# Patient Record
Sex: Female | Born: 1940 | Race: White | Hispanic: No | Smoking: Former smoker
Health system: Southern US, Community
[De-identification: ages and names within clinical notes are randomized; demographics above are authoritative.]

## PROBLEM LIST (undated history)

## (undated) DIAGNOSIS — C50919 Malignant neoplasm of unspecified site of unspecified female breast: Secondary | ICD-10-CM

## (undated) DIAGNOSIS — I4892 Unspecified atrial flutter: Secondary | ICD-10-CM

## (undated) DIAGNOSIS — I214 Non-ST elevation (NSTEMI) myocardial infarction: Secondary | ICD-10-CM

## (undated) DIAGNOSIS — I499 Cardiac arrhythmia, unspecified: Secondary | ICD-10-CM

## (undated) DIAGNOSIS — Z95820 Peripheral vascular angioplasty status with implants and grafts: Secondary | ICD-10-CM

## (undated) DIAGNOSIS — I2510 Atherosclerotic heart disease of native coronary artery without angina pectoris: Secondary | ICD-10-CM

## (undated) DIAGNOSIS — I10 Essential (primary) hypertension: Secondary | ICD-10-CM

## (undated) DIAGNOSIS — I4891 Unspecified atrial fibrillation: Secondary | ICD-10-CM

## (undated) DIAGNOSIS — J449 Chronic obstructive pulmonary disease, unspecified: Secondary | ICD-10-CM

## (undated) HISTORY — PX: EYE SURGERY: SHX253

## (undated) HISTORY — PX: APPENDECTOMY: SHX54

## (undated) HISTORY — PX: COLONOSCOPY: SHX174

## (undated) HISTORY — PX: BREAST SURGERY: SHX581

---

## 2013-08-02 ENCOUNTER — Emergency Department (HOSPITAL_COMMUNITY): Payer: Medicare Other

## 2013-08-02 ENCOUNTER — Emergency Department (HOSPITAL_COMMUNITY)
Admission: EM | Admit: 2013-08-02 | Discharge: 2013-08-03 | Disposition: A | Discharge status: Home / Self Care | Payer: Medicare Other | Attending: Emergency Medicine | Admitting: Emergency Medicine

## 2013-08-02 ENCOUNTER — Encounter (HOSPITAL_COMMUNITY): Payer: Self-pay | Admitting: Emergency Medicine

## 2013-08-02 DIAGNOSIS — J449 Chronic obstructive pulmonary disease, unspecified: Secondary | ICD-10-CM

## 2013-08-02 DIAGNOSIS — Z853 Personal history of malignant neoplasm of breast: Secondary | ICD-10-CM | POA: Insufficient documentation

## 2013-08-02 DIAGNOSIS — I10 Essential (primary) hypertension: Secondary | ICD-10-CM | POA: Insufficient documentation

## 2013-08-02 DIAGNOSIS — Z792 Long term (current) use of antibiotics: Secondary | ICD-10-CM | POA: Insufficient documentation

## 2013-08-02 DIAGNOSIS — J441 Chronic obstructive pulmonary disease with (acute) exacerbation: Secondary | ICD-10-CM | POA: Insufficient documentation

## 2013-08-02 DIAGNOSIS — F17200 Nicotine dependence, unspecified, uncomplicated: Secondary | ICD-10-CM | POA: Insufficient documentation

## 2013-08-02 DIAGNOSIS — Z791 Long term (current) use of non-steroidal anti-inflammatories (NSAID): Secondary | ICD-10-CM | POA: Insufficient documentation

## 2013-08-02 DIAGNOSIS — Z79899 Other long term (current) drug therapy: Secondary | ICD-10-CM | POA: Insufficient documentation

## 2013-08-02 HISTORY — DX: Malignant neoplasm of unspecified site of unspecified female breast: C50.919

## 2013-08-02 HISTORY — DX: Essential (primary) hypertension: I10

## 2013-08-02 LAB — CBC WITH DIFFERENTIAL/PLATELET
Basophils Absolute: 0 10*3/uL (ref 0.0–0.1)
Basophils Relative: 1 % (ref 0–1)
Eosinophils Absolute: 0.3 10*3/uL (ref 0.0–0.7)
Eosinophils Relative: 5 % (ref 0–5)
HCT: 40.3 % (ref 36.0–46.0)
Hemoglobin: 13.8 g/dL (ref 12.0–15.0)
Lymphocytes Relative: 29 % (ref 12–46)
Lymphs Abs: 1.8 10*3/uL (ref 0.7–4.0)
MCH: 30.7 pg (ref 26.0–34.0)
MCHC: 34.2 g/dL (ref 30.0–36.0)
MCV: 89.8 fL (ref 78.0–100.0)
Monocytes Absolute: 0.5 10*3/uL (ref 0.1–1.0)
Monocytes Relative: 7 % (ref 3–12)
Neutro Abs: 3.6 10*3/uL (ref 1.7–7.7)
Neutrophils Relative %: 58 % (ref 43–77)
Platelets: 183 10*3/uL (ref 150–400)
RBC: 4.49 MIL/uL (ref 3.87–5.11)
RDW: 13.3 % (ref 11.5–15.5)
WBC: 6.3 10*3/uL (ref 4.0–10.5)

## 2013-08-02 LAB — BASIC METABOLIC PANEL
BUN: 22 mg/dL (ref 6–23)
CO2: 21 mEq/L (ref 19–32)
Calcium: 8.9 mg/dL (ref 8.4–10.5)
Chloride: 101 mEq/L (ref 96–112)
Creatinine, Ser: 1 mg/dL (ref 0.50–1.10)
GFR calc Af Amer: 65 mL/min — ABNORMAL LOW (ref >90)
GFR calc non Af Amer: 56 mL/min — ABNORMAL LOW (ref >90)
Glucose, Bld: 130 mg/dL — ABNORMAL HIGH (ref 70–99)
Potassium: 3.9 mEq/L (ref 3.7–5.3)
Sodium: 134 mEq/L — ABNORMAL LOW (ref 137–147)

## 2013-08-02 MED ORDER — ALBUTEROL SULFATE (2.5 MG/3ML) 0.083% IN NEBU: 2.5000 mg | INHALATION_SOLUTION | Freq: Once | RESPIRATORY_TRACT | Status: DC

## 2013-08-02 MED ORDER — PREDNISONE 50 MG PO TABS
60.0000 mg | ORAL_TABLET | Freq: Once | ORAL | Status: AC
  Administered 2013-08-02: 60 mg via ORAL
  Filled 2013-08-02 (×2): qty 1

## 2013-08-02 MED ORDER — IPRATROPIUM BROMIDE 0.02 % IN SOLN: 0.5000 mg | Freq: Once | RESPIRATORY_TRACT | Status: DC

## 2013-08-02 MED ORDER — IPRATROPIUM-ALBUTEROL 0.5-2.5 (3) MG/3ML IN SOLN
3.0000 mL | Freq: Once | RESPIRATORY_TRACT | Status: AC
  Administered 2013-08-02: 3 mL via RESPIRATORY_TRACT
  Filled 2013-08-02: qty 3

## 2013-08-02 MED ORDER — ALBUTEROL (5 MG/ML) CONTINUOUS INHALATION SOLN
10.0000 mg/h | INHALATION_SOLUTION | Freq: Once | RESPIRATORY_TRACT | Status: AC
  Administered 2013-08-03: 10 mg/h via RESPIRATORY_TRACT
  Filled 2013-08-02: qty 20

## 2013-08-02 NOTE — ED Provider Notes (Signed)
CSN: [id number]     Arrival date & time 08/02/13  2202 History  This chart was scribed for Sharyon Cable, MD by Ladene Artist, ED Scribe. The patient was seen in room APA03/APA03. Patient's care was started at 11:14 PM.    Chief Complaint  Patient presents with  . Cough  . Shortness of Breath    Patient is a 73 y.o. female presenting with cough and shortness of breath. The history is provided by the patient. No language interpreter was used.  Cough Cough characteristics:  Unable to specify Severity:  Moderate Onset quality:  Gradual Duration:  1 week Progression:  Worsening Chronicity:  Recurrent Smoker: yes   Ineffective treatments:  None tried Associated symptoms: shortness of breath   Associated symptoms: no chest pain and no fever   Shortness of Breath Associated symptoms: cough   Associated symptoms: no chest pain, no fever and no vomiting    HPI Comments: Patricia Jackson is a 72 y.o. female who presents to the Emergency Department complaining of gradually worsening cough since last week. Pt also reports associated SOB. Pt denies fever, vomiting, chest pain, hemoptysis and leg swelling. Pt was given a breathing treatment with mild relief. Pt is currently taking a Zpak.   Pt is a smoker. She reports quitting today.  No h/o asthma or COPD.  PCP Delphina Cahill; last seen Tuesday.  Past Medical History  Diagnosis Date  . Hypertension   . Breast cancer    Past Surgical History  Procedure Laterality Date  . Appendectomy    . Breast surgery     No family history on file. History  Substance Use Topics  . Smoking status: Current Every Day Smoker  . Smokeless tobacco: Not on file  . Alcohol Use: No   OB History   Grav Para Term Preterm Abortions TAB SAB Ect Mult Living                 Review of Systems  Constitutional: Negative for fever.  Respiratory: Positive for cough and shortness of breath.   Cardiovascular: Negative for chest pain and leg swelling.   Gastrointestinal: Negative for vomiting.  All other systems reviewed and are negative.   Allergies  Sulfa antibiotics  Home Medications   Prior to Admission medications   Medication Sig Start Date End Date Taking? Authorizing Provider  amLODipine (NORVASC) 10 MG tablet Take 10 mg by mouth daily. 06/26/13  Yes Historical Provider, MD  azithromycin (ZITHROMAX) 250 MG tablet Take 250-500 mg by mouth daily. Take 2 tablets on day 1 and 1 tablet on days 2-5 07/31/13  Yes Historical Provider, MD  butalbital-acetaminophen-caffeine (FIORICET, ESGIC) 50-325-40 MG per tablet Take 1 tablet by mouth 2 (two) times daily as needed for headache.   Yes Historical Provider, MD  dextromethorphan-guaiFENesin (MUCINEX DM) 30-600 MG per 12 hr tablet Take 1 tablet by mouth 2 (two) times daily.   Yes Historical Provider, MD  HYDROcodone-homatropine (HYCODAN) 5-1.5 MG/5ML syrup Take 5 mLs by mouth every 6 (six) hours as needed for cough.   Yes Historical Provider, MD  losartan (COZAAR) 100 MG tablet Take 100 mg by mouth daily. 07/24/13  Yes Historical Provider, MD  oxyCODONE-acetaminophen (PERCOCET/ROXICET) 5-325 MG per tablet Take 1 tablet by mouth every 6 (six) hours as needed. 06/27/13  Yes Historical Provider, MD   Triage Vitals: BP 150/59  Pulse 74  Temp(Src) 97.6 F (36.4 C) (Oral)  Resp 24  Ht 5\' 6"  (1.676 m)  Wt 145 lb (65.772  kg)  BMI 23.41 kg/m2  SpO2 92% Physical Exam CONSTITUTIONAL: Well developed/well nourished HEAD: Normocephalic/atraumatic EYES: EOMI/PERRL ENMT: Mucous membranes moist NECK: supple no meningeal signs SPINE:entire spine nontender CV: S1/S2 noted, no murmurs/rubs/gallops noted LUNGS: no apparent distress, wheezing bilaterally ABDOMEN: soft, nontender, no rebound or guarding GU:no cva tenderness NEURO: Pt is awake/alert, moves all extremitiesx4 EXTREMITIES: pulses normal, full ROM SKIN: warm, color normal PSYCH: no abnormalities of mood noted  ED Course   Procedures DIAGNOSTIC STUDIES: Oxygen Saturation is 95% on RA, adequate by my interpretation.    COORDINATION OF CARE: 11:16 PM-Discussed treatment plan which includes CXR with pt at bedside and pt agreed to plan.  1:32 AM Pt improved, ambulatory, no distress Stable for d/c home Labs Review Labs Reviewed  BASIC METABOLIC PANEL - Abnormal; Notable for the following:    Sodium 134 (*)    Glucose, Bld 130 (*)    GFR calc non Af Amer 56 (*)    GFR calc Af Amer 65 (*)    All other components within normal limits  CBC WITH DIFFERENTIAL    Imaging Review Dg Chest 2 View  08/03/2013   CLINICAL DATA:  Cough and shortness of breath.  History of smoking.  EXAM: CHEST  2 VIEW  COMPARISON:  None.  FINDINGS: The lungs are hyperexpanded, with flattening of the hemidiaphragms, compatible with COPD. There is no evidence of focal opacification, pleural effusion or pneumothorax. A few tiny calcified granulomata are seen at the right lung apex.  The heart is normal in size; the mediastinal contour is within normal limits. No acute osseous abnormalities are seen. Clips are seen overlying the left axilla.  IMPRESSION: Findings of COPD; no acute cardiopulmonary process seen.   Electronically Signed   By: Garald Balding M.D.   On: 08/03/2013 00:53     Date: 08/02/2013  Rate: 68  Rhythm: normal sinus rhythm  QRS Axis: normal  Intervals: normal  ST/T Wave abnormalities: normal  Conduction Disutrbances:none  Narrative Interpretation:   Old EKG Reviewed: none available    MDM   Final diagnoses:  COPD (chronic obstructive pulmonary disease)    Nursing notes including past medical history and social history reviewed and considered in documentation xrays reviewed and considered Labs/vital reviewed and considered   I personally performed the services described in this documentation, which was scribed in my presence. The recorded information has been reviewed and is accurate.    Sharyon Cable, MD 08/03/13 [phone]

## 2013-08-02 NOTE — ED Notes (Signed)
Pt reports seeing her PCP last week for bronchitis. States the cough seems to be getting worse over the last couple of days, making her SOB. Reports coughing up small amounts of white phlegm.

## 2013-08-02 NOTE — ED Notes (Signed)
Pt reporting cough and SOB.  Reports that she saw her PCP on Tuesday, diagnosed with bronchitis and was given cough syrup, antibiotics and instructed to take Mucinex.  Pt reports no improvement since that time.  Mild expiratory wheezes noted in right lung fields.

## 2013-08-03 MED ORDER — AEROCHAMBER PLUS FLO-VU MEDIUM MISC
1.0000 | Freq: Once | Status: AC
  Administered 2013-08-03: 1

## 2013-08-03 MED ORDER — PREDNISONE 50 MG PO TABS: ORAL_TABLET | ORAL | Status: DC

## 2013-08-03 MED ORDER — ALBUTEROL SULFATE HFA 108 (90 BASE) MCG/ACT IN AERS
2.0000 | INHALATION_SPRAY | Freq: Once | RESPIRATORY_TRACT | Status: AC
  Administered 2013-08-03: 2 via RESPIRATORY_TRACT
  Filled 2013-08-03: qty 6.7

## 2013-08-03 NOTE — Discharge Instructions (Signed)
Chronic Obstructive Pulmonary Disease  Chronic obstructive pulmonary disease (COPD) is a common lung condition in which airflow from the lungs is limited. COPD is a general term that can be used to describe many different lung problems that limit airflow, including both chronic bronchitis and emphysema.  If you have COPD, your lung function will probably never return to normal, but there are measures you can take to improve lung function and make yourself feel better.   CAUSES   · Smoking (common).    · Exposure to secondhand smoke.    · Genetic problems.  · Chronic inflammatory lung diseases or recurrent infections.  SYMPTOMS   · Shortness of breath, especially with physical activity.    · Deep, persistent (chronic) cough with a large amount of thick mucus.    · Wheezing.    · Rapid breaths (tachypnea).    · Gray or bluish discoloration (cyanosis) of the skin, especially in fingers, toes, or lips.    · Fatigue.    · Weight loss.    · Frequent infections or episodes when breathing symptoms become much worse (exacerbations).    · Chest tightness.  DIAGNOSIS   Your healthcare provider will take a medical history and perform a physical examination to make the initial diagnosis.  Additional tests for COPD may include:   · Lung (pulmonary) function tests.  · Chest X-ray.  · CT scan.  · Blood tests.  TREATMENT   Treatment available to help you feel better when you have COPD include:   · Inhaler and nebulizer medicines. These help manage the symptoms of COPD and make your breathing more comfortable  · Supplemental oxygen. Supplemental oxygen is only helpful if you have a low oxygen level in your blood.    · Exercise and physical activity. These are beneficial for nearly all people with COPD. Some people may also benefit from a pulmonary rehabilitation program.  HOME CARE INSTRUCTIONS   · Take all medicines (inhaled or pills) as directed by your health care provider.  · Only take over-the-counter or prescription medicines  for pain, fever, or discomfort as directed by your health care provider.    · Avoid over-the-counter medicines or cough syrups that dry up your airway (such as antihistamines) and slow down the elimination of secretions unless instructed otherwise by your healthcare provider.    · If you are a smoker, the most important thing that you can do is stop smoking. Continuing to smoke will cause further lung damage and breathing trouble. Ask your health care provider for help with quitting smoking. He or she can direct you to community resources or hospitals that provide support.  · Avoid exposure to irritants such as smoke, chemicals, and fumes that aggravate your breathing.  · Use oxygen therapy and pulmonary rehabilitation if directed by your health care provider. If you require home oxygen therapy, ask your healthcare provider whether you should purchase a pulse oximeter to measure your oxygen level at home.    · Avoid contact with individuals who have a contagious illness.  · Avoid extreme temperature and humidity changes.  · Eat healthy foods. Eating smaller, more frequent meals and resting before meals may help you maintain your strength.  · Stay active, but balance activity with periods of rest. Exercise and physical activity will help you maintain your ability to do things you want to do.  · Preventing infection and hospitalization is very important when you have COPD. Make sure to receive all the vaccines your health care provider recommends, especially the pneumococcal and influenza vaccines. Ask your healthcare provider whether you   need a pneumonia vaccine.  · Learn and use relaxation techniques to manage stress.  · Learn and use controlled breathing techniques as directed by your health care provider. Controlled breathing techniques include:    · Pursed lip breathing. Start by breathing in (inhaling) through your nose for 1 second. Then, purse your lips as if you were going to whistle and breathe out (exhale)  through the pursed lips for 2 seconds.    · Diaphragmatic breathing. Start by putting one hand on your abdomen just above your waist. Inhale slowly through your nose. The hand on your abdomen should move out. Then purse your lips and exhale slowly. You should be able to feel the hand on your abdomen moving in as you exhale.    · Learn and use controlled coughing to clear mucus from your lungs. Controlled coughing is a series of short, progressive coughs. The steps of controlled coughing are:    1. Lean your head slightly forward.    2. Breathe in deeply using diaphragmatic breathing.    3. Try to hold your breath for 3 seconds.    4. Keep your mouth slightly open while coughing twice.    5. Spit any mucus out into a tissue.    6. Rest and repeat the steps once or twice as needed.  SEEK MEDICAL CARE IF:   · You are coughing up more mucus than usual.    · There is a change in the color or thickness of your mucus.    · Your breathing is more labored than usual.    · Your breathing is faster than usual.    SEEK IMMEDIATE MEDICAL CARE IF:   · You have shortness of breath while you are resting.    · You have shortness of breath that prevents you from:  · Being able to talk.    · Performing your usual physical activities.    · You have chest pain lasting longer than 5 minutes.    · Your skin color is more cyanotic than usual.  · You measure low oxygen saturations for longer than 5 minutes with a pulse oximeter.  MAKE SURE YOU:   · Understand these instructions.  · Will watch your condition.  · Will get help right away if you are not doing well or get worse.  Document Released: 12/30/2004 Document Revised: 01/10/2013 Document Reviewed: 11/16/2012  [employer]® Patient Information ©2014 [employer].

## 2013-08-03 NOTE — ED Notes (Signed)
Pt ambulated around nurses station with no difficulty.  No complaints of dizziness or increased SOB.

## 2013-11-21 ENCOUNTER — Other Ambulatory Visit (HOSPITAL_COMMUNITY): Payer: Self-pay | Admitting: Respiratory Therapy

## 2013-11-21 DIAGNOSIS — J441 Chronic obstructive pulmonary disease with (acute) exacerbation: Secondary | ICD-10-CM

## 2013-11-26 ENCOUNTER — Encounter (HOSPITAL_COMMUNITY): Payer: Self-pay | Admitting: Emergency Medicine

## 2013-11-26 ENCOUNTER — Emergency Department (HOSPITAL_COMMUNITY): Payer: Medicare Other

## 2013-11-26 ENCOUNTER — Observation Stay (HOSPITAL_COMMUNITY)
Admission: EM | Admit: 2013-11-26 | Discharge: 2013-11-26 | Disposition: A | Discharge status: Home / Self Care | Payer: Medicare Other | Attending: Family Medicine | Admitting: Family Medicine

## 2013-11-26 DIAGNOSIS — R079 Chest pain, unspecified: Secondary | ICD-10-CM | POA: Diagnosis present

## 2013-11-26 DIAGNOSIS — R0609 Other forms of dyspnea: Secondary | ICD-10-CM

## 2013-11-26 DIAGNOSIS — M546 Pain in thoracic spine: Secondary | ICD-10-CM | POA: Diagnosis not present

## 2013-11-26 DIAGNOSIS — I059 Rheumatic mitral valve disease, unspecified: Secondary | ICD-10-CM

## 2013-11-26 DIAGNOSIS — R0602 Shortness of breath: Secondary | ICD-10-CM | POA: Diagnosis present

## 2013-11-26 DIAGNOSIS — F17200 Nicotine dependence, unspecified, uncomplicated: Secondary | ICD-10-CM | POA: Diagnosis not present

## 2013-11-26 DIAGNOSIS — R0600 Dyspnea, unspecified: Secondary | ICD-10-CM

## 2013-11-26 DIAGNOSIS — M79601 Pain in right arm: Secondary | ICD-10-CM

## 2013-11-26 DIAGNOSIS — Z79899 Other long term (current) drug therapy: Secondary | ICD-10-CM | POA: Diagnosis not present

## 2013-11-26 DIAGNOSIS — M549 Dorsalgia, unspecified: Secondary | ICD-10-CM

## 2013-11-26 DIAGNOSIS — I10 Essential (primary) hypertension: Secondary | ICD-10-CM

## 2013-11-26 DIAGNOSIS — M25519 Pain in unspecified shoulder: Secondary | ICD-10-CM | POA: Diagnosis not present

## 2013-11-26 DIAGNOSIS — J441 Chronic obstructive pulmonary disease with (acute) exacerbation: Secondary | ICD-10-CM | POA: Insufficient documentation

## 2013-11-26 DIAGNOSIS — R42 Dizziness and giddiness: Secondary | ICD-10-CM | POA: Insufficient documentation

## 2013-11-26 DIAGNOSIS — R0989 Other specified symptoms and signs involving the circulatory and respiratory systems: Secondary | ICD-10-CM

## 2013-11-26 DIAGNOSIS — M79609 Pain in unspecified limb: Secondary | ICD-10-CM

## 2013-11-26 DIAGNOSIS — M79603 Pain in arm, unspecified: Secondary | ICD-10-CM

## 2013-11-26 DIAGNOSIS — Z853 Personal history of malignant neoplasm of breast: Secondary | ICD-10-CM | POA: Diagnosis not present

## 2013-11-26 LAB — COMPREHENSIVE METABOLIC PANEL
ALT: 11 U/L (ref 0–35)
AST: 29 U/L (ref 0–37)
Albumin: 4 g/dL (ref 3.5–5.2)
Alkaline Phosphatase: 86 U/L (ref 39–117)
Anion gap: 15 (ref 5–15)
BUN: 25 mg/dL — ABNORMAL HIGH (ref 6–23)
CO2: 21 mEq/L (ref 19–32)
Calcium: 9.6 mg/dL (ref 8.4–10.5)
Chloride: 102 mEq/L (ref 96–112)
Creatinine, Ser: 1.03 mg/dL (ref 0.50–1.10)
GFR calc Af Amer: 62 mL/min — ABNORMAL LOW (ref >90)
GFR calc non Af Amer: 54 mL/min — ABNORMAL LOW (ref >90)
Glucose, Bld: 119 mg/dL — ABNORMAL HIGH (ref 70–99)
Potassium: 4.1 mEq/L (ref 3.7–5.3)
Sodium: 138 mEq/L (ref 137–147)
Total Bilirubin: 0.3 mg/dL (ref 0.3–1.2)
Total Protein: 7.8 g/dL (ref 6.0–8.3)

## 2013-11-26 LAB — CBC WITH DIFFERENTIAL/PLATELET
Basophils Absolute: 0 10*3/uL (ref 0.0–0.1)
Basophils Relative: 1 % (ref 0–1)
Eosinophils Absolute: 0.4 10*3/uL (ref 0.0–0.7)
Eosinophils Relative: 6 % — ABNORMAL HIGH (ref 0–5)
HCT: 44.1 % (ref 36.0–46.0)
Hemoglobin: 15.4 g/dL — ABNORMAL HIGH (ref 12.0–15.0)
Lymphocytes Relative: 37 % (ref 12–46)
Lymphs Abs: 2.7 10*3/uL (ref 0.7–4.0)
MCH: 31.4 pg (ref 26.0–34.0)
MCHC: 34.9 g/dL (ref 30.0–36.0)
MCV: 90 fL (ref 78.0–100.0)
Monocytes Absolute: 0.5 10*3/uL (ref 0.1–1.0)
Monocytes Relative: 7 % (ref 3–12)
Neutro Abs: 3.7 10*3/uL (ref 1.7–7.7)
Neutrophils Relative %: 49 % (ref 43–77)
Platelets: 183 10*3/uL (ref 150–400)
RBC: 4.9 MIL/uL (ref 3.87–5.11)
RDW: 14.5 % (ref 11.5–15.5)
WBC: 7.4 10*3/uL (ref 4.0–10.5)

## 2013-11-26 LAB — TROPONIN I
Troponin I: <0.3 ng/mL (ref <0.30)
Troponin I: <0.3 ng/mL (ref <0.30)
Troponin I: <0.3 ng/mL (ref <0.30)
Troponin I: <0.3 ng/mL (ref <0.30)

## 2013-11-26 LAB — PRO B NATRIURETIC PEPTIDE: Pro B Natriuretic peptide (BNP): 119.5 pg/mL (ref 0–125)

## 2013-11-26 MED ORDER — ASPIRIN EC 325 MG PO TBEC
325.0000 mg | DELAYED_RELEASE_TABLET | Freq: Every day | ORAL | Status: DC
  Filled 2013-11-26: qty 1

## 2013-11-26 MED ORDER — FENTANYL CITRATE 0.05 MG/ML IJ SOLN
50.0000 ug | INTRAMUSCULAR | Status: DC | PRN
  Administered 2013-11-26: 50 ug via INTRAVENOUS
  Filled 2013-11-26: qty 2

## 2013-11-26 MED ORDER — SODIUM CHLORIDE 0.9 % IV BOLUS (SEPSIS)
500.0000 mL | Freq: Once | INTRAVENOUS | Status: AC
  Administered 2013-11-26: 1000 mL via INTRAVENOUS

## 2013-11-26 MED ORDER — ASPIRIN 325 MG PO TBEC: 325.0000 mg | DELAYED_RELEASE_TABLET | Freq: Every day | ORAL | Status: DC

## 2013-11-26 MED ORDER — GI COCKTAIL ~~LOC~~: 30.0000 mL | Freq: Four times a day (QID) | ORAL | Status: DC | PRN

## 2013-11-26 MED ORDER — ASPIRIN 81 MG PO CHEW
324.0000 mg | CHEWABLE_TABLET | Freq: Once | ORAL | Status: AC
  Administered 2013-11-26: 324 mg via ORAL
  Filled 2013-11-26: qty 4

## 2013-11-26 MED ORDER — ONDANSETRON HCL 4 MG/2ML IJ SOLN: 4.0000 mg | Freq: Four times a day (QID) | INTRAMUSCULAR | Status: DC | PRN

## 2013-11-26 MED ORDER — MORPHINE SULFATE 4 MG/ML IJ SOLN: 4.0000 mg | Freq: Once | INTRAMUSCULAR | Status: DC

## 2013-11-26 MED ORDER — IOHEXOL 350 MG/ML SOLN
100.0000 mL | Freq: Once | INTRAVENOUS | Status: AC | PRN
  Administered 2013-11-26: 100 mL via INTRAVENOUS

## 2013-11-26 MED ORDER — ACETAMINOPHEN 325 MG PO TABS: 650.0000 mg | ORAL_TABLET | ORAL | Status: DC | PRN

## 2013-11-26 MED ORDER — ONDANSETRON HCL 4 MG/2ML IJ SOLN: 4.0000 mg | Freq: Three times a day (TID) | INTRAMUSCULAR | Status: DC | PRN

## 2013-11-26 NOTE — Progress Notes (Signed)
Patients home medications not ordered. Dyanne Carrel notified.

## 2013-11-26 NOTE — Discharge Summary (Signed)
Physician Discharge Summary  Patricia Jackson [id number] DOB: 1941/03/27 DOA: 11/26/2013  PCP: Delphina Cahill, MD  Admit date: 11/26/2013 Discharge date: 11/26/2013  Time spent: 40 minutes  Recommendations for Outpatient Follow-up:  1. Dr. Harl Bowie cardiology 12/14/13 to follow chest/back pain radiating to both arms. Evaluate need for stress test.  2. Follow up with PCP 1-2 weeks for evaluation of neck/arm pain. May benefit from c-spine image. Follow chest xray and respiratory effort.   Discharge Diagnoses:  Principal Problem:   Chest pain Active Problems:   Dyspnea   Hypertension   Back pain   Arm pain   Discharge Condition: stable  Diet recommendation: heart healthy  Filed Weights   11/26/13 0053 11/26/13 0440  Weight: 58.968 kg (130 lb) 60.8 kg (134 lb 0.6 oz)    History of present illness:  73 yo female h/o htn comes in to Ed on 11/26/13 with sudden onset of back pain with radiation down both of her arms that woke her up about midnight. For a day or so she had been getting  sharp pains like  that usually go away, but on the night of admission the pain persisted and was severe. She did not recall if it started in her right elbow or not. But she thought it started in her right elbow than went up to her upper back and left arm. No chest pain. No recent injuries or trauma. The pain was gone during exam in ED. No recent heavy lifting. No neck injury. No cough or fevers. No le edema or swelling. She was admitted for observation to evaluate for  acs.  Hospital Course:  Back pain/chest/arm pain- Ekg with no acute changes and trop neg x 3. Echo EF 92-42%, grade 1 diastolic dysfunction, mild mitral regurgitation, normal wall motion. BNP normal. CT of chest negative for PE. No further episodes of pain. At discharge pain free. Will be discharged on aspirin and she has follow up appointment with cardiology 12/14/13. May benefit from OP stress test.    Active Problems:  Dyspnea: resolved at  discharge. Oxygen saturation level within the limits of normal on room air.  She is a smoker. Ct with evidence of emphysematous changes. Recommend OP follow up with PCP  Hypertension: controlled. Continue home amlodipine and cozaar  Arm pain: etiology unclear. Resolved at discharge. Recommend follow up with PCP for further evaluation. May consider c-spine image if reoccurs   Procedures: 2 decho: Upper normal LV wall thickness with LVEF 68-34%, grade 1 diastolic dysfunction with increased filling pressures. Mild mitral regurgitation. Trivial tricuspid regurgitation with normal PASP 28 mmHg.  Consultations:  none  Discharge Exam: Filed Vitals:   11/26/13 0440  BP: 124/61  Pulse: 54  Temp: 97.6 F (36.4 C)  Resp: 20    General: well nourished NAD Cardiovascular: RRR No MGR no LE edema PPP Respiratory: normal effort BS somewhat distant but clear. No wheeze no crackles MS: joints without swelling. Neck with full ROM, non-tender. Bilateral UE strength 5/5  Discharge Instructions You were cared for by a hospitalist during your hospital stay. If you have any questions about your discharge medications or the care you received while you were in the hospital after you are discharged, you can call the unit and asked to speak with the hospitalist on call if the hospitalist that took care of you is not available. Once you are discharged, your primary care physician will handle any further medical issues. Please note that NO REFILLS for any discharge medications will be  authorized once you are discharged, as it is imperative that you return to your primary care physician (or establish a relationship with a primary care physician if you do not have one) for your aftercare needs so that they can reassess your need for medications and monitor your lab values.  Discharge Instructions   Call MD for:  severe uncontrolled pain    Complete by:  As directed      Diet - low sodium heart healthy     Complete by:  As directed      Discharge instructions    Complete by:  As directed   Take medications as directed. Continue aspirin until evaluated by Dr. Harl Bowie with cardiology on 12/14/13. Follow heart healthy diet.     Increase activity slowly    Complete by:  As directed             Medication List         amLODipine 10 MG tablet  Commonly known as:  NORVASC  Take 10 mg by mouth daily.     aspirin 325 MG EC tablet  Take 1 tablet (325 mg total) by mouth daily.     butalbital-acetaminophen-caffeine 50-325-40 MG per tablet  Commonly known as:  FIORICET, ESGIC  Take 1 tablet by mouth 2 (two) times daily as needed for headache.     dextromethorphan-guaiFENesin 30-600 MG per 12 hr tablet  Commonly known as:  MUCINEX DM  Take 1 tablet by mouth 2 (two) times daily.     diphenhydrAMINE 25 MG tablet  Commonly known as:  SOMINEX  Take 25 mg by mouth at bedtime as needed for sleep.     losartan 100 MG tablet  Commonly known as:  COZAAR  Take 100 mg by mouth daily.       Allergies  Allergen Reactions  . Sulfa Antibiotics Hives       Follow-up Information   Follow up with Delphina Cahill, MD. Schedule an appointment as soon as possible for a visit in 1 week. (for follow up on neck/back pain. )    Specialty:  Internal Medicine   Contact information:    [place] [address] [phone]       Follow up with Arnoldo Lenis, MD On 12/14/2013. (appointment at 9:20am)    Specialty:  Cardiology   Contact information:   [address] [place] [address] [phone]        The results of significant diagnostics from this hospitalization (including imaging, microbiology, ancillary and laboratory) are listed below for reference.    Significant Diagnostic Studies: Dg Chest 2 View  11/26/2013   CLINICAL DATA:  Shortness of breath. Arm pain. History of breast cancer.  EXAM: CHEST  2 VIEW  COMPARISON:  08/02/2013.  FINDINGS: The heart remains normal in  size. The lungs remain clear. The lungs remain hyperexpanded with mild diffuse prominence of the interstitial markings and mild central peribronchial thickening. Diffuse osteopenia. Left axillary surgical clips and surgical absence of the left breast are again demonstrated. Mild thoracic spine degenerative changes.  IMPRESSION: 1. No acute abnormality. 2. Stable changes of COPD and chronic bronchitis.   Electronically Signed   By: Enrique Sack M.D.   On: 11/26/2013 01:41   Ct Angio Chest Aorta W/cm &/or Wo/cm  11/26/2013   CLINICAL DATA:  Shortness of breath and chest pain radiating to between the shoulder blades.  EXAM: CT ANGIOGRAPHY CHEST WITH CONTRAST  TECHNIQUE: Multidetector CT imaging of  the chest was performed using the standard protocol during bolus administration of intravenous contrast. Multiplanar CT image reconstructions and MIPs were obtained to evaluate the vascular anatomy.  CONTRAST:  187m OMNIPAQUE IOHEXOL 350 MG/ML SOLN  COMPARISON:  Chest 11/26/2013  FINDINGS: Noncontrast imaging demonstrates calcification in the aorta. No evidence of intramural hematoma.  Arterial phase contrast-enhanced images demonstrate no evidence of aneurysm or dissection of the aorta. There is somewhat irregular calcific and noncalcific plaque formation demonstrated consistent with atherosclerotic disease. Central pulmonary arteries are well opacified without evidence of pulmonary embolus. Normal heart size.  Esophagus is decompressed. No significant lymphadenopathy in the chest. Thyroid gland is unremarkable. Visualized upper abdominal structures are intact. Probable small cysts in the upper pole left kidney. Scattered interstitial changes and cystic emphysematous changes in the lungs. No focal airspace disease or consolidation. No pleural effusion or thickening. No pneumothorax. No destructive bone lesions. Degenerative changes in the thoracic spine.  Review of the MIP images confirms the above findings.  IMPRESSION:  No evidence of aortic dissection or aneurysm. No pulmonary embolus. 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   Electronically Signed   By: WLucienne CapersM.D.   On: 11/26/2013 02:52    Microbiology: No results found for this or any previous visit (from the past 240 hour(s)).   Labs: Basic Metabolic Panel:  Recent Labs Lab 11/26/13 0116  NA 138  K 4.1  CL 102  CO2 21  GLUCOSE 119*  BUN 25*  CREATININE 1.03  CALCIUM 9.6   Liver Function Tests:  Recent Labs Lab 11/26/13 0116  AST 29  ALT 11  ALKPHOS 86  BILITOT 0.3  PROT 7.8  ALBUMIN 4.0   No results found for this basename: LIPASE, AMYLASE,  in the last 168 hours No results found for this basename: AMMONIA,  in the last 168 hours CBC:  Recent Labs Lab 11/26/13 0116  WBC 7.4  NEUTROABS 3.7  HGB 15.4*  HCT 44.1  MCV 90.0  PLT 183   Cardiac Enzymes:  Recent Labs Lab 11/26/13 0116 11/26/13 0619 11/26/13 1000  TROPONINI <0.30 <0.30 <0.30   BNP: BNP (last 3 results)  Recent Labs  11/26/13 0116  PROBNP 119.5   CBG: No results found for this basename: GLUCAP,  in the last 168 hours     Signed:  [facility] 11/26/2013, 1:28 PM

## 2013-11-26 NOTE — ED Provider Notes (Addendum)
CSN: [id number]     Arrival date & time 11/26/13  0036 History   First MD Initiated Contact with Patient 11/26/13 0054     Chief Complaint  Patient presents with  . Shortness of Breath  . Arm Pain     (Consider location/radiation/quality/duration/timing/severity/associated sxs/prior Treatment) HPI Comments: 73 year old female with history of COPD, smoker, high blood pressure presents with dyspnea and pain upper back down both shoulders and arms in between shoulder blades that woke her from sleep. Patient has had intermittent brief mild episodes past 24 hours however this was severe and now constant. Patient denies history of significant similar presentations. Patient denies cardiac or blood clot history. No aneurysm history. Patient denies anterior chest pain or pressure her feels the back and arms is a heaviness. No diaphoresis or exertional symptoms. No abdominal pain or leg symptoms. No headache or neuro symptoms. No history of cervical disc disease or arm weakness.  Patient is a 73 y.o. female presenting with shortness of breath and arm pain. The history is provided by the patient.  Shortness of Breath Associated symptoms: no abdominal pain, no chest pain, no fever, no headaches, no neck pain, no rash and no vomiting   Arm Pain Associated symptoms include shortness of breath. Pertinent negatives include no chest pain, no abdominal pain and no headaches.    Past Medical History  Diagnosis Date  . Hypertension   . Breast cancer    Past Surgical History  Procedure Laterality Date  . Appendectomy    . Breast surgery     No family history on file. History  Substance Use Topics  . Smoking status: Current Every Day Smoker  . Smokeless tobacco: Not on file  . Alcohol Use: No   OB History   Grav Para Term Preterm Abortions TAB SAB Ect Mult Living                 Review of Systems  Constitutional: Positive for appetite change. Negative for fever and chills.  HENT: Negative for  congestion.   Eyes: Negative for visual disturbance.  Respiratory: Positive for shortness of breath.   Cardiovascular: Negative for chest pain.  Gastrointestinal: Negative for vomiting and abdominal pain.  Genitourinary: Negative for dysuria and flank pain.  Musculoskeletal: Positive for arthralgias. Negative for back pain, neck pain and neck stiffness.  Skin: Negative for rash.  Neurological: Positive for light-headedness. Negative for headaches.      Allergies  Sulfa antibiotics  Home Medications   Prior to Admission medications   Medication Sig Start Date End Date Taking? Authorizing Provider  amLODipine (NORVASC) 10 MG tablet Take 10 mg by mouth daily. 06/26/13  Yes Historical Provider, MD  butalbital-acetaminophen-caffeine (FIORICET, ESGIC) 50-325-40 MG per tablet Take 1 tablet by mouth 2 (two) times daily as needed for headache.   Yes Historical Provider, MD  dextromethorphan-guaiFENesin (MUCINEX DM) 30-600 MG per 12 hr tablet Take 1 tablet by mouth 2 (two) times daily.   Yes Historical Provider, MD  diphenhydrAMINE (SOMINEX) 25 MG tablet Take 25 mg by mouth at bedtime as needed for sleep.   Yes Historical Provider, MD  losartan (COZAAR) 100 MG tablet Take 100 mg by mouth daily. 07/24/13  Yes Historical Provider, MD  azithromycin (ZITHROMAX) 250 MG tablet Take 250-500 mg by mouth daily. Take 2 tablets on day 1 and 1 tablet on days 2-5 07/31/13   Historical Provider, MD  HYDROcodone-homatropine (HYCODAN) 5-1.5 MG/5ML syrup Take 5 mLs by mouth every 6 (six) hours as needed  for cough.    Historical Provider, MD  oxyCODONE-acetaminophen (PERCOCET/ROXICET) 5-325 MG per tablet Take 1 tablet by mouth every 6 (six) hours as needed. 06/27/13   Historical Provider, MD  predniSONE (DELTASONE) 50 MG tablet One tablet PO daily for 4 days 08/03/13   Sharyon Cable, MD   BP 181/93  Pulse 88  Temp(Src) 97.4 F (36.3 C) (Oral)  Resp 22  Ht 5\' 8"  (1.727 m)  Wt 130 lb (58.968 kg)  BMI 19.77  kg/m2  SpO2 99% Physical Exam  Nursing note and vitals reviewed. Constitutional: She is oriented to person, place, and time. She appears well-developed and well-nourished.  HENT:  Head: Normocephalic and atraumatic.  Eyes: Conjunctivae are normal. Right eye exhibits no discharge. Left eye exhibits no discharge.  Neck: Normal range of motion. Neck supple. No tracheal deviation present.  Cardiovascular: Normal rate, regular rhythm and intact distal pulses.   Pulmonary/Chest: Effort normal and breath sounds normal.  Abdominal: Soft. She exhibits no distension. There is no tenderness. There is no guarding.  Musculoskeletal: She exhibits no edema.  Neurological: She is alert and oriented to person, place, and time.  Skin: Skin is warm. No rash noted.  Psychiatric: She has a normal mood and affect.    ED Course  Procedures (including critical care time)   EMERGENCY DEPARTMENT [place] CARDIAC EXAM "Study: Limited Ultrasound of the heart and pericardium"  INDICATIONS:Dyspnea Multiple views of the heart and pericardium were obtained in real-time with a multi-frequency probe.  PERFORMED [id number]  IMAGES ARCHIVED?: Yes  FINDINGS: No pericardial effusion, Normal contractility and Tamponade physiology absent  LIMITATIONS:  Body habitus  VIEWS USED: Subcostal 4 chamber, Parasternal long axis, Parasternal short axis and Apical 4 chamber   INTERPRETATION: Cardiac activity present, Pericardial effusioin absent, Cardiac tamponade absent and Normal contractility   Labs Review Labs Reviewed  CBC WITH DIFFERENTIAL - Abnormal; Notable for the following:    Hemoglobin 15.4 (*)    Eosinophils Relative 6 (*)    All other components within normal limits  COMPREHENSIVE METABOLIC PANEL - Abnormal; Notable for the following:    Glucose, Bld 119 (*)    BUN 25 (*)    GFR calc non Af Amer 54 (*)    GFR calc Af Amer 62 (*)    All other components within normal limits  TROPONIN I  PRO B NATRIURETIC  PEPTIDE    Imaging Review Dg Chest 2 View  11/26/2013   CLINICAL DATA:  Shortness of breath. Arm pain. History of breast cancer.  EXAM: CHEST  2 VIEW  COMPARISON:  08/02/2013.  FINDINGS: The heart remains normal in size. The lungs remain clear. The lungs remain hyperexpanded with mild diffuse prominence of the interstitial markings and mild central peribronchial thickening. Diffuse osteopenia. Left axillary surgical clips and surgical absence of the left breast are again demonstrated. Mild thoracic spine degenerative changes.  IMPRESSION: 1. No acute abnormality. 2. Stable changes of COPD and chronic bronchitis.   Electronically Signed   By: Enrique Sack M.D.   On: 11/26/2013 01:41   Ct Angio Chest Aorta W/cm &/or Wo/cm  11/26/2013   CLINICAL DATA:  Shortness of breath and chest pain radiating to between the shoulder blades.  EXAM: CT ANGIOGRAPHY CHEST WITH CONTRAST  TECHNIQUE: Multidetector CT imaging of the chest was performed using the standard protocol during bolus administration of intravenous contrast. Multiplanar CT image reconstructions and MIPs were obtained to evaluate the vascular anatomy.  CONTRAST:  152mL OMNIPAQUE IOHEXOL 350 MG/ML  SOLN  COMPARISON:  Chest 11/26/2013  FINDINGS: Noncontrast imaging demonstrates calcification in the aorta. No evidence of intramural hematoma.  Arterial phase contrast-enhanced images demonstrate no evidence of aneurysm or dissection of the aorta. There is somewhat irregular calcific and noncalcific plaque formation demonstrated consistent with atherosclerotic disease. Central pulmonary arteries are well opacified without evidence of pulmonary embolus. Normal heart size.  Esophagus is decompressed. No significant lymphadenopathy in the chest. Thyroid gland is unremarkable. Visualized upper abdominal structures are intact. Probable small cysts in the upper pole left kidney. Scattered interstitial changes and cystic emphysematous changes in the lungs. No focal airspace  disease or consolidation. No pleural effusion or thickening. No pneumothorax. No destructive bone lesions. Degenerative changes in the thoracic spine.  Review of the MIP images confirms the above findings.  IMPRESSION: No evidence of aortic dissection or aneurysm. No pulmonary embolus. 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   Electronically Signed   By: Lucienne Capers M.D.   On: 11/26/2013 02:52     EKG Interpretation   Date/Time:  Monday November 26 2013 01:00:37 EDT Ventricular Rate:  90 PR Interval:  145 QRS Duration: 94 QT Interval:  400 QTC Calculation: 489 R Axis:   89 Text Interpretation:  Sinus rhythm Multiple ventricular premature  complexes Probable left atrial enlargement ST depression V5/6 Confirmed by  Jony Ladnier  MD, Miel Wisener ([id number]) on 11/26/2013 1:06:00 AM      MDM   Final diagnoses:  Essential hypertension  Dyspnea Upper back pain  Patient with 3 cardiac risk factors presents with arm pain and upper back pressure heaviness. Discussed differential including cardiac, dissection, disc disease, MSK, other. Plan for cardiac screen, CT angina the chest, pain meds and further evaluation. Likely plan for telemetry observation.  CT chest negative for dissection or blood clot. Pain medicines given in ER, aspirin ordered. Discussed the case with [facility] hospitalist for telemetry observation, patient accepted.  The patients results and plan were reviewed and discussed.   Any x-rays performed were personally reviewed by myself.   Differential diagnosis were considered with the presenting HPI.  Medications  acetaminophen (TYLENOL) tablet 650 mg (not administered)  ondansetron (ZOFRAN) injection 4 mg (not administered)  gi cocktail (Maalox,Lidocaine,Donnatal) (not administered)  aspirin EC tablet 325 mg (not administered)  sodium chloride 0.9 % bolus 500 mL (1,000 mLs Intravenous New Bag/Given 11/26/13 0152)  iohexol  (OMNIPAQUE) 350 MG/ML injection 100 mL (100 mLs Intravenous Contrast Given 11/26/13 0215)  aspirin chewable tablet 324 mg (324 mg Oral Given 11/26/13 0339)      Filed Vitals:   11/26/13 0053 11/26/13 0056 11/26/13 0440  BP:  181/93 124/61  Pulse: 88  54  Temp: 97.4 F (36.3 C)  97.6 F (36.4 C)  TempSrc: Oral  Oral  Resp: 22  20  Height: 5\' 8"  (1.727 m)    Weight: 130 lb (58.968 kg)  134 lb 0.6 oz (60.8 kg)  SpO2: 99%  99%    Admission/ observation were discussed with the admitting physician, patient and/or family and they are comfortable with the plan.      Mariea Clonts, MD 11/26/13 [id number]  Mariea Clonts, MD 11/26/13 [phone]

## 2013-11-26 NOTE — Progress Notes (Signed)
  Echocardiogram 2D Echocardiogram has been performed.  Cra[place], [place] 11/26/2013, 11:35 AM

## 2013-11-26 NOTE — H&P (Signed)
PCP:   Delphina Cahill, MD   Chief Complaint:  Back pain  HPI: 73 yo female h/o htn comes in with sudden onset of back pain with radiation down both of her arms that woke her up about midnight.  For a day or so she has been getting these sharp pains like this that usually go away, but tonight the pain persisted and was severe.  She does not recall if it started in her right elbow or not.  But she thinks it started in her right elbow than went up to her upper back and left arm.  No chest pain.  No recent injuries or trauma.  The pain is gone now.  No recent heavy lifting.  No neck injury.  No cough or fevers.  No le edema or swelling.  We were asked to evaluate for concern of acs.  Review of Systems:  Positive and negative as per HPI otherwise all other systems are negative  Past Medical History: Past Medical History  Diagnosis Date  . Hypertension   . Breast cancer    Past Surgical History  Procedure Laterality Date  . Appendectomy    . Breast surgery      Medications: Prior to Admission medications   Medication Sig Start Date End Date Taking? Authorizing Provider  amLODipine (NORVASC) 10 MG tablet Take 10 mg by mouth daily. 06/26/13  Yes Historical Provider, MD  butalbital-acetaminophen-caffeine (FIORICET, ESGIC) 50-325-40 MG per tablet Take 1 tablet by mouth 2 (two) times daily as needed for headache.   Yes Historical Provider, MD  dextromethorphan-guaiFENesin (MUCINEX DM) 30-600 MG per 12 hr tablet Take 1 tablet by mouth 2 (two) times daily.   Yes Historical Provider, MD  diphenhydrAMINE (SOMINEX) 25 MG tablet Take 25 mg by mouth at bedtime as needed for sleep.   Yes Historical Provider, MD  losartan (COZAAR) 100 MG tablet Take 100 mg by mouth daily. 07/24/13  Yes Historical Provider, MD  azithromycin (ZITHROMAX) 250 MG tablet Take 250-500 mg by mouth daily. Take 2 tablets on day 1 and 1 tablet on days 2-5 07/31/13   Historical Provider, MD  HYDROcodone-homatropine (HYCODAN) 5-1.5  MG/5ML syrup Take 5 mLs by mouth every 6 (six) hours as needed for cough.    Historical Provider, MD  oxyCODONE-acetaminophen (PERCOCET/ROXICET) 5-325 MG per tablet Take 1 tablet by mouth every 6 (six) hours as needed. 06/27/13   Historical Provider, MD  predniSONE (DELTASONE) 50 MG tablet One tablet PO daily for 4 days 08/03/13   Sharyon Cable, MD    Allergies:   Allergies  Allergen Reactions  . Sulfa Antibiotics Hives    Social History:  reports that she has been smoking.  She does not have any smokeless tobacco history on file. She reports that she does not drink alcohol. Her drug history is not on file.  Family History: none  Physical Exam: Filed Vitals:   11/26/13 0053 11/26/13 0056  BP:  181/93  Pulse: 88   Temp: 97.4 F (36.3 C)   TempSrc: Oral   Resp: 22   Height: 5' 8"  (1.727 m)   Weight: 58.968 kg (130 lb)   SpO2: 99%    General appearance: alert, cooperative and no distress Head: Normocephalic, without obvious abnormality, atraumatic Eyes: negative Nose: Nares normal. Septum midline. Mucosa normal. No drainage or sinus tenderness. Neck: no JVD and supple, symmetrical, trachea midline  No pain with palp along cervical or thoracic spine.  No bony abnormalities.  No pain with movement active/or  passive of bue. Lungs: clear to auscultation bilaterally Heart: regular rate and rhythm, S1, S2 normal, no murmur, click, rub or gallop Abdomen: soft, non-tender; bowel sounds normal; no masses,  no organomegaly Extremities: extremities normal, atraumatic, no cyanosis or edema Pulses: 2+ and symmetric Skin: Skin color, texture, turgor normal. No rashes or lesions Neurologic: Grossly normal    Labs on Admission:   Recent Labs  11/26/13 0116  NA 138  K 4.1  CL 102  CO2 21  GLUCOSE 119*  BUN 25*  CREATININE 1.03  CALCIUM 9.6    Recent Labs  11/26/13 0116  AST 29  ALT 11  ALKPHOS 86  BILITOT 0.3  PROT 7.8  ALBUMIN 4.0    Recent Labs  11/26/13 0116   WBC 7.4  NEUTROABS 3.7  HGB 15.4*  HCT 44.1  MCV 90.0  PLT 183    Recent Labs  11/26/13 0116  TROPONINI <0.30   Radiological Exams on Admission: Dg Chest 2 View  11/26/2013   CLINICAL DATA:  Shortness of breath. Arm pain. History of breast cancer.  EXAM: CHEST  2 VIEW  COMPARISON:  08/02/2013.  FINDINGS: The heart remains normal in size. The lungs remain clear. The lungs remain hyperexpanded with mild diffuse prominence of the interstitial markings and mild central peribronchial thickening. Diffuse osteopenia. Left axillary surgical clips and surgical absence of the left breast are again demonstrated. Mild thoracic spine degenerative changes.  IMPRESSION: 1. No acute abnormality. 2. Stable changes of COPD and chronic bronchitis.   Electronically Signed   By: Enrique Sack M.D.   On: 11/26/2013 01:41   Ct Angio Chest Aorta W/cm &/or Wo/cm  11/26/2013   CLINICAL DATA:  Shortness of breath and chest pain radiating to between the shoulder blades.  EXAM: CT ANGIOGRAPHY CHEST WITH CONTRAST  TECHNIQUE: Multidetector CT imaging of the chest was performed using the standard protocol during bolus administration of intravenous contrast. Multiplanar CT image reconstructions and MIPs were obtained to evaluate the vascular anatomy.  CONTRAST:  121m OMNIPAQUE IOHEXOL 350 MG/ML SOLN  COMPARISON:  Chest 11/26/2013  FINDINGS: Noncontrast imaging demonstrates calcification in the aorta. No evidence of intramural hematoma.  Arterial phase contrast-enhanced images demonstrate no evidence of aneurysm or dissection of the aorta. There is somewhat irregular calcific and noncalcific plaque formation demonstrated consistent with atherosclerotic disease. Central pulmonary arteries are well opacified without evidence of pulmonary embolus. Normal heart size.  Esophagus is decompressed. No significant lymphadenopathy in the chest. Thyroid gland is unremarkable. Visualized upper abdominal structures are intact. Probable small  cysts in the upper pole left kidney. Scattered interstitial changes and cystic emphysematous changes in the lungs. No focal airspace disease or consolidation. No pleural effusion or thickening. No pneumothorax. No destructive bone lesions. Degenerative changes in the thoracic spine.  Review of the MIP images confirms the above findings.  IMPRESSION: No evidence of aortic dissection or aneurysm. No pulmonary embolus. 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   Electronically Signed   By: WLucienne CapersM.D.   On: 11/26/2013 02:52    Assessment/Plan  73yo female with back pain and bilateral arm pain  Principal Problem:   Back pain-  Pain sounds musculoskeletal.  ekg with no acute changes and trop neg.  Complete serial trops ck echo in am.  Aspirin.  Pain is resolved at this time, if recurs consider xray spine.  Active Problems:   Dyspnea   Hypertension   Arm pain  obs  on tele.  Full code.  DAVID,RACHAL A 11/26/2013, 3:52 AM

## 2013-11-26 NOTE — Care Management Note (Signed)
    Page 1 of 1   11/26/2013     3:15:23 PM CARE MANAGEMENT NOTE 11/26/2013  Patient:  [facility]   Account Number:  [id number]  Date Initiated:  11/26/2013  Documentation initiated by:  Jolene Provost  Subjective/Objective Assessment:   Pt is from home, lives with son and is independent with ADL's. Pt has no HH services, DME's, or medication needs prior to admission.     Action/Plan:   Pt plans to discharge home with self care today. Pt has no CM needs at this time.   Anticipated DC Date:  11/26/2013   Anticipated DC Plan:  [place]  CM consult      Choice offered to / List presented to:             Status of service:  Completed, signed off Medicare Important Message given?   (If response is "NO", the following Medicare IM given date fields will be blank) Date Medicare IM given:   Medicare IM given by:   Date Additional Medicare IM given:   Additional Medicare IM given by:    Discharge Disposition:  HOME/SELF CARE  Per UR Regulation:    If discussed at Long Length of Stay Meetings, dates discussed:    Comments:  11/26/2013 [place], RN, MSN, [facility]

## 2013-11-26 NOTE — Progress Notes (Signed)
Discharge instructions reviewed with patient. Understanding verbalized. IV out, Telemetry off. Patient ready for discharge.

## 2013-11-26 NOTE — Discharge Summary (Addendum)
Patient seen, independently examined and chart reviewed. I agree with exam, assessment and plan discussed with Patricia Carrel, NP.  73 year old woman with history of hypertension, COPD, smoker presented with sudden back pain with radiation to both arms. She reports no history of cardiac disease or recent chest pain. She does become somewhat short of breath with exertion but she relates this to her chronic smoking and COPD. There has been no change in this. She has had occasional pain in one or both arms and sometimes in her back which seems to be short lived. This has occurred at least several times over the last year. The episode she had last night was similar only it persisted longer and was more intense. She describes it as a pressure-like sensation in both her arms some pain in her back. It lasted perhaps one hour and has not recurred. Currently she feels quite well and has no shortness of breath. She never had any chest pain. No neck pain.  EDP questioned angina, admitting MD suspected musculoskeletal.  PMH COPD Smoker HTN Breast cancer No history of cardiac disease. Had a stress test many years ago which was unremarkable.   Subjective: She feels quite well today. No chest pain or shortness of breath. No arm or back pain.  Objective: Afebrile, vital signs stable. No hypoxia.  Gen. She appears calm and comfortable.  Psych. Alert. Speech fluent and clear.  Cardiovascular. Regular rate and rhythm. No murmur, rub or gallop. No lower extremity edema. Telemetry sinus rhythm.  Respiratory. Clear to auscultation bilaterally. No wheezes, rales or rhonchi. Normal respiratory effort.  Abdomen. Soft  Musculoskeletal. No apparent pain with movement of arms.  Neurologic. Grossly non-focal     Troponins negative, complete metabolic panel unremarkable, BNP normal.  CBC unremarkable  CT of the chest without evidence of acute abnormalities, no PE. Emphysematous changes were seen in the  lungs.  2-D echocardiogram LVEF 65-70%. Grade 1 diastolic dysfunction. Mild mitral regurgitation. Normal wall motion. No regional wall motion abnormalities.  EKG sinus rhythm, cannot rule out anteroseptal infarct age unknown. No acute changes seen.  Today she feels quite well. She has had no chest pain or shortness of breath nor has her arm pain recurred. Her symptoms are somewhat atypical. Favor musculoskeletal etiology.  She does have some cardiac risk factors but she has ruled out for ACS, her EKG is nonacute and her 2-D echocardiogram was reassuring. Additionally CT angiogram of the chest was unremarkable. At this point plan on discharge home, start aspirin, close outpatient followup with cardiology for any further recommendations.  Has follow-up with Dr. Harl Bowie 8/26.  Patricia Hodgkins, MD [facility] [phone]

## 2013-11-26 NOTE — ED Notes (Signed)
Pt states she was awoke from sleep with sharp pains in both arms that goes across the top of her back as well.  Pt then states she started feeling sob.  Pt denies cp

## 2013-11-26 NOTE — Progress Notes (Signed)
Holding discharge until 1600 cardiac enzymes are drawn per Dr. Sarajane Jews. Ok per patient

## 2013-11-28 ENCOUNTER — Encounter: Payer: Medicare Other | Admitting: Cardiology

## 2013-11-30 ENCOUNTER — Ambulatory Visit (HOSPITAL_COMMUNITY)
Admission: RE | Admit: 2013-11-30 | Discharge: 2013-11-30 | Disposition: A | Discharge status: Home / Self Care | Payer: Medicare Other | Source: Ambulatory Visit | Attending: Internal Medicine | Admitting: Internal Medicine

## 2013-11-30 DIAGNOSIS — J441 Chronic obstructive pulmonary disease with (acute) exacerbation: Secondary | ICD-10-CM | POA: Insufficient documentation

## 2013-11-30 MED ORDER — ALBUTEROL SULFATE (2.5 MG/3ML) 0.083% IN NEBU
2.5000 mg | INHALATION_SOLUTION | Freq: Once | RESPIRATORY_TRACT | Status: AC
  Administered 2013-11-30: 2.5 mg via RESPIRATORY_TRACT

## 2013-12-01 ENCOUNTER — Telehealth: Payer: Self-pay | Admitting: Family Medicine

## 2013-12-01 NOTE — Telephone Encounter (Signed)
error 

## 2013-12-05 LAB — PULMONARY FUNCTION TEST
DL/VA % pred: 58 %
DL/VA: 3.01 ml/min/mmHg/L
DLCO cor % pred: 40 %
DLCO cor: 11.5 ml/min/mmHg
DLCO unc % pred: 40 %
DLCO unc: 11.5 ml/min/mmHg
FEF 25-75 Post: 1.11 L/sec
FEF 25-75 Pre: 0.83 L/sec
FEF2575-%Change-Post: 33 %
FEF2575-%Pred-Post: 54 %
FEF2575-%Pred-Pre: 40 %
FEV1-%Change-Post: 6 %
FEV1-%Pred-Post: 70 %
FEV1-%Pred-Pre: 66 %
FEV1-Post: 1.78 L
FEV1-Pre: 1.68 L
FEV1FVC-%Change-Post: 3 %
FEV1FVC-%Pred-Pre: 86 %
FEV6-%Change-Post: 3 %
FEV6-%Pred-Post: 80 %
FEV6-%Pred-Pre: 77 %
FEV6-Post: 2.57 L
FEV6-Pre: 2.49 L
FEV6FVC-%Change-Post: 0 %
FEV6FVC-%Pred-Post: 102 %
FEV6FVC-%Pred-Pre: 102 %
FVC-%Change-Post: 2 %
FVC-%Pred-Post: 77 %
FVC-%Pred-Pre: 75 %
FVC-Post: 2.61 L
FVC-Pre: 2.54 L
Post FEV1/FVC ratio: 68 %
Post FEV6/FVC ratio: 98 %
Pre FEV1/FVC ratio: 66 %
Pre FEV6/FVC Ratio: 98 %
RV % pred: 138 %
RV: 3.26 L
TLC % pred: 102 %
TLC: 5.65 L

## 2013-12-13 ENCOUNTER — Encounter: Payer: Self-pay | Admitting: Cardiology

## 2013-12-13 ENCOUNTER — Ambulatory Visit (INDEPENDENT_AMBULATORY_CARE_PROVIDER_SITE_OTHER): Payer: Medicare Other | Admitting: Cardiology

## 2013-12-13 VITALS — BP 122/68 | HR 66 | Ht 68.0 in | Wt 131.0 lb

## 2013-12-13 DIAGNOSIS — R0789 Other chest pain: Secondary | ICD-10-CM

## 2013-12-13 NOTE — Progress Notes (Signed)
Clinical Summary Patricia Jackson is a 73 y.o.female seen today as a new patient for the following medical problems.  1. Atypical chest pain - recent admission with chest pain - described as back pain radiating down both arms that woke her from sleep -started lower neck/upper back, radiatiated to both arms  Sharp pain, worst with position. Pain lasted approx 1 hour. No SOB, no diaphoresis, no N/V. No recurrence of pain since discharge - no evidence of ACS by EKG or enzymes - echo 11/2013 LVEF 78-24%, grade I diastolic dysfunction - CT chest with aneurysm or dissection, no PE. +atherosclerotic changes in aorta.     Past Medical History  Diagnosis Date  . Hypertension   . Breast cancer      Allergies  Allergen Reactions  . Sulfa Antibiotics Hives     Current Outpatient Prescriptions  Medication Sig Dispense Refill  . amLODipine (NORVASC) 10 MG tablet Take 10 mg by mouth daily.      Marland Kitchen aspirin EC 325 MG EC tablet Take 1 tablet (325 mg total) by mouth daily.  30 tablet  0  . butalbital-acetaminophen-caffeine (FIORICET, ESGIC) 50-325-40 MG per tablet Take 1 tablet by mouth 2 (two) times daily as needed for headache.      . dextromethorphan-guaiFENesin (MUCINEX DM) 30-600 MG per 12 hr tablet Take 1 tablet by mouth 2 (two) times daily.      . diphenhydrAMINE (SOMINEX) 25 MG tablet Take 25 mg by mouth at bedtime as needed for sleep.      Marland Kitchen losartan (COZAAR) 100 MG tablet Take 100 mg by mouth daily.       No current facility-administered medications for this visit.     Past Surgical History  Procedure Laterality Date  . Appendectomy    . Breast surgery       Allergies  Allergen Reactions  . Sulfa Antibiotics Hives      No family history on file.   Social History Ms. Caloca reports that she has been smoking Cigarettes.  She has a 50 pack-year smoking history. She does not have any smokeless tobacco history on file. Ms. Montijo reports that she does not drink  alcohol.   Review of Systems CONSTITUTIONAL: No weight loss, fever, chills, weakness or fatigue.  HEENT: Eyes: No visual loss, blurred vision, double vision or yellow sclerae.No hearing loss, sneezing, congestion, runny nose or sore throat.  SKIN: No rash or itching.  CARDIOVASCULAR: per HPI RESPIRATORY: No shortness of breath, cough or sputum.  GASTROINTESTINAL: No anorexia, nausea, vomiting or diarrhea. No abdominal pain or blood.  GENITOURINARY: No burning on urination, no polyuria NEUROLOGICAL: No headache, dizziness, syncope, paralysis, ataxia, numbness or tingling in the extremities. No change in bowel or bladder control.  MUSCULOSKELETAL: No muscle, back pain, joint pain or stiffness.  LYMPHATICS: No enlarged nodes. No history of splenectomy.  PSYCHIATRIC: No history of depression or anxiety.  ENDOCRINOLOGIC: No reports of sweating, cold or heat intolerance. No polyuria or polydipsia.  Marland Kitchen   Physical Examination p 66 bp 122/68 Wt 131 lbs BMI 20 Gen: resting comfortably, no acute distress HEENT: no scleral icterus, pupils equal round and reactive, no palptable cervical adenopathy,  CV: RRR, no m/r/g, no JVD, no carotid bruits Resp: Clear to auscultation bilaterally GI: abdomen is soft, non-tender, non-distended, normal bowel sounds, no hepatosplenomegaly MSK: extremities are warm, no edema.  Skin: warm, no rash Neuro:  no focal deficits Psych: appropriate affect   Diagnostic Studies 11/2013 Echo Study Conclusions  -  Left ventricle: The cavity size was normal. Wall thickness was at the upper limits of normal. Systolic function was vigorous. The estimated ejection fraction was in the range of 65% to 70%. Wall motion was normal; there were no regional wall motion abnormalities. Doppler parameters are consistent with abnormal left ventricular relaxation (grade 1 diastolic dysfunction). Doppler parameters are consistent with elevated ventricular end-diastolic filling  pressure. - Mitral valve: Calcified annulus. There was mild regurgitation. - Right atrium: Central venous pressure (est): 3 mm Hg. - Atrial septum: No defect or patent foramen ovale was identified. - Tricuspid valve: There was trivial regurgitation. - Pulmonary arteries: PA peak pressure: 28 mm Hg (S). - Pericardium, extracardiac: There was no pericardial effusion.  Impressions:  - Upper normal LV wall thickness with LVEF 15-61%, grade 1 diastolic dysfunction with increased filling pressures. Mild mitral regurgitation. Trivial tricuspid regurgitation with normal PASP 28 mmHg.      Assessment and Plan  1. Atypical chest pain - atypical symptoms, possible cervical radiculopathy. Negative cardiac workup in this hospital, no clear indication for outpatient stress testing at this time - continue CAD risk factor modification.       Arnoldo Lenis, M.D., F.A.C.C.

## 2013-12-13 NOTE — Patient Instructions (Signed)
Your physician wants you to follow-up in: 1 year You will receive a reminder letter in the mail two months in advance. If you don't receive a letter, please call our office to schedule the follow-up appointment.    Your physician recommends that you continue on your current medications as directed. Please refer to the Current Medication list given to you today.     Thank you for choosing [facility] !  

## 2013-12-14 ENCOUNTER — Encounter: Payer: Medicare Other | Admitting: Cardiology

## 2014-03-12 ENCOUNTER — Other Ambulatory Visit (HOSPITAL_COMMUNITY): Payer: Self-pay | Admitting: Internal Medicine

## 2014-03-12 DIAGNOSIS — Z1231 Encounter for screening mammogram for malignant neoplasm of breast: Secondary | ICD-10-CM

## 2014-03-15 ENCOUNTER — Other Ambulatory Visit (HOSPITAL_COMMUNITY): Payer: Self-pay | Admitting: Internal Medicine

## 2014-03-15 DIAGNOSIS — R0989 Other specified symptoms and signs involving the circulatory and respiratory systems: Secondary | ICD-10-CM

## 2014-03-20 ENCOUNTER — Ambulatory Visit (HOSPITAL_COMMUNITY)
Admission: RE | Admit: 2014-03-20 | Discharge: 2014-03-20 | Disposition: A | Discharge status: Home / Self Care | Payer: Medicare Other | Source: Ambulatory Visit | Attending: Internal Medicine | Admitting: Internal Medicine

## 2014-03-20 ENCOUNTER — Other Ambulatory Visit (HOSPITAL_COMMUNITY): Payer: Self-pay | Admitting: Internal Medicine

## 2014-03-20 DIAGNOSIS — I10 Essential (primary) hypertension: Secondary | ICD-10-CM | POA: Insufficient documentation

## 2014-03-20 DIAGNOSIS — R0989 Other specified symptoms and signs involving the circulatory and respiratory systems: Secondary | ICD-10-CM | POA: Insufficient documentation

## 2014-03-20 DIAGNOSIS — Z1231 Encounter for screening mammogram for malignant neoplasm of breast: Secondary | ICD-10-CM

## 2014-03-20 DIAGNOSIS — F17210 Nicotine dependence, cigarettes, uncomplicated: Secondary | ICD-10-CM | POA: Diagnosis not present

## 2014-03-20 DIAGNOSIS — I6522 Occlusion and stenosis of left carotid artery: Secondary | ICD-10-CM | POA: Insufficient documentation

## 2014-05-06 ENCOUNTER — Encounter: Payer: Self-pay | Admitting: Vascular Surgery

## 2014-05-07 ENCOUNTER — Encounter: Payer: Self-pay | Admitting: Vascular Surgery

## 2014-05-07 ENCOUNTER — Ambulatory Visit (INDEPENDENT_AMBULATORY_CARE_PROVIDER_SITE_OTHER): Payer: Medicare Other | Admitting: Vascular Surgery

## 2014-05-07 VITALS — BP 108/68 | HR 70 | Resp 16 | Ht 68.0 in | Wt 138.0 lb

## 2014-05-07 DIAGNOSIS — I6522 Occlusion and stenosis of left carotid artery: Secondary | ICD-10-CM | POA: Diagnosis not present

## 2014-05-07 DIAGNOSIS — I739 Peripheral vascular disease, unspecified: Secondary | ICD-10-CM | POA: Diagnosis not present

## 2014-05-07 NOTE — Progress Notes (Signed)
Patient name: Patricia Jackson MRN: [id number] DOB: 24-Jul-1940 Sex: female   Referred by: [place] Crane  Reason for referral:  Chief Complaint  Patient presents with  . New Evaluation    carorid stenosis referred by Dr Wende Neighbors    HISTORY OF PRESENT ILLNESS: Patient presents today for discussion of recent carotid duplex showing moderate stenosis in her left internal carotid artery. She is a 74 year old with a long history of cigarette smoking. He denies any prior history of amaurosis fugax, transient ischemic attack or stroke. No history of any aphasia. She does report occasional dizziness. She was found to have a carotid bruit and underwent duplex on 03/20/2014 at an outlying hospital. I do have these studies for review. 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 There was some plaque but no stenosis in the right internal carotid artery. Vertebral arteries are antegrade bilaterally.  The patient also reports fairly typical claudication type symptoms. She has no resting pain. She reports that if she walks for any. She has aching and tiredness in both legs from her knees distally. This responds to rest within a few minutes and resolves. She has no tissue loss. 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 Hypertension   . Breast cancer     Past Surgical History  Procedure Laterality Date  . Appendectomy    . Breast surgery      History   Social History  . Marital Status: Divorced    Spouse Name: N/A    Number of Children: N/A  . Years of Education: N/A   Occupational History  . Not on file.   Social History Main Topics  . Smoking status: Current Every Day Smoker -- 1.00 packs/day for 50 years    Types: Cigarettes  . Smokeless tobacco: Never Used  . Alcohol Use: No  . Drug Use: No  . Sexual Activity: Not  Currently   Other Topics Concern  . Not on file   Social History Narrative    Family History  Problem Relation Age of Onset  . Heart disease Mother   . COPD Father     Allergies as of 05/07/2014 - Review Complete 05/07/2014  Allergen Reaction Noted  . Sulfa antibiotics Hives 08/02/2013    Current Outpatient Prescriptions on File Prior to Visit  Medication Sig Dispense Refill  . amLODipine (NORVASC) 10 MG tablet Take 10 mg by mouth daily.    Marland Kitchen aspirin EC 325 MG EC tablet Take 1 tablet (325 mg total) by mouth daily. 30 tablet 0  . butalbital-acetaminophen-caffeine (FIORICET, ESGIC) 50-325-40 MG per tablet Take 1 tablet by mouth 2 (two) times daily as needed for headache.    . losartan (COZAAR) 100 MG tablet Take 100 mg by mouth daily.     No current facility-administered medications on file prior to visit.     REVIEW OF SYSTEMS:  Positives indicated with an "X"  CARDIOVASCULAR:  [ ]  chest pain   [ ]  chest pressure   [ ]  palpitations   [ ]  orthopnea   [ ]  dyspnea on exertion   [x ] claudication   [ ]  rest pain   [ ]  DVT   [ ]  phlebitis PULMONARY:   [ ]  productive cough   [ ]  asthma   [ ]  wheezing NEUROLOGIC:   [ ]   weakness  [ ]  paresthesias  [ ]  aphasia  [ ]  amaurosis  [x ] dizziness HEMATOLOGIC:   [ ]  bleeding problems   [ ]  clotting disorders MUSCULOSKELETAL:  [ ]  joint pain   [ ]  joint swelling GASTROINTESTINAL: [ ]   blood in stool  [ ]   hematemesis GENITOURINARY:  [ ]   dysuria  [ ]   hematuria PSYCHIATRIC:  [ ]  history of major depression INTEGUMENTARY:  [ ]  rashes  [ ]  ulcers CONSTITUTIONAL:  [ ]  fever   [ ]  chills  PHYSICAL EXAMINATION:  General: The patient is a well-nourished female, in no acute distress. Vital signs are BP 108/68 mmHg  Pulse 70  Resp 16  Ht 5' 8"  (1.727 m)  Wt 138 lb (62.596 kg)  BMI 20.99 kg/m2 Pulmonary: There is a good air exchange bilaterally without wheezing or rales. Abdomen: Soft and non-tender with normal pitch bowel  sounds. Musculoskeletal: There are no major deformities.  There is no significant extremity pain. Neurologic: No focal weakness or paresthesias are detected, Skin: She has hives on her forearms bilaterally Psychiatric: The patient has normal affect. Cardiovascular: There is a regular rate and rhythm without significant murmur appreciated. Pulse status: 2+ radial 2+ femoral pulses bilaterally. Absent popliteal and pedal pulses bilaterally. Her feet are cyanotic. Carotid arteries: I do not hear bruits today   Vascular Lab Studies:  Reviewed from 12 16,015. Moderate left carotid stenosis   Impression and Plan:  Long discussion with the patient. I did explain the critical importance of smoking cessation. I did review symptoms of carotid disease with her and she does report immediately to the emergency room should this occur. With her 50-69% stenosis I would recommend yearly carotid duplex and we will see her in one year for duplex and further discussion. I did discuss her claudication type symptoms. This is not limiting to her. I did not recommend any further evaluation or treatment at this time. I did explain symptoms of progressive ischemia with tissue loss and she knows to report [place] should this occur. We'll see her in one year with carotid duplex and ankle arm indices    Melynda Krzywicki [facility] Office: [phone]

## 2014-05-07 NOTE — Addendum Note (Signed)
Addended by: Mena Goes on: 05/07/2014 10:35 AM   Modules accepted: Orders

## 2014-05-17 DIAGNOSIS — Z6822 Body mass index (BMI) 22.0-22.9, adult: Secondary | ICD-10-CM | POA: Diagnosis not present

## 2014-05-17 DIAGNOSIS — L239 Allergic contact dermatitis, unspecified cause: Secondary | ICD-10-CM | POA: Diagnosis not present

## 2014-05-17 DIAGNOSIS — I959 Hypotension, unspecified: Secondary | ICD-10-CM | POA: Diagnosis not present

## 2014-06-19 DIAGNOSIS — R21 Rash and other nonspecific skin eruption: Secondary | ICD-10-CM | POA: Diagnosis not present

## 2014-06-19 DIAGNOSIS — J069 Acute upper respiratory infection, unspecified: Secondary | ICD-10-CM | POA: Diagnosis not present

## 2014-07-07 ENCOUNTER — Encounter (HOSPITAL_COMMUNITY): Payer: Self-pay | Admitting: Cardiology

## 2014-07-07 ENCOUNTER — Emergency Department (HOSPITAL_COMMUNITY): Payer: Medicare Other

## 2014-07-07 ENCOUNTER — Emergency Department (HOSPITAL_COMMUNITY)
Admission: EM | Admit: 2014-07-07 | Discharge: 2014-07-07 | Disposition: A | Discharge status: Home / Self Care | Payer: Medicare Other | Attending: Emergency Medicine | Admitting: Emergency Medicine

## 2014-07-07 DIAGNOSIS — R05 Cough: Secondary | ICD-10-CM | POA: Diagnosis not present

## 2014-07-07 DIAGNOSIS — Z853 Personal history of malignant neoplasm of breast: Secondary | ICD-10-CM | POA: Insufficient documentation

## 2014-07-07 DIAGNOSIS — Z79899 Other long term (current) drug therapy: Secondary | ICD-10-CM | POA: Diagnosis not present

## 2014-07-07 DIAGNOSIS — J441 Chronic obstructive pulmonary disease with (acute) exacerbation: Secondary | ICD-10-CM

## 2014-07-07 DIAGNOSIS — Z720 Tobacco use: Secondary | ICD-10-CM | POA: Diagnosis not present

## 2014-07-07 DIAGNOSIS — I10 Essential (primary) hypertension: Secondary | ICD-10-CM | POA: Insufficient documentation

## 2014-07-07 DIAGNOSIS — Z7982 Long term (current) use of aspirin: Secondary | ICD-10-CM | POA: Diagnosis not present

## 2014-07-07 DIAGNOSIS — R0602 Shortness of breath: Secondary | ICD-10-CM | POA: Diagnosis present

## 2014-07-07 DIAGNOSIS — J449 Chronic obstructive pulmonary disease, unspecified: Secondary | ICD-10-CM | POA: Diagnosis not present

## 2014-07-07 HISTORY — DX: Chronic obstructive pulmonary disease, unspecified: J44.9

## 2014-07-07 MED ORDER — PREDNISONE 20 MG PO TABS: 20.0000 mg | ORAL_TABLET | Freq: Two times a day (BID) | ORAL | Status: DC

## 2014-07-07 MED ORDER — ALBUTEROL SULFATE HFA 108 (90 BASE) MCG/ACT IN AERS
2.0000 | INHALATION_SPRAY | Freq: Once | RESPIRATORY_TRACT | Status: AC
  Administered 2014-07-07: 2 via RESPIRATORY_TRACT
  Filled 2014-07-07: qty 6.7

## 2014-07-07 MED ORDER — IPRATROPIUM-ALBUTEROL 0.5-2.5 (3) MG/3ML IN SOLN
3.0000 mL | Freq: Once | RESPIRATORY_TRACT | Status: AC
  Administered 2014-07-07: 3 mL via RESPIRATORY_TRACT
  Filled 2014-07-07: qty 3

## 2014-07-07 MED ORDER — ALBUTEROL SULFATE (5 MG/ML) 0.5% IN NEBU: 2.5000 mg | INHALATION_SOLUTION | Freq: Four times a day (QID) | RESPIRATORY_TRACT | Status: DC | PRN

## 2014-07-07 MED ORDER — ALBUTEROL SULFATE (2.5 MG/3ML) 0.083% IN NEBU
2.5000 mg | INHALATION_SOLUTION | Freq: Once | RESPIRATORY_TRACT | Status: AC
  Administered 2014-07-07: 2.5 mg via RESPIRATORY_TRACT
  Filled 2014-07-07: qty 3

## 2014-07-07 MED ORDER — DOXYCYCLINE HYCLATE 100 MG PO CAPS: 100.0000 mg | ORAL_CAPSULE | Freq: Two times a day (BID) | ORAL | Status: DC

## 2014-07-07 MED ORDER — PREDNISONE 50 MG PO TABS
60.0000 mg | ORAL_TABLET | Freq: Once | ORAL | Status: AC
  Administered 2014-07-07: 60 mg via ORAL
  Filled 2014-07-07 (×2): qty 1

## 2014-07-07 MED ORDER — ALBUTEROL SULFATE (2.5 MG/3ML) 0.083% IN NEBU: 2.5000 mg | INHALATION_SOLUTION | Freq: Four times a day (QID) | RESPIRATORY_TRACT | Status: DC | PRN

## 2014-07-07 NOTE — Discharge Instructions (Signed)
It is imperative that you stop smoking. Your lungs will get worse, if you do not stop smoking   Chronic Obstructive Pulmonary Disease Exacerbation Chronic obstructive pulmonary disease (COPD) is a common lung condition in which airflow from the lungs is limited. COPD is a general term that can be used to describe many different lung problems that limit airflow, including chronic bronchitis and emphysema. COPD exacerbations are episodes when breathing symptoms become much worse and require extra treatment. Without treatment, COPD exacerbations can be life threatening, and frequent COPD exacerbations can cause further damage to your lungs. CAUSES   Respiratory infections.   Exposure to smoke.   Exposure to air pollution, chemical fumes, or dust. Sometimes there is no apparent cause or trigger. RISK FACTORS  Smoking cigarettes.  Older age.  Frequent prior COPD exacerbations. SIGNS AND SYMPTOMS   Increased coughing.   Increased thick spit (sputum) production.   Increased wheezing.   Increased shortness of breath.   Rapid breathing.   Chest tightness. DIAGNOSIS  Your medical history, a physical exam, and tests will help your health care provider make a diagnosis. Tests may include:  A chest X-ray.  Basic lab tests.  Sputum testing.  An arterial blood gas test. TREATMENT  Depending on the severity of your COPD exacerbation, you may need to be admitted to a hospital for treatment. Some of the treatments commonly used to treat COPD exacerbations are:   Antibiotic medicines.   Bronchodilators. These are drugs that expand the air passages. They may be given with an inhaler or nebulizer. Spacer devices may be needed to help improve drug delivery.  Corticosteroid medicines.  Supplemental oxygen therapy.  HOME CARE INSTRUCTIONS   Do not smoke. Quitting smoking is very important to prevent COPD from getting worse and exacerbations from happening as often.  Avoid  exposure to all substances that irritate the airway, especially to tobacco smoke.   If you were prescribed an antibiotic medicine, finish it all even if you start to feel better.  Take all medicines as directed by your health care provider.It is important to use correct technique with inhaled medicines.  Drink enough fluids to keep your urine clear or pale yellow (unless you have a medical condition that requires fluid restriction).  Use a cool mist vaporizer. This makes it easier to clear your chest when you cough.   If you have a home nebulizer and oxygen, continue to use them as directed.   Maintain all necessary vaccinations to prevent infections.   Exercise regularly.   Eat a healthy diet.   Keep all follow-up appointments as directed by your health care provider. SEEK IMMEDIATE MEDICAL CARE IF:  You have worsening shortness of breath.   You have trouble talking.   You have severe chest pain.  You have blood in your sputum.  You have a fever.  You have weakness, vomit repeatedly, or faint.   You feel confused.   You continue to get worse. MAKE SURE YOU:   Understand these instructions.  Will watch your condition.  Will get help right away if you are not doing well or get worse. Document Released: 01/17/2007 Document Revised: 08/06/2013 Document Reviewed: 11/24/2012 [facility] Patient Information 2015 [place], [place]. This information is not intended to replace advice given to you by your health care provider. Make sure you discuss any questions you have with your health care provider.  Smoking Cessation Quitting smoking is important to your health and has many advantages. However, it is not always easy  to quit since nicotine is a very addictive drug. Oftentimes, people try 3 times or more before being able to quit. This document explains the best ways for you to prepare to quit smoking. Quitting takes hard work and a lot of effort, but you can do  it. ADVANTAGES OF QUITTING SMOKING  You will live longer, feel better, and live better.  Your body will feel the impact of quitting smoking almost immediately.  Within 20 minutes, blood pressure decreases. Your pulse returns to its normal level.  After 8 hours, carbon monoxide levels in the blood return to normal. Your oxygen level increases.  After 24 hours, the chance of having a heart attack starts to decrease. Your breath, hair, and body stop smelling like smoke.  After 48 hours, damaged nerve endings begin to recover. Your sense of taste and smell improve.  After 72 hours, the body is virtually free of nicotine. Your bronchial tubes relax and breathing becomes easier.  After 2 to 12 weeks, lungs can hold more air. Exercise becomes easier and circulation improves.  The risk of having a heart attack, stroke, cancer, or lung disease is greatly reduced.  After 1 year, the risk of coronary heart disease is cut in half.  After 5 years, the risk of stroke falls to the same as a nonsmoker.  After 10 years, the risk of lung cancer is cut in half and the risk of other cancers decreases significantly.  After 15 years, the risk of coronary heart disease drops, usually to the level of a nonsmoker.  If you are pregnant, quitting smoking will improve your chances of having a healthy baby.  The people you live with, especially any children, will be healthier.  You will have extra money to spend on things other than cigarettes. QUESTIONS TO THINK ABOUT BEFORE ATTEMPTING TO QUIT You may want to talk about your answers with your health care provider.  Why do you want to quit?  If you tried to quit in the past, what helped and what did not?  What will be the most difficult situations for you after you quit? How will you plan to handle them?  Who can help you through the tough times? Your family? Friends? A health care provider?  What pleasures do you get from smoking? What ways can you  still get pleasure if you quit? Here are some questions to ask your health care provider:  How can you help me to be successful at quitting?  What medicine do you think would be best for me and how should I take it?  What should I do if I need more help?  What is smoking withdrawal like? How can I get information on withdrawal? GET READY  Set a quit date.  Change your environment by getting rid of all cigarettes, ashtrays, matches, and lighters in your home, car, or work. Do not let people smoke in your home.  Review your past attempts to quit. Think about what worked and what did not. GET SUPPORT AND ENCOURAGEMENT You have a better chance of being successful if you have help. You can get support in many ways.  Tell your family, friends, and coworkers that you are going to quit and need their support. Ask them not to smoke around you.  Get individual, group, or telephone counseling and support. Programs are available at [employer] and health centers. Call your local health department for information about programs in your area.  Spiritual beliefs and practices may help  some smokers quit.  Download a "quit meter" on your computer to keep track of quit statistics, such as how long you have gone without smoking, cigarettes not smoked, and money saved.  Get a self-help book about quitting smoking and staying off tobacco. Brea yourself from urges to smoke. Talk to someone, go for a walk, or occupy your time with a task.  Change your normal routine. Take a different route to work. Drink tea instead of coffee. Eat breakfast in a different place.  Reduce your stress. Take a hot bath, exercise, or read a book.  Plan something enjoyable to do every day. Reward yourself for not smoking.  Explore interactive web-based programs that specialize in helping you quit. GET MEDICINE AND USE IT CORRECTLY Medicines can help you stop smoking and decrease  the urge to smoke. Combining medicine with the above behavioral methods and support can greatly increase your chances of successfully quitting smoking.  Nicotine replacement therapy helps deliver nicotine to your body without the negative effects and risks of smoking. Nicotine replacement therapy includes nicotine gum, lozenges, inhalers, nasal sprays, and skin patches. Some may be available over-the-counter and others require a prescription.  Antidepressant medicine helps people abstain from smoking, but how this works is unknown. This medicine is available by prescription.  Nicotinic receptor partial agonist medicine simulates the effect of nicotine in your brain. This medicine is available by prescription. Ask your health care provider for advice about which medicines to use and how to use them based on your health history. Your health care provider will tell you what side effects to look out for if you choose to be on a medicine or therapy. Carefully read the information on the package. Do not use any other product containing nicotine while using a nicotine replacement product.  RELAPSE OR DIFFICULT SITUATIONS Most relapses occur within the first 3 months after quitting. Do not be discouraged if you start smoking again. Remember, most people try several times before finally quitting. You may have symptoms of withdrawal because your body is used to nicotine. You may crave cigarettes, be irritable, feel very hungry, cough often, get headaches, or have difficulty concentrating. The withdrawal symptoms are only temporary. They are strongest when you first quit, but they will go away within 10-14 days. To reduce the chances of relapse, try to:  Avoid drinking alcohol. Drinking lowers your chances of successfully quitting.  Reduce the amount of caffeine you consume. Once you quit smoking, the amount of caffeine in your body increases and can give you symptoms, such as a rapid heartbeat, sweating, and  anxiety.  Avoid smokers because they can make you want to smoke.  Do not let weight gain distract you. Many smokers will gain weight when they quit, usually less than 10 pounds. Eat a healthy diet and stay active. You can always lose the weight gained after you quit.  Find ways to improve your mood other than smoking. FOR MORE INFORMATION  www.smokefree.gov  Document Released: 03/16/2001 Document Revised: 08/06/2013 Document Reviewed: 07/01/2011 [facility] Patient Information 2015 [place], [place]. This information is not intended to replace advice given to you by your health care provider. Make sure you discuss any questions you have with your health care provider.  Smoking Cessation, Tips for Success If you are ready to quit smoking, congratulations! You have chosen to help yourself be healthier. Cigarettes bring nicotine, tar, carbon monoxide, and other irritants into your body. Your lungs, heart, and blood vessels  will be able to work better without these poisons. There are many different ways to quit smoking. Nicotine gum, nicotine patches, a nicotine inhaler, or nicotine nasal spray can help with physical craving. Hypnosis, support groups, and medicines help break the habit of smoking. WHAT THINGS CAN I DO TO MAKE QUITTING EASIER?  Here are some tips to help you quit for good:  Pick a date when you will quit smoking completely. Tell all of your friends and family about your plan to quit on that date.  Do not try to slowly cut down on the number of cigarettes you are smoking. Pick a quit date and quit smoking completely starting on that day.  Throw away all cigarettes.   Clean and remove all ashtrays from your home, work, and car.  On a card, write down your reasons for quitting. Carry the card with you and read it when you get the urge to smoke.  Cleanse your body of nicotine. Drink enough water and fluids to keep your urine clear or pale yellow. Do this after quitting to flush the  nicotine from your body.  Learn to predict your moods. Do not let a bad situation be your excuse to have a cigarette. Some situations in your life might tempt you into wanting a cigarette.  Never have "just one" cigarette. It leads to wanting another and another. Remind yourself of your decision to quit.  Change habits associated with smoking. If you smoked while driving or when feeling stressed, try other activities to replace smoking. Stand up when drinking your coffee. Brush your teeth after eating. Sit in a different chair when you read the paper. Avoid alcohol while trying to quit, and try to drink fewer caffeinated beverages. Alcohol and caffeine may urge you to smoke.  Avoid foods and drinks that can trigger a desire to smoke, such as sugary or spicy foods and alcohol.  Ask people who smoke not to smoke around you.  Have something planned to do right after eating or having a cup of coffee. For example, plan to take a walk or exercise.  Try a relaxation exercise to calm you down and decrease your stress. Remember, you may be tense and nervous for the first 2 weeks after you quit, but this will pass.  Find new activities to keep your hands busy. Play with a pen, coin, or rubber band. Doodle or draw things on paper.  Brush your teeth right after eating. This will help cut down on the craving for the taste of tobacco after meals. You can also try mouthwash.   Use oral substitutes in place of cigarettes. Try using lemon drops, carrots, cinnamon sticks, or chewing gum. Keep them handy so they are available when you have the urge to smoke.  When you have the urge to smoke, try deep breathing.  Designate your home as a nonsmoking area.  If you are a heavy smoker, ask your health care provider about a prescription for nicotine chewing gum. It can ease your withdrawal from nicotine.  Reward yourself. Set aside the cigarette money you save and buy yourself something nice.  Look for support  from others. Join a support group or smoking cessation program. Ask someone at home or at work to help you with your plan to quit smoking.  Always ask yourself, "Do I need this cigarette or is this just a reflex?" Tell yourself, "Today, I choose not to smoke," or "I do not want to smoke." You are reminding yourself of  your decision to quit.  Do not replace cigarette smoking with electronic cigarettes (commonly called e-cigarettes). The safety of e-cigarettes is unknown, and some may contain harmful chemicals.  If you relapse, do not give up! Plan ahead and think about what you will do the next time you get the urge to smoke. HOW WILL I FEEL WHEN I QUIT SMOKING? You may have symptoms of withdrawal because your body is used to nicotine (the addictive substance in cigarettes). You may crave cigarettes, be irritable, feel very hungry, cough often, get headaches, or have difficulty concentrating. The withdrawal symptoms are only temporary. They are strongest when you first quit but will go away within 10-14 days. When withdrawal symptoms occur, stay in control. Think about your reasons for quitting. Remind yourself that these are signs that your body is healing and getting used to being without cigarettes. Remember that withdrawal symptoms are easier to treat than the major diseases that smoking can cause.  Even after the withdrawal is over, expect periodic urges to smoke. However, these cravings are generally short lived and will go away whether you smoke or not. Do not smoke! WHAT RESOURCES ARE AVAILABLE TO HELP ME QUIT SMOKING? Your health care provider can direct you to community resources or hospitals for support, which may include:  Group support.  Education.  Hypnosis.  Therapy. Document Released: 12/19/2003 Document Revised: 08/06/2013 Document Reviewed: 09/07/2012 [facility] Patient Information 2015 [place], [place]. This information is not intended to replace advice given to you by your health  care provider. Make sure you discuss any questions you have with your health care provider.  You Can Quit Smoking If you are ready to quit smoking or are thinking about it, congratulations! You have chosen to help yourself be healthier and live longer! There are lots of different ways to quit smoking. Nicotine gum, nicotine patches, a nicotine inhaler, or nicotine nasal spray can help with physical craving. Hypnosis, support groups, and medicines help break the habit of smoking. TIPS TO GET OFF AND STAY OFF CIGARETTES  Learn to predict your moods. Do not let a bad situation be your excuse to have a cigarette. Some situations in your life might tempt you to have a cigarette.  Ask friends and co-workers not to smoke around you.  Make your home smoke-free.  Never have "just one" cigarette. It leads to wanting another and another. Remind yourself of your decision to quit.  On a card, make a list of your reasons for not smoking. Read it at least the same number of times a day as you have a cigarette. Tell yourself everyday, "I do not want to smoke. I choose not to smoke."  Ask someone at home or work to help you with your plan to quit smoking.  Have something planned after you eat or have a cup of coffee. Take a walk or get other exercise to perk you up. This will help to keep you from overeating.  Try a relaxation exercise to calm you down and decrease your stress. Remember, you may be tense and nervous the first two weeks after you quit. This will pass.  Find new activities to keep your hands busy. Play with a pen, coin, or rubber band. Doodle or draw things on paper.  Brush your teeth right after eating. This will help cut down the craving for the taste of tobacco after meals. You can try mouthwash too.  Try gum, breath mints, or diet candy to keep something in your mouth. IF YOU  SMOKE AND WANT TO QUIT:  Do not stock up on cigarettes. Never buy a carton. Wait until one pack is finished  before you buy another.  Never carry cigarettes with you at work or at home.  Keep cigarettes as far away from you as possible. Leave them with someone else.  Never carry matches or a lighter with you.  Ask yourself, "Do I need this cigarette or is this just a reflex?"  Bet with someone that you can quit. Put cigarette money in a piggy bank every morning. If you smoke, you give up the money. If you do not smoke, by the end of the week, you keep the money.  Keep trying. It takes 21 days to change a habit!  Talk to your doctor about using medicines to help you quit. These include nicotine replacement gum, lozenges, or skin patches. Document Released: 01/16/2009 Document Revised: 06/14/2011 Document Reviewed: 01/16/2009 [facility] Patient Information 2015 [place], [place]. This information is not intended to replace advice given to you by your health care provider. Make sure you discuss any questions you have with your health care provider.

## 2014-07-07 NOTE — ED Provider Notes (Addendum)
CSN: [id number]     Arrival date & time 07/07/14  1453 History   First MD Initiated Contact with Patient 07/07/14 1715     Chief Complaint  Patient presents with  . Cough  . Shortness of Breath     (Consider location/radiation/quality/duration/timing/severity/associated sxs/prior Treatment) HPI   Patricia Jackson is a 74 y.o. female who presents for evaluation of persistent cough, shortness of breath on improved with albuterol inhaler, for 2-3 weeks. Cough is productive of green sputum. She denies fever, weakness or dizziness. She continues to smoke cigarettes.   Past Medical History  Diagnosis Date  . Hypertension   . Breast cancer   . COPD (chronic obstructive pulmonary disease)    Past Surgical History  Procedure Laterality Date  . Appendectomy    . Breast surgery     Family History  Problem Relation Age of Onset  . Heart disease Mother   . COPD Father    History  Substance Use Topics  . Smoking status: Current Every Day Smoker -- 1.00 packs/day for 50 years    Types: Cigarettes  . Smokeless tobacco: Never Used  . Alcohol Use: No   OB History    No data available     Review of Systems  All other systems reviewed and are negative.     Allergies  Sulfa antibiotics  Home Medications   Prior to Admission medications   Medication Sig Start Date End Date Taking? Authorizing Provider  amLODipine (NORVASC) 10 MG tablet Take 10 mg by mouth daily. 06/26/13  Yes Historical Provider, MD  aspirin EC 325 MG EC tablet Take 1 tablet (325 mg total) by mouth daily. 11/26/13  Yes Lezlie Octave Black, NP  hydrOXYzine (VISTARIL) 25 MG capsule Take 25 mg by mouth 3 (three) times daily as needed. 06/19/14  Yes Historical Provider, MD  losartan (COZAAR) 100 MG tablet Take 100 mg by mouth daily. 07/24/13  Yes Historical Provider, MD  simvastatin (ZOCOR) 20 MG tablet Take 20 mg by mouth daily.   Yes Historical Provider, MD  albuterol (PROVENTIL) (2.5 MG/3ML) 0.083% nebulizer solution Take 3  mLs (2.5 mg total) by nebulization every 6 (six) hours as needed for wheezing or shortness of breath. 07/07/14   Daleen Bo, MD  albuterol (PROVENTIL) (5 MG/ML) 0.5% nebulizer solution Take 0.5 mLs (2.5 mg total) by nebulization every 6 (six) hours as needed for wheezing or shortness of breath. 07/07/14   Daleen Bo, MD  doxycycline (VIBRAMYCIN) 100 MG capsule Take 1 capsule (100 mg total) by mouth 2 (two) times daily. One po bid x 7 days 07/07/14   Daleen Bo, MD  predniSONE (DELTASONE) 20 MG tablet Take 1 tablet (20 mg total) by mouth 2 (two) times daily. 07/07/14   Daleen Bo, MD   BP 111/55 mmHg  Pulse 90  Temp(Src) 97.6 F (36.4 C) (Oral)  Resp 17  Ht 5\' 9"  (1.753 m)  Wt 142 lb (64.411 kg)  BMI 20.96 kg/m2  SpO2 94% Physical Exam  Constitutional: She is oriented to person, place, and time. She appears well-developed.  Elderly, frail  HENT:  Head: Normocephalic and atraumatic.  Right Ear: External ear normal.  Left Ear: External ear normal.  Eyes: Conjunctivae and EOM are normal. Pupils are equal, round, and reactive to light.  Neck: Normal range of motion and phonation normal. Neck supple.  Cardiovascular: Normal rate, regular rhythm and normal heart sounds.   Pulmonary/Chest: Effort normal. No respiratory distress. She exhibits no bony tenderness.  Decreasing air  movement bilaterally, scattered wheezes, no rhonchi or rales.  Abdominal: Soft. There is no tenderness.  Musculoskeletal: Normal range of motion.  Neurological: She is alert and oriented to person, place, and time. No cranial nerve deficit or sensory deficit. She exhibits normal muscle tone. Coordination normal.  Skin: Skin is warm, dry and intact.  Psychiatric: She has a normal mood and affect. Her behavior is normal. Judgment and thought content normal.  Nursing note and vitals reviewed.   ED Course  Procedures (including critical care time)  Medications  ipratropium-albuterol (DUONEB) 0.5-2.5 (3) MG/3ML  nebulizer solution 3 mL (3 mLs Nebulization Given 07/07/14 1734)  albuterol (PROVENTIL) (2.5 MG/3ML) 0.083% nebulizer solution 2.5 mg (2.5 mg Nebulization Given 07/07/14 1734)  albuterol (PROVENTIL HFA;VENTOLIN HFA) 108 (90 BASE) MCG/ACT inhaler 2 puff (2 puffs Inhalation Given 07/07/14 1924)  predniSONE (DELTASONE) tablet 60 mg (60 mg Oral Given 07/07/14 1923)    Patient Vitals for the past 24 hrs:  BP Temp Temp src Pulse Resp SpO2 Height Weight  07/07/14 1843 111/55 mmHg - - 90 17 94 % - -  07/07/14 1807 - - - 72 - 100 % - -  07/07/14 1730 100/74 mmHg - - 82 22 97 % - -  07/07/14 1459 123/98 mmHg 97.6 F (36.4 C) Oral 74 (!) 28 93 % 5\' 9"  (1.753 m) 142 lb (64.411 kg)    7:47 PM Reevaluation with update and discussion. After initial assessment and treatment, an updated evaluation reveals she feels better and close to her baseline at this time. Findings discussed with patient's son, all questions answered.. [place] Review Labs Reviewed - No data to display  Imaging Review Dg Chest 2 View  07/07/2014   CLINICAL DATA:  Shortness of breath. Productive cough. Tobacco use. COPD.  EXAM: CHEST  2 VIEW  COMPARISON:  11/26/2013  FINDINGS: Emphysema. Atherosclerotic aortic arch. Mild scarring at the right lung apex. Flattening of the hemidiaphragms with large lung volumes.  Thoracic spondylosis.  Left mastectomy noted.  IMPRESSION: 1. Severe emphysema. 2. Scarring at the right lung apex. 3. Atherosclerosis. 4. Left mastectomy.   Electronically Signed   By: Van Clines M.D.   On: 07/07/2014 15:46     EKG Interpretation None      MDM   Final diagnoses:  COPD exacerbation  Tobacco abuse    COPD exacerbation with ongoing tobacco abuse. No evidence for pneumonia, serious bacterial infection or metabolic instability.  Nursing Notes Reviewed/ Care Coordinated Applicable Imaging Reviewed Interpretation of Laboratory Data incorporated into ED treatment  The patient appears  reasonably screened and/or stabilized for discharge and I doubt any other medical condition or other [facility] requiring further screening, evaluation, or treatment in the ED at this time prior to discharge.  Plan: Home Medications- Prednisone, Doxy., Alb Neb soln; Home Treatments- stop smoking; return here if the recommended treatment, does not improve the symptoms; Recommended follow up- PCP 4 days   Daleen Bo, MD 07/07/14 1949  Daleen Bo, MD 07/18/14 [id number]

## 2014-07-07 NOTE — ED Notes (Signed)
Pt alert & oriented x4, stable gait. Patient given discharge instructions, paperwork & prescription(s). Patient  instructed to stop at the registration desk to finish any additional paperwork. Patient verbalized understanding. Pt left department w/ no further questions. 

## 2014-07-07 NOTE — ED Notes (Signed)
Cough and sob for several weeks.

## 2014-07-07 NOTE — ED Notes (Signed)
COPD Patient w/2-3 weeks of cough, congestion.  Denies fever, chills, n/v/d. No sick contacts. Has not been on antibx therapy in past 30 days.  Has been using albuterol inh 3-4 x daily.  Continues to smoke.

## 2014-07-23 DIAGNOSIS — J449 Chronic obstructive pulmonary disease, unspecified: Secondary | ICD-10-CM | POA: Diagnosis not present

## 2014-07-23 DIAGNOSIS — Z720 Tobacco use: Secondary | ICD-10-CM | POA: Diagnosis not present

## 2014-07-23 DIAGNOSIS — M130 Polyarthritis, unspecified: Secondary | ICD-10-CM | POA: Diagnosis not present

## 2014-08-20 DIAGNOSIS — J449 Chronic obstructive pulmonary disease, unspecified: Secondary | ICD-10-CM | POA: Diagnosis not present

## 2014-09-17 DIAGNOSIS — L509 Urticaria, unspecified: Secondary | ICD-10-CM | POA: Diagnosis not present

## 2014-09-23 DIAGNOSIS — R7301 Impaired fasting glucose: Secondary | ICD-10-CM | POA: Diagnosis not present

## 2014-09-23 DIAGNOSIS — I10 Essential (primary) hypertension: Secondary | ICD-10-CM | POA: Diagnosis not present

## 2014-09-25 DIAGNOSIS — J449 Chronic obstructive pulmonary disease, unspecified: Secondary | ICD-10-CM | POA: Diagnosis not present

## 2014-09-25 DIAGNOSIS — R7301 Impaired fasting glucose: Secondary | ICD-10-CM | POA: Diagnosis not present

## 2014-09-25 DIAGNOSIS — I10 Essential (primary) hypertension: Secondary | ICD-10-CM | POA: Diagnosis not present

## 2014-09-25 DIAGNOSIS — R944 Abnormal results of kidney function studies: Secondary | ICD-10-CM | POA: Diagnosis not present

## 2014-10-24 DIAGNOSIS — I10 Essential (primary) hypertension: Secondary | ICD-10-CM | POA: Diagnosis not present

## 2014-10-24 DIAGNOSIS — R944 Abnormal results of kidney function studies: Secondary | ICD-10-CM | POA: Diagnosis not present

## 2014-10-24 DIAGNOSIS — J449 Chronic obstructive pulmonary disease, unspecified: Secondary | ICD-10-CM | POA: Diagnosis not present

## 2014-10-24 DIAGNOSIS — E876 Hypokalemia: Secondary | ICD-10-CM | POA: Diagnosis not present

## 2014-10-24 DIAGNOSIS — R42 Dizziness and giddiness: Secondary | ICD-10-CM | POA: Diagnosis not present

## 2015-04-09 DIAGNOSIS — J019 Acute sinusitis, unspecified: Secondary | ICD-10-CM | POA: Diagnosis not present

## 2015-05-07 ENCOUNTER — Encounter: Payer: Self-pay | Admitting: Family

## 2015-05-13 ENCOUNTER — Encounter (HOSPITAL_COMMUNITY): Payer: Medicare Other

## 2015-05-13 ENCOUNTER — Ambulatory Visit: Payer: Medicare Other | Admitting: Family

## 2015-06-03 DIAGNOSIS — J441 Chronic obstructive pulmonary disease with (acute) exacerbation: Secondary | ICD-10-CM | POA: Diagnosis not present

## 2015-06-03 DIAGNOSIS — R05 Cough: Secondary | ICD-10-CM | POA: Diagnosis not present

## 2015-07-16 DIAGNOSIS — F339 Major depressive disorder, recurrent, unspecified: Secondary | ICD-10-CM | POA: Diagnosis not present

## 2015-09-19 DIAGNOSIS — E782 Mixed hyperlipidemia: Secondary | ICD-10-CM | POA: Diagnosis not present

## 2015-09-19 DIAGNOSIS — R7301 Impaired fasting glucose: Secondary | ICD-10-CM | POA: Diagnosis not present

## 2015-10-10 DIAGNOSIS — F339 Major depressive disorder, recurrent, unspecified: Secondary | ICD-10-CM | POA: Diagnosis not present

## 2015-10-10 DIAGNOSIS — F411 Generalized anxiety disorder: Secondary | ICD-10-CM | POA: Diagnosis not present

## 2015-10-24 DIAGNOSIS — Z720 Tobacco use: Secondary | ICD-10-CM | POA: Diagnosis not present

## 2015-10-24 DIAGNOSIS — I10 Essential (primary) hypertension: Secondary | ICD-10-CM | POA: Diagnosis not present

## 2015-10-28 ENCOUNTER — Encounter (HOSPITAL_COMMUNITY): Payer: Self-pay | Admitting: Emergency Medicine

## 2015-10-28 ENCOUNTER — Emergency Department (HOSPITAL_COMMUNITY): Payer: Medicare Other

## 2015-10-28 ENCOUNTER — Emergency Department (HOSPITAL_COMMUNITY)
Admission: EM | Admit: 2015-10-28 | Discharge: 2015-10-28 | Disposition: A | Discharge status: Home / Self Care | Payer: Medicare Other | Source: Home / Self Care | Attending: Emergency Medicine | Admitting: Emergency Medicine

## 2015-10-28 DIAGNOSIS — F17210 Nicotine dependence, cigarettes, uncomplicated: Secondary | ICD-10-CM | POA: Insufficient documentation

## 2015-10-28 DIAGNOSIS — Z853 Personal history of malignant neoplasm of breast: Secondary | ICD-10-CM | POA: Diagnosis not present

## 2015-10-28 DIAGNOSIS — I10 Essential (primary) hypertension: Secondary | ICD-10-CM | POA: Insufficient documentation

## 2015-10-28 DIAGNOSIS — J441 Chronic obstructive pulmonary disease with (acute) exacerbation: Secondary | ICD-10-CM | POA: Insufficient documentation

## 2015-10-28 DIAGNOSIS — R110 Nausea: Secondary | ICD-10-CM | POA: Insufficient documentation

## 2015-10-28 DIAGNOSIS — I4892 Unspecified atrial flutter: Secondary | ICD-10-CM | POA: Diagnosis not present

## 2015-10-28 DIAGNOSIS — J440 Chronic obstructive pulmonary disease with acute lower respiratory infection: Secondary | ICD-10-CM | POA: Diagnosis not present

## 2015-10-28 DIAGNOSIS — E860 Dehydration: Secondary | ICD-10-CM

## 2015-10-28 DIAGNOSIS — I959 Hypotension, unspecified: Secondary | ICD-10-CM | POA: Diagnosis not present

## 2015-10-28 DIAGNOSIS — R05 Cough: Secondary | ICD-10-CM | POA: Diagnosis not present

## 2015-10-28 DIAGNOSIS — R0602 Shortness of breath: Secondary | ICD-10-CM | POA: Diagnosis not present

## 2015-10-28 DIAGNOSIS — M549 Dorsalgia, unspecified: Secondary | ICD-10-CM | POA: Insufficient documentation

## 2015-10-28 LAB — CBC WITH DIFFERENTIAL/PLATELET
Basophils Absolute: 0 10*3/uL (ref 0.0–0.1)
Basophils Relative: 0 %
Eosinophils Absolute: 0 10*3/uL (ref 0.0–0.7)
Eosinophils Relative: 0 %
HCT: 41 % (ref 36.0–46.0)
Hemoglobin: 13.8 g/dL (ref 12.0–15.0)
Lymphocytes Relative: 6 %
Lymphs Abs: 0.9 10*3/uL (ref 0.7–4.0)
MCH: 30 pg (ref 26.0–34.0)
MCHC: 33.7 g/dL (ref 30.0–36.0)
MCV: 89.1 fL (ref 78.0–100.0)
Monocytes Absolute: 1.4 10*3/uL — ABNORMAL HIGH (ref 0.1–1.0)
Monocytes Relative: 8 %
Neutro Abs: 14.2 10*3/uL — ABNORMAL HIGH (ref 1.7–7.7)
Neutrophils Relative %: 86 %
Platelets: 166 10*3/uL (ref 150–400)
RBC: 4.6 MIL/uL (ref 3.87–5.11)
RDW: 15.7 % — ABNORMAL HIGH (ref 11.5–15.5)
WBC: 16.5 10*3/uL — ABNORMAL HIGH (ref 4.0–10.5)

## 2015-10-28 LAB — COMPREHENSIVE METABOLIC PANEL
ALT: 14 U/L (ref 14–54)
AST: 32 U/L (ref 15–41)
Albumin: 4.1 g/dL (ref 3.5–5.0)
Alkaline Phosphatase: 65 U/L (ref 38–126)
Anion gap: 6 (ref 5–15)
BUN: 24 mg/dL — ABNORMAL HIGH (ref 6–20)
CO2: 19 mmol/L — ABNORMAL LOW (ref 22–32)
Calcium: 8.6 mg/dL — ABNORMAL LOW (ref 8.9–10.3)
Chloride: 105 mmol/L (ref 101–111)
Creatinine, Ser: 1.41 mg/dL — ABNORMAL HIGH (ref 0.44–1.00)
GFR calc Af Amer: 42 mL/min — ABNORMAL LOW (ref >60)
GFR calc non Af Amer: 36 mL/min — ABNORMAL LOW (ref >60)
Glucose, Bld: 93 mg/dL (ref 65–99)
Potassium: 4.3 mmol/L (ref 3.5–5.1)
Sodium: 130 mmol/L — ABNORMAL LOW (ref 135–145)
Total Bilirubin: 0.7 mg/dL (ref 0.3–1.2)
Total Protein: 7.8 g/dL (ref 6.5–8.1)

## 2015-10-28 MED ORDER — IPRATROPIUM-ALBUTEROL 0.5-2.5 (3) MG/3ML IN SOLN
3.0000 mL | Freq: Once | RESPIRATORY_TRACT | Status: AC
  Administered 2015-10-28: 3 mL via RESPIRATORY_TRACT
  Filled 2015-10-28: qty 3

## 2015-10-28 NOTE — ED Provider Notes (Signed)
[facility] DEPT Provider Note   CSN: [id number] Arrival date & time: 10/28/15  1707  First Provider Contact:  None    By signing my name below, I, Higinio Plan, attest that this documentation has been prepared under the direction and in the presence of Ripley Fraise, MD . Electronically Signed: Higinio Plan, Scribe. 10/28/2015. 9:22 PM.  History   Chief Complaint Chief Complaint  Patient presents with  . Shortness of Breath    HPI Comments: Patricia Jackson is a 75 y.o. female with PMHx of HTN, who presents to the Emergency Department complaining of gradually worsening, shortness of breath that began yesterday and worsened today. Pt reports hx of COPD; she states her symptoms feel similar to past flare ups of this. Per family, pt has not been taking her breathing treatments as prescribed; she notes she only takes them when needed. Pt states associated dry cough, nausea and headache that she believes could be due to her coughing. She denies fever, vomiting, chest pain, abdominal pain, hemoptysis, leg swelling and recent travel or injury. Pt also complains of bilateral hip and back pain that also began a few days ago. 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 She denies weakness in her BLE. She reports a hx of smoking ~1 pack a day for 50 years. Per family, pt is scheduled for a CT scan in a few weeks due to sudden weight loss.   The history is provided by the patient and a relative. No language interpreter was used.  Shortness of Breath  Associated symptoms include headaches. Pertinent negatives include no fever, no chest pain, no vomiting and no abdominal pain.    Past Medical History:  Diagnosis Date  . Breast cancer ([place])   . COPD (chronic obstructive pulmonary disease) ([place])   . Hypertension     Patient Active Problem List   Diagnosis Date Noted  . Dyspnea 11/26/2013  . Back pain 11/26/2013  . Arm pain 11/26/2013  . Chest pain 11/26/2013   . Hypertension     Past Surgical History:  Procedure Laterality Date  . APPENDECTOMY    . BREAST SURGERY      OB History    Gravida Para Term Preterm AB Living             1   SAB TAB Ectopic Multiple Live Births                 Home Medications    Prior to Admission medications   Medication Sig Start Date End Date Taking? Authorizing Provider  albuterol (PROVENTIL) (2.5 MG/3ML) 0.083% nebulizer solution Take 3 mLs (2.5 mg total) by nebulization every 6 (six) hours as needed for wheezing or shortness of breath. 07/07/14   Daleen Bo, MD  albuterol (PROVENTIL) (5 MG/ML) 0.5% nebulizer solution Take 0.5 mLs (2.5 mg total) by nebulization every 6 (six) hours as needed for wheezing or shortness of breath. 07/07/14   Daleen Bo, MD  amLODipine (NORVASC) 10 MG tablet Take 10 mg by mouth daily. 06/26/13   Historical Provider, MD  aspirin EC 325 MG EC tablet Take 1 tablet (325 mg total) by mouth daily. 11/26/13   Radene Gunning, NP  doxycycline (VIBRAMYCIN) 100 MG capsule Take 1 capsule (100 mg total) by mouth 2 (two) times daily. One po bid x 7 days 07/07/14   Daleen Bo, MD  hydrOXYzine (VISTARIL) 25 MG capsule Take 25 mg by mouth 3 (three) times  daily as needed. 06/19/14   Historical Provider, MD  losartan (COZAAR) 100 MG tablet Take 100 mg by mouth daily. 07/24/13   Historical Provider, MD  predniSONE (DELTASONE) 20 MG tablet Take 1 tablet (20 mg total) by mouth 2 (two) times daily. 07/07/14   Daleen Bo, MD  simvastatin (ZOCOR) 20 MG tablet Take 20 mg by mouth daily.    Historical Provider, MD    Family History Family History  Problem Relation Age of Onset  . Heart disease Mother   . COPD Father     Social History Social History  Substance Use Topics  . Smoking status: Current Every Day Smoker    Packs/day: 1.00    Years: 50.00    Types: Cigarettes  . Smokeless tobacco: Never Used  . Alcohol use No   Allergies   Sulfa antibiotics   Review of Systems Review of  Systems  Constitutional: Negative for fever.  Respiratory: Positive for shortness of breath.   Cardiovascular: Negative for chest pain.  Gastrointestinal: Positive for nausea. Negative for abdominal pain and vomiting.  Musculoskeletal: Positive for arthralgias and back pain.  Neurological: Positive for headaches.  All other systems reviewed and are negative.  Physical Exam Updated Vital Signs BP 110/59   Pulse 88   Temp 98.1 F (36.7 C) (Oral)   Resp 20   Ht 5\' 6"  (1.676 m)   Wt 123 lb (55.8 kg)   SpO2 96%   BMI 19.85 kg/m   Physical Exam CONSTITUTIONAL: Well developed/well nourished HEAD: Normocephalic/atraumatic EYES: EOMI/PERRL ENMT: Mucous membranes moist, uvula midline, no exudate  NECK: supple no meningeal signs SPINE/BACK:entire spine nontender CV: S1/S2 noted, no murmurs/rubs/gallops noted LUNGS:mild tachypnic, scattered wheezes at the bases  ABDOMEN: soft, nontender, no rebound or guarding, bowel sounds noted throughout abdomen GU:no cva tenderness NEURO: Pt is awake/alert/appropriate, moves all extremitiesx4.  No facial droop.   EXTREMITIES: pulses normal/equal, full ROM, no lower extremity edema  SKIN: warm, color normal PSYCH: no abnormalities of mood noted, alert and oriented to situation  ED Treatments / Results  Labs (all labs ordered are listed, but only abnormal results are displayed) Labs Reviewed  CBC WITH DIFFERENTIAL/PLATELET - Abnormal; Notable for the following:       Result Value   WBC 16.5 (*)    RDW 15.7 (*)    Neutro Abs 14.2 (*)    Monocytes Absolute 1.4 (*)    All other components within normal limits  COMPREHENSIVE METABOLIC PANEL - Abnormal; Notable for the following:    Sodium 130 (*)    CO2 19 (*)    BUN 24 (*)    Creatinine, Ser 1.41 (*)    Calcium 8.6 (*)    GFR calc non Af Amer 36 (*)    GFR calc Af Amer 42 (*)    All other components within normal limits    EKG  EKG Interpretation  Date/Time:  Tuesday October 28 2015  21:52:34 EDT Ventricular Rate:  85 PR Interval:    QRS Duration: 86 QT Interval:  382 QTC Calculation: 455 R Axis:   83 Text Interpretation:  Sinus rhythm Atrial premature complexes in couplets Borderline right axis deviation Nonspecific T abnormalities, lateral leads Confirmed by Christy Gentles  MD, Kabria Hetzer ([id number]) on 10/28/2015 9:57:31 PM       Radiology Dg Chest 2 View  Result Date: 10/28/2015 CLINICAL DATA:  Dry cough and shortness of Breath EXAM: CHEST  2 VIEW COMPARISON:  07/07/2014 FINDINGS: Cardiac shadow is stable. Aortic calcifications are  again seen and stable. The lungs remain hyperinflated consistent with COPD. Diffuse interstitial changes are again identified without focal infiltrate. No bony abnormality is seen. IMPRESSION: Chronic changes without acute abnormality. Aortic atherosclerosis Electronically Signed   By: Inez Catalina M.D.   On: 10/28/2015 18:08  Procedures Procedures  DIAGNOSTIC STUDIES:  Oxygen Saturation is 96% on RA, normal by my interpretation.    COORDINATION OF CARE:  9:06 PM Discussed treatment plan, which includes EKG and breathing treatment with pt at bedside and pt agreed to plan.   Medications Ordered in ED Medications  ipratropium-albuterol (DUONEB) 0.5-2.5 (3) MG/3ML nebulizer solution 3 mL (3 mLs Nebulization Given 10/28/15 2121)    Initial Impression / Assessment and Plan / ED Course  I have reviewed the triage vital signs and the nursing notes.  Pertinent labs & imaging results that were available during my care of the patient were reviewed by me and considered in my medical decision making (see chart for details).  Clinical Course    Pt with mild COPD exacerbation, would not prescribe steroids at this time She feels improved No CP reported Will d/c home Advised to increase PO fluids as patient is dehydrated Final Clinical Impressions(s) / ED Diagnoses   Final diagnoses:  COPD exacerbation ([place])  Dehydration    New  Prescriptions New Prescriptions   No medications on file   I personally performed the services described in this documentation, which was scribed in my presence. The recorded information has been reviewed and is accurate.     Ripley Fraise, MD 10/28/15 2227

## 2015-10-28 NOTE — ED Triage Notes (Signed)
PT c/o generalized body aches, cough and SOB on exertion x2 days. PT also c/o nonproductive cough.

## 2015-10-30 ENCOUNTER — Emergency Department (HOSPITAL_COMMUNITY): Payer: Medicare Other

## 2015-10-30 ENCOUNTER — Inpatient Hospital Stay (HOSPITAL_COMMUNITY)
Admission: EM | Admit: 2015-10-30 | Discharge: 2015-11-08 | DRG: 190 | Disposition: A | Discharge status: Home / Self Care | Payer: Medicare Other | Attending: Internal Medicine | Admitting: Internal Medicine

## 2015-10-30 ENCOUNTER — Encounter (HOSPITAL_COMMUNITY): Payer: Self-pay | Admitting: Emergency Medicine

## 2015-10-30 DIAGNOSIS — E785 Hyperlipidemia, unspecified: Secondary | ICD-10-CM | POA: Diagnosis present

## 2015-10-30 DIAGNOSIS — I4892 Unspecified atrial flutter: Secondary | ICD-10-CM | POA: Diagnosis present

## 2015-10-30 DIAGNOSIS — I959 Hypotension, unspecified: Secondary | ICD-10-CM | POA: Diagnosis present

## 2015-10-30 DIAGNOSIS — E782 Mixed hyperlipidemia: Secondary | ICD-10-CM | POA: Diagnosis present

## 2015-10-30 DIAGNOSIS — K5900 Constipation, unspecified: Secondary | ICD-10-CM | POA: Diagnosis present

## 2015-10-30 DIAGNOSIS — I10 Essential (primary) hypertension: Secondary | ICD-10-CM | POA: Diagnosis present

## 2015-10-30 DIAGNOSIS — Z881 Allergy status to other antibiotic agents status: Secondary | ICD-10-CM

## 2015-10-30 DIAGNOSIS — Z7982 Long term (current) use of aspirin: Secondary | ICD-10-CM | POA: Diagnosis not present

## 2015-10-30 DIAGNOSIS — J440 Chronic obstructive pulmonary disease with acute lower respiratory infection: Principal | ICD-10-CM | POA: Diagnosis present

## 2015-10-30 DIAGNOSIS — J209 Acute bronchitis, unspecified: Secondary | ICD-10-CM | POA: Diagnosis present

## 2015-10-30 DIAGNOSIS — J441 Chronic obstructive pulmonary disease with (acute) exacerbation: Secondary | ICD-10-CM | POA: Diagnosis present

## 2015-10-30 DIAGNOSIS — E871 Hypo-osmolality and hyponatremia: Secondary | ICD-10-CM | POA: Diagnosis present

## 2015-10-30 DIAGNOSIS — R7989 Other specified abnormal findings of blood chemistry: Secondary | ICD-10-CM | POA: Diagnosis not present

## 2015-10-30 DIAGNOSIS — Z8249 Family history of ischemic heart disease and other diseases of the circulatory system: Secondary | ICD-10-CM | POA: Diagnosis not present

## 2015-10-30 DIAGNOSIS — Z825 Family history of asthma and other chronic lower respiratory diseases: Secondary | ICD-10-CM

## 2015-10-30 DIAGNOSIS — J8410 Pulmonary fibrosis, unspecified: Secondary | ICD-10-CM | POA: Diagnosis present

## 2015-10-30 DIAGNOSIS — F17210 Nicotine dependence, cigarettes, uncomplicated: Secondary | ICD-10-CM | POA: Diagnosis present

## 2015-10-30 DIAGNOSIS — I4891 Unspecified atrial fibrillation: Secondary | ICD-10-CM | POA: Diagnosis present

## 2015-10-30 DIAGNOSIS — J189 Pneumonia, unspecified organism: Secondary | ICD-10-CM | POA: Diagnosis present

## 2015-10-30 DIAGNOSIS — J42 Unspecified chronic bronchitis: Secondary | ICD-10-CM | POA: Diagnosis not present

## 2015-10-30 DIAGNOSIS — D649 Anemia, unspecified: Secondary | ICD-10-CM | POA: Diagnosis present

## 2015-10-30 DIAGNOSIS — J9601 Acute respiratory failure with hypoxia: Secondary | ICD-10-CM

## 2015-10-30 DIAGNOSIS — R079 Chest pain, unspecified: Secondary | ICD-10-CM | POA: Diagnosis not present

## 2015-10-30 DIAGNOSIS — R0602 Shortness of breath: Secondary | ICD-10-CM | POA: Diagnosis not present

## 2015-10-30 DIAGNOSIS — Z853 Personal history of malignant neoplasm of breast: Secondary | ICD-10-CM | POA: Diagnosis not present

## 2015-10-30 DIAGNOSIS — Z7952 Long term (current) use of systemic steroids: Secondary | ICD-10-CM

## 2015-10-30 DIAGNOSIS — R778 Other specified abnormalities of plasma proteins: Secondary | ICD-10-CM | POA: Diagnosis present

## 2015-10-30 DIAGNOSIS — Z79899 Other long term (current) drug therapy: Secondary | ICD-10-CM | POA: Diagnosis not present

## 2015-10-30 DIAGNOSIS — R0609 Other forms of dyspnea: Secondary | ICD-10-CM

## 2015-10-30 LAB — MRSA PCR SCREENING: MRSA by PCR: NEGATIVE

## 2015-10-30 LAB — I-STAT TROPONIN, ED: Troponin i, poc: 0.01 ng/mL (ref 0.00–0.08)

## 2015-10-30 LAB — COMPREHENSIVE METABOLIC PANEL
ALT: 22 U/L (ref 14–54)
AST: 45 U/L — ABNORMAL HIGH (ref 15–41)
Albumin: 3.7 g/dL (ref 3.5–5.0)
Alkaline Phosphatase: 77 U/L (ref 38–126)
Anion gap: 10 (ref 5–15)
BUN: 26 mg/dL — ABNORMAL HIGH (ref 6–20)
CO2: 17 mmol/L — ABNORMAL LOW (ref 22–32)
Calcium: 8.5 mg/dL — ABNORMAL LOW (ref 8.9–10.3)
Chloride: 102 mmol/L (ref 101–111)
Creatinine, Ser: 1.42 mg/dL — ABNORMAL HIGH (ref 0.44–1.00)
GFR calc Af Amer: 42 mL/min — ABNORMAL LOW (ref >60)
GFR calc non Af Amer: 36 mL/min — ABNORMAL LOW (ref >60)
Glucose, Bld: 164 mg/dL — ABNORMAL HIGH (ref 65–99)
Potassium: 3.9 mmol/L (ref 3.5–5.1)
Sodium: 129 mmol/L — ABNORMAL LOW (ref 135–145)
Total Bilirubin: 0.7 mg/dL (ref 0.3–1.2)
Total Protein: 8 g/dL (ref 6.5–8.1)

## 2015-10-30 LAB — CBC WITH DIFFERENTIAL/PLATELET
Basophils Absolute: 0 10*3/uL (ref 0.0–0.1)
Basophils Relative: 0 %
Eosinophils Absolute: 0 10*3/uL (ref 0.0–0.7)
Eosinophils Relative: 0 %
HCT: 41.4 % (ref 36.0–46.0)
Hemoglobin: 13.9 g/dL (ref 12.0–15.0)
Lymphocytes Relative: 3 %
Lymphs Abs: 0.5 10*3/uL — ABNORMAL LOW (ref 0.7–4.0)
MCH: 29 pg (ref 26.0–34.0)
MCHC: 33.6 g/dL (ref 30.0–36.0)
MCV: 86.3 fL (ref 78.0–100.0)
Monocytes Absolute: 1.6 10*3/uL — ABNORMAL HIGH (ref 0.1–1.0)
Monocytes Relative: 9 %
Neutro Abs: 15.5 10*3/uL — ABNORMAL HIGH (ref 1.7–7.7)
Neutrophils Relative %: 88 %
Platelets: 181 10*3/uL (ref 150–400)
RBC: 4.8 MIL/uL (ref 3.87–5.11)
RDW: 15.6 % — ABNORMAL HIGH (ref 11.5–15.5)
WBC: 17.6 10*3/uL — ABNORMAL HIGH (ref 4.0–10.5)

## 2015-10-30 LAB — I-STAT CHEM 8, ED
BUN: 25 mg/dL — ABNORMAL HIGH (ref 6–20)
Calcium, Ion: 1.08 mmol/L — ABNORMAL LOW (ref 1.12–1.23)
Chloride: 102 mmol/L (ref 101–111)
Creatinine, Ser: 1.3 mg/dL — ABNORMAL HIGH (ref 0.44–1.00)
Glucose, Bld: 158 mg/dL — ABNORMAL HIGH (ref 65–99)
HCT: 45 % (ref 36.0–46.0)
Hemoglobin: 15.3 g/dL — ABNORMAL HIGH (ref 12.0–15.0)
Potassium: 3.9 mmol/L (ref 3.5–5.1)
Sodium: 134 mmol/L — ABNORMAL LOW (ref 135–145)
TCO2: 17 mmol/L (ref 0–100)

## 2015-10-30 MED ORDER — IPRATROPIUM BROMIDE 0.02 % IN SOLN
0.5000 mg | RESPIRATORY_TRACT | Status: DC | PRN
  Administered 2015-10-30: 0.5 mg via RESPIRATORY_TRACT
  Filled 2015-10-30: qty 2.5

## 2015-10-30 MED ORDER — DILTIAZEM HCL 100 MG IV SOLR
20.0000 mg/h | INTRAVENOUS | Status: DC
  Administered 2015-10-30: 15 mg/h via INTRAVENOUS
  Administered 2015-10-31 – 2015-11-02 (×6): 20 mg/h via INTRAVENOUS
  Filled 2015-10-30 (×11): qty 100

## 2015-10-30 MED ORDER — ADENOSINE 6 MG/2ML IV SOLN
INTRAVENOUS | Status: AC
  Administered 2015-10-30: 6 mg via INTRAVENOUS
  Filled 2015-10-30: qty 6

## 2015-10-30 MED ORDER — DEXTROSE 5 % IV SOLN
500.0000 mg | INTRAVENOUS | Status: AC
  Administered 2015-10-31 – 2015-11-03 (×5): 500 mg via INTRAVENOUS
  Filled 2015-10-30 (×5): qty 500

## 2015-10-30 MED ORDER — SODIUM CHLORIDE 0.9 % IV SOLN
1000.0000 mL | Freq: Once | INTRAVENOUS | Status: AC
  Administered 2015-10-30: 1000 mL via INTRAVENOUS

## 2015-10-30 MED ORDER — METOPROLOL TARTRATE 5 MG/5ML IV SOLN
INTRAVENOUS | Status: AC
  Administered 2015-10-30: 5 mg
  Filled 2015-10-30: qty 5

## 2015-10-30 MED ORDER — ACETAMINOPHEN 325 MG PO TABS: 650.0000 mg | ORAL_TABLET | ORAL | Status: DC | PRN

## 2015-10-30 MED ORDER — SIMVASTATIN 20 MG PO TABS
20.0000 mg | ORAL_TABLET | Freq: Every day | ORAL | Status: DC
  Administered 2015-10-31: 20 mg via ORAL
  Filled 2015-10-30: qty 1

## 2015-10-30 MED ORDER — ASPIRIN EC 81 MG PO TBEC
81.0000 mg | DELAYED_RELEASE_TABLET | Freq: Every day | ORAL | Status: DC
  Administered 2015-10-31 – 2015-11-02 (×3): 81 mg via ORAL
  Filled 2015-10-30 (×3): qty 1

## 2015-10-30 MED ORDER — DILTIAZEM HCL 25 MG/5ML IV SOLN
10.0000 mg | Freq: Once | INTRAVENOUS | Status: AC
  Administered 2015-10-30: 10 mg via INTRAVENOUS
  Filled 2015-10-30: qty 5

## 2015-10-30 MED ORDER — METOPROLOL TARTRATE 5 MG/5ML IV SOLN: 5.0000 mg | Freq: Once | INTRAVENOUS | Status: DC

## 2015-10-30 MED ORDER — SODIUM CHLORIDE 0.9 % IV BOLUS (SEPSIS)
1000.0000 mL | Freq: Once | INTRAVENOUS | Status: AC
  Administered 2015-10-30: 1000 mL via INTRAVENOUS

## 2015-10-30 MED ORDER — APIXABAN 5 MG PO TABS
5.0000 mg | ORAL_TABLET | Freq: Two times a day (BID) | ORAL | Status: DC
  Administered 2015-10-30 – 2015-10-31 (×2): 5 mg via ORAL
  Filled 2015-10-30 (×2): qty 1

## 2015-10-30 MED ORDER — DILTIAZEM HCL 100 MG IV SOLR
INTRAVENOUS | Status: AC
  Filled 2015-10-30: qty 100

## 2015-10-30 MED ORDER — DEXTROSE 5 % IV SOLN
1.0000 g | Freq: Every day | INTRAVENOUS | Status: AC
  Administered 2015-10-30 – 2015-11-06 (×8): 1 g via INTRAVENOUS
  Filled 2015-10-30 (×9): qty 10

## 2015-10-30 MED ORDER — DILTIAZEM HCL 100 MG IV SOLR
5.0000 mg/h | Freq: Once | INTRAVENOUS | Status: AC
  Administered 2015-10-30: 5 mg/h via INTRAVENOUS
  Filled 2015-10-30: qty 100

## 2015-10-30 MED ORDER — ADENOSINE 6 MG/2ML IV SOLN
6.0000 mg | Freq: Once | INTRAVENOUS | Status: AC
  Administered 2015-10-30: 6 mg via INTRAVENOUS

## 2015-10-30 MED ORDER — HYDROCODONE-ACETAMINOPHEN 5-325 MG PO TABS
1.0000 | ORAL_TABLET | Freq: Four times a day (QID) | ORAL | Status: DC | PRN
  Administered 2015-10-30 – 2015-11-08 (×17): 1 via ORAL
  Filled 2015-10-30 (×18): qty 1

## 2015-10-30 MED ORDER — ADENOSINE 6 MG/2ML IV SOLN
12.0000 mg | Freq: Once | INTRAVENOUS | Status: AC
  Administered 2015-10-30: 12 mg via INTRAVENOUS

## 2015-10-30 MED ORDER — GUAIFENESIN 100 MG/5ML PO SOLN
5.0000 mL | Freq: Four times a day (QID) | ORAL | Status: DC | PRN
Start: 2015-10-30 — End: 2015-11-08
  Administered 2015-10-30 – 2015-11-02 (×4): 100 mg via ORAL
  Filled 2015-10-30 (×6): qty 5

## 2015-10-30 NOTE — ED Notes (Signed)
Pt had run of SVT in the 160's lasting 3 minutes.  Pt denies any worsening sob at this time.  Denies any chest pain.

## 2015-10-30 NOTE — ED Triage Notes (Signed)
Pt with COPD reports sob since last weekend, worsening today, was seen at primary doctor on Tuesday and told to take breathing treatments at home.  Pt has labored respirations at this time, using accessory muscles to breathe.  Pt alert and oriented, 02 sats 100%.  Pt also has not been able to keep food down over past 4 days.

## 2015-10-30 NOTE — ED Notes (Signed)
Pt transferred via [place].

## 2015-10-30 NOTE — ED Notes (Signed)
md at bedside

## 2015-10-30 NOTE — ED Notes (Signed)
Heart rate in the 100's.  Pt states she feels better.  Has some sob.

## 2015-10-30 NOTE — ED Notes (Addendum)
Pt no distress at present.  States she is starting to feel better. Heart rate still in the 170's

## 2015-10-30 NOTE — ED Notes (Signed)
Second liter of normal saline bolus per Dr. Roderic Palau

## 2015-10-30 NOTE — ED Notes (Signed)
Pt having off and runs of SVT lasting 20-30 seconds each times.  Pt continues to have some sob.

## 2015-10-30 NOTE — ED Notes (Signed)
Dr, Roderic Palau at bed side

## 2015-10-30 NOTE — ED Provider Notes (Signed)
[facility] DEPT Provider Note   CSN: [id number] Arrival date & time: 10/30/15  1708  First Provider Contact:  None       History   Chief Complaint Chief Complaint  Patient presents with  . Shortness of Breath    HPI Patricia Jackson is a 75 y.o. female.  Patient complains of shortness of breath for a few days. Today she felt dizzy and weak   The history is provided by the patient. No language interpreter was used.  Shortness of Breath  This is a new problem. The problem occurs continuously.The current episode started 3 to 5 hours ago. The problem has not changed since onset.Pertinent negatives include no headaches, no ear pain, no cough, no chest pain, no abdominal pain and no rash. Precipitated by: Unknown. She has tried ipratropium inhalers for the symptoms. The treatment provided no relief. She has had prior hospitalizations. She has had prior ED visits. Associated medical issues do not include pneumonia.    Past Medical History:  Diagnosis Date  . Breast cancer ([place])   . COPD (chronic obstructive pulmonary disease) ([place])   . Hypertension     Patient Active Problem List   Diagnosis Date Noted  . Atrial flutter ([place]) 10/30/2015  . Dyspnea 11/26/2013  . Back pain 11/26/2013  . Arm pain 11/26/2013  . Chest pain 11/26/2013  . Hypertension     Past Surgical History:  Procedure Laterality Date  . APPENDECTOMY    . BREAST SURGERY      OB History    Gravida Para Term Preterm AB Living             1   SAB TAB Ectopic Multiple Live Births                   Home Medications    Prior to Admission medications   Medication Sig Start Date End Date Taking? Authorizing Provider  albuterol (PROVENTIL) (2.5 MG/3ML) 0.083% nebulizer solution Take 3 mLs (2.5 mg total) by nebulization every 6 (six) hours as needed for wheezing or shortness of breath. 07/07/14   Daleen Bo, MD  albuterol (PROVENTIL) (5 MG/ML) 0.5% nebulizer solution Take 0.5 mLs (2.5 mg total) by  nebulization every 6 (six) hours as needed for wheezing or shortness of breath. 07/07/14   Daleen Bo, MD  amLODipine (NORVASC) 10 MG tablet Take 10 mg by mouth daily. 06/26/13   Historical Provider, MD  aspirin EC 325 MG EC tablet Take 1 tablet (325 mg total) by mouth daily. 11/26/13   Radene Gunning, NP  doxycycline (VIBRAMYCIN) 100 MG capsule Take 1 capsule (100 mg total) by mouth 2 (two) times daily. One po bid x 7 days 07/07/14   Daleen Bo, MD  hydrOXYzine (VISTARIL) 25 MG capsule Take 25 mg by mouth 3 (three) times daily as needed. 06/19/14   Historical Provider, MD  losartan (COZAAR) 100 MG tablet Take 100 mg by mouth daily. 07/24/13   Historical Provider, MD  predniSONE (DELTASONE) 20 MG tablet Take 1 tablet (20 mg total) by mouth 2 (two) times daily. 07/07/14   Daleen Bo, MD  simvastatin (ZOCOR) 20 MG tablet Take 20 mg by mouth daily.    Historical Provider, MD    Family History Family History  Problem Relation Age of Onset  . Heart disease Mother   . COPD Father     Social History Social History  Substance Use Topics  . Smoking status: Current Every Day Smoker    Packs/day: 1.00  Years: 50.00    Types: Cigarettes  . Smokeless tobacco: Never Used  . Alcohol use No     Allergies   Sulfa antibiotics   Review of Systems Review of Systems  Constitutional: Negative for appetite change and fatigue.  HENT: Negative for congestion, ear discharge, ear pain and sinus pressure.   Eyes: Negative for discharge.  Respiratory: Positive for shortness of breath. Negative for cough.   Cardiovascular: Negative for chest pain.  Gastrointestinal: Negative for abdominal pain and diarrhea.  Genitourinary: Negative for frequency and hematuria.  Musculoskeletal: Negative for back pain.  Skin: Negative for rash.  Neurological: Negative for seizures and headaches.  Psychiatric/Behavioral: Negative for hallucinations.     Physical Exam Updated Vital Signs BP 98/73   Pulse (!) 180    Temp 97.9 F (36.6 C) (Oral)   Resp (!) 31   Ht 5\' 6"  (1.676 m)   Wt 115 lb (52.2 kg)   SpO2 100%   BMI 18.56 kg/m   Physical Exam  Constitutional: She is oriented to person, place, and time. She appears well-developed.  HENT:  Head: Normocephalic.  Eyes: Conjunctivae and EOM are normal. No scleral icterus.  Neck: Neck supple. No thyromegaly present.  Cardiovascular: Exam reveals no gallop and no friction rub.   No murmur heard. Patient has a irregular rapid heart rate  Pulmonary/Chest: No stridor. She has no wheezes. She has no rales. She exhibits no tenderness.  Abdominal: She exhibits no distension. There is no tenderness. There is no rebound.  Musculoskeletal: Normal range of motion. She exhibits no edema.  Lymphadenopathy:    She has no cervical adenopathy.  Neurological: She is oriented to person, place, and time. She exhibits normal muscle tone. Coordination normal.  Skin: No rash noted. No erythema.  Psychiatric: She has a normal mood and affect. Her behavior is normal.     ED Treatments / Results  Labs (all labs ordered are listed, but only abnormal results are displayed) Labs Reviewed  CBC WITH DIFFERENTIAL/PLATELET - Abnormal; Notable for the following:       Result Value   WBC 17.6 (*)    RDW 15.6 (*)    Neutro Abs 15.5 (*)    Lymphs Abs 0.5 (*)    Monocytes Absolute 1.6 (*)    All other components within normal limits  COMPREHENSIVE METABOLIC PANEL - Abnormal; Notable for the following:    Sodium 129 (*)    CO2 17 (*)    Glucose, Bld 164 (*)    BUN 26 (*)    Creatinine, Ser 1.42 (*)    Calcium 8.5 (*)    AST 45 (*)    GFR calc non Af Amer 36 (*)    GFR calc Af Amer 42 (*)    All other components within normal limits  I-STAT CHEM 8, ED - Abnormal; Notable for the following:    Sodium 134 (*)    BUN 25 (*)    Creatinine, Ser 1.30 (*)    Glucose, Bld 158 (*)    Calcium, Ion 1.08 (*)    Hemoglobin 15.3 (*)    All other components within normal  limits  I-STAT TROPOININ, ED  I-STAT CHEM 8, ED    EKG  EKG Interpretation None       Radiology Dg Chest Portable 1 View  Result Date: 10/30/2015 CLINICAL DATA:  Chest pain with shortness of breath and tachycardia. EXAM: PORTABLE CHEST 1 VIEW COMPARISON:  10/28/2015 FINDINGS: Cardiac silhouette is normal in size.  No mediastinal or hilar masses or convincing adenopathy. Lungs are hyperexpanded. Thickened interstitial markings are stable from prior exam. No evidence of pneumonia or pulmonary edema. No pleural effusion or pneumothorax. The bony thorax is demineralized but grossly intact. IMPRESSION: 1. No acute cardiopulmonary disease. 2. Lung hyperexpansion and chronic interstitial thickening without change from the prior study. Electronically Signed   By: Lajean Manes M.D.   On: 10/30/2015 17:55   Procedures Procedures (including critical care time)  Medications Ordered in ED Medications  dextrose 5 % with diltiazem (CARDIZEM) ADS Med (not administered)  metoprolol (LOPRESSOR) injection 5 mg (not administered)  adenosine (ADENOCARD) 6 MG/2ML injection 6 mg (6 mg Intravenous Given 10/30/15 1729)  adenosine (ADENOCARD) 6 MG/2ML injection 12 mg (12 mg Intravenous Given by Other 10/30/15 1731)  diltiazem (CARDIZEM) injection 10 mg (10 mg Intravenous Given 10/30/15 1746)  diltiazem (CARDIZEM) 100 mg in dextrose 5 % 100 mL (1 mg/mL) infusion (15 mg/hr Intravenous Rate/Dose Change 10/30/15 1816)  sodium chloride 0.9 % bolus 1,000 mL (0 mLs Intravenous Stopped 10/30/15 1821)  metoprolol (LOPRESSOR) 5 MG/5ML injection (5 mg  Given 10/30/15 1757)    CRITICAL CARE Performed by: Benigno Check L Total critical care time: 74minutes Critical care time was exclusive of separately billable procedures and treating other patients. Critical care was necessary to treat or prevent imminent or life-threatening deterioration. Critical care was time spent personally by me on the following activities:  development of treatment plan with patient and/or surrogate as well as nursing, discussions with consultants, evaluation of patient's response to treatment, examination of patient, obtaining history from patient or surrogate, ordering and performing treatments and interventions, ordering and review of laboratory studies, ordering and review of radiographic studies, pulse oximetry and re-evaluation of patient's condition.  Initial Impression / Assessment and Plan / ED Course  I have reviewed the triage vital signs and the nursing notes.  Pertinent labs & imaging results that were available during my care of the patient were reviewed by me and considered in my medical decision making (see chart for details).  Clinical Course    Patient was in a heart rate of 180. Narrow complex. Patient was given adenosine 6 mg that did nothing. She was then given adenosine 12 mg.  This showed slowed her heart rate down for a short while and showed she was in atrial flutter. Patient was then given Cardizem and Lopressor.  Patient's heart rate finally slowed down to about 100 and atrial flutter on a Cardizem drip. I spoke with the cardiologist Dr. Debara Pickett and he accepted the patient in transfer. . Final Clinical Impressions(s) / ED Diagnoses   Final diagnoses:  Atrial flutter with rapid ventricular response [facility])    New Prescriptions New Prescriptions   No medications on file     Milton Ferguson, MD 10/30/15 1836

## 2015-10-30 NOTE — H&P (Addendum)
CC: SOB  HPI: 75 yo woman, smoker 1 ppd, HTN, DLD, presented to [facility] ER with worsening cough, SOB and sputum production for the past 1 weeks. No fever, N/V, diaphoresis, syncope. Reports palpitations for the past couple days. At the outside ER, was noted to be in SVT with V rate in 180's, not terminated with Adenosine 6 mg and 12 mg IV but P waves appeared to be atrial flutter (AFL) per  Records. Dilt infusion was initiated. WBC 17, Na 129, Trop negative.    Review of Systems:  10 systems reviewed unremarkable except as noted in HPI    Past Medical History:  Diagnosis Date  . Breast cancer ([place])   . COPD (chronic obstructive pulmonary disease) ([place])   . Hypertension     No current facility-administered medications on file prior to encounter.    Current Outpatient Prescriptions on File Prior to Encounter  Medication Sig Dispense Refill  . albuterol (PROVENTIL) (2.5 MG/3ML) 0.083% nebulizer solution Take 3 mLs (2.5 mg total) by nebulization every 6 (six) hours as needed for wheezing or shortness of breath. 75 mL 12  . albuterol (PROVENTIL) (5 MG/ML) 0.5% nebulizer solution Take 0.5 mLs (2.5 mg total) by nebulization every 6 (six) hours as needed for wheezing or shortness of breath. 20 mL 12  . amLODipine (NORVASC) 10 MG tablet Take 10 mg by mouth daily.    Marland Kitchen aspirin EC 325 MG EC tablet Take 1 tablet (325 mg total) by mouth daily. 30 tablet 0  . doxycycline (VIBRAMYCIN) 100 MG capsule Take 1 capsule (100 mg total) by mouth 2 (two) times daily. One po bid x 7 days 14 capsule 0  . hydrOXYzine (VISTARIL) 25 MG capsule Take 25 mg by mouth 3 (three) times daily as needed.    Marland Kitchen losartan (COZAAR) 100 MG tablet Take 100 mg by mouth daily.    . predniSONE (DELTASONE) 20 MG tablet Take 1 tablet (20 mg total) by mouth 2 (two) times daily. 10 tablet 0  . simvastatin (ZOCOR) 20 MG tablet Take 20 mg by mouth daily.       Allergies  Allergen Reactions  . Sulfa Antibiotics Hives     Social History   Social History  . Marital status: Divorced    Spouse name: N/A  . Number of children: N/A  . Years of education: N/A   Occupational History  . Not on file.   Social History Main Topics  . Smoking status: Current Every Day Smoker    Packs/day: 1.00    Years: 50.00    Types: Cigarettes  . Smokeless tobacco: Never Used  . Alcohol use No  . Drug use: No  . Sexual activity: Not Currently   Other Topics Concern  . Not on file   Social History Narrative  . No narrative on file    Family History  Problem Relation Age of Onset  . Heart disease Mother   . COPD Father     PHYSICAL EXAM: Vitals:   10/30/15 2115 10/30/15 2130  BP:    Pulse: 95 (!) 103  Resp: (!) 32 (!) 30  Temp:     General: frail, thin built HEENT: normal Neck: supple. no JVD. Carotids 2+ bilat; no bruits. No lymphadenopathy or thryomegaly appreciated. Cor: PMI nondisplaced. Irregular rate & rhythm. No rubs, gallops or murmurs. Lungs: ronchi and basal rales, exp wheezing  Abdomen: soft, nontender, nondistended. No hepatosplenomegaly. No bruits or masses. Good bowel sounds. Extremities: no cyanosis, clubbing, rash,  edema Neuro: alert & oriented x 3, cranial nerves grossly intact. moves all 4 extremities w/o difficulty. Affect pleasant.  ECG: AFL with RVR   Results for orders placed or performed during the hospital encounter of 10/30/15 (from the past 24 hour(s))  CBC with Differential/Platelet     Status: Abnormal   Collection Time: 10/30/15  5:40 PM  Result Value Ref Range   WBC 17.6 (H) 4.0 - 10.5 K/uL   RBC 4.80 3.87 - 5.11 MIL/uL   Hemoglobin 13.9 12.0 - 15.0 g/dL   HCT 41.4 36.0 - 46.0 %   MCV 86.3 78.0 - 100.0 fL   MCH 29.0 26.0 - 34.0 pg   MCHC 33.6 30.0 - 36.0 g/dL   RDW 15.6 (H) 11.5 - 15.5 %   Platelets 181 150 - 400 K/uL   Neutrophils Relative % 88 %   Neutro Abs 15.5 (H) 1.7 - 7.7 K/uL   Lymphocytes Relative 3 %   Lymphs Abs 0.5 (L) 0.7 - 4.0 K/uL    Monocytes Relative 9 %   Monocytes Absolute 1.6 (H) 0.1 - 1.0 K/uL   Eosinophils Relative 0 %   Eosinophils Absolute 0.0 0.0 - 0.7 K/uL   Basophils Relative 0 %   Basophils Absolute 0.0 0.0 - 0.1 K/uL  Comprehensive metabolic panel     Status: Abnormal   Collection Time: 10/30/15  5:40 PM  Result Value Ref Range   Sodium 129 (L) 135 - 145 mmol/L   Potassium 3.9 3.5 - 5.1 mmol/L   Chloride 102 101 - 111 mmol/L   CO2 17 (L) 22 - 32 mmol/L   Glucose, Bld 164 (H) 65 - 99 mg/dL   BUN 26 (H) 6 - 20 mg/dL   Creatinine, Ser 1.42 (H) 0.44 - 1.00 mg/dL   Calcium 8.5 (L) 8.9 - 10.3 mg/dL   Total Protein 8.0 6.5 - 8.1 g/dL   Albumin 3.7 3.5 - 5.0 g/dL   AST 45 (H) 15 - 41 U/L   ALT 22 14 - 54 U/L   Alkaline Phosphatase 77 38 - 126 U/L   Total Bilirubin 0.7 0.3 - 1.2 mg/dL   GFR calc non Af Amer 36 (L) >60 mL/min   GFR calc Af Amer 42 (L) >60 mL/min   Anion gap 10 5 - 15  I-stat troponin, ED     Status: None   Collection Time: 10/30/15  5:52 PM  Result Value Ref Range   Troponin i, poc 0.01 0.00 - 0.08 ng/mL   Comment 3          I-stat chem 8, ed     Status: Abnormal   Collection Time: 10/30/15  5:59 PM  Result Value Ref Range   Sodium 134 (L) 135 - 145 mmol/L   Potassium 3.9 3.5 - 5.1 mmol/L   Chloride 102 101 - 111 mmol/L   BUN 25 (H) 6 - 20 mg/dL   Creatinine, Ser 1.30 (H) 0.44 - 1.00 mg/dL   Glucose, Bld 158 (H) 65 - 99 mg/dL   Calcium, Ion 1.08 (L) 1.12 - 1.23 mmol/L   TCO2 17 0 - 100 mmol/L   Hemoglobin 15.3 (H) 12.0 - 15.0 g/dL   HCT 45.0 36.0 - 46.0 %   Dg Chest Portable 1 View  Result Date: 10/30/2015 CLINICAL DATA:  Chest pain with shortness of breath and tachycardia. EXAM: PORTABLE CHEST 1 VIEW COMPARISON:  10/28/2015 FINDINGS: Cardiac silhouette is normal in size. No mediastinal or hilar masses or convincing adenopathy. Lungs  are hyperexpanded. Thickened interstitial markings are stable from prior exam. No evidence of pneumonia or pulmonary edema. No pleural effusion or  pneumothorax. The bony thorax is demineralized but grossly intact. IMPRESSION: 1. No acute cardiopulmonary disease. 2. Lung hyperexpansion and chronic interstitial thickening without change from the prior study. Electronically Signed   By: Lajean Manes M.D.   On: 10/30/2015 17:55    ASSESSMENT:  1. AFL with RVR in the setting of what appears to be COPD exacerbation - currently on Dilt 15 mg/hr with reasonable rate control ( V rate in 90-110) - CHA2DS2-VASc score 3 (age, gender, HTN) - History of HTN, - Smoker 1 ppd for many years  2. Leukocytosis - COPD exacerbation; also will be concerned for developing community acquired pneumonia  3. Hyponatremia - etiology not clear at this time     PLAN/DISCUSSION:  Admitted to cardiology  Continue Dilt infusion Will start Eliquis 5 mg po bid  Plan for TEE+DCCV in am if remains in AFL; may need anesthesia consult given her COPD Atrovent Nebs, O2 Blood Cx x2, UA Start Ceftr 1 g IV qd, Azithro 500 mg IV qd  Please see orders for details  Wandra Mannan, MD Cardiology

## 2015-10-30 NOTE — ED Notes (Signed)
Dr. Roderic Palau okayed to increase cardizem to 10 mg/hr

## 2015-10-31 ENCOUNTER — Inpatient Hospital Stay (HOSPITAL_COMMUNITY): Payer: Medicare Other

## 2015-10-31 DIAGNOSIS — J8410 Pulmonary fibrosis, unspecified: Secondary | ICD-10-CM

## 2015-10-31 DIAGNOSIS — R778 Other specified abnormalities of plasma proteins: Secondary | ICD-10-CM | POA: Diagnosis present

## 2015-10-31 DIAGNOSIS — E785 Hyperlipidemia, unspecified: Secondary | ICD-10-CM | POA: Diagnosis present

## 2015-10-31 DIAGNOSIS — R0600 Dyspnea, unspecified: Secondary | ICD-10-CM

## 2015-10-31 DIAGNOSIS — J440 Chronic obstructive pulmonary disease with acute lower respiratory infection: Principal | ICD-10-CM

## 2015-10-31 DIAGNOSIS — I4892 Unspecified atrial flutter: Secondary | ICD-10-CM

## 2015-10-31 DIAGNOSIS — R7989 Other specified abnormal findings of blood chemistry: Secondary | ICD-10-CM | POA: Diagnosis present

## 2015-10-31 DIAGNOSIS — J42 Unspecified chronic bronchitis: Secondary | ICD-10-CM

## 2015-10-31 DIAGNOSIS — J189 Pneumonia, unspecified organism: Secondary | ICD-10-CM

## 2015-10-31 DIAGNOSIS — E782 Mixed hyperlipidemia: Secondary | ICD-10-CM | POA: Diagnosis present

## 2015-10-31 DIAGNOSIS — J441 Chronic obstructive pulmonary disease with (acute) exacerbation: Secondary | ICD-10-CM | POA: Diagnosis present

## 2015-10-31 DIAGNOSIS — J209 Acute bronchitis, unspecified: Secondary | ICD-10-CM | POA: Diagnosis present

## 2015-10-31 LAB — LACTIC ACID, PLASMA
Lactic Acid, Venous: 1.4 mmol/L (ref 0.5–1.9)
Lactic Acid, Venous: 1.2 mmol/L (ref 0.5–1.9)

## 2015-10-31 LAB — BASIC METABOLIC PANEL
Anion gap: 10 (ref 5–15)
BUN: 20 mg/dL (ref 6–20)
CO2: 16 mmol/L — ABNORMAL LOW (ref 22–32)
Calcium: 7.3 mg/dL — ABNORMAL LOW (ref 8.9–10.3)
Chloride: 109 mmol/L (ref 101–111)
Creatinine, Ser: 1.11 mg/dL — ABNORMAL HIGH (ref 0.44–1.00)
GFR calc Af Amer: 56 mL/min — ABNORMAL LOW (ref >60)
GFR calc non Af Amer: 48 mL/min — ABNORMAL LOW (ref >60)
Glucose, Bld: 139 mg/dL — ABNORMAL HIGH (ref 65–99)
Potassium: 3.7 mmol/L (ref 3.5–5.1)
Sodium: 135 mmol/L (ref 135–145)

## 2015-10-31 LAB — URINE MICROSCOPIC-ADD ON

## 2015-10-31 LAB — URINALYSIS, ROUTINE W REFLEX MICROSCOPIC
Bilirubin Urine: NEGATIVE
Glucose, UA: NEGATIVE mg/dL
Hgb urine dipstick: NEGATIVE
Ketones, ur: 15 mg/dL — AB
Nitrite: NEGATIVE
Protein, ur: 30 mg/dL — AB
Specific Gravity, Urine: 1.017 (ref 1.005–1.030)
pH: 6 (ref 5.0–8.0)

## 2015-10-31 LAB — CBC
HCT: 32.9 % — ABNORMAL LOW (ref 36.0–46.0)
Hemoglobin: 11 g/dL — ABNORMAL LOW (ref 12.0–15.0)
MCH: 28.5 pg (ref 26.0–34.0)
MCHC: 33.4 g/dL (ref 30.0–36.0)
MCV: 85.2 fL (ref 78.0–100.0)
Platelets: 158 10*3/uL (ref 150–400)
RBC: 3.86 MIL/uL — ABNORMAL LOW (ref 3.87–5.11)
RDW: 16 % — ABNORMAL HIGH (ref 11.5–15.5)
WBC: 12.6 10*3/uL — ABNORMAL HIGH (ref 4.0–10.5)

## 2015-10-31 LAB — TSH: TSH: 1.928 u[IU]/mL (ref 0.350–4.500)

## 2015-10-31 LAB — CREATININE, URINE, RANDOM: Creatinine, Urine: 116.15 mg/dL

## 2015-10-31 LAB — HEPARIN LEVEL (UNFRACTIONATED): Heparin Unfractionated: >2.2 IU/mL — ABNORMAL HIGH (ref 0.30–0.70)

## 2015-10-31 LAB — HEPATIC FUNCTION PANEL
ALT: 44 U/L (ref 14–54)
AST: 69 U/L — ABNORMAL HIGH (ref 15–41)
Albumin: 2.4 g/dL — ABNORMAL LOW (ref 3.5–5.0)
Alkaline Phosphatase: 191 U/L — ABNORMAL HIGH (ref 38–126)
Bilirubin, Direct: 0.2 mg/dL (ref 0.1–0.5)
Indirect Bilirubin: 0.3 mg/dL (ref 0.3–0.9)
Total Bilirubin: 0.5 mg/dL (ref 0.3–1.2)
Total Protein: 5.4 g/dL — ABNORMAL LOW (ref 6.5–8.1)

## 2015-10-31 LAB — TROPONIN I
Troponin I: 0.04 ng/mL (ref <0.03)
Troponin I: 0.09 ng/mL (ref <0.03)
Troponin I: 0.09 ng/mL (ref <0.03)

## 2015-10-31 LAB — LIPID PANEL
Cholesterol: 75 mg/dL (ref 0–200)
HDL: 23 mg/dL — ABNORMAL LOW (ref >40)
LDL Cholesterol: 37 mg/dL (ref 0–99)
Total CHOL/HDL Ratio: 3.3 RATIO
Triglycerides: 74 mg/dL (ref <150)
VLDL: 15 mg/dL (ref 0–40)

## 2015-10-31 LAB — SODIUM, URINE, RANDOM: Sodium, Ur: 21 mmol/L

## 2015-10-31 LAB — OSMOLALITY, URINE: Osmolality, Ur: 484 mOsm/kg (ref 300–900)

## 2015-10-31 LAB — MAGNESIUM: Magnesium: 1.9 mg/dL (ref 1.7–2.4)

## 2015-10-31 LAB — STREP PNEUMONIAE URINARY ANTIGEN: Strep Pneumo Urinary Antigen: NEGATIVE

## 2015-10-31 LAB — OSMOLALITY: Osmolality: 281 mOsm/kg (ref 275–295)

## 2015-10-31 MED ORDER — IPRATROPIUM BROMIDE 0.02 % IN SOLN
0.5000 mg | Freq: Four times a day (QID) | RESPIRATORY_TRACT | Status: DC
  Administered 2015-10-31 – 2015-11-02 (×10): 0.5 mg via RESPIRATORY_TRACT
  Filled 2015-10-31 (×10): qty 2.5

## 2015-10-31 MED ORDER — DILTIAZEM LOAD VIA INFUSION
10.0000 mg | Freq: Once | INTRAVENOUS | Status: AC
  Administered 2015-10-31: 10 mg via INTRAVENOUS
  Filled 2015-10-31: qty 10

## 2015-10-31 MED ORDER — METHYLPREDNISOLONE SODIUM SUCC 125 MG IJ SOLR
40.0000 mg | Freq: Two times a day (BID) | INTRAMUSCULAR | Status: DC
  Administered 2015-10-31 – 2015-11-02 (×5): 40 mg via INTRAVENOUS
  Filled 2015-10-31 (×5): qty 2

## 2015-10-31 MED ORDER — DILTIAZEM HCL 25 MG/5ML IV SOLN: 10.0000 mg | Freq: Once | INTRAVENOUS | Status: DC

## 2015-10-31 MED ORDER — SODIUM CHLORIDE 0.9 % IV SOLN
250.0000 mL | Freq: Once | INTRAVENOUS | Status: AC
  Administered 2015-10-31: 250 mL via INTRAVENOUS

## 2015-10-31 MED ORDER — ALBUTEROL SULFATE (2.5 MG/3ML) 0.083% IN NEBU
2.5000 mg | INHALATION_SOLUTION | RESPIRATORY_TRACT | Status: DC | PRN
Start: 2015-10-31 — End: 2015-11-08
  Administered 2015-11-01 – 2015-11-03 (×4): 2.5 mg via RESPIRATORY_TRACT
  Filled 2015-10-31 (×5): qty 3

## 2015-10-31 MED ORDER — METOPROLOL TARTRATE 5 MG/5ML IV SOLN
5.0000 mg | Freq: Once | INTRAVENOUS | Status: AC
  Administered 2015-10-31: 5 mg via INTRAVENOUS

## 2015-10-31 MED ORDER — HEPARIN (PORCINE) IN NACL 100-0.45 UNIT/ML-% IJ SOLN
900.0000 [IU]/h | INTRAMUSCULAR | Status: DC
  Administered 2015-10-31: 700 [IU]/h via INTRAVENOUS
  Filled 2015-10-31: qty 250

## 2015-10-31 MED ORDER — ENSURE ENLIVE PO LIQD
237.0000 mL | Freq: Two times a day (BID) | ORAL | Status: DC
  Administered 2015-10-31 – 2015-11-08 (×17): 237 mL via ORAL

## 2015-10-31 NOTE — Progress Notes (Signed)
CRITICAL VALUE ALERT  Critical value received:  Troponin 0.04  Date of notification:  10/31/2015  Time of notification:  0015  Critical value read back:Yes.    Nurse who received alert:  Eleonore Chiquito RN  MD notified (1st page):  Azeem  Time of first page:  0030  MD notified (2nd page): Azeem   Time of second page: 0045  Responding MD:  Susy [place]  Time MD responded:  [id number]

## 2015-10-31 NOTE — Progress Notes (Signed)
DAILY PROGRESS NOTE  Subjective:  Events noted overnight. Has been hypotensive on diltiazem gtts - HR around 90-100. Also with leukocytosis, low grade temps - possible acute exacerbation of chronic bronchitis/pneumonia. Repeat CXR this morning shows COPD with fibrosis and possible early interstitial edema/pneumonia. Improved leukocytosis overnight to 12.6 (from 17.6). Tmax 99.5. Small troponin elevation (0.04, 0.09). Creatinine improved. Venous lactate is low. TSH normal. Some WBC's and bacteria in the urine, but on antibiotic coverage for this and CAP/COPD exacerbation. Blood cultures are pending.   Objective:  Temp:  [97.3 F (36.3 C)-99.5 F (37.5 C)] 97.3 F (36.3 C) (07/28 0409) Pulse Rate:  [49-180] 100 (07/28 0715) Resp:  [20-41] 24 (07/28 0715) BP: (70-130)/(40-98) 94/59 (07/28 0700) SpO2:  [88 %-100 %] 90 % (07/28 0715) Weight:  [115 lb (52.2 kg)-128 lb 12 oz (58.4 kg)] 128 lb 12 oz (58.4 kg) (07/28 0500) Weight change:   Intake/Output from previous day: 07/27 0701 - 07/28 0700 In: 534.8 [P.O.:100; I.V.:134.8; IV Piggyback:300] Out: 800 [Urine:800]  Intake/Output from this shift: No intake/output data recorded.  Medications: Current Facility-Administered Medications  Medication Dose Route Frequency Provider Last Rate Last Dose  . acetaminophen (TYLENOL) tablet 650 mg  650 mg Oral Q4H PRN Wandra Mannan, MD      . apixaban (ELIQUIS) tablet 5 mg  5 mg Oral BID Wandra Mannan, MD   5 mg at 10/30/15 2301  . aspirin EC tablet 81 mg  81 mg Oral Daily Wandra Mannan, MD      . azithromycin (ZITHROMAX) 500 mg in dextrose 5 % 250 mL IVPB  500 mg Intravenous Q24H Wandra Mannan, MD   500 mg at 10/31/15 0012  . cefTRIAXone (ROCEPHIN) 1 g in dextrose 5 % 50 mL IVPB  1 g Intravenous Q2200 Wandra Mannan, MD   1 g at 10/30/15 2340  . diltiazem (CARDIZEM) 100 mg in dextrose 5 % 100 mL (1 mg/mL) infusion  5-15 mg/hr Intravenous Titrated Wandra Mannan, MD 5 mL/hr at 10/31/15 0242 5 mg/hr at 10/31/15 0242    . guaiFENesin (ROBITUSSIN) 100 MG/5ML solution 100 mg  5 mL Oral Q6H PRN Wandra Mannan, MD   100 mg at 10/30/15 2252  . HYDROcodone-acetaminophen (NORCO/VICODIN) 5-325 MG per tablet 1 tablet  1 tablet Oral Q6H PRN Wandra Mannan, MD   1 tablet at 10/31/15 0452  . ipratropium (ATROVENT) nebulizer solution 0.5 mg  0.5 mg Nebulization Q4H PRN Wandra Mannan, MD   0.5 mg at 10/30/15 2327  . simvastatin (ZOCOR) tablet 20 mg  20 mg Oral q1800 Wandra Mannan, MD        Physical Exam: General appearance: alert, cachectic, moderate distress and pale Neck: JVD - 2 cm above sternal notch and no carotid bruit Lungs: diminished breath sounds bilaterally, dullness to percussion bibasilar and rhonchi RLL Heart: irregularly irregular rhythm and tachycardic Abdomen: soft, non-tender; bowel sounds normal; no masses,  no organomegaly Extremities: extremities normal, atraumatic, no cyanosis or edema Pulses: 2+ and symmetric Skin: pale, cool dry Neurologic: Mental status: Alert, oriented, thought content appropriate Psych: Appears anxious  Lab Results: Results for orders placed or performed during the hospital encounter of 10/30/15 (from the past 48 hour(s))  CBC with Differential/Platelet     Status: Abnormal   Collection Time: 10/30/15  5:40 PM  Result Value Ref Range   WBC 17.6 (H) 4.0 - 10.5 K/uL   RBC 4.80 3.87 - 5.11 MIL/uL   Hemoglobin 13.9 12.0 - 15.0 g/dL   HCT 41.4 36.0 -  46.0 %   MCV 86.3 78.0 - 100.0 fL   MCH 29.0 26.0 - 34.0 pg   MCHC 33.6 30.0 - 36.0 g/dL   RDW 15.6 (H) 11.5 - 15.5 %   Platelets 181 150 - 400 K/uL   Neutrophils Relative % 88 %   Neutro Abs 15.5 (H) 1.7 - 7.7 K/uL   Lymphocytes Relative 3 %   Lymphs Abs 0.5 (L) 0.7 - 4.0 K/uL   Monocytes Relative 9 %   Monocytes Absolute 1.6 (H) 0.1 - 1.0 K/uL   Eosinophils Relative 0 %   Eosinophils Absolute 0.0 0.0 - 0.7 K/uL   Basophils Relative 0 %   Basophils Absolute 0.0 0.0 - 0.1 K/uL  Comprehensive metabolic panel     Status: Abnormal    Collection Time: 10/30/15  5:40 PM  Result Value Ref Range   Sodium 129 (L) 135 - 145 mmol/L   Potassium 3.9 3.5 - 5.1 mmol/L   Chloride 102 101 - 111 mmol/L   CO2 17 (L) 22 - 32 mmol/L   Glucose, Bld 164 (H) 65 - 99 mg/dL   BUN 26 (H) 6 - 20 mg/dL   Creatinine, Ser 1.42 (H) 0.44 - 1.00 mg/dL   Calcium 8.5 (L) 8.9 - 10.3 mg/dL   Total Protein 8.0 6.5 - 8.1 g/dL   Albumin 3.7 3.5 - 5.0 g/dL   AST 45 (H) 15 - 41 U/L   ALT 22 14 - 54 U/L   Alkaline Phosphatase 77 38 - 126 U/L   Total Bilirubin 0.7 0.3 - 1.2 mg/dL   GFR calc non Af Amer 36 (L) >60 mL/min   GFR calc Af Amer 42 (L) >60 mL/min    Comment: (NOTE) The eGFR has been calculated using the CKD EPI equation. This calculation has not been validated in all clinical situations. eGFR's persistently <60 mL/min signify possible Chronic Kidney Disease.    Anion gap 10 5 - 15  I-stat troponin, ED     Status: None   Collection Time: 10/30/15  5:52 PM  Result Value Ref Range   Troponin i, poc 0.01 0.00 - 0.08 ng/mL   Comment 3            Comment: Due to the release kinetics of cTnI, a negative result within the first hours of the onset of symptoms does not rule out myocardial infarction with certainty. If myocardial infarction is still suspected, repeat the test at appropriate intervals.   I-stat chem 8, ed     Status: Abnormal   Collection Time: 10/30/15  5:59 PM  Result Value Ref Range   Sodium 134 (L) 135 - 145 mmol/L   Potassium 3.9 3.5 - 5.1 mmol/L   Chloride 102 101 - 111 mmol/L   BUN 25 (H) 6 - 20 mg/dL   Creatinine, Ser 1.30 (H) 0.44 - 1.00 mg/dL   Glucose, Bld 158 (H) 65 - 99 mg/dL   Calcium, Ion 1.08 (L) 1.12 - 1.23 mmol/L   TCO2 17 0 - 100 mmol/L   Hemoglobin 15.3 (H) 12.0 - 15.0 g/dL   HCT 45.0 36.0 - 46.0 %  MRSA PCR Screening     Status: None   Collection Time: 10/30/15  9:28 PM  Result Value Ref Range   MRSA by PCR NEGATIVE NEGATIVE    Comment:        The GeneXpert MRSA Assay (FDA approved for NASAL  specimens only), is one component of a comprehensive MRSA colonization surveillance program. It is  not intended to diagnose MRSA infection nor to guide or monitor treatment for MRSA infections.   Magnesium     Status: None   Collection Time: 10/30/15 11:19 PM  Result Value Ref Range   Magnesium 1.9 1.7 - 2.4 mg/dL  Hepatic function panel     Status: Abnormal   Collection Time: 10/30/15 11:19 PM  Result Value Ref Range   Total Protein 5.4 (L) 6.5 - 8.1 g/dL   Albumin 2.4 (L) 3.5 - 5.0 g/dL   AST 69 (H) 15 - 41 U/L   ALT 44 14 - 54 U/L   Alkaline Phosphatase 191 (H) 38 - 126 U/L   Total Bilirubin 0.5 0.3 - 1.2 mg/dL   Bilirubin, Direct 0.2 0.1 - 0.5 mg/dL   Indirect Bilirubin 0.3 0.3 - 0.9 mg/dL  TSH     Status: None   Collection Time: 10/30/15 11:19 PM  Result Value Ref Range   TSH 1.928 0.350 - 4.500 uIU/mL  Troponin I     Status: Abnormal   Collection Time: 10/30/15 11:19 PM  Result Value Ref Range   Troponin I 0.04 (HH) <0.03 ng/mL    Comment: CRITICAL RESULT CALLED TO, READ BACK BY AND VERIFIED WITH: DENNIS,K RN 10/31/2015 0025 JORDANS   Osmolality     Status: None   Collection Time: 10/30/15 11:19 PM  Result Value Ref Range   Osmolality 281 275 - 295 mOsm/kg  Lactic acid, plasma     Status: None   Collection Time: 10/31/15  2:30 AM  Result Value Ref Range   Lactic Acid, Venous 1.4 0.5 - 1.9 mmol/L  Troponin I     Status: Abnormal   Collection Time: 10/31/15  4:38 AM  Result Value Ref Range   Troponin I 0.09 (HH) <0.03 ng/mL    Comment: CRITICAL VALUE NOTED.  VALUE IS CONSISTENT WITH PREVIOUSLY REPORTED AND CALLED VALUE.  Basic metabolic panel     Status: Abnormal   Collection Time: 10/31/15  4:38 AM  Result Value Ref Range   Sodium 135 135 - 145 mmol/L   Potassium 3.7 3.5 - 5.1 mmol/L   Chloride 109 101 - 111 mmol/L   CO2 16 (L) 22 - 32 mmol/L   Glucose, Bld 139 (H) 65 - 99 mg/dL   BUN 20 6 - 20 mg/dL   Creatinine, Ser 1.11 (H) 0.44 - 1.00 mg/dL   Calcium  7.3 (L) 8.9 - 10.3 mg/dL   GFR calc non Af Amer 48 (L) >60 mL/min   GFR calc Af Amer 56 (L) >60 mL/min    Comment: (NOTE) The eGFR has been calculated using the CKD EPI equation. This calculation has not been validated in all clinical situations. eGFR's persistently <60 mL/min signify possible Chronic Kidney Disease.    Anion gap 10 5 - 15  Lipid panel     Status: Abnormal   Collection Time: 10/31/15  4:38 AM  Result Value Ref Range   Cholesterol 75 0 - 200 mg/dL   Triglycerides 74 <150 mg/dL   HDL 23 (L) >40 mg/dL   Total CHOL/HDL Ratio 3.3 RATIO   VLDL 15 0 - 40 mg/dL   LDL Cholesterol 37 0 - 99 mg/dL    Comment:        Total Cholesterol/HDL:CHD Risk Coronary Heart Disease Risk Table                     Men   Women  1/2 Average Risk   3.4  3.3  Average Risk       5.0   4.4  2 X Average Risk   9.6   7.1  3 X Average Risk  23.4   11.0        Use the calculated Patient Ratio above and the CHD Risk Table to determine the patient's CHD Risk.        ATP III CLASSIFICATION (LDL):  <100     mg/dL   Optimal  100-129  mg/dL   Near or Above                    Optimal  130-159  mg/dL   Borderline  160-189  mg/dL   High  >190     mg/dL   Very High   CBC     Status: Abnormal   Collection Time: 10/31/15  4:38 AM  Result Value Ref Range   WBC 12.6 (H) 4.0 - 10.5 K/uL   RBC 3.86 (L) 3.87 - 5.11 MIL/uL   Hemoglobin 11.0 (L) 12.0 - 15.0 g/dL   HCT 32.9 (L) 36.0 - 46.0 %   MCV 85.2 78.0 - 100.0 fL   MCH 28.5 26.0 - 34.0 pg   MCHC 33.4 30.0 - 36.0 g/dL   RDW 16.0 (H) 11.5 - 15.5 %   Platelets 158 150 - 400 K/uL  Lactic acid, plasma     Status: None   Collection Time: 10/31/15  4:38 AM  Result Value Ref Range   Lactic Acid, Venous 1.2 0.5 - 1.9 mmol/L  Urinalysis, Routine w reflex microscopic (not at [facility])     Status: Abnormal   Collection Time: 10/31/15  6:33 AM  Result Value Ref Range   Color, Urine AMBER (A) YELLOW    Comment: BIOCHEMICALS MAY BE AFFECTED BY COLOR    APPearance CLOUDY (A) CLEAR   Specific Gravity, Urine 1.017 1.005 - 1.030   pH 6.0 5.0 - 8.0   Glucose, UA NEGATIVE NEGATIVE mg/dL   Hgb urine dipstick NEGATIVE NEGATIVE   Bilirubin Urine NEGATIVE NEGATIVE   Ketones, ur 15 (A) NEGATIVE mg/dL   Protein, ur 30 (A) NEGATIVE mg/dL   Nitrite NEGATIVE NEGATIVE   Leukocytes, UA MODERATE (A) NEGATIVE  Osmolality, urine     Status: None   Collection Time: 10/31/15  6:33 AM  Result Value Ref Range   Osmolality, Ur 484 300 - 900 mOsm/kg  Creatinine, urine, random     Status: None   Collection Time: 10/31/15  6:33 AM  Result Value Ref Range   Creatinine, Urine 116.15 mg/dL  Sodium, urine, random     Status: None   Collection Time: 10/31/15  6:33 AM  Result Value Ref Range   Sodium, Ur 21 mmol/L  Urine microscopic-add on     Status: Abnormal   Collection Time: 10/31/15  6:33 AM  Result Value Ref Range   Squamous Epithelial / LPF 6-30 (A) NONE SEEN   WBC, UA 6-30 0 - 5 WBC/hpf   RBC / HPF 0-5 0 - 5 RBC/hpf   Bacteria, UA FEW (A) NONE SEEN   Casts HYALINE CASTS (A) NEGATIVE   Urine-Other MUCOUS PRESENT     Imaging: Dg Chest Port 1 View  Result Date: 10/31/2015 CLINICAL DATA:  Respiratory failure, shortness of breath, history of COPD, breast malignancy, atrial flutter, current smoker. EXAM: PORTABLE CHEST 1 VIEW COMPARISON:  Portable chest x-ray of October 30, 2015, October 28, 2015 and November 26, 2013. FINDINGS: The lungs remain hyperinflated.  The interstitial markings remain increased diffusely. There is no alveolar infiltrate. No pleural effusion is observed but the costophrenic angle on the left is not excluded from the field of view. External pacemaker defibrillator pads are present. The cardiac silhouette is normal in size. The pulmonary vascularity is not engorged. There are calcifications in the wall of the aortic arch. There surgical clips in the high left axillary region. IMPRESSION: COPD with pulmonary fibrotic changes. These interstitial  densities are more conspicuous than on the study of July 25th which may reflect of element of acute interstitial edema or pneumonia. There has been significant overall increase in the prominence of the pulmonary interstitium since the November 26, 2013 study. Electronically Signed   By: David  Martinique M.D.   On: 10/31/2015 07:27  Dg Chest Portable 1 View  Result Date: 10/30/2015 CLINICAL DATA:  Chest pain with shortness of breath and tachycardia. EXAM: PORTABLE CHEST 1 VIEW COMPARISON:  10/28/2015 FINDINGS: Cardiac silhouette is normal in size. No mediastinal or hilar masses or convincing adenopathy. Lungs are hyperexpanded. Thickened interstitial markings are stable from prior exam. No evidence of pneumonia or pulmonary edema. No pleural effusion or pneumothorax. The bony thorax is demineralized but grossly intact. IMPRESSION: 1. No acute cardiopulmonary disease. 2. Lung hyperexpansion and chronic interstitial thickening without change from the prior study. Electronically Signed   By: Lajean Manes M.D.   On: 10/30/2015 17:55   Assessment:  Principal Problem:   Acute exacerbation of chronic bronchitis (HCC) Active Problems:   Dyspnea   Atrial flutter (HCC)   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HCC)   COPD (chronic obstructive pulmonary disease) (HCC)   Dyslipidemia   Plan:  1. Atrial flutter/atrial fibrillation - rate is reasonably well controlled on IV cardizem 5 mg/hr. Has been hypotensive overnight. Lactate is low, blood cultures pending. Afebrile. Started on Eliquis - will need 5 doses and pulmonary optimization prior to consideration of cardioversion as onset of flutter is unknown (she reports feeling unwell most of last week). 2. COPD/Pulmonary fibrosis - findings concerning for acute exacerbation of COPD/early pneumonia - increased interstitial prominence. Started on rocephin/azithromycin. May benefit from steroids. Will ask for pulmonary/critical care evaluation today - d/w Dr. Elsworth Soho and he will  see. 3. Elevated troponin - mild elevation, could be demand ischemia due to rate. Check 2D echo today. 4. Dyslipidemia - continue simvastatin.  Time Spent Directly with Patient:  15 minutes  Length of Stay:  LOS: 1 day   Pixie Casino, MD, [facility] Attending Cardiologist [place] 10/31/2015, 8:26 AM

## 2015-10-31 NOTE — Progress Notes (Addendum)
Initial Nutrition Assessment  DOCUMENTATION CODES:   Not applicable  INTERVENTION:    Ensure Enlive po BID, each supplement provides 350 kcal and 20 grams of protein  NUTRITION DIAGNOSIS:   Increased nutrient needs related to chronic illness as evidenced by estimated needs  GOAL:   Patient will meet greater than or equal to 90% of their needs  MONITOR:   PO intake, Supplement acceptance, Labs, Weight trends, I & O's  REASON FOR ASSESSMENT:   Malnutrition Screening Tool  ASSESSMENT:   75 yo Female, smoker 1 ppd, HTN, DLD, presented to [facility] ER with worsening cough, SOB and sputum production for the past 1 weeks. No fever, N/V, diaphoresis, syncope. Reports palpitations for the past couple days. At the outside ER, was noted to be in SVT with V rate in 180's, not terminated with Adenosine 6 mg and 12 mg IV but P waves appeared to be atrial flutter (AFL) per  Records. Dilt infusion was initiated. WBC 17, Na 129, Trop negative.   Patient sleeping upon RD visit, quietly moaning. Per Malnutrition Screening Tool Report, pt has been eating poorly because of a decreased appetite. Pt has also recently lost weight without trying. No % PO intake records available. Would benefit from oral nutrition supplements. Unable to complete Nutrition Focused Physical Exam at this time, however, suspect malnutrition.  Diet Order:  Diet Heart Room service appropriate? Yes; Fluid consistency: Thin  Skin:  Reviewed, no issues  Last BM:  7/20  Height:   Ht Readings from Last 1 Encounters:  10/30/15 5\' 6"  (1.676 m)    Weight:   Wt Readings from Last 1 Encounters:  10/31/15 128 lb 12 oz (58.4 kg)    Ideal Body Weight:  59 kg  BMI:  Body mass index is 20.78 kg/m.  Estimated Nutritional Needs:   Kcal:  1500-1700  Protein:  85-95 gm  Fluid:  1.5-1.7 L  EDUCATION NEEDS:   No education needs identified at this time  Arthur Holms, RD, LDN Pager #: [phone] After-Hours  Pager #: [phone]

## 2015-10-31 NOTE — Plan of Care (Signed)
Cardiology  Patient is a transfer from [facility] and was accepted by the day team.   Patient seen again. Reports breathing some what better. No pain. Has received Atrovent Neb. NS boluses. SBP did drop earlier down in 70's but improved with dropping Dilt infusion down from 15 mg/hr to 10 mg/hr. HR now well controlled in 90's after Lopressor 5 mg IVx1. Blood cx sent. Antibiotics in. Lactate sent. Will give additional NS 250 ml bolus. Decrease Dilt infusion further down to 5 mg/hr.   Wandra Mannan, MD

## 2015-10-31 NOTE — Progress Notes (Signed)
Dr. Susy Manor paged and made aware of HR sustaining 130-170 for several minutes. Order received for 5mg  lopressor stat. If no change in HR call again in 15 minutes. Will continue to monitor closely.  Eleonore Chiquito RN 2 [place]

## 2015-10-31 NOTE — Consult Note (Signed)
Name: Patricia Jackson MRN: [id number] DOB: 09/30/1940    ADMISSION DATE:  10/30/2015 CONSULTATION DATE:  10/31/2015   REFERRING MD :  Debara Pickett, MD  CHIEF COMPLAINT:  Normal chest x-ray, respiratory distress  BRIEF PATIENT DESCRIPTION: 75 year old smoker with COPD admitted via [facility] with atrial fibrillation/RVR, found to have periodic or pneumonia  SIGNIFICANT EVENTS  7/25 ED visit 7/27 admission  STUDIES:  Echo 7/28 >>   HISTORY OF PRESENT ILLNESS:  75 year old heavy smoker who has moved from [place] to [place] about 3 years ago. She established with Dr. Nevada Crane was told that she has COPD and placed on albuterol nebs and what seems like albuterol MDI. For about a week she reports cough with clear sputum production, no fevers or chills or wheezing and increased dyspnea. She had an ER visit on 7/25, chest x-ray was noted normal was given duo nebs and discharged. Her dyspnea persisted and she came back to the ER on 7/27-she was noted to be in SVT with ventricular rate in the 180s given adenosine-underlying rhythm seemed to be atrial flutter, started on Cardizem drip and transferred to [facility] under cardiology service. Labs showed leukocytosis and chest x-ray showed increased interstitial prominence to my review   PAST MEDICAL HISTORY :   has a past medical history of Breast cancer ([place]); COPD (chronic obstructive pulmonary disease) ([place]); and Hypertension.  has a past surgical history that includes Appendectomy and Breast surgery. Prior to Admission medications   Medication Sig Start Date End Date Taking? Authorizing Provider  albuterol (PROVENTIL) (2.5 MG/3ML) 0.083% nebulizer solution Take 3 mLs (2.5 mg total) by nebulization every 6 (six) hours as needed for wheezing or shortness of breath. 07/07/14   Daleen Bo, MD  albuterol (PROVENTIL) (5 MG/ML) 0.5% nebulizer solution Take 0.5 mLs (2.5 mg total) by nebulization every 6 (six) hours as needed for wheezing or shortness of  breath. 07/07/14   Daleen Bo, MD  amLODipine (NORVASC) 10 MG tablet Take 10 mg by mouth daily. 06/26/13   Historical Provider, MD  aspirin EC 325 MG EC tablet Take 1 tablet (325 mg total) by mouth daily. 11/26/13   Radene Gunning, NP  doxycycline (VIBRAMYCIN) 100 MG capsule Take 1 capsule (100 mg total) by mouth 2 (two) times daily. One po bid x 7 days 07/07/14   Daleen Bo, MD  hydrOXYzine (VISTARIL) 25 MG capsule Take 25 mg by mouth 3 (three) times daily as needed. 06/19/14   Historical Provider, MD  losartan (COZAAR) 100 MG tablet Take 100 mg by mouth daily. 07/24/13   Historical Provider, MD  predniSONE (DELTASONE) 20 MG tablet Take 1 tablet (20 mg total) by mouth 2 (two) times daily. 07/07/14   Daleen Bo, MD  simvastatin (ZOCOR) 20 MG tablet Take 20 mg by mouth daily.    Historical Provider, MD   Allergies  Allergen Reactions  . Sulfa Antibiotics Hives    FAMILY HISTORY:  family history includes COPD in her father; Heart disease in her mother. SOCIAL HISTORY:  reports that she has been smoking Cigarettes.  She has a 50.00 pack-year smoking history. She has never used smokeless tobacco. She reports that she does not drink alcohol or use drugs.  REVIEW OF SYSTEMS:   Constitutional: Negative for fever, chills, malaise/fatigue and diaphoresis. pos weight loss  HENT: Negative for hearing loss, ear pain, nosebleeds, congestion, sore throat, neck pain, tinnitus and ear discharge.   Eyes: Negative for blurred vision, double vision, photophobia, pain, discharge and redness.  Respiratory: Negative for, hemoptysis,  and stridor.  positive for sputum production, shortness of breath, wheezing Cardiovascular: Negative for chest pain, palpitations, orthopnea, claudication, leg swelling and PND.  Gastrointestinal: Negative for heartburn, nausea, vomiting, abdominal pain, diarrhea, constipation, blood in stool and melena.  Genitourinary: Negative for dysuria, urgency, frequency, hematuria and flank  pain.  Musculoskeletal: Negative for myalgias, back pain, joint pain and falls.  Skin: Negative for itching and rash.  Neurological: Negative for dizziness, tingling, tremors, sensory change, speech change, focal weakness, seizures, loss of consciousness, weakness and headaches.  Endo/Heme/Allergies: Negative for environmental allergies and polydipsia. Does not bruise/bleed easily.  SUBJECTIVE:   VITAL SIGNS: Temp:  [97.3 F (36.3 C)-99.5 F (37.5 C)] 97.3 F (36.3 C) (07/28 0409) Pulse Rate:  [49-180] 100 (07/28 0715) Resp:  [20-41] 24 (07/28 0715) BP: (70-130)/(40-98) 94/59 (07/28 0700) SpO2:  [88 %-100 %] 90 % (07/28 0715) Weight:  [52.2 kg (115 lb)-58.4 kg (128 lb 12 oz)] 58.4 kg (128 lb 12 oz) (07/28 0500)  PHYSICAL EXAMINATION: Gen. Pleasant, thin, woman, in mild distress, normal affect ENT - no lesions, no post nasal drip Neck: No JVD, no thyromegaly, no carotid bruits Lungs: no use of accessory muscles, no dullness to percussion, decreased , few rales bibasal,no rhonchi  Cardiovascular: Rhythm regular, heart sounds  normal, no murmurs, no peripheral edema Abdomen: soft and non-tender, no hepatosplenomegaly, BS normal. Musculoskeletal: No deformities, no cyanosis or clubbing Neuro:  alert, non focal  Skin:  Warm, no lesions/ rash    Recent Labs Lab 10/28/15 1821 10/30/15 1740 10/30/15 1759 10/31/15 0438  NA 130* 129* 134* 135  K 4.3 3.9 3.9 3.7  CL 105 102 102 109  CO2 19* 17*  --  16*  BUN 24* 26* 25* 20  CREATININE 1.41* 1.42* 1.30* 1.11*  GLUCOSE 93 164* 158* 139*    Recent Labs Lab 10/28/15 1821 10/30/15 1740 10/30/15 1759 10/31/15 0438  HGB 13.8 13.9 15.3* 11.0*  HCT 41.0 41.4 45.0 32.9*  WBC 16.5* 17.6*  --  12.6*  PLT 166 181  --  158   Dg Chest Port 1 View  Result Date: 10/31/2015 CLINICAL DATA:  Respiratory failure, shortness of breath, history of COPD, breast malignancy, atrial flutter, current smoker. EXAM: PORTABLE CHEST 1 VIEW  COMPARISON:  Portable chest x-ray of October 30, 2015, October 28, 2015 and November 26, 2013. FINDINGS: The lungs remain hyperinflated. The interstitial markings remain increased diffusely. There is no alveolar infiltrate. No pleural effusion is observed but the costophrenic angle on the left is not excluded from the field of view. External pacemaker defibrillator pads are present. The cardiac silhouette is normal in size. The pulmonary vascularity is not engorged. There are calcifications in the wall of the aortic arch. There surgical clips in the high left axillary region. IMPRESSION: COPD with pulmonary fibrotic changes. These interstitial densities are more conspicuous than on the study of July 25th which may reflect of element of acute interstitial edema or pneumonia. There has been significant overall increase in the prominence of the pulmonary interstitium since the November 26, 2013 study. Electronically Signed   By: David  Martinique M.D.   On: 10/31/2015 07:27  Dg Chest Portable 1 View  Result Date: 10/30/2015 CLINICAL DATA:  Chest pain with shortness of breath and tachycardia. EXAM: PORTABLE CHEST 1 VIEW COMPARISON:  10/28/2015 FINDINGS: Cardiac silhouette is normal in size. No mediastinal or hilar masses or convincing adenopathy. Lungs are hyperexpanded. Thickened interstitial markings are stable from prior exam. No  evidence of pneumonia or pulmonary edema. No pleural effusion or pneumothorax. The bony thorax is demineralized but grossly intact. IMPRESSION: 1. No acute cardiopulmonary disease. 2. Lung hyperexpansion and chronic interstitial thickening without change from the prior study. Electronically Signed   By: Lajean Manes M.D.   On: 10/30/2015 17:55   ASSESSMENT / PLAN:  Community-acquired pneumonia with underlying COPD - Continue IV ceftriaxone and azithromycin Use Atrovent nebs standing every 6, and albuterol every 3 hours when necessary-since tachycardia On discharge can change to long-acting  bronchodilator IV Solu-Medrol 40 every 12 Check urine strep antigen and Legionella antigen for completion Smoking cessation emphasized  Atrial fibrillation RVR -management per cardiology Cardizem drip for rate control Eliquis added for anticoagulation Consider cardioversion next week once respiratory issues improved  Discussed with cardiology-PCC M will assume care while in the ICU, she will need pulmonary follow-up on discharge  Kara Mead MD. FCCP. [facility] Pager [phone] If no response call [phone]   10/31/2015     10/31/2015, 8:38 AM

## 2015-10-31 NOTE — Progress Notes (Signed)
ANTICOAGULATION CONSULT NOTE - Follow Up Consult  Pharmacy Consult for heparin Indication: atrial fibrillation  Allergies  Allergen Reactions  . Sulfa Antibiotics Hives    Patient Measurements: Height: 5\' 6"  (167.6 cm) Weight: 128 lb 12 oz (58.4 kg) IBW/kg (Calculated) : 59.3 Heparin Dosing Weight: 52kg  Vital Signs: Temp: 97.7 F (36.5 C) (07/28 1108) Temp Source: Oral (07/28 1108) BP: 109/69 (07/28 1400) Pulse Rate: 102 (07/28 1400)  Labs:  Recent Labs  10/28/15 1821 10/30/15 1740 10/30/15 1759 10/30/15 2319 10/31/15 0438 10/31/15 0941  HGB 13.8 13.9 15.3*  --  11.0*  --   HCT 41.0 41.4 45.0  --  32.9*  --   PLT 166 181  --   --  158  --   CREATININE 1.41* 1.42* 1.30*  --  1.11*  --   TROPONINI  --   --   --  0.04* 0.09* 0.09*    Estimated Creatinine Clearance: 42.2 mL/min (by C-G formula based on SCr of 1.11 mg/dL).   Assessment: 75 year old smoker with COPD [facility] with atrial fibrillation/RVR, found to have periodic or pneumonia. Patient now appears to be in afib/flutter, she was started on apixaban last night, received dose this am now being changed to IV heparin by critical care medicine.   Given recent direct xa inhibitor use, heparin levels are unlikely to be accurate. Will check baseline anti-xa level tonight but plan to dose based on aptt until levels correlate  Goal of Therapy:  Heparin level 0.3-0.7 units/ml aPTT 66-102 seconds Monitor platelets by anticoagulation protocol: Yes   Plan:  Last dose of apixaban was 10am this morning Will start IV heparin tonight without bolus>>700 units/hr Heparin level tonight then daily until they correlate with aptt  Erin Hearing PharmD., BCPS Clinical Pharmacist Pager [phone] 10/31/2015 3:23 PM

## 2015-10-31 NOTE — Progress Notes (Signed)
  Echocardiogram 2D Echocardiogram has been performed.  Tresa Res 10/31/2015, 12:10 PM

## 2015-10-31 NOTE — Progress Notes (Signed)
BP 113/69 [id number] AFib since patient ate breakfast. No complalints from patient. Cardizem drip titrated up to 10mg  per previous  orders. Dr Debara Pickett notified and ordered 10mg  IV bolus of cardizem and change status to ICU patient from stepdown.Marland Kitchen

## 2015-10-31 NOTE — Progress Notes (Signed)
Name: Patricia Jackson MRN: [id number] DOB: Jun 26, 1940    ADMISSION DATE:  10/30/2015 CONSULTATION DATE:  10/31/2015   REFERRING MD :  Debara Pickett, MD  CHIEF COMPLAINT:  Normal chest x-ray, respiratory distress  BRIEF PATIENT DESCRIPTION: 75 year old smoker with COPD admitted via [facility] ED with atrial fibrillation/RVR, found to have periodic or pneumonia  SIGNIFICANT EVENTS  7/25 - ED visit 7/27 - admission  STUDIES:  PORT CXR 7/28: Increased bilateral interstitial markings. No obvious pleural effusion. TTE 7/28>>>  MICROBIOLOGY: MRSA PCR 7/27:  Negative Blood Ctx x2 7/27>>> Urine Strep Ag 7/28>>> Urine Legionella Ag 7/28>>>  ANTIBIOTICS: Rocephin 7/27>>> Azithromycin 7/27>>>  LINES/TUBES: PIV x2  HISTORY OF PRESENT ILLNESS:  75 year old heavy smoker who has moved from [place] to [place] about 3 years ago. She established with Dr. Nevada Crane was told that she has COPD and placed on albuterol nebs and what seems like albuterol MDI. For about a week she reports cough with clear sputum production, no fevers or chills or wheezing and increased dyspnea. She had an ER visit on 7/25, chest x-ray was noted normal was given duo nebs and discharged. Her dyspnea persisted and she came back to the ER on 7/27-she was noted to be in SVT with ventricular rate in the 180s given adenosine-underlying rhythm seemed to be atrial flutter, started on Cardizem drip and transferred to [facility] under cardiology service. Labs showed leukocytosis and chest x-ray showed increased interstitial prominence to my review  SUBJECTIVE: Patient continuing to report intermittent dyspnea. Denies any chest pain or pressure but is having intermittent palpitations.  REVIEW OF SYSTEMS:   Denies any nausea or vomiting. Denies any subjective fever, chills, or sweats.  VITAL SIGNS: Temp:  [97.3 F (36.3 C)-99.5 F (37.5 C)] 97.7 F (36.5 C) (07/28 1108) Pulse Rate:  [49-180] 102 (07/28 1400) Resp:  [20-41] 32 (07/28  1400) BP: (70-130)/(40-98) 109/69 (07/28 1400) SpO2:  [87 %-100 %] 87 % (07/28 1400) Weight:  [115 lb (52.2 kg)-128 lb 12 oz (58.4 kg)] 128 lb 12 oz (58.4 kg) (07/28 0500)  PHYSICAL EXAMINATION: Gen. Pleasant, thin, woman, in mild distress, normal affect ENT - no lesions, no post nasal drip Neck: No JVD, no thyromegaly, no carotid bruits Lungs: no use of accessory muscles, no dullness to percussion, decreased , few rales bibasal,no rhonchi  Cardiovascular: Rhythm regular, heart sounds  normal, no murmurs, no peripheral edema Abdomen: soft and non-tender, no hepatosplenomegaly, BS normal. Musculoskeletal: No deformities, no cyanosis or clubbing Neuro:  alert, non focal  Skin:  Warm, no lesions/ rash    Recent Labs Lab 10/28/15 1821 10/30/15 1740 10/30/15 1759 10/31/15 0438  NA 130* 129* 134* 135  K 4.3 3.9 3.9 3.7  CL 105 102 102 109  CO2 19* 17*  --  16*  BUN 24* 26* 25* 20  CREATININE 1.41* 1.42* 1.30* 1.11*  GLUCOSE 93 164* 158* 139*    Recent Labs Lab 10/28/15 1821 10/30/15 1740 10/30/15 1759 10/31/15 0438  HGB 13.8 13.9 15.3* 11.0*  HCT 41.0 41.4 45.0 32.9*  WBC 16.5* 17.6*  --  12.6*  PLT 166 181  --  158   Dg Chest Port 1 View  Result Date: 10/31/2015 CLINICAL DATA:  Respiratory failure, shortness of breath, history of COPD, breast malignancy, atrial flutter, current smoker. EXAM: PORTABLE CHEST 1 VIEW COMPARISON:  Portable chest x-ray of October 30, 2015, October 28, 2015 and November 26, 2013. FINDINGS: The lungs remain hyperinflated. The interstitial markings remain increased diffusely. There is no  alveolar infiltrate. No pleural effusion is observed but the costophrenic angle on the left is not excluded from the field of view. External pacemaker defibrillator pads are present. The cardiac silhouette is normal in size. The pulmonary vascularity is not engorged. There are calcifications in the wall of the aortic arch. There surgical clips in the high left axillary  region. IMPRESSION: COPD with pulmonary fibrotic changes. These interstitial densities are more conspicuous than on the study of July 25th which may reflect of element of acute interstitial edema or pneumonia. There has been significant overall increase in the prominence of the pulmonary interstitium since the November 26, 2013 study. Electronically Signed   By: David  Martinique M.D.   On: 10/31/2015 07:27  Dg Chest Portable 1 View  Result Date: 10/30/2015 CLINICAL DATA:  Chest pain with shortness of breath and tachycardia. EXAM: PORTABLE CHEST 1 VIEW COMPARISON:  10/28/2015 FINDINGS: Cardiac silhouette is normal in size. No mediastinal or hilar masses or convincing adenopathy. Lungs are hyperexpanded. Thickened interstitial markings are stable from prior exam. No evidence of pneumonia or pulmonary edema. No pleural effusion or pneumothorax. The bony thorax is demineralized but grossly intact. IMPRESSION: 1. No acute cardiopulmonary disease. 2. Lung hyperexpansion and chronic interstitial thickening without change from the prior study. Electronically Signed   By: Lajean Manes M.D.   On: 10/30/2015 17:55   ASSESSMENT / PLAN:  75 year old female admitted with severe community acquired pneumonia and atrial fibrillation with rapid ventricular response. Patient's respiratory status remains tenuous. Currently cardiology is adjusting diltiazem infusion for rate control of her underlying atrial fibrillation. Appears to have acute hypoxic respiratory failure and mild exacerbation of underlying COPD secondary to her pneumonia and atrial fibrillation with rapid ventricular response.  1. Acute hypoxic respiratory failure: Continuing to wean FiO2 for saturation 88-94%. 2. Atrial fibrillation with rapid ventricular response: Discontinuing eloquent. Initiating heparin infusion per pharmacy protocol. Currently on diltiazem infusion. Continuing telemetry monitoring. Rhythm and rate control per cardiology service. 3. Severe  community acquired pneumonia: Continuing empiric Rocephin & azithromycin. Awaiting urine strep and legionella antigens which have been changed to stat. 4. COPD with exacerbation: Continuing Atrovent nebs every 6 hours & Solu-Medrol IV every 12 hours. Holding home Symbicort. 5. H/O HTN: Vitals per unit protocol. Holding home Norvasc. 6. H/O Hyperlipidemia:  Holding home Zocor. 7. Prophylaxis: Systemic and coagulation for atrial fibrillation. 8. Diet: Continuing heart healthy diet for now. 9. Tobacco use disorder: Tobacco cessation addressed by Dr. Elsworth Soho earlier today. Needs to be readdressed prior to discharge.   Sonia Baller Ashok Cordia, M.D. [facility] Pager:  [phone] After 3pm or if no response, call [phone] 10/31/2015, 2:47 PM

## 2015-10-31 NOTE — Progress Notes (Addendum)
Cards fellow in house, Azeem, informed about hypotension. 70/57 (63). Will continue to monitor closely. Order for 250 NS bolus received.  Eleonore Chiquito RN 2 [place]

## 2015-10-31 NOTE — Care Management Important Message (Signed)
Important Message  Patient Details  Name: Patricia Jackson MRN: [id number] Date of Birth: 1940-10-01   Medicare Important Message Given:  Yes    Loann Quill 10/31/2015, 10:30 AM

## 2015-11-01 ENCOUNTER — Inpatient Hospital Stay (HOSPITAL_COMMUNITY): Payer: Medicare Other

## 2015-11-01 DIAGNOSIS — K5900 Constipation, unspecified: Secondary | ICD-10-CM

## 2015-11-01 DIAGNOSIS — J441 Chronic obstructive pulmonary disease with (acute) exacerbation: Secondary | ICD-10-CM

## 2015-11-01 DIAGNOSIS — J9601 Acute respiratory failure with hypoxia: Secondary | ICD-10-CM

## 2015-11-01 LAB — CBC
HCT: 30.9 % — ABNORMAL LOW (ref 36.0–46.0)
Hemoglobin: 11 g/dL — ABNORMAL LOW (ref 12.0–15.0)
MCH: 29.6 pg (ref 26.0–34.0)
MCHC: 35.6 g/dL (ref 30.0–36.0)
MCV: 83.1 fL (ref 78.0–100.0)
Platelets: 197 10*3/uL (ref 150–400)
RBC: 3.72 MIL/uL — ABNORMAL LOW (ref 3.87–5.11)
RDW: 16.2 % — ABNORMAL HIGH (ref 11.5–15.5)
WBC: 11.6 10*3/uL — ABNORMAL HIGH (ref 4.0–10.5)

## 2015-11-01 LAB — ECHOCARDIOGRAM COMPLETE
Height: 66 in
Weight: 2059.98 oz

## 2015-11-01 LAB — APTT: aPTT: 36 seconds (ref 24–36)

## 2015-11-01 LAB — TSH: TSH: 0.83 u[IU]/mL (ref 0.350–4.500)

## 2015-11-01 LAB — BASIC METABOLIC PANEL
Anion gap: 16 — ABNORMAL HIGH (ref 5–15)
BUN: 23 mg/dL — ABNORMAL HIGH (ref 6–20)
CO2: 10 mmol/L — ABNORMAL LOW (ref 22–32)
Calcium: 8 mg/dL — ABNORMAL LOW (ref 8.9–10.3)
Chloride: 103 mmol/L (ref 101–111)
Creatinine, Ser: 0.92 mg/dL (ref 0.44–1.00)
GFR calc Af Amer: >60 mL/min (ref >60)
GFR calc non Af Amer: >60 mL/min (ref >60)
Glucose, Bld: 171 mg/dL — ABNORMAL HIGH (ref 65–99)
Potassium: 3.8 mmol/L (ref 3.5–5.1)
Sodium: 129 mmol/L — ABNORMAL LOW (ref 135–145)

## 2015-11-01 LAB — HEPARIN LEVEL (UNFRACTIONATED): Heparin Unfractionated: >2.2 IU/mL — ABNORMAL HIGH (ref 0.30–0.70)

## 2015-11-01 LAB — HEMOGLOBIN A1C
Hgb A1c MFr Bld: 5.6 % (ref 4.8–5.6)
Mean Plasma Glucose: 114 mg/dL

## 2015-11-01 MED ORDER — APIXABAN 5 MG PO TABS
5.0000 mg | ORAL_TABLET | Freq: Two times a day (BID) | ORAL | Status: DC
  Administered 2015-11-01 – 2015-11-08 (×15): 5 mg via ORAL
  Filled 2015-11-01 (×16): qty 1

## 2015-11-01 MED ORDER — SENNOSIDES-DOCUSATE SODIUM 8.6-50 MG PO TABS
1.0000 | ORAL_TABLET | Freq: Two times a day (BID) | ORAL | Status: DC
  Administered 2015-11-01 – 2015-11-08 (×15): 1 via ORAL
  Filled 2015-11-01 (×16): qty 1

## 2015-11-01 MED ORDER — AMIODARONE LOAD VIA INFUSION
150.0000 mg | Freq: Once | INTRAVENOUS | Status: AC
  Administered 2015-11-01: 150 mg via INTRAVENOUS
  Filled 2015-11-01: qty 83.34

## 2015-11-01 MED ORDER — AMIODARONE HCL IN DEXTROSE 360-4.14 MG/200ML-% IV SOLN
30.0000 mg/h | INTRAVENOUS | Status: DC
  Administered 2015-11-01: 30 mg/h via INTRAVENOUS
  Filled 2015-11-01: qty 200

## 2015-11-01 MED ORDER — DILTIAZEM HCL 100 MG IV SOLR: 5.0000 mg/h | INTRAVENOUS | Status: DC

## 2015-11-01 MED ORDER — AMIODARONE HCL IN DEXTROSE 360-4.14 MG/200ML-% IV SOLN
60.0000 mg/h | INTRAVENOUS | Status: DC
  Administered 2015-11-01: 60 mg/h via INTRAVENOUS
  Filled 2015-11-01: qty 200

## 2015-11-01 MED ORDER — CETYLPYRIDINIUM CHLORIDE 0.05 % MT LIQD
7.0000 mL | Freq: Two times a day (BID) | OROMUCOSAL | Status: DC
  Administered 2015-11-04 – 2015-11-08 (×9): 7 mL via OROMUCOSAL

## 2015-11-01 MED ORDER — CHLORHEXIDINE GLUCONATE 0.12 % MT SOLN
15.0000 mL | Freq: Two times a day (BID) | OROMUCOSAL | Status: DC
  Administered 2015-11-01 – 2015-11-03 (×3): 15 mL via OROMUCOSAL
  Filled 2015-11-01 (×2): qty 15

## 2015-11-01 MED ORDER — ATORVASTATIN CALCIUM 20 MG PO TABS
20.0000 mg | ORAL_TABLET | Freq: Every day | ORAL | Status: DC
  Administered 2015-11-02 – 2015-11-08 (×7): 20 mg via ORAL
  Filled 2015-11-01 (×8): qty 1

## 2015-11-01 NOTE — Progress Notes (Signed)
ANTICOAGULATION CONSULT NOTE - Follow Up Consult  Pharmacy Consult for Eliquis Indication: atrial fibrillation  Patient Measurements: Height: 5' 6"  (167.6 cm) Weight: 130 lb 4.7 oz (59.1 kg) IBW/kg (Calculated) : 59.3  Vital Signs: Temp: 97.5 F (36.4 C) (07/29 1300) Temp Source: Oral (07/29 1300) BP: 116/63 (07/29 1000) Pulse Rate: 147 (07/29 1000)  Labs:  Recent Labs  10/30/15 1740 10/30/15 1759 10/30/15 2319 10/31/15 0438 10/31/15 0941 10/31/15 2033 11/01/15 0619  HGB 13.9 15.3*  --  11.0*  --   --  11.0*  HCT 41.4 45.0  --  32.9*  --   --  30.9*  PLT 181  --   --  158  --   --  197  APTT  --   --   --   --   --   --  36  HEPARINUNFRC  --   --   --   --   --  >2.20* >2.20*  CREATININE 1.42* 1.30*  --  1.11*  --   --  0.92  TROPONINI  --   --  0.04* 0.09* 0.09*  --   --     Estimated Creatinine Clearance: 51.6 mL/min (by C-G formula based on SCr of 0.92 mg/dL).   Assessment: Patricia Jackson is a 89 yoF who was admitted for dyspnea and found to be in AFib with RVR. She was initiated on Eliquis, but transitioned to heparin for concerns of possible intubation. Today, she is being transitioned back to Eliquis in anticipation of cardioversion. Her next aPTT assessment was scheduled for 1400, which was cancelled due to the switch to Eliquis. Hgb stable at 11, platelets wnl. No bleeding noted. Scr has decreased to 0.92.   Goal of Therapy:  Monitor platelets by anticoagulation protocol: Yes   Plan:  - Discontinue heparin - Start Eliquis 20m po BID - Continue to monitor s/sx's of bleeding and CBC as needed  Pharmacy will sign off for now. Please consult with additional Eliquis dosing concerns. Thank you for allowing [facility] be involved in the care of this patient.  ABelia Heman PharmD PGY1 Pharmacy Resident [phone](Pager) 11/01/2015 1:54 PM

## 2015-11-01 NOTE — Discharge Instructions (Signed)
Information on my medicine - ELIQUIS® (apixaban) ° °This medication education was reviewed with me or my healthcare representative as part of my discharge preparation.  The pharmacist that spoke with me during my hospital stay was:  Gwenyth Dingee Rhea, RPH ° °Why was Eliquis® prescribed for you? °Eliquis® was prescribed for you to reduce the risk of a blood clot forming that can cause a stroke if you have a medical condition called atrial fibrillation (a type of irregular heartbeat). ° °What do You need to know about Eliquis® ? °Take your Eliquis® TWICE DAILY - one tablet in the morning and one tablet in the evening with or without food. If you have difficulty swallowing the tablet whole please discuss with your pharmacist how to take the medication safely. ° °Take Eliquis® exactly as prescribed by your doctor and DO NOT stop taking Eliquis® without talking to the doctor who prescribed the medication.  Stopping may increase your risk of developing a stroke.  Refill your prescription before you run out. ° °After discharge, you should have regular check-up appointments with your healthcare provider that is prescribing your Eliquis®.  In the future your dose may need to be changed if your kidney function or weight changes by a significant amount or as you get older. ° °What do you do if you miss a dose? °If you miss a dose, take it as soon as you remember on the same day and resume taking twice daily.  Do not take more than one dose of ELIQUIS at the same time to make up a missed dose. ° °Important Safety Information °A possible side effect of Eliquis® is bleeding. You should call your healthcare provider right away if you experience any of the following: °  Bleeding from an injury or your nose that does not stop. °  Unusual colored urine (red or dark brown) or unusual colored stools (red or black). °  Unusual bruising for unknown reasons. °  A serious fall or if you hit your head (even if there is no  bleeding). ° °Some medicines may interact with Eliquis® and might increase your risk of bleeding or clotting while on Eliquis®. To help avoid this, consult your healthcare provider or pharmacist prior to using any new prescription or non-prescription medications, including herbals, vitamins, non-steroidal anti-inflammatory drugs (NSAIDs) and supplements. ° °This website has more information on Eliquis® (apixaban): http://www.eliquis.com/eliquis/home ° °

## 2015-11-01 NOTE — Progress Notes (Signed)
Amiodarone Drug - Drug Interaction Consult Note  Recommendations: Patient is on the following interacting meds: atorvastatin (formulary substitution for simvastatin PTA), diltiazem IV, and azithromycin. - Monitor for myalgia, bradycardia/AV block, and QTc prolongation due to these drug-drug interactions. - Continue atorvastatin on discharge due to less interaction than simvastatin.   Amiodarone is metabolized by the cytochrome P450 system and therefore has the potential to cause many drug interactions. Amiodarone has an average plasma half-life of 50 days (range 20 to 100 days).   There is potential for drug interactions to occur several weeks or months after stopping treatment and the onset of drug interactions may be slow after initiating amiodarone.   [x]  Statins: Increased risk of myopathy. Simvastatin- restrict dose to 20mg  daily. Other statins: counsel patients to report any muscle pain or weakness immediately.   [x]   Calcium channel blockers (diltiazem and verapamil): increased risk of bradycardia, AV block and myocardial depression.   [x]  Drugs that prolong the QT interval:  Torsades de pointes risk may be increased with concurrent use - avoid if possible.  Monitor QTc, also keep magnesium/potassium WNL if concurrent therapy can't be avoided.   Marland Kitchen Antibiotics: azithromycin  Thank Leilani Merl, PharmD PGY1 Pharmacy Resident [phone] (Pager) 11/01/2015 11:41 AM

## 2015-11-01 NOTE — Progress Notes (Signed)
[facility] Progress Note Patient Name: Patricia Jackson DOB: [birth year] MRN: [id number]   Date of Service  11/01/2015  HPI/Events of Note  Copd patient with pneumonia and afib noted to have dyspnea and hypoxia.  eICU Interventions  CXR BIPAP ALB NEB     Intervention Category Major Interventions: Hypoxemia - evaluation and management  Mauri Brooklyn, P 11/01/2015, 9:33 PM

## 2015-11-01 NOTE — Progress Notes (Signed)
Name: Patricia Jackson MRN: [id number] DOB: 11-23-1940    ADMISSION DATE:  10/30/2015 CONSULTATION DATE:  11/01/2015   REFERRING MD :  Debara Pickett, MD  CHIEF COMPLAINT:  Normal chest x-ray, respiratory distress  BRIEF PATIENT DESCRIPTION: 75 year old smoker with COPD admitted via [facility] with atrial fibrillation/RVR, found to have periodic or pneumonia  SIGNIFICANT EVENTS  7/25 - ED visit 7/27 - admission  STUDIES:  PFT 11/21/13: FVC 2.54 L (75%) FEV1 1.68 L (66%) FEV1/FVC 0.66 FEF 25-75 0.83 L (40%) no bronchodilator response TLC 5.65 L (102%) RV 138% DLCO uncorrected 40% PORT CXR 7/28: Increased bilateral interstitial markings. No obvious pleural effusion. TTE 7/28: LV without regional wall motion abnormalities. LA & RA normal in size. RV normal in size and function. Pulmonary artery systolic pressure 41 mmHg. No aortic stenosis or regurgitation. No mitral stenosis but mild to moderate regurgitation directed centrally. Trivial tricuspid regurgitation. Poorly visualized pulmonic valve. No pericardial effusion.  MICROBIOLOGY: MRSA PCR 7/27:  Negative Blood Ctx x2 7/27>>> Urine Strep Ag 7/28: Negative Urine Legionella Ag 7/28>>>  ANTIBIOTICS: Rocephin 7/27>>> Azithromycin 7/27>>>  LINES/TUBES: PIV x2  HISTORY OF PRESENT ILLNESS:  75 year old heavy smoker who has moved from [place] to [place] about 3 years ago. She established with Dr. Nevada Crane was told that she has COPD and placed on albuterol nebs and what seems like albuterol MDI. For about a week she reports cough with clear sputum production, no fevers or chills or wheezing and increased dyspnea. She had an ER visit on 7/25, chest x-ray was noted normal was given duo nebs and discharged. Her dyspnea persisted and she came back to the ER on 7/27-she was noted to be in SVT with ventricular rate in the 180s given adenosine-underlying rhythm seemed to be atrial flutter, started on Cardizem drip and transferred to [facility] under  cardiology service. Labs showed leukocytosis and chest x-ray showed increased interstitial prominence to my review  SUBJECTIVE: Dyspnea is unchanged. Reports intermittent nonproductive cough unchanged. Denies any chest pain, pressure, or tightness. Diltiazem infusion has been increased to 20 mg per hour IV and heart rate still greater than 110 bpm consistently.-  REVIEW OF SYSTEMS:   No abdominal pain, nausea, or diarrhea. Patient reports chronic constipation that is unchanged. No fever, chills, or sweats. Denies any headache or vision changes.  VITAL SIGNS: Temp:  [97.4 F (36.3 C)-97.8 F (36.6 C)] 97.4 F (36.3 C) (07/28 2315) Pulse Rate:  [60-131] 81 (07/29 0600) Resp:  [20-37] 29 (07/29 0600) BP: (92-135)/(64-95) 114/73 (07/29 0600) SpO2:  [87 %-98 %] 90 % (07/29 0710) Weight:  [130 lb 4.7 oz (59.1 kg)] 130 lb 4.7 oz (59.1 kg) (07/29 0500)  PHYSICAL EXAMINATION: General:  Awake. Alert. No distress. Sitting watching TV. No family present. Integument:  Warm & dry. No rash on exposed skin.  HEENT:  Moist mucus membranes. No oral ulcers. No scleral injection.  Cardiovascular:  Tachycardic with irregular rhythm. Atrial fibrillation on telemetry. No edema or JVD appreciated.  Pulmonary:  Good aeration bilaterally. No wheezing appreciated & only mild crackles on auscultation. Moderately increased work of breathing on nasal cannula oxygen. Abdomen: Soft. Hypoactive sounds. Nondistended. Grossly nontender. Neurological: Alert and oriented 4. Moving all 4 extremities equally. Cranial nerves grossly nonfocal.   Recent Labs Lab 10/28/15 1821 10/30/15 1740 10/30/15 1759 10/31/15 0438  NA 130* 129* 134* 135  K 4.3 3.9 3.9 3.7  CL 105 102 102 109  CO2 19* 17*  --  16*  BUN 24* 26*  25* 20  CREATININE 1.41* 1.42* 1.30* 1.11*  GLUCOSE 93 164* 158* 139*    Recent Labs Lab 10/30/15 1740 10/30/15 1759 10/31/15 0438 11/01/15 0619  HGB 13.9 15.3* 11.0* 11.0*  HCT 41.4 45.0 32.9* 30.9*   WBC 17.6*  --  12.6* 11.6*  PLT 181  --  158 197   Dg Chest Port 1 View  Result Date: 10/31/2015 CLINICAL DATA:  Respiratory failure, shortness of breath, history of COPD, breast malignancy, atrial flutter, current smoker. EXAM: PORTABLE CHEST 1 VIEW COMPARISON:  Portable chest x-ray of October 30, 2015, October 28, 2015 and November 26, 2013. FINDINGS: The lungs remain hyperinflated. The interstitial markings remain increased diffusely. There is no alveolar infiltrate. No pleural effusion is observed but the costophrenic angle on the left is not excluded from the field of view. External pacemaker defibrillator pads are present. The cardiac silhouette is normal in size. The pulmonary vascularity is not engorged. There are calcifications in the wall of the aortic arch. There surgical clips in the high left axillary region. IMPRESSION: COPD with pulmonary fibrotic changes. These interstitial densities are more conspicuous than on the study of July 25th which may reflect of element of acute interstitial edema or pneumonia. There has been significant overall increase in the prominence of the pulmonary interstitium since the November 26, 2013 study. Electronically Signed   By: David  Martinique M.D.   On: 10/31/2015 07:27  Dg Chest Portable 1 View  Result Date: 10/30/2015 CLINICAL DATA:  Chest pain with shortness of breath and tachycardia. EXAM: PORTABLE CHEST 1 VIEW COMPARISON:  10/28/2015 FINDINGS: Cardiac silhouette is normal in size. No mediastinal or hilar masses or convincing adenopathy. Lungs are hyperexpanded. Thickened interstitial markings are stable from prior exam. No evidence of pneumonia or pulmonary edema. No pleural effusion or pneumothorax. The bony thorax is demineralized but grossly intact. IMPRESSION: 1. No acute cardiopulmonary disease. 2. Lung hyperexpansion and chronic interstitial thickening without change from the prior study. Electronically Signed   By: Lajean Manes M.D.   On: 10/30/2015  17:55   ASSESSMENT / PLAN:  75 year old female admitted with severe community acquired pneumonia and atrial fibrillation with rapid ventricular response. Patient's respiratory status has remained stable compared with yesterday and my exam. She continues to have rapid ventricular response from her underlying atrial fibrillation despite continuous infusion of diltiazem. Awaiting a.m. electrolytes. Regards to her COPD exacerbation this too seems to be relatively stable with no wheezing and good aeration bilaterally. Continuing broad-spectrum empiric antibiotics for now while awaiting culture results. Starting Senokot-S for baseline constipation that is likely to be worsened by her diltiazem infusion. Respiratory status continues to remain tenuous.  1. Acute hypoxic respiratory failure: Continuing to wean FiO2 for saturation 88-94%. Stable. 2. Atrial fibrillation with rapid ventricular response: Heparin infusion per pharmacy protocol. Currently on diltiazem infusion. Continuing telemetry monitoring. Rhythm/rate control per cardiology service. 3. Severe community acquired pneumonia: Continuing empiric Rocephin & azithromycin Day #3. Awaiting urine legionella antigen and cultures. 4. Moderate COPD with exacerbation: Continuing Atrovent nebs every 6 hours & Solu-Medrol IV every 12 hours. Holding home Symbicort. 5. Constipation:  Starting Sennakot-S bid. 6. H/O HTN: Vitals per unit protocol. Holding home Norvasc. Currently normotensive. 7. H/O Hyperlipidemia:  Home Zocor 20mg  qhs. 8. Prophylaxis: Systemic anticoagulation for atrial fibrillation. 9. Diet: Continuing heart healthy diet for now. 10. Tobacco use disorder: Tobacco cessation addressed by Dr. Elsworth Soho earlier today. Needs to be readdressed prior to discharge.   Sonia Baller Ashok Cordia, M.D. [facility] Pager:  [phone] After 3pm or if no response, call [phone] 11/01/2015, 7:21 AM

## 2015-11-01 NOTE — Progress Notes (Signed)
ANTICOAGULATION CONSULT NOTE - Follow Up Consult  Pharmacy Consult for heparin Indication: atrial fibrillation  Labs:  Recent Labs  10/30/15 1740 10/30/15 1759 10/30/15 2319 10/31/15 0438 10/31/15 0941 10/31/15 2033 11/01/15 0619  HGB 13.9 15.3*  --  11.0*  --   --  11.0*  HCT 41.4 45.0  --  32.9*  --   --  30.9*  PLT 181  --   --  158  --   --  197  APTT  --   --   --   --   --   --  36  HEPARINUNFRC  --   --   --   --   --  >2.20*  --   CREATININE 1.42* 1.30*  --  1.11*  --   --   --   TROPONINI  --   --  0.04* 0.09* 0.09*  --   --     Assessment: 74yo female subtherapeutic on heparin with initial dosing for Afib; had rec'd doses of Eliquis prior to transition to heparin; RN reports no signs of bleeding overnight.  Goal of Therapy:  aPTT 66-102 seconds   Plan:  Will increase heparin gtt by 4 units/kg/hr to 900 units/hr and check PTT in 8hr.  Wynona Neat, PharmD, BCPS  11/01/2015,7:32 AM

## 2015-11-01 NOTE — Progress Notes (Signed)
Patient Name: Patricia Jackson Date of Encounter: 11/01/2015  Principal Problem:   Acute exacerbation of chronic bronchitis ([place]) Active Problems:   Dyspnea   Atrial flutter (HCC)   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HCC)   COPD (chronic obstructive pulmonary disease) (HCC)   Dyslipidemia   Elevated troponin   Length of Stay: 2  SUBJECTIVE  Still dyspneic at rest, speaking in interrupted sentences. O2 sat high 80s-low 90s. AFib  (flutter?) with poor rate control on max dose IV diltiazem.  CURRENT MEDS . aspirin  81 mg Oral Daily  . azithromycin  500 mg Intravenous Q24H  . cefTRIAXone (ROCEPHIN)  IV  1 g Intravenous Q2200  . feeding supplement (ENSURE ENLIVE)  237 mL Oral BID BM  . ipratropium  0.5 mg Nebulization QID  . methylPREDNISolone (SOLU-MEDROL) injection  40 mg Intravenous Q12H  . senna-docusate  1 tablet Oral BID  . simvastatin  20 mg Oral q1800   . diltiazem (CARDIZEM) infusion 20 mg/hr (11/01/15 0351)  . heparin 900 Units/hr (11/01/15 0732)     OBJECTIVE   Intake/Output Summary (Last 24 hours) at 11/01/15 1040 Last data filed at 11/01/15 0800  Gross per 24 hour  Intake              779 ml  Output             2000 ml  Net            -1221 ml   Filed Weights   10/30/15 2215 10/31/15 0500 11/01/15 0500  Weight: 58.3 kg (128 lb 8.5 oz) 58.4 kg (128 lb 12 oz) 59.1 kg (130 lb 4.7 oz)    PHYSICAL EXAM Vitals:   11/01/15 0700 11/01/15 0710 11/01/15 0800 11/01/15 0900  BP: 107/72  95/83   Pulse: 80  (!) 110   Resp: (!) 24  (!) 32   Temp:    97.6 F (36.4 C)  TempSrc:    Oral  SpO2: 90% 90% 91%   Weight:      Height:       General: Alert, oriented x3, mild distress, tachypnea Head: no evidence of trauma, PERRL, EOMI, no exophtalmos or lid lag, no myxedema, no xanthelasma; normal ears, nose and oropharynx Neck: normal jugular venous pulsations and no hepatojugular reflux; brisk carotid pulses without delay and no carotid bruits Chest: bilateral dry crackles, L>R,  no signs of consolidation by percussion or palpation, normal fremitus, symmetrical and full respiratory excursions. S/P L mastectomy Cardiovascular: normal position and quality of the apical impulse, irregular rhythm, normal first and second heart sounds, no rubs or gallops, no murmur Abdomen: no tenderness or distention, no masses by palpation, no abnormal pulsatility or arterial bruits, normal bowel sounds, no hepatosplenomegaly Extremities: no clubbing, cyanosis or edema; 2+ radial, ulnar and brachial pulses bilaterally; 2+ right femoral, posterior tibial and dorsalis pedis pulses; 2+ left femoral, posterior tibial and dorsalis pedis pulses; no subclavian or femoral bruits Neurological: grossly nonfocal  LABS  CBC  Recent Labs  10/30/15 1740  10/31/15 0438 11/01/15 0619  WBC 17.6*  --  12.6* 11.6*  NEUTROABS 15.5*  --   --   --   HGB 13.9  < > 11.0* 11.0*  HCT 41.4  < > 32.9* 30.9*  MCV 86.3  --  85.2 83.1  PLT 181  --  158 197  < > = values in this interval not displayed. Basic Metabolic Panel  Recent Labs  10/30/15 2319 10/31/15 0438 11/01/15 0619  NA  --  135 129*  K  --  3.7 3.8  CL  --  109 103  CO2  --  16* 10*  GLUCOSE  --  139* 171*  BUN  --  20 23*  CREATININE  --  1.11* 0.92  CALCIUM  --  7.3* 8.0*  MG 1.9  --   --    Liver Function Tests  Recent Labs  10/30/15 1740 10/30/15 2319  AST 45* 69*  ALT 22 44  ALKPHOS 77 191*  BILITOT 0.7 0.5  PROT 8.0 5.4*  ALBUMIN 3.7 2.4*   No results for input(s): LIPASE, AMYLASE in the last 72 hours. Cardiac Enzymes  Recent Labs  10/30/15 2319 10/31/15 0438 10/31/15 0941  TROPONINI 0.04* 0.09* 0.09*  Hemoglobin A1C  Recent Labs  10/30/15 2319  HGBA1C 5.6   Fasting Lipid Panel  Recent Labs  10/31/15 0438  CHOL 75  HDL 23*  LDLCALC 37  TRIG 74  CHOLHDL 3.3   Thyroid Function Tests  Recent Labs  10/30/15 2319  TSH 1.928    Radiology Studies Imaging results have been reviewed and Dg Chest  Port 1 View  Result Date: 10/31/2015 CLINICAL DATA:  Respiratory failure, shortness of breath, history of COPD, breast malignancy, atrial flutter, current smoker. EXAM: PORTABLE CHEST 1 VIEW COMPARISON:  Portable chest x-ray of October 30, 2015, October 28, 2015 and November 26, 2013. FINDINGS: The lungs remain hyperinflated. The interstitial markings remain increased diffusely. There is no alveolar infiltrate. No pleural effusion is observed but the costophrenic angle on the left is not excluded from the field of view. External pacemaker defibrillator pads are present. The cardiac silhouette is normal in size. The pulmonary vascularity is not engorged. There are calcifications in the wall of the aortic arch. There surgical clips in the high left axillary region. IMPRESSION: COPD with pulmonary fibrotic changes. These interstitial densities are more conspicuous than on the study of July 25th which may reflect of element of acute interstitial edema or pneumonia. There has been significant overall increase in the prominence of the pulmonary interstitium since the November 26, 2013 study. Electronically Signed   By: David  Martinique M.D.   On: 10/31/2015 07:27  Dg Chest Portable 1 View  Result Date: 10/30/2015 CLINICAL DATA:  Chest pain with shortness of breath and tachycardia. EXAM: PORTABLE CHEST 1 VIEW COMPARISON:  10/28/2015 FINDINGS: Cardiac silhouette is normal in size. No mediastinal or hilar masses or convincing adenopathy. Lungs are hyperexpanded. Thickened interstitial markings are stable from prior exam. No evidence of pneumonia or pulmonary edema. No pleural effusion or pneumothorax. The bony thorax is demineralized but grossly intact. IMPRESSION: 1. No acute cardiopulmonary disease. 2. Lung hyperexpansion and chronic interstitial thickening without change from the prior study. Electronically Signed   By: Lajean Manes M.D.   On: 10/30/2015 17:55   ECHO Normal LVEF, LA not really dilated, 1-2+MR  TELE AF  with RVR 110-140s   ASSESSMENT AND PLAN  1. Atrial flutter/atrial fibrillation - rate is not well controlled on IV cardizem 20 mg/hr. Start IV amiodarone. Resume Eliquis - will need 5 doses and pulmonary optimization prior to consideration of cardioversion as onset of flutter is unknown (she reports feeling unwell most of last week). 2. COPD/Pulmonary fibrosis - findings concerning for acute exacerbation of COPD/early pneumonia - increased interstitial prominence. Started on rocephin/azithromycin. 3. Elevated troponin - mild elevation, could be demand ischemia due to rate. Check 2D echo does not show regional wall motion abnormalities. 4. Dyslipidemia - switch to  atorvastatin due to amio interaction.   Sanda Klein, MD, [facility] [phone] office [phone] pager 11/01/2015 10:40 AM

## 2015-11-02 DIAGNOSIS — J9601 Acute respiratory failure with hypoxia: Secondary | ICD-10-CM

## 2015-11-02 LAB — RENAL FUNCTION PANEL
Albumin: 2.7 g/dL — ABNORMAL LOW (ref 3.5–5.0)
Anion gap: 12 (ref 5–15)
BUN: 33 mg/dL — ABNORMAL HIGH (ref 6–20)
CO2: 16 mmol/L — ABNORMAL LOW (ref 22–32)
Calcium: 8.7 mg/dL — ABNORMAL LOW (ref 8.9–10.3)
Chloride: 105 mmol/L (ref 101–111)
Creatinine, Ser: 1.03 mg/dL — ABNORMAL HIGH (ref 0.44–1.00)
GFR calc Af Amer: >60 mL/min (ref >60)
GFR calc non Af Amer: 53 mL/min — ABNORMAL LOW (ref >60)
Glucose, Bld: 309 mg/dL — ABNORMAL HIGH (ref 65–99)
Phosphorus: 1.5 mg/dL — ABNORMAL LOW (ref 2.5–4.6)
Potassium: 3.6 mmol/L (ref 3.5–5.1)
Sodium: 133 mmol/L — ABNORMAL LOW (ref 135–145)

## 2015-11-02 LAB — CBC
HCT: 32.8 % — ABNORMAL LOW (ref 36.0–46.0)
Hemoglobin: 11.1 g/dL — ABNORMAL LOW (ref 12.0–15.0)
MCH: 28.7 pg (ref 26.0–34.0)
MCHC: 33.8 g/dL (ref 30.0–36.0)
MCV: 84.8 fL (ref 78.0–100.0)
Platelets: 218 10*3/uL (ref 150–400)
RBC: 3.87 MIL/uL (ref 3.87–5.11)
RDW: 15.9 % — ABNORMAL HIGH (ref 11.5–15.5)
WBC: 10.9 10*3/uL — ABNORMAL HIGH (ref 4.0–10.5)

## 2015-11-02 LAB — MAGNESIUM: Magnesium: 2.4 mg/dL (ref 1.7–2.4)

## 2015-11-02 LAB — BRAIN NATRIURETIC PEPTIDE: B Natriuretic Peptide: 375.4 pg/mL — ABNORMAL HIGH (ref 0.0–100.0)

## 2015-11-02 MED ORDER — DILTIAZEM HCL ER COATED BEADS 180 MG PO CP24
360.0000 mg | ORAL_CAPSULE | Freq: Every day | ORAL | Status: DC
  Administered 2015-11-02 – 2015-11-08 (×7): 360 mg via ORAL
  Filled 2015-11-02 (×2): qty 2
  Filled 2015-11-02 (×2): qty 1
  Filled 2015-11-02: qty 2
  Filled 2015-11-02 (×2): qty 1

## 2015-11-02 MED ORDER — AMIODARONE HCL 200 MG PO TABS
200.0000 mg | ORAL_TABLET | Freq: Two times a day (BID) | ORAL | Status: DC
  Administered 2015-11-02 – 2015-11-04 (×6): 200 mg via ORAL
  Filled 2015-11-02 (×6): qty 1

## 2015-11-02 MED ORDER — IPRATROPIUM-ALBUTEROL 0.5-2.5 (3) MG/3ML IN SOLN
3.0000 mL | Freq: Four times a day (QID) | RESPIRATORY_TRACT | Status: DC
  Administered 2015-11-02 – 2015-11-05 (×10): 3 mL via RESPIRATORY_TRACT
  Filled 2015-11-02 (×11): qty 3

## 2015-11-02 MED ORDER — PREDNISONE 10 MG PO TABS
60.0000 mg | ORAL_TABLET | Freq: Every day | ORAL | Status: DC
  Administered 2015-11-03: 60 mg via ORAL
  Filled 2015-11-02: qty 6

## 2015-11-02 MED ORDER — FUROSEMIDE 10 MG/ML IJ SOLN
20.0000 mg | Freq: Once | INTRAMUSCULAR | Status: AC
  Administered 2015-11-02: 20 mg via INTRAVENOUS
  Filled 2015-11-02: qty 2

## 2015-11-02 MED ORDER — POTASSIUM CHLORIDE 10 MEQ/100ML IV SOLN
10.0000 meq | INTRAVENOUS | Status: AC
  Administered 2015-11-02 (×4): 10 meq via INTRAVENOUS
  Filled 2015-11-02: qty 100

## 2015-11-02 MED ORDER — POTASSIUM PHOSPHATES 15 MMOLE/5ML IV SOLN
30.0000 mmol | Freq: Once | INTRAVENOUS | Status: AC
  Administered 2015-11-02: 30 mmol via INTRAVENOUS
  Filled 2015-11-02: qty 10

## 2015-11-02 NOTE — Progress Notes (Signed)
Patient Name: Patricia Jackson Date of Encounter: 11/02/2015  Principal Problem:   Acute exacerbation of chronic bronchitis ([place]) Active Problems:   Dyspnea   Atrial flutter with rapid ventricular response (HCC)   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HCC)   COPD (chronic obstructive pulmonary disease) (HCC)   Dyslipidemia   Elevated troponin   Length of Stay: 3  SUBJECTIVE  Difficulty with breathing overnight, briefly required BiPAP, better today. Still wheezing. Less tachypneic Converted to NSR on IV amiodarone.  CURRENT MEDS . antiseptic oral rinse  7 mL Mouth Rinse q12n4p  . apixaban  5 mg Oral BID  . aspirin  81 mg Oral Daily  . atorvastatin  20 mg Oral q1800  . azithromycin  500 mg Intravenous Q24H  . cefTRIAXone (ROCEPHIN)  IV  1 g Intravenous Q2200  . chlorhexidine  15 mL Mouth Rinse BID  . feeding supplement (ENSURE ENLIVE)  237 mL Oral BID BM  . ipratropium  0.5 mg Nebulization QID  . methylPREDNISolone (SOLU-MEDROL) injection  40 mg Intravenous Q12H  . senna-docusate  1 tablet Oral BID    OBJECTIVE   Intake/Output Summary (Last 24 hours) at 11/02/15 0816 Last data filed at 11/02/15 0400  Gross per 24 hour  Intake           1293.1 ml  Output             1700 ml  Net           -406.9 ml   Filed Weights   10/31/15 0500 11/01/15 0500 11/02/15 0500  Weight: 58.4 kg (128 lb 12 oz) 59.1 kg (130 lb 4.7 oz) 60.2 kg (132 lb 11.5 oz)    PHYSICAL EXAM Vitals:   11/02/15 0500 11/02/15 0600 11/02/15 0700 11/02/15 0727  BP: 126/66 122/70    Pulse: 72 73 81   Resp: (!) 24 (!) 26 (!) 37   Temp:      TempSrc:      SpO2: 97% 98% 91% 93%  Weight: 60.2 kg (132 lb 11.5 oz)     Height:       General: Alert, oriented x3, no distress Head: no evidence of trauma, PERRL, EOMI, no exophtalmos or lid lag, no myxedema, no xanthelasma; normal ears, nose and oropharynx Neck: normal jugular venous pulsations and no hepatojugular reflux; brisk carotid pulses without delay and no carotid  bruits Chest: diffuse wheezing, but moving air fairly well, no signs of consolidation by percussion or palpation, normal fremitus, symmetrical and full respiratory excursions Cardiovascular: normal position and quality of the apical impulse, regular rhythm, normal first and second heart sounds, no rubs or gallops, no murmur Abdomen: no tenderness or distention, no masses by palpation, no abnormal pulsatility or arterial bruits, normal bowel sounds, no hepatosplenomegaly Extremities: no clubbing, cyanosis or edema; 2+ radial, ulnar and brachial pulses bilaterally; 2+ right femoral, posterior tibial and dorsalis pedis pulses; 2+ left femoral, posterior tibial and dorsalis pedis pulses; no subclavian or femoral bruits Neurological: grossly nonfocal  LABS  CBC  Recent Labs  10/30/15 1740  11/01/15 0619 11/02/15 0304  WBC 17.6*  < > 11.6* 10.9*  NEUTROABS 15.5*  --   --   --   HGB 13.9  < > 11.0* 11.1*  HCT 41.4  < > 30.9* 32.8*  MCV 86.3  < > 83.1 84.8  PLT 181  < > 197 218  < > = values in this interval not displayed. Basic Metabolic Panel  Recent Labs  10/30/15 2319  11/01/15 0619 11/02/15  0304  NA  --   < > 129* 133*  K  --   < > 3.8 3.6  CL  --   < > 103 105  CO2  --   < > 10* 16*  GLUCOSE  --   < > 171* 309*  BUN  --   < > 23* 33*  CREATININE  --   < > 0.92 1.03*  CALCIUM  --   < > 8.0* 8.7*  MG 1.9  --   --  2.4  PHOS  --   --   --  1.5*  < > = values in this interval not displayed. Liver Function Tests  Recent Labs  10/30/15 1740 10/30/15 2319 11/02/15 0304  AST 45* 69*  --   ALT 22 44  --   ALKPHOS 77 191*  --   BILITOT 0.7 0.5  --   PROT 8.0 5.4*  --   ALBUMIN 3.7 2.4* 2.7*   No results for input(s): LIPASE, AMYLASE in the last 72 hours. Cardiac Enzymes  Recent Labs  10/30/15 2319 10/31/15 0438 10/31/15 0941  TROPONINI 0.04* 0.09* 0.09*   BNP Invalid input(s): POCBNP D-Dimer No results for input(s): DDIMER in the last 72 hours. Hemoglobin  A1C  Recent Labs  10/30/15 2319  HGBA1C 5.6   Fasting Lipid Panel  Recent Labs  10/31/15 0438  CHOL 75  HDL 23*  LDLCALC 37  TRIG 74  CHOLHDL 3.3   Thyroid Function Tests  Recent Labs  11/01/15 1521  TSH 0.830    Radiology Studies Imaging results have been reviewed and Dg Chest Port 1 View  Result Date: 11/01/2015 CLINICAL DATA:  Shortness of breath.  History of COPD. EXAM: PORTABLE CHEST 1 VIEW COMPARISON:  10/31/2015 FINDINGS: The cardiomediastinal silhouette is within normal limits. Thoracic aortic atherosclerosis is again seen. The lungs remain hyperinflated. Diffuse interstitial densities are again seen throughout both lungs and have mildly increased from the prior study, most notably in the right upper lobe although bibasilar opacities also have mildly worsened in the interim. No sizable pleural effusion is identified, although it is difficult to exclude a small right pleural effusion. No pneumothorax is identified. 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 IMPRESSION: Increasing right greater than left lung opacities which may reflect edema or pneumonia. Electronically Signed   By: Logan Bores M.D.   On: 11/01/2015 21:56   TELE NSR  ASSESSMENT AND PLAN:  1. Atrial flutter/atrial fibrillation- On Eliquis. Switch to PO diltiazem and PO amiodarone. 2. COPD/Pulmonary fibrosis - Worsening lung infiltrates, especially on right. She is on rocephin/azithromycin. 3. Elevated troponin - mild elevation. Likely demand ischemia due to rate. Echo does not show regional wall motion abnormalities. 4. Dyslipidemia - simva- switched to atorvastatin due to amio interaction.    Sanda Klein, MD, [facility] [phone] office [phone] pager 11/02/2015 8:16 AM

## 2015-11-02 NOTE — Progress Notes (Signed)
Name: Patricia Jackson MRN: [id number] DOB: Dec 25, 1940    ADMISSION DATE:  10/30/2015 CONSULTATION DATE:  11/02/2015   REFERRING MD :  Debara Pickett, MD  CHIEF COMPLAINT:  Normal chest x-ray, respiratory distress  BRIEF PATIENT DESCRIPTION: 75 year old smoker with COPD admitted via [facility] with atrial fibrillation/RVR, found to have periodic or pneumonia  SIGNIFICANT EVENTS  7/25 - ED visit 7/27 - admission  STUDIES:  PFT 11/21/13: FVC 2.54 L (75%) FEV1 1.68 L (66%) FEV1/FVC 0.66 FEF 25-75 0.83 L (40%) no bronchodilator response TLC 5.65 L (102%) RV 138% DLCO uncorrected 40% PORT CXR 7/28: Increased bilateral interstitial markings. No obvious pleural effusion. TTE 7/28: LV without regional wall motion abnormalities. LA & RA normal in size. RV normal in size and function. Pulmonary artery systolic pressure 41 mmHg. No aortic stenosis or regurgitation. No mitral stenosis but mild to moderate regurgitation directed centrally. Trivial tricuspid regurgitation. Poorly visualized pulmonic valve. No pericardial effusion. Port CXR 7/29:  Worsening bilateral opacities R > L.  MICROBIOLOGY: MRSA PCR 7/27:  Negative Blood Ctx x2 7/27>>> Urine Strep Ag 7/28: Negative Urine Legionella Ag 7/28>>>  ANTIBIOTICS: Rocephin 7/27>>> Azithromycin 7/27>>>  LINES/TUBES: PIV x2  HISTORY OF PRESENT ILLNESS:  75 year old heavy smoker who has moved from [place] to [name] about 3 years ago. She established with Dr. Nevada Crane was told that she has COPD and placed on albuterol nebs and what seems like albuterol MDI. For about a week she reports cough with clear sputum production, no fevers or chills or wheezing and increased dyspnea. She had an ER visit on 7/25, chest x-ray was noted normal was given duo nebs and discharged. Her dyspnea persisted and she came back to the ER on 7/27-she was noted to be in SVT with ventricular rate in the 180s given adenosine-underlying rhythm seemed to be atrial flutter, started  on Cardizem drip and transferred to [facility] under cardiology service. Labs showed leukocytosis and chest x-ray showed increased interstitial prominence to my review  SUBJECTIVE: BiPAP overnight. Converted to normal sinus rhythm overnight on IV amiodarone. Patient reports still ongoing intermittent dyspnea. Continues to have intermittent coughing. Denies any chest pain or tightness currently but does report chest tightness with dyspnea intermittently.  REVIEW OF SYSTEMS:   No nausea, vomiting, or abdominal pain. No subjective fever or sweats. No headache or vision changes.  VITAL SIGNS: Temp:  [97.5 F (36.4 C)-97.7 F (36.5 C)] 97.7 F (36.5 C) (07/30 0836) Pulse Rate:  [72-132] 73 (07/30 0800) Resp:  [23-38] 23 (07/30 0800) BP: (111-134)/(61-85) 116/65 (07/30 0800) SpO2:  [87 %-98 %] 91 % (07/30 0800) FiO2 (%):  [40 %] 40 % (07/30 0000) Weight:  [132 lb 11.5 oz (60.2 kg)] 132 lb 11.5 oz (60.2 kg) (07/30 0500)  PHYSICAL EXAMINATION: General:  Awake. Alert. No distress. Up in chair watching TV. Integument:  Warm & dry. No rash on exposed skin.  HEENT:  Moist mucus membranes. No oral ulcers. No scleral injection.  Cardiovascular: Regular rate and rhythm. Unable to appreciate JVD given body positioning. Sinus rhythm on telemetry. Pulmonary:  Mildly increased work of breathing on nasal cannula oxygen. Mild bilateral basilar crackles and wheeze. Abdomen: Soft. Hypoactive sounds. Nondistended. Grossly nontender. Neurological: Alert and oriented 4. Moving all 4 extremities equally. Cranial nerves grossly nonfocal.   Recent Labs Lab 10/31/15 0438 11/01/15 0619 11/02/15 0304  NA 135 129* 133*  K 3.7 3.8 3.6  CL 109 103 105  CO2 16* 10* 16*  BUN 20 23* 33*  CREATININE 1.11* 0.92 1.03*  GLUCOSE 139* 171* 309*    Recent Labs Lab 10/31/15 0438 11/01/15 0619 11/02/15 0304  HGB 11.0* 11.0* 11.1*  HCT 32.9* 30.9* 32.8*  WBC 12.6* 11.6* 10.9*  PLT 158 197 218   Dg Chest Port 1  View  Result Date: 11/01/2015 CLINICAL DATA:  Shortness of breath.  History of COPD. EXAM: PORTABLE CHEST 1 VIEW COMPARISON:  10/31/2015 FINDINGS: The cardiomediastinal silhouette is within normal limits. Thoracic aortic atherosclerosis is again seen. The lungs remain hyperinflated. Diffuse interstitial densities are again seen throughout both lungs and have mildly increased from the prior study, most notably in the right upper lobe although bibasilar opacities also have mildly worsened in the interim. No sizable pleural effusion is identified, although it is difficult to exclude a small right pleural effusion. No pneumothorax is identified. 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 IMPRESSION: Increasing right greater than left lung opacities which may reflect edema or pneumonia. Electronically Signed   By: Logan Bores M.D.   On: 11/01/2015 21:56   ASSESSMENT / PLAN:  75 year old female admitted with severe community acquired pneumonia and atrial fibrillation with rapid ventricular response. Patient now converted to normal sinus rhythm. Suspect she has pulmonary edema from congestive heart failure. Continuing empiric treatment of underlying community-acquired pneumonia. Initiating gentle diuresis with Lasix IV 1. Replacing potassium and phosphorus as well. Given patient's clinical stability she may be able to transition to a stepdown unit today. I will defer to primary service.  1. Acute hypoxic respiratory failure: Continuing to wean FiO2 for saturation 88-94%. Lasix 20 mg IV 1. Intermittent noninvasive positive pressure ventilation for increased work of breathing. 2. Atrial fibrillation with rapid ventricular response: Anticoagulation per cardiology service. Converted to NSR on Amiodarone. Switching over to oral Amiodarone. 3. Severe community acquired pneumonia: Continuing empiric Rocephin & azithromycin Day #4. Plan for empiric 7 days. Awaiting  urine legionella antigen and cultures. 4. Moderate COPD with exacerbation: Continuing Atrovent nebs every 6 hours. Holding home Symbicort. Switching to prednisone 60 mg by mouth daily starting tomorrow. 5. Hypophosphatemia: Replacing with K-Phos 30 mmol IV. Repeat phosphorus in a.m. 6. Constipation:  Sennakot-S bid. 7. Anemia:  Mild. No signs of active bleeding. 8. Leukocytosis:  Resolving. 9. H/O HTN: Vitals per unit protocol. Holding home Norvasc. Currently normotensive. 10. H/O Hyperlipidemia:  Home Zocor 20mg  qhs. 11. Prophylaxis: Systemic anticoagulation for atrial fibrillation. 12. Diet: Continuing heart healthy diet for now. 13. Tobacco use disorder: Tobacco cessation to be addressed again prior to discharge.  Sonia Baller Ashok Cordia, M.D. [facility] Pulmonary & Critical Care Pager:  [phone] After 3pm or if no response, call [phone] 11/02/2015, 11:14 AM

## 2015-11-03 DIAGNOSIS — R0602 Shortness of breath: Secondary | ICD-10-CM

## 2015-11-03 LAB — GLUCOSE, CAPILLARY
Glucose-Capillary: 173 mg/dL — ABNORMAL HIGH (ref 65–99)
Glucose-Capillary: 207 mg/dL — ABNORMAL HIGH (ref 65–99)
Glucose-Capillary: 154 mg/dL — ABNORMAL HIGH (ref 65–99)

## 2015-11-03 LAB — LEGIONELLA PNEUMOPHILA SEROGP 1 UR AG: L. pneumophila Serogp 1 Ur Ag: NEGATIVE

## 2015-11-03 LAB — BASIC METABOLIC PANEL
Anion gap: 10 (ref 5–15)
BUN: 30 mg/dL — ABNORMAL HIGH (ref 6–20)
CO2: 20 mmol/L — ABNORMAL LOW (ref 22–32)
Calcium: 8.8 mg/dL — ABNORMAL LOW (ref 8.9–10.3)
Chloride: 106 mmol/L (ref 101–111)
Creatinine, Ser: 0.85 mg/dL (ref 0.44–1.00)
GFR calc Af Amer: >60 mL/min (ref >60)
GFR calc non Af Amer: >60 mL/min (ref >60)
Glucose, Bld: 194 mg/dL — ABNORMAL HIGH (ref 65–99)
Potassium: 4.5 mmol/L (ref 3.5–5.1)
Sodium: 136 mmol/L (ref 135–145)

## 2015-11-03 MED ORDER — INSULIN ASPART 100 UNIT/ML ~~LOC~~ SOLN
0.0000 [IU] | Freq: Three times a day (TID) | SUBCUTANEOUS | Status: DC
  Administered 2015-11-03: 3 [IU] via SUBCUTANEOUS
  Administered 2015-11-03: 2 [IU] via SUBCUTANEOUS
  Administered 2015-11-04 (×2): 3 [IU] via SUBCUTANEOUS
  Administered 2015-11-05: 2 [IU] via SUBCUTANEOUS
  Administered 2015-11-05: 1 [IU] via SUBCUTANEOUS
  Administered 2015-11-06: 2 [IU] via SUBCUTANEOUS
  Administered 2015-11-06: 3 [IU] via SUBCUTANEOUS
  Administered 2015-11-07 (×2): 2 [IU] via SUBCUTANEOUS
  Administered 2015-11-07: 3 [IU] via SUBCUTANEOUS
  Administered 2015-11-08: 1 [IU] via SUBCUTANEOUS
  Administered 2015-11-08 (×2): 2 [IU] via SUBCUTANEOUS

## 2015-11-03 MED ORDER — POLYETHYLENE GLYCOL 3350 17 G PO PACK
17.0000 g | PACK | Freq: Every day | ORAL | Status: DC
  Administered 2015-11-03 – 2015-11-08 (×6): 17 g via ORAL
  Filled 2015-11-03 (×6): qty 1

## 2015-11-03 MED ORDER — PREDNISONE 20 MG PO TABS
40.0000 mg | ORAL_TABLET | Freq: Every day | ORAL | Status: DC
  Administered 2015-11-04 – 2015-11-05 (×2): 40 mg via ORAL
  Filled 2015-11-03 (×2): qty 2

## 2015-11-03 MED ORDER — FUROSEMIDE 10 MG/ML IJ SOLN
20.0000 mg | Freq: Once | INTRAMUSCULAR | Status: AC
  Administered 2015-11-03: 20 mg via INTRAVENOUS
  Filled 2015-11-03: qty 2

## 2015-11-03 NOTE — Progress Notes (Signed)
    Subjective:  Denies CP; complains of dyspnea   Objective:  Vitals:   11/03/15 0700 11/03/15 0730 11/03/15 0744 11/03/15 0800  BP: 130/80     Pulse: 97 91  97  Resp: (!) 22 (!) 24  (!) 27  Temp:      TempSrc:      SpO2: (!) 89% 93% 95% 90%  Weight:      Height:        Intake/Output from previous day:  Intake/Output Summary (Last 24 hours) at 11/03/15 0826 Last data filed at 11/03/15 0745  Gross per 24 hour  Intake            909.9 ml  Output             4900 ml  Net          -3990.1 ml    Physical Exam: Physical exam: Well-developed well-nourished in mild respiratory distress Skin is warm and dry.  HEENT is normal.  Neck is supple.  Chest with diminished BS throughout Cardiovascular exam is regular rate and rhythm.  Abdominal exam nontender or distended. No masses palpated. Extremities show no edema. neuro grossly intact    Lab Results: Basic Metabolic Panel:  Recent Labs  11/02/15 0304 11/03/15 0415  NA 133* 136  K 3.6 4.5  CL 105 106  CO2 16* 20*  GLUCOSE 309* 194*  BUN 33* 30*  CREATININE 1.03* 0.85  CALCIUM 8.7* 8.8*  MG 2.4  --   PHOS 1.5*  --    CBC:  Recent Labs  11/01/15 0619 11/02/15 0304  WBC 11.6* 10.9*  HGB 11.0* 11.1*  HCT 30.9* 32.8*  MCV 83.1 84.8  PLT 197 218   Cardiac Enzymes:  Recent Labs  10/31/15 0941  TROPONINI 0.09*     Assessment/Plan:  1 atrial fibrillation/flutter-patient remains in sinus rhythm this morning. Continue Cardizem. Continue amiodarone for now. This is likely not a good option long-term given baseline lung disease. Hopefully we can discontinue this in approximately 8 weeks when she recovers from her pneumonia. LV function is normal. Continue apixaban. Discontinue aspirin. 2 pneumonia-continue antibiotics. 3 severe COPD-pulmonary following. Continue bronchodilators and steroids. 4 minimally elevated troponin-likely related to atrial arrhythmias. No clear trend and not consistent with acute  coronary syndrome. 5 hyperlipidemia-continue statin.  Kirk Ruths 11/03/2015, 8:26 AM

## 2015-11-03 NOTE — Care Management Important Message (Signed)
Important Message  Patient Details  Name: Patricia Jackson MRN: [id number] Date of Birth: January 29, 1941   Medicare Important Message Given:  Yes    Loann Quill 11/03/2015, 3:14 PM

## 2015-11-03 NOTE — Progress Notes (Signed)
Inpatient Diabetes Program Recommendations  AACE/ADA: New Consensus Statement on Inpatient Glycemic Control (2015)  Target Ranges:  Prepandial:   less than 140 mg/dL      Peak postprandial:   less than 180 mg/dL (1-2 hours)      Critically ill patients:  140 - 180 mg/dL   Results for Patricia Jackson, Patricia Jackson (MRN [id number]) as of 11/03/2015 08:22  Ref. Range 11/02/2015 03:04 11/03/2015 04:15  Glucose Latest Ref Range: 65 - 99 mg/dL 309 (H) 194 (H)  Results for Patricia Jackson, Patricia Jackson (MRN [id number]) as of 11/03/2015 08:22  Ref. Range 10/30/2015 23:19  Hemoglobin A1C Latest Ref Range: 4.8 - 5.6 % 5.6   Review of Glycemic Control  Diabetes history: No Outpatient Diabetes medications: NA Current orders for Inpatient glycemic control: None  Inpatient Diabetes Program Recommendations: Correction (SSI): Patient is ordered steroids which is likely cause of hyperglycemia. Lab glucose 194 mg/dl today. While inpatient and ordered steroids, please consider ordering CBGs with Novolog correction scale ACHS.  Thanks, Barnie Alderman, RN, MSN, CDE Diabetes Coordinator Inpatient Diabetes Program [phone] (Team Pager from [place] to [place]) [phone] ([facility] office) [phone] [facility] office) [phone] [facility] office)

## 2015-11-03 NOTE — Significant Event (Signed)
Patient transferred safely to 2H28, taken via wheelchair. VS stable prior and during the transfer. Belongings include cellphone, charger, and a comb-all taken to new room. Report given to charge RN Erin. Patient settled in bed per her requests-staff in room to accept patient. Patient's family aware of the transfer and new room number. Aili Casillas, Therapist, sports.

## 2015-11-03 NOTE — Progress Notes (Signed)
Name: Patricia Jackson MRN: [id number] DOB: [birth year]    ADMISSION DATE:  10/30/2015 CONSULTATION DATE:  11/03/2015   REFERRING MD :  Debara Pickett, MD  CHIEF COMPLAINT:  Normal chest x-ray, respiratory distress  BRIEF PATIENT DESCRIPTION: 75 year old smoker with COPD admitted via [facility] ED with atrial fibrillation/RVR, found to have periodic or pneumonia  SIGNIFICANT EVENTS  7/25 - ED visit 7/27 - admission  STUDIES:  PFT 11/21/13: FVC 2.54 L (75%) FEV1 1.68 L (66%) FEV1/FVC 0.66 FEF 25-75 0.83 L (40%) no bronchodilator response TLC 5.65 L (102%) RV 138% DLCO uncorrected 40% PORT CXR 7/28: Increased bilateral interstitial markings. No obvious pleural effusion. TTE 7/28: LV without regional wall motion abnormalities. LA & RA normal in size. RV normal in size and function. Pulmonary artery systolic pressure 41 mmHg. No aortic stenosis or regurgitation. No mitral stenosis but mild to moderate regurgitation directed centrally. Trivial tricuspid regurgitation. Poorly visualized pulmonic valve. No pericardial effusion. Port CXR 7/29:  Worsening bilateral opacities R > L.  MICROBIOLOGY: MRSA PCR 7/27:  Negative Blood Ctx x2 7/27>>> Urine Strep Ag 7/28: Negative Urine Legionella Ag 7/28>>>  ANTIBIOTICS: Rocephin 7/27>>> Azithromycin 7/27>>>  LINES/TUBES: PIV x2  HISTORY OF PRESENT ILLNESS:  75 year old heavy smoker who has moved from [place] to [place] about 3 years ago. She established with Dr. Nevada Crane was told that she has COPD and placed on albuterol nebs and what seems like albuterol MDI. For about a week she reports cough with clear sputum production, no fevers or chills or wheezing and increased dyspnea. She had an ER visit on 7/25, chest x-ray was noted normal was given duo nebs and discharged. Her dyspnea persisted and she came back to the ER on 7/27-she was noted to be in SVT with ventricular rate in the 180s given adenosine-underlying rhythm seemed to be atrial flutter, started  on Cardizem drip and transferred to [facility] under cardiology service. Labs showed leukocytosis and chest x-ray showed increased interstitial prominence to my review  SUBJECTIVE: BiPAP overnight. Remains in sr, no NIMV now  VITAL SIGNS: Temp:  [97.3 F (36.3 C)-97.9 F (36.6 C)] 97.3 F (36.3 C) (07/31 0850) Pulse Rate:  [69-97] 97 (07/31 0800) Resp:  [22-37] 27 (07/31 0800) BP: (101-140)/(61-84) 130/80 (07/31 0700) SpO2:  [86 %-96 %] 90 % (07/31 0800) FiO2 (%):  [40 %] 40 % (07/31 0400) Weight:  [59.9 kg (132 lb 0.9 oz)] 59.9 kg (132 lb 0.9 oz) (07/31 0500)  PHYSICAL EXAMINATION: General:  Awake. Alert. No distress. Up in chair, no distress Integument:  No rash HEENT; jvd wnl Cardiovascular: s1 s2 RRR Pulmonary:  CTa anterior Abdomen: Soft. Hypoactive sounds. Nondistended. Grossly nontender. Neurological: Alert and oriented 4. Moving all 4 extremities equally. Cranial nerves grossly nonfocal.   Recent Labs Lab 11/01/15 0619 11/02/15 0304 11/03/15 0415  NA 129* 133* 136  K 3.8 3.6 4.5  CL 103 105 106  CO2 10* 16* 20*  BUN 23* 33* 30*  CREATININE 0.92 1.03* 0.85  GLUCOSE 171* 309* 194*    Recent Labs Lab 10/31/15 0438 11/01/15 0619 11/02/15 0304  HGB 11.0* 11.0* 11.1*  HCT 32.9* 30.9* 32.8*  WBC 12.6* 11.6* 10.9*  PLT 158 197 218   Dg Chest Port 1 View  Result Date: 11/01/2015 CLINICAL DATA:  Shortness of breath.  History of COPD. EXAM: PORTABLE CHEST 1 VIEW COMPARISON:  10/31/2015 FINDINGS: The cardiomediastinal silhouette is within normal limits. Thoracic aortic atherosclerosis is again seen. The lungs remain hyperinflated. Diffuse interstitial densities are  again seen throughout both lungs and have mildly increased from the prior study, most notably in the right upper lobe although bibasilar opacities also have mildly worsened in the interim. No sizable pleural effusion is identified, although it is difficult to exclude a small right pleural effusion. No  pneumothorax is identified. 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 IMPRESSION: Increasing right greater than left lung opacities which may reflect edema or pneumonia. Electronically Signed   By: Logan Bores M.D.   On: 11/01/2015 21:56   ASSESSMENT / PLAN:  75 year old female admitted with severe community acquired pneumonia and atrial fibrillation with rapid ventricular response.   1. Acute hypoxic respiratory failure: 2. Atrial fibrillation with rapid ventricular response: 3. Severe community acquired pneumonia:  4. Moderate COPD with exacerbation:  5. Hypophosphatemia: 6. Constipation:  7. Anemia:  Mild. No signs of active bleeding. 8. Leukocytosis:  Resolving. 9. H/O HTN: Vitals per unit protocol. 10. H/O Hyperlipidemia:   11. Prophylaxis: Systemic anticoagulation for atrial fibrillation. 12. Diet: Continuing heart healthy diet for now. 13. Tobacco use disorder: Tobacco cessation to be addressed again prior to discharge.  Add ssi on pred pred to 40 in setting cap , no wheezing Bders pcxr repeat in am  Dc nimv daytuime bipap nocturnal Lasix repeat did well with this Move to sdu, no distress Remains in SR, per cards Add stop date azithro and ceftriaxone, placed D/w cards Add mirilaz May need ducl supp      Lavon Paganini. Titus Mould, MD, [place] Pgr: [place] [facility]

## 2015-11-03 NOTE — Progress Notes (Signed)
Patient states she does not wear BIPAP or CPAP at home. Patient does not want to wear BIPAP tonight. Patient said she will call if she needs it. Patient in no distress at this time.

## 2015-11-04 ENCOUNTER — Inpatient Hospital Stay (HOSPITAL_COMMUNITY): Payer: Medicare Other

## 2015-11-04 LAB — BASIC METABOLIC PANEL
Anion gap: 7 (ref 5–15)
BUN: 30 mg/dL — ABNORMAL HIGH (ref 6–20)
CO2: 27 mmol/L (ref 22–32)
Calcium: 9.1 mg/dL (ref 8.9–10.3)
Chloride: 103 mmol/L (ref 101–111)
Creatinine, Ser: 0.93 mg/dL (ref 0.44–1.00)
GFR calc Af Amer: >60 mL/min (ref >60)
GFR calc non Af Amer: 60 mL/min — ABNORMAL LOW (ref >60)
Glucose, Bld: 133 mg/dL — ABNORMAL HIGH (ref 65–99)
Potassium: 4.8 mmol/L (ref 3.5–5.1)
Sodium: 137 mmol/L (ref 135–145)

## 2015-11-04 LAB — GLUCOSE, CAPILLARY
Glucose-Capillary: 103 mg/dL — ABNORMAL HIGH (ref 65–99)
Glucose-Capillary: 224 mg/dL — ABNORMAL HIGH (ref 65–99)
Glucose-Capillary: 209 mg/dL — ABNORMAL HIGH (ref 65–99)
Glucose-Capillary: 278 mg/dL — ABNORMAL HIGH (ref 65–99)

## 2015-11-04 MED ORDER — WHITE PETROLATUM GEL
Status: AC
  Administered 2015-11-04: 18:00:00
  Filled 2015-11-04: qty 1

## 2015-11-04 MED ORDER — FUROSEMIDE 20 MG PO TABS
20.0000 mg | ORAL_TABLET | Freq: Every day | ORAL | Status: DC
  Administered 2015-11-04 – 2015-11-06 (×3): 20 mg via ORAL
  Filled 2015-11-04 (×3): qty 1

## 2015-11-04 NOTE — Progress Notes (Signed)
    Subjective:  Denies CP; complains of dyspnea but improving   Objective:  Vitals:   11/04/15 0000 11/04/15 0400 11/04/15 0441 11/04/15 0825  BP: 126/82 (!) 142/71  131/79  Pulse: 82 75  88  Resp: 20 18  (!) 27  Temp:    97.6 F (36.4 C)  TempSrc:    Oral  SpO2: 92% 96%  92%  Weight:   125 lb (56.7 kg)   Height:        Intake/Output from previous day:  Intake/Output Summary (Last 24 hours) at 11/04/15 0848 Last data filed at 11/04/15 0800  Gross per 24 hour  Intake              740 ml  Output             3300 ml  Net            -2560 ml    Physical Exam: Physical exam: Well-developed well-nourished in NAD Skin is warm and dry.  HEENT is normal.  Neck is supple.  Chest with diminished BS throughout Cardiovascular exam is regular rate and rhythm.  Abdominal exam nontender or distended. No masses palpated. Extremities show no edema. neuro grossly intact    Lab Results: Basic Metabolic Panel:  Recent Labs  11/02/15 0304 11/03/15 0415 11/04/15 0243  NA 133* 136 137  K 3.6 4.5 4.8  CL 105 106 103  CO2 16* 20* 27  GLUCOSE 309* 194* 133*  BUN 33* 30* 30*  CREATININE 1.03* 0.85 0.93  CALCIUM 8.7* 8.8* 9.1  MG 2.4  --   --   PHOS 1.5*  --   --    CBC:  Recent Labs  11/02/15 0304  WBC 10.9*  HGB 11.1*  HCT 32.8*  MCV 84.8  PLT 218    Assessment/Plan:  1 atrial fibrillation/flutter-patient remains in sinus rhythm this morning. Continue Cardizem. Continue amiodarone for now. This is likely not a good option long-term given baseline lung disease. Hopefully we can discontinue this in approximately 8 weeks when she recovers from her pneumonia. LV function is normal. Continue apixaban.  2 pneumonia-continue antibiotics. Will add low dose lasix given chest xray results although not volume overloaded on exam. 3 severe COPD-pulmonary following. Continue bronchodilators and steroids. 4 minimally elevated troponin-likely related to atrial arrhythmias. No  clear trend and not consistent with acute coronary syndrome. 5 hyperlipidemia-continue statin.  Kirk Ruths 11/04/2015, 8:48 AM

## 2015-11-04 NOTE — Progress Notes (Signed)
Name: Patricia Jackson MRN: [id number] DOB: Aug 22, 1940    ADMISSION DATE:  10/30/2015 CONSULTATION DATE:  11/04/2015   REFERRING MD :  Debara Pickett, MD  CHIEF COMPLAINT:  Normal chest x-ray, respiratory distress  BRIEF PATIENT DESCRIPTION: 75 year old smoker with COPD admitted via [facility] ED with atrial fibrillation/RVR, found to have pneumonia  SIGNIFICANT EVENTS  7/25 - ED visit 7/27 - admission  STUDIES:  PFT 11/21/13: FVC 2.54 L (75%) FEV1 1.68 L (66%) FEV1/FVC 0.66 FEF 25-75 0.83 L (40%) no bronchodilator response TLC 5.65 L (102%) RV 138% DLCO uncorrected 40% PORT CXR 7/28: Increased bilateral interstitial markings. No obvious pleural effusion. TTE 7/28: LV without regional wall motion abnormalities. LA & RA normal in size. RV normal in size and function. Pulmonary artery systolic pressure 41 mmHg. No aortic stenosis or regurgitation. No mitral stenosis but mild to moderate regurgitation directed centrally. Trivial tricuspid regurgitation. Poorly visualized pulmonic valve. No pericardial effusion. Port CXR 7/29:  Worsening bilateral opacities R > L.  MICROBIOLOGY: MRSA PCR 7/27:  Negative Blood Ctx x2 7/27>>> Urine Strep Ag 7/28: Negative Urine Legionella Ag 7/28>>>  ANTIBIOTICS: Rocephin 7/27>>> Azithromycin 7/27>>>  LINES/TUBES: PIV x2  HISTORY OF PRESENT ILLNESS:  75 year old heavy smoker who has moved from [place] to [place] about 3 years ago. She established with Dr. Nevada Crane was told that she has COPD and placed on albuterol nebs and what seems like albuterol MDI. For about a week she reports cough with clear sputum production, no fevers or chills or wheezing and increased dyspnea. She had an ER visit on 7/25, chest x-ray was noted normal was given duo nebs and discharged. Her dyspnea persisted and she came back to the ER on 7/27-she was noted to be in SVT with ventricular rate in the 180s given adenosine-underlying rhythm seemed to be atrial flutter, started on Cardizem  drip and transferred to [facility] under cardiology service. Labs showed leukocytosis and chest x-ray showed increased interstitial prominence to my review  SUBJECTIVE: Off bipap today AM. States that reathing has improved  VITAL SIGNS: Temp:  [97.3 F (36.3 C)-98 F (36.7 C)] 97.6 F (36.4 C) (08/01 0825) Pulse Rate:  [75-104] 88 (08/01 0825) Resp:  [18-34] 27 (08/01 0825) BP: (120-146)/(70-82) 131/79 (08/01 0825) SpO2:  [89 %-96 %] 95 % (08/01 0943) Weight:  [125 lb (56.7 kg)] 125 lb (56.7 kg) (08/01 0441)  PHYSICAL EXAMINATION: General:  Awake. Alert. No distress. No distress Integument:  No rash HEENT; No thyromegaly or JVD Cardiovascular: S1, S2, RRR Pulmonary:  Clear, no wheeze or crackles Abdomen: Soft. Hypoactive sounds. Nondistended. Grossly nontender. Neurological: Alert and oriented 4. Moving all 4 extremities equally. Cranial nerves grossly nonfocal.   Recent Labs Lab 11/02/15 0304 11/03/15 0415 11/04/15 0243  NA 133* 136 137  K 3.6 4.5 4.8  CL 105 106 103  CO2 16* 20* 27  BUN 33* 30* 30*  CREATININE 1.03* 0.85 0.93  GLUCOSE 309* 194* 133*    Recent Labs Lab 10/31/15 0438 11/01/15 0619 11/02/15 0304  HGB 11.0* 11.0* 11.1*  HCT 32.9* 30.9* 32.8*  WBC 12.6* 11.6* 10.9*  PLT 158 197 218   Dg Chest Port 1 View  Result Date: 11/04/2015 CLINICAL DATA:  Shortness of breath. History of left-sided breast carcinoma EXAM: PORTABLE CHEST 1 VIEW COMPARISON:  November 01, 2015 FINDINGS: There remains diffuse interstitial edema. There is patchy alveolar opacity in the right upper lobe, likely alveolar edema. There is atelectatic change in the left base. There is a new  small left pleural effusion. There is a small right pleural effusion, stable. There is cardiomegaly. The pulmonary vascularity is within normal limits. There is atherosclerotic calcification in the aorta. No adenopathy evident. No bone lesions. Patient is status post left mastectomy with surgical clips in the left  axillary region. IMPRESSION: Appearance is most consistent with congestive heart failure. A degree of superimposed pneumonia cannot be entirely excluded. There is aortic atherosclerosis. Except for new small left pleural effusion, there is very little change from recent prior study. Electronically Signed   By: Lowella Grip III M.D.   On: 11/04/2015 07:37   ASSESSMENT:  75 year old female admitted with severe community acquired pneumonia and atrial fibrillation with rapid ventricular response.  She has had a slow improvement in resp status.  1. Acute hypoxic respiratory failure: 2. Atrial fibrillation with rapid ventricular response: 3. Severe community acquired pneumonia:  4. Moderate COPD with exacerbation:  5. Hypophosphatemia: 6. Constipation:  7. Anemia:  Mild. No signs of active bleeding. 8. Leukocytosis:  Resolving. 9. H/O HTN: Vitals per unit protocol. 10. H/O Hyperlipidemia:   11. Prophylaxis: Systemic anticoagulation for atrial fibrillation. 12. Diet: Continuing heart healthy diet for now. 13. Tobacco use disorder: Tobacco cessation to be addressed again prior to discharge.  Reccs: - Continue Bipap at night and during the day as needed - Continue pred at current dose. Will start wean tomorrow - Duonebs and albuterol PRN. - Continue azithro, ceftriaxone.  Marshell Garfinkel MD [place] [facility] Pager [phone] If no answer or after 3pm call: [phone] 11/04/2015, 11:08 AM

## 2015-11-04 NOTE — Progress Notes (Signed)
Nutrition Follow-up  DOCUMENTATION CODES:   Not applicable  INTERVENTION:   -Continue Ensure Enlive po BID, each supplement provides 350 kcal and 20 grams of protein  NUTRITION DIAGNOSIS:   Increased nutrient needs related to chronic illness as evidenced by estimated needs.  Ongoing  GOAL:   Patient will meet greater than or equal to 90% of their needs  Progressing  MONITOR:   PO intake, Supplement acceptance, Labs, Weight trends, I & O's  REASON FOR ASSESSMENT:   Malnutrition Screening Tool    ASSESSMENT:   75 yo Female, smoker 1 ppd, HTN, DLD, presented to [facility] ER with worsening cough, SOB and sputum production for the past 1 weeks. No fever, N/V, diaphoresis, syncope. Reports palpitations for the past couple days. At the outside ER, was noted to be in SVT with V rate in 180's, not terminated with Adenosine 6 mg and 12 mg IV but P waves appeared to be atrial flutter (AFL) per  Records. Dilt infusion was initiated. WBC 17, Na 129, Trop negative.   Pt transferred to SDU on 11/03/15.   Spoke with pt at bedside. She reports her appetite is usually poor because she doesn't feel hungry during the day. Meal completion 25%, which pt reports is slowly improving. PTA, she reports she would typically consume two pieces of toast for breakfast and consume a dinner (meat, starch, and vegetable). She considered purchasing Ensure supplements PTA, but "I didn't want to spend the money on them if I didn't want them". Pt reports she is consuming the Ensure supplements and enjoys them. She is amenable to continue them at home, which this RD encouraged.  Pt reports UBW is around 130-135#. She reports she has lost weight gradually. She shares that she has always been small framed, petite in stature.  Nutrition-Focused physical exam completed. Findings are no fat depletion, mild muscle depletion, and no edema.   Noted no BM since 10/23/15; pt was been ordered laxative and stool softeners  per RN.  Labs reviewed: CBGS: 103-204.   Diet Order:  Diet Heart Room service appropriate? Yes; Fluid consistency: Thin  Skin:  Reviewed, no issues  Last BM:  10/23/15  Height:   Ht Readings from Last 1 Encounters:  10/30/15 5\' 6"  (1.676 m)    Weight:   Wt Readings from Last 1 Encounters:  11/04/15 125 lb (56.7 kg)    Ideal Body Weight:  59 kg  BMI:  Body mass index is 20.18 kg/m.  Estimated Nutritional Needs:   Kcal:  1500-1700  Protein:  85-95 gm  Fluid:  1.5-1.7 L  EDUCATION NEEDS:   No education needs identified at this time  Patricia Jackson A. Jimmye Norman, RD, LDN, CDE Pager: [phone] After hours Pager: [phone]

## 2015-11-04 NOTE — Care Management Note (Addendum)
Case Management Note  Patient Details  Name: Patricia Jackson MRN: [id number] Date of Birth: 1940-11-27  Subjective/Objective:       Adm w exacerb of bronchitis             Action/Plan: lives w fam, pcp dr hall   Expected Discharge Date:   (pending)               Expected Discharge Plan:  Home/Self Care  In-House Referral:     Discharge planning Services  CM Consult, Medication Assistance  Post Acute Care Choice:    Choice offered to:     DME Arranged:    DME Agency:     HH Arranged:    HH Agency:     Status of Service:  In process, will continue to follow  If discussed at Long Length of Stay Meetings, dates discussed:    Additional Comments: gave pt 30day free eliquis card./W SHANISE @ [employer] # [id number]   ELIQUIS 5 MG BID (30 )   COVER- YES  CO-PAY- $ 45.00  TIER- 3 DRUG  PRIOR APPROVAL - YES # [phone]  PHARMACY: [facility], [facility] AND Shawnie Pons, RN 11/04/2015, 10:43 AM

## 2015-11-05 LAB — CULTURE, BLOOD (ROUTINE X 2)
Culture: NO GROWTH
Culture: NO GROWTH

## 2015-11-05 LAB — BASIC METABOLIC PANEL
Anion gap: 8 (ref 5–15)
BUN: 39 mg/dL — ABNORMAL HIGH (ref 6–20)
CO2: 28 mmol/L (ref 22–32)
Calcium: 9 mg/dL (ref 8.9–10.3)
Chloride: 99 mmol/L — ABNORMAL LOW (ref 101–111)
Creatinine, Ser: 0.9 mg/dL (ref 0.44–1.00)
GFR calc Af Amer: >60 mL/min (ref >60)
GFR calc non Af Amer: >60 mL/min (ref >60)
Glucose, Bld: 108 mg/dL — ABNORMAL HIGH (ref 65–99)
Potassium: 4.5 mmol/L (ref 3.5–5.1)
Sodium: 135 mmol/L (ref 135–145)

## 2015-11-05 LAB — GLUCOSE, CAPILLARY
Glucose-Capillary: 84 mg/dL (ref 65–99)
Glucose-Capillary: 152 mg/dL — ABNORMAL HIGH (ref 65–99)
Glucose-Capillary: 176 mg/dL — ABNORMAL HIGH (ref 65–99)
Glucose-Capillary: 133 mg/dL — ABNORMAL HIGH (ref 65–99)
Glucose-Capillary: 227 mg/dL — ABNORMAL HIGH (ref 65–99)

## 2015-11-05 MED ORDER — PREDNISONE 20 MG PO TABS
30.0000 mg | ORAL_TABLET | Freq: Every day | ORAL | Status: DC
  Administered 2015-11-06 – 2015-11-08 (×3): 30 mg via ORAL
  Filled 2015-11-05 (×3): qty 1

## 2015-11-05 MED ORDER — DIPHENHYDRAMINE HCL 25 MG PO CAPS
25.0000 mg | ORAL_CAPSULE | Freq: Four times a day (QID) | ORAL | Status: DC | PRN
  Administered 2015-11-05: 25 mg via ORAL
  Filled 2015-11-05: qty 1

## 2015-11-05 MED ORDER — AMIODARONE HCL 200 MG PO TABS
200.0000 mg | ORAL_TABLET | Freq: Every day | ORAL | Status: DC
  Administered 2015-11-05 – 2015-11-08 (×4): 200 mg via ORAL
  Filled 2015-11-05 (×4): qty 1

## 2015-11-05 MED ORDER — IPRATROPIUM-ALBUTEROL 0.5-2.5 (3) MG/3ML IN SOLN
3.0000 mL | Freq: Three times a day (TID) | RESPIRATORY_TRACT | Status: DC
  Administered 2015-11-05 – 2015-11-08 (×8): 3 mL via RESPIRATORY_TRACT
  Filled 2015-11-05 (×10): qty 3

## 2015-11-05 NOTE — Progress Notes (Signed)
Pt. Refused bipap. Pt.states she does not wear. RT informed pt. To notify if she changes her mind.

## 2015-11-05 NOTE — Progress Notes (Signed)
Report given to [place] on [place] has no s/s of any acute distress.  No c/o pain.

## 2015-11-05 NOTE — Progress Notes (Signed)
    Subjective:  Denies CP; complains of dyspnea but continues to improve   Objective:  Vitals:   11/04/15 2329 11/05/15 0000 11/05/15 0200 11/05/15 0400  BP:  137/77  (!) 146/80  Pulse:  86  77  Resp:  (!) 22  19  Temp: 97.4 F (36.3 C)   97.6 F (36.4 C)  TempSrc: Oral   Oral  SpO2:  95%  98%  Weight:   124 lb 1.6 oz (56.3 kg)   Height:        Intake/Output from previous day:  Intake/Output Summary (Last 24 hours) at 11/05/15 0740 Last data filed at 11/04/15 2300  Gross per 24 hour  Intake              350 ml  Output              350 ml  Net                0 ml    Physical Exam: Physical exam: Well-developed well-nourished in NAD Skin is warm and dry.  HEENT is normal.  Neck is supple.  Chest with diminished BS throughout Cardiovascular exam is regular rate and rhythm.  Abdominal exam nontender or distended. No masses palpated. Extremities show no edema. neuro grossly intact    Lab Results: Basic Metabolic Panel:  Recent Labs  11/04/15 0243 11/05/15 0247  NA 137 135  K 4.8 4.5  CL 103 99*  CO2 27 28  GLUCOSE 133* 108*  BUN 30* 39*  CREATININE 0.93 0.90  CALCIUM 9.1 9.0    Assessment/Plan:  1 atrial fibrillation/flutter-patient remains in sinus rhythm this morning. Continue Cardizem. Continue amiodarone but change to 200 mg daily. This is likely not a good option long-term given baseline lung disease. Hopefully we can discontinue this in approximately 8 weeks when she recovers from her pneumonia. LV function is normal. Continue apixaban.  2 pneumonia-continue antibiotics.  3 severe COPD-pulmonary following. Continue bronchodilators and steroids. 4 minimally elevated troponin-likely related to atrial arrhythmias. No clear trend and not consistent with acute coronary syndrome. 5 hyperlipidemia-continue statin. Transfer to telemetry and ambulate Kirk Ruths 11/05/2015, 7:40 AM

## 2015-11-05 NOTE — Progress Notes (Signed)
Name: Patricia Jackson MRN: [id number] DOB: 1940-05-23    ADMISSION DATE:  10/30/2015 CONSULTATION DATE:  11/05/2015   REFERRING MD :  Debara Pickett, MD  CHIEF COMPLAINT:  Normal chest x-ray, respiratory distress  BRIEF PATIENT DESCRIPTION: 75 year old smoker with COPD admitted via [facility] ED with atrial fibrillation/RVR, found to have pneumonia  SIGNIFICANT EVENTS  7/25 - ED visit 7/27 - admission  STUDIES:  PFT 11/21/13: FVC 2.54 L (75%) FEV1 1.68 L (66%) FEV1/FVC 0.66 FEF 25-75 0.83 L (40%) no bronchodilator response TLC 5.65 L (102%) RV 138% DLCO uncorrected 40% PORT CXR 7/28: Increased bilateral interstitial markings. No obvious pleural effusion. TTE 7/28: LV without regional wall motion abnormalities. LA & RA normal in size. RV normal in size and function. Pulmonary artery systolic pressure 41 mmHg. No aortic stenosis or regurgitation. No mitral stenosis but mild to moderate regurgitation directed centrally. Trivial tricuspid regurgitation. Poorly visualized pulmonic valve. No pericardial effusion. Port CXR 7/29:  Worsening bilateral opacities R > L.  MICROBIOLOGY: MRSA PCR 7/27:  Negative Blood Ctx x2 7/27>>> Urine Strep Ag 7/28: Negative Urine Legionella Ag 7/28>>>  ANTIBIOTICS: Rocephin 7/27>>>8/2 Azithromycin 7/27>>>  LINES/TUBES: PIV x2  HISTORY OF PRESENT ILLNESS:  75 year old heavy smoker who has moved from [place] to [place] about 3 years ago. She established with Dr. Nevada Crane was told that she has COPD and placed on albuterol nebs and what seems like albuterol MDI. For about a week she reports cough with clear sputum production, no fevers or chills or wheezing and increased dyspnea. She had an ER visit on 7/25, chest x-ray was noted normal was given duo nebs and discharged. Her dyspnea persisted and she came back to the ER on 7/27-she was noted to be in SVT with ventricular rate in the 180s given adenosine-underlying rhythm seemed to be atrial flutter, started on  Cardizem drip and transferred to [facility] under cardiology service. Labs showed leukocytosis and chest x-ray showed increased interstitial prominence to my review  SUBJECTIVE: Has not required bipap since night before last.  Denies SOB at rest.  Still some DOE.  C/o itching.   VITAL SIGNS: Temp:  [97.3 F (36.3 C)-98 F (36.7 C)] 98 F (36.7 C) (08/02 1111) Pulse Rate:  [76-102] 102 (08/02 1100) Resp:  [19-28] 28 (08/02 1100) BP: (118-146)/(69-80) 135/70 (08/02 0800) SpO2:  [93 %-100 %] 94 % (08/02 1100) FiO2 (%):  [40 %] 40 % (08/01 1955) Weight:  [56.3 kg (124 lb 1.6 oz)] 56.3 kg (124 lb 1.6 oz) (08/02 0200)  PHYSICAL EXAMINATION: General:  Awake. Alert. No distress. Integument:  No rash HEENT; No thyromegaly or JVD Cardiovascular: S1, S2, RRR Pulmonary:  resps even non labored on [place], slightly diminished bases otherwise Clear, no wheeze or crackles Abdomen: Soft. Hypoactive sounds. Nondistended. Grossly nontender. Neurological: Alert and oriented 4. Moving all 4 extremities equally. Cranial nerves grossly nonfocal.   Recent Labs Lab 11/03/15 0415 11/04/15 0243 11/05/15 0247  NA 136 137 135  K 4.5 4.8 4.5  CL 106 103 99*  CO2 20* 27 28  BUN 30* 30* 39*  CREATININE 0.85 0.93 0.90  GLUCOSE 194* 133* 108*    Recent Labs Lab 10/31/15 0438 11/01/15 0619 11/02/15 0304  HGB 11.0* 11.0* 11.1*  HCT 32.9* 30.9* 32.8*  WBC 12.6* 11.6* 10.9*  PLT 158 197 218   Dg Chest Port 1 View  Result Date: 11/04/2015 CLINICAL DATA:  Shortness of breath. History of left-sided breast carcinoma EXAM: PORTABLE CHEST 1 VIEW COMPARISON:  November 01, 2015 FINDINGS: There remains diffuse interstitial edema. There is patchy alveolar opacity in the right upper lobe, likely alveolar edema. There is atelectatic change in the left base. There is a new small left pleural effusion. There is a small right pleural effusion, stable. There is cardiomegaly. The pulmonary vascularity is within normal limits. There  is atherosclerotic calcification in the aorta. No adenopathy evident. No bone lesions. Patient is status post left mastectomy with surgical clips in the left axillary region. IMPRESSION: Appearance is most consistent with congestive heart failure. A degree of superimposed pneumonia cannot be entirely excluded. There is aortic atherosclerosis. Except for new small left pleural effusion, there is very little change from recent prior study. Electronically Signed   By: Lowella Grip III M.D.   On: 11/04/2015 07:37   ASSESSMENT:  75 year old female admitted with severe community acquired pneumonia and atrial fibrillation with rapid ventricular response.  She has had a slow improvement in resp status.  1. Acute hypoxic respiratory failure: 2. Atrial fibrillation with rapid ventricular response: 3. Severe community acquired pneumonia:  4. Moderate COPD with exacerbation:  5. Hypophosphatemia: 6. Constipation:  7. Anemia:  Mild. No signs of active bleeding. 8. Leukocytosis:  Resolving. 9. H/O HTN: Vitals per unit protocol. 10. H/O Hyperlipidemia:   11. Prophylaxis: Systemic anticoagulation for atrial fibrillation. 12. Diet: Continuing heart healthy diet for now. 13. Tobacco use disorder: Tobacco cessation to be addressed again prior to discharge.  Reccs: - supplemental O2 - wean as able to keep sats >92%, did not previously wear O2 at home.  - continue prednisone with taper - decrease to 100m PO daily 8/2 - Duonebs and albuterol PRN. - Continue ceftriaxone. - HR control  - agree with tx tele   KNickolas Madrid NP 11/05/2015  11:20 AM Pager: [phone] or [phone] Attending note: I have seen and examined the patient with nurse practitioner/resident and agree with the note. History, labs and imaging reviewed.  Improving resp failure secondary to CAP, COPD exacerbation. Continue pred taper, abx, bronchodilators  PMarshell GarfinkelMD [place] [facility] Pager [phone] no answer or after 3pm call: [phone] 11/05/2015, 12:26 PM

## 2015-11-06 DIAGNOSIS — R0602 Shortness of breath: Secondary | ICD-10-CM

## 2015-11-06 DIAGNOSIS — R0609 Other forms of dyspnea: Secondary | ICD-10-CM

## 2015-11-06 LAB — GLUCOSE, CAPILLARY
Glucose-Capillary: 102 mg/dL — ABNORMAL HIGH (ref 65–99)
Glucose-Capillary: 171 mg/dL — ABNORMAL HIGH (ref 65–99)
Glucose-Capillary: 243 mg/dL — ABNORMAL HIGH (ref 65–99)

## 2015-11-06 NOTE — Progress Notes (Signed)
Name: Patricia Jackson MRN: [id number] DOB: 21-Jul-1940    ADMISSION DATE:  10/30/2015 CONSULTATION DATE:  11/06/2015   REFERRING MD :  Debara Pickett, MD  CHIEF COMPLAINT:  Normal chest x-ray, respiratory distress  BRIEF PATIENT DESCRIPTION: 75 year old smoker with COPD admitted via [facility] ED with atrial fibrillation/RVR, found to have pneumonia  SIGNIFICANT EVENTS  7/25 - ED visit 7/27 - admission  STUDIES:  PFT 11/21/13: FVC 2.54 L (75%) FEV1 1.68 L (66%) FEV1/FVC 0.66 FEF 25-75 0.83 L (40%) no bronchodilator response TLC 5.65 L (102%) RV 138% DLCO uncorrected 40% PORT CXR 7/28: Increased bilateral interstitial markings. No obvious pleural effusion. TTE 7/28: LV without regional wall motion abnormalities. LA & RA normal in size. RV normal in size and function. Pulmonary artery systolic pressure 41 mmHg. No aortic stenosis or regurgitation. No mitral stenosis but mild to moderate regurgitation directed centrally. Trivial tricuspid regurgitation. Poorly visualized pulmonic valve. No pericardial effusion. Port CXR 7/29:  Worsening bilateral opacities R > L.  MICROBIOLOGY: MRSA PCR 7/27:  Negative Blood Ctx x2 7/27>>> Urine Strep Ag 7/28: Negative Urine Legionella Ag 7/28>>>  ANTIBIOTICS: Rocephin 7/27>>>8/2 Azithromycin 7/27>>>  LINES/TUBES: PIV x2  HISTORY OF PRESENT ILLNESS:  75 year old heavy smoker who has moved from [place] to [place] about 3 years ago. She established with Dr. Nevada Crane was told that she has COPD and placed on albuterol nebs and what seems like albuterol MDI. For about a week she reports cough with clear sputum production, no fevers or chills or wheezing and increased dyspnea. She had an ER visit on 7/25, chest x-ray was noted normal was given duo nebs and discharged. Her dyspnea persisted and she came back to the ER on 7/27-she was noted to be in SVT with ventricular rate in the 180s given adenosine-underlying rhythm seemed to be atrial flutter, started on  Cardizem drip and transferred to [facility] under cardiology service. Labs showed leukocytosis and chest x-ray showed increased interstitial prominence to my review  SUBJECTIVE: Has continued to not require BiPAP.  Doing well this AM.  Breathing stable but still on O2.  VITAL SIGNS: Temp:  [97.6 F (36.4 C)-98.3 F (36.8 C)] 97.6 F (36.4 C) (08/03 0419) Pulse Rate:  [70-91] 81 (08/03 0419) Resp:  [18-22] 22 (08/03 0419) BP: (109-126)/(49-76) 126/76 (08/03 0419) SpO2:  [96 %-98 %] 97 % (08/03 0419) Weight:  [121 lb 4.8 oz (55 kg)] 121 lb 4.8 oz (55 kg) (08/03 0419)  PHYSICAL EXAMINATION: General:  Elderly female, in no distress. Integument:  No rash HEENT; No thyromegaly or JVD Cardiovascular: S1, S2, RRR Pulmonary:  resps even non labored on [place], slightly diminished bases otherwise Clear, no wheeze or crackles Abdomen: Soft. Hypoactive sounds. Nondistended. Grossly nontender. Neurological: Alert and oriented 4. Moving all 4 extremities equally. Cranial nerves grossly nonfocal.   Recent Labs Lab 11/03/15 0415 11/04/15 0243 11/05/15 0247  NA 136 137 135  K 4.5 4.8 4.5  CL 106 103 99*  CO2 20* 27 28  BUN 30* 30* 39*  CREATININE 0.85 0.93 0.90  GLUCOSE 194* 133* 108*    Recent Labs Lab 10/31/15 0438 11/01/15 0619 11/02/15 0304  HGB 11.0* 11.0* 11.1*  HCT 32.9* 30.9* 32.8*  WBC 12.6* 11.6* 10.9*  PLT 158 197 218   No results found. ASSESSMENT:  75 year old female admitted with severe community acquired pneumonia and atrial fibrillation with rapid ventricular response.  She has had a slow improvement in resp status.  1. Acute hypoxic respiratory failure: 2. Atrial fibrillation  with rapid ventricular response: 3. Severe community acquired pneumonia:  4. Moderate COPD with exacerbation:  5. Hypophosphatemia: 6. Constipation:  7. Anemia:  Mild. No signs of active bleeding. 8. Leukocytosis:  Resolving. 9. H/O HTN: Vitals per unit protocol. 10. H/O Hyperlipidemia:    11. Prophylaxis: Systemic anticoagulation for atrial fibrillation. 12. Diet: Continuing heart healthy diet for now. 13. Tobacco use disorder: Tobacco cessation to be addressed again prior to discharge.  Reccs: - supplemental O2 - wean as able to keep sats >92%, did not previously wear O2 at home - ambulatory desat study to assess home O2 needs - continue prednisone with standard taper to off - decreased to 15m PO daily 8/2 - Duonebs and albuterol PRN - Continue ceftriaxone - HR control  - f/u appointment made with pulmonary (Nickolas Madrid NP) on 11/12/15 at 11:30AM.  Nothing further from pulmonary standpoint.  PCCM will sign off.  Please do not hesitate to call [email or web address] if we can be of any further assistance.   RMontey Hora PCorral ViejoPulmonary & Critical Care Medicine Pager: [phone] or [phone]/06/2015, 12:05 PM   Attending note: I have seen and examined the patient with nurse practitioner/resident and agree with the note. History, labs and imaging reviewed.  Improving resp failure secondary to CAP, COPD exacerbation. Transferred from SDU to floor yesterday Continue abx, bronchodilators Pred taper  PCCM will be available as needed. Please call with any questions  PMarshell GarfinkelMD [place] [facility] Pager [phone] no answer or after 3pm call: [phone] 11/05/2015, 12:26 PM

## 2015-11-06 NOTE — Progress Notes (Signed)
Patient ambulated to bathroom and back to bed and oxygen saturation stayed 97% on room air.  Oxygen turned off.  Will continue to monitor.

## 2015-11-06 NOTE — Care Management Important Message (Signed)
Important Message  Patient Details  Name: Patricia Jackson MRN: [id number] Date of Birth: Dec 28, 1940   Medicare Important Message Given:  Yes    Loann Quill 11/06/2015, 10:48 AM

## 2015-11-06 NOTE — Consult Note (Signed)
   [facility] CM Inpatient Consult   11/06/2015  Filippa Yarbough 12/19/1940 [id number]  Patient discussed in progression meeting. Patient is a 75 year old smoker with COPD admitted via [facility] ED with atrial fibrillation/RVR, found to have pneumonia per MD note.    Met with the patient regarding the benefits of [place] [facility] services.  Explained that [facility] [facility] is a covered benefit of insurance. Review information for [facility] Care Management and a brochure was provided with contact information.  Explained that [facility] [place] [facility] does not interfere with or replace any services arranged by the inpatient care management staff.  Patient declined services with [place] [facility] stating, "I am planning to go to [place] for a few months to be with my sister in law to recuperate."   Patient accepted the brochure and contact information was given.  For questions, please contact:  Natividad Brood, RN BSN [facility] Liaison  [phone] business mobile phone Toll free office [phone]

## 2015-11-06 NOTE — Progress Notes (Signed)
    Subjective:  Denies CP; complains of dyspnea but continues to improve   Objective:  Vitals:   11/05/15 2018 11/05/15 2036 11/05/15 2359 11/06/15 0419  BP: (!) 109/49  123/68 126/76  Pulse: 70  91 81  Resp: 18  18 (!) 22  Temp: 97.8 F (36.6 C)  98.3 F (36.8 C) 97.6 F (36.4 C)  TempSrc: Oral  Oral Oral  SpO2: 96% 96% 98% 97%  Weight:    121 lb 4.8 oz (55 kg)  Height:        Intake/Output from previous day:  Intake/Output Summary (Last 24 hours) at 11/06/15 0835 Last data filed at 11/06/15 3606  Gross per 24 hour  Intake              957 ml  Output              700 ml  Net              257 ml    Physical Exam: Physical exam: Well-developed well-nourished in NAD Skin is warm and dry.  HEENT is normal.  Neck is supple.  Chest with diminished BS throughout; mild exp wheeze Cardiovascular exam is regular rate and rhythm.  Abdominal exam nontender or distended. No masses palpated. Extremities show no edema. neuro grossly intact    Lab Results: Basic Metabolic Panel:  Recent Labs  11/04/15 0243 11/05/15 0247  NA 137 135  K 4.8 4.5  CL 103 99*  CO2 27 28  GLUCOSE 133* 108*  BUN 30* 39*  CREATININE 0.93 0.90  CALCIUM 9.1 9.0    Assessment/Plan:  1 atrial fibrillation/flutter-patient remains in sinus rhythm this morning. Continue Cardizem. Continue amiodarone 200 mg daily. This is likely not a good option long-term given baseline lung disease. Hopefully we can discontinue this in approximately 8 weeks when she recovers from her pneumonia. LV function is normal. Continue apixaban.  2 pneumonia-continue antibiotics.  3 severe COPD-pulmonary following. Continue bronchodilators and steroids. 4 minimally elevated troponin-likely related to atrial arrhythmias. No clear trend and not consistent with acute coronary syndrome. 5 hyperlipidemia-continue statin. Will plan DC when ok with pulmonary Kirk Ruths 11/06/2015, 8:35 AM

## 2015-11-07 LAB — CBC
HCT: 35.2 % — ABNORMAL LOW (ref 36.0–46.0)
Hemoglobin: 11.5 g/dL — ABNORMAL LOW (ref 12.0–15.0)
MCH: 28.8 pg (ref 26.0–34.0)
MCHC: 32.7 g/dL (ref 30.0–36.0)
MCV: 88.2 fL (ref 78.0–100.0)
Platelets: 310 10*3/uL (ref 150–400)
RBC: 3.99 MIL/uL (ref 3.87–5.11)
RDW: 16.7 % — ABNORMAL HIGH (ref 11.5–15.5)
WBC: 14.1 10*3/uL — ABNORMAL HIGH (ref 4.0–10.5)

## 2015-11-07 LAB — BASIC METABOLIC PANEL
Anion gap: 9 (ref 5–15)
BUN: 47 mg/dL — ABNORMAL HIGH (ref 6–20)
CO2: 28 mmol/L (ref 22–32)
Calcium: 9 mg/dL (ref 8.9–10.3)
Chloride: 99 mmol/L — ABNORMAL LOW (ref 101–111)
Creatinine, Ser: 1.07 mg/dL — ABNORMAL HIGH (ref 0.44–1.00)
GFR calc Af Amer: 59 mL/min — ABNORMAL LOW (ref >60)
GFR calc non Af Amer: 51 mL/min — ABNORMAL LOW (ref >60)
Glucose, Bld: 168 mg/dL — ABNORMAL HIGH (ref 65–99)
Potassium: 4.5 mmol/L (ref 3.5–5.1)
Sodium: 136 mmol/L (ref 135–145)

## 2015-11-07 LAB — GLUCOSE, CAPILLARY
Glucose-Capillary: 214 mg/dL — ABNORMAL HIGH (ref 65–99)
Glucose-Capillary: 160 mg/dL — ABNORMAL HIGH (ref 65–99)
Glucose-Capillary: 165 mg/dL — ABNORMAL HIGH (ref 65–99)
Glucose-Capillary: 174 mg/dL — ABNORMAL HIGH (ref 65–99)

## 2015-11-07 NOTE — Progress Notes (Signed)
1750 resting in bed  ACE inhibitor therapy was not .Marland Kitchen Apparent distress

## 2015-11-07 NOTE — Progress Notes (Signed)
    Subjective:  Denies CP; no dyspnea; mild cough   Objective:  Vitals:   11/06/15 1227 11/06/15 1942 11/07/15 0409 11/07/15 0634  BP: (!) 116/57   123/71  Pulse: 83   76  Resp: 20   18  Temp: 98.1 F (36.7 C)   98 F (36.7 C)  TempSrc: Oral   Oral  SpO2: 97% 95%  96%  Weight:   120 lb 12.8 oz (54.8 kg)   Height:        Intake/Output from previous day:  Intake/Output Summary (Last 24 hours) at 11/07/15 0944 Last data filed at 11/07/15 0902  Gross per 24 hour  Intake             1080 ml  Output             2150 ml  Net            -1070 ml    Physical Exam: Physical exam: Well-developed well-nourished in NAD Skin is warm and dry.  HEENT is normal.  Neck is supple.  Chest with diminished BS throughout Cardiovascular exam is regular rate and rhythm.  Abdominal exam nontender or distended. No masses palpated. Extremities show no edema. neuro grossly intact    Lab Results: Basic Metabolic Panel:  Recent Labs  11/05/15 0247 11/07/15 0315  NA 135 136  K 4.5 4.5  CL 99* 99*  CO2 28 28  GLUCOSE 108* 168*  BUN 39* 47*  CREATININE 0.90 1.07*  CALCIUM 9.0 9.0    Assessment/Plan:  1 atrial fibrillation/flutter-patient remains in sinus rhythm this morning. Continue Cardizem. Continue amiodarone 200 mg daily. This is likely not a good option long-term given baseline lung disease. Hopefully we can discontinue this in approximately 8 weeks when she recovers from her pneumonia. LV function is normal. Continue apixaban.  2 pneumonia-improved 3 severe COPD-Continue bronchodilators and steroids. FU pulmonary as outpt. 4 minimally elevated troponin-likely related to atrial arrhythmias. No clear trend and not consistent with acute coronary syndrome. 5 hyperlipidemia-continue statin. Plan ambulate today and DC in AM if stable. Kirk Ruths 11/07/2015, 9:44 AM

## 2015-11-07 NOTE — Progress Notes (Addendum)
Pt slept well overnight, is in RA, tab Norco provided for one time complain of back pain, other than that pt did well, no any other sign of SOB and distress noted, one dose of cough medicine provided too. will continue to monitor the patient.

## 2015-11-07 NOTE — Progress Notes (Signed)
Pt does not wear oxygen or bipap at home. Pt does not want to wear bipap at this time, pt stable, sats stable on RA.  RT will place pt on bipap if needed. RT will cont to monitor.

## 2015-11-07 NOTE — Progress Notes (Signed)
Inpatient Diabetes Program Recommendations  AACE/ADA: New Consensus Statement on Inpatient Glycemic Control (2015)  Target Ranges:  Prepandial:   less than 140 mg/dL      Peak postprandial:   less than 180 mg/dL (1-2 hours)      Critically ill patients:  140 - 180 mg/dL  Results for TAKHIA, CARSON (MRN [id number]) as of 11/07/2015 07:59  Ref. Range 11/07/2015 03:15  Glucose Latest Ref Range: 65 - 99 mg/dL 168 (H)   Results for WANELL, NEWVILLE (MRN [id number]) as of 11/07/2015 07:59  Ref. Range 11/06/2015 06:33 11/06/2015 11:31 11/06/2015 16:35 11/06/2015 21:56  Glucose-Capillary Latest Ref Range: 65 - 99 mg/dL 102 (H) 171 (H) 243 (H) 214 (H)   Review of Glycemic Control  Current orders for Inpatient glycemic control: Novolog 0-9 units TID with meals  Inpatient Diabetes Program Recommendations: Correction (SSI): Please consider increasing Novolog correction to moderate scale and adding Novolog bedtime correction scale.  Thanks, Barnie Alderman, RN, MSN, CDE Diabetes Coordinator Inpatient Diabetes Program [phone] (Team Pager from [place] to [place]) [phone] ([facility] office) [phone] [facility] office) [phone] [facility] office)

## 2015-11-08 LAB — BASIC METABOLIC PANEL
Anion gap: 10 (ref 5–15)
BUN: 51 mg/dL — ABNORMAL HIGH (ref 6–20)
CO2: 27 mmol/L (ref 22–32)
Calcium: 9.3 mg/dL (ref 8.9–10.3)
Chloride: 99 mmol/L — ABNORMAL LOW (ref 101–111)
Creatinine, Ser: 1.07 mg/dL — ABNORMAL HIGH (ref 0.44–1.00)
GFR calc Af Amer: 59 mL/min — ABNORMAL LOW (ref >60)
GFR calc non Af Amer: 51 mL/min — ABNORMAL LOW (ref >60)
Glucose, Bld: 130 mg/dL — ABNORMAL HIGH (ref 65–99)
Potassium: 4.7 mmol/L (ref 3.5–5.1)
Sodium: 136 mmol/L (ref 135–145)

## 2015-11-08 LAB — GLUCOSE, CAPILLARY
Glucose-Capillary: 111 mg/dL — ABNORMAL HIGH (ref 65–99)
Glucose-Capillary: 178 mg/dL — ABNORMAL HIGH (ref 65–99)
Glucose-Capillary: 183 mg/dL — ABNORMAL HIGH (ref 65–99)

## 2015-11-08 MED ORDER — AMIODARONE HCL 200 MG PO TABS: 200.0000 mg | ORAL_TABLET | Freq: Every day | ORAL | 5 refills | Status: DC

## 2015-11-08 MED ORDER — PREDNISONE 10 MG PO TABS: 30.0000 mg | ORAL_TABLET | Freq: Every day | ORAL | 0 refills | Status: DC

## 2015-11-08 MED ORDER — APIXABAN 5 MG PO TABS: 5.0000 mg | ORAL_TABLET | Freq: Two times a day (BID) | ORAL | 0 refills | Status: DC

## 2015-11-08 MED ORDER — ALBUTEROL SULFATE (2.5 MG/3ML) 0.083% IN NEBU: 2.5000 mg | INHALATION_SOLUTION | Freq: Four times a day (QID) | RESPIRATORY_TRACT | 12 refills | Status: DC | PRN

## 2015-11-08 MED ORDER — DILTIAZEM HCL ER COATED BEADS 360 MG PO CP24: 360.0000 mg | ORAL_CAPSULE | Freq: Every day | ORAL | 3 refills | Status: DC

## 2015-11-08 MED ORDER — APIXABAN 5 MG PO TABS: 5.0000 mg | ORAL_TABLET | Freq: Two times a day (BID) | ORAL | 3 refills | Status: DC

## 2015-11-08 NOTE — Discharge Summary (Signed)
Discharge Summary    Patient ID: Patricia Jackson,  MRN: [id number], DOB/AGE: Oct 23, 1940 75 y.o.  Admit date: 10/30/2015 Discharge date: 11/08/2015  Primary Care Provider: Wende Neighbors Primary Cardiologist: Dr. Harl Bowie  Discharge Diagnoses    Principal Problem:   Acute exacerbation of chronic bronchitis ([place]) Active Problems:   SOB (shortness of breath)   Atrial flutter with rapid ventricular response (HCC)   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HCC)   COPD exacerbation (HCC)   Dyslipidemia   Elevated troponin   Acute respiratory failure with hypoxia (HCC)   Hypophosphatemia   Shortness of breath   Allergies Allergies  Allergen Reactions  . Sulfa Antibiotics Hives    Diagnostic Studies/Procedures    Echo 10/31/2015 LV EF: 55% -   60%  ------------------------------------------------------------------- Indications:      Atrial flutter 427.32.  ------------------------------------------------------------------- History:   PMH:  Elevated Troponin.  Chronic obstructive pulmonary disease.  Risk factors:  Hypertension.  ------------------------------------------------------------------- Study Conclusions  - Left ventricle: Systolic function was normal. The estimated   ejection fraction was in the range of 55% to 60%. Wall motion was   normal; there were no regional wall motion abnormalities. 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 - Mitral valve: Calcified annulus. There was mild to moderate   regurgitation directed centrally. Valve area by pressure   half-time: 2.34 cm^2. - Pulmonary arteries: Systolic pressure was mildly increased. PA   peak pressure: 41 mm Hg (S).  _____________   History of Present Illness     75 yo woman, smoker 1 ppd, HTN, DLD, presented to [facility] ER with worsening cough, SOB and sputum production for the past 1 weeks. No fever, N/V, diaphoresis, syncope. Reports palpitations for the past couple days. At the outside ER, was noted  to be in SVT with V rate in 180's, not terminated with Adenosine 6 mg and 12 mg IV but P waves appeared to be atrial flutter (AFL) per  Records. Dilt infusion was initiated. WBC 17, Na 129, Trop negative.   Hospital Course     Patient was admitted to cardiology service. Initially she was hypotensive on diltiazem drip with heart rate in the 90s to 100 range. Echo obtained on 7/28 showed EF 55-60%, mild to moderate MR, PA peak pressure 60mmHg. She did have low-grade fever which was thought to be possible acute exacerbation of chronic bronchitis versus pneumonia. Chest x-ray showed COPD with fibrosis and possibly early interstitial edema/pneumonia. She was treated with Rocephin and azithromycin. She was seen by pulmonology service who felt the patient has community acquired pneumonia with underlying COPD. Her initial white blood cell count of 17 did slowly trended down. She was started on eliquis for stroke prevention. Troponin was mildly elevated which was thought to be demand ischemia in the setting of atrial flutter with RVR. Echocardiogram obtained on 10/31/2015 showed EF 55-60%, PA peak pressure 41 mmHg, mild to moderate MR. She did eventually convert to normal sinus rhythm on IV amiodarone which was later transitioned to PO amiodarone. It was felt that long-term amiodarone is not a good option given baseline lung disease, will consider hopefully discontinue this in approximately 8 weeks once she recovered from her pneumonia. Her previous aspirin was discontinued. Pulmonology signed off on 11/06/2015 with outpatient followup. She was weaned off on O2 and stable off oxygen. She was able to ambulate to the bathroom and back with O2 sat staying around 97%.  She was seen in the morning of 11/08/2015, at which time  she was doing well. She denies any chest pain and she does not have significant shortness of breath. She is deemed stable for discharge from cardiology perspective. I have given her a paper Rx to obtain one  week basic metabolic panel before follow-up with Dr. Harl Bowie in 2-4 weeks. I have sent our scheduler staff message to contact the patient. She will also need outpatient pulmonology follow-up which has been arranged.  Emphasis has been placed on tobacco cessation. I have given the patient a week worth of prednisone, I will defer to pulmonology service to decide when to taper off.  I have discussed with [place], although combination of Zocor and amiodarone may increase chance of myalgia, so does lipitor as well. She is on very low dose of Zocor, will continue to monitor for now.   _____________  Discharge Vitals Blood pressure (!) 125/56, pulse 65, temperature 98.2 F (36.8 C), temperature source Oral, resp. rate 20, height 5\' 6"  (1.676 m), weight 121 lb 6.4 oz (55.1 kg), SpO2 96 %.  Filed Weights   11/06/15 0419 11/07/15 0409 11/08/15 0512  Weight: 121 lb 4.8 oz (55 kg) 120 lb 12.8 oz (54.8 kg) 121 lb 6.4 oz (55.1 kg)    Labs & Radiologic Studies     CBC  Recent Labs  11/07/15 0315  WBC 14.1*  HGB 11.5*  HCT 35.2*  MCV 88.2  PLT 99991111   Basic Metabolic Panel  Recent Labs  11/07/15 0315 11/08/15 0355  NA 136 136  K 4.5 4.7  CL 99* 99*  CO2 28 27  GLUCOSE 168* 130*  BUN 47* 51*  CREATININE 1.07* 1.07*  CALCIUM 9.0 9.3   Dg Chest 2 View  Result Date: 10/28/2015 CLINICAL DATA:  Dry cough and shortness of Breath EXAM: CHEST  2 VIEW COMPARISON:  07/07/2014 FINDINGS: Cardiac shadow is stable. Aortic calcifications are again seen and stable. The lungs remain hyperinflated consistent with COPD. Diffuse interstitial changes are again identified without focal infiltrate. No bony abnormality is seen. IMPRESSION: Chronic changes without acute abnormality. Aortic atherosclerosis Electronically Signed   By: Inez Catalina M.D.   On: 10/28/2015 18:08  Dg Chest Port 1 View  Result Date: 11/04/2015 CLINICAL DATA:  Shortness of breath. History of left-sided breast carcinoma EXAM:  PORTABLE CHEST 1 VIEW COMPARISON:  November 01, 2015 FINDINGS: There remains diffuse interstitial edema. There is patchy alveolar opacity in the right upper lobe, likely alveolar edema. There is atelectatic change in the left base. There is a new small left pleural effusion. There is a small right pleural effusion, stable. There is cardiomegaly. The pulmonary vascularity is within normal limits. There is atherosclerotic calcification in the aorta. No adenopathy evident. No bone lesions. Patient is status post left mastectomy with surgical clips in the left axillary region. IMPRESSION: Appearance is most consistent with congestive heart failure. A degree of superimposed pneumonia cannot be entirely excluded. There is aortic atherosclerosis. Except for new small left pleural effusion, there is very little change from recent prior study. Electronically Signed   By: Lowella Grip III M.D.   On: 11/04/2015 07:37   Dg Chest Port 1 View  Result Date: 11/01/2015 CLINICAL DATA:  Shortness of breath.  History of COPD. EXAM: PORTABLE CHEST 1 VIEW COMPARISON:  10/31/2015 FINDINGS: The cardiomediastinal silhouette is within normal limits. Thoracic aortic atherosclerosis is again seen. The lungs remain hyperinflated. Diffuse interstitial densities are again seen throughout both lungs and have mildly increased from the prior study, most notably in  the right upper lobe although bibasilar opacities also have mildly worsened in the interim. No sizable pleural effusion is identified, although it is difficult to exclude a small right pleural effusion. No pneumothorax is identified. 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 IMPRESSION: Increasing right greater than left lung opacities which may reflect edema or pneumonia. Electronically Signed   By: Logan Bores M.D.   On: 11/01/2015 21:56  Dg Chest Port 1 View  Result Date: 10/31/2015 CLINICAL DATA:  Respiratory failure,  shortness of breath, history of COPD, breast malignancy, atrial flutter, current smoker. EXAM: PORTABLE CHEST 1 VIEW COMPARISON:  Portable chest x-ray of October 30, 2015, October 28, 2015 and November 26, 2013. FINDINGS: The lungs remain hyperinflated. The interstitial markings remain increased diffusely. There is no alveolar infiltrate. No pleural effusion is observed but the costophrenic angle on the left is not excluded from the field of view. External pacemaker defibrillator pads are present. The cardiac silhouette is normal in size. The pulmonary vascularity is not engorged. There are calcifications in the wall of the aortic arch. There surgical clips in the high left axillary region. IMPRESSION: COPD with pulmonary fibrotic changes. These interstitial densities are more conspicuous than on the study of July 25th which may reflect of element of acute interstitial edema or pneumonia. There has been significant overall increase in the prominence of the pulmonary interstitium since the November 26, 2013 study. Electronically Signed   By: David  Martinique M.D.   On: 10/31/2015 07:27  Dg Chest Portable 1 View  Result Date: 10/30/2015 CLINICAL DATA:  Chest pain with shortness of breath and tachycardia. EXAM: PORTABLE CHEST 1 VIEW COMPARISON:  10/28/2015 FINDINGS: Cardiac silhouette is normal in size. No mediastinal or hilar masses or convincing adenopathy. Lungs are hyperexpanded. Thickened interstitial markings are stable from prior exam. No evidence of pneumonia or pulmonary edema. No pleural effusion or pneumothorax. The bony thorax is demineralized but grossly intact. IMPRESSION: 1. No acute cardiopulmonary disease. 2. Lung hyperexpansion and chronic interstitial thickening without change from the prior study. Electronically Signed   By: Lajean Manes M.D.   On: 10/30/2015 17:55   Disposition   Pt is being discharged home today in good condition.  Follow-up Plans & Appointments    Follow-up Information     Darlina Sicilian, NP. Go on 11/12/2015.   Specialty:  Pulmonary Disease Why:  Your appointment is at 11:30AM Contact information: [place] [address] [phone]        Carlyle Dolly, MD .   Specialty:  Cardiology Why:  office staff will contact you to arrange office visit with Dr. Harl Bowie, please give [place] a call if you do not hear from [place]. Contact information: [place] [address] [phone]        Wende Neighbors, MD. Schedule an appointment as soon as possible for a visit today.   Specialty:  Internal Medicine Contact information: [place] [address] [phone]          Discharge Instructions    Diet - low sodium heart healthy    Complete by:  As directed   Increase activity slowly    Complete by:  As directed      Discharge Medications   Discharge Medication List as of 11/08/2015  6:25 PM    START taking these medications   Details  amiodarone (PACERONE) 200 MG tablet Take 1 tablet (200 mg total) by mouth  daily., Starting Sat 11/08/2015, Normal    diltiazem (CARDIZEM CD) 360 MG 24 hr capsule Take 1 capsule (360 mg total) by mouth daily., Starting Sat 11/08/2015, Normal    predniSONE (DELTASONE) 10 MG tablet Take 3 tablets (30 mg total) by mouth daily with breakfast., Starting Sat 11/08/2015, Normal      CONTINUE these medications which have CHANGED   Details  albuterol (PROVENTIL) (2.5 MG/3ML) 0.083% nebulizer solution Take 3 mLs (2.5 mg total) by nebulization every 6 (six) hours as needed for wheezing or shortness of breath., Starting Sat 11/08/2015, Print    apixaban (ELIQUIS) 5 MG TABS tablet Take 1 tablet (5 mg total) by mouth 2 (two) times daily., Starting Sat 11/08/2015, Print      CONTINUE these medications which have NOT CHANGED   Details  albuterol (PROVENTIL) (5 MG/ML) 0.5% nebulizer solution Take 0.5 mLs (2.5 mg total) by nebulization every 6 (six) hours as needed for wheezing or shortness of breath.,  Starting Sun 07/07/2014, Print    buPROPion (WELLBUTRIN XL) 300 MG 24 hr tablet Take 1 tablet by mouth daily., Starting Fri 10/10/2015, Historical Med    cimetidine (TAGAMET) 300 MG tablet Take 1 tablet by mouth 3 (three) times daily., Starting Mon 09/29/2015, Historical Med    simvastatin (ZOCOR) 20 MG tablet Take 20 mg by mouth daily., Historical Med    SYMBICORT 160-4.5 MCG/ACT inhaler Inhale 1 puff into the lungs daily. , Starting Fri 10/10/2015, Historical Med    traMADol (ULTRAM) 50 MG tablet Take 1 tablet by mouth., Starting Thu 10/16/2015, Historical Med      STOP taking these medications     amLODipine (NORVASC) 10 MG tablet      aspirin 81 MG tablet           Outstanding Labs/Studies   BMET in 1 week.  Duration of Discharge Encounter   Greater than 30 minutes including physician time.  Signed, Almyra Deforest PA-C 11/08/2015, 10:53 PM

## 2015-11-08 NOTE — Progress Notes (Signed)
Discharge  Instructions and prescriptions given to pt.Pa hao spoken  With [facility] about prescription.Awaiting for transportation to home.

## 2015-11-08 NOTE — Progress Notes (Addendum)
    Subjective:  Denies CP; no dyspnea; mild cough   Objective:  Vitals:   11/07/15 0954 11/07/15 2012 11/08/15 0512 11/08/15 0756  BP:  (!) 119/59 114/65   Pulse:  69 78   Resp:  18 18   Temp:  98 F (36.7 C) 97.8 F (36.6 C)   TempSrc:  Oral Oral   SpO2: 97% 94% 97% 98%  Weight:   121 lb 6.4 oz (55.1 kg)   Height:        Intake/Output from previous day:  Intake/Output Summary (Last 24 hours) at 11/08/15 0850 Last data filed at 11/08/15 0600  Gross per 24 hour  Intake             1164 ml  Output             1051 ml  Net              113 ml    Physical Exam: Physical exam: Well-developed well-nourished in NAD Skin is warm and dry.  HEENT is normal.  Neck is supple.  Chest with diminished BS throughout Cardiovascular exam is regular rate and rhythm.  Abdominal exam nontender or distended. No masses palpated. Extremities show no edema. neuro grossly intact    Lab Results: Basic Metabolic Panel:  Recent Labs  11/07/15 0315 11/08/15 0355  NA 136 136  K 4.5 4.7  CL 99* 99*  CO2 28 27  GLUCOSE 168* 130*  BUN 47* 51*  CREATININE 1.07* 1.07*  CALCIUM 9.0 9.3    Assessment/Plan:  1 atrial fibrillation/flutter-patient remains in sinus rhythm. Continue Cardizem. Continue amiodarone 200 mg daily. This is likely not a good option long-term given baseline lung disease. Hopefully we can discontinue this in approximately 8 weeks when she recovers from her pneumonia. LV function is normal. Continue apixaban.  2 pneumonia-improved 3 severe COPD-Continue bronchodilators and steroids. FU pulmonary as outpt. BUN increased; no further diuresis; may be contribution from steroids. 4 minimally elevated troponin-likely related to atrial arrhythmias. No clear trend and not consistent with acute coronary syndrome. 5 hyperlipidemia-continue statin. Plan DC today and fu with pulmonary, Dr Nevada Crane and Dr Harl Bowie (2-4 weeks). Check BMET one week after DC >30 min PA and physician  time D2 Kirk Ruths 11/08/2015, 8:50 AM

## 2015-11-12 ENCOUNTER — Ambulatory Visit (INDEPENDENT_AMBULATORY_CARE_PROVIDER_SITE_OTHER): Payer: Medicare Other | Admitting: Adult Health

## 2015-11-12 ENCOUNTER — Ambulatory Visit (INDEPENDENT_AMBULATORY_CARE_PROVIDER_SITE_OTHER)
Admission: RE | Admit: 2015-11-12 | Discharge: 2015-11-12 | Disposition: A | Discharge status: Home / Self Care | Payer: Medicare Other | Source: Ambulatory Visit | Attending: Adult Health | Admitting: Adult Health

## 2015-11-12 ENCOUNTER — Encounter: Payer: Self-pay | Admitting: Adult Health

## 2015-11-12 VITALS — BP 120/62 | HR 54 | Temp 97.5°F | Ht 66.0 in | Wt 121.2 lb

## 2015-11-12 DIAGNOSIS — J189 Pneumonia, unspecified organism: Secondary | ICD-10-CM | POA: Diagnosis not present

## 2015-11-12 DIAGNOSIS — J441 Chronic obstructive pulmonary disease with (acute) exacerbation: Secondary | ICD-10-CM

## 2015-11-12 DIAGNOSIS — J9601 Acute respiratory failure with hypoxia: Secondary | ICD-10-CM

## 2015-11-12 MED ORDER — PREDNISONE 10 MG PO TABS: ORAL_TABLET | ORAL | 0 refills | Status: DC

## 2015-11-12 NOTE — Progress Notes (Signed)
Chief Complaint  Patient presents with  . Follow-up    HFU. Pt states her SOB is not back to baseline yet. Pt c/o DOE, non prod cough, worsening tremors. Pt denies CP/tightness and f/c/s.      Tests PFT 11/21/13: FVC 2.54 L (75%) FEV1 1.68 L (66%) FEV1/FVC 0.66 FEF 25-75 0.83 L (40%) no bronchodilator response TLC 5.65 L (102%) RV 138% DLCO uncorrected 40% PORT CXR 7/28: Increased bilateral interstitial markings. No obvious pleural effusion. TTE 7/28: LV without regional wall motion abnormalities. LA & RA normal in size. RV normal in size and function. Pulmonary artery systolic pressure 41 mmHg. No aortic stenosis or regurgitation. No mitral stenosis but mild to moderate regurgitation directed centrally. Trivial tricuspid regurgitation. Poorly visualized pulmonic valve. No pericardial effusion.  Past medical hx Past Medical History:  Diagnosis Date  . Breast cancer ([place])   . COPD (chronic obstructive pulmonary disease) ([place])   . Hypertension      Past surgical hx, Allergies, Family hx, Social hx all reviewed.  Current Outpatient Prescriptions on File Prior to Visit  Medication Sig  . albuterol (PROVENTIL) (2.5 MG/3ML) 0.083% nebulizer solution Take 3 mLs (2.5 mg total) by nebulization every 6 (six) hours as needed for wheezing or shortness of breath.  Marland Kitchen albuterol (PROVENTIL) (5 MG/ML) 0.5% nebulizer solution Take 0.5 mLs (2.5 mg total) by nebulization every 6 (six) hours as needed for wheezing or shortness of breath.  Marland Kitchen amiodarone (PACERONE) 200 MG tablet Take 1 tablet (200 mg total) by mouth daily.  Marland Kitchen apixaban (ELIQUIS) 5 MG TABS tablet Take 1 tablet (5 mg total) by mouth 2 (two) times daily.  Marland Kitchen buPROPion (WELLBUTRIN XL) 300 MG 24 hr tablet Take 1 tablet by mouth daily.  . cimetidine (TAGAMET) 300 MG tablet Take 1 tablet by mouth 3 (three) times daily.  Marland Kitchen diltiazem (CARDIZEM CD) 360 MG 24 hr capsule Take 1 capsule (360 mg total) by mouth daily.  . simvastatin (ZOCOR) 20 MG  tablet Take 20 mg by mouth daily.  . SYMBICORT 160-4.5 MCG/ACT inhaler Inhale 1 puff into the lungs daily.   . traMADol (ULTRAM) 50 MG tablet Take 1 tablet by mouth.   No current facility-administered medications on file prior to visit.      Vital Signs BP 120/62 (BP Location: Right Arm, Cuff Size: Normal)   Pulse (!) 54   Temp 97.5 F (36.4 C) (Oral)   Ht 5' 6"  (1.676 m)   Wt 121 lb 3.2 oz (55 kg)   SpO2 98%   BMI 19.56 kg/m   History of Present Illness Patricia Jackson is a 75 y.o. female heavy smoker with hx COPD, SVT who was recently admitted by cardiology 7/27-8/5 with acute hypoxic respiratory failure r/t AFib with RVR, severe CAP and AECOPD.  She was treated with rocephin/azithro, HR control and steroids.  She had very slow improvement in her respiratory status.  She returns today for hospital f/u.   Feels better but does not feel back to baseline. Still some SOB intermittently.   Some cough, non productive. No fevers, no orthopnea, no BLE edema, no chest pain.  Prednisone is making her "shakey". Quit smoking 2 weeks ago.    Physical Exam  General - pleasant, weak, No distress  ENT - No sinus tenderness, no oral exudate, no LAN Cardiac - s1s2 regular, no murmur Chest - resps even non labored, diminished bases otherwise essentially clear  Back - No focal tenderness Abd - Soft, non-tender Ext - No  edema Neuro - Normal strength Skin - No rashes Psych - normal mood, and behavior   Assessment/Plan  Acute respiratory failure - multifactorial r/t severe CAP, AECOPD c/b Afib with RVR.  Much improved.  Slow to resolve.  COPD   PLAN-  Patient Instructions  Will check a chest XRay today  Congratulations on not smoking -- KEEP IT UP!  Follow up with Dr. Harl Bowie as scheduled (cardiology)  Starting tomorrow - decrease prednisone to 67m (2 tablets) daily for 4 days then 1 daily x 4 days then STOP  Continue symbicort albuterol as needed  Follow up with Dr. NAshok Cordiaor Dr.  MVaughan Brownerin 2-3 months  Please contact office for sooner follow up if symptoms do not improve or worsen or seek emergency care    KNickolas Madrid NP 11/12/2015  11:57 AM

## 2015-11-12 NOTE — Patient Instructions (Signed)
Will check a chest XRay today  Congratulations on not smoking -- KEEP IT UP!  Follow up with Dr. Harl Bowie as scheduled (cardiology)  Starting tomorrow - decrease prednisone to 20mg  (2 tablets) daily for 4 days then 1 daily x 4 days then STOP  Continue symbicort albuterol as needed  Follow up with Dr. Ashok Cordia or Dr. Vaughan Browner in 2-3 months  Please contact office for sooner follow up if symptoms do not improve or worsen or seek emergency care

## 2015-11-14 ENCOUNTER — Other Ambulatory Visit: Payer: Self-pay | Admitting: Physician Assistant

## 2015-11-14 DIAGNOSIS — I10 Essential (primary) hypertension: Secondary | ICD-10-CM | POA: Diagnosis not present

## 2015-11-14 LAB — BASIC METABOLIC PANEL
BUN: 34 mg/dL — ABNORMAL HIGH (ref 7–25)
CO2: 23 mmol/L (ref 20–31)
Calcium: 9.1 mg/dL (ref 8.6–10.4)
Chloride: 106 mmol/L (ref 98–110)
Creat: 1.24 mg/dL — ABNORMAL HIGH (ref 0.60–0.93)
Glucose, Bld: 89 mg/dL (ref 65–99)
Potassium: 4.7 mmol/L (ref 3.5–5.3)
Sodium: 139 mmol/L (ref 135–146)

## 2015-11-19 ENCOUNTER — Telehealth: Payer: Self-pay | Admitting: Cardiology

## 2015-11-19 NOTE — Telephone Encounter (Signed)
Follow Up:    Returning call,concerning her test results.

## 2015-11-19 NOTE — Telephone Encounter (Signed)
Returned pt call,I have no results to give her. She stated maybe she called the wrong office.

## 2015-11-21 ENCOUNTER — Encounter: Payer: Medicare Other | Admitting: Cardiology

## 2015-11-25 ENCOUNTER — Other Ambulatory Visit: Payer: Self-pay | Admitting: *Deleted

## 2015-11-25 DIAGNOSIS — Z79899 Other long term (current) drug therapy: Secondary | ICD-10-CM

## 2015-11-28 DIAGNOSIS — Z79899 Other long term (current) drug therapy: Secondary | ICD-10-CM | POA: Diagnosis not present

## 2015-11-29 LAB — BASIC METABOLIC PANEL
BUN: 31 mg/dL — ABNORMAL HIGH (ref 7–25)
CO2: 19 mmol/L — ABNORMAL LOW (ref 20–31)
Calcium: 8.9 mg/dL (ref 8.6–10.4)
Chloride: 108 mmol/L (ref 98–110)
Creat: 1.41 mg/dL — ABNORMAL HIGH (ref 0.60–0.93)
Glucose, Bld: 145 mg/dL — ABNORMAL HIGH (ref 65–99)
Potassium: 4.5 mmol/L (ref 3.5–5.3)
Sodium: 139 mmol/L (ref 135–146)

## 2015-12-03 ENCOUNTER — Encounter (HOSPITAL_COMMUNITY): Payer: Self-pay

## 2015-12-03 ENCOUNTER — Emergency Department (HOSPITAL_COMMUNITY): Payer: Medicare Other

## 2015-12-03 ENCOUNTER — Emergency Department (HOSPITAL_COMMUNITY)
Admission: EM | Admit: 2015-12-03 | Discharge: 2015-12-04 | Disposition: A | Discharge status: Home / Self Care | Payer: Medicare Other | Attending: Emergency Medicine | Admitting: Emergency Medicine

## 2015-12-03 DIAGNOSIS — R0602 Shortness of breath: Secondary | ICD-10-CM | POA: Diagnosis present

## 2015-12-03 DIAGNOSIS — Z79899 Other long term (current) drug therapy: Secondary | ICD-10-CM | POA: Insufficient documentation

## 2015-12-03 DIAGNOSIS — J441 Chronic obstructive pulmonary disease with (acute) exacerbation: Secondary | ICD-10-CM | POA: Insufficient documentation

## 2015-12-03 DIAGNOSIS — Z87891 Personal history of nicotine dependence: Secondary | ICD-10-CM | POA: Insufficient documentation

## 2015-12-03 DIAGNOSIS — I10 Essential (primary) hypertension: Secondary | ICD-10-CM | POA: Insufficient documentation

## 2015-12-03 DIAGNOSIS — Z853 Personal history of malignant neoplasm of breast: Secondary | ICD-10-CM | POA: Insufficient documentation

## 2015-12-03 LAB — CBC WITH DIFFERENTIAL/PLATELET
Basophils Absolute: 0 10*3/uL (ref 0.0–0.1)
Basophils Relative: 0 %
Eosinophils Absolute: 0.1 10*3/uL (ref 0.0–0.7)
Eosinophils Relative: 2 %
HCT: 36 % (ref 36.0–46.0)
Hemoglobin: 12.1 g/dL (ref 12.0–15.0)
Lymphocytes Relative: 26 %
Lymphs Abs: 1.5 10*3/uL (ref 0.7–4.0)
MCH: 30.3 pg (ref 26.0–34.0)
MCHC: 33.6 g/dL (ref 30.0–36.0)
MCV: 90.2 fL (ref 78.0–100.0)
Monocytes Absolute: 0.7 10*3/uL (ref 0.1–1.0)
Monocytes Relative: 12 %
Neutro Abs: 3.5 10*3/uL (ref 1.7–7.7)
Neutrophils Relative %: 60 %
Platelets: 243 10*3/uL (ref 150–400)
RBC: 3.99 MIL/uL (ref 3.87–5.11)
RDW: 16.2 % — ABNORMAL HIGH (ref 11.5–15.5)
WBC: 5.9 10*3/uL (ref 4.0–10.5)

## 2015-12-03 LAB — BASIC METABOLIC PANEL
Anion gap: 6 (ref 5–15)
BUN: 38 mg/dL — ABNORMAL HIGH (ref 6–20)
CO2: 23 mmol/L (ref 22–32)
Calcium: 9.1 mg/dL (ref 8.9–10.3)
Chloride: 106 mmol/L (ref 101–111)
Creatinine, Ser: 2.09 mg/dL — ABNORMAL HIGH (ref 0.44–1.00)
GFR calc Af Amer: 26 mL/min — ABNORMAL LOW (ref >60)
GFR calc non Af Amer: 23 mL/min — ABNORMAL LOW (ref >60)
Glucose, Bld: 126 mg/dL — ABNORMAL HIGH (ref 65–99)
Potassium: 4 mmol/L (ref 3.5–5.1)
Sodium: 135 mmol/L (ref 135–145)

## 2015-12-03 MED ORDER — PREDNISONE 20 MG PO TABS: 40.0000 mg | ORAL_TABLET | Freq: Every day | ORAL | 0 refills | Status: DC

## 2015-12-03 MED ORDER — ALBUTEROL SULFATE (2.5 MG/3ML) 0.083% IN NEBU
5.0000 mg | INHALATION_SOLUTION | Freq: Once | RESPIRATORY_TRACT | Status: AC
  Administered 2015-12-03: 5 mg via RESPIRATORY_TRACT
  Filled 2015-12-03: qty 6

## 2015-12-03 MED ORDER — SODIUM CHLORIDE 0.9 % IV BOLUS (SEPSIS)
1000.0000 mL | Freq: Once | INTRAVENOUS | Status: AC
  Administered 2015-12-03: 1000 mL via INTRAVENOUS

## 2015-12-03 MED ORDER — METHYLPREDNISOLONE SODIUM SUCC 125 MG IJ SOLR
125.0000 mg | Freq: Once | INTRAMUSCULAR | Status: AC
  Administered 2015-12-03: 125 mg via INTRAVENOUS
  Filled 2015-12-03: qty 2

## 2015-12-03 NOTE — ED Provider Notes (Signed)
[facility] DEPT Provider Note   CSN: [id number] Arrival date & time: 12/03/15  2044  By signing my name below, I, Reola Mosher, attest that this documentation has been prepared under the direction and in the presence of Virgel Manifold, MD. Electronically Signed: Reola Mosher, ED Scribe. 12/03/15. 9:36 PM.  History   Chief Complaint Chief Complaint  Patient presents with  . Shortness of Breath   The history is provided by the patient, medical records and a relative. No language interpreter was used.   HPI Comments: Patricia Jackson is a 75 y.o. female with a PMHx of pulmonary fibrosis, HTN, atrial flutter, COPD, dyslipidemia, and breast cancer, who presents to the Emergency Department complaining of gradual onset, gradually worsening, constant shortness of breath onset today PTA. Pt was recently seen and admitted to [facility] on 10/30/15 w/ dx'd PNA. She was stable and discharged ~8 days later on 11/08/15. She states that her SOB today is similar to the SOB that she was experiencing at that time. Her relative reports that she has since stopped smoking cigarettes (prior smoker of ~1ppdx50 years) since being d/c. Pt reports that she took an at home breathing treatment prior to coming into the ED with minimal relief of her symptoms. No exacerbating factors noted. She is not currently on oxygen therapy at home. Denies cough, fever, leg swelling, or any other associated symptoms.   Past Medical History:  Diagnosis Date  . Breast cancer ([place])   . COPD (chronic obstructive pulmonary disease) ([place])   . Hypertension    Patient Active Problem List   Diagnosis Date Noted  . Shortness of breath   . Acute respiratory failure with hypoxia ([place])   . Hypophosphatemia   . 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[place]) 10/31/2015  . Acute exacerbation of chronic bronchitis ([place]) 10/31/2015  . COPD exacerbation ([place]) 10/31/2015  . Dyslipidemia 10/31/2015  . Elevated troponin 10/31/2015  . Atrial  flutter with rapid ventricular response ([place]) 10/30/2015  . SOB (shortness of breath) 11/26/2013  . Back pain 11/26/2013  . Arm pain 11/26/2013  . Chest pain 11/26/2013  . Hypertension    Past Surgical History:  Procedure Laterality Date  . APPENDECTOMY    . BREAST SURGERY     OB History    Gravida Para Term Preterm AB Living             1   SAB TAB Ectopic Multiple Live Births                 Home Medications    Prior to Admission medications   Medication Sig Start Date End Date Taking? Authorizing Provider  albuterol (PROVENTIL) (2.5 MG/3ML) 0.083% nebulizer solution Take 3 mLs (2.5 mg total) by nebulization every 6 (six) hours as needed for wheezing or shortness of breath. 11/08/15   Almyra Deforest, PA  albuterol (PROVENTIL) (5 MG/ML) 0.5% nebulizer solution Take 0.5 mLs (2.5 mg total) by nebulization every 6 (six) hours as needed for wheezing or shortness of breath. 07/07/14   Daleen Bo, MD  amiodarone (PACERONE) 200 MG tablet Take 1 tablet (200 mg total) by mouth daily. 11/08/15   Almyra Deforest, PA  apixaban (ELIQUIS) 5 MG TABS tablet Take 1 tablet (5 mg total) by mouth 2 (two) times daily. 11/08/15   Almyra Deforest, PA  buPROPion (WELLBUTRIN XL) 300 MG 24 hr tablet Take 1 tablet by mouth daily. 10/10/15   Historical Provider, MD  cimetidine (TAGAMET) 300 MG tablet Take 1 tablet by mouth 3 (  three) times daily. 09/29/15   Historical Provider, MD  diltiazem (CARDIZEM CD) 360 MG 24 hr capsule Take 1 capsule (360 mg total) by mouth daily. 11/08/15   Almyra Deforest, PA  predniSONE (DELTASONE) 10 MG tablet 2 tabs po daily x 4 days then 1 tab po daily x 4 days then STOP 11/12/15   Marijean Heath, NP  simvastatin (ZOCOR) 20 MG tablet Take 20 mg by mouth daily.    Historical Provider, MD  SYMBICORT 160-4.5 MCG/ACT inhaler Inhale 1 puff into the lungs daily.  10/10/15   Historical Provider, MD  traMADol (ULTRAM) 50 MG tablet Take 1 tablet by mouth. 10/16/15   Historical Provider, MD   Family History Family History    Problem Relation Age of Onset  . Heart disease Mother   . COPD Father    Social History Social History  Substance Use Topics  . Smoking status: Former Smoker    Packs/day: 1.00    Years: 50.00    Types: Cigarettes    Quit date: 10/29/2015  . Smokeless tobacco: Never Used  . Alcohol use No   Allergies   Sulfa antibiotics  Review of Systems Review of Systems  Constitutional: Negative for fever.  Respiratory: Positive for shortness of breath. Negative for cough.   Cardiovascular: Negative for leg swelling.  All other systems reviewed and are negative.  Physical Exam Updated Vital Signs BP 141/70 (BP Location: Left Arm)   Pulse (!) 59   Temp 98.6 F (37 C) (Oral)   Resp (!) 36   Ht 5' 6"  (1.676 m)   Wt 120 lb (54.4 kg)   SpO2 100%   BMI 19.37 kg/m   Physical Exam  Constitutional: She appears well-developed and well-nourished.  HENT:  Head: Normocephalic and atraumatic.  Eyes: Conjunctivae are normal. Right eye exhibits no discharge. Left eye exhibits no discharge.  Neck: Normal range of motion.  Cardiovascular: Normal rate, regular rhythm, normal heart sounds and intact distal pulses.   No murmur heard. Pulmonary/Chest: Tachypnea noted. She is in respiratory distress. She has no wheezes. She has no rales.  Coarse breath sounds bilaterally, worse in the left lower lobe.   Musculoskeletal: Normal range of motion. She exhibits no edema or tenderness.  Neurological: She is alert. Coordination normal.  Skin: Skin is warm and dry. No rash noted. She is not diaphoretic. No erythema.  Psychiatric: She has a normal mood and affect.  Nursing note and vitals reviewed.  ED Treatments / Results  DIAGNOSTIC STUDIES: Oxygen Saturation is 100% on RA, normal by my interpretation.   COORDINATION OF CARE: 9:34 PM-Discussed next steps with pt. Pt verbalized understanding and is agreeable with the plan.   Labs (all labs ordered are listed, but only abnormal results are  displayed) Labs Reviewed  CBC WITH DIFFERENTIAL/PLATELET - Abnormal; Notable for the following:       Result Value   RDW 16.2 (*)    All other components within normal limits  BASIC METABOLIC PANEL - Abnormal; Notable for the following:    Glucose, Bld 126 (*)    BUN 38 (*)    Creatinine, Ser 2.09 (*)    GFR calc non Af Amer 23 (*)    GFR calc Af Amer 26 (*)    All other components within normal limits    EKG  EKG Interpretation  Date/Time:  Wednesday December 03 2015 21:55:41 EDT Ventricular Rate:  65 PR Interval:    QRS Duration: 94 QT Interval:  460 QTC  Calculation: 479 R Axis:   82 Text Interpretation:  Sinus rhythm Borderline right axis deviation Confirmed by Wilson Singer  MD, Delainy Mcelhiney ([id number]) on 12/03/2015 11:02:03 PM      Radiology No results found.    No results found.  Procedures Procedures (including critical care time)  Medications Ordered in ED Medications  albuterol (PROVENTIL) (2.5 MG/3ML) 0.083% nebulizer solution 5 mg (5 mg Nebulization Given 12/03/15 2124)  methylPREDNISolone sodium succinate (SOLU-MEDROL) 125 mg/2 mL injection 125 mg (125 mg Intravenous Given 12/03/15 2153)  sodium chloride 0.9 % bolus 1,000 mL (0 mLs Intravenous Stopped 12/04/15 0005)   Initial Impression / Assessment and Plan / ED Course  I have reviewed the triage vital signs and the nursing notes.  Pertinent labs & imaging results that were available during my care of the patient were reviewed by me and considered in my medical decision making (see chart for details).  Clinical Course   75 year old female with dyspnea. Clinically suspect COPD exacerbation. Some wheezing on exam. No acute infiltrate. Improvement in bed. Oxygen saturations are adequate. She'll be continued on steroids. Doubt ACS. Doubt PE or other emergent pathology. Renal function is worsening from her baseline. She is making urine. She reports no significant change in her fluid intake. At this point would like to monitor it.  She needs outpatient follow-up with her PCP and repeat BMP in a few days to about a week.  Final Clinical Impressions(s) / ED Diagnoses   Final diagnoses:  COPD exacerbation ([place])   New Prescriptions New Prescriptions   No medications on file    I personally preformed the services scribed in my presence. The recorded information has been reviewed is accurate. Virgel Manifold, MD.     Virgel Manifold, MD 12/14/15 1700

## 2015-12-03 NOTE — ED Triage Notes (Signed)
Reports of shortness of breath over the past few weeks but worse today. [facility] admitted [facility] for [facility].

## 2015-12-04 ENCOUNTER — Telehealth: Payer: Self-pay | Admitting: Physician Assistant

## 2015-12-04 ENCOUNTER — Telehealth: Payer: Self-pay | Admitting: *Deleted

## 2015-12-04 DIAGNOSIS — I4892 Unspecified atrial flutter: Secondary | ICD-10-CM

## 2015-12-04 NOTE — Telephone Encounter (Signed)
Returned pts call and advised pt that she can have no more than 2 Liters of fluid intake a day. Pt verbalized appreciation for the call.

## 2015-12-04 NOTE — Telephone Encounter (Signed)
-----   Message from [place], [place] sent at 12/03/2015  6:41 PM EDT ----- Unclear why her kidney function continue to worsen little by little, recheck BUN and Cr next Friday before reassessment by Dr. Harl Bowie in the office. Please make sure patient is not having worsening SOB, worsening renal function can be caused by either too much or too little fluid. If she is not having worsening SOB, she can try to drink a little more fluid to see if there is any improvement. However if she does have sign of fluid overload such as swelling of leg and increasing SOB, she will need to cut back on fluid.

## 2015-12-04 NOTE — Telephone Encounter (Signed)
Pt aware of her lab results.  She advised me that she had to go to ER @ [facility] last night due to SOB.   Check her labs and her cr is worsened than when we checked it in office. I advised pt of your plans as of now, to reduce fluid intake and recheck bmet 9/8/170 Pt was advised I would have you check her labs from yesterdays er visit and if you came up with a different plan, we would call back, otherwise, continue with plan for now. Pt agreeable with this and verbalized understanding.

## 2015-12-04 NOTE — Telephone Encounter (Signed)
New message       Patient request to talk to the same nurse again because she forgot how much fluids she was told to take.  Please call

## 2015-12-05 DIAGNOSIS — I4892 Unspecified atrial flutter: Secondary | ICD-10-CM | POA: Diagnosis not present

## 2015-12-05 DIAGNOSIS — Z720 Tobacco use: Secondary | ICD-10-CM | POA: Diagnosis not present

## 2015-12-05 DIAGNOSIS — R944 Abnormal results of kidney function studies: Secondary | ICD-10-CM | POA: Diagnosis not present

## 2015-12-05 DIAGNOSIS — J188 Other pneumonia, unspecified organism: Secondary | ICD-10-CM | POA: Diagnosis not present

## 2015-12-05 DIAGNOSIS — J449 Chronic obstructive pulmonary disease, unspecified: Secondary | ICD-10-CM | POA: Diagnosis not present

## 2015-12-09 ENCOUNTER — Other Ambulatory Visit: Payer: Self-pay

## 2015-12-09 ENCOUNTER — Other Ambulatory Visit: Payer: Self-pay | Admitting: Cardiology

## 2015-12-09 MED ORDER — APIXABAN 5 MG PO TABS: 5.0000 mg | ORAL_TABLET | Freq: Two times a day (BID) | ORAL | 6 refills | Status: DC

## 2015-12-09 MED ORDER — APIXABAN 5 MG PO TABS
5.0000 mg | ORAL_TABLET | Freq: Two times a day (BID) | ORAL | 6 refills | Status: DC
Start: 2015-12-09 — End: 2015-12-09

## 2015-12-09 NOTE — Telephone Encounter (Signed)
Needed PA for eliquis,await PA form from [facility] pharmacy

## 2015-12-09 NOTE — Telephone Encounter (Signed)
refill eliquis 

## 2015-12-11 ENCOUNTER — Telehealth: Payer: Self-pay

## 2015-12-11 NOTE — Telephone Encounter (Signed)
Prior auth for Eliquis 5mg  initiated through [facility]. It will be 24-48 hours before we know if it's approved.

## 2015-12-12 ENCOUNTER — Other Ambulatory Visit: Payer: Self-pay | Admitting: *Deleted

## 2015-12-12 DIAGNOSIS — I4892 Unspecified atrial flutter: Secondary | ICD-10-CM

## 2015-12-12 LAB — BASIC METABOLIC PANEL
BUN: 32 mg/dL — ABNORMAL HIGH (ref 7–25)
CO2: 23 mmol/L (ref 20–31)
Calcium: 9.3 mg/dL (ref 8.6–10.4)
Chloride: 106 mmol/L (ref 98–110)
Creat: 1.56 mg/dL — ABNORMAL HIGH (ref 0.60–0.93)
Glucose, Bld: 97 mg/dL (ref 65–99)
Potassium: 5 mmol/L (ref 3.5–5.3)
Sodium: 139 mmol/L (ref 135–146)

## 2015-12-16 ENCOUNTER — Encounter: Payer: Self-pay | Admitting: Cardiology

## 2015-12-16 ENCOUNTER — Ambulatory Visit (INDEPENDENT_AMBULATORY_CARE_PROVIDER_SITE_OTHER): Payer: Medicare Other | Admitting: Cardiology

## 2015-12-16 VITALS — BP 126/70 | HR 61 | Ht 66.0 in | Wt 127.0 lb

## 2015-12-16 DIAGNOSIS — R0789 Other chest pain: Secondary | ICD-10-CM

## 2015-12-16 DIAGNOSIS — R42 Dizziness and giddiness: Secondary | ICD-10-CM

## 2015-12-16 DIAGNOSIS — I6523 Occlusion and stenosis of bilateral carotid arteries: Secondary | ICD-10-CM

## 2015-12-16 DIAGNOSIS — I4892 Unspecified atrial flutter: Secondary | ICD-10-CM | POA: Diagnosis not present

## 2015-12-16 MED ORDER — MECLIZINE HCL 25 MG PO TABS: 25.0000 mg | ORAL_TABLET | Freq: Three times a day (TID) | ORAL | 0 refills | Status: DC | PRN

## 2015-12-16 NOTE — Patient Instructions (Signed)
Medication Instructions:  START MECLIZINE 25 MG EVERY 8 HOURS AS NEEDED FOR DIZZINESS  Labwork: NONE  Testing/Procedures: NONE  Follow-Up: Your physician recommends that you schedule a follow-up appointment in: 6 WEEKS    Any Other Special Instructions Will Be Listed Below (If Applicable).  WE HAVE REFERRED YOU TO DR. EARLY, SOMEONE FROM HIS OFFICE WILL CONTACT YOU SOON.    If you need a refill on your cardiac medications before your next appointment, please call your pharmacy.

## 2015-12-16 NOTE — Progress Notes (Signed)
Clinical Summary Ms. Bowdish is a 75 y.o.female seen today for follow up of the following medical problems.   1. Atypical chest pain - recent admission with chest pain - described as back pain radiating down both arms that woke her from sleep -started lower neck/upper back, radiatiated to both arms  Sharp pain, worst with position. Pain lasted approx 1 hour. No SOB, no diaphoresis, no N/V. No recurrence of pain since discharge - no evidence of ACS by EKG or enzymes - echo 11/2013 LVEF 31-59%, grade I diastolic dysfunction - CT chest without aneurysm or dissection, no PE. +atherosclerotic changes in aorta.  - continues to have atypical chest pain. pain can start left or right shoulder. Radiates down both arms. Not positional. Can last up to 10 minutes. Occurs 3-4 times a week. Similar to pain she had 2 years ago.     2. Aflutter - admit 11/2015 pneumonia, found to be in aflutter - started on amio and eliquis, plans for short term of amiodarone 8 weeks.  - no recent palpitations. No bleeding troubles   3. COPD - followed by pulmonary   4. Dizziness - episode occurred while sitting this morning. No associated palpitations. Felt nauseous, feeling of room spinning. No presyncope. Can occur while lying, sitting or standing.    Past Medical History:  Diagnosis Date  . Breast cancer ([place])   . COPD (chronic obstructive pulmonary disease) ([place])   . Hypertension      Allergies  Allergen Reactions  . Sulfa Antibiotics Hives     Current Outpatient Prescriptions  Medication Sig Dispense Refill  . albuterol (PROVENTIL) (2.5 MG/3ML) 0.083% nebulizer solution Take 3 mLs (2.5 mg total) by nebulization every 6 (six) hours as needed for wheezing or shortness of breath. 75 mL 12  . amiodarone (PACERONE) 200 MG tablet Take 1 tablet (200 mg total) by mouth daily. 30 tablet 5  . apixaban (ELIQUIS) 5 MG TABS tablet Take 1 tablet (5 mg total) by mouth 2 (two) times daily. 60 tablet 6  .  buPROPion (WELLBUTRIN XL) 300 MG 24 hr tablet Take 1 tablet by mouth daily.    . cimetidine (TAGAMET) 300 MG tablet Take 1 tablet by mouth 3 (three) times daily.    Marland Kitchen diltiazem (CARDIZEM CD) 360 MG 24 hr capsule Take 1 capsule (360 mg total) by mouth daily. 90 capsule 3  . predniSONE (DELTASONE) 20 MG tablet Take 2 tablets (40 mg total) by mouth daily. 6 tablet 0  . simvastatin (ZOCOR) 20 MG tablet Take 20 mg by mouth daily.    . SYMBICORT 160-4.5 MCG/ACT inhaler Inhale 1 puff into the lungs daily.     . traMADol (ULTRAM) 50 MG tablet Take 1 tablet by mouth every 6 (six) hours as needed for moderate pain or severe pain.      No current facility-administered medications for this visit.      Past Surgical History:  Procedure Laterality Date  . APPENDECTOMY    . BREAST SURGERY       Allergies  Allergen Reactions  . Sulfa Antibiotics Hives      Family History  Problem Relation Age of Onset  . Heart disease Mother   . COPD Father      Social History Ms. Moncada reports that she quit smoking about 6 weeks ago. Her smoking use included Cigarettes. She has a 50.00 pack-year smoking history. She has never used smokeless tobacco. Ms. Paolella reports that she does not drink alcohol.  Review of Systems CONSTITUTIONAL: No weight loss, fever, chills, weakness or fatigue.  HEENT: Eyes: No visual loss, blurred vision, double vision or yellow sclerae.No hearing loss, sneezing, congestion, runny nose or sore throat.  SKIN: No rash or itching.  CARDIOVASCULAR: per HPI RESPIRATORY: No shortness of breath, cough or sputum.  GASTROINTESTINAL: No anorexia, nausea, vomiting or diarrhea. No abdominal pain or blood.  GENITOURINARY: No burning on urination, no polyuria NEUROLOGICAL: No headache, dizziness, syncope, paralysis, ataxia, numbness or tingling in the extremities. No change in bowel or bladder control.  MUSCULOSKELETAL: No muscle, back pain, joint pain or stiffness.  LYMPHATICS: No  enlarged nodes. No history of splenectomy.  PSYCHIATRIC: No history of depression or anxiety.  ENDOCRINOLOGIC: No reports of sweating, cold or heat intolerance. No polyuria or polydipsia.  Marland Kitchen   Physical Examination Vitals:   12/16/15 0824  BP: 126/70  Pulse: 61   Vitals:   12/16/15 0824  Weight: 127 lb (57.6 kg)  Height: 5' 6"  (1.676 m)    Gen: resting comfortably, no acute distress HEENT: no scleral icterus, pupils equal round and reactive, no palptable cervical adenopathy,  CV: RRR, no m/r/g, no jvd Resp: Clear to auscultation bilaterally GI: abdomen is soft, non-tender, non-distended, normal bowel sounds, no hepatosplenomegaly MSK: extremities are warm, no edema.  Skin: warm, no rash Neuro: +vertigo Psych: appropriate affect   Diagnostic Studies 11/2013 Echo Study Conclusions  - Left ventricle: The cavity size was normal. Wall thickness was at the upper limits of normal. Systolic function was vigorous. The estimated ejection fraction was in the range of 65% to 70%. Wall motion was normal; there were no regional wall motion abnormalities. Doppler parameters are consistent with abnormal left ventricular relaxation (grade 1 diastolic dysfunction). Doppler parameters are consistent with elevated ventricular end-diastolic filling pressure. - Mitral valve: Calcified annulus. There was mild regurgitation. - Right atrium: Central venous pressure (est): 3 mm Hg. - Atrial septum: No defect or patent foramen ovale was identified. - Tricuspid valve: There was trivial regurgitation. - Pulmonary arteries: PA peak pressure: 28 mm Hg (S). - Pericardium, extracardiac: There was no pericardial effusion.  Impressions:  - Upper normal LV wall thickness with LVEF 37-54%, grade 1 diastolic dysfunction with increased filling pressures. Mild mitral regurgitation. Trivial tricuspid regurgitation with normal PASP 28 mmHg.  10/2015 Echo Study Conclusions  - Left ventricle: Systolic  function was normal. The estimated   ejection fraction was in the range of 55% to 60%. Wall motion was   normal; there were no regional wall motion abnormalities. 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 - Mitral valve: Calcified annulus. There was mild to moderate   regurgitation directed centrally. Valve area by pressure   half-time: 2.34 cm^2. - Pulmonary arteries: Systolic pressure was mildly increased. PA   peak pressure: 41 mm Hg (S).  Assessment and Plan  1. Atypical chest pain - atypical symptoms, unchanged since last visit - continue to monitor, no indication for stress testing at this time  2. Aflutter - continue current meds  3. Dizziness - symptoms consistent with vertigo, will start prn meclizine.   F/u 6 weeks        Arnoldo Lenis, M.D.

## 2015-12-25 ENCOUNTER — Other Ambulatory Visit: Payer: Self-pay | Admitting: Vascular Surgery

## 2015-12-25 DIAGNOSIS — I6523 Occlusion and stenosis of bilateral carotid arteries: Secondary | ICD-10-CM

## 2016-01-26 NOTE — Progress Notes (Signed)
Cardiology Office Note   Date:  01/26/2016   ID:  Patricia Jackson, DOB Aug 28, 1940, MRN [id number]  PCP:  Wende Neighbors, MD  Cardiologist: Cloria Spring, NP   No chief complaint on file.     History of Present Illness: Patricia Jackson is a 75 y.o. female who presents for ongoing assessment and management of atypical chest pain, thought to be related to musculoskeletal etiology. There is no evidence of ACS for EKG are troponin levels. Other history includes atrial flutter, was on short-arm amiodarone for 8 weeks, on ELIQUIS,HADS VASC Score of 3 (Age, Female, HTN). She also had complaints of dizziness which were consistent with vertigo. She was started on when necessary meclizine. No further testing was recommended by Dr. Harl Bowie on that office visit.  She is here today without any new complaints. She continues to have episodes of dizziness especially when lying flat. Meclizine has not been much help to her. She is continuing to follow up with pulmonology, in [place].  Past Medical History:  Diagnosis Date  . Breast cancer ([place])   . COPD (chronic obstructive pulmonary disease) ([place])   . Hypertension     Past Surgical History:  Procedure Laterality Date  . APPENDECTOMY    . BREAST SURGERY       Current Outpatient Prescriptions  Medication Sig Dispense Refill  . albuterol (PROVENTIL) (2.5 MG/3ML) 0.083% nebulizer solution Take 3 mLs (2.5 mg total) by nebulization every 6 (six) hours as needed for wheezing or shortness of breath. 75 mL 12  . amiodarone (PACERONE) 200 MG tablet Take 1 tablet (200 mg total) by mouth daily. 30 tablet 5  . apixaban (ELIQUIS) 5 MG TABS tablet Take 1 tablet (5 mg total) by mouth 2 (two) times daily. 60 tablet 6  . buPROPion (WELLBUTRIN XL) 300 MG 24 hr tablet Take 1 tablet by mouth daily.    . cimetidine (TAGAMET) 300 MG tablet Take 1 tablet by mouth 3 (three) times daily.    Marland Kitchen diltiazem (CARDIZEM CD) 360 MG 24 hr capsule Take 1 capsule (360 mg  total) by mouth daily. 90 capsule 3  . meclizine (ANTIVERT) 25 MG tablet Take 1 tablet (25 mg total) by mouth 3 (three) times daily as needed for dizziness. 30 tablet 0  . predniSONE (DELTASONE) 20 MG tablet Take 2 tablets (40 mg total) by mouth daily. 6 tablet 0  . simvastatin (ZOCOR) 20 MG tablet Take 20 mg by mouth daily.    . SYMBICORT 160-4.5 MCG/ACT inhaler Inhale 1 puff into the lungs daily.     . traMADol (ULTRAM) 50 MG tablet Take 1 tablet by mouth every 6 (six) hours as needed for moderate pain or severe pain.      No current facility-administered medications for this visit.     Allergies:   Sulfa antibiotics    Social History:  The patient  reports that she quit smoking about 2 months ago. Her smoking use included Cigarettes. She started smoking about 50 years ago. She has a 50.00 pack-year smoking history. She has never used smokeless tobacco. She reports that she does not drink alcohol or use drugs.   Family History:  The patient's family history includes COPD in her father; Heart disease in her mother.    ROS: All other systems are reviewed and negative. Unless otherwise mentioned in H&P    PHYSICAL EXAM: VS:  There were no vitals taken for this visit. , BMI There is no height or weight on file to calculate  BMI. GEN: Well nourished, well developed, in no acute distress  HEENT: normal  Neck: no JVD, Positive, left carotid bruit, or masses Cardiac: IRRR; no murmurs, rubs, or gallops,no edema  Respiratory:  Clear to auscultation bilaterally, normal work of breathing GI: soft, nontender, nondistended, + BS MS: no deformity or atrophy  Skin: warm and dry, no rash Neuro:  Strength and sensation are intact Psych: euthymic mood, full affect   Recent Labs: 10/30/2015: ALT 44 11/01/2015: B Natriuretic Peptide 375.4; TSH 0.830 11/02/2015: Magnesium 2.4 12/03/2015: Hemoglobin 12.1; Platelets 243 12/12/2015: BUN 32; Creat 1.56; Potassium 5.0; Sodium 139    Lipid Panel     Component Value Date/Time   CHOL 75 10/31/2015 0438   TRIG 74 10/31/2015 0438   HDL 23 (L) 10/31/2015 0438   CHOLHDL 3.3 10/31/2015 0438   VLDL 15 10/31/2015 0438   LDLCALC 37 10/31/2015 0438      Wt Readings from Last 3 Encounters:  12/16/15 127 lb (57.6 kg)  12/03/15 120 lb (54.4 kg)  11/12/15 121 lb 3.2 oz (55 kg)    ASSESSMENT AND PLAN:  1.  Atrial fibrillation: Heart rate is well controlled currently. Well-controlled on amiodarone, continue ELIQUIS 5 mg twice a day, diltiazem 360 mg daily. No changes at this time.  2. Left carotid bruit: I would plan a carotid ultrasound, however she or he has one scheduled for November 7 with follow-up by [facility].  3. Chronic dyspnea: Followed by left our pulmonology with follow-up appointment scheduled on 02/16/2016.   No orders of the defined types were placed in this encounter.    Disposition:   FU with 6 months unless symptomatic  Signed, Jory Sims, NP  01/26/2016 4:28 PM    [facility] [address], [place], [place] [address] Phone: [phone]; Fax: [phone]

## 2016-01-27 ENCOUNTER — Other Ambulatory Visit: Payer: Self-pay | Admitting: Cardiology

## 2016-01-27 ENCOUNTER — Ambulatory Visit (INDEPENDENT_AMBULATORY_CARE_PROVIDER_SITE_OTHER): Payer: Medicare Other | Admitting: Adult Health

## 2016-01-27 ENCOUNTER — Encounter: Payer: Self-pay | Admitting: Adult Health

## 2016-01-27 VITALS — BP 114/60 | HR 63 | Ht 66.0 in | Wt 133.0 lb

## 2016-01-27 DIAGNOSIS — I779 Disorder of arteries and arterioles, unspecified: Secondary | ICD-10-CM

## 2016-01-27 DIAGNOSIS — I739 Peripheral vascular disease, unspecified: Principal | ICD-10-CM

## 2016-01-27 NOTE — Patient Instructions (Signed)
Your physician wants you to follow-up in: 6 months K Purcell Nails NP You will receive a reminder letter in the mail two months in advance. If you don't receive a letter, please call our office to schedule the follow-up appointment.     Your physician recommends that you continue on your current medications as directed. Please refer to the Current Medication list given to you today.   Thank you for choosing [facility] !

## 2016-01-27 NOTE — Progress Notes (Signed)
Name: Patricia Jackson    DOB: 1940-04-26  Age: 75 y.o.  MR#: [id number]       PCP:  Wende Neighbors, MD      Insurance: Payor: [employer] MEDICARE / Plan: [facility] MEDICARE / Product Type: *No Product type* /   CC:   No chief complaint on file.   VS Vitals:   01/27/16 1322  BP: 114/60  Pulse: 63  SpO2: 97%  Weight: 133 lb (60.3 kg)  Height: 5\' 6"  (1.676 m)    Weights Current Weight  01/27/16 133 lb (60.3 kg)  12/16/15 127 lb (57.6 kg)  12/03/15 120 lb (54.4 kg)    Blood Pressure  BP Readings from Last 3 Encounters:  01/27/16 114/60  12/16/15 126/70  12/03/15 134/78     Admit date:  (Not on file) Last encounter with RMR:  Visit date not found   Allergy Sulfa antibiotics  Current Outpatient Prescriptions  Medication Sig Dispense Refill  . albuterol (PROVENTIL) (2.5 MG/3ML) 0.083% nebulizer solution Take 3 mLs (2.5 mg total) by nebulization every 6 (six) hours as needed for wheezing or shortness of breath. 75 mL 12  . amiodarone (PACERONE) 200 MG tablet Take 1 tablet (200 mg total) by mouth daily. 30 tablet 5  . apixaban (ELIQUIS) 5 MG TABS tablet Take 1 tablet (5 mg total) by mouth 2 (two) times daily. 60 tablet 6  . buPROPion (WELLBUTRIN XL) 300 MG 24 hr tablet Take 1 tablet by mouth daily.    Marland Kitchen diltiazem (CARDIZEM CD) 360 MG 24 hr capsule Take 1 capsule (360 mg total) by mouth daily. 90 capsule 3  . meclizine (ANTIVERT) 25 MG tablet TAKE ONE TABLET BY MOUTH THREE TIMES DAILY AS NEEDED FOR DIZZINESS 30 tablet 0  . simvastatin (ZOCOR) 20 MG tablet Take 20 mg by mouth daily.    . SYMBICORT 160-4.5 MCG/ACT inhaler Inhale 1 puff into the lungs daily.     . traMADol (ULTRAM) 50 MG tablet Take 1 tablet by mouth every 6 (six) hours as needed for moderate pain or severe pain.      No current facility-administered medications for this visit.     Discontinued Meds:    Medications Discontinued During This Encounter  Medication Reason  . cimetidine (TAGAMET) 300 MG tablet Error   . predniSONE (DELTASONE) 20 MG tablet Error    Patient Active Problem List   Diagnosis Date Noted  . Shortness of breath   . Acute respiratory failure with hypoxia ([place])   . Hypophosphatemia   . 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[place]) 10/31/2015  . Acute exacerbation of chronic bronchitis ([place]) 10/31/2015  . COPD exacerbation ([place]) 10/31/2015  . Dyslipidemia 10/31/2015  . Elevated troponin 10/31/2015  . Atrial flutter with rapid ventricular response ([place]) 10/30/2015  . SOB (shortness of breath) 11/26/2013  . Back pain 11/26/2013  . Arm pain 11/26/2013  . Chest pain 11/26/2013  . Hypertension     LABS    Component Value Date/Time   NA 139 12/12/2015 0833   NA 135 12/03/2015 2146   NA 139 11/28/2015 1246   K 5.0 12/12/2015 0833   K 4.0 12/03/2015 2146   K 4.5 11/28/2015 1246   CL 106 12/12/2015 0833   CL 106 12/03/2015 2146   CL 108 11/28/2015 1246   CO2 23 12/12/2015 0833   CO2 23 12/03/2015 2146   CO2 19 (L) 11/28/2015 1246   GLUCOSE 97 12/12/2015 0833   GLUCOSE 126 (H) 12/03/2015 2146   GLUCOSE 145 (H) 11/28/2015  1246   BUN 32 (H) 12/12/2015 0833   BUN 38 (H) 12/03/2015 2146   BUN 31 (H) 11/28/2015 1246   CREATININE 1.56 (H) 12/12/2015 0833   CREATININE 2.09 (H) 12/03/2015 2146   CREATININE 1.41 (H) 11/28/2015 1246   CREATININE 1.24 (H) 11/14/2015 0931   CALCIUM 9.3 12/12/2015 0833   CALCIUM 9.1 12/03/2015 2146   CALCIUM 8.9 11/28/2015 1246   GFRNONAA 23 (L) 12/03/2015 2146   GFRNONAA 51 (L) 11/08/2015 0355   GFRNONAA 51 (L) 11/07/2015 0315   GFRAA 26 (L) 12/03/2015 2146   GFRAA 59 (L) 11/08/2015 0355   GFRAA 59 (L) 11/07/2015 0315   CMP     Component Value Date/Time   NA 139 12/12/2015 0833   K 5.0 12/12/2015 0833   CL 106 12/12/2015 0833   CO2 23 12/12/2015 0833   GLUCOSE 97 12/12/2015 0833   BUN 32 (H) 12/12/2015 0833   CREATININE 1.56 (H) 12/12/2015 0833   CALCIUM 9.3 12/12/2015 0833   PROT 5.4 (L) 10/30/2015 2319   ALBUMIN 2.7 (L) 11/02/2015 0304   AST 69  (H) 10/30/2015 2319   ALT 44 10/30/2015 2319   ALKPHOS 191 (H) 10/30/2015 2319   BILITOT 0.5 10/30/2015 2319   GFRNONAA 23 (L) 12/03/2015 2146   GFRAA 26 (L) 12/03/2015 2146       Component Value Date/Time   WBC 5.9 12/03/2015 2146   WBC 14.1 (H) 11/07/2015 0315   WBC 10.9 (H) 11/02/2015 0304   HGB 12.1 12/03/2015 2146   HGB 11.5 (L) 11/07/2015 0315   HGB 11.1 (L) 11/02/2015 0304   HCT 36.0 12/03/2015 2146   HCT 35.2 (L) 11/07/2015 0315   HCT 32.8 (L) 11/02/2015 0304   MCV 90.2 12/03/2015 2146   MCV 88.2 11/07/2015 0315   MCV 84.8 11/02/2015 0304    Lipid Panel     Component Value Date/Time   CHOL 75 10/31/2015 0438   TRIG 74 10/31/2015 0438   HDL 23 (L) 10/31/2015 0438   CHOLHDL 3.3 10/31/2015 0438   VLDL 15 10/31/2015 0438   LDLCALC 37 10/31/2015 0438    ABG    Component Value Date/Time   TCO2 17 10/30/2015 1759     Lab Results  Component Value Date   TSH 0.830 11/01/2015   BNP (last 3 results)  Recent Labs  11/01/15 2330  BNP 375.4*    ProBNP (last 3 results) No results for input(s): PROBNP in the last 8760 hours.  Cardiac Panel (last 3 results) No results for input(s): CKTOTAL, CKMB, TROPONINI, RELINDX in the last 72 hours.  Iron/TIBC/Ferritin/ %Sat No results found for: IRON, TIBC, FERRITIN, IRONPCTSAT   EKG Orders placed or performed during the hospital encounter of 12/03/15  . ED EKG  . ED EKG  . EKG 12-Lead  . EKG 12-Lead  . ED EKG  . ED EKG  . EKG 12-Lead  . EKG 12-Lead  . EKG     Prior Assessment and Plan Problem List as of 01/27/2016 Reviewed: 12/18/2015  9:29 PM by Carlyle Dolly, MD     Cardiovascular and Mediastinum   Hypertension   Atrial flutter with rapid ventricular response (HCC)     Respiratory   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HCC)   Acute exacerbation of chronic bronchitis (HCC)   COPD exacerbation (HCC)   Acute respiratory failure with hypoxia (HCC)     Other   SOB (shortness of breath)   Back pain   Arm pain    Chest pain  Dyslipidemia   Elevated troponin   Hypophosphatemia   Shortness of breath       Imaging: No results found.

## 2016-02-06 ENCOUNTER — Encounter: Payer: Self-pay | Admitting: Vascular Surgery

## 2016-02-10 ENCOUNTER — Ambulatory Visit (INDEPENDENT_AMBULATORY_CARE_PROVIDER_SITE_OTHER): Payer: Medicare Other | Admitting: Vascular Surgery

## 2016-02-10 ENCOUNTER — Encounter: Payer: Self-pay | Admitting: Vascular Surgery

## 2016-02-10 ENCOUNTER — Ambulatory Visit (HOSPITAL_COMMUNITY)
Admission: RE | Admit: 2016-02-10 | Discharge: 2016-02-10 | Disposition: A | Discharge status: Home / Self Care | Payer: Medicare Other | Source: Ambulatory Visit | Attending: Vascular Surgery | Admitting: Vascular Surgery

## 2016-02-10 VITALS — BP 126/68 | HR 60 | Temp 97.1°F | Resp 28 | Ht 66.0 in | Wt 135.0 lb

## 2016-02-10 DIAGNOSIS — I6523 Occlusion and stenosis of bilateral carotid arteries: Secondary | ICD-10-CM

## 2016-02-10 DIAGNOSIS — I6522 Occlusion and stenosis of left carotid artery: Secondary | ICD-10-CM | POA: Diagnosis not present

## 2016-02-10 LAB — VAS US CAROTID
LEFT ECA DIAS: 11 cm/s
Left CCA dist dias: -19 cm/s
Left CCA dist sys: -99 cm/s
Left CCA prox dias: 16 cm/s
Left CCA prox sys: 83 cm/s
Left ICA dist dias: -23 cm/s
Left ICA dist sys: -86 cm/s
Left ICA prox dias: 48 cm/s
Left ICA prox sys: 192 cm/s
RIGHT CCA MID DIAS: 14 cm/s
RIGHT ECA DIAS: -11 cm/s
Right CCA prox dias: 15 cm/s
Right CCA prox sys: 108 cm/s
Right cca dist sys: -64 cm/s

## 2016-02-10 NOTE — Progress Notes (Signed)
Vascular and Vein Specialist of [facility]  Patient name: Patricia Jackson MRN: [id number] DOB: 1940/04/30 Sex: female  REASON FOR VISIT: Hollow up asymptomatic left carotid stenosis  HPI: Patricia Jackson is a 75 y.o. female here today for follow-up. She has had no focal neurologic deficits. She does have continued COPD exacerbations. Fortunately she did quit smoking in July 2017. Does report several unusual upper extremity symptoms. She reports worsening tremor in both upper extremities. Also reports strange occurrence of pain extending from her shoulders down into her extremities. This can occur in one side or the other or bilaterally. No aggravating issues. She denies any focal neurologic deficits. Specifically no amaurosis fugax, transient ischemic attack or stroke  Past Medical History:  Diagnosis Date  . Breast cancer ([place])   . COPD (chronic obstructive pulmonary disease) ([place])   . Hypertension     Family History  Problem Relation Age of Onset  . Heart disease Mother   . COPD Father     SOCIAL HISTORY: Social History  Substance Use Topics  . Smoking status: Former Smoker    Packs/day: 1.00    Years: 50.00    Types: Cigarettes    Start date: 12/15/1965    Quit date: 10/29/2015  . Smokeless tobacco: Never Used  . Alcohol use No    Allergies  Allergen Reactions  . Sulfa Antibiotics Hives    Current Outpatient Prescriptions  Medication Sig Dispense Refill  . albuterol (PROVENTIL) (2.5 MG/3ML) 0.083% nebulizer solution Take 3 mLs (2.5 mg total) by nebulization every 6 (six) hours as needed for wheezing or shortness of breath. 75 mL 12  . amiodarone (PACERONE) 200 MG tablet Take 1 tablet (200 mg total) by mouth daily. 30 tablet 5  . apixaban (ELIQUIS) 5 MG TABS tablet Take 1 tablet (5 mg total) by mouth 2 (two) times daily. 60 tablet 6  . buPROPion (WELLBUTRIN XL) 300 MG 24 hr tablet Take 1 tablet by mouth daily.    Marland Kitchen diltiazem  (CARDIZEM CD) 360 MG 24 hr capsule Take 1 capsule (360 mg total) by mouth daily. 90 capsule 3  . meclizine (ANTIVERT) 25 MG tablet TAKE ONE TABLET BY MOUTH THREE TIMES DAILY AS NEEDED FOR DIZZINESS 30 tablet 0  . simvastatin (ZOCOR) 20 MG tablet Take 20 mg by mouth daily.    . SYMBICORT 160-4.5 MCG/ACT inhaler Inhale 1 puff into the lungs daily.     . traMADol (ULTRAM) 50 MG tablet Take 1 tablet by mouth every 6 (six) hours as needed for moderate pain or severe pain.      No current facility-administered medications for this visit.     REVIEW OF SYSTEMS:  [X]  denotes positive finding, [ ]  denotes negative finding Cardiac  Comments:  Chest pain or chest pressure:    Shortness of breath upon exertion: x   Short of breath when lying flat:    Irregular heart rhythm:        Vascular    Pain in calf, thigh, or hip brought on by ambulation:    Pain in feet at night that wakes you up from your sleep:     Blood clot in your veins:    Leg swelling:           PHYSICAL EXAM: Vitals:   02/10/16 1337  BP: 126/68  Pulse: 60  Resp: (!) 28  Temp: 97.1 F (36.2 C)  TempSrc: Oral  SpO2: 97%  Weight: 135 lb (61.2 kg)  Height: 5' 6"  (1.676  m)    GENERAL: The patient is a well-nourished female, in no acute distress. The vital signs are documented above. CARDIOVASCULAR: 2+ radial pulses bilaterally. Carotid bruits without bruits bilaterally PULMONARY: There is good air exchange  MUSCULOSKELETAL: There are no major deformities or cyanosis. NEUROLOGIC: No focal weakness or paresthesias are detected. SKIN: There are no ulcers or rashes noted. PSYCHIATRIC: The patient has a normal affect.  DATA:  Carotid duplex today shows no change with no significant right carotid stenosis and 40-59% left carotid stenosis.  MEDICAL ISSUES: Discussed symptoms of carotid disease with the patient. She is right-handed. She will present emergency room should she have any new deficits. Unclear as to the etiology  of her tremor and upper arm discomfort but do not feel this is related to arterial pathology. She will discuss this with Dr. Nevada Crane. We'll see her again in one year for continued duplex follow-up    Rosetta Posner, MD [facility] Vascular and Vein Specialists of [facility] Tel [phone] Pager [phone]

## 2016-02-16 ENCOUNTER — Encounter: Payer: Self-pay | Admitting: Pulmonary Disease

## 2016-02-16 ENCOUNTER — Ambulatory Visit (INDEPENDENT_AMBULATORY_CARE_PROVIDER_SITE_OTHER): Payer: Medicare Other | Admitting: Pulmonary Disease

## 2016-02-16 VITALS — BP 124/62 | HR 66 | Ht 66.0 in | Wt 135.4 lb

## 2016-02-16 DIAGNOSIS — J9601 Acute respiratory failure with hypoxia: Secondary | ICD-10-CM | POA: Diagnosis not present

## 2016-02-16 DIAGNOSIS — J441 Chronic obstructive pulmonary disease with (acute) exacerbation: Secondary | ICD-10-CM | POA: Diagnosis not present

## 2016-02-16 MED ORDER — SYMBICORT 160-4.5 MCG/ACT IN AERO: 1.0000 | INHALATION_SPRAY | Freq: Every day | RESPIRATORY_TRACT | 5 refills | Status: DC

## 2016-02-16 NOTE — Progress Notes (Signed)
Patricia Jackson    [id number]    01-27-1941  Primary Care Physician:Zack Nevada Crane, MD  Referring Physician: Celene Squibb, MD [address] [place], [place] [address]  Chief complaint:  Follow-up for moderate COPD  HPI: [name] is a 75 year old with past medical history of atrial fibrillation, COPD. She was hospitalized and August of this year with atypical chest pain, COPD exacerbation. She was treated with prednisone taper. She was also noted to be in atrial flutter. She was initially treated with a Cardizem drip and then transitioned to amiodarone. The plan is to use this for short-term of about 8 weeks. She is followed by cardiology.  She feels improved since admission. She still has dyspnea on exertion, chronic cough with white mucus production. She denies any fevers, chills. She still suffers from atypical chest pain chest pain, palpitation. She is now off the prednisone and continues on the Symbicort but is taking it only once daily.   Outpatient Encounter Prescriptions as of 02/16/2016  Medication Sig  . albuterol (PROVENTIL) (2.5 MG/3ML) 0.083% nebulizer solution Take 3 mLs (2.5 mg total) by nebulization every 6 (six) hours as needed for wheezing or shortness of breath.  Marland Kitchen amiodarone (PACERONE) 200 MG tablet Take 1 tablet (200 mg total) by mouth daily.  Marland Kitchen apixaban (ELIQUIS) 5 MG TABS tablet Take 1 tablet (5 mg total) by mouth 2 (two) times daily.  Marland Kitchen buPROPion (WELLBUTRIN XL) 300 MG 24 hr tablet Take 1 tablet by mouth daily.  Marland Kitchen diltiazem (CARDIZEM CD) 360 MG 24 hr capsule Take 1 capsule (360 mg total) by mouth daily.  Marland Kitchen levocetirizine (XYZAL) 5 MG tablet Take 5 mg by mouth every evening.  . simvastatin (ZOCOR) 20 MG tablet Take 20 mg by mouth daily.  . SYMBICORT 160-4.5 MCG/ACT inhaler Inhale 1 puff into the lungs daily.   . meclizine (ANTIVERT) 25 MG tablet TAKE ONE TABLET BY MOUTH THREE TIMES DAILY AS NEEDED FOR DIZZINESS (Patient not taking: Reported on 02/16/2016)   . traMADol (ULTRAM) 50 MG tablet Take 1 tablet by mouth every 6 (six) hours as needed for moderate pain or severe pain.    No facility-administered encounter medications on file as of 02/16/2016.     Allergies as of 02/16/2016 - Review Complete 02/16/2016  Allergen Reaction Noted  . Sulfa antibiotics Hives 08/02/2013    Past Medical History:  Diagnosis Date  . Breast cancer ([place])   . COPD (chronic obstructive pulmonary disease) ([place])   . Hypertension     Past Surgical History:  Procedure Laterality Date  . APPENDECTOMY    . BREAST SURGERY      Family History  Problem Relation Age of Onset  . Heart disease Mother   . COPD Father     Social History   Social History  . Marital status: Divorced    Spouse name: N/A  . Number of children: N/A  . Years of education: N/A   Occupational History  . Not on file.   Social History Main Topics  . Smoking status: Former Smoker    Packs/day: 1.00    Years: 50.00    Types: Cigarettes    Start date: 12/15/1965    Quit date: 10/29/2015  . Smokeless tobacco: Never Used  . Alcohol use No  . Drug use: No  . Sexual activity: Not Currently   Other Topics Concern  . Not on file   Social History Narrative  . No narrative on file  Review of systems: Review of Systems  Constitutional: Negative for fever and chills.  HENT: Negative.   Eyes: Negative for blurred vision.  Respiratory: as per HPI  Cardiovascular: Negative for chest pain and palpitations.  Gastrointestinal: Negative for vomiting, diarrhea, blood per rectum. Genitourinary: Negative for dysuria, urgency, frequency and hematuria.  Musculoskeletal: Negative for myalgias, back pain and joint pain.  Skin: Negative for itching and rash.  Neurological: Negative for dizziness, tremors, focal weakness, seizures and loss of consciousness.  Endo/Heme/Allergies: Negative for environmental allergies.  Psychiatric/Behavioral: Negative for depression, suicidal ideas and  hallucinations.  All other systems reviewed and are negative.   Physical Exam: Blood pressure 124/62, pulse 66, height 5' 6"  (1.676 m), weight 61.4 kg (135 lb 6.4 oz), SpO2 99 %. Gen:      No acute distress HEENT:  EOMI, sclera anicteric Neck:     No masses; no thyromegaly Lungs:    Clear to auscultation bilaterally; normal respiratory effort CV:         Regular rate and rhythm; no murmurs Abd:      + bowel sounds; soft, non-tender; no palpable masses, no distension Ext:    No edema; adequate peripheral perfusion Skin:      Warm and dry; no rash Neuro: alert and oriented x 3 Psych: normal mood and affect  Data Reviewed: PFT 11/21/13: FVC 2.54 L (75%)  FEV1 1.68 L (66%)  FEV1/FVC 0.66  FEF 25-75 0.83 L (40%) no bronchodilator response  TLC 5.65 L (102%)  RV 138%  DLCO uncorrected 40% Moderate obstructive defect with severe DLCOI impairment.  PORT CXR 7/28:Increased bilateral interstitial markings. No obvious pleural effusion. CT chest 11/26/13: Diffuse emphysematous changes, mild pulmonary fibrosis. All images reviewed  TTE 7/28:LV without regional wall motion abnormalities. LA &RA normal in size. RV normal in size and function. Pulmonary artery systolic pressure 41 mmHg. No aortic stenosis or regurgitation. No mitral stenosis but mild to moderate regurgitation directed centrally. Trivial tricuspid regurgitation. Poorly visualized pulmonic valve. No pericardial effusion.  Assessment:  Moderate COPD Slow improvement post admission. She is not wheezing in office today. We'll continue the Symbicort She will need to increase dose to twice daily. She'll require repeat PFTs since the last test was in 2015.  Past Smoker She's been off cigarettes since admission 2 months ago. I congratulated her and encouraged her to stay smoke free.  Plan/Recommendations: - Continue symbicort. Increase dose to twice daily. - Return in 3 months with PFTs  Marshell Garfinkel MD [place] [facility] Pager [phone] 02/16/2016, 9:12 AM  CC: Celene Squibb, MD

## 2016-02-16 NOTE — Patient Instructions (Signed)
Continue using your Symbicort. Return to clinic in 3 months with pulmonary function test.

## 2016-02-25 NOTE — Addendum Note (Signed)
Addended by: Lianne Cure A on: 02/25/2016 09:57 AM   Modules accepted: Orders

## 2016-03-11 DIAGNOSIS — Z720 Tobacco use: Secondary | ICD-10-CM | POA: Diagnosis not present

## 2016-03-11 DIAGNOSIS — J449 Chronic obstructive pulmonary disease, unspecified: Secondary | ICD-10-CM | POA: Diagnosis not present

## 2016-03-12 ENCOUNTER — Emergency Department (HOSPITAL_COMMUNITY): Payer: Medicare Other

## 2016-03-12 ENCOUNTER — Inpatient Hospital Stay (HOSPITAL_COMMUNITY)
Admission: EM | Admit: 2016-03-12 | Discharge: 2016-03-16 | DRG: 247 | Disposition: A | Discharge status: Home / Self Care | Payer: Medicare Other | Attending: Cardiovascular Disease | Admitting: Cardiovascular Disease

## 2016-03-12 ENCOUNTER — Encounter (HOSPITAL_COMMUNITY): Payer: Self-pay | Admitting: Cardiology

## 2016-03-12 DIAGNOSIS — Z8249 Family history of ischemic heart disease and other diseases of the circulatory system: Secondary | ICD-10-CM

## 2016-03-12 DIAGNOSIS — E785 Hyperlipidemia, unspecified: Secondary | ICD-10-CM | POA: Diagnosis not present

## 2016-03-12 DIAGNOSIS — I4892 Unspecified atrial flutter: Secondary | ICD-10-CM | POA: Diagnosis not present

## 2016-03-12 DIAGNOSIS — Z882 Allergy status to sulfonamides status: Secondary | ICD-10-CM

## 2016-03-12 DIAGNOSIS — Z79899 Other long term (current) drug therapy: Secondary | ICD-10-CM

## 2016-03-12 DIAGNOSIS — I249 Acute ischemic heart disease, unspecified: Secondary | ICD-10-CM | POA: Diagnosis present

## 2016-03-12 DIAGNOSIS — Z955 Presence of coronary angioplasty implant and graft: Secondary | ICD-10-CM

## 2016-03-12 DIAGNOSIS — J441 Chronic obstructive pulmonary disease with (acute) exacerbation: Secondary | ICD-10-CM | POA: Diagnosis not present

## 2016-03-12 DIAGNOSIS — M549 Dorsalgia, unspecified: Secondary | ICD-10-CM | POA: Diagnosis not present

## 2016-03-12 DIAGNOSIS — Z95820 Peripheral vascular angioplasty status with implants and grafts: Secondary | ICD-10-CM

## 2016-03-12 DIAGNOSIS — I129 Hypertensive chronic kidney disease with stage 1 through stage 4 chronic kidney disease, or unspecified chronic kidney disease: Secondary | ICD-10-CM | POA: Diagnosis not present

## 2016-03-12 DIAGNOSIS — I214 Non-ST elevation (NSTEMI) myocardial infarction: Secondary | ICD-10-CM | POA: Diagnosis not present

## 2016-03-12 DIAGNOSIS — J8410 Pulmonary fibrosis, unspecified: Secondary | ICD-10-CM | POA: Diagnosis not present

## 2016-03-12 DIAGNOSIS — E782 Mixed hyperlipidemia: Secondary | ICD-10-CM | POA: Diagnosis present

## 2016-03-12 DIAGNOSIS — Z7951 Long term (current) use of inhaled steroids: Secondary | ICD-10-CM

## 2016-03-12 DIAGNOSIS — N183 Chronic kidney disease, stage 3 (moderate): Secondary | ICD-10-CM | POA: Diagnosis not present

## 2016-03-12 DIAGNOSIS — Z825 Family history of asthma and other chronic lower respiratory diseases: Secondary | ICD-10-CM

## 2016-03-12 DIAGNOSIS — Z7901 Long term (current) use of anticoagulants: Secondary | ICD-10-CM | POA: Diagnosis not present

## 2016-03-12 DIAGNOSIS — Z87891 Personal history of nicotine dependence: Secondary | ICD-10-CM

## 2016-03-12 DIAGNOSIS — I2510 Atherosclerotic heart disease of native coronary artery without angina pectoris: Secondary | ICD-10-CM | POA: Diagnosis not present

## 2016-03-12 DIAGNOSIS — I25110 Atherosclerotic heart disease of native coronary artery with unstable angina pectoris: Secondary | ICD-10-CM | POA: Diagnosis present

## 2016-03-12 DIAGNOSIS — I10 Essential (primary) hypertension: Secondary | ICD-10-CM | POA: Diagnosis not present

## 2016-03-12 DIAGNOSIS — Z853 Personal history of malignant neoplasm of breast: Secondary | ICD-10-CM

## 2016-03-12 HISTORY — DX: Atherosclerotic heart disease of native coronary artery without angina pectoris: I25.10

## 2016-03-12 HISTORY — DX: Peripheral vascular angioplasty status with implants and grafts: Z95.820

## 2016-03-12 LAB — COMPREHENSIVE METABOLIC PANEL
ALT: 28 U/L (ref 14–54)
AST: 38 U/L (ref 15–41)
Albumin: 3.7 g/dL (ref 3.5–5.0)
Alkaline Phosphatase: 83 U/L (ref 38–126)
Anion gap: 10 (ref 5–15)
BUN: 23 mg/dL — ABNORMAL HIGH (ref 6–20)
CO2: 19 mmol/L — ABNORMAL LOW (ref 22–32)
Calcium: 9.3 mg/dL (ref 8.9–10.3)
Chloride: 108 mmol/L (ref 101–111)
Creatinine, Ser: 1.3 mg/dL — ABNORMAL HIGH (ref 0.44–1.00)
GFR calc Af Amer: 46 mL/min — ABNORMAL LOW (ref >60)
GFR calc non Af Amer: 40 mL/min — ABNORMAL LOW (ref >60)
Glucose, Bld: 177 mg/dL — ABNORMAL HIGH (ref 65–99)
Potassium: 3.8 mmol/L (ref 3.5–5.1)
Sodium: 137 mmol/L (ref 135–145)
Total Bilirubin: 0.4 mg/dL (ref 0.3–1.2)
Total Protein: 7.4 g/dL (ref 6.5–8.1)

## 2016-03-12 LAB — TROPONIN I
Troponin I: 0.19 ng/mL (ref <0.03)
Troponin I: 0.31 ng/mL (ref <0.03)
Troponin I: 0.76 ng/mL (ref <0.03)
Troponin I: 0.91 ng/mL (ref <0.03)

## 2016-03-12 LAB — PROTIME-INR
INR: 1.41
Prothrombin Time: 17.4 seconds — ABNORMAL HIGH (ref 11.4–15.2)

## 2016-03-12 LAB — CBC WITH DIFFERENTIAL/PLATELET
Basophils Absolute: 0 10*3/uL (ref 0.0–0.1)
Basophils Relative: 0 %
Eosinophils Absolute: 0 10*3/uL (ref 0.0–0.7)
Eosinophils Relative: 0 %
HCT: 36.9 % (ref 36.0–46.0)
Hemoglobin: 12.1 g/dL (ref 12.0–15.0)
Lymphocytes Relative: 11 %
Lymphs Abs: 1.2 10*3/uL (ref 0.7–4.0)
MCH: 30.3 pg (ref 26.0–34.0)
MCHC: 32.8 g/dL (ref 30.0–36.0)
MCV: 92.3 fL (ref 78.0–100.0)
Monocytes Absolute: 0.8 10*3/uL (ref 0.1–1.0)
Monocytes Relative: 8 %
Neutro Abs: 8.3 10*3/uL — ABNORMAL HIGH (ref 1.7–7.7)
Neutrophils Relative %: 81 %
Platelets: 261 10*3/uL (ref 150–400)
RBC: 4 MIL/uL (ref 3.87–5.11)
RDW: 13.3 % (ref 11.5–15.5)
WBC: 10.3 10*3/uL (ref 4.0–10.5)

## 2016-03-12 LAB — APTT: aPTT: 45 seconds — ABNORMAL HIGH (ref 24–36)

## 2016-03-12 LAB — MRSA PCR SCREENING: MRSA by PCR: NEGATIVE

## 2016-03-12 MED ORDER — BUPROPION HCL ER (XL) 300 MG PO TB24
300.0000 mg | ORAL_TABLET | Freq: Every day | ORAL | Status: DC
  Administered 2016-03-13 – 2016-03-16 (×4): 300 mg via ORAL
  Filled 2016-03-12: qty 2
  Filled 2016-03-12: qty 1
  Filled 2016-03-12: qty 2
  Filled 2016-03-12: qty 1

## 2016-03-12 MED ORDER — AMIODARONE HCL 200 MG PO TABS
200.0000 mg | ORAL_TABLET | Freq: Every day | ORAL | Status: DC
  Administered 2016-03-13 – 2016-03-16 (×4): 200 mg via ORAL
  Filled 2016-03-12 (×4): qty 1

## 2016-03-12 MED ORDER — MORPHINE SULFATE (PF) 4 MG/ML IV SOLN
4.0000 mg | Freq: Once | INTRAVENOUS | Status: AC
  Administered 2016-03-12: 4 mg via INTRAVENOUS
  Filled 2016-03-12: qty 1

## 2016-03-12 MED ORDER — PREDNISONE 5 MG PO TABS
10.0000 mg | ORAL_TABLET | Freq: Every day | ORAL | Status: DC
  Administered 2016-03-16: 05:00:00 10 mg via ORAL
  Filled 2016-03-12 (×3): qty 2

## 2016-03-12 MED ORDER — PREDNISONE 20 MG PO TABS: 20.0000 mg | ORAL_TABLET | Freq: Every day | ORAL | Status: DC

## 2016-03-12 MED ORDER — PREDNISONE 20 MG PO TABS
30.0000 mg | ORAL_TABLET | Freq: Once | ORAL | Status: AC
  Administered 2016-03-14: 30 mg via ORAL
  Filled 2016-03-12: qty 1

## 2016-03-12 MED ORDER — PREDNISONE 20 MG PO TABS: 40.0000 mg | ORAL_TABLET | Freq: Every day | ORAL | Status: DC

## 2016-03-12 MED ORDER — ASPIRIN EC 81 MG PO TBEC
81.0000 mg | DELAYED_RELEASE_TABLET | Freq: Every day | ORAL | Status: DC
  Administered 2016-03-13 – 2016-03-14 (×2): 81 mg via ORAL
  Filled 2016-03-12 (×2): qty 1

## 2016-03-12 MED ORDER — HEPARIN BOLUS VIA INFUSION
3500.0000 [IU] | Freq: Once | INTRAVENOUS | Status: AC
  Administered 2016-03-12: 3500 [IU] via INTRAVENOUS
  Filled 2016-03-12: qty 3500

## 2016-03-12 MED ORDER — SIMVASTATIN 20 MG PO TABS
20.0000 mg | ORAL_TABLET | Freq: Every day | ORAL | Status: DC
  Administered 2016-03-12 – 2016-03-15 (×4): 20 mg via ORAL
  Filled 2016-03-12 (×4): qty 1

## 2016-03-12 MED ORDER — ONDANSETRON HCL 4 MG/2ML IJ SOLN
4.0000 mg | Freq: Four times a day (QID) | INTRAMUSCULAR | Status: DC | PRN
  Filled 2016-03-12: qty 2

## 2016-03-12 MED ORDER — ASPIRIN 81 MG PO CHEW
324.0000 mg | CHEWABLE_TABLET | Freq: Once | ORAL | Status: AC
  Administered 2016-03-12: 324 mg via ORAL
  Filled 2016-03-12: qty 4

## 2016-03-12 MED ORDER — ACETAMINOPHEN 325 MG PO TABS
650.0000 mg | ORAL_TABLET | ORAL | Status: DC | PRN
  Administered 2016-03-13 – 2016-03-14 (×6): 650 mg via ORAL
  Filled 2016-03-12 (×6): qty 2

## 2016-03-12 MED ORDER — DILTIAZEM HCL ER COATED BEADS 120 MG PO CP24
360.0000 mg | ORAL_CAPSULE | Freq: Every day | ORAL | Status: DC
  Administered 2016-03-13: 360 mg via ORAL
  Filled 2016-03-12: qty 3

## 2016-03-12 MED ORDER — DOXYCYCLINE HYCLATE 100 MG PO TABS
100.0000 mg | ORAL_TABLET | Freq: Two times a day (BID) | ORAL | Status: DC
  Administered 2016-03-12 – 2016-03-16 (×8): 100 mg via ORAL
  Filled 2016-03-12 (×8): qty 1

## 2016-03-12 MED ORDER — ALBUTEROL SULFATE (2.5 MG/3ML) 0.083% IN NEBU: 2.5000 mg | INHALATION_SOLUTION | Freq: Four times a day (QID) | RESPIRATORY_TRACT | Status: DC | PRN

## 2016-03-12 MED ORDER — HEPARIN (PORCINE) IN NACL 100-0.45 UNIT/ML-% IJ SOLN
800.0000 [IU]/h | INTRAMUSCULAR | Status: DC
  Administered 2016-03-12 – 2016-03-15 (×3): 800 [IU]/h via INTRAVENOUS
  Filled 2016-03-12 (×3): qty 250

## 2016-03-12 MED ORDER — PREDNISONE 10 MG PO TABS: 30.0000 mg | ORAL_TABLET | Freq: Every day | ORAL | Status: DC

## 2016-03-12 MED ORDER — PREDNISONE 5 MG PO TABS
50.0000 mg | ORAL_TABLET | ORAL | Status: DC
  Filled 2016-03-12: qty 2

## 2016-03-12 MED ORDER — NITROGLYCERIN 0.4 MG SL SUBL
0.4000 mg | SUBLINGUAL_TABLET | SUBLINGUAL | Status: DC | PRN
  Administered 2016-03-13: 0.4 mg via SUBLINGUAL

## 2016-03-12 MED ORDER — MOMETASONE FURO-FORMOTEROL FUM 200-5 MCG/ACT IN AERO
2.0000 | INHALATION_SPRAY | Freq: Two times a day (BID) | RESPIRATORY_TRACT | Status: DC
  Administered 2016-03-13 – 2016-03-16 (×7): 2 via RESPIRATORY_TRACT
  Filled 2016-03-12 (×2): qty 8.8

## 2016-03-12 MED ORDER — PREDNISONE 20 MG PO TABS
20.0000 mg | ORAL_TABLET | Freq: Once | ORAL | Status: AC
  Administered 2016-03-15: 16:00:00 20 mg via ORAL

## 2016-03-12 MED ORDER — NITROGLYCERIN 0.4 MG SL SUBL
0.4000 mg | SUBLINGUAL_TABLET | Freq: Once | SUBLINGUAL | Status: DC
  Filled 2016-03-12 (×2): qty 1

## 2016-03-12 MED ORDER — PREDNISONE 20 MG PO TABS
40.0000 mg | ORAL_TABLET | Freq: Once | ORAL | Status: AC
  Administered 2016-03-13: 40 mg via ORAL
  Filled 2016-03-12: qty 2

## 2016-03-12 NOTE — Progress Notes (Signed)
ANTICOAGULATION CONSULT NOTE - Initial Consult  Pharmacy Consult for Heparin Indication: NSTEMI  Allergies  Allergen Reactions  . Sulfa Antibiotics Hives    Patient Measurements: Height: 5\' 6"  (167.6 cm) Weight: 125 lb (56.7 kg) IBW/kg (Calculated) : 59.3 Heparin Dosing Weight: 59.3 kg  Vital Signs: Temp: 97.9 F (36.6 C) (12/08 1300) Temp Source: Oral (12/08 1300) BP: 138/73 (12/08 1130) Pulse Rate: 73 (12/08 1130)  Labs:  Recent Labs  03/12/16 0830 03/12/16 1131  HGB 12.1  --   HCT 36.9  --   PLT 261  --   APTT 45*  --   LABPROT 17.4*  --   INR 1.41  --   CREATININE 1.30*  --   TROPONINI 0.19* 0.31*    Estimated Creatinine Clearance: 34.5 mL/min (by C-G formula based on SCr of 1.3 mg/dL (H)).   Medical History: Past Medical History:  Diagnosis Date  . Breast cancer ([place])   . COPD (chronic obstructive pulmonary disease) ([place])   . Hypertension     Medications:  Prescriptions Prior to Admission  Medication Sig Dispense Refill Last Dose  . amiodarone (PACERONE) 200 MG tablet Take 1 tablet (200 mg total) by mouth daily. 30 tablet 5 03/12/2016 at Unknown time  . apixaban (ELIQUIS) 5 MG TABS tablet Take 1 tablet (5 mg total) by mouth 2 (two) times daily. 60 tablet 6 03/12/2016 at 0615  . buPROPion (WELLBUTRIN XL) 300 MG 24 hr tablet Take 1 tablet by mouth daily.   03/12/2016 at Unknown time  . diltiazem (CARDIZEM CD) 360 MG 24 hr capsule Take 1 capsule (360 mg total) by mouth daily. 90 capsule 3 03/12/2016 at Unknown time  . doxycycline (VIBRA-TABS) 100 MG tablet Take 100 mg by mouth 2 (two) times daily. For 14 days   03/12/2016 at Unknown time  . levocetirizine (XYZAL) 5 MG tablet Take 5 mg by mouth every evening.   03/11/2016 at Unknown time  . predniSONE (DELTASONE) 10 MG tablet Take 50 mg by mouth as directed. 6 day pak (started on 03/11/13 with 6 tabs,then 5 tabs today 03/12/16,then 4 tabs,then 3 tabs,then 2 tabs,then 1 tab then stop)   03/12/2016 at Unknown time  .  simvastatin (ZOCOR) 20 MG tablet Take 20 mg by mouth daily at 6 PM.    03/11/2016 at Unknown time  . SYMBICORT 160-4.5 MCG/ACT inhaler Inhale 1 puff into the lungs daily. 1 Inhaler 5 03/12/2016 at Unknown time  . albuterol (PROVENTIL) (2.5 MG/3ML) 0.083% nebulizer solution Take 3 mLs (2.5 mg total) by nebulization every 6 (six) hours as needed for wheezing or shortness of breath. 75 mL 12 unknown  . meclizine (ANTIVERT) 25 MG tablet TAKE ONE TABLET BY MOUTH THREE TIMES DAILY AS NEEDED FOR DIZZINESS (Patient not taking: Reported on 02/16/2016) 30 tablet 0     Assessment:  Neck, back pain radiating down both arms. Troponins 0.19, 0.31. +EKG changes. Takes Eliquis for aflutter. Last dose this AM 12/8 between 0600-0630. Due to low INR (normally very elevated on DOAC), I asked patient about Eliquis dosing and she said she did not know until she went to pick up her new prescription this week that she was supposed to be taking it TWICE a day instead of once a day. -Start IV heparin for NSTEMI  Baseline CBC WNL. LFT's WNL, INR 1.41, aPTT 45 slightly elevated  PMH: HTN, aflutter, tobacco use, COPD, breast cancer.  Goal of Therapy:  Heparin level 0.3-0.7 units/ml Monitor platelets by anticoagulation protocol: Yes  Plan:  Heparin bolus 3500 units at 1800 Heparin infusion 800 units/hr Check aptt AND heparin level 8 hrs later Daily HL and CBC    Mica Ramdass S. Alford [place], PharmD, BCPS Clinical Staff Pharmacist Pager [phone]  Eilene Ghazi Stillinger 03/12/2016,2:52 PM

## 2016-03-12 NOTE — ED Triage Notes (Signed)
Pain across back of neck and down both arms times 2 hours.  Describes as pressure type pain.

## 2016-03-12 NOTE — ED Notes (Signed)
SL Nitro to be given by [employer] 

## 2016-03-12 NOTE — ED Notes (Signed)
Troponin 0.19, EDP notified.

## 2016-03-12 NOTE — H&P (Signed)
History & Physical    Patient ID: Patricia Jackson MRN: [id number], DOB/AGE: Apr 07, 1940   Admit date: 03/12/2016   Primary Physician: Wende Neighbors, MD Primary Cardiologist: Dr. Harl Bowie  Patient Profile    75 yo female with PMH of HTN, Atrial flutter, tobacco use, COPD and Breast Ca who presented to [facility] with upper back pain between her shoulder blades and radiation into her arms. Noted to have EKG changes and positive trop.   Past Medical History    Past Medical History:  Diagnosis Date  . Breast cancer ([place])   . COPD (chronic obstructive pulmonary disease) ([place])   . Hypertension     Past Surgical History:  Procedure Laterality Date  . APPENDECTOMY    . BREAST SURGERY       Allergies  Allergies  Allergen Reactions  . Sulfa Antibiotics Hives    History of Present Illness    75 yo female with PMH of HTN, Atrial flutter, tobacco use, COPD and breast CA. She was seen by cardiology back in 8/17 when she was diagnosed with atrial flutter. She was followed up in the office by Dr. Harl Bowie after her admission for atypical chest pain. There was no evidence of ACS by her EKG or enzymes. Did continue to report this atypical pain, described as back pain with radiation into bilateral arms. It was felt that the time that the pain was not representative of ACS, and she was to follow up.    Echo 7/17 showed EF of 55-60% with no WMA.   States she has continued to have this intermittent pain, but it has not worsened. Reports she was woken from sleep this morning around 5am with this same upper back pain with radiation into her arms. Attempted to use a heat pack, but no relief. She presented to [facility] ED for further evaluation.   In the ED her initial EKG showed new inferior lateral ST depression not noted on previous EKGs. She was given morphine and SL nitro with relief of pain. Follow up EKG showed improvement of ST depression, until mostly resolved. Labs noted stable electrolytes,  Hgb 12.1, trop 0.19>>0.31. Cr 1.3. Cardiology was called for admission and [facility].    Home Medications    Prior to Admission medications   Medication Sig Start Date End Date Taking? Authorizing Provider  amiodarone (PACERONE) 200 MG tablet Take 1 tablet (200 mg total) by mouth daily. 11/08/15  Yes Almyra Deforest, PA  apixaban (ELIQUIS) 5 MG TABS tablet Take 1 tablet (5 mg total) by mouth 2 (two) times daily. 12/09/15  Yes Arnoldo Lenis, MD  buPROPion (WELLBUTRIN XL) 300 MG 24 hr tablet Take 1 tablet by mouth daily. 10/10/15  Yes Historical Provider, MD  diltiazem (CARDIZEM CD) 360 MG 24 hr capsule Take 1 capsule (360 mg total) by mouth daily. 11/08/15  Yes Almyra Deforest, PA  doxycycline (VIBRA-TABS) 100 MG tablet Take 100 mg by mouth 2 (two) times daily. For 14 days 03/11/16  Yes Historical Provider, MD  levocetirizine (XYZAL) 5 MG tablet Take 5 mg by mouth every evening.   Yes Historical Provider, MD  predniSONE (DELTASONE) 10 MG tablet Take 50 mg by mouth as directed. 6 day pak (started on 03/11/13 with 6 tabs,then 5 tabs today 03/12/16,then 4 tabs,then 3 tabs,then 2 tabs,then 1 tab then stop) 03/11/16  Yes Historical Provider, MD  simvastatin (ZOCOR) 20 MG tablet Take 20 mg by mouth daily at 6 PM.    Yes Historical Provider,  MD  SYMBICORT 160-4.5 MCG/ACT inhaler Inhale 1 puff into the lungs daily. 02/16/16  Yes Praveen Mannam, MD  albuterol (PROVENTIL) (2.5 MG/3ML) 0.083% nebulizer solution Take 3 mLs (2.5 mg total) by nebulization every 6 (six) hours as needed for wheezing or shortness of breath. 11/08/15   Almyra Deforest, PA  meclizine (ANTIVERT) 25 MG tablet TAKE ONE TABLET BY MOUTH THREE TIMES DAILY AS NEEDED FOR DIZZINESS Patient not taking: Reported on 02/16/2016 01/27/16   Arnoldo Lenis, MD    Family History    Family History  Problem Relation Age of Onset  . Heart disease Mother   . COPD Father     Social History    Social History   Social History  . Marital status: Divorced    Spouse  name: N/A  . Number of children: N/A  . Years of education: N/A   Occupational History  . Not on file.   Social History Main Topics  . Smoking status: Former Smoker    Packs/day: 1.00    Years: 50.00    Types: Cigarettes    Start date: 12/15/1965    Quit date: 10/29/2015  . Smokeless tobacco: Never Used  . Alcohol use No  . Drug use: No  . Sexual activity: Not Currently   Other Topics Concern  . Not on file   Social History Narrative  . No narrative on file     Review of Systems    General:  No chills, fever, night sweats or weight changes.  Cardiovascular:  See HPI Dermatological: No rash, lesions/masses Respiratory: No cough, dyspnea Urologic: No hematuria, dysuria Abdominal:   No nausea, vomiting, diarrhea, bright red blood per rectum, melena, or hematemesis Neurologic:  No visual changes, wkns, changes in mental status. All other systems reviewed and are otherwise negative except as noted above.  Physical Exam    Blood pressure 138/73, pulse 73, temperature 97.9 F (36.6 C), temperature source Oral, resp. rate 18, height 5\' 6"  (1.676 m), weight 125 lb (56.7 kg), SpO2 97 %.  General: Pleasant older WF, NAD Psych: Normal affect. Neuro: Alert and oriented X 3. Moves all extremities spontaneously. HEENT: Normal  Neck: Supple without bruits or JVD. Lungs:  Resp regular and unlabored, CTA. Heart: RRR no s3, s4, or murmurs. Abdomen: Soft, non-tender, non-distended, BS + x 4.  Extremities: No clubbing, cyanosis or edema. DP/PT/Radials 2+ and equal bilaterally.  Labs    Troponin (Point of Care Test) No results for input(s): TROPIPOC in the last 72 hours.  Recent Labs  03/12/16 0830 03/12/16 1131  TROPONINI 0.19* 0.31*   Lab Results  Component Value Date   WBC 10.3 03/12/2016   HGB 12.1 03/12/2016   HCT 36.9 03/12/2016   MCV 92.3 03/12/2016   PLT 261 03/12/2016    Recent Labs Lab 03/12/16 0830  NA 137  K 3.8  CL 108  CO2 19*  BUN 23*  CREATININE  1.30*  CALCIUM 9.3  PROT 7.4  BILITOT 0.4  ALKPHOS 83  ALT 28  AST 38  GLUCOSE 177*   Lab Results  Component Value Date   CHOL 75 10/31/2015   HDL 23 (L) 10/31/2015   LDLCALC 37 10/31/2015   TRIG 74 10/31/2015   No results found for: [facility]   Radiology Studies    Dg Chest Portable 1 View  Result Date: 03/12/2016 CLINICAL DATA:  Upper back pain.  Acute coronary syndrome EXAM: PORTABLE CHEST 1 VIEW COMPARISON:  12/03/2015 FINDINGS: There is hyperinflation of the  lungs compatible with COPD. Heart is borderline in size. No confluent opacities, effusions or acute bony abnormality. IMPRESSION: COPD.  No active disease. Electronically Signed   By: Rolm Baptise M.D.   On: 03/12/2016 08:52    ECG & Cardiac Imaging    EKG: SR with ST depression in inferior lateral leads, improved follow up EKGs.   Echo: 7/17  Study Conclusions  - Left ventricle: Systolic function was normal. The estimated   ejection fraction was in the range of 55% to 60%. Wall motion was   normal; there were no regional wall motion abnormalities. 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 - Mitral valve: Calcified annulus. There was mild to moderate   regurgitation directed centrally. Valve area by pressure   half-time: 2.34 cm^2. - Pulmonary arteries: Systolic pressure was mildly increased. PA   peak pressure: 41 mm Hg (S).  Assessment & Plan    75 yo female with PMH of HTN, Atrial flutter, tobacco use, COPD and Breast Ca who presented to [facility] with upper back pain between her shoulder blades and radiation into her arms. Noted to have EKG changes and positive trop.   1. NSTEMI: Presented to [facility] with atypical pain, upper back with radiation down into her bilateral arms that started at 5am. Remained constant. EKG there showed inferior lateral ST depression that improved with follow up EKGs. Trop 0.19>>0.30 thus far. Pain improved with morphine and SL nitro. Does have risk factors  including HTN, tobacco use and family hx with her mother having an MI.  -- Admit to stepdown -- cycle enzymes -- start IV heparin, hold Eliquis (last dose was this morning) -- plan for [facility] on Monday  2. Atrial flutter: In SR on admission. Continue amiodarone and Cardizem.  -- Start IV heparin for a/c.   3. COPD: was seen by her PCP yesterday and placed on doxycycline for 14 days and prednisone taper.   Barnet Pall, NP-C Pager [phone] 03/12/2016, 2:00 PM

## 2016-03-12 NOTE — ED Notes (Signed)
Pt reports right posterior shoulder and back pain 5/10. EKG obtained. EDP notified. Morphine ordered.

## 2016-03-12 NOTE — ED Provider Notes (Signed)
[place] DEPT Provider Note   CSN: [id number] Arrival date & time: 03/12/16  6629     History   Chief Complaint Chief Complaint  Patient presents with  . Neck Pain    HPI Patricia Jackson is a 75 y.o. female.  HPI Patient presents to the emergency department with discomfort coming across both her shoulder blades and down both arms of the past 2 hours.  She has had this discomfort intermittently over the past several months.  She is seen pulmonary as well as cardiology and no etiology has been found.  She has not had a stress test.  The pain feels like a pressure-like sensation without associated shortness of breath.  She has a history of paroxysmal atrial flutter she is on eliquisAnd amiodarone.  The plan was for an 8 week course of amiodarone.  She denies palpitations.  She reports her symptoms persist.  No prior heart catheterization or stress test.  Symptoms were felt to be noncardiac in etiology by Dr. Harl Bowie.  Last office visit 2 months ago.  She has a history of COPD and pulmonary fibrosis.   Past Medical History:  Diagnosis Date  . Breast cancer ([place])   . COPD (chronic obstructive pulmonary disease) ([place])   . Hypertension     Patient Active Problem List   Diagnosis Date Noted  . Shortness of breath   . Acute respiratory failure with hypoxia ([place])   . Hypophosphatemia   . 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[place]) 10/31/2015  . Acute exacerbation of chronic bronchitis ([place]) 10/31/2015  . COPD exacerbation ([place]) 10/31/2015  . Dyslipidemia 10/31/2015  . Elevated troponin 10/31/2015  . Atrial flutter with rapid ventricular response ([place]) 10/30/2015  . SOB (shortness of breath) 11/26/2013  . Back pain 11/26/2013  . Arm pain 11/26/2013  . Chest pain 11/26/2013  . Hypertension     Past Surgical History:  Procedure Laterality Date  . APPENDECTOMY    . BREAST SURGERY      OB History    Gravida Para Term Preterm AB Living             1   SAB TAB Ectopic Multiple Live Births                   Home Medications    Prior to Admission medications   Medication Sig Start Date End Date Taking? Authorizing Provider  albuterol (PROVENTIL) (2.5 MG/3ML) 0.083% nebulizer solution Take 3 mLs (2.5 mg total) by nebulization every 6 (six) hours as needed for wheezing or shortness of breath. 11/08/15   Almyra Deforest, PA  amiodarone (PACERONE) 200 MG tablet Take 1 tablet (200 mg total) by mouth daily. 11/08/15   Almyra Deforest, PA  apixaban (ELIQUIS) 5 MG TABS tablet Take 1 tablet (5 mg total) by mouth 2 (two) times daily. 12/09/15   Arnoldo Lenis, MD  buPROPion (WELLBUTRIN XL) 300 MG 24 hr tablet Take 1 tablet by mouth daily. 10/10/15   Historical Provider, MD  diltiazem (CARDIZEM CD) 360 MG 24 hr capsule Take 1 capsule (360 mg total) by mouth daily. 11/08/15   Almyra Deforest, PA  levocetirizine (XYZAL) 5 MG tablet Take 5 mg by mouth every evening.    Historical Provider, MD  meclizine (ANTIVERT) 25 MG tablet TAKE ONE TABLET BY MOUTH THREE TIMES DAILY AS NEEDED FOR DIZZINESS Patient not taking: Reported on 02/16/2016 01/27/16   Arnoldo Lenis, MD  simvastatin (ZOCOR) 20 MG tablet Take 20 mg by mouth daily.    Historical  Provider, MD  SYMBICORT 160-4.5 MCG/ACT inhaler Inhale 1 puff into the lungs daily. 02/16/16   Praveen Mannam, MD  traMADol (ULTRAM) 50 MG tablet Take 1 tablet by mouth every 6 (six) hours as needed for moderate pain or severe pain.  10/16/15   Historical Provider, MD    Family History Family History  Problem Relation Age of Onset  . Heart disease Mother   . COPD Father     Social History Social History  Substance Use Topics  . Smoking status: Former Smoker    Packs/day: 1.00    Years: 50.00    Types: Cigarettes    Start date: 12/15/1965    Quit date: 10/29/2015  . Smokeless tobacco: Never Used  . Alcohol use No     Allergies   Sulfa antibiotics   Review of Systems Review of Systems  All other systems reviewed and are negative.    Physical Exam Updated Vital  Signs BP 153/88   Pulse 92   Temp 97.7 F (36.5 C) (Oral)   Resp 24   Ht 5' 7"  (1.702 m)   Wt 135 lb (61.2 kg)   SpO2 100%   BMI 21.14 kg/m   Physical Exam  Constitutional: She is oriented to person, place, and time. She appears well-developed and well-nourished. No distress.  HENT:  Head: Normocephalic and atraumatic.  Eyes: EOM are normal.  Neck: Normal range of motion.  Cardiovascular: Normal rate, regular rhythm and normal heart sounds.   Pulmonary/Chest: Effort normal and breath sounds normal.  Abdominal: Soft. She exhibits no distension. There is no tenderness.  Musculoskeletal: Normal range of motion.  Neurological: She is alert and oriented to person, place, and time.  Skin: Skin is warm and dry.  Psychiatric: She has a normal mood and affect. Judgment normal.  Nursing note and vitals reviewed.    ED Treatments / Results  Labs (all labs ordered are listed, but only abnormal results are displayed) Labs Reviewed  CBC WITH DIFFERENTIAL/PLATELET - Abnormal; Notable for the following:       Result Value   Neutro Abs 8.3 (*)    All other components within normal limits  COMPREHENSIVE METABOLIC PANEL - Abnormal; Notable for the following:    CO2 19 (*)    Glucose, Bld 177 (*)    BUN 23 (*)    Creatinine, Ser 1.30 (*)    GFR calc non Af Amer 40 (*)    GFR calc Af Amer 46 (*)    All other components within normal limits  PROTIME-INR - Abnormal; Notable for the following:    Prothrombin Time 17.4 (*)    All other components within normal limits  APTT - Abnormal; Notable for the following:    aPTT 45 (*)    All other components within normal limits  TROPONIN I    EKG  EKG Interpretation #1  Date/Time:  Friday March 12 2016 08:21:17 EST Ventricular Rate:  90 PR Interval:    QRS Duration: 117 QT Interval:  408 QTC Calculation: 500 R Axis:   97 Text Interpretation:  Sinus rhythm Consider left atrial enlargement Consider left ventricular hypertrophy ST  depr, consider ischemia, anterolateral lds Borderline prolonged QT interval new inferior and lateral st depression as compared to ECG on Dec 03, 2015 Confirmed by Hakop Humbarger  MD, Tita Terhaar ([id number]) on 03/12/2016 8:29:04 AM         EKG Interpretation #2  Date/Time:  Friday March 12 2016 08:34:11 EST Ventricular Rate:  87 PR Interval:  QRS Duration: 117 QT Interval:  397 QTC Calculation: 478 R Axis:   93 Text Interpretation:  Sinus rhythm Consider left ventricular hypertrophy ST depr, consider ischemia, anterolateral lds Baseline wander in lead(s) V1 No significant change was found Confirmed by Pedram Goodchild  MD, Lennette Bihari ([id number]) on 03/12/2016 9:03:35 AM         Radiology Dg Chest Portable 1 View  Result Date: 03/12/2016 CLINICAL DATA:  Upper back pain.  Acute coronary syndrome EXAM: PORTABLE CHEST 1 VIEW COMPARISON:  12/03/2015 FINDINGS: There is hyperinflation of the lungs compatible with COPD. Heart is borderline in size. No confluent opacities, effusions or acute bony abnormality. IMPRESSION: COPD.  No active disease. Electronically Signed   By: Rolm Baptise M.D.   On: 03/12/2016 08:52    Procedures Procedures (including critical care time)      ++++++++++++++++++++++++++++++++++++++++++++++++++++++++++++++  CRITICAL CARE Performed by: Hoy Morn Total critical care time: 33 minutes Critical care time was exclusive of separately billable procedures and treating other patients. Critical care was necessary to treat or prevent imminent or life-threatening deterioration. Critical care was time spent personally by me on the following activities: development of treatment plan with patient and/or surrogate as well as nursing, discussions with consultants, evaluation of patient's response to treatment, examination of patient, obtaining history from patient or surrogate, ordering and performing treatments and interventions, ordering and review of laboratory studies, ordering and review of  radiographic studies, pulse oximetry and re-evaluation of patient's condition.  ++++++++++++++++++++++++++++++++++++++++++++++++++++++++++++++       Medications Ordered in ED Medications  aspirin chewable tablet 324 mg (324 mg Oral Given 03/12/16 0835)  morphine 4 MG/ML injection 4 mg (4 mg Intravenous Given 03/12/16 0847)     Initial Impression / Assessment and Plan / ED Course  I have reviewed the triage vital signs and the nursing notes.  Pertinent labs & imaging results that were available during my care of the patient were reviewed by me and considered in my medical decision making (see chart for details).  Clinical Course     Symptoms concerning for acute coronary syndrome given her new inferior lateral ST depression which is new from her prior EKGs.  She'll be treated with aspirin.  Will treat her pain with morphine.  Labs and x-ray pending.  I will contact cardiology for transfer to [facility] for cardiac evaluation and heart catheterization.  She is currently on Eliquis.  I'll ask cardiology if they want heparin bolus and drip in addition.  Troponin pending.  9:40 AM Accepted in transfer to [facility] by Dr Oval Linsey, cardiology  Final Clinical Impressions(s) / ED Diagnoses   Final diagnoses:  Acute coronary syndrome [facility])    New Prescriptions New Prescriptions   No medications on file     Jola Schmidt, MD 03/12/16 [phone]

## 2016-03-12 NOTE — Progress Notes (Signed)
CRITICAL VALUE ALERT  Critical value received:  Troponin 0.76  Date of notification:  03/12/16  Time of notification:  6751  Critical value read back:Yes.    Nurse who received alert:  Rodman Key, RN  MD notified (1st page):  Oval Linsey   Time of first page:  84  MD notified (2nd page):  Time of second page:  Responding MD:  Oval Linsey  Time MD responded:  [phone]

## 2016-03-13 LAB — HEPARIN LEVEL (UNFRACTIONATED): Heparin Unfractionated: 1.62 IU/mL — ABNORMAL HIGH (ref 0.30–0.70)

## 2016-03-13 LAB — LIPID PANEL
Cholesterol: 159 mg/dL (ref 0–200)
HDL: 70 mg/dL (ref >40)
LDL Cholesterol: 76 mg/dL (ref 0–99)
Total CHOL/HDL Ratio: 2.3 RATIO
Triglycerides: 63 mg/dL (ref <150)
VLDL: 13 mg/dL (ref 0–40)

## 2016-03-13 LAB — BASIC METABOLIC PANEL
Anion gap: 9 (ref 5–15)
BUN: 24 mg/dL — ABNORMAL HIGH (ref 6–20)
CO2: 23 mmol/L (ref 22–32)
Calcium: 9.2 mg/dL (ref 8.9–10.3)
Chloride: 107 mmol/L (ref 101–111)
Creatinine, Ser: 1.35 mg/dL — ABNORMAL HIGH (ref 0.44–1.00)
GFR calc Af Amer: 44 mL/min — ABNORMAL LOW (ref >60)
GFR calc non Af Amer: 38 mL/min — ABNORMAL LOW (ref >60)
Glucose, Bld: 106 mg/dL — ABNORMAL HIGH (ref 65–99)
Potassium: 4 mmol/L (ref 3.5–5.1)
Sodium: 139 mmol/L (ref 135–145)

## 2016-03-13 LAB — TROPONIN I: Troponin I: 0.88 ng/mL (ref <0.03)

## 2016-03-13 LAB — CBC
HCT: 35.2 % — ABNORMAL LOW (ref 36.0–46.0)
Hemoglobin: 11.3 g/dL — ABNORMAL LOW (ref 12.0–15.0)
MCH: 29.3 pg (ref 26.0–34.0)
MCHC: 32.1 g/dL (ref 30.0–36.0)
MCV: 91.2 fL (ref 78.0–100.0)
Platelets: 265 10*3/uL (ref 150–400)
RBC: 3.86 MIL/uL — ABNORMAL LOW (ref 3.87–5.11)
RDW: 13.5 % (ref 11.5–15.5)
WBC: 9 10*3/uL (ref 4.0–10.5)

## 2016-03-13 LAB — APTT
aPTT: 91 seconds — ABNORMAL HIGH (ref 24–36)
aPTT: 82 seconds — ABNORMAL HIGH (ref 24–36)

## 2016-03-13 MED ORDER — NITROGLYCERIN IN D5W 200-5 MCG/ML-% IV SOLN
2.0000 ug/min | INTRAVENOUS | Status: DC
  Filled 2016-03-13: qty 250

## 2016-03-13 MED ORDER — MORPHINE SULFATE (PF) 2 MG/ML IV SOLN
2.0000 mg | INTRAVENOUS | Status: DC | PRN
  Administered 2016-03-13 – 2016-03-15 (×7): 2 mg via INTRAVENOUS
  Filled 2016-03-13 (×7): qty 1

## 2016-03-13 MED ORDER — METOPROLOL SUCCINATE ER 50 MG PO TB24
100.0000 mg | ORAL_TABLET | Freq: Every day | ORAL | Status: DC
  Administered 2016-03-14 – 2016-03-16 (×2): 100 mg via ORAL
  Filled 2016-03-13 (×2): qty 2

## 2016-03-13 MED ORDER — NITROGLYCERIN 2 % TD OINT
1.0000 [in_us] | TOPICAL_OINTMENT | Freq: Four times a day (QID) | TRANSDERMAL | Status: DC
  Administered 2016-03-13 – 2016-03-16 (×11): 1 [in_us] via TOPICAL
  Filled 2016-03-13 (×2): qty 30

## 2016-03-13 NOTE — Progress Notes (Signed)
ANTICOAGULATION CONSULT NOTE - Follow Up Consult  Pharmacy Consult for Heparin (Apixaban on hold) Indication: NSTEMI  Allergies  Allergen Reactions  . Sulfa Antibiotics Hives    Patient Measurements: Height: 5\' 6"  (167.6 cm) Weight: 125 lb (56.7 kg) IBW/kg (Calculated) : 59.3  Vital Signs: Temp: 97.5 F (36.4 C) (12/09 0330) Temp Source: Oral (12/09 0330) BP: 136/78 (12/09 0330) Pulse Rate: 72 (12/09 0330)  Labs:  Recent Labs  03/12/16 0830 03/12/16 1131 03/12/16 1521 03/12/16 2251 03/13/16 0324  HGB 12.1  --   --   --  11.3*  HCT 36.9  --   --   --  35.2*  PLT 261  --   --   --  265  APTT 45*  --   --   --  91*  LABPROT 17.4*  --   --   --   --   INR 1.41  --   --   --   --   CREATININE 1.30*  --   --   --   --   TROPONINI 0.19* 0.31* 0.76* 0.91*  --     Estimated Creatinine Clearance: 34.5 mL/min (by C-G formula based on SCr of 1.3 mg/dL (H)).   Assessment: Heparin for NSTEMI, initial aPTT is therapeutic, using aPTT to dose for now given apixaban potential to influence anti-Xa levels.   Goal of Therapy:  Heparin level 0.3-0.7 units/ml aPTT 66-102 seconds Monitor platelets by anticoagulation protocol: Yes   Plan:  -Cont heparin 800 units/hr -1200 aPTT  Narda Bonds 03/13/2016,4:51 AM

## 2016-03-13 NOTE — Progress Notes (Signed)
Pt c/o CP. EKG done Nitro given and PA called.  CP resolved. Orders given to start IV nitro and prn morphine if CP returns.

## 2016-03-13 NOTE — Progress Notes (Signed)
Primary cardiologist:  Subjective:    Episode of chest pain this AM, resolved with NG  Objective:   Temp:  [97.4 F (36.3 C)-97.9 F (36.6 C)] 97.5 F (36.4 C) (12/09 0800) Pulse Rate:  [64-73] 69 (12/09 1000) Resp:  [18-24] 24 (12/09 1000) BP: (121-146)/(62-81) 134/63 (12/09 1000) SpO2:  [91 %-97 %] 91 % (12/09 1000) Weight:  [125 lb (56.7 kg)] 125 lb (56.7 kg) (12/08 1300)    Filed Weights   03/12/16 0818 03/12/16 1300  Weight: 135 lb (61.2 kg) 125 lb (56.7 kg)    Intake/Output Summary (Last 24 hours) at 03/13/16 1107 Last data filed at 03/13/16 0400  Gross per 24 hour  Intake            802.4 ml  Output              303 ml  Net            499.4 ml    Telemetry: NSR  Exam:  General: NAD  HEENT: sclera clear, throat clear  Resp: CTAB  Cardiac: RRR, no m/r/g, no jvd  GI: abdomen soft, NT, Nd  MSK:no LE edema  Neuro: no focal deficits  Psych: appropraite affect  Lab Results:  Basic Metabolic Panel:  Recent Labs Lab 03/12/16 0830 03/13/16 0324  NA 137 139  K 3.8 4.0  CL 108 107  CO2 19* 23  GLUCOSE 177* 106*  BUN 23* 24*  CREATININE 1.30* 1.35*  CALCIUM 9.3 9.2    Liver Function Tests:  Recent Labs Lab 03/12/16 0830  AST 38  ALT 28  ALKPHOS 83  BILITOT 0.4  PROT 7.4  ALBUMIN 3.7    CBC:  Recent Labs Lab 03/12/16 0830 03/13/16 0324  WBC 10.3 9.0  HGB 12.1 11.3*  HCT 36.9 35.2*  MCV 92.3 91.2  PLT 261 265    Cardiac Enzymes:  Recent Labs Lab 03/12/16 1521 03/12/16 2251 03/13/16 0324  TROPONINI 0.76* 0.91* 0.88*    BNP: No results for input(s): PROBNP in the last 8760 hours.  Coagulation:  Recent Labs Lab 03/12/16 0830  INR 1.41    ECG:   Medications:   Scheduled Medications: . amiodarone  200 mg Oral Daily  . aspirin EC  81 mg Oral Daily  . buPROPion  300 mg Oral Daily  . diltiazem  360 mg Oral Daily  . doxycycline  100 mg Oral BID  . mometasone-formoterol  2 puff Inhalation BID  .  nitroGLYCERIN  0.4 mg Sublingual Once  . [START ON 03/16/2016] predniSONE  10 mg Oral Q breakfast  . [START ON 03/15/2016] predniSONE  20 mg Oral Once  . [START ON 03/14/2016] predniSONE  30 mg Oral Once  . predniSONE  50 mg Oral UD  . simvastatin  20 mg Oral q1800     Infusions: . heparin 800 Units/hr (03/12/16 1742)  . nitroGLYCERIN       PRN Medications:  acetaminophen, albuterol, morphine injection, nitroGLYCERIN, ondansetron (ZOFRAN) IV     Assessment/Plan    1. NSTEMI - somewhat atypical symptoms - tropononin peak to 0.91, now trending down. EKG lateral ST depressions.  - started on medical therapy: heparin gtt, ASA, simva 20 (home dose). She reports elevated LFTs on more atorvastatin before.  - echo 10/2015 LVEF 55-60%, no WMAs - chest pain this AM, resolved with SL NG. Will place NG patch, consider NG drip if needed. If refactory pain consider cath over weekend if not plan for Monday.  2. Aflutter - on amio, diltiazem. Eliquis on hold for planned cath.  - change dilt to Metoprolol XL starting tomorrow in setting of ACS.        Carlyle Dolly, M.D., F.A.C.C.Patient ID: Patricia Jackson, female   DOB: January 25, 1941, 75 y.o.   MRN: [id number]

## 2016-03-13 NOTE — Progress Notes (Signed)
  CRITICAL VALUE ALERT  Critical value received:  Troponin 0.91. MD notified via text page and has responded. Patient denies any active chest pain and is otherwise asymptomatic besides a 4/10 back pain and is hemodynamically stable. Will continue to monitor for any changes. Lenna Sciara, RN

## 2016-03-13 NOTE — Progress Notes (Signed)
ANTICOAGULATION CONSULT NOTE - Follow Up Consult  Pharmacy Consult for Heparin (Apixaban on hold) Indication: NSTEMI  Patient Measurements: Height: 5\' 6"  (167.6 cm) Weight: 125 lb (56.7 kg) IBW/kg (Calculated) : 59.3  Vital Signs: Temp: 97.5 F (36.4 C) (12/09 0800) Temp Source: Oral (12/09 0800) BP: 134/63 (12/09 1000) Pulse Rate: 69 (12/09 1000)  Labs:  Recent Labs  03/12/16 0830  03/12/16 1521 03/12/16 2251 03/13/16 0324 03/13/16 0914  HGB 12.1  --   --   --  11.3*  --   HCT 36.9  --   --   --  35.2*  --   PLT 261  --   --   --  265  --   APTT 45*  --   --   --  91* 82*  LABPROT 17.4*  --   --   --   --   --   INR 1.41  --   --   --   --   --   HEPARINUNFRC  --   --   --   --  1.62*  --   CREATININE 1.30*  --   --   --  1.35*  --   TROPONINI 0.19*  < > 0.76* 0.91* 0.88*  --   < > = values in this interval not displayed.  Estimated Creatinine Clearance: 33.2 mL/min (by C-G formula based on SCr of 1.35 mg/dL (H)).   Assessment: Heparin for NSTEMI, using aPTT to dose for now given apixaban potential to influence anti-Xa levels. APTT has now been therapeutic *2. Will use daily aPTT/heparin level until they correlate. Plan for [facility] on Monday.  Goal of Therapy:  Heparin level 0.3-0.7 units/ml aPTT 66-102 seconds Monitor platelets by anticoagulation protocol: Yes   Plan:  -Cont heparin 800 units/hr -Daily heparin level, aPTT, CBC  Melburn Popper, PharmD Clinical Pharmacy Resident Pager: [phone] 03/13/16 10:09 AM

## 2016-03-13 NOTE — Significant Event (Signed)
Rapid Response Event Note  Overview: Time Called: 0930 Arrival Time: 0930 Event Type: Cardiac  Initial Focused Assessment: Patient admitted with NSTEMI. CO upper back pain, between shoulder blades  And radiating into bilat arms.  Atypical chest pain 8/10.  12 lead EKG done with ST depression in V2-V6.  Per RN patient has received 1 SL NTG. Pain now 4/10 and easing BP 134/63  SR 69 RR 24 O2 sat 91% on RN  Interventions: Placed on 4l [place], O2 sats 100% Weaned to 2L [place] O2 sats 100% Patient states that she is pain free.   Plan of Care (if not transferred): Initiated NTG gtt if pain returns RN to call if assistance needed  Event Summary: Name of Physician Notified: Dr Radford Pax and Calton Golds PA at 0935    at    Outcome: Stayed in room and stabalized  Event End Time: [address]

## 2016-03-14 LAB — CBC
HCT: 36.2 % (ref 36.0–46.0)
Hemoglobin: 11.9 g/dL — ABNORMAL LOW (ref 12.0–15.0)
MCH: 29.7 pg (ref 26.0–34.0)
MCHC: 32.9 g/dL (ref 30.0–36.0)
MCV: 90.3 fL (ref 78.0–100.0)
Platelets: 261 10*3/uL (ref 150–400)
RBC: 4.01 MIL/uL (ref 3.87–5.11)
RDW: 13.1 % (ref 11.5–15.5)
WBC: 9 10*3/uL (ref 4.0–10.5)

## 2016-03-14 LAB — APTT: aPTT: 67 seconds — ABNORMAL HIGH (ref 24–36)

## 2016-03-14 LAB — HEPARIN LEVEL (UNFRACTIONATED): Heparin Unfractionated: 0.76 IU/mL — ABNORMAL HIGH (ref 0.30–0.70)

## 2016-03-14 MED ORDER — SODIUM CHLORIDE 0.9 % IV SOLN
INTRAVENOUS | Status: DC
  Administered 2016-03-14 – 2016-03-15 (×2): via INTRAVENOUS

## 2016-03-14 MED ORDER — SODIUM CHLORIDE 0.9 % WEIGHT BASED INFUSION
1.0000 mL/kg/h | INTRAVENOUS | Status: DC
  Administered 2016-03-15: 1 mL/kg/h via INTRAVENOUS

## 2016-03-14 MED ORDER — SODIUM CHLORIDE 0.9 % IV SOLN: 250.0000 mL | INTRAVENOUS | Status: DC | PRN

## 2016-03-14 MED ORDER — SODIUM CHLORIDE 0.9% FLUSH
3.0000 mL | Freq: Two times a day (BID) | INTRAVENOUS | Status: DC
  Administered 2016-03-14: 3 mL via INTRAVENOUS

## 2016-03-14 MED ORDER — SODIUM CHLORIDE 0.9 % WEIGHT BASED INFUSION: 3.0000 mL/kg/h | INTRAVENOUS | Status: DC

## 2016-03-14 MED ORDER — ASPIRIN 81 MG PO CHEW
81.0000 mg | CHEWABLE_TABLET | ORAL | Status: AC
  Administered 2016-03-15: 81 mg via ORAL
  Filled 2016-03-14: qty 1

## 2016-03-14 MED ORDER — SODIUM CHLORIDE 0.9% FLUSH: 3.0000 mL | INTRAVENOUS | Status: DC | PRN

## 2016-03-14 NOTE — Progress Notes (Addendum)
Subjective:    Episode of nausea and vomiting this AM after breakfast, had some chest pain right after that has resolved.   Objective:   Temp:  [97.4 F (36.3 C)-98.5 F (36.9 C)] 98.3 F (36.8 C) (12/10 0751) Pulse Rate:  [64-73] 64 (12/10 0840) Resp:  [18-31] 24 (12/10 0751) BP: (117-144)/(60-94) 140/69 (12/10 0840) SpO2:  [96 %-100 %] 98 % (12/10 0751) Last BM Date: 03/13/16  Filed Weights   03/12/16 0818 03/12/16 1300  Weight: 135 lb (61.2 kg) 125 lb (56.7 kg)    Intake/Output Summary (Last 24 hours) at 03/14/16 1130 Last data filed at 03/14/16 0924  Gross per 24 hour  Intake             1034 ml  Output                0 ml  Net             1034 ml    Telemetry:SR  Exam:  General: NAD  HEENT: sclera clear, throat clear  Resp: CTAB  Cardiac: RRR, no m/r/g, no jvd  GI: abdomen soft, NT, ND  MSK: no LE edema  Neuro: no focal deficits  Psych: appropriate affect  Lab Results:  Basic Metabolic Panel:  Recent Labs Lab 03/12/16 0830 03/13/16 0324  NA 137 139  K 3.8 4.0  CL 108 107  CO2 19* 23  GLUCOSE 177* 106*  BUN 23* 24*  CREATININE 1.30* 1.35*  CALCIUM 9.3 9.2    Liver Function Tests:  Recent Labs Lab 03/12/16 0830  AST 38  ALT 28  ALKPHOS 83  BILITOT 0.4  PROT 7.4  ALBUMIN 3.7    CBC:  Recent Labs Lab 03/12/16 0830 03/13/16 0324 03/14/16 0422  WBC 10.3 9.0 9.0  HGB 12.1 11.3* 11.9*  HCT 36.9 35.2* 36.2  MCV 92.3 91.2 90.3  PLT 261 265 261    Cardiac Enzymes:  Recent Labs Lab 03/12/16 1521 03/12/16 2251 03/13/16 0324  TROPONINI 0.76* 0.91* 0.88*    BNP: No results for input(s): PROBNP in the last 8760 hours.  Coagulation:  Recent Labs Lab 03/12/16 0830  INR 1.41    ECG:   Medications:   Scheduled Medications: . amiodarone  200 mg Oral Daily  . aspirin EC  81 mg Oral Daily  . buPROPion  300 mg Oral Daily  . doxycycline  100 mg Oral BID  . metoprolol succinate  100 mg Oral Daily  .  mometasone-formoterol  2 puff Inhalation BID  . nitroGLYCERIN  1 inch Topical Q6H  . nitroGLYCERIN  0.4 mg Sublingual Once  . [START ON 03/16/2016] predniSONE  10 mg Oral Q breakfast  . [START ON 03/15/2016] predniSONE  20 mg Oral Once  . predniSONE  50 mg Oral UD  . simvastatin  20 mg Oral q1800     Infusions: . heparin 800 Units/hr (03/14/16 0300)  . nitroGLYCERIN       PRN Medications:  acetaminophen, albuterol, morphine injection, nitroGLYCERIN, ondansetron (ZOFRAN) IV     Assessment/Plan    1. NSTEMI - somewhat atypical symptoms - tropononin peak to 0.91, now trending down. EKG lateral ST depressions.  - started on medical therapy: heparin gtt, ASA, simva 20 (home dose), Toprol. She reports elevated LFTs on atorvastatin before. GFR 38, not on ACE-I/ARB - echo 10/2015 LVEF 55-60%, no WMAs -continue medical therapy today, as long as pain free still plan for cath tomorrow.  .  I have reviewed the  risks, indications, and alternatives to cardiac catheterization, possible angioplasty, and stenting with Mrs. Revere. Risks include but are not limited to bleeding, infection, vascular injury, stroke, myocardial infection, arrhythmia, kidney injury, radiation-related injury in the case of prolonged fluoroscopy use, emergency cardiac surgery, and death. The patient understands the risks of serious complication is 1-2 in 7949 with diagnostic cardiac cath and 1-2% or less with angioplasty/stenting.     2. Aflutter - on amio, diltiazem. Eliquis on hold for planned cath.  - change dilt to Metoprolol XL starting tomorrow in setting of ACS.  - continue current meds   3. CKD - fairly labile renal function over the last several months, ranging from 1-2.  - currently Cr 1.3, will need gentle IVFs today prior to cath    Carlyle Dolly, M.D..Patient ID: Nena Jordan, female   DOB: September 17, 1940, 75 y.o.   MRN: [id number]

## 2016-03-14 NOTE — Progress Notes (Addendum)
Pt had an episode of vomiting followed by an episode of 5/10 pain in back and arms. EKG done and 2 mg IV morphine given. Nitro patch still in place and not due for change until 1200. PA notified. Pain resolved. Awaiting return call. Will continue to monitor.  PA returned call. No new orders at this time.

## 2016-03-14 NOTE — Progress Notes (Signed)
ANTICOAGULATION CONSULT NOTE - Follow Up Consult  Pharmacy Consult for Heparin (Apixaban on hold) Indication: NSTEMI  Patient Measurements: Height: 5\' 6"  (167.6 cm) Weight: 125 lb (56.7 kg) IBW/kg (Calculated) : 59.3  Vital Signs: Temp: 98.3 F (36.8 C) (12/10 0751) Temp Source: Oral (12/10 0751) BP: 140/69 (12/10 0751) Pulse Rate: 73 (12/10 0751)  Labs:  Recent Labs  03/12/16 0830  03/12/16 1521 03/12/16 2251 03/13/16 0324 03/13/16 0914 03/14/16 0422  HGB 12.1  --   --   --  11.3*  --  11.9*  HCT 36.9  --   --   --  35.2*  --  36.2  PLT 261  --   --   --  265  --  261  APTT 45*  --   --   --  91* 82* 67*  LABPROT 17.4*  --   --   --   --   --   --   INR 1.41  --   --   --   --   --   --   HEPARINUNFRC  --   --   --   --  1.62*  --  0.76*  CREATININE 1.30*  --   --   --  1.35*  --   --   TROPONINI 0.19*  < > 0.76* 0.91* 0.88*  --   --   < > = values in this interval not displayed.  Estimated Creatinine Clearance: 33.2 mL/min (by C-G formula based on SCr of 1.35 mg/dL (H)).   Assessment: Heparin for NSTEMI, using aPTT to dose for now given apixaban potential to influence anti-Xa levels. APTT therapeutic *3. Will use daily aPTT/heparin level until they correlate. Plan for [facility] on Monday.  Goal of Therapy:  Heparin level 0.3-0.7 units/ml aPTT 66-102 seconds Monitor platelets by anticoagulation protocol: Yes   Plan:  -Cont heparin 800 units/hr -Daily heparin level, aPTT, CBC  Melburn Popper, PharmD Clinical Pharmacy Resident Pager: [phone] 03/14/16 8:26 AM

## 2016-03-15 ENCOUNTER — Encounter (HOSPITAL_COMMUNITY)
Admission: EM | Disposition: A | Discharge status: Home / Self Care | Payer: Self-pay | Source: Home / Self Care | Attending: Cardiovascular Disease

## 2016-03-15 ENCOUNTER — Encounter (HOSPITAL_COMMUNITY): Payer: Self-pay | Admitting: Interventional Cardiology

## 2016-03-15 ENCOUNTER — Other Ambulatory Visit: Payer: Self-pay

## 2016-03-15 DIAGNOSIS — I249 Acute ischemic heart disease, unspecified: Secondary | ICD-10-CM | POA: Diagnosis present

## 2016-03-15 DIAGNOSIS — I2510 Atherosclerotic heart disease of native coronary artery without angina pectoris: Secondary | ICD-10-CM

## 2016-03-15 HISTORY — PX: CARDIAC CATHETERIZATION: SHX172

## 2016-03-15 LAB — BASIC METABOLIC PANEL
Anion gap: 9 (ref 5–15)
BUN: 21 mg/dL — ABNORMAL HIGH (ref 6–20)
CO2: 22 mmol/L (ref 22–32)
Calcium: 9.6 mg/dL (ref 8.9–10.3)
Chloride: 106 mmol/L (ref 101–111)
Creatinine, Ser: 1.23 mg/dL — ABNORMAL HIGH (ref 0.44–1.00)
GFR calc Af Amer: 49 mL/min — ABNORMAL LOW (ref >60)
GFR calc non Af Amer: 42 mL/min — ABNORMAL LOW (ref >60)
Glucose, Bld: 94 mg/dL (ref 65–99)
Potassium: 4.4 mmol/L (ref 3.5–5.1)
Sodium: 137 mmol/L (ref 135–145)

## 2016-03-15 LAB — PROTIME-INR
INR: 0.99
Prothrombin Time: 13.1 seconds (ref 11.4–15.2)

## 2016-03-15 LAB — CBC
HCT: 40.5 % (ref 36.0–46.0)
Hemoglobin: 13.1 g/dL (ref 12.0–15.0)
MCH: 29.4 pg (ref 26.0–34.0)
MCHC: 32.3 g/dL (ref 30.0–36.0)
MCV: 90.8 fL (ref 78.0–100.0)
Platelets: 290 10*3/uL (ref 150–400)
RBC: 4.46 MIL/uL (ref 3.87–5.11)
RDW: 13.2 % (ref 11.5–15.5)
WBC: 10.3 10*3/uL (ref 4.0–10.5)

## 2016-03-15 LAB — APTT: aPTT: 57 seconds — ABNORMAL HIGH (ref 24–36)

## 2016-03-15 LAB — POCT ACTIVATED CLOTTING TIME: Activated Clotting Time: 489 seconds

## 2016-03-15 LAB — HEPARIN LEVEL (UNFRACTIONATED): Heparin Unfractionated: 0.5 IU/mL (ref 0.30–0.70)

## 2016-03-15 SURGERY — LEFT HEART CATH AND CORONARY ANGIOGRAPHY
Anesthesia: LOCAL

## 2016-03-15 MED ORDER — HEPARIN (PORCINE) IN NACL 2-0.9 UNIT/ML-% IJ SOLN
INTRAMUSCULAR | Status: AC
  Filled 2016-03-15: qty 1000

## 2016-03-15 MED ORDER — BIVALIRUDIN BOLUS VIA INFUSION - CUPID
INTRAVENOUS | Status: DC | PRN
  Administered 2016-03-15: 46.2 mg via INTRAVENOUS

## 2016-03-15 MED ORDER — IOPAMIDOL (ISOVUE-370) INJECTION 76%
INTRAVENOUS | Status: DC | PRN
  Administered 2016-03-15: 165 mL via INTRA_ARTERIAL

## 2016-03-15 MED ORDER — HEART ATTACK BOUNCING BOOK
Freq: Once | Status: AC
  Administered 2016-03-15: 21:00:00
  Filled 2016-03-15: qty 1

## 2016-03-15 MED ORDER — VERAPAMIL HCL 2.5 MG/ML IV SOLN
INTRAVENOUS | Status: AC
  Filled 2016-03-15: qty 2

## 2016-03-15 MED ORDER — ANGIOPLASTY BOOK
Freq: Once | Status: AC
  Administered 2016-03-15: 18:00:00
  Filled 2016-03-15: qty 1

## 2016-03-15 MED ORDER — SODIUM CHLORIDE 0.9 % IV SOLN: INTRAVENOUS | Status: AC

## 2016-03-15 MED ORDER — HEPARIN SODIUM (PORCINE) 5000 UNIT/ML IJ SOLN: 5000.0000 [IU] | Freq: Three times a day (TID) | INTRAMUSCULAR | Status: AC

## 2016-03-15 MED ORDER — HEPARIN SODIUM (PORCINE) 5000 UNIT/ML IJ SOLN: 5000.0000 [IU] | Freq: Three times a day (TID) | INTRAMUSCULAR | Status: DC

## 2016-03-15 MED ORDER — LABETALOL HCL 5 MG/ML IV SOLN: 10.0000 mg | INTRAVENOUS | Status: AC | PRN

## 2016-03-15 MED ORDER — HYDRALAZINE HCL 20 MG/ML IJ SOLN: 5.0000 mg | INTRAMUSCULAR | Status: AC | PRN

## 2016-03-15 MED ORDER — ASPIRIN 81 MG PO CHEW
81.0000 mg | CHEWABLE_TABLET | Freq: Every day | ORAL | Status: DC
  Administered 2016-03-16: 81 mg via ORAL
  Filled 2016-03-15: qty 1

## 2016-03-15 MED ORDER — IOPAMIDOL (ISOVUE-370) INJECTION 76%
INTRAVENOUS | Status: AC
  Filled 2016-03-15: qty 100

## 2016-03-15 MED ORDER — NITROGLYCERIN 1 MG/10 ML FOR IR/CATH LAB
INTRA_ARTERIAL | Status: AC
  Filled 2016-03-15: qty 10

## 2016-03-15 MED ORDER — TICAGRELOR 90 MG PO TABS
ORAL_TABLET | ORAL | Status: DC | PRN
  Administered 2016-03-15: 180 mg via ORAL

## 2016-03-15 MED ORDER — BIVALIRUDIN 250 MG IV SOLR
INTRAVENOUS | Status: AC
  Filled 2016-03-15: qty 250

## 2016-03-15 MED ORDER — SODIUM CHLORIDE 0.9% FLUSH: 3.0000 mL | INTRAVENOUS | Status: DC | PRN

## 2016-03-15 MED ORDER — SODIUM CHLORIDE 0.9 % IV SOLN
INTRAVENOUS | Status: DC | PRN
  Administered 2016-03-15: 1.75 mg/kg/h via INTRAVENOUS

## 2016-03-15 MED ORDER — HEPARIN SODIUM (PORCINE) 1000 UNIT/ML IJ SOLN
INTRAMUSCULAR | Status: AC
  Filled 2016-03-15: qty 1

## 2016-03-15 MED ORDER — LIDOCAINE HCL (PF) 1 % IJ SOLN
INTRAMUSCULAR | Status: AC
  Filled 2016-03-15: qty 30

## 2016-03-15 MED ORDER — ACETAMINOPHEN 325 MG PO TABS
650.0000 mg | ORAL_TABLET | ORAL | Status: DC | PRN
  Administered 2016-03-16: 650 mg via ORAL
  Filled 2016-03-15: qty 2

## 2016-03-15 MED ORDER — FENTANYL CITRATE (PF) 100 MCG/2ML IJ SOLN
INTRAMUSCULAR | Status: DC | PRN
  Administered 2016-03-15: 50 ug via INTRAVENOUS

## 2016-03-15 MED ORDER — ONDANSETRON HCL 4 MG/2ML IJ SOLN
4.0000 mg | Freq: Four times a day (QID) | INTRAMUSCULAR | Status: DC | PRN
  Administered 2016-03-15: 4 mg via INTRAVENOUS
  Filled 2016-03-15: qty 2

## 2016-03-15 MED ORDER — TICAGRELOR 90 MG PO TABS
ORAL_TABLET | ORAL | Status: AC
  Filled 2016-03-15: qty 2

## 2016-03-15 MED ORDER — OXYCODONE-ACETAMINOPHEN 5-325 MG PO TABS: 1.0000 | ORAL_TABLET | ORAL | Status: DC | PRN

## 2016-03-15 MED ORDER — MIDAZOLAM HCL 2 MG/2ML IJ SOLN
INTRAMUSCULAR | Status: AC
  Filled 2016-03-15: qty 2

## 2016-03-15 MED ORDER — SODIUM CHLORIDE 0.9% FLUSH
3.0000 mL | Freq: Two times a day (BID) | INTRAVENOUS | Status: DC
  Administered 2016-03-15: 3 mL via INTRAVENOUS

## 2016-03-15 MED ORDER — TICAGRELOR 90 MG PO TABS
90.0000 mg | ORAL_TABLET | Freq: Two times a day (BID) | ORAL | Status: DC
  Administered 2016-03-15 – 2016-03-16 (×2): 90 mg via ORAL
  Filled 2016-03-15 (×2): qty 1

## 2016-03-15 MED ORDER — SODIUM CHLORIDE 0.9 % IV SOLN: 250.0000 mL | INTRAVENOUS | Status: DC | PRN

## 2016-03-15 MED ORDER — HEPARIN (PORCINE) IN NACL 2-0.9 UNIT/ML-% IJ SOLN
INTRAMUSCULAR | Status: DC | PRN
  Administered 2016-03-15: 1000 mL via INTRA_ARTERIAL

## 2016-03-15 MED ORDER — LIDOCAINE HCL (PF) 1 % IJ SOLN
INTRAMUSCULAR | Status: DC | PRN
  Administered 2016-03-15: 2 mL via INTRADERMAL

## 2016-03-15 MED ORDER — APIXABAN 5 MG PO TABS
5.0000 mg | ORAL_TABLET | Freq: Two times a day (BID) | ORAL | Status: DC
  Administered 2016-03-16: 09:00:00 5 mg via ORAL
  Filled 2016-03-15: qty 1

## 2016-03-15 MED ORDER — MIDAZOLAM HCL 2 MG/2ML IJ SOLN
INTRAMUSCULAR | Status: DC | PRN
  Administered 2016-03-15 (×2): 1 mg via INTRAVENOUS

## 2016-03-15 MED ORDER — HEPARIN (PORCINE) IN NACL 2-0.9 UNIT/ML-% IJ SOLN
INTRAMUSCULAR | Status: DC | PRN
  Administered 2016-03-15: 11:00:00 via INTRA_ARTERIAL

## 2016-03-15 MED ORDER — FENTANYL CITRATE (PF) 100 MCG/2ML IJ SOLN
INTRAMUSCULAR | Status: AC
  Filled 2016-03-15: qty 2

## 2016-03-15 MED ORDER — NITROGLYCERIN 1 MG/10 ML FOR IR/CATH LAB
INTRA_ARTERIAL | Status: DC | PRN
  Administered 2016-03-15: 200 ug via INTRACORONARY

## 2016-03-15 MED ORDER — HEPARIN SODIUM (PORCINE) 1000 UNIT/ML IJ SOLN
INTRAMUSCULAR | Status: DC | PRN
  Administered 2016-03-15: 3500 [IU] via INTRAVENOUS

## 2016-03-15 SURGICAL SUPPLY — 16 items
BALLN EMERGE MR 2.5X12 (BALLOONS) ×2
BALLOON EMERGE MR 2.5X12 (BALLOONS) ×1 IMPLANT
CATH INFINITI 5FR JL4 (CATHETERS) ×2 IMPLANT
CATH INFINITI JR4 5F (CATHETERS) ×2 IMPLANT
DEVICE RAD COMP TR BAND LRG (VASCULAR PRODUCTS) ×2 IMPLANT
GLIDESHEATH SLEND A-KIT 6F 22G (SHEATH) ×2 IMPLANT
GUIDE CATH RUNWAY 6FR CLS3 (CATHETERS) ×2 IMPLANT
GUIDEWIRE INQWIRE 1.5J.035X260 (WIRE) ×1 IMPLANT
INQWIRE 1.5J .035X260CM (WIRE) ×2
KIT ENCORE 26 ADVANTAGE (KITS) ×2 IMPLANT
KIT HEART LEFT (KITS) ×2 IMPLANT
PACK CARDIAC CATHETERIZATION (CUSTOM PROCEDURE TRAY) ×2 IMPLANT
STENT RESOLUTE ONYX 2.75X12 (Permanent Stent) ×2 IMPLANT
TRANSDUCER W/STOPCOCK (MISCELLANEOUS) ×2 IMPLANT
TUBING CIL FLEX 10 FLL-RA (TUBING) ×2 IMPLANT
WIRE ASAHI PROWATER 180CM (WIRE) ×2 IMPLANT

## 2016-03-15 NOTE — Interval H&P Note (Signed)
Cath Lab Visit (complete for each Cath Lab visit)  Clinical Evaluation Leading to the Procedure:   ACS: Yes.    Non-ACS:    Anginal Classification: CCS IV  Anti-ischemic medical therapy: No Therapy  Non-Invasive Test Results: No non-invasive testing performed  Prior CABG: No previous CABG      History and Physical Interval Note:  03/15/2016 11:05 AM  Patricia Jackson  has presented today for surgery, with the diagnosis of cp  The various methods of treatment have been discussed with the patient and family. After consideration of risks, benefits and other options for treatment, the patient has consented to  Procedure(s): Left Heart Cath and Coronary Angiography (N/A) as a surgical intervention .  The patient's history has been reviewed, patient examined, no change in status, stable for surgery.  I have reviewed the patient's chart and labs.  Questions were answered to the patient's satisfaction.     Belva Crome III

## 2016-03-15 NOTE — Progress Notes (Signed)
Patient not returning to [facility]. Took 3 patient belonging bags to cath lab. Clothes, glasses, and coat in bag.

## 2016-03-15 NOTE — Progress Notes (Signed)
TR BAND REMOVAL  LOCATION:    right radial  DEFLATED PER PROTOCOL:    Yes.    TIME BAND OFF / DRESSING APPLIED:    19:30   SITE UPON ARRIVAL:    Level 0  SITE AFTER BAND REMOVAL:    Level 0  CIRCULATION SENSATION AND MOVEMENT:    Within Normal Limits   Yes.    COMMENTS:   Post TR band instructions given. Pt tolerated well.

## 2016-03-15 NOTE — H&P (View-Only) (Signed)
   The patient is scheduled for cardiac catheterization this morning first case.  Daryel November, MD

## 2016-03-15 NOTE — Progress Notes (Signed)
   The patient is scheduled for cardiac catheterization this morning first case.  Daryel November, MD

## 2016-03-15 NOTE — Care Management Note (Signed)
Case Management Note  Patient Details  Name: Patricia Jackson MRN: [id number] Date of Birth: 1940-05-03  Subjective/Objective:   Presents with chest pain to [facility], found to have NSTEMI with elevated trops and   EKG showed new inferior lateral ST depression .  Patient for cath today , in cath lab.               Action/Plan:   Expected Discharge Date:                  Expected Discharge Plan:  Home/Self Care  In-House Referral:     Discharge planning Services  CM Consult  Post Acute Care Choice:    Choice offered to:     DME Arranged:    DME Agency:     HH Arranged:    HH Agency:     Status of Service:  In process, will continue to follow  If discussed at Long Length of Stay Meetings, dates discussed:    Additional Comments:  Zenon Mayo, RN 03/15/2016, 12:17 PM

## 2016-03-15 NOTE — Progress Notes (Signed)
ANTICOAGULATION CONSULT NOTE - Follow Up Consult  Pharmacy Consult for Heparin (Apixaban on hold) Indication: NSTEMI  Patient Measurements: Height: 5\' 7"  (170.2 cm) Weight: 135 lb 12.9 oz (61.6 kg) IBW/kg (Calculated) : 61.6  Vital Signs: Temp: 97.4 F (36.3 C) (12/11 0811) Temp Source: Oral (12/11 0811) BP: 129/72 (12/11 0811) Pulse Rate: 51 (12/11 0811)  Labs:  Recent Labs  03/12/16 1521 03/12/16 2251  03/13/16 0324 03/13/16 0914 03/14/16 0422 03/15/16 0332  HGB  --   --   < > 11.3*  --  11.9* 13.1  HCT  --   --   --  35.2*  --  36.2 40.5  PLT  --   --   --  265  --  261 290  APTT  --   --   < > 91* 82* 67* 57*  LABPROT  --   --   --   --   --   --  13.1  INR  --   --   --   --   --   --  0.99  HEPARINUNFRC  --   --   --  1.62*  --  0.76* 0.50  CREATININE  --   --   --  1.35*  --   --  1.23*  TROPONINI 0.76* 0.91*  --  0.88*  --   --   --   < > = values in this interval not displayed.  Estimated Creatinine Clearance: 39.6 mL/min (by C-G formula based on SCr of 1.23 mg/dL (H)).   Assessment: Heparin for NSTEMI, using aPTT to dose for now given apixaban potential to influence anti-Xa levels. PTT slightly low this AM, heparin level within goal range, but a bit unclear if still falsely elevated from previous apixaban.  Patient going to cath lab now.  Goal of Therapy:  Heparin level 0.3-0.7 units/ml aPTT 66-102 seconds Monitor platelets by anticoagulation protocol: Yes   Plan:  -F/u plans for heparin after cath lab today.  Uvaldo Rising, BCPS  Clinical Pharmacist Pager [phone]  03/15/2016 10:25 AM

## 2016-03-15 NOTE — Progress Notes (Signed)
S/W Jaimya  @ [employer] # [phone]   BRILINTA 90 MG BID 30 /60 TAB   COVER- YES  CO-PAY- $ 45.00  TIER- 3 DRUG  PRIOR APPROVAL- NO  PHARMACY : [facility], [facility]

## 2016-03-16 ENCOUNTER — Encounter (HOSPITAL_COMMUNITY): Payer: Self-pay | Admitting: Cardiology

## 2016-03-16 DIAGNOSIS — Z95820 Peripheral vascular angioplasty status with implants and grafts: Secondary | ICD-10-CM

## 2016-03-16 DIAGNOSIS — I2510 Atherosclerotic heart disease of native coronary artery without angina pectoris: Secondary | ICD-10-CM | POA: Diagnosis present

## 2016-03-16 HISTORY — DX: Peripheral vascular angioplasty status with implants and grafts: Z95.820

## 2016-03-16 HISTORY — DX: Atherosclerotic heart disease of native coronary artery without angina pectoris: I25.10

## 2016-03-16 LAB — BASIC METABOLIC PANEL
Anion gap: 6 (ref 5–15)
BUN: 30 mg/dL — ABNORMAL HIGH (ref 6–20)
CO2: 22 mmol/L (ref 22–32)
Calcium: 9.5 mg/dL (ref 8.9–10.3)
Chloride: 107 mmol/L (ref 101–111)
Creatinine, Ser: 1.49 mg/dL — ABNORMAL HIGH (ref 0.44–1.00)
GFR calc Af Amer: 39 mL/min — ABNORMAL LOW (ref >60)
GFR calc non Af Amer: 34 mL/min — ABNORMAL LOW (ref >60)
Glucose, Bld: 99 mg/dL (ref 65–99)
Potassium: 5 mmol/L (ref 3.5–5.1)
Sodium: 135 mmol/L (ref 135–145)

## 2016-03-16 LAB — CBC
HCT: 39.4 % (ref 36.0–46.0)
Hemoglobin: 12.6 g/dL (ref 12.0–15.0)
MCH: 29.1 pg (ref 26.0–34.0)
MCHC: 32 g/dL (ref 30.0–36.0)
MCV: 91 fL (ref 78.0–100.0)
Platelets: 280 10*3/uL (ref 150–400)
RBC: 4.33 MIL/uL (ref 3.87–5.11)
RDW: 13.7 % (ref 11.5–15.5)
WBC: 9.3 10*3/uL (ref 4.0–10.5)

## 2016-03-16 LAB — HEPARIN LEVEL (UNFRACTIONATED): Heparin Unfractionated: 0.2 IU/mL — ABNORMAL LOW (ref 0.30–0.70)

## 2016-03-16 LAB — APTT: aPTT: 34 seconds (ref 24–36)

## 2016-03-16 MED ORDER — NITROGLYCERIN 0.4 MG SL SUBL: 0.4000 mg | SUBLINGUAL_TABLET | Freq: Once | SUBLINGUAL | 4 refills | Status: DC

## 2016-03-16 MED ORDER — AMLODIPINE BESYLATE 5 MG PO TABS: 5.0000 mg | ORAL_TABLET | Freq: Every day | ORAL | 6 refills | Status: DC

## 2016-03-16 MED ORDER — METOPROLOL SUCCINATE ER 100 MG PO TB24: 100.0000 mg | ORAL_TABLET | Freq: Every day | ORAL | 6 refills | Status: DC

## 2016-03-16 MED ORDER — TICAGRELOR 90 MG PO TABS: 90.0000 mg | ORAL_TABLET | Freq: Two times a day (BID) | ORAL | 4 refills | Status: DC

## 2016-03-16 MED ORDER — AMLODIPINE BESYLATE 5 MG PO TABS
5.0000 mg | ORAL_TABLET | Freq: Every day | ORAL | Status: DC
Start: 2016-03-16 — End: 2016-03-16
  Administered 2016-03-16: 09:00:00 5 mg via ORAL
  Filled 2016-03-16: qty 1

## 2016-03-16 MED ORDER — ASPIRIN 81 MG PO CHEW: 81.0000 mg | CHEWABLE_TABLET | Freq: Every day | ORAL | Status: DC

## 2016-03-16 MED ORDER — TICAGRELOR 90 MG PO TABS: 90.0000 mg | ORAL_TABLET | Freq: Two times a day (BID) | ORAL | 0 refills | Status: DC

## 2016-03-16 MED ORDER — ACETAMINOPHEN 325 MG PO TABS: 650.0000 mg | ORAL_TABLET | ORAL | Status: DC | PRN

## 2016-03-16 NOTE — Care Management Important Message (Signed)
Important Message  Patient Details  Name: Patricia Jackson MRN: [id number] Date of Birth: [birth year]   Medicare Important Message Given:  Yes    Sheika Coutts Montine Circle 03/16/2016, 12:24 PM

## 2016-03-16 NOTE — Care Management Note (Signed)
Case Management Note  Patient Details  Name: Patricia Jackson MRN: [id number] Date of Birth: 08/28/1940  Subjective/Objective:   S/p intervention, pta indep, lives with son and her wife,  She will be on brilinta, NCM gave her the 30 day savings card and she will go to [place] to get the 30 day free and they have it in stock.  Her co pay is 45.00.                  Action/Plan:   Expected Discharge Date:                  Expected Discharge Plan:  Home/Self Care  In-House Referral:     Discharge planning Services  CM Consult  Post Acute Care Choice:    Choice offered to:     DME Arranged:    DME Agency:     HH Arranged:    HH Agency:     Status of Service:  Completed, signed off  If discussed at [employer] of Stay Meetings, dates discussed:    Additional Comments:  Zenon Mayo, RN 03/16/2016, 9:29 AM

## 2016-03-16 NOTE — Discharge Instructions (Signed)
Call [facility] at [phone] if any bleeding, swelling or drainage at cath site.  May shower, no tub baths for 48 hours for groin sticks. No lifting over 5 pounds for 5 days.  No Driving for 5 days.  Take 1 NTG, under your tongue, while sitting.  If no relief of pain may repeat NTG, one tab every 5 minutes up to 3 tablets total over 15 minutes.  If no relief CALL 911.  If you have dizziness/lightheadness  while taking NTG, stop taking and call 911.        Heart Healthy diet  You are on Eliquis, Brilinta and 81 mg asprin.  If you have dark stools, or bloody stools please call the office or if severe go to ER.  After 1 month we will plan to stop asprin.

## 2016-03-16 NOTE — Discharge Summary (Addendum)
Discharge Summary    Patient ID: Patricia Jackson,  MRN: [id number], DOB/AGE: [birth year] 40 y.o.  Admit date: 03/12/2016 Discharge date: 03/16/2016  Primary Care Provider: Wende Neighbors Primary Cardiologist: Dr. Harl Bowie  Discharge Diagnoses    Principal Problem:   NSTEMI (non-ST elevated myocardial infarction) [facility]) Active Problems:   CAD in native artery   S/P angioplasty with stent 03/15/16 DES Resolute, pLCX   Hypertension   Atrial flutter with rapid ventricular response (HCC)   COPD exacerbation (HCC)   Dyslipidemia   Acute coronary syndrome (HCC)   Allergies Allergies  Allergen Reactions  . Sulfa Antibiotics Hives    Diagnostic Studies/Procedures    03/15/16 Dr. Tamala Julian  Procedures   Coronary Stent Intervention  Left Heart Cath and Coronary Angiography  Conclusion     The left ventricular ejection fraction is 55-65% by visual estimate.  The left ventricular systolic function is normal.  A STENT RESOLUTE ONYX 2.75X12 drug eluting stent was successfully placed, and does not overlap previously placed stent.  Prox Cx to Mid Cx lesion, 99 %stenosed.  Post intervention, there is a 0% residual stenosis.    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  Otherwise widely patent coronary arteries  Successful angioplasty and stenting of the proximal circumflex reducing a greater than 95% stenosis to 0% with TIMI grade 3 flow. Resolute Onyx 2.75 x 12 mm stent deployed at 14 atm.  Recommendations:   Aspirin, Brilinta, and Eliquis x 1 month then drop aspirin. See further instruction below for 3 months and beyond.  Eliquis should start in AM as long as no recurrent AF/Afl.  Would also consider switch to Plavix at 3 months to decrease the risk of bleeding on Eliquis.   Indications   NSTEMI (non-ST elevated myocardial infarction) ([place]) [I21.4 (ICD-10-CM)]    _____________   History of Present Illness     75 yo female  with PMH of HTN, Atrial flutter, tobacco use, COPD and Breast Ca who presented to [facility] with upper back pain between her shoulder blades and radiation into her arms.  In the ED her initial EKG showed new inferior lateral ST depression not noted on previous EKGs. She was given morphine and SL nitro with relief of pain. Follow up EKG showed improvement of ST depression, until mostly resolved. Labs noted stable electrolytes, Hgb 12.1, trop 0.19>>0.31. Cr 1.3. Cardiology was called for admission and transfer to [facility].  She was then admitted starting IV heparin and holding Eliquis.  Hospital Course     Consultants: NONE  She was monitored over the weekend and was stable though one other episode of chest pain and rapid response was called, NTG with relief. Troponin pk of 0.91.   She underwent cath and PCI 03/15/16 with results as above.  On day of discharge she has no pain or SOB.  No lightheadedness.  One episode of HR to 40 then back to SR SB lowest 52.    EKG today SB at 52 with improved ST depression in her lateral leads.   She will ambulate with cardiac rehab and if stable will be discharged home.  She will be on  Eliquis, Brilinta, asa and after 1 month stop the asprin.  She is on BB and statin with LDL of 76. EF 55-65%. Her dilt was stopped and she is now on amlodipine for HTN.   BP is elevated this AM. Not yet had AM meds.  Pt seen and evaluated by Dr.  Ron Parker and found stable for discharge home.  _____________  Discharge Vitals Blood pressure (!) 146/73, pulse 61, temperature 97.2 F (36.2 C), temperature source Axillary, resp. rate 18, height 5' 7"  (1.702 m), weight 136 lb 0.4 oz (61.7 kg), SpO2 97 %.  General:Pleasant affect, NAD Skin:Warm and dry, brisk capillary refill HEENT:normocephalic, sclera clear, mucus membranes moist Neck:supple, no JVD Heart:S1S2 RRR without murmur, gallup, rub or click Lungs:clear without rales, rhonchi, or wheezes KYH:CWCB, non tender, + BS, do not  palpate liver spleen or masses Ext:no lower ext edema, 2+ pedal pulses, 2+ radial pulses, rt radial cath site without bleeding. Neuro:alert and oriented X 3, MAE, follows commands, + facial symmetry     Filed Weights   03/12/16 1300 03/14/16 2023 03/16/16 0524  Weight: 125 lb (56.7 kg) 135 lb 12.9 oz (61.6 kg) 136 lb 0.4 oz (61.7 kg)    Labs & Radiologic Studies    CBC  Recent Labs  03/15/16 0332 03/16/16 0220  WBC 10.3 9.3  HGB 13.1 12.6  HCT 40.5 39.4  MCV 90.8 91.0  PLT 290 762   Basic Metabolic Panel  Recent Labs  03/15/16 0332 03/16/16 0220  NA 137 135  K 4.4 5.0  CL 106 107  CO2 22 22  GLUCOSE 94 99  BUN 21* 30*  CREATININE 1.23* 1.49*  CALCIUM 9.6 9.5   Liver Function Tests  Hepatic Function Latest Ref Rng & Units 03/12/2016 11/02/2015 10/30/2015  Total Protein 6.5 - 8.1 g/dL 7.4 - 5.4(L)  Albumin 3.5 - 5.0 g/dL 3.7 2.7(L) 2.4(L)  AST 15 - 41 U/L 38 - 69(H)  ALT 14 - 54 U/L 28 - 44  Alk Phosphatase 38 - 126 U/L 83 - 191(H)  Total Bilirubin 0.3 - 1.2 mg/dL 0.4 - 0.5  Bilirubin, Direct 0.1 - 0.5 mg/dL - - 0.2    Cardiac Enzymes pk troponin 0.91  BNP Invalid input(s): POCBNP D-Dimer No results for input(s): DDIMER in the last 72 hours. Hemoglobin A1C No results for input(s): HGBA1C in the last 72 hours. Fasting Lipid Panel Lipid Panel     Component Value Date/Time   CHOL 159 03/13/2016 0324   TRIG 63 03/13/2016 0324   HDL 70 03/13/2016 0324   CHOLHDL 2.3 03/13/2016 0324   VLDL 13 03/13/2016 0324   LDLCALC 76 03/13/2016 0324   Thyroid Function Tests No results for input(s): TSH, T4TOTAL, T3FREE, THYROIDAB in the last 72 hours.  Invalid input(s): FREET3 _____________  Dg Chest Portable 1 View  Result Date: 03/12/2016 CLINICAL DATA:  Upper back pain.  Acute coronary syndrome EXAM: PORTABLE CHEST 1 VIEW COMPARISON:  12/03/2015 FINDINGS: There is hyperinflation of the lungs compatible with COPD. Heart is borderline in size. No confluent  opacities, effusions or acute bony abnormality. IMPRESSION: COPD.  No active disease. Electronically Signed   By: Rolm Baptise M.D.   On: 03/12/2016 08:52   Disposition   Pt is being discharged home today in good condition.  Follow-up Plans & Appointments   Call [facility] at [phone] if any bleeding, swelling or drainage at cath site.  May shower, no tub baths for 48 hours for groin sticks. No lifting over 5 pounds for 5 days.  No Driving for 5 days.  Take 1 NTG, under your tongue, while sitting.  If no relief of pain may repeat NTG, one tab every 5 minutes up to 3 tablets total over 15 minutes.  If no relief CALL 911.  If you have  dizziness/lightheadness  while taking NTG, stop taking and call 911.        Heart Healthy diet  You are on Eliquis, Brilinta and 81 mg asprin.  If you have dark stools, or bloody stools please call the office or if severe go to ER.  After 1 month we will plan to stop asprin.     Follow-up Information    Carlyle Dolly, MD Follow up.   Specialty:  Cardiology Why:  the office will call you with the date and time.   Contact information: [address] [place] [address] [phone]          Discharge Instructions    Amb Referral to Cardiac Rehabilitation    Complete by:  As directed    Diagnosis:   NSTEMI Coronary Stents        Discharge Medications   Current Discharge Medication List    START taking these medications   Details  acetaminophen (TYLENOL) 325 MG tablet Take 2 tablets (650 mg total) by mouth every 4 (four) hours as needed for headache or mild pain.    amLODipine (NORVASC) 5 MG tablet Take 1 tablet (5 mg total) by mouth daily. Qty: 30 tablet, Refills: 6    aspirin 81 MG chewable tablet Chew 1 tablet (81 mg total) by mouth daily.    metoprolol succinate (TOPROL-XL) 100 MG 24 hr tablet Take 1 tablet (100 mg total) by mouth daily. Take with or immediately following a meal. Qty: 30 tablet,  Refills: 6    nitroGLYCERIN (NITROSTAT) 0.4 MG SL tablet Place 1 tablet (0.4 mg total) under the tongue once. Qty: 25 tablet, Refills: 4    ticagrelor (BRILINTA) 90 MG TABS tablet Take 1 tablet (90 mg total) by mouth 2 (two) times daily. Qty: 180 tablet, Refills: 4      CONTINUE these medications which have NOT CHANGED   Details  amiodarone (PACERONE) 200 MG tablet Take 1 tablet (200 mg total) by mouth daily. Qty: 30 tablet, Refills: 5    apixaban (ELIQUIS) 5 MG TABS tablet Take 1 tablet (5 mg total) by mouth 2 (two) times daily. Qty: 60 tablet, Refills: 6    buPROPion (WELLBUTRIN XL) 300 MG 24 hr tablet Take 1 tablet by mouth daily.    levocetirizine (XYZAL) 5 MG tablet Take 5 mg by mouth every evening.    simvastatin (ZOCOR) 20 MG tablet Take 20 mg by mouth daily at 6 PM.     SYMBICORT 160-4.5 MCG/ACT inhaler Inhale 1 puff into the lungs daily. Qty: 1 Inhaler, Refills: 5    albuterol (PROVENTIL) (2.5 MG/3ML) 0.083% nebulizer solution Take 3 mLs (2.5 mg total) by nebulization every 6 (six) hours as needed for wheezing or shortness of breath. Qty: 75 mL, Refills: 12    meclizine (ANTIVERT) 25 MG tablet TAKE ONE TABLET BY MOUTH THREE TIMES DAILY AS NEEDED FOR DIZZINESS Qty: 30 tablet, Refills: 0      STOP taking these medications     diltiazem (CARDIZEM CD) 360 MG 24 hr capsule      doxycycline (VIBRA-TABS) 100 MG tablet      predniSONE (DELTASONE) 10 MG tablet          Aspirin prescribed at discharge?  Yes High Intensity Statin Prescribed? (Lipitor 40-41m or Crestor 20-467m: No: non stemi and LDL of 76 Beta Blocker Prescribed? Yes For EF <40%, was ACEI/ARB Prescribed? No: NA ADP Receptor Inhibitor Prescribed? (i.e. Plavix etc.-Includes Medically Managed Patients): Yes For EF <40%, Aldosterone Inhibitor  Prescribed? No: na Was EF assessed during THIS hospitalization? Yes Was Cardiac Rehab II ordered? (Included Medically managed Patients): Yes   Outstanding  Labs/Studies   Would recommend checking BMP in OV with mild rise in Cr. Post cath.     Duration of Discharge Encounter   Greater than 30 minutes including physician time.  Signed, Cecilie Kicks NP 03/16/2016, 11:25 AM  Patient seen and examined. I agree with the assessment and plan as detailed above. See also my additional thoughts below.   The patient is stable today after PCI yesterday. I carefully reviewed all the information in the note above. I agree with the discharge planning. The patient is on triple drug therapy including ASA, Brilinta, Eliquis. The aspirin will be tapered off after several weeks as an outpatient. I am particularly concerned about elevated blood pressure in the morning. The beta blocker is new. The patient is on amiodarone. There is resting bradycardia. I feel it is most appropriate to change diltiazem to amlodipine today for better blood pressure control. She needs early post hospital follow-up of her blood pressure.  Daryel November MD, [facility]

## 2016-03-16 NOTE — Progress Notes (Signed)
CARDIAC REHAB PHASE I   PRE:  Rate/Rhythm: 59 SB  BP:  Supine: 140/55  Sitting:   Standing:    SaO2:   MODE:  Ambulation: 450 ft   POST:  Rate/Rhythm: 82 SR  BP:  Supine: 148/58  Sitting:   Standing:    SaO2: 94-100%RA 6144-3154 Pt walked 450 ft with steady gait but had to stop 4 times to rest due to SOB. Encouraged pursed lip breathing. Hard to determine speaking with pt if this is new or if she has SOB with activity. She stated that she mainly walks in the house and does not walk for exercise. No CP or shoulder pain. MI education completed with pt who voiced understanding. Reviewed NTG use, heart healthy diet, MI restrictions and modified ex ed. Stressed importance of brilinta with stent. Will need to see case manager as she does not have brilinta card yet. Discussed smoking cessation. Congratulated pt as she has not smoked since July 26.  Discussed CRP 2 and will refer to [place].   Graylon Good, RN BSN  03/16/2016 9:01 AM

## 2016-03-31 ENCOUNTER — Telehealth: Payer: Self-pay | Admitting: Cardiology

## 2016-03-31 NOTE — Telephone Encounter (Signed)
Patient is asking if it is okay for her to travel to [place] post stent placement on 12/11. / tg

## 2016-03-31 NOTE — Telephone Encounter (Signed)
Pt and son notified that pt may travel. Son instructed to stop every 2 hours for rest breaks for the pt. Son voiced understanding.

## 2016-04-03 ENCOUNTER — Emergency Department (HOSPITAL_COMMUNITY)
Admission: EM | Admit: 2016-04-03 | Discharge: 2016-04-03 | Disposition: A | Discharge status: Home / Self Care | Payer: Medicare Other | Attending: Emergency Medicine | Admitting: Emergency Medicine

## 2016-04-03 ENCOUNTER — Emergency Department (HOSPITAL_COMMUNITY): Payer: Medicare Other

## 2016-04-03 ENCOUNTER — Encounter (HOSPITAL_COMMUNITY): Payer: Self-pay | Admitting: *Deleted

## 2016-04-03 DIAGNOSIS — I2510 Atherosclerotic heart disease of native coronary artery without angina pectoris: Secondary | ICD-10-CM | POA: Insufficient documentation

## 2016-04-03 DIAGNOSIS — Z79899 Other long term (current) drug therapy: Secondary | ICD-10-CM | POA: Insufficient documentation

## 2016-04-03 DIAGNOSIS — I10 Essential (primary) hypertension: Secondary | ICD-10-CM | POA: Diagnosis not present

## 2016-04-03 DIAGNOSIS — Z87891 Personal history of nicotine dependence: Secondary | ICD-10-CM | POA: Diagnosis not present

## 2016-04-03 DIAGNOSIS — Z7982 Long term (current) use of aspirin: Secondary | ICD-10-CM | POA: Diagnosis not present

## 2016-04-03 DIAGNOSIS — J441 Chronic obstructive pulmonary disease with (acute) exacerbation: Secondary | ICD-10-CM

## 2016-04-03 DIAGNOSIS — R0602 Shortness of breath: Secondary | ICD-10-CM | POA: Diagnosis present

## 2016-04-03 LAB — COMPREHENSIVE METABOLIC PANEL
ALT: 25 U/L (ref 14–54)
AST: 35 U/L (ref 15–41)
Albumin: 3.3 g/dL — ABNORMAL LOW (ref 3.5–5.0)
Alkaline Phosphatase: 61 U/L (ref 38–126)
Anion gap: 7 (ref 5–15)
BUN: 43 mg/dL — ABNORMAL HIGH (ref 6–20)
CO2: 19 mmol/L — ABNORMAL LOW (ref 22–32)
Calcium: 8.8 mg/dL — ABNORMAL LOW (ref 8.9–10.3)
Chloride: 113 mmol/L — ABNORMAL HIGH (ref 101–111)
Creatinine, Ser: 1.55 mg/dL — ABNORMAL HIGH (ref 0.44–1.00)
GFR calc Af Amer: 37 mL/min — ABNORMAL LOW (ref >60)
GFR calc non Af Amer: 32 mL/min — ABNORMAL LOW (ref >60)
Glucose, Bld: 109 mg/dL — ABNORMAL HIGH (ref 65–99)
Potassium: 4.2 mmol/L (ref 3.5–5.1)
Sodium: 139 mmol/L (ref 135–145)
Total Bilirubin: 0.3 mg/dL (ref 0.3–1.2)
Total Protein: 6.6 g/dL (ref 6.5–8.1)

## 2016-04-03 LAB — CBC WITH DIFFERENTIAL/PLATELET
Basophils Absolute: 0 10*3/uL (ref 0.0–0.1)
Basophils Relative: 0 %
Eosinophils Absolute: 0.1 10*3/uL (ref 0.0–0.7)
Eosinophils Relative: 1 %
HCT: 26 % — ABNORMAL LOW (ref 36.0–46.0)
Hemoglobin: 8.4 g/dL — ABNORMAL LOW (ref 12.0–15.0)
Lymphocytes Relative: 11 %
Lymphs Abs: 1.4 10*3/uL (ref 0.7–4.0)
MCH: 30 pg (ref 26.0–34.0)
MCHC: 32.3 g/dL (ref 30.0–36.0)
MCV: 92.9 fL (ref 78.0–100.0)
Monocytes Absolute: 0.8 10*3/uL (ref 0.1–1.0)
Monocytes Relative: 6 %
Neutro Abs: 10.8 10*3/uL — ABNORMAL HIGH (ref 1.7–7.7)
Neutrophils Relative %: 82 %
Platelets: 291 10*3/uL (ref 150–400)
RBC: 2.8 MIL/uL — ABNORMAL LOW (ref 3.87–5.11)
RDW: 14.7 % (ref 11.5–15.5)
WBC: 13.2 10*3/uL — ABNORMAL HIGH (ref 4.0–10.5)

## 2016-04-03 LAB — I-STAT TROPONIN, ED
Troponin i, poc: 0 ng/mL (ref 0.00–0.08)
Troponin i, poc: 0 ng/mL (ref 0.00–0.08)

## 2016-04-03 LAB — BRAIN NATRIURETIC PEPTIDE: B Natriuretic Peptide: 177 pg/mL — ABNORMAL HIGH (ref 0.0–100.0)

## 2016-04-03 LAB — D-DIMER, QUANTITATIVE: D-Dimer, Quant: 0.41 ug/mL-FEU (ref 0.00–0.50)

## 2016-04-03 MED ORDER — ONDANSETRON HCL 4 MG/2ML IJ SOLN
4.0000 mg | Freq: Once | INTRAMUSCULAR | Status: AC
  Administered 2016-04-03: 4 mg via INTRAVENOUS
  Filled 2016-04-03: qty 2

## 2016-04-03 MED ORDER — AMOXICILLIN 500 MG PO CAPS: 500.0000 mg | ORAL_CAPSULE | Freq: Three times a day (TID) | ORAL | 0 refills | Status: DC

## 2016-04-03 MED ORDER — ONDANSETRON 4 MG PO TBDP: ORAL_TABLET | ORAL | 0 refills | Status: DC

## 2016-04-03 MED ORDER — HYDROCODONE-ACETAMINOPHEN 5-325 MG PO TABS: 1.0000 | ORAL_TABLET | Freq: Four times a day (QID) | ORAL | 0 refills | Status: DC | PRN

## 2016-04-03 MED ORDER — AZITHROMYCIN 250 MG PO TABS: 250.0000 mg | ORAL_TABLET | Freq: Every day | ORAL | 0 refills | Status: DC

## 2016-04-03 MED ORDER — IPRATROPIUM-ALBUTEROL 0.5-2.5 (3) MG/3ML IN SOLN
3.0000 mL | Freq: Once | RESPIRATORY_TRACT | Status: AC
  Administered 2016-04-03: 3 mL via RESPIRATORY_TRACT
  Filled 2016-04-03: qty 3

## 2016-04-03 MED ORDER — METHYLPREDNISOLONE SODIUM SUCC 125 MG IJ SOLR
125.0000 mg | Freq: Once | INTRAMUSCULAR | Status: AC
  Administered 2016-04-03: 125 mg via INTRAVENOUS
  Filled 2016-04-03: qty 2

## 2016-04-03 MED ORDER — LORAZEPAM 2 MG/ML IJ SOLN
0.5000 mg | Freq: Once | INTRAMUSCULAR | Status: AC
  Administered 2016-04-03: 0.5 mg via INTRAVENOUS
  Filled 2016-04-03: qty 1

## 2016-04-03 MED ORDER — PREDNISONE 20 MG PO TABS: ORAL_TABLET | ORAL | 0 refills | Status: DC

## 2016-04-03 MED ORDER — HYDROMORPHONE HCL 1 MG/ML IJ SOLN
0.5000 mg | Freq: Once | INTRAMUSCULAR | Status: AC
  Administered 2016-04-03: 0.5 mg via INTRAVENOUS
  Filled 2016-04-03: qty 1

## 2016-04-03 MED ORDER — AZITHROMYCIN 250 MG PO TABS: ORAL_TABLET | ORAL | 0 refills | Status: DC

## 2016-04-03 NOTE — ED Triage Notes (Signed)
Pt is here for shortness of breath which has been getting worse for the last 2 weeks.  Pt had cardiac cath with stent placement on 12/11.  Pt states that she has had a non productive cough with this.

## 2016-04-03 NOTE — ED Provider Notes (Signed)
[facility]-EMERGENCY DEPT Provider Note    By signing my name below, I, Bea Graff, attest that this documentation has been prepared under the direction and in the presence of Milton Ferguson, MD. Electronically Signed: Bea Graff, ED Scribe. 04/03/16. 2:04 PM.    History   Chief Complaint Chief Complaint  Patient presents with  . Shortness of Breath   The history is provided by the patient and medical records. No language interpreter was used.  Shortness of Breath  This is a recurrent problem. The average episode lasts 2 weeks. The problem occurs continuously.The current episode started more than 1 week ago. The problem has been gradually worsening. Associated symptoms include wheezing. Pertinent negatives include no fever, no headaches, no cough, no sputum production, no chest pain, no abdominal pain, no rash, no leg pain and no leg swelling. She has tried beta-agonist inhalers for the symptoms. The treatment provided no relief. She has had prior hospitalizations. She has had prior ED visits. Associated medical issues include COPD, CAD and past MI.    HPI Comments:  Patricia Jackson is a 75 y.o. female with PMHx of CAD, COPD who presents to the Emergency Department complaining of increased SOB that began about two weeks ago but worsened last night. She reports associated dry cough that began about one week ago but also worsened last night. Pt reports some associated back pain. She states these symptoms are similar to what they were when she had an MI. She reports using her MDI with no significant relief of her symptoms. There are no modifying factors noted. She denies extremity pain, nausea, vomiting. She reports having a stent placement almost three weeks ago on 03/15/16.   Past Medical History:  Diagnosis Date  . Breast cancer ([place])   . CAD in native artery 03/16/2016  . COPD (chronic obstructive pulmonary disease) ([place])   . Hypertension   . S/P angioplasty with stent 03/15/16  DES Resolute, pLCX 03/16/2016    Patient Active Problem List   Diagnosis Date Noted  . CAD in native artery 03/16/2016  . S/P angioplasty with stent 03/15/16 DES Resolute, [place] 03/16/2016  . Acute coronary syndrome ([place])   . NSTEMI (non-ST elevated myocardial infarction) ([place]) 03/12/2016  . Shortness of breath   . Acute respiratory failure with hypoxia ([place])   . Hypophosphatemia   . 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[place]) 10/31/2015  . Acute exacerbation of chronic bronchitis ([place]) 10/31/2015  . COPD exacerbation ([place]) 10/31/2015  . Dyslipidemia 10/31/2015  . Elevated troponin 10/31/2015  . Atrial flutter with rapid ventricular response ([place]) 10/30/2015  . SOB (shortness of breath) 11/26/2013  . Back pain 11/26/2013  . Arm pain 11/26/2013  . Chest pain 11/26/2013  . Hypertension     Past Surgical History:  Procedure Laterality Date  . APPENDECTOMY    . BREAST SURGERY    . CARDIAC CATHETERIZATION N/A 03/15/2016   Procedure: Left Heart Cath and Coronary Angiography;  Surgeon: Belva Crome, MD;  Location: [place] [facility];  Service: Cardiovascular;  Laterality: N/A;  . CARDIAC CATHETERIZATION N/A 03/15/2016   Procedure: Coronary Stent Intervention;  Surgeon: Belva Crome, MD;  Location: [place] [facility];  Service: Cardiovascular;  Laterality: N/A;    OB History    Gravida Para Term Preterm AB Living             1   SAB TAB Ectopic Multiple Live Births  Home Medications    Prior to Admission medications   Medication Sig Start Date End Date Taking? Authorizing Provider  acetaminophen (TYLENOL) 325 MG tablet Take 2 tablets (650 mg total) by mouth every 4 (four) hours as needed for headache or mild pain. 03/16/16   Isaiah Serge, NP  albuterol (PROVENTIL) (2.5 MG/3ML) 0.083% nebulizer solution Take 3 mLs (2.5 mg total) by nebulization every 6 (six) hours as needed for wheezing or shortness of breath. 11/08/15   Almyra Deforest, PA  amiodarone (PACERONE) 200 MG tablet Take 1  tablet (200 mg total) by mouth daily. 11/08/15   Almyra Deforest, PA  amLODipine (NORVASC) 5 MG tablet Take 1 tablet (5 mg total) by mouth daily. 03/16/16   Isaiah Serge, NP  apixaban (ELIQUIS) 5 MG TABS tablet Take 1 tablet (5 mg total) by mouth 2 (two) times daily. Patient taking differently: Take 5 mg by mouth daily.  12/09/15   Arnoldo Lenis, MD  aspirin 81 MG chewable tablet Chew 1 tablet (81 mg total) by mouth daily. 03/16/16   Isaiah Serge, NP  buPROPion (WELLBUTRIN XL) 300 MG 24 hr tablet Take 1 tablet by mouth daily. 10/10/15   Historical Provider, MD  levocetirizine (XYZAL) 5 MG tablet Take 5 mg by mouth every evening.    Historical Provider, MD  meclizine (ANTIVERT) 25 MG tablet TAKE ONE TABLET BY MOUTH THREE TIMES DAILY AS NEEDED FOR DIZZINESS Patient not taking: Reported on 02/16/2016 01/27/16   Arnoldo Lenis, MD  metoprolol succinate (TOPROL-XL) 100 MG 24 hr tablet Take 1 tablet (100 mg total) by mouth daily. Take with or immediately following a meal. 03/17/16   Isaiah Serge, NP  nitroGLYCERIN (NITROSTAT) 0.4 MG SL tablet Place 1 tablet (0.4 mg total) under the tongue once. 03/16/16 03/16/16  Isaiah Serge, NP  simvastatin (ZOCOR) 20 MG tablet Take 20 mg by mouth daily at 6 PM.     Historical Provider, MD  SYMBICORT 160-4.5 MCG/ACT inhaler Inhale 1 puff into the lungs daily. 02/16/16   Praveen Mannam, MD  ticagrelor (BRILINTA) 90 MG TABS tablet Take 1 tablet (90 mg total) by mouth 2 (two) times daily. 03/16/16   Isaiah Serge, NP    Family History Family History  Problem Relation Age of Onset  . Heart disease Mother   . COPD Father     Social History Social History  Substance Use Topics  . Smoking status: Former Smoker    Packs/day: 1.00    Years: 50.00    Types: Cigarettes    Start date: 12/15/1965    Quit date: 10/29/2015  . Smokeless tobacco: Never Used  . Alcohol use No     Allergies   Sulfa antibiotics   Review of Systems Review of Systems    Constitutional: Negative for appetite change, fatigue and fever.  HENT: Negative for congestion, ear discharge and sinus pressure.   Eyes: Negative for discharge.  Respiratory: Positive for shortness of breath and wheezing. Negative for cough and sputum production.   Cardiovascular: Negative for chest pain and leg swelling.  Gastrointestinal: Negative for abdominal pain and diarrhea.  Genitourinary: Negative for frequency and hematuria.  Musculoskeletal: Positive for back pain.  Skin: Negative for rash.  Neurological: Negative for seizures and headaches.  Psychiatric/Behavioral: Negative for hallucinations.     Physical Exam Updated Vital Signs BP (!) 105/42   Pulse (!) 53   Temp 98 F (36.7 C) (Oral)   Resp (!) 28   SpO2  100%   Physical Exam  Constitutional: She is oriented to person, place, and time. She appears well-developed.  HENT:  Head: Normocephalic.  Eyes: Conjunctivae and EOM are normal. No scleral icterus.  Neck: Neck supple. No thyromegaly present.  Cardiovascular: Normal rate and regular rhythm.  Exam reveals no gallop and no friction rub.   No murmur heard. Pulmonary/Chest: Effort normal. No stridor. She has wheezes. She has no rales. She exhibits no tenderness.  Minimal wheezing bilaterally.  Abdominal: She exhibits no distension. There is no tenderness. There is no rebound.  Musculoskeletal: Normal range of motion. She exhibits no edema.  Lymphadenopathy:    She has no cervical adenopathy.  Neurological: She is oriented to person, place, and time. She exhibits normal muscle tone. Coordination normal.  Skin: No rash noted. No erythema.  Psychiatric: She has a normal mood and affect. Her behavior is normal.  Nursing note and vitals reviewed.    ED Treatments / Results  DIAGNOSTIC STUDIES: Oxygen Saturation is 100% on RA, normal by my interpretation.   COORDINATION OF CARE: 1:44 PM- Will order labs, nebulizer treatment, Solu-Medrol injection and CXR. Pt  verbalizes understanding and agrees to plan.  Medications  ipratropium-albuterol (DUONEB) 0.5-2.5 (3) MG/3ML nebulizer solution 3 mL (not administered)  methylPREDNISolone sodium succinate (SOLU-MEDROL) 125 mg/2 mL injection 125 mg (125 mg Intravenous Given 04/03/16 1354)  LORazepam (ATIVAN) injection 0.5 mg (0.5 mg Intravenous Given 04/03/16 1354)    Labs (all labs ordered are listed, but only abnormal results are displayed) Labs Reviewed  D-DIMER, QUANTITATIVE (NOT AT [facility])  CBC WITH DIFFERENTIAL/PLATELET  [place], ED    EKG  EKG Interpretation None       Radiology No results found.  Procedures Procedures (including critical care time)  Medications Ordered in ED Medications  ipratropium-albuterol (DUONEB) 0.5-2.5 (3) MG/3ML nebulizer solution 3 mL (not administered)  methylPREDNISolone sodium succinate (SOLU-MEDROL) 125 mg/2 mL injection 125 mg (125 mg Intravenous Given 04/03/16 1354)  LORazepam (ATIVAN) injection 0.5 mg (0.5 mg Intravenous Given 04/03/16 1354)     Initial Impression / Assessment and Plan / ED Course  I have reviewed the triage vital signs and the nursing notes.  Pertinent labs & imaging results that were available during my care of the patient were reviewed by me and considered in my medical decision making (see chart for details).  Clinical Course    Patient with respiratory infection and COPD exacerbation. She will be given prednisone amoxicillin and Zofran and hydrocodone and will follow-up this week.j  Final Clinical Impressions(s) / ED Diagnoses   Final diagnoses:  None    New Prescriptions New Prescriptions   No medications on file     Milton Ferguson, MD 04/03/16 [phone]

## 2016-04-03 NOTE — ED Notes (Signed)
Pt provided with meal tray.

## 2016-04-03 NOTE — Discharge Instructions (Signed)
Follow up with your md as planned next week.

## 2016-04-06 ENCOUNTER — Encounter: Payer: Self-pay | Admitting: Adult Health

## 2016-04-06 ENCOUNTER — Ambulatory Visit (INDEPENDENT_AMBULATORY_CARE_PROVIDER_SITE_OTHER): Payer: Medicare Other | Admitting: Adult Health

## 2016-04-06 VITALS — BP 124/64 | HR 76 | Ht 66.0 in | Wt 132.0 lb

## 2016-04-06 DIAGNOSIS — R0602 Shortness of breath: Secondary | ICD-10-CM

## 2016-04-06 DIAGNOSIS — I4892 Unspecified atrial flutter: Secondary | ICD-10-CM | POA: Diagnosis not present

## 2016-04-06 DIAGNOSIS — I2510 Atherosclerotic heart disease of native coronary artery without angina pectoris: Secondary | ICD-10-CM | POA: Diagnosis not present

## 2016-04-06 LAB — CBC WITH DIFFERENTIAL/PLATELET
Basophils Absolute: 0 cells/uL (ref 0–200)
Basophils Relative: 0 %
Eosinophils Absolute: 0 cells/uL — ABNORMAL LOW (ref 15–500)
Eosinophils Relative: 0 %
HCT: 23.2 % — ABNORMAL LOW (ref 35.0–45.0)
Hemoglobin: 7.4 g/dL — ABNORMAL LOW (ref 11.7–15.5)
Lymphocytes Relative: 8 %
Lymphs Abs: 1296 cells/uL (ref 850–3900)
MCH: 29.1 pg (ref 27.0–33.0)
MCHC: 31.9 g/dL — ABNORMAL LOW (ref 32.0–36.0)
MCV: 91.3 fL (ref 80.0–100.0)
MPV: 9.3 fL (ref 7.5–12.5)
Monocytes Absolute: 486 cells/uL (ref 200–950)
Monocytes Relative: 3 %
Neutro Abs: 14418 cells/uL — ABNORMAL HIGH (ref 1500–7800)
Neutrophils Relative %: 89 %
Platelets: 384 10*3/uL (ref 140–400)
RBC: 2.54 MIL/uL — ABNORMAL LOW (ref 3.80–5.10)
RDW: 15.1 % — ABNORMAL HIGH (ref 11.0–15.0)
WBC: 16.2 10*3/uL — ABNORMAL HIGH (ref 3.8–10.8)

## 2016-04-06 LAB — BASIC METABOLIC PANEL
BUN: 46 mg/dL — ABNORMAL HIGH (ref 7–25)
CO2: 15 mmol/L — ABNORMAL LOW (ref 20–31)
Calcium: 8.6 mg/dL (ref 8.6–10.4)
Chloride: 109 mmol/L (ref 98–110)
Creat: 1.61 mg/dL — ABNORMAL HIGH (ref 0.60–0.93)
Glucose, Bld: 150 mg/dL — ABNORMAL HIGH (ref 65–99)
Potassium: 5.1 mmol/L (ref 3.5–5.3)
Sodium: 137 mmol/L (ref 135–146)

## 2016-04-06 MED ORDER — CLOPIDOGREL BISULFATE 75 MG PO TABS: 75.0000 mg | ORAL_TABLET | Freq: Every day | ORAL | 3 refills | Status: DC

## 2016-04-06 NOTE — Patient Instructions (Signed)
Medication Instructions:  STOP BRILINTA  START PLAVIX 75 MG DIALY   Labwork: Your physician recommends that you return for lab work in: TODAY  BMET CBC   Testing/Procedures: NONE  Follow-Up: Your physician recommends that you schedule a follow-up appointment in: 2 WEEKS    Any Other Special Instructions Will Be Listed Below (If Applicable).     If you need a refill on your cardiac medications before your next appointment, please call your pharmacy.

## 2016-04-06 NOTE — Progress Notes (Signed)
Cardiology Office Note   Date:  04/06/2016   ID:  Patricia Jackson, DOB 19-Feb-1941, MRN [id number]  PCP:  Wende Neighbors, MD  Cardiologist: Cloria Spring, NP   No chief complaint on file.     History of Present Illness: Patricia Jackson is a 76 y.o. female who presents for ongoing assessment and management of coronary artery disease, with other history to include hypertension, atrial flutter, COPD, and breast cancer. She had presented to [facility] with complaints of upper back pain between her shoulder blades with radiation to her arms. The patient underwent cardiac catheterization on 03/15/2016, requiring a stent to the proximal circumflex and mid circumflex artery with 99% stenosis found. She was started on aspirin, Brilinta, and ELIQUIS for one month and then, stop aspirin therapy. She is here for post hospitalization follow-up.  Unfortunately, the patient was seen in the emergency room with recurrent dyspnea associated with dry cough that began one week prior. She also had some chest discomfort, reminiscent of prior MI. She was diagnosed with a respiratory infection and COPD exacerbation. She was given prednisone, amoxicillin, and Zofran along with hydrocodone and was to follow up with primary care physician.  She is here today with worsening breathing and dyspnea on exertion. She does have a history of COPD but she states this breathing status has worsened since she had her catheterization. She denies any recurrent chest pain or angina pain which for her with pain between her shoulder blades leading her to go to the ER originally. Her daughter who is with her says that she's been so tired that she doesn't get out of bed very often and will only go to the bathroom and back. She's become quite deconditioned.n She denies bleeding, tarry stools, or melena.   Past Medical History:  Diagnosis Date  . Breast cancer ([place])   . CAD in native artery 03/16/2016  . COPD (chronic  obstructive pulmonary disease) ([place])   . Hypertension   . S/P angioplasty with stent 03/15/16 DES [place]olute, [place] 03/16/2016    Past Surgical History:  Procedure Laterality Date  . APPENDECTOMY    . BREAST SURGERY    . CARDIAC CATHETERIZATION N/A 03/15/2016   Procedure: Left Heart Cath and Coronary Angiography;  Surgeon: Belva Crome, MD;  Location: [facility];  Service: Cardiovascular;  Laterality: N/A;  . CARDIAC CATHETERIZATION N/A 03/15/2016   Procedure: Coronary Stent Intervention;  Surgeon: Belva Crome, MD;  Location: [facility];  Service: Cardiovascular;  Laterality: N/A;     Current Outpatient Prescriptions  Medication Sig Dispense Refill  . acetaminophen (TYLENOL) 325 MG tablet Take 2 tablets (650 mg total) by mouth every 4 (four) hours as needed for headache or mild pain.    Marland Kitchen albuterol (PROVENTIL) (2.5 MG/3ML) 0.083% nebulizer solution Take 3 mLs (2.5 mg total) by nebulization every 6 (six) hours as needed for wheezing or shortness of breath. 75 mL 12  . amiodarone (PACERONE) 200 MG tablet Take 1 tablet (200 mg total) by mouth daily. 30 tablet 5  . amLODipine (NORVASC) 5 MG tablet Take 1 tablet (5 mg total) by mouth daily. 30 tablet 6  . amoxicillin (AMOXIL) 500 MG capsule Take 1 capsule (500 mg total) by mouth 3 (three) times daily. 21 capsule 0  . apixaban (ELIQUIS) 5 MG TABS tablet Take 1 tablet (5 mg total) by mouth 2 (two) times daily. (Patient taking differently: Take 5 mg by mouth daily. ) 60 tablet 6  .  aspirin 81 MG chewable tablet Chew 1 tablet (81 mg total) by mouth daily.    Marland Kitchen azithromycin (ZITHROMAX) 250 MG tablet Take 2 tablets first day and then 1 tablet daily after that 6 tablet 0  . buPROPion (WELLBUTRIN XL) 300 MG 24 hr tablet Take 1 tablet by mouth daily.    Marland Kitchen HYDROcodone-acetaminophen (NORCO/VICODIN) 5-325 MG tablet Take 1 tablet by mouth every 6 (six) hours as needed for moderate pain. 10 tablet 0  . levocetirizine (XYZAL) 5 MG tablet Take  5 mg by mouth every evening.    . meclizine (ANTIVERT) 25 MG tablet TAKE ONE TABLET BY MOUTH THREE TIMES DAILY AS NEEDED FOR DIZZINESS 30 tablet 0  . metoprolol succinate (TOPROL-XL) 100 MG 24 hr tablet Take 1 tablet (100 mg total) by mouth daily. Take with or immediately following a meal. 30 tablet 6  . ondansetron (ZOFRAN ODT) 4 MG disintegrating tablet 4mg  ODT q4 hours prn nausea/vomit 12 tablet 0  . predniSONE (DELTASONE) 20 MG tablet 2 tabs po daily x 3 days 6 tablet 0  . simvastatin (ZOCOR) 20 MG tablet Take 20 mg by mouth daily at 6 PM.     . SYMBICORT 160-4.5 MCG/ACT inhaler Inhale 1 puff into the lungs daily. 1 Inhaler 5  . clopidogrel (PLAVIX) 75 MG tablet Take 1 tablet (75 mg total) by mouth daily. 90 tablet 3  . nitroGLYCERIN (NITROSTAT) 0.4 MG SL tablet Place 1 tablet (0.4 mg total) under the tongue once. 25 tablet 4   No current facility-administered medications for this visit.     Allergies:   Sulfa antibiotics    Social History:  The patient  reports that she quit smoking about 5 months ago. Her smoking use included Cigarettes. She started smoking about 50 years ago. She has a 50.00 pack-year smoking history. She has never used smokeless tobacco. She reports that she does not drink alcohol or use drugs.   Family History:  The patient's family history includes COPD in her father; Heart disease in her mother.    ROS: All other systems are reviewed and negative. Unless otherwise mentioned in H&P    PHYSICAL EXAM: VS:  BP 124/64   Pulse 76   Ht 5\' 6"  (1.676 m)   Wt 132 lb (59.9 kg)   SpO2 (!) 88% Comment: 88- highest I could get it  BMI 21.31 kg/m  , BMI Body mass index is 21.31 kg/m. GEN: Well nourished, well developed, in no acute distress  HEENT: normal  Neck: no JVD, carotid bruits, or masses Cardiac: RRR; bradycardic, no murmurs, rubs, or gallops,no edema  Respiratory:  clear to auscultation bilaterally,increased work of breathing. GI: soft, nontender,  nondistended, + BS MS , : no deformity or atrophy absent dorsalis pedis pulse on the right. Foot is cool to touch  Doppler posterior tibial pulse on the right. Palpable pulse on the left posterior tibial and dorsalis pedis  Skin: warm and dry, no rash Neuro:  Strength and sensation are intact Psych: euthymic mood, full affect   EKG:The ekg ordered today demonstrates sinus bradycardia heart rate of 52 bpm motion artifact is noted  Recent Labs: 11/01/2015: TSH 0.830 11/02/2015: Magnesium 2.4 04/03/2016: ALT 25; B Natriuretic Peptide 177.0; BUN 43; Creatinine, Ser 1.55; Hemoglobin 8.4; Platelets 291; Potassium 4.2; Sodium 139    Lipid Panel    Component Value Date/Time   CHOL 159 03/13/2016 0324   TRIG 63 03/13/2016 0324   HDL 70 03/13/2016 0324   CHOLHDL 2.3 03/13/2016  0324   VLDL 13 03/13/2016 0324   LDLCALC 76 03/13/2016 0324      Wt Readings from Last 3 Encounters:  04/06/16 132 lb (59.9 kg)  03/16/16 136 lb 0.4 oz (61.7 kg)  02/16/16 135 lb 6.4 oz (61.4 kg)      Other studies Reviewed: Additional studies/ records that were reviewed today include: Labs Review of the above records demonstrates:  CBC dated 04/03/2016: White blood cells 13.2  RBCs 2.80 Hgb: 8.4 Hct 26.0 PLTS 291.   Sodium 139, potassium 4.2, chloride 113, CO2 19, BUN 43, creatinine 1.55.   ASSESSMENT AND PLAN:  1.  Dyspnea: I have suspicions that Brilinta may be contributing to her shortness of breath as this is a common side effect. I will stop the Brilinta and begin Plavix 75 mg daily.  Which she will start tomorrow. I did not believe that her breathing status is related to coronary artery disease at this time.  O2 sat in the office is 88% at rest   2. Anemia: Noted on blood work on 04/03/2016 with a hemoglobin of 8.4 and hematocrit of 26.0. We'll get stat CBC to evaluate for worsening anemia contributing to dyspnea as she is both on Brilinta and ELIQUIS along with aspirin.. We will call her with the  results of her CBC and BMET. This will be also copy to her primary care physician Dr. Nevada Crane. I've advised her that if her breathing gets worse or she begins to have more symptoms that she is to present to the hospital.  3. Coronary artery disease: Status post cardiac catheterization with drug-eluting stents to proximal and mid circumflex artery. She is slightly bradycardic, we'll continue her on metoprolol 100 mg daily, and statin therapy., We'll take her off of aspirin in 2 weeks.  4. Paroxysmal atrial fibrillation: She is on ELIQUIS, and amiodarone 200 mg daily. Heart rate is bradycardic in the 50s. Normal sinus rhythm.  5. History of COPD: Is to follow-up with pulmonologist. He has not yet had appointment. She is not on oxygen at home. She uses albuterol nebulizer solution when necessary.   Current medicines are reviewed at length with the patient today.    Labs/ tests ordered today include:   Orders Placed This Encounter  Procedures  . Basic Metabolic Panel (BMET)  . CBC with Differential  . EKG 12-Lead     Disposition:   FU with 2 weeks Dr. Harl Bowie   Signed, Jory Sims, NP  04/06/2016 3:20 PM    [facility]. [address], [place], [place] [address] Phone: [phone]; Fax: [phone]

## 2016-04-06 NOTE — Progress Notes (Signed)
Name: Patricia Jackson    DOB: Oct 24, 1940  Age: 75 y.o.  MR#: [id number]       PCP:  Wende Neighbors, MD      Insurance: Payor: [employer] MEDICARE / Plan: [facility] MEDICARE / Product Type: *No Product type* /   CC:   No chief complaint on file.   VS Vitals:   04/06/16 1439  BP: 124/64  Pulse: 76  SpO2: (!) 88%  Weight: 132 lb (59.9 kg)  Height: 5\' 6"  (1.676 m)    Weights Current Weight  04/06/16 132 lb (59.9 kg)  03/16/16 136 lb 0.4 oz (61.7 kg)  02/16/16 135 lb 6.4 oz (61.4 kg)    Blood Pressure  BP Readings from Last 3 Encounters:  04/06/16 124/64  04/03/16 115/89  03/16/16 (!) 146/73     Admit date:  (Not on file) Last encounter with RMR:  01/27/2016   Allergy Sulfa antibiotics  Current Outpatient Prescriptions  Medication Sig Dispense Refill  . acetaminophen (TYLENOL) 325 MG tablet Take 2 tablets (650 mg total) by mouth every 4 (four) hours as needed for headache or mild pain.    Marland Kitchen albuterol (PROVENTIL) (2.5 MG/3ML) 0.083% nebulizer solution Take 3 mLs (2.5 mg total) by nebulization every 6 (six) hours as needed for wheezing or shortness of breath. 75 mL 12  . amiodarone (PACERONE) 200 MG tablet Take 1 tablet (200 mg total) by mouth daily. 30 tablet 5  . amLODipine (NORVASC) 5 MG tablet Take 1 tablet (5 mg total) by mouth daily. 30 tablet 6  . amoxicillin (AMOXIL) 500 MG capsule Take 1 capsule (500 mg total) by mouth 3 (three) times daily. 21 capsule 0  . apixaban (ELIQUIS) 5 MG TABS tablet Take 1 tablet (5 mg total) by mouth 2 (two) times daily. (Patient taking differently: Take 5 mg by mouth daily. ) 60 tablet 6  . aspirin 81 MG chewable tablet Chew 1 tablet (81 mg total) by mouth daily.    Marland Kitchen azithromycin (ZITHROMAX) 250 MG tablet Take 2 tablets first day and then 1 tablet daily after that 6 tablet 0  . buPROPion (WELLBUTRIN XL) 300 MG 24 hr tablet Take 1 tablet by mouth daily.    Marland Kitchen HYDROcodone-acetaminophen (NORCO/VICODIN) 5-325 MG tablet Take 1 tablet by mouth every 6  (six) hours as needed for moderate pain. 10 tablet 0  . levocetirizine (XYZAL) 5 MG tablet Take 5 mg by mouth every evening.    . meclizine (ANTIVERT) 25 MG tablet TAKE ONE TABLET BY MOUTH THREE TIMES DAILY AS NEEDED FOR DIZZINESS 30 tablet 0  . metoprolol succinate (TOPROL-XL) 100 MG 24 hr tablet Take 1 tablet (100 mg total) by mouth daily. Take with or immediately following a meal. 30 tablet 6  . ondansetron (ZOFRAN ODT) 4 MG disintegrating tablet 4mg  ODT q4 hours prn nausea/vomit 12 tablet 0  . predniSONE (DELTASONE) 20 MG tablet 2 tabs po daily x 3 days 6 tablet 0  . simvastatin (ZOCOR) 20 MG tablet Take 20 mg by mouth daily at 6 PM.     . SYMBICORT 160-4.5 MCG/ACT inhaler Inhale 1 puff into the lungs daily. 1 Inhaler 5  . ticagrelor (BRILINTA) 90 MG TABS tablet Take 1 tablet (90 mg total) by mouth 2 (two) times daily. 180 tablet 4  . nitroGLYCERIN (NITROSTAT) 0.4 MG SL tablet Place 1 tablet (0.4 mg total) under the tongue once. 25 tablet 4   No current facility-administered medications for this visit.     Discontinued Meds:  Medications Discontinued During This Encounter  Medication Reason  . azithromycin (ZITHROMAX) 250 MG tablet Error    Patient Active Problem List   Diagnosis Date Noted  . CAD in native artery 03/16/2016  . S/P angioplasty with stent 03/15/16 DES [place]olute, [place] 03/16/2016  . Acute coronary syndrome ([place])   . NSTEMI (non-ST elevated myocardial infarction) ([place]) 03/12/2016  . Shortness of breath   . Acute respiratory failure with hypoxia ([place])   . Hypophosphatemia   . 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[place]) 10/31/2015  . Acute exacerbation of chronic bronchitis ([place]) 10/31/2015  . COPD exacerbation ([place]) 10/31/2015  . Dyslipidemia 10/31/2015  . Elevated troponin 10/31/2015  . Atrial flutter with rapid ventricular response ([place]) 10/30/2015  . SOB (shortness of breath) 11/26/2013  . Back pain 11/26/2013  . Arm pain 11/26/2013  . Chest pain 11/26/2013  . Hypertension      LABS    Component Value Date/Time   NA 139 04/03/2016 1351   NA 135 03/16/2016 0220   NA 137 03/15/2016 0332   K 4.2 04/03/2016 1351   K 5.0 03/16/2016 0220   K 4.4 03/15/2016 0332   CL 113 (H) 04/03/2016 1351   CL 107 03/16/2016 0220   CL 106 03/15/2016 0332   CO2 19 (L) 04/03/2016 1351   CO2 22 03/16/2016 0220   CO2 22 03/15/2016 0332   GLUCOSE 109 (H) 04/03/2016 1351   GLUCOSE 99 03/16/2016 0220   GLUCOSE 94 03/15/2016 0332   BUN 43 (H) 04/03/2016 1351   BUN 30 (H) 03/16/2016 0220   BUN 21 (H) 03/15/2016 0332   CREATININE 1.55 (H) 04/03/2016 1351   CREATININE 1.49 (H) 03/16/2016 0220   CREATININE 1.23 (H) 03/15/2016 0332   CREATININE 1.56 (H) 12/12/2015 0833   CREATININE 1.41 (H) 11/28/2015 1246   CREATININE 1.24 (H) 11/14/2015 0931   CALCIUM 8.8 (L) 04/03/2016 1351   CALCIUM 9.5 03/16/2016 0220   CALCIUM 9.6 03/15/2016 0332   GFRNONAA 32 (L) 04/03/2016 1351   GFRNONAA 34 (L) 03/16/2016 0220   GFRNONAA 42 (L) 03/15/2016 0332   GFRAA 37 (L) 04/03/2016 1351   GFRAA 39 (L) 03/16/2016 0220   GFRAA 49 (L) 03/15/2016 0332   CMP     Component Value Date/Time   NA 139 04/03/2016 1351   K 4.2 04/03/2016 1351   CL 113 (H) 04/03/2016 1351   CO2 19 (L) 04/03/2016 1351   GLUCOSE 109 (H) 04/03/2016 1351   BUN 43 (H) 04/03/2016 1351   CREATININE 1.55 (H) 04/03/2016 1351   CREATININE 1.56 (H) 12/12/2015 0833   CALCIUM 8.8 (L) 04/03/2016 1351   PROT 6.6 04/03/2016 1351   ALBUMIN 3.3 (L) 04/03/2016 1351   AST 35 04/03/2016 1351   ALT 25 04/03/2016 1351   ALKPHOS 61 04/03/2016 1351   BILITOT 0.3 04/03/2016 1351   GFRNONAA 32 (L) 04/03/2016 1351   GFRAA 37 (L) 04/03/2016 1351       Component Value Date/Time   WBC 13.2 (H) 04/03/2016 1351   WBC 9.3 03/16/2016 0220   WBC 10.3 03/15/2016 0332   HGB 8.4 (L) 04/03/2016 1351   HGB 12.6 03/16/2016 0220   HGB 13.1 03/15/2016 0332   HCT 26.0 (L) 04/03/2016 1351   HCT 39.4 03/16/2016 0220   HCT 40.5 03/15/2016 0332    MCV 92.9 04/03/2016 1351   MCV 91.0 03/16/2016 0220   MCV 90.8 03/15/2016 0332    Lipid Panel     Component Value Date/Time   CHOL 159 03/13/2016 0324  TRIG 63 03/13/2016 0324   HDL 70 03/13/2016 0324   CHOLHDL 2.3 03/13/2016 0324   VLDL 13 03/13/2016 0324   LDLCALC 76 03/13/2016 0324    ABG    Component Value Date/Time   TCO2 17 10/30/2015 1759     Lab Results  Component Value Date   TSH 0.830 11/01/2015   BNP (last 3 results)  Recent Labs  11/01/15 2330 04/03/16 1351  BNP 375.4* 177.0*    ProBNP (last 3 results) No results for input(s): PROBNP in the last 8760 hours.  Cardiac Panel (last 3 results) No results for input(s): CKTOTAL, CKMB, TROPONINI, RELINDX in the last 72 hours.  Iron/TIBC/Ferritin/ %Sat No results found for: IRON, TIBC, FERRITIN, IRONPCTSAT   EKG Orders placed or performed in visit on 04/06/16  . EKG 12-Lead     Prior Assessment and Plan Problem List as of 04/06/2016 Reviewed: 03/16/2016  7:13 AM by Cecilie Kicks, NP     Cardiovascular and Mediastinum   Hypertension   Atrial flutter with rapid ventricular response (HCC)   NSTEMI (non-ST elevated myocardial infarction) ([place])   Acute coronary syndrome ([place])   CAD in native artery     Respiratory   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[place])   Acute exacerbation of chronic bronchitis (HCC)   COPD exacerbation (HCC)   Acute respiratory failure with hypoxia (HCC)     Other   SOB (shortness of breath)   Back pain   Arm pain   Chest pain   Dyslipidemia   Elevated troponin   Hypophosphatemia   Shortness of breath   S/P angioplasty with stent 03/15/16 DES [place], [place]       Imaging: Dg Chest 2 View  Result Date: 04/03/2016 CLINICAL DATA:  SOB today, hx of COPD, pt stated she had cardiac stent placement Mar 16, 2016 EXAM: CHEST  2 VIEW COMPARISON:  03/12/2016 FINDINGS: Cardiac silhouette is normal in size. No mediastinal or hilar masses. No evidence of adenopathy. Lungs are hyperexpanded. Mild  reticular scarring at the lung apices and anterior lung bases. Lungs otherwise clear. No pleural effusion.  No pneumothorax. Skeletal structures are demineralized but grossly intact. IMPRESSION: 1. No acute cardiopulmonary disease. 2. COPD. Electronically Signed   By: Lajean Manes M.D.   On: 04/03/2016 14:18   Dg Chest Portable 1 View  Result Date: 03/12/2016 CLINICAL DATA:  Upper back pain.  Acute coronary syndrome EXAM: PORTABLE CHEST 1 VIEW COMPARISON:  12/03/2015 FINDINGS: There is hyperinflation of the lungs compatible with COPD. Heart is borderline in size. No confluent opacities, effusions or acute bony abnormality. IMPRESSION: COPD.  No active disease. Electronically Signed   By: Rolm Baptise M.D.   On: 03/12/2016 08:52

## 2016-04-07 ENCOUNTER — Observation Stay (HOSPITAL_COMMUNITY)
Admission: EM | Admit: 2016-04-07 | Discharge: 2016-04-08 | Disposition: A | Discharge status: Home / Self Care | Payer: Medicare Other | Attending: Nephrology | Admitting: Nephrology

## 2016-04-07 ENCOUNTER — Telehealth: Payer: Self-pay

## 2016-04-07 ENCOUNTER — Encounter (HOSPITAL_COMMUNITY): Payer: Self-pay

## 2016-04-07 DIAGNOSIS — Z79899 Other long term (current) drug therapy: Secondary | ICD-10-CM | POA: Insufficient documentation

## 2016-04-07 DIAGNOSIS — I4891 Unspecified atrial fibrillation: Secondary | ICD-10-CM

## 2016-04-07 DIAGNOSIS — I10 Essential (primary) hypertension: Secondary | ICD-10-CM | POA: Diagnosis not present

## 2016-04-07 DIAGNOSIS — R001 Bradycardia, unspecified: Secondary | ICD-10-CM | POA: Diagnosis not present

## 2016-04-07 DIAGNOSIS — Z955 Presence of coronary angioplasty implant and graft: Secondary | ICD-10-CM

## 2016-04-07 DIAGNOSIS — Z87891 Personal history of nicotine dependence: Secondary | ICD-10-CM | POA: Diagnosis not present

## 2016-04-07 DIAGNOSIS — Z853 Personal history of malignant neoplasm of breast: Secondary | ICD-10-CM | POA: Insufficient documentation

## 2016-04-07 DIAGNOSIS — I480 Paroxysmal atrial fibrillation: Secondary | ICD-10-CM | POA: Diagnosis not present

## 2016-04-07 DIAGNOSIS — R1319 Other dysphagia: Secondary | ICD-10-CM

## 2016-04-07 DIAGNOSIS — D649 Anemia, unspecified: Secondary | ICD-10-CM | POA: Diagnosis not present

## 2016-04-07 DIAGNOSIS — M6281 Muscle weakness (generalized): Secondary | ICD-10-CM

## 2016-04-07 DIAGNOSIS — R1310 Dysphagia, unspecified: Secondary | ICD-10-CM

## 2016-04-07 DIAGNOSIS — J449 Chronic obstructive pulmonary disease, unspecified: Secondary | ICD-10-CM | POA: Insufficient documentation

## 2016-04-07 DIAGNOSIS — I2510 Atherosclerotic heart disease of native coronary artery without angina pectoris: Secondary | ICD-10-CM | POA: Diagnosis not present

## 2016-04-07 DIAGNOSIS — R0602 Shortness of breath: Secondary | ICD-10-CM | POA: Diagnosis present

## 2016-04-07 HISTORY — DX: Unspecified atrial flutter: I48.92

## 2016-04-07 LAB — COMPREHENSIVE METABOLIC PANEL
ALT: 26 U/L (ref 14–54)
AST: 35 U/L (ref 15–41)
Albumin: 2.9 g/dL — ABNORMAL LOW (ref 3.5–5.0)
Alkaline Phosphatase: 53 U/L (ref 38–126)
Anion gap: 7 (ref 5–15)
BUN: 39 mg/dL — ABNORMAL HIGH (ref 6–20)
CO2: 21 mmol/L — ABNORMAL LOW (ref 22–32)
Calcium: 8.6 mg/dL — ABNORMAL LOW (ref 8.9–10.3)
Chloride: 109 mmol/L (ref 101–111)
Creatinine, Ser: 1.64 mg/dL — ABNORMAL HIGH (ref 0.44–1.00)
GFR calc Af Amer: 35 mL/min — ABNORMAL LOW (ref >60)
GFR calc non Af Amer: 30 mL/min — ABNORMAL LOW (ref >60)
Glucose, Bld: 99 mg/dL (ref 65–99)
Potassium: 4.2 mmol/L (ref 3.5–5.1)
Sodium: 137 mmol/L (ref 135–145)
Total Bilirubin: 0.3 mg/dL (ref 0.3–1.2)
Total Protein: 6.1 g/dL — ABNORMAL LOW (ref 6.5–8.1)

## 2016-04-07 LAB — CBC
HCT: 29.6 % — ABNORMAL LOW (ref 36.0–46.0)
Hemoglobin: 10 g/dL — ABNORMAL LOW (ref 12.0–15.0)
MCH: 30.6 pg (ref 26.0–34.0)
MCHC: 33.8 g/dL (ref 30.0–36.0)
MCV: 90.5 fL (ref 78.0–100.0)
Platelets: 245 10*3/uL (ref 150–400)
RBC: 3.27 MIL/uL — ABNORMAL LOW (ref 3.87–5.11)
RDW: 14.5 % (ref 11.5–15.5)
WBC: 11.2 10*3/uL — ABNORMAL HIGH (ref 4.0–10.5)

## 2016-04-07 LAB — CBC WITH DIFFERENTIAL/PLATELET
Basophils Absolute: 0 10*3/uL (ref 0.0–0.1)
Basophils Relative: 0 %
Eosinophils Absolute: 0.1 10*3/uL (ref 0.0–0.7)
Eosinophils Relative: 1 %
HCT: 20.4 % — ABNORMAL LOW (ref 36.0–46.0)
Hemoglobin: 6.5 g/dL — CL (ref 12.0–15.0)
Lymphocytes Relative: 18 %
Lymphs Abs: 2.6 10*3/uL (ref 0.7–4.0)
MCH: 30 pg (ref 26.0–34.0)
MCHC: 31.9 g/dL (ref 30.0–36.0)
MCV: 94 fL (ref 78.0–100.0)
Monocytes Absolute: 1.2 10*3/uL — ABNORMAL HIGH (ref 0.1–1.0)
Monocytes Relative: 8 %
Neutro Abs: 10.8 10*3/uL — ABNORMAL HIGH (ref 1.7–7.7)
Neutrophils Relative %: 73 %
Platelets: 297 10*3/uL (ref 150–400)
RBC: 2.17 MIL/uL — ABNORMAL LOW (ref 3.87–5.11)
RDW: 15.2 % (ref 11.5–15.5)
WBC: 14.7 10*3/uL — ABNORMAL HIGH (ref 4.0–10.5)

## 2016-04-07 LAB — IRON AND TIBC
Iron: 21 ug/dL — ABNORMAL LOW (ref 28–170)
Saturation Ratios: 8 % — ABNORMAL LOW (ref 10.4–31.8)
TIBC: 274 ug/dL (ref 250–450)
UIBC: 253 ug/dL

## 2016-04-07 LAB — FERRITIN: Ferritin: 161 ng/mL (ref 11–307)

## 2016-04-07 LAB — URINALYSIS, ROUTINE W REFLEX MICROSCOPIC
Bilirubin Urine: NEGATIVE
Glucose, UA: NEGATIVE mg/dL
Hgb urine dipstick: NEGATIVE
Ketones, ur: NEGATIVE mg/dL
Nitrite: NEGATIVE
Protein, ur: NEGATIVE mg/dL
Specific Gravity, Urine: 1.017 (ref 1.005–1.030)
pH: 5 (ref 5.0–8.0)

## 2016-04-07 LAB — ABO/RH: ABO/RH(D): A POS

## 2016-04-07 LAB — LACTATE DEHYDROGENASE: LDH: 136 U/L (ref 98–192)

## 2016-04-07 LAB — POC OCCULT BLOOD, ED: Fecal Occult Bld: NEGATIVE

## 2016-04-07 LAB — RETICULOCYTES
RBC.: 2.17 MIL/uL — ABNORMAL LOW (ref 3.87–5.11)
Retic Count, Absolute: 119.4 10*3/uL (ref 19.0–186.0)
Retic Ct Pct: 5.5 % — ABNORMAL HIGH (ref 0.4–3.1)

## 2016-04-07 LAB — TSH: TSH: 4.978 u[IU]/mL — ABNORMAL HIGH (ref 0.350–4.500)

## 2016-04-07 LAB — VITAMIN B12: Vitamin B-12: 375 pg/mL (ref 180–914)

## 2016-04-07 LAB — PREPARE RBC (CROSSMATCH)

## 2016-04-07 MED ORDER — ONDANSETRON HCL 4 MG PO TABS: 4.0000 mg | ORAL_TABLET | Freq: Four times a day (QID) | ORAL | Status: DC | PRN

## 2016-04-07 MED ORDER — METOPROLOL SUCCINATE ER 50 MG PO TB24
50.0000 mg | ORAL_TABLET | Freq: Every day | ORAL | Status: DC
  Administered 2016-04-08: 50 mg via ORAL
  Filled 2016-04-07: qty 1

## 2016-04-07 MED ORDER — DOCUSATE SODIUM 100 MG PO CAPS
100.0000 mg | ORAL_CAPSULE | Freq: Two times a day (BID) | ORAL | Status: DC
  Administered 2016-04-07 – 2016-04-08 (×3): 100 mg via ORAL
  Filled 2016-04-07 (×3): qty 1

## 2016-04-07 MED ORDER — ALBUTEROL SULFATE (2.5 MG/3ML) 0.083% IN NEBU: 2.5000 mg | INHALATION_SOLUTION | Freq: Four times a day (QID) | RESPIRATORY_TRACT | Status: DC | PRN

## 2016-04-07 MED ORDER — AMIODARONE HCL 200 MG PO TABS
200.0000 mg | ORAL_TABLET | Freq: Every day | ORAL | Status: DC
  Administered 2016-04-07 – 2016-04-08 (×2): 200 mg via ORAL
  Filled 2016-04-07 (×2): qty 1

## 2016-04-07 MED ORDER — ACETAMINOPHEN 325 MG PO TABS: 650.0000 mg | ORAL_TABLET | Freq: Four times a day (QID) | ORAL | Status: DC | PRN

## 2016-04-07 MED ORDER — BUPROPION HCL ER (XL) 300 MG PO TB24
300.0000 mg | ORAL_TABLET | Freq: Every day | ORAL | Status: DC
  Administered 2016-04-07 – 2016-04-08 (×2): 300 mg via ORAL
  Filled 2016-04-07 (×3): qty 1

## 2016-04-07 MED ORDER — HYDROCODONE-ACETAMINOPHEN 5-325 MG PO TABS
1.0000 | ORAL_TABLET | Freq: Four times a day (QID) | ORAL | Status: DC | PRN
  Administered 2016-04-07 – 2016-04-08 (×2): 1 via ORAL
  Filled 2016-04-07 (×2): qty 1

## 2016-04-07 MED ORDER — LORATADINE 10 MG PO TABS
10.0000 mg | ORAL_TABLET | Freq: Every day | ORAL | Status: DC
  Administered 2016-04-07: 10 mg via ORAL
  Filled 2016-04-07: qty 1

## 2016-04-07 MED ORDER — ASPIRIN 81 MG PO CHEW
81.0000 mg | CHEWABLE_TABLET | Freq: Every day | ORAL | Status: DC
  Administered 2016-04-07: 81 mg via ORAL
  Filled 2016-04-07: qty 1

## 2016-04-07 MED ORDER — LEVOCETIRIZINE DIHYDROCHLORIDE 5 MG PO TABS: 5.0000 mg | ORAL_TABLET | Freq: Every evening | ORAL | Status: DC

## 2016-04-07 MED ORDER — METOPROLOL SUCCINATE ER 50 MG PO TB24: 100.0000 mg | ORAL_TABLET | Freq: Every day | ORAL | Status: DC

## 2016-04-07 MED ORDER — SODIUM CHLORIDE 0.9 % IV SOLN
10.0000 mL/h | Freq: Once | INTRAVENOUS | Status: AC
  Administered 2016-04-07: 10 mL/h via INTRAVENOUS

## 2016-04-07 MED ORDER — CLOPIDOGREL BISULFATE 75 MG PO TABS
75.0000 mg | ORAL_TABLET | Freq: Every day | ORAL | Status: DC
  Administered 2016-04-07 – 2016-04-08 (×2): 75 mg via ORAL
  Filled 2016-04-07 (×2): qty 1

## 2016-04-07 MED ORDER — SIMVASTATIN 20 MG PO TABS
20.0000 mg | ORAL_TABLET | Freq: Every day | ORAL | Status: DC
Start: 2016-04-07 — End: 2016-04-08
  Administered 2016-04-07: 20 mg via ORAL
  Filled 2016-04-07: qty 1

## 2016-04-07 MED ORDER — MECLIZINE HCL 12.5 MG PO TABS: 25.0000 mg | ORAL_TABLET | Freq: Two times a day (BID) | ORAL | Status: DC | PRN

## 2016-04-07 MED ORDER — NITROGLYCERIN 0.4 MG SL SUBL: 0.4000 mg | SUBLINGUAL_TABLET | Freq: Once | SUBLINGUAL | Status: DC

## 2016-04-07 MED ORDER — ACETAMINOPHEN 650 MG RE SUPP: 650.0000 mg | Freq: Four times a day (QID) | RECTAL | Status: DC | PRN

## 2016-04-07 MED ORDER — AMLODIPINE BESYLATE 5 MG PO TABS
5.0000 mg | ORAL_TABLET | Freq: Every day | ORAL | Status: DC
  Administered 2016-04-07 – 2016-04-08 (×2): 5 mg via ORAL
  Filled 2016-04-07 (×2): qty 1

## 2016-04-07 MED ORDER — BISACODYL 5 MG PO TBEC: 5.0000 mg | DELAYED_RELEASE_TABLET | Freq: Every day | ORAL | Status: DC | PRN

## 2016-04-07 MED ORDER — MOMETASONE FURO-FORMOTEROL FUM 200-5 MCG/ACT IN AERO
2.0000 | INHALATION_SPRAY | Freq: Two times a day (BID) | RESPIRATORY_TRACT | Status: DC
  Administered 2016-04-08: 2 via RESPIRATORY_TRACT
  Filled 2016-04-07: qty 8.8

## 2016-04-07 MED ORDER — ONDANSETRON HCL 4 MG/2ML IJ SOLN: 4.0000 mg | Freq: Four times a day (QID) | INTRAMUSCULAR | Status: DC | PRN

## 2016-04-07 NOTE — ED Triage Notes (Signed)
Pt reports she had blood work yesterday and was told her hemoglobin is low.  Pt reports shortness of breath, generalized weakness, and feeling light headed. Denies any dark or bloody stools.  Reports nausea this morning but it improved.

## 2016-04-07 NOTE — ED Notes (Signed)
Pt's HR in 40's, edp aware and is at bedside.

## 2016-04-07 NOTE — H&P (Addendum)
History and Physical    Patricia Jackson TIW:[id number] DOB: November 16, 1940 DOA: 04/07/2016  PCP: Wende Neighbors, MD  Patient coming from:Home  Chief Complaint: Sent from cardiologist office for low hemoglobin level.  HPI: Patricia Jackson is a 76 y.o. female with medical history significant of hypertension, dyslipidemia, COPD not on home oxygen, NSTEMI status post cardiac catheterization and stent placement on December 11 at [facility], on a Eliquis,  aspirin and Brilinta, went to the cardiologist office yesterday for hospital discharge follow-up appointment blood test were done. Today patient was advised to come to the hospital for low hemoglobin level of 6.5. Patient reported worsening shortness of breath especially dyspnea on exertion associated with generalized weakness, lightheadedness while walking. She feels weak and fatigued. Denied headache, fever, chills, nausea, vomiting, chest pain, abdominal pain. Denied black colored stool or hematuria. Patient however reports constipation. She quit smoking since stent placement. No alcohol abuse. Lives with her family.  ED Course: In the ER patient was found to have hemoglobin of 6.5. Rectal exam was done by ER physician which was negative for fecal occult blood. Plan to transfuse 2 units of red blood cells and admitted for further evaluation.   Review of Systems: As per HPI otherwise 10 point review of systems negative.    Past Medical History:  Diagnosis Date  . Breast cancer ([place])   . CAD in native artery 03/16/2016  . COPD (chronic obstructive pulmonary disease) ([place])   . Hypertension   . S/P angioplasty with stent 03/15/16 DES Resolute, [place] 03/16/2016    Past Surgical History:  Procedure Laterality Date  . APPENDECTOMY    . BREAST SURGERY    . CARDIAC CATHETERIZATION N/A 03/15/2016   Procedure: Left Heart Cath and Coronary Angiography;  Surgeon: Belva Crome, MD;  Location: [place] [facility];  Service: Cardiovascular;  Laterality:  N/A;  . CARDIAC CATHETERIZATION N/A 03/15/2016   Procedure: Coronary Stent Intervention;  Surgeon: Belva Crome, MD;  Location: [place] [facility];  Service: Cardiovascular;  Laterality: N/A;    Social history:  reports that she quit smoking about 3 weeks ago after the cardiac stent placement. Her smoking use included Cigarettes. She started smoking about 50 years ago. She has a 50.00 pack-year smoking history. She has never used smokeless tobacco. She reports that she does not drink alcohol or use drugs.  Allergies  Allergen Reactions  . Sulfa Antibiotics Hives    Family History  Problem Relation Age of Onset  . Heart disease Mother   . COPD Father      Prior to Admission medications   Medication Sig Start Date End Date Taking? Authorizing Provider  acetaminophen (TYLENOL) 325 MG tablet Take 2 tablets (650 mg total) by mouth every 4 (four) hours as needed for headache or mild pain. 03/16/16  Yes Isaiah Serge, NP  albuterol (PROVENTIL) (2.5 MG/3ML) 0.083% nebulizer solution Take 3 mLs (2.5 mg total) by nebulization every 6 (six) hours as needed for wheezing or shortness of breath. 11/08/15  Yes Almyra Deforest, PA  amiodarone (PACERONE) 200 MG tablet Take 1 tablet (200 mg total) by mouth daily. 11/08/15  Yes Almyra Deforest, PA  amLODipine (NORVASC) 5 MG tablet Take 1 tablet (5 mg total) by mouth daily. 03/16/16  Yes Isaiah Serge, NP  amoxicillin (AMOXIL) 500 MG capsule Take 1 capsule (500 mg total) by mouth 3 (three) times daily. 04/03/16  Yes Milton Ferguson, MD  apixaban (ELIQUIS) 5 MG TABS tablet Take 1 tablet (5  mg total) by mouth 2 (two) times daily. 12/09/15  Yes Arnoldo Lenis, MD  aspirin 81 MG chewable tablet Chew 1 tablet (81 mg total) by mouth daily. 03/16/16  Yes Isaiah Serge, NP  buPROPion (WELLBUTRIN XL) 300 MG 24 hr tablet Take 1 tablet by mouth daily. 10/10/15  Yes Historical Provider, MD  clopidogrel (PLAVIX) 75 MG tablet Take 1 tablet (75 mg total) by mouth daily. 04/06/16  Yes  Lendon Colonel, NP  HYDROcodone-acetaminophen (NORCO/VICODIN) 5-325 MG tablet Take 1 tablet by mouth every 6 (six) hours as needed for moderate pain. 04/03/16  Yes Milton Ferguson, MD  levocetirizine (XYZAL) 5 MG tablet Take 5 mg by mouth every evening.   Yes Historical Provider, MD  meclizine (ANTIVERT) 25 MG tablet TAKE ONE TABLET BY MOUTH THREE TIMES DAILY AS NEEDED FOR DIZZINESS 01/27/16  Yes Arnoldo Lenis, MD  metoprolol succinate (TOPROL-XL) 100 MG 24 hr tablet Take 1 tablet (100 mg total) by mouth daily. Take with or immediately following a meal. 03/17/16  Yes Isaiah Serge, NP  nitroGLYCERIN (NITROSTAT) 0.4 MG SL tablet Place 1 tablet (0.4 mg total) under the tongue once. 03/16/16 04/07/16 Yes Isaiah Serge, NP  ondansetron (ZOFRAN ODT) 4 MG disintegrating tablet 63m ODT q4 hours prn nausea/vomit 04/03/16  Yes JMilton Ferguson MD  simvastatin (ZOCOR) 20 MG tablet Take 20 mg by mouth daily at 6 PM.    Yes Historical Provider, MD  SYMBICORT 160-4.5 MCG/ACT inhaler Inhale 1 puff into the lungs daily. 02/16/16  Yes PMarshell Garfinkel MD  azithromycin (ZITHROMAX) 250 MG tablet Take 2 tablets first day and then 1 tablet daily after that Patient not taking: Reported on 04/07/2016 04/03/16   JMilton Ferguson MD  predniSONE (DELTASONE) 20 MG tablet 2 tabs po daily x 3 days Patient not taking: Reported on 04/07/2016 04/03/16   JMilton Ferguson MD    Physical Exam: Vitals:   04/07/16 0838 04/07/16 0900 04/07/16 1026 04/07/16 1044  BP: 122/65 136/57 (!) 112/46 (!) 107/50  Pulse: (!) 55  (!) 54 (!) 56  Resp: 24 20 18 18   Temp: 97.7 F (36.5 C)  97.6 F (36.4 C) 97.6 F (36.4 C)  TempSrc: Oral  Oral Oral  SpO2: 99%  100% 100%  Weight:    59.6 kg (131 lb 8 oz)  Height:    5' 6"  (1.676 m)      Constitutional: NAD, calm, comfortable Vitals:   04/07/16 0838 04/07/16 0900 04/07/16 1026 04/07/16 1044  BP: 122/65 136/57 (!) 112/46 (!) 107/50  Pulse: (!) 55  (!) 54 (!) 56  Resp: 24 20 18 18   Temp:  97.7 F (36.5 C)  97.6 F (36.4 C) 97.6 F (36.4 C)  TempSrc: Oral  Oral Oral  SpO2: 99%  100% 100%  Weight:    59.6 kg (131 lb 8 oz)  Height:    5' 6"  (1.676 m)   Eyes: PERRL, Pale conjunctivae  ENMT: Mucous membranes are moist. .  Neck: normal, supple, no masses, no thyromegaly Respiratory: clear to auscultation bilaterally, no wheezing, no crackles. Normal respiratory effort. No accessory muscle use.  Cardiovascular: Regular rate and rhythm, no murmurs / rubs / gallops. No extremity edema. 2+ pedal pulses. No carotid bruits.  Abdomen: no tenderness, no masses palpated. No hepatosplenomegaly. Bowel sounds positive.  Musculoskeletal: no clubbing / cyanosis. Good ROM, no contractures. Normal muscle tone.  Skin: no rashes, lesions, ulcers. No induration Neurologic: CN 2-12 grossly intact.Strength 5/5 in all 4.  Psychiatric: Normal judgment and insight. Alert and oriented x 3. Normal mood.    Labs on Admission: I have personally reviewed following labs and imaging studies  CBC:  Recent Labs Lab 04/03/16 1351 04/06/16 1542 04/07/16 0840  WBC 13.2* 16.2* 14.7*  NEUTROABS 10.8* 14,418* 10.8*  HGB 8.4* 7.4* 6.5*  HCT 26.0* 23.2* 20.4*  MCV 92.9 91.3 94.0  PLT 291 384 295   Basic Metabolic Panel:  Recent Labs Lab 04/03/16 1351 04/06/16 1542 04/07/16 0840  NA 139 137 137  K 4.2 5.1 4.2  CL 113* 109 109  CO2 19* 15* 21*  GLUCOSE 109* 150* 99  BUN 43* 46* 39*  CREATININE 1.55* 1.61* 1.64*  CALCIUM 8.8* 8.6 8.6*   GFR: Estimated Creatinine Clearance: 28.6 mL/min (by C-G formula based on SCr of 1.64 mg/dL (H)). Liver Function Tests:  Recent Labs Lab 04/03/16 1351 04/07/16 0840  AST 35 35  ALT 25 26  ALKPHOS 61 53  BILITOT 0.3 0.3  PROT 6.6 6.1*  ALBUMIN 3.3* 2.9*   No results for input(s): LIPASE, AMYLASE in the last 168 hours. No results for input(s): AMMONIA in the last 168 hours. Coagulation Profile: No results for input(s): INR, PROTIME in the last 168  hours. Cardiac Enzymes: No results for input(s): CKTOTAL, CKMB, CKMBINDEX, TROPONINI in the last 168 hours. BNP (last 3 results) No results for input(s): PROBNP in the last 8760 hours. HbA1C: No results for input(s): HGBA1C in the last 72 hours. CBG: No results for input(s): GLUCAP in the last 168 hours. Lipid Profile: No results for input(s): CHOL, HDL, LDLCALC, TRIG, CHOLHDL, LDLDIRECT in the last 72 hours. Thyroid Function Tests: No results for input(s): TSH, T4TOTAL, FREET4, T3FREE, THYROIDAB in the last 72 hours. Anemia Panel: No results for input(s): VITAMINB12, FOLATE, FERRITIN, TIBC, IRON, RETICCTPCT in the last 72 hours. Urine analysis:    Component Value Date/Time   COLORURINE AMBER (A) 10/31/2015 0633   APPEARANCEUR CLOUDY (A) 10/31/2015 0633   LABSPEC 1.017 10/31/2015 0633   PHURINE 6.0 10/31/2015 0633   GLUCOSEU NEGATIVE 10/31/2015 0633   HGBUR NEGATIVE 10/31/2015 0633   BILIRUBINUR NEGATIVE 10/31/2015 0633   KETONESUR 15 (A) 10/31/2015 0633   PROTEINUR 30 (A) 10/31/2015 0633   NITRITE NEGATIVE 10/31/2015 0633   LEUKOCYTESUR MODERATE (A) 10/31/2015 0633   Sepsis Labs: !!!!!!!!!!!!!!!!!!!!!!!!!!!!!!!!!!!!!!!!!!!! @LABRCNTIP (procalcitonin:4,lacticidven:4) )No results found for this or any previous visit (from the past 240 hour(s)).   Radiological Exams on Admission: No results found.  EKG: Independently reviewed. Sinus bradycardia with heart rate of 52/m. No ischemic changes.  Assessment/Plan Active Problems:   Anemia  # Normocytic anemia: Exact etiology unknown. Denied black colored stool and fecal occult blood tests negative. Patient does not have abdominal pain or any screening [place] to suggest any hematoma. After cardiac catheterization and stent placement patient was on multiple blood thinners including brillinta, Eliquis and aspirin. -Plan to transfuse 2 units of red blood cell -Check iron studies, TSH, reticulocyte, LDH, haptoglobin, vitamin B12  level. -Patient does not have thrombocytopenia to suggest any sign of TMA. -Monitor CBC. May need evaluation by hematologist depending on the test results.  #Coronary artery disease with recent stent placement: Recent cardiologist note reviewed from yesterday. Plan to start Plavix from today. I will hold Eliquis because of severe anemia today. For now I'll continue aspirin and Plavix because of recent cardiac stent placement. I consulted cardiologist to evaluate for anticoagulation and antiplatelets given recent stent placement and severe anemia. Patient does not have chest pain.  #  Dyspnea on exertion, generalized weakness, lightheadedness most likely contributed by severe anemia: Receiving blood transfusion. Checking TSH level. Consulted physical therapy, OT and case manager for safe discharge planning. Patient does not have focal neurological examination on physical exam.  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 I reviewed the patient's cardiologist note. Plan to continue amiodarone and metoprolol.I will lower the dose of metoprolol to half.  Continue to monitor in cardiac telemetry.  #Hypertension: Monitor blood pressure.  #Leukocytosis: Check urinalysis. Monitor CBC.  DVT prophylaxis: SCD. Currently on aspirin and Plavix. Holding systemic anticoagulation because of severe anemia. Code Status: Full code Family Communication: No family present at bedside Disposition Plan: Likely discharge home in 1 day Consults called: Consulted cardiologist Admission status: Observation.   Patricia Jackson Tanna Furry MD [facility] Pager [phone]  If 7PM-7AM, please contact night-coverage [email or web address] Password [email or web address]  04/07/2016, 11:12 AM

## 2016-04-07 NOTE — ED Notes (Signed)
CRITICAL VALUE ALERT  Critical value received:  Hgb 6.5  Date of notification:  04/07/16  Time of notification:  907  Critical value read back:Yes.    Nurse who received alert: Parthenia Ames  MD notified (1st page):  Dr. Lacinda Axon  Time of first page:  907  MD notified (2nd page):  Time of second page:  Responding MD:  Dr. Lacinda Axon  Time MD responded:  [phone]

## 2016-04-07 NOTE — Telephone Encounter (Signed)
Called pt, advised her to go either go to the ED or to see her PCP due to her low hemoglobin per phone call from Jory Sims, N.P. Pt voiced understanding and stated she will go to ED.

## 2016-04-07 NOTE — Progress Notes (Signed)
PT Cancellation Note  Patient Details Name: Patricia Jackson MRN: [id number] DOB: [birth year]   Cancelled Treatment:    Reason Eval/Treat Not Completed: Other (comment) (Pt recieving 2 units of blood; assessment will be more accurate tomorrow )   Rayetta Humphrey, PT CLT [phone] 04/07/2016, 2:12 PM

## 2016-04-07 NOTE — ED Provider Notes (Signed)
[facility] DEPT Provider Note   CSN: [id number] Arrival date & time: 04/07/16  3220   By signing my name below, I, Delton Prairie, attest that this documentation has been prepared under the direction and in the presence of Nat Christen, MD  Electronically Signed: Delton Prairie, ED Scribe. 04/07/16. 9:05 AM.   History   Chief Complaint Chief Complaint  Patient presents with  . low hemoglobin   The history is provided by the patient. No language interpreter was used.   HPI Comments:  Patricia Jackson is a 76 y.o. female, with a hx of CAD, HTN and COPD, who presents to the Emergency Department complaining of persistent SOB x several months. Her SOB is worse upon exertion. Pt states she visited her cardiologist yesterday, had blood work done, was told she had a low hemoglobin level (7.4) and advised to stop taking Brilinta and start taking Plavix. No alleviating factors noted. Pt denies chest pain, melena, any other associated symptoms and any other modifying factors at this time. She is ambulatory with mild difficulty due to SOB. Pt is on Eliquis and baby aspirin. Pt reports she has a cardiac stent placement on 03/15/16.   PCP: Wende Neighbors, MD Cardiologist: Dr. Carlyle Dolly   Past Medical History:  Diagnosis Date  . Breast cancer ([place])   . CAD in native artery 03/16/2016  . COPD (chronic obstructive pulmonary disease) ([place])   . Hypertension   . S/P angioplasty with stent 03/15/16 DES Resolute, pLCX 03/16/2016    Patient Active Problem List   Diagnosis Date Noted  . Anemia 04/07/2016  . CAD in native artery 03/16/2016  . S/P angioplasty with stent 03/15/16 DES Resolute, [place] 03/16/2016  . Acute coronary syndrome ([place])   . NSTEMI (non-ST elevated myocardial infarction) ([place]) 03/12/2016  . Shortness of breath   . Acute respiratory failure with hypoxia ([place])   . Hypophosphatemia   . 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[place]) 10/31/2015  . Acute exacerbation of chronic bronchitis ([place]) 10/31/2015    . COPD exacerbation ([place]) 10/31/2015  . Dyslipidemia 10/31/2015  . Elevated troponin 10/31/2015  . Atrial flutter with rapid ventricular response ([place]) 10/30/2015  . SOB (shortness of breath) 11/26/2013  . Back pain 11/26/2013  . Arm pain 11/26/2013  . Chest pain 11/26/2013  . Hypertension     Past Surgical History:  Procedure Laterality Date  . APPENDECTOMY    . BREAST SURGERY    . CARDIAC CATHETERIZATION N/A 03/15/2016   Procedure: Left Heart Cath and Coronary Angiography;  Surgeon: Belva Crome, MD;  Location: [place] [facility];  Service: Cardiovascular;  Laterality: N/A;  . CARDIAC CATHETERIZATION N/A 03/15/2016   Procedure: Coronary Stent Intervention;  Surgeon: Belva Crome, MD;  Location: [place] [facility];  Service: Cardiovascular;  Laterality: N/A;    OB History    Gravida Para Term Preterm AB Living             1   SAB TAB Ectopic Multiple Live Births                   Home Medications    Prior to Admission medications   Medication Sig Start Date End Date Taking? Authorizing Provider  acetaminophen (TYLENOL) 325 MG tablet Take 2 tablets (650 mg total) by mouth every 4 (four) hours as needed for headache or mild pain. 03/16/16   Isaiah Serge, NP  albuterol (PROVENTIL) (2.5 MG/3ML) 0.083% nebulizer solution Take 3 mLs (2.5 mg total) by nebulization every 6 (six)  hours as needed for wheezing or shortness of breath. 11/08/15   Almyra Deforest, PA  amiodarone (PACERONE) 200 MG tablet Take 1 tablet (200 mg total) by mouth daily. 11/08/15   Almyra Deforest, PA  amLODipine (NORVASC) 5 MG tablet Take 1 tablet (5 mg total) by mouth daily. 03/16/16   Isaiah Serge, NP  amoxicillin (AMOXIL) 500 MG capsule Take 1 capsule (500 mg total) by mouth 3 (three) times daily. 04/03/16   Milton Ferguson, MD  apixaban (ELIQUIS) 5 MG TABS tablet Take 1 tablet (5 mg total) by mouth 2 (two) times daily. Patient taking differently: Take 5 mg by mouth daily.  12/09/15   Arnoldo Lenis, MD  aspirin 81 MG  chewable tablet Chew 1 tablet (81 mg total) by mouth daily. 03/16/16   Isaiah Serge, NP  azithromycin (ZITHROMAX) 250 MG tablet Take 2 tablets first day and then 1 tablet daily after that 04/03/16   Milton Ferguson, MD  buPROPion (WELLBUTRIN XL) 300 MG 24 hr tablet Take 1 tablet by mouth daily. 10/10/15   Historical Provider, MD  clopidogrel (PLAVIX) 75 MG tablet Take 1 tablet (75 mg total) by mouth daily. 04/06/16   Lendon Colonel, NP  HYDROcodone-acetaminophen (NORCO/VICODIN) 5-325 MG tablet Take 1 tablet by mouth every 6 (six) hours as needed for moderate pain. 04/03/16   Milton Ferguson, MD  levocetirizine (XYZAL) 5 MG tablet Take 5 mg by mouth every evening.    Historical Provider, MD  meclizine (ANTIVERT) 25 MG tablet TAKE ONE TABLET BY MOUTH THREE TIMES DAILY AS NEEDED FOR DIZZINESS 01/27/16   Arnoldo Lenis, MD  metoprolol succinate (TOPROL-XL) 100 MG 24 hr tablet Take 1 tablet (100 mg total) by mouth daily. Take with or immediately following a meal. 03/17/16   Isaiah Serge, NP  nitroGLYCERIN (NITROSTAT) 0.4 MG SL tablet Place 1 tablet (0.4 mg total) under the tongue once. 03/16/16 03/16/16  Isaiah Serge, NP  ondansetron (ZOFRAN ODT) 4 MG disintegrating tablet 4mg  ODT q4 hours prn nausea/vomit 04/03/16   Milton Ferguson, MD  predniSONE (DELTASONE) 20 MG tablet 2 tabs po daily x 3 days 04/03/16   Milton Ferguson, MD  simvastatin (ZOCOR) 20 MG tablet Take 20 mg by mouth daily at 6 PM.     Historical Provider, MD  [facility] 160-4.5 MCG/ACT inhaler Inhale 1 puff into the lungs daily. 02/16/16   Marshell Garfinkel, MD    Family History Family History  Problem Relation Age of Onset  . Heart disease Mother   . COPD Father     Social History Social History  Substance Use Topics  . Smoking status: Former Smoker    Packs/day: 1.00    Years: 50.00    Types: Cigarettes    Start date: 12/15/1965    Quit date: 10/29/2015  . Smokeless tobacco: Never Used  . Alcohol use No     Allergies     Sulfa antibiotics   Review of Systems Review of Systems  Respiratory: Positive for shortness of breath.   Cardiovascular: Negative for chest pain.  Gastrointestinal: Negative for blood in stool.  All other systems reviewed and are negative.   Physical Exam Updated Vital Signs BP 136/57   Pulse (!) 55   Temp 97.7 F (36.5 C) (Oral)   Resp 20   Ht 5\' 6"  (1.676 m)   Wt 136 lb (61.7 kg)   SpO2 99%   BMI 21.95 kg/m   Physical Exam  Constitutional: She is oriented to person,  place, and time. She appears well-developed and well-nourished.  Pale. NAD  HENT:  Head: Normocephalic and atraumatic.  Eyes: Conjunctivae are normal.  Neck: Neck supple.  Cardiovascular: Normal rate and regular rhythm.   Pulmonary/Chest: Effort normal and breath sounds normal.  Abdominal: Soft. Bowel sounds are normal.  Genitourinary:  Genitourinary Comments: No masses. Heme negative  Musculoskeletal: Normal range of motion.  Neurological: She is alert and oriented to person, place, and time.  Skin: Skin is warm and dry.  Psychiatric: She has a normal mood and affect. Her behavior is normal.  Nursing note and vitals reviewed.    ED Treatments / Results  DIAGNOSTIC STUDIES:  Oxygen Saturation is 99% on RA, normal by my interpretation.    COORDINATION OF CARE:  8:58 AM Discussed treatment plan with pt at bedside and pt agreed to plan.  Labs (all labs ordered are listed, but only abnormal results are displayed) Labs Reviewed  CBC WITH DIFFERENTIAL/PLATELET - Abnormal; Notable for the following:       Result Value   WBC 14.7 (*)    RBC 2.17 (*)    Hemoglobin 6.5 (*)    HCT 20.4 (*)    Neutro Abs 10.8 (*)    Monocytes Absolute 1.2 (*)    All other components within normal limits  COMPREHENSIVE METABOLIC PANEL - Abnormal; Notable for the following:    CO2 21 (*)    BUN 39 (*)    Creatinine, Ser 1.64 (*)    Calcium 8.6 (*)    Total Protein 6.1 (*)    Albumin 2.9 (*)    GFR calc non Af  Amer 30 (*)    GFR calc Af Amer 35 (*)    All other components within normal limits  URINALYSIS, ROUTINE W REFLEX MICROSCOPIC  POC OCCULT BLOOD, ED  POC OCCULT BLOOD, ED  TYPE AND SCREEN  PREPARE RBC (CROSSMATCH)  ABO/RH    EKG  EKG Interpretation None       Radiology No results found.  Procedures Procedures (including critical care time)  Medications Ordered in ED Medications  0.9 %  sodium chloride infusion (10 mL/hr Intravenous New Bag/Given 04/07/16 0927)     Initial Impression / Assessment and Plan / ED Course  I have reviewed the triage vital signs and the nursing notes.  Pertinent labs & imaging results that were available during my care of the patient were reviewed by me and considered in my medical decision making (see chart for details).  Clinical Course     Patient presents with dyspnea on exertion. Hemoglobin yesterday was in the mid 7 range. Today 6.5. Hemoglobin was normal one month ago. She is Eliquis and baby aspirin. Rectal exam negative. Admit for 2 units of blood  Final Clinical Impressions(s) / ED Diagnoses   Final diagnoses:  Anemia, unspecified type    New Prescriptions New Prescriptions   No medications on file  I personally performed the services described in this documentation, which was scribed in my presence. The recorded information has been reviewed and is accurate.      Nat Christen, MD 04/07/16 [phone]

## 2016-04-07 NOTE — Consult Note (Signed)
Cardiology Consultation   Patient ID: Patricia Jackson; [id number]; 12-04-1940   Admit date: 04/07/2016 Date of Consult: 04/07/2016  Referring MD: Dr. Carolin Sicks Cardiologist: Dr. Harl Bowie Consulting Cardiologist: Dr. Harrington Challenger  Patient Care Team: Celene Squibb, MD as PCP - General (Internal Medicine) Arnoldo Lenis, MD as Consulting Physician (Cardiology) Marshell Garfinkel, MD as Consulting Physician (Pulmonary Disease)    Reason for Consultation: Anemia recent stent   History of Present Illness: Patricia Jackson is a 76 y.o. female with a hx of of CAD status post DES to the proximal circumflex and mid circumflex 03/15/16, atrial flutter, COPD, and breast cancer. She was started on aspirin, Brilinta an Eliquis with plans to stop aspirin one month. She saw Jory Sims yesterday for post hospital follow-up and was complaining of significant fatigue as well as dyspnea on exertion. She had a recent COPD exacerbation and had been treated with prednisone, amoxicillin and Zofran. A stat CBC was ordered and her hemoglobin was 6.5. She was told to see to the emergency room. She denied any bleeding, tarry stools or melena.  Patient is currently being transfused. She denies any chest pain, palpitations, dyspnea, dyspnea on exertion, dizziness or presyncope. Her main symptom was fatigue. She denies any bleeding, pain, melena.  Past Medical History:  Diagnosis Date  . Breast cancer ([place])   . CAD in native artery 03/16/2016  . COPD (chronic obstructive pulmonary disease) ([place])   . Hypertension   . S/P angioplasty with stent 03/15/16 DES [place], [place] 03/16/2016    Past Surgical History:  Procedure Laterality Date  . APPENDECTOMY    . BREAST SURGERY    . CARDIAC CATHETERIZATION N/A 03/15/2016   Procedure: Left Heart Cath and Coronary Angiography;  Surgeon: Belva Crome, MD;  Location: [facility];  Service: Cardiovascular;  Laterality: N/A;  . CARDIAC CATHETERIZATION N/A 03/15/2016   Procedure: Coronary Stent Intervention;  Surgeon: Belva Crome, MD;  Location: [facility];  Service: Cardiovascular;  Laterality: N/A;      Home Meds: Prior to Admission medications   Medication Sig Start Date End Date Taking? Authorizing Provider  acetaminophen (TYLENOL) 325 MG tablet Take 2 tablets (650 mg total) by mouth every 4 (four) hours as needed for headache or mild pain. 03/16/16  Yes Isaiah Serge, NP  albuterol (PROVENTIL) (2.5 MG/3ML) 0.083% nebulizer solution Take 3 mLs (2.5 mg total) by nebulization every 6 (six) hours as needed for wheezing or shortness of breath. 11/08/15  Yes Almyra Deforest, PA  amiodarone (PACERONE) 200 MG tablet Take 1 tablet (200 mg total) by mouth daily. 11/08/15  Yes Almyra Deforest, PA  amLODipine (NORVASC) 5 MG tablet Take 1 tablet (5 mg total) by mouth daily. 03/16/16  Yes Isaiah Serge, NP  amoxicillin (AMOXIL) 500 MG capsule Take 1 capsule (500 mg total) by mouth 3 (three) times daily. 04/03/16  Yes Milton Ferguson, MD  apixaban (ELIQUIS) 5 MG TABS tablet Take 1 tablet (5 mg total) by mouth 2 (two) times daily. 12/09/15  Yes Arnoldo Lenis, MD  aspirin 81 MG chewable tablet Chew 1 tablet (81 mg total) by mouth daily. 03/16/16  Yes Isaiah Serge, NP  buPROPion (WELLBUTRIN XL) 300 MG 24 hr tablet Take 1 tablet by mouth daily. 10/10/15  Yes Historical Provider, MD  clopidogrel (PLAVIX) 75 MG tablet Take 1 tablet (75 mg total) by mouth daily. 04/06/16  Yes Lendon Colonel, NP  HYDROcodone-acetaminophen (NORCO/VICODIN) 5-325 MG tablet Take 1 tablet by mouth every  6 (six) hours as needed for moderate pain. 04/03/16  Yes Milton Ferguson, MD  levocetirizine (XYZAL) 5 MG tablet Take 5 mg by mouth every evening.   Yes Historical Provider, MD  meclizine (ANTIVERT) 25 MG tablet TAKE ONE TABLET BY MOUTH THREE TIMES DAILY AS NEEDED FOR DIZZINESS 01/27/16  Yes Arnoldo Lenis, MD  metoprolol succinate (TOPROL-XL) 100 MG 24 hr tablet Take 1 tablet (100 mg total) by mouth  daily. Take with or immediately following a meal. 03/17/16  Yes Isaiah Serge, NP  nitroGLYCERIN (NITROSTAT) 0.4 MG SL tablet Place 1 tablet (0.4 mg total) under the tongue once. 03/16/16 04/07/16 Yes Isaiah Serge, NP  ondansetron (ZOFRAN ODT) 4 MG disintegrating tablet 49m ODT q4 hours prn nausea/vomit 04/03/16  Yes JMilton Ferguson MD  simvastatin (ZOCOR) 20 MG tablet Take 20 mg by mouth daily at 6 PM.    Yes Historical Provider, MD  SYMBICORT 160-4.5 MCG/ACT inhaler Inhale 1 puff into the lungs daily. 02/16/16  Yes Praveen Mannam, MD  azithromycin (ZITHROMAX) 250 MG tablet Take 2 tablets first day and then 1 tablet daily after that Patient not taking: Reported on 04/07/2016 04/03/16   JMilton Ferguson MD  predniSONE (DELTASONE) 20 MG tablet 2 tabs po daily x 3 days Patient not taking: Reported on 04/07/2016 04/03/16   JMilton Ferguson MD    Current Medications: . amiodarone  200 mg Oral Daily  . amLODipine  5 mg Oral Daily  . aspirin  81 mg Oral Daily  . buPROPion  300 mg Oral Daily  . clopidogrel  75 mg Oral Daily  . docusate sodium  100 mg Oral BID  . loratadine  10 mg Oral q1800  . [START ON 04/08/2016] metoprolol succinate  50 mg Oral Daily  . mometasone-formoterol  2 puff Inhalation BID  . nitroGLYCERIN  0.4 mg Sublingual Once  . simvastatin  20 mg Oral q1800     Allergies:    Allergies  Allergen Reactions  . Sulfa Antibiotics Hives    Social History:   The patient  reports that she quit smoking about 5 months ago. Her smoking use included Cigarettes. She started smoking about 50 years ago. She has a 50.00 pack-year smoking history. She has never used smokeless tobacco. She reports that she does not drink alcohol or use drugs.    Family History:   The patient's family history includes COPD in her father; Heart disease in her mother.   ROS:  Please see the history of present illness.  Review of Systems  Constitution: Positive for weakness and malaise/fatigue.  HENT: Negative.     Eyes: Negative.   Cardiovascular: Positive for dyspnea on exertion.  Respiratory: Negative.   Hematologic/Lymphatic: Bruises/bleeds easily.  Musculoskeletal: Negative.  Negative for joint pain.  Gastrointestinal: Negative.   Genitourinary: Negative.    All other ROS reviewed and negative.      Vital Signs: Blood pressure (!) 104/49, pulse (!) 51, temperature 97.7 F (36.5 C), temperature source Oral, resp. rate 18, height 5' 6"  (1.676 m), weight 131 lb 8 oz (59.6 kg), SpO2 100 %.   PHYSICAL EXAM: General:  Well nourished, well developed, in no acute distress  HEENT: normal Lymph: no adenopathy Neck: no JVD Endocrine:  No thryomegaly Vascular: No carotid bruits; FA pulses 2+ bilaterally without bruits  Cardiac:  RRR; normal S1, S2; no murmur, rub, bruit, thrill, or heave Lungs:  clear to auscultation bilaterally, no wheezing, rhonchi or rales  Abd: soft, nontender, no hepatomegaly  Ext: no edema, Good distal pulses bilaterally Musculoskeletal:  No deformities, BUE and BLE strength normal and equal Skin: warm and dry  Neuro:  CNs 2-12 intact, no focal abnormalities noted Psych:  Normal affect    EKG: Sinus bradycardia, old anterior septal infarct  Telemetry: Sinus bradycardia  Labs: No results for input(s): CKTOTAL, CKMB, TROPONINI in the last 72 hours. Lab Results  Component Value Date   WBC 14.7 (H) 04/07/2016   HGB 6.5 (LL) 04/07/2016   HCT 20.4 (L) 04/07/2016   MCV 94.0 04/07/2016   PLT 297 04/07/2016    Recent Labs Lab 04/07/16 0840  NA 137  K 4.2  CL 109  CO2 21*  BUN 39*  CREATININE 1.64*  CALCIUM 8.6*  PROT 6.1*  BILITOT 0.3  ALKPHOS 53  ALT 26  AST 35  GLUCOSE 99   Lab Results  Component Value Date   CHOL 159 03/13/2016   HDL 70 03/13/2016   LDLCALC 76 03/13/2016   TRIG 63 03/13/2016   Lab Results  Component Value Date   DDIMER 0.41 04/03/2016    Radiology/Studies:  Dg Chest 2 View  Result Date: 04/03/2016 CLINICAL DATA:  SOB  today, hx of COPD, pt stated she had cardiac stent placement Mar 16, 2016 EXAM: CHEST  2 VIEW COMPARISON:  03/12/2016 FINDINGS: Cardiac silhouette is normal in size. No mediastinal or hilar masses. No evidence of adenopathy. Lungs are hyperexpanded. Mild reticular scarring at the lung apices and anterior lung bases. Lungs otherwise clear. No pleural effusion.  No pneumothorax. Skeletal structures are demineralized but grossly intact. IMPRESSION: 1. No acute cardiopulmonary disease. 2. COPD. Electronically Signed   By: Lajean Manes M.D.   On: 04/03/2016 14:18   Dg Chest Portable 1 View  Result Date: 03/12/2016 CLINICAL DATA:  Upper back pain.  Acute coronary syndrome EXAM: PORTABLE CHEST 1 VIEW COMPARISON:  12/03/2015 FINDINGS: There is hyperinflation of the lungs compatible with COPD. Heart is borderline in size. No confluent opacities, effusions or acute bony abnormality. IMPRESSION: COPD.  No active disease. Electronically Signed   By: Rolm Baptise M.D.   On: 03/12/2016 08:52     PROBLEM LIST:  Active Problems:   Anemia   Sinus bradycardia   Hx of heart artery stent     ASSESSMENT AND PLAN:  Profound anemia hemoglobin 6.5 in the setting of Brilinta, aspirin and Eliquis. Currently being transfused. Aspirin stopped. She probably have GI see. We'll need to determine what anticoagulation she can be on given recent stents. Currently on Plavix  CAD status post DES to the proximal and mid circumflex 03/15/16. Probably okay to stop aspirin in given her profound anemia. No angina  PAF on Eliquis and amiodarone. Bradycardic in the 50s. Normal sinus rhythm  COPD stable Signed, Ermalinda Barrios, PA-C  04/07/2016 12:19 PM   I have seen and examined patient  I agree with findings as noted above by Gerrianne Scale. Pt with resent NSTEMI in De (minimal bump in troponin) underwent PTCA/DES  Placement Home on ASA/Brilinita and Eliquis  Now with profound anemia  Currently receiving pRBCs ON exam, pt is  comfortable  LUngs are CTA  Cardiac RRR  No S3  Ext without edema  Agree with holding ASA and Eliquis and switching to Plavix.  I would continue this  If possible. I do think pt should be seen by GI to define source. Once Hgb stabilizes, after eval would be good to add back 81 mg ASA instead of Eliquis. I have  reviewed case with interventional colleagues  Keep on tele.  COntnue amio  Remains in SR

## 2016-04-08 ENCOUNTER — Encounter (HOSPITAL_COMMUNITY): Payer: Self-pay | Admitting: Gastroenterology

## 2016-04-08 ENCOUNTER — Telehealth: Payer: Self-pay | Admitting: Gastroenterology

## 2016-04-08 DIAGNOSIS — Z955 Presence of coronary angioplasty implant and graft: Secondary | ICD-10-CM | POA: Diagnosis not present

## 2016-04-08 DIAGNOSIS — R1310 Dysphagia, unspecified: Secondary | ICD-10-CM | POA: Diagnosis not present

## 2016-04-08 DIAGNOSIS — D649 Anemia, unspecified: Secondary | ICD-10-CM

## 2016-04-08 DIAGNOSIS — R1319 Other dysphagia: Secondary | ICD-10-CM

## 2016-04-08 LAB — CBC
HCT: 29.6 % — ABNORMAL LOW (ref 36.0–46.0)
Hemoglobin: 9.6 g/dL — ABNORMAL LOW (ref 12.0–15.0)
MCH: 29.2 pg (ref 26.0–34.0)
MCHC: 32.4 g/dL (ref 30.0–36.0)
MCV: 90 fL (ref 78.0–100.0)
Platelets: 264 10*3/uL (ref 150–400)
RBC: 3.29 MIL/uL — ABNORMAL LOW (ref 3.87–5.11)
RDW: 15.3 % (ref 11.5–15.5)
WBC: 11.3 10*3/uL — ABNORMAL HIGH (ref 4.0–10.5)

## 2016-04-08 LAB — BASIC METABOLIC PANEL
Anion gap: 6 (ref 5–15)
BUN: 39 mg/dL — ABNORMAL HIGH (ref 6–20)
CO2: 19 mmol/L — ABNORMAL LOW (ref 22–32)
Calcium: 8.3 mg/dL — ABNORMAL LOW (ref 8.9–10.3)
Chloride: 113 mmol/L — ABNORMAL HIGH (ref 101–111)
Creatinine, Ser: 1.48 mg/dL — ABNORMAL HIGH (ref 0.44–1.00)
GFR calc Af Amer: 39 mL/min — ABNORMAL LOW (ref >60)
GFR calc non Af Amer: 34 mL/min — ABNORMAL LOW (ref >60)
Glucose, Bld: 85 mg/dL (ref 65–99)
Potassium: 3.9 mmol/L (ref 3.5–5.1)
Sodium: 138 mmol/L (ref 135–145)

## 2016-04-08 LAB — HAPTOGLOBIN: Haptoglobin: 338 mg/dL — ABNORMAL HIGH (ref 34–200)

## 2016-04-08 MED ORDER — METOPROLOL SUCCINATE ER 50 MG PO TB24: 50.0000 mg | ORAL_TABLET | Freq: Every day | ORAL | 0 refills | Status: DC

## 2016-04-08 MED ORDER — FERROUS SULFATE 325 (65 FE) MG PO TABS: 325.0000 mg | ORAL_TABLET | Freq: Every day | ORAL | 0 refills | Status: DC

## 2016-04-08 NOTE — Telephone Encounter (Signed)
Patient being discharged from hospital today. She needs an office visit with me or Dr. Gala Romney in 2 weeks, plans for colonoscopy/EGD electively for profound anemia/heme negative, dysphagia. Patient anticoagulated due to recent MI, drug-eluting stent as well as history of a flutter. Going home from the hospital on Plavix only.  Cardiology does prefer aspirin to be added back to the regimen as soon as possible.

## 2016-04-08 NOTE — Evaluation (Signed)
Occupational Therapy Evaluation Patient Details Name: Patricia Jackson MRN: [id number] DOB: 1941/01/15 Today's Date: 04/08/2016    History of Present Illness HPI: Patricia Jackson is a 75 y.o. female with medical history significant of hypertension, dyslipidemia, COPD not on home oxygen, NSTEMI status post cardiac catheterization and stent placement on December 11 at [facility], on a Eliquis,  aspirin and Brilinta, went to the cardiologist office yesterday for hospital discharge follow-up appointment blood test were done. Today patient was advised to come to the hospital for low hemoglobin level of 6.5. Patient reported worsening shortness of breath especially dyspnea on exertion associated with generalized weakness, lightheadedness while walking. She feels weak and fatigued. Denied headache, fever, chills, nausea, vomiting, chest pain, abdominal pain. Denied black colored stool or hematuria. Patient however reports constipation. She quit smoking since stent placement. No alcohol abuse. Lives with her family   Clinical Impression   Pt awake, alert, oriented x4 this am, agreeable to OT evaluation. Pt demonstrates independence in ADL completion and functional mobility tasks. BUE strength is WNL, pt does report difficulty with fatigue during showering tasks, recommend tub/shower seat to improve activity tolerance and safety. No further OT services required at this time as pt is at baseline functioning.     Follow Up Recommendations  No OT follow up    Equipment Recommendations  Tub/shower seat       Precautions / Restrictions Precautions Precautions: None Restrictions Weight Bearing Restrictions: No      Mobility Bed Mobility Overal bed mobility: Independent                Transfers Overall transfer level: Independent                         ADL Overall ADL's : Independent;At baseline                                             Vision  Vision Assessment?: No apparent visual deficits          Pertinent Vitals/Pain Pain Assessment: 0-10 Pain Score: 5  Pain Location: Ache Pain Intervention(s): Limited activity within patient's tolerance;Monitored during session;Repositioned     Hand Dominance Right   Extremity/Trunk Assessment Upper Extremity Assessment Upper Extremity Assessment: Overall WFL for tasks assessed   Lower Extremity Assessment Lower Extremity Assessment: Defer to PT evaluation   Cervical / Trunk Assessment Cervical / Trunk Assessment: Normal   Communication Communication Communication: No difficulties   Cognition Arousal/Alertness: Awake/alert Behavior During Therapy: WFL for tasks assessed/performed Overall Cognitive Status: Within Functional Limits for tasks assessed                                Home Living Family/patient expects to be discharged to:: Private residence Living Arrangements: Children Available Help at Discharge: Family Type of Home: House Home Access: Stairs to enter Technical brewer of Steps: 2 Entrance Stairs-Rails: Right Home Layout: One level     Bathroom Shower/Tub: Teacher, early years/pre: Standard Bathroom Accessibility: Yes   Home Equipment: None          Prior Functioning/Environment Level of Independence: Independent  End of Session    Activity Tolerance: Patient tolerated treatment well Patient left: in bed;with call bell/phone within reach   Time: 0932-0946 OT Time Calculation (min): 14 min Charges:  OT General Charges $OT Visit: 1 Procedure OT Evaluation $OT Eval Low Complexity: 1 Procedure G-Codes: OT G-codes **NOT FOR INPATIENT CLASS** Functional Assessment Tool Used: clinical judgement Functional Limitation: Self care Self Care Current Status (V7616): At least 1 percent but less than 20 percent impaired, limited or restricted Self Care Goal Status (W7371): At least 1  percent but less than 20 percent impaired, limited or restricted Self Care Discharge Status [phone]): At least 1 percent but less than 20 percent impaired, limited or restricted   Guadelupe Sabin, OTR/L  [phone] 04/08/2016, 10:05 AM

## 2016-04-08 NOTE — Progress Notes (Signed)
    Consult note from Dr Harrington Challenger reviewed. Agree with holding ASA and eliquis in setting of severe anemia, given recent stent would continue plavix if possible. Continue to follow Hgb trends. F/u GI recs. No additional cardiac recs at this time, we will continue to monitor.     Carlyle Dolly, M.D.,

## 2016-04-08 NOTE — Consult Note (Signed)
Referring Provider: Rosita Fire, MD Primary Care Physician:  Wende Neighbors, MD Primary Gastroenterologist:  Garfield Cornea, MD  Reason for Consultation:  Severe anemia, heme negative  HPI: Patricia Jackson is a 76 y.o. female with history of hypertension, atrial flutter (on Eliquis since 11/2015), COPD, remote breast cancer, CAD with recent NSTEMI with cardiac catheterization 03/15/2016 requiring stenting. Started on aspirin, Brilinta, in addition to her Eliquis for one month with plans to stop aspirin therapy after that. Patient was seen in the ED on December 30 with complaints of shortness of breath. She has a history of COPD as well. She was treated for respiratory infection and COPD exacerbation with amoxicillin, prednisone, Zofran, hydrocodone. She was seen in follow-up by her cardiologist on January 2. She complained of progressive fatigue, dyspnea on exertion, shortness of breath. Unable to ambulate more than 1 or 2 feet without getting short of breath. Her Brilinta was switched to Plavix for ?side effect of SOB, she had stat CBC and her hgb was 7.4. Down from 8.4 one week prior and 12.6 three weeks prior. Advised to go to the ED.  Patient presented to emergency department, her hemoglobin was 6.5 at that time. She was Hemoccult negative on digital rectal exam. In the ED her Haptoglobin elevated at 338, iron 21, iron saturations 8%, TIBC 274, ferritin 161, B12 375. She has received 2 units of packed blood cells this admission, hemoglobin today is 9.6.  Patient reports her last dose of Brilinta and Eliquis were 04/06/16. Chronically she is constipated. Denies any rectal bleeding or melena. No abdominal pain. States she had a nosebleed 1 week ago. One significant nosebleed, since then has been fairly trivial. Denies typical heartburn. She does complain of sensation of food sticking in the lower chest with meals. Doesn't really seem to matter what she eats. This is been going on for quite some time  but she has failed to bring it to her providers attention. Denies nausea or vomiting. Remote colonoscopy, she believes over 10 years ago. No prior upper endoscopy.   Patient has been seen by cardiology during this admission. They have advised holding aspirin and Eliquis and switching to Plavix. Brilinta stopped as outpatient on 04/06/16. After GI evaluation, cardiology would like to add back 81mg  aspirin in place of the Eliquis.   Prior to Admission medications   Medication Sig Start Date End Date Taking? Authorizing Provider  acetaminophen (TYLENOL) 325 MG tablet Take 2 tablets (650 mg total) by mouth every 4 (four) hours as needed for headache or mild pain. 03/16/16  Yes Isaiah Serge, NP  albuterol (PROVENTIL) (2.5 MG/3ML) 0.083% nebulizer solution Take 3 mLs (2.5 mg total) by nebulization every 6 (six) hours as needed for wheezing or shortness of breath. 11/08/15  Yes Almyra Deforest, PA  amiodarone (PACERONE) 200 MG tablet Take 1 tablet (200 mg total) by mouth daily. 11/08/15  Yes Almyra Deforest, PA  amLODipine (NORVASC) 5 MG tablet Take 1 tablet (5 mg total) by mouth daily. 03/16/16  Yes Isaiah Serge, NP  amoxicillin (AMOXIL) 500 MG capsule Take 1 capsule (500 mg total) by mouth 3 (three) times daily. 04/03/16  Yes Milton Ferguson, MD  apixaban (ELIQUIS) 5 MG TABS tablet Take 1 tablet (5 mg total) by mouth 2 (two) times daily. 12/09/15  Yes Arnoldo Lenis, MD  aspirin 81 MG chewable tablet Chew 1 tablet (81 mg total) by mouth daily. 03/16/16  Yes Isaiah Serge, NP  buPROPion (WELLBUTRIN XL) 300 MG 24 hr  tablet Take 1 tablet by mouth daily. 10/10/15  Yes Historical Provider, MD  clopidogrel (PLAVIX) 75 MG tablet Take 1 tablet (75 mg total) by mouth daily. 04/06/16  Yes Lendon Colonel, NP  HYDROcodone-acetaminophen (NORCO/VICODIN) 5-325 MG tablet Take 1 tablet by mouth every 6 (six) hours as needed for moderate pain. 04/03/16  Yes Milton Ferguson, MD  levocetirizine (XYZAL) 5 MG tablet Take 5 mg by mouth every  evening.   Yes Historical Provider, MD  meclizine (ANTIVERT) 25 MG tablet TAKE ONE TABLET BY MOUTH THREE TIMES DAILY AS NEEDED FOR DIZZINESS 01/27/16  Yes Arnoldo Lenis, MD  metoprolol succinate (TOPROL-XL) 100 MG 24 hr tablet Take 1 tablet (100 mg total) by mouth daily. Take with or immediately following a meal. 03/17/16  Yes Isaiah Serge, NP  nitroGLYCERIN (NITROSTAT) 0.4 MG SL tablet Place 1 tablet (0.4 mg total) under the tongue once. 03/16/16 04/07/16 Yes Isaiah Serge, NP  ondansetron (ZOFRAN ODT) 4 MG disintegrating tablet 4mg  ODT q4 hours prn nausea/vomit 04/03/16  Yes Milton Ferguson, MD  simvastatin (ZOCOR) 20 MG tablet Take 20 mg by mouth daily at 6 PM.    Yes Historical Provider, MD  SYMBICORT 160-4.5 MCG/ACT inhaler Inhale 1 puff into the lungs daily. 02/16/16  Yes Praveen Mannam, MD  azithromycin (ZITHROMAX) 250 MG tablet Take 2 tablets first day and then 1 tablet daily after that Patient not taking: Reported on 04/07/2016 04/03/16   Milton Ferguson, MD  predniSONE (DELTASONE) 20 MG tablet 2 tabs po daily x 3 days Patient not taking: Reported on 04/07/2016 04/03/16   Milton Ferguson, MD    Current Facility-Administered Medications  Medication Dose Route Frequency Provider Last Rate Last Dose  . acetaminophen (TYLENOL) tablet 650 mg  650 mg Oral Q6H PRN Dron Tanna Furry, MD       Or  . acetaminophen (TYLENOL) suppository 650 mg  650 mg Rectal Q6H PRN Dron Tanna Furry, MD      . albuterol (PROVENTIL) (2.5 MG/3ML) 0.083% nebulizer solution 2.5 mg  2.5 mg Nebulization Q6H PRN Dron Tanna Furry, MD      . amiodarone (PACERONE) tablet 200 mg  200 mg Oral Daily Dron Tanna Furry, MD   200 mg at 04/07/16 1115  . amLODipine (NORVASC) tablet 5 mg  5 mg Oral Daily Dron Tanna Furry, MD   5 mg at 04/07/16 1115  . aspirin chewable tablet 81 mg  81 mg Oral Daily Dron Tanna Furry, MD   81 mg at 04/07/16 1115  . bisacodyl (DULCOLAX) EC tablet 5 mg  5 mg Oral Daily PRN Dron Tanna Furry, MD      . buPROPion (WELLBUTRIN XL) 24 hr tablet 300 mg  300 mg Oral Daily Dron Tanna Furry, MD   300 mg at 04/07/16 1200  . clopidogrel (PLAVIX) tablet 75 mg  75 mg Oral Daily Dron Tanna Furry, MD   75 mg at 04/07/16 1200  . docusate sodium (COLACE) capsule 100 mg  100 mg Oral BID Dron Tanna Furry, MD   100 mg at 04/07/16 2203  . HYDROcodone-acetaminophen (NORCO/VICODIN) 5-325 MG per tablet 1 tablet  1 tablet Oral Q6H PRN Dron Tanna Furry, MD   1 tablet at 04/07/16 1651  . loratadine (CLARITIN) tablet 10 mg  10 mg Oral q1800 Dron Tanna Furry, MD   10 mg at 04/07/16 1649  . meclizine (ANTIVERT) tablet 25 mg  25 mg Oral BID PRN Dron Tanna Furry, MD      .  metoprolol succinate (TOPROL-XL) 24 hr tablet 50 mg  50 mg Oral Daily Dron Tanna Furry, MD      . mometasone-formoterol [facility]) 200-5 MCG/ACT inhaler 2 puff  2 puff Inhalation BID Dron Tanna Furry, MD      . nitroGLYCERIN (NITROSTAT) SL tablet 0.4 mg  0.4 mg Sublingual Once Dron Tanna Furry, MD      . ondansetron [facility]) tablet 4 mg  4 mg Oral Q6H PRN Dron Tanna Furry, MD       Or  . ondansetron [facility]) injection 4 mg  4 mg Intravenous Q6H PRN Dron Tanna Furry, MD      . simvastatin (ZOCOR) tablet 20 mg  20 mg Oral q1800 Dron Tanna Furry, MD   20 mg at 04/07/16 1649    Allergies as of 04/07/2016 - Review Complete 04/07/2016  Allergen Reaction Noted  . Sulfa antibiotics Hives 08/02/2013    Past Medical History:  Diagnosis Date  . Atrial flutter ([place])    eliquis since 11/2015  . Breast cancer ([place])    remote  . CAD in native artery 03/16/2016  . COPD (chronic obstructive pulmonary disease) ([place])   . Hypertension   . S/P angioplasty with stent 03/15/16 DES [place], [place] 03/16/2016    Past Surgical History:  Procedure Laterality Date  . APPENDECTOMY    . BREAST SURGERY    . CARDIAC CATHETERIZATION N/A 03/15/2016   Procedure: Left Heart Cath and Coronary Angiography;   Surgeon: Belva Crome, MD;  Location: [place] [facility];  Service: Cardiovascular;  Laterality: N/A;  . CARDIAC CATHETERIZATION N/A 03/15/2016   Procedure: Coronary Stent Intervention;  Surgeon: Belva Crome, MD;  Location: [place] [facility];  Service: Cardiovascular;  Laterality: N/A;  . COLONOSCOPY     remote    Family History  Problem Relation Age of Onset  . Heart disease Mother   . COPD Father   . Cancer Sister     unknown primary  . Cancer Brother     unknown primary  . Colon cancer Neg Hx     Social History   Social History  . Marital status: Divorced    Spouse name: N/A  . Number of children: N/A  . Years of education: N/A   Occupational History  . Not on file.   Social History Main Topics  . Smoking status: Former Smoker    Packs/day: 1.00    Years: 50.00    Types: Cigarettes    Start date: 12/15/1965    Quit date: 10/29/2015  . Smokeless tobacco: Never Used  . Alcohol use No  . Drug use: No  . Sexual activity: Not Currently   Other Topics Concern  . Not on file   Social History Narrative  . No narrative on file     ROS:  General: Negative for  weight loss, fever, chills. Positive fatigue, weakness. Eyes: Negative for vision changes.  ENT: Negative for hoarseness,nasal congestion. CV: Negative for chest pain, angina, palpitations,  peripheral edema. Positive dyspnea on exertion. Respiratory: Positive for dyspnea on exertion, dry cough, sputum, wheezing. No dyspnea at rest since transfusion.  GI: See history of present illness. GU:  Negative for dysuria, hematuria, urinary incontinence, urinary frequency, nocturnal urination.  MS: Negative for joint pain, low back pain.  Derm: Negative for rash or itching.  Neuro: Negative for weakness, abnormal sensation, seizure, frequent headaches, memory loss, confusion.  Psych: Negative for anxiety, depression, suicidal ideation, hallucinations.  Endo: Negative for unusual weight change.  Heme: Negative for  bruising or bleeding. Allergy: Negative for rash or hives.       Physical Examination: Vital signs in last 24 hours: Temp:  [97.4 F (36.3 C)-98.2 F (36.8 C)] 97.5 F (36.4 C) (01/04 0452) Pulse Rate:  [45-62] 62 (01/04 0452) Resp:  [18-24] 20 (01/04 0452) BP: (100-136)/(44-65) 120/55 (01/04 0452) SpO2:  [97 %-100 %] 99 % (01/04 0452) Weight:  [131 lb 8 oz (59.6 kg)-136 lb (61.7 kg)] 131 lb 8 oz (59.6 kg) (01/03 1044) Last BM Date: 03/28/16  General: Thin, elderly Caucasian female in no acute distress.  Head: Normocephalic, atraumatic.   Eyes: Conjunctiva pale, no icterus. Mouth: Oropharyngeal mucosa moist and pink , no lesions erythema or exudate. Neck: Supple without thyromegaly, masses, or lymphadenopathy.  Lungs: Clear to auscultation bilaterally with scattered wheeze.  Heart: Regular rate and rhythm, no murmurs rubs or gallops.  Abdomen: Bowel sounds are normal, nontender, nondistended, no hepatosplenomegaly or masses, no abdominal bruits or    hernia , no rebound or guarding.   Rectal: Performed in the ED, heme-negative Extremities: No lower extremity edema, clubbing, deformity.  Neuro: Alert and oriented x 4 , grossly normal neurologically.  Skin: Warm and dry, no rash or jaundice.   Psych: Alert and cooperative, normal mood and affect.        Intake/Output from previous day: 01/03 0701 - 01/04 0700 In: 1960 [P.O.:240; I.V.:1000; Blood:720] Out: -  Intake/Output this shift: No intake/output data recorded.  Lab Results: CBC  Recent Labs  04/07/16 0840 04/07/16 1948 04/08/16 0458  WBC 14.7* 11.2* 11.3*  HGB 6.5* 10.0* 9.6*  HCT 20.4* 29.6* 29.6*  MCV 94.0 90.5 90.0  PLT 297 245 264   BMET  Recent Labs  04/06/16 1542 04/07/16 0840 04/08/16 0458  NA 137 137 138  K 5.1 4.2 3.9  CL 109 109 113*  CO2 15* 21* 19*  GLUCOSE 150* 99 85  BUN 46* 39* 39*  CREATININE 1.61* 1.64* 1.48*  CALCIUM 8.6 8.6* 8.3*   LFT  Recent Labs  04/07/16 0840  BILITOT  0.3  ALKPHOS 53  AST 35  ALT 26  PROT 6.1*  ALBUMIN 2.9*    Lipase No results for input(s): LIPASE in the last 72 hours.  PT/INR No results for input(s): LABPROT, INR in the last 72 hours.    Imaging Studies: Dg Chest 2 View  Result Date: 04/03/2016 CLINICAL DATA:  SOB today, hx of COPD, pt stated she had cardiac stent placement Mar 16, 2016 EXAM: CHEST  2 VIEW COMPARISON:  03/12/2016 FINDINGS: Cardiac silhouette is normal in size. No mediastinal or hilar masses. No evidence of adenopathy. Lungs are hyperexpanded. Mild reticular scarring at the lung apices and anterior lung bases. Lungs otherwise clear. No pleural effusion.  No pneumothorax. Skeletal structures are demineralized but grossly intact. IMPRESSION: 1. No acute cardiopulmonary disease. 2. COPD. Electronically Signed   By: Lajean Manes M.D.   On: 04/03/2016 14:18   Dg Chest Portable 1 View  Result Date: 03/12/2016 CLINICAL DATA:  Upper back pain.  Acute coronary syndrome EXAM: PORTABLE CHEST 1 VIEW COMPARISON:  12/03/2015 FINDINGS: There is hyperinflation of the lungs compatible with COPD. Heart is borderline in size. No confluent opacities, effusions or acute bony abnormality. IMPRESSION: COPD.  No active disease. Electronically Signed   By: Rolm Baptise M.D.   On: 03/12/2016 08:52  [4 week]   Impression: 76 year old female with history of atrial flutter on chronic Eliquis, recent non-STEMI status post angioplasty  with DES on 03/15/2016 (aspirin and Brilinta added), COPD with recent addition of antibiotics and prednisone for potential exacerbation/infection who presented to the emergency department for symptomatic anemia in the setting of aspirin,Eliquis, Brilinta. No evidence of overt GI bleeding. She has had one significant nosebleed 1 week ago. She was Hemoccult negative on digital rectal exam this admission. Notably patient had a normal hemoglobin on discharge on 03/16/2016. When she presented to emergency department on  December 30 with shortness of breath her hemoglobin was 8.4. On January 3 it was 6.5. She has received 2 units of packed red blood cells this admission with appropriate response.  Patient has had no signs or symptoms of bleeding outside of single episode of epistaxis. No abdominal pain making intra-abdominal bleeding unlikely. Haptoglobin is elevated, possibly related acute phase reactant, LDH is normal.  Complains of chronic solid food dysphagia.      Plan: 1. Consider further GI evaluation ie colonoscopy and upper endoscopy+/-esophageal dilation if deemed safe and appropriate, Last dose of Eliquis and Brilinta 04/06/16. Patient currently on Plavix given recent DES. To discuss timing with Dr. Gala Romney.  2. Transition to clear liquid diet.   We would like to thank you for the opportunity to participate in the care of Patricia Jackson. Bernarda Caffey [facility] [phone] 1/4/201810:39 AM     LOS: 0 days

## 2016-04-08 NOTE — Progress Notes (Signed)
AVS reviewed with patient.  Verbalized understanding of discharge instructions, physician follow-up.  Prescriptions given to patient.  Patient's IV removed.  Site WNL.  Patient transported by NT via wheelchair to main entrance for discharge.  Patient stable at time of discharge.

## 2016-04-08 NOTE — Care Management Note (Signed)
Case Management Note  Patient Details  Name: Patricia Jackson MRN: [id number] Date of Birth: Jan 16, 1941  Subjective/Objective:                  Pt admitted with anemia. She is from home and ind with ADL's. She has PCP, transportation and insurance with drug coverage. She manages her own medications. She plans to return home with self care.   Action/Plan: No CM needs anticipated.   Expected Discharge Date:  04/08/16               Expected Discharge Plan:  Home/Self Care  In-House Referral:  NA  Discharge planning Services  CM Consult  Post Acute Care Choice:  NA Choice offered to:  NA  Status of Service:  Completed, signed off  Sherald Barge, RN 04/08/2016, 11:40 AM

## 2016-04-08 NOTE — Discharge Summary (Signed)
Physician Discharge Summary  Patricia Jackson KGU:[id number] DOB: 1940-08-23 DOA: 04/07/2016  PCP: Wende Neighbors, MD  Admit date: 04/07/2016 Discharge date: 04/08/2016  Admitted From:Home Disposition: Home   Recommendations for Outpatient Follow-up:  1. Follow up with PCP in 1-2 weeks 2. Please obtain BMP/CBC in one week 3. Please follow up with the gastroenterologist and cardiologist as an outpatient.  Home Health: No Equipment/Devices: No Discharge Condition: Stable CODE STATUS: Full code Diet recommendation: Heart healthy  Brief/Interim Summary:75 y.o. female with medical history significant of hypertension, dyslipidemia, COPD not on home oxygen, NSTEMI status post cardiac catheterization and stent placement on December 11 at [facility], on a Eliquis,  aspirin and Brilinta, went to the cardiologist office for hospital follow up when blood tests were done. Patient was called because of low hemoglobin level of 6.5. Patient was dilated to come to the hospital for evaluation. Patient also with worsening dyspnea on exertion, generalized weakness, lightheadedness and fatigue.  Iron deficiency anemia: Exact etiology unknown. Patient was on aspirin, Brilinta, Eliquis before admission. Patient with recent stent placement. Fecal occult blood test in the ER was negative for heme. Patient was found to have deficiency and iron. Transfused 2 units of red blood cell for asymptomatic anemia. Evaluated by gastroenterologist and cardiologist. Cardiologist recommended to discontinue aspirin, Brilinta and Eliquis and discharge with the Plavix. I discussed with the gastroenterologist, Dr.Rourk, no immediate plan for any endoscopy evaluation because of recent anticoagulation. Patient does not have active bleeding this time. He reported that his clinic will call the patient with outpatient follow-up. May consider endoscopic evaluation as an outpatient. I started oral iron. Recommended to follow-up CBC as an  outpatient follow-up with the gastroenterologist and Gi.  Patient with generalized weakness and sinus bradycardia on admission. She is already on amiodarone and metoprolol. I reduced the metoprolol to half because of symptoms and borderline bradycardia. Recommended to follow-up with her cardiologist outpatient.  Today, patient reported feeling much better after blood transfusion. Denied headache, dizziness, nausea, vomiting, chest pain or shortness of breath. Able to ambulate with physical therapy. Patient will be discharged home in a stable condition. Patient has mild elevation in TSH level likely due to amiodarone. Recommended to follow-up TSH as an outpatient.  Discharge Diagnoses:  Active Problems:   Anemia   Sinus bradycardia   Hx of heart artery stent   Esophageal dysphagia    Discharge Instructions  Discharge Instructions    Call MD for:  difficulty breathing, headache or visual disturbances    Complete by:  As directed    Call MD for:  extreme fatigue    Complete by:  As directed    Call MD for:  hives    Complete by:  As directed    Call MD for:  persistant dizziness or light-headedness    Complete by:  As directed    Call MD for:  persistant nausea and vomiting    Complete by:  As directed    Call MD for:  temperature >100.4    Complete by:  As directed    Diet - low sodium heart healthy    Complete by:  As directed    Discharge instructions    Complete by:  As directed    Please take Plavix every day. Follow-up with her cardiologist and gastroenterologist as an outpatient. Please check TSH level in 4-6 weeks with your PCP to monitor thyroid hormone level.   Increase activity slowly    Complete by:  As directed  Allergies as of 04/08/2016      Reactions   Sulfa Antibiotics Hives      Medication List    STOP taking these medications   amoxicillin 500 MG capsule Commonly known as:  AMOXIL   apixaban 5 MG Tabs tablet Commonly known as:  ELIQUIS   aspirin  81 MG chewable tablet   azithromycin 250 MG tablet Commonly known as:  ZITHROMAX   predniSONE 20 MG tablet Commonly known as:  DELTASONE     TAKE these medications   acetaminophen 325 MG tablet Commonly known as:  TYLENOL Take 2 tablets (650 mg total) by mouth every 4 (four) hours as needed for headache or mild pain.   albuterol (2.5 MG/3ML) 0.083% nebulizer solution Commonly known as:  PROVENTIL Take 3 mLs (2.5 mg total) by nebulization every 6 (six) hours as needed for wheezing or shortness of breath.   amiodarone 200 MG tablet Commonly known as:  PACERONE Take 1 tablet (200 mg total) by mouth daily.   amLODipine 5 MG tablet Commonly known as:  NORVASC Take 1 tablet (5 mg total) by mouth daily.   buPROPion 300 MG 24 hr tablet Commonly known as:  WELLBUTRIN XL Take 1 tablet by mouth daily.   clopidogrel 75 MG tablet Commonly known as:  PLAVIX Take 1 tablet (75 mg total) by mouth daily.   ferrous sulfate 325 (65 FE) MG tablet Commonly known as:  FERROUSUL Take 1 tablet (325 mg total) by mouth daily with breakfast.   HYDROcodone-acetaminophen 5-325 MG tablet Commonly known as:  NORCO/VICODIN Take 1 tablet by mouth every 6 (six) hours as needed for moderate pain.   levocetirizine 5 MG tablet Commonly known as:  XYZAL Take 5 mg by mouth every evening.   meclizine 25 MG tablet Commonly known as:  ANTIVERT TAKE ONE TABLET BY MOUTH THREE TIMES DAILY AS NEEDED FOR DIZZINESS   metoprolol succinate 50 MG 24 hr tablet Commonly known as:  TOPROL XL Take 1 tablet (50 mg total) by mouth daily. Take with or immediately following a meal. What changed:  medication strength  how much to take   nitroGLYCERIN 0.4 MG SL tablet Commonly known as:  NITROSTAT Place 1 tablet (0.4 mg total) under the tongue once.   ondansetron 4 MG disintegrating tablet Commonly known as:  ZOFRAN ODT 62m ODT q4 hours prn nausea/vomit   simvastatin 20 MG tablet Commonly known as:   ZOCOR Take 20 mg by mouth daily at 6 PM.   SYMBICORT 160-4.5 MCG/ACT inhaler Generic drug:  budesonide-formoterol Inhale 1 puff into the lungs daily.      Follow-up Information    ZWende Neighbors MD. Schedule an appointment as soon as possible for a visit in 1 week(s).   Specialty:  Internal Medicine Contact information: [phone]       BCarlyle Dolly MD. Schedule an appointment as soon as possible for a visit in 2 week(s).   Specialty:  Cardiology Contact information: [phone]        RManus Rudd MD. Schedule an appointment as soon as possible for a visit in 1 week(s).   Specialty:  Gastroenterology Why:  the office may call for appointment. Contact information: [phone]         Allergies  Allergen Reactions  . Sulfa Antibiotics Hives    Consultations: Cardiologist and gastroenterologist  Procedures/Studies: None  Subjective: Patient was  seen and examined at bedside. Reported feeling much better after blood transfusion. Denied fever, chills, headache, dizziness, nausea, vomiting, chest pain, shortness of breath. No abdominal pain. Able to ambulate with physical therapist.   Discharge Exam: Vitals:   04/08/16 1053 04/08/16 1400  BP: (!) 114/55 (!) 115/52  Pulse: 60 (!) 50  Resp:    Temp:  97.4 F (36.3 C)   Vitals:   04/08/16 0854 04/08/16 1049 04/08/16 1053 04/08/16 1400  BP:   (!) 114/55 (!) 115/52  Pulse:  (!) 58 60 (!) 50  Resp:      Temp:    97.4 F (36.3 C)  TempSrc:    Oral  SpO2: 98%   99%  Weight:      Height:        General: Pt is alert, awake, not in acute distress Cardiovascular: RRR, S1/S2 +, no rubs, no gallops Respiratory: CTA bilaterally, no wheezing, no rhonchi Abdominal: Soft, NT, ND, bowel sounds + Extremities: no edema, no cyanosis Neurology: Alert, awake, oriented, nonfocal neurological  exam.   The results of significant diagnostics from this hospitalization (including imaging, microbiology, ancillary and laboratory) are listed below for reference.     Microbiology: No results found for this or any previous visit (from the past 240 hour(s)).   Labs: BNP (last 3 results)  Recent Labs  11/01/15 2330 04/03/16 1351  BNP 375.4* 751.0*   Basic Metabolic Panel:  Recent Labs Lab 04/03/16 1351 04/06/16 1542 04/07/16 0840 04/08/16 0458  NA 139 137 137 138  K 4.2 5.1 4.2 3.9  CL 113* 109 109 113*  CO2 19* 15* 21* 19*  GLUCOSE 109* 150* 99 85  BUN 43* 46* 39* 39*  CREATININE 1.55* 1.61* 1.64* 1.48*  CALCIUM 8.8* 8.6 8.6* 8.3*   Liver Function Tests:  Recent Labs Lab 04/03/16 1351 04/07/16 0840  AST 35 35  ALT 25 26  ALKPHOS 61 53  BILITOT 0.3 0.3  PROT 6.6 6.1*  ALBUMIN 3.3* 2.9*   No results for input(s): LIPASE, AMYLASE in the last 168 hours. No results for input(s): AMMONIA in the last 168 hours. CBC:  Recent Labs Lab 04/03/16 1351 04/06/16 1542 04/07/16 0840 04/07/16 1948 04/08/16 0458  WBC 13.2* 16.2* 14.7* 11.2* 11.3*  NEUTROABS 10.8* 14,418* 10.8*  --   --   HGB 8.4* 7.4* 6.5* 10.0* 9.6*  HCT 26.0* 23.2* 20.4* 29.6* 29.6*  MCV 92.9 91.3 94.0 90.5 90.0  PLT 291 384 297 245 264   Cardiac Enzymes: No results for input(s): CKTOTAL, CKMB, CKMBINDEX, TROPONINI in the last 168 hours. BNP: Invalid input(s): POCBNP CBG: No results for input(s): GLUCAP in the last 168 hours. D-Dimer No results for input(s): DDIMER in the last 72 hours. Hgb A1c No results for input(s): HGBA1C in the last 72 hours. Lipid Profile No results for input(s): CHOL, HDL, LDLCALC, TRIG, CHOLHDL, LDLDIRECT in the last 72 hours. Thyroid function studies  Recent Labs  04/07/16 1156  TSH 4.978*   Anemia work up  Recent Labs  04/07/16 0847 04/07/16 1156  VITAMINB12  --  375  FERRITIN  --  161  TIBC  --  274  IRON  --  21*  RETICCTPCT 5.5*  --     Urinalysis    Component Value Date/Time   COLORURINE YELLOW 04/07/2016 [place] 04/07/2016 1353   LABSPEC 1.017 04/07/2016 1353   PHURINE 5.0 04/07/2016 1353   GLUCOSEU NEGATIVE 04/07/2016 1353   [place] NEGATIVE 04/07/2016 1353  BILIRUBINUR NEGATIVE 04/07/2016 [place] 04/07/2016 1353   PROTEINUR NEGATIVE 04/07/2016 1353   NITRITE NEGATIVE 04/07/2016 1353   LEUKOCYTESUR SMALL (A) 04/07/2016 1353   Sepsis Labs Invalid input(s): PROCALCITONIN,  WBC,  LACTICIDVEN Microbiology No results found for this or any previous visit (from the past 240 hour(s)).   Time coordinating discharge: 28 minutes  SIGNED:   Rosita Fire, MD  [facility] 04/08/2016, 2:59 PM  If 7PM-7AM, please contact night-coverage [email or web address] Password [email or web address]

## 2016-04-08 NOTE — Evaluation (Signed)
Physical Therapy Evaluation Patient Details Name: Patricia Jackson MRN: [id number] DOB: Nov 11, 1940 Today's Date: 04/08/2016   History of Present Illness  HPI: Patricia Jackson is a 76 y.o. female with medical history significant of hypertension, dyslipidemia, COPD not on home oxygen, NSTEMI status post cardiac catheterization and stent placement on December 11 at [facility], on a Eliquis,  aspirin and Brilinta, went to the cardiologist office yesterday for hospital discharge follow-up appointment blood test were done. Today patient was advised to come to the hospital for low hemoglobin level of 6.5. Patient reported worsening shortness of breath especially dyspnea on exertion associated with generalized weakness, lightheadedness while walking. She feels weak and fatigued. Denied headache, fever, chills, nausea, vomiting, chest pain, abdominal pain. Denied black colored stool or hematuria. Patient however reports constipation. She quit smoking since stent placement. No alcohol abuse. Lives with her family  Clinical Impression  Pt is I in bed mobility and ambulation but becomes SOB easily.  Pt states that she does not want home health PT At this time.  Recommended pt to walk for at least 100 ft at least three times a day.  No further PT services needed. Recommend nursing to ambulate with pt at least daily     Follow Up Recommendations No PT follow up    Equipment Recommendations    none   Recommendations for Other Services   none    Precautions / Restrictions Precautions Precautions: None Restrictions Weight Bearing Restrictions: No      Mobility  Bed Mobility Overal bed mobility: Independent                Transfers Overall transfer level: Independent                  Ambulation/Gait Ambulation/Gait assistance: Independent Ambulation Distance (Feet): 100 Feet (x2)   Gait Pattern/deviations: WFL(Within Functional Limits)   Gait velocity interpretation:  Below normal speed for age/gender    Stairs            Wheelchair Mobility    Modified Rankin (Stroke Patients Only)       Balance                                             Pertinent Vitals/Pain Pain Assessment: 0-10 Pain Score: 5  Pain Location: Ache Pain Intervention(s): Limited activity within patient's tolerance    Home Living Family/patient expects to be discharged to:: Private residence Living Arrangements: Children Available Help at Discharge: Family Type of Home: House Home Access: Stairs to enter Entrance Stairs-Rails: Right Entrance Stairs-Number of Steps: 2 Home Layout: One level Home Equipment: None      Prior Function Level of Independence: Independent               Hand Dominance        Extremity/Trunk Assessment        Lower Extremity Assessment Lower Extremity Assessment: Overall WFL for tasks assessed       Communication   Communication: No difficulties  Cognition Arousal/Alertness: Awake/alert Behavior During Therapy: WFL for tasks assessed/performed Overall Cognitive Status: Within Functional Limits for tasks assessed                      General Comments      Exercises     Assessment/Plan    PT Assessment Patent does not need  any further PT services  PT Problem List            PT Treatment Interventions      PT Goals (Current goals can be found in the Care Plan section)  Acute Rehab PT Goals Patient Stated Goal: To go home PT Goal Formulation: With patient    Frequency     Barriers to discharge        Co-evaluation               End of Session Equipment Utilized During Treatment: Gait belt Activity Tolerance:  (increased SOB) Patient left: in bed      Functional Limitation: Mobility: Walking and moving around Mobility: Walking and Moving Around Current Status (A1287): At least 20 percent but less than 40 percent impaired, limited or restricted Mobility:  Walking and Moving Around Goal Status [phone]): At least 20 percent but less than 40 percent impaired, limited or restricted Mobility: Walking and Moving Around Discharge Status [phone]): At least 20 percent but less than 40 percent impaired, limited or restricted    Time: 0850-0911 PT Time Calculation (min) (ACUTE ONLY): 21 min   Charges:         PT G Codes:   PT G-Codes **NOT FOR INPATIENT CLASS** Functional Limitation: Mobility: Walking and moving around Mobility: Walking and Moving Around Current Status (S9628): At least 20 percent but less than 40 percent impaired, limited or restricted Mobility: Walking and Moving Around Goal Status [phone]): At least 20 percent but less than 40 percent impaired, limited or restricted Mobility: Walking and Moving Around Discharge Status [phone]): At least 20 percent but less than 40 percent impaired, limited or restricted    Rayetta Humphrey, PT CLT [phone] 04/08/2016, 9:11 AM

## 2016-04-08 NOTE — Care Management Obs Status (Signed)
[place] NOTIFICATION   Patient Details  Name: Emerlyn Mehlhoff MRN: [id number] Date of Birth: [birth year]   Medicare Observation Status Notification Given:  Yes    Sherald Barge, RN 04/08/2016, 11:39 AM

## 2016-04-09 ENCOUNTER — Encounter: Payer: Self-pay | Admitting: Internal Medicine

## 2016-04-09 DIAGNOSIS — R946 Abnormal results of thyroid function studies: Secondary | ICD-10-CM | POA: Diagnosis not present

## 2016-04-09 DIAGNOSIS — D500 Iron deficiency anemia secondary to blood loss (chronic): Secondary | ICD-10-CM | POA: Diagnosis not present

## 2016-04-09 DIAGNOSIS — D509 Iron deficiency anemia, unspecified: Secondary | ICD-10-CM | POA: Diagnosis not present

## 2016-04-09 LAB — TYPE AND SCREEN
ABO/RH(D): A POS
Antibody Screen: NEGATIVE
Unit division: 0
Unit division: 0

## 2016-04-09 NOTE — Telephone Encounter (Signed)
APPT MADE AND LETTER SENT  °

## 2016-04-20 ENCOUNTER — Encounter: Payer: Self-pay | Admitting: Adult Health

## 2016-04-20 ENCOUNTER — Ambulatory Visit (INDEPENDENT_AMBULATORY_CARE_PROVIDER_SITE_OTHER): Payer: Medicare Other | Admitting: Adult Health

## 2016-04-20 VITALS — BP 112/58 | HR 48 | Ht 66.0 in | Wt 135.0 lb

## 2016-04-20 DIAGNOSIS — R001 Bradycardia, unspecified: Secondary | ICD-10-CM

## 2016-04-20 DIAGNOSIS — Z79899 Other long term (current) drug therapy: Secondary | ICD-10-CM | POA: Diagnosis not present

## 2016-04-20 DIAGNOSIS — I2510 Atherosclerotic heart disease of native coronary artery without angina pectoris: Secondary | ICD-10-CM | POA: Diagnosis not present

## 2016-04-20 DIAGNOSIS — I10 Essential (primary) hypertension: Secondary | ICD-10-CM | POA: Diagnosis not present

## 2016-04-20 MED ORDER — METOPROLOL SUCCINATE ER 25 MG PO TB24: 25.0000 mg | ORAL_TABLET | Freq: Every day | ORAL | 3 refills | Status: DC

## 2016-04-20 NOTE — Progress Notes (Signed)
Name: Patricia Jackson    DOB: 1941/02/07  Age: 76 y.o.  MR#: [id number]       PCP:  Wende Neighbors, MD      Insurance: Payor: [employer] MEDICARE / Plan: [facility] MEDICARE / Product Type: *No Product type* /   CC:    Chief Complaint  Patient presents with  . Coronary Artery Disease    VS Vitals:   04/20/16 1317  BP: (!) 112/58  Pulse: (!) 48  SpO2: 98%  Weight: 135 lb (61.2 kg)  Height: 5\' 6"  (1.676 m)    Weights Current Weight  04/20/16 135 lb (61.2 kg)  04/07/16 131 lb 8 oz (59.6 kg)  04/06/16 132 lb (59.9 kg)    Blood Pressure  BP Readings from Last 3 Encounters:  04/20/16 (!) 112/58  04/08/16 (!) 115/52  04/06/16 124/64     Admit date:  (Not on file) Last encounter with RMR:  04/06/2016   Allergy Sulfa antibiotics  Current Outpatient Prescriptions  Medication Sig Dispense Refill  . acetaminophen (TYLENOL) 325 MG tablet Take 2 tablets (650 mg total) by mouth every 4 (four) hours as needed for headache or mild pain.    Marland Kitchen albuterol (PROVENTIL) (2.5 MG/3ML) 0.083% nebulizer solution Take 3 mLs (2.5 mg total) by nebulization every 6 (six) hours as needed for wheezing or shortness of breath. 75 mL 12  . amiodarone (PACERONE) 200 MG tablet Take 1 tablet (200 mg total) by mouth daily. 30 tablet 5  . amLODipine (NORVASC) 5 MG tablet Take 1 tablet (5 mg total) by mouth daily. 30 tablet 6  . buPROPion (WELLBUTRIN XL) 300 MG 24 hr tablet Take 1 tablet by mouth daily.    . clopidogrel (PLAVIX) 75 MG tablet Take 1 tablet (75 mg total) by mouth daily. 90 tablet 3  . ferrous sulfate (FERROUSUL) 325 (65 FE) MG tablet Take 1 tablet (325 mg total) by mouth daily with breakfast. 30 tablet 0  . HYDROcodone-acetaminophen (NORCO/VICODIN) 5-325 MG tablet Take 1 tablet by mouth every 6 (six) hours as needed for moderate pain. 10 tablet 0  . levocetirizine (XYZAL) 5 MG tablet Take 5 mg by mouth every evening.    . meclizine (ANTIVERT) 25 MG tablet TAKE ONE TABLET BY MOUTH THREE TIMES DAILY AS  NEEDED FOR DIZZINESS 30 tablet 0  . metoprolol succinate (TOPROL XL) 50 MG 24 hr tablet Take 1 tablet (50 mg total) by mouth daily. Take with or immediately following a meal. 30 tablet 0  . ondansetron (ZOFRAN ODT) 4 MG disintegrating tablet 4mg  ODT q4 hours prn nausea/vomit 12 tablet 0  . simvastatin (ZOCOR) 20 MG tablet Take 20 mg by mouth daily at 6 PM.     . SYMBICORT 160-4.5 MCG/ACT inhaler Inhale 1 puff into the lungs daily. 1 Inhaler 5  . nitroGLYCERIN (NITROSTAT) 0.4 MG SL tablet Place 1 tablet (0.4 mg total) under the tongue once. 25 tablet 4   No current facility-administered medications for this visit.     Discontinued Meds:   There are no discontinued medications.  Patient Active Problem List   Diagnosis Date Noted  . Esophageal dysphagia   . Anemia 04/07/2016  . Sinus bradycardia   . Hx of heart artery stent   . CAD in native artery 03/16/2016  . S/P angioplasty with stent 03/15/16 DES Resolute, [place] 03/16/2016  . Acute coronary syndrome ([place])   . NSTEMI (non-ST elevated myocardial infarction) ([place]) 03/12/2016  . Shortness of breath   . Acute respiratory failure  with hypoxia ([place])   . Hypophosphatemia   . 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[place]) 10/31/2015  . Acute exacerbation of chronic bronchitis ([place]) 10/31/2015  . COPD exacerbation ([place]) 10/31/2015  . Dyslipidemia 10/31/2015  . Elevated troponin 10/31/2015  . Atrial flutter with rapid ventricular response ([place]) 10/30/2015  . SOB (shortness of breath) 11/26/2013  . Back pain 11/26/2013  . Arm pain 11/26/2013  . Chest pain 11/26/2013  . Hypertension     LABS    Component Value Date/Time   NA 138 04/08/2016 0458   NA 137 04/07/2016 0840   NA 137 04/06/2016 1542   K 3.9 04/08/2016 0458   K 4.2 04/07/2016 0840   K 5.1 04/06/2016 1542   CL 113 (H) 04/08/2016 0458   CL 109 04/07/2016 0840   CL 109 04/06/2016 1542   CO2 19 (L) 04/08/2016 0458   CO2 21 (L) 04/07/2016 0840   CO2 15 (L) 04/06/2016 1542   GLUCOSE 85  04/08/2016 0458   GLUCOSE 99 04/07/2016 0840   GLUCOSE 150 (H) 04/06/2016 1542   BUN 39 (H) 04/08/2016 0458   BUN 39 (H) 04/07/2016 0840   BUN 46 (H) 04/06/2016 1542   CREATININE 1.48 (H) 04/08/2016 0458   CREATININE 1.64 (H) 04/07/2016 0840   CREATININE 1.61 (H) 04/06/2016 1542   CREATININE 1.55 (H) 04/03/2016 1351   CREATININE 1.56 (H) 12/12/2015 0833   CREATININE 1.41 (H) 11/28/2015 1246   CALCIUM 8.3 (L) 04/08/2016 0458   CALCIUM 8.6 (L) 04/07/2016 0840   CALCIUM 8.6 04/06/2016 1542   GFRNONAA 34 (L) 04/08/2016 0458   GFRNONAA 30 (L) 04/07/2016 0840   GFRNONAA 32 (L) 04/03/2016 1351   GFRAA 39 (L) 04/08/2016 0458   GFRAA 35 (L) 04/07/2016 0840   GFRAA 37 (L) 04/03/2016 1351   CMP     Component Value Date/Time   NA 138 04/08/2016 0458   K 3.9 04/08/2016 0458   CL 113 (H) 04/08/2016 0458   CO2 19 (L) 04/08/2016 0458   GLUCOSE 85 04/08/2016 0458   BUN 39 (H) 04/08/2016 0458   CREATININE 1.48 (H) 04/08/2016 0458   CREATININE 1.61 (H) 04/06/2016 1542   CALCIUM 8.3 (L) 04/08/2016 0458   PROT 6.1 (L) 04/07/2016 0840   ALBUMIN 2.9 (L) 04/07/2016 0840   AST 35 04/07/2016 0840   ALT 26 04/07/2016 0840   ALKPHOS 53 04/07/2016 0840   BILITOT 0.3 04/07/2016 0840   GFRNONAA 34 (L) 04/08/2016 0458   GFRAA 39 (L) 04/08/2016 0458       Component Value Date/Time   WBC 11.3 (H) 04/08/2016 0458   WBC 11.2 (H) 04/07/2016 1948   WBC 14.7 (H) 04/07/2016 0840   HGB 9.6 (L) 04/08/2016 0458   HGB 10.0 (L) 04/07/2016 1948   HGB 6.5 (LL) 04/07/2016 0840   HCT 29.6 (L) 04/08/2016 0458   HCT 29.6 (L) 04/07/2016 1948   HCT 20.4 (L) 04/07/2016 0840   MCV 90.0 04/08/2016 0458   MCV 90.5 04/07/2016 1948   MCV 94.0 04/07/2016 0840    Lipid Panel     Component Value Date/Time   CHOL 159 03/13/2016 0324   TRIG 63 03/13/2016 0324   HDL 70 03/13/2016 0324   CHOLHDL 2.3 03/13/2016 0324   VLDL 13 03/13/2016 0324   LDLCALC 76 03/13/2016 0324    ABG    Component Value Date/Time    TCO2 17 10/30/2015 1759     Lab Results  Component Value Date   TSH 4.978 (H) 04/07/2016  BNP (last 3 results)  Recent Labs  11/01/15 2330 04/03/16 1351  BNP 375.4* 177.0*    ProBNP (last 3 results) No results for input(s): PROBNP in the last 8760 hours.  Cardiac Panel (last 3 results) No results for input(s): CKTOTAL, CKMB, TROPONINI, RELINDX in the last 72 hours.  Iron/TIBC/Ferritin/ %Sat    Component Value Date/Time   IRON 21 (L) 04/07/2016 1156   TIBC 274 04/07/2016 1156   FERRITIN 161 04/07/2016 1156   IRONPCTSAT 8 (L) 04/07/2016 1156     EKG Orders placed or performed during the hospital encounter of 04/07/16  . ED EKG  . ED EKG  . EKG 12-Lead  . EKG 12-Lead  . EKG     Prior Assessment and Plan Problem List as of 04/20/2016 Reviewed: 04/07/2016 11:04 AM by Rosita Fire, MD     Cardiovascular and Mediastinum   Hypertension   Atrial flutter with rapid ventricular response (HCC)   NSTEMI (non-ST elevated myocardial infarction) ([place])   Acute coronary syndrome (HCC)   CAD in native artery   Sinus bradycardia     Respiratory   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HCC)   Acute exacerbation of chronic bronchitis (HCC)   COPD exacerbation (HCC)   Acute respiratory failure with hypoxia (HCC)     Digestive   Esophageal dysphagia     Other   SOB (shortness of breath)   Back pain   Arm pain   Chest pain   Dyslipidemia   Elevated troponin   Hypophosphatemia   Shortness of breath   S/P angioplasty with stent 03/15/16 DES Res[place], pLCX   Anemia   Hx of heart artery stent       Imaging: Dg Chest 2 View  Result Date: 04/03/2016 CLINICAL DATA:  SOB today, hx of COPD, pt stated she had cardiac stent placement Mar 16, 2016 EXAM: CHEST  2 VIEW COMPARISON:  03/12/2016 FINDINGS: Cardiac silhouette is normal in size. No mediastinal or hilar masses. No evidence of adenopathy. Lungs are hyperexpanded. Mild reticular scarring at the lung apices and anterior lung bases.  Lungs otherwise clear. No pleural effusion.  No pneumothorax. Skeletal structures are demineralized but grossly intact. IMPRESSION: 1. No acute cardiopulmonary disease. 2. COPD. Electronically Signed   By: Lajean Manes M.D.   On: 04/03/2016 14:18

## 2016-04-20 NOTE — Progress Notes (Signed)
Cardiology Office Note   Date:  04/20/2016   ID:  Patricia Jackson, DOB Apr 05, 1941, MRN [id number]  PCP:  Wende Neighbors, MD  Cardiologist: Cloria Spring, NP   Chief Complaint  Patient presents with  . Coronary Artery Disease      History of Present Illness: Patricia Jackson is a 76 y.o. female who presents for ongoing assessment and management of coronary artery disease, status post drug-eluting stent to the proximal circumflex and mid circumflex on 03/15/2016, atrial flutter, with history of also to include COPD and breast cancer.  She was admitted to the hospital in the setting of severe dyspnea, after being seen in the office for same, and found to be very anemic with a hemoglobin of 6.5. He saw the patient on consultation.  She was diagnosed with iron deficiency anemia, fecal occult blood was negative. She was given transfusion of 2 units red blood cells for symptomatic anemia. Aspirin, Brilinta, and ELIQUIS were discontinued, she was started on Plavix 75 mg daily. GI did see her with no immediate plans for endoscopy evaluation. She was found to be bradycardic during hospitalization and metoprolol was decreased in dose to 50 mg XL daily. She is here for follow-up appointment for ongoing evaluation.  She is without complaint today feeling much better after blood transfusion and iron supplements. She has had one episode of dizziness when she was bending over mopping. Otherwise she has been doing well and has improvement in her breathing status as well.  Past Medical History:  Diagnosis Date  . Atrial flutter ([place])    eliquis since 11/2015  . Breast cancer ([place])    remote  . CAD in native artery 03/16/2016  . COPD (chronic obstructive pulmonary disease) ([place])   . Hypertension   . S/P angioplasty with stent 03/15/16 DES [place], [place] 03/16/2016    Past Surgical History:  Procedure Laterality Date  . APPENDECTOMY    . BREAST SURGERY    . CARDIAC CATHETERIZATION N/A  03/15/2016   Procedure: Left Heart Cath and Coronary Angiography;  Surgeon: Belva Crome, MD;  Location: [facility];  Service: Cardiovascular;  Laterality: N/A;  . CARDIAC CATHETERIZATION N/A 03/15/2016   Procedure: Coronary Stent Intervention;  Surgeon: Belva Crome, MD;  Location: [facility];  Service: Cardiovascular;  Laterality: N/A;  . COLONOSCOPY     remote     Current Outpatient Prescriptions  Medication Sig Dispense Refill  . acetaminophen (TYLENOL) 325 MG tablet Take 2 tablets (650 mg total) by mouth every 4 (four) hours as needed for headache or mild pain.    Marland Kitchen albuterol (PROVENTIL) (2.5 MG/3ML) 0.083% nebulizer solution Take 3 mLs (2.5 mg total) by nebulization every 6 (six) hours as needed for wheezing or shortness of breath. 75 mL 12  . amiodarone (PACERONE) 200 MG tablet Take 1 tablet (200 mg total) by mouth daily. 30 tablet 5  . amLODipine (NORVASC) 5 MG tablet Take 1 tablet (5 mg total) by mouth daily. 30 tablet 6  . buPROPion (WELLBUTRIN XL) 300 MG 24 hr tablet Take 1 tablet by mouth daily.    . clopidogrel (PLAVIX) 75 MG tablet Take 1 tablet (75 mg total) by mouth daily. 90 tablet 3  . ferrous sulfate (FERROUSUL) 325 (65 FE) MG tablet Take 1 tablet (325 mg total) by mouth daily with breakfast. 30 tablet 0  . HYDROcodone-acetaminophen (NORCO/VICODIN) 5-325 MG tablet Take 1 tablet by mouth every 6 (six) hours as needed for moderate pain. 10  tablet 0  . levocetirizine (XYZAL) 5 MG tablet Take 5 mg by mouth every evening.    . meclizine (ANTIVERT) 25 MG tablet TAKE ONE TABLET BY MOUTH THREE TIMES DAILY AS NEEDED FOR DIZZINESS 30 tablet 0  . ondansetron (ZOFRAN ODT) 4 MG disintegrating tablet 35m ODT q4 hours prn nausea/vomit 12 tablet 0  . simvastatin (ZOCOR) 20 MG tablet Take 20 mg by mouth daily at 6 PM.     . SYMBICORT 160-4.5 MCG/ACT inhaler Inhale 1 puff into the lungs daily. 1 Inhaler 5  . nitroGLYCERIN (NITROSTAT) 0.4 MG SL tablet Place 1 tablet (0.4 mg  total) under the tongue once. 25 tablet 4   No current facility-administered medications for this visit.     Allergies:   Sulfa antibiotics    Social History:  The patient  reports that she quit smoking about 5 months ago. Her smoking use included Cigarettes. She started smoking about 50 years ago. She has a 50.00 pack-year smoking history. She has never used smokeless tobacco. She reports that she does not drink alcohol or use drugs.   Family History:  The patient's family history includes COPD in her father; Cancer in her brother and sister; Heart disease in her mother.    ROS: All other systems are reviewed and negative. Unless otherwise mentioned in H&P    PHYSICAL EXAM: VS:  BP (!) 112/58   Pulse (!) 48   Ht 5' 6"  (1.676 m)   Wt 135 lb (61.2 kg)   SpO2 98%   BMI 21.79 kg/m  , BMI Body mass index is 21.79 kg/m. GEN: Well nourished, well developed, in no acute distress  HEENT: normal  Neck: no JVD, carotid bruits, or masses Cardiac: RRR; bradycardic, no murmurs, rubs, or gallops,no edema  Respiratory:  Clear to auscultation bilaterally, normal work of breathing GI: soft, nontender, nondistended, + BS MS: no deformity or atrophy  Skin: warm and dry, no rash Neuro:  Strength and sensation are intact Psych: euthymic mood, full affect   Recent Labs: 11/02/2015: Magnesium 2.4 04/03/2016: B Natriuretic Peptide 177.0 04/07/2016: ALT 26; TSH 4.978 04/08/2016: BUN 39; Creatinine, Ser 1.48; Hemoglobin 9.6; Platelets 264; Potassium 3.9; Sodium 138    Lipid Panel    Component Value Date/Time   CHOL 159 03/13/2016 0324   TRIG 63 03/13/2016 0324   HDL 70 03/13/2016 0324   CHOLHDL 2.3 03/13/2016 0324   VLDL 13 03/13/2016 0324   LDLCALC 76 03/13/2016 0324      Wt Readings from Last 3 Encounters:  04/20/16 135 lb (61.2 kg)  04/07/16 131 lb 8 oz (59.6 kg)  04/06/16 132 lb (59.9 kg)     ASSESSMENT AND PLAN:  1.  Coronary artery disease: Since hospitalization with severe  iron deficiency anemia she has been taken off of ELIQUIS, and aspirin. She remains on Plavix 75 mg daily. I will check a CBC in 2 weeks. She is bradycardic today and mildly hypotensive. I will decrease her metoprolol from 50 mg XL daily to 25 mg XL daily. Elevated heart rate may have been related to anemia at the time.  2. Iron deficiency anemia: She is now on iron supplements. Ferrous sulfate 325 mg daily with breakfast. She will follow-up with her primary care physician Dr. HNevada Crane in the next couple of months. I will repeat her CBC in 2 weeks to evaluate for downtrend in the setting of ongoing Plavix administration.  3. Hypertension: Blood pressure is soft today. I will decrease her metoprolol to  25 mg twice a day in the setting of associated bradycardia. I will continue amlodipine 5 mg daily as directed. May need further titration depending upon her response.   Current medicines are reviewed at length with the patient today.    Labs/ tests ordered today include: CBC No orders of the defined types were placed in this encounter.    Disposition:   FU with  3 months.  Signed, Jory Sims, NP  04/20/2016 1:33 PM    [facility] [address], [place], [place] [address] Phone: [phone]; Fax: [phone]

## 2016-04-20 NOTE — Patient Instructions (Signed)
Your physician recommends that you schedule a follow-up appointment in: 2 Months   Your physician has recommended you make the following change in your medication:  Decrease Toprol XL to 25 mg Daily   Your physician recommends that you return for lab work in: ( CBC, BMET)   If you need a refill on your cardiac medications before your next appointment, please call your pharmacy.  Thank you for choosing [facility]!

## 2016-04-28 ENCOUNTER — Ambulatory Visit (INDEPENDENT_AMBULATORY_CARE_PROVIDER_SITE_OTHER): Payer: Medicare Other | Admitting: Gastroenterology

## 2016-04-28 ENCOUNTER — Encounter: Payer: Self-pay | Admitting: Gastroenterology

## 2016-04-28 VITALS — BP 114/67 | HR 55 | Temp 97.4°F | Ht 66.0 in | Wt 131.0 lb

## 2016-04-28 DIAGNOSIS — D649 Anemia, unspecified: Secondary | ICD-10-CM

## 2016-04-28 DIAGNOSIS — R1319 Other dysphagia: Secondary | ICD-10-CM

## 2016-04-28 DIAGNOSIS — R1310 Dysphagia, unspecified: Secondary | ICD-10-CM

## 2016-04-28 NOTE — Assessment & Plan Note (Addendum)
76 year old female with recent NSTEMI with stenting, presented to hospital due to SOB and profound anemia hgb 6.5. She was heme negative. Iron/fe sat low. Hgb had been normal 03/2016 prior to starting Brilinta/ASA, was already on Eliquis also. No recent colonoscopy. Cardiology desires adding back ASA. Currently on Plavix. Eliquis and Brilinta were stopped. To help guide with anticoagulation/antiplatelet therapy, would offer colonoscopy and upper endoscopy once cleared by cardiology.  I have discussed the risks, alternatives, benefits with regards to but not limited to the risk of reaction to medication, bleeding, infection, perforation and the patient is agreeable to proceed. Written consent to be obtained.  She will be due for updated cbc next week.

## 2016-04-28 NOTE — Patient Instructions (Signed)
1. I will let Patricia Jackson with cardiology know that you cannot have your labs done at [facility]. If you haven't heard from her office with recommendations in a couple of days, please call them at [phone]. 2. I will also check with cardiology prior to scheduling you for upper endoscopy and colonoscopy. Further recommendations to follow.

## 2016-04-28 NOTE — Progress Notes (Signed)
Primary Care Physician:  Wende Neighbors, MD  Primary Gastroenterologist:  Garfield Cornea, MD   Chief Complaint  Patient presents with  . Dysphagia    HPI:  Patricia Jackson is a 76 y.o. female here for hospital follow-up of anemia and dysphagia. She was seen back on 04/08/2016 when she presented to the hospital with severe shortness of breath and with profound anemia, hemoglobin 6.5. She was Hemoccult negative on digital rectal exam. Her haptoglobin was elevated at 338, her iron was 21 slightly low, iron saturation slightly low at 8%, TIBC 274, ferritin 161, B12 375. She received 2 units of packed red blood cells and her hemoglobin came back up in the 9 range.  Patient's history is pertinent for recent NSTEMI with cardiac catheterization on 03/15/2016 requiring stenting. Initially she was started on aspirin, Brilinta, in addition to her Eliquis for one month with plans to stop aspirin therapy after that.Patient was seen in the ED on December 30 with complaints of shortness of breath. She has a history of COPD as well. She was treated for respiratory infection and COPD exacerbation with amoxicillin, prednisone, Zofran, hydrocodone. She was seen in follow-up by her cardiologist on January 2. She complained of progressive fatigue, dyspnea on exertion, shortness of breath. Unable to ambulate more than 1 or 2 feet without getting short of breath. Her Brilinta was switched to Plavix for ?side effect of SOB, she had stat CBC and her hgb was 7.4. Down from 8.4 one week prior and 12.6 three weeks prior. Advised to go to the ED.  During her recent admission cardiology recommended holding aspirin and Eliquis and switching to Plavix. Brilinta stopped as outpatient. After GI evaluation complete, add back 54m ASA in place of Elqius.   Decision was made not to rush on EGD and colonoscopy given recent MI, no documented GI bleeding.  Patient tells me she still has difficulty feeling like food is sitting in her lower  chest after she swallows. Feels like food goes down but then she develops pressure in the epigastric region which is only relieved if she passes gas/belches. Denies heartburn. No nausea or vomiting. Denies abdominal pain. Chronically constipated. Takes milk of magnesia when necessary. She will like to have something she can take daily. Previously did not do well on MiraLAX.  She is supposed to have labs next week for cardiology [facility]. She tells me her insurance will not allow her to go there and she needs to make other arrangements.  Current Outpatient Prescriptions  Medication Sig Dispense Refill  . albuterol (PROVENTIL) (2.5 MG/3ML) 0.083% nebulizer solution Take 3 mLs (2.5 mg total) by nebulization every 6 (six) hours as needed for wheezing or shortness of breath. 75 mL 12  . amiodarone (PACERONE) 200 MG tablet Take 1 tablet (200 mg total) by mouth daily. 30 tablet 5  . amLODipine (NORVASC) 5 MG tablet Take 1 tablet (5 mg total) by mouth daily. 30 tablet 6  . buPROPion (WELLBUTRIN XL) 300 MG 24 hr tablet Take 1 tablet by mouth daily.    . clopidogrel (PLAVIX) 75 MG tablet Take 1 tablet (75 mg total) by mouth daily. 90 tablet 3  . ferrous sulfate (FERROUSUL) 325 (65 FE) MG tablet Take 1 tablet (325 mg total) by mouth daily with breakfast. 30 tablet 0  . levocetirizine (XYZAL) 5 MG tablet Take 5 mg by mouth every evening.    . metoprolol succinate (TOPROL-XL) 25 MG 24 hr tablet Take 1 tablet (25 mg total) by mouth daily. Take  with or immediately following a meal. 90 tablet 3  . simvastatin (ZOCOR) 20 MG tablet Take 20 mg by mouth daily at 6 PM.     . SYMBICORT 160-4.5 MCG/ACT inhaler Inhale 1 puff into the lungs daily. 1 Inhaler 5  . nitroGLYCERIN (NITROSTAT) 0.4 MG SL tablet Place 1 tablet (0.4 mg total) under the tongue once. 25 tablet 4   No current facility-administered medications for this visit.     Allergies as of 04/28/2016 - Review Complete 04/28/2016  Allergen Reaction Noted  .  Sulfa antibiotics Hives 08/02/2013    Past Medical History:  Diagnosis Date  . Atrial flutter ([place])    eliquis since 11/2015  . Breast cancer ([place])    remote  . CAD in native artery 03/16/2016  . COPD (chronic obstructive pulmonary disease) ([place])   . Hypertension   . S/P angioplasty with stent 03/15/16 DES [facility], [place] 03/16/2016    Past Surgical History:  Procedure Laterality Date  . APPENDECTOMY    . BREAST SURGERY    . CARDIAC CATHETERIZATION N/A 03/15/2016   Procedure: Left Heart Cath and Coronary Angiography;  Surgeon: Belva Crome, MD;  Location: [facility];  Service: Cardiovascular;  Laterality: N/A;  . CARDIAC CATHETERIZATION N/A 03/15/2016   Procedure: Coronary Stent Intervention;  Surgeon: Belva Crome, MD;  Location: [facility];  Service: Cardiovascular;  Laterality: N/A;  . COLONOSCOPY     remote    Family History  Problem Relation Age of Onset  . Heart disease Mother   . COPD Father   . Cancer Sister     unknown primary  . Cancer Brother     unknown primary  . Colon cancer Neg Hx     Social History   Social History  . Marital status: Divorced    Spouse name: N/A  . Number of children: N/A  . Years of education: N/A   Occupational History  . Not on file.   Social History Main Topics  . Smoking status: Former Smoker    Packs/day: 1.00    Years: 50.00    Types: Cigarettes    Start date: 12/15/1965    Quit date: 10/29/2015  . Smokeless tobacco: Never Used  . Alcohol use No  . Drug use: No  . Sexual activity: Not Currently   Other Topics Concern  . Not on file   Social History Narrative  . No narrative on file      ROS:  General: Negative for anorexia, weight loss, fever, chills, fatigue, weakness. Eyes: Negative for vision changes.  ENT: Negative for hoarseness,   nasal congestion. See hpi CV: Negative for chest pain, angina, palpitations, dyspnea on exertion, peripheral edema.  Respiratory: chronic DOE, Negative for  dyspnea at rest, cough, sputum, wheezing.  GI: See history of present illness. GU:  Negative for dysuria, hematuria, urinary incontinence, urinary frequency, nocturnal urination.  MS: Negative for joint pain, low back pain.  Derm: Negative for rash or itching.  Neuro: Negative for weakness, abnormal sensation, seizure, frequent headaches, memory loss, confusion.  Psych: Negative for anxiety, depression, suicidal ideation, hallucinations.  Endo: Negative for unusual weight change.  Heme: Negative for bruising or bleeding. Allergy: Negative for rash or hives.    Physical Examination:  BP 114/67   Pulse (!) 55   Temp 97.4 F (36.3 C) (Oral)   Ht 5' 6"  (1.676 m)   Wt 131 lb (59.4 kg)   BMI 21.14 kg/m    General:  Well-nourished, well-developed in no acute distress.  Head: Normocephalic, atraumatic.   Eyes: Conjunctiva pink, no icterus. Mouth: Oropharyngeal mucosa moist and pink , no lesions erythema or exudate. Neck: Supple without thyromegaly, masses, or lymphadenopathy.  Lungs: Clear to auscultation bilaterally but somewhat diminished Heart: Regular rate and rhythm, no murmurs rubs or gallops.  Abdomen: Bowel sounds are normal, nontender, nondistended, no hepatosplenomegaly or masses, no abdominal bruits or    hernia , no rebound or guarding.   Rectal: not performed Extremities: No lower extremity edema. No clubbing or deformities.  Neuro: Alert and oriented x 4 , grossly normal neurologically.  Skin: Warm and dry, no rash or jaundice.   Psych: Alert and cooperative, normal mood and affect.  Labs: Lab Results  Component Value Date   WBC 11.3 (H) 04/08/2016   HGB 9.6 (L) 04/08/2016   HCT 29.6 (L) 04/08/2016   MCV 90.0 04/08/2016   PLT 264 04/08/2016   Lab Results  Component Value Date   CREATININE 1.48 (H) 04/08/2016   BUN 39 (H) 04/08/2016   NA 138 04/08/2016   K 3.9 04/08/2016   CL 113 (H) 04/08/2016   CO2 19 (L) 04/08/2016   Lab Results  Component Value Date    ALT 26 04/07/2016   AST 35 04/07/2016   ALKPHOS 53 04/07/2016   BILITOT 0.3 04/07/2016   Lab Results  Component Value Date   IRON 21 (L) 04/07/2016   TIBC 274 04/07/2016   FERRITIN 161 04/07/2016   Lab Results  Component Value Date   VITAMINB12 375 04/07/2016    Imaging Studies: Dg Chest 2 View  Result Date: 04/03/2016 CLINICAL DATA:  SOB today, hx of COPD, pt stated she had cardiac stent placement Mar 16, 2016 EXAM: CHEST  2 VIEW COMPARISON:  03/12/2016 FINDINGS: Cardiac silhouette is normal in size. No mediastinal or hilar masses. No evidence of adenopathy. Lungs are hyperexpanded. Mild reticular scarring at the lung apices and anterior lung bases. Lungs otherwise clear. No pleural effusion.  No pneumothorax. Skeletal structures are demineralized but grossly intact. IMPRESSION: 1. No acute cardiopulmonary disease. 2. COPD. Electronically Signed   By: Lajean Manes M.D.   On: 04/03/2016 14:18

## 2016-04-29 NOTE — Assessment & Plan Note (Signed)
Chronic symptoms of fullness in the epigastric region/lower chest with meals. ?distal esophageal stricture vs hiatal hernia vs gastritis/PUD. Once cleared by cardiology, consider EGD +/- ED.  I have discussed the risks, alternatives, benefits with regards to but not limited to the risk of reaction to medication, bleeding, infection, perforation and the patient is agreeable to proceed. Written consent to be obtained.

## 2016-04-30 NOTE — Progress Notes (Signed)
cc'ed to pcp °

## 2016-05-02 NOTE — Progress Notes (Signed)
OK with cardiology (discussed with Jory Sims, NP) to pursue TCS/EGD/ED from cardiac standpoint. Consider holding betablocker day of procedure due to resting heart rate of 55 at time of my OV.   Please schedule for TCS/EGD/ED with [facility]. Dx: transfusion dependent anemia/IDA and epig pain/dysphagia Day of procedure: do not take metoprolol morning of procedure.  Hold iron 7 days before.   Also, please tell her to call cardiology about not being able to do her labs with [facility] (insurance will not allow her to use [facility]), I messaged Jory Sims, NP ordering provider about this but haven't heard back.

## 2016-05-03 ENCOUNTER — Other Ambulatory Visit: Payer: Self-pay

## 2016-05-03 DIAGNOSIS — D649 Anemia, unspecified: Secondary | ICD-10-CM

## 2016-05-03 DIAGNOSIS — R1310 Dysphagia, unspecified: Secondary | ICD-10-CM

## 2016-05-03 MED ORDER — PEG 3350-KCL-NA BICARB-NACL 420 G PO SOLR: 4000.0000 mL | ORAL | 0 refills | Status: DC

## 2016-05-03 NOTE — Progress Notes (Signed)
Pt is set up for TCS on 05/26/16 @ 8:45 am. Instructions are going out in the mail and her son is aware.

## 2016-05-10 ENCOUNTER — Telehealth: Payer: Self-pay

## 2016-05-10 NOTE — Telephone Encounter (Signed)
Patient called to say she had CP last Thursday feb 1 which was like the pain the sent her to the ED and involved having a stent placed.She took one NTG and after 5 minutes,pain went away. I told her to let us know if this occurs again and if she is needing to use NTG.She knows after 3 successive nitro's, she needs to go to the ED   I will [facility] provider

## 2016-05-10 NOTE — Telephone Encounter (Signed)
Agree with advise

## 2016-05-14 ENCOUNTER — Other Ambulatory Visit: Payer: Self-pay | Admitting: Physician Assistant

## 2016-05-14 MED ORDER — AMIODARONE HCL 200 MG PO TABS: 200.0000 mg | ORAL_TABLET | Freq: Every day | ORAL | 3 refills | Status: DC

## 2016-05-14 NOTE — Telephone Encounter (Signed)
Rx sent electronically.  

## 2016-05-14 NOTE — Telephone Encounter (Signed)
°*  STAT* If patient is at the pharmacy, call can be transferred to refill team.   1. Which medications need to be refilled? (please list name of each medication and dose if known) Amiodarone  2. Which pharmacy/location (including street and city if local pharmacy) is medication to be sent to?[place] [phone]  3. Do they need a 30 day or 90 day supply? 30 and refills

## 2016-05-18 ENCOUNTER — Other Ambulatory Visit: Payer: Self-pay

## 2016-05-18 MED ORDER — AMIODARONE HCL 200 MG PO TABS: 200.0000 mg | ORAL_TABLET | Freq: Every day | ORAL | 11 refills | Status: DC

## 2016-05-18 NOTE — Telephone Encounter (Signed)
Amiodarone refilled per fax request

## 2016-05-26 ENCOUNTER — Encounter (HOSPITAL_COMMUNITY)
Admission: RE | Disposition: A | Discharge status: Home / Self Care | Payer: Self-pay | Source: Ambulatory Visit | Attending: Internal Medicine

## 2016-05-26 ENCOUNTER — Ambulatory Visit (HOSPITAL_COMMUNITY)
Admission: RE | Admit: 2016-05-26 | Discharge: 2016-05-26 | Disposition: A | Discharge status: Home / Self Care | Payer: Medicare Other | Source: Ambulatory Visit | Attending: Internal Medicine | Admitting: Internal Medicine

## 2016-05-26 ENCOUNTER — Encounter (HOSPITAL_COMMUNITY): Payer: Self-pay | Admitting: *Deleted

## 2016-05-26 DIAGNOSIS — Z9889 Other specified postprocedural states: Secondary | ICD-10-CM | POA: Diagnosis not present

## 2016-05-26 DIAGNOSIS — K5730 Diverticulosis of large intestine without perforation or abscess without bleeding: Secondary | ICD-10-CM

## 2016-05-26 DIAGNOSIS — D649 Anemia, unspecified: Secondary | ICD-10-CM

## 2016-05-26 DIAGNOSIS — Z825 Family history of asthma and other chronic lower respiratory diseases: Secondary | ICD-10-CM | POA: Insufficient documentation

## 2016-05-26 DIAGNOSIS — Z8249 Family history of ischemic heart disease and other diseases of the circulatory system: Secondary | ICD-10-CM | POA: Diagnosis not present

## 2016-05-26 DIAGNOSIS — Z79899 Other long term (current) drug therapy: Secondary | ICD-10-CM | POA: Diagnosis not present

## 2016-05-26 DIAGNOSIS — I2510 Atherosclerotic heart disease of native coronary artery without angina pectoris: Secondary | ICD-10-CM | POA: Insufficient documentation

## 2016-05-26 DIAGNOSIS — D123 Benign neoplasm of transverse colon: Secondary | ICD-10-CM | POA: Diagnosis not present

## 2016-05-26 DIAGNOSIS — K209 Esophagitis, unspecified: Secondary | ICD-10-CM

## 2016-05-26 DIAGNOSIS — Z7902 Long term (current) use of antithrombotics/antiplatelets: Secondary | ICD-10-CM | POA: Insufficient documentation

## 2016-05-26 DIAGNOSIS — I252 Old myocardial infarction: Secondary | ICD-10-CM | POA: Diagnosis not present

## 2016-05-26 DIAGNOSIS — Z955 Presence of coronary angioplasty implant and graft: Secondary | ICD-10-CM | POA: Insufficient documentation

## 2016-05-26 DIAGNOSIS — K449 Diaphragmatic hernia without obstruction or gangrene: Secondary | ICD-10-CM | POA: Diagnosis not present

## 2016-05-26 DIAGNOSIS — J449 Chronic obstructive pulmonary disease, unspecified: Secondary | ICD-10-CM | POA: Diagnosis not present

## 2016-05-26 DIAGNOSIS — K210 Gastro-esophageal reflux disease with esophagitis: Secondary | ICD-10-CM | POA: Insufficient documentation

## 2016-05-26 DIAGNOSIS — I10 Essential (primary) hypertension: Secondary | ICD-10-CM | POA: Insufficient documentation

## 2016-05-26 DIAGNOSIS — D509 Iron deficiency anemia, unspecified: Secondary | ICD-10-CM | POA: Diagnosis not present

## 2016-05-26 DIAGNOSIS — I4892 Unspecified atrial flutter: Secondary | ICD-10-CM | POA: Insufficient documentation

## 2016-05-26 DIAGNOSIS — Z853 Personal history of malignant neoplasm of breast: Secondary | ICD-10-CM | POA: Diagnosis not present

## 2016-05-26 DIAGNOSIS — Z9049 Acquired absence of other specified parts of digestive tract: Secondary | ICD-10-CM | POA: Diagnosis not present

## 2016-05-26 DIAGNOSIS — R1310 Dysphagia, unspecified: Secondary | ICD-10-CM | POA: Diagnosis not present

## 2016-05-26 DIAGNOSIS — Z87891 Personal history of nicotine dependence: Secondary | ICD-10-CM | POA: Diagnosis not present

## 2016-05-26 HISTORY — PX: COLONOSCOPY: SHX5424

## 2016-05-26 HISTORY — PX: ESOPHAGOGASTRODUODENOSCOPY: SHX5428

## 2016-05-26 HISTORY — PX: MALONEY DILATION: SHX5535

## 2016-05-26 SURGERY — COLONOSCOPY
Anesthesia: Moderate Sedation

## 2016-05-26 MED ORDER — SODIUM CHLORIDE 0.9 % IV SOLN
INTRAVENOUS | Status: DC
  Administered 2016-05-26: 1000 mL via INTRAVENOUS

## 2016-05-26 MED ORDER — ONDANSETRON HCL 4 MG/2ML IJ SOLN
INTRAMUSCULAR | Status: AC
  Filled 2016-05-26: qty 2

## 2016-05-26 MED ORDER — MEPERIDINE HCL 100 MG/ML IJ SOLN
INTRAMUSCULAR | Status: DC | PRN
  Administered 2016-05-26: 25 mg via INTRAVENOUS
  Administered 2016-05-26: 12.5 mg via INTRAVENOUS
  Administered 2016-05-26: 25 mg via INTRAVENOUS

## 2016-05-26 MED ORDER — ONDANSETRON HCL 4 MG/2ML IJ SOLN
INTRAMUSCULAR | Status: DC | PRN
  Administered 2016-05-26: 4 mg via INTRAVENOUS

## 2016-05-26 MED ORDER — LIDOCAINE VISCOUS 2 % MT SOLN
OROMUCOSAL | Status: DC | PRN
  Administered 2016-05-26: 1 via OROMUCOSAL

## 2016-05-26 MED ORDER — MIDAZOLAM HCL 5 MG/5ML IJ SOLN
INTRAMUSCULAR | Status: DC | PRN
  Administered 2016-05-26 (×4): 1 mg via INTRAVENOUS
  Administered 2016-05-26: 2 mg via INTRAVENOUS

## 2016-05-26 MED ORDER — MIDAZOLAM HCL 5 MG/5ML IJ SOLN
INTRAMUSCULAR | Status: AC
  Filled 2016-05-26: qty 10

## 2016-05-26 MED ORDER — LIDOCAINE VISCOUS 2 % MT SOLN
OROMUCOSAL | Status: AC
  Filled 2016-05-26: qty 15

## 2016-05-26 MED ORDER — MEPERIDINE HCL 100 MG/ML IJ SOLN
INTRAMUSCULAR | Status: AC
  Filled 2016-05-26: qty 2

## 2016-05-26 NOTE — Op Note (Signed)
[facility] Patient Name: Patricia Jackson Procedure Date: 05/26/2016 9:28 AM MRN: [id number] Date of Birth: Aug 06, 1940 Attending MD: Norvel Richards , MD CSN: [id number] Age: 76 Admit Type: Outpatient Procedure:                Colonoscopy with the snare polypectomy Indications:              Iron deficiency anemia Providers:                Norvel Richards, MD, Janeece Riggers, RN, Isabella Stalling, Technician Referring MD:              Medicines:                Midazolam 6 mg IV, Meperidine 62.5 mg IV,                            Ondansetron 4 mg IV Complications:            No immediate complications. Estimated Blood Loss:     Estimated blood loss was minimal. Procedure:                Pre-Anesthesia Assessment:                           - Prior to the procedure, a History and Physical                            was performed, and patient medications and                            allergies were reviewed. The patient's tolerance of                            previous anesthesia was also reviewed. The risks                            and benefits of the procedure and the sedation                            options and risks were discussed with the patient.                            All questions were answered, and informed consent                            was obtained. Prior Anticoagulants: The patient has                            taken no previous anticoagulant or antiplatelet                            agents. ASA Grade Assessment: III - A patient with  severe systemic disease. After reviewing the risks                            and benefits, the patient was deemed in                            satisfactory condition to undergo the procedure.                           After obtaining informed consent, the colonoscope                            was passed under direct vision. Throughout the   procedure, the patient's blood pressure, pulse, and                            oxygen saturations were monitored continuously. The                            EC-[id number] ([id number]) scope was introduced through                            the anus and advanced to the the cecum, identified                            by appendiceal orifice and ileocecal valve. The                            colonoscopy was performed without difficulty. The                            patient tolerated the procedure well. The quality                            of the bowel preparation was adequate. The                            ileocecal valve, appendiceal orifice, and rectum                            were photographed. The colonoscopy was performed                            without difficulty. The patient tolerated the                            procedure well. Scope In: 9:32:24 AM Scope Out: 9:50:02 AM Scope Withdrawal Time: 0 hours 9 minutes 11 seconds  Total Procedure Duration: 0 hours 17 minutes 38 seconds  Findings:      The perianal and digital rectal examinations were normal.      Multiple small and large-mouthed diverticula were found in the sigmoid       colon and descending colon.      Two sessile polyps were found in the hepatic flexure. The polyps were 5  to 6 mm in size. These polyps were removed with a cold snare. Resection       and retrieval were complete. Estimated blood loss was minimal.      The exam was otherwise without abnormality on direct and retroflexion       views. Impression:               - Diverticulosis in the sigmoid colon and in the                            descending colon.                           - Two 5 to 6 mm polyps at the hepatic flexure,                            removed with a cold snare. Resected and retrieved.                           - The examination was otherwise normal on direct                            and retroflexion views. Moderate Sedation:       Moderate (conscious) sedation was administered by the endoscopy nurse       and supervised by the endoscopist. The following parameters were       monitored: oxygen saturation, heart rate, blood pressure, respiratory       rate, EKG, adequacy of pulmonary ventilation, and response to care.       Total physician intraservice time was 35 minutes. Recommendation:           - Patient has a contact number available for                            emergencies. The signs and symptoms of potential                            delayed complications were discussed with the                            patient. Return to normal activities tomorrow.                            Written discharge instructions were provided to the                            patient.                           - Resume previous diet.                           - Continue present medications.                           - Repeat colonoscopy date to be determined after  pending pathology results are reviewed for                            surveillance based on pathology results.                           - Return to GI office in 3 months. See EGD report. Procedure Code(s):        --- Professional ---                           [phone], Colonoscopy, flexible; with removal of                            tumor(s), polyp(s), or other lesion(s) by snare                            technique                           99152, Moderate sedation services provided by the                            same physician or other qualified health care                            professional performing the diagnostic or                            therapeutic service that the sedation supports,                            requiring the presence of an independent trained                            observer to assist in the monitoring of the                            patient's level of consciousness and physiological                             status; initial 15 minutes of intraservice time,                            patient age 81 years or older                           [phone], Moderate sedation services; each additional                            15 minutes intraservice time Diagnosis Code(s):        --- Professional ---                           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 All rights reserved. The codes documented in this report are preliminary and upon coder review may  be revised to meet current compliance requirements. Cristopher Estimable. Kyrstal Monterrosa, MD Norvel Richards, MD 05/26/2016 10:20:18 AM This report has been signed electronically. Number of Addenda: 0

## 2016-05-26 NOTE — Discharge Instructions (Addendum)
°Colonoscopy °Discharge Instructions ° °Read the instructions outlined below and refer to this sheet in the next few weeks. These discharge instructions provide you with general information on caring for yourself after you leave the hospital. Your doctor may also give you specific instructions. While your treatment has been planned according to the most current medical practices available, unavoidable complications occasionally occur. If you have any problems or questions after discharge, call Dr. Delmi Fulfer at [phone]. °ACTIVITY °· You may resume your regular activity, but move at a slower pace for the next 24 hours.  °· Take frequent rest periods for the next 24 hours.  °· Walking will help get rid of the air and reduce the bloated feeling in your belly (abdomen).  °· No driving for 24 hours (because of the medicine (anesthesia) used during the test).   °· Do not sign any important legal documents or operate any machinery for 24 hours (because of the anesthesia used during the test).  °NUTRITION °· Drink plenty of fluids.  °· You may resume your normal diet as instructed by your doctor.  °· Begin with a light meal and progress to your normal diet. Heavy or fried foods are harder to digest and may make you feel sick to your stomach (nauseated).  °· Avoid alcoholic beverages for 24 hours or as instructed.  °MEDICATIONS °· You may resume your normal medications unless your doctor tells you otherwise.  °WHAT YOU CAN EXPECT TODAY °· Some feelings of bloating in the abdomen.  °· Passage of more gas than usual.  °· Spotting of blood in your stool or on the toilet paper.  °IF YOU HAD POLYPS REMOVED DURING THE COLONOSCOPY: °· No aspirin products for 7 days or as instructed.  °· No alcohol for 7 days or as instructed.  °· Eat a soft diet for the next 24 hours.  °FINDING OUT THE RESULTS OF YOUR TEST °Not all test results are available during your visit. If your test results are not back during the visit, make an appointment  with your caregiver to find out the results. Do not assume everything is normal if you have not heard from your caregiver or the medical facility. It is important for you to follow up on all of your test results.  °SEEK IMMEDIATE MEDICAL ATTENTION IF: °· You have more than a spotting of blood in your stool.  °· Your belly is swollen (abdominal distention).  °· You are nauseated or vomiting.  °· You have a temperature over 101.  °· You have abdominal pain or discomfort that is severe or gets worse throughout the day.  °EGD °Discharge instructions °Please read the instructions outlined below and refer to this sheet in the next few weeks. These discharge instructions provide you with general information on caring for yourself after you leave the hospital. Your doctor may also give you specific instructions. While your treatment has been planned according to the most current medical practices available, unavoidable complications occasionally occur. If you have any problems or questions after discharge, please call your doctor. °ACTIVITY °· You may resume your regular activity but move at a slower pace for the next 24 hours.  °· Take frequent rest periods for the next 24 hours.  °· Walking will help expel (get rid of) the air and reduce the bloated feeling in your abdomen.  °· No driving for 24 hours (because of the anesthesia (medicine) used during the test).  °· You may shower.  °· Do not sign any important   legal documents or operate any machinery for 24 hours (because of the anesthesia used during the test).  NUTRITION  Drink plenty of fluids.   You may resume your normal diet.   Begin with a light meal and progress to your normal diet.   Avoid alcoholic beverages for 24 hours or as instructed by your caregiver.  MEDICATIONS  You may resume your normal medications unless your caregiver tells you otherwise.  WHAT YOU CAN EXPECT TODAY  You may experience abdominal discomfort such as a feeling of fullness  or gas pains.  FOLLOW-UP  Your doctor will discuss the results of your test with you.  SEEK IMMEDIATE MEDICAL ATTENTION IF ANY OF THE FOLLOWING OCCUR:  Excessive nausea (feeling sick to your stomach) and/or vomiting.   Severe abdominal pain and distention (swelling).   Trouble swallowing.   Temperature over 101 F (37.8 C).   Rectal bleeding or vomiting of blood.    GERD, colon polyp and diverticulosis information provided  Begin Protonix 20 mg daily  Office visit with [place] in 3 months  Keep upcoming office visit with cardiologist  Further recommendations to follow pending review of pathology report  Resume iron today      Gastroesophageal Reflux Disease, Adult Introduction Normally, food travels down the esophagus and stays in the stomach to be digested. If a person has gastroesophageal reflux disease (GERD), food and stomach acid move back up into the esophagus. When this happens, the esophagus becomes sore and swollen (inflamed). Over time, GERD can make small holes (ulcers) in the lining of the esophagus. Follow these instructions at home: Diet  Follow a diet as told by your doctor. You may need to avoid foods and drinks such as:  Coffee and tea (with or without caffeine).  Drinks that contain alcohol.  Energy drinks and sports drinks.  Carbonated drinks or sodas.  Chocolate and cocoa.  Peppermint and mint flavorings.  Garlic and onions.  Horseradish.  Spicy and acidic foods, such as peppers, chili powder, curry powder, vinegar, hot sauces, and BBQ sauce.  Citrus fruit juices and citrus fruits, such as oranges, lemons, and limes.  Tomato-based foods, such as red sauce, chili, salsa, and pizza with red sauce.  Fried and fatty foods, such as donuts, french fries, potato chips, and high-fat dressings.  High-fat meats, such as hot dogs, rib eye steak, sausage, ham, and bacon.  High-fat dairy items, such as whole milk, butter, and cream  cheese.  Eat small meals often. Avoid eating large meals.  Avoid drinking large amounts of liquid with your meals.  Avoid eating meals during the 2-3 hours before bedtime.  Avoid lying down right after you eat.  Do not exercise right after you eat. General instructions  Pay attention to any changes in your symptoms.  Take over-the-counter and prescription medicines only as told by your doctor. Do not take aspirin, ibuprofen, or other NSAIDs unless your doctor says it is okay.  Do not use any tobacco products, including cigarettes, chewing tobacco, and e-cigarettes. If you need help quitting, ask your doctor.  Wear loose clothes. Do not wear anything tight around your waist.  Raise (elevate) the head of your bed about 6 inches (15 cm).  Try to lower your stress. If you need help doing this, ask your doctor.  If you are overweight, lose an amount of weight that is healthy for you. Ask your doctor about a safe weight loss goal.  Keep all follow-up visits as told by your doctor. This  is important. Contact a doctor if:  You have new symptoms.  You lose weight and you do not know why it is happening.  You have trouble swallowing, or it hurts to swallow.  You have wheezing or a cough that keeps happening.  Your symptoms do not get better with treatment.  You have a hoarse voice. Get help right away if:  You have pain in your arms, neck, jaw, teeth, or back.  You feel sweaty, dizzy, or light-headed.  You have chest pain or shortness of breath.  You throw up (vomit) and your throw up looks like blood or coffee grounds.  You pass out (faint).  Your poop (stool) is bloody or black.  You cannot swallow, drink, or eat. This information is not intended to replace advice given to you by your health care provider. Make sure you discuss any questions you have with your health care provider. Document Released: 09/08/2007 Document Revised: 08/28/2015 Document Reviewed:  07/17/2014  2017 Elsevier    Colon Polyps Introduction Polyps are tissue growths inside the body. Polyps can grow in many places, including the large intestine (colon). A polyp may be a round bump or a mushroom-shaped growth. You could have one polyp or several. Most colon polyps are noncancerous (benign). However, some colon polyps can become cancerous over time. What are the causes? The exact cause of colon polyps is not known. What increases the risk? This condition is more likely to develop in people who:  Have a family history of colon cancer or colon polyps.  Are older than 56 or older than 45 if they are African American.  Have inflammatory bowel disease, such as ulcerative colitis or Crohn disease.  Are overweight.  Smoke cigarettes.  Do not get enough exercise.  Drink too much alcohol.  Eat a diet that is:  High in fat and red meat.  Low in fiber.  Had childhood cancer that was treated with abdominal radiation. What are the signs or symptoms? Most polyps do not cause symptoms. If you have symptoms, they may include:  Blood coming from your rectum when having a bowel movement.  Blood in your stool.The stool may look dark red or black.  A change in bowel habits, such as constipation or diarrhea. How is this diagnosed? This condition is diagnosed with a colonoscopy. This is a procedure that uses a lighted, flexible scope to look at the inside of your colon. How is this treated? Treatment for this condition involves removing any polyps that are found. Those polyps will then be tested for cancer. If cancer is found, your health care provider will talk to you about options for colon cancer treatment. Follow these instructions at home: Diet  Eat plenty of fiber, such as fruits, vegetables, and whole grains.  Eat foods that are high in calcium and vitamin D, such as milk, cheese, yogurt, eggs, liver, fish, and broccoli.  Limit foods high in fat, red meats, and  processed meats, such as hot dogs, sausage, bacon, and lunch meats.  Maintain a healthy weight, or lose weight if recommended by your health care provider. General instructions  Do not smoke cigarettes.  Do not drink alcohol excessively.  Keep all follow-up visits as told by your health care provider. This is important. This includes keeping regularly scheduled colonoscopies. Talk to your health care provider about when you need a colonoscopy.  Exercise every day or as told by your health care provider. Contact a health care provider if:  You have new  or worsening bleeding during a bowel movement.  You have new or increased blood in your stool.  You have a change in bowel habits.  You unexpectedly lose weight. This information is not intended to replace advice given to you by your health care provider. Make sure you discuss any questions you have with your health care provider. Document Released: 12/17/2003 Document Revised: 08/28/2015 Document Reviewed: 02/10/2015  2017 Elsevier    Diverticulosis Diverticulosis is the condition that develops when small pouches (diverticula) form in the wall of your colon. Your colon, or large intestine, is where water is absorbed and stool is formed. The pouches form when the inside layer of your colon pushes through weak spots in the outer layers of your colon. CAUSES  No one knows exactly what causes diverticulosis. RISK FACTORS  Being older than 51. Your risk for this condition increases with age. Diverticulosis is rare in people younger than 40 years. By age 27, almost everyone has it.  Eating a low-fiber diet.  Being frequently constipated.  Being overweight.  Not getting enough exercise.  Smoking.  Taking over-the-counter pain medicines, like aspirin and ibuprofen. SYMPTOMS  Most people with diverticulosis do not have symptoms. DIAGNOSIS  Because diverticulosis often has no symptoms, health care providers often discover the  condition during an exam for other colon problems. In many cases, a health care provider will diagnose diverticulosis while using a flexible scope to examine the colon (colonoscopy). TREATMENT  If you have never developed an infection related to diverticulosis, you may not need treatment. If you have had an infection before, treatment may include:  Eating more fruits, vegetables, and grains.  Taking a fiber supplement.  Taking a live bacteria supplement (probiotic).  Taking medicine to relax your colon. HOME CARE INSTRUCTIONS   Drink at least 6-8 glasses of water each day to prevent constipation.  Try not to strain when you have a bowel movement.  Keep all follow-up appointments. If you have had an infection before:  Increase the fiber in your diet as directed by your health care provider or dietitian.  Take a dietary fiber supplement if your health care provider approves.  Only take medicines as directed by your health care provider. SEEK MEDICAL CARE IF:   You have abdominal pain.  You have bloating.  You have cramps.  You have not gone to the bathroom in 3 days. SEEK IMMEDIATE MEDICAL CARE IF:   Your pain gets worse.  Yourbloating becomes very bad.  You have a fever or chills, and your symptoms suddenly get worse.  You begin vomiting.  You have bowel movements that are bloody or black. MAKE SURE YOU:  Understand these instructions.  Will watch your condition.  Will get help right away if you are not doing well or get worse. This information is not intended to replace advice given to you by your health care provider. Make sure you discuss any questions you have with your health care provider. Document Released: 12/18/2003 Document Revised: 03/27/2013 Document Reviewed: 02/14/2013 Elsevier Interactive Patient Education  2017 [employer].

## 2016-05-26 NOTE — Op Note (Signed)
[facility] Patient Name: Patricia Jackson Procedure Date: 05/26/2016 9:08 AM MRN: [id number] Date of Birth: 04/29/1940 Attending MD: Norvel Richards , MD CSN: [id number] Age: 76 Admit Type: Outpatient Procedure:                Upper GI endoscopy with Venia Minks dilation Indications:              Dysphagia Providers:                Norvel Richards, MD, Janeece Riggers, RN, Isabella Stalling, Technician Referring MD:              Medicines:                Midazolam 4 mg IV, Meperidine 50 mg IV, Ondansetron                            4 mg IV Complications:            No immediate complications. Estimated Blood Loss:     Estimated blood loss: none. Estimated blood loss:                            none. Procedure:                Pre-Anesthesia Assessment:                           - Prior to the procedure, a History and Physical                            was performed, and patient medications and                            allergies were reviewed. The patient's tolerance of                            previous anesthesia was also reviewed. The risks                            and benefits of the procedure and the sedation                            options and risks were discussed with the patient.                            All questions were answered, and informed consent                            was obtained. Prior Anticoagulants: The patient has                            taken no previous anticoagulant or antiplatelet  agents. ASA Grade Assessment: III - A patient with                            severe systemic disease. After reviewing the risks                            and benefits, the patient was deemed in                            satisfactory condition to undergo the procedure.                           After obtaining informed consent, the endoscope was                            passed under direct vision.  Throughout the                            procedure, the patient's blood pressure, pulse, and                            oxygen saturations were monitored continuously. The                            [id number] ([id number]) scope was introduced through the                            mouth, and advanced to the second part of duodenum.                            The upper GI endoscopy was accomplished without                            difficulty. The patient tolerated the procedure                            well. Scope In: 9:19:16 AM Scope Out: 9:28:10 AM Total Procedure Duration: 0 hours 8 minutes 54 seconds  Findings:      LA Grade A (one or more mucosal breaks less than 5 mm, not extending       between tops of 2 mucosal folds) esophagitis was found 35 to 36 cm from       the incisors. no Barrett's epithelium seen.No tumor. The scope was       withdrawn. Dilation was performed with a Maloney dilator with no       resistance at 22 Fr. The dilation site was examined following endoscope       reinsertion and showed no change. Estimated blood loss: none.      A small hiatal hernia was present. tomach other      The exam was otherwise without abnormality.      The duodenal bulb and second portion of the duodenum were normal. Impression:               - LA Grade A esophagitis. status post Venia Minks  dilation                           - Small hiatal hernia.                           - The examination was otherwise normal.                           - No specimens collected. Moderate Sedation:      Moderate (conscious) sedation was administered by the endoscopy nurse       and supervised by the endoscopist. The following parameters were       monitored: oxygen saturation, heart rate, blood pressure, respiratory       rate, EKG, adequacy of pulmonary ventilation, and response to care.       Total physician intraservice time was 15 minutes. Recommendation:           -  Patient has a contact number available for                            emergencies. The signs and symptoms of potential                            delayed complications were discussed with the                            patient. Return to normal activities tomorrow.                            Written discharge instructions were provided to the                            patient.                           - Resume previous diet.                           - Continue present medications. Begin Protonix 20                            mg daiy. Resume iron today.                           - No repeat upper endoscopy.                           - Return to GI office in 3 months. Procedure Code(s):        --- Professional ---                           [phone], Esophagogastroduodenoscopy, flexible,                            transoral; diagnostic, including collection of  specimen(s) by brushing or washing, when performed                            (separate procedure)                           99152, Moderate sedation services provided by the                            same physician or other qualified health care                            professional performing the diagnostic or                            therapeutic service that the sedation supports,                            requiring the presence of an independent trained                            observer to assist in the monitoring of the                            patient's level of consciousness and physiological                            status; initial 15 minutes of intraservice time,                            patient age 20 years or older Diagnosis Code(s):        --- Professional ---                           K20.9, Esophagitis, unspecified                           K44.9, Diaphragmatic hernia without obstruction or                            gangrene                           R13.10, Dysphagia,  unspecified CPT copyright 2016 American Medical Association. All rights reserved. The codes documented in this report are preliminary and upon coder review may  be revised to meet current compliance requirements. Cristopher Estimable. Ahmira Boisselle, MD Norvel Richards, MD 05/26/2016 10:08:09 AM This report has been signed electronically. Number of Addenda: 0

## 2016-05-26 NOTE — Interval H&P Note (Signed)
History and Physical Interval Note:  05/26/2016 9:07 AM  Patricia Jackson  has presented today for surgery, with the diagnosis of TRANSFUSION DEPENDENT ANEMIA/IDA/DYSPHAGIA  The various methods of treatment have been discussed with the patient and family. After consideration of risks, benefits and other options for treatment, the patient has consented to  Procedure(s) with comments: COLONOSCOPY (N/A) - 845 ESOPHAGOGASTRODUODENOSCOPY (EGD) (N/A) MALONEY DILATION (N/A) as a surgical intervention .  The patient's history has been reviewed, patient examined, no change in status, stable for surgery.  I have reviewed the patient's chart and labs.  Questions were answered to the patient's satisfaction.     Robert Rourk  No Brilinta or ASA;  No aspirin. Continues on Plavix per plan. No beta blocker this morning. Heart rate 66.  Plan EGD/ED plus colonoscopy per plan.  The risks, benefits, limitations, imponderables and alternatives regarding both EGD and colonoscopy have been reviewed with the patient. Questions have been answered. All parties agreeable.   Icreased bleeding risks with Plavix reviewed with patient.

## 2016-05-26 NOTE — H&P (View-Only) (Signed)
Primary Care Physician:  Zack Hall, MD  Primary Gastroenterologist:  Michael Rourk, MD   Chief Complaint  Patient presents with  . Dysphagia    HPI:  Patricia Jackson is a 76 y.o. female here for hospital follow-up of anemia and dysphagia. She was seen back on 04/08/2016 when she presented to the hospital with severe shortness of breath and with profound anemia, hemoglobin 6.5. She was Hemoccult negative on digital rectal exam. Her haptoglobin was elevated at 338, her iron was 21 slightly low, iron saturation slightly low at 8%, TIBC 274, ferritin 161, B12 375. She received 2 units of packed red blood cells and her hemoglobin came back up in the 9 range.  Patient's history is pertinent for recent NSTEMI with cardiac catheterization on 03/15/2016 requiring stenting. Initially she was started on aspirin, Brilinta, in addition to her Eliquis for one month with plans to stop aspirin therapy after that.Patient was seen in the ED on December 30 with complaints of shortness of breath. She has a history of COPD as well. She was treated for respiratory infection and COPD exacerbation with amoxicillin, prednisone, Zofran, hydrocodone. She was seen in follow-up by her cardiologist on January 2. She complained of progressive fatigue, dyspnea on exertion, shortness of breath. Unable to ambulate more than 1 or 2 feet without getting short of breath. Her Brilinta was switched to Plavix for ?side effect of SOB, she had stat CBC and her hgb was 7.4. Down from 8.4 one week prior and 12.6 three weeks prior. Advised to go to the ED.  During her recent admission cardiology recommended holding aspirin and Eliquis and switching to Plavix. Brilinta stopped as outpatient. After GI evaluation complete, add back 81mg ASA in place of Elqius.   Decision was made not to rush on EGD and colonoscopy given recent MI, no documented GI bleeding.  Patient tells me she still has difficulty feeling like food is sitting in her lower  chest after she swallows. Feels like food goes down but then she develops pressure in the epigastric region which is only relieved if she passes gas/belches. Denies heartburn. No nausea or vomiting. Denies abdominal pain. Chronically constipated. Takes milk of magnesia when necessary. She will like to have something she can take daily. Previously did not do well on MiraLAX.  She is supposed to have labs next week for cardiology [facility]. She tells me her insurance will not allow her to go there and she needs to make other arrangements.  Current Outpatient Prescriptions  Medication Sig Dispense Refill  . albuterol (PROVENTIL) (2.5 MG/3ML) 0.083% nebulizer solution Take 3 mLs (2.5 mg total) by nebulization every 6 (six) hours as needed for wheezing or shortness of breath. 75 mL 12  . amiodarone (PACERONE) 200 MG tablet Take 1 tablet (200 mg total) by mouth daily. 30 tablet 5  . amLODipine (NORVASC) 5 MG tablet Take 1 tablet (5 mg total) by mouth daily. 30 tablet 6  . buPROPion (WELLBUTRIN XL) 300 MG 24 hr tablet Take 1 tablet by mouth daily.    . clopidogrel (PLAVIX) 75 MG tablet Take 1 tablet (75 mg total) by mouth daily. 90 tablet 3  . ferrous sulfate (FERROUSUL) 325 (65 FE) MG tablet Take 1 tablet (325 mg total) by mouth daily with breakfast. 30 tablet 0  . levocetirizine (XYZAL) 5 MG tablet Take 5 mg by mouth every evening.    . metoprolol succinate (TOPROL-XL) 25 MG 24 hr tablet Take 1 tablet (25 mg total) by mouth daily. Take   with or immediately following a meal. 90 tablet 3  . simvastatin (ZOCOR) 20 MG tablet Take 20 mg by mouth daily at 6 PM.     . SYMBICORT 160-4.5 MCG/ACT inhaler Inhale 1 puff into the lungs daily. 1 Inhaler 5  . nitroGLYCERIN (NITROSTAT) 0.4 MG SL tablet Place 1 tablet (0.4 mg total) under the tongue once. 25 tablet 4   No current facility-administered medications for this visit.     Allergies as of 04/28/2016 - Review Complete 04/28/2016  Allergen Reaction Noted  .  Sulfa antibiotics Hives 08/02/2013    Past Medical History:  Diagnosis Date  . Atrial flutter (HCC)    eliquis since 11/2015  . Breast cancer (HCC)    remote  . CAD in native artery 03/16/2016  . COPD (chronic obstructive pulmonary disease) (HCC)   . Hypertension   . S/P angioplasty with stent 03/15/16 DES Resolute, pLCX 03/16/2016    Past Surgical History:  Procedure Laterality Date  . APPENDECTOMY    . BREAST SURGERY    . CARDIAC CATHETERIZATION N/A 03/15/2016   Procedure: Left Heart Cath and Coronary Angiography;  Surgeon: Henry W Smith, MD;  Location: [facility] INV[facility]IVE [facility];  Service: Cardiovascular;  Laterality: N/A;  . CARDIAC CATHETERIZATION N/A 03/15/2016   Procedure: Coronary Stent Intervention;  Surgeon: Henry W Smith, MD;  Location: [facility] INVASIVE CV LAB;  Service: Cardiovascular;  Laterality: N/A;  . COLONOSCOPY     remote    Family History  Problem Relation Age of Onset  . Heart disease Mother   . COPD Father   . Cancer Sister     unknown primary  . Cancer Brother     unknown primary  . Colon cancer Neg Hx     Social History   Social History  . Marital status: Divorced    Spouse name: N/A  . Number of children: N/A  . Years of education: N/A   Occupational History  . Not on file.   Social History Main Topics  . Smoking status: Former Smoker    Packs/day: 1.00    Years: 50.00    Types: Cigarettes    Start date: 12/15/1965    Quit date: 10/29/2015  . Smokeless tobacco: Never Used  . Alcohol use No  . Drug use: No  . Sexual activity: Not Currently   Other Topics Concern  . Not on file   Social History Narrative  . No narrative on file      ROS:  General: Negative for anorexia, weight loss, fever, chills, fatigue, weakness. Eyes: Negative for vision changes.  ENT: Negative for hoarseness,   nasal congestion. See hpi CV: Negative for chest pain, angina, palpitations, dyspnea on exertion, peripheral edema.  Respiratory: chronic DOE, Negative for  dyspnea at rest, cough, sputum, wheezing.  GI: See history of present illness. GU:  Negative for dysuria, hematuria, urinary incontinence, urinary frequency, nocturnal urination.  MS: Negative for joint pain, low back pain.  Derm: Negative for rash or itching.  Neuro: Negative for weakness, abnormal sensation, seizure, frequent headaches, memory loss, confusion.  Psych: Negative for anxiety, depression, suicidal ideation, hallucinations.  Endo: Negative for unusual weight change.  Heme: Negative for bruising or bleeding. Allergy: Negative for rash or hives.    Physical Examination:  BP 114/67   Pulse (!) 55   Temp 97.4 F (36.3 C) (Oral)   Ht 5' 6" (1.676 m)   Wt 131 lb (59.4 kg)   BMI 21.14 kg/m    General:   Well-nourished, well-developed in no acute distress.  Head: Normocephalic, atraumatic.   Eyes: Conjunctiva pink, no icterus. Mouth: Oropharyngeal mucosa moist and pink , no lesions erythema or exudate. Neck: Supple without thyromegaly, masses, or lymphadenopathy.  Lungs: Clear to auscultation bilaterally but somewhat diminished Heart: Regular rate and rhythm, no murmurs rubs or gallops.  Abdomen: Bowel sounds are normal, nontender, nondistended, no hepatosplenomegaly or masses, no abdominal bruits or    hernia , no rebound or guarding.   Rectal: not performed Extremities: No lower extremity edema. No clubbing or deformities.  Neuro: Alert and oriented x 4 , grossly normal neurologically.  Skin: Warm and dry, no rash or jaundice.   Psych: Alert and cooperative, normal mood and affect.  Labs: Lab Results  Component Value Date   WBC 11.3 (H) 04/08/2016   HGB 9.6 (L) 04/08/2016   HCT 29.6 (L) 04/08/2016   MCV 90.0 04/08/2016   PLT 264 04/08/2016   Lab Results  Component Value Date   CREATININE 1.48 (H) 04/08/2016   BUN 39 (H) 04/08/2016   NA 138 04/08/2016   K 3.9 04/08/2016   CL 113 (H) 04/08/2016   CO2 19 (L) 04/08/2016   Lab Results  Component Value Date    ALT 26 04/07/2016   AST 35 04/07/2016   ALKPHOS 53 04/07/2016   BILITOT 0.3 04/07/2016   Lab Results  Component Value Date   IRON 21 (L) 04/07/2016   TIBC 274 04/07/2016   FERRITIN 161 04/07/2016   Lab Results  Component Value Date   VITAMINB12 375 04/07/2016    Imaging Studies: Dg Chest 2 View  Result Date: 04/03/2016 CLINICAL DATA:  SOB today, hx of COPD, pt stated she had cardiac stent placement Mar 16, 2016 EXAM: CHEST  2 VIEW COMPARISON:  03/12/2016 FINDINGS: Cardiac silhouette is normal in size. No mediastinal or hilar masses. No evidence of adenopathy. Lungs are hyperexpanded. Mild reticular scarring at the lung apices and anterior lung bases. Lungs otherwise clear. No pleural effusion.  No pneumothorax. Skeletal structures are demineralized but grossly intact. IMPRESSION: 1. No acute cardiopulmonary disease. 2. COPD. Electronically Signed   By: David  Ormond M.D.   On: 04/03/2016 14:18     

## 2016-05-27 ENCOUNTER — Encounter: Payer: Self-pay | Admitting: Internal Medicine

## 2016-05-28 ENCOUNTER — Encounter (HOSPITAL_COMMUNITY): Payer: Self-pay | Admitting: Internal Medicine

## 2016-05-31 ENCOUNTER — Other Ambulatory Visit: Payer: Self-pay | Admitting: Pulmonary Disease

## 2016-05-31 ENCOUNTER — Encounter: Payer: Self-pay | Admitting: Pulmonary Disease

## 2016-05-31 ENCOUNTER — Ambulatory Visit (INDEPENDENT_AMBULATORY_CARE_PROVIDER_SITE_OTHER): Payer: Medicare Other | Admitting: Pulmonary Disease

## 2016-05-31 VITALS — BP 116/78 | HR 64 | Ht 67.0 in | Wt 133.0 lb

## 2016-05-31 DIAGNOSIS — R0602 Shortness of breath: Secondary | ICD-10-CM

## 2016-05-31 DIAGNOSIS — R0600 Dyspnea, unspecified: Secondary | ICD-10-CM

## 2016-05-31 DIAGNOSIS — J8410 Pulmonary fibrosis, unspecified: Secondary | ICD-10-CM

## 2016-05-31 LAB — PULMONARY FUNCTION TEST
DL/VA % pred: 53 %
DL/VA: 2.74 ml/min/mmHg/L
DLCO cor % pred: 35 %
DLCO cor: 9.99 ml/min/mmHg
DLCO unc % pred: 33 %
DLCO unc: 9.39 ml/min/mmHg
FEF 25-75 Post: 1.31 L/sec
FEF 25-75 Pre: 0.68 L/sec
FEF2575-%Change-Post: 93 %
FEF2575-%Pred-Post: 68 %
FEF2575-%Pred-Pre: 35 %
FEV1-%Change-Post: 19 %
FEV1-%Pred-Post: 64 %
FEV1-%Pred-Pre: 54 %
FEV1-Post: 1.6 L
FEV1-Pre: 1.33 L
FEV1FVC-%Change-Post: 1 %
FEV1FVC-%Pred-Pre: 86 %
FEV6-%Change-Post: 17 %
FEV6-%Pred-Post: 76 %
FEV6-%Pred-Pre: 65 %
FEV6-Post: 2.39 L
FEV6-Pre: 2.03 L
FEV6FVC-%Change-Post: 0 %
FEV6FVC-%Pred-Post: 102 %
FEV6FVC-%Pred-Pre: 103 %
FVC-%Change-Post: 17 %
FVC-%Pred-Post: 74 %
FVC-%Pred-Pre: 63 %
FVC-Post: 2.42 L
FVC-Pre: 2.05 L
Post FEV1/FVC ratio: 66 %
Post FEV6/FVC ratio: 99 %
Pre FEV1/FVC ratio: 65 %
Pre FEV6/FVC Ratio: 99 %
RV % pred: 101 %
RV: 2.44 L
TLC % pred: 84 %
TLC: 4.63 L

## 2016-05-31 NOTE — Progress Notes (Signed)
Patricia Jackson    [id number]    09-08-1940  Primary Care Physician:Zack Nevada Crane, MD  Referring Physician: Celene Squibb, MD [address] [place], [place] [address]  Chief complaint:  Follow-up for moderate COPD  HPI: Patricia Jackson is a 75 year old with past medical history of atrial fibrillation, COPD. She was hospitalized and August of this year with atypical chest pain, COPD exacerbation. She was treated with prednisone taper. She was also noted to be in atrial flutter. She was initially treated with a Cardizem drip and then transitioned to amiodarone. The plan is to use this for short-term of about 8 weeks. She is followed by cardiology.  She feels improved since admission. She still has dyspnea on exertion, chronic cough with white mucus production. She denies any fevers, chills. She still suffers from atypical chest pain chest pain, palpitation. She is now off the prednisone and continues on the Symbicort but is taking it only once daily.  Interim History: Please her hospitalization in December with NST MI status post catheterization and stent placement. Also noted to have low hemoglobin of 6.5. Transfused for an deficiency anemia.  Outpatient Encounter Prescriptions as of 05/31/2016  Medication Sig  . acetaminophen (TYLENOL) 500 MG tablet Take 1,000 mg by mouth every 6 (six) hours as needed for moderate pain or headache.  . albuterol (PROVENTIL) (2.5 MG/3ML) 0.083% nebulizer solution Take 3 mLs (2.5 mg total) by nebulization every 6 (six) hours as needed for wheezing or shortness of breath.  Marland Kitchen amiodarone (PACERONE) 200 MG tablet Take 1 tablet (200 mg total) by mouth daily.  Marland Kitchen amLODipine (NORVASC) 5 MG tablet Take 1 tablet (5 mg total) by mouth daily.  Marland Kitchen buPROPion (WELLBUTRIN XL) 300 MG 24 hr tablet Take 300 mg by mouth daily.   . clopidogrel (PLAVIX) 75 MG tablet Take 1 tablet (75 mg total) by mouth daily.  . diphenhydrAMINE (BENADRYL) 25 MG tablet Take 50 mg by mouth at  bedtime as needed for sleep.  . ferrous sulfate (FERROUSUL) 325 (65 FE) MG tablet Take 1 tablet (325 mg total) by mouth daily with breakfast.  . levocetirizine (XYZAL) 5 MG tablet Take 5 mg by mouth at bedtime.   . metoprolol succinate (TOPROL-XL) 25 MG 24 hr tablet Take 1 tablet (25 mg total) by mouth daily. Take with or immediately following a meal.  . polyethylene glycol-electrolytes (TRILYTE) 420 g solution Take 4,000 mLs by mouth as directed.  . simvastatin (ZOCOR) 20 MG tablet Take 20 mg by mouth daily at 6 PM.   . SYMBICORT 160-4.5 MCG/ACT inhaler Inhale 1 puff into the lungs daily. (Patient taking differently: Inhale 1 puff into the lungs 2 (two) times daily. )  . nitroGLYCERIN (NITROSTAT) 0.4 MG SL tablet Place 1 tablet (0.4 mg total) under the tongue once.   No facility-administered encounter medications on file as of 05/31/2016.     Allergies as of 05/31/2016 - Review Complete 05/31/2016  Allergen Reaction Noted  . Sulfa antibiotics Hives 08/02/2013    Past Medical History:  Diagnosis Date  . Atrial flutter ([facility])    eliquis since 11/2015  . Breast cancer ([facility])    remote  . CAD in native artery 03/16/2016  . COPD (chronic obstructive pulmonary disease) ([facility])   . Hypertension   . S/P angioplasty with stent 03/15/16 DES Resolute, [place] 03/16/2016    Past Surgical History:  Procedure Laterality Date  . APPENDECTOMY    . BREAST SURGERY    .  CARDIAC CATHETERIZATION N/A 03/15/2016   Procedure: Left Heart Cath and Coronary Angiography;  Surgeon: Belva Crome, MD;  Location: [facility];  Service: Cardiovascular;  Laterality: N/A;  . CARDIAC CATHETERIZATION N/A 03/15/2016   Procedure: Coronary Stent Intervention;  Surgeon: Belva Crome, MD;  Location: [facility];  Service: Cardiovascular;  Laterality: N/A;  . COLONOSCOPY     remote  . COLONOSCOPY N/A 05/26/2016   Procedure: COLONOSCOPY;  Surgeon: Daneil Dolin, MD;  Location: [facility];  Service: Endoscopy;   Laterality: N/A;  845  . ESOPHAGOGASTRODUODENOSCOPY N/A 05/26/2016   Procedure: ESOPHAGOGASTRODUODENOSCOPY (EGD);  Surgeon: Daneil Dolin, MD;  Location: [facility];  Service: Endoscopy;  Laterality: N/A;  Venia Minks DILATION N/A 05/26/2016   Procedure: Venia Minks DILATION;  Surgeon: Daneil Dolin, MD;  Location: [facility];  Service: Endoscopy;  Laterality: N/A;    Family History  Problem Relation Age of Onset  . Heart disease Mother   . COPD Father   . Cancer Sister     unknown primary  . Cancer Brother     unknown primary  . Colon cancer Neg Hx     Social History   Social History  . Marital status: Divorced    Spouse name: N/A  . Number of children: N/A  . Years of education: N/A   Occupational History  . Not on file.   Social History Main Topics  . Smoking status: Former Smoker    Packs/day: 1.00    Years: 50.00    Types: Cigarettes    Start date: 12/15/1965    Quit date: 10/29/2015  . Smokeless tobacco: Never Used  . Alcohol use No  . Drug use: No  . Sexual activity: Not Currently   Other Topics Concern  . Not on file   Social History Narrative  . No narrative on file     Review of systems: Review of Systems  Constitutional: Negative for fever and chills.  HENT: Negative.   Eyes: Negative for blurred vision.  Respiratory: as per HPI  Cardiovascular: Negative for chest pain and palpitations.  Gastrointestinal: Negative for vomiting, diarrhea, blood per rectum. Genitourinary: Negative for dysuria, urgency, frequency and hematuria.  Musculoskeletal: Negative for myalgias, back pain and joint pain.  Skin: Negative for itching and rash.  Neurological: Negative for dizziness, tremors, focal weakness, seizures and loss of consciousness.  Endo/Heme/Allergies: Negative for environmental allergies.  Psychiatric/Behavioral: Negative for depression, suicidal ideas and hallucinations.  All other systems reviewed and are negative.   Physical Exam: Blood  pressure 124/62, pulse 66, height 5' 6"  (1.676 m), weight 61.4 kg (135 lb 6.4 oz), SpO2 99 %. Gen:      No acute distress HEENT:  EOMI, sclera anicteric Neck:     No masses; no thyromegaly Lungs:    Clear to auscultation bilaterally; normal respiratory effort CV:         Regular rate and rhythm; no murmurs Abd:      + bowel sounds; soft, non-tender; no palpable masses, no distension Ext:    No edema; adequate peripheral perfusion Skin:      Warm and dry; no rash Neuro: alert and oriented x 3 Psych: normal mood and affect  Data Reviewed: PFT  11/21/13: FVC 2.54 L (75%)  FEV1 1.68 L (66%)  FEV1/FVC 0.66  FEF 25-75 0.83 L (40%) no bronchodilator response  TLC 5.65 L (102%)  RV 138%  DLCO uncorrected 40% Moderate obstructive defect with severe  DLCO impairment.  05/11/16 FVC 2.42 (74%) FEV1 1.6 (64%) F/F 66 TLC 84% DLCO 33%  Moderate obstructive defect with severe DLCO impairment and positive bronchodilator response  PORT CXR 7/28:Increased bilateral interstitial markings. No obvious pleural effusion. CT chest 11/26/13: Diffuse emphysematous changes, mild pulmonary fibrosis. All images reviewed  TTE 7/28:LV without regional wall motion abnormalities. LA &RA normal in size. RV normal in size and function. Pulmonary artery systolic pressure 41 mmHg. No aortic stenosis or regurgitation. No mitral stenosis but mild to moderate regurgitation directed centrally. Trivial tricuspid regurgitation. Poorly visualized pulmonic valve. No pericardial effusion.  Assessment:  Moderate COPD She continues on Symbicort. She still has some dyspnea on exertion, chronic cough with sputum production. PFTs reviewed with her today which show moderate obstruction with a postbronchodilator response. She may benefit from further optimization of her inhalers. I'll add Spiriva to the Symbicort  Past Smoker She continues to be tobacco free. She is a candidate for low-dose screening  CT.  Plan/Recommendations: - Continue symbicort. Add spiriva - Referral for low-dose screening CT  Return in 6 months.  Marshell Garfinkel MD [facility] Pager [phone] 05/31/2016, 11:20 AM  CC: Celene Squibb, MD

## 2016-05-31 NOTE — Patient Instructions (Signed)
Continue the symbicort.  We will add spirivia inhaler  We will refer you for low dose screening CT of the chest  Return in 3 months

## 2016-06-01 ENCOUNTER — Telehealth: Payer: Self-pay | Admitting: Acute Care

## 2016-06-01 ENCOUNTER — Telehealth: Payer: Self-pay | Admitting: Pulmonary Disease

## 2016-06-01 DIAGNOSIS — Z87891 Personal history of nicotine dependence: Secondary | ICD-10-CM

## 2016-06-01 NOTE — Telephone Encounter (Signed)
OK to order respimat.  PM

## 2016-06-01 NOTE — Telephone Encounter (Signed)
Spoke with pt. States that on her AVS, it states that she was going to be started on Spiriva. This was not sent to her pharmacy.  PM - please advise on what type of Spiriva (Handihaler or Respimat) and the dosage. Thanks.

## 2016-06-01 NOTE — Telephone Encounter (Signed)
Spoke with pt and scheduled for [facility] 06/23/16 10:00 CT ordered Nothing further needed

## 2016-06-02 MED ORDER — TIOTROPIUM BROMIDE MONOHYDRATE 2.5 MCG/ACT IN AERS: 2.0000 | INHALATION_SPRAY | Freq: Every day | RESPIRATORY_TRACT | 5 refills | Status: DC

## 2016-06-02 NOTE — Telephone Encounter (Signed)
Spoke with pt. She is aware that we will be sending in this prescription. Rx has been sent in. Nothing further was needed.

## 2016-06-02 NOTE — Telephone Encounter (Signed)
Order 2.5 mcg

## 2016-06-02 NOTE — Telephone Encounter (Signed)
1.61mg or 2.556m?

## 2016-06-23 ENCOUNTER — Encounter: Payer: Self-pay | Admitting: Acute Care

## 2016-06-23 ENCOUNTER — Ambulatory Visit (INDEPENDENT_AMBULATORY_CARE_PROVIDER_SITE_OTHER): Payer: Medicare Other | Admitting: Acute Care

## 2016-06-23 ENCOUNTER — Ambulatory Visit (INDEPENDENT_AMBULATORY_CARE_PROVIDER_SITE_OTHER)
Admission: RE | Admit: 2016-06-23 | Discharge: 2016-06-23 | Disposition: A | Discharge status: Home / Self Care | Payer: Medicare Other | Source: Ambulatory Visit | Attending: Acute Care | Admitting: Acute Care

## 2016-06-23 DIAGNOSIS — Z87891 Personal history of nicotine dependence: Secondary | ICD-10-CM

## 2016-06-23 NOTE — Progress Notes (Signed)
Shared Decision Making Visit Lung Cancer Screening Program [phone])   Eligibility:  Age 76 y.o.  Pack Years Smoking History Calculation 56 pack year smoker (# packs/per year x # years smoked)  Recent History of coughing up blood  no  Unexplained weight loss? no ( >Than 15 pounds within the last 6 months )  Prior History Lung / other cancer no (Diagnosis within the last 5 years already requiring surveillance chest CT Scans).  Smoking Status Former Smoker  Former Smokers: Years since quit: < 1 year  Quit Date: 10/2015  Visit Components:  Discussion included one or more decision making aids. yes  Discussion included risk/benefits of screening. yes  Discussion included potential follow up diagnostic testing for abnormal scans. yes  Discussion included meaning and risk of over diagnosis. yes  Discussion included meaning and risk of False Positives. yes  Discussion included meaning of total radiation exposure. yes  Counseling Included:  Importance of adherence to annual lung cancer LDCT screening. yes  Impact of comorbidities on ability to participate in the program. yes  Ability and willingness to under diagnostic treatment. yes  Smoking Cessation Counseling:  Current Smokers:   Discussed importance of smoking cessation. yes  Information about tobacco cessation classes and interventions provided to patient. yes  Patient provided with "ticket" for LDCT Scan. yes  Symptomatic Patient. no  Counseling  Diagnosis Code: Tobacco Use Z72.0  Asymptomatic Patient yes  Counseling (Intermediate counseling: > three minutes counseling) H8299  Former Smokers:   Discussed the importance of maintaining cigarette abstinence. yes  Diagnosis Code: Personal History of Nicotine Dependence. B71.696  Information about tobacco cessation classes and interventions provided to patient. Yes  Patient provided with "ticket" for LDCT Scan. yes  Written Order for Lung Cancer  Screening with LDCT placed in Epic. Yes (CT Chest Lung Cancer Screening Low Dose W/O CM) [id number] Z12.2-Screening of respiratory organs Z87.891-Personal history of nicotine dependence  I spent 25 minutes of face to face time with Patricia Jackson discussing the risks and benefits of lung cancer screening. We viewed a power point together that explained in detail the above noted topics. We took the time to pause the power point at intervals to allow for questions to be asked and answered to ensure understanding. We discussed that she had taken the single most powerful action possible to decrease her risk of developing lung cancer when she quit smoking. I counseled her to remain smoke free, and to contact me if she ever had the desire to smoke again so that I can provide resources and tools to help support the effort to remain smoke free. We discussed the time and location of the scan, and that either Patricia Glassman, Patricia Jackson or I will call with the results within  24-48 hours of receiving them. She has my card and contact information in the event she needs to speak with me, in addition to a copy of the power point we reviewed as a resource. She verbalized understanding of all of the above and had no further questions upon leaving the office  We spent 3 minutes counseling on continued smoking abstinence.  We discussed that we are noting a high percentage of CAD on these scans. Patricia Jackson is currently on statin therapy and is followed by Dr. Harl Bowie, cards. She has a history of MI. She verbalized understanding of the above.  Magdalen Spatz, NP 06/23/2016

## 2016-07-07 ENCOUNTER — Other Ambulatory Visit: Payer: Self-pay | Admitting: Acute Care

## 2016-07-07 DIAGNOSIS — Z87891 Personal history of nicotine dependence: Secondary | ICD-10-CM

## 2016-08-11 DIAGNOSIS — R7301 Impaired fasting glucose: Secondary | ICD-10-CM | POA: Diagnosis not present

## 2016-08-11 DIAGNOSIS — I10 Essential (primary) hypertension: Secondary | ICD-10-CM | POA: Diagnosis not present

## 2016-08-13 DIAGNOSIS — I482 Chronic atrial fibrillation: Secondary | ICD-10-CM | POA: Diagnosis not present

## 2016-08-13 DIAGNOSIS — R7301 Impaired fasting glucose: Secondary | ICD-10-CM | POA: Diagnosis not present

## 2016-08-13 DIAGNOSIS — N183 Chronic kidney disease, stage 3 (moderate): Secondary | ICD-10-CM | POA: Diagnosis not present

## 2016-08-13 DIAGNOSIS — E782 Mixed hyperlipidemia: Secondary | ICD-10-CM | POA: Diagnosis not present

## 2016-08-13 DIAGNOSIS — J449 Chronic obstructive pulmonary disease, unspecified: Secondary | ICD-10-CM | POA: Diagnosis not present

## 2016-08-23 ENCOUNTER — Ambulatory Visit (INDEPENDENT_AMBULATORY_CARE_PROVIDER_SITE_OTHER): Payer: Medicare Other | Admitting: Gastroenterology

## 2016-08-23 ENCOUNTER — Encounter: Payer: Self-pay | Admitting: Gastroenterology

## 2016-08-23 VITALS — BP 114/64 | HR 44 | Temp 96.3°F | Ht 67.0 in | Wt 137.0 lb

## 2016-08-23 DIAGNOSIS — D509 Iron deficiency anemia, unspecified: Secondary | ICD-10-CM | POA: Diagnosis not present

## 2016-08-23 DIAGNOSIS — R1310 Dysphagia, unspecified: Secondary | ICD-10-CM | POA: Diagnosis not present

## 2016-08-23 DIAGNOSIS — R1319 Other dysphagia: Secondary | ICD-10-CM

## 2016-08-23 NOTE — Assessment & Plan Note (Signed)
Hgb normal. Egd/tcs as outlined. Remains on Plavix. Cardiology previously wanted patient to be on ASA 81mg  daily as well. Would recommend she stay on PPI chronically. Given current normal Hgb would be ok to resume ASA 81mg  daily with close monitoring for recurrent anemia. If recurrent anemia, would consider small bowel capsule study.   Obtain copy of upcoming labs from PCP.

## 2016-08-23 NOTE — Progress Notes (Signed)
CC'D TO PCP °

## 2016-08-23 NOTE — Progress Notes (Signed)
Please let patient know that her cardiologist originally wanted her back on ASA 81mg  daily once we finished our GI work up. It is okay with [place] if she resumes it. If she has questions about if she really needs it I would like for her to discuss with her cardiologist.   We will await labs from PCP in 09/2016. Plans to keep check on her Hgb to make sure she is tolerating the ASA.

## 2016-08-23 NOTE — Progress Notes (Signed)
Primary Care Physician: Celene Squibb, MD  Primary Gastroenterologist:  Garfield Cornea, MD   Chief Complaint  Patient presents with  . Dysphagia    pp f/u, has to burp few timesafter eating/drinking, feels like it gets stuck in chest    HPI: Patricia Jackson is a 76 y.o. female here for follow-up. She was initially seen during hospitalization in January 2018 when she presented with profound shortness of breath and was found to have a hemoglobin is 6.5. She was heme-negative. Ferritin was normal, TIBC normal but her iron and iron saturations were slightly low. She received 2 units of packed red blood cells at that time with hemoglobin improving into the 9 range. Patient's history also significant for recent non-STEMI with cardiac catheterization 12 2017 requiring stenting. Initially she was started on aspirin, Brilinta, in addition to her Eliquis for one month with plans to stop aspirin therapy after that. However she was hospitalized or shortness of breath was questioned whether or not it was related to the Brilinta. At that time she was switched to Plavix.   She recently underwent an upper endoscopyTCS on 05/26/2016. She had LA grade a esophagitis, no Barrett's. Esophagus was dilated with a 54 [place] Maloney dilator. She has small hiatal hernia. She was started on Protonix 20 mg daily.  She had multiple diverticula found in the sigmoid and descending colon. 2 sessile polyps removed from the hepatic flexure measuring 5-6 mm in size. Pathology revealed tubular adenomas. Dr. Buford Dresser recommended no future colonoscopies unless new symptoms develop.  Patient states that her dysphagia is about the same. Really no significant improvement after dilation. When she eats or takes her medication she feels like something is stuck in her chest. Interestingly however she gets relief if she belches. She denies heartburn. She's taking protonic 20 mg daily. She brought recent labs done 08/11/2016. Her white blood  cell count was 5800, hemoglobin 13, MCV 90, platelets 2 33,000, glucose 96, BUN 31, creatinine 1.72, LFTs normal, hemoglobin A1c 6.1. She sees Dr. Nevada Crane on 09/10/2016 for repeat blood work.    Current Outpatient Prescriptions  Medication Sig Dispense Refill  . acetaminophen (TYLENOL) 500 MG tablet Take 1,000 mg by mouth every 6 (six) hours as needed for moderate pain or headache.    . albuterol (PROVENTIL) (2.5 MG/3ML) 0.083% nebulizer solution Take 3 mLs (2.5 mg total) by nebulization every 6 (six) hours as needed for wheezing or shortness of breath. 75 mL 12  . amiodarone (PACERONE) 200 MG tablet Take 1 tablet (200 mg total) by mouth daily. 30 tablet 11  . amLODipine (NORVASC) 5 MG tablet Take 1 tablet (5 mg total) by mouth daily. 30 tablet 6  . buPROPion (WELLBUTRIN XL) 300 MG 24 hr tablet Take 300 mg by mouth daily.     . clopidogrel (PLAVIX) 75 MG tablet Take 1 tablet (75 mg total) by mouth daily. 90 tablet 3  . diphenhydrAMINE (BENADRYL) 25 MG tablet Take 50 mg by mouth at bedtime as needed for sleep.    . ferrous sulfate (FERROUSUL) 325 (65 FE) MG tablet Take 1 tablet (325 mg total) by mouth daily with breakfast. 30 tablet 0  . levocetirizine (XYZAL) 5 MG tablet Take 5 mg by mouth at bedtime.     . metoprolol succinate (TOPROL-XL) 25 MG 24 hr tablet Take 1 tablet (25 mg total) by mouth daily. Take with or immediately following a meal. 90 tablet 3  . nitroGLYCERIN (NITROSTAT) 0.4 MG SL tablet Place  1 tablet (0.4 mg total) under the tongue once. (Patient taking differently: Place 0.4 mg under the tongue every 5 (five) minutes as needed. ) 25 tablet 4  . pantoprazole (PROTONIX) 20 MG tablet Take 1 tablet by mouth daily.    . rosuvastatin (CRESTOR) 20 MG tablet Take 1 tablet by mouth daily.    . SYMBICORT 160-4.5 MCG/ACT inhaler Inhale 1 puff into the lungs daily. (Patient taking differently: Inhale 1 puff into the lungs 2 (two) times daily. ) 1 Inhaler 5  . Tiotropium Bromide Monohydrate  (SPIRIVA RESPIMAT) 2.5 MCG/ACT AERS Inhale 2 puffs into the lungs daily. 1 Inhaler 5   No current facility-administered medications for this visit.     Allergies as of 08/23/2016 - Review Complete 08/23/2016  Allergen Reaction Noted  . Sulfa antibiotics Hives 08/02/2013   Past Medical History:  Diagnosis Date  . Atrial flutter ([place])    eliquis since 11/2015  . Breast cancer ([place])    remote  . CAD in native artery 03/16/2016  . COPD (chronic obstructive pulmonary disease) ([place])   . Hypertension   . S/P angioplasty with stent 03/15/16 DES Resolute, [place] 03/16/2016   Past Surgical History:  Procedure Laterality Date  . APPENDECTOMY    . BREAST SURGERY    . CARDIAC CATHETERIZATION N/A 03/15/2016   Procedure: Left Heart Cath and Coronary Angiography;  Surgeon: Belva Crome, MD;  Location: [facility];  Service: Cardiovascular;  Laterality: N/A;  . CARDIAC CATHETERIZATION N/A 03/15/2016   Procedure: Coronary Stent Intervention;  Surgeon: Belva Crome, MD;  Location: [facility];  Service: Cardiovascular;  Laterality: N/A;  . COLONOSCOPY     remote  . COLONOSCOPY N/A 05/26/2016   Procedure: COLONOSCOPY;  Surgeon: Daneil Dolin, MD;  [facility];  Service: Endoscopy;  Laterality: N/A;  845  . ESOPHAGOGASTRODUODENOSCOPY N/A 05/26/2016   Procedure: ESOPHAGOGASTRODUODENOSCOPY (EGD);  Surgeon: Daneil Dolin, MD;  [facility];  Service: Endoscopy;  Laterality: N/A;  Venia Minks DILATION N/A 05/26/2016   Procedure: Venia Minks DILATION;  Surgeon: Daneil Dolin, MD;  [facility];  Service: Endoscopy;  Laterality: N/A;    ROS:  General: Negative for anorexia, weight loss, fever, chills, fatigue, weakness. ENT: Negative for hoarseness, difficulty swallowing , nasal congestion. CV: Negative for chest pain, angina, palpitations, +dyspnea on exertion, peripheral edema.  Respiratory: Negative for dyspnea at rest, +dyspnea on exertion, cough, sputum,  wheezing.  GI: See history of present illness. GU:  Negative for dysuria, hematuria, urinary incontinence, urinary frequency, nocturnal urination.  Endo: Negative for unusual weight change.    Physical Examination:   BP 114/64   Pulse (!) 44   Temp (!) 96.3 F (35.7 C) (Oral)   Ht 5\' 7"  (1.702 m)   Wt 137 lb (62.1 kg)   BMI 21.46 kg/m   General: chronically ill-appearing WF, no acute distress.  Eyes: No icterus. Mouth: Oropharyngeal mucosa moist and pink , no lesions erythema or exudate. Lungs: Clear to auscultation bilaterally.  Heart: Regular rate and rhythm, no murmurs rubs or gallops.  Abdomen: Bowel sounds are normal, nontender, nondistended, no hepatosplenomegaly or masses, no abdominal bruits or hernia , no rebound or guarding.   Extremities: No lower extremity edema. No clubbing or deformities. Neuro: Alert and oriented x 4   Skin: Warm and dry, no jaundice.   Psych: Alert and cooperative, normal mood and affect.  Labs:  As outlined above. Copy to be scanned.  Imaging Studies: No results found.

## 2016-08-23 NOTE — Assessment & Plan Note (Signed)
Evaluated further with barium pill esophagram.

## 2016-08-23 NOTE — Patient Instructions (Signed)
1. Xray of your esophagus as scheduled.

## 2016-08-26 ENCOUNTER — Ambulatory Visit (HOSPITAL_COMMUNITY)
Admission: RE | Admit: 2016-08-26 | Discharge: 2016-08-26 | Disposition: A | Discharge status: Home / Self Care | Payer: Medicare Other | Source: Ambulatory Visit | Attending: Gastroenterology | Admitting: Gastroenterology

## 2016-08-26 DIAGNOSIS — K224 Dyskinesia of esophagus: Secondary | ICD-10-CM | POA: Diagnosis not present

## 2016-08-26 DIAGNOSIS — R1310 Dysphagia, unspecified: Secondary | ICD-10-CM | POA: Insufficient documentation

## 2016-08-26 DIAGNOSIS — D509 Iron deficiency anemia, unspecified: Secondary | ICD-10-CM

## 2016-08-26 DIAGNOSIS — R4702 Dysphasia: Secondary | ICD-10-CM | POA: Diagnosis not present

## 2016-08-26 DIAGNOSIS — R1319 Other dysphagia: Secondary | ICD-10-CM

## 2016-08-31 ENCOUNTER — Ambulatory Visit: Payer: Medicare Other | Admitting: Pulmonary Disease

## 2016-09-06 NOTE — Progress Notes (Signed)
Please let patient know there was no evidence of stricture in the esophagus. She had age related esophageal motility issues, basically required extra swallows to help the liquid and food go down. Would advise her to use plenty of liquids when taking pills, eating. After she swallows something, take a couple of extra swallows to help the food/pills go down.  See addendum to my office note regarding resuming aspirin.  Return to the office in six months or sooner if needed.

## 2016-09-07 ENCOUNTER — Encounter: Payer: Self-pay | Admitting: Internal Medicine

## 2016-09-07 NOTE — Progress Notes (Signed)
Pt is aware.  

## 2016-09-07 NOTE — Progress Notes (Signed)
APPT MADE AND LETTER SENT  °

## 2016-09-08 ENCOUNTER — Emergency Department (HOSPITAL_COMMUNITY): Payer: Medicare Other

## 2016-09-08 ENCOUNTER — Observation Stay (HOSPITAL_COMMUNITY)
Admission: EM | Admit: 2016-09-08 | Discharge: 2016-09-09 | Disposition: A | Discharge status: Home / Self Care | Payer: Medicare Other | Attending: Family Medicine | Admitting: Family Medicine

## 2016-09-08 ENCOUNTER — Encounter (HOSPITAL_COMMUNITY): Payer: Self-pay | Admitting: Emergency Medicine

## 2016-09-08 DIAGNOSIS — Z79899 Other long term (current) drug therapy: Secondary | ICD-10-CM | POA: Insufficient documentation

## 2016-09-08 DIAGNOSIS — I2510 Atherosclerotic heart disease of native coronary artery without angina pectoris: Secondary | ICD-10-CM | POA: Insufficient documentation

## 2016-09-08 DIAGNOSIS — Z853 Personal history of malignant neoplasm of breast: Secondary | ICD-10-CM | POA: Diagnosis not present

## 2016-09-08 DIAGNOSIS — N179 Acute kidney failure, unspecified: Secondary | ICD-10-CM | POA: Diagnosis present

## 2016-09-08 DIAGNOSIS — R0602 Shortness of breath: Principal | ICD-10-CM | POA: Insufficient documentation

## 2016-09-08 DIAGNOSIS — I10 Essential (primary) hypertension: Secondary | ICD-10-CM | POA: Diagnosis not present

## 2016-09-08 DIAGNOSIS — N184 Chronic kidney disease, stage 4 (severe): Secondary | ICD-10-CM | POA: Diagnosis present

## 2016-09-08 DIAGNOSIS — R0600 Dyspnea, unspecified: Secondary | ICD-10-CM

## 2016-09-08 DIAGNOSIS — Z87891 Personal history of nicotine dependence: Secondary | ICD-10-CM | POA: Insufficient documentation

## 2016-09-08 DIAGNOSIS — R739 Hyperglycemia, unspecified: Secondary | ICD-10-CM | POA: Diagnosis present

## 2016-09-08 DIAGNOSIS — J449 Chronic obstructive pulmonary disease, unspecified: Secondary | ICD-10-CM | POA: Insufficient documentation

## 2016-09-08 DIAGNOSIS — R0609 Other forms of dyspnea: Secondary | ICD-10-CM | POA: Diagnosis present

## 2016-09-08 LAB — D-DIMER, QUANTITATIVE: D-Dimer, Quant: 1.12 ug/mL-FEU — ABNORMAL HIGH (ref 0.00–0.50)

## 2016-09-08 LAB — COMPREHENSIVE METABOLIC PANEL
ALT: 54 U/L (ref 14–54)
AST: 60 U/L — ABNORMAL HIGH (ref 15–41)
Albumin: 3.5 g/dL (ref 3.5–5.0)
Alkaline Phosphatase: 83 U/L (ref 38–126)
Anion gap: 7 (ref 5–15)
BUN: 26 mg/dL — ABNORMAL HIGH (ref 6–20)
CO2: 23 mmol/L (ref 22–32)
Calcium: 8.9 mg/dL (ref 8.9–10.3)
Chloride: 107 mmol/L (ref 101–111)
Creatinine, Ser: 1.76 mg/dL — ABNORMAL HIGH (ref 0.44–1.00)
GFR calc Af Amer: 32 mL/min — ABNORMAL LOW (ref >60)
GFR calc non Af Amer: 28 mL/min — ABNORMAL LOW (ref >60)
Glucose, Bld: 137 mg/dL — ABNORMAL HIGH (ref 65–99)
Potassium: 4.1 mmol/L (ref 3.5–5.1)
Sodium: 137 mmol/L (ref 135–145)
Total Bilirubin: 0.4 mg/dL (ref 0.3–1.2)
Total Protein: 7.2 g/dL (ref 6.5–8.1)

## 2016-09-08 LAB — CBC WITH DIFFERENTIAL/PLATELET
Basophils Absolute: 0 10*3/uL (ref 0.0–0.1)
Basophils Relative: 0 %
Eosinophils Absolute: 0.2 10*3/uL (ref 0.0–0.7)
Eosinophils Relative: 3 %
HCT: 37.1 % (ref 36.0–46.0)
Hemoglobin: 12 g/dL (ref 12.0–15.0)
Lymphocytes Relative: 17 %
Lymphs Abs: 1 10*3/uL (ref 0.7–4.0)
MCH: 28.4 pg (ref 26.0–34.0)
MCHC: 32.3 g/dL (ref 30.0–36.0)
MCV: 87.7 fL (ref 78.0–100.0)
Monocytes Absolute: 0.4 10*3/uL (ref 0.1–1.0)
Monocytes Relative: 6 %
Neutro Abs: 4.5 10*3/uL (ref 1.7–7.7)
Neutrophils Relative %: 74 %
Platelets: 263 10*3/uL (ref 150–400)
RBC: 4.23 MIL/uL (ref 3.87–5.11)
RDW: 14.9 % (ref 11.5–15.5)
WBC: 6.1 10*3/uL (ref 4.0–10.5)

## 2016-09-08 LAB — TROPONIN I: Troponin I: <0.03 ng/mL (ref <0.03)

## 2016-09-08 LAB — BRAIN NATRIURETIC PEPTIDE: B Natriuretic Peptide: 143 pg/mL — ABNORMAL HIGH (ref 0.0–100.0)

## 2016-09-08 MED ORDER — TECHNETIUM TO 99M ALBUMIN AGGREGATED
4.0000 | Freq: Once | INTRAVENOUS | Status: AC | PRN
  Administered 2016-09-08: 4.4 via INTRAVENOUS

## 2016-09-08 MED ORDER — LEVOCETIRIZINE DIHYDROCHLORIDE 5 MG PO TABS: 5.0000 mg | ORAL_TABLET | Freq: Every day | ORAL | Status: DC

## 2016-09-08 MED ORDER — AMIODARONE HCL 200 MG PO TABS
200.0000 mg | ORAL_TABLET | Freq: Every day | ORAL | Status: DC
Start: 2016-09-09 — End: 2016-09-09
  Administered 2016-09-09: 200 mg via ORAL
  Filled 2016-09-08: qty 1

## 2016-09-08 MED ORDER — ACETAMINOPHEN 325 MG PO TABS: 650.0000 mg | ORAL_TABLET | Freq: Four times a day (QID) | ORAL | Status: DC | PRN

## 2016-09-08 MED ORDER — BUPROPION HCL ER (XL) 300 MG PO TB24
300.0000 mg | ORAL_TABLET | Freq: Every day | ORAL | Status: DC
  Administered 2016-09-09: 300 mg via ORAL
  Filled 2016-09-08: qty 1

## 2016-09-08 MED ORDER — PANTOPRAZOLE SODIUM 20 MG PO TBEC
20.0000 mg | DELAYED_RELEASE_TABLET | Freq: Every day | ORAL | Status: DC
  Filled 2016-09-08 (×2): qty 1

## 2016-09-08 MED ORDER — TECHNETIUM TC 99M DIETHYLENETRIAME-PENTAACETIC ACID
30.0000 | Freq: Once | INTRAVENOUS | Status: AC | PRN
  Administered 2016-09-08: 33 via RESPIRATORY_TRACT

## 2016-09-08 MED ORDER — ACETAMINOPHEN 650 MG RE SUPP: 650.0000 mg | Freq: Four times a day (QID) | RECTAL | Status: DC | PRN

## 2016-09-08 MED ORDER — ONDANSETRON HCL 4 MG/2ML IJ SOLN
4.0000 mg | Freq: Four times a day (QID) | INTRAMUSCULAR | Status: DC | PRN
Start: 2016-09-08 — End: 2016-09-09

## 2016-09-08 MED ORDER — CLOPIDOGREL BISULFATE 75 MG PO TABS
75.0000 mg | ORAL_TABLET | Freq: Every day | ORAL | Status: DC
  Administered 2016-09-09: 75 mg via ORAL
  Filled 2016-09-08: qty 1

## 2016-09-08 MED ORDER — TRAMADOL HCL 50 MG PO TABS
50.0000 mg | ORAL_TABLET | Freq: Four times a day (QID) | ORAL | Status: DC | PRN
  Administered 2016-09-08: 50 mg via ORAL
  Filled 2016-09-08: qty 1

## 2016-09-08 MED ORDER — MOMETASONE FURO-FORMOTEROL FUM 200-5 MCG/ACT IN AERO
2.0000 | INHALATION_SPRAY | Freq: Two times a day (BID) | RESPIRATORY_TRACT | Status: DC
  Filled 2016-09-08: qty 8.8

## 2016-09-08 MED ORDER — DOCUSATE SODIUM 100 MG PO CAPS
100.0000 mg | ORAL_CAPSULE | Freq: Two times a day (BID) | ORAL | Status: DC
  Administered 2016-09-08 – 2016-09-09 (×2): 100 mg via ORAL
  Filled 2016-09-08 (×2): qty 1

## 2016-09-08 MED ORDER — DIPHENHYDRAMINE HCL 25 MG PO CAPS
50.0000 mg | ORAL_CAPSULE | Freq: Every evening | ORAL | Status: DC | PRN
  Filled 2016-09-08: qty 2

## 2016-09-08 MED ORDER — AMLODIPINE BESYLATE 5 MG PO TABS
5.0000 mg | ORAL_TABLET | Freq: Every day | ORAL | Status: DC
  Administered 2016-09-09: 5 mg via ORAL
  Filled 2016-09-08: qty 1

## 2016-09-08 MED ORDER — PNEUMOCOCCAL VAC POLYVALENT 25 MCG/0.5ML IJ INJ: 0.5000 mL | INJECTION | INTRAMUSCULAR | Status: DC

## 2016-09-08 MED ORDER — FERROUS SULFATE 325 (65 FE) MG PO TABS
325.0000 mg | ORAL_TABLET | Freq: Every day | ORAL | Status: DC
  Administered 2016-09-09: 325 mg via ORAL
  Filled 2016-09-08 (×2): qty 1

## 2016-09-08 MED ORDER — ROSUVASTATIN CALCIUM 20 MG PO TABS
20.0000 mg | ORAL_TABLET | Freq: Every day | ORAL | Status: DC
  Administered 2016-09-09: 20 mg via ORAL
  Filled 2016-09-08: qty 1

## 2016-09-08 MED ORDER — ENOXAPARIN SODIUM 30 MG/0.3ML ~~LOC~~ SOLN
30.0000 mg | SUBCUTANEOUS | Status: DC
  Administered 2016-09-08: 30 mg via SUBCUTANEOUS
  Filled 2016-09-08: qty 0.3

## 2016-09-08 MED ORDER — TIOTROPIUM BROMIDE MONOHYDRATE 2.5 MCG/ACT IN AERS: 2.0000 | INHALATION_SPRAY | Freq: Every day | RESPIRATORY_TRACT | Status: DC

## 2016-09-08 MED ORDER — ALBUTEROL SULFATE (2.5 MG/3ML) 0.083% IN NEBU: 2.5000 mg | INHALATION_SOLUTION | RESPIRATORY_TRACT | Status: DC | PRN

## 2016-09-08 MED ORDER — ONDANSETRON HCL 4 MG PO TABS: 4.0000 mg | ORAL_TABLET | Freq: Four times a day (QID) | ORAL | Status: DC | PRN

## 2016-09-08 MED ORDER — LACTATED RINGERS IV SOLN
INTRAVENOUS | Status: DC
  Administered 2016-09-08 – 2016-09-09 (×2): via INTRAVENOUS

## 2016-09-08 NOTE — ED Provider Notes (Signed)
[facility] DEPT Provider Note   CSN: [id number] Arrival date & time: 09/08/16  1240     History   Chief Complaint Chief Complaint  Patient presents with  . Shortness of Breath    HPI Patricia Jackson is a 76 y.o. female.  Pt complains of sob with exertion for 3-4 days. Patient states she had similar symptoms when she was in the hospital and was anemic. Patient has history of COPD coronary artery disease and atrial flutter   The history is provided by the patient.  Shortness of Breath  This is a recurrent problem. The problem occurs continuously.The current episode started more than 2 days ago. The problem has not changed since onset.Pertinent negatives include no fever, no headaches, no cough, no chest pain, no abdominal pain and no rash. Precipitated by: Unknown. Risk factors: Coronary disease. She has tried nothing for the symptoms. The treatment provided no relief.    Past Medical History:  Diagnosis Date  . Atrial flutter ([place])    eliquis since 11/2015  . Breast cancer ([place])    remote  . CAD in native artery 03/16/2016  . COPD (chronic obstructive pulmonary disease) ([place])   . Hypertension   . S/P angioplasty with stent 03/15/16 DES Resolute, pLCX 03/16/2016    Patient Active Problem List   Diagnosis Date Noted  . Esophageal dysphagia   . Anemia 04/07/2016  . Sinus bradycardia   . Hx of heart artery stent   . CAD in native artery 03/16/2016  . S/P angioplasty with stent 03/15/16 DES Resolute, [place] 03/16/2016  . Acute coronary syndrome ([place])   . NSTEMI (non-ST elevated myocardial infarction) ([place]) 03/12/2016  . Shortness of breath   . Acute respiratory failure with hypoxia ([place])   . Hypophosphatemia   . 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[place]) 10/31/2015  . Acute exacerbation of chronic bronchitis ([place]) 10/31/2015  . COPD exacerbation ([place]) 10/31/2015  . Dyslipidemia 10/31/2015  . Elevated troponin 10/31/2015  . Atrial flutter with rapid ventricular response ([place])  10/30/2015  . SOB (shortness of breath) 11/26/2013  . Back pain 11/26/2013  . Arm pain 11/26/2013  . Chest pain 11/26/2013  . Hypertension     Past Surgical History:  Procedure Laterality Date  . APPENDECTOMY    . BREAST SURGERY    . CARDIAC CATHETERIZATION N/A 03/15/2016   Procedure: Left Heart Cath and Coronary Angiography;  Surgeon: Belva Crome, MD;  Location: [facility];  Service: Cardiovascular;  Laterality: N/A;  . CARDIAC CATHETERIZATION N/A 03/15/2016   Procedure: Coronary Stent Intervention;  Surgeon: Belva Crome, MD;  Location: [facility];  Service: Cardiovascular;  Laterality: N/A;  . COLONOSCOPY     remote  . COLONOSCOPY N/A 05/26/2016   Procedure: COLONOSCOPY;  Surgeon: Daneil Dolin, MD;  [facility];  Service: Endoscopy;  Laterality: N/A;  845  . ESOPHAGOGASTRODUODENOSCOPY N/A 05/26/2016   Procedure: ESOPHAGOGASTRODUODENOSCOPY (EGD);  Surgeon: Daneil Dolin, MD;  [facility];  Service: Endoscopy;  Laterality: N/A;  Venia Minks DILATION N/A 05/26/2016   Procedure: Venia Minks DILATION;  Surgeon: Daneil Dolin, MD;  [facility];  Service: Endoscopy;  Laterality: N/A;    OB History    Gravida Para Term Preterm AB Living             1   SAB TAB Ectopic Multiple Live Births                   Home Medications  Prior to Admission medications   Medication Sig Start Date End Date Taking? Authorizing Provider  acetaminophen (TYLENOL) 500 MG tablet Take 500 mg by mouth every 6 (six) hours as needed for moderate pain or headache.    Yes [provider]  albuterol (PROVENTIL) (2.5 MG/3ML) 0.083% nebulizer solution Take 3 mLs (2.5 mg total) by nebulization every 6 (six) hours as needed for wheezing or shortness of breath. 11/08/15  Yes Almyra Deforest, PA  amiodarone (PACERONE) 200 MG tablet Take 1 tablet (200 mg total) by mouth daily. 05/18/16  Yes Lendon Colonel, NP  amLODipine (NORVASC) 5 MG tablet Take 1 tablet (5  mg total) by mouth daily. 03/16/16  Yes Isaiah Serge, NP  buPROPion (WELLBUTRIN XL) 300 MG 24 hr tablet Take 300 mg by mouth daily.  10/10/15  Yes [provider]  clopidogrel (PLAVIX) 75 MG tablet Take 1 tablet (75 mg total) by mouth daily. 04/06/16  Yes Lendon Colonel, NP  diphenhydrAMINE (BENADRYL) 25 MG tablet Take 50 mg by mouth at bedtime as needed for sleep.   Yes [provider]  ferrous sulfate (FERROUSUL) 325 (65 FE) MG tablet Take 1 tablet (325 mg total) by mouth daily with breakfast. 04/08/16  Yes Rosita Fire, MD  levocetirizine (XYZAL) 5 MG tablet Take 5 mg by mouth at bedtime.    Yes [provider]  metoprolol succinate (TOPROL-XL) 25 MG 24 hr tablet Take 1 tablet (25 mg total) by mouth daily. Take with or immediately following a meal. 04/20/16 09/08/16 Yes Lendon Colonel, NP  nitroGLYCERIN (NITROSTAT) 0.4 MG SL tablet Place 1 tablet (0.4 mg total) under the tongue once. Patient taking differently: Place 0.4 mg under the tongue every 5 (five) minutes as needed.  03/16/16 09/08/16 Yes Isaiah Serge, NP  pantoprazole (PROTONIX) 20 MG tablet Take 1 tablet by mouth daily. 07/21/16  Yes [provider]  rosuvastatin (CRESTOR) 20 MG tablet Take 1 tablet by mouth daily. 08/13/16  Yes [provider]  SYMBICORT 160-4.5 MCG/ACT inhaler Inhale 1 puff into the lungs daily. Patient taking differently: Inhale 2 puffs into the lungs 2 (two) times daily.  02/16/16  Yes Mannam, Praveen, MD  Tiotropium Bromide Monohydrate (SPIRIVA RESPIMAT) 2.5 MCG/ACT AERS Inhale 2 puffs into the lungs daily. 06/02/16  Yes Mannam, Praveen, MD  traMADol (ULTRAM) 50 MG tablet Take 50 mg by mouth 4 (four) times daily as needed.  08/13/16  Yes [provider]    Family History Family History  Problem Relation Age of Onset  . Heart disease Mother   . COPD Father   . Cancer Sister        unknown primary  . Cancer Brother        unknown primary  .  Colon cancer Neg Hx     Social History Social History  Substance Use Topics  . Smoking status: Former Smoker    Packs/day: 1.00    Years: 56.00    Types: Cigarettes    Start date: 12/15/1965    Quit date: 10/29/2015  . Smokeless tobacco: Never Used  . Alcohol use No     Allergies   Sulfa antibiotics   Review of Systems Review of Systems  Constitutional: Negative for appetite change, fatigue and fever.  HENT: Negative for congestion, ear discharge and sinus pressure.   Eyes: Negative for discharge.  Respiratory: Positive for shortness of breath. Negative for cough.   Cardiovascular: Negative for chest pain.  Gastrointestinal: Negative for abdominal  pain and diarrhea.  Genitourinary: Negative for frequency and hematuria.  Musculoskeletal: Negative for back pain.  Skin: Negative for rash.  Neurological: Negative for seizures and headaches.  Psychiatric/Behavioral: Negative for hallucinations.     Physical Exam Updated Vital Signs BP (!) 127/47   Pulse (!) 49   Temp 97.6 F (36.4 C) (Oral)   Resp 17   Ht 5\' 7"  (1.702 m)   Wt 62.1 kg (137 lb)   SpO2 99%   BMI 21.46 kg/m   Physical Exam  Constitutional: She is oriented to person, place, and time. She appears well-developed.  HENT:  Head: Normocephalic.  Eyes: Conjunctivae and EOM are normal. No scleral icterus.  Neck: Neck supple. No thyromegaly present.  Cardiovascular: Normal rate and regular rhythm.  Exam reveals no gallop and no friction rub.   No murmur heard. Pulmonary/Chest: No stridor. She has no wheezes. She has no rales. She exhibits no tenderness.  Abdominal: She exhibits no distension. There is no tenderness. There is no rebound.  Musculoskeletal: Normal range of motion. She exhibits no edema.  Lymphadenopathy:    She has no cervical adenopathy.  Neurological: She is oriented to person, place, and time. She exhibits normal muscle tone. Coordination normal.  Skin: No rash noted. No erythema.    Psychiatric: She has a normal mood and affect. Her behavior is normal.     ED Treatments / Results  Labs (all labs ordered are listed, but only abnormal results are displayed) Labs Reviewed  COMPREHENSIVE METABOLIC PANEL - Abnormal; Notable for the following:       Result Value   Glucose, Bld 137 (*)    BUN 26 (*)    Creatinine, Ser 1.76 (*)    AST 60 (*)    GFR calc non Af Amer 28 (*)    GFR calc Af Amer 32 (*)    All other components within normal limits  D-DIMER, QUANTITATIVE (NOT AT [facility]) - Abnormal; Notable for the following:    D-Dimer, Quant 1.12 (*)    All other components within normal limits  BRAIN NATRIURETIC PEPTIDE - Abnormal; Notable for the following:    B Natriuretic Peptide 143.0 (*)    All other components within normal limits  CBC WITH DIFFERENTIAL/PLATELET  TROPONIN I    EKG  EKG Interpretation None       Radiology Dg Chest Portable 1 View  Result Date: 09/08/2016 CLINICAL DATA:  Shortness of breath over the last week, worse with walking. EXAM: PORTABLE CHEST 1 VIEW COMPARISON:  04/03/2016 FINDINGS: Heart size is normal. Aortic atherosclerosis. Chronic pulmonary hyperinflation with chronic interstitial lung markings. No sign of active infiltrate, mass, effusion or collapse. No pulmonary edema. No acute bone finding. Previous left mastectomy. IMPRESSION: No active disease. Chronic pulmonary hyperinflation and chronic interstitial lung markings consistent with emphysema. Electronically Signed   By: Nelson Chimes M.D.   On: 09/08/2016 13:09    Procedures Procedures (including critical care time)  Medications Ordered in ED Medications - No data to display   Initial Impression / Assessment and Plan / ED Course  I have reviewed the triage vital signs and the nursing notes.  Pertinent labs & imaging results that were available during my care of the patient were reviewed by me and considered in my medical decision making (see chart for details).      Patient was ambulating around the emergency department and became short of breath but her O2 sat still remained high. Patient is unable to get a  CT angiogram done to rule out a PE because of her kidney function. She will get a VQ scan. And will be admitted to observation 5 medicine  Final Clinical Impressions(s) / ED Diagnoses   Final diagnoses:  SOB (shortness of breath)    New Prescriptions New Prescriptions   No medications on file     Milton Ferguson, MD 09/08/16 1550

## 2016-09-08 NOTE — ED Triage Notes (Signed)
Pt reports sob x1 week that has gotten worse especially with walking,  denies cp.  Pt had stent placed within last year and had MI in July 2017.  Pt alert and oriented.

## 2016-09-08 NOTE — ED Notes (Signed)
Pt ambulated around nurse desk in the dept with SOB, RA sats 99% and remained 97-98% while ambulating, EDP is aware.  Pt with mild SOB, pt states it was worse at home.

## 2016-09-08 NOTE — H&P (Signed)
History and Physical    Patricia Jackson JHE:[id number] DOB: [birth year] DOA: 09/08/2016  PCP: Celene Squibb, MD Consultants:  [facility] - cardiology; [facility] - pulmonology; [facility] - vascular Patient coming from: home - lives with son and daughter-in-law; Donald Prose: son, [phone]  Chief Complaint: SOB  HPI: Patricia Jackson is a 76 y.o. female with medical history significant of CAD s/p stent in 12/17, HTN, COPD not on home O2, remote breast cancer, and atrial flutter not on [facility] (discontinued after stent placement) presenting with progressive SOB over the last few days.  Difficulty with any exertion.  Went to [employer] with her grandson today and had to sit at the door and wait for him to shop.  It may have been present for the last few months but worse in the last few days.  She has had this problem twice before - once with PNA and arrhythmia and once with symptomatic anemia.  Minimal cough.  No wheezing.  No fevers.  No LE edema.  Last Wednesday to Sunday she went on a car trip to [place] - 10 hours by car, stopped every few hours.  Denies h/o blood clots.   ED Course:  SOB with ambulation in the ER without hypoxia.  Negative VQ.  Review of Systems: As per HPI; otherwise review of systems reviewed and negative.   Ambulatory Status:  Ambulates without assistance  Past Medical History:  Diagnosis Date  . Atrial flutter ([place])    On Eliquis in 8/17 but discontinued after stent placement  . Breast cancer ([place])    remote  . CAD in native artery 03/16/2016  . COPD (chronic obstructive pulmonary disease) ([place])   . Hypertension   . S/P angioplasty with stent 03/15/16 DES [place], [place] 03/16/2016    Past Surgical History:  Procedure Laterality Date  . APPENDECTOMY    . BREAST SURGERY    . CARDIAC CATHETERIZATION N/A 03/15/2016   Procedure: Left Heart Cath and Coronary Angiography;  Surgeon: Belva Crome, MD;  Location: [place] [facility];  Service: Cardiovascular;  Laterality: N/A;  . CARDIAC  CATHETERIZATION N/A 03/15/2016   Procedure: Coronary Stent Intervention;  Surgeon: Belva Crome, MD;  Location: [place] [facility];  Service: Cardiovascular;  Laterality: N/A;  . COLONOSCOPY     remote  . COLONOSCOPY N/A 05/26/2016   Procedure: COLONOSCOPY;  Surgeon: Daneil Dolin, MD;  [facility];  Service: Endoscopy;  Laterality: N/A;  845  . ESOPHAGOGASTRODUODENOSCOPY N/A 05/26/2016   Procedure: ESOPHAGOGASTRODUODENOSCOPY (EGD);  Surgeon: Daneil Dolin, MD;  [facility];  Service: Endoscopy;  Laterality: N/A;  Venia Minks DILATION N/A 05/26/2016   Procedure: Venia Minks DILATION;  Surgeon: Daneil Dolin, MD;  [facility];  Service: Endoscopy;  Laterality: N/A;    Social History   Social History  . Marital status: Divorced    Spouse name: N/A  . Number of children: N/A  . Years of education: N/A   Occupational History  . Retired    Social History Main Topics  . Smoking status: Former Smoker    Packs/day: 1.00    Years: 56.00    Types: Cigarettes    Start date: 12/15/1965    Quit date: 10/29/2015  . Smokeless tobacco: Never Used  . Alcohol use No  . Drug use: No  . Sexual activity: Not Currently   Other Topics Concern  . Not on file   Social History Narrative  . No narrative on file  Allergies  Allergen Reactions  . Sulfa Antibiotics Hives    Patient has not been administered in over 30 years     Family History  Problem Relation Age of Onset  . Heart disease Mother   . COPD Father   . Cancer Sister        unknown primary  . Cancer Brother        unknown primary  . Colon cancer Neg Hx     Prior to Admission medications   Medication Sig Start Date End Date Taking? Authorizing Provider  acetaminophen (TYLENOL) 500 MG tablet Take 500 mg by mouth every 6 (six) hours as needed for moderate pain or headache.    Yes [provider]  albuterol (PROVENTIL) (2.5 MG/3ML) 0.083% nebulizer solution Take 3 mLs (2.5 mg total) by  nebulization every 6 (six) hours as needed for wheezing or shortness of breath. 11/08/15  Yes Almyra Deforest, PA  amiodarone (PACERONE) 200 MG tablet Take 1 tablet (200 mg total) by mouth daily. 05/18/16  Yes Lendon Colonel, NP  amLODipine (NORVASC) 5 MG tablet Take 1 tablet (5 mg total) by mouth daily. 03/16/16  Yes Isaiah Serge, NP  buPROPion (WELLBUTRIN XL) 300 MG 24 hr tablet Take 300 mg by mouth daily.  10/10/15  Yes [provider]  clopidogrel (PLAVIX) 75 MG tablet Take 1 tablet (75 mg total) by mouth daily. 04/06/16  Yes Lendon Colonel, NP  diphenhydrAMINE (BENADRYL) 25 MG tablet Take 50 mg by mouth at bedtime as needed for sleep.   Yes [provider]  ferrous sulfate (FERROUSUL) 325 (65 FE) MG tablet Take 1 tablet (325 mg total) by mouth daily with breakfast. 04/08/16  Yes Rosita Fire, MD  levocetirizine (XYZAL) 5 MG tablet Take 5 mg by mouth at bedtime.    Yes [provider]  metoprolol succinate (TOPROL-XL) 25 MG 24 hr tablet Take 1 tablet (25 mg total) by mouth daily. Take with or immediately following a meal. 04/20/16 09/08/16 Yes Lendon Colonel, NP  nitroGLYCERIN (NITROSTAT) 0.4 MG SL tablet Place 1 tablet (0.4 mg total) under the tongue once. Patient taking differently: Place 0.4 mg under the tongue every 5 (five) minutes as needed.  03/16/16 09/08/16 Yes Isaiah Serge, NP  pantoprazole (PROTONIX) 20 MG tablet Take 1 tablet by mouth daily. 07/21/16  Yes [provider]  rosuvastatin (CRESTOR) 20 MG tablet Take 1 tablet by mouth daily. 08/13/16  Yes [provider]  SYMBICORT 160-4.5 MCG/ACT inhaler Inhale 1 puff into the lungs daily. Patient taking differently: Inhale 2 puffs into the lungs 2 (two) times daily.  02/16/16  Yes Mannam, Praveen, MD  Tiotropium Bromide Monohydrate (SPIRIVA RESPIMAT) 2.5 MCG/ACT AERS Inhale 2 puffs into the lungs daily. 06/02/16  Yes Mannam, Praveen, MD  traMADol (ULTRAM) 50 MG tablet Take 50 mg by  mouth 4 (four) times daily as needed.  08/13/16  Yes [provider]    Physical Exam: Vitals:   09/08/16 1745 09/08/16 1747 09/08/16 1749 09/08/16 1840  BP:  124/65  134/61  Pulse: (!) 47  (!) 50 (!) 49  Resp: (!) 25  15 (!) 22  Temp:    98.1 F (36.7 C)  TempSrc:    Oral  SpO2: 97% 96% 98% 98%  Weight:      Height:         General:  Appears calm and comfortable and is NAD Eyes:  PERRL, EOMI, normal lids, iris ENT:  grossly normal  hearing, lips & tongue, mmm Neck:  no LAD, masses or thyromegaly Cardiovascular:  RR with bradycardia, no m/r/g. No LE edema.  Respiratory:  CTA bilaterally, no w/r/r. Normal respiratory effort. Abdomen:  soft, ntnd, NABS Skin:  no rash or induration seen on limited exam Musculoskeletal:  grossly normal tone BUE/BLE, good ROM, no bony abnormality Psychiatric:  grossly normal mood and affect, speech fluent and appropriate, AOx3 Neurologic:  CN 2-12 grossly intact, moves all extremities in coordinated fashion, sensation intact  Labs on Admission: I have personally reviewed following labs and imaging studies  CBC:  Recent Labs Lab 09/08/16 1248  WBC 6.1  NEUTROABS 4.5  HGB 12.0  HCT 37.1  MCV 87.7  PLT 732   Basic Metabolic Panel:  Recent Labs Lab 09/08/16 1248  NA 137  K 4.1  CL 107  CO2 23  GLUCOSE 137*  BUN 26*  CREATININE 1.76*  CALCIUM 8.9   GFR: Estimated Creatinine Clearance: 27.7 mL/min (A) (by C-G formula based on SCr of 1.76 mg/dL (H)). Liver Function Tests:  Recent Labs Lab 09/08/16 1248  AST 60*  ALT 54  ALKPHOS 83  BILITOT 0.4  PROT 7.2  ALBUMIN 3.5   No results for input(s): LIPASE, AMYLASE in the last 168 hours. No results for input(s): AMMONIA in the last 168 hours. Coagulation Profile: No results for input(s): INR, PROTIME in the last 168 hours. Cardiac Enzymes:  Recent Labs Lab 09/08/16 1248  TROPONINI <0.03   BNP (last 3 results) No results for input(s): PROBNP in the last 8760  hours. HbA1C: No results for input(s): HGBA1C in the last 72 hours. CBG: No results for input(s): GLUCAP in the last 168 hours. Lipid Profile: No results for input(s): CHOL, HDL, LDLCALC, TRIG, CHOLHDL, LDLDIRECT in the last 72 hours. Thyroid Function Tests: No results for input(s): TSH, T4TOTAL, FREET4, T3FREE, THYROIDAB in the last 72 hours. Anemia Panel: No results for input(s): VITAMINB12, FOLATE, FERRITIN, TIBC, IRON, RETICCTPCT in the last 72 hours. Urine analysis:    Component Value Date/Time   COLORURINE YELLOW 04/07/2016 1353   APPEARANCEUR CLEAR 04/07/2016 1353   LABSPEC 1.017 04/07/2016 1353   PHURINE 5.0 04/07/2016 1353   GLUCOSEU NEGATIVE 04/07/2016 1353   HGBUR NEGATIVE 04/07/2016 1353   BILIRUBINUR NEGATIVE 04/07/2016 1353   KETONESUR NEGATIVE 04/07/2016 1353   PROTEINUR NEGATIVE 04/07/2016 1353   NITRITE NEGATIVE 04/07/2016 1353   LEUKOCYTESUR SMALL (A) 04/07/2016 1353    Creatinine Clearance: Estimated Creatinine Clearance: 27.7 mL/min (A) (by C-G formula based on SCr of 1.76 mg/dL (H)).  Sepsis Labs: @LABRCNTIP (procalcitonin:4,lacticidven:4) )No results found for this or any previous visit (from the past 240 hour(s)).   Radiological Exams on Admission: Nm Pulmonary Perf And Vent  Result Date: 09/08/2016 CLINICAL DATA:  Chronic shortness of breath.  Worsening symptoms. EXAM: NUCLEAR MEDICINE VENTILATION - PERFUSION LUNG SCAN TECHNIQUE: Ventilation images were obtained in multiple projections using inhaled aerosol Tc-45mDTPA. Perfusion images were obtained in multiple projections after intravenous injection of Tc-967mAA. RADIOPHARMACEUTICALS:  33.0 mCi Technetium-9933mPA aerosol inhalation and 4.4 mCi Technetium-32m13m IV COMPARISON:  Chest x-ray 09/08/2016.  Chest CT 07/03/2016. FINDINGS: Severe bilateral multifocal ventilatory defects. No significant perfusion mismatches. Ventilatory defects are much more prominent in the knee small perfusion defects.  Findings consistent with COPD. IMPRESSION: COPD.  Low probability pulmonary embolus. Electronically Signed   By: ThomMarcello Mooresgister   On: 09/08/2016 16:43   Dg Chest Portable 1 View  Result Date: 09/08/2016 CLINICAL DATA:  Shortness of  breath over the last week, worse with walking. EXAM: PORTABLE CHEST 1 VIEW COMPARISON:  04/03/2016 FINDINGS: Heart size is normal. Aortic atherosclerosis. Chronic pulmonary hyperinflation with chronic interstitial lung markings. No sign of active infiltrate, mass, effusion or collapse. No pulmonary edema. No acute bone finding. Previous left mastectomy. IMPRESSION: No active disease. Chronic pulmonary hyperinflation and chronic interstitial lung markings consistent with emphysema. Electronically Signed   By: Nelson Chimes M.D.   On: 09/08/2016 13:09    EKG: not done  Assessment/Plan Principal Problem:   Shortness of breath Active Problems:   Hypertension   AKI (acute kidney injury) ([place])   Hyperglycemia   Pertinent labs:  Glucose 137 BUN 26/Creatinine 1.76/GFR 28; 39/1.48/34 on 04/08/16 BNP 143, prior 177 on 04/03/16 Troponin <0.03 D-dimer 4.49   SOB -Uncertain etiology -Patient does have known COPD, but does not appear to be wheezing significantly and does not have hypoxia.  Will continue Spiriva and Symbicort and give prn albuterol. -Patient with positive D-dimer but negative VQ scan.  She does have travel history, but LE are not concerning for DVT.  Unlikely current PE. -She has a recent h/o CAD with stent placement, but troponin is negative, BNP is not elevated compared to prior so ACS/CHF seem unlikely. -She has a h/o aflutter and could have PAF causing symptoms.  She is currently very well (overly?) rate controlled, though.  She is also not on AC at this time. -Bradycardia noted, could be the cause of her symptoms.  Will hold Toprol and follow to see if this improves her symptoms. -No apparent infectious etiology for now. -Amiodarone can cause  pulmonary symptoms but there is nothing to suggest this on CXR at this time. -Will observe overnight on telemetry.  AKI -Mildly increased creatinine from prior baseline. -Will rehydrate and follow. -May be an incidental finding.  HTN -Continue Norvasc, hold Toprol.  Hyperglycemia -May be stress response -Will follow with fasting AM labs -If still elevated, consider A1c.   DVT prophylaxis: Lovenox  Code Status: DNR - confirmed with patient Family Communication: None present Disposition Plan:  Home once clinically improved Consults called: None  Admission status: It is my clinical opinion that referral for OBSERVATION is reasonable and necessary in this patient based on the above information provided. The aforementioned taken together are felt to place the patient at high risk for further clinical deterioration. However it is anticipated that the patient may be medically stable for discharge from the hospital within 24 to 48 hours.    Karmen Bongo MD [facility]  If 7PM-7AM, please contact night-coverage [email or web address] Password [email or web address]  09/08/2016, 9:24 PM

## 2016-09-08 NOTE — Progress Notes (Signed)
RN spoke with pharmacist about pts Xyzal, Pharmacist stated the hospital does not carry this medication. When she tried to substitute for Claritin, there was a warning about a prolonged T wave d/t pts Amiodarone. Pt made aware. Pt verbalized understanding.

## 2016-09-08 NOTE — Plan of Care (Signed)
Problem: Respiratory: Goal: Levels of oxygenation will improve Outcome: Progressing Pt admitted with shortness of breath. Pt currently on Room air with oxygen saturations 97%. Pt does c/o dyspnea with exertion, but get up to BR without any trouble. Pt can have Albuterol PRN, she hasn't needed any at this time. Will continue to monitor pt

## 2016-09-08 NOTE — ED Notes (Signed)
Patient transported to X-ray 

## 2016-09-08 NOTE — ED Notes (Signed)
Pt returned from [place] 

## 2016-09-08 NOTE — ED Notes (Signed)
EKG given to Dr. Zammit 

## 2016-09-09 ENCOUNTER — Observation Stay (HOSPITAL_COMMUNITY): Payer: Medicare Other

## 2016-09-09 DIAGNOSIS — N179 Acute kidney failure, unspecified: Secondary | ICD-10-CM

## 2016-09-09 DIAGNOSIS — R001 Bradycardia, unspecified: Secondary | ICD-10-CM | POA: Diagnosis not present

## 2016-09-09 DIAGNOSIS — J449 Chronic obstructive pulmonary disease, unspecified: Secondary | ICD-10-CM

## 2016-09-09 DIAGNOSIS — M6281 Muscle weakness (generalized): Secondary | ICD-10-CM | POA: Diagnosis not present

## 2016-09-09 DIAGNOSIS — R0602 Shortness of breath: Secondary | ICD-10-CM | POA: Diagnosis not present

## 2016-09-09 DIAGNOSIS — R0600 Dyspnea, unspecified: Secondary | ICD-10-CM | POA: Diagnosis not present

## 2016-09-09 DIAGNOSIS — J441 Chronic obstructive pulmonary disease with (acute) exacerbation: Secondary | ICD-10-CM | POA: Diagnosis not present

## 2016-09-09 LAB — BASIC METABOLIC PANEL
Anion gap: 13 (ref 5–15)
Anion gap: 7 (ref 5–15)
BUN: 28 mg/dL — ABNORMAL HIGH (ref 6–20)
BUN: 25 mg/dL — ABNORMAL HIGH (ref 6–20)
CO2: 16 mmol/L — ABNORMAL LOW (ref 22–32)
CO2: 22 mmol/L (ref 22–32)
Calcium: 9 mg/dL (ref 8.9–10.3)
Calcium: 8.8 mg/dL — ABNORMAL LOW (ref 8.9–10.3)
Chloride: 109 mmol/L (ref 101–111)
Chloride: 108 mmol/L (ref 101–111)
Creatinine, Ser: 1.47 mg/dL — ABNORMAL HIGH (ref 0.44–1.00)
Creatinine, Ser: 1.49 mg/dL — ABNORMAL HIGH (ref 0.44–1.00)
GFR calc Af Amer: 40 mL/min — ABNORMAL LOW (ref >60)
GFR calc Af Amer: 39 mL/min — ABNORMAL LOW (ref >60)
GFR calc non Af Amer: 34 mL/min — ABNORMAL LOW (ref >60)
GFR calc non Af Amer: 34 mL/min — ABNORMAL LOW (ref >60)
Glucose, Bld: 91 mg/dL (ref 65–99)
Glucose, Bld: 110 mg/dL — ABNORMAL HIGH (ref 65–99)
Potassium: 4.1 mmol/L (ref 3.5–5.1)
Potassium: 3.8 mmol/L (ref 3.5–5.1)
Sodium: 138 mmol/L (ref 135–145)
Sodium: 137 mmol/L (ref 135–145)

## 2016-09-09 LAB — CBC
HCT: 35.8 % — ABNORMAL LOW (ref 36.0–46.0)
Hemoglobin: 11.5 g/dL — ABNORMAL LOW (ref 12.0–15.0)
MCH: 28.3 pg (ref 26.0–34.0)
MCHC: 32.1 g/dL (ref 30.0–36.0)
MCV: 88.2 fL (ref 78.0–100.0)
Platelets: 252 10*3/uL (ref 150–400)
RBC: 4.06 MIL/uL (ref 3.87–5.11)
RDW: 14.9 % (ref 11.5–15.5)
WBC: 6 10*3/uL (ref 4.0–10.5)

## 2016-09-09 LAB — GLUCOSE, CAPILLARY: Glucose-Capillary: 133 mg/dL — ABNORMAL HIGH (ref 65–99)

## 2016-09-09 LAB — TROPONIN I: Troponin I: <0.03 ng/mL (ref <0.03)

## 2016-09-09 MED ORDER — FLUTICASONE FUROATE-VILANTEROL 100-25 MCG/INH IN AEPB
1.0000 | INHALATION_SPRAY | Freq: Every day | RESPIRATORY_TRACT | Status: DC
  Administered 2016-09-09: 11:00:00 1 via RESPIRATORY_TRACT
  Filled 2016-09-09: qty 28

## 2016-09-09 MED ORDER — PANTOPRAZOLE SODIUM 40 MG PO TBEC
40.0000 mg | DELAYED_RELEASE_TABLET | Freq: Every day | ORAL | Status: DC
  Administered 2016-09-09: 40 mg via ORAL
  Filled 2016-09-09: qty 1

## 2016-09-09 MED ORDER — ENOXAPARIN SODIUM 40 MG/0.4ML ~~LOC~~ SOLN
40.0000 mg | SUBCUTANEOUS | Status: DC
Start: 2016-09-09 — End: 2016-09-09

## 2016-09-09 MED ORDER — UMECLIDINIUM BROMIDE 62.5 MCG/INH IN AEPB
1.0000 | INHALATION_SPRAY | Freq: Every day | RESPIRATORY_TRACT | Status: DC
  Administered 2016-09-09: 11:00:00 1 via RESPIRATORY_TRACT
  Filled 2016-09-09: qty 7

## 2016-09-09 MED ORDER — PNEUMOCOCCAL VAC POLYVALENT 25 MCG/0.5ML IJ INJ: 0.5000 mL | INJECTION | INTRAMUSCULAR | Status: DC

## 2016-09-09 MED ORDER — ENSURE ENLIVE PO LIQD
237.0000 mL | Freq: Two times a day (BID) | ORAL | Status: DC
  Administered 2016-09-09: 237 mL via ORAL

## 2016-09-09 NOTE — Care Management (Signed)
    Durable Medical Equipment        Start     Ordered   09/09/16 1345  For home use only DME Walker rolling  Once    Question:  Patient needs a walker to treat with the following condition  Answer:  Unsteady gait   09/09/16 1344

## 2016-09-09 NOTE — Progress Notes (Signed)
Pt discharged home today per Dr. Goodrich. Pt's IV site D/C'd and WDL. Pt's VSS. Pt provided with home medication list, discharge instructions and prescriptions. Verbalized understanding. Pt left floor via WC in stable condition accompanied by NT. 

## 2016-09-09 NOTE — Progress Notes (Signed)
Initial Nutrition Assessment    INTERVENTION:  Ensure Enlive po BID, each supplement provides 350 kcal and 20 grams of protein    NUTRITION DIAGNOSIS:   Increased nutrient needs related to acute illness (shortness of breath) /Chronic COPD as evidenced by estimated needs.  GOAL:   Patient will meet greater than or equal to 90% of their needs   MONITOR:   PO intake, Labs, Weight trends  REASON FOR ASSESSMENT:   Consult COPD Protocol  ASSESSMENT:  Patricia Jackson is from home. She has a hx of COPD, CAD, breast cancer and HTN. She presents with c/o shortness of breath.The pt also c/o of chronic constipation and is on a bowel regimen at home.  Diet hx: 2 slices toast for breakfast and dinner at night but most often skips lunch. Meal pattern is not new and she denies changes in appetite. She has not eaten as well the past few days due to shortness of breath. Today during RD visit she is eating her lunch of cheeseburger and french fries and drinking a regular coke. She drinks Pepsi products at home.   Weight hx: usual wt reported as 130-135#. Patricia Jackson says her wt dipped back in February but quickly rebounded when she received a transfusion. She also had a esophageal dilation around the same time. Her diet is regular which is what she eats at home as well. She denies current swallow problems and has good dentition.   Nutrition-Focused physical exam findings are no fat depletion, mild muscle depletion, and no edema. She is at risk for malnutrition but currently does not meet the criteria.      Recent Labs Lab 09/08/16 1248 09/09/16 0414 09/09/16 1042  NA 137 138 137  K 4.1 4.1 3.8  CL 107 109 108  CO2 23 16* 22  BUN 26* 28* 25*  CREATININE 1.76* 1.47* 1.49*  CALCIUM 8.9 9.0 8.8*  GLUCOSE 137* 91 110*   Labs Cr 1.49, BUN 25  Meds: Protonix  Diet Order:  Diet regular Room service appropriate? Yes; Fluid consistency: Thin  Skin:  Reviewed, no issues  Last BM:  6/5 hx of  constipation and takes a laxitive at home  Height:   Ht Readings from Last 1 Encounters:  09/08/16 5\' 7"  (1.702 m)    Weight:   Wt Readings from Last 1 Encounters:  09/08/16 137 lb (62.1 kg)    Ideal Body Weight:  61 kg  BMI:  Body mass index is 21.46 kg/m.  Estimated Nutritional Needs:   Kcal:  1700-1900  Protein:  75-86 gr  Fluid:  1.7-1.8 liters daily  EDUCATION NEEDS:   No education needs identified at this time  Colman Cater Patricia,RD,CSG,LDN Office: #[phone] Pager: [phone]

## 2016-09-09 NOTE — Progress Notes (Addendum)
PROGRESS NOTE  Patricia Jackson FUX:[id number] DOB: 1940/05/20 DOA: 09/08/2016 PCP: Celene Squibb, MD  [facility] - cardiology; [facility] - pulmonology; [facility] - vascular  Brief Narrative: 76 year old woman PMH non-ST elevation MI, status post stent placement as well as COPD not on home oxygen presented with progressive shortness of breath for several days. 2 episodes in past, one related to pneumonia and the other symptomatic anemia. Recently travel to [place] on a long car trip. Admitted for shortness of breath of unclear etiology.  Assessment/Plan #1: COPD with progressive shortness of breath over the last several days. D-dimer positive but VQ scan low risk. Bilateral lower extremity venous Doppler negative for DVT. Chest x-ray no acute disease. Pulmonology recommended high-resolution chest CT for further evaluation and has adjusted inhaler regimen. CT can be pursued as outpatient. In fact the patient has follow-up with her pulmonologist already arranged 6/80. Will defer to her pulmonologist. Will continue her inhalers as at admission and defer changes to her outpatient pulmonary physician.   #2: Acute kidney injury superimposed on chronic kidney disease stage III. Creatinine now at baseline.  3: Sinus bradycardia, metoprolol held in hospial. Amiodarone continue at this point.  PMH NSTEMI, s/p DES. Initially treated with aspirin and Brilinta, but patient reported shortness of breath and there was some question whether this was related to Brilinta and thus patient was switched to Plavix.  PMH LA grade A esophagitis.   Discussed with pulmonology. Plan discharge home today. She will keep her outpatient appointment with her pulmonologist already arranged for 6/8. I will communicate with her cardiologist regard to bradycardia which is not clearly symptomatic and whether she should continue Toprol or not.  DVT prophylaxis: SCDs Code Status: DNR Family Communication:  none Disposition Plan: Home health PT    Murray Hodgkins, MD  [facility] Direct contact: [phone] --Via amion app OR  --[email or web address]; password [email or web address]  7PM-7AM contact night coverage as above 09/09/2016, 3:29 PM  LOS: 0 days   Consultants:  Pulmonology  Procedures:    Antimicrobials:    Interval history/Subjective: Still short of breath, not much different compared to yesterday. Worse when lying down.  Objective: Vitals: Afebrile, temperature 98.3, pulse 49, blood pressure 114/49. SPO2 98% on room air.  Exam:     Constitutional: Appears calm, mildly uncomfortable.  Respiratory: Fair air movement. Bilateral posterior crackles. No frank wheezes, rales. Mild increased respiratory effort. Does become short of breath when talking.  Cardiovascular: Regular rate and rhythm. No murmur, rub or gallop.  Psychiatric: Grossly normal mood and affect. Speech fluent and appropriate.   I have personally reviewed the following:   Labs:  Basic metabolic panel unremarkable. Creatinine has returned to baseline, 1.49. Potassium within normal limits.  Modest elevation of AST has been seen in the past. Clinically insignificant.  Troponins negative. BNP unremarkable.  CBC unremarkable. Hemoglobin 11.5.  Imaging studies:  Bilateral lower extremities venous Doppler negative for DVT.  VQ scan COPD. Low probability for PE.  Chest x-ray no acute disease  Medical tests:  Sinus bradycardia.  Test discussed with performing physician:    Decision to obtain old records:    Review and summation of old records:    Scheduled Meds: . amiodarone  200 mg Oral Daily  . amLODipine  5 mg Oral Daily  . buPROPion  300 mg Oral Daily  . clopidogrel  75 mg Oral Daily  . docusate sodium  100 mg Oral BID  . enoxaparin (LOVENOX) injection  40 mg Subcutaneous Q24H  .  feeding supplement (ENSURE ENLIVE)  237 mL Oral BID BM  . ferrous sulfate  325 mg Oral Q breakfast  .  fluticasone furoate-vilanterol  1 puff Inhalation Daily  . pantoprazole  40 mg Oral Daily  . [START ON 09/10/2016] pneumococcal 23 valent vaccine  0.5 mL Intramuscular Tomorrow-1000  . rosuvastatin  20 mg Oral Daily  . umeclidinium bromide  1 puff Inhalation Daily   Continuous Infusions: . lactated ringers 75 mL/hr at 09/09/16 0532    Principal Problem:   Shortness of breath Active Problems:   Hypertension   AKI (acute kidney injury) ([place])   Hyperglycemia   LOS: 0 days

## 2016-09-09 NOTE — Care Management Obs Status (Signed)
[place] NOTIFICATION   Patient Details  Name: Yuko Coventry MRN: [id number] Date of Birth: 06-13-1940   Medicare Observation Status Notification Given:  Yes    Keysha Damewood, Chauncey Reading, RN 09/09/2016, 11:13 AM

## 2016-09-09 NOTE — Consult Note (Signed)
Consult requested by: Dr. Sarajane Jews Consult requested for: Respiratory failure/COPD exacerbation  HPI: This is a 76 year old who has a significant history of COPD at baseline and who has had increasing symptoms for the last several days. She also has coronary disease and although she had some shortness of breath with that this does not feel like what she has when she has cardiac chest pain. She had stenting in December. She has been much more short of breath as mentioned. She has had minimal cough. She's not been wheezing much. She's not had a definite fever. She did make a long car trip prior to her episode. She had ventilation/perfusion lung scan done yesterday that was consistent with COPD. She is significantly more short of breath than usual. She has previous history of hypertension COPD coronary disease and atrial flutter. Family history of COPD. She is an ex cigarette smoker. She denies hemoptysis nausea vomiting diarrhea pleurisy or swelling of her legs. Past Medical History:  Diagnosis Date  . Atrial flutter ([place])    On Eliquis in 8/17 but discontinued after stent placement  . Breast cancer ([place])    remote  . CAD in native artery 03/16/2016  . COPD (chronic obstructive pulmonary disease) ([place])   . Hypertension   . S/P angioplasty with stent 03/15/16 DES Resolute, pLCX 03/16/2016     Family History  Problem Relation Age of Onset  . Heart disease Mother   . COPD Father   . Cancer Sister        unknown primary  . Cancer Brother        unknown primary  . Colon cancer Neg Hx      Social History   Social History  . Marital status: Divorced    Spouse name: N/A  . Number of children: N/A  . Years of education: N/A   Occupational History  . Retired    Social History Main Topics  . Smoking status: Former Smoker    Packs/day: 1.00    Years: 56.00    Types: Cigarettes    Start date: 12/15/1965    Quit date: 10/29/2015  . Smokeless tobacco: Never Used  . Alcohol use No  .  Drug use: No  . Sexual activity: Not Currently   Other Topics Concern  . None   Social History Narrative  . None     ROS: Except as mentioned 10 point review of systems is negative    Objective: Vital signs in last 24 hours: Temp:  [97.6 F (36.4 C)-98.3 F (36.8 C)] 98.3 F (36.8 C) (06/07 0450) Pulse Rate:  [45-52] 45 (06/06 2124) Resp:  [11-25] 18 (06/07 0450) BP: (109-136)/(46-83) 114/49 (06/07 0450) SpO2:  [96 %-99 %] 97 % (06/07 0450) Weight:  [62.1 kg (137 lb)] 62.1 kg (137 lb) (06/06 1251) Weight change:  Last BM Date: 09/07/16  Intake/Output from previous day: 06/06 0701 - 06/07 0700 In: 1026.3 [P.O.:400; I.V.:626.3] Out: 400 [Urine:400]  PHYSICAL EXAM Constitutional: She is awake and alert and in no acute distress. She does look short of breath. She is wearing nasal oxygen. Eyes: Pupils react. EOMI. Ears nose mouth and throat: Her mucous membranes are moist. She is somewhat hard of hearing. Her throat is clear. Cardiovascular: Her heart is regular with normal heart sounds. No edema. Respiratory: Her respiratory effort is increased. Her lungs show bilateral wheezing. Gastrointestinal: Her abdomen is soft with no masses. Skin: Warm and dry. Musculoskeletal: Normal strength. Neurological: No focal abnormalities. Psychiatric: Normal mood and affect  Lab Results: Basic Metabolic Panel:  Recent Labs  09/08/16 1248  NA 137  K 4.1  CL 107  CO2 23  GLUCOSE 137*  BUN 26*  CREATININE 1.76*  CALCIUM 8.9   Liver Function Tests:  Recent Labs  09/08/16 1248  AST 60*  ALT 54  ALKPHOS 83  BILITOT 0.4  PROT 7.2  ALBUMIN 3.5   No results for input(s): LIPASE, AMYLASE in the last 72 hours. No results for input(s): AMMONIA in the last 72 hours. CBC:  Recent Labs  09/08/16 1248 09/09/16 0414  WBC 6.1 6.0  NEUTROABS 4.5  --   HGB 12.0 11.5*  HCT 37.1 35.8*  MCV 87.7 88.2  PLT 263 252   Cardiac Enzymes:  Recent Labs  09/08/16 1248  TROPONINI <0.03    BNP: No results for input(s): PROBNP in the last 72 hours. D-Dimer:  Recent Labs  09/08/16 1248  DDIMER 1.12*   CBG: No results for input(s): GLUCAP in the last 72 hours. Hemoglobin A1C: No results for input(s): HGBA1C in the last 72 hours. Fasting Lipid Panel: No results for input(s): CHOL, HDL, LDLCALC, TRIG, CHOLHDL, LDLDIRECT in the last 72 hours. Thyroid Function Tests: No results for input(s): TSH, T4TOTAL, FREET4, T3FREE, THYROIDAB in the last 72 hours. Anemia Panel: No results for input(s): VITAMINB12, FOLATE, FERRITIN, TIBC, IRON, RETICCTPCT in the last 72 hours. Coagulation: No results for input(s): LABPROT, INR in the last 72 hours. Urine Drug Screen: Drugs of Abuse  No results found for: LABOPIA, COCAINSCRNUR, LABBENZ, AMPHETMU, THCU, LABBARB  Alcohol Level: No results for input(s): ETH in the last 72 hours. Urinalysis: No results for input(s): COLORURINE, LABSPEC, PHURINE, GLUCOSEU, HGBUR, BILIRUBINUR, KETONESUR, PROTEINUR, UROBILINOGEN, NITRITE, LEUKOCYTESUR in the last 72 hours.  Invalid input(s): APPERANCEUR Misc. Labs:   ABGS: No results for input(s): PHART, PO2ART, TCO2, HCO3 in the last 72 hours.  Invalid input(s): PCO2   MICROBIOLOGY: No results found for this or any previous visit (from the past 240 hour(s)).  Studies/Results: Nm Pulmonary Perf And Vent  Result Date: 09/08/2016 CLINICAL DATA:  Chronic shortness of breath.  Worsening symptoms. EXAM: NUCLEAR MEDICINE VENTILATION - PERFUSION LUNG SCAN TECHNIQUE: Ventilation images were obtained in multiple projections using inhaled aerosol Tc-27mDTPA. Perfusion images were obtained in multiple projections after intravenous injection of Tc-92mAA. RADIOPHARMACEUTICALS:  33.0 mCi Technetium-9941mPA aerosol inhalation and 4.4 mCi Technetium-40m23m IV COMPARISON:  Chest x-ray 09/08/2016.  Chest CT 07/03/2016. FINDINGS: Severe bilateral multifocal ventilatory defects. No significant perfusion  mismatches. Ventilatory defects are much more prominent in the knee small perfusion defects. Findings consistent with COPD. IMPRESSION: COPD.  Low probability pulmonary embolus. Electronically Signed   By: ThomMarcello Mooresgister   On: 09/08/2016 16:43   Dg Chest Portable 1 View  Result Date: 09/08/2016 CLINICAL DATA:  Shortness of breath over the last week, worse with walking. EXAM: PORTABLE CHEST 1 VIEW COMPARISON:  04/03/2016 FINDINGS: Heart size is normal. Aortic atherosclerosis. Chronic pulmonary hyperinflation with chronic interstitial lung markings. No sign of active infiltrate, mass, effusion or collapse. No pulmonary edema. No acute bone finding. Previous left mastectomy. IMPRESSION: No active disease. Chronic pulmonary hyperinflation and chronic interstitial lung markings consistent with emphysema. Electronically Signed   By: MarkNelson Chimes.   On: 09/08/2016 13:09    Medications:  Prior to Admission:  Prescriptions Prior to Admission  Medication Sig Dispense Refill Last Dose  . acetaminophen (TYLENOL) 500 MG tablet Take 500 mg by mouth every 6 (six) hours as needed  for moderate pain or headache.    Taking  . albuterol (PROVENTIL) (2.5 MG/3ML) 0.083% nebulizer solution Take 3 mLs (2.5 mg total) by nebulization every 6 (six) hours as needed for wheezing or shortness of breath. 75 mL 12 09/08/2016 at 0930  . amiodarone (PACERONE) 200 MG tablet Take 1 tablet (200 mg total) by mouth daily. 30 tablet 11 09/08/2016 at 0800  . amLODipine (NORVASC) 5 MG tablet Take 1 tablet (5 mg total) by mouth daily. 30 tablet 6 09/08/2016 at 0800  . buPROPion (WELLBUTRIN XL) 300 MG 24 hr tablet Take 300 mg by mouth daily.    09/08/2016 at 0800  . clopidogrel (PLAVIX) 75 MG tablet Take 1 tablet (75 mg total) by mouth daily. 90 tablet 3 09/08/2016 at 0800  . diphenhydrAMINE (BENADRYL) 25 MG tablet Take 50 mg by mouth at bedtime as needed for sleep.   Taking  . ferrous sulfate (FERROUSUL) 325 (65 FE) MG tablet Take 1 tablet (325  mg total) by mouth daily with breakfast. 30 tablet 0 09/08/2016 at 0800  . levocetirizine (XYZAL) 5 MG tablet Take 5 mg by mouth at bedtime.    09/07/2016 at Unknown time  . metoprolol succinate (TOPROL-XL) 25 MG 24 hr tablet Take 1 tablet (25 mg total) by mouth daily. Take with or immediately following a meal. 90 tablet 3 09/08/2016 at 0800  . nitroGLYCERIN (NITROSTAT) 0.4 MG SL tablet Place 1 tablet (0.4 mg total) under the tongue once. (Patient taking differently: Place 0.4 mg under the tongue every 5 (five) minutes as needed. ) 25 tablet 4 Taking  . pantoprazole (PROTONIX) 20 MG tablet Take 1 tablet by mouth daily.   09/08/2016 at 0800  . rosuvastatin (CRESTOR) 20 MG tablet Take 1 tablet by mouth daily.   09/08/2016 at 0800  . SYMBICORT 160-4.5 MCG/ACT inhaler Inhale 1 puff into the lungs daily. (Patient taking differently: Inhale 2 puffs into the lungs 2 (two) times daily. ) 1 Inhaler 5 09/08/2016 at 0800  . Tiotropium Bromide Monohydrate (SPIRIVA RESPIMAT) 2.5 MCG/ACT AERS Inhale 2 puffs into the lungs daily. 1 Inhaler 5 Past Month at Unknown time  . traMADol (ULTRAM) 50 MG tablet Take 50 mg by mouth 4 (four) times daily as needed.    09/07/2016 at Unknown time   Scheduled: . amiodarone  200 mg Oral Daily  . amLODipine  5 mg Oral Daily  . buPROPion  300 mg Oral Daily  . clopidogrel  75 mg Oral Daily  . docusate sodium  100 mg Oral BID  . enoxaparin (LOVENOX) injection  30 mg Subcutaneous Q24H  . ferrous sulfate  325 mg Oral Q breakfast  . fluticasone furoate-vilanterol  1 puff Inhalation Daily  . pantoprazole  40 mg Oral Daily  . pneumococcal 23 valent vaccine  0.5 mL Intramuscular Tomorrow-1000  . rosuvastatin  20 mg Oral Daily  . umeclidinium bromide  1 puff Inhalation Daily   Continuous: . lactated ringers 75 mL/hr at 09/09/16 0532   RJJ:[id number] **OR** acetaminophen, albuterol, diphenhydrAMINE, ondansetron **OR** ondansetron (ZOFRAN) IV, traMADol  Assesment: She has shortness of breath  presumably from COPD exacerbation. Although her ventilation/perfusion lung scan was not suggestive of a pulmonary embolus I think we should go ahead and check venous Dopplers of her legs since she had that long car trip and this doesn't feel exactly like her normal shortness of breath. She is on amiodarone and of course could have amiodarone pulmonary toxicity so I'm going to have her do a CT chest  high resolution. Her kidney function will not allow [place] to do a CT with contrast but she doesn't need that to look for amiodarone pulmonary toxicity. She says that she cannot use her Spiriva at home so I'm going to switch her here 2 Breo and incruse and see if she can tolerate them. Principal Problem:   Shortness of breath Active Problems:   Hypertension   AKI (acute kidney injury) ([place])   Hyperglycemia    Plan: As above    LOS: 0 days   Wendle Kina L 09/09/2016, 9:10 AM

## 2016-09-09 NOTE — Evaluation (Signed)
Physical Therapy Evaluation Patient Details Name: Patricia Jackson MRN: [id number] DOB: Apr 29, 1940 Today's Date: 09/09/2016   History of Present Illness  76 yo female with onset of more dramatic SOB than her usual level, cleared for PE and DVT.  Has AKI and elevated BS on admission, PMHx:  COPD, emphysema, HTN, CAD with stent, a-flutter.  Clinical Impression  Pt was assessed initially for some dizziness complaints with being up, noting BP at rest in supine 121/49, sitting 133/58, and standing 120/67.  Will continue acute therapy as needed for strength, balance and working on gait due to her feeling of being weak with distance walking.  Will also monitor her vitals as needed although the values were all very acceptable today.  Recommend HHPT follow up to progress further as she is usually very independent with mobility with no AD.    Follow Up Recommendations Home health PT;Supervision for mobility/OOB    Equipment Recommendations  None recommended by PT (await HHPT recommendations based on her environment)    Recommendations for Other Services       Precautions / Restrictions Precautions Precautions: Fall (telemetry) Precaution Comments: pulses from 50 -58 with therapy Restrictions Weight Bearing Restrictions: No      Mobility  Bed Mobility Overal bed mobility: Needs Assistance Bed Mobility: Supine to Sit;Sit to Supine     Supine to sit: Min assist Sit to supine: Min guard   General bed mobility comments: pt used elevated HOB and rail to exit to L side of bed  Transfers Overall transfer level: Needs assistance Equipment used: 1 person hand held assist Transfers: Sit to/from Stand Sit to Stand: Min assist;Min guard         General transfer comment: minor assist to power up for BP check and to walk  Ambulation/Gait Ambulation/Gait assistance: Min guard Ambulation Distance (Feet): 120 Feet Assistive device: 1 person hand held assist (IV pole to support her effort but  not dependent) Gait Pattern/deviations: Step-through pattern;Decreased stride length;Wide base of support;Trunk flexed Gait velocity: reduced Gait velocity interpretation: Below normal speed for age/gender General Gait Details: slow pace with some decreased speed at the end of the walk due to fatigue  Stairs            Wheelchair Mobility    Modified Rankin (Stroke Patients Only)       Balance Overall balance assessment: Needs assistance Sitting-balance support: Feet supported Sitting balance-Leahy Scale: Good     Standing balance support: Single extremity supported Standing balance-Leahy Scale: Fair                               Pertinent Vitals/Pain Pain Assessment: No/denies pain    Home Living Family/patient expects to be discharged to:: Private residence Living Arrangements: Children Available Help at Discharge: Family Type of Home: House Home Access: Stairs to enter;Ramped entrance Entrance Stairs-Rails: Right   Home Layout: One level Home Equipment: None      Prior Function Level of Independence: Independent               Hand Dominance   Dominant Hand: Right    Extremity/Trunk Assessment   Upper Extremity Assessment Upper Extremity Assessment: Overall WFL for tasks assessed    Lower Extremity Assessment Lower Extremity Assessment: Generalized weakness    Cervical / Trunk Assessment Cervical / Trunk Assessment: Normal  Communication   Communication: No difficulties  Cognition Arousal/Alertness: Awake/alert Behavior During Therapy: WFL for tasks assessed/performed Overall Cognitive  Status: Within Functional Limits for tasks assessed                                        General Comments      Exercises     Assessment/Plan    PT Assessment Patient needs continued PT services  PT Problem List Decreased strength;Decreased range of motion;Decreased activity tolerance;Decreased balance;Decreased  mobility;Decreased coordination;Decreased knowledge of use of DME;Decreased safety awareness;Cardiopulmonary status limiting activity       PT Treatment Interventions DME instruction;Gait training;Functional mobility training;Therapeutic activities;Therapeutic exercise;Stair training;Balance training;Neuromuscular re-education;Patient/family education    PT Goals (Current goals can be found in the Care Plan section)  Acute Rehab PT Goals Patient Stated Goal: to get rid of SOB feeling PT Goal Formulation: With patient Time For Goal Achievement: 09/23/16 Potential to Achieve Goals: Good    Frequency Min 3X/week   Barriers to discharge Other (comment) (has family to assist her)      Co-evaluation               AM-PAC PT "6 Clicks" Daily Activity  Outcome Measure Difficulty turning over in bed (including adjusting bedclothes, sheets and blankets)?: A Little Difficulty moving from lying on back to sitting on the side of the bed? : A Little Difficulty sitting down on and standing up from a chair with arms (e.g., wheelchair, bedside commode, etc,.)?: A Little Help needed moving to and from a bed to chair (including a wheelchair)?: A Little Help needed walking in hospital room?: A Little Help needed climbing 3-5 steps with a railing? : A Little 6 Click Score: 18    End of Session Equipment Utilized During Treatment: Gait belt Activity Tolerance: Patient limited by fatigue (O2 sats good at 96-97%, pulses good 50-58, BP supported) Patient left: in bed;with call bell/phone within reach Nurse Communication: Mobility status PT Visit Diagnosis: Unsteadiness on feet (R26.81);Muscle weakness (generalized) (M62.81)    Time: 3790-2409 PT Time Calculation (min) (ACUTE ONLY): 21 min   Charges:   PT Evaluation $PT Eval Moderate Complexity: 1 Procedure     PT G Codes:   PT G-Codes **NOT FOR INPATIENT CLASS** Functional Assessment Tool Used: AM-PAC 6 Clicks Basic Mobility;Clinical  judgement Functional Limitation: Mobility: Walking and moving around Mobility: Walking and Moving Around Current Status (B3532): At least 20 percent but less than 40 percent impaired, limited or restricted Mobility: Walking and Moving Around Goal Status [id number]): At least 1 percent but less than 20 percent impaired, limited or restricted    Ramond Dial 09/09/2016, 12:52 PM   12:54 PM, 09/09/16 Mee Hives, PT, MS Physical Therapist - [place] [phone] (Office)

## 2016-09-09 NOTE — Care Management Note (Signed)
Case Management Note  Patient Details  Name: Patricia Jackson MRN: [id number] Date of Birth: Jun 23, 1940  Subjective/Objective:   Adm with SOB. From home with son. Ind PTA. No HH or DME PTA. Has PCP, family drives her to appointments, and reports no issues affording medications. Recommended for [facility] PT. Patient declines need for [facility] PT but is agreeable to having a RW. Offered choice of DME agencies.              Action/Plan: Romualdo Bolk of [facility] and will obtain order from chart. RW will be delivered to room PTA.   Expected Discharge Date:       09/10/2016           Expected Discharge Plan:  Home/Self Care  In-House Referral:     Discharge planning Services  CM Consult  Post Acute Care Choice:    Choice offered to:  Patient  DME Arranged:  [facility] Rile rolling DME Agency:  [place]:    [facility] Agency:     Status of Service:  In process, will continue to follow  If discussed at Long Length of Stay Meetings, dates discussed:    Additional Comments:  Trentyn Boisclair, Chauncey Reading, RN 09/09/2016, 1:37 PM

## 2016-09-09 NOTE — Discharge Summary (Signed)
Physician Discharge Summary  Patricia Jackson YQI:[id number] DOB: 1941-03-06 DOA: 09/08/2016  PCP: Celene Squibb, MD  Admit date: 09/08/2016 Discharge date: 09/09/2016  Recommendations for Outpatient Follow-up:  1. COPD with progressive shortness of breath  Follow-up Information    Marshell Garfinkel, MD Follow up.   Specialty:  Pulmonary Disease Why:  keep appointment 6/8 Contact information: [address] [place] [address] [phone]           Discharge Diagnoses:  1. COPD with progressive shortness of breath 2. Acute kidney injury 3. Sinus bradycardia  Discharge Condition: improved Disposition: home  Diet recommendation: heart healthy  Filed Weights   09/08/16 1251  Weight: 62.1 kg (137 lb)    History of present illness:  76 year old woman PMH non-ST elevation MI, status post stent placement as well as COPD not on home oxygen presented with progressive shortness of breath for several days. 2 episodes in past, one related to pneumonia and the other symptomatic anemia. Recently travel to [place] on a long car trip. Admitted for shortness of breath of unclear etiology.  Hospital Course:  Patient was observed overnight. Respiratory status improved with supportive care. Monitor on telemetry, noted a sinus rhythm with no arrhythmias noted. Seen by pulmonology who recommended considering changing inhalers. Also recommended high-resolution CT scan to exclude amiodarone toxicity. Given the patient's clinical improvement, and low intensity treatment regimen, she was discharged home with outpatient follow-up with her pulmonologist 6/8.  Consultant: Pulmonology  Today's assessment:See progress note same day   Discharge Instructions  Discharge Instructions    Diet - low sodium heart healthy    Complete by:  As directed    Discharge instructions    Complete by:  As directed    Call your physician or seek immediate medical attention for increased shortness of  breath, pain or worsening of condition.   Increase activity slowly    Complete by:  As directed      Allergies as of 09/09/2016      Reactions   Sulfa Antibiotics Hives   Patient has not been administered in over 30 years       Medication List    TAKE these medications   acetaminophen 500 MG tablet Commonly known as:  TYLENOL Take 500 mg by mouth every 6 (six) hours as needed for moderate pain or headache.   albuterol (2.5 MG/3ML) 0.083% nebulizer solution Commonly known as:  PROVENTIL Take 3 mLs (2.5 mg total) by nebulization every 6 (six) hours as needed for wheezing or shortness of breath.   amiodarone 200 MG tablet Commonly known as:  PACERONE Take 1 tablet (200 mg total) by mouth daily.   amLODipine 5 MG tablet Commonly known as:  NORVASC Take 1 tablet (5 mg total) by mouth daily.   buPROPion 300 MG 24 hr tablet Commonly known as:  WELLBUTRIN XL Take 300 mg by mouth daily.   clopidogrel 75 MG tablet Commonly known as:  PLAVIX Take 1 tablet (75 mg total) by mouth daily.   diphenhydrAMINE 25 MG tablet Commonly known as:  BENADRYL Take 50 mg by mouth at bedtime as needed for sleep.   ferrous sulfate 325 (65 FE) MG tablet Commonly known as:  FERROUSUL Take 1 tablet (325 mg total) by mouth daily with breakfast.   levocetirizine 5 MG tablet Commonly known as:  XYZAL Take 5 mg by mouth at bedtime.   metoprolol succinate 25 MG 24 hr tablet Commonly known as:  TOPROL-XL Take 1 tablet (  25 mg total) by mouth daily. Take with or immediately following a meal.   nitroGLYCERIN 0.4 MG SL tablet Commonly known as:  NITROSTAT Place 1 tablet (0.4 mg total) under the tongue once. What changed:  when to take this  reasons to take this   pantoprazole 20 MG tablet Commonly known as:  PROTONIX Take 1 tablet by mouth daily.   rosuvastatin 20 MG tablet Commonly known as:  CRESTOR Take 1 tablet by mouth daily.   SYMBICORT 160-4.5 MCG/ACT inhaler Generic drug:   budesonide-formoterol Inhale 1 puff into the lungs daily. What changed:  how much to take  when to take this   Tiotropium Bromide Monohydrate 2.5 MCG/ACT Aers Commonly known as:  SPIRIVA RESPIMAT Inhale 2 puffs into the lungs daily.   traMADol 50 MG tablet Commonly known as:  ULTRAM Take 50 mg by mouth 4 (four) times daily as needed.            Durable Medical Equipment        Start     Ordered   09/09/16 1345  For home use only DME Walker rolling  Once    Question:  Patient needs a walker to treat with the following condition  Answer:  Unsteady gait   09/09/16 1344     Allergies  Allergen Reactions  . Sulfa Antibiotics Hives    Patient has not been administered in over 30 years     The results of significant diagnostics from this hospitalization (including imaging, microbiology, ancillary and laboratory) are listed below for reference.    Significant Diagnostic Studies: Nm Pulmonary Perf And Vent  Result Date: 09/08/2016 CLINICAL DATA:  Chronic shortness of breath.  Worsening symptoms. EXAM: NUCLEAR MEDICINE VENTILATION - PERFUSION LUNG SCAN TECHNIQUE: Ventilation images were obtained in multiple projections using inhaled aerosol Tc-70mDTPA. Perfusion images were obtained in multiple projections after intravenous injection of Tc-973mAA. RADIOPHARMACEUTICALS:  33.0 mCi Technetium-9967mPA aerosol inhalation and 4.4 mCi Technetium-66m68m IV COMPARISON:  Chest x-ray 09/08/2016.  Chest CT 07/03/2016. FINDINGS: Severe bilateral multifocal ventilatory defects. No significant perfusion mismatches. Ventilatory defects are much more prominent in the knee small perfusion defects. Findings consistent with COPD. IMPRESSION: COPD.  Low probability pulmonary embolus. Electronically Signed   By: ThomMarcello Mooresgister   On: 09/08/2016 16:43   Us VKoreaous Img Lower Bilateral  Result Date: 09/09/2016 CLINICAL DATA:  Dyspnea worsening for 4 days. EXAM: BILATERAL LOWER EXTREMITY VENOUS DUPLEX  ULTRASOUND TECHNIQUE: Doppler venous assessment of the bilateral lower extremity deep venous system was performed, including characterization of spectral flow, compressibility, and phasicity. COMPARISON:  None. FINDINGS: There is complete compressibility of the bilateral common femoral, femoral, and popliteal veins. Doppler analysis demonstrates respiratory phasicity and augmentation of flow with calf compression. No obvious superficial vein or calf vein thrombosis. IMPRESSION: No evidence of DVT Electronically Signed   By: ArthMarybelle Killings.   On: 09/09/2016 10:11   Dg Chest Portable 1 View  Result Date: 09/08/2016 CLINICAL DATA:  Shortness of breath over the last week, worse with walking. EXAM: PORTABLE CHEST 1 VIEW COMPARISON:  04/03/2016 FINDINGS: Heart size is normal. Aortic atherosclerosis. Chronic pulmonary hyperinflation with chronic interstitial lung markings. No sign of active infiltrate, mass, effusion or collapse. No pulmonary edema. No acute bone finding. Previous left mastectomy. IMPRESSION: No active disease. Chronic pulmonary hyperinflation and chronic interstitial lung markings consistent with emphysema. Electronically Signed   By: MarkNelson Chimes.   On: 09/08/2016 13:09    Labs: Basic  Metabolic Panel:  Recent Labs Lab 09/08/16 1248 09/09/16 0414 09/09/16 1042  NA 137 138 137  K 4.1 4.1 3.8  CL 107 109 108  CO2 23 16* 22  GLUCOSE 137* 91 110*  BUN 26* 28* 25*  CREATININE 1.76* 1.47* 1.49*  CALCIUM 8.9 9.0 8.8*   Liver Function Tests:  Recent Labs Lab 09/08/16 1248  AST 60*  ALT 54  ALKPHOS 83  BILITOT 0.4  PROT 7.2  ALBUMIN 3.5   CBC:  Recent Labs Lab 09/08/16 1248 09/09/16 0414  WBC 6.1 6.0  NEUTROABS 4.5  --   HGB 12.0 11.5*  HCT 37.1 35.8*  MCV 87.7 88.2  PLT 263 252   Cardiac Enzymes:  Recent Labs Lab 09/08/16 1248 09/09/16 1042  TROPONINI <0.03 <0.03    Recent Labs  11/01/15 2330 04/03/16 1351 09/08/16 1248  BNP 375.4* 177.0* 143.0*      Principal Problem:   Shortness of breath Active Problems:   Hypertension   AKI (acute kidney injury) ([place])   Hyperglycemia   Time coordinating discharge: 35 minutes  Signed:  Murray Hodgkins, MD [facility] 09/09/2016, 4:13 PM

## 2016-09-10 ENCOUNTER — Telehealth: Payer: Self-pay | Admitting: *Deleted

## 2016-09-10 ENCOUNTER — Ambulatory Visit (INDEPENDENT_AMBULATORY_CARE_PROVIDER_SITE_OTHER): Payer: Medicare Other | Admitting: Pulmonary Disease

## 2016-09-10 ENCOUNTER — Encounter: Payer: Self-pay | Admitting: Pulmonary Disease

## 2016-09-10 VITALS — BP 116/68 | HR 67 | Ht 67.0 in | Wt 134.2 lb

## 2016-09-10 DIAGNOSIS — Z87891 Personal history of nicotine dependence: Secondary | ICD-10-CM | POA: Diagnosis not present

## 2016-09-10 DIAGNOSIS — R0600 Dyspnea, unspecified: Secondary | ICD-10-CM | POA: Diagnosis not present

## 2016-09-10 DIAGNOSIS — J849 Interstitial pulmonary disease, unspecified: Secondary | ICD-10-CM | POA: Diagnosis not present

## 2016-09-10 DIAGNOSIS — J441 Chronic obstructive pulmonary disease with (acute) exacerbation: Secondary | ICD-10-CM | POA: Diagnosis not present

## 2016-09-10 MED ORDER — UMECLIDINIUM BROMIDE 62.5 MCG/INH IN AEPB: 1.0000 | INHALATION_SPRAY | Freq: Every day | RESPIRATORY_TRACT | 5 refills | Status: AC

## 2016-09-10 MED ORDER — FLUTICASONE FUROATE-VILANTEROL 100-25 MCG/INH IN AEPB: 1.0000 | INHALATION_SPRAY | Freq: Every day | RESPIRATORY_TRACT | 5 refills | Status: AC

## 2016-09-10 NOTE — Patient Instructions (Addendum)
Continue using year inhalers including breo and incruse, We'll give a prescription for these Continue using her nebulizer. You can use it up to 4 times a day in case of worsening dyspnea  We'll order a high-resolution CT to be done at [facility] for interstitial lung disease  Return to clinic in 3 months

## 2016-09-10 NOTE — Telephone Encounter (Signed)
Pt voiced understanding - update medication list

## 2016-09-10 NOTE — Progress Notes (Signed)
Patricia Jackson    [id number]    Jun 15, 1940  Primary Care 25, Patricia Areola, MD  Referring Physician: Celene Squibb, MD [address] [place], [place] [address]  Chief complaint:  Follow-up for  COPD GOLD GOLD D  HPI: Patricia Jackson is a 76 year old with past medical history of atrial fibrillation, COPD GOLD (CAT score 24, Multiple exacerbations over past yesar). She was hospitalized in August of 2017 with atypical chest pain, COPD exacerbation. She was treated with prednisone taper. She was also noted to be in atrial flutter. She was initially treated with a Cardizem drip and then transitioned to amiodarone. The plan is to use this for short-term of about 8 weeks. She is followed by cardiology. Had another hospitalization in December 2017 with NSTEMI status post catheterization and stent placement. Also noted to have low hemoglobin of 6.5. Transfused for an deficiency anemia.  Interim History: Admitted overnight at [facility] 2 days ago for acute dyspnea. She had an evaluation including chest x-ray which showed COPD. VQ scan shows low probability for pulmonary embolism and lower extremity ultrasound was negative for DVT. She improved with the treatment and was discharged. She did not require antibiotics or prednisone. Her inhalers have been changed from Symbicort, Spiriva to breo and incruse.  Outpatient Encounter Prescriptions as of 09/10/2016  Medication Sig  . acetaminophen (TYLENOL) 500 MG tablet Take 500 mg by mouth every 6 (six) hours as needed for moderate pain or headache.   . albuterol (PROVENTIL) (2.5 MG/3ML) 0.083% nebulizer solution Take 3 mLs (2.5 mg total) by nebulization every 6 (six) hours as needed for wheezing or shortness of breath.  Marland Kitchen amiodarone (PACERONE) 200 MG tablet Take 1 tablet (200 mg total) by mouth daily.  Marland Kitchen amLODipine (NORVASC) 5 MG tablet Take 1 tablet (5 mg total) by mouth daily.  Marland Kitchen buPROPion (WELLBUTRIN XL) 300 MG 24 hr tablet Take 300  mg by mouth daily.   . clopidogrel (PLAVIX) 75 MG tablet Take 1 tablet (75 mg total) by mouth daily.  . diphenhydrAMINE (BENADRYL) 25 MG tablet Take 50 mg by mouth at bedtime as needed for sleep.  . ferrous sulfate (FERROUSUL) 325 (65 FE) MG tablet Take 1 tablet (325 mg total) by mouth daily with breakfast.  . levocetirizine (XYZAL) 5 MG tablet Take 5 mg by mouth at bedtime.   . pantoprazole (PROTONIX) 20 MG tablet Take 1 tablet by mouth daily.  . rosuvastatin (CRESTOR) 20 MG tablet Take 1 tablet by mouth daily.  . SYMBICORT 160-4.5 MCG/ACT inhaler Inhale 1 puff into the lungs daily. (Patient taking differently: Inhale 2 puffs into the lungs 2 (two) times daily. )  . Tiotropium Bromide Monohydrate (SPIRIVA RESPIMAT) 2.5 MCG/ACT AERS Inhale 2 puffs into the lungs daily.  . traMADol (ULTRAM) 50 MG tablet Take 50 mg by mouth 4 (four) times daily as needed.   . metoprolol succinate (TOPROL-XL) 25 MG 24 hr tablet Take 1 tablet (25 mg total) by mouth daily. Take with or immediately following a meal.  . nitroGLYCERIN (NITROSTAT) 0.4 MG SL tablet Place 1 tablet (0.4 mg total) under the tongue once. (Patient taking differently: Place 0.4 mg under the tongue every 5 (five) minutes as needed. )  . [DISCONTINUED] acetaminophen (TYLENOL) suppository 650 mg   . [DISCONTINUED] acetaminophen (TYLENOL) tablet 650 mg   . [DISCONTINUED] albuterol (PROVENTIL) (2.5 MG/3ML) 0.083% nebulizer solution 2.5 mg   . [DISCONTINUED] amiodarone (PACERONE) tablet 200 mg   . [  DISCONTINUED] amLODipine (NORVASC) tablet 5 mg   . [DISCONTINUED] buPROPion (WELLBUTRIN XL) 24 hr tablet 300 mg   . [DISCONTINUED] clopidogrel (PLAVIX) tablet 75 mg   . [DISCONTINUED] diphenhydrAMINE (BENADRYL) capsule 50 mg   . [DISCONTINUED] docusate sodium (COLACE) capsule 100 mg   . [DISCONTINUED] enoxaparin (LOVENOX) injection 30 mg   . [DISCONTINUED] ferrous sulfate tablet 325 mg   . [DISCONTINUED] fluticasone furoate-vilanterol (BREO ELLIPTA)  100-25 MCG/INH 1 puff   . [DISCONTINUED] lactated ringers infusion   . [DISCONTINUED] ondansetron (ZOFRAN) injection 4 mg   . [DISCONTINUED] ondansetron (ZOFRAN) tablet 4 mg   . [DISCONTINUED] pantoprazole (PROTONIX) EC tablet 40 mg   . [DISCONTINUED] pneumococcal 23 valent vaccine (PNU-IMMUNE) injection 0.5 mL   . [DISCONTINUED] rosuvastatin (CRESTOR) tablet 20 mg   . [DISCONTINUED] traMADol (ULTRAM) tablet 50 mg   . [DISCONTINUED] umeclidinium bromide (INCRUSE ELLIPTA) 62.5 MCG/INH 1 puff    No facility-administered encounter medications on file as of 09/10/2016.     Allergies as of 09/10/2016 - Review Complete 09/10/2016  Allergen Reaction Noted  . Sulfa antibiotics Hives 08/02/2013    Past Medical History:  Diagnosis Date  . Atrial flutter ([place])    On Eliquis in 8/17 but discontinued after stent placement  . Breast cancer ([place])    remote  . CAD in native artery 03/16/2016  . COPD (chronic obstructive pulmonary disease) ([place])   . Hypertension   . S/P angioplasty with stent 03/15/16 DES Res[place], [place] 03/16/2016    Past Surgical History:  Procedure Laterality Date  . APPENDECTOMY    . BREAST SURGERY    . CARDIAC CATHETERIZATION N/A 03/15/2016   Procedure: Left Heart Cath and Coronary Angiography;  Surgeon: Belva Crome, MD;  Location: [facility];  Service: Cardiovascular;  Laterality: N/A;  . CARDIAC CATHETERIZATION N/A 03/15/2016   Procedure: Coronary Stent Intervention;  Surgeon: Belva Crome, MD;  Location: [facility];  Service: Cardiovascular;  Laterality: N/A;  . COLONOSCOPY     remote  . COLONOSCOPY N/A 05/26/2016   Procedure: COLONOSCOPY;  Surgeon: Daneil Dolin, MD;  [facility];  Service: Endoscopy;  Laterality: N/A;  845  . ESOPHAGOGASTRODUODENOSCOPY N/A 05/26/2016   Procedure: ESOPHAGOGASTRODUODENOSCOPY (EGD);  Surgeon: Daneil Dolin, MD;  [facility];  Service: Endoscopy;  Laterality: N/A;  Venia Minks DILATION N/A  05/26/2016   Procedure: Venia Minks DILATION;  Surgeon: Daneil Dolin, MD;  [facility];  Service: Endoscopy;  Laterality: N/A;    Family History  Problem Relation Age of Onset  . Heart disease Mother   . COPD Father   . Cancer Sister        unknown primary  . Cancer Brother        unknown primary  . Colon cancer Neg Hx     Social History   Social History  . Marital status: Divorced    Spouse name: N/A  . Number of children: N/A  . Years of education: N/A   Occupational History  . Retired    Social History Main Topics  . Smoking status: Former Smoker    Packs/day: 1.00    Years: 56.00    Types: Cigarettes    Start date: 12/15/1965    Quit date: 10/29/2015  . Smokeless tobacco: Never Used  . Alcohol use No  . Drug use: No  . Sexual activity: Not Currently   Other Topics Concern  . Not on file   Social History  Narrative  . No narrative on file   Review of systems: Review of Systems  Constitutional: Negative for fever and chills.  HENT: Negative.   Eyes: Negative for blurred vision.  Respiratory: as per HPI  Cardiovascular: Negative for chest pain and palpitations.  Gastrointestinal: Negative for vomiting, diarrhea, blood per rectum. Genitourinary: Negative for dysuria, urgency, frequency and hematuria.  Musculoskeletal: Negative for myalgias, back pain and joint pain.  Skin: Negative for itching and rash.  Neurological: Negative for dizziness, tremors, focal weakness, seizures and loss of consciousness.  Endo/Heme/Allergies: Negative for environmental allergies.  Psychiatric/Behavioral: Negative for depression, suicidal ideas and hallucinations.  All other systems reviewed and are negative.  Physical Exam: Blood pressure 116/68, pulse 67, height 5' 7"  (1.702 m), weight 134 lb 3.2 oz (60.9 kg), SpO2 91 %. Gen:      No acute distress HEENT:  EOMI, sclera anicteric Neck:     No masses; no thyromegaly Lungs:    Clear to auscultation bilaterally; normal  respiratory effort CV:         Regular rate and rhythm; no murmurs Abd:      + bowel sounds; soft, non-tender; no palpable masses, no distension Ext:    No edema; adequate peripheral perfusion Skin:      Warm and dry; no rash Neuro: alert and oriented x 3 Psych: normal mood and affect  Data Reviewed: PFT  11/21/13: FVC 2.54 L (75%)  FEV1 1.68 L (66%)  FEV1/FVC 0.66  FEF 25-75 0.83 L (40%) no bronchodilator response  TLC 5.65 L (102%)  RV 138%  DLCO uncorrected 40% Moderate obstructive defect with severe DLCO impairment.  05/11/16 FVC 2.42 (74%) FEV1 1.6 (64%) F/F 66 TLC 84% DLCO 33%  Moderate obstructive defect with severe DLCO impairment and positive bronchodilator response  CT chest 11/26/13. Diffuse emphysematous changes Screening CT of chest 06/23/16-. Emphysematous changes, three-vessel coronary artery disease Chest x-ray 09/08/16-chronic hyperinflation, emphysematous changes VQ scan 09/08/16-low probability Lower extremity ultrasound 09/09/16-no DVT I have reviewed all images personally.  TTE 7/28:LV without regional wall motion abnormalities. LA &RA normal in size. RV normal in size and function. Pulmonary artery systolic pressure 41 mmHg. No aortic stenosis or regurgitation. No mitral stenosis but mild to moderate regurgitation directed centrally. Trivial tricuspid regurgitation. Poorly visualized pulmonic valve. No pericardial effusion.  Assessment:  COPD GOLD D She had a recent hospitalization at [facility] where inhalers were changed to [facility] and Incruse.   We'll continue the same and reassess response to therapy  She's had prior CT scans which reports pulmonary fibrosis although this does not appear to be significant on my review of the images. She is also on amiodarone which may cause interstitial lung disease. Order high-resolution CT of the chest for further evaluation  Past Smoker She continues to be tobacco free. Continue low-dose screening CTs of the  chest.  Plan/Recommendations: - Continue breo, incruse - High res CT  Return in 3 months.  Marshell Garfinkel MD [facility] Pager [phone] 09/10/2016, 11:52 AM  CC: Patricia Squibb, MD

## 2016-09-10 NOTE — Telephone Encounter (Signed)
-----   Message from Arnoldo Lenis, MD sent at 09/10/2016  2:30 PM EDT ----- Regarding: FW: fyi  Can we have this patient lower her Toprol XL to 12.5mg  daily please   Zandra Abts MD ----- Message ----- From: Samuella Cota, MD Sent: 09/09/2016   4:19 PM To: Arnoldo Lenis, MD Subject: Pryor Curia,  The patient was hospitalized for progressive COPD. She did have sinus bradycardia as low as the high 40s but not clearly symptomatic. Telemetry was unrevealing. Note I believe that she's had bradycardia in the past. I did not adjust her Toprol but would defer to you whether any change may benefit her.  Linna Hoff

## 2016-09-14 DIAGNOSIS — I10 Essential (primary) hypertension: Secondary | ICD-10-CM | POA: Diagnosis not present

## 2016-09-14 DIAGNOSIS — R944 Abnormal results of kidney function studies: Secondary | ICD-10-CM | POA: Diagnosis not present

## 2016-09-14 DIAGNOSIS — I482 Chronic atrial fibrillation: Secondary | ICD-10-CM | POA: Diagnosis not present

## 2016-09-19 ENCOUNTER — Emergency Department (HOSPITAL_COMMUNITY)
Admission: EM | Admit: 2016-09-19 | Discharge: 2016-09-19 | Disposition: A | Discharge status: Home / Self Care | Payer: Medicare Other | Attending: Emergency Medicine | Admitting: Emergency Medicine

## 2016-09-19 ENCOUNTER — Encounter (HOSPITAL_COMMUNITY): Payer: Self-pay | Admitting: Emergency Medicine

## 2016-09-19 ENCOUNTER — Emergency Department (HOSPITAL_COMMUNITY): Payer: Medicare Other

## 2016-09-19 DIAGNOSIS — Z87891 Personal history of nicotine dependence: Secondary | ICD-10-CM | POA: Insufficient documentation

## 2016-09-19 DIAGNOSIS — Z853 Personal history of malignant neoplasm of breast: Secondary | ICD-10-CM | POA: Insufficient documentation

## 2016-09-19 DIAGNOSIS — I10 Essential (primary) hypertension: Secondary | ICD-10-CM | POA: Diagnosis not present

## 2016-09-19 DIAGNOSIS — R05 Cough: Secondary | ICD-10-CM | POA: Diagnosis not present

## 2016-09-19 DIAGNOSIS — Z79899 Other long term (current) drug therapy: Secondary | ICD-10-CM | POA: Insufficient documentation

## 2016-09-19 DIAGNOSIS — I2510 Atherosclerotic heart disease of native coronary artery without angina pectoris: Secondary | ICD-10-CM | POA: Diagnosis not present

## 2016-09-19 DIAGNOSIS — J441 Chronic obstructive pulmonary disease with (acute) exacerbation: Secondary | ICD-10-CM | POA: Insufficient documentation

## 2016-09-19 DIAGNOSIS — R0602 Shortness of breath: Secondary | ICD-10-CM | POA: Diagnosis present

## 2016-09-19 LAB — CBC WITH DIFFERENTIAL/PLATELET
Basophils Absolute: 0 10*3/uL (ref 0.0–0.1)
Basophils Relative: 0 %
Eosinophils Absolute: 0.2 10*3/uL (ref 0.0–0.7)
Eosinophils Relative: 3 %
HCT: 35.4 % — ABNORMAL LOW (ref 36.0–46.0)
Hemoglobin: 11.5 g/dL — ABNORMAL LOW (ref 12.0–15.0)
Lymphocytes Relative: 23 %
Lymphs Abs: 1.8 10*3/uL (ref 0.7–4.0)
MCH: 28.6 pg (ref 26.0–34.0)
MCHC: 32.5 g/dL (ref 30.0–36.0)
MCV: 88.1 fL (ref 78.0–100.0)
Monocytes Absolute: 0.6 10*3/uL (ref 0.1–1.0)
Monocytes Relative: 7 %
Neutro Abs: 5.1 10*3/uL (ref 1.7–7.7)
Neutrophils Relative %: 66 %
Platelets: 213 10*3/uL (ref 150–400)
RBC: 4.02 MIL/uL (ref 3.87–5.11)
RDW: 15.4 % (ref 11.5–15.5)
WBC: 7.8 10*3/uL (ref 4.0–10.5)

## 2016-09-19 LAB — BASIC METABOLIC PANEL
Anion gap: 8 (ref 5–15)
BUN: 29 mg/dL — ABNORMAL HIGH (ref 6–20)
CO2: 23 mmol/L (ref 22–32)
Calcium: 9.1 mg/dL (ref 8.9–10.3)
Chloride: 113 mmol/L — ABNORMAL HIGH (ref 101–111)
Creatinine, Ser: 1.56 mg/dL — ABNORMAL HIGH (ref 0.44–1.00)
GFR calc Af Amer: 37 mL/min — ABNORMAL LOW (ref >60)
GFR calc non Af Amer: 32 mL/min — ABNORMAL LOW (ref >60)
Glucose, Bld: 79 mg/dL (ref 65–99)
Potassium: 4.5 mmol/L (ref 3.5–5.1)
Sodium: 144 mmol/L (ref 135–145)

## 2016-09-19 LAB — TROPONIN I: Troponin I: <0.03 ng/mL (ref <0.03)

## 2016-09-19 MED ORDER — LEVOFLOXACIN 500 MG PO TABS: 500.0000 mg | ORAL_TABLET | Freq: Every day | ORAL | 0 refills | Status: DC

## 2016-09-19 MED ORDER — PREDNISONE 20 MG PO TABS: 40.0000 mg | ORAL_TABLET | Freq: Every day | ORAL | 0 refills | Status: DC

## 2016-09-19 MED ORDER — ALBUTEROL SULFATE (2.5 MG/3ML) 0.083% IN NEBU
5.0000 mg | INHALATION_SOLUTION | Freq: Once | RESPIRATORY_TRACT | Status: AC
  Administered 2016-09-19: 5 mg via RESPIRATORY_TRACT
  Filled 2016-09-19: qty 6

## 2016-09-19 MED ORDER — LEVOFLOXACIN 500 MG PO TABS
500.0000 mg | ORAL_TABLET | Freq: Once | ORAL | Status: AC
  Administered 2016-09-19: 500 mg via ORAL
  Filled 2016-09-19: qty 1

## 2016-09-19 MED ORDER — PREDNISONE 20 MG PO TABS
40.0000 mg | ORAL_TABLET | Freq: Once | ORAL | Status: AC
Start: 2016-09-19 — End: 2016-09-19
  Administered 2016-09-19: 40 mg via ORAL
  Filled 2016-09-19: qty 2

## 2016-09-19 NOTE — ED Notes (Signed)
Patient transported to X-ray 

## 2016-09-19 NOTE — ED Triage Notes (Signed)
Pt reports that she has cough so much that she has lost her voice Reports increasing SOB x 3 days  Dr Nevada Crane

## 2016-09-19 NOTE — Progress Notes (Signed)
Patients son Wannetta Sender - [phone] can be contacted if needed he lives closer to hosptial .

## 2016-09-19 NOTE — ED Provider Notes (Signed)
[facility] DEPT Provider Note   CSN: [id number] Arrival date & time: 09/19/16  1728  By signing my name below, I, Reola Mosher, attest that this documentation has been prepared under the direction and in the presence of Virgel Manifold, MD. Electronically Signed: Reola Mosher, ED Scribe. 09/19/16. 6:19 PM.  History   Chief Complaint Chief Complaint  Patient presents with  . Shortness of Breath   The history is provided by the patient and medical records. No language interpreter was used.    HPI Comments: Patricia Jackson is a 76 y.o. female with a PMHx of COPD, breast cancer, CAD w/ stent placement, and prior NSTEMI, who presents to the Emergency Department complaining of persistent shortness of breath beginning yesterday, worsening throughout today. She notes that initially her symptoms began as a non-productive cough three days ago, and since yesterday she has had an acute onset of shortness of breath and hoarseness of the voice. She also notes some sore throat secondary to coughing. She is not currently on oxygen therapy at home; however, she is prescribed a nebulizer and inhalers which she has been using without significant improvement of her symptoms. Per prior chart review, pt was recent seen in the ED for same on 06/06 (~11 days ago). At that time she was admitted for her shortness of breath; however, she had CXR, VQ scan, and BLE [place] which were all negative at that time. She denies chest pain, fever, leg swelling, difficulty swallowing, or any other associated symptoms.   Past Medical History:  Diagnosis Date  . Atrial flutter ([place])    On Eliquis in 8/17 but discontinued after stent placement  . Breast cancer ([place])    remote  . CAD in native artery 03/16/2016  . COPD (chronic obstructive pulmonary disease) ([place])   . Hypertension   . S/P angioplasty with stent 03/15/16 DES Resolute, pLCX 03/16/2016   Patient Active Problem List   Diagnosis Date Noted  . AKI  (acute kidney injury) ([place]) 09/08/2016  . Hyperglycemia 09/08/2016  . Esophageal dysphagia   . Anemia 04/07/2016  . Sinus bradycardia   . Hx of heart artery stent   . CAD in native artery 03/16/2016  . S/P angioplasty with stent 03/15/16 DES Resolute, [place] 03/16/2016  . Acute coronary syndrome ([place])   . NSTEMI (non-ST elevated myocardial infarction) ([place]) 03/12/2016  . Shortness of breath   . Acute respiratory failure with hypoxia ([place])   . Hypophosphatemia   . 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[place]) 10/31/2015  . Acute exacerbation of chronic bronchitis ([place]) 10/31/2015  . COPD exacerbation ([place]) 10/31/2015  . Dyslipidemia 10/31/2015  . Elevated troponin 10/31/2015  . Atrial flutter with rapid ventricular response ([place]) 10/30/2015  . SOB (shortness of breath) 11/26/2013  . Back pain 11/26/2013  . Arm pain 11/26/2013  . Chest pain 11/26/2013  . Hypertension    Past Surgical History:  Procedure Laterality Date  . APPENDECTOMY    . BREAST SURGERY    . CARDIAC CATHETERIZATION N/A 03/15/2016   Procedure: Left Heart Cath and Coronary Angiography;  Surgeon: Belva Crome, MD;  Location: [facility];  Service: Cardiovascular;  Laterality: N/A;  . CARDIAC CATHETERIZATION N/A 03/15/2016   Procedure: Coronary Stent Intervention;  Surgeon: Belva Crome, MD;  Location: [facility];  Service: Cardiovascular;  Laterality: N/A;  . COLONOSCOPY     remote  . COLONOSCOPY N/A 05/26/2016   Procedure: COLONOSCOPY;  Surgeon: Daneil Dolin, MD;  [facility];  Service: Endoscopy;  Laterality: N/A;  845  . ESOPHAGOGASTRODUODENOSCOPY N/A 05/26/2016   Procedure: ESOPHAGOGASTRODUODENOSCOPY (EGD);  Surgeon: Daneil Dolin, MD;  [facility];  Service: Endoscopy;  Laterality: N/A;  Venia Minks DILATION N/A 05/26/2016   Procedure: Venia Minks DILATION;  Surgeon: Daneil Dolin, MD;  [facility];  Service: Endoscopy;  Laterality: N/A;   OB History    Gravida Para Term Preterm AB  Living             1   SAB TAB Ectopic Multiple Live Births                 Home Medications    Prior to Admission medications   Medication Sig Start Date End Date Taking? Authorizing Provider  acetaminophen (TYLENOL) 500 MG tablet Take 500 mg by mouth every 6 (six) hours as needed for moderate pain or headache.     [provider]  albuterol (PROVENTIL) (2.5 MG/3ML) 0.083% nebulizer solution Take 3 mLs (2.5 mg total) by nebulization every 6 (six) hours as needed for wheezing or shortness of breath. 11/08/15   Almyra Deforest, PA  amiodarone (PACERONE) 200 MG tablet Take 1 tablet (200 mg total) by mouth daily. 05/18/16   Lendon Colonel, NP  amLODipine (NORVASC) 5 MG tablet Take 1 tablet (5 mg total) by mouth daily. 03/16/16   Isaiah Serge, NP  buPROPion (WELLBUTRIN XL) 300 MG 24 hr tablet Take 300 mg by mouth daily.  10/10/15   [provider]  clopidogrel (PLAVIX) 75 MG tablet Take 1 tablet (75 mg total) by mouth daily. 04/06/16   Lendon Colonel, NP  diphenhydrAMINE (BENADRYL) 25 MG tablet Take 50 mg by mouth at bedtime as needed for sleep.    [provider]  ferrous sulfate (FERROUSUL) 325 (65 FE) MG tablet Take 1 tablet (325 mg total) by mouth daily with breakfast. 04/08/16   Rosita Fire, MD  levocetirizine (XYZAL) 5 MG tablet Take 5 mg by mouth at bedtime.     [provider]  metoprolol succinate (TOPROL-XL) 25 MG 24 hr tablet Take 12.5 mg by mouth daily.    [provider]  nitroGLYCERIN (NITROSTAT) 0.4 MG SL tablet Place 1 tablet (0.4 mg total) under the tongue once. Patient taking differently: Place 0.4 mg under the tongue every 5 (five) minutes as needed.  03/16/16 09/08/16  Isaiah Serge, NP  pantoprazole (PROTONIX) 20 MG tablet Take 1 tablet by mouth daily. 07/21/16   [provider]  rosuvastatin (CRESTOR) 20 MG tablet Take 1 tablet by mouth daily. 08/13/16   [provider]  SYMBICORT 160-4.5 MCG/ACT inhaler  Inhale 1 puff into the lungs daily. Patient taking differently: Inhale 2 puffs into the lungs 2 (two) times daily.  02/16/16   Mannam, Hart Robinsons, MD  traMADol (ULTRAM) 50 MG tablet Take 50 mg by mouth 4 (four) times daily as needed.  08/13/16   [provider]   Family History Family History  Problem Relation Age of Onset  . Heart disease Mother   . COPD Father   . Cancer Sister        unknown primary  . Cancer Brother        unknown primary  . Colon cancer Neg Hx    Social History Social History  Substance Use Topics  . Smoking status: Former Smoker    Packs/day: 1.00    Years: 56.00    Types: Cigarettes    Start date:  12/15/1965    Quit date: 10/29/2015  . Smokeless tobacco: Never Used  . Alcohol use No   Allergies   Sulfa antibiotics   Review of Systems Review of Systems  Constitutional: Negative for fever.  HENT: Positive for sore throat and voice change. Negative for trouble swallowing.   Respiratory: Positive for cough and shortness of breath.   Cardiovascular: Negative for chest pain and leg swelling.  All other systems reviewed and are negative.  Physical Exam Updated Vital Signs BP 125/67 (BP Location: Right Arm)   Pulse 61   Temp 98 F (36.7 C) (Oral)   Resp (!) 24   Ht 5' 7"  (1.702 m)   Wt 134 lb (60.8 kg)   SpO2 99%   BMI 20.99 kg/m   Physical Exam  Constitutional: She appears well-developed and well-nourished.  HENT:  Head: Normocephalic.  Right Ear: External ear normal.  Left Ear: External ear normal.  Nose: Nose normal.  Mouth/Throat: Oropharynx is clear and moist.  Eyes: Conjunctivae are normal. Right eye exhibits no discharge. Left eye exhibits no discharge.  Neck: Normal range of motion.  Cardiovascular: Normal rate, regular rhythm and normal heart sounds.   No murmur heard. Pulmonary/Chest: Effort normal. No respiratory distress. She has no wheezes. She has no rales.  Crackles heard in the left lower lobe.   Abdominal: Soft. She  exhibits no distension. There is no tenderness. There is no rebound and no guarding.  Musculoskeletal: Normal range of motion. She exhibits no edema or tenderness.  No BLE edema.   Neurological: She is alert. No cranial nerve deficit. Coordination normal.  Skin: Skin is warm and dry. No rash noted. No erythema. No pallor.  Psychiatric: She has a normal mood and affect. Her behavior is normal.  Nursing note and vitals reviewed.  ED Treatments / Results  DIAGNOSTIC STUDIES: Oxygen Saturation is 99% on RA, normal by my interpretation.   COORDINATION OF CARE: 6:18 PM-Discussed next steps with pt. Pt verbalized understanding and is agreeable with the plan.   Labs (all labs ordered are listed, but only abnormal results are displayed) Labs Reviewed - No data to display  EKG  EKG Interpretation None      Radiology Dg Chest 2 View  Result Date: 09/19/2016 CLINICAL DATA:  Cough.  Shortness of breath. EXAM: CHEST  2 VIEW COMPARISON:  September 08, 2016 FINDINGS: The heart size and mediastinal contours are within normal limits. Both lungs are clear. The visualized skeletal structures are unremarkable. IMPRESSION: No active cardiopulmonary disease. Electronically Signed   By: Dorise Bullion III M.D   On: 09/19/2016 18:13   Procedures Procedures   Medications Ordered in ED Medications  albuterol (PROVENTIL) (2.5 MG/3ML) 0.083% nebulizer solution 5 mg (5 mg Nebulization Given 09/19/16 1811)   Initial Impression / Assessment and Plan / ED Course  I have reviewed the triage vital signs and the nursing notes.  Pertinent labs & imaging results that were available during my care of the patient were reviewed by me and considered in my medical decision making (see chart for details).     75yF with dyspnea. I felt I heard some adventitious breath sounds in LLL but CXR w/o acute abnormality. With hx of COPD and increased cough, will place on abx. Afebrile. Nontoxic. WOB not significantly increased.     Final Clinical Impressions(s) / ED Diagnoses   Final diagnoses:  COPD exacerbation ([place])   New Prescriptions New Prescriptions   No medications on file   I personally  preformed the services scribed in my presence. The recorded information has been reviewed is accurate. Virgel Manifold, MD.     Virgel Manifold, MD 10/01/16 [phone]

## 2016-09-19 NOTE — ED Notes (Signed)
Patient asking to see EDP at this time. Let patient know I would make him aware of her request.

## 2016-09-20 NOTE — ED Notes (Signed)
Tammy from [facility] called and reports levquin interacts with patients' amiodarone.  Notified H. Marshell Levan PA and prescription changed to doxycycline 100mg  po bid dispense 14.

## 2016-09-24 ENCOUNTER — Ambulatory Visit (HOSPITAL_COMMUNITY)
Admission: RE | Admit: 2016-09-24 | Discharge: 2016-09-24 | Disposition: A | Discharge status: Home / Self Care | Payer: Medicare Other | Source: Ambulatory Visit | Attending: Pulmonary Disease | Admitting: Pulmonary Disease

## 2016-09-24 DIAGNOSIS — J849 Interstitial pulmonary disease, unspecified: Secondary | ICD-10-CM | POA: Diagnosis present

## 2016-09-24 DIAGNOSIS — I2510 Atherosclerotic heart disease of native coronary artery without angina pectoris: Secondary | ICD-10-CM | POA: Diagnosis not present

## 2016-09-24 DIAGNOSIS — J439 Emphysema, unspecified: Secondary | ICD-10-CM | POA: Diagnosis not present

## 2016-09-24 DIAGNOSIS — I700 Atherosclerosis of aorta: Secondary | ICD-10-CM | POA: Insufficient documentation

## 2016-09-27 NOTE — Progress Notes (Signed)
Reviewed labs done while inpatient in ED this month. Hgb normal first of month but slight decline in setting of acute illness. 12 down to 11.5.  Would recommend rechecking CBC in 6 weeks.

## 2016-09-29 ENCOUNTER — Other Ambulatory Visit: Payer: Self-pay | Admitting: Gastroenterology

## 2016-09-29 DIAGNOSIS — D509 Iron deficiency anemia, unspecified: Secondary | ICD-10-CM

## 2016-09-29 NOTE — Progress Notes (Signed)
Lab order on file.

## 2016-09-30 ENCOUNTER — Telehealth: Payer: Self-pay | Admitting: Pulmonary Disease

## 2016-09-30 NOTE — Telephone Encounter (Signed)
Error.Stanley A Dalton ° °

## 2016-09-30 NOTE — Telephone Encounter (Signed)
Patricia Garfinkel, MD  [place], Patricia Jackson, CMA        Please let the patient know that the CT does not show any interstitial lung disease. There are COPD changes and Jackson lung nodule that appears benign.   There is also evidence of coronary artery disease. Make sure she continues to see her cardiologist.    Spoke with pt, aware of results/recs.  Nothing further needed.

## 2016-10-07 ENCOUNTER — Other Ambulatory Visit: Payer: Self-pay

## 2016-10-07 DIAGNOSIS — D509 Iron deficiency anemia, unspecified: Secondary | ICD-10-CM

## 2016-10-28 ENCOUNTER — Encounter (HOSPITAL_COMMUNITY): Payer: Self-pay | Admitting: Emergency Medicine

## 2016-10-28 ENCOUNTER — Observation Stay (HOSPITAL_COMMUNITY)
Admission: EM | Admit: 2016-10-28 | Discharge: 2016-10-29 | Disposition: A | Discharge status: Home / Self Care | Payer: Medicare Other | Attending: Internal Medicine | Admitting: Internal Medicine

## 2016-10-28 ENCOUNTER — Emergency Department (HOSPITAL_COMMUNITY): Payer: Medicare Other

## 2016-10-28 DIAGNOSIS — Z955 Presence of coronary angioplasty implant and graft: Secondary | ICD-10-CM | POA: Insufficient documentation

## 2016-10-28 DIAGNOSIS — I120 Hypertensive chronic kidney disease with stage 5 chronic kidney disease or end stage renal disease: Secondary | ICD-10-CM | POA: Diagnosis not present

## 2016-10-28 DIAGNOSIS — D649 Anemia, unspecified: Secondary | ICD-10-CM | POA: Diagnosis present

## 2016-10-28 DIAGNOSIS — J449 Chronic obstructive pulmonary disease, unspecified: Secondary | ICD-10-CM | POA: Diagnosis not present

## 2016-10-28 DIAGNOSIS — I252 Old myocardial infarction: Secondary | ICD-10-CM | POA: Diagnosis not present

## 2016-10-28 DIAGNOSIS — Z853 Personal history of malignant neoplasm of breast: Secondary | ICD-10-CM | POA: Insufficient documentation

## 2016-10-28 DIAGNOSIS — N185 Chronic kidney disease, stage 5: Secondary | ICD-10-CM | POA: Diagnosis not present

## 2016-10-28 DIAGNOSIS — F329 Major depressive disorder, single episode, unspecified: Secondary | ICD-10-CM

## 2016-10-28 DIAGNOSIS — I2510 Atherosclerotic heart disease of native coronary artery without angina pectoris: Secondary | ICD-10-CM | POA: Diagnosis not present

## 2016-10-28 DIAGNOSIS — I10 Essential (primary) hypertension: Secondary | ICD-10-CM | POA: Diagnosis present

## 2016-10-28 DIAGNOSIS — J8410 Pulmonary fibrosis, unspecified: Secondary | ICD-10-CM | POA: Diagnosis present

## 2016-10-28 DIAGNOSIS — E782 Mixed hyperlipidemia: Secondary | ICD-10-CM | POA: Diagnosis present

## 2016-10-28 DIAGNOSIS — C50919 Malignant neoplasm of unspecified site of unspecified female breast: Secondary | ICD-10-CM | POA: Diagnosis not present

## 2016-10-28 DIAGNOSIS — Z87891 Personal history of nicotine dependence: Secondary | ICD-10-CM | POA: Insufficient documentation

## 2016-10-28 DIAGNOSIS — R079 Chest pain, unspecified: Secondary | ICD-10-CM | POA: Diagnosis not present

## 2016-10-28 DIAGNOSIS — I4892 Unspecified atrial flutter: Secondary | ICD-10-CM | POA: Diagnosis present

## 2016-10-28 DIAGNOSIS — F32A Depression, unspecified: Secondary | ICD-10-CM

## 2016-10-28 DIAGNOSIS — M25519 Pain in unspecified shoulder: Secondary | ICD-10-CM | POA: Diagnosis not present

## 2016-10-28 DIAGNOSIS — F418 Other specified anxiety disorders: Secondary | ICD-10-CM

## 2016-10-28 DIAGNOSIS — N184 Chronic kidney disease, stage 4 (severe): Secondary | ICD-10-CM | POA: Diagnosis present

## 2016-10-28 DIAGNOSIS — Z79899 Other long term (current) drug therapy: Secondary | ICD-10-CM | POA: Diagnosis not present

## 2016-10-28 DIAGNOSIS — E785 Hyperlipidemia, unspecified: Secondary | ICD-10-CM | POA: Diagnosis present

## 2016-10-28 DIAGNOSIS — R0789 Other chest pain: Secondary | ICD-10-CM

## 2016-10-28 LAB — CBC
HCT: 34.8 % — ABNORMAL LOW (ref 36.0–46.0)
Hemoglobin: 11.4 g/dL — ABNORMAL LOW (ref 12.0–15.0)
MCH: 29.6 pg (ref 26.0–34.0)
MCHC: 32.8 g/dL (ref 30.0–36.0)
MCV: 90.4 fL (ref 78.0–100.0)
Platelets: 170 10*3/uL (ref 150–400)
RBC: 3.85 MIL/uL — ABNORMAL LOW (ref 3.87–5.11)
RDW: 16.2 % — ABNORMAL HIGH (ref 11.5–15.5)
WBC: 6 10*3/uL (ref 4.0–10.5)

## 2016-10-28 LAB — MAGNESIUM: Magnesium: 2.2 mg/dL (ref 1.7–2.4)

## 2016-10-28 LAB — BASIC METABOLIC PANEL
Anion gap: 6 (ref 5–15)
BUN: 34 mg/dL — ABNORMAL HIGH (ref 6–20)
CO2: 22 mmol/L (ref 22–32)
Calcium: 9.2 mg/dL (ref 8.9–10.3)
Chloride: 113 mmol/L — ABNORMAL HIGH (ref 101–111)
Creatinine, Ser: 1.82 mg/dL — ABNORMAL HIGH (ref 0.44–1.00)
GFR calc Af Amer: 31 mL/min — ABNORMAL LOW (ref >60)
GFR calc non Af Amer: 26 mL/min — ABNORMAL LOW (ref >60)
Glucose, Bld: 107 mg/dL — ABNORMAL HIGH (ref 65–99)
Potassium: 4.4 mmol/L (ref 3.5–5.1)
Sodium: 141 mmol/L (ref 135–145)

## 2016-10-28 LAB — TROPONIN I
Troponin I: <0.03 ng/mL (ref <0.03)
Troponin I: <0.03 ng/mL (ref <0.03)

## 2016-10-28 MED ORDER — LORATADINE 10 MG PO TABS
10.0000 mg | ORAL_TABLET | Freq: Every day | ORAL | Status: DC
  Administered 2016-10-28: 10 mg via ORAL
  Filled 2016-10-28: qty 1

## 2016-10-28 MED ORDER — KETOROLAC TROMETHAMINE 15 MG/ML IJ SOLN
15.0000 mg | Freq: Once | INTRAMUSCULAR | Status: AC
  Administered 2016-10-28: 15 mg via INTRAVENOUS
  Filled 2016-10-28: qty 1

## 2016-10-28 MED ORDER — ALPRAZOLAM 0.25 MG PO TABS
0.2500 mg | ORAL_TABLET | Freq: Two times a day (BID) | ORAL | Status: DC | PRN
  Administered 2016-10-28: 0.25 mg via ORAL
  Filled 2016-10-28: qty 1

## 2016-10-28 MED ORDER — LEVOCETIRIZINE DIHYDROCHLORIDE 5 MG PO TABS: 5.0000 mg | ORAL_TABLET | Freq: Every day | ORAL | Status: DC

## 2016-10-28 MED ORDER — ENOXAPARIN SODIUM 30 MG/0.3ML ~~LOC~~ SOLN: 30.0000 mg | SUBCUTANEOUS | Status: DC

## 2016-10-28 MED ORDER — NITROGLYCERIN 2 % TD OINT
1.0000 [in_us] | TOPICAL_OINTMENT | Freq: Once | TRANSDERMAL | Status: AC
  Administered 2016-10-28: 1 [in_us] via TOPICAL
  Filled 2016-10-28: qty 1

## 2016-10-28 MED ORDER — DIPHENHYDRAMINE HCL 25 MG PO CAPS: 50.0000 mg | ORAL_CAPSULE | Freq: Every evening | ORAL | Status: DC | PRN

## 2016-10-28 MED ORDER — NITROGLYCERIN 0.4 MG SL SUBL
0.4000 mg | SUBLINGUAL_TABLET | SUBLINGUAL | Status: DC | PRN
Start: 2016-10-28 — End: 2016-10-29

## 2016-10-28 MED ORDER — ACETAMINOPHEN 325 MG PO TABS
650.0000 mg | ORAL_TABLET | ORAL | Status: DC | PRN
  Administered 2016-10-28 – 2016-10-29 (×2): 650 mg via ORAL
  Filled 2016-10-28 (×3): qty 2

## 2016-10-28 MED ORDER — AMLODIPINE BESYLATE 5 MG PO TABS
5.0000 mg | ORAL_TABLET | Freq: Every day | ORAL | Status: DC
  Filled 2016-10-28: qty 1

## 2016-10-28 MED ORDER — ROSUVASTATIN CALCIUM 20 MG PO TABS: 20.0000 mg | ORAL_TABLET | Freq: Every day | ORAL | Status: DC

## 2016-10-28 MED ORDER — METOPROLOL SUCCINATE ER 25 MG PO TB24
12.5000 mg | ORAL_TABLET | Freq: Every day | ORAL | Status: DC
  Filled 2016-10-28: qty 1

## 2016-10-28 MED ORDER — ALBUTEROL SULFATE (2.5 MG/3ML) 0.083% IN NEBU: 2.5000 mg | INHALATION_SOLUTION | Freq: Four times a day (QID) | RESPIRATORY_TRACT | Status: DC | PRN

## 2016-10-28 MED ORDER — CLOPIDOGREL BISULFATE 75 MG PO TABS
75.0000 mg | ORAL_TABLET | Freq: Every day | ORAL | Status: DC
  Administered 2016-10-29: 75 mg via ORAL
  Filled 2016-10-28: qty 1

## 2016-10-28 MED ORDER — ONDANSETRON HCL 4 MG/2ML IJ SOLN: 4.0000 mg | Freq: Four times a day (QID) | INTRAMUSCULAR | Status: DC | PRN

## 2016-10-28 MED ORDER — PANTOPRAZOLE SODIUM 40 MG PO TBEC
40.0000 mg | DELAYED_RELEASE_TABLET | Freq: Every day | ORAL | Status: DC
Start: 2016-10-29 — End: 2016-10-29
  Administered 2016-10-29 (×2): 40 mg via ORAL
  Filled 2016-10-28: qty 1

## 2016-10-28 MED ORDER — AMIODARONE HCL 200 MG PO TABS
200.0000 mg | ORAL_TABLET | Freq: Every day | ORAL | Status: DC
  Administered 2016-10-29: 200 mg via ORAL
  Filled 2016-10-28: qty 1

## 2016-10-28 MED ORDER — MOMETASONE FURO-FORMOTEROL FUM 200-5 MCG/ACT IN AERO
2.0000 | INHALATION_SPRAY | Freq: Two times a day (BID) | RESPIRATORY_TRACT | Status: DC
  Administered 2016-10-29: 2 via RESPIRATORY_TRACT
  Filled 2016-10-28: qty 8.8

## 2016-10-28 MED ORDER — BUPROPION HCL ER (XL) 300 MG PO TB24
300.0000 mg | ORAL_TABLET | Freq: Every day | ORAL | Status: DC
  Administered 2016-10-29: 300 mg via ORAL
  Filled 2016-10-28: qty 1

## 2016-10-28 MED ORDER — NITROGLYCERIN 2 % TD OINT: 1.0000 [in_us] | TOPICAL_OINTMENT | Freq: Once | TRANSDERMAL | Status: DC

## 2016-10-28 MED ORDER — UMECLIDINIUM BROMIDE 62.5 MCG/INH IN AEPB
1.0000 | INHALATION_SPRAY | Freq: Every day | RESPIRATORY_TRACT | Status: DC
  Administered 2016-10-29: 1 via RESPIRATORY_TRACT
  Filled 2016-10-28: qty 7

## 2016-10-28 MED ORDER — FERROUS SULFATE 325 (65 FE) MG PO TABS
325.0000 mg | ORAL_TABLET | Freq: Every day | ORAL | Status: DC
  Administered 2016-10-29: 325 mg via ORAL
  Filled 2016-10-28: qty 1

## 2016-10-28 MED ORDER — ACETAMINOPHEN 500 MG PO TABS: 500.0000 mg | ORAL_TABLET | Freq: Four times a day (QID) | ORAL | Status: DC | PRN

## 2016-10-28 NOTE — ED Provider Notes (Signed)
[facility] DEPT Provider Note   CSN: [id number] Arrival date & time: 10/28/16  1352     History   Chief Complaint Chief Complaint  Patient presents with  . Chest Pain    HPI Patricia Jackson is a 76 y.o. female.  Patient states that she's been having upper back pain. She states the pain is similar to when she had a stent placed in December. For a few days the pain is been coming and going but today she stated the pain lasted for 40 minutes after she took nitroglycerin it finally went away.   The history is provided by the patient.  Chest Pain   This is a recurrent problem. The current episode started more than 2 days ago. The problem occurs constantly. The problem has been resolved. The pain is associated with exertion. Pain location: Upper back. The pain is at a severity of 4/10. The pain is moderate. The quality of the pain is described as heavy. The pain does not radiate. Pertinent negatives include no abdominal pain, no back pain, no cough, no headaches and no palpitations.  Pertinent negatives for past medical history include no seizures.    Past Medical History:  Diagnosis Date  . Atrial flutter ([place])    On Eliquis in 8/17 but discontinued after stent placement  . Breast cancer ([place])    remote  . CAD in native artery 03/16/2016  . COPD (chronic obstructive pulmonary disease) ([place])   . Hypertension   . S/P angioplasty with stent 03/15/16 DES Resolute, pLCX 03/16/2016    Patient Active Problem List   Diagnosis Date Noted  . AKI (acute kidney injury) ([place]) 09/08/2016  . Hyperglycemia 09/08/2016  . Esophageal dysphagia   . Anemia 04/07/2016  . Sinus bradycardia   . Hx of heart artery stent   . CAD in native artery 03/16/2016  . S/P angioplasty with stent 03/15/16 DES Resolute, [place] 03/16/2016  . Acute coronary syndrome ([place])   . NSTEMI (non-ST elevated myocardial infarction) ([place]) 03/12/2016  . Shortness of breath   . Acute respiratory failure with hypoxia  ([place])   . Hypophosphatemia   . 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[place]) 10/31/2015  . Acute exacerbation of chronic bronchitis ([place]) 10/31/2015  . COPD exacerbation ([place]) 10/31/2015  . Dyslipidemia 10/31/2015  . Elevated troponin 10/31/2015  . Atrial flutter with rapid ventricular response ([place]) 10/30/2015  . SOB (shortness of breath) 11/26/2013  . Back pain 11/26/2013  . Arm pain 11/26/2013  . Chest pain 11/26/2013  . Hypertension     Past Surgical History:  Procedure Laterality Date  . APPENDECTOMY    . BREAST SURGERY    . CARDIAC CATHETERIZATION N/A 03/15/2016   Procedure: Left Heart Cath and Coronary Angiography;  Surgeon: Belva Crome, MD;  Location: [facility];  Service: Cardiovascular;  Laterality: N/A;  . CARDIAC CATHETERIZATION N/A 03/15/2016   Procedure: Coronary Stent Intervention;  Surgeon: Belva Crome, MD;  Location: [facility];  Service: Cardiovascular;  Laterality: N/A;  . COLONOSCOPY     remote  . COLONOSCOPY N/A 05/26/2016   Procedure: COLONOSCOPY;  Surgeon: Daneil Dolin, MD;  [facility];  Service: Endoscopy;  Laterality: N/A;  845  . ESOPHAGOGASTRODUODENOSCOPY N/A 05/26/2016   Procedure: ESOPHAGOGASTRODUODENOSCOPY (EGD);  Surgeon: Daneil Dolin, MD;  [facility];  Service: Endoscopy;  Laterality: N/A;  . Venia Minks DILATION N/A 05/26/2016   Procedure: Venia Minks DILATION;  Surgeon: Daneil Dolin, MD;  [facility];  Service: Endoscopy;  Laterality: N/A;    OB History    Gravida Para Term Preterm AB Living             1   SAB TAB Ectopic Multiple Live Births                   Home Medications    Prior to Admission medications   Medication Sig Start Date End Date Taking? Authorizing Provider  acetaminophen (TYLENOL) 500 MG tablet Take 500 mg by mouth every 6 (six) hours as needed for mild pain, moderate pain, fever or headache.    Yes [provider]  albuterol (PROVENTIL HFA;VENTOLIN HFA) 108 (90 Base) MCG/ACT  inhaler Inhale 2 puffs into the lungs every 6 (six) hours as needed for wheezing or shortness of breath.   Yes [provider]  albuterol (PROVENTIL) (2.5 MG/3ML) 0.083% nebulizer solution Take 3 mLs (2.5 mg total) by nebulization every 6 (six) hours as needed for wheezing or shortness of breath. 11/08/15  Yes Almyra Deforest, PA  amiodarone (PACERONE) 200 MG tablet Take 1 tablet (200 mg total) by mouth daily. 05/18/16  Yes Lendon Colonel, NP  amLODipine (NORVASC) 5 MG tablet Take 1 tablet (5 mg total) by mouth daily. 03/16/16  Yes Isaiah Serge, NP  buPROPion (WELLBUTRIN XL) 300 MG 24 hr tablet Take 300 mg by mouth daily.    Yes [provider]  clopidogrel (PLAVIX) 75 MG tablet Take 1 tablet (75 mg total) by mouth daily. 04/06/16  Yes Lendon Colonel, NP  diphenhydrAMINE (BENADRYL) 25 MG tablet Take 50 mg by mouth at bedtime as needed for sleep.   Yes [provider]  ferrous sulfate (FERROUSUL) 325 (65 FE) MG tablet Take 1 tablet (325 mg total) by mouth daily with breakfast. 04/08/16  Yes Rosita Fire, MD  levocetirizine (XYZAL) 5 MG tablet Take 5 mg by mouth at bedtime.    Yes [provider]  metoprolol succinate (TOPROL-XL) 25 MG 24 hr tablet Take 12.5 mg by mouth daily.   Yes [provider]  nitroGLYCERIN (NITROSTAT) 0.4 MG SL tablet Place 0.4 mg under the tongue every 5 (five) minutes as needed for chest pain.   Yes [provider]  pantoprazole (PROTONIX) 20 MG tablet Take 20 mg by mouth daily.    Yes [provider]  rosuvastatin (CRESTOR) 20 MG tablet Take 20 mg by mouth daily.    Yes [provider]  SYMBICORT 160-4.5 MCG/ACT inhaler Inhale 1 puff into the lungs daily. 02/16/16  Yes Mannam, Praveen, MD  umeclidinium bromide (INCRUSE ELLIPTA) 62.5 MCG/INH AEPB Inhale 1 puff into the lungs daily.   Yes [provider]    Family History Family History  Problem Relation Age of Onset  . Heart disease  Mother   . COPD Father   . Cancer Sister        unknown primary  . Cancer Brother        unknown primary  . Colon cancer Neg Hx     Social History Social History  Substance Use Topics  . Smoking status: Former Smoker    Packs/day: 1.00    Years: 56.00    Types: Cigarettes    Start date: 12/15/1965    Quit date: 10/29/2015  . Smokeless tobacco: Never Used  . Alcohol use No     Allergies   Sulfa antibiotics   Review of Systems Review of Systems  Constitutional: Negative for appetite change and  fatigue.  HENT: Negative for congestion, ear discharge and sinus pressure.   Eyes: Negative for discharge.  Respiratory: Negative for cough.   Cardiovascular: Negative for palpitations.  Gastrointestinal: Negative for abdominal pain and diarrhea.  Genitourinary: Negative for frequency and hematuria.  Musculoskeletal: Negative for back pain.       Upper back pain  Skin: Negative for rash.  Neurological: Negative for seizures and headaches.  Psychiatric/Behavioral: Negative for hallucinations.     Physical Exam Updated Vital Signs BP 124/61   Pulse (!) 59   Temp 97.7 F (36.5 C) (Oral)   Resp 14   Ht 5\' 8"  (1.727 m)   Wt 61.2 kg (135 lb)   SpO2 99%   BMI 20.53 kg/m   Physical Exam  Constitutional: She is oriented to person, place, and time. She appears well-developed.  HENT:  Head: Normocephalic.  Eyes: Conjunctivae and EOM are normal. No scleral icterus.  Neck: Neck supple. No thyromegaly present.  Cardiovascular: Normal rate and regular rhythm.  Exam reveals no gallop and no friction rub.   No murmur heard. Pulmonary/Chest: No stridor. She has no wheezes. She has no rales. She exhibits no tenderness.  Abdominal: She exhibits no distension. There is no tenderness. There is no rebound.  Musculoskeletal: Normal range of motion. She exhibits no edema.  Lymphadenopathy:    She has no cervical adenopathy.  Neurological: She is oriented to person, place, and time. She  exhibits normal muscle tone. Coordination normal.  Skin: No rash noted. No erythema.  Psychiatric: She has a normal mood and affect. Her behavior is normal.     ED Treatments / Results  Labs (all labs ordered are listed, but only abnormal results are displayed) Labs Reviewed  BASIC METABOLIC PANEL - Abnormal; Notable for the following:       Result Value   Chloride 113 (*)    Glucose, Bld 107 (*)    BUN 34 (*)    Creatinine, Ser 1.82 (*)    GFR calc non Af Amer 26 (*)    GFR calc Af Amer 31 (*)    All other components within normal limits  CBC - Abnormal; Notable for the following:    RBC 3.85 (*)    Hemoglobin 11.4 (*)    HCT 34.8 (*)    RDW 16.2 (*)    All other components within normal limits  TROPONIN I    EKG  EKG Interpretation  Date/Time:  Thursday October 28 2016 14:02:21 EDT Ventricular Rate:  63 PR Interval:  174 QRS Duration: 96 QT Interval:  484 QTC Calculation: 495 R Axis:   86 Text Interpretation:  Normal sinus rhythm Prolonged QT Abnormal ECG Confirmed by Milton Ferguson [phone]) on 10/28/2016 2:20:43 PM       Radiology Dg Chest 2 View  Result Date: 10/28/2016 CLINICAL DATA:  Pain both shoulders.  Breast cancer. EXAM: CHEST  2 VIEW COMPARISON:  Chest x-ray 09/19/2016. FINDINGS: Mediastinum and hilar structures normal. Lungs are clear. No pleural effusion or pneumothorax . Bilateral pleuroparenchymal thickening consistent with scarring. COPD. Surgical clips left chest . Left mastectomy. Diffuse osteopenia and degenerative change. No acute bony abnormality. IMPRESSION: 1. Bilateral pleural-parenchymal scarring. COPD. No acute pulmonary infiltrate. 2. Left mastectomy. Electronically Signed   By: Marcello Moores  Register   On: 10/28/2016 15:30    Procedures Procedures (including critical care time)  Medications Ordered in ED Medications  nitroGLYCERIN (NITROGLYN) 2 % ointment 1 inch (1 inch Topical Given 10/28/16 1438)  Initial Impression / Assessment and Plan  / ED Course  I have reviewed the triage vital signs and the nursing notes.  Pertinent labs & imaging results that were available during my care of the patient were reviewed by me and considered in my medical decision making (see chart for details).     Patient with back pain probably related to angina. I spoke with Dr. Lovena Le from cardiology and his recommendations were to admit patient to medicine service on telemetry. Cycle her cardiac enzymes. Consult cardiology if necessary. Do not place patient on heparin.  Final Clinical Impressions(s) / ED Diagnoses   Final diagnoses:  None    New Prescriptions New Prescriptions   No medications on file     Milton Ferguson, MD 10/28/16 1625

## 2016-10-28 NOTE — H&P (Signed)
History and Physical    Patricia Jackson WUJ:[id number] DOB: [birth year] DOA: 10/28/2016  PCP: Patricia Squibb, MD   Patient coming from: Home.  I have personally briefly reviewed patient's old medical records in [place]  Chief Complaint: Chest pain.  HPI: Patricia Jackson is a 76 y.o. female with medical history significant of atrial flutter, remote breast cancer history, CAD of native arteries (history of angioplasty with stent on 03/15/2016,, COPD, hypertension is coming to the emergency department with complaints of chest pain.  Per patient, around 1300 today she was sitting down when she felt pains in both of her arms going to her back. She describes this pain as similar as the pressure-like pain she had last year when she had to have the coronary stent placed. She was diaphoretic, felt mildly short of breath and nauseous, but denies dizziness, palpitations, emesis, dyspnea on exertion, recent PND, orthopnea or pitting edema of the lower extremities. She took one nitroglycerin sublingually without significant relief. There were no relieving or exacerbating factors. However, she feels that the nitro paste given in emergency department helped. She is currently chest pain-free. She denies fever, chills, sore throat, wheezing, abdominal pain, diarrhea, constipation, melena or hematochezia. She denies dysuria, frequency or hematuria. She denies polyuria, polydipsia or blurry vision. She denies skin rashes or pruritus.  ED Course: Initial vital signs temperature 97.77F, pulse 61, blood pressure 158/66, respirations 18 and O2 sat 97% on room air. Her basic metabolic profile shows sodium 141, potassium 4.4, chloride 113 and bicarbonate 22 mmol/L. BUN was 34, creatinine 1.82 and glucose 107. Troponin level was negative. EKG showed sinus rhythm with mildly prolonged QT. Her chest radiograph showed bilateral pleural scaring and COPD, but no acute cardiopulmonary pathology.  Review of Systems: As per  HPI otherwise 10 point review of systems negative.    Past Medical History:  Diagnosis Date  . Atrial flutter ([place])    On Eliquis in 8/17 but discontinued after stent placement  . Breast cancer ([place])    remote  . CAD in native artery 03/16/2016  . COPD (chronic obstructive pulmonary disease) ([place])   . Hypertension   . S/P angioplasty with stent 03/15/16 [facility] [place] [place] 03/16/2016    Past Surgical History:  Procedure Laterality Date  . APPENDECTOMY    . BREAST SURGERY    . CARDIAC CATHETERIZATION N/A 03/15/2016   Procedure: Left Heart Cath and Coronary Angiography;  Surgeon: Belva Crome, MD;  Location: [place] [facility];  Service: Cardiovascular;  Laterality: N/A;  . CARDIAC CATHETERIZATION N/A 03/15/2016   Procedure: Coronary Stent Intervention;  Surgeon: Belva Crome, MD;  Location: [place] [facility];  Service: Cardiovascular;  Laterality: N/A;  . COLONOSCOPY     remote  . COLONOSCOPY N/A 05/26/2016   Procedure: COLONOSCOPY;  Surgeon: Daneil Dolin, MD;  [facility];  Service: Endoscopy;  Laterality: N/A;  845  . ESOPHAGOGASTRODUODENOSCOPY N/A 05/26/2016   Procedure: ESOPHAGOGASTRODUODENOSCOPY (EGD);  Surgeon: Daneil Dolin, MD;  [facility];  Service: Endoscopy;  Laterality: N/A;  Venia Minks DILATION N/A 05/26/2016   Procedure: Venia Minks DILATION;  Surgeon: Daneil Dolin, MD;  Location: [facility];  Service: Endoscopy;  Laterality: N/A;     reports that she quit smoking about a year ago. Her smoking use included Cigarettes. She started smoking about 50 years ago. She has a 56.00 pack-year smoking history. She has never used smokeless tobacco. She reports that she does not drink alcohol  or use drugs.  Allergies  Allergen Reactions  . Sulfa Antibiotics Hives    Family History  Problem Relation Age of Onset  . Heart disease Mother   . COPD Father   . Cancer Sister        unknown primary  . Cancer Brother        unknown primary  . Colon  cancer Neg Hx     Prior to Admission medications   Medication Sig Start Date End Date Taking? Authorizing Provider  acetaminophen (TYLENOL) 500 MG tablet Take 500 mg by mouth every 6 (six) hours as needed for mild pain, moderate pain, fever or headache.    Yes [provider]  albuterol (PROVENTIL HFA;VENTOLIN HFA) 108 (90 Base) MCG/ACT inhaler Inhale 2 puffs into the lungs every 6 (six) hours as needed for wheezing or shortness of breath.   Yes [provider]  albuterol (PROVENTIL) (2.5 MG/3ML) 0.083% nebulizer solution Take 3 mLs (2.5 mg total) by nebulization every 6 (six) hours as needed for wheezing or shortness of breath. 11/08/15  Yes Almyra Deforest, PA  amiodarone (PACERONE) 200 MG tablet Take 1 tablet (200 mg total) by mouth daily. 05/18/16  Yes Lendon Colonel, NP  amLODipine (NORVASC) 5 MG tablet Take 1 tablet (5 mg total) by mouth daily. 03/16/16  Yes Isaiah Serge, NP  buPROPion (WELLBUTRIN XL) 300 MG 24 hr tablet Take 300 mg by mouth daily.    Yes [provider]  clopidogrel (PLAVIX) 75 MG tablet Take 1 tablet (75 mg total) by mouth daily. 04/06/16  Yes Lendon Colonel, NP  diphenhydrAMINE (BENADRYL) 25 MG tablet Take 50 mg by mouth at bedtime as needed for sleep.   Yes [provider]  ferrous sulfate (FERROUSUL) 325 (65 FE) MG tablet Take 1 tablet (325 mg total) by mouth daily with breakfast. 04/08/16  Yes Rosita Fire, MD  levocetirizine (XYZAL) 5 MG tablet Take 5 mg by mouth at bedtime.    Yes [provider]  metoprolol succinate (TOPROL-XL) 25 MG 24 hr tablet Take 12.5 mg by mouth daily.   Yes [provider]  nitroGLYCERIN (NITROSTAT) 0.4 MG SL tablet Place 0.4 mg under the tongue every 5 (five) minutes as needed for chest pain.   Yes [provider]  pantoprazole (PROTONIX) 20 MG tablet Take 20 mg by mouth daily.    Yes [provider]  rosuvastatin (CRESTOR) 20 MG tablet Take 20 mg by mouth  daily.    Yes [provider]  SYMBICORT 160-4.5 MCG/ACT inhaler Inhale 1 puff into the lungs daily. 02/16/16  Yes Mannam, Praveen, MD  umeclidinium bromide (INCRUSE ELLIPTA) 62.5 MCG/INH AEPB Inhale 1 puff into the lungs daily.   Yes [provider]    Physical Exam: Vitals:   10/28/16 1400 10/28/16 1430 10/28/16 1500 10/28/16 1530  BP:  130/65 131/65 124/61  Pulse:  (!) 59 (!) 59 (!) 59  Resp:  18 (!) 25 14  Temp:      TempSrc:      SpO2:  98% 96% 99%  Weight: 61.2 kg (135 lb)     Height: 5' 8"  (1.727 m)       Constitutional: NAD, calm, comfortable Eyes: PERRL, lids and conjunctivae normal ENMT: Mucous membranes are moist. Posterior pharynx clear of any exudate or lesions. Neck: normal, supple, no masses, no thyromegaly Respiratory: clear to auscultation bilaterally, no wheezing, no crackles. Normal respiratory effort. No accessory muscle use.  Cardiovascular: Regular rate and  rhythm, no murmurs / rubs / gallops. No extremity edema. 2+ pedal pulses. No carotid bruits.  Abdomen: Soft, no tenderness, no masses palpated. No hepatosplenomegaly. Bowel sounds positive.  Musculoskeletal: no clubbing / cyanosis. Good ROM, no contractures. Normal muscle tone.  Skin: Multiple small ecchymosis areas on lower and upper extremities. Neurologic: CN 2-12 grossly intact. Sensation intact, DTR normal. Strength 5/5 in all 4.  Psychiatric: Normal judgment and insight. Alert and oriented x 4. Normal mood.    Labs on Admission: I have personally reviewed following labs and imaging studies  CBC:  Recent Labs Lab 10/28/16 1418  WBC 6.0  HGB 11.4*  HCT 34.8*  MCV 90.4  PLT 250   Basic Metabolic Panel:  Recent Labs Lab 10/28/16 1418  NA 141  K 4.4  CL 113*  CO2 22  GLUCOSE 107*  BUN 34*  CREATININE 1.82*  CALCIUM 9.2   GFR: Estimated Creatinine Clearance: 26.6 mL/min (A) (by C-G formula based on SCr of 1.82 mg/dL (H)). Liver Function Tests: No results for  input(s): AST, ALT, ALKPHOS, BILITOT, PROT, ALBUMIN in the last 168 hours. No results for input(s): LIPASE, AMYLASE in the last 168 hours. No results for input(s): AMMONIA in the last 168 hours. Coagulation Profile: No results for input(s): INR, PROTIME in the last 168 hours. Cardiac Enzymes:  Recent Labs Lab 10/28/16 1418  TROPONINI <0.03   BNP (last 3 results) No results for input(s): PROBNP in the last 8760 hours. HbA1C: No results for input(s): HGBA1C in the last 72 hours. CBG: No results for input(s): GLUCAP in the last 168 hours. Lipid Profile: No results for input(s): CHOL, HDL, LDLCALC, TRIG, CHOLHDL, LDLDIRECT in the last 72 hours. Thyroid Function Tests: No results for input(s): TSH, T4TOTAL, FREET4, T3FREE, THYROIDAB in the last 72 hours. Anemia Panel: No results for input(s): VITAMINB12, FOLATE, FERRITIN, TIBC, IRON, RETICCTPCT in the last 72 hours. Urine analysis:    Component Value Date/Time   COLORURINE YELLOW 04/07/2016 [place] 04/07/2016 1353   LABSPEC 1.017 04/07/2016 1353   PHURINE 5.0 04/07/2016 1353   GLUCOSEU NEGATIVE 04/07/2016 1353   HGBUR NEGATIVE 04/07/2016 1353   BILIRUBINUR NEGATIVE 04/07/2016 1353   KETONESUR NEGATIVE 04/07/2016 1353   PROTEINUR NEGATIVE 04/07/2016 1353   NITRITE NEGATIVE 04/07/2016 1353   LEUKOCYTESUR SMALL (A) 04/07/2016 1353    Radiological Exams on Admission: Dg Chest 2 View  Result Date: 10/28/2016 CLINICAL DATA:  Pain both shoulders.  Breast cancer. EXAM: CHEST  2 VIEW COMPARISON:  Chest x-ray 09/19/2016. FINDINGS: Mediastinum and hilar structures normal. Lungs are clear. No pleural effusion or pneumothorax . Bilateral pleuroparenchymal thickening consistent with scarring. COPD. Surgical clips left chest . Left mastectomy. Diffuse osteopenia and degenerative change. No acute bony abnormality. IMPRESSION: 1. Bilateral pleural-parenchymal scarring. COPD. No acute pulmonary infiltrate. 2. Left mastectomy.  Electronically Signed   By: Marcello Moores  Register   On: 10/28/2016 15:30  10/31/2015 echocardiogram complete ------------------------------------------------------------------- LV EF: 55% -   60%  ------------------------------------------------------------------- Indications:      Atrial flutter 427.32.  ------------------------------------------------------------------- History:   PMH:  Elevated Troponin.  Chronic obstructive pulmonary disease.  Risk factors:  Hypertension.  ------------------------------------------------------------------- Study Conclusions  - Left ventricle: Systolic function was normal. The estimated   ejection fraction was in the range of 55% to 60%. Wall motion was   normal; there were no regional wall motion abnormalities. 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 - Mitral valve: Calcified annulus. There was mild to moderate  regurgitation directed centrally. Valve area by pressure   half-time: 2.34 cm^2. - Pulmonary arteries: Systolic pressure was mildly increased. PA   peak pressure: 41 mm Hg (S).  EKG: Independently reviewed. Vent. rate 63 BPM PR interval 174 ms QRS duration 96 ms QT/QTc 484/495 ms P-R-T axes 80 86 78 Normal sinus rhythm Prolonged QT Abnormal ECG  Assessment/Plan Principal Problem:   Chest pain Observation/telemetry. Discussed with cardiology by EDP. Cardiology will like telemetry monitoring with troponin level cycling. Repeat EKG in the morning. Check echocardiogram in the morning. Patient to follow-up as an outpatient if above negative, otherwise call cardiology.  Active Problems:   CAD in native artery Continue clopidogrel, metoprolol and rosuvastatin.    Atrial flutter (HCC) CHA?DS?-VASc of at least 4. Currently on amiodarone 200 mg by mouth daily for rate control. Also on metoprolol succinate 12.5 mg by mouth daily. Not on anticoagulation, but only on Plavix.  She has had problems with  anticoagulation before. The patient states    Hypertension Continue amlodipine 5 mg by mouth daily. Continue metoprolol succinate 12.5 mg by mouth daily. Monitor blood pressure.    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HCC)   COPD (chronic obstructive pulmonary disease) ([place]) The patient is on amiodarone 200 mg by mouth daily. Continues Symbicort 1 inhalation daily or formulary equivalent. Continue Incruse Ellipta  1 inhalation daily. Supplemental oxygen and bronchodilators as needed.    Dyslipidemia Continue Crestor 20 mg by mouth daily. Monitor LFTs as needed. Fasting lipid monitoring as an outpatient.    Depression Continue Wellbutrin XL 300 mg by mouth daily.    Anemia Monitor hematocrit and hemoglobin. Earlier this year, anemia profile showed low iron with normal ferritin and TIBC. Continue iron supplementation.    CKD (chronic kidney disease), stage IV ([place]) Patient's most recent creatinine was 1.56 at about 5+ weeks ago. Follow-up BUN and creatinine tomorrow    DVT prophylaxis: Lovenox SQ. Code Status: Full code. Family Communication:  Disposition Plan: Admit for telemetry monitoring and troponin levels likely. Consults called:  Admission status: Observation/telemetry.   Reubin Milan MD [facility] Pager [phone]  If 7PM-7AM, please contact night-coverage [email or web address] Password [email or web address]  10/28/2016, 4:30 PM

## 2016-10-28 NOTE — ED Triage Notes (Signed)
Pain in bilateral shoulders and arms started approx 48min ago.  No chest pain states she feels like someone is poking her in center of chest.

## 2016-10-29 ENCOUNTER — Observation Stay (HOSPITAL_BASED_OUTPATIENT_CLINIC_OR_DEPARTMENT_OTHER): Payer: Medicare Other

## 2016-10-29 DIAGNOSIS — I340 Nonrheumatic mitral (valve) insufficiency: Secondary | ICD-10-CM | POA: Diagnosis not present

## 2016-10-29 DIAGNOSIS — R079 Chest pain, unspecified: Secondary | ICD-10-CM | POA: Diagnosis not present

## 2016-10-29 LAB — BASIC METABOLIC PANEL
Anion gap: 7 (ref 5–15)
BUN: 36 mg/dL — ABNORMAL HIGH (ref 6–20)
CO2: 20 mmol/L — ABNORMAL LOW (ref 22–32)
Calcium: 8.7 mg/dL — ABNORMAL LOW (ref 8.9–10.3)
Chloride: 111 mmol/L (ref 101–111)
Creatinine, Ser: 1.88 mg/dL — ABNORMAL HIGH (ref 0.44–1.00)
GFR calc Af Amer: 29 mL/min — ABNORMAL LOW (ref >60)
GFR calc non Af Amer: 25 mL/min — ABNORMAL LOW (ref >60)
Glucose, Bld: 86 mg/dL (ref 65–99)
Potassium: 4.5 mmol/L (ref 3.5–5.1)
Sodium: 138 mmol/L (ref 135–145)

## 2016-10-29 LAB — ECHOCARDIOGRAM COMPLETE
Height: 68 in
Weight: 2286.4 oz

## 2016-10-29 LAB — TROPONIN I: Troponin I: <0.03 ng/mL (ref <0.03)

## 2016-10-29 MED ORDER — BUTALBITAL-APAP-CAFFEINE 50-325-40 MG PO TABS
1.0000 | ORAL_TABLET | ORAL | Status: DC | PRN
  Administered 2016-10-29: 1 via ORAL
  Filled 2016-10-29: qty 1

## 2016-10-29 MED ORDER — BUTALBITAL-APAP-CAFFEINE 50-325-40 MG PO TABS: 1.0000 | ORAL_TABLET | ORAL | 0 refills | Status: DC | PRN

## 2016-10-29 NOTE — Progress Notes (Signed)
*  PRELIMINARY RESULTS* Echocardiogram 2D Echocardiogram has been performed.  Patricia Jackson 10/29/2016, 3:34 PM

## 2016-10-29 NOTE — Progress Notes (Signed)
Late entry for this am:  Clarified/verified with Dr. Posey Pronto if it was okay to give the patient her amiodarone and norvasc with a BP of 116/47.  MD stated that give the amiodarone and hold the norvasc for now.  He also states to remove the NTG patch since this is what is most likely causing the head ache.  The patch was removed this am with med pass.

## 2016-10-29 NOTE — Progress Notes (Signed)
Notified MD via text patient complain of headache rates 7 of 10 and has no relief from tylenol.

## 2016-10-29 NOTE — Evaluation (Signed)
Physical Therapy Evaluation Patient Details Name: Patricia Jackson MRN: [id number] DOB: [birth year] Today's Date: 10/29/2016   History of Present Illness  Patricia Jackson is a 76yo white female who comes to [facility] with CP, SOB, nausea similat to prior MIx2 in the past 60M. PMH: AF, BrCA, CAD s/p stent, COPD. At [place] pt amb communitydistances s A/E, but has to take regular breaks d.t SOB and or bilar calf cramping.   Clinical Impression  Pt admitted with above diagnosis. Pt currently with functional limitations due to the deficits listed below (see "PT Problem List"). Pt performing all functional mobility at baseline level at this time. She amb with VSS, mild DOE as per baseline. She reports some cramping in bilat distal calves with increased amb over the past few months. She reports it to resolve with cessation of activity, denies aggravation with prolonged standing or aggravation at rest. Pt educated on common symptoms associated with vascular claudication and encouraged to see her PCP if this continues or worsens in the future. Patient is at baseline, all education completed, and time is given to address all questions/concerns. No additional skilled PT services needed at this time, PT signing off. PT recommends daily ambulation ad lib or with nursing staff as needed to prevent deconditioning.       Follow Up Recommendations No PT follow up    Equipment Recommendations  None recommended by PT    Recommendations for Other Services       Precautions / Restrictions Precautions Precautions: None      Mobility  Bed Mobility Overal bed mobility: Independent                Transfers Overall transfer level: Independent                  Ambulation/Gait Ambulation/Gait assistance: Independent Ambulation Distance (Feet): 200 Feet Assistive device: None Gait Pattern/deviations: WFL(Within Functional Limits)   Gait velocity interpretation: <1.8 ft/sec, indicative of risk for  recurrent falls General Gait Details: no LOB, mild DOE as per baseline, mild cramping in calves bilat at end of distance.   Stairs            Wheelchair Mobility    Modified Rankin (Stroke Patients Only)       Balance Overall balance assessment: Independent                                           Pertinent Vitals/Pain Pain Assessment: No/denies pain    Home Living Family/patient expects to be discharged to:: Private residence Living Arrangements: Children Available Help at Discharge: Family Type of Home: House Home Access: Stairs to enter;Ramped entrance Entrance Stairs-Rails: Right Entrance Stairs-Number of Steps: 2 Home Layout: One level Home Equipment: None      Prior Function Level of Independence: Independent               Hand Dominance   Dominant Hand: Right    Extremity/Trunk Assessment                Communication   Communication: No difficulties  Cognition Arousal/Alertness: Awake/alert Behavior During Therapy: WFL for tasks assessed/performed Overall Cognitive Status: Within Functional Limits for tasks assessed  General Comments      Exercises     Assessment/Plan    PT Assessment Patent does not need any further PT services  PT Problem List         PT Treatment Interventions      PT Goals (Current goals can be found in the Care Plan section)  Acute Rehab PT Goals PT Goal Formulation: All assessment and education complete, DC therapy    Frequency     Barriers to discharge        Co-evaluation               AM-PAC PT "6 Clicks" Daily Activity  Outcome Measure Difficulty turning over in bed (including adjusting bedclothes, sheets and blankets)?: None Difficulty moving from lying on back to sitting on the side of the bed? : None Difficulty sitting down on and standing up from a chair with arms (e.g., wheelchair, bedside commode,  etc,.)?: None Help needed moving to and from a bed to chair (including a wheelchair)?: None Help needed walking in hospital room?: A Little Help needed climbing 3-5 steps with a railing? : A Little 6 Click Score: 22    End of Session Equipment Utilized During Treatment: Gait belt Activity Tolerance: Patient tolerated treatment well Patient left: in bed Nurse Communication: Mobility status      Time: 4431-5400 PT Time Calculation (min) (ACUTE ONLY): 11 min   Charges:   PT Evaluation $PT Eval Low Complexity: 1 Procedure     PT G Codes:   PT G-Codes **NOT FOR INPATIENT CLASS** Functional Assessment Tool Used: AM-PAC 6 Clicks Basic Mobility;Clinical judgement Functional Limitation: Mobility: Walking and moving around Mobility: Walking and Moving Around Current Status (Q6761): At least 20 percent but less than 40 percent impaired, limited or restricted Mobility: Walking and Moving Around Goal Status [phone]): At least 20 percent but less than 40 percent impaired, limited or restricted Mobility: Walking and Moving Around Discharge Status [phone]): At least 20 percent but less than 40 percent impaired, limited or restricted    2:48 PM, 10/29/16 Etta Grandchild, PT, DPT Physical Therapist - [place] [phone] (Office)   Maxie Debose C 10/29/2016, 2:45 PM

## 2016-10-29 NOTE — Progress Notes (Signed)
Patient discharged with instructions, prescription, and care notes.  Verbalized understanding via teach back.  IV was removed and the site was WNL. Patient voiced no further complaints or concerns at the time of discharge.  Appointments scheduled per instructions.  Patient left the floor staff in stable condition.  Discussed in dept the usage of NTG because she had questions about the administered.  Voiced to her taking 1 tab every 5 mins and if no relief after # 3 tablet call EMS.  She verbalized understanding.

## 2016-10-29 NOTE — Plan of Care (Signed)
Problem: Activity: Goal: Ability to tolerate increased activity will improve Outcome: Progressing Pt up ad lib in room to BR. Pt has no c/o CP this shift, no c/o shortness of breath. Has c/o headache and given Tylenol x2 and Toradol x1. Will continue to monitor pt

## 2016-10-29 NOTE — Care Management Obs Status (Signed)
[place] NOTIFICATION   Patient Details  Name: Patricia Jackson MRN: [id number] Date of Birth: 1940-09-10   Medicare Observation Status Notification Given:  Yes    Sherald Barge, RN 10/29/2016, 3:22 PM

## 2016-11-10 NOTE — Discharge Summary (Signed)
[facility] Discharge Summary   Patient: Patricia Jackson DEY:[id number]   PCP: Celene Squibb, MD DOB: January 08, 1941   Date of admission: 10/28/2016   Date of discharge: 10/29/2016     Discharge Diagnoses:  Principal Problem:   Chest pain Active Problems:   Hypertension   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HCC)   Dyslipidemia   CAD in native artery   Anemia   Atrial flutter (HCC)   CKD (chronic kidney disease), stage IV (HCC)   COPD (chronic obstructive pulmonary disease) ([place])   Depression   Admitted From: home Disposition:  home  Recommendations for Outpatient Follow-up:  1. Please follow up with PCP in 1 week   Follow-up Information    Lendon Colonel, NP Follow up on 11/11/2016.   Specialties:  Nurse Practitioner, Radiology, Cardiology Why:  4 pm Contact information: [place] [address] [phone]        Celene Squibb, MD. Schedule an appointment as soon as possible for a visit in 1 week(s).   Specialty:  Internal Medicine Contact information: [place] [address] [phone]          Diet recommendation: cardiac diet  Activity: The patient is advised to gradually reintroduce usual activities.  Discharge Condition: good  Code Status: full code  History of present illness: As per the H and P dictated on admission, "Claudeen Leason is a 76 y.o. female with medical history significant of atrial flutter, remote breast cancer history, CAD of native arteries (history of angioplasty with stent on 03/15/2016,, COPD, hypertension is coming to the emergency department with complaints of chest pain.  Per patient, around 1300 today she was sitting down when she felt pains in both of her arms going to her back. She describes this pain as similar as the pressure-like pain she had last year when she had to have the coronary stent placed. She was diaphoretic, felt mildly short of breath and nauseous, but denies dizziness, palpitations, emesis,  dyspnea on exertion, recent PND, orthopnea or pitting edema of the lower extremities. She took one nitroglycerin sublingually without significant relief. There were no relieving or exacerbating factors. However, she feels that the nitro paste given in emergency department helped. She is currently chest pain-free. She denies fever, chills, sore throat, wheezing, abdominal pain, diarrhea, constipation, melena or hematochezia. She denies dysuria, frequency or hematuria. She denies polyuria, polydipsia or blurry vision. She denies skin rashes or pruritus. "  Hospital Course:  Summary of her active problems in the hospital is as following.  Chest pain No significant events on telemetry, normal serial troponin level. Repeat EKG in the morning unremarkable as well.  Echocardiogram shows no significant wall motion abnormality. Patient to follow-up as an outpatient with cardiology. Already arranged.   Active Problems:   CAD in native artery Continue clopidogrel, metoprolol and rosuvastatin.    Atrial flutter (HCC) CHA?DS?-VASc of at least 4. Currently on amiodarone 200 mg by mouth daily for rate control. Also on metoprolol succinate 12.5 mg by mouth daily. Not on anticoagulation, but only on Plavix.  She has had problems with anticoagulation before.    Hypertension Continue amlodipine 5 mg by mouth daily. Continue metoprolol succinate 12.5 mg by mouth daily. Monitor blood pressure.    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HCC)   COPD (chronic obstructive pulmonary disease) ([place]) The patient is on amiodarone 200 mg by mouth daily. Continues Symbicort 1 inhalation daily or formulary equivalent. Continue Incruse Ellipta  1 inhalation daily. Supplemental oxygen and bronchodilators  as needed.    Dyslipidemia Continue Crestor 20 mg by mouth daily. Fasting lipid monitoring as an outpatient.    Depression Continue Wellbutrin XL 300 mg by mouth daily.    Anemia Earlier this year, anemia profile showed  low iron with normal ferritin and TIBC. Continue iron supplementation.    CKD (chronic kidney disease), stage IV ([place]) Patient's most recent creatinine was 1.56 at about 5+ weeks ago.  All other chronic medical condition were stable during the hospitalization.  Patient was seen by physical therapy, who recommended no PT follow up needed. On the day of the discharge the patient's vitals were stable, and no other acute medical condition were reported by patient. the patient was felt safe to be discharge at home with family.  Procedures and Results:  Echocardiogram   Consultations:  none  DISCHARGE MEDICATION: Discharge Medication List as of 10/29/2016  5:56 PM    START taking these medications   Details  butalbital-acetaminophen-caffeine (FIORICET, ESGIC) 50-325-40 MG tablet Take 1 tablet by mouth every 4 (four) hours as needed for headache., Starting Fri 10/29/2016, Print      CONTINUE these medications which have NOT CHANGED   Details  acetaminophen (TYLENOL) 500 MG tablet Take 500 mg by mouth every 6 (six) hours as needed for mild pain, moderate pain, fever or headache. , Historical Med    albuterol (PROVENTIL HFA;VENTOLIN HFA) 108 (90 Base) MCG/ACT inhaler Inhale 2 puffs into the lungs every 6 (six) hours as needed for wheezing or shortness of breath., Historical Med    albuterol (PROVENTIL) (2.5 MG/3ML) 0.083% nebulizer solution Take 3 mLs (2.5 mg total) by nebulization every 6 (six) hours as needed for wheezing or shortness of breath., Starting Sat 11/08/2015, Print    amiodarone (PACERONE) 200 MG tablet Take 1 tablet (200 mg total) by mouth daily., Starting Tue 05/18/2016, Normal    buPROPion (WELLBUTRIN XL) 300 MG 24 hr tablet Take 300 mg by mouth daily. , Historical Med    clopidogrel (PLAVIX) 75 MG tablet Take 1 tablet (75 mg total) by mouth daily., Starting Tue 04/06/2016, Normal    diphenhydrAMINE (BENADRYL) 25 MG tablet Take 50 mg by mouth at bedtime as needed for sleep.,  Historical Med    ferrous sulfate (FERROUSUL) 325 (65 FE) MG tablet Take 1 tablet (325 mg total) by mouth daily with breakfast., Starting Thu 04/08/2016, Print    levocetirizine (XYZAL) 5 MG tablet Take 5 mg by mouth at bedtime. , Historical Med    nitroGLYCERIN (NITROSTAT) 0.4 MG SL tablet Place 0.4 mg under the tongue every 5 (five) minutes as needed for chest pain., Historical Med    pantoprazole (PROTONIX) 20 MG tablet Take 20 mg by mouth daily. , Historical Med    rosuvastatin (CRESTOR) 20 MG tablet Take 20 mg by mouth daily. , Historical Med    SYMBICORT 160-4.5 MCG/ACT inhaler Inhale 1 puff into the lungs daily., Starting Mon 02/16/2016, Normal    umeclidinium bromide (INCRUSE ELLIPTA) 62.5 MCG/INH AEPB Inhale 1 puff into the lungs daily., Historical Med      STOP taking these medications     amLODipine (NORVASC) 5 MG tablet      metoprolol succinate (TOPROL-XL) 25 MG 24 hr tablet        Allergies  Allergen Reactions  . Sulfa Antibiotics Hives   Discharge Instructions    Diet - low sodium heart healthy    Complete by:  As directed    Discharge instructions    Complete by:  As directed    It is important that you read following instructions as well as go over your medication list with RN to help you understand your care after this hospitalization.  Discharge Instructions: Please follow-up with PCP in one week  Please request your primary care physician to go over all Hospital Tests and Procedure/Radiological results at the follow up,  Please get all Hospital records sent to your PCP by signing hospital release before you go home.   Do not take more than prescribed Pain, Sleep and Anxiety Medications. You were cared for by a hospitalist during your hospital stay. If you have any questions about your discharge medications or the care you received while you were in the hospital after you are discharged, you can call the unit and ask to speak with the hospitalist on call if  the hospitalist that took care of you is not available.  Once you are discharged, your primary care physician will handle any further medical issues. Please note that NO REFILLS for any discharge medications will be authorized once you are discharged, as it is imperative that you return to your primary care physician (or establish a relationship with a primary care physician if you do not have one) for your aftercare needs so that they can reassess your need for medications and monitor your lab values. You Must read complete instructions/literature along with all the possible adverse reactions/side effects for all the Medicines you take and that have been prescribed to you. Take any new Medicines after you have completely understood and accept all the possible adverse reactions/side effects. Wear Seat belts while driving. If you have smoked or chewed Tobacco in the last 2 yrs please stop smoking and/or stop any Recreational drug use.   Increase activity slowly    Complete by:  As directed      Discharge Exam: Filed Weights   10/28/16 1400 10/28/16 1737  Weight: 61.2 kg (135 lb) 64.8 kg (142 lb 14.4 oz)   Vitals:   10/29/16 1439 10/29/16 1629  BP:    Pulse: 60   Resp:  18  Temp:  98.1 F (36.7 C)   General: Appear in no distress, no Rash; Oral Mucosa moist. Cardiovascular: S1 and S2 Present, no Murmur, no JVD Respiratory: Bilateral Air entry present and Clear to Auscultation, no Crackles, no wheezes Abdomen: Bowel Sound present, Soft and no tenderness Extremities: no Pedal edema, no calf tenderness Neurology: Grossly no focal neuro deficit.  The results of significant diagnostics from this hospitalization (including imaging, microbiology, ancillary and laboratory) are listed below for reference.    Significant Diagnostic Studies: Dg Chest 2 View  Result Date: 10/28/2016 CLINICAL DATA:  Pain both shoulders.  Breast cancer. EXAM: CHEST  2 VIEW COMPARISON:  Chest x-ray 09/19/2016.  FINDINGS: Mediastinum and hilar structures normal. Lungs are clear. No pleural effusion or pneumothorax . Bilateral pleuroparenchymal thickening consistent with scarring. COPD. Surgical clips left chest . Left mastectomy. Diffuse osteopenia and degenerative change. No acute bony abnormality. IMPRESSION: 1. Bilateral pleural-parenchymal scarring. COPD. No acute pulmonary infiltrate. 2. Left mastectomy. Electronically Signed   By: Marcello Moores  Register   On: 10/28/2016 15:30    Microbiology: No results found for this or any previous visit (from the past 240 hour(s)).   Labs: CBC: No results for input(s): WBC, NEUTROABS, HGB, HCT, MCV, PLT in the last 168 hours. Basic Metabolic Panel: No results for input(s): NA, K, CL, CO2, GLUCOSE, BUN, CREATININE, CALCIUM, MG, PHOS in the last 168 hours. Liver  Function Tests: No results for input(s): AST, ALT, ALKPHOS, BILITOT, PROT, ALBUMIN in the last 168 hours. No results for input(s): LIPASE, AMYLASE in the last 168 hours. No results for input(s): AMMONIA in the last 168 hours. Cardiac Enzymes: No results for input(s): CKTOTAL, CKMB, CKMBINDEX, TROPONINI in the last 168 hours. BNP (last 3 results)  Recent Labs  04/03/16 1351 09/08/16 1248  BNP 177.0* 143.0*   CBG: No results for input(s): GLUCAP in the last 168 hours. Time spent: 35 minutes  Signed:  Berle Mull  [facility] 10/29/2016 , 12:20 PM

## 2016-11-12 ENCOUNTER — Encounter: Payer: Self-pay | Admitting: Adult Health

## 2016-11-12 ENCOUNTER — Ambulatory Visit (INDEPENDENT_AMBULATORY_CARE_PROVIDER_SITE_OTHER): Payer: Medicare Other | Admitting: Adult Health

## 2016-11-12 VITALS — BP 142/72 | HR 75 | Ht 67.0 in | Wt 141.0 lb

## 2016-11-12 DIAGNOSIS — I10 Essential (primary) hypertension: Secondary | ICD-10-CM

## 2016-11-12 DIAGNOSIS — I2510 Atherosclerotic heart disease of native coronary artery without angina pectoris: Secondary | ICD-10-CM

## 2016-11-12 DIAGNOSIS — R0602 Shortness of breath: Secondary | ICD-10-CM | POA: Diagnosis not present

## 2016-11-12 DIAGNOSIS — Z6822 Body mass index (BMI) 22.0-22.9, adult: Secondary | ICD-10-CM | POA: Diagnosis not present

## 2016-11-12 DIAGNOSIS — J449 Chronic obstructive pulmonary disease, unspecified: Secondary | ICD-10-CM | POA: Diagnosis not present

## 2016-11-12 DIAGNOSIS — R944 Abnormal results of kidney function studies: Secondary | ICD-10-CM | POA: Diagnosis not present

## 2016-11-12 DIAGNOSIS — I4892 Unspecified atrial flutter: Secondary | ICD-10-CM | POA: Diagnosis not present

## 2016-11-12 DIAGNOSIS — I482 Chronic atrial fibrillation: Secondary | ICD-10-CM | POA: Diagnosis not present

## 2016-11-12 DIAGNOSIS — Z720 Tobacco use: Secondary | ICD-10-CM | POA: Diagnosis not present

## 2016-11-12 DIAGNOSIS — N183 Chronic kidney disease, stage 3 (moderate): Secondary | ICD-10-CM | POA: Diagnosis not present

## 2016-11-12 MED ORDER — AMLODIPINE BESYLATE 5 MG PO TABS: 5.0000 mg | ORAL_TABLET | Freq: Every day | ORAL | 3 refills | Status: DC

## 2016-11-12 MED ORDER — TRAMADOL HCL 50 MG PO TABS: 50.0000 mg | ORAL_TABLET | Freq: Three times a day (TID) | ORAL | 0 refills | Status: DC | PRN

## 2016-11-12 NOTE — Progress Notes (Signed)
Cardiology Office Note   Date:  11/12/2016   ID:  Koa Palla, DOB [birth year], MRN [id number]  PCP:  Celene Squibb, MD  Cardiologist:  [facility] Chief Complaint  Patient presents with  . Coronary Artery Disease  . Atrial Fibrillation      History of Present Illness: Patricia Jackson is a 76 y.o. female who presents for ongoing assessment and management of CAD, history of DES to the proximal circumflex and mid circumflex in December 2017, other history includes atrial flutter, COPD, and breast cancer. She also has iron deficiency anemia. She was recently hospitalized and given 2 units of red blood cells for symptomatic anemia, Brilinta and ELIQUIS were discontinued she was started on 75 mg of Plavix daily.  She was last seen in the office on 04/20/2016, and had no cardiac complaints. Blood pressure was well-controlled she had no evidence of bleeding. At that office visit she was bradycardic and her metoprolol was decreased from 50 mg XL daily to 25 mg XL daily,   She comes today with chronic dyspnea on exertion, appears to be worse for her.  O2 saturation is 98% here in the office but she is easily dyspneic when speaking.  She also complains of chronic headache pain.   She was seen in the emergency room approximate one week ago complaining of chest pain. It was noncardiac in etiology and felt be musculoskeletal.  . Past Medical History:  Diagnosis Date  . Atrial flutter ([employer])    On Eliquis in 8/17 but discontinued after stent placement  . Breast cancer ([place])    remote  . CAD in native artery 03/16/2016  . COPD (chronic obstructive pulmonary disease) ([place])   . Hypertension   . S/P angioplasty with stent 03/15/16 DES Resolute, [place] 03/16/2016    Past Surgical History:  Procedure Laterality Date  . APPENDECTOMY    . BREAST SURGERY    . CARDIAC CATHETERIZATION N/A 03/15/2016   Procedure: Left Heart Cath and Coronary Angiography;  Surgeon: Belva Crome, MD;  Location: [facility];  Service: Cardiovascular;  Laterality: N/A;  . CARDIAC CATHETERIZATION N/A 03/15/2016   Procedure: Coronary Stent Intervention;  Surgeon: Belva Crome, MD;  Location: [facility];  Service: Cardiovascular;  Laterality: N/A;  . COLONOSCOPY     remote  . COLONOSCOPY N/A 05/26/2016   Procedure: COLONOSCOPY;  Surgeon: Daneil Dolin, MD;  [facility];  Service: Endoscopy;  Laterality: N/A;  845  . ESOPHAGOGASTRODUODENOSCOPY N/A 05/26/2016   Procedure: ESOPHAGOGASTRODUODENOSCOPY (EGD);  Surgeon: Daneil Dolin, MD;  [facility];  Service: Endoscopy;  Laterality: N/A;  Venia Minks DILATION N/A 05/26/2016   Procedure: Venia Minks DILATION;  Surgeon: Daneil Dolin, MD;  [facility];  Service: Endoscopy;  Laterality: N/A;     Current Outpatient Prescriptions  Medication Sig Dispense Refill  . acetaminophen (TYLENOL) 500 MG tablet Take 500 mg by mouth every 6 (six) hours as needed for mild pain, moderate pain, fever or headache.     . albuterol (PROVENTIL HFA;VENTOLIN HFA) 108 (90 Base) MCG/ACT inhaler Inhale 2 puffs into the lungs every 6 (six) hours as needed for wheezing or shortness of breath.    Marland Kitchen albuterol (PROVENTIL) (2.5 MG/3ML) 0.083% nebulizer solution Take 3 mLs (2.5 mg total) by nebulization every 6 (six) hours as needed for wheezing or shortness of breath. 75 mL 12  . buPROPion (WELLBUTRIN XL) 300 MG 24 hr tablet Take 300 mg by mouth daily.     Marland Kitchen  clopidogrel (PLAVIX) 75 MG tablet Take 1 tablet (75 mg total) by mouth daily. 90 tablet 3  . diphenhydrAMINE (BENADRYL) 25 MG tablet Take 50 mg by mouth at bedtime as needed for sleep.    . ferrous sulfate (FERROUSUL) 325 (65 FE) MG tablet Take 1 tablet (325 mg total) by mouth daily with breakfast. 30 tablet 0  . levocetirizine (XYZAL) 5 MG tablet Take 5 mg by mouth at bedtime.     . nitroGLYCERIN (NITROSTAT) 0.4 MG SL tablet Place 0.4 mg under the tongue every 5 (five) minutes as needed for chest pain.     . pantoprazole (PROTONIX) 20 MG tablet Take 20 mg by mouth daily.     . rosuvastatin (CRESTOR) 20 MG tablet Take 20 mg by mouth daily.     . SYMBICORT 160-4.5 MCG/ACT inhaler Inhale 1 puff into the lungs daily. 1 Inhaler 5  . umeclidinium bromide (INCRUSE ELLIPTA) 62.5 MCG/INH AEPB Inhale 1 puff into the lungs daily.    Marland Kitchen amLODipine (NORVASC) 5 MG tablet Take 1 tablet (5 mg total) by mouth daily. 90 tablet 3  . traMADol (ULTRAM) 50 MG tablet Take 1 tablet (50 mg total) by mouth every 8 (eight) hours as needed. 30 tablet 0   No current facility-administered medications for this visit.     Allergies:   Sulfa antibiotics    Social History:  The patient  reports that she quit smoking about 12 months ago. Her smoking use included Cigarettes. She started smoking about 50 years ago. She has a 56.00 pack-year smoking history. She has never used smokeless tobacco. She reports that she does not drink alcohol or use drugs.   Family History:  The patient's family history includes COPD in her father; Cancer in her brother and sister; Heart disease in her mother.    ROS: All other systems are reviewed and negative. Unless otherwise mentioned in H&P    PHYSICAL EXAM: VS:  BP (!) 142/72 (BP Location: Right Arm)   Pulse 75   Ht _0  (1.702 m)   Wt 141 lb (64 kg)   SpO2 95%   BMI 22.08 kg/m  , BMI Body mass index is 22.08 kg/m. GEN: Well nourished, well developed, in no acute distress  HEENT: normal  Neck: no JVD, carotid bruits, or masses Cardiac: RRR; no murmurs, rubs, or gallops,no edema  Respiratory:   Clear to auscultation but easily dyspneic , no wheezes or rhonchi. No coughing. GI: soft, nontender, nondistended, + BS MS: no deformity or atrophy  Skin: warm and dry, no rash Neuro:  Strength and sensation are intact Psych: euthymic mood, flat affect   Recent Labs: 04/07/2016: TSH 4.978 09/08/2016: ALT 54; B Natriuretic Peptide 143.0 10/28/2016: Hemoglobin 11.4; Magnesium 2.2; Platelets  170 10/30/2016: BUN 36; Creatinine, Ser 1.88; Potassium 4.5; Sodium 138    Lipid Panel    Component Value Date/Time   CHOL 159 03/13/2016 0324   TRIG 63 03/13/2016 0324   HDL 70 03/13/2016 0324   CHOLHDL 2.3 03/13/2016 0324   VLDL 13 03/13/2016 0324   LDLCALC 76 03/13/2016 0324      Wt Readings from Last 3 Encounters:  11/12/16 141 lb (64 kg)  10/28/16 142 lb 14.4 oz (64.8 kg)  09/19/16 134 lb (60.8 kg)      Other studies Reviewed: Echocardiogram 2016-10-30  Left ventricle: The cavity size was normal. Wall thickness was   normal. Systolic function was normal. The estimated ejection   fraction was in the range of  60% to 65%. Wall motion was normal;   there were no regional wall motion abnormalities. Features are   consistent with a pseudonormal left ventricular filling pattern,   with concomitant abnormal relaxation and increased filling   pressure (grade 2 diastolic dysfunction). - Aortic valve: Mildly calcified annulus. Trileaflet. - Mitral valve: Calcified annulus. There was mild regurgitation. - Right atrium: Central venous pressure (est): 3 mm Hg. - Tricuspid valve: There was trivial regurgitation. - Pulmonary arteries: PA peak pressure: 27 mm Hg (S). - Pericardium, extracardiac: There was no pericardial effusion.  Cardiac Cath 03/15/2016  Conclusion     The left ventricular ejection fraction is 55-65% by visual estimate.  The left ventricular systolic function is normal.  A STENT RESOLUTE ONYX 2.75X12 drug eluting stent was successfully placed, and does not overlap previously placed stent.  Prox Cx to Mid Cx lesion, 99 %stenosed.  Post intervention, there is a 0% residual stenosis.    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  Otherwise widely patent coronary arteries  Successful angioplasty and stenting of the proximal circumflex reducing a greater than 95% stenosis to 0% with TIMI grade 3 flow. Resolute Onyx 2.75 x  12 mm stent deployed at 14 atm.     ASSESSMENT AND PLAN:  1. Coronary artery disease:  Essentially asymptomatic. She is very sensitive to any aches and pains. She occasionally has "sticking pain" which is very transient lasting a couple seconds at a time.  I'll not make any medication changes. She will continue Plavix, statin therapy.  2,  COPD:  She has chronic shortness of breath and dyspnea  on exertion , she states it is becoming worse.  I'm going to stop amiodarone, as she's had no recurrence of rapid heart rhythm  And has been taken off of ELIQUIS. The patient had  Atrial fibrillation /flutter and was placed on amiodarone at that time. It will take a while for the amiodarone to leave her system. I have advised her if she feels her heart racing she is to call us.   3. Hypertension: BP is elevated currently. With discontinuation of the amiodarone, I will start her on amlodipine 5 mg daily. She may have some improvement in her breathing status with possible reduction in pulmonary hypertensin with use of amlodipine.   3. Hx of Anemia: Current CBC is improved.   4. Chronic Headache Pain: I have advised her not to take Fioricet for pain as this contains caffeine and may cause chest pain. I have given her Rx for tramadol 50 mg, #30 with NO REFILLS. She will need to see PCP for ongoing refills if they feel this is helpful to her.       Current medicines are reviewed at length with the patient today.    Labs/ tests ordered today include:  Phill Myron. [place] Pugh, ANP, AACC   11/12/2016 4:28 PM    [facility] [address] [place], [place] [address] Phone: [phone]; Fax: [phone]

## 2016-11-12 NOTE — Patient Instructions (Addendum)
Medication Instructions:   Stop amiodarone.  Start amlodipine 5 mg by mouth daily.  Start tramadol 50 mg by mouth every 8 hours as needed for pain. No refills to be given. Please request future refills from your family doctor.  Continue all other medications the same.  Labwork:  NONE  Testing/Procedures:  NONE  Follow-Up:  Your physician recommends that you schedule a follow-up appointment in: 6 months. You will receive a reminder letter in the mail in about 4 months reminding you to call and schedule your appointment. If you don't receive this letter, please contact our office.  Any Other Special Instructions Will Be Listed Below (If Applicable).  If you need a refill on your cardiac medications before your next appointment, please call your pharmacy.

## 2016-11-20 ENCOUNTER — Other Ambulatory Visit: Payer: Self-pay | Admitting: Internal Medicine

## 2016-11-23 ENCOUNTER — Other Ambulatory Visit: Payer: Self-pay

## 2016-11-23 NOTE — Telephone Encounter (Signed)
Pharmacy sent a fax requesting 90 day supply.

## 2016-12-21 ENCOUNTER — Ambulatory Visit: Payer: Medicare Other | Admitting: Pulmonary Disease

## 2017-02-15 ENCOUNTER — Encounter (HOSPITAL_COMMUNITY): Payer: Medicare Other

## 2017-02-15 ENCOUNTER — Ambulatory Visit: Payer: Medicare Other | Admitting: Family

## 2017-03-09 ENCOUNTER — Ambulatory Visit: Payer: Medicare Other | Admitting: Gastroenterology

## 2017-03-21 DIAGNOSIS — Z961 Presence of intraocular lens: Secondary | ICD-10-CM | POA: Diagnosis not present

## 2017-03-21 DIAGNOSIS — H353121 Nonexudative age-related macular degeneration, left eye, early dry stage: Secondary | ICD-10-CM | POA: Diagnosis not present

## 2017-03-23 DIAGNOSIS — R05 Cough: Secondary | ICD-10-CM | POA: Diagnosis not present

## 2017-03-23 DIAGNOSIS — J441 Chronic obstructive pulmonary disease with (acute) exacerbation: Secondary | ICD-10-CM | POA: Diagnosis not present

## 2017-03-23 DIAGNOSIS — R070 Pain in throat: Secondary | ICD-10-CM | POA: Diagnosis not present

## 2017-04-04 DIAGNOSIS — H353221 Exudative age-related macular degeneration, left eye, with active choroidal neovascularization: Secondary | ICD-10-CM | POA: Diagnosis not present

## 2017-04-04 DIAGNOSIS — H3562 Retinal hemorrhage, left eye: Secondary | ICD-10-CM | POA: Diagnosis not present

## 2017-04-04 DIAGNOSIS — H35022 Exudative retinopathy, left eye: Secondary | ICD-10-CM | POA: Diagnosis not present

## 2017-04-04 DIAGNOSIS — H353124 Nonexudative age-related macular degeneration, left eye, advanced atrophic with subfoveal involvement: Secondary | ICD-10-CM | POA: Diagnosis not present

## 2017-04-06 DIAGNOSIS — R7301 Impaired fasting glucose: Secondary | ICD-10-CM | POA: Diagnosis not present

## 2017-04-06 DIAGNOSIS — I10 Essential (primary) hypertension: Secondary | ICD-10-CM | POA: Diagnosis not present

## 2017-04-06 DIAGNOSIS — R944 Abnormal results of kidney function studies: Secondary | ICD-10-CM | POA: Diagnosis not present

## 2017-04-06 DIAGNOSIS — E782 Mixed hyperlipidemia: Secondary | ICD-10-CM | POA: Diagnosis not present

## 2017-04-06 DIAGNOSIS — D509 Iron deficiency anemia, unspecified: Secondary | ICD-10-CM | POA: Diagnosis not present

## 2017-04-07 DIAGNOSIS — H353221 Exudative age-related macular degeneration, left eye, with active choroidal neovascularization: Secondary | ICD-10-CM | POA: Diagnosis not present

## 2017-04-07 DIAGNOSIS — H35022 Exudative retinopathy, left eye: Secondary | ICD-10-CM | POA: Diagnosis not present

## 2017-04-08 ENCOUNTER — Telehealth: Payer: Self-pay | Admitting: Student

## 2017-04-08 DIAGNOSIS — N182 Chronic kidney disease, stage 2 (mild): Secondary | ICD-10-CM | POA: Diagnosis not present

## 2017-04-08 DIAGNOSIS — E782 Mixed hyperlipidemia: Secondary | ICD-10-CM | POA: Diagnosis not present

## 2017-04-08 DIAGNOSIS — Z0000 Encounter for general adult medical examination without abnormal findings: Secondary | ICD-10-CM | POA: Diagnosis not present

## 2017-04-08 NOTE — Telephone Encounter (Signed)
Patricia Jackson needed to clarify patient is off amiodarone , as diflucan was ordered by pcp, which is correct - pt is off amiodarone

## 2017-04-08 NOTE — Telephone Encounter (Signed)
Please call Tammy @ [facility] concerning pt's medication

## 2017-04-08 NOTE — Telephone Encounter (Signed)
Returned Tammy's call. Was on hold for a while, so I hung up. I will return call later.

## 2017-04-18 ENCOUNTER — Ambulatory Visit: Payer: Medicare Other | Admitting: Pulmonary Disease

## 2017-04-19 ENCOUNTER — Ambulatory Visit: Payer: Medicare Other | Admitting: Family

## 2017-04-19 ENCOUNTER — Encounter (HOSPITAL_COMMUNITY): Payer: Medicare Other

## 2017-05-15 DIAGNOSIS — J441 Chronic obstructive pulmonary disease with (acute) exacerbation: Secondary | ICD-10-CM | POA: Diagnosis not present

## 2017-05-19 ENCOUNTER — Emergency Department (HOSPITAL_COMMUNITY)
Admission: EM | Admit: 2017-05-19 | Discharge: 2017-05-19 | Disposition: A | Discharge status: Home / Self Care | Payer: Medicare Other | Attending: Emergency Medicine | Admitting: Emergency Medicine

## 2017-05-19 ENCOUNTER — Emergency Department (HOSPITAL_COMMUNITY): Payer: Medicare Other

## 2017-05-19 ENCOUNTER — Other Ambulatory Visit: Payer: Self-pay

## 2017-05-19 ENCOUNTER — Encounter (HOSPITAL_COMMUNITY): Payer: Self-pay | Admitting: Emergency Medicine

## 2017-05-19 DIAGNOSIS — Z853 Personal history of malignant neoplasm of breast: Secondary | ICD-10-CM | POA: Insufficient documentation

## 2017-05-19 DIAGNOSIS — J441 Chronic obstructive pulmonary disease with (acute) exacerbation: Secondary | ICD-10-CM

## 2017-05-19 DIAGNOSIS — R05 Cough: Secondary | ICD-10-CM | POA: Diagnosis present

## 2017-05-19 DIAGNOSIS — Z79899 Other long term (current) drug therapy: Secondary | ICD-10-CM | POA: Insufficient documentation

## 2017-05-19 DIAGNOSIS — N183 Chronic kidney disease, stage 3 (moderate): Secondary | ICD-10-CM | POA: Insufficient documentation

## 2017-05-19 DIAGNOSIS — I129 Hypertensive chronic kidney disease with stage 1 through stage 4 chronic kidney disease, or unspecified chronic kidney disease: Secondary | ICD-10-CM | POA: Insufficient documentation

## 2017-05-19 DIAGNOSIS — I252 Old myocardial infarction: Secondary | ICD-10-CM | POA: Insufficient documentation

## 2017-05-19 DIAGNOSIS — Z87891 Personal history of nicotine dependence: Secondary | ICD-10-CM | POA: Diagnosis not present

## 2017-05-19 DIAGNOSIS — I2510 Atherosclerotic heart disease of native coronary artery without angina pectoris: Secondary | ICD-10-CM | POA: Insufficient documentation

## 2017-05-19 DIAGNOSIS — R0602 Shortness of breath: Secondary | ICD-10-CM | POA: Diagnosis not present

## 2017-05-19 MED ORDER — AZITHROMYCIN 250 MG PO TABS: 250.0000 mg | ORAL_TABLET | Freq: Every day | ORAL | 0 refills | Status: DC

## 2017-05-19 MED ORDER — HYDROCOD POLST-CPM POLST ER 10-8 MG/5ML PO SUER
5.0000 mL | Freq: Once | ORAL | Status: AC
  Administered 2017-05-19: 5 mL via ORAL
  Filled 2017-05-19: qty 5

## 2017-05-19 MED ORDER — PREDNISONE 10 MG PO TABS: 10.0000 mg | ORAL_TABLET | Freq: Every day | ORAL | 0 refills | Status: DC

## 2017-05-19 MED ORDER — IBUPROFEN 800 MG PO TABS
800.0000 mg | ORAL_TABLET | Freq: Once | ORAL | Status: AC
  Administered 2017-05-19: 800 mg via ORAL
  Filled 2017-05-19: qty 1

## 2017-05-19 MED ORDER — ACETAMINOPHEN 500 MG PO TABS
1000.0000 mg | ORAL_TABLET | Freq: Once | ORAL | Status: AC
  Administered 2017-05-19: 1000 mg via ORAL
  Filled 2017-05-19: qty 2

## 2017-05-19 MED ORDER — IPRATROPIUM-ALBUTEROL 0.5-2.5 (3) MG/3ML IN SOLN
3.0000 mL | Freq: Once | RESPIRATORY_TRACT | Status: AC
  Administered 2017-05-19: 3 mL via RESPIRATORY_TRACT
  Filled 2017-05-19: qty 3

## 2017-05-19 MED ORDER — HYDROCOD POLST-CPM POLST ER 10-8 MG/5ML PO SUER: 5.0000 mL | Freq: Two times a day (BID) | ORAL | 0 refills | Status: DC | PRN

## 2017-05-19 MED ORDER — DEXAMETHASONE SODIUM PHOSPHATE 4 MG/ML IJ SOLN
8.0000 mg | Freq: Once | INTRAMUSCULAR | Status: AC
  Administered 2017-05-19: 8 mg via INTRAMUSCULAR
  Filled 2017-05-19: qty 2

## 2017-05-19 NOTE — Discharge Instructions (Signed)
Chest x-ray showed no pneumonia.  No smoking.  Prescription for new antibiotic, prednisone, cough syrup.  Increase fluids.  Follow-up with your primary care doctor.

## 2017-05-19 NOTE — ED Notes (Signed)
Pt tolerated and PASSED Fluid Challenge and Crackers without issue

## 2017-05-19 NOTE — ED Triage Notes (Signed)
PT c/o cough and SOB on exertion not relieved by Doxycycline, prednisone and hydrocodone syrup prescribed on 05/15/17 at Dr. Juel Burrow urgent care.

## 2017-05-19 NOTE — ED Provider Notes (Signed)
[facility] EMERGENCY DEPARTMENT Provider Note   CSN: [id number] Arrival date & time: 05/19/17  1554     History   Chief Complaint Chief Complaint  Patient presents with  . Cough    HPI Patricia Jackson is a 77 y.o. female.  Patient with a known history of COPD presents with persistent cough for 1 month.  She has been seen by her primary care doctor x2 in the past few weeks.  She is presently finishing a prescription for doxycycline, prednisone, hydrocodone syrup.  She smokes intermittently.  No fever, sweats, chills, rusty sputum.  She uses a nebulizer machine at home approximately 3 times a day.  Severity of symptoms is moderate.  Smoking makes symptoms worse.      Past Medical History:  Diagnosis Date  . Atrial flutter ([place])    On Eliquis in 8/17 but discontinued after stent placement  . Breast cancer ([place])    remote  . CAD in native artery 03/16/2016  . COPD (chronic obstructive pulmonary disease) ([place])   . Hypertension   . S/P angioplasty with stent 03/15/16 DES Resolute, pLCX 03/16/2016    Patient Active Problem List   Diagnosis Date Noted  . Atrial flutter ([place]) 10/28/2016  . CKD (chronic kidney disease), stage IV ([place]) 10/28/2016  . COPD (chronic obstructive pulmonary disease) ([place]) 10/28/2016  . Depression 10/28/2016  . AKI (acute kidney injury) ([place]) 09/08/2016  . Hyperglycemia 09/08/2016  . Esophageal dysphagia   . Anemia 04/07/2016  . Sinus bradycardia   . Hx of heart artery stent   . CAD in native artery 03/16/2016  . S/P angioplasty with stent 03/15/16 DES Resolute, [place] 03/16/2016  . Acute coronary syndrome ([place])   . NSTEMI (non-ST elevated myocardial infarction) ([place]) 03/12/2016  . Shortness of breath   . Acute respiratory failure with hypoxia ([place])   . Hypophosphatemia   . 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[place]) 10/31/2015  . Acute exacerbation of chronic bronchitis ([place]) 10/31/2015  . COPD exacerbation ([place]) 10/31/2015  . Dyslipidemia 10/31/2015  .  Elevated troponin 10/31/2015  . Atrial flutter with rapid ventricular response ([place]) 10/30/2015  . SOB (shortness of breath) 11/26/2013  . Back pain 11/26/2013  . Arm pain 11/26/2013  . Chest pain 11/26/2013  . Hypertension     Past Surgical History:  Procedure Laterality Date  . APPENDECTOMY    . BREAST SURGERY    . CARDIAC CATHETERIZATION N/A 03/15/2016   Procedure: Left Heart Cath and Coronary Angiography;  Surgeon: Belva Crome, MD;  Location: [place] CV LAB;  Service: Cardiovascular;  Laterality: N/A;  . CARDIAC CATHETERIZATION N/A 03/15/2016   Procedure: Coronary Stent Intervention;  Surgeon: Belva Crome, MD;  Location: [place] CV LAB;  Service: Cardiovascular;  Laterality: N/A;  . COLONOSCOPY     remote  . COLONOSCOPY N/A 05/26/2016   Procedure: COLONOSCOPY;  Surgeon: Daneil Dolin, MD;  [facility];  Service: Endoscopy;  Laterality: N/A;  845  . ESOPHAGOGASTRODUODENOSCOPY N/A 05/26/2016   Procedure: ESOPHAGOGASTRODUODENOSCOPY (EGD);  Surgeon: Daneil Dolin, MD;  [facility];  Service: Endoscopy;  Laterality: N/A;  Venia Minks DILATION N/A 05/26/2016   Procedure: Venia Minks DILATION;  Surgeon: Daneil Dolin, MD;  [facility];  Service: Endoscopy;  Laterality: N/A;    OB History    Gravida Para Term Preterm AB Living             1   SAB TAB Ectopic Multiple Live Births  Home Medications    Prior to Admission medications   Medication Sig Start Date End Date Taking? Authorizing Provider  acetaminophen (TYLENOL) 500 MG tablet Take 500 mg by mouth every 6 (six) hours as needed for mild pain, moderate pain, fever or headache.     [provider]  albuterol (PROVENTIL HFA;VENTOLIN HFA) 108 (90 Base) MCG/ACT inhaler Inhale 2 puffs into the lungs every 6 (six) hours as needed for wheezing or shortness of breath.    [provider]  albuterol (PROVENTIL) (2.5 MG/3ML) 0.083% nebulizer solution Take 3  mLs (2.5 mg total) by nebulization every 6 (six) hours as needed for wheezing or shortness of breath. 11/08/15   Almyra Deforest, PA  amLODipine (NORVASC) 5 MG tablet Take 1 tablet (5 mg total) by mouth daily. 11/12/16 02/10/17  Lendon Colonel, NP  azithromycin (ZITHROMAX) 250 MG tablet Take 1 tablet (250 mg total) by mouth daily. Take first 2 tablets together, then 1 every day until finished. 05/19/17   Nat Christen, MD  buPROPion (WELLBUTRIN XL) 300 MG 24 hr tablet Take 300 mg by mouth daily.     [provider]  chlorpheniramine-HYDROcodone (TUSSIONEX PENNKINETIC ER) 10-8 MG/5ML SUER Take 5 mLs by mouth every 12 (twelve) hours as needed for cough. 05/19/17   Nat Christen, MD  clopidogrel (PLAVIX) 75 MG tablet Take 1 tablet (75 mg total) by mouth daily. 04/06/16   Lendon Colonel, NP  diphenhydrAMINE (BENADRYL) 25 MG tablet Take 50 mg by mouth at bedtime as needed for sleep.    [provider]  ferrous sulfate (FERROUSUL) 325 (65 FE) MG tablet Take 1 tablet (325 mg total) by mouth daily with breakfast. 04/08/16   Rosita Fire, MD  levocetirizine (XYZAL) 5 MG tablet Take 5 mg by mouth at bedtime.     [provider]  nitroGLYCERIN (NITROSTAT) 0.4 MG SL tablet Place 0.4 mg under the tongue every 5 (five) minutes as needed for chest pain.    [provider]  pantoprazole (PROTONIX) 20 MG tablet TAKE ONE (1) TABLET EACH DAY 11/23/16   Carlis Stable, NP  predniSONE (DELTASONE) 10 MG tablet Take 1 tablet (10 mg total) by mouth daily with breakfast. 3 tablets for 4 days, 2 tablets for 4 days, 1 tablet for 4 days 05/19/17   Nat Christen, MD  rosuvastatin (CRESTOR) 20 MG tablet Take 20 mg by mouth daily.     [provider]  SYMBICORT 160-4.5 MCG/ACT inhaler Inhale 1 puff into the lungs daily. 02/16/16   Mannam, Hart Robinsons, MD  traMADol (ULTRAM) 50 MG tablet Take 1 tablet (50 mg total) by mouth every 8 (eight) hours as needed. 11/12/16   Lendon Colonel, NP    umeclidinium bromide (INCRUSE ELLIPTA) 62.5 MCG/INH AEPB Inhale 1 puff into the lungs daily.    [provider]    Family History Family History  Problem Relation Age of Onset  . Heart disease Mother   . COPD Father   . Cancer Sister        unknown primary  . Cancer Brother        unknown primary  . Colon cancer Neg Hx     Social History Social History   Tobacco Use  . Smoking status: Former Smoker    Packs/day: 1.00    Years: 56.00    Pack years: 56.00    Types: Cigarettes    Start date: 12/15/1965    Last attempt to quit: 10/29/2015  Years since quitting: 1.5  . Smokeless tobacco: Never Used  Substance Use Topics  . Alcohol use: No    Alcohol/week: 0.0 oz  . Drug use: No     Allergies   Sulfa antibiotics   Review of Systems Review of Systems  All other systems reviewed and are negative.    Physical Exam Updated Vital Signs BP (!) 164/75   Pulse 83   Temp 97.6 F (36.4 C) (Oral)   Resp 18   Ht 5\' 7"  (1.702 m)   Wt 64.4 kg (142 lb)   SpO2 95%   BMI 22.24 kg/m   Physical Exam  Constitutional: She is oriented to person, place, and time.  Coughing, no acute distress.  HENT:  Head: Normocephalic and atraumatic.  Eyes: Conjunctivae are normal.  Neck: Neck supple.  Cardiovascular: Normal rate and regular rhythm.  Pulmonary/Chest: Effort normal.  Bilateral expiratory wheeze  Abdominal: Soft. Bowel sounds are normal.  Musculoskeletal: Normal range of motion.  Neurological: She is alert and oriented to person, place, and time.  Skin: Skin is warm and dry.  Psychiatric: She has a normal mood and affect. Her behavior is normal.  Nursing note and vitals reviewed.    ED Treatments / Results  Labs (all labs ordered are listed, but only abnormal results are displayed) Labs Reviewed - No data to display  EKG  EKG Interpretation None       Radiology Dg Chest 2 View  Result Date: 05/19/2017 CLINICAL DATA:  Cough and shortness of  breath. EXAM: CHEST  2 VIEW COMPARISON:  Chest x-ray dated October 28, 2016. FINDINGS: The heart size and mediastinal contours are within normal limits. Normal pulmonary vascularity. Atherosclerotic calcification of the aortic arch. The lungs remain hyperinflated with emphysematous changes. No focal consolidation, pleural effusion, or pneumothorax. No acute osseous abnormality. IMPRESSION: COPD.  No active cardiopulmonary disease. Electronically Signed   By: Titus Dubin M.D.   On: 05/19/2017 16:19    Procedures Procedures (including critical care time)  Medications Ordered in ED Medications  ipratropium-albuterol (DUONEB) 0.5-2.5 (3) MG/3ML nebulizer solution 3 mL (3 mLs Nebulization Given 05/19/17 1947)  dexamethasone (DECADRON) injection 8 mg (8 mg Intramuscular Given 05/19/17 1935)  chlorpheniramine-HYDROcodone (TUSSIONEX) 10-8 MG/5ML suspension 5 mL (5 mLs Oral Given 05/19/17 1935)  acetaminophen (TYLENOL) tablet 1,000 mg (1,000 mg Oral Given 05/19/17 2028)  ibuprofen (ADVIL,MOTRIN) tablet 800 mg (800 mg Oral Given 05/19/17 2029)     Initial Impression / Assessment and Plan / ED Course  I have reviewed the triage vital signs and the nursing notes.  Pertinent labs & imaging results that were available during my care of the patient were reviewed by me and considered in my medical decision making (see chart for details).     Patient with a known history of COPD presents with a history of persistent coughing.  She is not in respiratory extremis.  Chest x-ray negative for pneumonia.  She was given a nebulizer treatment and intramuscular Decadron in the ED.  Discharge medications Zithromax, prednisone, Tussionex.  Final Clinical Impressions(s) / ED Diagnoses   Final diagnoses:  COPD exacerbation [facility])    ED Discharge Orders        Ordered    azithromycin (ZITHROMAX) 250 MG tablet  Daily     05/19/17 2045    chlorpheniramine-HYDROcodone (TUSSIONEX PENNKINETIC ER) 10-8 MG/5ML SUER  Every  12 hours PRN     05/19/17 2045    predniSONE (DELTASONE) 10 MG tablet  Daily with breakfast  05/19/17 2045       Nat Christen, MD 05/19/17 2051

## 2017-05-26 ENCOUNTER — Ambulatory Visit (INDEPENDENT_AMBULATORY_CARE_PROVIDER_SITE_OTHER): Payer: Medicare Other | Admitting: Cardiology

## 2017-05-26 ENCOUNTER — Encounter: Payer: Self-pay | Admitting: Cardiology

## 2017-05-26 VITALS — BP 124/76 | HR 94 | Ht 67.0 in | Wt 139.0 lb

## 2017-05-26 DIAGNOSIS — R0789 Other chest pain: Secondary | ICD-10-CM

## 2017-05-26 DIAGNOSIS — I4892 Unspecified atrial flutter: Secondary | ICD-10-CM

## 2017-05-26 NOTE — Patient Instructions (Signed)
Your physician wants you to follow-up in: 6 months with Dr Bryna Colander will receive a reminder letter in the mail two months in advance. If you don't receive a letter, please call our office to schedule the follow-up appointment.    Your physician recommends that you continue on your current medications as directed. Please refer to the Current Medication list given to you today.   If you need a refill on your cardiac medications before your next appointment, please call your pharmacy.    No lab work or tests ordered      Thank you for choosing [facility] !

## 2017-05-26 NOTE — Progress Notes (Signed)
Clinical Summary Patricia Jackson is a 77 y.o.female seen today for follow up of the following medical problems.   1. Atypical chest pain/CAD - last stent 03/2016 - recent admission with chest pain - described as back pain radiating down both arms that woke her from sleep -started lower neck/upper back, radiatiated to both arms Sharp pain, worst with position. Pain lasted approx 1 hour. No SOB, no diaphoresis, no N/V. No recurrence of pain since discharge - no evidence of ACS by EKG or enzymes - echo 11/2013 LVEF 79-02%, grade I diastolic dysfunction - CT chest without aneurysm or dissection, no PE. +atherosclerotic changes in aorta.   - mild nonspecific pains at times. Left chest, pinching feeling. Lasts about 1 minute. Mild dizziness at times. Can occur at rest or with activity.  -comcpliant with meds  2. Aflutter - admit 11/2015 pneumonia, found to be in aflutter - denies any recent symptoms  - history of severe anemia during admission Jan 2018 while on ASA/plavix/eliquis after stent - at that time eliquis and aspririn stopped, continued on plavix. This is the first time I've seen her since that admission, remains only on plavix.   3. COPD - followed by pulmonary   4. Anemia - elquis stopped during prior admission with anemia Jan 2018, transfused at that time. She was on brilinta and eliquis at the time, changed to just plavix.   Past Medical History:  Diagnosis Date  . Atrial flutter ([place])    On Eliquis in 8/17 but discontinued after stent placement  . Breast cancer ([place])    remote  . CAD in native artery 03/16/2016  . COPD (chronic obstructive pulmonary disease) ([place])   . Hypertension   . S/P angioplasty with stent 03/15/16 DES Resolute, pLCX 03/16/2016     Allergies  Allergen Reactions  . Sulfa Antibiotics Hives     Current Outpatient Medications  Medication Sig Dispense Refill  . acetaminophen (TYLENOL) 500 MG tablet Take 500 mg by mouth every 6 (six)  hours as needed for mild pain, moderate pain, fever or headache.     . albuterol (PROVENTIL HFA;VENTOLIN HFA) 108 (90 Base) MCG/ACT inhaler Inhale 2 puffs into the lungs every 6 (six) hours as needed for wheezing or shortness of breath.    Marland Kitchen albuterol (PROVENTIL) (2.5 MG/3ML) 0.083% nebulizer solution Take 3 mLs (2.5 mg total) by nebulization every 6 (six) hours as needed for wheezing or shortness of breath. 75 mL 12  . amLODipine (NORVASC) 5 MG tablet Take 1 tablet (5 mg total) by mouth daily. 90 tablet 3  . azithromycin (ZITHROMAX) 250 MG tablet Take 1 tablet (250 mg total) by mouth daily. Take first 2 tablets together, then 1 every day until finished. 6 tablet 0  . buPROPion (WELLBUTRIN XL) 300 MG 24 hr tablet Take 300 mg by mouth daily.     . chlorpheniramine-HYDROcodone (TUSSIONEX PENNKINETIC ER) 10-8 MG/5ML SUER Take 5 mLs by mouth every 12 (twelve) hours as needed for cough. 140 mL 0  . clopidogrel (PLAVIX) 75 MG tablet Take 1 tablet (75 mg total) by mouth daily. 90 tablet 3  . diphenhydrAMINE (BENADRYL) 25 MG tablet Take 50 mg by mouth at bedtime as needed for sleep.    . ferrous sulfate (FERROUSUL) 325 (65 FE) MG tablet Take 1 tablet (325 mg total) by mouth daily with breakfast. 30 tablet 0  . levocetirizine (XYZAL) 5 MG tablet Take 5 mg by mouth at bedtime.     . nitroGLYCERIN (NITROSTAT)  0.4 MG SL tablet Place 0.4 mg under the tongue every 5 (five) minutes as needed for chest pain.    . pantoprazole (PROTONIX) 20 MG tablet TAKE ONE (1) TABLET EACH DAY 30 tablet 5  . predniSONE (DELTASONE) 10 MG tablet Take 1 tablet (10 mg total) by mouth daily with breakfast. 3 tablets for 4 days, 2 tablets for 4 days, 1 tablet for 4 days 24 tablet 0  . rosuvastatin (CRESTOR) 20 MG tablet Take 20 mg by mouth daily.     . SYMBICORT 160-4.5 MCG/ACT inhaler Inhale 1 puff into the lungs daily. 1 Inhaler 5  . traMADol (ULTRAM) 50 MG tablet Take 1 tablet (50 mg total) by mouth every 8 (eight) hours as needed. 30  tablet 0  . umeclidinium bromide (INCRUSE ELLIPTA) 62.5 MCG/INH AEPB Inhale 1 puff into the lungs daily.     No current facility-administered medications for this visit.      Past Surgical History:  Procedure Laterality Date  . APPENDECTOMY    . BREAST SURGERY    . CARDIAC CATHETERIZATION N/A 03/15/2016   Procedure: Left Heart Cath and Coronary Angiography;  Surgeon: Belva Crome, MD;  Location: [place] CV LAB;  Service: Cardiovascular;  Laterality: N/A;  . CARDIAC CATHETERIZATION N/A 03/15/2016   Procedure: Coronary Stent Intervention;  Surgeon: Belva Crome, MD;  Location: [place] CV LAB;  Service: Cardiovascular;  Laterality: N/A;  . COLONOSCOPY     remote  . COLONOSCOPY N/A 05/26/2016   Procedure: COLONOSCOPY;  Surgeon: Daneil Dolin, MD;  [facility];  Service: Endoscopy;  Laterality: N/A;  845  . ESOPHAGOGASTRODUODENOSCOPY N/A 05/26/2016   Procedure: ESOPHAGOGASTRODUODENOSCOPY (EGD);  Surgeon: Daneil Dolin, MD;  [facility];  Service: Endoscopy;  Laterality: N/A;  Venia Minks DILATION N/A 05/26/2016   Procedure: Venia Minks DILATION;  Surgeon: Daneil Dolin, MD;  [facility];  Service: Endoscopy;  Laterality: N/A;     Allergies  Allergen Reactions  . Sulfa Antibiotics Hives      Family History  Problem Relation Age of Onset  . Heart disease Mother   . COPD Father   . Cancer Sister        unknown primary  . Cancer Brother        unknown primary  . Colon cancer Neg Hx      Social History Ms. Grein reports that she quit smoking about 18 months ago. Her smoking use included cigarettes. She started smoking about 51 years ago. She has a 56.00 pack-year smoking history. she has never used smokeless tobacco. Ms. Carnegie reports that she does not drink alcohol.   Review of Systems CONSTITUTIONAL: No weight loss, fever, chills, weakness or fatigue.  HEENT: Eyes: No visual loss, blurred vision, double vision or yellow sclerae.No  hearing loss, sneezing, congestion, runny nose or sore throat.  SKIN: No rash or itching.  CARDIOVASCULAR: per hpi RESPIRATORY: No shortness of breath, cough or sputum.  GASTROINTESTINAL: No anorexia, nausea, vomiting or diarrhea. No abdominal pain or blood.  GENITOURINARY: No burning on urination, no polyuria NEUROLOGICAL: No headache, dizziness, syncope, paralysis, ataxia, numbness or tingling in the extremities. No change in bowel or bladder control.  MUSCULOSKELETAL: No muscle, back pain, joint pain or stiffness.  LYMPHATICS: No enlarged nodes. No history of splenectomy.  PSYCHIATRIC: No history of depression or anxiety.  ENDOCRINOLOGIC: No reports of sweating, cold or heat intolerance. No polyuria or polydipsia.  Marland Kitchen   Physical Examination Vitals:  05/26/17 1517  BP: 124/76  Pulse: 94  SpO2: 95%   Vitals:   05/26/17 1517  Weight: 139 lb (63 kg)  Height: 5' 7"  (1.702 m)    Gen: resting comfortably, no acute distress HEENT: no scleral icterus, pupils equal round and reactive, no palptable cervical adenopathy,  CV: RRR, no m/r/g, no jvd Resp: Clear to auscultation bilaterally GI: abdomen is soft, non-tender, non-distended, normal bowel sounds, no hepatosplenomegaly MSK: extremities are warm, no edema.  Skin: warm, no rash Neuro:  no focal deficits Psych: appropriate affect   Diagnostic Studies  11/2013 Echo Study Conclusions  - Left ventricle: The cavity size was normal. Wall thickness was at the upper limits of normal. Systolic function was vigorous. The estimated ejection fraction was in the range of 65% to 70%. Wall motion was normal; there were no regional wall motion abnormalities. Doppler parameters are consistent with abnormal left ventricular relaxation (grade 1 diastolic dysfunction). Doppler parameters are consistent with elevated ventricular end-diastolic filling pressure. - Mitral valve: Calcified annulus. There was mild regurgitation. - Right atrium:  Central venous pressure (est): 3 mm Hg. - Atrial septum: No defect or patent foramen ovale was identified. - Tricuspid valve: There was trivial regurgitation. - Pulmonary arteries: PA peak pressure: 28 mm Hg (S). - Pericardium, extracardiac: There was no pericardial effusion.  Impressions:  - Upper normal LV wall thickness with LVEF 76-73%, grade 1 diastolic dysfunction with increased filling pressures. Mild mitral regurgitation. Trivial tricuspid regurgitation with normal PASP 28 mmHg.  10/2015 Echo Study Conclusions  - Left ventricle: Systolic function was normal. The estimated ejection fraction was in the range of 55% to 60%. Wall motion was normal; there were no regional wall motion abnormalities. 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 - Mitral valve: Calcified annulus. There was mild to moderate regurgitation directed centrally. Valve area by pressure half-time: 2.34 cm^2. - Pulmonary arteries: Systolic pressure was mildly increased. PA peak pressure: 41 mm Hg (S).   Assessment and Plan   1. Atypical chest pain - atypical symptoms, we will continue to monitor at this time.   2. Aflutter - off eliquis since Jan 2018 when she had severe anemia in the setting of triple therapy after a stent in 03/2016 - this is the first time Ive seen her since that admission. Will d/c plavix, start eliquis 31m bid    F/u 6 months     JArnoldo Lenis M.D

## 2017-05-30 ENCOUNTER — Ambulatory Visit: Payer: Medicare Other | Admitting: Family

## 2017-05-30 ENCOUNTER — Encounter (HOSPITAL_COMMUNITY): Payer: Medicare Other

## 2017-05-31 ENCOUNTER — Encounter: Payer: Self-pay | Admitting: Cardiology

## 2017-06-02 ENCOUNTER — Encounter: Payer: Self-pay | Admitting: Cardiology

## 2017-06-03 ENCOUNTER — Telehealth: Payer: Self-pay

## 2017-06-03 DIAGNOSIS — Z79899 Other long term (current) drug therapy: Secondary | ICD-10-CM

## 2017-06-03 MED ORDER — APIXABAN 5 MG PO TABS: 5.0000 mg | ORAL_TABLET | Freq: Two times a day (BID) | ORAL | 3 refills | Status: DC

## 2017-06-03 NOTE — Telephone Encounter (Signed)
Called pt., no answer. Left message for pt to return call.  

## 2017-06-03 NOTE — Telephone Encounter (Signed)
-----   Message from [place] sent at 06/02/2017  4:09 PM EST -----   ----- Message ----- From: Arnoldo Lenis, MD Sent: 06/02/2017  12:53 PM To: Merceda Elks Ashworth, CMA  Please let this pt know that I reviewed her records in further detail since our last visit. Id like her to stop plavix and start eliquis 5mg  bid. She needs a cbc in 1 month  J BrancH MD

## 2017-06-03 NOTE — Telephone Encounter (Signed)
-----   Message from [place] sent at 06/02/2017  4:09 PM EST -----   ----- Message ----- From: Arnoldo Lenis, MD Sent: 06/02/2017  12:53 PM To: Merceda Elks Ashworth, CMA  Please let this pt know that I reviewed her records in further detail since our last visit. Id like her to stop plavix and start eliquis 5mg  bid. She needs a cbc in 1 month  J BrancH MD

## 2017-06-03 NOTE — Telephone Encounter (Signed)
Spoke with pt's son. Made him aware of medication change.

## 2017-06-13 ENCOUNTER — Ambulatory Visit (HOSPITAL_COMMUNITY)
Admission: RE | Admit: 2017-06-13 | Discharge: 2017-06-13 | Disposition: A | Discharge status: Home / Self Care | Payer: Medicare Other | Source: Ambulatory Visit | Attending: Vascular Surgery | Admitting: Vascular Surgery

## 2017-06-13 ENCOUNTER — Other Ambulatory Visit: Payer: Self-pay | Admitting: Pulmonary Disease

## 2017-06-13 ENCOUNTER — Encounter: Payer: Self-pay | Admitting: Family

## 2017-06-13 ENCOUNTER — Ambulatory Visit (INDEPENDENT_AMBULATORY_CARE_PROVIDER_SITE_OTHER): Payer: Medicare Other | Admitting: Family

## 2017-06-13 VITALS — BP 134/79 | HR 87 | Temp 97.0°F | Resp 20 | Ht 67.0 in | Wt 143.7 lb

## 2017-06-13 DIAGNOSIS — R0989 Other specified symptoms and signs involving the circulatory and respiratory systems: Secondary | ICD-10-CM | POA: Diagnosis not present

## 2017-06-13 DIAGNOSIS — I6522 Occlusion and stenosis of left carotid artery: Secondary | ICD-10-CM | POA: Insufficient documentation

## 2017-06-13 NOTE — Progress Notes (Signed)
Chief Complaint: Follow up Extracranial Carotid Artery Stenosis   History of Present Illness  Patricia Jackson is a 77 y.o. female whom Dr. Donnetta Hutching has been monitoring for extracranial carotid artery stenosis. She has had no focal neurologic deficits. She does have continued COPD exacerbations. Fortunately she did quit smoking in July 2017. Does report several unusual upper extremity symptoms. She reports worsening tremor in both upper extremities. Also reports strange occurrence of pain extending from her shoulders down into her extremities. This can occur in one side or the other or bilaterally. No aggravating issues.   She denies any known history of stroke or TIA. Specifically she denies a history of amaurosis fugax or monocular blindness, unilateral facial drooping, hemiplegia, or receptive or expressive aphasia.    Dr. Donnetta Hutching last evaluated pt on 02-10-16. At that time carotid duplex showed no change with no significant right carotid stenosis and 40-59% left carotid stenosis. Dr. Donnetta Hutching discussed symptoms of carotid disease with the patient. She is right-handed. She will present to the emergency room should she have any new deficits. Unclear as to the etiology of her tremor and upper arm discomfort but Dr. Donnetta Hutching did not feel this is related to arterial pathology. She was to discuss this with Dr. Nevada Crane. She was to follow up in one year for continued duplex follow-up.  She needed blood transfusion recently, pt does not know why she is anemic, was advised to take po iron.   Diabetic: no Tobacco use: former smoker, quit in 2017   Pt meds include: Statin : yes ASA: no Other anticoagulants/antiplatelets: Eliquis, has a hx of atrial flutter   Past Medical History:  Diagnosis Date  . Atrial flutter ([place])    On Eliquis in 8/17 but discontinued after stent placement  . Breast cancer ([place])    remote  . CAD in native artery 03/16/2016  . COPD (chronic obstructive pulmonary disease) ([place])   .  Hypertension   . S/P angioplasty with stent 03/15/16 DES Resolute, pLCX 03/16/2016    Social History Social History   Tobacco Use  . Smoking status: Former Smoker    Packs/day: 1.00    Years: 56.00    Pack years: 56.00    Types: Cigarettes    Start date: 12/15/1965    Last attempt to quit: 10/29/2015    Years since quitting: 1.6  . Smokeless tobacco: Never Used  Substance Use Topics  . Alcohol use: No    Alcohol/week: 0.0 oz  . Drug use: No    Family History Family History  Problem Relation Age of Onset  . Heart disease Mother   . COPD Father   . Cancer Sister        unknown primary  . Cancer Brother        unknown primary  . Colon cancer Neg Hx     Surgical History Past Surgical History:  Procedure Laterality Date  . APPENDECTOMY    . BREAST SURGERY    . CARDIAC CATHETERIZATION N/A 03/15/2016   Procedure: Left Heart Cath and Coronary Angiography;  Surgeon: Belva Crome, MD;  Location: [place] [facility];  Service: Cardiovascular;  Laterality: N/A;  . CARDIAC CATHETERIZATION N/A 03/15/2016   Procedure: Coronary Stent Intervention;  Surgeon: Belva Crome, MD;  Location: [place] [facility];  Service: Cardiovascular;  Laterality: N/A;  . COLONOSCOPY     remote  . COLONOSCOPY N/A 05/26/2016   Procedure: COLONOSCOPY;  Surgeon: Daneil Dolin, MD;  [facility];  Service: Endoscopy;  Laterality: N/A;  845  . ESOPHAGOGASTRODUODENOSCOPY N/A 05/26/2016   Procedure: ESOPHAGOGASTRODUODENOSCOPY (EGD);  Surgeon: Daneil Dolin, MD;  [facility];  Service: Endoscopy;  Laterality: N/A;  Venia Minks DILATION N/A 05/26/2016   Procedure: Venia Minks DILATION;  Surgeon: Daneil Dolin, MD;  [facility];  Service: Endoscopy;  Laterality: N/A;    Allergies  Allergen Reactions  . Sulfa Antibiotics Hives    Current Outpatient Medications  Medication Sig Dispense Refill  . acetaminophen (TYLENOL) 500 MG tablet Take 500 mg by mouth every 6 (six) hours as  needed for mild pain, moderate pain, fever or headache.     . albuterol (PROVENTIL HFA;VENTOLIN HFA) 108 (90 Base) MCG/ACT inhaler Inhale 2 puffs into the lungs every 6 (six) hours as needed for wheezing or shortness of breath.    Marland Kitchen amLODipine (NORVASC) 5 MG tablet     . apixaban (ELIQUIS) 5 MG TABS tablet Take 1 tablet (5 mg total) by mouth 2 (two) times daily. 60 tablet 3  . budesonide-formoterol (SYMBICORT) 160-4.5 MCG/ACT inhaler Inhale into the lungs.    Marland Kitchen buPROPion (WELLBUTRIN XL) 300 MG 24 hr tablet     . diphenhydrAMINE (BENADRYL) 25 MG tablet Take 50 mg by mouth at bedtime as needed for sleep.    . ferrous sulfate 325 (65 FE) MG tablet Take by mouth.    . fluconazole (DIFLUCAN) 150 MG tablet     . levocetirizine (XYZAL) 5 MG tablet Take 5 mg by mouth at bedtime.     Marland Kitchen levofloxacin (LEVAQUIN) 750 MG tablet     . nitroGLYCERIN (NITROSTAT) 0.4 MG SL tablet     . pantoprazole (PROTONIX) 20 MG tablet     . predniSONE (DELTASONE) 10 MG tablet Take 1 tablet (10 mg total) by mouth daily with breakfast. 3 tablets for 4 days, 2 tablets for 4 days, 1 tablet for 4 days 24 tablet 0  . rosuvastatin (CRESTOR) 20 MG tablet     . umeclidinium bromide (INCRUSE ELLIPTA) 62.5 MCG/INH AEPB     . amLODipine (NORVASC) 5 MG tablet Take 1 tablet (5 mg total) by mouth daily. 90 tablet 3  . traMADol (ULTRAM) 50 MG tablet Take 1 tablet (50 mg total) by mouth every 8 (eight) hours as needed. (Patient not taking: Reported on 06/13/2017) 30 tablet 0   No current facility-administered medications for this visit.     Review of Systems : See HPI for pertinent positives and negatives.  Physical Examination  Vitals:   06/13/17 1514  BP: 134/79  Pulse: 87  Resp: 20  Temp: (!) 97 F (36.1 C)  TempSrc: Oral  SpO2: 96%  Weight: 143 lb 11.2 oz (65.2 kg)  Height: 5\' 7"  (1.702 m)   Body mass index is 22.51 kg/m.  General: WDWN female in NAD GAIT: normal Eyes: PERRLA HENT: No gross abnormalities.   Pulmonary:  Respirations are non-labored, good air movement, CTAB, no rales, rhonchi, or wheezing. Cardiac: regular rhythm, no detected murmur.  VASCULAR EXAM Carotid Bruits Right Left   Negative Negative     Abdominal aortic pulse is not palpable. Radial pulses are 2+ palpable and equal.  LE Pulses Right Left       POPLITEAL  not palpable   not palpable       POSTERIOR TIBIAL  not palpable   2+ palpable        DORSALIS PEDIS      ANTERIOR TIBIAL not palpable  1+ palpable     Gastrointestinal: soft, nontender, BS WNL, no r/g, no palpable masses. Musculoskeletal: No muscle atrophy/wasting. M/S 5/5 throughout, extremities without ischemic changes. Flat mobile mass on right patella.  Skin: No rashes, no ulcers, no cellulitis.   Neurologic:  A&O X 3; appropriate affect, sensation is normal; speech is normal, CN 2-12 intact, pain and light touch intact in extremities, motor exam as listed above. Psychiatric: Normal thought content, mood is anxious.       Assessment: Patricia Jackson is a 77 y.o. female who has no history of stroke or TIA.   Right pedal pulses are not palpable, no signs of ischemia in her feet or legs. She has a heavy feeling in both legs after walking about 1 1/2 blocks; see Plan.  She has a flat mobile mass on her right patella, getting slightly bigger per history, occasionally has a burning sensation; I advised her to discuss this with her PCP.   DATA Carotid Duplex (06/13/17): Right ICA: 1-39% stenosis Left ICA: 40-59% stenosis Bilateral vertebral artery flow is antegrade.  Bilateral subclavian artery waveforms are normal.  No change compared to the exam on 02-10-16.    Plan: Follow-up in 1 year with Carotid Duplex scan and ABI's.    I discussed in depth with the patient the nature of atherosclerosis, and emphasized the importance  of maximal medical management including strict control of blood pressure, blood glucose, and lipid levels, obtaining regular exercise, and continued cessation of smoking.  The patient is aware that without maximal medical management the underlying atherosclerotic disease process will progress, limiting the benefit of any interventions. The patient was given information about stroke prevention and what symptoms should prompt the patient to seek immediate medical care. Thank you for allowing [place] to participate in this patient's care.  Clemon Chambers, RN, MSN, FNP-C [facility] Office: [facility] Physician: Trula Slade  06/13/17 3:19 PM

## 2017-06-13 NOTE — Patient Instructions (Signed)

## 2017-06-15 ENCOUNTER — Ambulatory Visit: Payer: Medicare Other | Admitting: Pulmonary Disease

## 2017-07-15 ENCOUNTER — Ambulatory Visit: Payer: Medicare Other | Admitting: Pulmonary Disease

## 2017-07-15 DIAGNOSIS — I482 Chronic atrial fibrillation: Secondary | ICD-10-CM | POA: Diagnosis not present

## 2017-07-15 DIAGNOSIS — R7301 Impaired fasting glucose: Secondary | ICD-10-CM | POA: Diagnosis not present

## 2017-07-15 DIAGNOSIS — R5381 Other malaise: Secondary | ICD-10-CM | POA: Diagnosis not present

## 2017-07-15 DIAGNOSIS — K029 Dental caries, unspecified: Secondary | ICD-10-CM | POA: Diagnosis not present

## 2017-07-15 DIAGNOSIS — E785 Hyperlipidemia, unspecified: Secondary | ICD-10-CM | POA: Diagnosis not present

## 2017-07-15 DIAGNOSIS — I10 Essential (primary) hypertension: Secondary | ICD-10-CM | POA: Diagnosis not present

## 2017-07-15 DIAGNOSIS — R944 Abnormal results of kidney function studies: Secondary | ICD-10-CM | POA: Diagnosis not present

## 2017-07-15 DIAGNOSIS — E782 Mixed hyperlipidemia: Secondary | ICD-10-CM | POA: Diagnosis not present

## 2017-07-20 DIAGNOSIS — I482 Chronic atrial fibrillation: Secondary | ICD-10-CM | POA: Diagnosis not present

## 2017-07-20 DIAGNOSIS — E782 Mixed hyperlipidemia: Secondary | ICD-10-CM | POA: Diagnosis not present

## 2017-07-20 DIAGNOSIS — I10 Essential (primary) hypertension: Secondary | ICD-10-CM | POA: Diagnosis not present

## 2017-07-20 DIAGNOSIS — K047 Periapical abscess without sinus: Secondary | ICD-10-CM | POA: Diagnosis not present

## 2017-07-29 ENCOUNTER — Encounter: Payer: Self-pay | Admitting: Pulmonary Disease

## 2017-07-29 ENCOUNTER — Ambulatory Visit (INDEPENDENT_AMBULATORY_CARE_PROVIDER_SITE_OTHER): Payer: Medicare Other | Admitting: Pulmonary Disease

## 2017-07-29 VITALS — BP 132/74 | HR 74 | Ht 66.0 in | Wt 142.4 lb

## 2017-07-29 DIAGNOSIS — J449 Chronic obstructive pulmonary disease, unspecified: Secondary | ICD-10-CM

## 2017-07-29 MED ORDER — FLUTICASONE-UMECLIDIN-VILANT 100-62.5-25 MCG/INH IN AEPB: 1.0000 | INHALATION_SPRAY | Freq: Every day | RESPIRATORY_TRACT | 0 refills | Status: AC

## 2017-07-29 MED ORDER — FLUTICASONE-UMECLIDIN-VILANT 100-62.5-25 MCG/INH IN AEPB: 1.0000 | INHALATION_SPRAY | Freq: Every day | RESPIRATORY_TRACT | 6 refills | Status: DC

## 2017-07-29 NOTE — Progress Notes (Signed)
Patricia Jackson    [id number]    [birth year]  Primary Care Physician:Hall, Edwinna Areola, MD  Referring Physician: Celene Squibb, MD [address] [place], [place] [address]  Chief complaint:  Follow-up for  COPD GOLD GOLD D  HPI: Patricia Jackson is a 77 year old with past medical history of atrial fibrillation, COPD GOLD (CAT score 24, Multiple exacerbations). She was hospitalized in August of 2017 with atypical chest pain, COPD exacerbation. She was treated with prednisone taper. She was also noted to be in atrial flutter. She was initially treated with a Cardizem drip and then transitioned to amiodarone. The plan is to use this for short-term of about 8 weeks. She is followed by cardiology. Had another hospitalization in December 2017 with NSTEMI status post catheterization and stent placement. Also noted to have low hemoglobin of 6.5. Transfused for an deficiency anemia.  Admitted in June 2018 to [facility] for acute dyspnea. She had an evaluation including chest x-ray which showed COPD. VQ scan shows low probability for pulmonary embolism and lower extremity ultrasound was negative for DVT. She improved with the treatment and was discharged. She did not require antibiotics or prednisone. Her inhalers have been changed from Symbicort, Spiriva to breo and incruse.   Interim History: After her last visit her inhalers have been changed back to Symbicort and Spiriva Seen by her primary care within the past month with complains of worsening dyspnea on Symbicort and Spiriva and was changed to anoro.  Outpatient Encounter Medications as of 07/29/2017  Medication Sig  . acetaminophen (TYLENOL) 500 MG tablet Take 500 mg by mouth every 6 (six) hours as needed for mild pain, moderate pain, fever or headache.   . albuterol (PROVENTIL HFA;VENTOLIN HFA) 108 (90 Base) MCG/ACT inhaler Inhale 2 puffs into the lungs every 6 (six) hours as needed for wheezing or shortness of breath.  Marland Kitchen  amLODipine (NORVASC) 5 MG tablet   . apixaban (ELIQUIS) 5 MG TABS tablet Take 1 tablet (5 mg total) by mouth 2 (two) times daily.  Marland Kitchen buPROPion (WELLBUTRIN XL) 300 MG 24 hr tablet   . diphenhydrAMINE (BENADRYL) 25 MG tablet Take 50 mg by mouth at bedtime as needed for sleep.  . ferrous sulfate 325 (65 FE) MG tablet Take by mouth.  . levocetirizine (XYZAL) 5 MG tablet Take 5 mg by mouth at bedtime.   . nitroGLYCERIN (NITROSTAT) 0.4 MG SL tablet   . pantoprazole (PROTONIX) 20 MG tablet   . rosuvastatin (CRESTOR) 20 MG tablet   . traMADol (ULTRAM) 50 MG tablet Take 1 tablet (50 mg total) by mouth every 8 (eight) hours as needed.  . umeclidinium bromide (INCRUSE ELLIPTA) 62.5 MCG/INH AEPB   . [DISCONTINUED] budesonide-formoterol (SYMBICORT) 160-4.5 MCG/ACT inhaler Inhale into the lungs.  . [DISCONTINUED] fluconazole (DIFLUCAN) 150 MG tablet   . [DISCONTINUED] levofloxacin (LEVAQUIN) 750 MG tablet   . [DISCONTINUED] predniSONE (DELTASONE) 10 MG tablet Take 1 tablet (10 mg total) by mouth daily with breakfast. 3 tablets for 4 days, 2 tablets for 4 days, 1 tablet for 4 days  . [DISCONTINUED] amLODipine (NORVASC) 5 MG tablet Take 1 tablet (5 mg total) by mouth daily.   No facility-administered encounter medications on file as of 07/29/2017.     Allergies as of 07/29/2017 - Review Complete 07/29/2017  Allergen Reaction Noted  . Sulfa antibiotics Hives 08/02/2013    Past Medical History:  Diagnosis Date  . Atrial flutter ([place])  On Eliquis in 8/17 but discontinued after stent placement  . Breast cancer ([place])    remote  . CAD in native artery 03/16/2016  . COPD (chronic obstructive pulmonary disease) ([place])   . Hypertension   . S/P angioplasty with stent 03/15/16 DES Resolute, [place] 03/16/2016    Past Surgical History:  Procedure Laterality Date  . APPENDECTOMY    . BREAST SURGERY    . CARDIAC CATHETERIZATION N/A 03/15/2016   Procedure: Left Heart Cath and Coronary Angiography;  Surgeon:  Belva Crome, MD;  Location: [place] [facility];  Service: Cardiovascular;  Laterality: N/A;  . CARDIAC CATHETERIZATION N/A 03/15/2016   Procedure: Coronary Stent Intervention;  Surgeon: Belva Crome, MD;  Location: [place] [facility];  Service: Cardiovascular;  Laterality: N/A;  . COLONOSCOPY     remote  . COLONOSCOPY N/A 05/26/2016   Procedure: COLONOSCOPY;  Surgeon: Daneil Dolin, MD;  [facility];  Service: Endoscopy;  Laterality: N/A;  845  . ESOPHAGOGASTRODUODENOSCOPY N/A 05/26/2016   Procedure: ESOPHAGOGASTRODUODENOSCOPY (EGD);  Surgeon: Daneil Dolin, MD;  [facility];  Service: Endoscopy;  Laterality: N/A;  Venia Minks DILATION N/A 05/26/2016   Procedure: Venia Minks DILATION;  Surgeon: Daneil Dolin, MD;  [facility];  Service: Endoscopy;  Laterality: N/A;    Family History  Problem Relation Age of Onset  . Heart disease Mother   . COPD Father   . Cancer Sister        unknown primary  . Cancer Brother        unknown primary  . Colon cancer Neg Hx     Social History   Socioeconomic History  . Marital status: Single    Spouse name: Not on file  . Number of children: Not on file  . Years of education: Not on file  . Highest education level: Not on file  Occupational History  . Occupation: Retired  Scientific laboratory technician  . Financial resource strain: Not on file  . Food insecurity:    Worry: Not on file    Inability: Not on file  . Transportation needs:    Medical: Not on file    Non-medical: Not on file  Tobacco Use  . Smoking status: Former Smoker    Packs/day: 1.00    Years: 56.00    Pack years: 56.00    Types: Cigarettes    Start date: 12/15/1965    Last attempt to quit: 10/29/2015    Years since quitting: 1.7  . Smokeless tobacco: Never Used  Substance and Sexual Activity  . Alcohol use: No    Alcohol/week: 0.0 oz  . Drug use: No  . Sexual activity: Not Currently  Lifestyle  . Physical activity:    Days per week: Not on file     Minutes per session: Not on file  . Stress: Not on file  Relationships  . Social connections:    Talks on phone: Not on file    Gets together: Not on file    Attends religious service: Not on file    Active member of club or organization: Not on file    Attends meetings of clubs or organizations: Not on file    Relationship status: Not on file  . Intimate partner violence:    Fear of current or ex partner: Not on file    Emotionally abused: Not on file    Physically abused: Not on file    Forced sexual activity: Not on  file  Other Topics Concern  . Not on file  Social History Narrative  . Not on file   Review of systems: Review of Systems  Constitutional: Negative for fever and chills.  HENT: Negative.   Eyes: Negative for blurred vision.  Respiratory: as per HPI  Cardiovascular: Negative for chest pain and palpitations.  Gastrointestinal: Negative for vomiting, diarrhea, blood per rectum. Genitourinary: Negative for dysuria, urgency, frequency and hematuria.  Musculoskeletal: Negative for myalgias, back pain and joint pain.  Skin: Negative for itching and rash.  Neurological: Negative for dizziness, tremors, focal weakness, seizures and loss of consciousness.  Endo/Heme/Allergies: Negative for environmental allergies.  Psychiatric/Behavioral: Negative for depression, suicidal ideas and hallucinations.  All other systems reviewed and are negative.  Physical Exam: Blood pressure 116/68, pulse 67, height 5' 7"  (1.702 m), weight 134 lb 3.2 oz (60.9 kg), SpO2 91 %. Gen:      No acute distress HEENT:  EOMI, sclera anicteric Neck:     No masses; no thyromegaly Lungs:    Clear to auscultation bilaterally; normal respiratory effort CV:         Regular rate and rhythm; no murmurs Abd:      + bowel sounds; soft, non-tender; no palpable masses, no distension Ext:    No edema; adequate peripheral perfusion Skin:      Warm and dry; no rash Neuro: alert and oriented x 3 Psych:  normal mood and affect  Data Reviewed: PFT  11/21/13: FVC 2.54 L (75%)  FEV1 1.68 L (66%)  FEV1/FVC 0.66  FEF 25-75 0.83 L (40%) no bronchodilator response  TLC 5.65 L (102%)  RV 138%  DLCO uncorrected 40% Moderate obstructive defect with severe DLCO impairment.  05/11/16 FVC 2.42 (74%) FEV1 1.6 (64%) F/F 66 TLC 84% DLCO 33%  Moderate obstructive defect with severe DLCO impairment and positive bronchodilator response  CT chest 11/26/13. Diffuse emphysematous changes Screening CT of chest 06/23/16-. Emphysematous changes, three-vessel coronary artery disease Chest x-ray 09/08/16-chronic hyperinflation, emphysematous changes VQ scan 09/08/16-low probability Lower extremity ultrasound 09/09/16-no DVT High-resolution CT 09/24/2016- No interstitial lung disease, moderate emphysema three-vessel coronary artery disease, I have reviewed all images personally.  TTE 7/28:LV without regional wall motion abnormalities. LA &RA normal in size. RV normal in size and function. Pulmonary artery systolic pressure 41 mmHg. No aortic stenosis or regurgitation. No mitral stenosis but mild to moderate regurgitation directed centrally. Trivial tricuspid regurgitation. Poorly visualized pulmonic valve. No pericardial effusion.  CBC 09/19/2016-WBC 7.8, eos 3%, absolute eosinophil count 200  Assessment:  COPD GOLD D Continues to be symptomatic with dyspnea on exertion, chronic bronchitis Her inhalers have been switched around several times over the past year and she is currently on a trial of Anoro by her primary. She has not noticed a difference with anoro and may benefit from trelegy  Did not desat on exertion. Needs a referral to pulmonary rehab  She's had prior CT scans which reports pulmonary fibrosis although high-resolution CT last year did not show any interstitial lung disease  Past Smoker She continues to be tobacco free. Continue low-dose screening CTs of the chest.  Plan/Recommendations: -  Stop anoro, start trelegy - Pulmonary rehab.  Return in 6 months.  Marshell Garfinkel MD [facility] Pager [phone] 07/29/2017, 11:47 AM  CC: Patricia Squibb, MD

## 2017-07-29 NOTE — Patient Instructions (Addendum)
We will stop the anoro and start her on Trelegy inhaler. Continue albuterol as needed We will check your oxygen levels on exertion today We will refer you for pulmonary rehab Follow-up in 6 months.

## 2017-08-02 ENCOUNTER — Telehealth (HOSPITAL_COMMUNITY): Payer: Self-pay

## 2017-08-03 DIAGNOSIS — I482 Chronic atrial fibrillation: Secondary | ICD-10-CM | POA: Diagnosis not present

## 2017-08-03 DIAGNOSIS — E039 Hypothyroidism, unspecified: Secondary | ICD-10-CM | POA: Diagnosis not present

## 2017-08-03 DIAGNOSIS — J449 Chronic obstructive pulmonary disease, unspecified: Secondary | ICD-10-CM | POA: Diagnosis not present

## 2017-08-26 ENCOUNTER — Emergency Department (HOSPITAL_COMMUNITY): Payer: Medicare Other

## 2017-08-26 ENCOUNTER — Other Ambulatory Visit: Payer: Self-pay

## 2017-08-26 ENCOUNTER — Observation Stay (HOSPITAL_COMMUNITY)
Admission: EM | Admit: 2017-08-26 | Discharge: 2017-08-27 | Disposition: A | Discharge status: Home / Self Care | Payer: Medicare Other | Attending: Internal Medicine | Admitting: Internal Medicine

## 2017-08-26 ENCOUNTER — Encounter (HOSPITAL_COMMUNITY): Payer: Self-pay | Admitting: *Deleted

## 2017-08-26 DIAGNOSIS — Z9861 Coronary angioplasty status: Secondary | ICD-10-CM | POA: Diagnosis not present

## 2017-08-26 DIAGNOSIS — R079 Chest pain, unspecified: Secondary | ICD-10-CM

## 2017-08-26 DIAGNOSIS — E782 Mixed hyperlipidemia: Secondary | ICD-10-CM | POA: Diagnosis present

## 2017-08-26 DIAGNOSIS — I951 Orthostatic hypotension: Secondary | ICD-10-CM | POA: Diagnosis present

## 2017-08-26 DIAGNOSIS — Z7901 Long term (current) use of anticoagulants: Secondary | ICD-10-CM | POA: Insufficient documentation

## 2017-08-26 DIAGNOSIS — I480 Paroxysmal atrial fibrillation: Secondary | ICD-10-CM | POA: Diagnosis not present

## 2017-08-26 DIAGNOSIS — F329 Major depressive disorder, single episode, unspecified: Secondary | ICD-10-CM | POA: Diagnosis not present

## 2017-08-26 DIAGNOSIS — Z87891 Personal history of nicotine dependence: Secondary | ICD-10-CM | POA: Diagnosis not present

## 2017-08-26 DIAGNOSIS — I214 Non-ST elevation (NSTEMI) myocardial infarction: Secondary | ICD-10-CM | POA: Diagnosis present

## 2017-08-26 DIAGNOSIS — R55 Syncope and collapse: Secondary | ICD-10-CM | POA: Diagnosis not present

## 2017-08-26 DIAGNOSIS — J449 Chronic obstructive pulmonary disease, unspecified: Secondary | ICD-10-CM | POA: Diagnosis not present

## 2017-08-26 DIAGNOSIS — F32A Depression, unspecified: Secondary | ICD-10-CM | POA: Diagnosis present

## 2017-08-26 DIAGNOSIS — Z853 Personal history of malignant neoplasm of breast: Secondary | ICD-10-CM | POA: Insufficient documentation

## 2017-08-26 DIAGNOSIS — Z95820 Peripheral vascular angioplasty status with implants and grafts: Secondary | ICD-10-CM

## 2017-08-26 DIAGNOSIS — Z79899 Other long term (current) drug therapy: Secondary | ICD-10-CM | POA: Diagnosis not present

## 2017-08-26 DIAGNOSIS — N184 Chronic kidney disease, stage 4 (severe): Secondary | ICD-10-CM | POA: Diagnosis present

## 2017-08-26 DIAGNOSIS — F418 Other specified anxiety disorders: Secondary | ICD-10-CM | POA: Diagnosis present

## 2017-08-26 DIAGNOSIS — R0789 Other chest pain: Secondary | ICD-10-CM | POA: Diagnosis present

## 2017-08-26 DIAGNOSIS — I2510 Atherosclerotic heart disease of native coronary artery without angina pectoris: Secondary | ICD-10-CM | POA: Diagnosis not present

## 2017-08-26 DIAGNOSIS — I4892 Unspecified atrial flutter: Secondary | ICD-10-CM | POA: Diagnosis present

## 2017-08-26 DIAGNOSIS — Z7982 Long term (current) use of aspirin: Secondary | ICD-10-CM | POA: Insufficient documentation

## 2017-08-26 DIAGNOSIS — I129 Hypertensive chronic kidney disease with stage 1 through stage 4 chronic kidney disease, or unspecified chronic kidney disease: Secondary | ICD-10-CM | POA: Insufficient documentation

## 2017-08-26 DIAGNOSIS — E785 Hyperlipidemia, unspecified: Secondary | ICD-10-CM | POA: Diagnosis present

## 2017-08-26 DIAGNOSIS — R61 Generalized hyperhidrosis: Secondary | ICD-10-CM | POA: Diagnosis not present

## 2017-08-26 DIAGNOSIS — I252 Old myocardial infarction: Secondary | ICD-10-CM | POA: Diagnosis not present

## 2017-08-26 DIAGNOSIS — E039 Hypothyroidism, unspecified: Secondary | ICD-10-CM | POA: Insufficient documentation

## 2017-08-26 DIAGNOSIS — J8410 Pulmonary fibrosis, unspecified: Secondary | ICD-10-CM | POA: Diagnosis present

## 2017-08-26 DIAGNOSIS — I10 Essential (primary) hypertension: Secondary | ICD-10-CM | POA: Diagnosis present

## 2017-08-26 LAB — DIFFERENTIAL
Basophils Absolute: 0 10*3/uL (ref 0.0–0.1)
Basophils Relative: 1 %
Eosinophils Absolute: 0.2 10*3/uL (ref 0.0–0.7)
Eosinophils Relative: 3 %
Lymphocytes Relative: 22 %
Lymphs Abs: 1.6 10*3/uL (ref 0.7–4.0)
Monocytes Absolute: 0.5 10*3/uL (ref 0.1–1.0)
Monocytes Relative: 7 %
Neutro Abs: 5.1 10*3/uL (ref 1.7–7.7)
Neutrophils Relative %: 69 %

## 2017-08-26 LAB — CBC
HCT: 38.3 % (ref 36.0–46.0)
Hemoglobin: 12.2 g/dL (ref 12.0–15.0)
MCH: 29.9 pg (ref 26.0–34.0)
MCHC: 31.9 g/dL (ref 30.0–36.0)
MCV: 93.9 fL (ref 78.0–100.0)
Platelets: 143 10*3/uL — ABNORMAL LOW (ref 150–400)
RBC: 4.08 MIL/uL (ref 3.87–5.11)
RDW: 13.9 % (ref 11.5–15.5)
WBC: 7.5 10*3/uL (ref 4.0–10.5)

## 2017-08-26 LAB — URINALYSIS, MICROSCOPIC (REFLEX)

## 2017-08-26 LAB — TROPONIN I
Troponin I: <0.03 ng/mL (ref <0.03)
Troponin I: <0.03 ng/mL (ref <0.03)
Troponin I: <0.03 ng/mL (ref <0.03)

## 2017-08-26 LAB — URINALYSIS, ROUTINE W REFLEX MICROSCOPIC
Bilirubin Urine: NEGATIVE
Glucose, UA: NEGATIVE mg/dL
Hgb urine dipstick: NEGATIVE
Ketones, ur: NEGATIVE mg/dL
Nitrite: NEGATIVE
Protein, ur: NEGATIVE mg/dL
Specific Gravity, Urine: 1.02 (ref 1.005–1.030)
pH: 6 (ref 5.0–8.0)

## 2017-08-26 LAB — BASIC METABOLIC PANEL
Anion gap: 8 (ref 5–15)
BUN: 23 mg/dL — ABNORMAL HIGH (ref 6–20)
CO2: 24 mmol/L (ref 22–32)
Calcium: 9 mg/dL (ref 8.9–10.3)
Chloride: 108 mmol/L (ref 101–111)
Creatinine, Ser: 1.28 mg/dL — ABNORMAL HIGH (ref 0.44–1.00)
GFR calc Af Amer: 47 mL/min — ABNORMAL LOW (ref >60)
GFR calc non Af Amer: 40 mL/min — ABNORMAL LOW (ref >60)
Glucose, Bld: 104 mg/dL — ABNORMAL HIGH (ref 65–99)
Potassium: 4.4 mmol/L (ref 3.5–5.1)
Sodium: 140 mmol/L (ref 135–145)

## 2017-08-26 LAB — TSH: TSH: 5.618 u[IU]/mL — ABNORMAL HIGH (ref 0.350–4.500)

## 2017-08-26 MED ORDER — ONDANSETRON HCL 4 MG/2ML IJ SOLN: 4.0000 mg | Freq: Four times a day (QID) | INTRAMUSCULAR | Status: DC | PRN

## 2017-08-26 MED ORDER — TRAMADOL HCL 50 MG PO TABS
50.0000 mg | ORAL_TABLET | Freq: Four times a day (QID) | ORAL | Status: DC | PRN
  Administered 2017-08-26 (×2): 50 mg via ORAL
  Filled 2017-08-26 (×2): qty 1

## 2017-08-26 MED ORDER — APIXABAN 5 MG PO TABS
5.0000 mg | ORAL_TABLET | Freq: Two times a day (BID) | ORAL | Status: DC
  Administered 2017-08-26 – 2017-08-27 (×2): 5 mg via ORAL
  Filled 2017-08-26 (×2): qty 1

## 2017-08-26 MED ORDER — LEVOTHYROXINE SODIUM 25 MCG PO TABS
25.0000 ug | ORAL_TABLET | Freq: Every day | ORAL | Status: DC
  Administered 2017-08-27: 25 ug via ORAL
  Filled 2017-08-26: qty 1

## 2017-08-26 MED ORDER — NITROGLYCERIN 0.4 MG SL SUBL: 0.4000 mg | SUBLINGUAL_TABLET | SUBLINGUAL | Status: DC | PRN

## 2017-08-26 MED ORDER — ROSUVASTATIN CALCIUM 20 MG PO TABS
20.0000 mg | ORAL_TABLET | Freq: Every day | ORAL | Status: DC
  Administered 2017-08-26: 20 mg via ORAL
  Filled 2017-08-26: qty 1

## 2017-08-26 MED ORDER — ONDANSETRON HCL 4 MG PO TABS: 4.0000 mg | ORAL_TABLET | Freq: Four times a day (QID) | ORAL | Status: DC | PRN

## 2017-08-26 MED ORDER — UMECLIDINIUM BROMIDE 62.5 MCG/INH IN AEPB
1.0000 | INHALATION_SPRAY | Freq: Every day | RESPIRATORY_TRACT | Status: DC
  Administered 2017-08-27: 1 via RESPIRATORY_TRACT
  Filled 2017-08-26: qty 7

## 2017-08-26 MED ORDER — ACETAMINOPHEN 650 MG RE SUPP: 650.0000 mg | Freq: Four times a day (QID) | RECTAL | Status: DC | PRN

## 2017-08-26 MED ORDER — POLYETHYLENE GLYCOL 3350 17 G PO PACK: 17.0000 g | PACK | Freq: Every day | ORAL | Status: DC | PRN

## 2017-08-26 MED ORDER — FERROUS SULFATE 325 (65 FE) MG PO TABS
325.0000 mg | ORAL_TABLET | Freq: Every day | ORAL | Status: DC
  Administered 2017-08-27: 325 mg via ORAL
  Filled 2017-08-26: qty 1

## 2017-08-26 MED ORDER — SODIUM CHLORIDE 0.9 % IV SOLN
INTRAVENOUS | Status: AC
  Administered 2017-08-26: 18:00:00 via INTRAVENOUS

## 2017-08-26 MED ORDER — ACETAMINOPHEN 325 MG PO TABS: 650.0000 mg | ORAL_TABLET | Freq: Four times a day (QID) | ORAL | Status: DC | PRN

## 2017-08-26 MED ORDER — FLUTICASONE FUROATE-VILANTEROL 100-25 MCG/INH IN AEPB
1.0000 | INHALATION_SPRAY | Freq: Every day | RESPIRATORY_TRACT | Status: DC
  Filled 2017-08-26: qty 28

## 2017-08-26 MED ORDER — AMLODIPINE BESYLATE 5 MG PO TABS: 5.0000 mg | ORAL_TABLET | Freq: Every day | ORAL | Status: DC

## 2017-08-26 MED ORDER — BUPROPION HCL ER (XL) 300 MG PO TB24
300.0000 mg | ORAL_TABLET | Freq: Every day | ORAL | Status: DC
  Administered 2017-08-27: 300 mg via ORAL
  Filled 2017-08-26: qty 1

## 2017-08-26 MED ORDER — FLUTICASONE-UMECLIDIN-VILANT 100-62.5-25 MCG/INH IN AEPB: 1.0000 | INHALATION_SPRAY | Freq: Every day | RESPIRATORY_TRACT | Status: DC

## 2017-08-26 MED ORDER — ASPIRIN EC 81 MG PO TBEC
81.0000 mg | DELAYED_RELEASE_TABLET | Freq: Once | ORAL | Status: DC
  Filled 2017-08-26: qty 1

## 2017-08-26 NOTE — H&P (Addendum)
History and Physical    Patricia Jackson LHT:[id number] DOB: [birth year] DOA: 08/26/2017  PCP: Celene Squibb, MD   Patient coming from: home     Chief Complaint: chest pain  HPI: Patricia Jackson is a 77 y.o. female with medical history significant of her Patricia Jackson artery disease status post drug-eluting stent to proximal circumflex due to NSTEMI in 2017, atrial flutter on apixaban, stage III CKD, grade 2 diastolic dysfunction by echo on 10/29/2016, COPD, hypertension, anxiety, hyperlipidemia, hypothyroidism who comes in with syncopal episode.  Patient reports that her anginal equivalent is pain primarily down her right arm but as well as radiating over her shoulders and down her left arm as well.  She reports that she has had over the past month more frequent episodes including several times a week and sometimes several times a day.  This is an increase from the prior amount that she has had which was only once a week.  She has been hesitant to talk to her cardiologist as she is concerned that they will not care as the pain usually resolves by taking 1 sublingual nitro glycerin.  She reports that she had another episode of this pain down her right arm and left arm as well as some vague chest pain and she took 2 sublingual nitro.  The pain resolved but immediately afterwards she became orthostatic and felt warmth to her face.  She subsequently had a syncopal episode and awoke immediately one being found on the ground.  She did not hit her head.  Not have any loss of continence of bladder or bowel.  She was not confused afterwards.  She did not have any palpitations before or after the event.  Her chest pain is completely resolved by now.  She denies any abdominal pain, nausea, vomiting, diarrhea, orthopnea, lower extremity edema, PND, rash, prior syncope [place] fevers.  ED Course: In the ED patient's vitals were stable.  Labs were unremarkable.  Creatinine was actually improved from baseline at 1.28.,  CBC was  normal.  Troponin t was negative.  Review of Systems: As per HPI otherwise 10 point review of systems negative.   Past Medical History:  Diagnosis Date  . Atrial flutter ([place])    On Eliquis in 8/17 but discontinued after stent placement  . Breast cancer ([place])    remote  . CAD in native artery 03/16/2016  . COPD (chronic obstructive pulmonary disease) ([place])   . Hypertension   . S/P angioplasty with stent 03/15/16 DES Resolute, [place] 03/16/2016    Past Surgical History:  Procedure Laterality Date  . APPENDECTOMY    . BREAST SURGERY Left   . CARDIAC CATHETERIZATION N/A 03/15/2016   Procedure: Left Heart Cath and Coronary Angiography;  Surgeon: Belva Crome, MD;  Location: [facility];  Service: Cardiovascular;  Laterality: N/A;  . CARDIAC CATHETERIZATION N/A 03/15/2016   Procedure: Coronary Stent Intervention;  Surgeon: Belva Crome, MD;  Location: [facility];  Service: Cardiovascular;  Laterality: N/A;  . COLONOSCOPY     remote  . COLONOSCOPY N/A 05/26/2016   Procedure: COLONOSCOPY;  Surgeon: Daneil Dolin, MD;  Location: [facility];  Service: Endoscopy;  Laterality: N/A;  845  . ESOPHAGOGASTRODUODENOSCOPY N/A 05/26/2016   Procedure: ESOPHAGOGASTRODUODENOSCOPY (EGD);  Surgeon: Daneil Dolin, MD;  Location: [facility];  Service: Endoscopy;  Laterality: N/A;  . Venia Minks DILATION N/A 05/26/2016   Procedure: Venia Minks DILATION;  Surgeon: Daneil Dolin, MD;  [facility];  Service: Endoscopy;  Laterality: N/A;     reports that she quit smoking about 21 months ago. Her smoking use included cigarettes. She started smoking about 51 years ago. She has a 56.00 pack-year smoking history. She has never used smokeless tobacco. She reports that she does not drink alcohol or use drugs.  Allergies  Allergen Reactions  . Sulfa Antibiotics Hives    Family History  Problem Relation Age of Onset  . Heart disease Mother   . COPD Father   . Cancer Sister         unknown primary  . Cancer Brother        unknown primary  . Colon cancer Neg Hx      Prior to Admission medications   Medication Sig Start Date End Date Taking? Authorizing Provider  amLODipine (NORVASC) 5 MG tablet  06/07/17  Yes [provider]  apixaban (ELIQUIS) 5 MG TABS tablet Take 1 tablet (5 mg total) by mouth 2 (two) times daily. 06/03/17  Yes BranchAlphonse Guild, MD  aspirin EC 81 MG tablet Take 324 mg by mouth once.   Yes [provider]  buPROPion (WELLBUTRIN XL) 300 MG 24 hr tablet  01/21/17  Yes [provider]  ferrous sulfate 325 (65 FE) MG tablet Take 325 mg by mouth daily with breakfast.  04/08/16  Yes [provider]  Fluticasone-Umeclidin-Vilant (TRELEGY ELLIPTA) 100-62.5-25 MCG/INH AEPB Inhale 1 puff into the lungs daily. 07/29/17  Yes Mannam, Praveen, MD  levothyroxine (SYNTHROID, LEVOTHROID) 25 MCG tablet Take 25 mcg by mouth daily before breakfast.   Yes [provider]  nitroGLYCERIN (NITROSTAT) 0.4 MG SL tablet Place 0.8 mg under the tongue every 5 (five) minutes as needed.  01/26/17  Yes [provider]  rosuvastatin (CRESTOR) 20 MG tablet  02/26/17  Yes [provider]    Physical Exam: Vitals:   08/26/17 1130 08/26/17 1145 08/26/17 1200 08/26/17 1230  BP:   (!) 152/88 (!) 165/95  Pulse: 69 72 73 77  Resp: (!) 24 (!) 31 18 14   Temp:      TempSrc:      SpO2: 93% 100% 97% 100%  Weight:      Height:        Constitutional: NAD, calm, comfortable Vitals:   08/26/17 1130 08/26/17 1145 08/26/17 1200 08/26/17 1230  BP:   (!) 152/88 (!) 165/95  Pulse: 69 72 73 77  Resp: (!) 24 (!) 31 18 14   Temp:      TempSrc:      SpO2: 93% 100% 97% 100%  Weight:      Height:       Eyes: Anicteric sclera ENMT: Dry mucous membranes.  Neck: normal, supple Respiratory: clear to auscultation bilaterally, no wheezing, no crackles. Normal respiratory effort. No accessory muscle use.  Cardiovascular: Regular rate and  rhythm, no murmurs.  Abdomen: no tenderness, no masses palpated. No hepatosplenomegaly. Bowel sounds positive.  Musculoskeletal: Lower extremity edema Skin: no rashes visible skin Neurologic: Grossly intact, moving all extremities Psychiatric: Normal judgment and insight. Alert and oriented x 3.  Appears quite anxious  Labs on Admission: I have personally reviewed following labs and imaging studies  CBC: Recent Labs  Lab 08/26/17 1100  WBC 7.5  NEUTROABS 5.1  HGB 12.2  HCT 38.3  MCV 93.9  PLT 283*   Basic Metabolic Panel: Recent Labs  Lab 08/26/17 1100  NA 140  K 4.4  CL 108  CO2 24  GLUCOSE 104*  BUN  23*  CREATININE 1.28*  CALCIUM 9.0   GFR: Estimated Creatinine Clearance: 36.1 mL/min (A) (by C-G formula based on SCr of 1.28 mg/dL (H)). Liver Function Tests: No results for input(s): AST, ALT, ALKPHOS, BILITOT, PROT, ALBUMIN in the last 168 hours. No results for input(s): LIPASE, AMYLASE in the last 168 hours. No results for input(s): AMMONIA in the last 168 hours. Coagulation Profile: No results for input(s): INR, PROTIME in the last 168 hours. Cardiac Enzymes: Recent Labs  Lab 08/26/17 1100  TROPONINI <0.03   BNP (last 3 results) No results for input(s): PROBNP in the last 8760 hours. HbA1C: No results for input(s): HGBA1C in the last 72 hours. CBG: No results for input(s): GLUCAP in the last 168 hours. Lipid Profile: No results for input(s): CHOL, HDL, LDLCALC, TRIG, CHOLHDL, LDLDIRECT in the last 72 hours. Thyroid Function Tests: No results for input(s): TSH, T4TOTAL, FREET4, T3FREE, THYROIDAB in the last 72 hours. Anemia Panel: No results for input(s): VITAMINB12, FOLATE, FERRITIN, TIBC, IRON, RETICCTPCT in the last 72 hours. Urine analysis:    Component Value Date/Time   COLORURINE YELLOW 08/26/2017 1022   APPEARANCEUR HAZY (A) 08/26/2017 1022   LABSPEC 1.020 08/26/2017 1022   PHURINE 6.0 08/26/2017 1022   GLUCOSEU NEGATIVE 08/26/2017 1022    HGBUR NEGATIVE 08/26/2017 1022   BILIRUBINUR NEGATIVE 08/26/2017 1022   [place] 08/26/2017 1022   PROTEINUR NEGATIVE 08/26/2017 1022   NITRITE NEGATIVE 08/26/2017 1022   LEUKOCYTESUR SMALL (A) 08/26/2017 1022    Radiological Exams on Admission: Dg Chest 2 View  Result Date: 08/26/2017 CLINICAL DATA:  Bilateral arm pain EXAM: CHEST - 2 VIEW COMPARISON:  05/19/2017 FINDINGS: The lungs are hyperinflated likely secondary to COPD. There is no focal parenchymal opacity. There is no pleural effusion or pneumothorax. The heart and mediastinal contours are unremarkable. The osseous structures are unremarkable. IMPRESSION: No active cardiopulmonary disease. Electronically Signed   By: Kathreen Devoid   On: 08/26/2017 11:48   Ct Head Wo Contrast  Result Date: 08/26/2017 CLINICAL DATA:  Syncope, bilateral upper extremity pain. EXAM: CT HEAD WITHOUT CONTRAST TECHNIQUE: Contiguous axial images were obtained from the base of the skull through the vertex without intravenous contrast. COMPARISON:  None. FINDINGS: Brain: No evidence of acute infarction, hemorrhage, hydrocephalus, extra-axial collection or mass lesion/mass effect. Vascular: No hyperdense vessel or unexpected calcification. Skull: Normal. Negative for fracture or focal lesion. Sinuses/Orbits: No acute finding. Other: None. IMPRESSION: Normal head CT. Electronically Signed   By: Marijo Conception, M.D.   On: 08/26/2017 12:04    EKG: Independently reviewed.  Sinus rhythm, no acute ST segment changes, unchanged from prior  Assessment/Plan Active Problems:   Hypertension   Atrial flutter with rapid ventricular response (HCC)   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HCC)   Dyslipidemia   NSTEMI (non-ST elevated myocardial infarction) ([place])   CAD in native artery   S/P angioplasty with stent 03/15/16 DES Resolute, [facility]   CKD (chronic kidney disease), stage IV (HCC)   COPD (chronic obstructive pulmonary disease) ([place])   Depression   #) Syncope: Likely in  the setting of taking too much sublingual nitroglycerin.  Currently her blood pressures are if anything high.  She does not have any evidence of this syncope from a severe arrhythmia or from a coronary artery event. -Monitor on telemetry -Gentle IV fluids -Echo  #) Coronary artery disease status post stenting: At this time she describes what seems to be fairly consistent with unstable angina or at least progression of her coronary  artery disease.  Her anginal equivalent of the right arm pain that extends over her shoulders and her left arm appears to be happening more frequently and her use of nitroglycerin has increased dramatically.  She unfortunately has not discussed this with her cardiologist as she is I believe at least avoiding the possibility that she might need another intervention. -Continue aspirin 81 mg -Cardiology consult - Follow-up echo -Continue statin -Unclear why patient is not on beta-blocker as her COPD is not a contraindication to this -Continue sublingual nitro -Cycle troponins  #)  atrial flutter: -Continue apixaban 5 mg twice daily  #) Gold stage II COPD: PFTs done on 05/31/2016 show an FEV1 of 54%, FEV1 to FVC ratio of 65%. -Continue PRN bronchodilators - Continue L ABA/L AMA/ICS  #) Hypertension/hyperlipidemia: -Continue amlodipine 5 mg daily -Continue rosuvastatin 20 mg daily   #) Hyperthyroidism: -Continue levothyroxine 25 mcg daily  #) Stage III CKD: -Stable  #) Psych: -Continue bupropion 300 mg daily  Fluids: Gentle IV fluids Elect lites: Monitor and supplement Nutrition: Heart healthy diet  Prophylaxis: On apixaban  Disposition: Pending echo and monitoring on telemetry and cycling troponins  Full code  Cristy Folks MD [facility]   If 7PM-7AM, please contact night-coverage [email or web address] Password [email or web address]  08/26/2017, 1:10 PM

## 2017-08-26 NOTE — ED Triage Notes (Signed)
Pt brought in by [facility] with c/o syncopal episode after having bilateral arm pain that radiated to the back with diaphoresis. Denied chest pain. Pt took 2 Nitro at home and was still having chest pain and then encountered syncopal episode. EMS gave 324 ASA. Pt denies any pain at this time. Pt reports she scraped her left knee on a baby crib when she fell, but no other pain. Pt alert and oriented in triage.

## 2017-08-26 NOTE — ED Provider Notes (Signed)
[facility] EMERGENCY DEPARTMENT Provider Note   CSN: [id number] Arrival date & time: 08/26/17  1014     History   Chief Complaint Chief Complaint  Patient presents with  . Loss of Consciousness    HPI Patricia Jackson is a 77 y.o. female.  HPI  Pt was seen at 1105.  Per pt, c/o sudden onset and resolution of one brief episode of syncope that occurred PTA. Pt states she had her "usual" cardiac pain (arms pain radiating into her back) this morning. Pt states she took 2 of her own SL ntg. Pt states she "then just passed out." CP was associated with diaphoresis. Denies prodromal symptoms before syncope. Denies any change in her usual chronic/recurrent CP. Denies palpitations, no SOB/cough, no abd pain, no N/V/D, no headache, no focal motor weakness, no tingling/numbness in extremities.   Past Medical History:  Diagnosis Date  . Atrial flutter ([place])    On Eliquis in 8/17 but discontinued after stent placement  . Breast cancer ([place])    remote  . CAD in native artery 03/16/2016  . COPD (chronic obstructive pulmonary disease) ([place])   . Hypertension   . S/P angioplasty with stent 03/15/16 DES Resolute, pLCX 03/16/2016    Patient Active Problem List   Diagnosis Date Noted  . Atrial flutter ([place]) 10/28/2016  . CKD (chronic kidney disease), stage IV ([place]) 10/28/2016  . COPD (chronic obstructive pulmonary disease) ([place]) 10/28/2016  . Depression 10/28/2016  . AKI (acute kidney injury) ([place]) 09/08/2016  . Hyperglycemia 09/08/2016  . Esophageal dysphagia   . Anemia 04/07/2016  . Sinus bradycardia   . Hx of heart artery stent   . CAD in native artery 03/16/2016  . S/P angioplasty with stent 03/15/16 DES Resolute, [place] 03/16/2016  . Acute coronary syndrome ([place])   . NSTEMI (non-ST elevated myocardial infarction) ([place]) 03/12/2016  . Shortness of breath   . Acute respiratory failure with hypoxia ([place])   . Hypophosphatemia   . 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[place]) 10/31/2015  . Acute exacerbation  of chronic bronchitis ([place]) 10/31/2015  . COPD exacerbation ([place]) 10/31/2015  . Dyslipidemia 10/31/2015  . Elevated troponin 10/31/2015  . Atrial flutter with rapid ventricular response ([place]) 10/30/2015  . SOB (shortness of breath) 11/26/2013  . Back pain 11/26/2013  . Arm pain 11/26/2013  . Chest pain 11/26/2013  . Hypertension     Past Surgical History:  Procedure Laterality Date  . APPENDECTOMY    . BREAST SURGERY Left   . CARDIAC CATHETERIZATION N/A 03/15/2016   Procedure: Left Heart Cath and Coronary Angiography;  Surgeon: Belva Crome, MD;  Location: [place] [facility];  Service: Cardiovascular;  Laterality: N/A;  . CARDIAC CATHETERIZATION N/A 03/15/2016   Procedure: Coronary Stent Intervention;  Surgeon: Belva Crome, MD;  Location: [place] CV LAB;  Service: Cardiovascular;  Laterality: N/A;  . COLONOSCOPY     remote  . COLONOSCOPY N/A 05/26/2016   Procedure: COLONOSCOPY;  Surgeon: Daneil Dolin, MD;  Location: [facility];  Service: Endoscopy;  Laterality: N/A;  845  . ESOPHAGOGASTRODUODENOSCOPY N/A 05/26/2016   Procedure: ESOPHAGOGASTRODUODENOSCOPY (EGD);  Surgeon: Daneil Dolin, MD;  Location: [facility];  Service: Endoscopy;  Laterality: N/A;  Venia Minks DILATION N/A 05/26/2016   Procedure: Venia Minks DILATION;  Surgeon: Daneil Dolin, MD;  Location: [facility];  Service: Endoscopy;  Laterality: N/A;     OB History    Gravida      Para      Term  Preterm      AB      Living  1     SAB      TAB      Ectopic      Multiple      Live Births               Home Medications    Prior to Admission medications   Medication Sig Start Date End Date Taking? Authorizing Provider  amLODipine (NORVASC) 5 MG tablet  06/07/17  Yes [provider]  apixaban (ELIQUIS) 5 MG TABS tablet Take 1 tablet (5 mg total) by mouth 2 (two) times daily. 06/03/17  Yes BranchAlphonse Guild, MD  aspirin EC 81 MG tablet Take 324 mg by mouth once.   Yes  [provider]  buPROPion (WELLBUTRIN XL) 300 MG 24 hr tablet  01/21/17  Yes [provider]  ferrous sulfate 325 (65 FE) MG tablet Take 325 mg by mouth daily with breakfast.  04/08/16  Yes [provider]  Fluticasone-Umeclidin-Vilant (TRELEGY ELLIPTA) 100-62.5-25 MCG/INH AEPB Inhale 1 puff into the lungs daily. 07/29/17  Yes Mannam, Praveen, MD  levothyroxine (SYNTHROID, LEVOTHROID) 25 MCG tablet Take 25 mcg by mouth daily before breakfast.   Yes [provider]  nitroGLYCERIN (NITROSTAT) 0.4 MG SL tablet Place 0.8 mg under the tongue every 5 (five) minutes as needed.  01/26/17  Yes [provider]  rosuvastatin (CRESTOR) 20 MG tablet  02/26/17  Yes [provider]    Family History Family History  Problem Relation Age of Onset  . Heart disease Mother   . COPD Father   . Cancer Sister        unknown primary  . Cancer Brother        unknown primary  . Colon cancer Neg Hx     Social History Social History   Tobacco Use  . Smoking status: Former Smoker    Packs/day: 1.00    Years: 56.00    Pack years: 56.00    Types: Cigarettes    Start date: 12/15/1965    Last attempt to quit: 10/29/2015    Years since quitting: 1.8  . Smokeless tobacco: Never Used  Substance Use Topics  . Alcohol use: No    Alcohol/week: 0.0 oz  . Drug use: No     Allergies   Sulfa antibiotics   Review of Systems Review of Systems ROS: Statement: All systems negative except as marked or noted in the HPI; Constitutional: Negative for fever and chills. ; ; Eyes: Negative for eye pain, redness and discharge. ; ; ENMT: Negative for ear pain, hoarseness, nasal congestion, sinus pressure and sore throat. ; ; Cardiovascular: +CP, diaphoresis. Negative for palpitations, dyspnea and peripheral edema. ; ; Respiratory: Negative for cough, wheezing and stridor. ; ; Gastrointestinal: Negative for nausea, vomiting, diarrhea, abdominal pain, blood in stool,  hematemesis, jaundice and rectal bleeding. . ; ; Genitourinary: Negative for dysuria, flank pain and hematuria. ; ; Musculoskeletal: Negative for back pain and neck pain. Negative for swelling and trauma.; ; Skin: Negative for pruritus, rash, abrasions, blisters, bruising and skin lesion.; ; Neuro: Negative for headache, lightheadedness and neck stiffness. Negative for weakness, altered level of consciousness, extremity weakness, paresthesias, involuntary movement, seizure and +syncope.      Physical Exam Updated Vital Signs BP (!) 152/88   Pulse 73   Temp 97.7 F (36.5 C) (Oral)   Resp 18   Ht 5' 6"  (1.676 m)   Wt  64.4 kg (142 lb)   SpO2 97%   BMI 22.92 kg/m    11:22 Orthostatic Vital Signs [place]  Orthostatic Lying   BP- Lying: 134/72   Pulse- Lying: 70       Orthostatic Sitting  BP- Sitting: 143/71   Pulse- Sitting: 76       Orthostatic Standing at 0 minutes  BP- Standing at 0 minutes: 122/69   Pulse- Standing at 0 minutes: 86      Physical Exam 1110: Physical examination:  Nursing notes reviewed; Vital signs and O2 SAT reviewed;  Constitutional: Well developed, Well nourished, Well hydrated, In no acute distress; Head:  Normocephalic, atraumatic; Eyes: EOMI, PERRL, No scleral icterus; ENMT: Mouth and pharynx normal, Mucous membranes moist; Neck: Supple, Full range of motion, No lymphadenopathy; Cardiovascular: Regular rate and rhythm, No gallop; Respiratory: Breath sounds clear & equal bilaterally, No wheezes.  Speaking full sentences with ease, Normal respiratory effort/excursion; Chest: Nontender, Movement normal; Abdomen: Soft, Nontender, Nondistended, Normal bowel sounds; Genitourinary: No CVA tenderness; Extremities: Peripheral pulses normal, No tenderness, No edema, No calf edema or asymmetry.; Neuro: AA&Ox3, Major CN grossly intact. No facial droop. Speech clear. No gross focal motor or sensory deficits in extremities.; Skin: Color normal, Warm, Dry.   ED Treatments /  Results  Labs (all labs ordered are listed, but only abnormal results are displayed)   EKG EKG Interpretation  Date/Time:  Friday Aug 26 2017 10:24:16 EDT Ventricular Rate:  74 PR Interval:    QRS Duration: 102 QT Interval:  426 QTC Calculation: 473 R Axis:   84 Text Interpretation:  Sinus rhythm Atrial premature complex Borderline right axis deviation When compared with ECG of 10/29/2016 No significant change was found Confirmed by Francine Graven [phone]) on 08/26/2017 12:06:23 PM   Radiology   Procedures Procedures (including critical care time)  Medications Ordered in ED Medications - No data to display   Initial Impression / Assessment and Plan / ED Course  I have reviewed the triage vital signs and the nursing notes.  Pertinent labs & imaging results that were available during my care of the patient were reviewed by me and considered in my medical decision making (see chart for details).  MDM Reviewed: previous chart, nursing note and vitals Reviewed previous: labs and ECG Interpretation: labs, ECG, x-ray and CT scan   Results for orders placed or performed during the hospital encounter of 16/07/37  Basic metabolic panel  Result Value Ref Range   Sodium 140 135 - 145 mmol/L   Potassium 4.4 3.5 - 5.1 mmol/L   Chloride 108 101 - 111 mmol/L   CO2 24 22 - 32 mmol/L   Glucose, Bld 104 (H) 65 - 99 mg/dL   BUN 23 (H) 6 - 20 mg/dL   Creatinine, Ser 1.28 (H) 0.44 - 1.00 mg/dL   Calcium 9.0 8.9 - 10.3 mg/dL   GFR calc non Af Amer 40 (L) >60 mL/min   GFR calc Af Amer 47 (L) >60 mL/min   Anion gap 8 5 - 15  CBC  Result Value Ref Range   WBC 7.5 4.0 - 10.5 K/uL   RBC 4.08 3.87 - 5.11 MIL/uL   Hemoglobin 12.2 12.0 - 15.0 g/dL   HCT 38.3 36.0 - 46.0 %   MCV 93.9 78.0 - 100.0 fL   MCH 29.9 26.0 - 34.0 pg   MCHC 31.9 30.0 - 36.0 g/dL   RDW 13.9 11.5 - 15.5 %   Platelets 143 (L) 150 - 400 K/uL  Urinalysis, Routine w reflex microscopic  Result Value Ref Range    Color, Urine YELLOW YELLOW   APPearance HAZY (A) CLEAR   Specific Gravity, Urine 1.020 1.005 - 1.030   pH 6.0 5.0 - 8.0   Glucose, UA NEGATIVE NEGATIVE mg/dL   Hgb urine dipstick NEGATIVE NEGATIVE   Bilirubin Urine NEGATIVE NEGATIVE   Ketones, ur NEGATIVE NEGATIVE mg/dL   Protein, ur NEGATIVE NEGATIVE mg/dL   Nitrite NEGATIVE NEGATIVE   Leukocytes, UA SMALL (A) NEGATIVE  Troponin I  Result Value Ref Range   Troponin I <0.03 <0.03 ng/mL  Differential  Result Value Ref Range   Neutrophils Relative % 69 %   Neutro Abs 5.1 1.7 - 7.7 K/uL   Lymphocytes Relative 22 %   Lymphs Abs 1.6 0.7 - 4.0 K/uL   Monocytes Relative 7 %   Monocytes Absolute 0.5 0.1 - 1.0 K/uL   Eosinophils Relative 3 %   Eosinophils Absolute 0.2 0.0 - 0.7 K/uL   Basophils Relative 1 %   Basophils Absolute 0.0 0.0 - 0.1 K/uL  Urinalysis, Microscopic (reflex)  Result Value Ref Range   RBC / HPF 0-5 0 - 5 RBC/hpf   WBC, UA 0-5 0 - 5 WBC/hpf   Bacteria, UA RARE (A) NONE SEEN   Squamous Epithelial / LPF 0-5 0 - 5   Hyaline Casts, UA PRESENT    Dg Chest 2 View Result Date: 08/26/2017 CLINICAL DATA:  Bilateral arm pain EXAM: CHEST - 2 VIEW COMPARISON:  05/19/2017 FINDINGS: The lungs are hyperinflated likely secondary to COPD. There is no focal parenchymal opacity. There is no pleural effusion or pneumothorax. The heart and mediastinal contours are unremarkable. The osseous structures are unremarkable. IMPRESSION: No active cardiopulmonary disease. Electronically Signed   By: Kathreen Devoid   On: 08/26/2017 11:48   Ct Head Wo Contrast Result Date: 08/26/2017 CLINICAL DATA:  Syncope, bilateral upper extremity pain. EXAM: CT HEAD WITHOUT CONTRAST TECHNIQUE: Contiguous axial images were obtained from the base of the skull through the vertex without intravenous contrast. COMPARISON:  None. FINDINGS: Brain: No evidence of acute infarction, hemorrhage, hydrocephalus, extra-axial collection or mass lesion/mass effect. Vascular: No  hyperdense vessel or unexpected calcification. Skull: Normal. Negative for fracture or focal lesion. Sinuses/Orbits: No acute finding. Other: None. IMPRESSION: Normal head CT. Electronically Signed   By: Marijo Conception, M.D.   On: 08/26/2017 12:04    1210:  Not orthostatic on VS. Workup reassuring. CP symptom per baseline pain pattern.  T/C returned from [facility] Dr. Domenic Polite, case discussed, including:  HPI, pertinent PM/SHx, VS/PE, dx testing, ED course and treatment:  OK to stay at [facility] on tele, cycle enzymes and monitor for arrhythmias, Cards can f/u in office next week for any possible further studies (ie: stress test, etc).    1235:  T/C returned from [facility] Dr. Herbert Moors, case discussed, including:  HPI, pertinent PM/SHx, VS/PE, dx testing, ED course and treatment:  Agreeable to admit.    Final Clinical Impressions(s) / ED Diagnoses   Final diagnoses:  None    ED Discharge Orders    None       Francine Graven, DO 08/29/17 [id number]

## 2017-08-26 NOTE — ED Notes (Signed)
C/o of slight twitch during CXR, denies any pain or SOB.  Dr Thurnell Garbe informed.  No changes in EKG.  BP 152/88

## 2017-08-27 ENCOUNTER — Observation Stay (HOSPITAL_BASED_OUTPATIENT_CLINIC_OR_DEPARTMENT_OTHER): Payer: Medicare Other

## 2017-08-27 DIAGNOSIS — I340 Nonrheumatic mitral (valve) insufficiency: Secondary | ICD-10-CM | POA: Diagnosis not present

## 2017-08-27 DIAGNOSIS — R0789 Other chest pain: Secondary | ICD-10-CM | POA: Diagnosis present

## 2017-08-27 DIAGNOSIS — I951 Orthostatic hypotension: Secondary | ICD-10-CM | POA: Diagnosis not present

## 2017-08-27 DIAGNOSIS — R55 Syncope and collapse: Secondary | ICD-10-CM | POA: Diagnosis not present

## 2017-08-27 LAB — BASIC METABOLIC PANEL
Anion gap: 6 (ref 5–15)
BUN: 30 mg/dL — ABNORMAL HIGH (ref 6–20)
CO2: 25 mmol/L (ref 22–32)
Calcium: 9.1 mg/dL (ref 8.9–10.3)
Chloride: 110 mmol/L (ref 101–111)
Creatinine, Ser: 1.2 mg/dL — ABNORMAL HIGH (ref 0.44–1.00)
GFR calc Af Amer: 50 mL/min — ABNORMAL LOW (ref >60)
GFR calc non Af Amer: 43 mL/min — ABNORMAL LOW (ref >60)
Glucose, Bld: 95 mg/dL (ref 65–99)
Potassium: 4.7 mmol/L (ref 3.5–5.1)
Sodium: 141 mmol/L (ref 135–145)

## 2017-08-27 LAB — CBC
HCT: 36.8 % (ref 36.0–46.0)
Hemoglobin: 11.6 g/dL — ABNORMAL LOW (ref 12.0–15.0)
MCH: 29.4 pg (ref 26.0–34.0)
MCHC: 31.5 g/dL (ref 30.0–36.0)
MCV: 93.4 fL (ref 78.0–100.0)
Platelets: 174 10*3/uL (ref 150–400)
RBC: 3.94 MIL/uL (ref 3.87–5.11)
RDW: 13.9 % (ref 11.5–15.5)
WBC: 6.8 10*3/uL (ref 4.0–10.5)

## 2017-08-27 LAB — ECHOCARDIOGRAM COMPLETE
Height: 66 in
Weight: 2328.06 oz

## 2017-08-27 LAB — TROPONIN I: Troponin I: <0.03 ng/mL (ref <0.03)

## 2017-08-27 MED ORDER — METOPROLOL SUCCINATE ER 25 MG PO TB24: 25.0000 mg | ORAL_TABLET | Freq: Every day | ORAL | 0 refills | Status: DC

## 2017-08-27 MED ORDER — IBUPROFEN 400 MG PO TABS: 200.0000 mg | ORAL_TABLET | Freq: Three times a day (TID) | ORAL | 0 refills | Status: AC | PRN

## 2017-08-27 MED ORDER — NITROGLYCERIN 0.4 MG SL SUBL: 0.4000 mg | SUBLINGUAL_TABLET | SUBLINGUAL | 0 refills | Status: DC | PRN

## 2017-08-27 MED ORDER — METOPROLOL SUCCINATE ER 25 MG PO TB24
25.0000 mg | ORAL_TABLET | Freq: Every day | ORAL | Status: DC
  Administered 2017-08-27: 25 mg via ORAL
  Filled 2017-08-27: qty 1

## 2017-08-27 NOTE — Discharge Instructions (Signed)
Orthostatic Hypotension Orthostatic hypotension is a sudden drop in blood pressure that happens when you quickly change positions, such as when you get up from a seated or lying position. Blood pressure is a measurement of how strongly, or weakly, your blood is pressing against the walls of your arteries. Arteries are blood vessels that carry blood from your heart throughout your body. When blood pressure is too low, you may not get enough blood to your brain or to the rest of your organs. This can cause weakness, light-headedness, rapid heartbeat, and fainting. This can last for just a few seconds or for up to a few minutes. Orthostatic hypotension is usually not a serious problem. However, if it happens frequently or gets worse, it may be a sign of something more serious. What are the causes? This condition may be caused by:  Sudden changes in posture, such as standing up quickly after you have been sitting or lying down.  Blood loss.  Loss of body fluids (dehydration).  Heart problems.  Hormone (endocrine) problems.  Pregnancy.  Severe infection.  Lack of certain nutrients.  Severe allergic reactions (anaphylaxis).  Certain medicines, such as blood pressure medicine or medicines that make the body lose excess fluids (diuretics). Sometimes, this condition can be caused by not taking medicine as directed, such as taking too much of a certain medicine.  What increases the risk? Certain factors can make you more likely to develop orthostatic hypotension, including:  Age. Risk increases as you get older.  Conditions that affect the heart or the central nervous system.  Taking certain medicines, such as blood pressure medicine or diuretics.  Being pregnant.  What are the signs or symptoms? Symptoms of this condition may include:  Weakness.  Light-headedness.  Dizziness.  Blurred vision.  Fatigue.  Rapid heartbeat.  Fainting, in severe cases.  How is this  diagnosed? This condition is diagnosed based on:  Your medical history.  Your symptoms.  Your blood pressure measurement. Your health care provider will check your blood pressure when you are: ? Lying down. ? Sitting. ? Standing.  A blood pressure reading is recorded as two numbers, such as "120 over 80" (or 120/80). The first ("top") number is called the systolic pressure. It is a measure of the pressure in your arteries as your heart beats. The second ("bottom") number is called the diastolic pressure. It is a measure of the pressure in your arteries when your heart relaxes between beats. Blood pressure is measured in a unit called mm Hg. Healthy blood pressure for adults is 120/80. If your blood pressure is below 90/60, you may be diagnosed with hypotension. Other information or tests that may be used to diagnose orthostatic hypotension include:  Your other vital signs, such as your heart rate and temperature.  Blood tests.  Tilt table test. For this test, you will be safely secured to a table that moves you from a lying position to an upright position. Your heart rhythm and blood pressure will be monitored during the test.  How is this treated? Treatment for this condition may include:  Changing your diet. This may involve eating more salt (sodium) or drinking more water.  Taking medicines to raise your blood pressure.  Changing the dosage of certain medicines you are taking that might be lowering your blood pressure.  Wearing compression stockings. These stockings help to prevent blood clots and reduce swelling in your legs.  In some cases, you may need to go to the hospital for:    Fluid replacement. This means you will receive fluids through an IV tube.  Blood replacement. This means you will receive donated blood through an IV tube (transfusion).  Treating an infection or heart problems, if this applies.  Monitoring. You may need to be monitored while medicines that you  are taking wear off.  Follow these instructions at home: Eating and drinking   Drink enough fluid to keep your urine clear or pale yellow.  Eat a healthy diet and follow instructions from your health care provider about eating or drinking restrictions. A healthy diet includes: ? Fresh fruits and vegetables. ? Whole grains. ? Lean meats. ? Low-fat dairy products.  Eat extra salt only as directed. Do not add extra salt to your diet unless your health care provider told you to do that.  Eat frequent, small meals.  Avoid standing up suddenly after eating. Medicines  Take over-the-counter and prescription medicines only as told by your health care provider. ? Follow instructions from your health care provider about changing the dosage of your current medicines, if this applies. ? Do not stop or adjust any of your medicines on your own. General instructions  Wear compression stockings as told by your health care provider.  Get up slowly from lying down or sitting positions. This gives your blood pressure a chance to adjust.  Avoid hot showers and excessive heat as directed by your health care provider.  Return to your normal activities as told by your health care provider. Ask your health care provider what activities are safe for you.  Do not use any products that contain nicotine or tobacco, such as cigarettes and e-cigarettes. If you need help quitting, ask your health care provider.  Keep all follow-up visits as told by your health care provider. This is important. Contact a health care provider if:  You vomit.  You have diarrhea.  You have a fever for more than 2-3 days.  You feel more thirsty than usual.  You feel weak and tired. Get help right away if:  You have chest pain.  You have a fast or irregular heartbeat.  You develop numbness in any part of your body.  You cannot move your arms or your legs.  You have trouble speaking.  You become sweaty or feel  lightheaded.  You faint.  You feel short of breath.  You have trouble staying awake.  You feel confused. This information is not intended to replace advice given to you by your health care provider. Make sure you discuss any questions you have with your health care provider. Document Released: 03/12/2002 Document Revised: 12/09/2015 Document Reviewed: 09/12/2015 Elsevier Interactive Patient Education  2018 [employer]  

## 2017-08-27 NOTE — Progress Notes (Signed)
*  PRELIMINARY RESULTS* Echocardiogram 2D Echocardiogram has been performed.  [name] 08/27/2017, 9:49 AM

## 2017-08-27 NOTE — Discharge Summary (Signed)
Physician Discharge Summary  Patricia Jackson WIO:[id number] DOB: 20-Feb-1941 DOA: 08/26/2017  PCP: Celene Squibb, MD  Admit date: 08/26/2017 Discharge date: 08/27/2017  Admitted From: Home Disposition: Home  Recommendations for Outpatient Follow-up:  1. Follow up with  cardiologist Dr. Harl Bowie in 1-2 weeks.  Home Health:None Equipment/Devices: None  Discharge Condition fair CODE STATUS: Full code Diet recommendation: Heart Healthy     Discharge Diagnoses:  Principal Problem:   Orthostatic syncope   Active Problems:    Atypical chest pain   Hypertension   Atrial flutter with rapid ventricular response (HCC)   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HCC)   Dyslipidemia   NSTEMI (non-ST elevated myocardial infarction) ([place])   CAD in native artery   S/P angioplasty with stent 03/15/16 DES Resolute, pLCX   CKD (chronic kidney disease), stage IV (HCC)   COPD (chronic obstructive pulmonary disease) (HCC)   Depression  Brief narrative/HPI 77-year-old female with significant coronary artery disease status post drug-eluting stent to proximal circumflex following NSTEMI in 2017, a flutter on Eliquis, stage III chronic kidney disease, grade 2 diastolic dysfunction, COPD, hypertension, anxiety, hyperlipidemia, hypothyroidism who presented with syncopal episode at home.  She reports having off-and-on chest pain radiating down her right arm and shoulder for which she was taking nitroglycerin with some relief.  Patient reports that the pain is across her right side of the chest and then radiates to the left shoulder and the arm.  She took 2 nitroglycerin after which she became lightheaded with flushing of her face and had a syncopal episode.  She does not remember the duration but feels it was few seconds.  Denied sustaining head injury or bowel or urinary incontinence.  Denied being confused afterwards or having palpitations. In the ED vitals were stable.  Blood work including troponin was unremarkable.  Head CT  was unremarkable.  EKG showed normal sinus rhythm. Placed on observation for further monitoring.  Hospital course Orthostatic syncope Suspect this is due to taking extra dose of nitroglycerin.  She reports taking 2 tablets of nitroglycerin when she had chest pain episodes.  Given IV fluids and subsequent orthostatic vitals were normal. Serial troponin have been negative and she is stable on telemetry.  2D echo shows normal EF of 60-65% with grade 1 diastolic dysfunction and no wall motion abnormality. I will discontinue amlodipine and have started on low-dose Toprol-XL.  Atypical chest pain I suspect this is more musculoskeletal than angina equivalent.  Patient was seen by her cardiologist 3 months back and also thought that her chest pain was atypical.  Patient describes her pain to be radiating from the right chest and the right shoulder towards the left side and then down her arm.  I have instructed her to minimize use of nitro glycerin and try either Tylenol along with Motrin if she has similar pain.  She needs to see her cardiologist in 1 week.  Serial troponin have been negative and 2D echo reviewed.  Paroxysmal A. fib Rate controlled.  Continue Eliquis.  Added low-dose Toprol-XL.  Coronary artery disease with history of stenting. Continue aspirin.  Patient was previously on Toprol-XL 50 mg which was reduced to 25 mg after she was found to have low heart rate (this was when she was hospitalized in August 2018).  I will place her back on 25 mg of Toprol-XL and continue aspirin and statin.  Hypothyroidism Reports being recently started on Synthroid.  TSH greater than 5.  Continue current dose.  Depression Continue bupropion.  Stage III  chronic kidney disease Stable.   Family communication: None at bedside   Procedure: CT head, 2D echo Disposition: Home   Discharge Instructions   Allergies as of 08/27/2017      Reactions   Sulfa Antibiotics Hives      Medication List     STOP taking these medications   amLODipine 5 MG tablet Commonly known as:  NORVASC     TAKE these medications   apixaban 5 MG Tabs tablet Commonly known as:  ELIQUIS Take 1 tablet (5 mg total) by mouth 2 (two) times daily.   aspirin EC 81 MG tablet Take 324 mg by mouth once.   buPROPion 300 MG 24 hr tablet Commonly known as:  WELLBUTRIN XL   ferrous sulfate 325 (65 FE) MG tablet Take 325 mg by mouth daily with breakfast.   Fluticasone-Umeclidin-Vilant 100-62.5-25 MCG/INH Aepb Commonly known as:  TRELEGY ELLIPTA Inhale 1 puff into the lungs daily.   ibuprofen 400 MG tablet Commonly known as:  MOTRIN IB Take 0.5 tablets (200 mg total) by mouth every 8 (eight) hours as needed for up to 5 days.   levothyroxine 25 MCG tablet Commonly known as:  SYNTHROID, LEVOTHROID Take 25 mcg by mouth daily before breakfast.   metoprolol succinate 25 MG 24 hr tablet Commonly known as:  TOPROL-XL Take 1 tablet (25 mg total) by mouth daily. Start taking on:  08/28/2017   nitroGLYCERIN 0.4 MG SL tablet Commonly known as:  NITROSTAT Place 1 tablet (0.4 mg total) under the tongue every 5 (five) minutes as needed. What changed:  how much to take   rosuvastatin 20 MG tablet Commonly known as:  Pelion, John Z, MD Follow up in 2 week(s).   Specialty:  Internal Medicine Contact information: [facility] [address] [phone]        Arnoldo Lenis, MD. Schedule an appointment as soon as possible for a visit in 1 week(s).   Specialty:  Cardiology Contact information: [place] [address] [phone]          Allergies  Allergen Reactions  . Sulfa Antibiotics Hives      Procedures/Studies: Dg Chest 2 View  Result Date: 08/26/2017 CLINICAL DATA:  Bilateral arm pain EXAM: CHEST - 2 VIEW COMPARISON:  05/19/2017 FINDINGS: The lungs are hyperinflated likely secondary to COPD. There is no focal  parenchymal opacity. There is no pleural effusion or pneumothorax. The heart and mediastinal contours are unremarkable. The osseous structures are unremarkable. IMPRESSION: No active cardiopulmonary disease. Electronically Signed   By: Kathreen Devoid   On: 08/26/2017 11:48   Ct Head Wo Contrast  Result Date: 08/26/2017 CLINICAL DATA:  Syncope, bilateral upper extremity pain. EXAM: CT HEAD WITHOUT CONTRAST TECHNIQUE: Contiguous axial images were obtained from the base of the skull through the vertex without intravenous contrast. COMPARISON:  None. FINDINGS: Brain: No evidence of acute infarction, hemorrhage, hydrocephalus, extra-axial collection or mass lesion/mass effect. Vascular: No hyperdense vessel or unexpected calcification. Skull: Normal. Negative for fracture or focal lesion. Sinuses/Orbits: No acute finding. Other: None. IMPRESSION: Normal head CT. Electronically Signed   By: Marijo Conception, M.D.   On: 08/26/2017 12:04    2D echo Study Conclusions  - Left ventricle: The cavity size was normal. Wall thickness was at   the upper limits of normal. Systolic function was normal. The   estimated ejection fraction was in the range of  60% to 65%. Wall   motion was normal; there were no regional wall motion   abnormalities. Doppler parameters are consistent with abnormal   left ventricular relaxation (grade 1 diastolic dysfunction). - Aortic valve: Mildly calcified annulus. Trileaflet. - Mitral valve: Mildly calcified annulus. There was mild   regurgitation. - Right atrium: Central venous pressure (est): 3 mm Hg. - Atrial septum: No defect or patent foramen ovale was identified. - Tricuspid valve: There was trivial regurgitation. - Pulmonary arteries: PA peak pressure: 29 mm Hg (S). - Pericardium, extracardiac: There was no pericardial effusion.  Subjective: No further chest pain or dizziness.  Discharge Exam: Vitals:   08/27/17 1008 08/27/17 1031  BP:    Pulse:    Resp:    Temp:     SpO2: 97% 97%   Vitals:   08/26/17 2154 08/27/17 0450 08/27/17 1008 08/27/17 1031  BP: 133/78 123/67    Pulse: 70 78    Resp:      Temp: 98 F (36.7 C) 97.8 F (36.6 C)    TempSrc: Oral Oral    SpO2: 95% 96% 97% 97%  Weight:      Height:        General: Elderly female not in distress HEENT: Moist mucosa, supple neck Chest: Clear to auscultation bilaterally CVS: S1-S2 regular, no murmurs GI: Soft, nondistended, nontender Musculoskeletal: Warm, no edema    The results of significant diagnostics from this hospitalization (including imaging, microbiology, ancillary and laboratory) are listed below for reference.     Microbiology: No results found for this or any previous visit (from the past 240 hour(s)).   Labs: BNP (last 3 results) Recent Labs    09/08/16 1248  BNP 008.6*   Basic Metabolic Panel: Recent Labs  Lab 08/26/17 1100 08/27/17 0518  NA 140 141  K 4.4 4.7  CL 108 110  CO2 24 25  GLUCOSE 104* 95  BUN 23* 30*  CREATININE 1.28* 1.20*  CALCIUM 9.0 9.1   Liver Function Tests: No results for input(s): AST, ALT, ALKPHOS, BILITOT, PROT, ALBUMIN in the last 168 hours. No results for input(s): LIPASE, AMYLASE in the last 168 hours. No results for input(s): AMMONIA in the last 168 hours. CBC: Recent Labs  Lab 08/26/17 1100 08/27/17 0518  WBC 7.5 6.8  NEUTROABS 5.1  --   HGB 12.2 11.6*  HCT 38.3 36.8  MCV 93.9 93.4  PLT 143* 174   Cardiac Enzymes: Recent Labs  Lab 08/26/17 1100 08/26/17 1719 08/26/17 2253 08/27/17 0518  TROPONINI <0.03 <0.03 <0.03 <0.03   BNP: Invalid input(s): POCBNP CBG: No results for input(s): GLUCAP in the last 168 hours. D-Dimer No results for input(s): DDIMER in the last 72 hours. Hgb A1c No results for input(s): HGBA1C in the last 72 hours. Lipid Profile No results for input(s): CHOL, HDL, LDLCALC, TRIG, CHOLHDL, LDLDIRECT in the last 72 hours. Thyroid function studies Recent Labs    08/26/17 1100  TSH  5.618*   Anemia work up No results for input(s): VITAMINB12, FOLATE, FERRITIN, TIBC, IRON, RETICCTPCT in the last 72 hours. Urinalysis    Component Value Date/Time   COLORURINE YELLOW 08/26/2017 1022   APPEARANCEUR HAZY (A) 08/26/2017 1022   LABSPEC 1.020 08/26/2017 1022   PHURINE 6.0 08/26/2017 1022   GLUCOSEU NEGATIVE 08/26/2017 [place] 08/26/2017 [place] 08/26/2017 [place] 08/26/2017 1022   PROTEINUR NEGATIVE 08/26/2017 1022   NITRITE NEGATIVE 08/26/2017 1022   LEUKOCYTESUR SMALL (  A) 08/26/2017 1022   Sepsis Labs Invalid input(s): PROCALCITONIN,  WBC,  LACTICIDVEN Microbiology No results found for this or any previous visit (from the past 240 hour(s)).   Time coordinating discharge: < 30 minutes  SIGNED:   Louellen Molder, MD  [facility] 08/27/2017, 2:15 PM Pager   If 7PM-7AM, please contact night-coverage [email or web address] Password [email or web address]

## 2017-08-27 NOTE — Progress Notes (Signed)
Discharge instructions given to patient, expresses understanding, all questions answered.  Patient d/c home in private vehicle.

## 2017-09-02 DIAGNOSIS — J449 Chronic obstructive pulmonary disease, unspecified: Secondary | ICD-10-CM | POA: Diagnosis not present

## 2017-09-13 DIAGNOSIS — E039 Hypothyroidism, unspecified: Secondary | ICD-10-CM | POA: Diagnosis not present

## 2017-09-29 DIAGNOSIS — L218 Other seborrheic dermatitis: Secondary | ICD-10-CM | POA: Diagnosis not present

## 2017-10-02 DIAGNOSIS — J449 Chronic obstructive pulmonary disease, unspecified: Secondary | ICD-10-CM | POA: Diagnosis not present

## 2017-10-05 ENCOUNTER — Other Ambulatory Visit: Payer: Self-pay | Admitting: Cardiology

## 2017-10-18 DIAGNOSIS — E782 Mixed hyperlipidemia: Secondary | ICD-10-CM | POA: Diagnosis not present

## 2017-10-18 DIAGNOSIS — E039 Hypothyroidism, unspecified: Secondary | ICD-10-CM | POA: Diagnosis not present

## 2017-10-19 ENCOUNTER — Other Ambulatory Visit: Payer: Self-pay | Admitting: Internal Medicine

## 2017-10-19 DIAGNOSIS — Z780 Asymptomatic menopausal state: Secondary | ICD-10-CM

## 2017-11-02 DIAGNOSIS — J449 Chronic obstructive pulmonary disease, unspecified: Secondary | ICD-10-CM | POA: Diagnosis not present

## 2017-11-11 DIAGNOSIS — M48061 Spinal stenosis, lumbar region without neurogenic claudication: Secondary | ICD-10-CM | POA: Diagnosis not present

## 2017-11-17 ENCOUNTER — Ambulatory Visit (HOSPITAL_COMMUNITY)
Admission: RE | Admit: 2017-11-17 | Discharge: 2017-11-17 | Disposition: A | Discharge status: Home / Self Care | Payer: Medicare Other | Source: Ambulatory Visit | Attending: Internal Medicine | Admitting: Internal Medicine

## 2017-11-17 DIAGNOSIS — M810 Age-related osteoporosis without current pathological fracture: Secondary | ICD-10-CM | POA: Diagnosis not present

## 2017-11-17 DIAGNOSIS — Z780 Asymptomatic menopausal state: Secondary | ICD-10-CM | POA: Insufficient documentation

## 2017-11-17 DIAGNOSIS — M8588 Other specified disorders of bone density and structure, other site: Secondary | ICD-10-CM | POA: Diagnosis not present

## 2017-11-30 DIAGNOSIS — Z6823 Body mass index (BMI) 23.0-23.9, adult: Secondary | ICD-10-CM | POA: Diagnosis not present

## 2017-11-30 DIAGNOSIS — M810 Age-related osteoporosis without current pathological fracture: Secondary | ICD-10-CM | POA: Diagnosis not present

## 2017-12-03 DIAGNOSIS — J449 Chronic obstructive pulmonary disease, unspecified: Secondary | ICD-10-CM | POA: Diagnosis not present

## 2017-12-09 DIAGNOSIS — M48061 Spinal stenosis, lumbar region without neurogenic claudication: Secondary | ICD-10-CM | POA: Diagnosis not present

## 2017-12-13 ENCOUNTER — Encounter (HOSPITAL_COMMUNITY)
Admission: RE | Admit: 2017-12-13 | Discharge: 2017-12-13 | Disposition: A | Discharge status: Home / Self Care | Payer: Medicare Other | Source: Ambulatory Visit | Attending: Internal Medicine | Admitting: Internal Medicine

## 2017-12-13 DIAGNOSIS — M810 Age-related osteoporosis without current pathological fracture: Secondary | ICD-10-CM | POA: Insufficient documentation

## 2017-12-13 MED ORDER — DENOSUMAB 60 MG/ML ~~LOC~~ SOSY
60.0000 mg | PREFILLED_SYRINGE | Freq: Once | SUBCUTANEOUS | Status: AC
  Administered 2017-12-13: 60 mg via SUBCUTANEOUS
  Filled 2017-12-13: qty 1

## 2017-12-13 NOTE — Discharge Instructions (Signed)
Denosumab injection °What is this medicine? °DENOSUMAB (den oh sue mab) slows bone breakdown. Prolia is used to treat osteoporosis in women after menopause and in men. Xgeva is used to treat a high calcium level due to cancer and to prevent bone fractures and other bone problems caused by multiple myeloma or cancer bone metastases. Xgeva is also used to treat giant cell tumor of the bone. °This medicine may be used for other purposes; ask your health care provider or pharmacist if you have questions. °COMMON BRAND NAME(S): Prolia, XGEVA °What should I tell my health care provider before I take this medicine? °They need to know if you have any of these conditions: °-dental disease °-having surgery or tooth extraction °-infection °-kidney disease °-low levels of calcium or Vitamin D in the blood °-malnutrition °-on hemodialysis °-skin conditions or sensitivity °-thyroid or parathyroid disease °-an unusual reaction to denosumab, other medicines, foods, dyes, or preservatives °-pregnant or trying to get pregnant °-breast-feeding °How should I use this medicine? °This medicine is for injection under the skin. It is given by a health care professional in a hospital or clinic setting. °If you are getting Prolia, a special MedGuide will be given to you by the pharmacist with each prescription and refill. Be sure to read this information carefully each time. °For Prolia, talk to your pediatrician regarding the use of this medicine in children. Special care may be needed. For Xgeva, talk to your pediatrician regarding the use of this medicine in children. While this drug may be prescribed for children as young as 13 years for selected conditions, precautions do apply. °Overdosage: If you think you have taken too much of this medicine contact a poison control center or emergency room at once. °NOTE: This medicine is only for you. Do not share this medicine with others. °What if I miss a dose? °It is important not to miss your  dose. Call your doctor or health care professional if you are unable to keep an appointment. °What may interact with this medicine? °Do not take this medicine with any of the following medications: °-other medicines containing denosumab °This medicine may also interact with the following medications: °-medicines that lower your chance of fighting infection °-steroid medicines like prednisone or cortisone °This list may not describe all possible interactions. Give your health care provider a list of all the medicines, herbs, non-prescription drugs, or dietary supplements you use. Also tell them if you smoke, drink alcohol, or use illegal drugs. Some items may interact with your medicine. °What should I watch for while using this medicine? °Visit your doctor or health care professional for regular checks on your progress. Your doctor or health care professional may order blood tests and other tests to see how you are doing. °Call your doctor or health care professional for advice if you get a fever, chills or sore throat, or other symptoms of a cold or flu. Do not treat yourself. This drug may decrease your body's ability to fight infection. Try to avoid being around people who are sick. °You should make sure you get enough calcium and vitamin D while you are taking this medicine, unless your doctor tells you not to. Discuss the foods you eat and the vitamins you take with your health care professional. °See your dentist regularly. Brush and floss your teeth as directed. Before you have any dental work done, tell your dentist you are receiving this medicine. °Do not become pregnant while taking this medicine or for 5 months after stopping   it. Talk with your doctor or health care professional about your birth control options while taking this medicine. Women should inform their doctor if they wish to become pregnant or think they might be pregnant. There is a potential for serious side effects to an unborn child. Talk  to your health care professional or pharmacist for more information. What side effects may I notice from receiving this medicine? Side effects that you should report to your doctor or health care professional as soon as possible: -allergic reactions like skin rash, itching or hives, swelling of the face, lips, or tongue -bone pain -breathing problems -dizziness -jaw pain, especially after dental work -redness, blistering, peeling of the skin -signs and symptoms of infection like fever or chills; cough; sore throat; pain or trouble passing urine -signs of low calcium like fast heartbeat, muscle cramps or muscle pain; pain, tingling, numbness in the hands or feet; seizures -unusual bleeding or bruising -unusually weak or tired Side effects that usually do not require medical attention (report to your doctor or health care professional if they continue or are bothersome): -constipation -diarrhea -headache -joint pain -loss of appetite -muscle pain -runny nose -tiredness -upset stomach This list may not describe all possible side effects. Call your doctor for medical advice about side effects. You may report side effects to FDA at 1-800-FDA-1088. Where should I keep my medicine? This medicine is only given in a clinic, doctor's office, or other health care setting and will not be stored at home. NOTE: This sheet is a summary. It may not cover all possible information. If you have questions about this medicine, talk to your doctor, pharmacist, or health care provider.  2018 Elsevier/Gold Standard (2016-04-13 19:17:21)

## 2017-12-14 ENCOUNTER — Telehealth: Payer: Self-pay | Admitting: Pulmonary Disease

## 2017-12-14 NOTE — Telephone Encounter (Signed)
Called and spoke with patient, she stated that she was needing to schedule an appointment with Dr. Vaughan Browner. Patient has been scheduled. Nothing further needed.

## 2017-12-15 ENCOUNTER — Other Ambulatory Visit: Payer: Self-pay | Admitting: Acute Care

## 2017-12-15 DIAGNOSIS — Z122 Encounter for screening for malignant neoplasm of respiratory organs: Secondary | ICD-10-CM

## 2017-12-15 DIAGNOSIS — Z87891 Personal history of nicotine dependence: Secondary | ICD-10-CM

## 2017-12-17 DIAGNOSIS — M545 Low back pain: Secondary | ICD-10-CM | POA: Diagnosis not present

## 2017-12-22 ENCOUNTER — Telehealth: Payer: Self-pay | Admitting: Pulmonary Disease

## 2017-12-22 NOTE — Telephone Encounter (Signed)
Received surgical clearance request from [name].  Per Dr. Vaughan Browner - scheduled OV with NP or himself.  Pt has been scheduled for OV with Dr. Vaughan Browner on 12/26/17 at 3:45. Nothing further is needed.

## 2017-12-23 ENCOUNTER — Encounter: Payer: Self-pay | Admitting: Pulmonary Disease

## 2017-12-23 ENCOUNTER — Ambulatory Visit (INDEPENDENT_AMBULATORY_CARE_PROVIDER_SITE_OTHER): Payer: Medicare Other | Admitting: Pulmonary Disease

## 2017-12-23 DIAGNOSIS — Z87891 Personal history of nicotine dependence: Secondary | ICD-10-CM

## 2017-12-23 DIAGNOSIS — J449 Chronic obstructive pulmonary disease, unspecified: Secondary | ICD-10-CM | POA: Diagnosis not present

## 2017-12-23 NOTE — Patient Instructions (Addendum)
I am glad you are doing well with your breathing Continue the trelegy inhaler You are cleared to undergo the knee surgery  Follow-up in 6 months with NP.

## 2017-12-23 NOTE — Progress Notes (Signed)
Patricia Jackson    [id number]    July 31, 1940  Primary Care Physician:Hall, Edwinna Areola, MD  Referring Physician: Celene Squibb, MD [address] [place],  [address]  Chief complaint:  Follow-up for  COPD GOLD GOLD D Pre op clearance for knee surgery  HPI: [name] is a 77 year old with past medical history of atrial fibrillation, COPD GOLD (CAT score 24, Multiple exacerbations).   She was hospitalized in August of 2017 with atypical chest pain, COPD exacerbation. She was treated with prednisone taper. She was also noted to be in atrial flutter. She was initially treated with a Cardizem drip and then transitioned to amiodarone. The plan is to use this for short-term of about 8 weeks. She is followed by cardiology.  Had another hospitalization in December 2017 with NSTEMI status post catheterization and stent placement. Also noted to have low hemoglobin of 6.5. Transfused for an deficiency anemia.  Admitted in June 2018 to [facility] for acute dyspnea. She had an evaluation including chest x-ray which showed COPD. VQ scan shows low probability for pulmonary embolism and lower extremity ultrasound was negative for DVT. She improved with the treatment and was discharged. She did not require antibiotics or prednisone. Her inhalers have been changed from Symbicort, Spiriva to breo and incruse.   Interim History: Started on trelegy at last visit.  States that she likes this better than her previous inhalers   Outpatient Encounter Medications as of 12/23/2017  Medication Sig  . aspirin EC 81 MG tablet Take 324 mg by mouth once.  Marland Kitchen buPROPion (WELLBUTRIN XL) 300 MG 24 hr tablet   . ELIQUIS 5 MG TABS tablet TAKE 1 TABLET (5 MG TOTAL) BY MOUTH 2 TIMES DAILY  . ferrous sulfate 325 (65 FE) MG tablet Take 325 mg by mouth daily with breakfast.   . Fluticasone-Umeclidin-Vilant (TRELEGY ELLIPTA) 100-62.5-25 MCG/INH AEPB Inhale 1 puff into the lungs daily.  Marland Kitchen levothyroxine  (SYNTHROID, LEVOTHROID) 25 MCG tablet Take 25 mcg by mouth daily before breakfast.  . metoprolol succinate (TOPROL-XL) 25 MG 24 hr tablet Take 1 tablet (25 mg total) by mouth daily. (Patient taking differently: Take 50 mg by mouth daily. )  . nitroGLYCERIN (NITROSTAT) 0.4 MG SL tablet Place 1 tablet (0.4 mg total) under the tongue every 5 (five) minutes as needed.  . rosuvastatin (CRESTOR) 20 MG tablet    No facility-administered encounter medications on file as of 12/23/2017.    Physical Exam: Blood pressure 116/68, pulse 67, height 5' 7"  (1.702 m), weight 134 lb 3.2 oz (60.9 kg), SpO2 91 %. Gen:      No acute distress HEENT:  EOMI, sclera anicteric Neck:     No masses; no thyromegaly Lungs:    Clear to auscultation bilaterally; normal respiratory effort CV:         Regular rate and rhythm; no murmurs Abd:      + bowel sounds; soft, non-tender; no palpable masses, no distension Ext:    No edema; adequate peripheral perfusion Skin:      Warm and dry; no rash Neuro: alert and oriented x 3 Psych: normal mood and affect  Data Reviewed: Imaging  CT chest 11/26/13. Diffuse emphysematous changes Screening CT of chest 06/23/16-. Emphysematous changes, three-vessel coronary artery disease Chest x-ray 09/08/16-chronic hyperinflation, emphysematous changes VQ scan 09/08/16-low probability High-resolution CT 09/24/2016- No interstitial lung disease, moderate emphysema three-vessel coronary artery disease I have reviewed all images personally.  Lower extremity  ultrasound 09/09/16-no DVT  PFT  11/21/13: FVC 2.54 L (75%) , FEV1 1.68 L (66%) , FEV1/FVC 0.66 , FEF 25-75 0.83 L (40%) no bronchodilator response  TLC 5.65 L (102%) , RV 138% , DLCO uncorrected 40% Moderate obstructive defect with severe DLCO impairment.  05/11/16 FVC 2.42 (74%), FEV1 1.6 (64%), F/F 66, TLC 84%, DLCO 33%  Moderate obstructive defect with severe DLCO impairment and positive bronchodilator response  Cardiac Echocardiogram  08/27/2017-LVEF 69-43%, grade 1 diastolic dysfunction, PA peak pressure 29  Labs CBC 08/26/2017-WBC 7.5, eos 3%, absolute eosinophil count 200  Assessment:  Preop for knee surgery COPD is under good control She is at low-intermediate risk for perioperative pulmonary complications given her age and COPD No pulmonary contraindications for surgery Recommend early mobilization, use of incentive spirometer post surgery.  COPD GOLD D Symptoms are stable on trelegy inhaler He needs to use her rescue medication Did not desat on exertion today.  Pulmonary rehab discussed but patient would like to defer for now  She's had prior CT scans which reports pulmonary fibrosis although high-resolution CT last year did not show any interstitial lung disease  Past Smoker She continues to be tobacco free. Continue low-dose screening CTs of the chest.  Plan/Recommendations: - Continue trelegy inhaler. - Cleared for surgery.  Marshell Garfinkel MD [facility] 12/23/2017, 11:54 AM  CC: Celene Squibb, MD

## 2017-12-26 ENCOUNTER — Ambulatory Visit: Payer: Medicare Other | Admitting: Pulmonary Disease

## 2018-01-02 DIAGNOSIS — J449 Chronic obstructive pulmonary disease, unspecified: Secondary | ICD-10-CM | POA: Diagnosis not present

## 2018-01-14 ENCOUNTER — Encounter (HOSPITAL_COMMUNITY): Payer: Self-pay | Admitting: Emergency Medicine

## 2018-01-14 ENCOUNTER — Other Ambulatory Visit: Payer: Self-pay

## 2018-01-14 ENCOUNTER — Emergency Department (HOSPITAL_COMMUNITY)
Admission: EM | Admit: 2018-01-14 | Discharge: 2018-01-14 | Disposition: A | Discharge status: Home / Self Care | Payer: Medicare Other | Attending: Emergency Medicine | Admitting: Emergency Medicine

## 2018-01-14 DIAGNOSIS — I2510 Atherosclerotic heart disease of native coronary artery without angina pectoris: Secondary | ICD-10-CM | POA: Diagnosis not present

## 2018-01-14 DIAGNOSIS — Z79899 Other long term (current) drug therapy: Secondary | ICD-10-CM | POA: Diagnosis not present

## 2018-01-14 DIAGNOSIS — I129 Hypertensive chronic kidney disease with stage 1 through stage 4 chronic kidney disease, or unspecified chronic kidney disease: Secondary | ICD-10-CM | POA: Insufficient documentation

## 2018-01-14 DIAGNOSIS — Z87891 Personal history of nicotine dependence: Secondary | ICD-10-CM | POA: Insufficient documentation

## 2018-01-14 DIAGNOSIS — N184 Chronic kidney disease, stage 4 (severe): Secondary | ICD-10-CM | POA: Insufficient documentation

## 2018-01-14 DIAGNOSIS — K047 Periapical abscess without sinus: Secondary | ICD-10-CM | POA: Diagnosis not present

## 2018-01-14 DIAGNOSIS — J449 Chronic obstructive pulmonary disease, unspecified: Secondary | ICD-10-CM | POA: Diagnosis not present

## 2018-01-14 DIAGNOSIS — K0889 Other specified disorders of teeth and supporting structures: Secondary | ICD-10-CM | POA: Diagnosis present

## 2018-01-14 MED ORDER — AMOXICILLIN 500 MG PO CAPS
500.0000 mg | ORAL_CAPSULE | Freq: Three times a day (TID) | ORAL | 0 refills | Status: DC
Start: 2018-01-14 — End: 2019-05-02

## 2018-01-14 MED ORDER — HYDROCODONE-ACETAMINOPHEN 5-325 MG PO TABS: 1.0000 | ORAL_TABLET | ORAL | 0 refills | Status: DC | PRN

## 2018-01-14 NOTE — Discharge Instructions (Signed)
Return if any problems.

## 2018-01-14 NOTE — ED Triage Notes (Signed)
Pt states has dental caries to right lower. Swelling noted. Pt does not have dentist

## 2018-01-14 NOTE — ED Provider Notes (Signed)
[facility] Provider Note   CSN: [id number] Arrival date & time: 01/14/18  1058     History   Chief Complaint Chief Complaint  Patient presents with  . Dental Pain    HPI Patricia Jackson is a 77 y.o. female.  The history is provided by the patient. No language interpreter was used.  Dental Pain   This is a new problem. The current episode started 2 days ago. The problem occurs constantly. The problem has been gradually worsening. The pain is moderate. She has tried nothing for the symptoms. The treatment provided no relief.  Pt reports she has been told she needs dental work.  Pt reports the right side of her face is swollen   Past Medical History:  Diagnosis Date  . Atrial flutter ([place])    On Eliquis in 8/17 but discontinued after stent placement  . Breast cancer ([place])    remote  . CAD in native artery 03/16/2016  . COPD (chronic obstructive pulmonary disease) ([place])   . Hypertension   . S/P angioplasty with stent 03/15/16 DES Resolute, pLCX 03/16/2016    Patient Active Problem List   Diagnosis Date Noted  . Orthostatic syncope 08/27/2017  . Atypical chest pain 08/27/2017  . Atrial flutter ([place]) 10/28/2016  . CKD (chronic kidney disease), stage IV ([place]) 10/28/2016  . COPD (chronic obstructive pulmonary disease) ([place]) 10/28/2016  . Depression 10/28/2016  . Esophageal dysphagia   . Anemia 04/07/2016  . Sinus bradycardia   . CAD in native artery 03/16/2016  . S/P angioplasty with stent 03/15/16 DES Resolute, [place] 03/16/2016  . NSTEMI (non-ST elevated myocardial infarction) ([place]) 03/12/2016  . Shortness of breath   . 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[place]) 10/31/2015  . Dyslipidemia 10/31/2015  . Atrial flutter with rapid ventricular response ([place]) 10/30/2015  . Back pain 11/26/2013  . Arm pain 11/26/2013  . Chest pain 11/26/2013  . Hypertension     Past Surgical History:  Procedure Laterality Date  . APPENDECTOMY    . BREAST SURGERY Left   . CARDIAC  CATHETERIZATION N/A 03/15/2016   Procedure: Left Heart Cath and Coronary Angiography;  Surgeon: Belva Crome, MD;  Location: [facility];  Service: Cardiovascular;  Laterality: N/A;  . CARDIAC CATHETERIZATION N/A 03/15/2016   Procedure: Coronary Stent Intervention;  Surgeon: Belva Crome, MD;  Location: [facility];  Service: Cardiovascular;  Laterality: N/A;  . COLONOSCOPY     remote  . COLONOSCOPY N/A 05/26/2016   Procedure: COLONOSCOPY;  Surgeon: Daneil Dolin, MD;  [facility];  Service: Endoscopy;  Laterality: N/A;  845  . ESOPHAGOGASTRODUODENOSCOPY N/A 05/26/2016   Procedure: ESOPHAGOGASTRODUODENOSCOPY (EGD);  Surgeon: Daneil Dolin, MD;  [facility];  Service: Endoscopy;  Laterality: N/A;  Venia Minks DILATION N/A 05/26/2016   Procedure: Venia Minks DILATION;  Surgeon: Daneil Dolin, MD;  [facility];  Service: Endoscopy;  Laterality: N/A;     OB History    Gravida      Para      Term      Preterm      AB      Living  1     SAB      TAB      Ectopic      Multiple      Live Births               Home Medications    Prior to Admission medications  Medication Sig Start Date End Date Taking? Authorizing Provider  acetaminophen (TYLENOL) 500 MG tablet Take 1,500 mg by mouth every 4 (four) hours as needed.   Yes [provider]  albuterol (PROVENTIL HFA;VENTOLIN HFA) 108 (90 Base) MCG/ACT inhaler Inhale 2 puffs into the lungs every 4 (four) hours as needed.   Yes [provider]  buPROPion (WELLBUTRIN XL) 300 MG 24 hr tablet Take 300 mg by mouth daily.  01/21/17  Yes [provider]  ELIQUIS 5 MG TABS tablet TAKE 1 TABLET (5 MG TOTAL) BY MOUTH 2 TIMES DAILY 10/05/17  Yes Branch, Alphonse Guild, MD  ferrous sulfate 325 (65 FE) MG tablet Take 325 mg by mouth daily with breakfast.  04/08/16  Yes [provider]  Fluticasone-Umeclidin-Vilant (TRELEGY ELLIPTA) 100-62.5-25 MCG/INH AEPB Inhale 1  puff into the lungs daily. 07/29/17  Yes Mannam, Praveen, MD  metoprolol succinate (TOPROL-XL) 25 MG 24 hr tablet Take 1 tablet (25 mg total) by mouth daily. Patient taking differently: Take 50 mg by mouth daily.  08/28/17  Yes Dhungel, Nishant, MD  nitroGLYCERIN (NITROSTAT) 0.4 MG SL tablet Place 1 tablet (0.4 mg total) under the tongue every 5 (five) minutes as needed. 08/27/17  Yes Dhungel, Nishant, MD  rosuvastatin (CRESTOR) 20 MG tablet Take 20 mg by mouth daily.  02/26/17  Yes [provider]  amoxicillin (AMOXIL) 500 MG capsule Take 1 capsule (500 mg total) by mouth 3 (three) times daily. 01/14/18   Fransico Meadow, PA-C  HYDROcodone-acetaminophen (NORCO/VICODIN) 5-325 MG tablet Take 1 tablet by mouth every 4 (four) hours as needed. 01/14/18   Fransico Meadow, PA-C    Family History Family History  Problem Relation Age of Onset  . Heart disease Mother   . COPD Father   . Cancer Sister        unknown primary  . Cancer Brother        unknown primary  . Colon cancer Neg Hx     Social History Social History   Tobacco Use  . Smoking status: Former Smoker    Packs/day: 1.00    Years: 56.00    Pack years: 56.00    Types: Cigarettes    Start date: 12/15/1965    Last attempt to quit: 10/29/2015    Years since quitting: 2.2  . Smokeless tobacco: Never Used  Substance Use Topics  . Alcohol use: No    Alcohol/week: 0.0 standard drinks  . Drug use: No     Allergies   Sulfa antibiotics   Review of Systems Review of Systems  All other systems reviewed and are negative.    Physical Exam Updated Vital Signs BP 134/85 (BP Location: Right Arm)   Pulse 66   Temp (!) 97.5 F (36.4 C) (Oral)   Resp 18   Ht 5' 8"  (1.727 m)   Wt 64.4 kg   SpO2 99%   BMI 21.59 kg/m   Physical Exam  Constitutional: She is oriented to person, place, and time. She appears well-developed and well-nourished.  HENT:  Head: Normocephalic and atraumatic.  Swelling right face,   Eyes:  Pupils are equal, round, and reactive to light.  Neck: Normal range of motion.  Cardiovascular: Normal rate.  Pulmonary/Chest: Effort normal.  Neurological: She is alert and oriented to person, place, and time.  Skin: Skin is warm.  Psychiatric: She has a normal mood and affect.  Nursing note and vitals reviewed.    ED Treatments / Results  Labs (all labs ordered  are listed, but only abnormal results are displayed) Labs Reviewed - No data to display  EKG None  Radiology No results found.  Procedures Procedures (including critical care time)  Medications Ordered in ED Medications - No data to display   Initial Impression / Assessment and Plan / ED Course  I have reviewed the triage vital signs and the nursing notes.  Pertinent labs & imaging results that were available during my care of the patient were reviewed by me and considered in my medical decision making (see chart for details).       Final Clinical Impressions(s) / ED Diagnoses   Final diagnoses:  Dental infection    ED Discharge Orders         Ordered    amoxicillin (AMOXIL) 500 MG capsule  3 times daily     01/14/18 1203    HYDROcodone-acetaminophen (NORCO/VICODIN) 5-325 MG tablet  Every 4 hours PRN     01/14/18 1203        An After Visit Summary was printed and given to the patient.    Fransico Meadow, PA-C 01/14/18 1632    Nat Christen, MD 01/15/18 [id number]

## 2018-01-14 NOTE — ED Notes (Signed)
Pt reports bad teeth Now with swelling and pain to R jaw area  She has no dentist

## 2018-01-14 NOTE — ED Notes (Signed)
Call to pharm tech to review meds

## 2018-01-18 DIAGNOSIS — R7301 Impaired fasting glucose: Secondary | ICD-10-CM | POA: Diagnosis not present

## 2018-01-18 DIAGNOSIS — E782 Mixed hyperlipidemia: Secondary | ICD-10-CM | POA: Diagnosis not present

## 2018-01-18 DIAGNOSIS — R944 Abnormal results of kidney function studies: Secondary | ICD-10-CM | POA: Diagnosis not present

## 2018-01-18 DIAGNOSIS — E785 Hyperlipidemia, unspecified: Secondary | ICD-10-CM | POA: Diagnosis not present

## 2018-01-23 DIAGNOSIS — E782 Mixed hyperlipidemia: Secondary | ICD-10-CM | POA: Diagnosis not present

## 2018-01-23 DIAGNOSIS — E039 Hypothyroidism, unspecified: Secondary | ICD-10-CM | POA: Diagnosis not present

## 2018-01-23 DIAGNOSIS — R7301 Impaired fasting glucose: Secondary | ICD-10-CM | POA: Diagnosis not present

## 2018-01-30 ENCOUNTER — Ambulatory Visit: Payer: Medicare Other | Admitting: Pulmonary Disease

## 2018-01-31 ENCOUNTER — Ambulatory Visit: Payer: Medicare Other | Admitting: Pulmonary Disease

## 2018-02-02 DIAGNOSIS — J449 Chronic obstructive pulmonary disease, unspecified: Secondary | ICD-10-CM | POA: Diagnosis not present

## 2018-02-08 ENCOUNTER — Other Ambulatory Visit: Payer: Self-pay | Admitting: Cardiology

## 2018-03-04 DIAGNOSIS — J449 Chronic obstructive pulmonary disease, unspecified: Secondary | ICD-10-CM | POA: Diagnosis not present

## 2018-04-04 DIAGNOSIS — J449 Chronic obstructive pulmonary disease, unspecified: Secondary | ICD-10-CM | POA: Diagnosis not present

## 2018-04-27 ENCOUNTER — Other Ambulatory Visit: Payer: Self-pay | Admitting: Cardiology

## 2018-05-05 DIAGNOSIS — J449 Chronic obstructive pulmonary disease, unspecified: Secondary | ICD-10-CM | POA: Diagnosis not present

## 2018-05-06 ENCOUNTER — Emergency Department (HOSPITAL_COMMUNITY)
Admission: EM | Admit: 2018-05-06 | Discharge: 2018-05-06 | Disposition: A | Discharge status: Home / Self Care | Payer: Medicare Other | Attending: Emergency Medicine | Admitting: Emergency Medicine

## 2018-05-06 ENCOUNTER — Other Ambulatory Visit: Payer: Self-pay

## 2018-05-06 ENCOUNTER — Encounter (HOSPITAL_COMMUNITY): Payer: Self-pay | Admitting: Emergency Medicine

## 2018-05-06 DIAGNOSIS — N184 Chronic kidney disease, stage 4 (severe): Secondary | ICD-10-CM | POA: Diagnosis not present

## 2018-05-06 DIAGNOSIS — Z853 Personal history of malignant neoplasm of breast: Secondary | ICD-10-CM | POA: Diagnosis not present

## 2018-05-06 DIAGNOSIS — J449 Chronic obstructive pulmonary disease, unspecified: Secondary | ICD-10-CM | POA: Insufficient documentation

## 2018-05-06 DIAGNOSIS — I129 Hypertensive chronic kidney disease with stage 1 through stage 4 chronic kidney disease, or unspecified chronic kidney disease: Secondary | ICD-10-CM | POA: Diagnosis not present

## 2018-05-06 DIAGNOSIS — H5789 Other specified disorders of eye and adnexa: Secondary | ICD-10-CM | POA: Diagnosis present

## 2018-05-06 DIAGNOSIS — H1032 Unspecified acute conjunctivitis, left eye: Secondary | ICD-10-CM

## 2018-05-06 DIAGNOSIS — Z79899 Other long term (current) drug therapy: Secondary | ICD-10-CM | POA: Diagnosis not present

## 2018-05-06 DIAGNOSIS — Z7901 Long term (current) use of anticoagulants: Secondary | ICD-10-CM | POA: Insufficient documentation

## 2018-05-06 DIAGNOSIS — I2510 Atherosclerotic heart disease of native coronary artery without angina pectoris: Secondary | ICD-10-CM | POA: Insufficient documentation

## 2018-05-06 DIAGNOSIS — I252 Old myocardial infarction: Secondary | ICD-10-CM | POA: Insufficient documentation

## 2018-05-06 DIAGNOSIS — Z87891 Personal history of nicotine dependence: Secondary | ICD-10-CM | POA: Diagnosis not present

## 2018-05-06 MED ORDER — TOBRAMYCIN 0.3 % OP SOLN: 1.0000 [drp] | OPHTHALMIC | 0 refills | Status: AC

## 2018-05-06 NOTE — ED Notes (Signed)
To desk asking how much longer

## 2018-05-06 NOTE — ED Notes (Signed)
Eye drainage and itching for several weeks  Has not sought treatment  Vision Acuity:  OS 20/70 OD 20/20 OU 20/20

## 2018-05-06 NOTE — ED Triage Notes (Signed)
Pt c/o LT eye itching, redness, and discharge (mostly clear) x 2 weeks.

## 2018-05-07 NOTE — ED Provider Notes (Signed)
[facility] EMERGENCY DEPARTMENT Provider Note   CSN: [id number] Arrival date & time: 05/06/18  1213     History   Chief Complaint Chief Complaint  Patient presents with  . Eye Drainage    HPI Patricia Jackson is a 78 y.o. female.  The history is provided by the patient. No language interpreter was used.  Conjunctivitis  This is a new problem. The problem occurs constantly. The problem has been gradually worsening. Nothing aggravates the symptoms. Nothing relieves the symptoms. She has tried nothing for the symptoms. The treatment provided no relief.  Pt complains of redness and swelling eyelids around left eye.  Pt reports drainage in the am.   Past Medical History:  Diagnosis Date  . Atrial flutter ([place])    On Eliquis in 8/17 but discontinued after stent placement  . Breast cancer ([place])    remote  . CAD in native artery 03/16/2016  . COPD (chronic obstructive pulmonary disease) ([place])   . Hypertension   . S/P angioplasty with stent 03/15/16 DES Resolute, pLCX 03/16/2016    Patient Active Problem List   Diagnosis Date Noted  . Orthostatic syncope 08/27/2017  . Atypical chest pain 08/27/2017  . Atrial flutter ([place]) 10/28/2016  . CKD (chronic kidney disease), stage IV ([place]) 10/28/2016  . COPD (chronic obstructive pulmonary disease) ([place]) 10/28/2016  . Depression 10/28/2016  . Esophageal dysphagia   . Anemia 04/07/2016  . Sinus bradycardia   . CAD in native artery 03/16/2016  . S/P angioplasty with stent 03/15/16 DES Resolute, [place] 03/16/2016  . NSTEMI (non-ST elevated myocardial infarction) ([place]) 03/12/2016  . Shortness of breath   . 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[place]) 10/31/2015  . Dyslipidemia 10/31/2015  . Atrial flutter with rapid ventricular response ([place]) 10/30/2015  . Back pain 11/26/2013  . Arm pain 11/26/2013  . Chest pain 11/26/2013  . Hypertension     Past Surgical History:  Procedure Laterality Date  . APPENDECTOMY    . BREAST SURGERY Left   . CARDIAC  CATHETERIZATION N/A 03/15/2016   Procedure: Left Heart Cath and Coronary Angiography;  Surgeon: Belva Crome, MD;  Location: [facility];  Service: Cardiovascular;  Laterality: N/A;  . CARDIAC CATHETERIZATION N/A 03/15/2016   Procedure: Coronary Stent Intervention;  Surgeon: Belva Crome, MD;  Location: [facility];  Service: Cardiovascular;  Laterality: N/A;  . COLONOSCOPY     remote  . COLONOSCOPY N/A 05/26/2016   Procedure: COLONOSCOPY;  Surgeon: Daneil Dolin, MD;  [facility];  Service: Endoscopy;  Laterality: N/A;  845  . ESOPHAGOGASTRODUODENOSCOPY N/A 05/26/2016   Procedure: ESOPHAGOGASTRODUODENOSCOPY (EGD);  Surgeon: Daneil Dolin, MD;  [facility];  Service: Endoscopy;  Laterality: N/A;  Venia Minks DILATION N/A 05/26/2016   Procedure: Venia Minks DILATION;  Surgeon: Daneil Dolin, MD;  [facility];  Service: Endoscopy;  Laterality: N/A;     OB History    Gravida      Para      Term      Preterm      AB      Living  1     SAB      TAB      Ectopic      Multiple      Live Births               Home Medications    Prior to Admission medications   Medication Sig Start Date End Date Taking? Authorizing  Provider  acetaminophen (TYLENOL) 500 MG tablet Take 1,500 mg by mouth every 4 (four) hours as needed.    [provider]  albuterol (PROVENTIL HFA;VENTOLIN HFA) 108 (90 Base) MCG/ACT inhaler Inhale 2 puffs into the lungs every 4 (four) hours as needed.    [provider]  amoxicillin (AMOXIL) 500 MG capsule Take 1 capsule (500 mg total) by mouth 3 (three) times daily. 01/14/18   Fransico Meadow, PA-C  buPROPion (WELLBUTRIN XL) 300 MG 24 hr tablet Take 300 mg by mouth daily.  01/21/17   [provider]  ELIQUIS 5 MG TABS tablet TAKE ONE TABLET (5MG  TOTAL) BY MOUTH TWOTIMES DAILY 02/08/18   Arnoldo Lenis, MD  ferrous sulfate 325 (65 FE) MG tablet Take 325 mg by mouth daily with  breakfast.  04/08/16   [provider]  Fluticasone-Umeclidin-Vilant (TRELEGY ELLIPTA) 100-62.5-25 MCG/INH AEPB Inhale 1 puff into the lungs daily. 07/29/17   Mannam, Hart Robinsons, MD  HYDROcodone-acetaminophen (NORCO/VICODIN) 5-325 MG tablet Take 1 tablet by mouth every 4 (four) hours as needed. 01/14/18   Fransico Meadow, PA-C  metoprolol succinate (TOPROL-XL) 25 MG 24 hr tablet Take 1 tablet (25 mg total) by mouth daily. Patient taking differently: Take 50 mg by mouth daily.  08/28/17   Dhungel, Flonnie Overman, MD  nitroGLYCERIN (NITROSTAT) 0.4 MG SL tablet Place 1 tablet (0.4 mg total) under the tongue every 5 (five) minutes as needed. 08/27/17   Dhungel, Nishant, MD  rosuvastatin (CRESTOR) 20 MG tablet Take 20 mg by mouth daily.  02/26/17   [provider]  tobramycin (TOBREX) 0.3 % ophthalmic solution Place 1 drop into the right eye every 4 (four) hours for 10 days. 05/06/18 05/16/18  Fransico Meadow, PA-C    Family History Family History  Problem Relation Age of Onset  . Heart disease Mother   . COPD Father   . Cancer Sister        unknown primary  . Cancer Brother        unknown primary  . Colon cancer Neg Hx     Social History Social History   Tobacco Use  . Smoking status: Former Smoker    Packs/day: 1.00    Years: 56.00    Pack years: 56.00    Types: Cigarettes    Start date: 12/15/1965    Last attempt to quit: 10/29/2015    Years since quitting: 2.5  . Smokeless tobacco: Never Used  Substance Use Topics  . Alcohol use: No    Alcohol/week: 0.0 standard drinks  . Drug use: No     Allergies   Sulfa antibiotics   Review of Systems Review of Systems  All other systems reviewed and are negative.    Physical Exam Updated Vital Signs BP (!) 131/52 (BP Location: Right Arm)   Pulse 68   Temp (!) 97.5 F (36.4 C) (Oral)   Resp 16   Ht 5\' 7"  (1.702 m)   Wt 64.4 kg   SpO2 100%   BMI 22.24 kg/m   Physical Exam Vitals signs and nursing note reviewed.    Constitutional:      General: She is not in acute distress.    Appearance: She is well-developed.  HENT:     Head: Normocephalic and atraumatic.     Nose: Nose normal.  Eyes:     Comments: Injection of eyelids, slight swelling exudate  Neck:     Musculoskeletal: Neck supple.  Cardiovascular:     Rate and Rhythm:  Normal rate and regular rhythm.     Heart sounds: No murmur.  Pulmonary:     Effort: Pulmonary effort is normal. No respiratory distress.     Breath sounds: Normal breath sounds.  Abdominal:     Palpations: Abdomen is soft.     Tenderness: There is no abdominal tenderness.  Skin:    General: Skin is warm and dry.  Neurological:     General: No focal deficit present.     Mental Status: She is alert.  Psychiatric:        Mood and Affect: Mood normal.      ED Treatments / Results  Labs (all labs ordered are listed, but only abnormal results are displayed) Labs Reviewed - No data to display  EKG None  Radiology No results found.  Procedures Procedures (including critical care time)  Medications Ordered in ED Medications - No data to display   Initial Impression / Assessment and Plan / ED Course  I have reviewed the triage vital signs and the nursing notes.  Pertinent labs & imaging results that were available during my care of the patient were reviewed by me and considered in my medical decision making (see chart for details).       Final Clinical Impressions(s) / ED Diagnoses   Final diagnoses:  Acute conjunctivitis of left eye, unspecified acute conjunctivitis type    ED Discharge Orders         Ordered    tobramycin (TOBREX) 0.3 % ophthalmic solution  Every 4 hours     05/06/18 1358         An After Visit Summary was printed and given to the patient.     Sidney Ace 05/07/18 6270    Noemi Chapel, MD 05/09/18 [phone]

## 2018-05-08 ENCOUNTER — Encounter (HOSPITAL_COMMUNITY)
Admission: RE | Admit: 2018-05-08 | Discharge: 2018-05-08 | Disposition: A | Discharge status: Home / Self Care | Payer: Medicare Other | Source: Ambulatory Visit | Attending: Internal Medicine | Admitting: Internal Medicine

## 2018-05-08 ENCOUNTER — Encounter (HOSPITAL_COMMUNITY): Admission: RE | Admit: 2018-05-08 | Payer: Medicare Other | Source: Ambulatory Visit

## 2018-05-08 DIAGNOSIS — M810 Age-related osteoporosis without current pathological fracture: Secondary | ICD-10-CM | POA: Insufficient documentation

## 2018-05-16 ENCOUNTER — Encounter (HOSPITAL_COMMUNITY)
Admission: RE | Admit: 2018-05-16 | Discharge: 2018-05-16 | Disposition: A | Discharge status: Home / Self Care | Payer: Medicare Other | Source: Ambulatory Visit | Attending: Internal Medicine | Admitting: Internal Medicine

## 2018-05-16 ENCOUNTER — Other Ambulatory Visit (HOSPITAL_COMMUNITY): Payer: Medicare Other

## 2018-05-16 DIAGNOSIS — M810 Age-related osteoporosis without current pathological fracture: Secondary | ICD-10-CM | POA: Diagnosis not present

## 2018-05-16 MED ORDER — DENOSUMAB 60 MG/ML ~~LOC~~ SOSY
60.0000 mg | PREFILLED_SYRINGE | Freq: Once | SUBCUTANEOUS | Status: AC
  Administered 2018-05-16: 60 mg via SUBCUTANEOUS

## 2018-06-03 DIAGNOSIS — J449 Chronic obstructive pulmonary disease, unspecified: Secondary | ICD-10-CM | POA: Diagnosis not present

## 2018-06-10 ENCOUNTER — Other Ambulatory Visit: Payer: Self-pay | Admitting: Cardiology

## 2018-06-26 DIAGNOSIS — K047 Periapical abscess without sinus: Secondary | ICD-10-CM | POA: Diagnosis not present

## 2018-07-13 DIAGNOSIS — E782 Mixed hyperlipidemia: Secondary | ICD-10-CM | POA: Diagnosis not present

## 2018-07-13 DIAGNOSIS — I482 Chronic atrial fibrillation, unspecified: Secondary | ICD-10-CM | POA: Diagnosis not present

## 2018-07-13 DIAGNOSIS — D509 Iron deficiency anemia, unspecified: Secondary | ICD-10-CM | POA: Diagnosis not present

## 2018-07-13 DIAGNOSIS — R7301 Impaired fasting glucose: Secondary | ICD-10-CM | POA: Diagnosis not present

## 2018-07-13 DIAGNOSIS — E785 Hyperlipidemia, unspecified: Secondary | ICD-10-CM | POA: Diagnosis not present

## 2018-07-13 DIAGNOSIS — R944 Abnormal results of kidney function studies: Secondary | ICD-10-CM | POA: Diagnosis not present

## 2018-07-13 DIAGNOSIS — I10 Essential (primary) hypertension: Secondary | ICD-10-CM | POA: Diagnosis not present

## 2018-07-21 DIAGNOSIS — N183 Chronic kidney disease, stage 3 (moderate): Secondary | ICD-10-CM | POA: Diagnosis not present

## 2018-07-21 DIAGNOSIS — R7301 Impaired fasting glucose: Secondary | ICD-10-CM | POA: Diagnosis not present

## 2018-07-21 DIAGNOSIS — E039 Hypothyroidism, unspecified: Secondary | ICD-10-CM | POA: Diagnosis not present

## 2018-07-21 DIAGNOSIS — E782 Mixed hyperlipidemia: Secondary | ICD-10-CM | POA: Diagnosis not present

## 2018-08-08 DIAGNOSIS — Z0000 Encounter for general adult medical examination without abnormal findings: Secondary | ICD-10-CM | POA: Diagnosis not present

## 2018-09-24 IMAGING — RF DG ESOPHAGUS
10 of 12 series · 14 of 24 positions shown · non-contrast
Comparison: None

CLINICAL DATA: Feels like food is sticking in her mid chest for
about 1 month, especially solids with occasionally liquids off and
on, prior esophageal dilatation 2 months ago, esophageal dysphagia,
iron deficiency anemia, history hypertension, COPD, coronary disease
post PTCA and stenting, breast cancer

EXAM:
ESOPHOGRAM / BARIUM SWALLOW / BARIUM TABLET STUDY
TECHNIQUE: Combined double contrast and single contrast examination performed
using effervescent crystals, thick barium liquid, and thin barium
liquid. The patient was observed with fluoroscopy swallowing a 13 mm
barium sulphate tablet.
FLUOROSCOPY TIME:  Fluoroscopy Time:  1 minutes 42 seconds
Radiation Exposure Index (if provided by the fluoroscopic device):
14.9 mGy
Number of Acquired Spot Images: Multiple fluoroscopic screen
captures during fluoroscopy

[Series 1: cp_standard · 0.18mm/px · 1 of 58 frames shown (1 of 10)]
[frame 9/58]
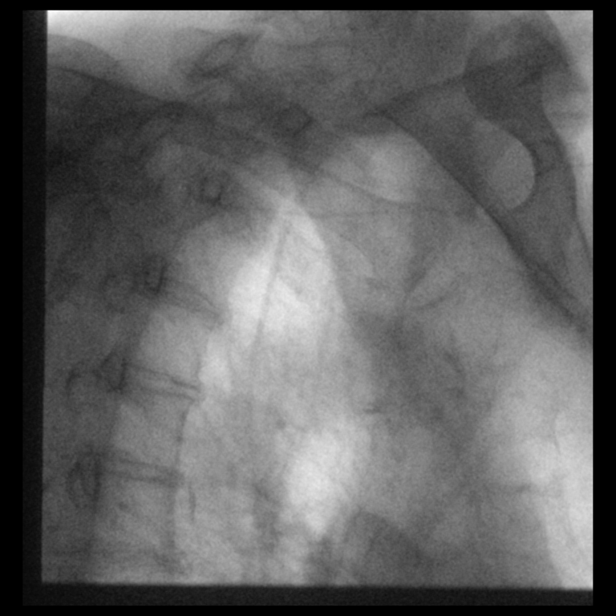

[Series 2: cp_standard · 0.18mm/px · 2 of 32 frames shown (2 of 10)]
[frame 1/32]
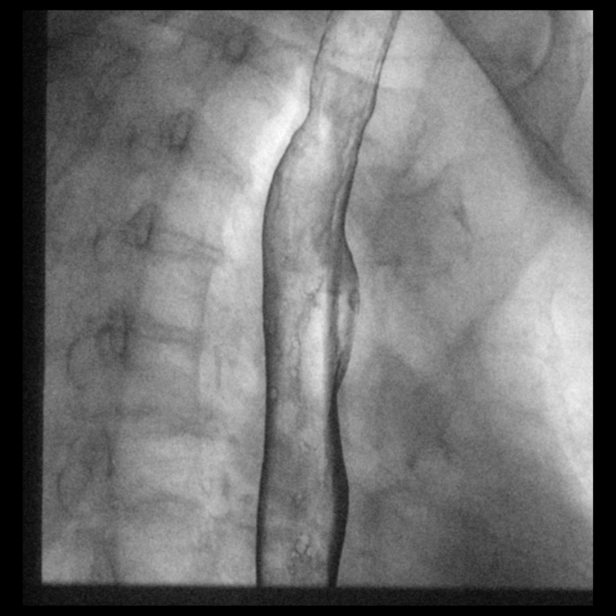
[frame 28/32]
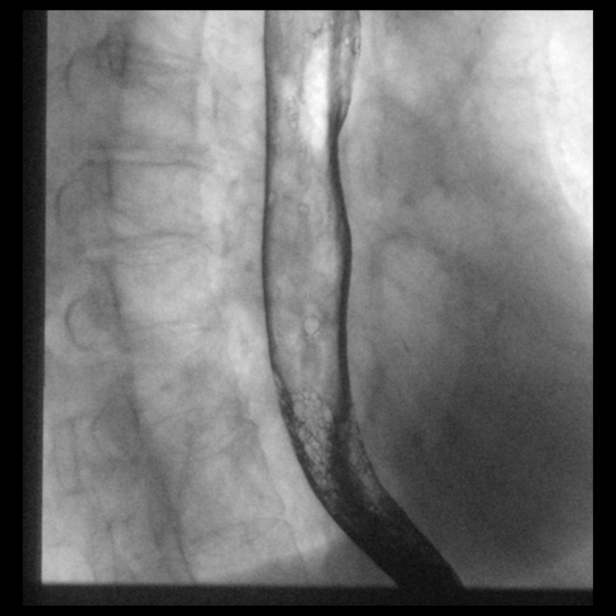

[Series 4: cp_standard · 0.18mm/px · 2 of 24 frames shown (3 of 10)]
[frame 1/24]
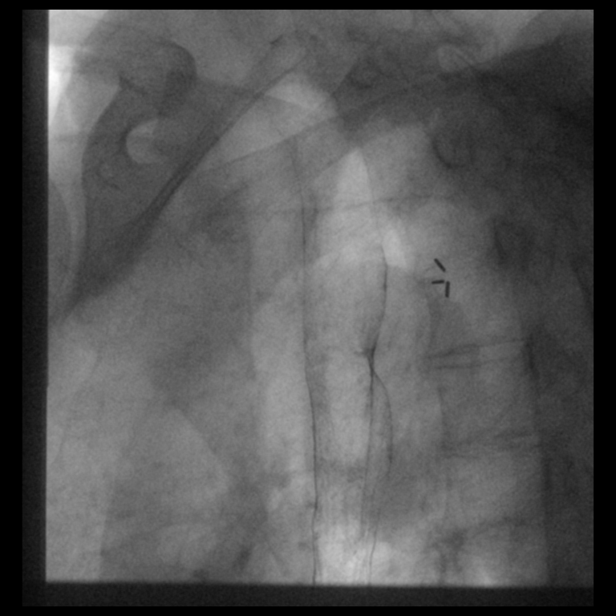
[frame 4/24]
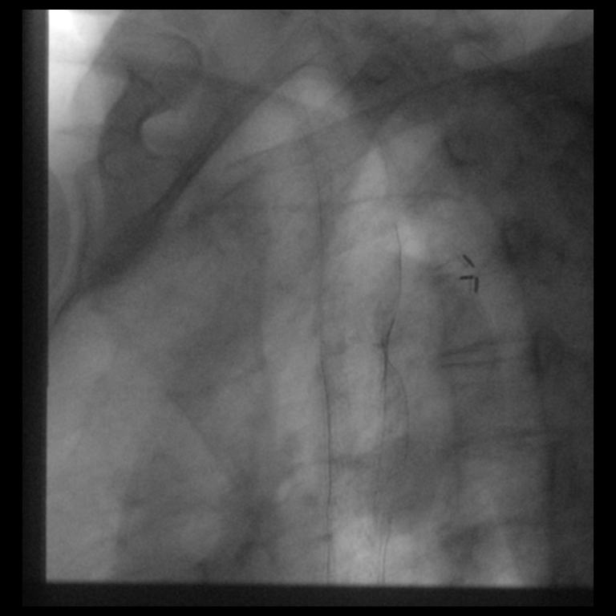

[Series 5: cp_standard · 0.18mm/px · 1 of 39 frames shown (4 of 10)]
[frame 6/39]
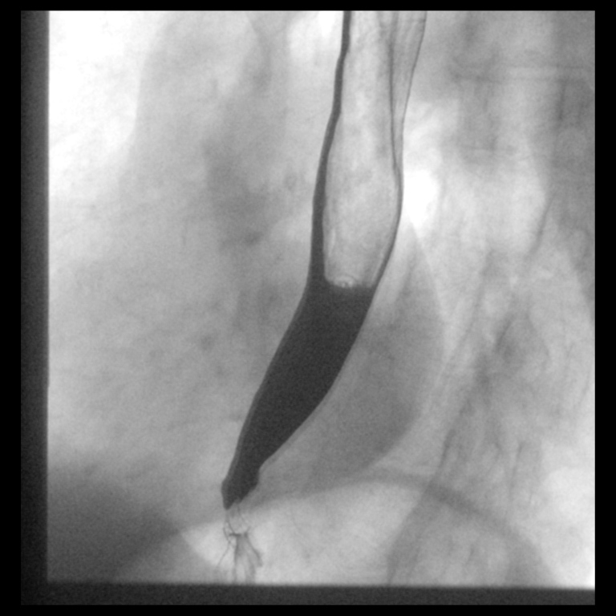

[Series 6: cp_standard · 0.19mm/px · 2 of 89 frames shown (5 of 10)]
[frame 14/89]
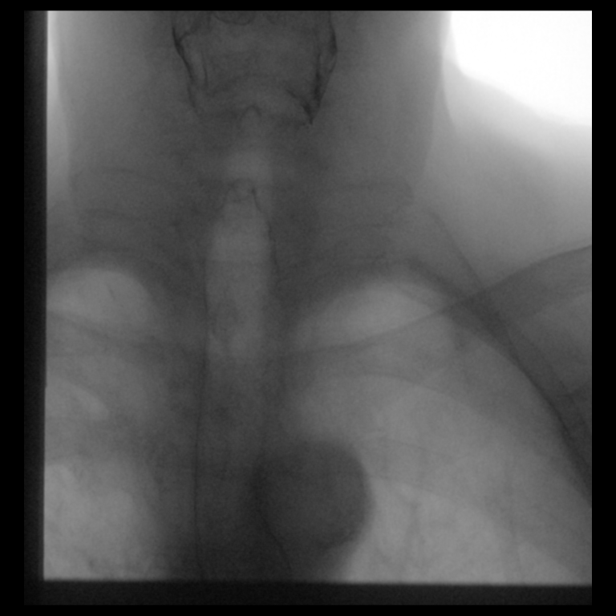
[frame 45/89]
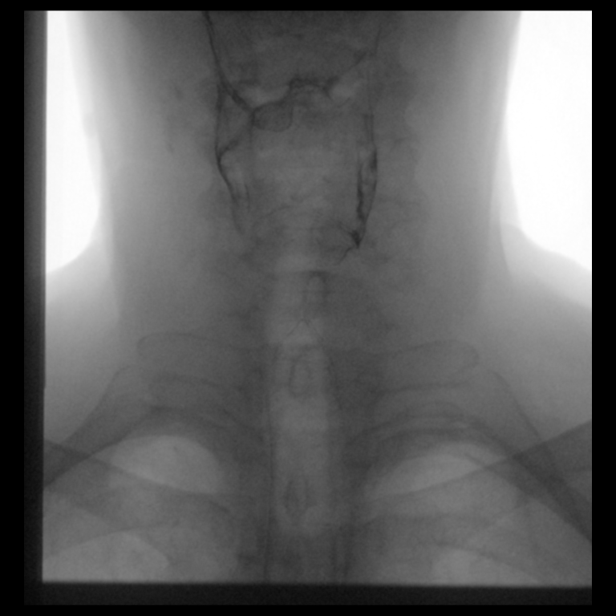

[Series 7: cp_standard · 0.18mm/px · 1 of 49 frames shown (6 of 10)]
[frame 25/49]
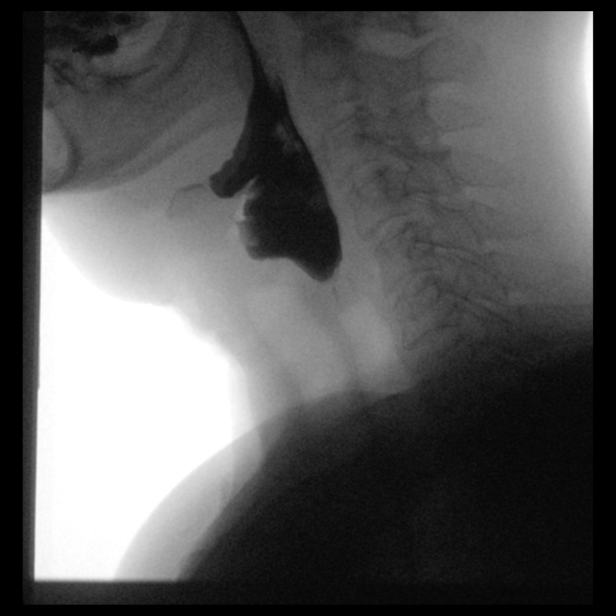

[Series 10: cp_standard · 0.19mm/px · 2 of 73 frames shown (7 of 10)]
[frame 11/73]
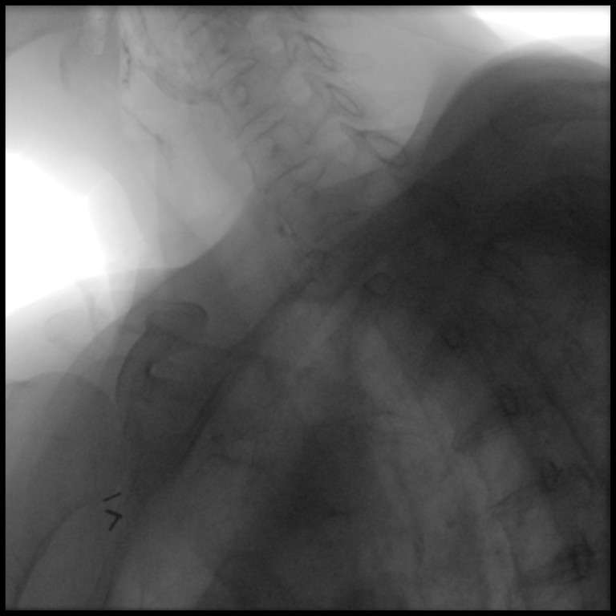
[frame 63/73]
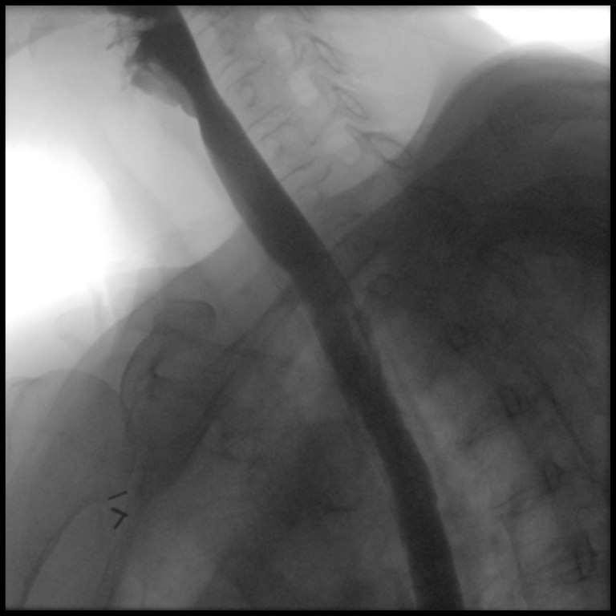

[Series 11: cp_standard · 0.19mm/px · 1 of 40 frames shown (8 of 10)]
[frame 21/40]
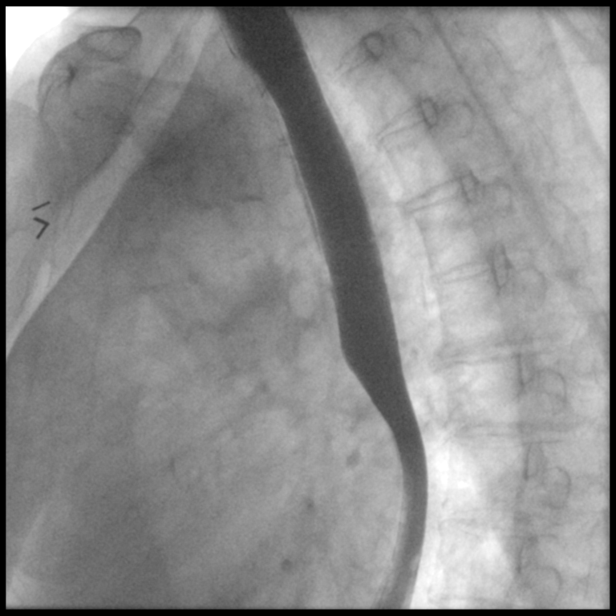

[Series 12: cp_standard · 0.19mm/px · 1 of 138 frames shown (9 of 10)]
[frame 6/138]
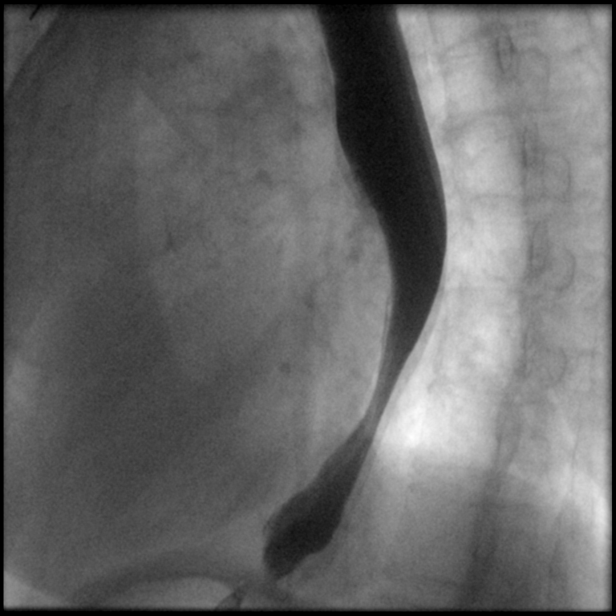

[Series 13: cp_standard · 0.19mm/px · 1 of 1 slices shown (10 of 10)]
[im 1/1]
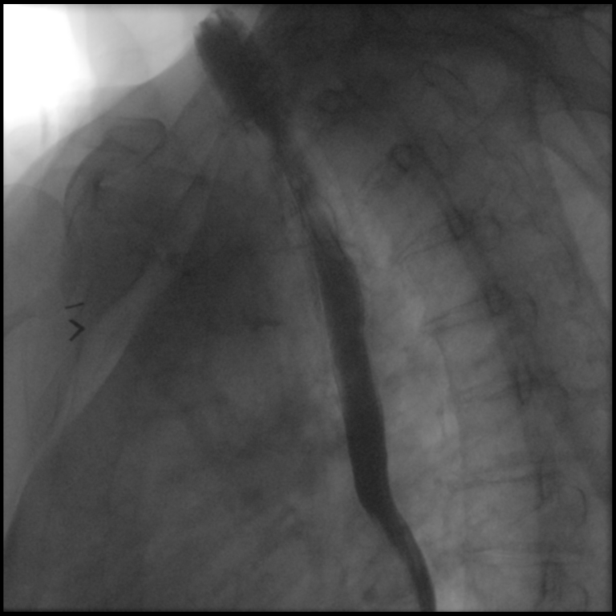

[14 of 24 positions shown; findings below may reference images not displayed]

FINDINGS: Esophageal distention: Normal without mass or stricture

Filling defects:  None

12.5 mm barium tablet: Passed from oral cavity to stomach easily
without obstruction or delay.

Motility: Mild diffuse age-related impairment of esophageal motility
with incomplete clearance of barium by primary peristaltic waves.
Clearance facilitated by repeat swallowing, secondary waves, and
upright positioning/ gravity.

Mucosa:  Smooth without irregularity or ulceration.

Hypopharynx/cervical esophagus: Normal without laryngeal penetration
or aspiration.

Hiatal hernia:  Absent

GE reflux:  Not identified during exam

Other:  N/A
IMPRESSION: Mild age-related esophageal dysmotility.

Otherwise negative exam.

## 2018-10-10 ENCOUNTER — Other Ambulatory Visit: Payer: Self-pay

## 2018-10-10 NOTE — Patient Outreach (Signed)
EMMI Prevent outreach, patient not interested.

## 2018-10-11 DIAGNOSIS — H4312 Vitreous hemorrhage, left eye: Secondary | ICD-10-CM | POA: Diagnosis not present

## 2018-10-11 DIAGNOSIS — H35022 Exudative retinopathy, left eye: Secondary | ICD-10-CM | POA: Diagnosis not present

## 2018-10-11 DIAGNOSIS — H353221 Exudative age-related macular degeneration, left eye, with active choroidal neovascularization: Secondary | ICD-10-CM | POA: Diagnosis not present

## 2018-10-12 DIAGNOSIS — E782 Mixed hyperlipidemia: Secondary | ICD-10-CM | POA: Diagnosis not present

## 2018-10-12 DIAGNOSIS — D509 Iron deficiency anemia, unspecified: Secondary | ICD-10-CM | POA: Diagnosis not present

## 2018-10-12 DIAGNOSIS — E785 Hyperlipidemia, unspecified: Secondary | ICD-10-CM | POA: Diagnosis not present

## 2018-10-12 DIAGNOSIS — R944 Abnormal results of kidney function studies: Secondary | ICD-10-CM | POA: Diagnosis not present

## 2018-10-12 DIAGNOSIS — I482 Chronic atrial fibrillation, unspecified: Secondary | ICD-10-CM | POA: Diagnosis not present

## 2018-10-19 DIAGNOSIS — H35022 Exudative retinopathy, left eye: Secondary | ICD-10-CM | POA: Diagnosis not present

## 2018-10-19 DIAGNOSIS — H353221 Exudative age-related macular degeneration, left eye, with active choroidal neovascularization: Secondary | ICD-10-CM | POA: Diagnosis not present

## 2018-10-19 DIAGNOSIS — H3562 Retinal hemorrhage, left eye: Secondary | ICD-10-CM | POA: Diagnosis not present

## 2018-10-19 DIAGNOSIS — H4312 Vitreous hemorrhage, left eye: Secondary | ICD-10-CM | POA: Diagnosis not present

## 2018-10-25 DIAGNOSIS — H3322 Serous retinal detachment, left eye: Secondary | ICD-10-CM | POA: Diagnosis not present

## 2018-10-25 DIAGNOSIS — H33022 Retinal detachment with multiple breaks, left eye: Secondary | ICD-10-CM | POA: Diagnosis not present

## 2018-10-25 DIAGNOSIS — H35022 Exudative retinopathy, left eye: Secondary | ICD-10-CM | POA: Diagnosis not present

## 2018-10-25 DIAGNOSIS — H3562 Retinal hemorrhage, left eye: Secondary | ICD-10-CM | POA: Diagnosis not present

## 2018-10-25 DIAGNOSIS — H33052 Total retinal detachment, left eye: Secondary | ICD-10-CM | POA: Diagnosis not present

## 2018-10-25 DIAGNOSIS — H4312 Vitreous hemorrhage, left eye: Secondary | ICD-10-CM | POA: Diagnosis not present

## 2018-11-06 ENCOUNTER — Ambulatory Visit (HOSPITAL_COMMUNITY): Payer: Medicare Other

## 2018-11-13 DIAGNOSIS — R944 Abnormal results of kidney function studies: Secondary | ICD-10-CM | POA: Diagnosis not present

## 2018-11-13 DIAGNOSIS — D509 Iron deficiency anemia, unspecified: Secondary | ICD-10-CM | POA: Diagnosis not present

## 2018-11-13 DIAGNOSIS — E785 Hyperlipidemia, unspecified: Secondary | ICD-10-CM | POA: Diagnosis not present

## 2018-11-13 DIAGNOSIS — E782 Mixed hyperlipidemia: Secondary | ICD-10-CM | POA: Diagnosis not present

## 2018-11-13 DIAGNOSIS — I482 Chronic atrial fibrillation, unspecified: Secondary | ICD-10-CM | POA: Diagnosis not present

## 2018-11-14 ENCOUNTER — Encounter (HOSPITAL_COMMUNITY): Admission: RE | Admit: 2018-11-14 | Payer: Medicare Other | Source: Ambulatory Visit

## 2018-11-17 ENCOUNTER — Other Ambulatory Visit: Payer: Self-pay

## 2018-11-17 ENCOUNTER — Encounter (HOSPITAL_COMMUNITY)
Admission: RE | Admit: 2018-11-17 | Discharge: 2018-11-17 | Disposition: A | Discharge status: Home / Self Care | Payer: Medicare Other | Source: Ambulatory Visit | Attending: Internal Medicine | Admitting: Internal Medicine

## 2018-11-17 ENCOUNTER — Encounter (HOSPITAL_COMMUNITY): Payer: Self-pay

## 2018-11-17 DIAGNOSIS — M810 Age-related osteoporosis without current pathological fracture: Secondary | ICD-10-CM | POA: Diagnosis not present

## 2018-11-17 MED ORDER — DENOSUMAB 60 MG/ML ~~LOC~~ SOSY
60.0000 mg | PREFILLED_SYRINGE | Freq: Once | SUBCUTANEOUS | Status: AC
  Administered 2018-11-17: 60 mg via SUBCUTANEOUS

## 2018-11-17 MED ORDER — DENOSUMAB 60 MG/ML ~~LOC~~ SOSY
PREFILLED_SYRINGE | SUBCUTANEOUS | Status: AC
  Filled 2018-11-17: qty 1

## 2018-11-22 DIAGNOSIS — H3322 Serous retinal detachment, left eye: Secondary | ICD-10-CM | POA: Diagnosis not present

## 2018-12-08 DIAGNOSIS — I482 Chronic atrial fibrillation, unspecified: Secondary | ICD-10-CM | POA: Diagnosis not present

## 2018-12-08 DIAGNOSIS — R944 Abnormal results of kidney function studies: Secondary | ICD-10-CM | POA: Diagnosis not present

## 2018-12-08 DIAGNOSIS — E785 Hyperlipidemia, unspecified: Secondary | ICD-10-CM | POA: Diagnosis not present

## 2018-12-08 DIAGNOSIS — D509 Iron deficiency anemia, unspecified: Secondary | ICD-10-CM | POA: Diagnosis not present

## 2018-12-08 DIAGNOSIS — E782 Mixed hyperlipidemia: Secondary | ICD-10-CM | POA: Diagnosis not present

## 2019-01-12 ENCOUNTER — Other Ambulatory Visit: Payer: Self-pay | Admitting: Cardiology

## 2019-01-18 ENCOUNTER — Other Ambulatory Visit: Payer: Self-pay | Admitting: *Deleted

## 2019-01-18 DIAGNOSIS — Z122 Encounter for screening for malignant neoplasm of respiratory organs: Secondary | ICD-10-CM

## 2019-01-18 DIAGNOSIS — Z87891 Personal history of nicotine dependence: Secondary | ICD-10-CM

## 2019-01-19 DIAGNOSIS — D509 Iron deficiency anemia, unspecified: Secondary | ICD-10-CM | POA: Diagnosis not present

## 2019-01-19 DIAGNOSIS — I10 Essential (primary) hypertension: Secondary | ICD-10-CM | POA: Diagnosis not present

## 2019-01-19 DIAGNOSIS — E782 Mixed hyperlipidemia: Secondary | ICD-10-CM | POA: Diagnosis not present

## 2019-01-19 DIAGNOSIS — R7301 Impaired fasting glucose: Secondary | ICD-10-CM | POA: Diagnosis not present

## 2019-01-19 DIAGNOSIS — E785 Hyperlipidemia, unspecified: Secondary | ICD-10-CM | POA: Diagnosis not present

## 2019-01-23 DIAGNOSIS — N1832 Chronic kidney disease, stage 3b: Secondary | ICD-10-CM | POA: Diagnosis not present

## 2019-01-23 DIAGNOSIS — E782 Mixed hyperlipidemia: Secondary | ICD-10-CM | POA: Diagnosis not present

## 2019-01-23 DIAGNOSIS — E039 Hypothyroidism, unspecified: Secondary | ICD-10-CM | POA: Diagnosis not present

## 2019-01-23 DIAGNOSIS — R7301 Impaired fasting glucose: Secondary | ICD-10-CM | POA: Diagnosis not present

## 2019-01-26 ENCOUNTER — Other Ambulatory Visit (HOSPITAL_COMMUNITY): Payer: Self-pay | Admitting: Internal Medicine

## 2019-01-26 DIAGNOSIS — Z1231 Encounter for screening mammogram for malignant neoplasm of breast: Secondary | ICD-10-CM

## 2019-02-08 ENCOUNTER — Other Ambulatory Visit: Payer: Self-pay

## 2019-02-08 ENCOUNTER — Ambulatory Visit (HOSPITAL_COMMUNITY)
Admission: RE | Admit: 2019-02-08 | Discharge: 2019-02-08 | Disposition: A | Discharge status: Home / Self Care | Payer: Medicare Other | Source: Ambulatory Visit | Attending: Acute Care | Admitting: Acute Care

## 2019-02-08 DIAGNOSIS — Z122 Encounter for screening for malignant neoplasm of respiratory organs: Secondary | ICD-10-CM | POA: Insufficient documentation

## 2019-02-08 DIAGNOSIS — I482 Chronic atrial fibrillation, unspecified: Secondary | ICD-10-CM | POA: Diagnosis not present

## 2019-02-08 DIAGNOSIS — E785 Hyperlipidemia, unspecified: Secondary | ICD-10-CM | POA: Diagnosis not present

## 2019-02-08 DIAGNOSIS — Z87891 Personal history of nicotine dependence: Secondary | ICD-10-CM | POA: Insufficient documentation

## 2019-02-15 ENCOUNTER — Telehealth: Payer: Self-pay | Admitting: Acute Care

## 2019-02-15 DIAGNOSIS — Z122 Encounter for screening for malignant neoplasm of respiratory organs: Secondary | ICD-10-CM

## 2019-02-15 DIAGNOSIS — Z87891 Personal history of nicotine dependence: Secondary | ICD-10-CM

## 2019-02-15 NOTE — Telephone Encounter (Signed)
Pt informed of CT results per Sarah Groce, NP.  PT verbalized understanding.  Copy sent to PCP.  Order placed for 1 yr f/u CT.  

## 2019-02-21 DIAGNOSIS — H33052 Total retinal detachment, left eye: Secondary | ICD-10-CM | POA: Diagnosis not present

## 2019-02-21 DIAGNOSIS — H3322 Serous retinal detachment, left eye: Secondary | ICD-10-CM | POA: Diagnosis not present

## 2019-02-21 DIAGNOSIS — H35022 Exudative retinopathy, left eye: Secondary | ICD-10-CM | POA: Diagnosis not present

## 2019-02-21 DIAGNOSIS — H4312 Vitreous hemorrhage, left eye: Secondary | ICD-10-CM | POA: Diagnosis not present

## 2019-03-21 ENCOUNTER — Ambulatory Visit (HOSPITAL_COMMUNITY): Payer: Medicare Other

## 2019-04-09 ENCOUNTER — Ambulatory Visit (HOSPITAL_COMMUNITY): Payer: Medicare Other

## 2019-04-13 ENCOUNTER — Ambulatory Visit (HOSPITAL_COMMUNITY): Payer: Medicare Other

## 2019-04-20 ENCOUNTER — Other Ambulatory Visit: Payer: Self-pay

## 2019-04-20 ENCOUNTER — Ambulatory Visit (HOSPITAL_COMMUNITY)
Admission: RE | Admit: 2019-04-20 | Discharge: 2019-04-20 | Disposition: A | Discharge status: Home / Self Care | Payer: Medicare Other | Source: Ambulatory Visit | Attending: Internal Medicine | Admitting: Internal Medicine

## 2019-04-20 DIAGNOSIS — Z1231 Encounter for screening mammogram for malignant neoplasm of breast: Secondary | ICD-10-CM | POA: Diagnosis not present

## 2019-04-30 ENCOUNTER — Telehealth: Payer: Self-pay | Admitting: Cardiology

## 2019-04-30 NOTE — Telephone Encounter (Signed)

## 2019-05-02 ENCOUNTER — Telehealth (INDEPENDENT_AMBULATORY_CARE_PROVIDER_SITE_OTHER): Payer: Medicare Other | Admitting: Cardiology

## 2019-05-02 ENCOUNTER — Encounter: Payer: Self-pay | Admitting: *Deleted

## 2019-05-02 ENCOUNTER — Encounter: Payer: Self-pay | Admitting: Cardiology

## 2019-05-02 VITALS — Ht 67.0 in | Wt 155.0 lb

## 2019-05-02 DIAGNOSIS — E782 Mixed hyperlipidemia: Secondary | ICD-10-CM | POA: Diagnosis not present

## 2019-05-02 DIAGNOSIS — R0789 Other chest pain: Secondary | ICD-10-CM

## 2019-05-02 DIAGNOSIS — I482 Chronic atrial fibrillation, unspecified: Secondary | ICD-10-CM | POA: Diagnosis not present

## 2019-05-02 DIAGNOSIS — I4892 Unspecified atrial flutter: Secondary | ICD-10-CM | POA: Diagnosis not present

## 2019-05-02 DIAGNOSIS — I129 Hypertensive chronic kidney disease with stage 1 through stage 4 chronic kidney disease, or unspecified chronic kidney disease: Secondary | ICD-10-CM | POA: Diagnosis not present

## 2019-05-02 DIAGNOSIS — I2510 Atherosclerotic heart disease of native coronary artery without angina pectoris: Secondary | ICD-10-CM | POA: Diagnosis not present

## 2019-05-02 DIAGNOSIS — J449 Chronic obstructive pulmonary disease, unspecified: Secondary | ICD-10-CM | POA: Diagnosis not present

## 2019-05-02 DIAGNOSIS — N182 Chronic kidney disease, stage 2 (mild): Secondary | ICD-10-CM | POA: Diagnosis not present

## 2019-05-02 NOTE — Progress Notes (Signed)
Virtual Visit via Telephone Note   This visit type was conducted due to national recommendations for restrictions regarding the COVID-19 Pandemic (e.g. social distancing) in an effort to limit this patient's exposure and mitigate transmission in our community.  Due to her co-morbid illnesses, this patient is at least at moderate risk for complications without adequate follow up.  This format is felt to be most appropriate for this patient at this time.  The patient did not have access to video technology/had technical difficulties with video requiring transitioning to audio format only (telephone).  All issues noted in this document were discussed and addressed.  No physical exam could be performed with this format.  Please refer to the patient's chart for her  consent to telehealth for [facility]   Date:  05/02/2019   ID:  Patricia Jackson, DOB 1941/02/02, MRN [id number]  Patient Location: Home Provider Location: Office  PCP:  Celene Squibb, MD  Cardiologist:  Dr Carlyle Dolly Electrophysiologist:  None   Evaluation Performed:  Follow-Up Visit  Chief Complaint:  Follow up visit  History of Present Illness:    Patricia Jackson is a 79 y.o. female seen todayfor follow up of the following medical problems.  1. Atypical chest pain/CAD - last stent 03/2016 - recent admission with chest pain - described as back pain radiating down both arms that woke her from sleep -started lower neck/upper back, radiatiated to both arms Sharp pain, worst with position. Pain lasted approx 1 hour. No SOB, no diaphoresis, no N/V. No recurrence of pain since discharge - no evidence of ACS by EKG or enzymes - echo 11/2013 LVEF 25-63%, grade I diastolic dysfunction - CT chest withoutaneurysm or dissection, no PE. +atherosclerotic changes in aorta.   -last visit reported  mild nonspecific pains at times. Left chest, pinching feeling. Lasts about 1 minute. Mild dizziness at times. Can occur at rest  or with activity.  -comcpliant with meds  - chornic aytpical neck/chest/arm symptoms, unchnaged    2. Aflutter - admit 11/2015 pneumonia, found to be in aflutter - denies any recent symptoms  - history of severe anemia during admission Jan 2018 while on ASA/plavix/eliquis after stent - at that time eliquis and aspririn stopped, continued on plavix. This is the first time I've seen her since that admission, remains only on plavix.     - last visit we stopped plavix, started on eliquis. - no bleeding since restarting eliquis. She reports a few months ago she was anemic again and pcp coordinated a transfusion, has been monitoring her cbcs. Remains on eliuqis.    3. COPD - followed by pulmonary   4. Anemia - elquis stopped during prior admission with anemia Jan 2018, transfused at that time. She was on brilinta and eliquis at the time, changed to just plavix.   - reports repeat transfusion a few months ago. Labs followed by pcp.   The patient does not have symptoms concerning for COVID-19 infection (fever, chills, cough, or new shortness of breath).    Past Medical History:  Diagnosis Date  . Atrial flutter ([place])    On Eliquis in 8/17 but discontinued after stent placement  . Breast cancer ([place])    remote  . CAD in native artery 03/16/2016  . COPD (chronic obstructive pulmonary disease) ([place])   . Hypertension   . S/P angioplasty with stent 03/15/16 DES Res[place], [place] 03/16/2016   Past Surgical History:  Procedure Laterality Date  . APPENDECTOMY    . BREAST SURGERY  Left   . CARDIAC CATHETERIZATION N/A 03/15/2016   Procedure: Left Heart Cath and Coronary Angiography;  Surgeon: Belva Crome, MD;  Location: [place] [facility];  Service: Cardiovascular;  Laterality: N/A;  . CARDIAC CATHETERIZATION N/A 03/15/2016   Procedure: Coronary Stent Intervention;  Surgeon: Belva Crome, MD;  Location: [place] [facility];  Service: Cardiovascular;  Laterality: N/A;  . COLONOSCOPY      remote  . COLONOSCOPY N/A 05/26/2016   Procedure: COLONOSCOPY;  Surgeon: Daneil Dolin, MD;  [facility];  Service: Endoscopy;  Laterality: N/A;  845  . ESOPHAGOGASTRODUODENOSCOPY N/A 05/26/2016   Procedure: ESOPHAGOGASTRODUODENOSCOPY (EGD);  Surgeon: Daneil Dolin, MD;  Location[facility];  Service: Endoscopy;  Laterality: N/A;  Venia Minks DILATION N/A 05/26/2016   Procedure: Venia Minks DILATION;  Surgeon: Daneil Dolin, MD;  [facility];  Service: Endoscopy;  Laterality: N/A;     No outpatient medications have been marked as taking for the 05/02/19 encounter (Appointment) with Arnoldo Lenis, MD.     Allergies:   Sulfa antibiotics   Social History   Tobacco Use  . Smoking status: Former Smoker    Packs/day: 1.00    Years: 56.00    Pack years: 56.00    Types: Cigarettes    Start date: 12/15/1965    Quit date: 10/29/2015    Years since quitting: 3.5  . Smokeless tobacco: Never Used  Substance Use Topics  . Alcohol use: No    Alcohol/week: 0.0 standard drinks  . Drug use: No     Family Hx: The patient's family history includes COPD in her father; Cancer in her brother and sister; Heart disease in her mother. There is no history of Colon cancer.  ROS:   Please see the history of present illness.     All other systems reviewed and are negative.   Prior CV studies:   The following studies were reviewed today:  11/2013 Echo Study Conclusions  - Left ventricle: The cavity size was normal. Wall thickness was at the upper limits of normal. Systolic function was vigorous. The estimated ejection fraction was in the range of 65% to 70%. Wall motion was normal; there were no regional wall motion abnormalities. Doppler parameters are consistent with abnormal left ventricular relaxation (grade 1 diastolic dysfunction). Doppler parameters are consistent with elevated ventricular end-diastolic filling pressure. - Mitral valve: Calcified annulus.  There was mild regurgitation. - Right atrium: Central venous pressure (est): 3 mm Hg. - Atrial septum: No defect or patent foramen ovale was identified. - Tricuspid valve: There was trivial regurgitation. - Pulmonary arteries: PA peak pressure: 28 mm Hg (S). - Pericardium, extracardiac: There was no pericardial effusion.  Impressions:  - Upper normal LV wall thickness with LVEF 79-89%, grade 1 diastolic dysfunction with increased filling pressures. Mild mitral regurgitation. Trivial tricuspid regurgitation with normal PASP 28 mmHg.  10/2015 Echo Study Conclusions  - Left ventricle: Systolic function was normal. The estimated ejection fraction was in the range of 55% to 60%. Wall motion was normal; there were no regional wall motion abnormalities. 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 - Mitral valve: Calcified annulus. There was mild to moderate regurgitation directed centrally. Valve area by pressure half-time: 2.34 cm^2. - Pulmonary arteries: Systolic pressure was mildly increased. PA peak pressure: 41 mm Hg (S).  03/2016 cath  The left ventricular ejection fraction is 55-65% by visual estimate.  The left ventricular systolic function is normal.  A STENT RESOLUTE ONYX [id number] drug eluting stent was successfully placed, and does not overlap previously placed stent.  Prox Cx to Mid Cx lesion, 99 %stenosed.  Post intervention, there is a 0% residual stenosis.    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  Otherwise widely patent coronary arteries  Successful angioplasty and stenting of the proximal circumflex reducing a greater than 95% stenosis to 0% with TIMI grade 3 flow. Resolute Onyx 2.75 x 12 mm stent deployed at 14 atm.  Recommendations:   Aspirin, Brilinta, and Eliquis x 1 month then drop aspirin. See further instruction below for 3 months and beyond.  Eliquis should start in AM as long as  no recurrent AF/Afl.  Would also consider switch to Plavix at 3 months to decrease the risk of bleeding on Eliquis.  Labs/Other Tests and Data Reviewed:    EKG:  No ECG reviewed.  Recent Labs: No results found for requested labs within last 8760 hours.   Recent Lipid Panel Lab Results  Component Value Date/Time   CHOL 159 03/13/2016 03:24 AM   TRIG 63 03/13/2016 03:24 AM   HDL 70 03/13/2016 03:24 AM   CHOLHDL 2.3 03/13/2016 03:24 AM   LDLCALC 76 03/13/2016 03:24 AM    Wt Readings from Last 3 Encounters:  05/06/18 142 lb (64.4 kg)  01/14/18 142 lb (64.4 kg)  08/26/17 145 lb 8.1 oz (66 kg)     Objective:    Vital Signs:   Today's Vitals   05/02/19 0853  Weight: 155 lb (70.3 kg)  Height: 5' 7"  (1.702 m)   Body mass index is 24.28 kg/m. Normal affect. Normal speech pattern and tone. Comfortable, no apparent distress. No audible sounds of SOB or wheezing.   ASSESSMENT & PLAN:    1. CAD - chronic atypical sympotms unchanged, conitnue current meds  2. Aflutter - no recent symptoms - remains on eliquis, intermittent issues with anmeia followed by her pcp. As of now remains on eliquis, request pcp labs.     F/u 6 months   COVID-19 Education: The signs and symptoms of COVID-19 were discussed with the patient and how to seek care for testing (follow up with PCP or arrange E-visit).  The importance of social distancing was discussed today.  Time:   Today, I have spent 15 minutes with the patient with telehealth technology discussing the above problems.     Medication Adjustments/Labs and Tests Ordered: Current medicines are reviewed at length with the patient today.  Concerns regarding medicines are outlined above.   Tests Ordered: No orders of the defined types were placed in this encounter.   Medication Changes: No orders of the defined types were placed in this encounter.   Follow Up:  Either In Person or Virtual in 6 month(s)  Signed, Carlyle Dolly, MD  05/02/2019 8:05 AM    [place]

## 2019-05-02 NOTE — Patient Instructions (Signed)

## 2019-05-21 ENCOUNTER — Encounter (HOSPITAL_COMMUNITY)
Admission: RE | Admit: 2019-05-21 | Discharge: 2019-05-21 | Disposition: A | Discharge status: Home / Self Care | Payer: Medicare Other | Source: Ambulatory Visit | Attending: Internal Medicine | Admitting: Internal Medicine

## 2019-05-21 ENCOUNTER — Encounter (HOSPITAL_COMMUNITY): Payer: Self-pay

## 2019-05-21 ENCOUNTER — Other Ambulatory Visit: Payer: Self-pay

## 2019-05-21 DIAGNOSIS — M810 Age-related osteoporosis without current pathological fracture: Secondary | ICD-10-CM | POA: Diagnosis not present

## 2019-05-21 MED ORDER — DENOSUMAB 60 MG/ML ~~LOC~~ SOSY
60.0000 mg | PREFILLED_SYRINGE | Freq: Once | SUBCUTANEOUS | Status: AC
  Administered 2019-05-21: 60 mg via SUBCUTANEOUS

## 2019-05-23 DIAGNOSIS — I482 Chronic atrial fibrillation, unspecified: Secondary | ICD-10-CM | POA: Diagnosis not present

## 2019-05-23 DIAGNOSIS — N182 Chronic kidney disease, stage 2 (mild): Secondary | ICD-10-CM | POA: Diagnosis not present

## 2019-05-23 DIAGNOSIS — E7849 Other hyperlipidemia: Secondary | ICD-10-CM | POA: Diagnosis not present

## 2019-05-23 DIAGNOSIS — I129 Hypertensive chronic kidney disease with stage 1 through stage 4 chronic kidney disease, or unspecified chronic kidney disease: Secondary | ICD-10-CM | POA: Diagnosis not present

## 2019-05-23 DIAGNOSIS — H33052 Total retinal detachment, left eye: Secondary | ICD-10-CM | POA: Diagnosis not present

## 2019-05-23 DIAGNOSIS — H3322 Serous retinal detachment, left eye: Secondary | ICD-10-CM | POA: Diagnosis not present

## 2019-05-23 DIAGNOSIS — H31401 Unspecified choroidal detachment, right eye: Secondary | ICD-10-CM | POA: Diagnosis not present

## 2019-05-23 DIAGNOSIS — H35022 Exudative retinopathy, left eye: Secondary | ICD-10-CM | POA: Diagnosis not present

## 2019-05-23 DIAGNOSIS — D509 Iron deficiency anemia, unspecified: Secondary | ICD-10-CM | POA: Diagnosis not present

## 2019-06-01 DIAGNOSIS — E785 Hyperlipidemia, unspecified: Secondary | ICD-10-CM | POA: Diagnosis not present

## 2019-06-01 DIAGNOSIS — N183 Chronic kidney disease, stage 3 unspecified: Secondary | ICD-10-CM | POA: Diagnosis not present

## 2019-06-01 DIAGNOSIS — R7301 Impaired fasting glucose: Secondary | ICD-10-CM | POA: Diagnosis not present

## 2019-06-01 DIAGNOSIS — I482 Chronic atrial fibrillation, unspecified: Secondary | ICD-10-CM | POA: Diagnosis not present

## 2019-06-01 DIAGNOSIS — N1832 Chronic kidney disease, stage 3b: Secondary | ICD-10-CM | POA: Diagnosis not present

## 2019-06-01 DIAGNOSIS — E782 Mixed hyperlipidemia: Secondary | ICD-10-CM | POA: Diagnosis not present

## 2019-06-01 DIAGNOSIS — M810 Age-related osteoporosis without current pathological fracture: Secondary | ICD-10-CM | POA: Diagnosis not present

## 2019-06-01 DIAGNOSIS — R944 Abnormal results of kidney function studies: Secondary | ICD-10-CM | POA: Diagnosis not present

## 2019-06-01 DIAGNOSIS — D509 Iron deficiency anemia, unspecified: Secondary | ICD-10-CM | POA: Diagnosis not present

## 2019-06-08 DIAGNOSIS — R7301 Impaired fasting glucose: Secondary | ICD-10-CM | POA: Diagnosis not present

## 2019-06-08 DIAGNOSIS — N1832 Chronic kidney disease, stage 3b: Secondary | ICD-10-CM | POA: Diagnosis not present

## 2019-06-08 DIAGNOSIS — E039 Hypothyroidism, unspecified: Secondary | ICD-10-CM | POA: Diagnosis not present

## 2019-06-08 DIAGNOSIS — E782 Mixed hyperlipidemia: Secondary | ICD-10-CM | POA: Diagnosis not present

## 2019-06-09 ENCOUNTER — Other Ambulatory Visit: Payer: Self-pay | Admitting: Cardiology

## 2019-06-26 ENCOUNTER — Ambulatory Visit (HOSPITAL_COMMUNITY): Payer: Medicare Other | Admitting: Hematology

## 2019-07-03 ENCOUNTER — Encounter (HOSPITAL_COMMUNITY): Payer: Self-pay

## 2019-07-03 ENCOUNTER — Other Ambulatory Visit: Payer: Self-pay

## 2019-07-04 ENCOUNTER — Inpatient Hospital Stay (HOSPITAL_COMMUNITY): Payer: Medicare Other

## 2019-07-04 ENCOUNTER — Encounter (HOSPITAL_COMMUNITY): Payer: Self-pay | Admitting: Hematology

## 2019-07-04 ENCOUNTER — Inpatient Hospital Stay (HOSPITAL_COMMUNITY): Payer: Medicare Other | Attending: Hematology | Admitting: Hematology

## 2019-07-04 VITALS — BP 150/72 | HR 72 | Temp 96.4°F | Resp 20 | Ht 67.0 in | Wt 161.6 lb

## 2019-07-04 DIAGNOSIS — K5900 Constipation, unspecified: Secondary | ICD-10-CM | POA: Diagnosis not present

## 2019-07-04 DIAGNOSIS — Z853 Personal history of malignant neoplasm of breast: Secondary | ICD-10-CM | POA: Diagnosis not present

## 2019-07-04 DIAGNOSIS — I4891 Unspecified atrial fibrillation: Secondary | ICD-10-CM | POA: Diagnosis not present

## 2019-07-04 DIAGNOSIS — Z7901 Long term (current) use of anticoagulants: Secondary | ICD-10-CM | POA: Diagnosis not present

## 2019-07-04 DIAGNOSIS — D649 Anemia, unspecified: Secondary | ICD-10-CM | POA: Diagnosis not present

## 2019-07-04 DIAGNOSIS — Z8249 Family history of ischemic heart disease and other diseases of the circulatory system: Secondary | ICD-10-CM | POA: Diagnosis not present

## 2019-07-04 DIAGNOSIS — Z803 Family history of malignant neoplasm of breast: Secondary | ICD-10-CM | POA: Insufficient documentation

## 2019-07-04 DIAGNOSIS — Z87891 Personal history of nicotine dependence: Secondary | ICD-10-CM | POA: Insufficient documentation

## 2019-07-04 DIAGNOSIS — I252 Old myocardial infarction: Secondary | ICD-10-CM | POA: Insufficient documentation

## 2019-07-04 DIAGNOSIS — I7300 Raynaud's syndrome without gangrene: Secondary | ICD-10-CM | POA: Diagnosis not present

## 2019-07-04 DIAGNOSIS — Z79899 Other long term (current) drug therapy: Secondary | ICD-10-CM | POA: Insufficient documentation

## 2019-07-04 DIAGNOSIS — K900 Celiac disease: Secondary | ICD-10-CM | POA: Diagnosis not present

## 2019-07-04 DIAGNOSIS — Z809 Family history of malignant neoplasm, unspecified: Secondary | ICD-10-CM | POA: Diagnosis not present

## 2019-07-04 DIAGNOSIS — D696 Thrombocytopenia, unspecified: Secondary | ICD-10-CM | POA: Insufficient documentation

## 2019-07-04 DIAGNOSIS — Z882 Allergy status to sulfonamides status: Secondary | ICD-10-CM | POA: Insufficient documentation

## 2019-07-04 DIAGNOSIS — I220 Subsequent ST elevation (STEMI) myocardial infarction of anterior wall: Secondary | ICD-10-CM

## 2019-07-04 LAB — CBC WITH DIFFERENTIAL/PLATELET
Abs Immature Granulocytes: 0.04 10*3/uL (ref 0.00–0.07)
Basophils Absolute: 0 10*3/uL (ref 0.0–0.1)
Basophils Relative: 1 %
Eosinophils Absolute: 0.2 10*3/uL (ref 0.0–0.5)
Eosinophils Relative: 3 %
HCT: 42.2 % (ref 36.0–46.0)
Hemoglobin: 13 g/dL (ref 12.0–15.0)
Immature Granulocytes: 1 %
Lymphocytes Relative: 20 %
Lymphs Abs: 1.4 10*3/uL (ref 0.7–4.0)
MCH: 30.3 pg (ref 26.0–34.0)
MCHC: 30.8 g/dL (ref 30.0–36.0)
MCV: 98.4 fL (ref 80.0–100.0)
Monocytes Absolute: 0.6 10*3/uL (ref 0.1–1.0)
Monocytes Relative: 8 %
Neutro Abs: 4.6 10*3/uL (ref 1.7–7.7)
Neutrophils Relative %: 67 %
Platelets: 127 10*3/uL — ABNORMAL LOW (ref 150–400)
RBC: 4.29 MIL/uL (ref 3.87–5.11)
RDW: 14.2 % (ref 11.5–15.5)
WBC: 6.8 10*3/uL (ref 4.0–10.5)
nRBC: 0 % (ref 0.0–0.2)

## 2019-07-04 LAB — COMPREHENSIVE METABOLIC PANEL
ALT: 27 U/L (ref 0–44)
AST: 40 U/L (ref 15–41)
Albumin: 4.1 g/dL (ref 3.5–5.0)
Alkaline Phosphatase: 66 U/L (ref 38–126)
Anion gap: 10 (ref 5–15)
BUN: 33 mg/dL — ABNORMAL HIGH (ref 8–23)
CO2: 20 mmol/L — ABNORMAL LOW (ref 22–32)
Calcium: 8.9 mg/dL (ref 8.9–10.3)
Chloride: 109 mmol/L (ref 98–111)
Creatinine, Ser: 1.84 mg/dL — ABNORMAL HIGH (ref 0.44–1.00)
GFR calc Af Amer: 30 mL/min — ABNORMAL LOW (ref >60)
GFR calc non Af Amer: 26 mL/min — ABNORMAL LOW (ref >60)
Glucose, Bld: 94 mg/dL (ref 70–99)
Potassium: 4.4 mmol/L (ref 3.5–5.1)
Sodium: 139 mmol/L (ref 135–145)
Total Bilirubin: 0.5 mg/dL (ref 0.3–1.2)
Total Protein: 6.8 g/dL (ref 6.5–8.1)

## 2019-07-04 LAB — RETICULOCYTES
Immature Retic Fract: 16.1 % — ABNORMAL HIGH (ref 2.3–15.9)
RBC.: 4.24 MIL/uL (ref 3.87–5.11)
Retic Count, Absolute: 56.4 10*3/uL (ref 19.0–186.0)
Retic Ct Pct: 1.3 % (ref 0.4–3.1)

## 2019-07-04 LAB — SAVE SMEAR(SSMR), FOR PROVIDER SLIDE REVIEW

## 2019-07-04 LAB — IRON AND TIBC
Iron: 60 ug/dL (ref 28–170)
Saturation Ratios: 17 % (ref 10.4–31.8)
TIBC: 344 ug/dL (ref 250–450)
UIBC: 284 ug/dL

## 2019-07-04 LAB — HEPATITIS B SURFACE ANTIGEN: Hepatitis B Surface Ag: NONREACTIVE

## 2019-07-04 LAB — SEDIMENTATION RATE: Sed Rate: 24 mm/hr — ABNORMAL HIGH (ref 0–22)

## 2019-07-04 LAB — FERRITIN: Ferritin: 89 ng/mL (ref 11–307)

## 2019-07-04 LAB — VITAMIN D 25 HYDROXY (VIT D DEFICIENCY, FRACTURES): Vit D, 25-Hydroxy: 27.64 ng/mL — ABNORMAL LOW (ref 30–100)

## 2019-07-04 LAB — VITAMIN B12: Vitamin B-12: 538 pg/mL (ref 180–914)

## 2019-07-04 LAB — C-REACTIVE PROTEIN: CRP: 0.6 mg/dL (ref <1.0)

## 2019-07-04 LAB — HEPATITIS B CORE ANTIBODY, IGM: Hep B C IgM: NONREACTIVE

## 2019-07-04 LAB — HEPATITIS B SURFACE ANTIBODY,QUALITATIVE: Hep B S Ab: NONREACTIVE

## 2019-07-04 LAB — LACTATE DEHYDROGENASE: LDH: 172 U/L (ref 98–192)

## 2019-07-04 NOTE — Patient Instructions (Addendum)
[place] at [facility]  Discharge Instructions: Follow up in 2-3 weeks  You saw Francene Finders, NP, and Dr. Delton Coombes today. _______________________________________________________________  Thank you for choosing [facility] to provide your oncology and hematology care.  To afford each patient quality time with our providers, please arrive at least 15 minutes before your scheduled appointment.  You need to re-schedule your appointment if you arrive 10 or more minutes late.  We strive to give you quality time with our providers, and arriving late affects you and other patients whose appointments are after yours.  Also, if you no show three or more times for appointments you may be dismissed from the clinic.  Again, thank you for choosing [place] [facility] [place] hope is that these requests will allow you access to exceptional care and in a timely manner. _______________________________________________________________  If you have questions after your visit, please contact our office at [phone] between the hours of 8:30 a.m. and 5:00 p.m. Voicemails left after 4:30 p.m. will not be returned until the following business day. _______________________________________________________________  For prescription refill requests, have your pharmacy contact our office. _______________________________________________________________  Recommendations made by the consultant and any test results will be sent to your referring physician. _______________________________________________________________

## 2019-07-04 NOTE — Assessment & Plan Note (Signed)
1.  Thrombocytopenia: -Platelet count at Dr. Juel Burrow office on recent CBC was 62. -She has easy bruising but denies any nosebleeds, hematuria or bleeding per rectum. -She reports changing colors of her fingers to read and they feel very cold.  Also reports change in color of her toes. -She is on Eliquis twice daily for her A. fib. -We will repeat her platelet count in blue top tube.  We will rule out nutritional deficiencies including F81, folic acid, methylmalonic acid and copper levels.  We will also check LDH and SPEP. -We will check for hepatitis serology.  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  We will also send cryoglobulins. -Differential diagnosis includes immune mediated thrombocytopenia versus MDS. -We will see her back in 2 to 3 weeks for follow-up.  2.  Tobacco abuse: -She smoked 2 packs/day for 60 years. -CT chest lung cancer screening scan on 02/08/2019 was lung RADS 2.  3.  History of anemia: -She is taking iron tablet daily. -She had a history of blood transfusion in 2018.  We will check iron, W29, folic acid.

## 2019-07-04 NOTE — Progress Notes (Signed)
CONSULT NOTE  Patient Care Team: Celene Squibb, MD as PCP - General (Internal Medicine) Harl Bowie, Alphonse Guild, MD as Consulting Physician (Cardiology) Marshell Garfinkel, MD as Consulting Physician (Pulmonary Disease) Gala Romney Cristopher Estimable, MD as Consulting Physician (Gastroenterology)  CHIEF COMPLAINTS/PURPOSE OF CONSULTATION: Thrombocytopenia and anemia  HISTORY OF PRESENTING ILLNESS:  Patricia Jackson 79 y.o. female was sent here for thrombocytopenia and anemia.  Patient's platelets have been normal up into her last visit with her PCP where her platelets dropped to 62.  She denies any bright red bleeding per rectum or melena.  She denies any easy bruising or bleeding.  Patient reports she has had dark stools due to taking an iron tablet daily.  She also reports she stays constipated having only one bowel movement per week.  Patient denies any petechiae rash.  Patient denies any recent blood transfusions.  She was hospitalized in 2018 after a stent was placed and she was placed on a blood thinner.  She has not needed any blood since.  Patient does report she has discoloration of her fingers and toes that are bluish-purple.  She reports they stay cold and numb.  Patient does have a history of left breast cancer where she received chemotherapy but no radiation.  She received all of her care in [place].  She denies a history of blood clots.  Patient reports she started smoking at the age of 89 and smoked 2 packs/day.  She stopped in 2018 after her heart attack.  Patient denies a family history of anemia.  Patient denies any pica.  She denies any new medications including steroids or antibiotics.  She denies any recent chest pain on exertion, shortness of breath on minimal exertion, presyncopal episodes or palpitations.  She has not noticed any recent bleeding such as epistasis, hematuria or hematochezia.  Patient reports a family history of a brother with metastatic cancer unknown primary.  And a sister with  breast cancer.  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  She was not exposed to any harmful chemicals.    MEDICAL HISTORY:  Past Medical History:  Diagnosis Date  . Atrial flutter ([place])    On Eliquis in 8/17 but discontinued after stent placement  . Breast cancer ([place])    remote  . CAD in native artery 03/16/2016  . COPD (chronic obstructive pulmonary disease) ([place])   . Hypertension   . S/P angioplasty with stent 03/15/16 [facility] [place], [place] 03/16/2016    SURGICAL HISTORY: Past Surgical History:  Procedure Laterality Date  . APPENDECTOMY    . BREAST SURGERY Left   . CARDIAC CATHETERIZATION N/A 03/15/2016   Procedure: Left Heart Cath and Coronary Angiography;  Surgeon: Belva Crome, MD;  Location: [facility];  Service: Cardiovascular;  Laterality: N/A;  . CARDIAC CATHETERIZATION N/A 03/15/2016   Procedure: Coronary Stent Intervention;  Surgeon: Belva Crome, MD;  Location: [facility];  Service: Cardiovascular;  Laterality: N/A;  . COLONOSCOPY     remote  . COLONOSCOPY N/A 05/26/2016   Procedure: COLONOSCOPY;  Surgeon: Daneil Dolin, MD;  [facility];  Service: Endoscopy;  Laterality: N/A;  845  . ESOPHAGOGASTRODUODENOSCOPY N/A 05/26/2016   Procedure: ESOPHAGOGASTRODUODENOSCOPY (EGD);  Surgeon: Daneil Dolin, MD;  [facility];  Service: Endoscopy;  Laterality: N/A;  Venia Minks DILATION N/A 05/26/2016   Procedure: Venia Minks DILATION;  Surgeon: Daneil Dolin, MD;  [facility];  Service:  Endoscopy;  Laterality: N/A;    SOCIAL HISTORY: Social History   Socioeconomic History  . Marital status: Divorced    Spouse name: Not on file  . Number of children: 1  . Years of education: Not on file  . Highest education level: Not on file  Occupational History  . Occupation: Retired  Tobacco Use  . Smoking status: Former Smoker    Packs/day: 1.00    Years: 56.00    Pack years: 56.00    Types:  Cigarettes    Start date: 12/15/1965    Quit date: 10/29/2015    Years since quitting: 3.6  . Smokeless tobacco: Never Used  Substance and Sexual Activity  . Alcohol use: No    Alcohol/week: 0.0 standard drinks  . Drug use: No  . Sexual activity: Not Currently  Other Topics Concern  . Not on file  Social History Narrative  . Not on file   Social Determinants of Health   Financial Resource Strain:   . Difficulty of Paying Living Expenses:   Food Insecurity:   . Worried About Charity fundraiser in the Last Year:   . Arboriculturist in the Last Year:   Transportation Needs:   . Film/video editor (Medical):   Marland Kitchen Lack of Transportation (Non-Medical):   Physical Activity:   . Days of Exercise per Week:   . Minutes of Exercise per Session:   Stress:   . Feeling of Stress :   Social Connections:   . Frequency of Communication with Friends and Family:   . Frequency of Social Gatherings with Friends and Family:   . Attends Religious Services:   . Active Member of Clubs or Organizations:   . Attends Archivist Meetings:   Marland Kitchen Marital Status:   Intimate Partner Violence:   . Fear of Current or Ex-Partner:   . Emotionally Abused:   Marland Kitchen Physically Abused:   . Sexually Abused:     FAMILY HISTORY: Family History  Problem Relation Age of Onset  . Heart disease Mother   . COPD Father   . Cancer Sister        unknown primary  . Cancer Brother        unknown primary  . Cancer Brother   . Hypertension Son   . Colon cancer Neg Hx     ALLERGIES:  is allergic to sulfa antibiotics.  MEDICATIONS:  Current Outpatient Medications  Medication Sig Dispense Refill  . acetaminophen (TYLENOL) 500 MG tablet Take 1,500 mg by mouth every 4 (four) hours as needed.    Marland Kitchen albuterol (PROVENTIL HFA;VENTOLIN HFA) 108 (90 Base) MCG/ACT inhaler Inhale 2 puffs into the lungs every 4 (four) hours as needed.    Marland Kitchen buPROPion (WELLBUTRIN XL) 300 MG 24 hr tablet Take 300 mg by mouth daily.      Marland Kitchen denosumab (PROLIA) 60 MG/ML SOSY injection Inject 60 mg into the skin every 6 (six) months.    Marland Kitchen ELIQUIS 5 MG TABS tablet TAKE ONE TABLET (5MG  TOTAL) BY MOUTH TWOTIMES DAILY 60 tablet 6  . ferrous sulfate 325 (65 FE) MG tablet Take 325 mg by mouth daily with breakfast.     . Fluticasone-Umeclidin-Vilant (TRELEGY ELLIPTA) 100-62.5-25 MCG/INH AEPB Inhale 1 puff into the lungs daily. 60 each 6  . levothyroxine (SYNTHROID) 50 MCG tablet Take 50 mcg by mouth daily before breakfast.    . metoprolol succinate (TOPROL-XL) 25 MG 24 hr tablet Take 50 mg by mouth daily.    Marland Kitchen  nitroGLYCERIN (NITROSTAT) 0.4 MG SL tablet PLACE ONE TABLET (0.4MG  TOTAL) UNDER THETONGUE EVERY 5 MINUTES AS NEEDED (Patient not taking: Reported on 07/03/2019) 25 tablet 3  . rosuvastatin (CRESTOR) 20 MG tablet Take 20 mg by mouth daily.     . sertraline (ZOLOFT) 25 MG tablet Take 25 mg by mouth daily.     No current facility-administered medications for this visit.    REVIEW OF SYSTEMS:   Constitutional: Denies fevers, chills or abnormal night sweats.  Positive for dizziness. Respiratory: Shortness of breath with activity. Cardiovascular: Denies palpitation, chest discomfort or lower extremity swelling Gastrointestinal: Positive for constipation. Skin: Denies abnormal skin rashes Lymphatics: Denies new lymphadenopathy or easy bruising Neurological:Denies numbness, tingling or new weaknesses Behavioral/Psych: Mood is stable, no new changes  All other systems were reviewed with the patient and are negative.  PHYSICAL EXAMINATION: ECOG PERFORMANCE STATUS: 1 - Symptomatic but completely ambulatory  Vitals:   07/04/19 1327  BP: (!) 150/72  Pulse: 72  Resp: 20  Temp: (!) 96.4 F (35.8 C)  SpO2: 93%   Filed Weights   07/04/19 1327  Weight: 161 lb 9.6 oz (73.3 kg)    GENERAL:alert, no distress and comfortable SKIN: skin color, texture, turgor are normal, no rashes or significant lesions NECK: supple, thyroid normal  size, non-tender, without nodularity LYMPH:  no palpable lymphadenopathy in the cervical, axillary or inguinal LUNGS: clear to auscultation and percussion with normal breathing effort HEART: regular rate & rhythm and no murmurs and no lower extremity edema ABDOMEN:abdomen soft, non-tender and normal bowel sounds Musculoskeletal:no cyanosis of digits and no clubbing  PSYCH: alert & oriented x 3 with fluent speech NEURO: no focal motor/sensory deficits Purplish discoloration of toes.  Erythema of the fingertips.  LABORATORY DATA:  I have reviewed the data as listed Recent Results (from the past 2160 hour(s))  Save Smear [facility])     Status: None   Collection Time: 07/04/19  2:55 PM  Result Value Ref Range   Smear Review SMEAR STAINED AND AVAILABLE FOR REVIEW     Comment: Performed at [facility], [address] [place], [place] [address]  Reticulocytes     Status: Abnormal   Collection Time: 07/04/19  2:55 PM  Result Value Ref Range   Retic Ct Pct 1.3 0.4 - 3.1 %   RBC. 4.24 3.87 - 5.11 MIL/uL   Retic Count, Absolute 56.4 19.0 - 186.0 K/uL   Immature Retic Fract 16.1 (H) 2.3 - 15.9 %    Comment: Performed at [facility], [address] [place], [place] [address]  Vitamin B12     Status: None   Collection Time: 07/04/19  2:55 PM  Result Value Ref Range   Vitamin B-12 538 180 - 914 pg/mL    Comment: (NOTE) This assay is not validated for testing neonatal or myeloproliferative syndrome specimens for Vitamin B12 levels. Performed at [facility], [address] [place], [place] [address]   Iron and TIBC     Status: None   Collection Time: 07/04/19  2:55 PM  Result Value Ref Range   Iron 60 28 - 170 ug/dL   TIBC 344 250 - 450 ug/dL   Saturation Ratios 17 10.4 - 31.8 %   UIBC 284 ug/dL    Comment: Performed at [facility], [address], [place], [place] [address]  Ferritin     Status: None   Collection Time: 07/04/19  2:55 PM  Result Value Ref Range   Ferritin 89 11 - 307  ng/mL    Comment: Performed at [facility], [address] [place], [place] [address]  Comprehensive metabolic panel     Status: Abnormal   Collection Time: 07/04/19  2:55 PM  Result Value Ref Range   Sodium 139 135 - 145 mmol/L   Potassium 4.4 3.5 - 5.1 mmol/L   Chloride 109 98 - 111 mmol/L   CO2 20 (L) 22 - 32 mmol/L   Glucose, Bld 94 70 - 99 mg/dL    Comment: Glucose reference range applies only to samples taken after fasting for at least 8 hours.   BUN 33 (H) 8 - 23 mg/dL   Creatinine, Ser 1.84 (H) 0.44 - 1.00 mg/dL   Calcium 8.9 8.9 - 10.3 mg/dL   Total Protein 6.8 6.5 - 8.1 g/dL   Albumin 4.1 3.5 - 5.0 g/dL   AST 40 15 - 41 U/L   ALT 27 0 - 44 U/L   Alkaline Phosphatase 66 38 - 126 U/L   Total Bilirubin 0.5 0.3 - 1.2 mg/dL   GFR calc non Af Amer 26 (L) >60 mL/min   GFR calc Af Amer 30 (L) >60 mL/min   Anion gap 10 5 - 15    Comment: Performed at [facility], [address], [place], [place] [address]  CBC with Differential/Platelet     Status: Abnormal   Collection Time: 07/04/19  2:55 PM  Result Value Ref Range   WBC 6.8 4.0 - 10.5 K/uL   RBC 4.29 3.87 - 5.11 MIL/uL   Hemoglobin 13.0 12.0 - 15.0 g/dL   HCT 42.2 36.0 - 46.0 %   MCV 98.4 80.0 - 100.0 fL   MCH 30.3 26.0 - 34.0 pg   MCHC 30.8 30.0 - 36.0 g/dL   RDW 14.2 11.5 - 15.5 %   Platelets 127 (L) 150 - 400 K/uL    Comment: PLATELET COUNT CONFIRMED BY SMEAR Immature Platelet Fraction may be clinically indicated, consider ordering this additional test [id number] OCCASIONAL PLATELET CLUMP SEEN large platelets seen    nRBC 0.0 0.0 - 0.2 %   Neutrophils Relative % 67 %   Neutro Abs 4.6 1.7 - 7.7 K/uL   Lymphocytes Relative 20 %   Lymphs Abs 1.4 0.7 - 4.0 K/uL   Monocytes Relative 8 %   Monocytes Absolute 0.6 0.1 - 1.0 K/uL   Eosinophils Relative 3 %   Eosinophils Absolute 0.2 0.0 - 0.5 K/uL   Basophils Relative 1 %   Basophils Absolute 0.0 0.0 - 0.1 K/uL   Immature Granulocytes 1 %   Abs Immature  Granulocytes 0.04 0.00 - 0.07 K/uL    Comment: Performed at [facility], [address] [place], [place] [address]  Lactate dehydrogenase     Status: None   Collection Time: 07/04/19  2:55 PM  Result Value Ref Range   LDH 172 98 - 192 U/L    Comment: Performed at [facility], [address] [place], [place] [address]  C-reactive protein     Status: None   Collection Time: 07/04/19  2:55 PM  Result Value Ref Range   CRP 0.6 <1.0 mg/dL    Comment: Performed at [facility], [address], [place], [place] [address]  Sedimentation rate     Status: Abnormal   Collection Time: 07/04/19  2:55 PM  Result Value Ref Range   Sed Rate 24 (H) 0 - 22 mm/hr    Comment: Performed at [facility], [address], [place], [place] [address]  RADIOGRAPHIC STUDIES: I have personally reviewed the radiological images as listed and agreed with the findings in the report.  ASSESSMENT & PLAN:  Thrombocytopenia ([place]) 1.  Thrombocytopenia: -Platelet count at Dr. Juel Burrow office on recent CBC was 62. -She has easy bruising but denies any nosebleeds, hematuria or bleeding per rectum. -She reports changing colors of her fingers to read and they feel very cold.  Also reports change in color of her toes. -She is on Eliquis twice daily for her A. fib. -We will repeat her platelet count in blue top tube.  We will rule out nutritional deficiencies including N99, folic acid, methylmalonic acid and copper levels.  We will also check LDH and SPEP. -We will check for hepatitis serology.  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  We will also send cryoglobulins. -Differential diagnosis includes immune mediated thrombocytopenia versus MDS. -We will see her back in 2 to 3 weeks for follow-up.  2.  Tobacco abuse: -She smoked 2 packs/day for 60 years. -CT chest lung cancer screening scan on 02/08/2019 was lung RADS 2.  3.  History of  anemia: -She is taking iron tablet daily. -She had a history of blood transfusion in 2018.  We will check iron, V44, folic acid.     All questions were answered. The patient knows to call the clinic with any problems, questions or concerns.     Derek Jack, MD 07/04/19 5:51 PM

## 2019-07-05 LAB — PROTEIN ELECTROPHORESIS, SERUM
A/G Ratio: 1.5 (ref 0.7–1.7)
Albumin ELP: 4 g/dL (ref 2.9–4.4)
Alpha-1-Globulin: 0.2 g/dL (ref 0.0–0.4)
Alpha-2-Globulin: 0.8 g/dL (ref 0.4–1.0)
Beta Globulin: 0.8 g/dL (ref 0.7–1.3)
Gamma Globulin: 0.8 g/dL (ref 0.4–1.8)
Globulin, Total: 2.6 g/dL (ref 2.2–3.9)
Total Protein ELP: 6.6 g/dL (ref 6.0–8.5)

## 2019-07-05 LAB — RHEUMATOID FACTOR: Rheumatoid fact SerPl-aCnc: <10 IU/mL (ref 0.0–13.9)

## 2019-07-05 LAB — HAPTOGLOBIN: Haptoglobin: 121 mg/dL (ref 42–346)

## 2019-07-06 ENCOUNTER — Emergency Department (HOSPITAL_COMMUNITY): Payer: Medicare Other

## 2019-07-06 ENCOUNTER — Other Ambulatory Visit: Payer: Self-pay

## 2019-07-06 ENCOUNTER — Emergency Department (HOSPITAL_COMMUNITY)
Admission: EM | Admit: 2019-07-06 | Discharge: 2019-07-06 | Disposition: A | Discharge status: Home / Self Care | Payer: Medicare Other | Attending: Emergency Medicine | Admitting: Emergency Medicine

## 2019-07-06 ENCOUNTER — Encounter (HOSPITAL_COMMUNITY): Payer: Self-pay | Admitting: *Deleted

## 2019-07-06 DIAGNOSIS — Z20822 Contact with and (suspected) exposure to covid-19: Secondary | ICD-10-CM | POA: Diagnosis not present

## 2019-07-06 DIAGNOSIS — Z7901 Long term (current) use of anticoagulants: Secondary | ICD-10-CM | POA: Diagnosis not present

## 2019-07-06 DIAGNOSIS — R05 Cough: Secondary | ICD-10-CM | POA: Diagnosis not present

## 2019-07-06 DIAGNOSIS — I129 Hypertensive chronic kidney disease with stage 1 through stage 4 chronic kidney disease, or unspecified chronic kidney disease: Secondary | ICD-10-CM | POA: Insufficient documentation

## 2019-07-06 DIAGNOSIS — J449 Chronic obstructive pulmonary disease, unspecified: Secondary | ICD-10-CM | POA: Insufficient documentation

## 2019-07-06 DIAGNOSIS — R0602 Shortness of breath: Secondary | ICD-10-CM | POA: Diagnosis not present

## 2019-07-06 DIAGNOSIS — I2510 Atherosclerotic heart disease of native coronary artery without angina pectoris: Secondary | ICD-10-CM | POA: Insufficient documentation

## 2019-07-06 DIAGNOSIS — Z79899 Other long term (current) drug therapy: Secondary | ICD-10-CM | POA: Diagnosis not present

## 2019-07-06 DIAGNOSIS — Z853 Personal history of malignant neoplasm of breast: Secondary | ICD-10-CM | POA: Insufficient documentation

## 2019-07-06 DIAGNOSIS — Z87891 Personal history of nicotine dependence: Secondary | ICD-10-CM | POA: Diagnosis not present

## 2019-07-06 DIAGNOSIS — N184 Chronic kidney disease, stage 4 (severe): Secondary | ICD-10-CM | POA: Diagnosis not present

## 2019-07-06 DIAGNOSIS — J209 Acute bronchitis, unspecified: Secondary | ICD-10-CM | POA: Diagnosis not present

## 2019-07-06 LAB — FANA STAINING PATTERNS
Homogeneous Pattern: 1:1280 {titer} — ABNORMAL HIGH
Speckled Pattern: 1:320 {titer} — ABNORMAL HIGH

## 2019-07-06 LAB — ANTINUCLEAR ANTIBODIES, IFA: ANA Ab, IFA: POSITIVE — AB

## 2019-07-06 LAB — CBC WITH DIFFERENTIAL/PLATELET
Abs Immature Granulocytes: 0.01 10*3/uL (ref 0.00–0.07)
Basophils Absolute: 0 10*3/uL (ref 0.0–0.1)
Basophils Relative: 0 %
Eosinophils Absolute: 0.2 10*3/uL (ref 0.0–0.5)
Eosinophils Relative: 3 %
HCT: 39.7 % (ref 36.0–46.0)
Hemoglobin: 12.4 g/dL (ref 12.0–15.0)
Immature Granulocytes: 0 %
Lymphocytes Relative: 16 %
Lymphs Abs: 1.2 10*3/uL (ref 0.7–4.0)
MCH: 30.6 pg (ref 26.0–34.0)
MCHC: 31.2 g/dL (ref 30.0–36.0)
MCV: 98 fL (ref 80.0–100.0)
Monocytes Absolute: 0.8 10*3/uL (ref 0.1–1.0)
Monocytes Relative: 12 %
Neutro Abs: 5 10*3/uL (ref 1.7–7.7)
Neutrophils Relative %: 69 %
Platelets: 112 10*3/uL — ABNORMAL LOW (ref 150–400)
RBC: 4.05 MIL/uL (ref 3.87–5.11)
RDW: 14.2 % (ref 11.5–15.5)
WBC: 7.3 10*3/uL (ref 4.0–10.5)
nRBC: 0 % (ref 0.0–0.2)

## 2019-07-06 LAB — BASIC METABOLIC PANEL
Anion gap: 5 (ref 5–15)
BUN: 26 mg/dL — ABNORMAL HIGH (ref 8–23)
CO2: 22 mmol/L (ref 22–32)
Calcium: 9.1 mg/dL (ref 8.9–10.3)
Chloride: 114 mmol/L — ABNORMAL HIGH (ref 98–111)
Creatinine, Ser: 1.53 mg/dL — ABNORMAL HIGH (ref 0.44–1.00)
GFR calc Af Amer: 38 mL/min — ABNORMAL LOW (ref >60)
GFR calc non Af Amer: 33 mL/min — ABNORMAL LOW (ref >60)
Glucose, Bld: 104 mg/dL — ABNORMAL HIGH (ref 70–99)
Potassium: 4.5 mmol/L (ref 3.5–5.1)
Sodium: 141 mmol/L (ref 135–145)

## 2019-07-06 LAB — RESPIRATORY PANEL BY RT PCR (FLU A&B, COVID)
Influenza A by PCR: NEGATIVE
Influenza B by PCR: NEGATIVE
SARS Coronavirus 2 by RT PCR: NEGATIVE

## 2019-07-06 MED ORDER — GUAIFENESIN-CODEINE 100-10 MG/5ML PO SOLN: 10.0000 mL | Freq: Four times a day (QID) | ORAL | 0 refills | Status: DC | PRN

## 2019-07-06 MED ORDER — ALBUTEROL SULFATE HFA 108 (90 BASE) MCG/ACT IN AERS
3.0000 | INHALATION_SPRAY | Freq: Once | RESPIRATORY_TRACT | Status: AC
  Administered 2019-07-06: 3 via RESPIRATORY_TRACT
  Filled 2019-07-06: qty 6.7

## 2019-07-06 MED ORDER — BENZONATATE 100 MG PO CAPS
200.0000 mg | ORAL_CAPSULE | Freq: Once | ORAL | Status: AC
  Administered 2019-07-06: 200 mg via ORAL
  Filled 2019-07-06: qty 2

## 2019-07-06 MED ORDER — AEROCHAMBER Z-STAT PLUS/MEDIUM MISC
1.0000 | Freq: Once | Status: AC
  Administered 2019-07-06: 1

## 2019-07-06 MED ORDER — GUAIFENESIN-CODEINE 100-10 MG/5ML PO SOLN
10.0000 mL | Freq: Once | ORAL | Status: AC
  Administered 2019-07-06: 10 mL via ORAL
  Filled 2019-07-06: qty 10

## 2019-07-06 NOTE — Discharge Instructions (Signed)
Rest and make sure you are drinking plenty of fluids.  Continue using your COPD medications.  You have been prescribed a medication which should help control your cough but also help your cough be more productive.  This medication may make you drowsy as it contains codeine.  Do not drive within 4 hours of taking this medication.

## 2019-07-06 NOTE — ED Triage Notes (Signed)
Patient comes to the ED with non productive cough for 2 days.  Patient has history of COPD.  Patient does not report a fever.

## 2019-07-06 NOTE — ED Provider Notes (Signed)
[facility] EMERGENCY DEPARTMENT Provider Note   CSN: [id number] Arrival date & time: 07/06/19  1801     History Chief Complaint  Patient presents with  . Cough    Patricia Jackson is a 79 y.o. female with a history of atrial flutter, CAD, HTN and COPD presenting with a 2 day history of cough which has been wet sounding but nonproductive along with increasing sob and wheezing.  She denies chest pain or palpitations but endorses soreness of her anterior chest and abdominal wall with cough episodes only.  She has had no fevers, no exposure to Covid (currently has received the 1st Covid vaccine 3/12) denies n/v/d, no reduced taste or smell.  Her grandchildren have had recent uri type symptoms and pt reports close contact with them.  She uses albuterol both mdi and nebulizer, last dose this am with some relief of sob.  Also on Trelegy for her COPD.  Currently being evaluated by Dr. Delton Coombes for new thrombocytopenia and anemia.    The history is provided by the patient and a relative.       Past Medical History:  Diagnosis Date  . Atrial flutter ([place])    On Eliquis in 8/17 but discontinued after stent placement  . Breast cancer ([place])    remote  . CAD in native artery 03/16/2016  . COPD (chronic obstructive pulmonary disease) ([place])   . Hypertension   . S/P angioplasty with stent 03/15/16 DES Resolute, pLCX 03/16/2016    Patient Active Problem List   Diagnosis Date Noted  . Thrombocytopenia ([place]) 07/04/2019  . Orthostatic syncope 08/27/2017  . Atypical chest pain 08/27/2017  . Atrial flutter ([place]) 10/28/2016  . CKD (chronic kidney disease), stage IV ([place]) 10/28/2016  . COPD (chronic obstructive pulmonary disease) ([place]) 10/28/2016  . Depression 10/28/2016  . Esophageal dysphagia   . Anemia 04/07/2016  . Sinus bradycardia   . CAD in native artery 03/16/2016  . S/P angioplasty with stent 03/15/16 DES Resolute, [place] 03/16/2016  . NSTEMI (non-ST elevated myocardial infarction)  ([place]) 03/12/2016  . Shortness of breath   . 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[place]) 10/31/2015  . Dyslipidemia 10/31/2015  . Atrial flutter with rapid ventricular response ([place]) 10/30/2015  . Back pain 11/26/2013  . Arm pain 11/26/2013  . Chest pain 11/26/2013  . Hypertension     Past Surgical History:  Procedure Laterality Date  . APPENDECTOMY    . BREAST SURGERY Left   . CARDIAC CATHETERIZATION N/A 03/15/2016   Procedure: Left Heart Cath and Coronary Angiography;  Surgeon: Belva Crome, MD;  Location: [facility];  Service: Cardiovascular;  Laterality: N/A;  . CARDIAC CATHETERIZATION N/A 03/15/2016   Procedure: Coronary Stent Intervention;  Surgeon: Belva Crome, MD;  Location: [place] [facility];  Service: Cardiovascular;  Laterality: N/A;  . COLONOSCOPY     remote  . COLONOSCOPY N/A 05/26/2016   Procedure: COLONOSCOPY;  Surgeon: Daneil Dolin, MD;  [facility];  Service: Endoscopy;  Laterality: N/A;  845  . ESOPHAGOGASTRODUODENOSCOPY N/A 05/26/2016   Procedure: ESOPHAGOGASTRODUODENOSCOPY (EGD);  Surgeon: Daneil Dolin, MD;  [facility];  Service: Endoscopy;  Laterality: N/A;  Venia Minks DILATION N/A 05/26/2016   Procedure: Venia Minks DILATION;  Surgeon: Daneil Dolin, MD;  [facility];  Service: Endoscopy;  Laterality: N/A;     OB History    Gravida      Para      Term      Preterm  AB      Living  1     SAB      TAB      Ectopic      Multiple      Live Births              Family History  Problem Relation Age of Onset  . Heart disease Mother   . COPD Father   . Cancer Sister        unknown primary  . Cancer Brother        unknown primary  . Cancer Brother   . Hypertension Son   . Colon cancer Neg Hx     Social History   Tobacco Use  . Smoking status: Former Smoker    Packs/day: 1.00    Years: 56.00    Pack years: 56.00    Types: Cigarettes    Start date: 12/15/1965    Quit date: 10/29/2015    Years since  quitting: 3.6  . Smokeless tobacco: Never Used  Substance Use Topics  . Alcohol use: No    Alcohol/week: 0.0 standard drinks  . Drug use: No    Home Medications Prior to Admission medications   Medication Sig Start Date End Date Taking? Authorizing Provider  acetaminophen (TYLENOL) 500 MG tablet Take 1,500 mg by mouth every 4 (four) hours as needed.    [provider]  albuterol (PROVENTIL HFA;VENTOLIN HFA) 108 (90 Base) MCG/ACT inhaler Inhale 2 puffs into the lungs every 4 (four) hours as needed.    [provider]  buPROPion (WELLBUTRIN XL) 300 MG 24 hr tablet Take 300 mg by mouth daily.  01/21/17   [provider]  denosumab (PROLIA) 60 MG/ML SOSY injection Inject 60 mg into the skin every 6 (six) months.    [provider]  ELIQUIS 5 MG TABS tablet TAKE ONE TABLET (5MG TOTAL) BY MOUTH TWOTIMES DAILY 01/12/19   Arnoldo Lenis, MD  ferrous sulfate 325 (65 FE) MG tablet Take 325 mg by mouth daily with breakfast.  04/08/16   [provider]  Fluticasone-Umeclidin-Vilant (TRELEGY ELLIPTA) 100-62.5-25 MCG/INH AEPB Inhale 1 puff into the lungs daily. 07/29/17   Mannam, Hart Robinsons, MD  guaiFENesin-codeine 100-10 MG/5ML syrup Take 10 mLs by mouth every 6 (six) hours as needed for cough. 07/06/19   Evalee Jefferson, PA-C  levothyroxine (SYNTHROID) 50 MCG tablet Take 50 mcg by mouth daily before breakfast.    [provider]  metoprolol succinate (TOPROL-XL) 25 MG 24 hr tablet Take 50 mg by mouth daily.    [provider]  nitroGLYCERIN (NITROSTAT) 0.4 MG SL tablet PLACE ONE TABLET (0.4MG TOTAL) UNDER THETONGUE EVERY 5 MINUTES AS NEEDED Patient not taking: Reported on 07/03/2019 06/11/19   Arnoldo Lenis, MD  rosuvastatin (CRESTOR) 20 MG tablet Take 20 mg by mouth daily.  02/26/17   [provider]  sertraline (ZOLOFT) 25 MG tablet Take 25 mg by mouth daily.    [provider]    Allergies    Sulfa antibiotics  Review of  Systems   Review of Systems  Constitutional: Negative for chills and fever.  HENT: Negative for congestion and sore throat.   Eyes: Negative.   Respiratory: Positive for cough, shortness of breath and wheezing. Negative for chest tightness.   Cardiovascular: Negative for chest pain.  Gastrointestinal: Negative for abdominal pain, nausea and vomiting.  Genitourinary: Negative.   Musculoskeletal: Negative for arthralgias, joint swelling and neck pain.  Skin: Negative.  Negative  for rash and wound.  Neurological: Negative for dizziness, weakness, light-headedness, numbness and headaches.  Psychiatric/Behavioral: Negative.   All other systems reviewed and are negative.   Physical Exam Updated Vital Signs BP (!) 138/94   Pulse 62   Temp (!) 97.4 F (36.3 C) (Oral)   Resp 19   Ht 5' 7"  (1.702 m)   Wt 73 kg   SpO2 96%   BMI 25.21 kg/m   Physical Exam Constitutional:      Appearance: She is well-developed.  HENT:     Head: Normocephalic and atraumatic.     Nose: Mucosal edema present. No rhinorrhea.     Mouth/Throat:     Pharynx: Uvula midline. No oropharyngeal exudate or posterior oropharyngeal erythema.     Tonsils: No tonsillar abscesses.  Eyes:     Conjunctiva/sclera: Conjunctivae normal.  Cardiovascular:     Rate and Rhythm: Normal rate.     Heart sounds: Normal heart sounds.  Pulmonary:     Effort: Pulmonary effort is normal. No respiratory distress.     Breath sounds: No wheezing, rhonchi or rales.     Comments: No rhonchi appreciated on pulmonary exam.  No wheezing or prolonged expirations.  Sdoes havor e a rhonchorous sounding cough which is nonproductive. Abdominal:     Palpations: Abdomen is soft.     Tenderness: There is no abdominal tenderness.  Musculoskeletal:        General: Normal range of motion.     Right lower leg: No edema.     Left lower leg: No edema.  Skin:    General: Skin is warm and dry.     Findings: No rash.  Neurological:     Mental  Status: She is alert and oriented to person, place, and time.     ED Results / Procedures / Treatments   Labs (all labs ordered are listed, but only abnormal results are displayed) Labs Reviewed  CBC WITH DIFFERENTIAL/PLATELET - Abnormal; Notable for the following components:      Result Value   Platelets 112 (*)    All other components within normal limits  BASIC METABOLIC PANEL - Abnormal; Notable for the following components:   Chloride 114 (*)    Glucose, Bld 104 (*)    BUN 26 (*)    Creatinine, Ser 1.53 (*)    GFR calc non Af Amer 33 (*)    GFR calc Af Amer 38 (*)    All other components within normal limits  RESPIRATORY PANEL BY RT PCR (FLU A&B, COVID)    EKG EKG Interpretation  Date/Time:  Friday July 06 2019 18:48:38 EDT Ventricular Rate:  69 PR Interval:    QRS Duration: 93 QT Interval:  435 QTC Calculation: 466 R Axis:   83 Text Interpretation: Sinus rhythm Consider left atrial enlargement Borderline right axis deviation Confirmed by Milton Ferguson [phone]) on 07/06/2019 7:52:17 PM   Radiology DG Chest Portable 1 View  Result Date: 07/06/2019 CLINICAL DATA:  Cough and short of breath EXAM: PORTABLE CHEST 1 VIEW COMPARISON:  08/26/2017 FINDINGS: Hyperinflation with emphysematous disease. Mild diffuse bronchitic changes. No consolidation or effusion. Stable cardiomediastinal silhouette with aortic atherosclerosis. No pneumothorax. Atelectasis or scar at the left base. IMPRESSION: Emphysematous disease with bronchitic changes.  No focal pneumonia. Electronically Signed   By: Donavan Foil M.D.   On: 07/06/2019 18:46    Procedures Procedures (including critical care time)  Medications Ordered in ED Medications  benzonatate (TESSALON) capsule 200 mg (200 mg Oral  Given 07/06/19 1844)  albuterol (VENTOLIN HFA) 108 (90 Base) MCG/ACT inhaler 3 puff (3 puffs Inhalation Given 07/06/19 1843)  aerochamber Z-Stat Plus/medium 1 each (1 each Other Given 07/06/19 1843)   guaiFENesin-codeine 100-10 MG/5ML solution 10 mL (10 mLs Oral Given 07/06/19 2001)    ED Course  I have reviewed the triage vital signs and the nursing notes.  Pertinent labs & imaging results that were available during my care of the patient were reviewed by me and considered in my medical decision making (see chart for details).    MDM Rules/Calculators/A&P                      10:05 PM Pt recheck, still no wheezing, no sob and no hypoxia but persistent wet sounded cough despite tessalon.  Still nonproductive cough.  She was given guaifensin/codeine syrup for sx relief.  Labs, ekg, cxr reviewed.  She is Covid negative.  CXR - no pneumonia.  Exam and h/o suggestive of acute bronchitis.  She had no respiratory distress here, no hypoxia or wheeze.  She was placed prescribed additional guaifenesin/codeine, caution re sedation.  Advised to continue other home meds for her copd.  Afebrile, no wheezing, cxr clear - no indication for abx or prednisone at this time.    Pt was seen by Dr Roderic Palau prior to dc home.   Final Clinical Impression(s) / ED Diagnoses Final diagnoses:  Acute bronchitis, unspecified organism    Rx / DC Orders ED Discharge Orders         Ordered    guaiFENesin-codeine 100-10 MG/5ML syrup  Every 6 hours PRN     07/06/19 2031           Evalee Jefferson, Hershal Coria 07/06/19 2205    Milton Ferguson, MD 07/06/19 2229

## 2019-07-07 LAB — COPPER, SERUM: Copper: 121 ug/dL (ref 80–158)

## 2019-07-09 LAB — CRYOGLOBULIN

## 2019-07-10 ENCOUNTER — Encounter (INDEPENDENT_AMBULATORY_CARE_PROVIDER_SITE_OTHER): Payer: Medicare Other | Admitting: Ophthalmology

## 2019-07-11 LAB — METHYLMALONIC ACID, SERUM: Methylmalonic Acid, Quantitative: 137 nmol/L (ref 0–378)

## 2019-07-16 DIAGNOSIS — E7849 Other hyperlipidemia: Secondary | ICD-10-CM | POA: Diagnosis not present

## 2019-07-16 DIAGNOSIS — I129 Hypertensive chronic kidney disease with stage 1 through stage 4 chronic kidney disease, or unspecified chronic kidney disease: Secondary | ICD-10-CM | POA: Diagnosis not present

## 2019-07-16 DIAGNOSIS — N182 Chronic kidney disease, stage 2 (mild): Secondary | ICD-10-CM | POA: Diagnosis not present

## 2019-07-16 DIAGNOSIS — D509 Iron deficiency anemia, unspecified: Secondary | ICD-10-CM | POA: Diagnosis not present

## 2019-07-16 DIAGNOSIS — I482 Chronic atrial fibrillation, unspecified: Secondary | ICD-10-CM | POA: Diagnosis not present

## 2019-07-23 DIAGNOSIS — K047 Periapical abscess without sinus: Secondary | ICD-10-CM | POA: Diagnosis not present

## 2019-07-23 DIAGNOSIS — K029 Dental caries, unspecified: Secondary | ICD-10-CM | POA: Diagnosis not present

## 2019-07-23 DIAGNOSIS — H905 Unspecified sensorineural hearing loss: Secondary | ICD-10-CM | POA: Diagnosis not present

## 2019-07-26 ENCOUNTER — Other Ambulatory Visit: Payer: Self-pay

## 2019-07-26 ENCOUNTER — Ambulatory Visit (INDEPENDENT_AMBULATORY_CARE_PROVIDER_SITE_OTHER): Payer: Medicare Other | Admitting: Ophthalmology

## 2019-07-26 ENCOUNTER — Encounter (INDEPENDENT_AMBULATORY_CARE_PROVIDER_SITE_OTHER): Payer: Self-pay | Admitting: Ophthalmology

## 2019-07-26 DIAGNOSIS — H33052 Total retinal detachment, left eye: Secondary | ICD-10-CM

## 2019-07-26 DIAGNOSIS — H31401 Unspecified choroidal detachment, right eye: Secondary | ICD-10-CM | POA: Insufficient documentation

## 2019-07-26 DIAGNOSIS — H35022 Exudative retinopathy, left eye: Secondary | ICD-10-CM | POA: Insufficient documentation

## 2019-07-26 DIAGNOSIS — H3320 Serous retinal detachment, unspecified eye: Secondary | ICD-10-CM | POA: Insufficient documentation

## 2019-07-26 NOTE — Progress Notes (Signed)
07/26/2019     CHIEF COMPLAINT Patient presents for Retina Follow Up   HISTORY OF PRESENT ILLNESS: Patricia Jackson is a 79 y.o. female who presents to the clinic today for:   HPI    Retina Follow Up    Patient presents with  Other.  In both eyes.  Duration of 9 weeks.  Since onset it is stable.          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  Patient denies change in vision and overall has no complaints.        Last edited by Hurman Horn, MD on 07/26/2019  3:12 PM. (History)      Referring physician: Celene Squibb, MD [place],  [place] [id number]  HISTORICAL INFORMATION:   Selected notes from the MEDICAL RECORD NUMBER    Lab Results  Component Value Date   HGBA1C 5.6 10/30/2015     CURRENT MEDICATIONS: No current outpatient medications on file. (Ophthalmic Drugs)   No current facility-administered medications for this visit. (Ophthalmic Drugs)   Current Outpatient Medications (Other)  Medication Sig  . acetaminophen (TYLENOL) 500 MG tablet Take 1,500 mg by mouth every 4 (four) hours as needed.  Marland Kitchen albuterol (PROVENTIL HFA;VENTOLIN HFA) 108 (90 Base) MCG/ACT inhaler Inhale 2 puffs into the lungs every 4 (four) hours as needed.  Marland Kitchen buPROPion (WELLBUTRIN XL) 300 MG 24 hr tablet Take 300 mg by mouth daily.   Marland Kitchen denosumab (PROLIA) 60 MG/ML SOSY injection Inject 60 mg into the skin every 6 (six) months.  Marland Kitchen ELIQUIS 5 MG TABS tablet TAKE ONE TABLET (5MG TOTAL) BY MOUTH TWOTIMES DAILY  . ferrous sulfate 325 (65 FE) MG tablet Take 325 mg by mouth daily with breakfast.   . Fluticasone-Umeclidin-Vilant (TRELEGY ELLIPTA) 100-62.5-25 MCG/INH AEPB Inhale 1 puff into the lungs daily.  Marland Kitchen guaiFENesin-codeine 100-10 MG/5ML syrup Take 10 mLs by mouth every 6 (six) hours as needed for cough.  . levothyroxine  (SYNTHROID) 50 MCG tablet Take 50 mcg by mouth daily before breakfast.  . metoprolol succinate (TOPROL-XL) 25 MG 24 hr tablet Take 50 mg by mouth daily.  . nitroGLYCERIN (NITROSTAT) 0.4 MG SL tablet PLACE ONE TABLET (0.4MG TOTAL) UNDER THETONGUE EVERY 5 MINUTES AS NEEDED (Patient not taking: Reported on 07/03/2019)  . rosuvastatin (CRESTOR) 20 MG tablet Take 20 mg by mouth daily.   . sertraline (ZOLOFT) 25 MG tablet Take 25 mg by mouth daily.   No current facility-administered medications for this visit. (Other)      REVIEW OF SYSTEMS:    ALLERGIES Allergies  Allergen Reactions  . Sulfa Antibiotics Hives    PAST MEDICAL HISTORY Past Medical History:  Diagnosis Date  . Atrial flutter ([place])    On Eliquis in 8/17 but discontinued after stent placement  . Breast cancer ([place])    remote  . CAD in native artery 03/16/2016  . COPD (chronic obstructive pulmonary disease) ([place])   . Hypertension   . S/P angioplasty with stent 03/15/16 DES Resolute, [place] 03/16/2016   Past Surgical History:  Procedure Laterality Date  . APPENDECTOMY    . BREAST SURGERY Left   . CARDIAC CATHETERIZATION N/A 03/15/2016   Procedure: Left Heart Cath and Coronary Angiography;  Surgeon: Belva Crome, MD;  Location: [place] [place] [facility];  Service: Cardiovascular;  Laterality: N/A;  .  CARDIAC CATHETERIZATION N/A 03/15/2016   Procedure: Coronary Stent Intervention;  Surgeon: Belva Crome, MD;  Location: [facility];  Service: Cardiovascular;  Laterality: N/A;  . COLONOSCOPY     remote  . COLONOSCOPY N/A 05/26/2016   Procedure: COLONOSCOPY;  Surgeon: Daneil Dolin, MD;  [facility];  Service: Endoscopy;  Laterality: N/A;  845  . ESOPHAGOGASTRODUODENOSCOPY N/A 05/26/2016   Procedure: ESOPHAGOGASTRODUODENOSCOPY (EGD);  Surgeon: Daneil Dolin, MD;  [facility];  Service: Endoscopy;  Laterality: N/A;  Venia Minks DILATION N/A 05/26/2016   Procedure: Venia Minks DILATION;  Surgeon: Daneil Dolin, MD;  [facility];  Service: Endoscopy;  Laterality: N/A;    FAMILY HISTORY Family History  Problem Relation Age of Onset  . Heart disease Mother   . COPD Father   . Cancer Sister        unknown primary  . Cancer Brother        unknown primary  . Cancer Brother   . Hypertension Son   . Colon cancer Neg Hx     SOCIAL HISTORY Social History   Tobacco Use  . Smoking status: Former Smoker    Packs/day: 1.00    Years: 56.00    Pack years: 56.00    Types: Cigarettes    Start date: 12/15/1965    Quit date: 10/29/2015    Years since quitting: 3.7  . Smokeless tobacco: Never Used  Substance Use Topics  . Alcohol use: No    Alcohol/week: 0.0 standard drinks  . Drug use: No         OPHTHALMIC EXAM:  Base Eye Exam    Visual Acuity (Snellen - Linear)      Right Left   Dist cc 20/20-1 CF @ 8'   Dist ph cc  20/200+1   Correction: Glasses       Tonometry (Tonopen, 2:07 PM)      Right Left   Pressure 10 13       Pupils      Dark Light Shape React APD   Right 5 4 Round Brisk None   Left 6 6 Round Brisk +2       Visual Fields (Counting fingers)      Left Right     Full   Restrictions Partial outer superior temporal, inferior temporal, superior nasal deficiencies        Extraocular Movement      Right Left    Full Full       Neuro/Psych    Oriented x3: Yes   Mood/Affect: Normal       Dilation    Both eyes: 1.0% Mydriacyl, 2.5% Phenylephrine @ 2:07 PM        Slit Lamp and Fundus Exam    External Exam      Right Left   External Normal Normal       Slit Lamp Exam      Right Left   Lids/Lashes Normal Normal   Conjunctiva/Sclera White and quiet White and quiet   Cornea Clear Clear   Anterior Chamber Deep and quiet Deep and quiet   Iris Round and reactive Round and reactive   Lens Posterior chamber intraocular lens Posterior chamber intraocular lens   Anterior Vitreous Normal Normal       Fundus Exam      Right Left   Posterior  Vitreous Normal Normal,,, silicone oil clear   Disc Normal Normal   C/D Ratio 0.2  0.2   Macula Normal Normal   Vessels Normal Normal   Periphery Peripheral choroidal elevations, not hemorrhagic at this time,And stable over time, peripheral chorioretinal scars superonasal and inferiorly Peripheral chorioretinal scars of laser retinopexy as well as previous sites of hemorrhagic choroidopathy          IMAGING AND PROCEDURES  Imaging and Procedures for 07/26/19  Color Fundus Photography Optos - OU - Both Eyes       Right Eye Progression has improved. Disc findings include normal observations. Macula : normal observations. Periphery : RPE abnormality.   Left Eye Progression has been stable. Disc findings include normal observations. Macula : normal observations. Vessels : normal observations. Periphery : RPE abnormality.   Notes OD, stable,  OS peripheral chorioretinal scars through the clear silicone oil retina detached.  Will recommend laser retinopexy left eye around these areas prior to planned vitrectomy and oil removal                  ASSESSMENT/PLAN:  No problem-specific Assessment & Plan notes found for this encounter.      ICD-10-CM   1. Total retinal detachment of left eye  H33.052 Color Fundus Photography Optos - OU - Both Eyes  2. Choroidal detachment of right eye  H31.401 Color Fundus Photography Optos - OU - Both Eyes  3. Exudative retinopathy of left eye  H35.022 Color Fundus Photography Optos - OU - Both Eyes    1. OS peripheral chorioretinal scars through the clear silicone oil retina detached.  Will recommend laser retinopexy left eye around these areas prior to planned vitrectomy and oil removal  2.  3.  Ophthalmic Meds Ordered this visit:  No orders of the defined types were placed in this encounter.      Return in about 2 weeks (around 08/09/2019), or Laser retinopexy around chorioretinal scars through the oil, prior to surgery, for  RETINOPEXY.  There are no Patient Instructions on file for this visit.   Explained the diagnoses, plan, and follow up with the patient and they expressed understanding.  Patient expressed understanding of the importance of proper follow up care.   Clent Demark Irina Okelly M.D. Diseases & Surgery of the Retina and Vitreous [facility]ina [facility] Di[facility] Page 07/26/19     Abbreviations: M myopia (nearsighted); A astigmatism; H hyperopia (farsighted); P presbyopia; Mrx spectacle prescription;  CTL contact lenses; OD right eye; OS left eye; OU both eyes  XT exotropia; ET esotropia; PEK punctate epithelial keratitis; PEE punctate epithelial erosions; DES dry eye syndrome; MGD meibomian gland dysfunction; ATs artificial tears; PFAT's preservative free artificial tears; [place] nuclear sclerotic cataract; PSC posterior subcapsular cataract; ERM epi-retinal membrane; PVD posterior vitreous detachment; RD retinal detachment; DM diabetes mellitus; DR diabetic retinopathy; NPDR non-proliferative diabetic retinopathy; PDR proliferative diabetic retinopathy; CSME clinically significant macular edema; DME diabetic macular edema; dbh dot blot hemorrhages; CWS cotton wool spot; POAG primary open angle glaucoma; C/D cup-to-disc ratio; HVF humphrey visual field; GVF goldmann visual field; OCT optical coherence tomography; IOP intraocular pressure; BRVO Branch retinal vein occlusion; CRVO central retinal vein occlusion; CRAO central retinal artery occlusion; BRAO branch retinal artery occlusion; RT retinal tear; SB scleral buckle; PPV pars plana vitrectomy; VH Vitreous hemorrhage; PRP panretinal laser photocoagulation; IVK intravitreal kenalog; VMT vitreomacular traction; MH Macular hole;  NVD neovascularization of the disc; NVE neovascularization elsewhere; AREDS age related eye disease study; ARMD age related macular degeneration; POAG primary open angle glaucoma; EBMD epithelial/anterior basement membrane dystrophy; ACIOL anterior  chamber intraocular  lens; IOL intraocular lens; PCIOL posterior chamber intraocular lens; Phaco/IOL phacoemulsification with intraocular lens placement; [place] photorefractive keratectomy; LASIK laser assisted in situ keratomileusis; HTN hypertension; DM diabetes mellitus; COPD chronic obstructive pulmonary disease

## 2019-07-30 ENCOUNTER — Ambulatory Visit (HOSPITAL_COMMUNITY): Payer: Medicare Other | Admitting: Hematology

## 2019-07-31 ENCOUNTER — Ambulatory Visit (HOSPITAL_COMMUNITY): Payer: Medicare Other | Admitting: Hematology

## 2019-08-01 DIAGNOSIS — H353221 Exudative age-related macular degeneration, left eye, with active choroidal neovascularization: Secondary | ICD-10-CM | POA: Insufficient documentation

## 2019-08-01 DIAGNOSIS — H353211 Exudative age-related macular degeneration, right eye, with active choroidal neovascularization: Secondary | ICD-10-CM | POA: Insufficient documentation

## 2019-08-01 DIAGNOSIS — H3322 Serous retinal detachment, left eye: Secondary | ICD-10-CM | POA: Insufficient documentation

## 2019-08-01 DIAGNOSIS — H4312 Vitreous hemorrhage, left eye: Secondary | ICD-10-CM | POA: Insufficient documentation

## 2019-08-01 DIAGNOSIS — H353124 Nonexudative age-related macular degeneration, left eye, advanced atrophic with subfoveal involvement: Secondary | ICD-10-CM | POA: Insufficient documentation

## 2019-08-01 DIAGNOSIS — H3530 Unspecified macular degeneration: Secondary | ICD-10-CM | POA: Insufficient documentation

## 2019-08-01 DIAGNOSIS — H3562 Retinal hemorrhage, left eye: Secondary | ICD-10-CM | POA: Insufficient documentation

## 2019-08-01 HISTORY — DX: Vitreous hemorrhage, left eye: H43.12

## 2019-08-02 ENCOUNTER — Other Ambulatory Visit: Payer: Self-pay

## 2019-08-02 ENCOUNTER — Ambulatory Visit (INDEPENDENT_AMBULATORY_CARE_PROVIDER_SITE_OTHER): Payer: Medicare Other | Admitting: Ophthalmology

## 2019-08-02 ENCOUNTER — Encounter (INDEPENDENT_AMBULATORY_CARE_PROVIDER_SITE_OTHER): Payer: Self-pay | Admitting: Ophthalmology

## 2019-08-02 DIAGNOSIS — H353221 Exudative age-related macular degeneration, left eye, with active choroidal neovascularization: Secondary | ICD-10-CM

## 2019-08-02 DIAGNOSIS — H3342 Traction detachment of retina, left eye: Secondary | ICD-10-CM | POA: Diagnosis not present

## 2019-08-02 DIAGNOSIS — H3322 Serous retinal detachment, left eye: Secondary | ICD-10-CM

## 2019-08-02 DIAGNOSIS — H353124 Nonexudative age-related macular degeneration, left eye, advanced atrophic with subfoveal involvement: Secondary | ICD-10-CM

## 2019-08-02 DIAGNOSIS — H4312 Vitreous hemorrhage, left eye: Secondary | ICD-10-CM

## 2019-08-02 DIAGNOSIS — H3562 Retinal hemorrhage, left eye: Secondary | ICD-10-CM

## 2019-08-02 NOTE — Progress Notes (Addendum)
08/02/2019     CHIEF COMPLAINT Patient presents for Retina Follow Up   HISTORY OF PRESENT ILLNESS: Patricia Jackson is a 79 y.o. female who presents to the clinic today for:   HPI    Retina Follow Up    Patient presents with  Retinal Break/Detachment.  In left eye.  Severity is moderate.  Duration of 1 week.  Since onset it is stable.  I, the attending physician,  performed the HPI with the patient and updated documentation appropriately.          Comments    1 Week f\u OS. Retinal detachement OS. Retinopexy OS,,  Pt states no changes in vision since visit last week.       Last edited by Hurman Horn, MD on 08/02/2019  4:54 PM. (History)      Referring physician: Celene Squibb, MD [place],  [place] [address]  HISTORICAL INFORMATION:   Selected notes from the MEDICAL RECORD NUMBER    Lab Results  Component Value Date   HGBA1C 5.6 10/30/2015     CURRENT MEDICATIONS: No current outpatient medications on file. (Ophthalmic Drugs)   No current facility-administered medications for this visit. (Ophthalmic Drugs)   Current Outpatient Medications (Other)  Medication Sig  . acetaminophen (TYLENOL) 500 MG tablet Take 1,500 mg by mouth every 4 (four) hours as needed.  Marland Kitchen albuterol (PROVENTIL HFA;VENTOLIN HFA) 108 (90 Base) MCG/ACT inhaler Inhale 2 puffs into the lungs every 4 (four) hours as needed.  Marland Kitchen buPROPion (WELLBUTRIN XL) 300 MG 24 hr tablet Take 300 mg by mouth daily.   Marland Kitchen denosumab (PROLIA) 60 MG/ML SOSY injection Inject 60 mg into the skin every 6 (six) months.  Marland Kitchen ELIQUIS 5 MG TABS tablet TAKE ONE TABLET (5MG TOTAL) BY MOUTH TWOTIMES DAILY  . ferrous sulfate 325 (65 FE) MG tablet Take 325 mg by mouth daily with breakfast.   . Fluticasone-Umeclidin-Vilant (TRELEGY ELLIPTA) 100-62.5-25 MCG/INH AEPB Inhale 1 puff into the lungs daily.  Marland Kitchen guaiFENesin-codeine 100-10 MG/5ML syrup Take 10 mLs by mouth every 6 (six) hours as needed for cough.  . levothyroxine  (SYNTHROID) 50 MCG tablet Take 50 mcg by mouth daily before breakfast.  . metoprolol succinate (TOPROL-XL) 25 MG 24 hr tablet Take 50 mg by mouth daily.  . nitroGLYCERIN (NITROSTAT) 0.4 MG SL tablet PLACE ONE TABLET (0.4MG TOTAL) UNDER THETONGUE EVERY 5 MINUTES AS NEEDED (Patient not taking: Reported on 07/03/2019)  . rosuvastatin (CRESTOR) 20 MG tablet Take 20 mg by mouth daily.   . sertraline (ZOLOFT) 25 MG tablet Take 25 mg by mouth daily.   No current facility-administered medications for this visit. (Other)      REVIEW OF SYSTEMS:    ALLERGIES Allergies  Allergen Reactions  . Sulfa Antibiotics Hives    PAST MEDICAL HISTORY Past Medical History:  Diagnosis Date  . Atrial flutter ([facility])    On Eliquis in 8/17 but discontinued after stent placement  . Breast cancer ([facility])    remote  . CAD in native artery 03/16/2016  . COPD (chronic obstructive pulmonary disease) ([place])   . Hypertension   . S/P angioplasty with stent 03/15/16 DES Resolute, [place] 03/16/2016   Past Surgical History:  Procedure Laterality Date  . APPENDECTOMY    . BREAST SURGERY Left   . CARDIAC CATHETERIZATION N/A 03/15/2016   Procedure: Left Heart Cath and Coronary Angiography;  Surgeon: Belva Crome, MD;  Location: [facility];  Service: Cardiovascular;  Laterality: N/A;  . CARDIAC CATHETERIZATION N/A 03/15/2016   Procedure: Coronary Stent Intervention;  Surgeon: Belva Crome, MD;  Location: [facility];  Service: Cardiovascular;  Laterality: N/A;  . COLONOSCOPY     remote  . COLONOSCOPY N/A 05/26/2016   Procedure: COLONOSCOPY;  Surgeon: Daneil Dolin, MD;  [facility];  Service: Endoscopy;  Laterality: N/A;  845  . ESOPHAGOGASTRODUODENOSCOPY N/A 05/26/2016   Procedure: ESOPHAGOGASTRODUODENOSCOPY (EGD);  Surgeon: Daneil Dolin, MD;  [facility];  Service: Endoscopy;  Laterality: N/A;  Venia Minks DILATION N/A 05/26/2016   Procedure: Venia Minks DILATION;  Surgeon: Daneil Dolin, MD;  [facility];  Service: Endoscopy;  Laterality: N/A;    FAMILY HISTORY Family History  Problem Relation Age of Onset  . Heart disease Mother   . COPD Father   . Cancer Sister        unknown primary  . Cancer Brother        unknown primary  . Cancer Brother   . Hypertension Son   . Colon cancer Neg Hx     SOCIAL HISTORY Social History   Tobacco Use  . Smoking status: Former Smoker    Packs/day: 1.00    Years: 56.00    Pack years: 56.00    Types: Cigarettes    Start date: 12/15/1965    Quit date: 10/29/2015    Years since quitting: 3.7  . Smokeless tobacco: Never Used  Substance Use Topics  . Alcohol use: No    Alcohol/week: 0.0 standard drinks  . Drug use: No         OPHTHALMIC EXAM:  Base Eye Exam    Visual Acuity (Snellen - Linear)      Right Left   Dist [place] 20/25 -2 E Card @ 5'   Dist ph [place]  20/100 -1       Tonometry (Tonopen, 3:23 PM)      Right Left   Pressure 12 14       Pupils      Pupils Dark Light Shape React APD   Right PERRL 5 4 Round Brisk None   Left PERRL 5 4 Round Brisk +1       Visual Fields (Counting fingers)      Left Right     Full   Restrictions Total superior temporal, inferior temporal, superior nasal deficiencies        Neuro/Psych    Oriented x3: Yes   Mood/Affect: Normal        Slit Lamp and Fundus Exam    External Exam      Right Left   External Normal Normal       Slit Lamp Exam      Right Left   Lids/Lashes Normal Normal   Conjunctiva/Sclera White and quiet White and quiet   Cornea Clear Clear   Anterior Chamber Deep and quiet Deep and quiet   Iris Round and reactive Round and reactive   Lens Posterior chamber intraocular lens Posterior chamber intraocular lens   Anterior Vitreous Normal Normal       Fundus Exam      Right Left   Posterior Vitreous  Normal,,, silicone oil clear   Disc  Normal   C/D Ratio  0.2   Macula  Normal   Vessels  Normal   Periphery  Peripheral  chorioretinal scars of laser retinopexy as well as previous sites of hemorrhagic choroidopathy  IMAGING AND PROCEDURES  Imaging and Procedures for 08/03/19  Repair Retinal Detach, Photocoag - OS - Left Eye       Time Out Confirmed correct patient, procedure, site, and patient consented.   Anesthesia Topical anesthesia was used. Anesthetic medications included Proparacaine 0.5%.   Laser Information The type of laser was diode. Color was yellow. The duration in seconds was 0.02. The spot size was 390 microns. Laser power was 300. Total spots was 278.   Post-op The patient tolerated the procedure well. There were no complications. The patient received written and verbal post procedure care education.   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  Multiple sites of retinal penetration to drain the subretinal fluid in the past.  Today's laser retinopexy is to further secure these regions to minimize the risk of redetachment with upcoming planned vitrectomy and oil removal left eye      ICD-10-CM   1. Traction detachment of left retina  H33.42 Repair Retinal Detach, Photocoag - OS - Left Eye  2. Left retinal detachment  H33.22   3. Vitreous hemorrhage of left eye (HCC)  H43.12   4. Exudative age-related macular degeneration of left eye with active choroidal neovascularization ([place])  H35.3221   5. Retinal hemorrhage of left eye  H35.62   6. Advanced nonexudative age-related macular degeneration of left eye with subfoveal involvement  H35.3124     1.History of complex retinal detachment with hemorrhagic choroidopathy of the left  eye now repaired via vitrectomy drainage of subretinal fluid and injection silicone oil.  Multiple sites of retinal penetration to drain the subretinal fluid in the past.  Today's laser retinopexy is to further secure these regions to minimize the risk of redetachment with upcoming planned vitrectomy and oil removal left eye  2.  Patient return in approximately 2 months to plan and potentially preoperative visit for vitrectomy and silicone oil removal  3.  Ophthalmic Meds Ordered this visit:  No orders of the defined types were placed in this encounter.      Return in about 7 weeks (around 09/20/2019) for OS, dilate, COLOR FP,,,, possible preop for oral removal left eye.  There are no Patient Instructions on file for this visit.   Explained the diagnoses, plan, and follow up with the patient and they expressed understanding.  Patient expressed understanding of the importance of proper follow up care.   Clent Demark Devorah Givhan M.D. Diseases & Surgery of the Retina and Vitreous Retina & Diabetic [place] 08/03/19     Abbreviations: M myopia (nearsighted); A astigmatism; H hyperopia (farsighted); P presbyopia; Mrx spectacle prescription;  CTL contact lenses; OD right eye; OS left eye; OU both eyes  XT exotropia; ET esotropia; PEK punctate epithelial keratitis; PEE punctate epithelial erosions; DES dry eye syndrome; MGD meibomian gland dysfunction; ATs artificial tears; PFAT's preservative free artificial tears; [place] nuclear sclerotic cataract; PSC posterior subcapsular cataract; ERM epi-retinal membrane; PVD posterior vitreous detachment; RD retinal detachment; DM diabetes mellitus; DR diabetic retinopathy; NPDR non-proliferative diabetic retinopathy; PDR proliferative diabetic retinopathy; CSME clinically significant macular edema; DME diabetic macular edema; dbh dot blot hemorrhages; CWS cotton wool spot; POAG  primary open angle glaucoma; C/D cup-to-disc ratio; HVF humphrey visual field; GVF  goldmann visual field; OCT optical coherence tomography; IOP intraocular pressure; BRVO Branch retinal vein occlusion; CRVO central retinal vein occlusion; CRAO central retinal artery occlusion; BRAO branch retinal artery occlusion; RT retinal tear; SB scleral buckle; PPV pars plana vitrectomy; VH Vitreous hemorrhage; PRP panretinal laser photocoagulation; IVK intravitreal kenalog; VMT vitreomacular traction; MH Macular hole;  NVD neovascularization of the disc; NVE neovascularization elsewhere; AREDS age related eye disease study; ARMD age related macular degeneration; POAG primary open angle glaucoma; EBMD epithelial/anterior basement membrane dystrophy; ACIOL anterior chamber intraocular lens; IOL intraocular lens; PCIOL posterior chamber intraocular lens; Phaco/IOL phacoemulsification with intraocular lens placement; [place] photorefractive keratectomy; LASIK laser assisted in situ keratomileusis; HTN hypertension; DM diabetes mellitus; COPD chronic obstructive pulmonary disease

## 2019-08-03 DIAGNOSIS — H3342 Traction detachment of retina, left eye: Secondary | ICD-10-CM | POA: Insufficient documentation

## 2019-08-03 NOTE — Assessment & Plan Note (Signed)
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  Multiple sites of retinal penetration to drain the subretinal fluid in the past.  Today's laser retinopexy is to further secure these regions to minimize the risk of redetachment with upcoming planned vitrectomy and oil removal left eye

## 2019-08-17 ENCOUNTER — Other Ambulatory Visit: Payer: Self-pay | Admitting: Cardiology

## 2019-08-27 ENCOUNTER — Inpatient Hospital Stay (HOSPITAL_COMMUNITY): Payer: Medicare Other | Attending: Hematology | Admitting: Hematology

## 2019-08-27 ENCOUNTER — Encounter (HOSPITAL_COMMUNITY): Payer: Self-pay | Admitting: Hematology

## 2019-08-27 ENCOUNTER — Other Ambulatory Visit: Payer: Self-pay

## 2019-08-27 VITALS — BP 122/62 | HR 65 | Temp 97.8°F | Resp 17 | Wt 158.6 lb

## 2019-08-27 DIAGNOSIS — K5900 Constipation, unspecified: Secondary | ICD-10-CM | POA: Insufficient documentation

## 2019-08-27 DIAGNOSIS — D649 Anemia, unspecified: Secondary | ICD-10-CM | POA: Insufficient documentation

## 2019-08-27 DIAGNOSIS — R05 Cough: Secondary | ICD-10-CM | POA: Diagnosis not present

## 2019-08-27 DIAGNOSIS — R42 Dizziness and giddiness: Secondary | ICD-10-CM | POA: Diagnosis not present

## 2019-08-27 DIAGNOSIS — Z853 Personal history of malignant neoplasm of breast: Secondary | ICD-10-CM | POA: Insufficient documentation

## 2019-08-27 DIAGNOSIS — E559 Vitamin D deficiency, unspecified: Secondary | ICD-10-CM | POA: Insufficient documentation

## 2019-08-27 DIAGNOSIS — Z809 Family history of malignant neoplasm, unspecified: Secondary | ICD-10-CM | POA: Diagnosis not present

## 2019-08-27 DIAGNOSIS — D696 Thrombocytopenia, unspecified: Secondary | ICD-10-CM | POA: Insufficient documentation

## 2019-08-27 DIAGNOSIS — Z8249 Family history of ischemic heart disease and other diseases of the circulatory system: Secondary | ICD-10-CM | POA: Insufficient documentation

## 2019-08-27 DIAGNOSIS — I4891 Unspecified atrial fibrillation: Secondary | ICD-10-CM | POA: Insufficient documentation

## 2019-08-27 DIAGNOSIS — M79646 Pain in unspecified finger(s): Secondary | ICD-10-CM | POA: Diagnosis not present

## 2019-08-27 DIAGNOSIS — Z7901 Long term (current) use of anticoagulants: Secondary | ICD-10-CM | POA: Diagnosis not present

## 2019-08-27 DIAGNOSIS — Z87891 Personal history of nicotine dependence: Secondary | ICD-10-CM | POA: Insufficient documentation

## 2019-08-27 DIAGNOSIS — R0602 Shortness of breath: Secondary | ICD-10-CM | POA: Diagnosis not present

## 2019-08-27 DIAGNOSIS — Z836 Family history of other diseases of the respiratory system: Secondary | ICD-10-CM | POA: Diagnosis not present

## 2019-08-27 DIAGNOSIS — Z79899 Other long term (current) drug therapy: Secondary | ICD-10-CM | POA: Diagnosis not present

## 2019-08-27 DIAGNOSIS — Z882 Allergy status to sulfonamides status: Secondary | ICD-10-CM | POA: Insufficient documentation

## 2019-08-27 NOTE — Assessment & Plan Note (Addendum)
1.  Mild thrombocytopenia: -Seen at the request of Dr. Nevada Crane for platelet count of 62.  She has some easy bruising. -She is on Eliquis for atrial fibrillation. -We reviewed labs from 07/04/2019.  Platelet count was 127.  SPEP is negative.  B12, methylmalonic acid, copper and folic acid levels were normal.  LDH was normal.  White count and platelet count was normal. -ANA was positive with a dilution of 1 and 1280.  Rheumatoid factor is negative.  Hepatitis panel was negative. -She does not have any skin rashes or joint pains. -She does report her fingers turning white and become painful at least twice a week.  She also reports purplish discoloration of her toes. -Differential diagnosis for thrombocytopenia includes immune mediated process. -I will make a referral to Dr. Estanislado Pandy for further work-up and recommendations.  I plan to see her back in 6 months with repeat platelet count.  2.  Low vitamin D levels: -Adamantly was 27.  I have suggested her to take vitamin D 1000 units daily.  3.  Tobacco abuse: -Smoked 2 packs/day for 60 years. -CT chest lung cancer screening scan on 02/08/2020 was lung RADS 2.

## 2019-08-27 NOTE — Patient Instructions (Addendum)
[place] at [facility] Discharge Instructions  You were seen today by Dr. Delton Coombes. He went over your recent test results. 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 He will refer you to a Rheumatologist to monitor your Lupus blood work and symptoms. Your vitamen D level was low, he recommends that you start taking 1,000 units of Vit D3 daily. He will see you back in 6 months for labs and follow up.   Thank you for choosing [place] at [facility] to provide your oncology and hematology care.  To afford each patient quality time with our provider, please arrive at least 15 minutes before your scheduled appointment time.   If you have a lab appointment with the [facility] please come in thru the  Main Entrance and check in at the main information desk  You need to re-schedule your appointment should you arrive 10 or more minutes late.  We strive to give you quality time with our providers, and arriving late affects you and other patients whose appointments are after yours.  Also, if you no show three or more times for appointments you may be dismissed from the clinic at the providers discretion.     Again, thank you for choosing [facility].  Our hope is that these requests will decrease the amount of time that you wait before being seen by our physicians.       _____________________________________________________________  Should you have questions after your visit to [facility], please contact our office at [phone] between the hours of 8:00 a.m. and 4:30 p.m.  Voicemails left after 4:00 p.m. will not be returned until the following business day.  For prescription refill requests, have your pharmacy contact our office and allow 72 hours.    Cancer Center Support Programs:   > Cancer Support Group  2nd  Tuesday of the month 1pm-2pm, Journey Room

## 2019-08-27 NOTE — Progress Notes (Signed)
[place], [place] [address]   CLINIC:  Medical Oncology/Hematology  PCP:  Celene Squibb, MD [place] [address] [phone]   REASON FOR VISIT:  Follow-up for thrombocytopenia.    CURRENT THERAPY: Observation.    INTERVAL HISTORY:  Patricia Jackson 79 y.o. female seen for follow-up of thrombocytopenia and anemia.  Reports that her fingers change colors and hurt at least twice a week.  Also reports purplish discoloration of her toes.  No prior history of connective tissue disorders.  Appetite is 100%.  Energy levels are 50%.  Has some easy bruising on the upper extremities.  She is on Eliquis for atrial fibrillation.    REVIEW OF SYSTEMS:  Review of Systems  Respiratory: Positive for cough and shortness of breath.   Gastrointestinal: Positive for constipation.  Neurological: Positive for dizziness.  All other systems reviewed and are negative.    PAST MEDICAL/SURGICAL HISTORY:  Past Medical History:  Diagnosis Date  . Atrial flutter ([place])    On Eliquis in 8/17 but discontinued after stent placement  . Breast cancer ([place])    remote  . CAD in native artery 03/16/2016  . COPD (chronic obstructive pulmonary disease) ([place])   . Hypertension   . S/P angioplasty with stent 03/15/16 DES Resolute, [place] 03/16/2016   Past Surgical History:  Procedure Laterality Date  . APPENDECTOMY    . BREAST SURGERY Left   . CARDIAC CATHETERIZATION N/A 03/15/2016   Procedure: Left Heart Cath and Coronary Angiography;  Surgeon: Belva Crome, MD;  Location: [facility] CV LAB;  Service: Cardiovascular;  Laterality: N/A;  . CARDIAC CATHETERIZATION N/A 03/15/2016   Procedure: Coronary Stent Intervention;  Surgeon: Belva Crome, MD;  Location: [facility];  Service: Cardiovascular;  Laterality: N/A;  . COLONOSCOPY     remote  . COLONOSCOPY N/A 05/26/2016   Procedure: COLONOSCOPY;  Surgeon: Daneil Dolin, MD;  [facility];  Service: Endoscopy;  Laterality: N/A;  845  . ESOPHAGOGASTRODUODENOSCOPY N/A 05/26/2016   Procedure: ESOPHAGOGASTRODUODENOSCOPY (EGD);  Surgeon: Daneil Dolin, MD;  [facility];  Service: Endoscopy;  Laterality: N/A;  Venia Minks DILATION N/A 05/26/2016   Procedure: Venia Minks DILATION;  Surgeon: Daneil Dolin, MD;  [facility];  Service: Endoscopy;  Laterality: N/A;     SOCIAL HISTORY:  Social History   Socioeconomic History  . Marital status: Divorced    Spouse name: Not on file  . Number of children: 1  . Years of education: Not on file  . Highest education level: Not on file  Occupational History  . Occupation: Retired  Tobacco Use  . Smoking status: Former Smoker    Packs/day: 1.00    Years: 56.00    Pack years: 56.00    Types: Cigarettes    Start date: 12/15/1965    Quit date: 10/29/2015    Years since quitting: 3.8  . Smokeless tobacco: Never Used  Substance and Sexual Activity  . Alcohol use: No    Alcohol/week: 0.0 standard drinks  . Drug use: No  . Sexual activity: Not Currently  Other Topics Concern  . Not on file  Social History Narrative  . Not on file   Social Determinants of Health   Financial Resource Strain:   . Difficulty of Paying Living Expenses:   Food Insecurity:   . Worried About Charity fundraiser in the Last Year:   .  Ran Out of Food in the Last Year:   Transportation Needs:   . Film/video editor (Medical):   Marland Kitchen Lack of Transportation (Non-Medical):   Physical Activity:   . Days of Exercise per Week:   . Minutes of Exercise per Session:   Stress:   . Feeling of Stress :   Social Connections:   . Frequency of Communication with Friends and Family:   . Frequency of Social Gatherings with Friends and Family:   . Attends Religious Services:   . Active Member of Clubs or Organizations:   . Attends Archivist Meetings:   Marland Kitchen Marital Status:   Intimate Partner Violence:   . Fear of Current or  Ex-Partner:   . Emotionally Abused:   Marland Kitchen Physically Abused:   . Sexually Abused:     FAMILY HISTORY:  Family History  Problem Relation Age of Onset  . Heart disease Mother   . COPD Father   . Cancer Sister        unknown primary  . Cancer Brother        unknown primary  . Cancer Brother   . Hypertension Son   . Colon cancer Neg Hx     CURRENT MEDICATIONS:  Outpatient Encounter Medications as of 08/27/2019  Medication Sig  . buPROPion (WELLBUTRIN XL) 300 MG 24 hr tablet Take 300 mg by mouth daily.   Marland Kitchen denosumab (PROLIA) 60 MG/ML SOSY injection Inject 60 mg into the skin every 6 (six) months.  Marland Kitchen ELIQUIS 5 MG TABS tablet TAKE ONE TABLET (5MG TOTAL) BY MOUTH TWOTIMES DAILY  . ferrous sulfate 325 (65 FE) MG tablet Take 325 mg by mouth daily with breakfast.   . Fluticasone-Umeclidin-Vilant (TRELEGY ELLIPTA) 100-62.5-25 MCG/INH AEPB Inhale 1 puff into the lungs daily.  Marland Kitchen levothyroxine (SYNTHROID) 50 MCG tablet Take 50 mcg by mouth daily before breakfast.  . metoprolol succinate (TOPROL-XL) 25 MG 24 hr tablet Take 50 mg by mouth daily.  . rosuvastatin (CRESTOR) 20 MG tablet Take 20 mg by mouth daily.   . sertraline (ZOLOFT) 25 MG tablet Take 25 mg by mouth daily.  . [DISCONTINUED] guaiFENesin-codeine 100-10 MG/5ML syrup Take 10 mLs by mouth every 6 (six) hours as needed for cough.  Marland Kitchen acetaminophen (TYLENOL) 500 MG tablet Take 1,500 mg by mouth every 4 (four) hours as needed.  Marland Kitchen albuterol (PROVENTIL HFA;VENTOLIN HFA) 108 (90 Base) MCG/ACT inhaler Inhale 2 puffs into the lungs every 4 (four) hours as needed.  . nitroGLYCERIN (NITROSTAT) 0.4 MG SL tablet PLACE ONE TABLET (0.4MG TOTAL) UNDER THETONGUE EVERY 5 MINUTES AS NEEDED (Patient not taking: Reported on 08/27/2019)   No facility-administered encounter medications on file as of 08/27/2019.    ALLERGIES:  Allergies  Allergen Reactions  . Sulfa Antibiotics Hives     PHYSICAL EXAM:  ECOG Performance status: 1  Vitals:   08/27/19  1441  BP: 122/62  Pulse: 65  Resp: 17  Temp: 97.8 F (36.6 C)  SpO2: 98%   Filed Weights   08/27/19 1441  Weight: 158 lb 9.6 oz (71.9 kg)   Physical Exam Vitals reviewed.  Constitutional:      Appearance: Normal appearance.  HENT:     Head: Normocephalic.  Cardiovascular:     Rate and Rhythm: Normal rate and regular rhythm.  Pulmonary:     Effort: Pulmonary effort is normal.     Breath sounds: Normal breath sounds.  Musculoskeletal:        General: No swelling.  Skin:  Findings: No rash.  Neurological:     General: No focal deficit present.     Mental Status: She is alert and oriented to person, place, and time.  Psychiatric:        Mood and Affect: Mood normal.        Behavior: Behavior normal.      LABORATORY DATA:  I have reviewed the labs as listed.  CBC    Component Value Date/Time   WBC 7.3 07/06/2019 1858   RBC 4.05 07/06/2019 1858   HGB 12.4 07/06/2019 1858   HCT 39.7 07/06/2019 1858   PLT 112 (L) 07/06/2019 1858   MCV 98.0 07/06/2019 1858   MCH 30.6 07/06/2019 1858   MCHC 31.2 07/06/2019 1858   RDW 14.2 07/06/2019 1858   LYMPHSABS 1.2 07/06/2019 1858   MONOABS 0.8 07/06/2019 1858   EOSABS 0.2 07/06/2019 1858   BASOSABS 0.0 07/06/2019 1858   CMP Latest Ref Rng & Units 07/06/2019 07/04/2019 08/27/2017  Glucose 70 - 99 mg/dL 104(H) 94 95  BUN 8 - 23 mg/dL 26(H) 33(H) 30(H)  Creatinine 0.44 - 1.00 mg/dL 1.53(H) 1.84(H) 1.20(H)  Sodium 135 - 145 mmol/L 141 139 141  Potassium 3.5 - 5.1 mmol/L 4.5 4.4 4.7  Chloride 98 - 111 mmol/L 114(H) 109 110  CO2 22 - 32 mmol/L 22 20(L) 25  Calcium 8.9 - 10.3 mg/dL 9.1 8.9 9.1  Total Protein 6.5 - 8.1 g/dL - 6.8 -  Total Bilirubin 0.3 - 1.2 mg/dL - 0.5 -  Alkaline Phos 38 - 126 U/L - 66 -  AST 15 - 41 U/L - 40 -  ALT 0 - 44 U/L - 27 -    DIAGNOSTIC IMAGING:  I have independently reviewed the scans and discussed with the patient.  ASSESSMENT & PLAN:  Thrombocytopenia ([place]) 1.  Mild  thrombocytopenia: -Seen at the request of Dr. Nevada Crane for platelet count of 62.  She has some easy bruising. -She is on Eliquis for atrial fibrillation. -We reviewed labs from 07/04/2019.  Platelet count was 127.  SPEP is negative.  B12, methylmalonic acid, copper and folic acid levels were normal.  LDH was normal.  White count and platelet count was normal. -ANA was positive with a dilution of 1 and 1280.  Rheumatoid factor is negative.  Hepatitis panel was negative. -She does not have any skin rashes or joint pains. -She does report her fingers turning white and become painful at least twice a week.  She also reports purplish discoloration of her toes. -Differential diagnosis for thrombocytopenia includes immune mediated process. -I will make a referral to Dr. Estanislado Pandy for further work-up and recommendations.  I plan to see her back in 6 months with repeat platelet count.  2.  Low vitamin D levels: -Adamantly was 27.  I have suggested her to take vitamin D 1000 units daily.  3.  Tobacco abuse: -Smoked 2 packs/day for 60 years. -CT chest lung cancer screening scan on 02/08/2020 was lung RADS 2.     Orders placed this encounter:  Orders Placed This Encounter  Procedures  . CBC with Differential      Derek Jack, MD [place] [phone]

## 2019-09-04 ENCOUNTER — Emergency Department (HOSPITAL_COMMUNITY)
Admission: EM | Admit: 2019-09-04 | Discharge: 2019-09-04 | Disposition: A | Discharge status: Home / Self Care | Payer: Medicare Other | Attending: Emergency Medicine | Admitting: Emergency Medicine

## 2019-09-04 ENCOUNTER — Other Ambulatory Visit: Payer: Self-pay

## 2019-09-04 ENCOUNTER — Encounter (HOSPITAL_COMMUNITY): Payer: Self-pay | Admitting: Emergency Medicine

## 2019-09-04 DIAGNOSIS — I129 Hypertensive chronic kidney disease with stage 1 through stage 4 chronic kidney disease, or unspecified chronic kidney disease: Secondary | ICD-10-CM | POA: Insufficient documentation

## 2019-09-04 DIAGNOSIS — K047 Periapical abscess without sinus: Secondary | ICD-10-CM | POA: Insufficient documentation

## 2019-09-04 DIAGNOSIS — I2510 Atherosclerotic heart disease of native coronary artery without angina pectoris: Secondary | ICD-10-CM | POA: Insufficient documentation

## 2019-09-04 DIAGNOSIS — Z87891 Personal history of nicotine dependence: Secondary | ICD-10-CM | POA: Diagnosis not present

## 2019-09-04 DIAGNOSIS — Z23 Encounter for immunization: Secondary | ICD-10-CM | POA: Insufficient documentation

## 2019-09-04 DIAGNOSIS — N184 Chronic kidney disease, stage 4 (severe): Secondary | ICD-10-CM | POA: Insufficient documentation

## 2019-09-04 DIAGNOSIS — Z79899 Other long term (current) drug therapy: Secondary | ICD-10-CM | POA: Diagnosis not present

## 2019-09-04 DIAGNOSIS — K0889 Other specified disorders of teeth and supporting structures: Secondary | ICD-10-CM | POA: Diagnosis present

## 2019-09-04 MED ORDER — HYDROCODONE-ACETAMINOPHEN 5-325 MG PO TABS: 1.0000 | ORAL_TABLET | Freq: Four times a day (QID) | ORAL | 0 refills | Status: DC | PRN

## 2019-09-04 MED ORDER — AMOXICILLIN-POT CLAVULANATE 875-125 MG PO TABS: 1.0000 | ORAL_TABLET | Freq: Two times a day (BID) | ORAL | 0 refills | Status: DC

## 2019-09-04 MED ORDER — TETANUS-DIPHTH-ACELL PERTUSSIS 5-2.5-18.5 LF-MCG/0.5 IM SUSP
0.5000 mL | Freq: Once | INTRAMUSCULAR | Status: AC
  Administered 2019-09-04: 0.5 mL via INTRAMUSCULAR
  Filled 2019-09-04: qty 0.5

## 2019-09-04 NOTE — Discharge Instructions (Addendum)
Please take Tylenol (acetaminophen) to relieve your pain.  You may take tylenol, up to 1,000 mg (two extra strength pills) OR 3 normal strength pills every 8 hours.  Do not take more than 3,000 mg tylenol in a 24 hour period.  Please check all medication labels as many medications such as pain and cold medications may contain tylenol. Please do not drink alcohol while taking this medication.  Your prescription pain medication contains tylenol.  If you take it then do not take more than two pills of extra strength tylenol in 8 hours.   You may have diarrhea from the antibiotics.  It is very important that you continue to take the antibiotics even if you get diarrhea unless a medical professional tells you that you may stop taking them.  If you stop too early the bacteria you are being treated for will become stronger and you may need different, more powerful antibiotics that have more side effects and worsening diarrhea.  Please stay well hydrated and consider probiotics as they may decrease the severity of your diarrhea.    You are being prescribed a medication which may make you sleepy. For 24 hours after one dose please do not drive, operate heavy machinery, care for a small child with out another adult present, or perform any activities that may cause harm to you or someone else if you were to fall asleep or be impaired.   The prescription pain medication may make you feel off balance and more likely to fall.  It is very important that you are aware of this risk.

## 2019-09-04 NOTE — ED Triage Notes (Signed)
C/o dental pain right lower back teeth.  Rates pain 10/10.  Pt is scheduled to see dentist June 8th, here today d/t increasing pain and swelling.

## 2019-09-04 NOTE — ED Provider Notes (Signed)
[facility] EMERGENCY DEPARTMENT Provider Note   CSN: [id number] Arrival date & time: 09/04/19  1333     History Chief Complaint  Patient presents with  . Dental Pain    right lower    Patricia Jackson is a 79 y.o. female with a past medical history of COPD, CKD, a flutter, breast cancer, presents today for evaluation of dental pain.  She reports that about a month ago she had a similar dental pain in the right lower back teeth.  This was treated with amoxicillin which she finished about 2 weeks ago.  She reports that her pain and swelling had resolved.  She has an appointment to see a dentist on June 16.  She reports that over the past 3 days her pain has returned and worsened.  She reports swelling.  She denies any pain with opening her mouth, no difficulty swallowing or breathing.  No fevers.  She states the pain has kept her from sleeping the past 2 nights.  She is unsure when her last tetanus shot was, thinks it was over 10 years ago.  HPI     Past Medical History:  Diagnosis Date  . Atrial flutter ([place])    On Eliquis in 8/17 but discontinued after stent placement  . Breast cancer ([place])    remote  . CAD in native artery 03/16/2016  . COPD (chronic obstructive pulmonary disease) ([place])   . Hypertension   . S/P angioplasty with stent 03/15/16 DES Resolute, pLCX 03/16/2016    Patient Active Problem List   Diagnosis Date Noted  . Traction detachment of left retina 08/03/2019  . Left retinal detachment 08/01/2019  . Vitreous hemorrhage of left eye ([place]) 08/01/2019  . 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 Retinal hemorrhage of left eye 08/01/2019  . Advanced nonexudative age-related macular degeneration of left eye with subfoveal involvement 08/01/2019  . Total retinal detachment of left eye 07/26/2019  . 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  Thrombocytopenia ([place]) 07/04/2019  . Orthostatic syncope 08/27/2017  . Atypical chest pain 08/27/2017  . Atrial flutter ([place]) 10/28/2016  . CKD (chronic kidney disease), stage IV ([place]) 10/28/2016  . COPD (chronic obstructive pulmonary disease) ([place]) 10/28/2016  . Depression 10/28/2016  . Esophageal dysphagia   . Anemia 04/07/2016  . Sinus bradycardia   . CAD in native artery 03/16/2016  . S/P angioplasty with stent 03/15/16 DES Resolute, [place] 03/16/2016  . NSTEMI (non-ST elevated myocardial infarction) ([place]) 03/12/2016  . Shortness of breath   . 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[place]) 10/31/2015  . Dyslipidemia 10/31/2015  . Atrial flutter with rapid ventricular response ([place]) 10/30/2015  . Back pain 11/26/2013  . Arm pain 11/26/2013  . Chest pain 11/26/2013  . Hypertension     Past Surgical History:  Procedure Laterality Date  . APPENDECTOMY    . BREAST SURGERY Left   . CARDIAC CATHETERIZATION N/A 03/15/2016   Procedure: Left Heart Cath and Coronary Angiography;  Surgeon: Belva Crome, MD;  Location: [facility];  Service: Cardiovascular;  Laterality: N/A;  . CARDIAC CATHETERIZATION N/A 03/15/2016   Procedure: Coronary Stent Intervention;  Surgeon: Belva Crome, MD;  Location: [facility];  Service: Cardiovascular;  Laterality: N/A;  . COLONOSCOPY     remote  . COLONOSCOPY N/A 05/26/2016   Procedure: COLONOSCOPY;  Surgeon: Daneil Dolin, MD;  Location[facility];  Service:  Endoscopy;  Laterality: N/A;  845  . ESOPHAGOGASTRODUODENOSCOPY N/A 05/26/2016   Procedure: ESOPHAGOGASTRODUODENOSCOPY (EGD);  Surgeon: Daneil Dolin, MD;  Location: [facility];  Service: Endoscopy;  Laterality: N/A;  Venia Minks DILATION N/A 05/26/2016   Procedure: Venia Minks DILATION;  Surgeon: Daneil Dolin, MD;  Location: [facility];  Service: Endoscopy;  Laterality: N/A;     OB History    Gravida      Para      Term      Preterm      AB      Living  1     SAB      TAB       Ectopic      Multiple      Live Births              Family History  Problem Relation Age of Onset  . Heart disease Mother   . COPD Father   . Cancer Sister        unknown primary  . Cancer Brother        unknown primary  . Cancer Brother   . Hypertension Son   . Colon cancer Neg Hx     Social History   Tobacco Use  . Smoking status: Former Smoker    Packs/day: 1.00    Years: 56.00    Pack years: 56.00    Types: Cigarettes    Start date: 12/15/1965    Quit date: 10/29/2015    Years since quitting: 3.8  . Smokeless tobacco: Never Used  Substance Use Topics  . Alcohol use: No    Alcohol/week: 0.0 standard drinks  . Drug use: No    Home Medications Prior to Admission medications   Medication Sig Start Date End Date Taking? Authorizing Provider  acetaminophen (TYLENOL) 500 MG tablet Take 1,500 mg by mouth every 4 (four) hours as needed.    [provider]  albuterol (PROVENTIL HFA;VENTOLIN HFA) 108 (90 Base) MCG/ACT inhaler Inhale 2 puffs into the lungs every 4 (four) hours as needed.    [provider]  amoxicillin-clavulanate (AUGMENTIN) 875-125 MG tablet Take 1 tablet by mouth every 12 (twelve) hours. 09/04/19   Lorin Glass, PA-C  buPROPion (WELLBUTRIN XL) 300 MG 24 hr tablet Take 300 mg by mouth daily.  01/21/17   [provider]  denosumab (PROLIA) 60 MG/ML SOSY injection Inject 60 mg into the skin every 6 (six) months.    [provider]  ELIQUIS 5 MG TABS tablet TAKE ONE TABLET (5MG TOTAL) BY MOUTH TWOTIMES DAILY 08/17/19   Arnoldo Lenis, MD  ferrous sulfate 325 (65 FE) MG tablet Take 325 mg by mouth daily with breakfast.  04/08/16   [provider]  Fluticasone-Umeclidin-Vilant (TRELEGY ELLIPTA) 100-62.5-25 MCG/INH AEPB Inhale 1 puff into the lungs daily. 07/29/17   Mannam, Hart Robinsons, MD  HYDROcodone-acetaminophen (NORCO/VICODIN) 5-325 MG tablet Take 1 tablet by mouth every 6 (six) hours as needed for severe  pain. 09/04/19   Lorin Glass, PA-C  levothyroxine (SYNTHROID) 50 MCG tablet Take 50 mcg by mouth daily before breakfast.    [provider]  metoprolol succinate (TOPROL-XL) 25 MG 24 hr tablet Take 50 mg by mouth daily.    [provider]  nitroGLYCERIN (NITROSTAT) 0.4 MG SL tablet PLACE ONE TABLET (0.4MG TOTAL) UNDER THETONGUE EVERY 5 MINUTES AS NEEDED Patient not taking: Reported on 08/27/2019 06/11/19   Arnoldo Lenis, MD  rosuvastatin (CRESTOR) 20 MG  tablet Take 20 mg by mouth daily.  02/26/17   [provider]  sertraline (ZOLOFT) 25 MG tablet Take 25 mg by mouth daily.    [provider]    Allergies    Sulfa antibiotics  Review of Systems   Review of Systems  Constitutional: Negative for chills, fatigue and fever.  HENT: Positive for dental problem and facial swelling. Negative for drooling, sore throat, trouble swallowing and voice change.   Neurological: Negative for weakness and headaches.  All other systems reviewed and are negative.   Physical Exam Updated Vital Signs BP (!) 145/55   Pulse 60   Temp 98 F (36.7 C) (Oral)   Resp 16   Ht 5' 8"  (1.727 m)   Wt 72.1 kg   SpO2 98%   BMI 24.18 kg/m   Physical Exam Vitals and nursing note reviewed.  Constitutional:      General: She is not in acute distress.    Appearance: She is not ill-appearing.  HENT:     Head: Normocephalic.     Comments: Mild edema along the right border of the mandible.  There is no elevation of the floor of the mouth.  No significant submandibular swelling.  No trismus.  Uvula is midline.  Phonation is normal.  Teeth with broken tooth in the right mandible.  There is no intraoral fluid collection/swelling/abscess. Eyes:     Conjunctiva/sclera: Conjunctivae normal.  Cardiovascular:     Rate and Rhythm: Normal rate.  Pulmonary:     Effort: Pulmonary effort is normal. No respiratory distress.     Breath sounds: No stridor.  Musculoskeletal:      Cervical back: Normal range of motion and neck supple. No rigidity.  Neurological:     Mental Status: She is alert.  Psychiatric:        Mood and Affect: Mood normal.        Behavior: Behavior normal.     ED Results / Procedures / Treatments   Labs (all labs ordered are listed, but only abnormal results are displayed) Labs Reviewed - No data to display  EKG None  Radiology No results found.  Procedures Procedures (including critical care time)  Medications Ordered in ED Medications  Tdap (BOOSTRIX) injection 0.5 mL (0.5 mLs Intramuscular Given 09/04/19 1650)    ED Course  I have reviewed the triage vital signs and the nursing notes.  Pertinent labs & imaging results that were available during my care of the patient were reviewed by me and considered in my medical decision making (see chart for details).    MDM Rules/Calculators/A&P                     Patient is a 79 year old woman who presents today for evaluation of dental pain and infection.  She recently had a similar infection that resolved with amoxicillin.  She has a follow-up appointment with the dentist for definitive treatment however has had recurrence of the swelling mostly over the past 2 to 3 days.  On exam she has mild facial edema without evidence of spread of deep space infection.  No trismus, changes in phonation, uvular deviation, drooling and she is overall afebrile, not tachycardic or tachypneic and well-appearing.  Given that she was previously, within the past month, treated with amoxicillin will change to Augmentin for added coverage.  Given that she will most likely need dental extraction and does not have a recent tetanus shot I discussed options with her and she  wished for updating her tetanus.  She reports difficulty sleeping, short course of low-dose narcotic pain medicine is prescribed after [facility] PMP is reviewed.  We discussed safe use of narcotic medicine and she states her  understanding.  Return precautions were discussed with patient who states their understanding.  At the time of discharge patient denied any unaddressed complaints or concerns.  Patient is agreeable for discharge home.  Note: Portions of this report may have been transcribed using voice recognition software. Every effort was made to ensure accuracy; however, inadvertent computerized transcription errors may be present  Final Clinical Impression(s) / ED Diagnoses Final diagnoses:  Dental infection    Rx / DC Orders ED Discharge Orders         Ordered    HYDROcodone-acetaminophen (NORCO/VICODIN) 5-325 MG tablet  Every 6 hours PRN     09/04/19 1644    amoxicillin-clavulanate (AUGMENTIN) 875-125 MG tablet  Every 12 hours     09/04/19 1644           Lorin Glass, Hershal Coria 09/04/19 2308    Margette Fast, MD 09/05/19 1345

## 2019-09-19 DIAGNOSIS — I129 Hypertensive chronic kidney disease with stage 1 through stage 4 chronic kidney disease, or unspecified chronic kidney disease: Secondary | ICD-10-CM | POA: Diagnosis not present

## 2019-09-20 ENCOUNTER — Encounter (INDEPENDENT_AMBULATORY_CARE_PROVIDER_SITE_OTHER): Payer: Medicare Other | Admitting: Ophthalmology

## 2019-09-20 ENCOUNTER — Encounter (INDEPENDENT_AMBULATORY_CARE_PROVIDER_SITE_OTHER): Payer: Self-pay | Admitting: Ophthalmology

## 2019-09-20 ENCOUNTER — Other Ambulatory Visit: Payer: Self-pay

## 2019-09-20 ENCOUNTER — Ambulatory Visit (INDEPENDENT_AMBULATORY_CARE_PROVIDER_SITE_OTHER): Payer: Medicare Other | Admitting: Ophthalmology

## 2019-09-20 DIAGNOSIS — H3322 Serous retinal detachment, left eye: Secondary | ICD-10-CM | POA: Diagnosis not present

## 2019-09-20 DIAGNOSIS — H35022 Exudative retinopathy, left eye: Secondary | ICD-10-CM

## 2019-09-20 DIAGNOSIS — H33052 Total retinal detachment, left eye: Secondary | ICD-10-CM | POA: Diagnosis not present

## 2019-09-20 MED ORDER — PREDNISOLONE ACETATE 1 % OP SUSP: 1.0000 [drp] | Freq: Four times a day (QID) | OPHTHALMIC | 0 refills | Status: AC

## 2019-09-20 MED ORDER — OFLOXACIN 0.3 % OP SOLN: 1.0000 [drp] | Freq: Four times a day (QID) | OPHTHALMIC | 0 refills | Status: AC

## 2019-09-20 NOTE — Assessment & Plan Note (Addendum)
History of repair of complex retinal detachment related to subretinal hemorrhage from [facility].  Retina is stably attached underneath silicone oil and now requires vitrectomy and oil removal left eye to maximize visual potential  Patient understands the risk and benefit of vitrectomy and silicone oil removal the left eye including the 6 to 7% chance of redetachment.

## 2019-09-20 NOTE — Progress Notes (Signed)
09/20/2019     CHIEF COMPLAINT Patient presents for Retina Follow Up   HISTORY OF PRESENT ILLNESS: Patricia Jackson is a 79 y.o. female who presents to the clinic today for:   HPI    Retina Follow Up    Diagnosis: Traction Detachment.  In left eye.  Severity is severe.  Duration of 7 weeks.  Since onset it is stable.  I, the attending physician,  performed the HPI with the patient and updated documentation appropriately.          Comments    7 Week f\u OS. FP. 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 Denies any complaints.       Last edited by Hurman Horn, MD on 09/20/2019  4:19 PM. (History)      Referring physician: Celene Squibb, MD [place],  [place] [address]  HISTORICAL INFORMATION:   Selected notes from the MEDICAL RECORD NUMBER    Lab Results  Component Value Date   HGBA1C 5.6 10/30/2015     CURRENT MEDICATIONS: No current outpatient medications on file. (Ophthalmic Drugs)   No current facility-administered medications for this visit. (Ophthalmic Drugs)   Current Outpatient Medications (Other)  Medication Sig  . acetaminophen (TYLENOL) 500 MG tablet Take 1,500 mg by mouth every 4 (four) hours as needed.  Marland Kitchen albuterol (PROVENTIL HFA;VENTOLIN HFA) 108 (90 Base) MCG/ACT inhaler Inhale 2 puffs into the lungs every 4 (four) hours as needed.  Marland Kitchen amoxicillin-clavulanate (AUGMENTIN) 875-125 MG tablet Take 1 tablet by mouth every 12 (twelve) hours.  Marland Kitchen buPROPion (WELLBUTRIN XL) 300 MG 24 hr tablet Take 300 mg by mouth daily.   Marland Kitchen denosumab (PROLIA) 60 MG/ML SOSY injection Inject 60 mg into the skin every 6 (six) months.  Marland Kitchen ELIQUIS 5 MG TABS tablet TAKE ONE TABLET (5MG TOTAL) BY MOUTH TWOTIMES DAILY  . ferrous sulfate 325 (65 FE) MG tablet Take 325 mg by mouth  daily with breakfast.   . Fluticasone-Umeclidin-Vilant (TRELEGY ELLIPTA) 100-62.5-25 MCG/INH AEPB Inhale 1 puff into the lungs daily.  Marland Kitchen HYDROcodone-acetaminophen (NORCO/VICODIN) 5-325 MG tablet Take 1 tablet by mouth every 6 (six) hours as needed for severe pain.  Marland Kitchen levothyroxine (SYNTHROID) 50 MCG tablet Take 50 mcg by mouth daily before breakfast.  . metoprolol succinate (TOPROL-XL) 25 MG 24 hr tablet Take 50 mg by mouth daily.  . nitroGLYCERIN (NITROSTAT) 0.4 MG SL tablet PLACE ONE TABLET (0.4MG TOTAL) UNDER THETONGUE EVERY 5 MINUTES AS NEEDED (Patient not taking: Reported on 08/27/2019)  . rosuvastatin (CRESTOR) 20 MG tablet Take 20 mg by mouth daily.   . sertraline (ZOLOFT) 25 MG tablet Take 25 mg by mouth daily.   No current facility-administered medications for this visit. (Other)      REVIEW OF SYSTEMS:    ALLERGIES Allergies  Allergen Reactions  . Sulfa Antibiotics Hives    PAST MEDICAL HISTORY Past Medical History:  Diagnosis Date  . Atrial flutter ([place])    On Eliquis in 8/17 but discontinued after stent placement  . Breast cancer ([place])    remote  . CAD in native artery 03/16/2016  . COPD (chronic obstructive pulmonary disease) ([place])   . Hypertension   . S/P angioplasty with stent 03/15/16 DES Resolute, [place] 03/16/2016   Past Surgical History:  Procedure Laterality Date  .  APPENDECTOMY    . BREAST SURGERY Left   . CARDIAC CATHETERIZATION N/A 03/15/2016   Procedure: Left Heart Cath and Coronary Angiography;  Surgeon: Belva Crome, MD;  Location: [facility];  Service: Cardiovascular;  Laterality: N/A;  . CARDIAC CATHETERIZATION N/A 03/15/2016   Procedure: Coronary Stent Intervention;  Surgeon: Belva Crome, MD;  Location: [facility];  Service: Cardiovascular;  Laterality: N/A;  . COLONOSCOPY     remote  . COLONOSCOPY N/A 05/26/2016   Procedure: COLONOSCOPY;  Surgeon: Daneil Dolin, MD;  [facility];  Service: Endoscopy;  Laterality:  N/A;  845  . ESOPHAGOGASTRODUODENOSCOPY N/A 05/26/2016   Procedure: ESOPHAGOGASTRODUODENOSCOPY (EGD);  Surgeon: Daneil Dolin, MD;  [facility];  Service: Endoscopy;  Laterality: N/A;  Venia Minks DILATION N/A 05/26/2016   Procedure: Venia Minks DILATION;  Surgeon: Daneil Dolin, MD;  [facility];  Service: Endoscopy;  Laterality: N/A;    FAMILY HISTORY Family History  Problem Relation Age of Onset  . Heart disease Mother   . COPD Father   . Cancer Sister        unknown primary  . Cancer Brother        unknown primary  . Cancer Brother   . Hypertension Son   . Colon cancer Neg Hx     SOCIAL HISTORY Social History   Tobacco Use  . Smoking status: Former Smoker    Packs/day: 1.00    Years: 56.00    Pack years: 56.00    Types: Cigarettes    Start date: 12/15/1965    Quit date: 10/29/2015    Years since quitting: 3.8  . Smokeless tobacco: Never Used  Vaping Use  . Vaping Use: Never used  Substance Use Topics  . Alcohol use: No    Alcohol/week: 0.0 standard drinks  . Drug use: No         OPHTHALMIC EXAM:  Base Eye Exam    Visual Acuity (Snellen - Linear)      Right Left   Dist [place] 20/40 + E Card @ 5'   Dist ph [place] 20/20 -1 20/200       Tonometry (Tonopen, 3:38 PM)      Right Left   Pressure 10 13       Pupils      Pupils Dark Light Shape React APD   Right PERRL 4 3 Round Brisk None   Left PERRL 4 3 Round Brisk +1       Visual Fields (Counting fingers)      Left Right   Restrictions Partial outer superior temporal, inferior temporal, superior nasal deficiencies        Neuro/Psych    Oriented x3: Yes   Mood/Affect: Normal       Dilation    Left eye: 1.0% Mydriacyl, 2.5% Phenylephrine @ 3:38 PM        Slit Lamp and Fundus Exam    External Exam      Right Left   External Normal Normal       Slit Lamp Exam      Right Left   Lids/Lashes Normal Normal   Conjunctiva/Sclera White and quiet White and quiet   Cornea Clear Clear     Anterior Chamber Deep and quiet Deep and quiet   [place]ris Round and reactive Round and reactive   Lens Posterior chamber intraocular lens Posterior chamber intraocular lens   Anterior Vitreous Normal Normal  Fundus Exam      Right Left   Posterior Vitreous  Normal,,, silicone oil clear   Disc  Normal   C/D Ratio  0.25   Macula  Normal   Vessels  Normal   Periphery  Peripheral chorioretinal scars of laser retinopexy as well as previous sites of hemorrhagic choroidopathy          IMAGING AND PROCEDURES  Imaging and Procedures for 09/20/19  Color Fundus Photography Optos - OU - Both Eyes       Right Eye Progression has been stable. Disc findings include normal observations. Macula : normal observations. Vessels : normal observations. Periphery : normal observations.   Left Eye Progression has been stable. Disc findings include normal observations. Macula : normal observations. Vessels : normal observations.   Notes OS with peripheral retinal chorioretinal scarring, no active PECHR.  No retinal detachment                ASSESSMENT/PLAN:  Left retinal detachment History of repair of complex retinal detachment related to subretinal hemorrhage from [facility].  Retina is stably attached underneath silicone oil and now requires vitrectomy and oil removal left eye to maximize visual potential  Patient understands the risk and benefit of vitrectomy and silicone oil removal the left eye including the 6 to 7% chance of redetachment.      ICD-10-CM   1. Left retinal detachment  H33.22 Color Fundus Photography Optos - OU - Both Eyes  2. Exudative retinopathy of left eye  H35.022   3. Total retinal detachment of left eye  H33.052     1.  We will plan for surgical intervention left eye June 30 2. Pre op completed today. All patient questions answered.  3.  Ophthalmic Meds Ordered this visit:  No orders of the defined types were placed in this encounter.      Return  for Schedule vitrectomy, removal of silicone oil left eye, SCA local MAC.  There are no Patient Instructions on file for this visit.   Explained the diagnoses, plan, and follow up with the patient and they expressed understanding.  Patient expressed understanding of the importance of proper follow up care.   Clent Demark Letecia Arps M.D. Diseases & Surgery of the Retina and Vitreous Retina [facility] Di[facility] 09/20/19     Abbreviations: M myopia (nearsighted); A astigmatism; H hyperopia (farsighted); P presbyopia; Mrx spectacle prescription;  CTL contact lenses; OD right eye; OS left eye; OU both eyes  XT exotropia; ET esotropia; PEK punctate epithelial keratitis; PEE punctate epithelial erosions; DES dry eye syndrome; MGD meibomian gland dysfunction; ATs artificial tears; PFAT's preservative free artificial tears; [place] nuclear sclerotic cataract; PSC posterior subcapsular cataract; ERM epi-retinal membrane; PVD posterior vitreous detachment; RD retinal detachment; DM diabetes mellitus; DR diabetic retinopathy; NPDR non-proliferative diabetic retinopathy; PDR proliferative diabetic retinopathy; CSME clinically significant macular edema; DME diabetic macular edema; dbh dot blot hemorrhages; CWS cotton wool spot; POAG primary open angle glaucoma; C/D cup-to-disc ratio; HVF humphrey visual field; GVF goldmann visual field; OCT optical coherence tomography; IOP intraocular pressure; BRVO Branch retinal vein occlusion; CRVO central retinal vein occlusion; CRAO central retinal artery occlusion; BRAO branch retinal artery occlusion; RT retinal tear; SB scleral buckle; PPV pars plana vitrectomy; VH Vitreous hemorrhage; PRP panretinal laser photocoagulation; IVK intravitreal kenalog; VMT vitreomacular traction; MH Macular hole;  NVD neovascularization of the disc; NVE neovascularization elsewhere; AREDS age related eye disease study; ARMD age related macular degeneration; POAG primary open angle glaucoma; EBMD  epithelial/anterior basement membrane dystrophy; ACIOL anterior chamber intraocular lens; IOL intraocular lens; PCIOL posterior chamber intraocular lens; Phaco/IOL phacoemulsification with intraocular lens placement; [place] photorefractive keratectomy; LASIK laser assisted in situ keratomileusis; HTN hypertension; DM diabetes mellitus; COPD chronic obstructive pulmonary disease

## 2019-09-26 DIAGNOSIS — I129 Hypertensive chronic kidney disease with stage 1 through stage 4 chronic kidney disease, or unspecified chronic kidney disease: Secondary | ICD-10-CM | POA: Diagnosis not present

## 2019-10-02 DIAGNOSIS — I129 Hypertensive chronic kidney disease with stage 1 through stage 4 chronic kidney disease, or unspecified chronic kidney disease: Secondary | ICD-10-CM | POA: Diagnosis not present

## 2019-10-03 ENCOUNTER — Encounter (INDEPENDENT_AMBULATORY_CARE_PROVIDER_SITE_OTHER): Payer: Medicare Other | Admitting: Ophthalmology

## 2019-10-03 DIAGNOSIS — H33052 Total retinal detachment, left eye: Secondary | ICD-10-CM

## 2019-10-03 DIAGNOSIS — Z8669 Personal history of other diseases of the nervous system and sense organs: Secondary | ICD-10-CM | POA: Diagnosis not present

## 2019-10-03 DIAGNOSIS — Z483 Aftercare following surgery for neoplasm: Secondary | ICD-10-CM | POA: Diagnosis not present

## 2019-10-03 DIAGNOSIS — H33022 Retinal detachment with multiple breaks, left eye: Secondary | ICD-10-CM | POA: Diagnosis not present

## 2019-10-03 DIAGNOSIS — Z481 Encounter for planned postprocedural wound closure: Secondary | ICD-10-CM | POA: Diagnosis not present

## 2019-10-03 HISTORY — PX: VITRECTOMY: SHX106

## 2019-10-04 ENCOUNTER — Ambulatory Visit (INDEPENDENT_AMBULATORY_CARE_PROVIDER_SITE_OTHER): Payer: Medicare Other | Admitting: Ophthalmology

## 2019-10-04 ENCOUNTER — Encounter (INDEPENDENT_AMBULATORY_CARE_PROVIDER_SITE_OTHER): Payer: Self-pay | Admitting: Ophthalmology

## 2019-10-04 ENCOUNTER — Other Ambulatory Visit: Payer: Self-pay

## 2019-10-04 DIAGNOSIS — E7849 Other hyperlipidemia: Secondary | ICD-10-CM | POA: Diagnosis not present

## 2019-10-04 DIAGNOSIS — I482 Chronic atrial fibrillation, unspecified: Secondary | ICD-10-CM | POA: Diagnosis not present

## 2019-10-04 DIAGNOSIS — N182 Chronic kidney disease, stage 2 (mild): Secondary | ICD-10-CM | POA: Diagnosis not present

## 2019-10-04 DIAGNOSIS — D509 Iron deficiency anemia, unspecified: Secondary | ICD-10-CM | POA: Diagnosis not present

## 2019-10-04 DIAGNOSIS — I129 Hypertensive chronic kidney disease with stage 1 through stage 4 chronic kidney disease, or unspecified chronic kidney disease: Secondary | ICD-10-CM | POA: Diagnosis not present

## 2019-10-04 DIAGNOSIS — H33052 Total retinal detachment, left eye: Secondary | ICD-10-CM

## 2019-10-04 DIAGNOSIS — H35022 Exudative retinopathy, left eye: Secondary | ICD-10-CM

## 2019-10-04 NOTE — Progress Notes (Signed)
10/04/2019     CHIEF COMPLAINT Patient presents for Post-op Follow-up   HISTORY OF PRESENT ILLNESS: Patricia Jackson is a 79 y.o. female who presents to the clinic today for:   HPI    Post-op Follow-up    In left eye.  Discomfort includes pain.  Vision is stable.  I, the attending physician,  performed the HPI with the patient and updated documentation appropriately.          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        Last edited by Hurman Horn, MD on 10/04/2019  9:40 AM. (History)      Referring physician: Celene Squibb, MD [place],  [place] [address]  HISTORICAL INFORMATION:   Selected notes from the MEDICAL RECORD NUMBER    Lab Results  Component Value Date   HGBA1C 5.6 10/30/2015     CURRENT MEDICATIONS: Current Outpatient Medications (Ophthalmic Drugs)  Medication Sig  . ofloxacin (OCUFLOX) 0.3 % ophthalmic solution Place 1 drop into the left eye 4 (four) times daily for 21 days.  . prednisoLONE acetate (PRED FORTE) 1 % ophthalmic suspension Place 1 drop into the left eye 4 (four) times daily for 21 days.   No current facility-administered medications for this visit. (Ophthalmic Drugs)   Current Outpatient Medications (Other)  Medication Sig  . acetaminophen (TYLENOL) 500 MG tablet Take 1,500 mg by mouth every 4 (four) hours as needed.  Marland Kitchen albuterol (PROVENTIL HFA;VENTOLIN HFA) 108 (90 Base) MCG/ACT inhaler Inhale 2 puffs into the lungs every 4 (four) hours as needed.  Marland Kitchen amoxicillin-clavulanate (AUGMENTIN) 875-125 MG tablet Take 1 tablet by mouth every 12 (twelve) hours.  Marland Kitchen buPROPion (WELLBUTRIN XL) 300 MG 24 hr tablet Take 300 mg by mouth daily.   Marland Kitchen denosumab (PROLIA) 60 MG/ML SOSY injection Inject 60 mg into the skin every 6 (six) months.  Marland Kitchen ELIQUIS 5 MG TABS tablet TAKE ONE TABLET (5MG TOTAL) BY MOUTH TWOTIMES DAILY  .  ferrous sulfate 325 (65 FE) MG tablet Take 325 mg by mouth daily with breakfast.   . Fluticasone-Umeclidin-Vilant (TRELEGY ELLIPTA) 100-62.5-25 MCG/INH AEPB Inhale 1 puff into the lungs daily.  Marland Kitchen HYDROcodone-acetaminophen (NORCO/VICODIN) 5-325 MG tablet Take 1 tablet by mouth every 6 (six) hours as needed for severe pain.  Marland Kitchen levothyroxine (SYNTHROID) 50 MCG tablet Take 50 mcg by mouth daily before breakfast.  . metoprolol succinate (TOPROL-XL) 25 MG 24 hr tablet Take 50 mg by mouth daily.  . nitroGLYCERIN (NITROSTAT) 0.4 MG SL tablet PLACE ONE TABLET (0.4MG TOTAL) UNDER THETONGUE EVERY 5 MINUTES AS NEEDED (Patient not taking: Reported on 08/27/2019)  . rosuvastatin (CRESTOR) 20 MG tablet Take 20 mg by mouth daily.   . sertraline (ZOLOFT) 25 MG tablet Take 25 mg by mouth daily.   No current facility-administered medications for this visit. (Other)      REVIEW OF SYSTEMS:    ALLERGIES Allergies  Allergen Reactions  . Sulfa Antibiotics Hives    PAST MEDICAL HISTORY Past Medical History:  Diagnosis Date  . Atrial flutter ([place])    On Eliquis in 8/17 but discontinued after stent placement  . Breast cancer ([place])    remote  . CAD in native artery 03/16/2016  . COPD (chronic obstructive pulmonary disease) ([place])   . Hypertension   . S/P angioplasty with stent 03/15/16  DES [place], [place] 03/16/2016   Past Surgical History:  Procedure Laterality Date  . APPENDECTOMY    . BREAST SURGERY Left   . CARDIAC CATHETERIZATION N/A 03/15/2016   Procedure: Left Heart Cath and Coronary Angiography;  Surgeon: Belva Crome, MD;  Location: [place] [facility];  Service: Cardiovascular;  Laterality: N/A;  . CARDIAC CATHETERIZATION N/A 03/15/2016   Procedure: Coronary Stent Intervention;  Surgeon: Belva Crome, MD;  Location: [place] [facility];  Service: Cardiovascular;  Laterality: N/A;  . COLONOSCOPY     remote  . COLONOSCOPY N/A 05/26/2016   Procedure: COLONOSCOPY;  Surgeon: Daneil Dolin, MD;   [facility];  Service: Endoscopy;  Laterality: N/A;  845  . ESOPHAGOGASTRODUODENOSCOPY N/A 05/26/2016   Procedure: ESOPHAGOGASTRODUODENOSCOPY (EGD);  Surgeon: Daneil Dolin, MD;  [facility];  Service: Endoscopy;  Laterality: N/A;  Venia Minks DILATION N/A 05/26/2016   Procedure: Venia Minks DILATION;  Surgeon: Daneil Dolin, MD;  [facility];  Service: Endoscopy;  Laterality: N/A;    FAMILY HISTORY Family History  Problem Relation Age of Onset  . Heart disease Mother   . COPD Father   . Cancer Sister        unknown primary  . Cancer Brother        unknown primary  . Cancer Brother   . Hypertension Son   . Colon cancer Neg Hx     SOCIAL HISTORY Social History   Tobacco Use  . Smoking status: Former Smoker    Packs/day: 1.00    Years: 56.00    Pack years: 56.00    Types: Cigarettes    Start date: 12/15/1965    Quit date: 10/29/2015    Years since quitting: 3.9  . Smokeless tobacco: Never Used  Vaping Use  . Vaping Use: Never used  Substance Use Topics  . Alcohol use: No    Alcohol/week: 0.0 standard drinks  . Drug use: No         OPHTHALMIC EXAM:  Base Eye Exam    Visual Acuity (Snellen - Linear)      Right Left   Dist [place] 20/40 E Card @ 6'   Dist ph [place] 20/25 -1 20/80       Tonometry (Tonopen, 8:50 AM)      Right Left   Pressure 10 8       Pupils      Dark Light Shape React   Right 4 3 Round Brisk   Left 6 6 Round Dilated       Neuro/Psych    Oriented x3: Yes   Mood/Affect: Normal        Slit Lamp and Fundus Exam    External Exam      Right Left   External Normal Normal       Slit Lamp Exam      Right Left   Lids/Lashes Normal Normal   Conjunctiva/Sclera White and quiet White and quiet   Cornea Clear Clear   Anterior Chamber Deep and quiet Deep and quiet   Iris Round and reactive Round and reactive   Lens Posterior chamber intraocular lens Posterior chamber intraocular lens   Anterior Vitreous Normal Normal        Fundus Exam      Right Left   Posterior Vitreous  Clear, vitrectomized, no oil   Disc  Normal   C/D Ratio  0.25   Macula  Normal   Vessels  Normal  Periphery  Peripheral chorioretinal scars of laser retinopexy as well as previous sites of hemorrhagic choroidopathy,, no hemorrhage, good laser photocoagulation, retina attached.          IMAGING AND PROCEDURES  Imaging and Procedures for 10/04/19           ASSESSMENT/PLAN:  Exudative retinopathy of left eye This condition now involutional and quiescent with no active choroidal neovascular membrane nor sites of  PECHR      ICD-10-CM   1. 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  OS looks great currently.  Retina attached, visual acuity improved  2.  3.  Ophthalmic Meds Ordered this visit:  No orders of the defined types were placed in this encounter.      Return in about 1 week (around 10/11/2019) for OS, dilate, COLOR FP.  Patient Instructions  Ofloxacin  4 times daily to the operative eye  Prednisolone acetate 1 drop to the operative eye 4 times daily  Patient instructed not to refill the medications and use them for maximum of 3 weeks.  Patient instructed do not rub the eye.  Patient has the option to use the patch at night.    Explained the diagnoses, plan, and follow up with the patient and they expressed understanding.  Patient expressed understanding of the importance of proper follow up care.   Clent Demark Lyrik Buresh M.D. Diseases & Surgery of the Retina and Vitreous Retina & Diabetic [place] 10/04/19     Abbreviations: M myopia (nearsighted); A astigmatism; H hyperopia (farsighted); P presbyopia; Mrx spectacle prescription;  CTL contact lenses; OD right eye; OS left eye; OU both eyes  XT exotropia; ET esotropia; PEK punctate epithelial keratitis; PEE punctate epithelial erosions; DES dry eye syndrome; MGD meibomian gland  dysfunction; ATs artificial tears; PFAT's preservative free artificial tears; [place] nuclear sclerotic cataract; PSC posterior subcapsular cataract; ERM epi-retinal membrane; PVD posterior vitreous detachment; RD retinal detachment; DM diabetes mellitus; DR diabetic retinopathy; NPDR non-proliferative diabetic retinopathy; PDR proliferative diabetic retinopathy; CSME clinically significant macular edema; DME diabetic macular edema; dbh dot blot hemorrhages; CWS cotton wool spot; POAG primary open angle glaucoma; C/D cup-to-disc ratio; HVF humphrey visual field; GVF goldmann visual field; OCT optical coherence tomography; IOP intraocular pressure; BRVO Branch retinal vein occlusion; CRVO central retinal vein occlusion; CRAO central retinal artery occlusion; BRAO branch retinal artery occlusion; RT retinal tear; SB scleral buckle; PPV pars plana vitrectomy; VH Vitreous hemorrhage; PRP panretinal laser photocoagulation; IVK intravitreal kenalog; VMT vitreomacular traction; MH Macular hole;  NVD neovascularization of the disc; NVE neovascularization elsewhere; AREDS age related eye disease study; ARMD age related macular degeneration; POAG primary open angle glaucoma; EBMD epithelial/anterior basement membrane dystrophy; ACIOL anterior chamber intraocular lens; IOL intraocular lens; PCIOL posterior chamber intraocular lens; Phaco/IOL phacoemulsification with intraocular lens placement; [place] photorefractive keratectomy; LASIK laser assisted in situ keratomileusis; HTN hypertension; DM diabetes mellitus; COPD chronic obstructive pulmonary disease

## 2019-10-04 NOTE — Patient Instructions (Addendum)
Ofloxacin  4 times daily to the operative eye  Prednisolone acetate 1 drop to the operative eye 4 times daily  Patient instructed not to refill the medications and use them for maximum of 3 weeks.  Patient instructed do not rub the eye.  Patient has the option to use the patch at night.

## 2019-10-04 NOTE — Assessment & Plan Note (Signed)
This condition now involutional and quiescent with no active choroidal neovascular membrane nor sites of  [facility]

## 2019-10-09 DIAGNOSIS — I129 Hypertensive chronic kidney disease with stage 1 through stage 4 chronic kidney disease, or unspecified chronic kidney disease: Secondary | ICD-10-CM | POA: Diagnosis not present

## 2019-10-11 ENCOUNTER — Encounter (INDEPENDENT_AMBULATORY_CARE_PROVIDER_SITE_OTHER): Payer: Self-pay | Admitting: Ophthalmology

## 2019-10-11 ENCOUNTER — Other Ambulatory Visit: Payer: Self-pay

## 2019-10-11 ENCOUNTER — Ambulatory Visit (INDEPENDENT_AMBULATORY_CARE_PROVIDER_SITE_OTHER): Payer: Medicare Other | Admitting: Ophthalmology

## 2019-10-11 DIAGNOSIS — H33052 Total retinal detachment, left eye: Secondary | ICD-10-CM | POA: Diagnosis not present

## 2019-10-11 DIAGNOSIS — H35021 Exudative retinopathy, right eye: Secondary | ICD-10-CM

## 2019-10-11 NOTE — Progress Notes (Signed)
10/11/2019     CHIEF COMPLAINT Patient presents for Post-op Follow-up   HISTORY OF PRESENT ILLNESS: Patricia Jackson is a 79 y.o. female who presents to the clinic today for:   HPI    Post-op Follow-up    In left eye.  Discomfort includes pain and floaters.  Vision is stable.          Comments    1 Week s\p Vitrectomy Focal Laser, and removal of silicone oil, OS  Pt states she is still having pain in outside corner of OS. Pt states she has been seeing multiple floaters in OS since last week. Pt states they come and go. Using gtts as directed.       Last edited by Tilda Franco on 10/11/2019  8:48 AM. (History)      Referring physician: Celene Squibb, MD [place],  [place] [address]  HISTORICAL INFORMATION:   Selected notes from the MEDICAL RECORD NUMBER    Lab Results  Component Value Date   HGBA1C 5.6 10/30/2015     CURRENT MEDICATIONS: Current Outpatient Medications (Ophthalmic Drugs)  Medication Sig  . ofloxacin (OCUFLOX) 0.3 % ophthalmic solution Place 1 drop into the left eye 4 (four) times daily for 21 days.  . prednisoLONE acetate (PRED FORTE) 1 % ophthalmic suspension Place 1 drop into the left eye 4 (four) times daily for 21 days.   No current facility-administered medications for this visit. (Ophthalmic Drugs)   Current Outpatient Medications (Other)  Medication Sig  . acetaminophen (TYLENOL) 500 MG tablet Take 1,500 mg by mouth every 4 (four) hours as needed.  Marland Kitchen albuterol (PROVENTIL HFA;VENTOLIN HFA) 108 (90 Base) MCG/ACT inhaler Inhale 2 puffs into the lungs every 4 (four) hours as needed.  Marland Kitchen amoxicillin-clavulanate (AUGMENTIN) 875-125 MG tablet Take 1 tablet by mouth every 12 (twelve) hours.  Marland Kitchen buPROPion (WELLBUTRIN XL) 300 MG 24 hr tablet Take 300 mg by mouth daily.   Marland Kitchen denosumab (PROLIA) 60 MG/ML SOSY injection Inject 60 mg into the skin every 6 (six) months.  Marland Kitchen ELIQUIS 5 MG TABS tablet TAKE ONE TABLET (5MG TOTAL) BY MOUTH TWOTIMES  DAILY  . ferrous sulfate 325 (65 FE) MG tablet Take 325 mg by mouth daily with breakfast.   . Fluticasone-Umeclidin-Vilant (TRELEGY ELLIPTA) 100-62.5-25 MCG/INH AEPB Inhale 1 puff into the lungs daily.  Marland Kitchen HYDROcodone-acetaminophen (NORCO/VICODIN) 5-325 MG tablet Take 1 tablet by mouth every 6 (six) hours as needed for severe pain.  Marland Kitchen levothyroxine (SYNTHROID) 50 MCG tablet Take 50 mcg by mouth daily before breakfast.  . metoprolol succinate (TOPROL-XL) 25 MG 24 hr tablet Take 50 mg by mouth daily.  . nitroGLYCERIN (NITROSTAT) 0.4 MG SL tablet PLACE ONE TABLET (0.4MG TOTAL) UNDER THETONGUE EVERY 5 MINUTES AS NEEDED (Patient not taking: Reported on 08/27/2019)  . rosuvastatin (CRESTOR) 20 MG tablet Take 20 mg by mouth daily.   . sertraline (ZOLOFT) 25 MG tablet Take 25 mg by mouth daily.   No current facility-administered medications for this visit. (Other)      REVIEW OF SYSTEMS:    ALLERGIES Allergies  Allergen Reactions  . Sulfa Antibiotics Hives    PAST MEDICAL HISTORY Past Medical History:  Diagnosis Date  . Atrial flutter ([place])    On Eliquis in 8/17 but discontinued after stent placement  . Breast cancer ([place])    remote  . CAD in native artery 03/16/2016  . COPD (chronic obstructive pulmonary disease) ([place])   . Hypertension   .  S/P angioplasty with stent 03/15/16 DES [place], [place] 03/16/2016   Past Surgical History:  Procedure Laterality Date  . APPENDECTOMY    . BREAST SURGERY Left   . CARDIAC CATHETERIZATION N/A 03/15/2016   Procedure: Left Heart Cath and Coronary Angiography;  Surgeon: Belva Crome, MD;  Location: [place] CV LAB;  Service: Cardiovascular;  Laterality: N/A;  . CARDIAC CATHETERIZATION N/A 03/15/2016   Procedure: Coronary Stent Intervention;  Surgeon: Belva Crome, MD;  Location: [place] CV LAB;  Service: Cardiovascular;  Laterality: N/A;  . COLONOSCOPY     remote  . COLONOSCOPY N/A 05/26/2016   Procedure: COLONOSCOPY;  Surgeon: Daneil Dolin, MD;  [facility];  Service: Endoscopy;  Laterality: N/A;  845  . ESOPHAGOGASTRODUODENOSCOPY N/A 05/26/2016   Procedure: ESOPHAGOGASTRODUODENOSCOPY (EGD);  Surgeon: Daneil Dolin, MD;  [facility];  Service: Endoscopy;  Laterality: N/A;  Venia Minks DILATION N/A 05/26/2016   Procedure: Venia Minks DILATION;  Surgeon: Daneil Dolin, MD;  [facility];  Service: Endoscopy;  Laterality: N/A;  . VITRECTOMY Left 10/03/2019   Dr. Zadie Rhine, Vitrectomy, Focal Laser, Removal of Silicone Oil    FAMILY HISTORY Family History  Problem Relation Age of Onset  . Heart disease Mother   . COPD Father   . Cancer Sister        unknown primary  . Cancer Brother        unknown primary  . Cancer Brother   . Hypertension Son   . Colon cancer Neg Hx     SOCIAL HISTORY Social History   Tobacco Use  . Smoking status: Former Smoker    Packs/day: 1.00    Years: 56.00    Pack years: 56.00    Types: Cigarettes    Start date: 12/15/1965    Quit date: 10/29/2015    Years since quitting: 3.9  . Smokeless tobacco: Never Used  Vaping Use  . Vaping Use: Never used  Substance Use Topics  . Alcohol use: No    Alcohol/week: 0.0 standard drinks  . Drug use: No         OPHTHALMIC EXAM:  Base Eye Exam    Visual Acuity (Snellen - Linear)      Right Left   Dist [place] 20/30 -2 20/200   Dist ph [place]  20/100       Tonometry (Tonopen, 8:50 AM)      Right Left   Pressure 12 15       Pupils      Pupils Dark Light Shape React APD   Right PERRL 4 3 Round Brisk None   Left PERRL 4 4 Round Minimal +1       Visual Fields (Counting fingers)      Left Right     Full   Restrictions Partial outer superior temporal, inferior temporal, superior nasal, inferior nasal deficiencies        Neuro/Psych    Oriented x3: Yes   Mood/Affect: Normal       Dilation    Left eye: 1.0% Mydriacyl, 2.5% Phenylephrine @ 8:50 AM        Slit Lamp and Fundus Exam    External Exam       Right Left   External Normal Normal       Slit Lamp Exam      Right Left   Lids/Lashes Normal Normal   Conjunctiva/Sclera White and quiet White and quiet   Cornea Clear Clear  Anterior Chamber Deep and quiet Deep and quiet   Iris Round and reactive Round and reactive   Lens Posterior chamber intraocular lens Posterior chamber intraocular lens   Anterior Vitreous Normal Normal       Fundus Exam      Right Left   Posterior Vitreous  Clear, vitrectomized, no oil   Disc  Normal   C/D Ratio  0.25   Macula  Normal   Vessels  Normal   Periphery  Peripheral chorioretinal scars of laser retinopexy as well as previous sites of hemorrhagic choroidopathy,, no hemorrhage, good laser photocoagulation, retina attached.          IMAGING AND PROCEDURES  Imaging and Procedures for 10/11/19  Color Fundus Photography Optos - OU - Both Eyes       Right Eye Progression has worsened. Disc findings include normal observations. Macula : normal observations. Vessels : normal observations. Periphery : exudates, hemorrhage, degeneration.   Left Eye Progression has improved. Disc findings include normal observations. Macula : normal observations. Vessels : normal observations. Periphery : degeneration.   Notes OS, 1 week status post vitrectomy, silicone oil removal after successful reattachment of hemorrhagic retinal detachment from [facility].     OD, undilated examination yet funduscopically , Brand-new evidence of peripheral retinal exudates temporally with subretinal hemorrhage temporally also suggestive of PECHR                ASSESSMENT/PLAN:  No problem-specific Assessment & Plan notes found for this encounter.      ICD-10-CM   1. 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 Exudative retinopathy of right eye  H35.021     1.  Left eye, continue ofloxacin 1 drop 4 times daily as  well as prednisolone acetate 1 drop 4 times daily, for 2 more weeks  2.  Right eye, return visit next week for laser peripheral fraction of lesion, exudative retinopathy with laser demarcation  3.  Ophthalmic Meds Ordered this visit:  No orders of the defined types were placed in this encounter.      Return for OD, dilate, PRP, COLOR FP.  There are no Patient Instructions on file for this visit.   Explained the diagnoses, plan, and follow up with the patient and they expressed understanding.  Patient expressed understanding of the importance of proper follow up care.   Clent Demark Jackqueline Aquilar M.D. Diseases & Surgery of the Retina and Vitreous Retina & Diabetic [place] 10/11/19     Abbreviations: M myopia (nearsighted); A astigmatism; H hyperopia (farsighted); P presbyopia; Mrx spectacle prescription;  CTL contact lenses; OD right eye; OS left eye; OU both eyes  XT exotropia; ET esotropia; PEK punctate epithelial keratitis; PEE punctate epithelial erosions; DES dry eye syndrome; MGD meibomian gland dysfunction; ATs artificial tears; PFAT's preservative free artificial tears; [place] nuclear sclerotic cataract; PSC posterior subcapsular cataract; ERM epi-retinal membrane; PVD posterior vitreous detachment; RD retinal detachment; DM diabetes mellitus; DR diabetic retinopathy; NPDR non-proliferative diabetic retinopathy; PDR proliferative diabetic retinopathy; CSME clinically significant macular edema; DME diabetic macular edema; dbh dot blot hemorrhages; CWS cotton wool spot; POAG primary open angle glaucoma; C/D cup-to-disc ratio; HVF humphrey visual field; GVF goldmann visual field; OCT optical coherence tomography; IOP intraocular pressure; BRVO Branch retinal vein occlusion; CRVO central retinal vein occlusion; CRAO central retinal artery occlusion; BRAO branch retinal artery occlusion; RT retinal tear; SB  scleral buckle; PPV pars plana vitrectomy; VH Vitreous hemorrhage; PRP panretinal laser  photocoagulation; IVK intravitreal kenalog; VMT vitreomacular traction; MH Macular hole;  NVD neovascularization of the disc; NVE neovascularization elsewhere; AREDS age related eye disease study; ARMD age related macular degeneration; POAG primary open angle glaucoma; EBMD epithelial/anterior basement membrane dystrophy; ACIOL anterior chamber intraocular lens; IOL intraocular lens; PCIOL posterior chamber intraocular lens; Phaco/IOL phacoemulsification with intraocular lens placement; [place] photorefractive keratectomy; LASIK laser assisted in situ keratomileusis; HTN hypertension; DM diabetes mellitus; COPD chronic obstructive pulmonary disease

## 2019-10-16 DIAGNOSIS — I129 Hypertensive chronic kidney disease with stage 1 through stage 4 chronic kidney disease, or unspecified chronic kidney disease: Secondary | ICD-10-CM | POA: Diagnosis not present

## 2019-10-18 ENCOUNTER — Ambulatory Visit (INDEPENDENT_AMBULATORY_CARE_PROVIDER_SITE_OTHER): Payer: Medicare Other | Admitting: Ophthalmology

## 2019-10-18 ENCOUNTER — Encounter (INDEPENDENT_AMBULATORY_CARE_PROVIDER_SITE_OTHER): Payer: Self-pay | Admitting: Ophthalmology

## 2019-10-18 ENCOUNTER — Other Ambulatory Visit: Payer: Self-pay

## 2019-10-18 DIAGNOSIS — H35021 Exudative retinopathy, right eye: Secondary | ICD-10-CM | POA: Diagnosis not present

## 2019-10-18 NOTE — Progress Notes (Signed)
10/18/2019     CHIEF COMPLAINT Patient presents for Retina Follow Up   HISTORY OF PRESENT ILLNESS: Patricia Jackson is a 79 y.o. female who presents to the clinic today for:   HPI    Retina Follow Up    Patient presents with  Other.  In right eye.  Duration of 1 week.  Since onset it is stable.          Comments    1 week follow up - FP OU, PRP OD to wall off, ablate and surround peripheral PECHR  Patient denies change in vision and overall has no complaints.        Last edited by Hurman Horn, MD on 10/18/2019  9:29 AM. (History)      Referring physician: Celene Squibb, MD [place],  [place] [address]  HISTORICAL INFORMATION:   Selected notes from the MEDICAL RECORD NUMBER    Lab Results  Component Value Date   HGBA1C 5.6 10/30/2015     CURRENT MEDICATIONS: No current outpatient medications on file. (Ophthalmic Drugs)   No current facility-administered medications for this visit. (Ophthalmic Drugs)   Current Outpatient Medications (Other)  Medication Sig  . acetaminophen (TYLENOL) 500 MG tablet Take 1,500 mg by mouth every 4 (four) hours as needed.  Marland Kitchen albuterol (PROVENTIL HFA;VENTOLIN HFA) 108 (90 Base) MCG/ACT inhaler Inhale 2 puffs into the lungs every 4 (four) hours as needed.  Marland Kitchen amoxicillin-clavulanate (AUGMENTIN) 875-125 MG tablet Take 1 tablet by mouth every 12 (twelve) hours.  Marland Kitchen buPROPion (WELLBUTRIN XL) 300 MG 24 hr tablet Take 300 mg by mouth daily.   Marland Kitchen denosumab (PROLIA) 60 MG/ML SOSY injection Inject 60 mg into the skin every 6 (six) months.  Marland Kitchen ELIQUIS 5 MG TABS tablet TAKE ONE TABLET (5MG TOTAL) BY MOUTH TWOTIMES DAILY  . ferrous sulfate 325 (65 FE) MG tablet Take 325 mg by mouth daily with breakfast.   . Fluticasone-Umeclidin-Vilant (TRELEGY ELLIPTA) 100-62.5-25 MCG/INH AEPB Inhale 1 puff into the lungs daily.  Marland Kitchen HYDROcodone-acetaminophen (NORCO/VICODIN) 5-325 MG tablet Take 1 tablet by mouth every 6 (six) hours as needed for severe  pain.  Marland Kitchen levothyroxine (SYNTHROID) 50 MCG tablet Take 50 mcg by mouth daily before breakfast.  . metoprolol succinate (TOPROL-XL) 25 MG 24 hr tablet Take 50 mg by mouth daily.  . nitroGLYCERIN (NITROSTAT) 0.4 MG SL tablet PLACE ONE TABLET (0.4MG TOTAL) UNDER THETONGUE EVERY 5 MINUTES AS NEEDED (Patient not taking: Reported on 08/27/2019)  . rosuvastatin (CRESTOR) 20 MG tablet Take 20 mg by mouth daily.   . sertraline (ZOLOFT) 25 MG tablet Take 25 mg by mouth daily.   No current facility-administered medications for this visit. (Other)      REVIEW OF SYSTEMS:    ALLERGIES Allergies  Allergen Reactions  . Sulfa Antibiotics Hives    PAST MEDICAL HISTORY Past Medical History:  Diagnosis Date  . Atrial flutter ([place])    On Eliquis in 8/17 but discontinued after stent placement  . Breast cancer ([place])    remote  . CAD in native artery 03/16/2016  . COPD (chronic obstructive pulmonary disease) ([place])   . Hypertension   . S/P angioplasty with stent 03/15/16 [facility] [place], [place] 03/16/2016   Past Surgical History:  Procedure Laterality Date  . APPENDECTOMY    . BREAST SURGERY Left   . CARDIAC CATHETERIZATION N/A 03/15/2016   Procedure: Left Heart Cath and Coronary Angiography;  Surgeon: Belva Crome, MD;  Location: [facility]  CV LAB;  Service: Cardiovascular;  Laterality: N/A;  . CARDIAC CATHETERIZATION N/A 03/15/2016   Procedure: Coronary Stent Intervention;  Surgeon: Belva Crome, MD;  Location: [facility];  Service: Cardiovascular;  Laterality: N/A;  . COLONOSCOPY     remote  . COLONOSCOPY N/A 05/26/2016   Procedure: COLONOSCOPY;  Surgeon: Daneil Dolin, MD;  [facility];  Service: Endoscopy;  Laterality: N/A;  845  . ESOPHAGOGASTRODUODENOSCOPY N/A 05/26/2016   Procedure: ESOPHAGOGASTRODUODENOSCOPY (EGD);  Surgeon: Daneil Dolin, MD;  [facility];  Service: Endoscopy;  Laterality: N/A;  Venia Minks DILATION N/A 05/26/2016   Procedure: Venia Minks  DILATION;  Surgeon: Daneil Dolin, MD;  [facility];  Service: Endoscopy;  Laterality: N/A;  . VITRECTOMY Left 10/03/2019   Dr. Zadie Rhine, Vitrectomy, Focal Laser, Removal of Silicone Oil    FAMILY HISTORY Family History  Problem Relation Age of Onset  . Heart disease Mother   . COPD Father   . Cancer Sister        unknown primary  . Cancer Brother        unknown primary  . Cancer Brother   . Hypertension Son   . Colon cancer Neg Hx     SOCIAL HISTORY Social History   Tobacco Use  . Smoking status: Former Smoker    Packs/day: 1.00    Years: 56.00    Pack years: 56.00    Types: Cigarettes    Start date: 12/15/1965    Quit date: 10/29/2015    Years since quitting: 3.9  . Smokeless tobacco: Never Used  Vaping Use  . Vaping Use: Never used  Substance Use Topics  . Alcohol use: No    Alcohol/week: 0.0 standard drinks  . Drug use: No         OPHTHALMIC EXAM:  Base Eye Exam    Visual Acuity (Snellen - Linear)      Right Left   Dist [place] 20/30 20/70+1   Dist ph [place]  20/60       Tonometry (Tonopen, 8:55 AM)      Right Left   Pressure 9 10       Pupils      Pupils Dark Light Shape React APD   Right PERRL 4 3 Round Brisk None   Left PERRL 4 4 Round Minimal +1       Visual Fields (Counting fingers)      Left Right     Full   Restrictions Partial outer superior temporal, inferior temporal, superior nasal, inferior nasal deficiencies        Extraocular Movement      Right Left    Full Full       Neuro/Psych    Oriented x3: Yes   Mood/Affect: Normal       Dilation    Right eye: 1.0% Mydriacyl, 2.5% Phenylephrine @ 8:55 AM        Slit Lamp and Fundus Exam    External Exam      Right Left   External Normal Normal       Slit Lamp Exam      Right Left   Lids/Lashes Normal Normal   Conjunctiva/Sclera White and quiet White and quiet   Cornea Clear Clear   Anterior Chamber Deep and quiet Deep and quiet   Iris Round and reactive Round and  reactive   Lens Posterior chamber intraocular lens Posterior chamber intraocular lens   Vitreous Normal Normal  IMAGING AND PROCEDURES  Imaging and Procedures for 10/18/19  Color Fundus Photography Optos - OU - Both Eyes       Right Eye Progression has been stable. Disc findings include normal observations. Macula : normal observations. Vessels : normal observations. Periphery : degeneration.   Left Eye Periphery : degeneration.   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   Anesthesia Topical anesthesia was used. Anesthetic medications included Proparacaine 0.5%.   Laser Information The type of laser was diode. Color was yellow. The duration in seconds was 0.03. The spot size was 390 microns. Laser power was 260. Total spots was 498.   Post-op The patient tolerated the procedure well. There were no complications. The patient received written and verbal post procedure care education.   Notes Peripheral hemorrhagic chorioretinopathy temporally and superotemporally were treated in a border barricade type fashion from the 9:00 to the 11:30 position.                ASSESSMENT/PLAN:  No problem-specific Assessment & Plan notes found for this encounter.      ICD-10-CM   1. Exudative retinopathy of right eye  H35.021 Color Fundus Photography Optos - OU - Both Eyes    Destroy Choroid Lesion, Photocoag - OD - Right Eye    1.  2.  3.  Ophthalmic Meds Ordered this visit:  No orders of the defined types were placed in this encounter.      Return in about 3 weeks (around 11/08/2019) for DILATE OU, COLOR FP.  Patient Instructions  Patient to notify us if any change in quality of vision develops in either eye    Explained the  diagnoses, plan, and follow up with the patient and they expressed understanding.  Patient expressed understanding of the importance of proper follow up care.   Clent Demark Ennis Heavner M.D. Diseases & Surgery of the Retina and Vitreous Retina & Diabetic [place] 10/18/19     Abbreviations: M myopia (nearsighted); A astigmatism; H hyperopia (farsighted); P presbyopia; Mrx spectacle prescription;  CTL contact lenses; OD right eye; OS left eye; OU both eyes  XT exotropia; ET esotropia; PEK punctate epithelial keratitis; PEE punctate epithelial erosions; DES dry eye syndrome; MGD meibomian gland dysfunction; ATs artificial tears; PFAT's preservative free artificial tears; [place] nuclear sclerotic cataract; PSC posterior subcapsular cataract; ERM epi-retinal membrane; PVD posterior vitreous detachment; RD retinal detachment; DM diabetes mellitus; DR diabetic retinopathy; NPDR non-proliferative diabetic retinopathy; PDR proliferative diabetic retinopathy; CSME clinically significant macular edema; DME diabetic macular edema; dbh dot blot hemorrhages; CWS cotton wool spot; POAG primary open angle glaucoma; C/D cup-to-disc ratio; HVF humphrey visual field; GVF goldmann visual field; OCT optical coherence tomography; IOP intraocular pressure; BRVO Branch retinal vein occlusion; CRVO central retinal vein occlusion; CRAO central retinal artery occlusion; BRAO branch retinal artery occlusion; RT retinal tear; SB scleral buckle; PPV pars plana vitrectomy; VH Vitreous hemorrhage; PRP panretinal laser photocoagulation; IVK intravitreal kenalog; VMT vitreomacular traction; MH Macular hole;  NVD neovascularization of the disc; NVE neovascularization elsewhere; AREDS age related eye disease study; ARMD age related macular degeneration; POAG primary open angle glaucoma; EBMD epithelial/anterior basement membrane dystrophy; ACIOL anterior chamber intraocular lens; IOL intraocular lens; PCIOL posterior chamber intraocular lens;  Phaco/IOL phacoemulsification with intraocular lens placement; PRK photorefractive keratectomy; LASIK  laser assisted in situ keratomileusis; HTN hypertension; DM diabetes mellitus; COPD chronic obstructive pulmonary disease

## 2019-10-18 NOTE — Patient Instructions (Signed)
Patient to notify us if any change in quality of vision develops in either eye

## 2019-10-24 DIAGNOSIS — E782 Mixed hyperlipidemia: Secondary | ICD-10-CM | POA: Diagnosis not present

## 2019-10-24 DIAGNOSIS — R7301 Impaired fasting glucose: Secondary | ICD-10-CM | POA: Diagnosis not present

## 2019-10-24 DIAGNOSIS — D509 Iron deficiency anemia, unspecified: Secondary | ICD-10-CM | POA: Diagnosis not present

## 2019-10-24 DIAGNOSIS — R944 Abnormal results of kidney function studies: Secondary | ICD-10-CM | POA: Diagnosis not present

## 2019-10-24 DIAGNOSIS — E785 Hyperlipidemia, unspecified: Secondary | ICD-10-CM | POA: Diagnosis not present

## 2019-11-07 ENCOUNTER — Emergency Department (HOSPITAL_COMMUNITY): Payer: Medicare Other

## 2019-11-07 ENCOUNTER — Other Ambulatory Visit: Payer: Self-pay

## 2019-11-07 ENCOUNTER — Emergency Department (HOSPITAL_COMMUNITY)
Admission: EM | Admit: 2019-11-07 | Discharge: 2019-11-07 | Disposition: A | Discharge status: Home / Self Care | Payer: Medicare Other | Attending: Emergency Medicine | Admitting: Emergency Medicine

## 2019-11-07 ENCOUNTER — Encounter (HOSPITAL_COMMUNITY): Payer: Self-pay | Admitting: Emergency Medicine

## 2019-11-07 ENCOUNTER — Ambulatory Visit: Payer: Medicare Other | Admitting: Cardiology

## 2019-11-07 DIAGNOSIS — R52 Pain, unspecified: Secondary | ICD-10-CM | POA: Diagnosis not present

## 2019-11-07 DIAGNOSIS — Y939 Activity, unspecified: Secondary | ICD-10-CM | POA: Diagnosis not present

## 2019-11-07 DIAGNOSIS — S0992XA Unspecified injury of nose, initial encounter: Secondary | ICD-10-CM | POA: Diagnosis present

## 2019-11-07 DIAGNOSIS — S022XXA Fracture of nasal bones, initial encounter for closed fracture: Secondary | ICD-10-CM

## 2019-11-07 DIAGNOSIS — S0990XA Unspecified injury of head, initial encounter: Secondary | ICD-10-CM | POA: Diagnosis not present

## 2019-11-07 DIAGNOSIS — Y999 Unspecified external cause status: Secondary | ICD-10-CM | POA: Insufficient documentation

## 2019-11-07 DIAGNOSIS — W01198A Fall on same level from slipping, tripping and stumbling with subsequent striking against other object, initial encounter: Secondary | ICD-10-CM | POA: Diagnosis not present

## 2019-11-07 DIAGNOSIS — Y929 Unspecified place or not applicable: Secondary | ICD-10-CM | POA: Diagnosis not present

## 2019-11-07 DIAGNOSIS — W19XXXA Unspecified fall, initial encounter: Secondary | ICD-10-CM

## 2019-11-07 DIAGNOSIS — Z743 Need for continuous supervision: Secondary | ICD-10-CM | POA: Diagnosis not present

## 2019-11-07 DIAGNOSIS — R9431 Abnormal electrocardiogram [ECG] [EKG]: Secondary | ICD-10-CM | POA: Diagnosis not present

## 2019-11-07 DIAGNOSIS — S199XXA Unspecified injury of neck, initial encounter: Secondary | ICD-10-CM | POA: Diagnosis not present

## 2019-11-07 DIAGNOSIS — R404 Transient alteration of awareness: Secondary | ICD-10-CM | POA: Diagnosis not present

## 2019-11-07 DIAGNOSIS — R6889 Other general symptoms and signs: Secondary | ICD-10-CM | POA: Diagnosis not present

## 2019-11-07 HISTORY — DX: Unspecified atrial fibrillation: I48.91

## 2019-11-07 MED ORDER — PENICILLIN V POTASSIUM 500 MG PO TABS
500.0000 mg | ORAL_TABLET | Freq: Four times a day (QID) | ORAL | 0 refills | Status: AC
Start: 2019-11-07 — End: 2019-11-14

## 2019-11-07 MED ORDER — HYDROCODONE-ACETAMINOPHEN 5-325 MG PO TABS
1.0000 | ORAL_TABLET | Freq: Once | ORAL | Status: AC
  Administered 2019-11-07: 1 via ORAL
  Filled 2019-11-07: qty 1

## 2019-11-07 MED ORDER — HYDROCODONE-ACETAMINOPHEN 5-325 MG PO TABS: 1.0000 | ORAL_TABLET | ORAL | 0 refills | Status: DC | PRN

## 2019-11-07 MED ORDER — ACETAMINOPHEN 500 MG PO TABS: 1000.0000 mg | ORAL_TABLET | Freq: Once | ORAL | Status: DC

## 2019-11-07 NOTE — Progress Notes (Signed)
Orthopedic Tech Progress Note Patient Details:  Patricia Jackson 06-Sep-1940 [id number] Level 2 trauma Patient ID: Patricia Jackson, female   DOB: [birth year], 79 y.o.   MRN: [id number]   Patricia Jackson 11/07/2019, 8:05 PM

## 2019-11-07 NOTE — ED Notes (Signed)
Patient transported to CT 

## 2019-11-07 NOTE — Discharge Instructions (Signed)
You were evaluated in the Emergency Department and after careful evaluation, we did not find any emergent condition requiring admission or further testing in the hospital.  Your exam/testing today is overall reassuring.  Your CT scans showed a broken nose.  No other significant injuries.  You can call the ENT office number provided if you are having concerns about the appearance of your nose as it heals.  You can use the pain medication provided as needed for pain.  Please return to the Emergency Department if you experience any worsening of your condition.   Thank you for allowing [place] to be a part of your care.

## 2019-11-07 NOTE — ED Triage Notes (Signed)
Level 2 trauma fall on blood thinner face down.

## 2019-11-07 NOTE — Progress Notes (Signed)
Chaplain responded to this Level II Trauma.  Patient's son and grandson present.  Son indicated he was worried because patient could not remember exactly what happened whether she tripped or fell over.  Chaplain provided ministry of presence and offered hospitality to the family.  MD came in for evaluation, Chaplain let family know of availability as needed. [place], MDiv.    11/07/19 1950  Clinical Encounter Type  Visited With Patient and family together  Visit Type Trauma  Referral From Nurse  Consult/Referral To Chaplain  Stress Factors  Family Stress Factors  (uncertainty )

## 2019-11-07 NOTE — ED Provider Notes (Signed)
[name] Provider Note MRN:  [id number]  Arrival date & time: 11/07/19     Chief Complaint   Level 2 trauma History of Present Illness   Patricia Jackson is a 79 y.o. year-old female with a history of A. fib on Eliquis presenting to the ED with chief complaint of fall.  Patient tripped over the curb and fell forward onto her face.  She is endorsing pain to her face and nose and mouth.  Small bleeding from the nose which has stopped.  She denies loss of consciousness, no vomiting, no neck pain, no back pain, no chest pain, no shortness of breath, no abdominal pain, no injuries to the arms or legs.  Pain is mild, constant, worse with motion or palpation.  Review of Systems  A complete 10 system review of systems was obtained and all systems are negative except as noted in the HPI and PMH.   Patient's Health History   Past medical history: History of A. fib   No family history on file.  Social History   Socioeconomic History  . Marital status: Divorced    Spouse name: Not on file  . Number of children: Not on file  . Years of education: Not on file  . Highest education level: Not on file  Occupational History  . Not on file  Tobacco Use  . Smoking status: Never Smoker  . Smokeless tobacco: Never Used  Substance and Sexual Activity  . Alcohol use: Never  . Drug use: Never  . Sexual activity: Not on file  Other Topics Concern  . Not on file  Social History Narrative  . Not on file   Social Determinants of Health   Financial Resource Strain:   . Difficulty of Paying Living Expenses:   Food Insecurity:   . Worried About Charity fundraiser in the Last Year:   . Arboriculturist in the Last Year:   Transportation Needs:   . Film/video editor (Medical):   Marland Kitchen Lack of Transportation (Non-Medical):   Physical Activity:   . Days of Exercise per Week:   . Minutes of Exercise per Session:   Stress:   . Feeling of Stress :    Social Connections:   . Frequency of Communication with Friends and Family:   . Frequency of Social Gatherings with Friends and Family:   . Attends Religious Services:   . Active Member of Clubs or Organizations:   . Attends Archivist Meetings:   Marland Kitchen Marital Status:   Intimate Partner Violence:   . Fear of Current or Ex-Partner:   . Emotionally Abused:   Marland Kitchen Physically Abused:   . Sexually Abused:      Physical Exam   Vitals:   11/07/19 2002 11/07/19 2032  BP: 124/62 135/66  Pulse: 63 64  Resp: (!) 22 19  Temp:    SpO2: 98% 100%    CONSTITUTIONAL: Well-appearing, NAD NEURO:  Alert and oriented x 3, no focal deficits EYES:  eyes equal and reactive ENT/NECK:  no LAD, no JVD CARDIO: Regular rate, well-perfused, normal S1 and S2 PULM:  CTAB no wheezing or rhonchi GI/GU:  normal bowel sounds, non-distended, non-tender MSK/SPINE:  No gross deformities, no edema SKIN: Abrasion to the tip of the nose, dried blood from the right nare, no septal hematoma; dried blood within the mouth, no lacerations, loose tooth #9 PSYCH:  Appropriate speech and behavior  *Additional and/or pertinent findings included in MDM  below  Diagnostic and Interventional Summary    EKG Interpretation  Date/Time:  Wednesday November 07 2019 19:06:18 EDT Ventricular Rate:  57 PR Interval:    QRS Duration: 98 QT Interval:  469 QTC Calculation: 457 R Axis:   79 Text Interpretation: Sinus rhythm Confirmed by Gerlene Fee [phone]) on 11/07/2019 7:54:54 PM      Labs Reviewed - No data to display  CT Head Wo Contrast  Final Result    CT Cervical Spine Wo Contrast  Final Result    CT MAXILLOFACIAL WO CONTRAST  Final Result      Medications  HYDROcodone-acetaminophen (NORCO/VICODIN) 5-325 MG per tablet 1 tablet (1 tablet Oral Given 11/07/19 2007)     Procedures  /  Critical Care Procedures  ED Course and Medical Decision Making  I have reviewed the triage vital signs, the nursing notes,  and pertinent available records from the EMR.  Listed above are laboratory and imaging tests that I personally ordered, reviewed, and interpreted and then considered in my medical decision making (see below for details).      Mechanical fall on blood thinners obtaining CT imaging to exclude significant internal bleeding or fracture.  There is a loose tooth on my examination but it seems to be of poor health at baseline and it is adequately adhered, will advise dental follow-up.  Imaging is reassuring, does have nasal fracture, will follow up with ENT.  Patient also endorsing tooth pain returning for the past few days, has a history of tooth infections and will follow up with dentistry in a few days, providing penicillin.  Barth Kirks. Sedonia Small, [place] [email or web address]  Final Clinical Impressions(s) / ED Diagnoses     ICD-10-CM   1. Fall, initial encounter  W19.XXXA   2. Closed fracture of nasal bone, initial encounter  S02.2XXA     ED Discharge Orders         Ordered    penicillin v potassium (VEETID) 500 MG tablet  4 times daily     Discontinue  Reprint     11/07/19 2048    HYDROcodone-acetaminophen (NORCO/VICODIN) 5-325 MG tablet  Every 4 hours PRN     Discontinue  Reprint     11/07/19 2048           Discharge Instructions Discussed with and Provided to Patient:     Discharge Instructions     You were evaluated in the Emergency Department and after careful evaluation, we did not find any emergent condition requiring admission or further testing in the hospital.  Your exam/testing today is overall reassuring.  Your CT scans showed a broken nose.  No other significant injuries.  You can call the ENT office number provided if you are having concerns about the appearance of your nose as it heals.  You can use the pain medication provided as needed for pain.  Please return to the Emergency Department if you experience any  worsening of your condition.   Thank you for allowing [place] to be a part of your care.       Maudie Flakes, MD 11/07/19 2050

## 2019-11-08 ENCOUNTER — Encounter (INDEPENDENT_AMBULATORY_CARE_PROVIDER_SITE_OTHER): Payer: Self-pay | Admitting: Ophthalmology

## 2019-11-08 ENCOUNTER — Encounter (INDEPENDENT_AMBULATORY_CARE_PROVIDER_SITE_OTHER): Payer: Medicare Other | Admitting: Ophthalmology

## 2019-11-15 ENCOUNTER — Encounter (INDEPENDENT_AMBULATORY_CARE_PROVIDER_SITE_OTHER): Payer: Medicare Other | Admitting: Ophthalmology

## 2019-11-19 ENCOUNTER — Other Ambulatory Visit: Payer: Self-pay

## 2019-11-19 ENCOUNTER — Encounter (HOSPITAL_COMMUNITY)
Admission: RE | Admit: 2019-11-19 | Discharge: 2019-11-19 | Disposition: A | Discharge status: Home / Self Care | Payer: Medicare Other | Source: Ambulatory Visit | Attending: Internal Medicine | Admitting: Internal Medicine

## 2019-11-19 DIAGNOSIS — N185 Chronic kidney disease, stage 5: Secondary | ICD-10-CM | POA: Diagnosis not present

## 2019-11-19 DIAGNOSIS — N182 Chronic kidney disease, stage 2 (mild): Secondary | ICD-10-CM | POA: Diagnosis not present

## 2019-11-19 DIAGNOSIS — Z720 Tobacco use: Secondary | ICD-10-CM | POA: Diagnosis not present

## 2019-11-19 DIAGNOSIS — D638 Anemia in other chronic diseases classified elsewhere: Secondary | ICD-10-CM | POA: Diagnosis not present

## 2019-11-19 DIAGNOSIS — R809 Proteinuria, unspecified: Secondary | ICD-10-CM | POA: Diagnosis not present

## 2019-11-19 DIAGNOSIS — N170 Acute kidney failure with tubular necrosis: Secondary | ICD-10-CM | POA: Insufficient documentation

## 2019-11-19 DIAGNOSIS — I129 Hypertensive chronic kidney disease with stage 1 through stage 4 chronic kidney disease, or unspecified chronic kidney disease: Secondary | ICD-10-CM | POA: Diagnosis not present

## 2019-11-19 DIAGNOSIS — E211 Secondary hyperparathyroidism, not elsewhere classified: Secondary | ICD-10-CM | POA: Diagnosis not present

## 2019-11-19 DIAGNOSIS — I120 Hypertensive chronic kidney disease with stage 5 chronic kidney disease or end stage renal disease: Secondary | ICD-10-CM | POA: Diagnosis not present

## 2019-11-19 DIAGNOSIS — J441 Chronic obstructive pulmonary disease with (acute) exacerbation: Secondary | ICD-10-CM | POA: Diagnosis not present

## 2019-11-19 DIAGNOSIS — I10 Essential (primary) hypertension: Secondary | ICD-10-CM | POA: Diagnosis not present

## 2019-11-19 MED ORDER — DENOSUMAB 60 MG/ML ~~LOC~~ SOSY
60.0000 mg | PREFILLED_SYRINGE | Freq: Once | SUBCUTANEOUS | Status: AC
  Administered 2019-11-19: 60 mg via SUBCUTANEOUS
  Filled 2019-11-19: qty 1

## 2019-11-22 ENCOUNTER — Ambulatory Visit (INDEPENDENT_AMBULATORY_CARE_PROVIDER_SITE_OTHER): Payer: Medicare Other | Admitting: Ophthalmology

## 2019-11-22 ENCOUNTER — Other Ambulatory Visit: Payer: Self-pay

## 2019-11-22 ENCOUNTER — Encounter (INDEPENDENT_AMBULATORY_CARE_PROVIDER_SITE_OTHER): Payer: Self-pay | Admitting: Ophthalmology

## 2019-11-22 DIAGNOSIS — H35022 Exudative retinopathy, left eye: Secondary | ICD-10-CM

## 2019-11-22 DIAGNOSIS — H35021 Exudative retinopathy, right eye: Secondary | ICD-10-CM | POA: Diagnosis not present

## 2019-11-22 NOTE — Assessment & Plan Note (Signed)
Laser demarcation completed right eye some4 to 5 weeks previous and now the condition is stabilized

## 2019-11-22 NOTE — Progress Notes (Signed)
11/22/2019     CHIEF COMPLAINT Patient presents for Retina Follow Up   HISTORY OF PRESENT ILLNESS: Patricia Jackson is a 79 y.o. female who presents to the clinic today for:   HPI    Retina Follow Up    Patient presents with  Other.  In both eyes.  Severity is moderate.  Duration of 5 weeks.  Since onset it is stable.  I, the attending physician,  performed the HPI with the patient and updated documentation appropriately.          Comments    5 Week f\u OU. FP S/p laser ablation temporally OD  Pt states vision is stable. She c/o of multiple floaters in OS. Pt had a fall about 3 weeks ago and broke her nose and teeth.       Last edited by Hurman Horn, MD on 11/22/2019  3:48 PM. (History)      Referring physician: Celene Squibb, MD [place],  [place] [address]  HISTORICAL INFORMATION:   Selected notes from the MEDICAL RECORD NUMBER    Lab Results  Component Value Date   HGBA1C 5.6 10/30/2015     CURRENT MEDICATIONS: Current Outpatient Medications (Ophthalmic Drugs)  Medication Sig  . ofloxacin (OCUFLOX) 0.3 % ophthalmic solution Place into the left eye. (Patient not taking: Reported on 11/07/2019)  . prednisoLONE acetate (PRED FORTE) 1 % ophthalmic suspension Place into the left eye. (Patient not taking: Reported on 11/07/2019)   No current facility-administered medications for this visit. (Ophthalmic Drugs)   Current Outpatient Medications (Other)  Medication Sig  . acetaminophen (TYLENOL) 500 MG tablet Take 1,500 mg by mouth every 4 (four) hours as needed.  Marland Kitchen acetaminophen (TYLENOL) 500 MG tablet Take 1,000 mg by mouth every 6 (six) hours as needed for mild pain or headache.  . Acetaminophen-Codeine 300-15 MG TABS Take 1 tablet by mouth 2 (two) times daily as needed. (Patient not taking: Reported on 11/07/2019)  . albuterol (PROAIR HFA) 108 (90 Base) MCG/ACT inhaler Inhale 2 puffs into the lungs every 6 (six) hours as needed for wheezing or shortness of  breath.  Marland Kitchen albuterol (PROVENTIL HFA;VENTOLIN HFA) 108 (90 Base) MCG/ACT inhaler Inhale 2 puffs into the lungs every 4 (four) hours as needed.  Marland Kitchen albuterol (PROVENTIL) (2.5 MG/3ML) 0.083% nebulizer solution Take 2.5 mg by nebulization every 6 (six) hours as needed for wheezing or shortness of breath.  Marland Kitchen amLODipine-olmesartan (AZOR) 10-40 MG tablet Take 1 tablet by mouth every morning.  Marland Kitchen amoxicillin (AMOXIL) 500 MG capsule Take 500 mg by mouth every 8 (eight) hours. (Patient not taking: Reported on 11/07/2019)  . amoxicillin-clavulanate (AUGMENTIN) 875-125 MG tablet Take 1 tablet by mouth every 12 (twelve) hours.  Marland Kitchen amoxicillin-clavulanate (AUGMENTIN) 875-125 MG tablet SMARTSIG:1 Tablet(s) By Mouth Every 12 Hours (Patient not taking: Reported on 11/07/2019)  . antiseptic oral rinse (BIOTENE) LIQD 15 mLs by Mouth Rinse route as needed for dry mouth.  Marland Kitchen buPROPion (WELLBUTRIN XL) 300 MG 24 hr tablet Take 300 mg by mouth daily.   Marland Kitchen buPROPion (WELLBUTRIN XL) 300 MG 24 hr tablet Take 300 mg by mouth daily.  . chlorhexidine (PERIDEX) 0.12 % solution 15 mLs by Mouth Rinse route as directed.   . Cholecalciferol (VITAMIN D3) 50 MCG (2000 UT) TABS Take 2,000 Units by mouth daily.  Marland Kitchen denosumab (PROLIA) 60 MG/ML SOSY injection Inject 60 mg into the skin every 6 (six) months.  Marland Kitchen ELIQUIS 5 MG TABS tablet TAKE ONE  TABLET (5MG TOTAL) BY MOUTH TWOTIMES DAILY  . ELIQUIS 5 MG TABS tablet Take 5 mg by mouth 2 (two) times daily.  . ferrous sulfate 325 (65 FE) MG tablet Take 325 mg by mouth daily with breakfast.   . ferrous sulfate 325 (65 FE) MG tablet Take 325 mg by mouth daily with breakfast.  . Fluticasone-Umeclidin-Vilant (TRELEGY ELLIPTA) 100-62.5-25 MCG/INH AEPB Inhale 1 puff into the lungs daily.  Marland Kitchen HYDROcodone-acetaminophen (NORCO/VICODIN) 5-325 MG tablet Take 1 tablet by mouth every 6 (six) hours as needed for severe pain.  Marland Kitchen HYDROcodone-acetaminophen (NORCO/VICODIN) 5-325 MG tablet Take 1 tablet by mouth every 4  (four) hours as needed.  Marland Kitchen levothyroxine (SYNTHROID) 50 MCG tablet Take 50 mcg by mouth daily before breakfast.  . levothyroxine (SYNTHROID) 50 MCG tablet Take 50 mcg by mouth daily before breakfast.   . metoprolol succinate (TOPROL-XL) 25 MG 24 hr tablet Take 50 mg by mouth daily.  . metoprolol succinate (TOPROL-XL) 50 MG 24 hr tablet Take 50 mg by mouth daily.  . Multiple Vitamins-Minerals (ICAPS) CAPS Take 2 capsules by mouth daily.  . nitroGLYCERIN (NITROSTAT) 0.4 MG SL tablet PLACE ONE TABLET (0.4MG TOTAL) UNDER THETONGUE EVERY 5 MINUTES AS NEEDED (Patient not taking: Reported on 08/27/2019)  . nitroGLYCERIN (NITROSTAT) 0.4 MG SL tablet Place 0.4 mg under the tongue every 5 (five) minutes as needed for chest pain.   . rosuvastatin (CRESTOR) 20 MG tablet Take 20 mg by mouth daily.   . rosuvastatin (CRESTOR) 20 MG tablet Take 20 mg by mouth daily.  . sertraline (ZOLOFT) 25 MG tablet Take 25 mg by mouth daily.  . sertraline (ZOLOFT) 25 MG tablet Take 25 mg by mouth at bedtime.  . TRELEGY ELLIPTA 100-62.5-25 MCG/INH AEPB Inhale 1 puff into the lungs daily.   No current facility-administered medications for this visit. (Other)      REVIEW OF SYSTEMS:    ALLERGIES Allergies  Allergen Reactions  . Sulfa Antibiotics Hives  . Sulfa Antibiotics Hives and Other (See Comments)    Reaction not recalled by the patient, but "hives" were noted in her other profile in Epic    PAST MEDICAL HISTORY Past Medical History:  Diagnosis Date  . A-fib ([place])   . Atrial flutter ([place])    On Eliquis in 8/17 but discontinued after stent placement  . Breast cancer ([place])    remote  . CAD in native artery 03/16/2016  . COPD (chronic obstructive pulmonary disease) ([place])   . Hypertension   . S/P angioplasty with stent 03/15/16 DES Resolute, [place] 03/16/2016   Past Surgical History:  Procedure Laterality Date  . APPENDECTOMY    . BREAST SURGERY Left   . CARDIAC CATHETERIZATION N/A 03/15/2016    Procedure: Left Heart Cath and Coronary Angiography;  Surgeon: Belva Crome, MD;  Location: [facility];  Service: Cardiovascular;  Laterality: N/A;  . CARDIAC CATHETERIZATION N/A 03/15/2016   Procedure: Coronary Stent Intervention;  Surgeon: Belva Crome, MD;  Location: [facility];  Service: Cardiovascular;  Laterality: N/A;  . COLONOSCOPY     remote  . COLONOSCOPY N/A 05/26/2016   Procedure: COLONOSCOPY;  Surgeon: Daneil Dolin, MD;  [facility];  Service: Endoscopy;  Laterality: N/A;  845  . ESOPHAGOGASTRODUODENOSCOPY N/A 05/26/2016   Procedure: ESOPHAGOGASTRODUODENOSCOPY (EGD);  Surgeon: Daneil Dolin, MD;  [facility];  Service: Endoscopy;  Laterality: N/A;  . MALONEY DILATION N/A 05/26/2016   Procedure: Venia Minks DILATION;  Surgeon: Herbie Baltimore  Hilton Cork, MD;  [facility];  Service: Endoscopy;  Laterality: N/A;  . VITRECTOMY Left 10/03/2019   Dr. Zadie Rhine, Vitrectomy, Focal Laser, Removal of Silicone Oil    FAMILY HISTORY Family History  Problem Relation Age of Onset  . Heart disease Mother   . COPD Father   . Cancer Sister        unknown primary  . Cancer Brother        unknown primary  . Cancer Brother   . Hypertension Son   . Colon cancer Neg Hx     SOCIAL HISTORY Social History   Tobacco Use  . Smoking status: Never Smoker  . Smokeless tobacco: Never Used  Vaping Use  . Vaping Use: Never used  Substance Use Topics  . Alcohol use: Never  . Drug use: Never         OPHTHALMIC EXAM:  Base Eye Exam    Visual Acuity (Snellen - Linear)      Right Left   Dist [place] 20/40 + 20/60 -1   Dist ph [place] NI NI       Tonometry (Tonopen, 2:02 PM)      Right Left   Pressure 9 9       Pupils      Pupils Dark Light Shape React APD   Right PERRL 4 3 Round Brisk None   Left PERRL 4 4 Round Minimal +1       Visual Fields (Counting fingers)      Left Right     Full   Restrictions Partial outer superior temporal, superior nasal,  inferior nasal deficiencies        Neuro/Psych    Oriented x3: Yes   Mood/Affect: Normal       Dilation    Both eyes: 1.0% Mydriacyl, 2.5% Phenylephrine @ 2:02 PM        Slit Lamp and Fundus Exam    External Exam      Right Left   External Normal Normal       Slit Lamp Exam      Right Left   Lids/Lashes Normal Normal   Conjunctiva/Sclera White and quiet White and quiet   Cornea Clear Clear   Anterior Chamber Deep and quiet Deep and quiet   Iris Round and reactive Round and reactive   Lens Posterior chamber intraocular lens Posterior chamber intraocular lens   Anterior Vitreous Normal Normal       Fundus Exam      Right Left   Posterior Vitreous Normal Clear, vitrectomized, no oil   Disc Normal Normal   C/D Ratio 0.4 0.25   Macula Normal Normal   Vessels Normal Normal   Periphery Hemorrhagic choroidopathy temporally, now well surrounded by peripheral panretinal photocoagulation to prevent spread and to treat the ischemic cord Peripheral chorioretinal scars of laser retinopexy as well as previous sites of hemorrhagic choroidopathy,, no hemorrhage, good laser photocoagulation, retina attached.          IMAGING AND PROCEDURES  Imaging and Procedures for 11/22/19  Color Fundus Photography Optos - OU - Both Eyes       Right Eye Progression has improved. Disc findings include normal observations. Macula : normal observations. Vessels : normal observations. Periphery : hemorrhage.   Left Eye Progression has been stable. Disc findings include normal observations. Macula : normal observations. Vessels : normal observations. Periphery : normal observations.   Notes Subretinal and choroidal hemorrhage of PECHR, temporally, now well surrounded and treated  with laser retinopexy, retinal ablation  OS detached after repair of retinal detachment from [facility]                ASSESSMENT/PLAN:  Exudative retinopathy of right eye Laser demarcation completed right eye  some4 to 5 weeks previous and now the condition is stabilized      ICD-10-CM   1. Exudative retinopathy of right eye  H35.021 Color Fundus Photography Optos - OU - Both Eyes  2. Exudative retinopathy of left eye  H35.022 Color Fundus Photography Optos - OU - Both Eyes    1.  2.  3.  Ophthalmic Meds Ordered this visit:  No orders of the defined types were placed in this encounter.      Return in about 3 months (around 02/22/2020) for DILATE OU, COLOR FP.  Patient Instructions  ` To report new onset visual acuity decline distortion or haze or shadow darkness    Explained the diagnoses, plan, and follow up with the patient and they expressed understanding.  Patient expressed understanding of the importance of proper follow up care.   Clent Demark Rankin M.D. Diseases & Surgery of the Retina and Vitreous [facility] [place] 11/22/19     Abbreviations: M myopia (nearsighted); A astigmatism; H hyperopia (farsighted); P presbyopia; Mrx spectacle prescription;  CTL contact lenses; OD right eye; OS left eye; OU both eyes  XT exotropia; ET esotropia; PEK punctate epithelial keratitis; PEE punctate epithelial erosions; DES dry eye syndrome; MGD meibomian gland dysfunction; ATs artificial tears; PFAT's preservative free artificial tears; [place] nuclear sclerotic cataract; PSC posterior subcapsular cataract; ERM epi-retinal membrane; PVD posterior vitreous detachment; RD retinal detachment; DM diabetes mellitus; DR diabetic retinopathy; NPDR non-proliferative diabetic retinopathy; PDR proliferative diabetic retinopathy; CSME clinically significant macular edema; DME diabetic macular edema; dbh dot blot hemorrhages; CWS cotton wool spot; POAG primary open angle glaucoma; C/D cup-to-disc ratio; HVF humphrey visual field; GVF goldmann visual field; OCT optical coherence tomography; IOP intraocular pressure; BRVO Branch retinal vein occlusion; CRVO central retinal vein occlusion; CRAO central  retinal artery occlusion; BRAO branch retinal artery occlusion; RT retinal tear; SB scleral buckle; PPV pars plana vitrectomy; VH Vitreous hemorrhage; PRP panretinal laser photocoagulation; IVK intravitreal kenalog; VMT vitreomacular traction; MH Macular hole;  NVD neovascularization of the disc; NVE neovascularization elsewhere; AREDS age related eye disease study; ARMD age related macular degeneration; POAG primary open angle glaucoma; EBMD epithelial/anterior basement membrane dystrophy; ACIOL anterior chamber intraocular lens; IOL intraocular lens; PCIOL posterior chamber intraocular lens; Phaco/IOL phacoemulsification with intraocular lens placement; [place] photorefractive keratectomy; LASIK laser assisted in situ keratomileusis; HTN hypertension; DM diabetes mellitus; COPD chronic obstructive pulmonary disease

## 2019-11-22 NOTE — Patient Instructions (Signed)
`   To report new onset visual acuity decline distortion or haze or shadow darkness

## 2019-12-14 NOTE — Progress Notes (Signed)
Office Visit Note  Patient: Patricia Jackson             Date of Birth: [birth year]           MRN: [id number]             PCP: Celene Squibb, MD Referring: Derek Jack, MD Visit Date: 12/28/2019 Occupation: @GUAROCC @  Accompanied by her grandson  Subjective:  No chief complaint on file.   History of Present Illness: Patricia Jackson is a 79 y.o. female seen in consultation per request of her hematologist.  She has been seen by him for thrombocytopenia.  He did some recent labs and found that her ANA was elevated and referred her here for further evaluation.  Patient states she has had problems with discoloration in her hands and feet for the last 2 or 3 years.  She gives history of dry mouth.  She uses Biotene products.  She denies any history of malar rash, photosensitivity or lymphadenopathy.  She denies joint pain or joint swelling.  She states she has difficulty walking due to heaviness in her legs and some calf muscle pain.  Activities of Daily Living:  Patient reports morning stiffness for 0 minutes.   Patient Reports nocturnal pain.  Difficulty dressing/grooming: Denies Difficulty climbing stairs: Reports Difficulty getting out of chair: Reports Difficulty using hands for taps, buttons, cutlery, and/or writing: Reports  Review of Systems  Constitutional: Positive for fatigue.  HENT: Positive for mouth dryness. Negative for mouth sores and nose dryness.   Eyes: Positive for visual disturbance. Negative for pain, itching and dryness.  Respiratory: Positive for cough, shortness of breath and difficulty breathing.        Due to COPD  Cardiovascular: Positive for palpitations. Negative for chest pain and swelling in legs/feet.  Gastrointestinal: Positive for constipation. Negative for abdominal pain, blood in stool and diarrhea.  Endocrine: Negative for increased urination.  Genitourinary: Negative for difficulty urinating.  Musculoskeletal: Positive for myalgias and  myalgias. Negative for arthralgias, joint pain, joint swelling, muscle weakness, morning stiffness and muscle tenderness.  Skin: Positive for color change. Negative for rash and redness.  Allergic/Immunologic: Negative for susceptible to infections.  Neurological: Positive for dizziness, numbness, memory loss and weakness. Negative for headaches.  Hematological: Negative for bruising/bleeding tendency.  Psychiatric/Behavioral: Positive for confusion. Negative for sleep disturbance.    PMFS History:  Patient Active Problem List   Diagnosis Date Noted  . 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 Traction detachment of left retina 08/03/2019  . Left retinal detachment 08/01/2019  . Vitreous hemorrhage of left eye ([place]) 08/01/2019  . Exudative age-related macular degeneration of left eye with active choroidal neovascularization ([place]) 08/01/2019  . Retinal hemorrhage of left eye 08/01/2019  . Advanced nonexudative age-related macular degeneration of left eye with subfoveal involvement 08/01/2019  . Total retinal detachment of left eye 07/26/2019  . 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 Thrombocytopenia ([place]) 07/04/2019  . Orthostatic syncope 08/27/2017  . Atypical chest pain 08/27/2017  . Atrial flutter ([place]) 10/28/2016  . CKD (chronic kidney disease), stage IV ([place]) 10/28/2016  . COPD (chronic obstructive pulmonary disease) ([place]) 10/28/2016  . Depression 10/28/2016  . Esophageal dysphagia   . Anemia 04/07/2016  . Sinus bradycardia   . CAD in native artery 03/16/2016  . S/P angioplasty with stent 03/15/16 DES Resolute, [place] 03/16/2016  . NSTEMI (non-ST elevated myocardial infarction) ([place]) 03/12/2016  . Shortness of breath   .  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[place]) 10/31/2015  . Dyslipidemia 10/31/2015  . Atrial flutter with rapid ventricular response ([place]) 10/30/2015  . Back pain 11/26/2013  . Arm pain 11/26/2013  . Chest pain  11/26/2013  . Hypertension     Past Medical History:  Diagnosis Date  . A-fib ([place])   . Atrial flutter ([place])    On Eliquis in 8/17 but discontinued after stent placement  . Breast cancer ([place])    remote  . CAD in native artery 03/16/2016  . COPD (chronic obstructive pulmonary disease) ([place])   . Hypertension   . S/P angioplasty with stent 03/15/16 DES Resolute, pLCX 03/16/2016    Family History  Problem Relation Age of Onset  . Heart disease Mother   . COPD Father   . Cancer Sister        unknown primary  . Cancer Brother        unknown primary  . Cancer Brother   . Hypertension Son   . Colon cancer Neg Hx    Past Surgical History:  Procedure Laterality Date  . APPENDECTOMY    . BREAST SURGERY Left   . CARDIAC CATHETERIZATION N/A 03/15/2016   Procedure: Left Heart Cath and Coronary Angiography;  Surgeon: Belva Crome, MD;  Location: [facility];  Service: Cardiovascular;  Laterality: N/A;  . CARDIAC CATHETERIZATION N/A 03/15/2016   Procedure: Coronary Stent Intervention;  Surgeon: Belva Crome, MD;  Location: [facility];  Service: Cardiovascular;  Laterality: N/A;  . COLONOSCOPY     remote  . COLONOSCOPY N/A 05/26/2016   Procedure: COLONOSCOPY;  Surgeon: Daneil Dolin, MD;  [facility];  Service: Endoscopy;  Laterality: N/A;  845  . ESOPHAGOGASTRODUODENOSCOPY N/A 05/26/2016   Procedure: ESOPHAGOGASTRODUODENOSCOPY (EGD);  Surgeon: Daneil Dolin, MD;  [facility];  Service: Endoscopy;  Laterality: N/A;  Venia Minks DILATION N/A 05/26/2016   Procedure: Venia Minks DILATION;  Surgeon: Daneil Dolin, MD;  [facility];  Service: Endoscopy;  Laterality: N/A;  . VITRECTOMY Left 10/03/2019   Dr. Zadie Rhine, Vitrectomy, Focal Laser, Removal of Silicone Oil   Social History   Social History Narrative   ** Merged History Encounter **       Immunization History  Administered Date(s) Administered  . Influenza,inj,Quad PF,6+ Mos  01/27/2016  . Moderna SARS-COVID-2 Vaccination 06/15/2019, 07/16/2019  . Tdap 09/04/2019     Objective: Vital Signs: BP 127/74 (BP Location: Right Arm, Patient Position: Sitting, Cuff Size: Small) Comment: Patient is unable to have BP checked in left arm  Pulse (!) 59   Ht 5' 8.5" (1.74 m)   Wt 155 lb 3.2 oz (70.4 kg)   BMI 23.25 kg/m    Physical Exam Vitals and nursing note reviewed.  Constitutional:      Appearance: She is well-developed.  HENT:     Head: Normocephalic and atraumatic.  Eyes:     Conjunctiva/sclera: Conjunctivae normal.  Cardiovascular:     Rate and Rhythm: Normal rate and regular rhythm.     Heart sounds: Normal heart sounds.  Pulmonary:     Effort: Pulmonary effort is normal.     Breath sounds: Normal breath sounds.  Abdominal:     General: Bowel sounds are normal.     Palpations: Abdomen is soft.  Musculoskeletal:     Cervical back: Normal range of motion.  Lymphadenopathy:     Cervical: No cervical adenopathy.  Skin:    General: Skin is warm and dry.  Capillary Refill: Capillary refill takes less than 2 seconds.     Comments: Bluish discoloration was noted on her toes.  No sclerodactyly was noted.  No nailbed capillary changes were noted.  Neurological:     Mental Status: She is alert and oriented to person, place, and time.  Psychiatric:        Behavior: Behavior normal.      Musculoskeletal Exam: C-spine was in good range of motion.  Shoulder joints, elbow joints, wrist joints, MCPs PIPs and DIPs with good range of motion with no synovitis.  Hip joints, knee joints, ankles, MTPs and PIPs with good range of motion with no synovitis.  CDAI Exam: CDAI Score: -- Patient Global: --; Provider Global: -- Swollen: --; Tender: -- Joint Exam 12/28/2019   No joint exam has been documented for this visit   There is currently no information documented on the homunculus. Go to the Rheumatology activity and complete the homunculus joint  exam.  Investigation: No additional findings.  Imaging: No results found.  Recent Labs: Lab Results  Component Value Date   WBC 7.3 07/06/2019   HGB 12.4 07/06/2019   PLT 112 (L) 07/06/2019   NA 141 07/06/2019   K 4.5 07/06/2019   CL 114 (H) 07/06/2019   CO2 22 07/06/2019   GLUCOSE 104 (H) 07/06/2019   BUN 26 (H) 07/06/2019   CREATININE 1.53 (H) 07/06/2019   BILITOT 0.5 07/04/2019   ALKPHOS 66 07/04/2019   AST 40 07/04/2019   ALT 27 07/04/2019   PROT 6.8 07/04/2019   ALBUMIN 4.1 07/04/2019   CALCIUM 9.1 07/06/2019   GFRAA 38 (L) 07/06/2019    Speciality Comments: No specialty comments available.  Procedures:  No procedures performed Allergies: Sulfa antibiotics and Sulfa antibiotics   Assessment / Plan:     Visit Diagnoses: Positive ANA (antinuclear antibody) - 07/04/19: ANA 1:1280H, 1:320S, CRP 0.6, ESR 24.  Patient has positive ANA.  She had noted clinical features of lupus.  She has thrombocytopenia and sicca symptoms.  Have will obtain AVISE labs today.  Raynaud's syndrome without gangrene-she has been experiencing Raynaud's for the last few years.  I am uncertain if it is related to her autoimmune disease or COPD.  She is on metoprolol due to coronary artery disease.  Which may also contribute to Raynaud's phenomenon.  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HCC)-she has some shortness of breath.  Followed by Dr. Vaughan Browner  Essential hypertension-her blood pressure was well controlled today.  Atrial flutter with rapid ventricular response (HCC)  NSTEMI (non-ST elevated myocardial infarction) ([place])  CAD in native artery  S/P angioplasty with stent 03/15/16 DES Resolute, pLCX  Dyslipidemia  Sinus bradycardia  Exudative age-related macular degeneration of left eye with active choroidal neovascularization (HCC)  Esophageal dysphagia  CKD (chronic kidney disease), stage IV (HCC)  Thrombocytopenia (HCC)-followed by hematology.  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  No orders of the defined types were placed in this encounter.   Follow-Up Instructions: Return for Positive ANA, thrombocytopenia.   Bo Merino, MD  Note - This record has been created using Editor, commissioning.  Chart creation errors have been sought, but may not always  have been located. Such creation errors do not reflect on  the standard of medical care.

## 2019-12-24 DIAGNOSIS — N182 Chronic kidney disease, stage 2 (mild): Secondary | ICD-10-CM | POA: Diagnosis not present

## 2019-12-24 DIAGNOSIS — J441 Chronic obstructive pulmonary disease with (acute) exacerbation: Secondary | ICD-10-CM | POA: Diagnosis not present

## 2019-12-24 DIAGNOSIS — I129 Hypertensive chronic kidney disease with stage 1 through stage 4 chronic kidney disease, or unspecified chronic kidney disease: Secondary | ICD-10-CM | POA: Diagnosis not present

## 2019-12-24 DIAGNOSIS — Z720 Tobacco use: Secondary | ICD-10-CM | POA: Diagnosis not present

## 2019-12-24 DIAGNOSIS — I10 Essential (primary) hypertension: Secondary | ICD-10-CM | POA: Diagnosis not present

## 2019-12-28 ENCOUNTER — Ambulatory Visit (INDEPENDENT_AMBULATORY_CARE_PROVIDER_SITE_OTHER): Payer: Medicare Other | Admitting: Rheumatology

## 2019-12-28 ENCOUNTER — Other Ambulatory Visit: Payer: Self-pay

## 2019-12-28 ENCOUNTER — Encounter: Payer: Self-pay | Admitting: Rheumatology

## 2019-12-28 VITALS — BP 127/74 | HR 59 | Ht 68.5 in | Wt 155.2 lb

## 2019-12-28 DIAGNOSIS — N184 Chronic kidney disease, stage 4 (severe): Secondary | ICD-10-CM

## 2019-12-28 DIAGNOSIS — I214 Non-ST elevation (NSTEMI) myocardial infarction: Secondary | ICD-10-CM

## 2019-12-28 DIAGNOSIS — I7300 Raynaud's syndrome without gangrene: Secondary | ICD-10-CM | POA: Diagnosis not present

## 2019-12-28 DIAGNOSIS — R768 Other specified abnormal immunological findings in serum: Secondary | ICD-10-CM | POA: Diagnosis not present

## 2019-12-28 DIAGNOSIS — R1319 Other dysphagia: Secondary | ICD-10-CM

## 2019-12-28 DIAGNOSIS — I4892 Unspecified atrial flutter: Secondary | ICD-10-CM

## 2019-12-28 DIAGNOSIS — M810 Age-related osteoporosis without current pathological fracture: Secondary | ICD-10-CM

## 2019-12-28 DIAGNOSIS — J8410 Pulmonary fibrosis, unspecified: Secondary | ICD-10-CM | POA: Diagnosis not present

## 2019-12-28 DIAGNOSIS — R1310 Dysphagia, unspecified: Secondary | ICD-10-CM

## 2019-12-28 DIAGNOSIS — I10 Essential (primary) hypertension: Secondary | ICD-10-CM

## 2019-12-28 DIAGNOSIS — E785 Hyperlipidemia, unspecified: Secondary | ICD-10-CM

## 2019-12-28 DIAGNOSIS — D696 Thrombocytopenia, unspecified: Secondary | ICD-10-CM

## 2019-12-28 DIAGNOSIS — R001 Bradycardia, unspecified: Secondary | ICD-10-CM

## 2019-12-28 DIAGNOSIS — Z95820 Peripheral vascular angioplasty status with implants and grafts: Secondary | ICD-10-CM

## 2019-12-28 DIAGNOSIS — Z8709 Personal history of other diseases of the respiratory system: Secondary | ICD-10-CM

## 2019-12-28 DIAGNOSIS — H353221 Exudative age-related macular degeneration, left eye, with active choroidal neovascularization: Secondary | ICD-10-CM

## 2019-12-28 DIAGNOSIS — R7689 Other specified abnormal immunological findings in serum: Secondary | ICD-10-CM

## 2019-12-28 DIAGNOSIS — I2510 Atherosclerotic heart disease of native coronary artery without angina pectoris: Secondary | ICD-10-CM

## 2019-12-31 ENCOUNTER — Encounter: Payer: Self-pay | Admitting: Rheumatology

## 2019-12-31 DIAGNOSIS — D8989 Other specified disorders involving the immune mechanism, not elsewhere classified: Secondary | ICD-10-CM | POA: Diagnosis not present

## 2019-12-31 DIAGNOSIS — I7300 Raynaud's syndrome without gangrene: Secondary | ICD-10-CM | POA: Diagnosis not present

## 2019-12-31 DIAGNOSIS — R768 Other specified abnormal immunological findings in serum: Secondary | ICD-10-CM | POA: Diagnosis not present

## 2020-01-03 ENCOUNTER — Telehealth: Payer: Self-pay | Admitting: Cardiology

## 2020-01-03 NOTE — Telephone Encounter (Signed)
  Patient Consent for Virtual Visit         Patricia Jackson has provided verbal consent on 01/03/2020 for a virtual visit (video or telephone).   CONSENT FOR VIRTUAL VISIT FOR:  Patricia Jackson  By participating in this virtual visit I agree to the following:  I hereby voluntarily request, consent and authorize [facility] and its employed or contracted physicians, physician assistants, nurse practitioners or other licensed health care professionals (the Practitioner), to provide me with telemedicine health care services (the "Services") as deemed necessary by the treating Practitioner. I acknowledge and consent to receive the Services by the Practitioner via telemedicine. I understand that the telemedicine visit will involve communicating with the Practitioner through live audiovisual communication technology and the disclosure of certain medical information by electronic transmission. I acknowledge that I have been given the opportunity to request an in-person assessment or other available alternative prior to the telemedicine visit and am voluntarily participating in the telemedicine visit.  I understand that I have the right to withhold or withdraw my consent to the use of telemedicine in the course of my care at any time, without affecting my right to future care or treatment, and that the Practitioner or I may terminate the telemedicine visit at any time. I understand that I have the right to inspect all information obtained and/or recorded in the course of the telemedicine visit and may receive copies of available information for a reasonable fee.  I understand that some of the potential risks of receiving the Services via telemedicine include:  Marland Kitchen Delay or interruption in medical evaluation due to technological equipment failure or disruption; . Information transmitted may not be sufficient (e.g. poor resolution of images) to allow for appropriate medical decision making by the Practitioner;  and/or  . In rare instances, security protocols could fail, causing a breach of personal health information.  Furthermore, I acknowledge that it is my responsibility to provide information about my medical history, conditions and care that is complete and accurate to the best of my ability. I acknowledge that Practitioner's advice, recommendations, and/or decision may be based on factors not within their control, such as incomplete or inaccurate data provided by me or distortions of diagnostic images or specimens that may result from electronic transmissions. I understand that the practice of medicine is not an exact science and that Practitioner makes no warranties or guarantees regarding treatment outcomes. I acknowledge that a copy of this consent can be made available to me via my patient portal ([facility]), or I can request a printed copy by calling the office of [facility].    I understand that my insurance will be billed for this visit.   I have read or had this consent read to me. . I understand the contents of this consent, which adequately explains the benefits and risks of the Services being provided via telemedicine.  . I have been provided ample opportunity to ask questions regarding this consent and the Services and have had my questions answered to my satisfaction. . I give my informed consent for the services to be provided through the use of telemedicine in my medical care

## 2020-01-08 ENCOUNTER — Other Ambulatory Visit: Payer: Self-pay

## 2020-01-08 ENCOUNTER — Encounter: Payer: Self-pay | Admitting: Cardiology

## 2020-01-08 ENCOUNTER — Telehealth (INDEPENDENT_AMBULATORY_CARE_PROVIDER_SITE_OTHER): Payer: Medicare Other | Admitting: Cardiology

## 2020-01-08 VITALS — BP 91/55 | HR 52 | Ht 68.0 in | Wt 155.0 lb

## 2020-01-08 DIAGNOSIS — I4892 Unspecified atrial flutter: Secondary | ICD-10-CM | POA: Diagnosis not present

## 2020-01-08 DIAGNOSIS — R0789 Other chest pain: Secondary | ICD-10-CM | POA: Diagnosis not present

## 2020-01-08 DIAGNOSIS — I2510 Atherosclerotic heart disease of native coronary artery without angina pectoris: Secondary | ICD-10-CM

## 2020-01-08 NOTE — Progress Notes (Signed)
Virtual Visit via Telephone Note   This visit type was conducted due to national recommendations for restrictions regarding the COVID-19 Pandemic (e.g. social distancing) in an effort to limit this patient's exposure and mitigate transmission in our community.  Due to her co-morbid illnesses, this patient is at least at moderate risk for complications without adequate follow up.  This format is felt to be most appropriate for this patient at this time.  The patient did not have access to video technology/had technical difficulties with video requiring transitioning to audio format only (telephone).  All issues noted in this document were discussed and addressed.  No physical exam could be performed with this format.  Please refer to the patient's chart for her  consent to telehealth for [facility].    Date:  01/08/2020   ID:  Patricia Jackson, DOB January 28, 1941, MRN [id number] The patient was identified using 2 identifiers.  Patient Location: Home Provider Location: Office/Clinic  PCP:  Celene Squibb, MD  Cardiologist:  Dr Carlyle Dolly MD Electrophysiologist:  None   Evaluation Performed:  Follow-Up Visit  Chief Complaint:  Follow up visit  History of Present Illness:    Patricia Jackson is a 79 y.o. female seen today for follow up of the following medical problems.    1. Atypical chest pain/CAD - last stent12/2017 - recent admission with chest pain - described as back pain radiating down both arms that woke her from sleep -started lower neck/upper back, radiatiated to both arms Sharp pain, worst with position. Pain lasted approx 1 hour. No SOB, no diaphoresis, no N/V. No recurrence of pain since discharge - no evidence of ACS by EKG or enzymes - echo 11/2013 LVEF 88-91%, grade I diastolic dysfunction - CT chest withoutaneurysm or dissection, no PE. +atherosclerotic changes in aorta.   -last visit reported  mild nonspecific pains at times. Left chest, pinching feeling.  Lasts about 1 minute. Mild dizziness at times. Can occur at rest or with activity. -comcpliant with meds  - chornic aytpical neck/chest/arm symptoms, unchnaged  - chronic symptoms unchanged, overall infrequent.    2. Aflutter - admit 11/2015 pneumonia, found to be in aflutter -denies any recent symptoms  - history of severe anemia during admission Jan 2018 while on ASA/plavix/eliquis after stent . - had a fall 11/2019, seen in ER. Negative CT head for bleed, did have nasal fracture. She reports a mechanical - no palpitations.    3. COPD - followed by pulmonary   4. Anemia - elquis stopped during prior admission with anemiaJan 2018, transfused at that time. She was on brilinta and eliquis at the time, changed to just plavix.    4. Raynauds syndrome - followed by rheum   5. HTN - reports high bp's few months back    The patient does not have symptoms concerning for COVID-19 infection (fever, chills, cough, or new shortness of breath).    Past Medical History:  Diagnosis Date  . A-fib ([place])   . Atrial flutter ([place])    On Eliquis in 8/17 but discontinued after stent placement  . Breast cancer ([place])    remote  . CAD in native artery 03/16/2016  . COPD (chronic obstructive pulmonary disease) ([place])   . Hypertension   . S/P angioplasty with stent 03/15/16 DES Resolute, [place] 03/16/2016   Past Surgical History:  Procedure Laterality Date  . APPENDECTOMY    . BREAST SURGERY Left   . CARDIAC CATHETERIZATION N/A 03/15/2016   Procedure: Left Heart Cath and  Coronary Angiography;  Surgeon: Belva Crome, MD;  Location: [facility];  Service: Cardiovascular;  Laterality: N/A;  . CARDIAC CATHETERIZATION N/A 03/15/2016   Procedure: Coronary Stent Intervention;  Surgeon: Belva Crome, MD;  Location: [place] [facility];  Service: Cardiovascular;  Laterality: N/A;  . COLONOSCOPY     remote  . COLONOSCOPY N/A 05/26/2016   Procedure: COLONOSCOPY;  Surgeon: Daneil Dolin, MD;  [facility];  Service: Endoscopy;  Laterality: N/A;  845  . ESOPHAGOGASTRODUODENOSCOPY N/A 05/26/2016   Procedure: ESOPHAGOGASTRODUODENOSCOPY (EGD);  Surgeon: Daneil Dolin, MD;  [facility];  Service: Endoscopy;  Laterality: N/A;  Venia Minks DILATION N/A 05/26/2016   Procedure: Venia Minks DILATION;  Surgeon: Daneil Dolin, MD;  [facility];  Service: Endoscopy;  Laterality: N/A;  . VITRECTOMY Left 10/03/2019   Dr. Zadie Rhine, Vitrectomy, Focal Laser, Removal of Silicone Oil     No outpatient medications have been marked as taking for the 01/08/20 encounter (Appointment) with Arnoldo Lenis, MD.     Allergies:   Sulfa antibiotics and Sulfa antibiotics   Social History   Tobacco Use  . Smoking status: Former Smoker    Quit date: 12/28/2014    Years since quitting: 5.0  . Smokeless tobacco: Never Used  . Tobacco comment: Patient quit within the last 10 years  Vaping Use  . Vaping Use: Never used  Substance Use Topics  . Alcohol use: Never  . Drug use: Never     Family Hx: The patient's family history includes COPD in her father; Cancer in her brother, brother, and sister; Heart disease in her mother; Hypertension in her son. There is no history of Colon cancer.  ROS:   Please see the history of present illness.     All other systems reviewed and are negative.   Prior CV studies:   The following studies were reviewed today:  11/2013 Echo Study Conclusions  - Left ventricle: The cavity size was normal. Wall thickness was at the upper limits of normal. Systolic function was vigorous. The estimated ejection fraction was in the range of 65% to 70%. Wall motion was normal; there were no regional wall motion abnormalities. Doppler parameters are consistent with abnormal left ventricular relaxation (grade 1 diastolic dysfunction). Doppler parameters are consistent with elevated ventricular end-diastolic filling pressure. - Mitral  valve: Calcified annulus. There was mild regurgitation. - Right atrium: Central venous pressure (est): 3 mm Hg. - Atrial septum: No defect or patent foramen ovale was identified. - Tricuspid valve: There was trivial regurgitation. - Pulmonary arteries: PA peak pressure: 28 mm Hg (S). - Pericardium, extracardiac: There was no pericardial effusion.  Impressions:  - Upper normal LV wall thickness with LVEF 16-10%, grade 1 diastolic dysfunction with increased filling pressures. Mild mitral regurgitation. Trivial tricuspid regurgitation with normal PASP 28 mmHg.  10/2015 Echo Study Conclusions  - Left ventricle: Systolic function was normal. The estimated ejection fraction was in the range of 55% to 60%. Wall motion was normal; there were no regional wall motion abnormalities. 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 - Mitral valve: Calcified annulus. There was mild to moderate regurgitation directed centrally. Valve area by pressure half-time: 2.34 cm^2. - Pulmonary arteries: Systolic pressure was mildly increased. PA peak pressure: 41 mm Hg (S).  03/2016 cath  The left ventricular ejection fraction is 55-65% by visual estimate.  The left ventricular systolic function is normal.  A STENT RESOLUTE ONYX  0.86P61 drug eluting stent was successfully placed, and does not overlap previously placed stent.  Prox Cx to Mid Cx lesion, 99 %stenosed.  Post intervention, there is a 0% residual stenosis.   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  Otherwise widely patent coronary arteries  Successful angioplasty and stenting of the proximal circumflex reducing a greater than 95% stenosis to 0% with TIMI grade 3 flow. Resolute Onyx 2.75 x 12 mm stent deployed at 14 atm.  Recommendations:   Aspirin, Brilinta, and Eliquis x 1 month then drop aspirin. See further instruction below for 3 months and beyond.  Eliquis  should start in AM as long as no recurrent AF/Afl.  Would also consider switch to Plavix at 3 months to decrease the risk of bleeding on Eliquis.  Labs/Other Tests and Data Reviewed:    EKG:  No ECG reviewed.  Recent Labs: 07/04/2019: ALT 27 07/06/2019: BUN 26; Creatinine, Ser 1.53; Hemoglobin 12.4; Platelets 112; Potassium 4.5; Sodium 141   Recent Lipid Panel Lab Results  Component Value Date/Time   CHOL 159 03/13/2016 03:24 AM   TRIG 63 03/13/2016 03:24 AM   HDL 70 03/13/2016 03:24 AM   CHOLHDL 2.3 03/13/2016 03:24 AM   LDLCALC 76 03/13/2016 03:24 AM    Wt Readings from Last 3 Encounters:  12/28/19 155 lb 3.2 oz (70.4 kg)  11/07/19 154 lb 15.7 oz (70.3 kg)  09/04/19 159 lb (72.1 kg)     Objective:    Vital Signs:   Today's Vitals   01/08/20 1201  BP: (!) 91/55  Pulse: (!) 52  Weight: 155 lb (70.3 kg)  Height: 5' 8"  (1.727 m)   Body mass index is 23.57 kg/m. Normal affect. Normal speech pattern and tone. No audible signs of SOB or wheezing. Comfortable, no paarent distress.   ASSESSMENT & PLAN:     1. CAD - chronic atypical sympotms unchanged - we will continue to monitor at this time.   2. Aflutter -no sytmpoms, continue current meds including anticoag.   3. HTN - low bp reported today from her home cuff. At recent rheum appt bp was perfect at 120/70. Unclear accuracy of her home measurements. Will have come for a bp check and bring her home cuffs with her.   F/u 6 months  COVID-19 Education: The signs and symptoms of COVID-19 were discussed with the patient and how to seek care for testing (follow up with PCP or arrange E-visit).  The importance of social distancing was discussed today.  Time:   Today, I have spent 15 minutes with the patient with telehealth technology discussing the above problems.     Medication Adjustments/Labs and Tests Ordered: Current medicines are reviewed at length with the patient today.  Concerns regarding medicines are  outlined above.   Tests Ordered: No orders of the defined types were placed in this encounter.   Medication Changes: No orders of the defined types were placed in this encounter.   Follow Up:  In Person in 6 month(s)  Signed, Carlyle Dolly, MD  01/08/2020 11:07 AM    [place]

## 2020-01-09 ENCOUNTER — Telehealth: Payer: Self-pay | Admitting: Cardiology

## 2020-01-09 NOTE — Telephone Encounter (Signed)
Pt called stating Dr.Branch informed her that she could drop by office for BP check with her machine  Please call [phone]    thanks renee

## 2020-01-09 NOTE — Telephone Encounter (Signed)
Pt will come by the office between 9-12 am on tomorrow.

## 2020-01-23 DIAGNOSIS — I10 Essential (primary) hypertension: Secondary | ICD-10-CM | POA: Diagnosis not present

## 2020-01-23 DIAGNOSIS — N182 Chronic kidney disease, stage 2 (mild): Secondary | ICD-10-CM | POA: Diagnosis not present

## 2020-01-23 DIAGNOSIS — E782 Mixed hyperlipidemia: Secondary | ICD-10-CM | POA: Diagnosis not present

## 2020-01-23 DIAGNOSIS — D509 Iron deficiency anemia, unspecified: Secondary | ICD-10-CM | POA: Diagnosis not present

## 2020-01-23 DIAGNOSIS — I129 Hypertensive chronic kidney disease with stage 1 through stage 4 chronic kidney disease, or unspecified chronic kidney disease: Secondary | ICD-10-CM | POA: Diagnosis not present

## 2020-01-25 ENCOUNTER — Other Ambulatory Visit: Payer: Self-pay | Admitting: *Deleted

## 2020-01-25 DIAGNOSIS — Z87891 Personal history of nicotine dependence: Secondary | ICD-10-CM

## 2020-01-27 NOTE — Progress Notes (Deleted)
Office Visit Note  Patient: Patricia Jackson             Date of Birth: [birth year]           MRN: [id number]             PCP: Celene Squibb, MD Referring: Celene Squibb, MD Visit Date: 02/06/2020 Occupation: _0 @  Subjective:  No chief complaint on file.   History of Present Illness: Patricia Jackson is a 79 y.o. female ***   Activities of Daily Living:  Patient reports morning stiffness for *** {minute/hour:19697}.   Patient {ACTIONS;DENIES/REPORTS:21021675::"Denies"} nocturnal pain.  Difficulty dressing/grooming: {ACTIONS;DENIES/REPORTS:21021675::"Denies"} Difficulty climbing stairs: {ACTIONS;DENIES/REPORTS:21021675::"Denies"} Difficulty getting out of chair: {ACTIONS;DENIES/REPORTS:21021675::"Denies"} Difficulty using hands for taps, buttons, cutlery, and/or writing: {ACTIONS;DENIES/REPORTS:21021675::"Denies"}  No Rheumatology ROS completed.   PMFS History:  Patient Active Problem List   Diagnosis Date Noted  . 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 Traction detachment of left retina 08/03/2019  . Left retinal detachment 08/01/2019  . Vitreous hemorrhage of left eye ([place]) 08/01/2019  . 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 Retinal hemorrhage of left eye 08/01/2019  . Advanced nonexudative age-related macular degeneration of left eye with subfoveal involvement 08/01/2019  . Total retinal detachment of left eye 07/26/2019  . 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 Thrombocytopenia ([place]) 07/04/2019  . Orthostatic syncope 08/27/2017  . Atypical chest pain 08/27/2017  . Atrial flutter ([place]) 10/28/2016  . CKD (chronic kidney disease), stage IV ([place]) 10/28/2016  . COPD (chronic obstructive pulmonary disease) ([place]) 10/28/2016  . Depression 10/28/2016  . Esophageal dysphagia   . Anemia 04/07/2016  . Sinus bradycardia   . CAD in  native artery 03/16/2016  . S/P angioplasty with stent 03/15/16 DES Resolute, [place] 03/16/2016  . NSTEMI (non-ST elevated myocardial infarction) ([place]) 03/12/2016  . Shortness of breath   . 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[place]) 10/31/2015  . Dyslipidemia 10/31/2015  . Atrial flutter with rapid ventricular response ([place]) 10/30/2015  . Back pain 11/26/2013  . Arm pain 11/26/2013  . Chest pain 11/26/2013  . Hypertension     Past Medical History:  Diagnosis Date  . A-fib ([place])   . Atrial flutter ([place])    On Eliquis in 8/17 but discontinued after stent placement  . Breast cancer ([place])    remote  . CAD in native artery 03/16/2016  . COPD (chronic obstructive pulmonary disease) ([place])   . Hypertension   . S/P angioplasty with stent 03/15/16 DES Resolute, [place]X 03/16/2016    Family History  Problem Relation Age of Onset  . Heart disease Mother   . COPD Father   . Cancer Sister        unknown primary  . Cancer Brother        unknown primary  . Cancer Brother   . Hypertension Son   . Colon cancer Neg Hx    Past Surgical History:  Procedure Laterality Date  . APPENDECTOMY    . BREAST SURGERY Left   . CARDIAC CATHETERIZATION N/A 03/15/2016   Procedure: Left Heart Cath and Coronary Angiography;  Surgeon: Belva Crome, MD;  Location: [facility];  Service: Cardiovascular;  Laterality: N/A;  . CARDIAC CATHETERIZATION N/A 03/15/2016   Procedure: Coronary Stent Intervention;  Surgeon: Belva Crome, MD;  Location: [facility];  Service: Cardiovascular;  Laterality: N/A;  . COLONOSCOPY     remote  .  COLONOSCOPY N/A 05/26/2016   Procedure: COLONOSCOPY;  Surgeon: Daneil Dolin, MD;  [facility];  Service: Endoscopy;  Laterality: N/A;  845  . ESOPHAGOGASTRODUODENOSCOPY N/A 05/26/2016   Procedure: ESOPHAGOGASTRODUODENOSCOPY (EGD);  Surgeon: Daneil Dolin, MD;  [facility];  Service: Endoscopy;  Laterality: N/A;  Venia Minks DILATION N/A 05/26/2016   Procedure: Venia Minks  DILATION;  Surgeon: Daneil Dolin, MD;  [facility];  Service: Endoscopy;  Laterality: N/A;  . VITRECTOMY Left 10/03/2019   Dr. Zadie Rhine, Vitrectomy, Focal Laser, Removal of Silicone Oil   Social History   Social History Narrative   ** Merged History Encounter **       Immunization History  Administered Date(s) Administered  . Influenza,inj,Quad PF,6+ Mos 01/27/2016  . Moderna SARS-COVID-2 Vaccination 06/15/2019, 07/16/2019  . Tdap 09/04/2019     Objective: Vital Signs: There were no vitals taken for this visit.   Physical Exam   Musculoskeletal Exam: ***  CDAI Exam: CDAI Score: -- Patient Global: --; Provider Global: -- Swollen: --; Tender: -- Joint Exam 02/06/2020   No joint exam has been documented for this visit   There is currently no information documented on the homunculus. Go to the Rheumatology activity and complete the homunculus joint exam.  Investigation: No additional findings.  Imaging: No results found.  Recent Labs: Lab Results  Component Value Date   WBC 7.3 07/06/2019   HGB 12.4 07/06/2019   PLT 112 (L) 07/06/2019   NA 141 07/06/2019   K 4.5 07/06/2019   CL 114 (H) 07/06/2019   CO2 22 07/06/2019   GLUCOSE 104 (H) 07/06/2019   BUN 26 (H) 07/06/2019   CREATININE 1.53 (H) 07/06/2019   BILITOT 0.5 07/04/2019   ALKPHOS 66 07/04/2019   AST 40 07/04/2019   ALT 27 07/04/2019   PROT 6.8 07/04/2019   ALBUMIN 4.1 07/04/2019   CALCIUM 9.1 07/06/2019   GFRAA 38 (L) 07/06/2019   07/04/19: ANA 1:1280H, 1:320S, CRP 0.6, ESR 24.   Speciality Comments: No specialty comments available.  Procedures:  No procedures performed Allergies: Sulfa antibiotics and Sulfa antibiotics   Assessment / Plan:     Visit Diagnoses: No diagnosis found.  Orders: No orders of the defined types were placed in this encounter.  No orders of the defined types were placed in this encounter.   Face-to-face time spent with patient was *** minutes. Greater  than 50% of time was spent in counseling and coordination of care.  Follow-Up Instructions: No follow-ups on file.   Bo Merino, MD  Note - This record has been created using Editor, commissioning.  Chart creation errors have been sought, but may not always  have been located. Such creation errors do not reflect on  the standard of medical care.

## 2020-02-06 ENCOUNTER — Ambulatory Visit: Payer: Medicare Other | Admitting: Rheumatology

## 2020-02-06 DIAGNOSIS — E785 Hyperlipidemia, unspecified: Secondary | ICD-10-CM

## 2020-02-06 DIAGNOSIS — Z7189 Other specified counseling: Secondary | ICD-10-CM

## 2020-02-06 DIAGNOSIS — M810 Age-related osteoporosis without current pathological fracture: Secondary | ICD-10-CM

## 2020-02-06 DIAGNOSIS — R1319 Other dysphagia: Secondary | ICD-10-CM

## 2020-02-06 DIAGNOSIS — I2510 Atherosclerotic heart disease of native coronary artery without angina pectoris: Secondary | ICD-10-CM

## 2020-02-06 DIAGNOSIS — I10 Essential (primary) hypertension: Secondary | ICD-10-CM

## 2020-02-06 DIAGNOSIS — I7300 Raynaud's syndrome without gangrene: Secondary | ICD-10-CM

## 2020-02-06 DIAGNOSIS — N184 Chronic kidney disease, stage 4 (severe): Secondary | ICD-10-CM

## 2020-02-06 DIAGNOSIS — Z95820 Peripheral vascular angioplasty status with implants and grafts: Secondary | ICD-10-CM

## 2020-02-06 DIAGNOSIS — I214 Non-ST elevation (NSTEMI) myocardial infarction: Secondary | ICD-10-CM

## 2020-02-06 DIAGNOSIS — D696 Thrombocytopenia, unspecified: Secondary | ICD-10-CM

## 2020-02-06 DIAGNOSIS — R768 Other specified abnormal immunological findings in serum: Secondary | ICD-10-CM

## 2020-02-06 DIAGNOSIS — J8410 Pulmonary fibrosis, unspecified: Secondary | ICD-10-CM

## 2020-02-06 DIAGNOSIS — H353221 Exudative age-related macular degeneration, left eye, with active choroidal neovascularization: Secondary | ICD-10-CM

## 2020-02-06 DIAGNOSIS — R001 Bradycardia, unspecified: Secondary | ICD-10-CM

## 2020-02-06 DIAGNOSIS — I4892 Unspecified atrial flutter: Secondary | ICD-10-CM

## 2020-02-06 DIAGNOSIS — Z8709 Personal history of other diseases of the respiratory system: Secondary | ICD-10-CM

## 2020-02-07 DIAGNOSIS — E782 Mixed hyperlipidemia: Secondary | ICD-10-CM | POA: Diagnosis not present

## 2020-02-07 DIAGNOSIS — N182 Chronic kidney disease, stage 2 (mild): Secondary | ICD-10-CM | POA: Diagnosis not present

## 2020-02-07 DIAGNOSIS — I10 Essential (primary) hypertension: Secondary | ICD-10-CM | POA: Diagnosis not present

## 2020-02-07 DIAGNOSIS — I129 Hypertensive chronic kidney disease with stage 1 through stage 4 chronic kidney disease, or unspecified chronic kidney disease: Secondary | ICD-10-CM | POA: Diagnosis not present

## 2020-02-07 DIAGNOSIS — D509 Iron deficiency anemia, unspecified: Secondary | ICD-10-CM | POA: Diagnosis not present

## 2020-02-20 ENCOUNTER — Ambulatory Visit (HOSPITAL_COMMUNITY)
Admission: RE | Admit: 2020-02-20 | Discharge: 2020-02-20 | Disposition: A | Discharge status: Home / Self Care | Payer: Medicare Other | Source: Ambulatory Visit | Attending: Acute Care | Admitting: Acute Care

## 2020-02-20 ENCOUNTER — Other Ambulatory Visit: Payer: Self-pay

## 2020-02-20 DIAGNOSIS — Z87891 Personal history of nicotine dependence: Secondary | ICD-10-CM

## 2020-02-20 NOTE — Progress Notes (Signed)

## 2020-02-22 ENCOUNTER — Other Ambulatory Visit: Payer: Self-pay | Admitting: *Deleted

## 2020-02-22 DIAGNOSIS — Z87891 Personal history of nicotine dependence: Secondary | ICD-10-CM

## 2020-02-25 ENCOUNTER — Encounter (INDEPENDENT_AMBULATORY_CARE_PROVIDER_SITE_OTHER): Payer: Medicare Other | Admitting: Ophthalmology

## 2020-02-26 NOTE — Progress Notes (Deleted)
Office Visit Note  Patient: Patricia Jackson             Date of Birth: [birth year]           MRN: [id number]             PCP: Celene Squibb, MD Referring: Celene Squibb, MD Visit Date: 03/04/2020 Occupation: @GUAROCC @  Subjective:  No chief complaint on file.   History of Present Illness: Patricia Jackson is a 79 y.o. female ***   Activities of Daily Living:  Patient reports morning stiffness for *** {minute/hour:19697}.   Patient {ACTIONS;DENIES/REPORTS:21021675::"Denies"} nocturnal pain.  Difficulty dressing/grooming: {ACTIONS;DENIES/REPORTS:21021675::"Denies"} Difficulty climbing stairs: {ACTIONS;DENIES/REPORTS:21021675::"Denies"} Difficulty getting out of chair: {ACTIONS;DENIES/REPORTS:21021675::"Denies"} Difficulty using hands for taps, buttons, cutlery, and/or writing: {ACTIONS;DENIES/REPORTS:21021675::"Denies"}  No Rheumatology ROS completed.   PMFS History:  Patient Active Problem List   Diagnosis Date Noted  . 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 Traction detachment of left retina 08/03/2019  . Left retinal detachment 08/01/2019  . Vitreous hemorrhage of left eye ([place]) 08/01/2019  . Exudative age-related macular degeneration of left eye with active choroidal neovascularization ([place]) 08/01/2019  . Retinal hemorrhage of left eye 08/01/2019  . Advanced nonexudative age-related macular degeneration of left eye with subfoveal involvement 08/01/2019  . Total retinal detachment of left eye 07/26/2019  . 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 Thrombocytopenia ([place]) 07/04/2019  . Orthostatic syncope 08/27/2017  . Atypical chest pain 08/27/2017  . Atrial flutter ([place]) 10/28/2016  . CKD (chronic kidney disease), stage IV ([place]) 10/28/2016  . COPD (chronic obstructive pulmonary disease) ([place]) 10/28/2016  . Depression 10/28/2016  . Esophageal dysphagia   . Anemia 04/07/2016  . Sinus bradycardia   . CAD in  native artery 03/16/2016  . S/P angioplasty with stent 03/15/16 DES Resolute, [place] 03/16/2016  . NSTEMI (non-ST elevated myocardial infarction) ([place]) 03/12/2016  . Shortness of breath   . 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[place]) 10/31/2015  . Dyslipidemia 10/31/2015  . Atrial flutter with rapid ventricular response ([place]) 10/30/2015  . Back pain 11/26/2013  . Arm pain 11/26/2013  . Chest pain 11/26/2013  . Hypertension     Past Medical History:  Diagnosis Date  . A-fib ([place])   . Atrial flutter ([place])    On Eliquis in 8/17 but discontinued after stent placement  . Breast cancer ([place])    remote  . CAD in native artery 03/16/2016  . COPD (chronic obstructive pulmonary disease) ([place])   . Hypertension   . S/P angioplasty with stent 03/15/16 DES Resolute, pLCX 03/16/2016    Family History  Problem Relation Age of Onset  . Heart disease Mother   . COPD Father   . Cancer Sister        unknown primary  . Cancer Brother        unknown primary  . Cancer Brother   . Hypertension Son   . Colon cancer Neg Hx    Past Surgical History:  Procedure Laterality Date  . APPENDECTOMY    . BREAST SURGERY Left   . CARDIAC CATHETERIZATION N/A 03/15/2016   Procedure: Left Heart Cath and Coronary Angiography;  Surgeon: Belva Crome, MD;  Location: [facility];  Service: Cardiovascular;  Laterality: N/A;  . CARDIAC CATHETERIZATION N/A 03/15/2016   Procedure: Coronary Stent Intervention;  Surgeon: Belva Crome, MD;  Location: [facility];  Service: Cardiovascular;  Laterality: N/A;  . COLONOSCOPY     remote  .  COLONOSCOPY N/A 05/26/2016   Procedure: COLONOSCOPY;  Surgeon: Daneil Dolin, MD;  [facility];  Service: Endoscopy;  Laterality: N/A;  845  . ESOPHAGOGASTRODUODENOSCOPY N/A 05/26/2016   Procedure: ESOPHAGOGASTRODUODENOSCOPY (EGD);  Surgeon: Daneil Dolin, MD;  [facility];  Service: Endoscopy;  Laterality: N/A;  Venia Minks DILATION N/A 05/26/2016   Procedure: Venia Minks  DILATION;  Surgeon: Daneil Dolin, MD;  [facility];  Service: Endoscopy;  Laterality: N/A;  . VITRECTOMY Left 10/03/2019   Dr. Zadie Rhine, Vitrectomy, Focal Laser, Removal of Silicone Oil   Social History   Social History Narrative   ** Merged History Encounter **       Immunization History  Administered Date(s) Administered  . Influenza,inj,Quad PF,6+ Mos 01/27/2016  . Moderna SARS-COVID-2 Vaccination 06/15/2019, 07/16/2019  . Tdap 09/04/2019     Objective: Vital Signs: There were no vitals taken for this visit.   Physical Exam   Musculoskeletal Exam: ***  CDAI Exam: CDAI Score: -- Patient Global: --; Provider Global: -- Swollen: --; Tender: -- Joint Exam 03/04/2020   No joint exam has been documented for this visit   There is currently no information documented on the homunculus. Go to the Rheumatology activity and complete the homunculus joint exam.  Investigation: No additional findings.  Imaging: CT CHEST LUNG CA SCREEN LOW DOSE W/O CM  Result Date: 02/20/2020 CLINICAL DATA:  Fifty-four pack-year smoking history, quitting 4 years ago. EXAM: CT CHEST WITHOUT CONTRAST LOW-DOSE FOR LUNG CANCER SCREENING TECHNIQUE: Multidetector CT imaging of the chest was performed following the standard protocol without IV contrast. COMPARISON:  02/18/2019 FINDINGS: Cardiovascular: Aortic atherosclerosis. Tortuous thoracic aorta. Normal heart size, without pericardial effusion. Multivessel coronary artery atherosclerosis. Mediastinum/Nodes: Left axillary node dissection. No axillary adenopathy. No mediastinal or definite hilar adenopathy, given limitations of unenhanced CT. Mildly dilated esophagus with fluid level within on 31/2. Lungs/Pleura: No pleural fluid. Moderate centrilobular emphysema. Pulmonary nodules of maximally volume derived equivalent diameter 4.6 mm in the left upper lobe. No suspicious or enlarging pulmonary nodules. Upper Abdomen: Normal imaged portions of the  liver, spleen, stomach, pancreas, gallbladder, adrenal glands. Upper pole left renal 1.3 cm low-density lesion is likely a cyst, similar. Musculoskeletal: Left mastectomy. Osteopenia. Anterior left second and less so third rib sclerosis is new, favored to be related to interval trauma. Mild convex right thoracic spine curvature. IMPRESSION: 1. Lung-RADS 2, benign appearance or behavior. Continue annual screening with low-dose chest CT without contrast in 12 months. 2. New anterior left rib sclerosis, favored to be related to interval trauma. 3. Aortic Atherosclerosis (ICD10-I70.0) and Emphysema (ICD10-J43.9). Coronary artery atherosclerosis. Electronically Signed   By: Abigail Miyamoto M.D.   On: 02/20/2020 15:52    Recent Labs: Lab Results  Component Value Date   WBC 7.3 07/06/2019   HGB 12.4 07/06/2019   PLT 112 (L) 07/06/2019   NA 141 07/06/2019   K 4.5 07/06/2019   CL 114 (H) 07/06/2019   CO2 22 07/06/2019   GLUCOSE 104 (H) 07/06/2019   BUN 26 (H) 07/06/2019   CREATININE 1.53 (H) 07/06/2019   BILITOT 0.5 07/04/2019   ALKPHOS 66 07/04/2019   AST 40 07/04/2019   ALT 27 07/04/2019   PROT 6.8 07/04/2019   ALBUMIN 4.1 07/04/2019   CALCIUM 9.1 07/06/2019   GFRAA 38 (L) 07/06/2019    Speciality Comments: No specialty comments available.  Procedures:  No procedures performed Allergies: Sulfa antibiotics and Sulfa antibiotics   Assessment / Plan:  Visit Diagnoses: No diagnosis found.  Orders: No orders of the defined types were placed in this encounter.  No orders of the defined types were placed in this encounter.   Face-to-face time spent with patient was *** minutes. Greater than 50% of time was spent in counseling and coordination of care.  Follow-Up Instructions: No follow-ups on file.   Bo Merino, MD  Note - This record has been created using Editor, commissioning.  Chart creation errors have been sought, but may not always  have been located. Such creation errors  do not reflect on  the standard of medical care.

## 2020-03-04 ENCOUNTER — Ambulatory Visit: Payer: Medicare Other | Admitting: Rheumatology

## 2020-03-04 DIAGNOSIS — J8410 Pulmonary fibrosis, unspecified: Secondary | ICD-10-CM

## 2020-03-04 DIAGNOSIS — E785 Hyperlipidemia, unspecified: Secondary | ICD-10-CM

## 2020-03-04 DIAGNOSIS — M810 Age-related osteoporosis without current pathological fracture: Secondary | ICD-10-CM

## 2020-03-04 DIAGNOSIS — Z95820 Peripheral vascular angioplasty status with implants and grafts: Secondary | ICD-10-CM

## 2020-03-04 DIAGNOSIS — I214 Non-ST elevation (NSTEMI) myocardial infarction: Secondary | ICD-10-CM

## 2020-03-04 DIAGNOSIS — Z8709 Personal history of other diseases of the respiratory system: Secondary | ICD-10-CM

## 2020-03-04 DIAGNOSIS — D696 Thrombocytopenia, unspecified: Secondary | ICD-10-CM

## 2020-03-04 DIAGNOSIS — H353221 Exudative age-related macular degeneration, left eye, with active choroidal neovascularization: Secondary | ICD-10-CM

## 2020-03-04 DIAGNOSIS — R1319 Other dysphagia: Secondary | ICD-10-CM

## 2020-03-04 DIAGNOSIS — N184 Chronic kidney disease, stage 4 (severe): Secondary | ICD-10-CM

## 2020-03-04 DIAGNOSIS — R001 Bradycardia, unspecified: Secondary | ICD-10-CM

## 2020-03-04 DIAGNOSIS — I4892 Unspecified atrial flutter: Secondary | ICD-10-CM

## 2020-03-04 DIAGNOSIS — I10 Essential (primary) hypertension: Secondary | ICD-10-CM

## 2020-03-04 DIAGNOSIS — R768 Other specified abnormal immunological findings in serum: Secondary | ICD-10-CM

## 2020-03-04 DIAGNOSIS — I7300 Raynaud's syndrome without gangrene: Secondary | ICD-10-CM

## 2020-03-05 ENCOUNTER — Encounter (INDEPENDENT_AMBULATORY_CARE_PROVIDER_SITE_OTHER): Payer: Medicare Other | Admitting: Ophthalmology

## 2020-03-05 DIAGNOSIS — R059 Cough, unspecified: Secondary | ICD-10-CM | POA: Diagnosis not present

## 2020-03-11 ENCOUNTER — Other Ambulatory Visit (HOSPITAL_COMMUNITY): Payer: Medicare Other

## 2020-03-18 ENCOUNTER — Ambulatory Visit (HOSPITAL_COMMUNITY): Payer: Medicare Other | Admitting: Oncology

## 2020-03-31 ENCOUNTER — Other Ambulatory Visit: Payer: Self-pay | Admitting: Cardiology

## 2020-03-31 NOTE — Telephone Encounter (Signed)
Received refill request for Eliquis 40m twice daily.  Reviewed chart.  Most recent labs done 10/24/19 showed SCr 1.36  Hgb 11.2  Hct 35.7  Age 79  Wt. 70.4kg.  Based on above finding Eliquis dose id appropriate.  Refill approved.

## 2020-04-02 ENCOUNTER — Inpatient Hospital Stay (HOSPITAL_COMMUNITY): Payer: Medicare Other

## 2020-04-03 ENCOUNTER — Inpatient Hospital Stay (HOSPITAL_COMMUNITY): Payer: Medicare Other | Attending: Hematology

## 2020-04-03 ENCOUNTER — Other Ambulatory Visit: Payer: Self-pay

## 2020-04-03 DIAGNOSIS — D696 Thrombocytopenia, unspecified: Secondary | ICD-10-CM | POA: Diagnosis not present

## 2020-04-03 LAB — CBC WITH DIFFERENTIAL/PLATELET
Abs Immature Granulocytes: 0.01 10*3/uL (ref 0.00–0.07)
Basophils Absolute: 0 10*3/uL (ref 0.0–0.1)
Basophils Relative: 1 %
Eosinophils Absolute: 0.2 10*3/uL (ref 0.0–0.5)
Eosinophils Relative: 4 %
HCT: 39.2 % (ref 36.0–46.0)
Hemoglobin: 12 g/dL (ref 12.0–15.0)
Immature Granulocytes: 0 %
Lymphocytes Relative: 23 %
Lymphs Abs: 1.1 10*3/uL (ref 0.7–4.0)
MCH: 30.2 pg (ref 26.0–34.0)
MCHC: 30.6 g/dL (ref 30.0–36.0)
MCV: 98.5 fL (ref 80.0–100.0)
Monocytes Absolute: 0.5 10*3/uL (ref 0.1–1.0)
Monocytes Relative: 11 %
Neutro Abs: 2.9 10*3/uL (ref 1.7–7.7)
Neutrophils Relative %: 61 %
Platelets: 117 10*3/uL — ABNORMAL LOW (ref 150–400)
RBC: 3.98 MIL/uL (ref 3.87–5.11)
RDW: 14.1 % (ref 11.5–15.5)
WBC: 4.8 10*3/uL (ref 4.0–10.5)
nRBC: 0 % (ref 0.0–0.2)

## 2020-04-04 DIAGNOSIS — E785 Hyperlipidemia, unspecified: Secondary | ICD-10-CM | POA: Diagnosis not present

## 2020-04-04 DIAGNOSIS — I482 Chronic atrial fibrillation, unspecified: Secondary | ICD-10-CM | POA: Diagnosis not present

## 2020-04-04 DIAGNOSIS — E782 Mixed hyperlipidemia: Secondary | ICD-10-CM | POA: Diagnosis not present

## 2020-04-09 ENCOUNTER — Other Ambulatory Visit: Payer: Self-pay

## 2020-04-09 ENCOUNTER — Inpatient Hospital Stay (HOSPITAL_COMMUNITY): Payer: Medicare Other | Attending: Hematology | Admitting: Hematology

## 2020-04-09 VITALS — BP 125/64 | HR 56 | Temp 97.5°F | Resp 18 | Wt 153.2 lb

## 2020-04-09 DIAGNOSIS — R5383 Other fatigue: Secondary | ICD-10-CM | POA: Diagnosis not present

## 2020-04-09 DIAGNOSIS — Z882 Allergy status to sulfonamides status: Secondary | ICD-10-CM | POA: Insufficient documentation

## 2020-04-09 DIAGNOSIS — M549 Dorsalgia, unspecified: Secondary | ICD-10-CM | POA: Diagnosis not present

## 2020-04-09 DIAGNOSIS — I4892 Unspecified atrial flutter: Secondary | ICD-10-CM | POA: Insufficient documentation

## 2020-04-09 DIAGNOSIS — I10 Essential (primary) hypertension: Secondary | ICD-10-CM | POA: Diagnosis not present

## 2020-04-09 DIAGNOSIS — I7300 Raynaud's syndrome without gangrene: Secondary | ICD-10-CM | POA: Insufficient documentation

## 2020-04-09 DIAGNOSIS — Z836 Family history of other diseases of the respiratory system: Secondary | ICD-10-CM | POA: Insufficient documentation

## 2020-04-09 DIAGNOSIS — Z809 Family history of malignant neoplasm, unspecified: Secondary | ICD-10-CM | POA: Insufficient documentation

## 2020-04-09 DIAGNOSIS — I2510 Atherosclerotic heart disease of native coronary artery without angina pectoris: Secondary | ICD-10-CM | POA: Insufficient documentation

## 2020-04-09 DIAGNOSIS — E559 Vitamin D deficiency, unspecified: Secondary | ICD-10-CM | POA: Diagnosis not present

## 2020-04-09 DIAGNOSIS — Z853 Personal history of malignant neoplasm of breast: Secondary | ICD-10-CM | POA: Insufficient documentation

## 2020-04-09 DIAGNOSIS — R42 Dizziness and giddiness: Secondary | ICD-10-CM | POA: Insufficient documentation

## 2020-04-09 DIAGNOSIS — D696 Thrombocytopenia, unspecified: Secondary | ICD-10-CM | POA: Diagnosis not present

## 2020-04-09 DIAGNOSIS — Z7901 Long term (current) use of anticoagulants: Secondary | ICD-10-CM | POA: Diagnosis not present

## 2020-04-09 DIAGNOSIS — Z87891 Personal history of nicotine dependence: Secondary | ICD-10-CM | POA: Diagnosis not present

## 2020-04-09 DIAGNOSIS — R0602 Shortness of breath: Secondary | ICD-10-CM | POA: Diagnosis not present

## 2020-04-09 DIAGNOSIS — R002 Palpitations: Secondary | ICD-10-CM | POA: Diagnosis not present

## 2020-04-09 DIAGNOSIS — Z8249 Family history of ischemic heart disease and other diseases of the circulatory system: Secondary | ICD-10-CM | POA: Insufficient documentation

## 2020-04-09 DIAGNOSIS — Z9049 Acquired absence of other specified parts of digestive tract: Secondary | ICD-10-CM | POA: Diagnosis not present

## 2020-04-09 DIAGNOSIS — K5909 Other constipation: Secondary | ICD-10-CM | POA: Diagnosis not present

## 2020-04-09 DIAGNOSIS — Z79899 Other long term (current) drug therapy: Secondary | ICD-10-CM | POA: Insufficient documentation

## 2020-04-09 NOTE — Patient Instructions (Signed)
[place] at [facility] Discharge Instructions  You were seen today by Dr. Delton Coombes. He went over your recent results. Dr. Delton Coombes will see you back in 6 months for labs and follow up.   Thank you for choosing [place] at [facility] to provide your oncology and hematology care.  To afford each patient quality time with our provider, please arrive at least 15 minutes before your scheduled appointment time.   If you have a lab appointment with the [facility] please come in thru the Main Entrance and check in at the main information desk  You need to re-schedule your appointment should you arrive 10 or more minutes late.  We strive to give you quality time with our providers, and arriving late affects you and other patients whose appointments are after yours.  Also, if you no show three or more times for appointments you may be dismissed from the clinic at the providers discretion.     Again, thank you for choosing [facility]  Our hope is that these requests will decrease the amount of time that you wait before being seen by our physicians.       _____________________________________________________________  Should you have questions after your visit to [facility], please contact our office at [phone] between the hours of 8:00 a.m. and 4:30 p.m.  Voicemails left after 4:00 p.m. will not be returned until the following business day.  For prescription refill requests, have your pharmacy contact our office and allow 72 hours.    Cancer Center Support Programs:   > Cancer Support Group  2nd Tuesday of the month 1pm-2pm, Journey Room

## 2020-04-09 NOTE — Progress Notes (Signed)
[facility] [place], [place] [address]   CLINIC:  Medical Oncology/Hematology  PCP:  Celene Squibb, MD [address] Patricia Jackson [place] [address]  [phone]  REASON FOR VISIT:  Follow-up for thrombocytopenia  PRIOR THERAPY: None  CURRENT THERAPY: Observation  INTERVAL HISTORY:  [name], a 80 y.o. female, returns for routine follow-up for her thrombocytopenia. Patricia Jackson was last seen on 08/27/2019.  Today she reports feeling okay. She continues having episodes of shooting pain starting in her upper back and radiating into her arms; she takes sublingual nitroglycerin which helps ease the pain. She has easy bruising but denies having any nosebleeds, hematuria or hematochezia. She continues seeing Patricia Jackson in her fingertips and toes which turn white when she is stressed. Her appetite is excellent and she denies having any abdominal pain, though she has chronic constipation. She takes vitamin D 1,000 units and iron tablets daily. She denies having F/C, night sweats, recent infection or weight loss.    REVIEW OF SYSTEMS:  Review of Systems  Constitutional: Positive for fatigue (75%). Negative for appetite change, chills, diaphoresis, fever and unexpected weight change.  HENT:   Negative for nosebleeds.   Respiratory: Positive for shortness of breath.   Cardiovascular: Positive for palpitations.  Gastrointestinal: Positive for constipation (chronic) and nausea. Negative for abdominal pain and blood in stool.  Genitourinary: Negative for hematuria.   Musculoskeletal: Positive for back pain (7/10 upper back and arm pain).  Neurological: Positive for dizziness.       Patricia Jackson in fingertips & toes  Hematological: Bruises/bleeds easily (easy bruising).  All other systems reviewed and are negative.   PAST MEDICAL/SURGICAL HISTORY:  Past Medical History:  Diagnosis Date  . A-fib ([place])   . Atrial flutter ([place])    On Eliquis in 8/17 but  discontinued after stent placement  . Breast cancer ([place])    remote  . CAD in native artery 03/16/2016  . COPD (chronic obstructive pulmonary disease) ([place])   . Hypertension   . S/P angioplasty with stent 03/15/16 DES Resolute, [place] 03/16/2016   Past Surgical History:  Procedure Laterality Date  . APPENDECTOMY    . BREAST SURGERY Left   . CARDIAC CATHETERIZATION N/A 03/15/2016   Procedure: Left Heart Cath and Coronary Angiography;  Surgeon: Belva Crome, MD;  Location: [facility];  Service: Cardiovascular;  Laterality: N/A;  . CARDIAC CATHETERIZATION N/A 03/15/2016   Procedure: Coronary Stent Intervention;  Surgeon: Belva Crome, MD;  Location: [facility];  Service: Cardiovascular;  Laterality: N/A;  . COLONOSCOPY     remote  . COLONOSCOPY N/A 05/26/2016   Procedure: COLONOSCOPY;  Surgeon: Daneil Dolin, MD;  [facility];  Service: Endoscopy;  Laterality: N/A;  845  . ESOPHAGOGASTRODUODENOSCOPY N/A 05/26/2016   Procedure: ESOPHAGOGASTRODUODENOSCOPY (EGD);  Surgeon: Daneil Dolin, MD;  [facility];  Service: Endoscopy;  Laterality: N/A;  Venia Minks DILATION N/A 05/26/2016   Procedure: Venia Minks DILATION;  Surgeon: Daneil Dolin, MD;  [facility];  Service: Endoscopy;  Laterality: N/A;  . VITRECTOMY Left 10/03/2019   Dr. Zadie Rhine, Vitrectomy, Focal Laser, Removal of Silicone Oil    SOCIAL HISTORY:  Social History   Socioeconomic History  . Marital status: Divorced    Spouse name: Not on file  . Number of children: 1  . Years of education: Not on file  . Highest education level: Not on file  Occupational History  .  Occupation: Retired  Tobacco Use  . Smoking status: Former Smoker    Quit date: 12/28/2014    Years since quitting: 5.2  . Smokeless tobacco: Never Used  . Tobacco comment: Patient quit within the last 10 years  Vaping Use  . Vaping Use: Never used  Substance and Sexual Activity  . Alcohol use: Never  . Drug use:  Never  . Sexual activity: Not Currently  Other Topics Concern  . Not on file  Social History Narrative   ** Merged History Encounter **       Social Determinants of Health   Financial Resource Strain: Not on file  Food Insecurity: Not on file  Transportation Needs: Not on file  Physical Activity: Not on file  Stress: Not on file  Social Connections: Not on file  Intimate Partner Violence: Not on file    FAMILY HISTORY:  Family History  Problem Relation Age of Onset  . Heart disease Mother   . COPD Father   . Cancer Sister        unknown primary  . Cancer Brother        unknown primary  . Cancer Brother   . Hypertension Son   . Colon cancer Neg Hx     CURRENT MEDICATIONS:  Current Outpatient Medications  Medication Sig Dispense Refill  . acetaminophen (TYLENOL) 500 MG tablet Take 1,500 mg by mouth every 4 (four) hours as needed.    Marland Kitchen albuterol (PROVENTIL) (2.5 MG/3ML) 0.083% nebulizer solution Take 2.5 mg by nebulization every 6 (six) hours as needed for wheezing or shortness of breath.    Marland Kitchen albuterol (VENTOLIN HFA) 108 (90 Base) MCG/ACT inhaler Inhale 2 puffs into the lungs every 6 (six) hours as needed for wheezing or shortness of breath.    Marland Kitchen amLODipine-olmesartan (AZOR) 10-40 MG tablet Take 1 tablet by mouth every morning.    Marland Kitchen apixaban (ELIQUIS) 5 MG TABS tablet Take 1 tablet (5 mg total) by mouth 2 (two) times daily. 60 tablet 6  . buPROPion (WELLBUTRIN XL) 300 MG 24 hr tablet Take 300 mg by mouth daily.    . Cholecalciferol (VITAMIN D3) 50 MCG (2000 UT) TABS Take 2,000 Units by mouth daily.    Marland Kitchen denosumab (PROLIA) 60 MG/ML SOSY injection Inject 60 mg into the skin every 6 (six) months.    . ferrous sulfate 325 (65 FE) MG tablet Take 325 mg by mouth daily with breakfast.     . Fluticasone-Umeclidin-Vilant (TRELEGY ELLIPTA) 100-62.5-25 MCG/INH AEPB Inhale 1 puff into the lungs daily. 60 each 6  . levothyroxine (SYNTHROID) 50 MCG tablet Take 50 mcg by mouth daily  before breakfast.     . metoprolol succinate (TOPROL-XL) 50 MG 24 hr tablet Take 50 mg by mouth daily.    . nitroGLYCERIN (NITROSTAT) 0.4 MG SL tablet Place 0.4 mg under the tongue every 5 (five) minutes as needed for chest pain.     . rosuvastatin (CRESTOR) 20 MG tablet Take 20 mg by mouth daily.     . sertraline (ZOLOFT) 25 MG tablet Take 25 mg by mouth daily.     No current facility-administered medications for this visit.    ALLERGIES:  Allergies  Allergen Reactions  . Sulfa Antibiotics Hives  . Sulfa Antibiotics Hives and Other (See Comments)    Reaction not recalled by the patient, but "hives" were noted in her other profile in Epic    PHYSICAL EXAM:  Performance status (ECOG): 1 - Symptomatic but completely ambulatory  Vitals:   04/09/20 1108  BP: 125/64  Pulse: (!) 56  Resp: 18  Temp: (!) 97.5 F (36.4 C)  SpO2: 99%   Wt Readings from Last 3 Encounters:  04/09/20 153 lb 3.2 oz (69.5 kg)  01/08/20 155 lb (70.3 kg)  12/28/19 155 lb 3.2 oz (70.4 kg)   Physical Exam Vitals reviewed.  Constitutional:      Appearance: Normal appearance.  Cardiovascular:     Rate and Rhythm: Normal rate and regular rhythm.     Pulses: Normal pulses.     Heart sounds: Normal heart sounds.  Pulmonary:     Effort: Pulmonary effort is normal.     Breath sounds: Normal breath sounds.  Chest:  Breasts:     Right: No axillary adenopathy or supraclavicular adenopathy.     Left: No axillary adenopathy or supraclavicular adenopathy.    Abdominal:     Palpations: Abdomen is soft. There is no mass.     Tenderness: There is no abdominal tenderness.  Lymphadenopathy:     Cervical: No cervical adenopathy.     Upper Body:     Right upper body: No supraclavicular, axillary or pectoral adenopathy.     Left upper body: No supraclavicular, axillary or pectoral adenopathy.  Neurological:     General: No focal deficit present.     Mental Status: She is alert and oriented to person, place, and  time.  Psychiatric:        Mood and Affect: Mood normal.        Behavior: Behavior normal.     LABORATORY DATA:  I have reviewed the labs as listed.  CBC Latest Ref Rng & Units 04/03/2020 07/06/2019 07/04/2019  WBC 4.0 - 10.5 K/uL 4.8 7.3 6.8  Hemoglobin 12.0 - 15.0 g/dL 12.0 12.4 13.0  Hematocrit 36.0 - 46.0 % 39.2 39.7 42.2  Platelets 150 - 400 K/uL 117(L) 112(L) 127(L)   CMP Latest Ref Rng & Units 07/06/2019 07/04/2019 08/27/2017  Glucose 70 - 99 mg/dL 104(H) 94 95  BUN 8 - 23 mg/dL 26(H) 33(H) 30(H)  Creatinine 0.44 - 1.00 mg/dL 1.53(H) 1.84(H) 1.20(H)  Sodium 135 - 145 mmol/L 141 139 141  Potassium 3.5 - 5.1 mmol/L 4.5 4.4 4.7  Chloride 98 - 111 mmol/L 114(H) 109 110  CO2 22 - 32 mmol/L 22 20(L) 25  Calcium 8.9 - 10.3 mg/dL 9.1 8.9 9.1  Total Protein 6.5 - 8.1 g/dL - 6.8 -  Total Bilirubin 0.3 - 1.2 mg/dL - 0.5 -  Alkaline Phos 38 - 126 U/L - 66 -  AST 15 - 41 U/L - 40 -  ALT 0 - 44 U/L - 27 -      Component Value Date/Time   RBC 3.98 04/03/2020 0834   MCV 98.5 04/03/2020 0834   MCH 30.2 04/03/2020 0834   MCHC 30.6 04/03/2020 0834   RDW 14.1 04/03/2020 0834   LYMPHSABS 1.1 04/03/2020 0834   MONOABS 0.5 04/03/2020 0834   EOSABS 0.2 04/03/2020 0834   BASOSABS 0.0 04/03/2020 0834    DIAGNOSTIC IMAGING:  I have independently reviewed the scans and discussed with the patient. No results found.   ASSESSMENT:  1.  Mild thrombocytopenia: -Seen at the request of Dr. Nevada Crane for platelet count of 62.  She has some easy bruising. -She is on Eliquis for atrial fibrillation. -We reviewed labs from 07/04/2019.  Platelet count was 127.  SPEP is negative.  B12, methylmalonic acid, copper and folic acid levels were normal.  LDH  was normal.  White count and platelet count was normal. -ANA was positive with a dilution of 1 and 1280.  Rheumatoid factor is negative.  Hepatitis panel was negative. -She has positive Patricia Jackson phenomena.  Evaluated by rheumatology.  2.  Low vitamin D  levels: -Last level 27.  I have suggested her to take vitamin D 1000 units daily.  3.  Tobacco abuse: -Smoked 2 packs/day for 60 years. -CT chest lung cancer screening scan on 02/20/2020 was lung RADS 2.   PLAN:  1.  Mild thrombocytopenia: -She denies any easy bruising or bleeding. -She was evaluated by rheumatology for positive ANA. -Reviewed CBC from 04/03/2020 with platelet count 117. -RTC 6 months with repeat labs.  2.  Low vitamin D levels: -Continue vitamin D supplements.  Will check vitamin D next visit.  3.  Tobacco abuse: -Plan to repeat CT scan in November this year.   Orders placed this encounter:  Orders Placed This Encounter  Procedures  . CBC with Differential/Platelet  . VITAMIN D 25 Hydroxy (Vit-D Deficiency, Fractures)     Derek Jack, MD [place] [phone]   I, Milinda Antis, am acting as a scribe for Dr. Sanda Linger.  I, Derek Jack MD, have reviewed the above documentation for accuracy and completeness, and I agree with the above.

## 2020-04-10 NOTE — Progress Notes (Deleted)
Office Visit Note  Patient: Patricia Jackson             Date of Birth: 1940/09/01           MRN: [id number]             PCP: Celene Squibb, MD Referring: Celene Squibb, MD Visit Date: 04/24/2020 Occupation: @GUAROCC @  Subjective:  No chief complaint on file.   History of Present Illness: Patricia Jackson is a 80 y.o. female ***   Activities of Daily Living:  Patient reports morning stiffness for *** {minute/hour:19697}.   Patient {ACTIONS;DENIES/REPORTS:21021675::"Denies"} nocturnal pain.  Difficulty dressing/grooming: {ACTIONS;DENIES/REPORTS:21021675::"Denies"} Difficulty climbing stairs: {ACTIONS;DENIES/REPORTS:21021675::"Denies"} Difficulty getting out of chair: {ACTIONS;DENIES/REPORTS:21021675::"Denies"} Difficulty using hands for taps, buttons, cutlery, and/or writing: {ACTIONS;DENIES/REPORTS:21021675::"Denies"}  No Rheumatology ROS completed.   PMFS History:  Patient Active Problem List   Diagnosis Date Noted  . 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 Traction detachment of left retina 08/03/2019  . Left retinal detachment 08/01/2019  . Vitreous hemorrhage of left eye ([place]) 08/01/2019  . Exudative age-related macular degeneration of left eye with active choroidal neovascularization ([place]) 08/01/2019  . Retinal hemorrhage of left eye 08/01/2019  . Advanced nonexudative age-related macular degeneration of left eye with subfoveal involvement 08/01/2019  . Total retinal detachment of left eye 07/26/2019  . 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 Thrombocytopenia ([place]) 07/04/2019  . Orthostatic syncope 08/27/2017  . Atypical chest pain 08/27/2017  . Atrial flutter ([place]) 10/28/2016  . CKD (chronic kidney disease), stage IV ([place]) 10/28/2016  . COPD (chronic obstructive pulmonary disease) ([place]) 10/28/2016  . Depression 10/28/2016  . Esophageal dysphagia   . Anemia 04/07/2016  . Sinus bradycardia   . CAD in  native artery 03/16/2016  . S/P angioplasty with stent 03/15/16 DES Resolute, [place] 03/16/2016  . NSTEMI (non-ST elevated myocardial infarction) ([place]) 03/12/2016  . Shortness of breath   . 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[place]) 10/31/2015  . Dyslipidemia 10/31/2015  . Atrial flutter with rapid ventricular response ([place]) 10/30/2015  . Back pain 11/26/2013  . Arm pain 11/26/2013  . Chest pain 11/26/2013  . Hypertension     Past Medical History:  Diagnosis Date  . A-fib ([place])   . Atrial flutter ([place])    On Eliquis in 8/17 but discontinued after stent placement  . Breast cancer ([place])    remote  . CAD in native artery 03/16/2016  . COPD (chronic obstructive pulmonary disease) ([place])   . Hypertension   . S/P angioplasty with stent 03/15/16 DES Resolute, pLCX 03/16/2016    Family History  Problem Relation Age of Onset  . Heart disease Mother   . COPD Father   . Cancer Sister        unknown primary  . Cancer Brother        unknown primary  . Cancer Brother   . Hypertension Son   . Colon cancer Neg Hx    Past Surgical History:  Procedure Laterality Date  . APPENDECTOMY    . BREAST SURGERY Left   . CARDIAC CATHETERIZATION N/A 03/15/2016   Procedure: Left Heart Cath and Coronary Angiography;  Surgeon: Belva Crome, MD;  Location: [facility];  Service: Cardiovascular;  Laterality: N/A;  . CARDIAC CATHETERIZATION N/A 03/15/2016   Procedure: Coronary Stent Intervention;  Surgeon: Belva Crome, MD;  Location: [facility];  Service: Cardiovascular;  Laterality: N/A;  . COLONOSCOPY     remote  .  COLONOSCOPY N/A 05/26/2016   Procedure: COLONOSCOPY;  Surgeon: Daneil Dolin, MD;  [facility];  Service: Endoscopy;  Laterality: N/A;  845  . ESOPHAGOGASTRODUODENOSCOPY N/A 05/26/2016   Procedure: ESOPHAGOGASTRODUODENOSCOPY (EGD);  Surgeon: Daneil Dolin, MD;  [facility];  Service: Endoscopy;  Laterality: N/A;  Venia Minks DILATION N/A 05/26/2016   Procedure: Venia Minks  DILATION;  Surgeon: Daneil Dolin, MD;  [facility];  Service: Endoscopy;  Laterality: N/A;  . VITRECTOMY Left 10/03/2019   Dr. Zadie Rhine, Vitrectomy, Focal Laser, Removal of Silicone Oil   Social History   Social History Narrative   ** Merged History Encounter **       Immunization History  Administered Date(s) Administered  . Influenza,inj,Quad PF,6+ Mos 01/27/2016  . Moderna Sars-Covid-2 Vaccination 06/15/2019, 07/16/2019  . Tdap 09/04/2019     Objective: Vital Signs: There were no vitals taken for this visit.   Physical Exam   Musculoskeletal Exam: ***  CDAI Exam: CDAI Score: -- Patient Global: --; Provider Global: -- Swollen: --; Tender: -- Joint Exam 04/24/2020   No joint exam has been documented for this visit   There is currently no information documented on the homunculus. Go to the Rheumatology activity and complete the homunculus joint exam.  Investigation: No additional findings.  Imaging: No results found.  Recent Labs: Lab Results  Component Value Date   WBC 4.8 04/03/2020   HGB 12.0 04/03/2020   PLT 117 (L) 04/03/2020   NA 141 07/06/2019   K 4.5 07/06/2019   CL 114 (H) 07/06/2019   CO2 22 07/06/2019   GLUCOSE 104 (H) 07/06/2019   BUN 26 (H) 07/06/2019   CREATININE 1.53 (H) 07/06/2019   BILITOT 0.5 07/04/2019   ALKPHOS 66 07/04/2019   AST 40 07/04/2019   ALT 27 07/04/2019   PROT 6.8 07/04/2019   ALBUMIN 4.1 07/04/2019   CALCIUM 9.1 07/06/2019   GFRAA 38 (L) 07/06/2019    Speciality Comments: No specialty comments available.  Procedures:  No procedures performed Allergies: Sulfa antibiotics and Sulfa antibiotics   Assessment / Plan:     Visit Diagnoses: No diagnosis found.  Orders: No orders of the defined types were placed in this encounter.  No orders of the defined types were placed in this encounter.   Face-to-face time spent with patient was *** minutes. Greater than 50% of time was spent in counseling and  coordination of care.  Follow-Up Instructions: No follow-ups on file.   Bo Merino, MD  Note - This record has been created using Editor, commissioning.  Chart creation errors have been sought, but may not always  have been located. Such creation errors do not reflect on  the standard of medical care.

## 2020-04-24 ENCOUNTER — Ambulatory Visit: Payer: Medicare Other | Admitting: Rheumatology

## 2020-04-24 DIAGNOSIS — I4892 Unspecified atrial flutter: Secondary | ICD-10-CM

## 2020-04-24 DIAGNOSIS — I10 Essential (primary) hypertension: Secondary | ICD-10-CM

## 2020-04-24 DIAGNOSIS — M810 Age-related osteoporosis without current pathological fracture: Secondary | ICD-10-CM

## 2020-04-24 DIAGNOSIS — R768 Other specified abnormal immunological findings in serum: Secondary | ICD-10-CM

## 2020-04-24 DIAGNOSIS — H353221 Exudative age-related macular degeneration, left eye, with active choroidal neovascularization: Secondary | ICD-10-CM

## 2020-04-24 DIAGNOSIS — E785 Hyperlipidemia, unspecified: Secondary | ICD-10-CM

## 2020-04-24 DIAGNOSIS — D696 Thrombocytopenia, unspecified: Secondary | ICD-10-CM

## 2020-04-24 DIAGNOSIS — N184 Chronic kidney disease, stage 4 (severe): Secondary | ICD-10-CM

## 2020-04-24 DIAGNOSIS — Z8709 Personal history of other diseases of the respiratory system: Secondary | ICD-10-CM

## 2020-04-24 DIAGNOSIS — Z95820 Peripheral vascular angioplasty status with implants and grafts: Secondary | ICD-10-CM

## 2020-04-24 DIAGNOSIS — R1319 Other dysphagia: Secondary | ICD-10-CM

## 2020-04-24 DIAGNOSIS — I214 Non-ST elevation (NSTEMI) myocardial infarction: Secondary | ICD-10-CM

## 2020-04-24 DIAGNOSIS — J8410 Pulmonary fibrosis, unspecified: Secondary | ICD-10-CM

## 2020-04-24 DIAGNOSIS — I2510 Atherosclerotic heart disease of native coronary artery without angina pectoris: Secondary | ICD-10-CM

## 2020-04-24 DIAGNOSIS — I7300 Raynaud's syndrome without gangrene: Secondary | ICD-10-CM

## 2020-04-26 ENCOUNTER — Emergency Department (HOSPITAL_COMMUNITY)
Admission: EM | Admit: 2020-04-26 | Discharge: 2020-04-26 | Disposition: A | Discharge status: Home / Self Care | Payer: Medicare Other | Attending: Emergency Medicine | Admitting: Emergency Medicine

## 2020-04-26 ENCOUNTER — Encounter (HOSPITAL_COMMUNITY): Payer: Self-pay | Admitting: *Deleted

## 2020-04-26 ENCOUNTER — Other Ambulatory Visit: Payer: Self-pay

## 2020-04-26 ENCOUNTER — Emergency Department (HOSPITAL_COMMUNITY): Payer: Medicare Other

## 2020-04-26 DIAGNOSIS — Z7901 Long term (current) use of anticoagulants: Secondary | ICD-10-CM | POA: Diagnosis not present

## 2020-04-26 DIAGNOSIS — R21 Rash and other nonspecific skin eruption: Secondary | ICD-10-CM | POA: Diagnosis not present

## 2020-04-26 DIAGNOSIS — Z853 Personal history of malignant neoplasm of breast: Secondary | ICD-10-CM | POA: Insufficient documentation

## 2020-04-26 DIAGNOSIS — Z955 Presence of coronary angioplasty implant and graft: Secondary | ICD-10-CM | POA: Insufficient documentation

## 2020-04-26 DIAGNOSIS — Z79899 Other long term (current) drug therapy: Secondary | ICD-10-CM | POA: Insufficient documentation

## 2020-04-26 DIAGNOSIS — Z7951 Long term (current) use of inhaled steroids: Secondary | ICD-10-CM | POA: Insufficient documentation

## 2020-04-26 DIAGNOSIS — B9789 Other viral agents as the cause of diseases classified elsewhere: Secondary | ICD-10-CM | POA: Diagnosis not present

## 2020-04-26 DIAGNOSIS — Z87891 Personal history of nicotine dependence: Secondary | ICD-10-CM | POA: Insufficient documentation

## 2020-04-26 DIAGNOSIS — I10 Essential (primary) hypertension: Secondary | ICD-10-CM | POA: Diagnosis not present

## 2020-04-26 DIAGNOSIS — R059 Cough, unspecified: Secondary | ICD-10-CM | POA: Diagnosis not present

## 2020-04-26 DIAGNOSIS — I4891 Unspecified atrial fibrillation: Secondary | ICD-10-CM | POA: Insufficient documentation

## 2020-04-26 DIAGNOSIS — I2510 Atherosclerotic heart disease of native coronary artery without angina pectoris: Secondary | ICD-10-CM | POA: Insufficient documentation

## 2020-04-26 DIAGNOSIS — U071 COVID-19: Secondary | ICD-10-CM | POA: Diagnosis not present

## 2020-04-26 DIAGNOSIS — J069 Acute upper respiratory infection, unspecified: Secondary | ICD-10-CM | POA: Insufficient documentation

## 2020-04-26 DIAGNOSIS — J439 Emphysema, unspecified: Secondary | ICD-10-CM | POA: Diagnosis not present

## 2020-04-26 DIAGNOSIS — J449 Chronic obstructive pulmonary disease, unspecified: Secondary | ICD-10-CM | POA: Diagnosis not present

## 2020-04-26 MED ORDER — DOXYCYCLINE HYCLATE 100 MG PO CAPS: 100.0000 mg | ORAL_CAPSULE | Freq: Two times a day (BID) | ORAL | 0 refills | Status: DC

## 2020-04-26 MED ORDER — BENZONATATE 100 MG PO CAPS: 100.0000 mg | ORAL_CAPSULE | Freq: Three times a day (TID) | ORAL | 0 refills | Status: DC

## 2020-04-26 NOTE — ED Provider Notes (Signed)
[facility] EMERGENCY DEPARTMENT Provider Note  CSN: [id number] Arrival date & time: 04/26/20 1937    History Chief Complaint  Patient presents with  . Cough    HPI  Patricia Jackson is a 80 y.o. female with history of multiple medical problems including COPD has had 3 days of nasal congestion and cough, noted to have a fever at home today prior to arrival. Reports some nausea and decreased sense of smell. No significant SOB and no chest pains. She had some post-tussive emesis. No diarrhea. She is vaccinated for Covid.    Past Medical History:  Diagnosis Date  . A-fib ([place])   . Atrial flutter ([place])    On Eliquis in 8/17 but discontinued after stent placement  . Breast cancer ([place])    remote  . CAD in native artery 03/16/2016  . COPD (chronic obstructive pulmonary disease) ([place])   . Hypertension   . S/P angioplasty with stent 03/15/16 DES Resolute, [place] 03/16/2016    Past Surgical History:  Procedure Laterality Date  . APPENDECTOMY    . BREAST SURGERY Left   . CARDIAC CATHETERIZATION N/A 03/15/2016   Procedure: Left Heart Cath and Coronary Angiography;  Surgeon: Belva Crome, MD;  Location: [facility];  Service: Cardiovascular;  Laterality: N/A;  . CARDIAC CATHETERIZATION N/A 03/15/2016   Procedure: Coronary Stent Intervention;  Surgeon: Belva Crome, MD;  Location: [facility];  Service: Cardiovascular;  Laterality: N/A;  . COLONOSCOPY     remote  . COLONOSCOPY N/A 05/26/2016   Procedure: COLONOSCOPY;  Surgeon: Daneil Dolin, MD;  [facility];  Service: Endoscopy;  Laterality: N/A;  845  . ESOPHAGOGASTRODUODENOSCOPY N/A 05/26/2016   Procedure: ESOPHAGOGASTRODUODENOSCOPY (EGD);  Surgeon: Daneil Dolin, MD;  [facility];  Service: Endoscopy;  Laterality: N/A;  Venia Minks DILATION N/A 05/26/2016   Procedure: Venia Minks DILATION;  Surgeon: Daneil Dolin, MD;  [facility];  Service: Endoscopy;  Laterality: N/A;  . VITRECTOMY  Left 10/03/2019   Dr. Zadie Rhine, Vitrectomy, Focal Laser, Removal of Silicone Oil    Family History  Problem Relation Age of Onset  . Heart disease Mother   . COPD Father   . Cancer Sister        unknown primary  . Cancer Brother        unknown primary  . Cancer Brother   . Hypertension Son   . Colon cancer Neg Hx     Social History   Tobacco Use  . Smoking status: Former Smoker    Quit date: 12/28/2014    Years since quitting: 5.3  . Smokeless tobacco: Never Used  . Tobacco comment: Patient quit within the last 10 years  Vaping Use  . Vaping Use: Never used  Substance Use Topics  . Alcohol use: Never  . Drug use: Never     Home Medications Prior to Admission medications   Medication Sig Start Date End Date Taking? Authorizing Provider  benzonatate (TESSALON) 100 MG capsule Take 1 capsule (100 mg total) by mouth every 8 (eight) hours. 04/26/20  Yes Truddie Hidden, MD  doxycycline (VIBRAMYCIN) 100 MG capsule Take 1 capsule (100 mg total) by mouth 2 (two) times daily. 04/26/20  Yes Truddie Hidden, MD  acetaminophen (TYLENOL) 500 MG tablet Take 1,500 mg by mouth every 4 (four) hours as needed.    [provider]  albuterol (PROVENTIL) (2.5 MG/3ML) 0.083% nebulizer solution Take 2.5 mg by nebulization every  6 (six) hours as needed for wheezing or shortness of breath.    [provider]  albuterol (VENTOLIN HFA) 108 (90 Base) MCG/ACT inhaler Inhale 2 puffs into the lungs every 6 (six) hours as needed for wheezing or shortness of breath.    [provider]  amLODipine-olmesartan (AZOR) 10-40 MG tablet Take 1 tablet by mouth every morning. 10/03/19   [provider]  apixaban (ELIQUIS) 5 MG TABS tablet Take 1 tablet (5 mg total) by mouth 2 (two) times daily. 03/31/20   Arnoldo Lenis, MD  buPROPion (WELLBUTRIN XL) 300 MG 24 hr tablet Take 300 mg by mouth daily. 11/05/19   [provider]  Cholecalciferol (VITAMIN D3) 50 MCG (2000  UT) TABS Take 2,000 Units by mouth daily.    [provider]  denosumab (PROLIA) 60 MG/ML SOSY injection Inject 60 mg into the skin every 6 (six) months.    [provider]  ferrous sulfate 325 (65 FE) MG tablet Take 325 mg by mouth daily with breakfast.  04/08/16   [provider]  Fluticasone-Umeclidin-Vilant (TRELEGY ELLIPTA) 100-62.5-25 MCG/INH AEPB Inhale 1 puff into the lungs daily. 07/29/17   Marshell Garfinkel, MD  levothyroxine (SYNTHROID) 50 MCG tablet Take 50 mcg by mouth daily before breakfast.  09/20/19   [provider]  metoprolol succinate (TOPROL-XL) 50 MG 24 hr tablet Take 50 mg by mouth daily. 10/15/19   [provider]  nitroGLYCERIN (NITROSTAT) 0.4 MG SL tablet Place 0.4 mg under the tongue every 5 (five) minutes as needed for chest pain.  06/09/19   [provider]  rosuvastatin (CRESTOR) 20 MG tablet Take 20 mg by mouth daily.  02/26/17   [provider]  sertraline (ZOLOFT) 25 MG tablet Take 25 mg by mouth daily.    [provider]     Allergies    Sulfa antibiotics and Sulfa antibiotics   Review of Systems   Review of Systems A comprehensive review of systems was completed and negative except as noted in HPI.    Physical Exam BP 102/89 (BP Location: Right Arm)   Pulse 79   Temp 98.2 F (36.8 C) (Oral)   Resp 20   Ht 5' 9"  (1.753 m)   Wt 66.2 kg   SpO2 99%   BMI 21.56 kg/m   Physical Exam Vitals and nursing note reviewed.  Constitutional:      Appearance: Normal appearance.  HENT:     Head: Normocephalic and atraumatic.     Nose: Nose normal.     Mouth/Throat:     Mouth: Mucous membranes are moist.  Eyes:     Extraocular Movements: Extraocular movements intact.     Conjunctiva/sclera: Conjunctivae normal.  Cardiovascular:     Rate and Rhythm: Normal rate.  Pulmonary:     Effort: Pulmonary effort is normal.     Breath sounds: Normal breath sounds. No wheezing, rhonchi or rales.   Abdominal:     General: Abdomen is flat.     Palpations: Abdomen is soft.     Tenderness: There is no abdominal tenderness.  Musculoskeletal:        General: No swelling. Normal range of motion.     Cervical back: Neck supple.  Skin:    General: Skin is warm and dry.  Neurological:     General: No focal deficit present.     Mental Status: She is alert.  Psychiatric:        Mood and Affect: Mood normal.  ED Results / Procedures / Treatments   Labs (all labs ordered are listed, but only abnormal results are displayed) Labs Reviewed - No data to display  EKG None  Radiology DG Chest Portable 1 View  Result Date: 04/26/2020 CLINICAL DATA:  80 year old female with cough. COPD. EXAM: PORTABLE CHEST 1 VIEW COMPARISON:  Chest radiograph dated 07/06/2019 and CT dated 02/20/2020 FINDINGS: Emphysema. No focal consolidation, pleural effusion, pneumothorax. The cardiac silhouette is within limits. Coronary vascular calcification or stent. Atherosclerotic calcification of the aorta. Osteopenia with degenerative changes of the spine. No acute osseous pathology. IMPRESSION: No active cardiopulmonary disease. Electronically Signed   By: Anner Crete M.D.   On: 04/26/2020 20:43    Procedures Procedures  Medications Ordered in the ED Medications - No data to display   MDM Rules/Calculators/A&P MDM Patient with URI symptoms, but history of COPD with change in sputum. No hypoxia or fever in the ED. CXR is clear. Exam is benign. May have covid vs early bacterial bronchitis. Will swab for covid, Rx for doxycycline and tessalon. Recommend OTC meds for symptoms relief. ED Course  I have reviewed the triage vital signs and the nursing notes.  Pertinent labs & imaging results that were available during my care of the patient were reviewed by me and considered in my medical decision making (see chart for details).     Final Clinical Impression(s) / ED Diagnoses Final diagnoses:   Viral URI with cough    Rx / DC Orders ED Discharge Orders         Ordered    benzonatate (TESSALON) 100 MG capsule  Every 8 hours        04/26/20 2201    doxycycline (VIBRAMYCIN) 100 MG capsule  2 times daily        04/26/20 2201           Truddie Hidden, MD 04/26/20 2202

## 2020-04-26 NOTE — ED Notes (Signed)
ED Provider at bedside. 

## 2020-04-26 NOTE — ED Triage Notes (Signed)
Pt with emesis, cough and fever since yesterday.  Fever as high as 102

## 2020-04-27 ENCOUNTER — Other Ambulatory Visit: Payer: Self-pay

## 2020-04-27 ENCOUNTER — Encounter (HOSPITAL_COMMUNITY): Payer: Self-pay

## 2020-04-27 DIAGNOSIS — Z853 Personal history of malignant neoplasm of breast: Secondary | ICD-10-CM | POA: Insufficient documentation

## 2020-04-27 DIAGNOSIS — R059 Cough, unspecified: Secondary | ICD-10-CM | POA: Diagnosis present

## 2020-04-27 DIAGNOSIS — N184 Chronic kidney disease, stage 4 (severe): Secondary | ICD-10-CM | POA: Diagnosis not present

## 2020-04-27 DIAGNOSIS — Z7951 Long term (current) use of inhaled steroids: Secondary | ICD-10-CM | POA: Insufficient documentation

## 2020-04-27 DIAGNOSIS — R0602 Shortness of breath: Secondary | ICD-10-CM | POA: Diagnosis not present

## 2020-04-27 DIAGNOSIS — I2510 Atherosclerotic heart disease of native coronary artery without angina pectoris: Secondary | ICD-10-CM | POA: Insufficient documentation

## 2020-04-27 DIAGNOSIS — Z87891 Personal history of nicotine dependence: Secondary | ICD-10-CM | POA: Insufficient documentation

## 2020-04-27 DIAGNOSIS — I129 Hypertensive chronic kidney disease with stage 1 through stage 4 chronic kidney disease, or unspecified chronic kidney disease: Secondary | ICD-10-CM | POA: Diagnosis not present

## 2020-04-27 DIAGNOSIS — Z7901 Long term (current) use of anticoagulants: Secondary | ICD-10-CM | POA: Diagnosis not present

## 2020-04-27 DIAGNOSIS — J449 Chronic obstructive pulmonary disease, unspecified: Secondary | ICD-10-CM | POA: Diagnosis not present

## 2020-04-27 DIAGNOSIS — Z955 Presence of coronary angioplasty implant and graft: Secondary | ICD-10-CM | POA: Diagnosis not present

## 2020-04-27 DIAGNOSIS — Z79899 Other long term (current) drug therapy: Secondary | ICD-10-CM | POA: Diagnosis not present

## 2020-04-27 DIAGNOSIS — I4891 Unspecified atrial fibrillation: Secondary | ICD-10-CM | POA: Insufficient documentation

## 2020-04-27 DIAGNOSIS — U071 COVID-19: Secondary | ICD-10-CM | POA: Diagnosis not present

## 2020-04-27 DIAGNOSIS — J439 Emphysema, unspecified: Secondary | ICD-10-CM | POA: Diagnosis not present

## 2020-04-27 LAB — SARS CORONAVIRUS 2 (TAT 6-24 HRS): SARS Coronavirus 2: POSITIVE — AB

## 2020-04-27 NOTE — ED Triage Notes (Signed)
Pt to er, pt states that she was here yesterday and got her result today that she is covid positive, states that she is here because she has a cough and her ribs hurt from coughing.  Pt talking in full sentences, resps even and unlabored.

## 2020-04-28 ENCOUNTER — Telehealth: Payer: Self-pay | Admitting: Physician Assistant

## 2020-04-28 ENCOUNTER — Emergency Department (HOSPITAL_COMMUNITY)
Admission: EM | Admit: 2020-04-28 | Discharge: 2020-04-28 | Disposition: A | Discharge status: Home / Self Care | Payer: Medicare Other | Attending: Emergency Medicine | Admitting: Emergency Medicine

## 2020-04-28 ENCOUNTER — Emergency Department (HOSPITAL_COMMUNITY): Payer: Medicare Other

## 2020-04-28 DIAGNOSIS — U071 COVID-19: Secondary | ICD-10-CM

## 2020-04-28 DIAGNOSIS — R0602 Shortness of breath: Secondary | ICD-10-CM | POA: Diagnosis not present

## 2020-04-28 DIAGNOSIS — R058 Other specified cough: Secondary | ICD-10-CM

## 2020-04-28 DIAGNOSIS — J439 Emphysema, unspecified: Secondary | ICD-10-CM | POA: Diagnosis not present

## 2020-04-28 MED ORDER — PREDNISONE 50 MG PO TABS: 50.0000 mg | ORAL_TABLET | Freq: Every day | ORAL | 0 refills | Status: DC

## 2020-04-28 MED ORDER — BENZONATATE 100 MG PO CAPS
100.0000 mg | ORAL_CAPSULE | Freq: Once | ORAL | Status: AC
  Administered 2020-04-28: 100 mg via ORAL
  Filled 2020-04-28: qty 1

## 2020-04-28 MED ORDER — PREDNISONE 50 MG PO TABS
60.0000 mg | ORAL_TABLET | Freq: Once | ORAL | Status: AC
  Administered 2020-04-28: 60 mg via ORAL
  Filled 2020-04-28: qty 1

## 2020-04-28 MED ORDER — FLUVOXAMINE MALEATE 100 MG PO TABS: 100.0000 mg | ORAL_TABLET | Freq: Two times a day (BID) | ORAL | 0 refills | Status: DC

## 2020-04-28 MED ORDER — ALBUTEROL SULFATE HFA 108 (90 BASE) MCG/ACT IN AERS
2.0000 | INHALATION_SPRAY | Freq: Once | RESPIRATORY_TRACT | Status: AC
  Administered 2020-04-28: 2 via RESPIRATORY_TRACT
  Filled 2020-04-28: qty 6.7

## 2020-04-28 NOTE — ED Provider Notes (Signed)
[facility] Provider Note   CSN: [id number] Arrival date & time: 04/27/20  1703   History Chief Complaint  Patient presents with  . Cough    Patricia Jackson is a 80 y.o. female.  The history is provided by the patient.  Cough She has history of hypertension, COPD, atrial fibrillation anticoagulated on apixaban and comes in with ongoing cough and dyspnea.  She was in the ED yesterday and diagnosed with COVID-19, discharged with prescriptions for benzonatate and doxycycline.  Her prescriptions were not able to be filled today and she will be picking them up tomorrow.  She continues to have a nonproductive cough which is getting worse.  Dyspnea is essentially unchanged.  She has not run a fever but has had some chills and sweats.  She denies any body aches.  She has lost her sense of smell.  Symptoms started about 4 days ago.  Past Medical History:  Diagnosis Date  . A-fib ([place])   . Atrial flutter ([place])    On Eliquis in 8/17 but discontinued after stent placement  . Breast cancer ([place])    remote  . CAD in native artery 03/16/2016  . COPD (chronic obstructive pulmonary disease) ([place])   . Hypertension   . S/P angioplasty with stent 03/15/16 DES Resolute, pLCX 03/16/2016    Patient Active Problem List   Diagnosis Date Noted  . 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 Traction detachment of left retina 08/03/2019  . Left retinal detachment 08/01/2019  . Vitreous hemorrhage of left eye ([place]) 08/01/2019  . 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 Retinal hemorrhage of left eye 08/01/2019  . Advanced nonexudative age-related macular degeneration of left eye with subfoveal involvement 08/01/2019  . Total retinal detachment of left eye 07/26/2019  . 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 Thrombocytopenia ([place]) 07/04/2019  .  Orthostatic syncope 08/27/2017  . Atypical chest pain 08/27/2017  . Atrial flutter ([place]) 10/28/2016  . 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 COPD (chronic obstructive pulmonary disease) ([place]) 10/28/2016  . Depression 10/28/2016  . Esophageal dysphagia   . Anemia 04/07/2016  . Sinus bradycardia   . CAD in native artery 03/16/2016  . S/P angioplasty with stent 03/15/16 DES Resolute, [place] 03/16/2016  . NSTEMI (non-ST elevated myocardial infarction) ([place]) 03/12/2016  . Shortness of breath   . 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[place]) 10/31/2015  . Dyslipidemia 10/31/2015  . Atrial flutter with rapid ventricular response ([place]) 10/30/2015  . Back pain 11/26/2013  . Arm pain 11/26/2013  . Chest pain 11/26/2013  . Hypertension     Past Surgical History:  Procedure Laterality Date  . APPENDECTOMY    . BREAST SURGERY Left   . CARDIAC CATHETERIZATION N/A 03/15/2016   Procedure: Left Heart Cath and Coronary Angiography;  Surgeon: Belva Crome, MD;  Location: [facility];  Service: Cardiovascular;  Laterality: N/A;  . CARDIAC CATHETERIZATION N/A 03/15/2016   Procedure: Coronary Stent Intervention;  Surgeon: Belva Crome, MD;  Location: [facility];  Service: Cardiovascular;  Laterality: N/A;  . COLONOSCOPY     remote  . COLONOSCOPY N/A 05/26/2016   Procedure: COLONOSCOPY;  Surgeon: Daneil Dolin, MD;  [facility];  Service: Endoscopy;  Laterality: N/A;  845  . ESOPHAGOGASTRODUODENOSCOPY N/A 05/26/2016   Procedure: ESOPHAGOGASTRODUODENOSCOPY (EGD);  Surgeon: Daneil Dolin, MD;  [facility];  Service: Endoscopy;  Laterality: N/A;  Venia Minks DILATION N/A 05/26/2016   Procedure: Venia Minks DILATION;  Surgeon: Daneil Dolin, MD;  [facility];  Service: Endoscopy;  Laterality: N/A;  . VITRECTOMY Left 10/03/2019   Dr. Zadie Rhine, Vitrectomy, Focal Laser, Removal of Silicone Oil     OB History   No obstetric history on file.     Family  History  Problem Relation Age of Onset  . Heart disease Mother   . COPD Father   . Cancer Sister        unknown primary  . Cancer Brother        unknown primary  . Cancer Brother   . Hypertension Son   . Colon cancer Neg Hx     Social History   Tobacco Use  . Smoking status: Former Smoker    Quit date: 12/28/2014    Years since quitting: 5.3  . Smokeless tobacco: Never Used  . Tobacco comment: Patient quit within the last 10 years  Vaping Use  . Vaping Use: Never used  Substance Use Topics  . Alcohol use: Never  . Drug use: Never    Home Medications Prior to Admission medications   Medication Sig Start Date End Date Taking? Authorizing Provider  acetaminophen (TYLENOL) 500 MG tablet Take 1,500 mg by mouth every 4 (four) hours as needed.    [provider]  albuterol (PROVENTIL) (2.5 MG/3ML) 0.083% nebulizer solution Take 2.5 mg by nebulization every 6 (six) hours as needed for wheezing or shortness of breath.    [provider]  albuterol (VENTOLIN HFA) 108 (90 Base) MCG/ACT inhaler Inhale 2 puffs into the lungs every 6 (six) hours as needed for wheezing or shortness of breath.    [provider]  amLODipine-olmesartan (AZOR) 10-40 MG tablet Take 1 tablet by mouth every morning. 10/03/19   [provider]  apixaban (ELIQUIS) 5 MG TABS tablet Take 1 tablet (5 mg total) by mouth 2 (two) times daily. 03/31/20   Arnoldo Lenis, MD  benzonatate (TESSALON) 100 MG capsule Take 1 capsule (100 mg total) by mouth every 8 (eight) hours. 04/26/20   Truddie Hidden, MD  buPROPion (WELLBUTRIN XL) 300 MG 24 hr tablet Take 300 mg by mouth daily. 11/05/19   [provider]  Cholecalciferol (VITAMIN D3) 50 MCG (2000 UT) TABS Take 2,000 Units by mouth daily.    [provider]  denosumab (PROLIA) 60 MG/ML SOSY injection Inject 60 mg into the skin every 6 (six) months.    [provider]  doxycycline (VIBRAMYCIN) 100 MG capsule Take  1 capsule (100 mg total) by mouth 2 (two) times daily. 04/26/20   Truddie Hidden, MD  ferrous sulfate 325 (65 FE) MG tablet Take 325 mg by mouth daily with breakfast.  04/08/16   [provider]  Fluticasone-Umeclidin-Vilant (TRELEGY ELLIPTA) 100-62.5-25 MCG/INH AEPB Inhale 1 puff into the lungs daily. 07/29/17   Marshell Garfinkel, MD  levothyroxine (SYNTHROID) 50 MCG tablet Take 50 mcg by mouth daily before breakfast.  09/20/19   [provider]  metoprolol succinate (TOPROL-XL) 50 MG 24 hr tablet Take 50 mg by mouth daily. 10/15/19   [provider]  nitroGLYCERIN (NITROSTAT) 0.4 MG SL tablet Place 0.4 mg under the tongue every 5 (five) minutes as needed for chest pain.  06/09/19   [provider]  rosuvastatin (CRESTOR) 20 MG tablet Take 20 mg by mouth daily.  02/26/17   [provider]  sertraline (ZOLOFT) 25 MG tablet Take 25 mg by mouth daily.    [provider]    Allergies    Sulfa antibiotics and Sulfa antibiotics  Review of Systems   Review of Systems  Respiratory: Positive for cough.   All other systems reviewed and are negative.   Physical Exam Updated Vital Signs BP (!) 107/50 (BP Location: Left Arm)   Pulse 70   Temp 97.9 F (36.6 C) (Oral)   Resp 18   Ht 5' 9"  (1.753 m)   Wt 66.7 kg   SpO2 100%   BMI 21.71 kg/m   Physical Exam Vitals and nursing note reviewed.   80 year old female, resting comfortably and in no acute distress. Vital signs are normal. Oxygen saturation is 100%, which is normal. Head is normocephalic and atraumatic. PERRLA, EOMI. Oropharynx is clear. Neck is nontender and supple without adenopathy or JVD. Back is nontender and there is no CVA tenderness. Lungs have slight wheezing noted with cough, but otherwise no rales or wheezes or rhonchi. Chest is nontender. Heart has regular rate and rhythm without murmur. Abdomen is soft, flat, nontender without masses or hepatosplenomegaly and peristalsis  is normoactive. Extremities have no cyanosis or edema, full range of motion is present. Skin is warm and dry without rash. Neurologic: Mental status is normal, cranial nerves are intact, there are no motor or sensory deficits.  ED Results / Procedures / Treatments    Radiology DG Chest 2 View  Result Date: 04/28/2020 CLINICAL DATA:  Shortness of breath.  COVID positive. EXAM: CHEST - 2 VIEW COMPARISON:  04/26/2020 FINDINGS: Emphysematous changes and scattered fibrosis in the lungs. Normal heart size and pulmonary vascularity. Calcified granuloma in the right apex. No consolidation or airspace disease. No pleural effusions. No pneumothorax. Mediastinal contours appear intact. Calcification of the aorta. Left mastectomy with surgical clips in the left axilla. IMPRESSION: Emphysematous changes and fibrosis in the lungs. No evidence of active pulmonary disease. Electronically Signed   By: Lucienne Capers M.D.   On: 04/28/2020 01:14   DG Chest Portable 1 View  Result Date: 04/26/2020 CLINICAL DATA:  80 year old female with cough. COPD. EXAM: PORTABLE CHEST 1 VIEW COMPARISON:  Chest radiograph dated 07/06/2019 and CT dated 02/20/2020 FINDINGS: Emphysema. No focal consolidation, pleural effusion, pneumothorax. The cardiac silhouette is within limits. Coronary vascular calcification or stent. Atherosclerotic calcification of the aorta. Osteopenia with degenerative changes of the spine. No acute osseous pathology. IMPRESSION: No active cardiopulmonary disease. Electronically Signed   By: Anner Crete M.D.   On: 04/26/2020 20:43    Procedures Procedures   Medications Ordered in ED Medications  predniSONE (DELTASONE) tablet 60 mg (has no administration in time range)  benzonatate (TESSALON) capsule 100 mg (has no administration in time range)  albuterol (VENTOLIN HFA) 108 (90 Base) MCG/ACT inhaler 2 puff (2 puffs Inhalation Given 04/28/20 0159)    ED Course  I have reviewed the triage vital  signs and the nursing notes.  Pertinent imaging results that were available during my care of the patient were reviewed by me and considered in my medical decision making (see chart for details).  MDM Rules/Calculators/A&P COVID-19 infection with persistent cough and dyspnea.  In spite of dyspnea, oxygen saturation is excellent at 100%.  Chest x-ray shows no new infiltrates.  Old records are reviewed confirming ED visit yesterday with diagnosis of COVID-19.  She does have risk factors of hypertension and COPD, so I will refer her to the Covid  treatment clinic.  I have also offered her fluvoxamine with the understanding that it has not been approved for use in COVID-19.  She does desire that prescription be given.  She will be given therapeutic trial of albuterol via inhaler.  There is slight improvement in her cough following albuterol, so she is advised to continue using that as needed.  Since she had response from albuterol, will also put her on steroids as there may be a COPD exacerbation which is complicating her [id number]-55.  She is given a dose of prednisone and sent home with prescription for prednisone.  Advised to return if her breathing is getting worse.  Final Clinical Impression(s) / ED Diagnoses Final diagnoses:  QITUY-29 virus infection  Nonproductive cough    Rx / DC Orders ED Discharge Orders         Ordered    fluvoxaMINE (LUVOX) 100 MG tablet  2 times daily        04/28/20 0154    Ambulatory referral for Covid Treatment        04/28/20 0154    predniSONE (DELTASONE) 50 MG tablet  Daily        04/28/20 0379           Delora Fuel, MD 55/83/16 [phone]

## 2020-04-28 NOTE — Discharge Instructions (Addendum)
Take the medications which were prescribed for you at your previous ED visit (doxycycline and benzonatate).  Use the inhaler every 4 hours as needed to help with your cough.  Your information has been sent to our Covid treatment clinic.  If they feel that she would benefit from the treatments they have to offer, they will contact you.  Return to the emergency department if you feel your breathing is getting worse.  You should continue to quarantine until your symptoms have resolved and you have 2 days which you feel are normal.

## 2020-04-28 NOTE — Telephone Encounter (Signed)
Called to discuss with patient about COVID-19 symptoms and the use of one of the available treatments for those with mild to moderate Covid symptoms and at a high risk of hospitalization.  Pt appears to qualify for outpatient treatment due to co-morbid conditions and/or a member of an at-risk group in accordance with the FDA Emergency Use Authorization.    Symptom onset: 1/19 (pt was not sure though)  Vaccinated: yes (received 2 dose) Booster? No Immunocompromised? Yes  Qualifiers: age, HTN, AFib, COPD and Hx of breast cancer  Pt is qualified for the monoclonal antibody infusion, but declined. Symptoms tier reviewed as well as criteria for ending isolation. Preventative practices reviewed. Patient verbalized understanding.  She will call us if interested.   [place], [place]  04/28/2020 8:30 AM

## 2020-05-20 DIAGNOSIS — D509 Iron deficiency anemia, unspecified: Secondary | ICD-10-CM | POA: Diagnosis not present

## 2020-05-20 DIAGNOSIS — J45991 Cough variant asthma: Secondary | ICD-10-CM | POA: Diagnosis not present

## 2020-05-20 DIAGNOSIS — R231 Pallor: Secondary | ICD-10-CM | POA: Diagnosis not present

## 2020-05-20 DIAGNOSIS — E785 Hyperlipidemia, unspecified: Secondary | ICD-10-CM | POA: Diagnosis not present

## 2020-05-20 DIAGNOSIS — R059 Cough, unspecified: Secondary | ICD-10-CM | POA: Diagnosis not present

## 2020-05-20 DIAGNOSIS — R944 Abnormal results of kidney function studies: Secondary | ICD-10-CM | POA: Diagnosis not present

## 2020-05-20 DIAGNOSIS — E782 Mixed hyperlipidemia: Secondary | ICD-10-CM | POA: Diagnosis not present

## 2020-05-21 ENCOUNTER — Encounter (HOSPITAL_COMMUNITY)
Admission: RE | Admit: 2020-05-21 | Discharge: 2020-05-21 | Disposition: A | Discharge status: Home / Self Care | Payer: Medicare Other | Source: Ambulatory Visit | Attending: Internal Medicine | Admitting: Internal Medicine

## 2020-05-26 NOTE — Progress Notes (Signed)
Office Visit Note  Patient: Patricia Jackson             Date of Birth: Jan 25, 1941           MRN: [id number]             PCP: Celene Squibb, MD Referring: Celene Squibb, MD Visit Date: 05/28/2020 Occupation: @GUAROCC @  Subjective:  Positive ANA, shortness of breath.   History of Present Illness: Patricia Jackson is a 80 y.o. female was evaluated for positive ANA.  She also has history of pulmonary fibrosis although her high-resolution CT was negative for interstitial lung disease.  She denies any history of oral ulcers, nasal ulcers, malar rash,  photosensitivity or inflammatory arthritis.  She continues to have some shortness of breath on exertion.  She gives history of sicca symptoms which could be age-related and medication related.  She complains of mild Raynaud's symptoms in her toes.  Activities of Daily Living:  Patient reports morning stiffness for 0 minutes.   Patient Denies nocturnal pain.  Difficulty dressing/grooming: Denies Difficulty climbing stairs: Reports Difficulty getting out of chair: Reports Difficulty using hands for taps, buttons, cutlery, and/or writing: Denies  Review of Systems  Constitutional: Negative for fatigue, night sweats, weight gain and weight loss.  HENT: Negative for mouth sores, trouble swallowing, trouble swallowing, mouth dryness and nose dryness.   Eyes: Positive for dryness. Negative for pain, redness, itching and visual disturbance.  Respiratory: Positive for shortness of breath and difficulty breathing. Negative for cough.   Cardiovascular: Negative for chest pain, palpitations, hypertension, irregular heartbeat and swelling in legs/feet.  Gastrointestinal: Positive for constipation. Negative for blood in stool and diarrhea.  Endocrine: Negative for increased urination.  Genitourinary: Negative for difficulty urinating and vaginal dryness.  Musculoskeletal: Negative for arthralgias, joint pain, joint swelling, myalgias, muscle weakness,  morning stiffness, muscle tenderness and myalgias.  Skin: Positive for color change. Negative for rash, hair loss, redness, skin tightness, ulcers and sensitivity to sunlight.  Allergic/Immunologic: Negative for susceptible to infections.  Neurological: Positive for dizziness and numbness. Negative for headaches, memory loss, night sweats and weakness.  Hematological: Positive for bruising/bleeding tendency. Negative for swollen glands.  Psychiatric/Behavioral: Negative for depressed mood, confusion and sleep disturbance. The patient is not nervous/anxious.     PMFS History:  Patient Active Problem List   Diagnosis Date Noted  . 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 Traction detachment of left retina 08/03/2019  . Left retinal detachment 08/01/2019  . Vitreous hemorrhage of left eye ([place]) 08/01/2019  . 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 Retinal hemorrhage of left eye 08/01/2019  . Advanced nonexudative age-related macular degeneration of left eye with subfoveal involvement 08/01/2019  . Total retinal detachment of left eye 07/26/2019  . 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 Thrombocytopenia ([place]) 07/04/2019  . Orthostatic syncope 08/27/2017  . Atypical chest pain 08/27/2017  . Atrial flutter ([place]) 10/28/2016  . 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 COPD (chronic obstructive pulmonary disease) ([place]) 10/28/2016  . Depression 10/28/2016  . Esophageal dysphagia   . Anemia 04/07/2016  . Sinus bradycardia   . CAD in native artery 03/16/2016  . S/P angioplasty with stent 03/15/16 DES Resolute, [place] 03/16/2016  . NSTEMI (non-ST elevated myocardial infarction) ([place]) 03/12/2016  . Shortness of breath   . 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[place]) 10/31/2015  . Dyslipidemia 10/31/2015  .  Atrial flutter with rapid ventricular response ([place])  10/30/2015  . Back pain 11/26/2013  . Arm pain 11/26/2013  . Chest pain 11/26/2013  . Hypertension     Past Medical History:  Diagnosis Date  . A-fib ([place])   . Atrial flutter ([place])    On Eliquis in 8/17 but discontinued after stent placement  . Breast cancer ([place])    remote  . CAD in native artery 03/16/2016  . COPD (chronic obstructive pulmonary disease) ([place])   . Hypertension   . S/P angioplasty with stent 03/15/16 DES Resolute, pLCX 03/16/2016    Family History  Problem Relation Age of Onset  . Heart disease Mother   . COPD Father   . Cancer Sister        unknown primary  . Cancer Brother        unknown primary  . Cancer Brother   . Hypertension Son   . Colon cancer Neg Hx    Past Surgical History:  Procedure Laterality Date  . APPENDECTOMY    . BREAST SURGERY Left   . CARDIAC CATHETERIZATION N/A 03/15/2016   Procedure: Left Heart Cath and Coronary Angiography;  Surgeon: Belva Crome, MD;  Location: [facility];  Service: Cardiovascular;  Laterality: N/A;  . CARDIAC CATHETERIZATION N/A 03/15/2016   Procedure: Coronary Stent Intervention;  Surgeon: Belva Crome, MD;  Location: [facility];  Service: Cardiovascular;  Laterality: N/A;  . COLONOSCOPY     remote  . COLONOSCOPY N/A 05/26/2016   Procedure: COLONOSCOPY;  Surgeon: Daneil Dolin, MD;  [facility];  Service: Endoscopy;  Laterality: N/A;  845  . ESOPHAGOGASTRODUODENOSCOPY N/A 05/26/2016   Procedure: ESOPHAGOGASTRODUODENOSCOPY (EGD);  Surgeon: Daneil Dolin, MD;  [facility];  Service: Endoscopy;  Laterality: N/A;  Venia Minks DILATION N/A 05/26/2016   Procedure: Venia Minks DILATION;  Surgeon: Daneil Dolin, MD;  [facility];  Service: Endoscopy;  Laterality: N/A;  . VITRECTOMY Left 10/03/2019   Dr. Zadie Rhine, Vitrectomy, Focal Laser, Removal of Silicone Oil   Social History   Social History Narrative   ** Merged History Encounter **       Immunization History   Administered Date(s) Administered  . Influenza,inj,Quad PF,6+ Mos 01/27/2016  . Moderna Sars-Covid-2 Vaccination 06/15/2019, 07/16/2019  . Tdap 09/04/2019     Objective: Vital Signs: BP 108/66 (BP Location: Right Arm, Patient Position: Sitting, Cuff Size: Normal)   Pulse 63   Resp 13   Ht 5' 7"  (1.702 m)   Wt 150 lb 3.2 oz (68.1 kg)   BMI 23.52 kg/m    Physical Exam Vitals and nursing note reviewed.  Constitutional:      Appearance: She is well-developed and well-nourished.  HENT:     Head: Normocephalic and atraumatic.  Eyes:     Extraocular Movements: EOM normal.     Conjunctiva/sclera: Conjunctivae normal.  Cardiovascular:     Rate and Rhythm: Normal rate and regular rhythm.     Pulses: Intact distal pulses.     Heart sounds: Normal heart sounds.  Pulmonary:     Effort: Pulmonary effort is normal.  Abdominal:     General: Bowel sounds are normal.     Palpations: Abdomen is soft.  Musculoskeletal:     Cervical back: Normal range of motion.  Lymphadenopathy:     Cervical: No cervical adenopathy.  Skin:    General: Skin is warm and dry.     Capillary Refill: Capillary refill  takes 2 to 3 seconds.  Neurological:     Mental Status: She is alert and oriented to person, place, and time.  Psychiatric:        Mood and Affect: Mood and affect normal.        Behavior: Behavior normal.      Musculoskeletal Exam: C-spine was in good range of motion.  Shoulder joints, elbow joints, wrist joints, MCPs PIPs and DIPs with good range of motion with no synovitis.  Hip joints, knee joints, ankles, MTPs and PIPs with good range of motion with no synovitis.  CDAI Exam: CDAI Score: -- Patient Global: --; Provider Global: -- Swollen: --; Tender: -- Joint Exam 05/28/2020   No joint exam has been documented for this visit   There is currently no information documented on the homunculus. Go to the Rheumatology activity and complete the homunculus joint exam.  Investigation: No  additional findings.  Imaging: No results found.  Recent Labs: Lab Results  Component Value Date   WBC 4.8 04/03/2020   HGB 12.0 04/03/2020   PLT 117 (L) 04/03/2020   NA 141 07/06/2019   K 4.5 07/06/2019   CL 114 (H) 07/06/2019   CO2 22 07/06/2019   GLUCOSE 104 (H) 07/06/2019   BUN 26 (H) 07/06/2019   CREATININE 1.53 (H) 07/06/2019   BILITOT 0.5 07/04/2019   ALKPHOS 66 07/04/2019   AST 40 07/04/2019   ALT 27 07/04/2019   PROT 6.8 07/04/2019   ALBUMIN 4.1 07/04/2019   CALCIUM 9.1 07/06/2019   GFRAA 38 (L) 07/06/2019   December 31, 2019 ANA 1: 1280 NH, CB CAP BC 4d+, ENA negative, anticardiolipin negative, beta-2 negative, antiphosphatidylserine IgM> 150, antihistone negative, RF negative, anti-CCP negative, antithyroglobulin negative, anti-TPO negative  Speciality Comments: No specialty comments available.  Procedures:  No procedures performed Allergies: Sulfa antibiotics and Sulfa antibiotics   Assessment / Plan:     Visit Diagnoses: Positive ANA (antinuclear antibody) - ANA 1: 1280NH, CB CAP BC 4d+, aPL IgM >150, thrombocytopenia, history of Raynauds and sicca symptoms.  I discussed Results with the patient at length.  She has mild Raynaud's symptoms which could be related to COPD.  Sicca symptoms could be related to medications that age.  She does not have any other clinical features of lupus or autoimmune disease.  I detailed discussion with the patient regarding the lab work.  Have a repeat ANA, CB BC 4d, anticardiolipin antibody in 3 months.  I advised her to contact me if she develops any new symptoms.  I doubt that autoimmune disease is the cause of her pulmonary fibrosis at this point.   Raynaud's syndrome without gangrene-mild symptoms in her toes.  Keeping core temperature warm and warm clothing was discussed.  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[place]) - Followed by Dr. Susann Givens on 22nd 2018 was negative for interstitial lung disease.  Thrombocytopenia (HCC)-she has had  thrombocytopenia in the last few months.  Have referred to hematology for evaluation.  Essential hypertension-her blood pressure is normal today.  Atrial flutter with rapid ventricular response (HCC)-she is on Eliquis.  NSTEMI (non-ST elevated myocardial infarction) ([place])  CAD in native artery  S/P angioplasty with stent 03/15/16 DES Resolute, pLCX  Dyslipidemia  Sinus bradycardia  Exudative age-related macular degeneration of left eye with active choroidal neovascularization (HCC)  Esophageal dysphagia  CKD (chronic kidney disease), stage IV (HCC)  History of COPD  Age-related osteoporosis without current pathological fracture - November 17, 2017 the BMD measured at Femur Neck Right is 0.655 g/cm2  with a T-scoreof -2.8.  She is on Prolia.  Orders: No orders of the defined types were placed in this encounter.  No orders of the defined types were placed in this encounter.    Follow-Up Instructions: Return in about 3 months (around 08/25/2020) for +ANA, +APS IgM.   Bo Merino, MD  Note - This record has been created using Editor, commissioning.  Chart creation errors have been sought, but may not always  have been located. Such creation errors do not reflect on  the standard of medical care.

## 2020-05-28 ENCOUNTER — Other Ambulatory Visit: Payer: Self-pay

## 2020-05-28 ENCOUNTER — Encounter: Payer: Self-pay | Admitting: Rheumatology

## 2020-05-28 ENCOUNTER — Ambulatory Visit (INDEPENDENT_AMBULATORY_CARE_PROVIDER_SITE_OTHER): Payer: Medicare Other | Admitting: Rheumatology

## 2020-05-28 VITALS — BP 108/66 | HR 63 | Resp 13 | Ht 67.0 in | Wt 150.2 lb

## 2020-05-28 DIAGNOSIS — N184 Chronic kidney disease, stage 4 (severe): Secondary | ICD-10-CM

## 2020-05-28 DIAGNOSIS — M810 Age-related osteoporosis without current pathological fracture: Secondary | ICD-10-CM

## 2020-05-28 DIAGNOSIS — Z8709 Personal history of other diseases of the respiratory system: Secondary | ICD-10-CM

## 2020-05-28 DIAGNOSIS — R001 Bradycardia, unspecified: Secondary | ICD-10-CM | POA: Diagnosis not present

## 2020-05-28 DIAGNOSIS — I2510 Atherosclerotic heart disease of native coronary artery without angina pectoris: Secondary | ICD-10-CM

## 2020-05-28 DIAGNOSIS — I7300 Raynaud's syndrome without gangrene: Secondary | ICD-10-CM | POA: Diagnosis not present

## 2020-05-28 DIAGNOSIS — I214 Non-ST elevation (NSTEMI) myocardial infarction: Secondary | ICD-10-CM

## 2020-05-28 DIAGNOSIS — I10 Essential (primary) hypertension: Secondary | ICD-10-CM

## 2020-05-28 DIAGNOSIS — R768 Other specified abnormal immunological findings in serum: Secondary | ICD-10-CM

## 2020-05-28 DIAGNOSIS — H353221 Exudative age-related macular degeneration, left eye, with active choroidal neovascularization: Secondary | ICD-10-CM | POA: Diagnosis not present

## 2020-05-28 DIAGNOSIS — Z95820 Peripheral vascular angioplasty status with implants and grafts: Secondary | ICD-10-CM

## 2020-05-28 DIAGNOSIS — J8410 Pulmonary fibrosis, unspecified: Secondary | ICD-10-CM

## 2020-05-28 DIAGNOSIS — I4892 Unspecified atrial flutter: Secondary | ICD-10-CM | POA: Diagnosis not present

## 2020-05-28 DIAGNOSIS — D696 Thrombocytopenia, unspecified: Secondary | ICD-10-CM | POA: Diagnosis not present

## 2020-05-28 DIAGNOSIS — E785 Hyperlipidemia, unspecified: Secondary | ICD-10-CM

## 2020-05-28 DIAGNOSIS — R1319 Other dysphagia: Secondary | ICD-10-CM

## 2020-06-12 ENCOUNTER — Encounter (HOSPITAL_COMMUNITY): Admission: RE | Admit: 2020-06-12 | Payer: Medicare Other | Source: Ambulatory Visit

## 2020-06-18 ENCOUNTER — Encounter (HOSPITAL_COMMUNITY): Payer: Self-pay

## 2020-06-18 ENCOUNTER — Encounter (HOSPITAL_COMMUNITY)
Admission: RE | Admit: 2020-06-18 | Discharge: 2020-06-18 | Disposition: A | Discharge status: Home / Self Care | Payer: Medicare Other | Source: Ambulatory Visit | Attending: Internal Medicine | Admitting: Internal Medicine

## 2020-06-18 ENCOUNTER — Other Ambulatory Visit: Payer: Self-pay

## 2020-06-18 DIAGNOSIS — M810 Age-related osteoporosis without current pathological fracture: Secondary | ICD-10-CM | POA: Insufficient documentation

## 2020-06-18 MED ORDER — DENOSUMAB 60 MG/ML ~~LOC~~ SOSY: 60.0000 mg | PREFILLED_SYRINGE | Freq: Once | SUBCUTANEOUS | Status: AC

## 2020-06-18 MED ORDER — DENOSUMAB 60 MG/ML ~~LOC~~ SOSY
PREFILLED_SYRINGE | SUBCUTANEOUS | Status: AC
  Administered 2020-06-18: 60 mg via SUBCUTANEOUS
  Filled 2020-06-18: qty 1

## 2020-06-27 DIAGNOSIS — I959 Hypotension, unspecified: Secondary | ICD-10-CM | POA: Diagnosis not present

## 2020-06-27 DIAGNOSIS — D509 Iron deficiency anemia, unspecified: Secondary | ICD-10-CM | POA: Diagnosis not present

## 2020-06-27 DIAGNOSIS — E785 Hyperlipidemia, unspecified: Secondary | ICD-10-CM | POA: Diagnosis not present

## 2020-06-27 DIAGNOSIS — R7301 Impaired fasting glucose: Secondary | ICD-10-CM | POA: Diagnosis not present

## 2020-06-27 DIAGNOSIS — E782 Mixed hyperlipidemia: Secondary | ICD-10-CM | POA: Diagnosis not present

## 2020-06-27 DIAGNOSIS — I10 Essential (primary) hypertension: Secondary | ICD-10-CM | POA: Diagnosis not present

## 2020-07-01 DIAGNOSIS — E870 Hyperosmolality and hypernatremia: Secondary | ICD-10-CM | POA: Diagnosis not present

## 2020-07-01 DIAGNOSIS — E039 Hypothyroidism, unspecified: Secondary | ICD-10-CM | POA: Diagnosis not present

## 2020-07-01 DIAGNOSIS — R7301 Impaired fasting glucose: Secondary | ICD-10-CM | POA: Diagnosis not present

## 2020-07-01 DIAGNOSIS — J449 Chronic obstructive pulmonary disease, unspecified: Secondary | ICD-10-CM | POA: Diagnosis not present

## 2020-07-01 DIAGNOSIS — N1832 Chronic kidney disease, stage 3b: Secondary | ICD-10-CM | POA: Diagnosis not present

## 2020-07-01 DIAGNOSIS — J441 Chronic obstructive pulmonary disease with (acute) exacerbation: Secondary | ICD-10-CM | POA: Diagnosis not present

## 2020-07-01 DIAGNOSIS — I482 Chronic atrial fibrillation, unspecified: Secondary | ICD-10-CM | POA: Diagnosis not present

## 2020-07-01 DIAGNOSIS — E875 Hyperkalemia: Secondary | ICD-10-CM | POA: Diagnosis not present

## 2020-07-01 DIAGNOSIS — D696 Thrombocytopenia, unspecified: Secondary | ICD-10-CM | POA: Diagnosis not present

## 2020-07-01 DIAGNOSIS — R7303 Prediabetes: Secondary | ICD-10-CM | POA: Diagnosis not present

## 2020-07-01 DIAGNOSIS — E782 Mixed hyperlipidemia: Secondary | ICD-10-CM | POA: Diagnosis not present

## 2020-07-09 ENCOUNTER — Other Ambulatory Visit: Payer: Self-pay | Admitting: Cardiology

## 2020-07-16 ENCOUNTER — Other Ambulatory Visit: Payer: Self-pay | Admitting: Cardiology

## 2020-07-23 DIAGNOSIS — D509 Iron deficiency anemia, unspecified: Secondary | ICD-10-CM | POA: Diagnosis not present

## 2020-07-23 DIAGNOSIS — E782 Mixed hyperlipidemia: Secondary | ICD-10-CM | POA: Diagnosis not present

## 2020-07-23 DIAGNOSIS — I959 Hypotension, unspecified: Secondary | ICD-10-CM | POA: Diagnosis not present

## 2020-07-23 DIAGNOSIS — E785 Hyperlipidemia, unspecified: Secondary | ICD-10-CM | POA: Diagnosis not present

## 2020-07-23 DIAGNOSIS — R944 Abnormal results of kidney function studies: Secondary | ICD-10-CM | POA: Diagnosis not present

## 2020-07-29 DIAGNOSIS — J441 Chronic obstructive pulmonary disease with (acute) exacerbation: Secondary | ICD-10-CM | POA: Diagnosis not present

## 2020-08-03 DIAGNOSIS — K219 Gastro-esophageal reflux disease without esophagitis: Secondary | ICD-10-CM | POA: Diagnosis not present

## 2020-08-28 NOTE — Progress Notes (Unsigned)
Office Visit Note  Patient: Patricia Jackson             Date of Birth: Oct 20, 1940           MRN: [id number]             PCP: Celene Squibb, MD Referring: Celene Squibb, MD Visit Date: 09/11/2020 Occupation: @GUAROCC @  Subjective:  No chief complaint on file.   History of Present Illness: Patricia Jackson is a 80 y.o. female ***   Activities of Daily Living:  Patient reports morning stiffness for *** {minute/hour:19697}.   Patient {ACTIONS;DENIES/REPORTS:21021675::"Denies"} nocturnal pain.  Difficulty dressing/grooming: {ACTIONS;DENIES/REPORTS:21021675::"Denies"} Difficulty climbing stairs: {ACTIONS;DENIES/REPORTS:21021675::"Denies"} Difficulty getting out of chair: {ACTIONS;DENIES/REPORTS:21021675::"Denies"} Difficulty using hands for taps, buttons, cutlery, and/or writing: {ACTIONS;DENIES/REPORTS:21021675::"Denies"}  No Rheumatology ROS completed.   PMFS History:  Patient Active Problem List   Diagnosis Date Noted  . 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 Traction detachment of left retina 08/03/2019  . Left retinal detachment 08/01/2019  . Vitreous hemorrhage of left eye ([place]) 08/01/2019  . Exudative age-related macular degeneration of left eye with active choroidal neovascularization ([place]) 08/01/2019  . Retinal hemorrhage of left eye 08/01/2019  . Advanced nonexudative age-related macular degeneration of left eye with subfoveal involvement 08/01/2019  . Total retinal detachment of left eye 07/26/2019  . 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 Thrombocytopenia ([place]) 07/04/2019  . Orthostatic syncope 08/27/2017  . Atypical chest pain 08/27/2017  . Atrial flutter ([place]) 10/28/2016  . CKD (chronic kidney disease), stage IV ([place]) 10/28/2016  . COPD (chronic obstructive pulmonary disease) ([place]) 10/28/2016  . Depression 10/28/2016  . Esophageal dysphagia   . Anemia 04/07/2016  . Sinus bradycardia   . CAD in  native artery 03/16/2016  . S/P angioplasty with stent 03/15/16 DES Resolute, [place] 03/16/2016  . NSTEMI (non-ST elevated myocardial infarction) ([place]) 03/12/2016  . Shortness of breath   . 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[place]) 10/31/2015  . Dyslipidemia 10/31/2015  . Atrial flutter with rapid ventricular response ([place]) 10/30/2015  . Back pain 11/26/2013  . Arm pain 11/26/2013  . Chest pain 11/26/2013  . Hypertension     Past Medical History:  Diagnosis Date  . A-fib ([place])   . Atrial flutter ([place])    On Eliquis in 8/17 but discontinued after stent placement  . Breast cancer ([place])    remote  . CAD in native artery 03/16/2016  . COPD (chronic obstructive pulmonary disease) ([place])   . Hypertension   . S/P angioplasty with stent 03/15/16 DES Resolute, pLCX 03/16/2016    Family History  Problem Relation Age of Onset  . Heart disease Mother   . COPD Father   . Cancer Sister        unknown primary  . Cancer Brother        unknown primary  . Cancer Brother   . Hypertension Son   . Colon cancer Neg Hx    Past Surgical History:  Procedure Laterality Date  . APPENDECTOMY    . BREAST SURGERY Left   . CARDIAC CATHETERIZATION N/A 03/15/2016   Procedure: Left Heart Cath and Coronary Angiography;  Surgeon: Belva Crome, MD;  Location: [facility];  Service: Cardiovascular;  Laterality: N/A;  . CARDIAC CATHETERIZATION N/A 03/15/2016   Procedure: Coronary Stent Intervention;  Surgeon: Belva Crome, MD;  Location: [facility];  Service: Cardiovascular;  Laterality: N/A;  . COLONOSCOPY     remote  .  COLONOSCOPY N/A 05/26/2016   Procedure: COLONOSCOPY;  Surgeon: Daneil Dolin, MD;  [facility];  Service: Endoscopy;  Laterality: N/A;  845  . ESOPHAGOGASTRODUODENOSCOPY N/A 05/26/2016   Procedure: ESOPHAGOGASTRODUODENOSCOPY (EGD);  Surgeon: Daneil Dolin, MD;  [facility];  Service: Endoscopy;  Laterality: N/A;  Venia Minks DILATION N/A 05/26/2016   Procedure: Venia Minks  DILATION;  Surgeon: Daneil Dolin, MD;  [facility];  Service: Endoscopy;  Laterality: N/A;  . VITRECTOMY Left 10/03/2019   Dr. Zadie Rhine, Vitrectomy, Focal Laser, Removal of Silicone Oil   Social History   Social History Narrative   ** Merged History Encounter **       Immunization History  Administered Date(s) Administered  . Influenza,inj,Quad PF,6+ Mos 01/27/2016  . Moderna Sars-Covid-2 Vaccination 06/15/2019, 07/16/2019  . Tdap 09/04/2019     Objective: Vital Signs: There were no vitals taken for this visit.   Physical Exam   Musculoskeletal Exam: ***  CDAI Exam: CDAI Score: -- Patient Global: --; Provider Global: -- Swollen: --; Tender: -- Joint Exam 09/11/2020   No joint exam has been documented for this visit   There is currently no information documented on the homunculus. Go to the Rheumatology activity and complete the homunculus joint exam.  Investigation: No additional findings.  Imaging: No results found.  Recent Labs: Lab Results  Component Value Date   WBC 4.8 04/03/2020   HGB 12.0 04/03/2020   PLT 117 (L) 04/03/2020   NA 141 07/06/2019   K 4.5 07/06/2019   CL 114 (H) 07/06/2019   CO2 22 07/06/2019   GLUCOSE 104 (H) 07/06/2019   BUN 26 (H) 07/06/2019   CREATININE 1.53 (H) 07/06/2019   BILITOT 0.5 07/04/2019   ALKPHOS 66 07/04/2019   AST 40 07/04/2019   ALT 27 07/04/2019   PROT 6.8 07/04/2019   ALBUMIN 4.1 07/04/2019   CALCIUM 9.1 07/06/2019   GFRAA 38 (L) 07/06/2019    Speciality Comments: No specialty comments available.  Procedures:  No procedures performed Allergies: Sulfa antibiotics and Sulfa antibiotics   Assessment / Plan:     Visit Diagnoses: Positive ANA (antinuclear antibody)  Raynaud's syndrome without gangrene  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HCC)  Thrombocytopenia (HCC)  Essential hypertension  Atrial flutter with rapid ventricular response (HCC)  NSTEMI (non-ST elevated myocardial infarction) ([place])  CAD in  native artery  S/P angioplasty with stent 03/15/16 DES Resolute, pLCX  Dyslipidemia  Sinus bradycardia  Exudative age-related macular degeneration of left eye with active choroidal neovascularization (HCC)  Esophageal dysphagia  CKD (chronic kidney disease), stage IV (HCC)  History of COPD  Age-related osteoporosis without current pathological fracture  Orders: No orders of the defined types were placed in this encounter.  No orders of the defined types were placed in this encounter.   Face-to-face time spent with patient was *** minutes. Greater than 50% of time was spent in counseling and coordination of care.  Follow-Up Instructions: No follow-ups on file.   Ofilia Neas, PA-C  Note - This record has been created using Dragon software.  Chart creation errors have been sought, but may not always  have been located. Such creation errors do not reflect on  the standard of medical care.

## 2020-09-11 ENCOUNTER — Ambulatory Visit: Payer: Medicare Other | Admitting: Rheumatology

## 2020-09-11 DIAGNOSIS — I7300 Raynaud's syndrome without gangrene: Secondary | ICD-10-CM

## 2020-09-11 DIAGNOSIS — I214 Non-ST elevation (NSTEMI) myocardial infarction: Secondary | ICD-10-CM

## 2020-09-11 DIAGNOSIS — Z95820 Peripheral vascular angioplasty status with implants and grafts: Secondary | ICD-10-CM

## 2020-09-11 DIAGNOSIS — M810 Age-related osteoporosis without current pathological fracture: Secondary | ICD-10-CM

## 2020-09-11 DIAGNOSIS — I2510 Atherosclerotic heart disease of native coronary artery without angina pectoris: Secondary | ICD-10-CM

## 2020-09-11 DIAGNOSIS — R001 Bradycardia, unspecified: Secondary | ICD-10-CM

## 2020-09-11 DIAGNOSIS — N184 Chronic kidney disease, stage 4 (severe): Secondary | ICD-10-CM

## 2020-09-11 DIAGNOSIS — E785 Hyperlipidemia, unspecified: Secondary | ICD-10-CM

## 2020-09-11 DIAGNOSIS — H353221 Exudative age-related macular degeneration, left eye, with active choroidal neovascularization: Secondary | ICD-10-CM

## 2020-09-11 DIAGNOSIS — J8410 Pulmonary fibrosis, unspecified: Secondary | ICD-10-CM

## 2020-09-11 DIAGNOSIS — Z8709 Personal history of other diseases of the respiratory system: Secondary | ICD-10-CM

## 2020-09-11 DIAGNOSIS — R1319 Other dysphagia: Secondary | ICD-10-CM

## 2020-09-11 DIAGNOSIS — I10 Essential (primary) hypertension: Secondary | ICD-10-CM

## 2020-09-11 DIAGNOSIS — R768 Other specified abnormal immunological findings in serum: Secondary | ICD-10-CM

## 2020-09-11 DIAGNOSIS — I4892 Unspecified atrial flutter: Secondary | ICD-10-CM

## 2020-09-11 DIAGNOSIS — D696 Thrombocytopenia, unspecified: Secondary | ICD-10-CM

## 2020-09-18 NOTE — Progress Notes (Signed)
Office Visit Note  Patient: Patricia Jackson             Date of Birth: 17-Oct-1940           MRN: [id number]             PCP: Celene Squibb, MD Referring: Celene Squibb, MD Visit Date: 10/02/2020 Occupation: @GUAROCC @  Subjective:  Neck stiffness.   History of Present Illness: Patricia Jackson is a 80 y.o. female with a history of positive ANA and osteoarthritis returns for follow-up visit today.  She states she woke up yesterday and has been experiencing some tightness in her neck which she describes over the trapezius region.  She also describes discomfort over the left trochanteric area and difficulty sleeping on the side at times.  She continues to have some stiffness in her hands but no joint swelling.  She denies any history of oral ulcers, nasal ulcers.  She has dry mouth.  There is no history of lymphadenopathy, malar rash, photosensitivity.  She denies any history of inflammatory arthritis.  She states her hands turn cold and white when she is exposed to colder temperatures.  Activities of Daily Living:  Patient reports morning stiffness for 1 hour.   Patient Denies nocturnal pain.  Difficulty dressing/grooming: Denies Difficulty climbing stairs: Reports Difficulty getting out of chair: Reports Difficulty using hands for taps, buttons, cutlery, and/or writing: Denies  Review of Systems  Constitutional:  Positive for fatigue.  HENT:  Positive for mouth dryness. Negative for mouth sores and nose dryness.   Eyes:  Positive for itching. Negative for pain and dryness.  Respiratory:  Positive for shortness of breath and difficulty breathing.   Cardiovascular:  Negative for chest pain and palpitations.  Gastrointestinal:  Positive for constipation. Negative for blood in stool and diarrhea.  Endocrine: Negative for increased urination.  Genitourinary:  Negative for difficulty urinating.  Musculoskeletal:  Positive for joint pain, joint pain and morning stiffness. Negative for joint  swelling, myalgias, muscle tenderness and myalgias.  Skin:  Positive for color change. Negative for rash, redness and sensitivity to sunlight.  Allergic/Immunologic: Negative for susceptible to infections.  Neurological:  Positive for dizziness and numbness. Negative for headaches and memory loss.  Hematological:  Positive for bruising/bleeding tendency. Negative for swollen glands.  Psychiatric/Behavioral:  Negative for confusion.    PMFS History:  Patient Active Problem List   Diagnosis Date Noted   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   Traction detachment of left retina 08/03/2019   Left retinal detachment 08/01/2019   Vitreous hemorrhage of left eye ([place]) 08/01/2019   Exudative age-related macular degeneration of left eye with active choroidal neovascularization ([place]) 08/01/2019   Retinal hemorrhage of left eye 08/01/2019   Advanced nonexudative age-related macular degeneration of left eye with subfoveal involvement 08/01/2019   Total retinal detachment of left eye 07/26/2019   Choroidal detachment of right eye 07/26/2019   Exudative retinopathy of left eye 07/26/2019   Thrombocytopenia ([place]) 07/04/2019   Orthostatic syncope 08/27/2017   Atypical chest pain 08/27/2017   Atrial flutter ([place]) 10/28/2016   CKD (chronic kidney disease), stage IV ([place]) 10/28/2016   COPD (chronic obstructive pulmonary disease) ([place]) 10/28/2016   Depression 10/28/2016   Esophageal dysphagia    Anemia 04/07/2016   Sinus bradycardia    CAD in native artery 03/16/2016   S/P angioplasty with stent 03/15/16 DES Resolute, [place] 03/16/2016   NSTEMI (non-ST elevated myocardial infarction) ([place]) 03/12/2016   Shortness of breath  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[place]) 10/31/2015   Dyslipidemia 10/31/2015   Atrial flutter with rapid ventricular response ([place]) 10/30/2015   Back pain 11/26/2013   Arm pain 11/26/2013   Chest pain 11/26/2013   Hypertension     Past Medical History:  Diagnosis Date   A-fib  [facility])    Atrial flutter ([place])    On Eliquis in 8/17 but discontinued after stent placement   Breast cancer ([place])    remote   CAD in native artery 03/16/2016   COPD (chronic obstructive pulmonary disease) ([place])    Hypertension    S/P angioplasty with stent 03/15/16 DES Resolute, pLCX 03/16/2016    Family History  Problem Relation Age of Onset   Heart disease Mother    COPD Father    Cancer Sister        unknown primary   Cancer Brother        unknown primary   Cancer Brother    Hypertension Son    Colon cancer Neg Hx    Past Surgical History:  Procedure Laterality Date   APPENDECTOMY     BREAST SURGERY Left    CARDIAC CATHETERIZATION N/A 03/15/2016   Procedure: Left Heart Cath and Coronary Angiography;  Surgeon: Belva Crome, MD;  Location: [facility];  Service: Cardiovascular;  Laterality: N/A;   CARDIAC CATHETERIZATION N/A 03/15/2016   Procedure: Coronary Stent Intervention;  Surgeon: Belva Crome, MD;  Location: [facility];  Service: Cardiovascular;  Laterality: N/A;   COLONOSCOPY     remote   COLONOSCOPY N/A 05/26/2016   Procedure: COLONOSCOPY;  Surgeon: Daneil Dolin, MD;  [facility];  Service: Endoscopy;  Laterality: N/A;  845   ESOPHAGOGASTRODUODENOSCOPY N/A 05/26/2016   Procedure: ESOPHAGOGASTRODUODENOSCOPY (EGD);  Surgeon: Daneil Dolin, MD;  [facility];  Service: Endoscopy;  Laterality: N/A;   MALONEY DILATION N/A 05/26/2016   Procedure: Venia Minks DILATION;  Surgeon: Daneil Dolin, MD;  [facility];  Service: Endoscopy;  Laterality: N/A;   VITRECTOMY Left 10/03/2019   Dr. Zadie Rhine, Vitrectomy, Focal Laser, Removal of Silicone Oil   Social History   Social History Narrative   ** Merged History Encounter **       Immunization History  Administered Date(s) Administered   Influenza,inj,Quad PF,6+ Mos 01/27/2016   Moderna Sars-Covid-2 Vaccination 06/15/2019, 07/16/2019   Tdap 09/04/2019     Objective: Vital  Signs: BP 108/68 (BP Location: Right Arm, Patient Position: Sitting, Cuff Size: Normal)   Pulse (!) 53   Resp 15   Ht 5' 8"  (1.727 m)   Wt 149 lb 9.6 oz (67.9 kg)   BMI 22.75 kg/m    Physical Exam Vitals and nursing note reviewed.  Constitutional:      Appearance: She is well-developed.  HENT:     Head: Normocephalic and atraumatic.  Eyes:     Conjunctiva/sclera: Conjunctivae normal.  Cardiovascular:     Rate and Rhythm: Normal rate and regular rhythm.     Heart sounds: Normal heart sounds.  Pulmonary:     Effort: Pulmonary effort is normal.     Breath sounds: Normal breath sounds.  Abdominal:     General: Bowel sounds are normal.     Palpations: Abdomen is soft.  Musculoskeletal:     Cervical back: Normal range of motion.  Lymphadenopathy:     Cervical: No cervical adenopathy.  Skin:    General: Skin is warm and dry.     Capillary Refill:  Capillary refill takes less than 2 seconds.  Neurological:     Mental Status: She is alert and oriented to person, place, and time.  Psychiatric:        Behavior: Behavior normal.     Musculoskeletal Exam: C-spine was in good range of motion.  She had right trapezius spasm.  Shoulder joints, elbow joints, wrist joints with good range of motion.  She had bilateral PIP and DIP thickening with no synovitis.  Hip joints were in good range of motion.  She had tenderness over left trochanteric area.  There was no tenderness on palpation of knee joints, ankles, MTPs or PIPs.  CDAI Exam: CDAI Score: -- Patient Global: --; Provider Global: -- Swollen: --; Tender: -- Joint Exam 10/02/2020   No joint exam has been documented for this visit   There is currently no information documented on the homunculus. Go to the Rheumatology activity and complete the homunculus joint exam.  Investigation: No additional findings.  Imaging: No results found.  Recent Labs: Lab Results  Component Value Date   WBC 4.8 04/03/2020   HGB 12.0 04/03/2020    PLT 117 (L) 04/03/2020   NA 141 07/06/2019   K 4.5 07/06/2019   CL 114 (H) 07/06/2019   CO2 22 07/06/2019   GLUCOSE 104 (H) 07/06/2019   BUN 26 (H) 07/06/2019   CREATININE 1.53 (H) 07/06/2019   BILITOT 0.5 07/04/2019   ALKPHOS 66 07/04/2019   AST 40 07/04/2019   ALT 27 07/04/2019   PROT 6.8 07/04/2019   ALBUMIN 4.1 07/04/2019   CALCIUM 9.1 07/06/2019   GFRAA 38 (L) 07/06/2019    Speciality Comments: No specialty comments available.  Procedures:  No procedures performed Allergies: Sulfa antibiotics and Sulfa antibiotics   Assessment / Plan:     Visit Diagnoses: Positive ANA (antinuclear antibody) - ANA 1: 1280NH, CB CAP BC 4d+, aPS IgM >150, thrombocytopenia, history of Raynauds and sicca symptoms.  Planes of Raynaud's and sicca symptoms.  She has no other clinical features of autoimmune disease.  I will recheck her labs today.  Which will include ANA, CB CAP and antiphosphatidylserine antibodies.  At this point I do not see any need for immunosuppression.  Raynaud's syndrome without gangrene-she has mild symptoms during the winter months.  Trapezius muscle spasm-she has been experiencing right trapezius spasm for the last couple of days.  I gave her neck exercises handout.  Primary osteoarthritis of both hands-she has DIP and PIP thickening but not much discomfort.  No synovitis was noted.  Trochanteric bursitis, left hip-she has been having some discomfort in her left trochanteric region.  IT band stretches were discussed and a handout was given.  History of COPD - Followed by Dr. Susann Givens on 22nd 2018 was negative for interstitial lung disease.  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  Thrombocytopenia ([place]) - Have referred to hematology for evaluation.  Essential hypertension  Atrial flutter with rapid ventricular response (HCC) -  on Eliquis  CAD in native artery  NSTEMI (non-ST elevated myocardial infarction) ([place])  S/P angioplasty with stent 03/15/16 DES Resolute, pLCX  Sinus bradycardia  Dyslipidemia  Esophageal dysphagia  Exudative age-related macular degeneration of left eye with active choroidal neovascularization (HCC)  CKD (chronic kidney disease), stage IV ([place])  Orders: No orders of the defined types were placed in this encounter.  No orders of the defined  types were placed in this encounter.   Follow-Up Instructions: Return in about 1 year (around 10/02/2021) for +ANA.   Bo Merino, MD  Note - This record has been created using Editor, commissioning.  Chart creation errors have been sought, but may not always  have been located. Such creation errors do not reflect on  the standard of medical care.

## 2020-10-02 ENCOUNTER — Encounter: Payer: Self-pay | Admitting: Rheumatology

## 2020-10-02 ENCOUNTER — Ambulatory Visit (INDEPENDENT_AMBULATORY_CARE_PROVIDER_SITE_OTHER): Payer: Medicare Other | Admitting: Rheumatology

## 2020-10-02 ENCOUNTER — Other Ambulatory Visit: Payer: Self-pay

## 2020-10-02 VITALS — BP 108/68 | HR 53 | Resp 15 | Ht 68.0 in | Wt 149.6 lb

## 2020-10-02 DIAGNOSIS — M810 Age-related osteoporosis without current pathological fracture: Secondary | ICD-10-CM | POA: Diagnosis not present

## 2020-10-02 DIAGNOSIS — I4892 Unspecified atrial flutter: Secondary | ICD-10-CM | POA: Diagnosis not present

## 2020-10-02 DIAGNOSIS — M25571 Pain in right ankle and joints of right foot: Secondary | ICD-10-CM

## 2020-10-02 DIAGNOSIS — Z95820 Peripheral vascular angioplasty status with implants and grafts: Secondary | ICD-10-CM

## 2020-10-02 DIAGNOSIS — I10 Essential (primary) hypertension: Secondary | ICD-10-CM

## 2020-10-02 DIAGNOSIS — I214 Non-ST elevation (NSTEMI) myocardial infarction: Secondary | ICD-10-CM

## 2020-10-02 DIAGNOSIS — R1319 Other dysphagia: Secondary | ICD-10-CM

## 2020-10-02 DIAGNOSIS — I7300 Raynaud's syndrome without gangrene: Secondary | ICD-10-CM | POA: Diagnosis not present

## 2020-10-02 DIAGNOSIS — N184 Chronic kidney disease, stage 4 (severe): Secondary | ICD-10-CM

## 2020-10-02 DIAGNOSIS — H353221 Exudative age-related macular degeneration, left eye, with active choroidal neovascularization: Secondary | ICD-10-CM

## 2020-10-02 DIAGNOSIS — M19042 Primary osteoarthritis, left hand: Secondary | ICD-10-CM

## 2020-10-02 DIAGNOSIS — R768 Other specified abnormal immunological findings in serum: Secondary | ICD-10-CM

## 2020-10-02 DIAGNOSIS — D696 Thrombocytopenia, unspecified: Secondary | ICD-10-CM

## 2020-10-02 DIAGNOSIS — M19041 Primary osteoarthritis, right hand: Secondary | ICD-10-CM | POA: Diagnosis not present

## 2020-10-02 DIAGNOSIS — Z8709 Personal history of other diseases of the respiratory system: Secondary | ICD-10-CM | POA: Diagnosis not present

## 2020-10-02 DIAGNOSIS — R7689 Other specified abnormal immunological findings in serum: Secondary | ICD-10-CM

## 2020-10-02 DIAGNOSIS — I2510 Atherosclerotic heart disease of native coronary artery without angina pectoris: Secondary | ICD-10-CM | POA: Diagnosis not present

## 2020-10-02 DIAGNOSIS — E785 Hyperlipidemia, unspecified: Secondary | ICD-10-CM

## 2020-10-02 DIAGNOSIS — M7062 Trochanteric bursitis, left hip: Secondary | ICD-10-CM | POA: Diagnosis not present

## 2020-10-02 DIAGNOSIS — M62838 Other muscle spasm: Secondary | ICD-10-CM

## 2020-10-02 DIAGNOSIS — R001 Bradycardia, unspecified: Secondary | ICD-10-CM

## 2020-10-02 NOTE — Patient Instructions (Signed)
Iliotibial Band Syndrome Rehab Ask your health care provider which exercises are safe for you. Do exercises exactly as told by your health care provider and adjust them as directed. It is normal to feel mild stretching, pulling, tightness, or discomfort as you do these exercises. Stop right away if you feel sudden pain or your pain gets significantly worse. Do not begin these exercises until told by your health care provider. Stretching and range-of-motion exercises These exercises warm up your muscles and joints and improve the movement andflexibility of your hip and pelvis. Quadriceps stretch, prone  Lie on your abdomen (prone position) on a firm surface, such as a bed or padded floor. Bend your left / right knee and reach back to hold your ankle or pant leg. If you cannot reach your ankle or pant leg, loop a belt around your foot and grab the belt instead. Gently pull your heel toward your buttocks. Your knee should not slide out to the side. You should feel a stretch in the front of your thigh and knee (quadriceps). Hold this position for __________ seconds. Repeat __________ times. Complete this exercise __________ times a day. Iliotibial band stretch An iliotibial band is a strong band of muscle tissue that runs from the outer side of your hip to the outer side of your thigh and knee. Lie on your side with your left / right leg in the top position. Bend both of your knees and grab your left / right ankle. Stretch out your bottom arm to help you balance. Slowly bring your top knee back so your thigh goes behind your trunk. Slowly lower your top leg toward the floor until you feel a gentle stretch on the outside of your left / right hip and thigh. If you do not feel a stretch and your knee will not fall farther, place the heel of your other foot on top of your knee and pull your knee down toward the floor with your foot. Hold this position for __________ seconds. Repeat __________ times.  Complete this exercise __________ times a day. Strengthening exercises These exercises build strength and endurance in your hip and pelvis. Enduranceis the ability to use your muscles for a long time, even after they get tired. Straight leg raises, side-lying This exercise strengthens the muscles that rotate the leg at the hip and move it away from your body (hip abductors). Lie on your side with your left / right leg in the top position. Lie so your head, shoulder, hip, and knee line up. You may bend your bottom knee to help you balance. Roll your hips slightly forward so your hips are stacked directly over each other and your left / right knee is facing forward. Tense the muscles in your outer thigh and lift your top leg 4-6 inches (10-15 cm). Hold this position for __________ seconds. Slowly lower your leg to return to the starting position. Let your muscles relax completely before doing another repetition. Repeat __________ times. Complete this exercise __________ times a day. Leg raises, prone This exercise strengthens the muscles that move the hips backward (hip extensors). Lie on your abdomen (prone position) on your bed or a firm surface. You can put a pillow under your hips if that is more comfortable for your lower back. Bend your left / right knee so your foot is straight up in the air. Squeeze your buttocks muscles and lift your left / right thigh off the bed. Do not let your back arch. Tense your thigh  muscle as hard as you can without increasing any knee pain. Hold this position for __________ seconds. Slowly lower your leg to return to the starting position and allow it to relax completely. Repeat __________ times. Complete this exercise __________ times a day. Hip hike Stand sideways on a bottom step. Stand on your left / right leg with your other foot unsupported next to the step. You can hold on to a railing or wall for balance if needed. Keep your knees straight and your  torso square. Then lift your left / right hip up toward the ceiling. Slowly let your left / right hip lower toward the floor, past the starting position. Your foot should get closer to the floor. Do not lean or bend your knees. Repeat __________ times. Complete this exercise __________ times a day. This information is not intended to replace advice given to you by your health care provider. Make sure you discuss any questions you have with your healthcare provider. Document Revised: 05/30/2019 Document Reviewed: 05/30/2019 Elsevier Patient Education  2022 [place]. Cervical Strain and Sprain Rehab Ask your health care provider which exercises are safe for you. Do exercises exactly as told by your health care provider and adjust them as directed. It is normal to feel mild stretching, pulling, tightness, or discomfort as you do these exercises. Stop right away if you feel sudden pain or your pain gets worse. Do not begin these exercises until told by your health care provider. Stretching and range-of-motion exercises Cervical side bending  Using good posture, sit on a stable chair or stand up. Without moving your shoulders, slowly tilt your left / right ear to your shoulder until you feel a stretch in the opposite side neck muscles. You should be looking straight ahead. Hold for __________ seconds. Repeat with the other side of your neck. Repeat __________ times. Complete this exercise __________ times a day. Cervical rotation  Using good posture, sit on a stable chair or stand up. Slowly turn your head to the side as if you are looking over your left / right shoulder. Keep your eyes level with the ground. Stop when you feel a stretch along the side and the back of your neck. Hold for __________ seconds. Repeat this by turning to your other side. Repeat __________ times. Complete this exercise __________ times a day. Thoracic extension and pectoral stretch Roll a towel or a small blanket  so it is about 4 inches (10 cm) in diameter. Lie down on your back on a firm surface. Put the towel lengthwise, under your spine in the middle of your back. It should not be under your shoulder blades. The towel should line up with your spine from your middle back to your lower back. Put your hands behind your head and let your elbows fall out to your sides. Hold for __________ seconds. Repeat __________ times. Complete this exercise __________ times a day. Strengthening exercises Isometric upper cervical flexion Lie on your back with a thin pillow behind your head and a small rolled-up towel under your neck. Gently tuck your chin toward your chest and nod your head down to look toward your feet. Do not lift your head off the pillow. Hold for __________ seconds. Release the tension slowly. Relax your neck muscles completely before you repeat this exercise. Repeat __________ times. Complete this exercise __________ times a day. Isometric cervical extension  Stand about 6 inches (15 cm) away from a wall, with your back facing the wall. Place a soft  object, about 6-8 inches (15-20 cm) in diameter, between the back of your head and the wall. A soft object could be a small pillow, a ball, or a folded towel. Gently tilt your head back and press into the soft object. Keep your jaw and forehead relaxed. Hold for __________ seconds. Release the tension slowly. Relax your neck muscles completely before you repeat this exercise. Repeat __________ times. Complete this exercise __________ times a day. Posture and body mechanics Body mechanics refers to the movements and positions of your body while you do your daily activities. Posture is part of body mechanics. Good posture and healthy body mechanics can help to relieve stress in your body's tissues and joints. Good posture means that your spine is in its natural S-curve position (your spine is neutral), your shoulders are pulled back slightly, and your  head is not tipped forward. The following are general guidelines for applying improved posture andbody mechanics to your everyday activities. Sitting  When sitting, keep your spine neutral and keep your feet flat on the floor. Use a footrest, if necessary, and keep your thighs parallel to the floor. Avoid rounding your shoulders, and avoid tilting your head forward. When working at a desk or a computer, keep your desk at a height where your hands are slightly lower than your elbows. Slide your chair under your desk so you are close enough to maintain good posture. When working at a computer, place your monitor at a height where you are looking straight ahead and you do not have to tilt your head forward or downward to look at the screen.  Standing  When standing, keep your spine neutral and keep your feet about hip-width apart. Keep a slight bend in your knees. Your ears, shoulders, and hips should line up. When you do a task in which you stand in one place for a long time, place one foot up on a stable object that is 2-4 inches (5-10 cm) high, such as a footstool. This helps keep your spine neutral.  Resting When lying down and resting, avoid positions that are most painful for you. Try to support your neck in a neutral position. You can use a contour pillow or asmall rolled-up towel. Your pillow should support your neck but not push on it. This information is not intended to replace advice given to you by your health care provider. Make sure you discuss any questions you have with your healthcare provider. Document Revised: 07/12/2018 Document Reviewed: 12/21/2017 Elsevier Patient Education  [place].

## 2020-10-15 ENCOUNTER — Inpatient Hospital Stay (HOSPITAL_COMMUNITY): Payer: Medicare Other

## 2020-10-17 ENCOUNTER — Other Ambulatory Visit (HOSPITAL_COMMUNITY): Payer: Medicare Other

## 2020-10-20 ENCOUNTER — Other Ambulatory Visit: Payer: Self-pay

## 2020-10-20 ENCOUNTER — Inpatient Hospital Stay (HOSPITAL_COMMUNITY): Payer: Medicare Other | Attending: Hematology

## 2020-10-20 DIAGNOSIS — D696 Thrombocytopenia, unspecified: Secondary | ICD-10-CM

## 2020-10-20 DIAGNOSIS — R0602 Shortness of breath: Secondary | ICD-10-CM | POA: Diagnosis not present

## 2020-10-20 DIAGNOSIS — Z8249 Family history of ischemic heart disease and other diseases of the circulatory system: Secondary | ICD-10-CM | POA: Diagnosis not present

## 2020-10-20 DIAGNOSIS — R200 Anesthesia of skin: Secondary | ICD-10-CM | POA: Insufficient documentation

## 2020-10-20 DIAGNOSIS — Z836 Family history of other diseases of the respiratory system: Secondary | ICD-10-CM | POA: Diagnosis not present

## 2020-10-20 DIAGNOSIS — R42 Dizziness and giddiness: Secondary | ICD-10-CM | POA: Diagnosis not present

## 2020-10-20 DIAGNOSIS — K5900 Constipation, unspecified: Secondary | ICD-10-CM | POA: Diagnosis not present

## 2020-10-20 DIAGNOSIS — J449 Chronic obstructive pulmonary disease, unspecified: Secondary | ICD-10-CM | POA: Insufficient documentation

## 2020-10-20 DIAGNOSIS — I4891 Unspecified atrial fibrillation: Secondary | ICD-10-CM | POA: Diagnosis not present

## 2020-10-20 DIAGNOSIS — M549 Dorsalgia, unspecified: Secondary | ICD-10-CM | POA: Diagnosis not present

## 2020-10-20 DIAGNOSIS — Z9049 Acquired absence of other specified parts of digestive tract: Secondary | ICD-10-CM | POA: Insufficient documentation

## 2020-10-20 DIAGNOSIS — E559 Vitamin D deficiency, unspecified: Secondary | ICD-10-CM | POA: Diagnosis not present

## 2020-10-20 DIAGNOSIS — Z7901 Long term (current) use of anticoagulants: Secondary | ICD-10-CM | POA: Diagnosis not present

## 2020-10-20 DIAGNOSIS — I2510 Atherosclerotic heart disease of native coronary artery without angina pectoris: Secondary | ICD-10-CM | POA: Diagnosis not present

## 2020-10-20 DIAGNOSIS — Z809 Family history of malignant neoplasm, unspecified: Secondary | ICD-10-CM | POA: Insufficient documentation

## 2020-10-20 DIAGNOSIS — I7300 Raynaud's syndrome without gangrene: Secondary | ICD-10-CM | POA: Insufficient documentation

## 2020-10-20 DIAGNOSIS — R5383 Other fatigue: Secondary | ICD-10-CM | POA: Diagnosis not present

## 2020-10-20 DIAGNOSIS — Z853 Personal history of malignant neoplasm of breast: Secondary | ICD-10-CM | POA: Diagnosis not present

## 2020-10-20 DIAGNOSIS — Z87891 Personal history of nicotine dependence: Secondary | ICD-10-CM | POA: Insufficient documentation

## 2020-10-20 DIAGNOSIS — Z882 Allergy status to sulfonamides status: Secondary | ICD-10-CM | POA: Diagnosis not present

## 2020-10-20 DIAGNOSIS — Z79899 Other long term (current) drug therapy: Secondary | ICD-10-CM | POA: Insufficient documentation

## 2020-10-20 LAB — CBC WITH DIFFERENTIAL/PLATELET
Abs Immature Granulocytes: 0.01 10*3/uL (ref 0.00–0.07)
Basophils Absolute: 0 10*3/uL (ref 0.0–0.1)
Basophils Relative: 1 %
Eosinophils Absolute: 0.1 10*3/uL (ref 0.0–0.5)
Eosinophils Relative: 2 %
HCT: 39 % (ref 36.0–46.0)
Hemoglobin: 12.1 g/dL (ref 12.0–15.0)
Immature Granulocytes: 0 %
Lymphocytes Relative: 15 %
Lymphs Abs: 0.9 10*3/uL (ref 0.7–4.0)
MCH: 30.9 pg (ref 26.0–34.0)
MCHC: 31 g/dL (ref 30.0–36.0)
MCV: 99.7 fL (ref 80.0–100.0)
Monocytes Absolute: 0.5 10*3/uL (ref 0.1–1.0)
Monocytes Relative: 8 %
Neutro Abs: 4.7 10*3/uL (ref 1.7–7.7)
Neutrophils Relative %: 74 %
Platelets: 100 10*3/uL — ABNORMAL LOW (ref 150–400)
RBC: 3.91 MIL/uL (ref 3.87–5.11)
RDW: 14.6 % (ref 11.5–15.5)
WBC: 6.3 10*3/uL (ref 4.0–10.5)
nRBC: 0 % (ref 0.0–0.2)

## 2020-10-20 LAB — VITAMIN D 25 HYDROXY (VIT D DEFICIENCY, FRACTURES): Vit D, 25-Hydroxy: 53.36 ng/mL (ref 30–100)

## 2020-10-22 ENCOUNTER — Ambulatory Visit (HOSPITAL_COMMUNITY): Payer: Medicare Other | Admitting: Physician Assistant

## 2020-10-22 DIAGNOSIS — K219 Gastro-esophageal reflux disease without esophagitis: Secondary | ICD-10-CM | POA: Diagnosis not present

## 2020-10-24 NOTE — Progress Notes (Signed)
[address], [place] [address]   CLINIC:  Medical Oncology/Hematology  PCP:  Celene Squibb, MD [place] [address] [phone]   REASON FOR VISIT:  Follow-up for thrombocytopenia  PRIOR THERAPY: None  CURRENT THERAPY: Observation  INTERVAL HISTORY:  Patricia Jackson 80 y.o. female returns for routine follow-up of her thrombocytopenia.  She was last seen by Dr. Delton Coombes on 04/09/2020.  At today's visit, she reports feeling well.  No recent hospitalizations, surgeries, or changes in baseline health status.  She continues to take Eliquis for her atrial fibrillation, but denies any bleeding events such as epistaxis, hematemesis, hematochezia, or melena.  She does bruise easily but denies any petechial rash.  She denies abdominal pain and early satiety.  No B symptoms such as fever, chills, night sweats, unintentional weight loss.  She continues to have intermittent Raynaud's of her fingers and toes, which can occur with a variety of triggers per her report.  She has chronic complaints of fatigue, shortness of breath on exertion (underlying COPD), constipation, and back pain.  She has 40% energy and 80% appetite. She endorses that she is maintaining a stable weight, but with fluctuations up and down within the same 10 pound weight range over the past several years.   REVIEW OF SYSTEMS:  Review of Systems  Constitutional:  Positive for fatigue. Negative for appetite change, chills, diaphoresis, fever and unexpected weight change.  HENT:   Negative for lump/mass and nosebleeds.        Trouble chewing due to teeth  Eyes:  Negative for eye problems.  Respiratory:  Positive for shortness of breath (with exertion, stable, COPD). Negative for cough and hemoptysis.   Cardiovascular:  Negative for chest pain, leg swelling and palpitations.  Gastrointestinal:  Positive for constipation. Negative for abdominal pain, blood in stool, diarrhea,  nausea and vomiting.  Genitourinary:  Negative for hematuria.   Skin: Negative.   Neurological:  Positive for dizziness and numbness. Negative for headaches and light-headedness.  Hematological:  Bruises/bleeds easily (easy bruising, no bleeding).     PAST MEDICAL/SURGICAL HISTORY:  Past Medical History:  Diagnosis Date   A-fib [facility])    Atrial flutter ([place])    On Eliquis in 8/17 but discontinued after stent placement   Breast cancer ([place])    remote   CAD in native artery 03/16/2016   COPD (chronic obstructive pulmonary disease) ([place])    Hypertension    S/P angioplasty with stent 03/15/16 DES [place], pLCX 03/16/2016   Past Surgical History:  Procedure Laterality Date   APPENDECTOMY     BREAST SURGERY Left    CARDIAC CATHETERIZATION N/A 03/15/2016   Procedure: Left Heart Cath and Coronary Angiography;  Surgeon: Belva Crome, MD;  Location: [place] CV LAB;  Service: Cardiovascular;  Laterality: N/A;   CARDIAC CATHETERIZATION N/A 03/15/2016   Procedure: Coronary Stent Intervention;  Surgeon: Belva Crome, MD;  Location: [place] CV LAB;  Service: Cardiovascular;  Laterality: N/A;   COLONOSCOPY     remote   COLONOSCOPY N/A 05/26/2016   Procedure: COLONOSCOPY;  Surgeon: Daneil Dolin, MD;  [facility];  Service: Endoscopy;  Laterality: N/A;  845   ESOPHAGOGASTRODUODENOSCOPY N/A 05/26/2016   Procedure: ESOPHAGOGASTRODUODENOSCOPY (EGD);  Surgeon: Daneil Dolin, MD;  Location[facility];  Service: Endoscopy;  Laterality: N/A;   MALONEY DILATION N/A 05/26/2016   Procedure: Venia Minks DILATION;  Surgeon: Daneil Dolin, MD;  [facility];  Service: Endoscopy;  Laterality: N/A;   VITRECTOMY Left 10/03/2019   Dr. Zadie Rhine, Vitrectomy, Focal Laser, Removal of Silicone Oil     SOCIAL HISTORY:  Social History   Socioeconomic History   Marital status: Divorced    Spouse name: Not on file   Number of children: 1   Years of education: Not on file    Highest education level: Not on file  Occupational History   Occupation: Retired  Tobacco Use   Smoking status: Former    Types: Cigarettes    Quit date: 12/28/2014    Years since quitting: 5.8   Smokeless tobacco: Never   Tobacco comments:    Patient quit within the last 10 years  Vaping Use   Vaping Use: Never used  Substance and Sexual Activity   Alcohol use: Never   Drug use: Never   Sexual activity: Not Currently  Other Topics Concern   Not on file  Social History Narrative   ** Merged History Encounter **       Social Determinants of Health   Financial Resource Strain: Not on file  Food Insecurity: Not on file  Transportation Needs: Not on file  Physical Activity: Not on file  Stress: Not on file  Social Connections: Not on file  Intimate Partner Violence: Not on file    FAMILY HISTORY:  Family History  Problem Relation Age of Onset   Heart disease Mother    COPD Father    Cancer Sister        unknown primary   Cancer Brother        unknown primary   Cancer Brother    Hypertension Son    Colon cancer Neg Hx     CURRENT MEDICATIONS:  Outpatient Encounter Medications as of 10/27/2020  Medication Sig   acetaminophen (TYLENOL) 500 MG tablet Take 1,500 mg by mouth every 4 (four) hours as needed.   albuterol (PROVENTIL) (2.5 MG/3ML) 0.083% nebulizer solution Take 2.5 mg by nebulization every 6 (six) hours as needed for wheezing or shortness of breath.   albuterol (VENTOLIN HFA) 108 (90 Base) MCG/ACT inhaler Inhale 2 puffs into the lungs every 6 (six) hours as needed for wheezing or shortness of breath.   amLODipine-olmesartan (AZOR) 10-40 MG tablet Take 1 tablet by mouth every morning.   apixaban (ELIQUIS) 5 MG TABS tablet Take 1 tablet (5 mg total) by mouth 2 (two) times daily.   buPROPion (WELLBUTRIN XL) 300 MG 24 hr tablet Take 300 mg by mouth daily.   Cholecalciferol (VITAMIN D3) 50 MCG (2000 UT) TABS Take 2,000 Units by mouth daily.   denosumab (PROLIA) 60  MG/ML SOSY injection Inject 60 mg into the skin every 6 (six) months.   ferrous sulfate 325 (65 FE) MG tablet Take 325 mg by mouth daily with breakfast.    fluvoxaMINE (LUVOX) 100 MG tablet Take 1 tablet (100 mg total) by mouth 2 (two) times daily.   levothyroxine (SYNTHROID) 50 MCG tablet Take 50 mcg by mouth daily before breakfast.    metoprolol succinate (TOPROL-XL) 50 MG 24 hr tablet Take 50 mg by mouth daily.   nitroGLYCERIN (NITROSTAT) 0.4 MG SL tablet Place 0.4 mg under the tongue every 5 (five) minutes as needed for chest pain.    rosuvastatin (CRESTOR) 20 MG tablet Take 20 mg by mouth daily.    sertraline (ZOLOFT) 25 MG tablet Take 25 mg by mouth daily.   TRELEGY ELLIPTA 200-62.5-25 MCG/INH AEPB Inhale 1 puff  into the lungs daily.   No facility-administered encounter medications on file as of 10/27/2020.    ALLERGIES:  Allergies  Allergen Reactions   Sulfa Antibiotics Hives   Sulfa Antibiotics Hives and Other (See Comments)    Reaction not recalled by the patient, but "hives" were noted in her other profile in Epic     PHYSICAL EXAM:  ECOG PERFORMANCE STATUS: 1 - Symptomatic but completely ambulatory  There were no vitals filed for this visit. There were no vitals filed for this visit. Physical Exam Constitutional:      Appearance: Normal appearance.  HENT:     Head: Normocephalic and atraumatic.     Mouth/Throat:     Mouth: Mucous membranes are moist.  Eyes:     Extraocular Movements: Extraocular movements intact.     Pupils: Pupils are equal, round, and reactive to light.  Cardiovascular:     Rate and Rhythm: Normal rate and regular rhythm.     Pulses: Normal pulses.     Heart sounds: Normal heart sounds.  Pulmonary:     Effort: Pulmonary effort is normal.     Breath sounds: Normal breath sounds.  Abdominal:     General: Bowel sounds are normal.     Palpations: Abdomen is soft.     Tenderness: There is no abdominal tenderness.  Musculoskeletal:         General: No swelling.     Right lower leg: No edema.     Left lower leg: No edema.  Lymphadenopathy:     Cervical: No cervical adenopathy.  Skin:    General: Skin is warm and dry.  Neurological:     General: No focal deficit present.     Mental Status: She is alert and oriented to person, place, and time.  Psychiatric:        Mood and Affect: Mood normal.        Behavior: Behavior normal.     LABORATORY DATA:  I have reviewed the labs as listed.  CBC    Component Value Date/Time   WBC 6.3 10/20/2020 0825   RBC 3.91 10/20/2020 0825   HGB 12.1 10/20/2020 0825   HCT 39.0 10/20/2020 0825   PLT 100 (L) 10/20/2020 0825   MCV 99.7 10/20/2020 0825   MCH 30.9 10/20/2020 0825   MCHC 31.0 10/20/2020 0825   RDW 14.6 10/20/2020 0825   LYMPHSABS 0.9 10/20/2020 0825   MONOABS 0.5 10/20/2020 0825   EOSABS 0.1 10/20/2020 0825   BASOSABS 0.0 10/20/2020 0825   CMP Latest Ref Rng & Units 07/06/2019 07/04/2019 08/27/2017  Glucose 70 - 99 mg/dL 104(H) 94 95  BUN 8 - 23 mg/dL 26(H) 33(H) 30(H)  Creatinine 0.44 - 1.00 mg/dL 1.53(H) 1.84(H) 1.20(H)  Sodium 135 - 145 mmol/L 141 139 141  Potassium 3.5 - 5.1 mmol/L 4.5 4.4 4.7  Chloride 98 - 111 mmol/L 114(H) 109 110  CO2 22 - 32 mmol/L 22 20(L) 25  Calcium 8.9 - 10.3 mg/dL 9.1 8.9 9.1  Total Protein 6.5 - 8.1 g/dL - 6.8 -  Total Bilirubin 0.3 - 1.2 mg/dL - 0.5 -  Alkaline Phos 38 - 126 U/L - 66 -  AST 15 - 41 U/L - 40 -  ALT 0 - 44 U/L - 27 -    DIAGNOSTIC IMAGING:  I have independently reviewed the relevant imaging and discussed with the patient.  ASSESSMENT & PLAN: 1.  Thrombocytopenia, mild to moderate - Seen at the request of Dr. Nevada Crane for  platelet count of 62.  She has some easy bruising. - She is on Eliquis for atrial fibrillation. - Hematologic work-up showed SPEP negative.  B12, methylmalonic acid, copper and folic acid levels were normal.  LDH was normal.  White count and platelet count was normal. - ANA was positive with a  dilution of 1 and 1280.  (She has positive Raynaud's phenomena.  Evaluated by rheumatology.)  Rheumatoid factor is negative.  Hepatitis panel was negative. -She admits to easy bruising but denies any signs or symptoms of blood loss - Most recent labs (10/20/2020): Platelets 100 (stable at baseline), normal WBC, hemoglobin, and differential - Differential diagnosis favors ITP versus early MDS - PLAN: RTC in 6 months for repeat labs and monitoring.  Will consider bone marrow biopsy if any significant deviations from baseline or if patient develops B symptoms.  2.  Vitamin D deficiency - Patient was started on vitamin D 1000 units daily at her last visit - Most recent labs (10/20/2020): Vitamin D improved at 53.36 - PLAN: Continue vitamin D supplementation  3.  Tobacco abuse - Smokes 2 PPD x60 years - CT chest lung cancer screening on 02/20/2020 was lung RADS 2 - PLAN: Patient will be due for LDCT scan in November 2022   [place]: Labs and RTC in 6 months LDCT scan November 2022   All questions were answered. The patient knows to call the clinic with any problems, questions or concerns.  Medical decision making: Low  Time spent on visit: I spent 15 minutes counseling the patient face to face. The total time spent in the appointment was 25 minutes and more than 50% was on counseling.   Harriett Rush, PA-C  10/27/2020 2:50 PM

## 2020-10-27 ENCOUNTER — Inpatient Hospital Stay (HOSPITAL_BASED_OUTPATIENT_CLINIC_OR_DEPARTMENT_OTHER): Payer: Medicare Other | Admitting: Physician Assistant

## 2020-10-27 ENCOUNTER — Other Ambulatory Visit: Payer: Self-pay

## 2020-10-27 VITALS — BP 106/72 | HR 63 | Temp 96.8°F | Resp 20 | Wt 149.5 lb

## 2020-10-27 DIAGNOSIS — R5383 Other fatigue: Secondary | ICD-10-CM | POA: Diagnosis not present

## 2020-10-27 DIAGNOSIS — Z853 Personal history of malignant neoplasm of breast: Secondary | ICD-10-CM | POA: Diagnosis not present

## 2020-10-27 DIAGNOSIS — Z836 Family history of other diseases of the respiratory system: Secondary | ICD-10-CM | POA: Diagnosis not present

## 2020-10-27 DIAGNOSIS — R200 Anesthesia of skin: Secondary | ICD-10-CM | POA: Diagnosis not present

## 2020-10-27 DIAGNOSIS — Z809 Family history of malignant neoplasm, unspecified: Secondary | ICD-10-CM | POA: Diagnosis not present

## 2020-10-27 DIAGNOSIS — Z9049 Acquired absence of other specified parts of digestive tract: Secondary | ICD-10-CM | POA: Diagnosis not present

## 2020-10-27 DIAGNOSIS — I7300 Raynaud's syndrome without gangrene: Secondary | ICD-10-CM | POA: Diagnosis not present

## 2020-10-27 DIAGNOSIS — J449 Chronic obstructive pulmonary disease, unspecified: Secondary | ICD-10-CM | POA: Diagnosis not present

## 2020-10-27 DIAGNOSIS — I2510 Atherosclerotic heart disease of native coronary artery without angina pectoris: Secondary | ICD-10-CM | POA: Diagnosis not present

## 2020-10-27 DIAGNOSIS — D696 Thrombocytopenia, unspecified: Secondary | ICD-10-CM | POA: Diagnosis not present

## 2020-10-27 DIAGNOSIS — K5900 Constipation, unspecified: Secondary | ICD-10-CM | POA: Diagnosis not present

## 2020-10-27 DIAGNOSIS — E559 Vitamin D deficiency, unspecified: Secondary | ICD-10-CM

## 2020-10-27 DIAGNOSIS — Z7901 Long term (current) use of anticoagulants: Secondary | ICD-10-CM | POA: Diagnosis not present

## 2020-10-27 DIAGNOSIS — R42 Dizziness and giddiness: Secondary | ICD-10-CM | POA: Diagnosis not present

## 2020-10-27 DIAGNOSIS — R0602 Shortness of breath: Secondary | ICD-10-CM | POA: Diagnosis not present

## 2020-10-27 DIAGNOSIS — Z87891 Personal history of nicotine dependence: Secondary | ICD-10-CM | POA: Diagnosis not present

## 2020-10-27 DIAGNOSIS — I4891 Unspecified atrial fibrillation: Secondary | ICD-10-CM | POA: Diagnosis not present

## 2020-10-27 DIAGNOSIS — Z8249 Family history of ischemic heart disease and other diseases of the circulatory system: Secondary | ICD-10-CM | POA: Diagnosis not present

## 2020-10-27 DIAGNOSIS — M549 Dorsalgia, unspecified: Secondary | ICD-10-CM | POA: Diagnosis not present

## 2020-10-27 DIAGNOSIS — Z882 Allergy status to sulfonamides status: Secondary | ICD-10-CM | POA: Diagnosis not present

## 2020-10-27 DIAGNOSIS — Z79899 Other long term (current) drug therapy: Secondary | ICD-10-CM | POA: Diagnosis not present

## 2020-10-27 NOTE — Patient Instructions (Signed)
[place] at [facility] Discharge Instructions  You were seen today by Tarri Abernethy PA-C for your low platelets.    Your platelets remain mildly low, but they are stable at your baseline.  Your low platelets are most likely due to immune dysfunction, but they may also be due to early signs of bone marrow disease.  Because of this, we will continue to monitor your platelets.  LABS: Return in 6 months for repeat labs  OTHER TESTS: Low dose CT chest for lung cancer screening  MEDICATIONS: Continue vitamin D supplement  FOLLOW-UP APPOINTMENT: Office visit in 6 months   Thank you for choosing [place] at [facility] to provide your oncology and hematology care.  To afford each patient quality time with our provider, please arrive at least 15 minutes before your scheduled appointment time.   If you have a lab appointment with the [facility] please come in thru the Main Entrance and check in at the main information desk.  You need to re-schedule your appointment should you arrive 10 or more minutes late.  We strive to give you quality time with our providers, and arriving late affects you and other patients whose appointments are after yours.  Also, if you no show three or more times for appointments you may be dismissed from the clinic at the providers discretion.     Again, thank you for choosing [facility].  Our hope is that these requests will decrease the amount of time that you wait before being seen by our physicians.       _____________________________________________________________  Should you have questions after your visit to [facility], please contact our office at [phone] and follow the prompts.  Our office hours are 8:00 a.m. and 4:30 p.m. Monday - Friday.  Please note that voicemails left after 4:00 p.m. may not be returned until the following business day.  We are closed weekends and major  holidays.  You do have access to a nurse 24-7, just call the main number to the clinic [phone] and do not press any options, hold on the line and a nurse will answer the phone.    For prescription refill requests, have your pharmacy contact our office and allow 72 hours.    Due to Covid, you will need to wear a mask upon entering the hospital. If you do not have a mask, a mask will be given to you at the Main Entrance upon arrival. For doctor visits, patients may have 1 support person age 46 or older with them. For treatment visits, patients can not have anyone with them due to social distancing guidelines and our immunocompromised population.

## 2020-11-04 ENCOUNTER — Other Ambulatory Visit: Payer: Self-pay

## 2020-11-04 ENCOUNTER — Ambulatory Visit (INDEPENDENT_AMBULATORY_CARE_PROVIDER_SITE_OTHER): Payer: Medicare Other | Admitting: Ophthalmology

## 2020-11-04 ENCOUNTER — Encounter (INDEPENDENT_AMBULATORY_CARE_PROVIDER_SITE_OTHER): Payer: Self-pay | Admitting: Ophthalmology

## 2020-11-04 DIAGNOSIS — H35022 Exudative retinopathy, left eye: Secondary | ICD-10-CM | POA: Diagnosis not present

## 2020-11-04 DIAGNOSIS — H353211 Exudative age-related macular degeneration, right eye, with active choroidal neovascularization: Secondary | ICD-10-CM

## 2020-11-04 DIAGNOSIS — H3561 Retinal hemorrhage, right eye: Secondary | ICD-10-CM | POA: Insufficient documentation

## 2020-11-04 DIAGNOSIS — H35021 Exudative retinopathy, right eye: Secondary | ICD-10-CM

## 2020-11-04 DIAGNOSIS — H3562 Retinal hemorrhage, left eye: Secondary | ICD-10-CM

## 2020-11-04 MED ORDER — BEVACIZUMAB 2.5 MG/0.1ML IZ SOSY
2.5000 mg | PREFILLED_SYRINGE | INTRAVITREAL | Status: AC | PRN
  Administered 2020-11-04: 2.5 mg via INTRAVITREAL

## 2020-11-04 NOTE — Assessment & Plan Note (Addendum)
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

## 2020-11-04 NOTE — Progress Notes (Signed)
11/04/2020     CHIEF COMPLAINT Patient presents for Eye Problem (WIP- darkness OD x1 week. Patient was last seen 11/22/2019. /Patient states she experienced a change in vision in her right eye, states it is dark. She noticed it changing over a couple weeks while watching t.v. So far it has worsened a little over 1 week./Patient states it is mostly in the inner corner of her eye./)   HISTORY OF PRESENT ILLNESS: Patricia Jackson is a 80 y.o. female who presents to the clinic today for:   HPI     Eye Problem           Comments: WIP- darkness OD x1 week. Patient was last seen 11/22/2019.  Patient states she experienced a change in vision in her right eye, states it is dark. She noticed it changing over a couple weeks while watching t.v. So far it has worsened a little over 1 week. Patient states it is mostly in the inner corner of her eye.        Last edited by Laurin Coder, COA on 11/04/2020  2:17 PM.      Referring physician: [facility], [place],  [place] [address]  HISTORICAL INFORMATION:   Selected notes from the MEDICAL RECORD NUMBER    Lab Results  Component Value Date   HGBA1C 5.6 10/30/2015     CURRENT MEDICATIONS: No current outpatient medications on file. (Ophthalmic Drugs)   No current facility-administered medications for this visit. (Ophthalmic Drugs)   Current Outpatient Medications (Other)  Medication Sig   acetaminophen (TYLENOL) 500 MG tablet Take 1,500 mg by mouth every 4 (four) hours as needed. (Patient not taking: Reported on 10/27/2020)   albuterol (PROVENTIL) (2.5 MG/3ML) 0.083% nebulizer solution Take 2.5 mg by nebulization every 6 (six) hours as needed for wheezing or shortness of breath. (Patient not taking: Reported on 10/27/2020)   albuterol (VENTOLIN HFA) 108 (90 Base) MCG/ACT inhaler Inhale 2 puffs into the lungs every 6 (six) hours as needed for wheezing or shortness of breath. (Patient not taking: Reported on 10/27/2020)    amLODipine-olmesartan (AZOR) 10-40 MG tablet Take 1 tablet by mouth every morning.   apixaban (ELIQUIS) 5 MG TABS tablet Take 1 tablet (5 mg total) by mouth 2 (two) times daily.   buPROPion (WELLBUTRIN XL) 300 MG 24 hr tablet Take 300 mg by mouth daily.   Cholecalciferol (VITAMIN D3) 50 MCG (2000 UT) TABS Take 2,000 Units by mouth daily.   denosumab (PROLIA) 60 MG/ML SOSY injection Inject 60 mg into the skin every 6 (six) months.   ferrous sulfate 325 (65 FE) MG tablet Take 325 mg by mouth daily with breakfast.    fluvoxaMINE (LUVOX) 100 MG tablet Take 1 tablet (100 mg total) by mouth 2 (two) times daily.   levothyroxine (SYNTHROID) 50 MCG tablet Take 50 mcg by mouth daily before breakfast.    metoprolol succinate (TOPROL-XL) 25 MG 24 hr tablet Take 25 mg by mouth daily.   metoprolol succinate (TOPROL-XL) 50 MG 24 hr tablet Take 50 mg by mouth daily.   nitroGLYCERIN (NITROSTAT) 0.4 MG SL tablet Place 0.4 mg under the tongue every 5 (five) minutes as needed for chest pain.  (Patient not taking: Reported on 10/27/2020)   rosuvastatin (CRESTOR) 20 MG tablet Take 20 mg by mouth daily.    sertraline (ZOLOFT) 25 MG tablet Take 25 mg by mouth daily.   TRELEGY ELLIPTA 200-62.5-25 MCG/INH AEPB Inhale 1 puff into the lungs  daily.   No current facility-administered medications for this visit. (Other)      REVIEW OF SYSTEMS:    ALLERGIES Allergies  Allergen Reactions   Sulfa Antibiotics Hives   Sulfa Antibiotics Hives and Other (See Comments)    Reaction not recalled by the patient, but "hives" were noted in her other profile in Epic    PAST MEDICAL HISTORY Past Medical History:  Diagnosis Date   A-fib [facility])    Atrial flutter ([place])    On Eliquis in 8/17 but discontinued after stent placement   Breast cancer ([place])    remote   CAD in native artery 03/16/2016   COPD (chronic obstructive pulmonary disease) ([place])    Hypertension    S/P angioplasty with stent 03/15/16 DES Resolute, pLCX  03/16/2016   Vitreous hemorrhage of left eye ([place]) 08/01/2019   Past Surgical History:  Procedure Laterality Date   APPENDECTOMY     BREAST SURGERY Left    CARDIAC CATHETERIZATION N/A 03/15/2016   Procedure: Left Heart Cath and Coronary Angiography;  Surgeon: Belva Crome, MD;  Location: [place] [facility];  Service: Cardiovascular;  Laterality: N/A;   CARDIAC CATHETERIZATION N/A 03/15/2016   Procedure: Coronary Stent Intervention;  Surgeon: Belva Crome, MD;  Location: [place] [facility];  Service: Cardiovascular;  Laterality: N/A;   COLONOSCOPY     remote   COLONOSCOPY N/A 05/26/2016   Procedure: COLONOSCOPY;  Surgeon: Daneil Dolin, MD;  [facility];  Service: Endoscopy;  Laterality: N/A;  845   ESOPHAGOGASTRODUODENOSCOPY N/A 05/26/2016   Procedure: ESOPHAGOGASTRODUODENOSCOPY (EGD);  Surgeon: Daneil Dolin, MD;  [facility];  Service: Endoscopy;  Laterality: N/A;   MALONEY DILATION N/A 05/26/2016   Procedure: Venia Minks DILATION;  Surgeon: Daneil Dolin, MD;  [facility];  Service: Endoscopy;  Laterality: N/A;   VITRECTOMY Left 10/03/2019   Dr. Zadie Rhine, Vitrectomy, Focal Laser, Removal of Silicone Oil    FAMILY HISTORY Family History  Problem Relation Age of Onset   Heart disease Mother    COPD Father    Cancer Sister        unknown primary   Cancer Brother        unknown primary   Cancer Brother    Hypertension Son    Colon cancer Neg Hx     SOCIAL HISTORY Social History   Tobacco Use   Smoking status: Former    Types: Cigarettes    Quit date: 12/28/2014    Years since quitting: 5.8   Smokeless tobacco: Never   Tobacco comments:    Patient quit within the last 10 years  Vaping Use   Vaping Use: Never used  Substance Use Topics   Alcohol use: Never   Drug use: Never         OPHTHALMIC EXAM:  Base Eye Exam     Visual Acuity (ETDRS)       Right Left   Dist [place] 20/250 20/50 -2   Dist ph [place] NI          Tonometry  (Tonopen, 2:18 PM)       Right Left   Pressure 7 9         Pupils       Pupils Dark Light Shape React APD   Right PERRL 6 5 Round Brisk None   Left PERRL 6 5 Round Brisk None         Visual Fields (Counting fingers)  Left Right   Restrictions Partial outer superior temporal, superior nasal, inferior nasal deficiencies Total superior nasal, inferior nasal deficiencies; Partial outer superior temporal, inferior temporal deficiencies         Extraocular Movement       Right Left    Full Full         Neuro/Psych     Oriented x3: Yes   Mood/Affect: Normal         Dilation     Right eye: 1.0% Mydriacyl, 2.5% Phenylephrine @ 2:20 PM           Slit Lamp and Fundus Exam     External Exam       Right Left   External Normal Normal         Slit Lamp Exam       Right Left   Lids/Lashes Normal Normal   Conjunctiva/Sclera White and quiet White and quiet   Cornea Clear Clear   Anterior Chamber Deep and quiet Deep and quiet   Iris Round and reactive Round and reactive   Lens Posterior chamber intraocular lens Posterior chamber intraocular lens   Anterior Vitreous Normal Normal         Fundus Exam       Right Left   Posterior Vitreous Normal, clear no breakthrough hemorrhage Clear, vitrectomized, no oil   Disc Normal Normal   C/D Ratio 0.4 0.25   Macula Hemorrhagic sub-RPE and subretinal blood Normal   Vessels Normal Normal   Periphery  peripheral panretinal photocoagulation, with new overlying peripheral hemorrhagic choroidopathy [facility]) in regions of previous peripheral PRP to the choroid and attempt to prevent this type of hemorrhagic disease.  Large subretinal hemorrhage extending from the 8:30 position anteriorly to the 12 o'clock position superiorly and posteriorly into the macula much of it subretinal some of it sub-RPE ,with extension now posteriorly and into the macula and nearing the FAZ Peripheral chorioretinal scars of laser retinopexy  as well as previous sites of hemorrhagic choroidopathy,, no hemorrhage, good laser photocoagulation, retina attached.,  Clear media            IMAGING AND PROCEDURES  Imaging and Procedures for 11/04/20  OCT, Retina - OU - Both Eyes       Right Eye Quality was good. Scan locations included subfoveal. Central Foveal Thickness: 594. Progression has worsened. Findings include subretinal hyper-reflective material, abnormal foveal contour.   Left Eye Quality was good. Scan locations included subfoveal. Central Foveal Thickness: 270. Findings include abnormal foveal contour, retinal drusen , no SRF, no IRF.   Notes Massive subretinal hemorrhage OD  OS near normal no signs of complications     Color Fundus Photography Optos - OU - Both Eyes       Right Eye Progression has improved. Disc findings include normal observations. Macula : normal observations. Vessels : normal observations. Periphery : hemorrhage.   Left Eye Progression has been stable. Disc findings include normal observations. Macula : normal observations. Vessels : normal observations.   Notes Subretinal and choroidal hemorrhage of PECHR, temporally, recurrent and massive with sub-RPE and subretinal blood from the 8:00 to the 12 o'clock position anteriorly as well as extending posteriorly into the macula nearing the FAZ OD OS clear media and attached after repair of retinal detachment from [facility] some years previous     Intravitreal Injection, Pharmacologic Agent - OD - Right Eye       Time Out 11/04/2020. 2:53 PM. Confirmed correct patient, procedure, site, and patient  consented.   Anesthesia Topical anesthesia was used. Anesthetic medications included Akten 3.5%.   Procedure Preparation included 10% betadine to eyelids, 5% betadine to ocular surface, Tobramycin 0.3%. A 30 gauge needle was used.   Injection: 2.5 mg bevacizumab 2.5 MG/0.1ML   Route: Intravitreal, Site: Right Eye   NDC: [phone], Lot:  [id number]   Post-op Post injection exam found visual acuity of at least counting fingers. The patient tolerated the procedure well. There were no complications. The patient received written and verbal post procedure care education. Post injection medications were not given.              ASSESSMENT/PLAN:  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HCC) Associated CNVM with sub-RPE and subretinal hemorrhage with exudative detachment of PECHR OD new active, will commence with therapy antivegF, intravitreal Avastin today  Exudative retinopathy of right eye Exudative CNVM, sub-RPE and some retinal blood massive now recurrent.  Associated with exudative CNVM.  Now involving the macular region.  Will commence with therapy with antivegF     ICD-10-CM   1. Exudative retinopathy of right eye  H35.021 OCT, Retina - OU - Both Eyes    Color Fundus Photography Optos - OU - Both Eyes    2. Exudative retinopathy of left eye  H35.022 Color Fundus Photography Optos - OU - Both Eyes    3. 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HCC)  H35.3211 Intravitreal Injection, Pharmacologic Agent - OD - Right Eye    bevacizumab (AVASTIN) SOSY 2.5 mg    4. Retinal hemorrhage of left eye  H35.62     5. Retinal hemorrhage, right eye  H35.61 OCT, Retina - OU - Both Eyes      1.  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  Subretinal and sub-RPE hemorrhage extends into the macular region accounting for acuity, will treat as exudative CNVM wet AMD with antivegF intravitreal Avastin commencement with therapy today  2.  Patient understands of breakthrough hemorrhage occurs with vitreous hemorrhage in the right eye may very well need to proceed to surgical intervention at that time, yet not indicated  at this time since the hemorrhage is subretinal and sub-RPE with no true rhegmatogenous or traction retinal detachment  3.  Patient does continue on Eliquis for life-threatening protection from the consequences of atrial fibrillation thus cannot discontinue blood thinners.  Ophthalmic Meds Ordered this visit:  Meds ordered this encounter  Medications   bevacizumab (AVASTIN) SOSY 2.5 mg       Return in about 1 month (around 12/05/2020) for COLOR FP, dilate, OD, AVASTIN OCT.  There are no Patient Instructions on file for this visit.   Explained the diagnoses, plan, and follow up with the patient and they expressed understanding.  Patient expressed understanding of the importance of proper follow up care.   Clent Demark Kroy Sprung M.D. Diseases & Surgery of the Retina and Vitreous Retina [facility] Diabetic [place] 11/04/20     Abbreviations: M myopia (nearsighted); A astigmatism; H hyperopia (farsighted); P presbyopia; Mrx spectacle prescription;  CTL contact lenses; OD right eye; OS left eye; OU both eyes  XT exotropia; ET esotropia; PEK punctate epithelial keratitis; PEE punctate epithelial erosions; DES dry eye syndrome; MGD meibomian gland dysfunction; ATs artificial tears; PFAT's preservative free artificial tears; [place] nuclear sclerotic cataract; PSC posterior subcapsular cataract; ERM epi-retinal membrane; PVD posterior vitreous detachment; RD  retinal detachment; DM diabetes mellitus; DR diabetic retinopathy; NPDR non-proliferative diabetic retinopathy; PDR proliferative diabetic retinopathy; CSME clinically significant macular edema; DME diabetic macular edema; dbh dot blot hemorrhages; CWS cotton wool spot; POAG primary open angle glaucoma; C/D cup-to-disc ratio; HVF humphrey visual field; GVF goldmann visual field; OCT optical coherence tomography; IOP intraocular pressure; BRVO Branch retinal vein occlusion; CRVO central retinal vein occlusion; CRAO central retinal artery occlusion; BRAO branch  retinal artery occlusion; RT retinal tear; SB scleral buckle; PPV pars plana vitrectomy; VH Vitreous hemorrhage; PRP panretinal laser photocoagulation; IVK intravitreal kenalog; VMT vitreomacular traction; MH Macular hole;  NVD neovascularization of the disc; NVE neovascularization elsewhere; AREDS age related eye disease study; ARMD age related macular degeneration; POAG primary open angle glaucoma; EBMD epithelial/anterior basement membrane dystrophy; ACIOL anterior chamber intraocular lens; IOL intraocular lens; PCIOL posterior chamber intraocular lens; Phaco/IOL phacoemulsification with intraocular lens placement; [place] photorefractive keratectomy; LASIK laser assisted in situ keratomileusis; HTN hypertension; DM diabetes mellitus; COPD chronic obstructive pulmonary disease

## 2020-11-04 NOTE — Assessment & Plan Note (Signed)
Exudative CNVM, sub-RPE and some retinal blood massive now recurrent.  Associated with exudative CNVM.  Now involving the macular region.  Will commence with therapy with antivegF

## 2020-11-10 ENCOUNTER — Other Ambulatory Visit: Payer: Self-pay | Admitting: Cardiology

## 2020-11-10 NOTE — Telephone Encounter (Signed)
Prescription refill request for Eliquis received. Indication: Atrial flutter Last office visit: 01/08/20  Zandra Abts MD Scr: 1.28 on 07/23/20 Age:  80 Weight: 70.3kg  Based on above findings Eliquis 80m twice daily is the appropriate dose.  Refill approved.  Pt is past due for appt with Dr BHarl Bowie  Message sent to scheduler.

## 2020-12-09 ENCOUNTER — Encounter (INDEPENDENT_AMBULATORY_CARE_PROVIDER_SITE_OTHER): Payer: Self-pay | Admitting: Ophthalmology

## 2020-12-09 ENCOUNTER — Ambulatory Visit (INDEPENDENT_AMBULATORY_CARE_PROVIDER_SITE_OTHER): Payer: Medicare Other | Admitting: Ophthalmology

## 2020-12-09 ENCOUNTER — Other Ambulatory Visit: Payer: Self-pay

## 2020-12-09 DIAGNOSIS — H35022 Exudative retinopathy, left eye: Secondary | ICD-10-CM

## 2020-12-09 DIAGNOSIS — H353211 Exudative age-related macular degeneration, right eye, with active choroidal neovascularization: Secondary | ICD-10-CM | POA: Diagnosis not present

## 2020-12-09 DIAGNOSIS — H35021 Exudative retinopathy, right eye: Secondary | ICD-10-CM | POA: Diagnosis not present

## 2020-12-09 MED ORDER — BEVACIZUMAB 2.5 MG/0.1ML IZ SOSY
2.5000 mg | PREFILLED_SYRINGE | INTRAVITREAL | Status: AC | PRN
  Administered 2020-12-09: 2.5 mg via INTRAVITREAL

## 2020-12-09 NOTE — Assessment & Plan Note (Signed)
Much improved OD status post injection #1

## 2020-12-09 NOTE — Progress Notes (Signed)
12/09/2020     CHIEF COMPLAINT Patient presents for  Chief Complaint  Patient presents with   Retina Follow Up      HISTORY OF PRESENT ILLNESS: Patricia Jackson is a 80 y.o. female who presents to the clinic today for:   HPI     Retina Follow Up   Patient presents with  Other.  In right eye.  This started 5 weeks ago.  Severity is mild.  Duration of 5 weeks.  Since onset it is stable.        Comments   5 week fu OU and OCT/FP and Avastin OD Pt states VA OU stable since last visit. Pt denies FOL, floaters, or ocular pain OU.  Pt states, "It does seem like my vision comes and goes a lot."  Hx of bilateral PECHR.        Last edited by Hurman Horn, MD on 12/09/2020  3:19 PM.      Referring physician: Celene Squibb, MD [place],  [place] [id number]  HISTORICAL INFORMATION:   Selected notes from the MEDICAL RECORD NUMBER    Lab Results  Component Value Date   HGBA1C 5.6 10/30/2015     CURRENT MEDICATIONS: No current outpatient medications on file. (Ophthalmic Drugs)   No current facility-administered medications for this visit. (Ophthalmic Drugs)   Current Outpatient Medications (Other)  Medication Sig   acetaminophen (TYLENOL) 500 MG tablet Take 1,500 mg by mouth every 4 (four) hours as needed. (Patient not taking: Reported on 10/27/2020)   albuterol (PROVENTIL) (2.5 MG/3ML) 0.083% nebulizer solution Take 2.5 mg by nebulization every 6 (six) hours as needed for wheezing or shortness of breath. (Patient not taking: Reported on 10/27/2020)   albuterol (VENTOLIN HFA) 108 (90 Base) MCG/ACT inhaler Inhale 2 puffs into the lungs every 6 (six) hours as needed for wheezing or shortness of breath. (Patient not taking: Reported on 10/27/2020)   amLODipine-olmesartan (AZOR) 10-40 MG tablet Take 1 tablet by mouth every morning.   buPROPion (WELLBUTRIN XL) 300 MG 24 hr tablet Take 300 mg by mouth daily.   Cholecalciferol (VITAMIN D3) 50 MCG (2000 UT) TABS Take  2,000 Units by mouth daily.   denosumab (PROLIA) 60 MG/ML SOSY injection Inject 60 mg into the skin every 6 (six) months.   ELIQUIS 5 MG TABS tablet TAKE ONE TABLET (5MG TOTAL) BY MOUTH TWOTIMES DAILY   ferrous sulfate 325 (65 FE) MG tablet Take 325 mg by mouth daily with breakfast.    fluvoxaMINE (LUVOX) 100 MG tablet Take 1 tablet (100 mg total) by mouth 2 (two) times daily.   levothyroxine (SYNTHROID) 50 MCG tablet Take 50 mcg by mouth daily before breakfast.    metoprolol succinate (TOPROL-XL) 25 MG 24 hr tablet Take 25 mg by mouth daily.   metoprolol succinate (TOPROL-XL) 50 MG 24 hr tablet Take 50 mg by mouth daily.   nitroGLYCERIN (NITROSTAT) 0.4 MG SL tablet Place 0.4 mg under the tongue every 5 (five) minutes as needed for chest pain.  (Patient not taking: Reported on 10/27/2020)   rosuvastatin (CRESTOR) 20 MG tablet Take 20 mg by mouth daily.    sertraline (ZOLOFT) 25 MG tablet Take 25 mg by mouth daily.   TRELEGY ELLIPTA 200-62.5-25 MCG/INH AEPB Inhale 1 puff into the lungs daily.   No current facility-administered medications for this visit. (Other)      REVIEW OF SYSTEMS:    ALLERGIES Allergies  Allergen Reactions   Sulfa Antibiotics Hives  Sulfa Antibiotics Hives and Other (See Comments)    Reaction not recalled by the patient, but "hives" were noted in her other profile in Epic    PAST MEDICAL HISTORY Past Medical History:  Diagnosis Date   A-fib [facility])    Atrial flutter ([place])    On Eliquis in 8/17 but discontinued after stent placement   Breast cancer ([place])    remote   CAD in native artery 03/16/2016   COPD (chronic obstructive pulmonary disease) ([place])    Hypertension    S/P angioplasty with stent 03/15/16 DES Resolute, pLCX 03/16/2016   Vitreous hemorrhage of left eye ([place]) 08/01/2019   Past Surgical History:  Procedure Laterality Date   APPENDECTOMY     BREAST SURGERY Left    CARDIAC CATHETERIZATION N/A 03/15/2016   Procedure: Left Heart Cath and  Coronary Angiography;  Surgeon: Belva Crome, MD;  Location: [place] CV LAB;  Service: Cardiovascular;  Laterality: N/A;   CARDIAC CATHETERIZATION N/A 03/15/2016   Procedure: Coronary Stent Intervention;  Surgeon: Belva Crome, MD;  Location: [place]  CV LAB;  Service: Cardiovascular;  Laterality: N/A;   COLONOSCOPY     remote   COLONOSCOPY N/A 05/26/2016   Procedure: COLONOSCOPY;  Surgeon: Daneil Dolin, MD;  [facility];  Service: Endoscopy;  Laterality: N/A;  845   ESOPHAGOGASTRODUODENOSCOPY N/A 05/26/2016   Procedure: ESOPHAGOGASTRODUODENOSCOPY (EGD);  Surgeon: Daneil Dolin, MD;  [facility];  Service: Endoscopy;  Laterality: N/A;   MALONEY DILATION N/A 05/26/2016   Procedure: Venia Minks DILATION;  Surgeon: Daneil Dolin, MD;  [facility];  Service: Endoscopy;  Laterality: N/A;   VITRECTOMY Left 10/03/2019   Dr. Zadie Rhine, Vitrectomy, Focal Laser, Removal of Silicone Oil    FAMILY HISTORY Family History  Problem Relation Age of Onset   Heart disease Mother    COPD Father    Cancer Sister        unknown primary   Cancer Brother        unknown primary   Cancer Brother    Hypertension Son    Colon cancer Neg Hx     SOCIAL HISTORY Social History   Tobacco Use   Smoking status: Former    Types: Cigarettes    Quit date: 12/28/2014    Years since quitting: 5.9   Smokeless tobacco: Never   Tobacco comments:    Patient quit within the last 10 years  Vaping Use   Vaping Use: Never used  Substance Use Topics   Alcohol use: Never   Drug use: Never         OPHTHALMIC EXAM:  Base Eye Exam     Visual Acuity (ETDRS)       Right Left   Dist [place] 20/100 -1 20/70 -1   Dist ph [place] NI NI         Tonometry (Tonopen, 2:37 PM)       Right Left   Pressure 09 10         Pupils       Pupils Dark Light Shape React APD   Right PERRL 6 5 Round Brisk None   Left PERRL 6 5 Round Brisk None         Visual Fields       Left Right    Restrictions Partial outer superior temporal, superior nasal, inferior nasal deficiencies Total superior nasal, inferior nasal deficiencies; Partial outer superior temporal, inferior temporal deficiencies  Neuro/Psych     Oriented x3: Yes   Mood/Affect: Normal         Dilation     Both eyes: 1.0% Mydriacyl, 2.5% Phenylephrine @ 2:37 PM           Slit Lamp and Fundus Exam     External Exam       Right Left   External Normal Normal         Slit Lamp Exam       Right Left   Lids/Lashes Normal Normal   Conjunctiva/Sclera White and quiet White and quiet   Cornea Clear Clear   Anterior Chamber Deep and quiet Deep and quiet   Iris Round and reactive Round and reactive   Lens Posterior chamber intraocular lens Posterior chamber intraocular lens   Anterior Vitreous Normal Normal         Fundus Exam       Right Left   Posterior Vitreous Normal, clear no breakthrough hemorrhage Clear, vitrectomized, no oil   Disc Normal Normal   C/D Ratio 0.4 0.25   Macula Hemorrhagic sub-RPE and subretinal blood, less extensive into the posterior pole Normal   Vessels Normal Normal   Periphery  peripheral panretinal photocoagulation, with less active overlying peripheral hemorrhagic choroidopathy [facility]) in regions of previous peripheral PRP to the choroid and attempt to prevent this type of hemorrhagic disease.  Large subretinal hemorrhage extending from the 8:30 position anteriorly to the 12 o'clock position superiorly and posteriorly into the macula much of it subretinal some of it sub-RPE some resolution of the multifocal hemorrhagic choroidopathy Peripheral chorioretinal scars of laser retinopexy as well as previous sites of hemorrhagic choroidopathy,, no hemorrhage, good laser photocoagulation, retina attached.,  Clear media            IMAGING AND PROCEDURES  Imaging and Procedures for 12/09/20  OCT, Retina - OU - Both Eyes       Right Eye Quality was good.  Scan locations included subfoveal. Central Foveal Thickness: 262. Progression has worsened. Findings include abnormal foveal contour, subretinal hyper-reflective material.   Left Eye Quality was good. Scan locations included subfoveal. Central Foveal Thickness: 266. Findings include abnormal foveal contour, retinal drusen , no SRF, no IRF.   Notes Much less subfoveal thickening OD 1 month after Avastin No. 1 for [facility] with extension posteriorly of hemorrhagic choroidopathy and serous retinal detachment  OS near normal no signs of complications     Color Fundus Photography Optos - OU - Both Eyes       Right Eye Progression has improved. Disc findings include normal observations. Macula : normal observations. Vessels : normal observations. Periphery : hemorrhage.   Left Eye Progression has been stable. Disc findings include normal observations. Macula : normal observations. Vessels : normal observations.   Notes Subretinal and choroidal hemorrhage of PECHR, temporally, recurrent and massive with sub-RPE and subretinal blood from the 8:00 to the 12 o'clock position anteriorly as well as extending posteriorly into the macula nearing the FAZ OD OS clear media and attached after repair of retinal detachment from [facility] some years previous     Intravitreal Injection, Pharmacologic Agent - OD - Right Eye       Time Out 12/09/2020. 3:21 PM. Confirmed correct patient, procedure, site, and patient consented.   Anesthesia Topical anesthesia was used. Anesthetic medications included Akten 3.5%.   Procedure Preparation included 10% betadine to eyelids, 5% betadine to ocular surface, Tobramycin 0.3%. A 30 gauge needle was used.  Injection: 2.5 mg bevacizumab 2.5 MG/0.1ML   Route: Intravitreal, Site: Right Eye   NDC: [id number]   Post-op Post injection exam found visual acuity of at least counting fingers. The patient tolerated the procedure well. There were no complications. The patient  received written and verbal post procedure care education. Post injection medications were not given.              ASSESSMENT/PLAN:  Exudative retinopathy of left eye Much improved stable overall OS  Exudative retinopathy of right eye Much improved OD status post injection #1  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HCC) Much less subretinal fluid postinjection #1 Avastin for massive peripheral PECHR with extension posteriorly     ICD-10-CM   1. Exudative retinopathy of right eye  H35.021 OCT, Retina - OU - Both Eyes    Color Fundus Photography Optos - OU - Both Eyes    Intravitreal Injection, Pharmacologic Agent - OD - Right Eye    bevacizumab (AVASTIN) SOSY 2.5 mg    2. Exudative retinopathy of left eye  H35.022     3. 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[place])  H35.3211       1.  History of PECHR, OS with hemorrhagic disease, stabilized and resolved condition  2.  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  Acuity has improved much less subretinal fluid now in the macula, repeat injection today  3.  Ophthalmic Meds Ordered this visit:  Meds ordered this encounter  Medications   bevacizumab (AVASTIN) SOSY 2.5 mg       Return in about 1 month (around 01/08/2021) for dilate, COLOR FP, OCT, AVASTIN OCT, OD.  There are no Patient Instructions on file for this visit.   Explained the diagnoses, plan, and follow up with the patient and they expressed understanding.  Patient expressed understanding of the importance of proper follow up care.   Clent Demark Clark Cuff M.D. Diseases & Surgery of the Retina and Vitreous Retina & Diabetic [place] 12/09/20     Abbreviations: M myopia (nearsighted); A astigmatism; H hyperopia (farsighted); P presbyopia; Mrx spectacle  prescription;  CTL contact lenses; OD right eye; OS left eye; OU both eyes  XT exotropia; ET esotropia; PEK punctate epithelial keratitis; PEE punctate epithelial erosions; DES dry eye syndrome; MGD meibomian gland dysfunction; ATs artificial tears; PFAT's preservative free artificial tears; [place] nuclear sclerotic cataract; PSC posterior subcapsular cataract; ERM epi-retinal membrane; PVD posterior vitreous detachment; RD retinal detachment; DM diabetes mellitus; DR diabetic retinopathy; NPDR non-proliferative diabetic retinopathy; PDR proliferative diabetic retinopathy; CSME clinically significant macular edema; DME diabetic macular edema; dbh dot blot hemorrhages; CWS cotton wool spot; POAG primary open angle glaucoma; C/D cup-to-disc ratio; HVF humphrey visual field; GVF goldmann visual field; OCT optical coherence tomography; IOP intraocular pressure; BRVO Branch retinal vein occlusion; CRVO central retinal vein occlusion; CRAO central retinal artery occlusion; BRAO branch retinal artery occlusion; RT retinal tear; SB scleral buckle; PPV pars plana vitrectomy; VH Vitreous hemorrhage; PRP panretinal laser photocoagulation; IVK intravitreal kenalog; VMT vitreomacular traction; MH Macular hole;  NVD neovascularization of the disc; NVE neovascularization elsewhere; AREDS age related eye disease study; ARMD age related macular degeneration; POAG primary open angle glaucoma; EBMD epithelial/anterior basement membrane dystrophy; ACIOL anterior chamber intraocular lens; IOL intraocular lens; PCIOL posterior chamber intraocular lens; Phaco/IOL phacoemulsification with intraocular lens placement; PRK photorefractive keratectomy; LASIK laser assisted  in situ keratomileusis; HTN hypertension; DM diabetes mellitus; COPD chronic obstructive pulmonary disease

## 2020-12-09 NOTE — Assessment & Plan Note (Signed)
Much improved stable overall OS

## 2020-12-09 NOTE — Assessment & Plan Note (Signed)
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

## 2020-12-10 ENCOUNTER — Ambulatory Visit: Payer: Medicare Other | Admitting: Physician Assistant

## 2020-12-10 DIAGNOSIS — I10 Essential (primary) hypertension: Secondary | ICD-10-CM | POA: Diagnosis present

## 2020-12-10 DIAGNOSIS — R7301 Impaired fasting glucose: Secondary | ICD-10-CM | POA: Diagnosis not present

## 2020-12-15 NOTE — Progress Notes (Signed)
Cardiology Office Note  Date: 12/16/2020   ID: [name], DOB 14-Sep-1940, MRN [id number]  PCP:  Celene Squibb, MD  Cardiologist:  None Electrophysiologist:  None   Chief Complaint: Cardiac follow-up  History of Present Illness: Patricia Jackson is a 80 y.o. female with a history of atrial fibrillation/atrial flutter/CAD, COPD, HTN.  She was last seen by Dr. Harl Bowie 01/08/2020 via telemedicine.  She was having chronic atypical symptoms unchanged related to CAD.  Plan was to continue to monitor.  She had no recent atrial flutter symptoms.  Continuing current medications including anticoagulation.  Her blood pressure was low on her reported home blood pressure readings.  At recent rheumatology appointment blood pressure was 120/70.  Unclear of the accuracy of her home measurement.  Plan was to have her come back for BP check and bring her home cuff with her.  She is here for follow-up today.  She denies any significant issues other than chronic shortness of breath.  He states her trilogy dosage was recently increased.  She does have chronic dyspnea secondary to COPD and is not very active.  She denies any anginal or exertional symptoms.  She does have occasional palpitations.  Heart rate today is 60.  Blood pressure 110/60.  She denies any orthostatic symptoms, CVA or TIA-like symptoms, PND, orthopnea.  Denies any bleeding issues.  No claudication-like symptoms, DVT or PE-like symptoms.  States she has an upcoming appointment with Dr. Nevada Crane with recent lab work.  She has an upcoming low-dose chest CT for lung cancer screening by pulmonology in November.  Past Medical History:  Diagnosis Date   A-fib [facility])    Atrial flutter ([place])    On Eliquis in 8/17 but discontinued after stent placement   Breast cancer ([place])    remote   CAD in native artery 03/16/2016   COPD (chronic obstructive pulmonary disease) ([place])    Hypertension    S/P angioplasty with stent 03/15/16 DES Resolute, pLCX  03/16/2016   Vitreous hemorrhage of left eye ([place]) 08/01/2019    Past Surgical History:  Procedure Laterality Date   APPENDECTOMY     BREAST SURGERY Left    CARDIAC CATHETERIZATION N/A 03/15/2016   Procedure: Left Heart Cath and Coronary Angiography;  Surgeon: Belva Crome, MD;  Location: [facility];  Service: Cardiovascular;  Laterality: N/A;   CARDIAC CATHETERIZATION N/A 03/15/2016   Procedure: Coronary Stent Intervention;  Surgeon: Belva Crome, MD;  Location: [place] [facility];  Service: Cardiovascular;  Laterality: N/A;   COLONOSCOPY     remote   COLONOSCOPY N/A 05/26/2016   Procedure: COLONOSCOPY;  Surgeon: Daneil Dolin, MD;  [facility];  Service: Endoscopy;  Laterality: N/A;  845   ESOPHAGOGASTRODUODENOSCOPY N/A 05/26/2016   Procedure: ESOPHAGOGASTRODUODENOSCOPY (EGD);  Surgeon: Daneil Dolin, MD;  [facility];  Service: Endoscopy;  Laterality: N/A;   MALONEY DILATION N/A 05/26/2016   Procedure: Venia Minks DILATION;  Surgeon: Daneil Dolin, MD;  [facility];  Service: Endoscopy;  Laterality: N/A;   VITRECTOMY Left 10/03/2019   Dr. Zadie Rhine, Vitrectomy, Focal Laser, Removal of Silicone Oil    Current Outpatient Medications  Medication Sig Dispense Refill   acetaminophen (TYLENOL) 500 MG tablet Take 1,500 mg by mouth every 4 (four) hours as needed.     albuterol (PROVENTIL) (2.5 MG/3ML) 0.083% nebulizer solution Take 2.5 mg by nebulization every 6 (six) hours as needed for wheezing or shortness of breath.  albuterol (VENTOLIN HFA) 108 (90 Base) MCG/ACT inhaler Inhale 2 puffs into the lungs every 6 (six) hours as needed for wheezing or shortness of breath.     amLODipine-olmesartan (AZOR) 10-40 MG tablet Take 1 tablet by mouth every morning.     buPROPion (WELLBUTRIN XL) 300 MG 24 hr tablet Take 300 mg by mouth daily.     Cholecalciferol (VITAMIN D3) 50 MCG (2000 UT) TABS Take 2,000 Units by mouth daily.     denosumab (PROLIA) 60 MG/ML  SOSY injection Inject 60 mg into the skin every 6 (six) months.     ELIQUIS 5 MG TABS tablet TAKE ONE TABLET (5MG TOTAL) BY MOUTH TWOTIMES DAILY 60 tablet 6   ferrous sulfate 325 (65 FE) MG tablet Take 325 mg by mouth daily with breakfast.      fluvoxaMINE (LUVOX) 100 MG tablet Take 1 tablet (100 mg total) by mouth 2 (two) times daily. 20 tablet 0   levothyroxine (SYNTHROID) 50 MCG tablet Take 50 mcg by mouth daily before breakfast.      metoprolol succinate (TOPROL-XL) 25 MG 24 hr tablet Take 25 mg by mouth daily.     metoprolol succinate (TOPROL-XL) 50 MG 24 hr tablet Take 50 mg by mouth daily.     nitroGLYCERIN (NITROSTAT) 0.4 MG SL tablet Place 0.4 mg under the tongue every 5 (five) minutes as needed for chest pain.     rosuvastatin (CRESTOR) 20 MG tablet Take 20 mg by mouth daily.      sertraline (ZOLOFT) 25 MG tablet Take 25 mg by mouth daily.     TRELEGY ELLIPTA 200-62.5-25 MCG/INH AEPB Inhale 1 puff into the lungs daily.     No current facility-administered medications for this visit.   Allergies:  Sulfa antibiotics and Sulfa antibiotics   Social History: The patient  reports that she quit smoking about 5 years ago. She has never used smokeless tobacco. She reports that she does not drink alcohol and does not use drugs.   Family History: The patient's family history includes COPD in her father; Cancer in her brother, brother, and sister; Heart disease in her mother; Hypertension in her son.   ROS:  Please see the history of present illness. Otherwise, complete review of systems is positive for none.  All other systems are reviewed and negative.   Physical Exam: VS:  BP 110/60   Pulse 60   Ht 5' 8"  (1.727 m)   Wt 146 lb 9.6 oz (66.5 kg)   SpO2 93%   BMI 22.29 kg/m , BMI Body mass index is 22.29 kg/m.  Wt Readings from Last 3 Encounters:  12/16/20 146 lb 9.6 oz (66.5 kg)  10/27/20 149 lb 8 oz (67.8 kg)  10/02/20 149 lb 9.6 oz (67.9 kg)    General: Patient appears  comfortable at rest. Neck: Supple, no elevated JVP or carotid bruits, no thyromegaly. Lungs: Clear to auscultation, nonlabored breathing at rest. Cardiac: Regular rate and rhythm, no S3 or significant systolic murmur, no pericardial rub. Extremities: No pitting edema, distal pulses 2+. Skin: Warm and dry. Musculoskeletal: No kyphosis. Neuropsychiatric: Alert and oriented x3, affect grossly appropriate.  ECG: 12/16/2020 sinus bradycardia with a rate of 55.  Recent Labwork: 10/20/2020: Hemoglobin 12.1; Platelets 100     Component Value Date/Time   CHOL 159 03/13/2016 0324   TRIG 63 03/13/2016 0324   HDL 70 03/13/2016 0324   CHOLHDL 2.3 03/13/2016 0324   VLDL 13 03/13/2016 0324   LDLCALC 76 03/13/2016 0324  Other Studies Reviewed Today:   Echocardiogram 08/27/2017 Study Conclusions   - Left ventricle: The cavity size was normal. Wall thickness was at    the upper limits of normal. Systolic function was normal. The    estimated ejection fraction was in the range of 60% to 65%. Wall    motion was normal; there were no regional wall motion    abnormalities. Doppler parameters are consistent with abnormal    left ventricular relaxation (grade 1 diastolic dysfunction).  - Aortic valve: Mildly calcified annulus. Trileaflet.  - Mitral valve: Mildly calcified annulus. There was mild    regurgitation.  - Right atrium: Central venous pressure (est): 3 mm Hg.  - Atrial septum: No defect or patent foramen ovale was identified.  - Tricuspid valve: There was trivial regurgitation.  - Pulmonary arteries: PA peak pressure: 29 mm Hg (S).  - Pericardium, extracardiac: There was no pericardial effusion.        11/2013 Echo Study Conclusions - Left ventricle: The cavity size was normal. Wall thickness was at the upper limits of normal. Systolic function was vigorous. The estimated ejection fraction was in the range of 65% to 70%. Wall motion was normal; there were no regional wall  motion abnormalities. Doppler parameters are consistent with abnormal left ventricular relaxation (grade 1 diastolic dysfunction). Doppler parameters are consistent with elevated ventricular end-diastolic filling pressure. - Mitral valve: Calcified annulus. There was mild regurgitation. - Right atrium: Central venous pressure (est): 3 mm Hg. - Atrial septum: No defect or patent foramen ovale was identified. - Tricuspid valve: There was trivial regurgitation. - Pulmonary arteries: PA peak pressure: 28 mm Hg (S). - Pericardium, extracardiac: There was no pericardial effusion.  Impressions:  - Upper normal LV wall thickness with LVEF 46-50%, grade 1 diastolic dysfunction with increased filling pressures. Mild mitral regurgitation. Trivial tricuspid regurgitation with normal PASP 28 mmHg.   10/2015 Echo Study Conclusions   - Left ventricle: Systolic function was normal. The estimated   ejection fraction was in the range of 55% to 60%. Wall motion was   normal; there were no regional wall motion abnormalities. 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 - Mitral valve: Calcified annulus. There was mild to moderate   regurgitation directed centrally. Valve area by pressure   half-time: 2.34 cm^2. - Pulmonary arteries: Systolic pressure was mildly increased. PA   peak pressure: 41 mm Hg (S).   03/2016 cath The left ventricular ejection fraction is 55-65% by visual estimate. The left ventricular systolic function is normal. A STENT RESOLUTE ONYX 2.75X12 drug eluting stent was successfully placed, and does not overlap previously placed stent. Prox Cx to Mid Cx lesion, 99 %stenosed. Post intervention, there is a 0% residual stenosis.   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 Otherwise widely patent coronary arteries Successful angioplasty and stenting of the proximal circumflex reducing a greater than 95% stenosis to 0% with TIMI  grade 3 flow. Resolute Onyx 2.75 x 12 mm stent deployed at 14 atm.   Recommendations:   Aspirin, Brilinta, and Eliquis x 1 month then drop aspirin. See further instruction below for 3 months and beyond. Eliquis should start in AM as long as no recurrent AF/Afl. Would also consider switch to Plavix at 3 months to decrease the risk of bleeding on Eliquis.   Assessment and Plan:  1. CAD in native artery   2. Atrial flutter, unspecified type ([place])   3. Essential hypertension  1. CAD in native artery Denies any anginal or exertional symptoms.  Continue nitroglycerin sublingual.  Atorvastatin 20 mg daily, Toprol-XL 75 mg daily.  2. Atrial flutter, unspecified type ([place]) EKG today shows sinus bradycardia with a rate of 55.  Continue Eliquis 5 mg p.o. twice daily.  Continue Toprol-XL 75 mg daily  3. Essential hypertension Blood pressure well controlled today at 110/60.  Continue Azor 10/40 mg daily.  Continue Toprol-XL 75 mg daily.  Medication Adjustments/Labs and Tests Ordered: Current medicines are reviewed at length with the patient today.  Concerns regarding medicines are outlined above.   Disposition: Follow-up with Dr. Harl Bowie or APP 6 months  Signed, Levell July, NP 12/16/2020 9:47 AM    [facility] [place], [place], [place] [address] Phone: [phone]; Fax: [phone]

## 2020-12-16 ENCOUNTER — Ambulatory Visit (INDEPENDENT_AMBULATORY_CARE_PROVIDER_SITE_OTHER): Payer: Medicare Other | Admitting: Family Medicine

## 2020-12-16 ENCOUNTER — Encounter: Payer: Self-pay | Admitting: Family Medicine

## 2020-12-16 VITALS — BP 110/60 | HR 60 | Ht 68.0 in | Wt 146.6 lb

## 2020-12-16 DIAGNOSIS — I2510 Atherosclerotic heart disease of native coronary artery without angina pectoris: Secondary | ICD-10-CM | POA: Diagnosis not present

## 2020-12-16 DIAGNOSIS — I4892 Unspecified atrial flutter: Secondary | ICD-10-CM

## 2020-12-16 DIAGNOSIS — I10 Essential (primary) hypertension: Secondary | ICD-10-CM | POA: Diagnosis not present

## 2020-12-16 NOTE — Patient Instructions (Addendum)
Medication Instructions:  none  Labwork: none  Testing/Procedures: none  Follow-Up: Your physician recommends that you schedule a follow-up appointment in: 6 months in [place].  Any Other Special Instructions Will Be Listed Below (If Applicable).  If you need a refill on your cardiac medications before your next appointment, please call your pharmacy.

## 2020-12-18 ENCOUNTER — Other Ambulatory Visit (HOSPITAL_COMMUNITY): Payer: Self-pay | Admitting: Internal Medicine

## 2020-12-18 DIAGNOSIS — J449 Chronic obstructive pulmonary disease, unspecified: Secondary | ICD-10-CM | POA: Diagnosis not present

## 2020-12-18 DIAGNOSIS — I482 Chronic atrial fibrillation, unspecified: Secondary | ICD-10-CM | POA: Diagnosis not present

## 2020-12-18 DIAGNOSIS — I7300 Raynaud's syndrome without gangrene: Secondary | ICD-10-CM | POA: Diagnosis not present

## 2020-12-18 DIAGNOSIS — Z1231 Encounter for screening mammogram for malignant neoplasm of breast: Secondary | ICD-10-CM

## 2020-12-18 DIAGNOSIS — E782 Mixed hyperlipidemia: Secondary | ICD-10-CM | POA: Diagnosis not present

## 2020-12-18 DIAGNOSIS — E039 Hypothyroidism, unspecified: Secondary | ICD-10-CM | POA: Diagnosis not present

## 2020-12-18 DIAGNOSIS — E875 Hyperkalemia: Secondary | ICD-10-CM | POA: Diagnosis not present

## 2020-12-18 DIAGNOSIS — D696 Thrombocytopenia, unspecified: Secondary | ICD-10-CM | POA: Diagnosis not present

## 2020-12-18 DIAGNOSIS — R7301 Impaired fasting glucose: Secondary | ICD-10-CM | POA: Diagnosis not present

## 2020-12-18 DIAGNOSIS — N1832 Chronic kidney disease, stage 3b: Secondary | ICD-10-CM | POA: Diagnosis not present

## 2020-12-18 DIAGNOSIS — Z0001 Encounter for general adult medical examination with abnormal findings: Secondary | ICD-10-CM | POA: Diagnosis not present

## 2020-12-18 DIAGNOSIS — M810 Age-related osteoporosis without current pathological fracture: Secondary | ICD-10-CM

## 2020-12-19 ENCOUNTER — Ambulatory Visit (HOSPITAL_COMMUNITY): Payer: Medicare Other

## 2020-12-23 ENCOUNTER — Encounter (HOSPITAL_COMMUNITY)
Admission: RE | Admit: 2020-12-23 | Discharge: 2020-12-23 | Disposition: A | Discharge status: Home / Self Care | Payer: Medicare Other | Source: Ambulatory Visit | Attending: Internal Medicine | Admitting: Internal Medicine

## 2020-12-23 ENCOUNTER — Encounter (HOSPITAL_COMMUNITY): Payer: Self-pay

## 2020-12-23 ENCOUNTER — Other Ambulatory Visit: Payer: Self-pay

## 2020-12-23 DIAGNOSIS — M810 Age-related osteoporosis without current pathological fracture: Secondary | ICD-10-CM | POA: Insufficient documentation

## 2020-12-23 MED ORDER — DENOSUMAB 60 MG/ML ~~LOC~~ SOSY
60.0000 mg | PREFILLED_SYRINGE | Freq: Once | SUBCUTANEOUS | Status: AC
  Administered 2020-12-23: 60 mg via SUBCUTANEOUS

## 2021-01-02 DIAGNOSIS — K219 Gastro-esophageal reflux disease without esophagitis: Secondary | ICD-10-CM | POA: Diagnosis not present

## 2021-01-12 ENCOUNTER — Encounter (INDEPENDENT_AMBULATORY_CARE_PROVIDER_SITE_OTHER): Payer: Medicare Other | Admitting: Ophthalmology

## 2021-01-21 ENCOUNTER — Encounter (INDEPENDENT_AMBULATORY_CARE_PROVIDER_SITE_OTHER): Payer: Medicare Other | Admitting: Ophthalmology

## 2021-01-21 ENCOUNTER — Emergency Department (HOSPITAL_COMMUNITY)
Admission: EM | Admit: 2021-01-21 | Discharge: 2021-01-21 | Disposition: A | Discharge status: Home / Self Care | Payer: Medicare Other | Attending: Emergency Medicine | Admitting: Emergency Medicine

## 2021-01-21 ENCOUNTER — Emergency Department (HOSPITAL_COMMUNITY): Payer: Medicare Other

## 2021-01-21 ENCOUNTER — Other Ambulatory Visit: Payer: Self-pay

## 2021-01-21 DIAGNOSIS — Z79899 Other long term (current) drug therapy: Secondary | ICD-10-CM | POA: Diagnosis not present

## 2021-01-21 DIAGNOSIS — Z20822 Contact with and (suspected) exposure to covid-19: Secondary | ICD-10-CM | POA: Insufficient documentation

## 2021-01-21 DIAGNOSIS — J439 Emphysema, unspecified: Secondary | ICD-10-CM | POA: Diagnosis not present

## 2021-01-21 DIAGNOSIS — I2510 Atherosclerotic heart disease of native coronary artery without angina pectoris: Secondary | ICD-10-CM | POA: Diagnosis not present

## 2021-01-21 DIAGNOSIS — Z87891 Personal history of nicotine dependence: Secondary | ICD-10-CM | POA: Insufficient documentation

## 2021-01-21 DIAGNOSIS — N184 Chronic kidney disease, stage 4 (severe): Secondary | ICD-10-CM | POA: Insufficient documentation

## 2021-01-21 DIAGNOSIS — R059 Cough, unspecified: Secondary | ICD-10-CM | POA: Diagnosis not present

## 2021-01-21 DIAGNOSIS — I129 Hypertensive chronic kidney disease with stage 1 through stage 4 chronic kidney disease, or unspecified chronic kidney disease: Secondary | ICD-10-CM | POA: Diagnosis not present

## 2021-01-21 DIAGNOSIS — I4891 Unspecified atrial fibrillation: Secondary | ICD-10-CM | POA: Insufficient documentation

## 2021-01-21 DIAGNOSIS — Z7951 Long term (current) use of inhaled steroids: Secondary | ICD-10-CM | POA: Diagnosis not present

## 2021-01-21 DIAGNOSIS — J069 Acute upper respiratory infection, unspecified: Secondary | ICD-10-CM | POA: Diagnosis not present

## 2021-01-21 DIAGNOSIS — Z853 Personal history of malignant neoplasm of breast: Secondary | ICD-10-CM | POA: Diagnosis not present

## 2021-01-21 DIAGNOSIS — Z7901 Long term (current) use of anticoagulants: Secondary | ICD-10-CM | POA: Insufficient documentation

## 2021-01-21 DIAGNOSIS — J449 Chronic obstructive pulmonary disease, unspecified: Secondary | ICD-10-CM | POA: Diagnosis not present

## 2021-01-21 LAB — RESP PANEL BY RT-PCR (FLU A&B, COVID) ARPGX2
Influenza A by PCR: NEGATIVE
Influenza B by PCR: NEGATIVE
SARS Coronavirus 2 by RT PCR: NEGATIVE

## 2021-01-21 MED ORDER — DOXYCYCLINE HYCLATE 100 MG PO CAPS: 100.0000 mg | ORAL_CAPSULE | Freq: Two times a day (BID) | ORAL | 0 refills | Status: AC

## 2021-01-21 MED ORDER — BENZONATATE 100 MG PO CAPS
200.0000 mg | ORAL_CAPSULE | Freq: Once | ORAL | Status: AC
  Administered 2021-01-21: 200 mg via ORAL
  Filled 2021-01-21: qty 2

## 2021-01-21 MED ORDER — DOXYCYCLINE HYCLATE 100 MG PO TABS
100.0000 mg | ORAL_TABLET | Freq: Once | ORAL | Status: AC
  Administered 2021-01-21: 100 mg via ORAL
  Filled 2021-01-21: qty 1

## 2021-01-21 MED ORDER — BENZONATATE 100 MG PO CAPS: 100.0000 mg | ORAL_CAPSULE | Freq: Three times a day (TID) | ORAL | 0 refills | Status: DC

## 2021-01-21 MED ORDER — PREDNISONE 20 MG PO TABS: 40.0000 mg | ORAL_TABLET | Freq: Every day | ORAL | 0 refills | Status: DC

## 2021-01-21 MED ORDER — PREDNISONE 20 MG PO TABS
40.0000 mg | ORAL_TABLET | Freq: Once | ORAL | Status: AC
  Administered 2021-01-21: 40 mg via ORAL
  Filled 2021-01-21: qty 2

## 2021-01-21 NOTE — ED Provider Notes (Signed)
[facility] EMERGENCY DEPARTMENT Provider Note   CSN: [id number] Arrival date & time: 01/21/21  1956     History Chief Complaint  Patient presents with   Nasal Congestion    Patricia Jackson is a 80 y.o. female.  HPI  This patient is a 80 year old female, she has a history of breast cancer which is remote, she has a history of atrial fibrillation and flutter on Eliquis, she has a history of COPD but stopped smoking approximately 4 years ago.  She reports that she recently approximately 2 weeks ago developed a cough, this cough is associated with some runny nose and congestion though she states she is always congested on the left side of her nose.  No fevers or chills no nausea vomiting or diarrhea, no swelling of the legs, no rashes on the skin, no sick contacts, not recently tested for COVID.  The patient has not been using any specific cough medications but has been using her inhalers, Trelegy and rescue albuterol which seems to help temporarily.  She is coughing, brings up lots of phlegm.  Has occasional chest pain when she does cough and take a deep breath.  It is not constant and overall she does not feel poorly.  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   Traction detachment of left retina 08/03/2019   Left retinal detachment 08/01/2019   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[place]) 08/01/2019   Retinal hemorrhage of left eye  08/01/2019   Advanced nonexudative age-related macular degeneration of left eye with subfoveal involvement 08/01/2019   Total retinal detachment of left eye 07/26/2019   Choroidal detachment of right eye 07/26/2019   Exudative retinopathy of left eye 07/26/2019   Thrombocytopenia ([place]) 07/04/2019   Orthostatic syncope 08/27/2017   Atypical chest pain 08/27/2017   Atrial flutter ([place]) 10/28/2016   CKD (chronic kidney disease), stage IV ([place]) 10/28/2016   COPD (chronic obstructive pulmonary disease) ([place]) 10/28/2016   Depression 10/28/2016   Esophageal dysphagia    Anemia 04/07/2016   Sinus bradycardia    CAD in native artery 03/16/2016   S/P angioplasty with stent 03/15/16 DES Resolute, [place] 03/16/2016   NSTEMI (non-ST elevated myocardial infarction) ([place]) 03/12/2016   Shortness of breath    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[place]) 10/31/2015   Dyslipidemia 10/31/2015   Atrial flutter with rapid ventricular response ([place]) 10/30/2015   Back pain 11/26/2013   Arm pain 11/26/2013   Chest pain 11/26/2013   Hypertension     Past Surgical History:  Procedure Laterality Date   APPENDECTOMY     BREAST SURGERY Left    CARDIAC CATHETERIZATION N/A 03/15/2016   Procedure: Left Heart Cath and Coronary Angiography;  Surgeon: Belva Crome, MD;  Location: [facility] [facility];  Service: Cardiovascular;  Laterality: N/A;   CARDIAC CATHETERIZATION N/A 03/15/2016   Procedure: Coronary Stent Intervention;  Surgeon: Belva Crome, MD;  Location: [facility];  Service: Cardiovascular;  Laterality: N/A;   COLONOSCOPY     remote   COLONOSCOPY N/A 05/26/2016   Procedure: COLONOSCOPY;  Surgeon: Daneil Dolin, MD;  [facility];  Service: Endoscopy;  Laterality: N/A;  845   ESOPHAGOGASTRODUODENOSCOPY N/A 05/26/2016   Procedure: ESOPHAGOGASTRODUODENOSCOPY (EGD);  Surgeon: Daneil Dolin, MD;  [facility];  Service: Endoscopy;  Laterality: N/A;   MALONEY DILATION N/A 05/26/2016   Procedure:  Venia Minks DILATION;  Surgeon: Daneil Dolin, MD;  [facility];  Service: Endoscopy;  Laterality: N/A;   VITRECTOMY Left 10/03/2019   Dr. Zadie Rhine, Vitrectomy, Focal Laser, Removal of Silicone Oil     OB History   No obstetric history on file.     Family History  Problem Relation Age of Onset   Heart disease Mother    COPD Father    Cancer Sister        unknown primary   Cancer Brother        unknown primary   Cancer Brother    Hypertension Son    Colon cancer Neg Hx     Social History   Tobacco Use   Smoking status: Former    Types: Cigarettes    Quit date: 12/28/2014    Years since quitting: 6.0   Smokeless tobacco: Never   Tobacco comments:    Patient quit within the last 10 years  Vaping Use   Vaping Use: Never used  Substance Use Topics   Alcohol use: Never   Drug use: Never    Home Medications Prior to Admission medications   Medication Sig Start Date End Date Taking? Authorizing Provider  benzonatate (TESSALON) 100 MG capsule Take 1 capsule (100 mg total) by mouth every 8 (eight) hours. 01/21/21  Yes Noemi Chapel, MD  doxycycline (VIBRAMYCIN) 100 MG capsule Take 1 capsule (100 mg total) by mouth 2 (two) times daily for 7 days. 01/21/21 01/28/21 Yes Noemi Chapel, MD  predniSONE (DELTASONE) 20 MG tablet Take 2 tablets (40 mg total) by mouth daily. 01/21/21  Yes Noemi Chapel, MD  acetaminophen (TYLENOL) 500 MG tablet Take 1,500 mg by mouth every 4 (four) hours as needed.    [provider]  albuterol (PROVENTIL) (2.5 MG/3ML) 0.083% nebulizer solution Take 2.5 mg by nebulization every 6 (six) hours as needed for wheezing or shortness of breath.    [provider]  albuterol (VENTOLIN HFA) 108 (90 Base) MCG/ACT inhaler Inhale 2 puffs into the lungs every 6 (six) hours as needed for wheezing or shortness of breath.    [provider]  amLODipine-olmesartan (AZOR) 10-40 MG tablet Take 1 tablet by mouth every morning. 10/03/19    [provider]  buPROPion (WELLBUTRIN XL) 300 MG 24 hr tablet Take 300 mg by mouth daily. 11/05/19   [provider]  Cholecalciferol (VITAMIN D3) 50 MCG (2000 UT) TABS Take 2,000 Units by mouth daily.    [provider]  denosumab (PROLIA) 60 MG/ML SOSY injection Inject 60 mg into the skin every 6 (six) months.    [provider]  ELIQUIS 5 MG TABS tablet TAKE ONE TABLET (5MG TOTAL) BY MOUTH TWOTIMES DAILY 11/10/20   Arnoldo Lenis, MD  ferrous sulfate 325 (65 FE) MG tablet Take 325 mg by mouth daily with breakfast.  04/08/16   [provider]  fluvoxaMINE (LUVOX) 100 MG tablet Take 1 tablet (100 mg total) by mouth 2 (two) times daily. 1/66/06   Roxanne Mins,  Shanon Brow, MD  levothyroxine (SYNTHROID) 50 MCG tablet Take 50 mcg by mouth daily before breakfast.  09/20/19   [provider]  metoprolol succinate (TOPROL-XL) 25 MG 24 hr tablet Take 25 mg by mouth daily. 10/11/20   [provider]  metoprolol succinate (TOPROL-XL) 50 MG 24 hr tablet Take 50 mg by mouth daily. 10/15/19   [provider]  nitroGLYCERIN (NITROSTAT) 0.4 MG SL tablet Place 0.4 mg under the tongue every 5 (five) minutes as needed for chest pain. 06/09/19   [provider]  rosuvastatin (CRESTOR) 20 MG tablet Take 20 mg by mouth daily.  02/26/17   [provider]  sertraline (ZOLOFT) 25 MG tablet Take 25 mg by mouth daily.    [provider]  Donnal Debar 200-62.5-25 MCG/INH AEPB Inhale 1 puff into the lungs daily. 09/29/20   [provider]    Allergies    Sulfa antibiotics and Sulfa antibiotics  Review of Systems   Review of Systems  All other systems reviewed and are negative.  Physical Exam Updated Vital Signs BP 116/73 (BP Location: Right Arm)   Pulse 100   Temp 97.6 F (36.4 C) (Oral)   Resp 18   Ht 1.702 m (5' 7" )   Wt 63.5 kg   SpO2 100%   BMI 21.93 kg/m   Physical Exam Vitals and nursing note reviewed.   Constitutional:      General: She is not in acute distress.    Appearance: She is well-developed.  HENT:     Head: Normocephalic and atraumatic.     Nose:     Comments: Clear rhinorrhea bilaterally, clear oropharynx.  The patient has a deviated septum with a very narrow left-sided nare and a wide open right-sided nare    Mouth/Throat:     Pharynx: No oropharyngeal exudate.  Eyes:     General: No scleral icterus.       Right eye: No discharge.        Left eye: No discharge.     Conjunctiva/sclera: Conjunctivae normal.     Pupils: Pupils are equal, round, and reactive to light.  Neck:     Thyroid: No thyromegaly.     Vascular: No JVD.  Cardiovascular:     Rate and Rhythm: Normal rate and regular rhythm.     Heart sounds: Normal heart sounds. No murmur heard.   No friction rub. No gallop.  Pulmonary:     Effort: Pulmonary effort is normal. No respiratory distress.     Breath sounds: No stridor. Rhonchi present. No wheezing or rales.  Abdominal:     General: Bowel sounds are normal. There is no distension.     Palpations: Abdomen is soft. There is no mass.     Tenderness: There is no abdominal tenderness.  Musculoskeletal:        General: No tenderness. Normal range of motion.     Cervical back: Normal range of motion and neck supple.     Right lower leg: No edema.     Left lower leg: No edema.  Lymphadenopathy:     Cervical: No cervical adenopathy.  Skin:    General: Skin is warm and dry.     Findings: No erythema or rash.  Neurological:     General: No focal deficit present.     Mental Status: She is alert.     Coordination: Coordination normal.  Psychiatric:        Behavior: Behavior normal.    ED Results /  Procedures / Treatments   Labs (all labs ordered are listed, but only abnormal results are displayed) Labs Reviewed  RESP PANEL BY RT-PCR (FLU A&B, COVID) [id number]    EKG None  Radiology DG Chest 2 View  Result Date: 01/21/2021 CLINICAL DATA:  Cough.  EXAM: CHEST - 2 VIEW COMPARISON:  Chest x-ray 04/28/2020. FINDINGS: The lungs are hyperinflated, unchanged. There is linear scarring or atelectasis in the lung bases. There is no focal lung consolidation, pleural effusion or pneumothorax. The cardiomediastinal silhouette is within normal limits. Surgical clips overlie the left axilla. No acute fractures are seen. IMPRESSION: 1. No acute cardiopulmonary process. 2. Stable emphysema. Electronically Signed   By: Ronney Asters M.D.   On: 01/21/2021 21:28    Procedures Procedures   Medications Ordered in ED Medications  benzonatate (TESSALON) capsule 200 mg (200 mg Oral Given 01/21/21 2133)  predniSONE (DELTASONE) tablet 40 mg (40 mg Oral Given 01/21/21 2133)  doxycycline (VIBRA-TABS) tablet 100 mg (100 mg Oral Given 01/21/21 2133)    ED Course  I have reviewed the triage vital signs and the nursing notes.  Pertinent labs & imaging results that were available during my care of the patient were reviewed by me and considered in my medical decision making (see chart for details).    MDM Rules/Calculators/A&P                           Oxygen of 100% No tachycardia on my exam Blood pressure is normal Temperature is normal  Will obtain a chest x-ray and a COVID test.  Treat with prednisone and ongoing bronchodilators due to underlying COPD.  Benzonatate and doxycycline as well, 2 weeks of ongoing coughing which does not seem to be getting better.  She does not appear to be toxic and likely does not need to be admitted to the hospital  Chest x-ray is negative, COVID test is negative, this patient appears very stable for discharge on both medications as below  Final Clinical Impression(s) / ED Diagnoses Final diagnoses:  Upper respiratory tract infection, unspecified type    Rx / DC Orders ED Discharge Orders          Ordered    predniSONE (DELTASONE) 20 MG tablet  Daily        01/21/21 2234    benzonatate (TESSALON) 100 MG capsule   Every 8 hours        01/21/21 2234    doxycycline (VIBRAMYCIN) 100 MG capsule  2 times daily        01/21/21 2234             Noemi Chapel, MD 01/21/21 2236

## 2021-01-21 NOTE — Discharge Instructions (Signed)
Please take 2 puffs of albuterol every 4 hours for the first 24 hours, then every 4 hours as needed.  This medicine will help to open up your lungs and allow you breathe easier, cough less and have less tightness in your chest.  This medicine can be taken with you - it is small and portable.    Please take Tessalon 167m by mouth 3 times daily for cough as needed.  Prednisone is a steroid that helps to reduce certain types of inflammation and may be used for allergic reactions, some rashes such as poison ivy or dermatitis, for asthma attacks or bronchitis and for certain types of pain.  Please take this medicine exactly as prescribed - 413mby mouth daily for 5 days.  This can have certain side effects with some people including feeling like you can't sleep, feeling anxious or feeling like you are on a "high".  It should not cause weight gain if only taken for a short time.     Thank you for letting [place] care of you today!  Please obtain all of your results from medical records or have your doctors office obtain the results - share them with your doctor - you should be seen at your doctors office in the next 2 days. Call today to arrange your follow up. Take the medications as prescribed. Please review all of the medicines and only take them if you do not have an allergy to them. Please be aware that if you are taking birth control pills, taking other prescriptions, ESPECIALLY ANTIBIOTICS may make the birth control ineffective - if this is the case, either do not engage in sexual activity or use alternative methods of birth control such as condoms until you have finished the medicine and your family doctor says it is OK to restart them. If you are on a blood thinner such as COUMADIN, be aware that any other medicine that you take may cause the coumadin to either work too much, or not enough - you should have your coumadin level rechecked in next 7 days if this is the case.  ?  It is also a possibility  that you have an allergic reaction to any of the medicines that you have been prescribed - Everybody reacts differently to medications and while MOST people have no trouble with most medicines, you may have a reaction such as nausea, vomiting, rash, swelling, shortness of breath. If this is the case, please stop taking the medicine immediately and contact your physician.   If you were given a medication in the ED such as percocet, vicodin, or morphine, be aware that these medicines are sedating and may change your ability to take care of yourself adequately for several hours after being given this medicines - you should not drive or take care of small children if you were given this medicine in the Emergency Department or if you have been prescribed these types of medicines. ?   You should return to the ER IMMEDIATELY if you develop severe or worsening symptoms. .Marland Kitchen

## 2021-01-21 NOTE — ED Triage Notes (Signed)
~  2 weeks, congestion and runny nose.

## 2021-02-02 DIAGNOSIS — E785 Hyperlipidemia, unspecified: Secondary | ICD-10-CM | POA: Diagnosis not present

## 2021-02-02 DIAGNOSIS — I10 Essential (primary) hypertension: Secondary | ICD-10-CM | POA: Diagnosis not present

## 2021-02-09 ENCOUNTER — Ambulatory Visit (HOSPITAL_COMMUNITY)
Admission: RE | Admit: 2021-02-09 | Discharge: 2021-02-09 | Disposition: A | Discharge status: Home / Self Care | Payer: Medicare Other | Source: Ambulatory Visit | Attending: Acute Care | Admitting: Acute Care

## 2021-02-09 ENCOUNTER — Other Ambulatory Visit: Payer: Self-pay

## 2021-02-09 DIAGNOSIS — Z87891 Personal history of nicotine dependence: Secondary | ICD-10-CM | POA: Diagnosis not present

## 2021-02-11 ENCOUNTER — Other Ambulatory Visit: Payer: Self-pay | Admitting: Acute Care

## 2021-02-11 DIAGNOSIS — Z87891 Personal history of nicotine dependence: Secondary | ICD-10-CM

## 2021-02-13 DIAGNOSIS — H905 Unspecified sensorineural hearing loss: Secondary | ICD-10-CM | POA: Diagnosis not present

## 2021-02-17 ENCOUNTER — Other Ambulatory Visit: Payer: Self-pay

## 2021-02-17 ENCOUNTER — Encounter (INDEPENDENT_AMBULATORY_CARE_PROVIDER_SITE_OTHER): Payer: Self-pay | Admitting: Ophthalmology

## 2021-02-17 ENCOUNTER — Ambulatory Visit (INDEPENDENT_AMBULATORY_CARE_PROVIDER_SITE_OTHER): Payer: Medicare Other | Admitting: Ophthalmology

## 2021-02-17 ENCOUNTER — Encounter (INDEPENDENT_AMBULATORY_CARE_PROVIDER_SITE_OTHER): Payer: Medicare Other | Admitting: Ophthalmology

## 2021-02-17 DIAGNOSIS — H35371 Puckering of macula, right eye: Secondary | ICD-10-CM

## 2021-02-17 DIAGNOSIS — H35021 Exudative retinopathy, right eye: Secondary | ICD-10-CM | POA: Diagnosis not present

## 2021-02-17 DIAGNOSIS — H35022 Exudative retinopathy, left eye: Secondary | ICD-10-CM

## 2021-02-17 DIAGNOSIS — H353211 Exudative age-related macular degeneration, right eye, with active choroidal neovascularization: Secondary | ICD-10-CM | POA: Diagnosis not present

## 2021-02-17 MED ORDER — BEVACIZUMAB 2.5 MG/0.1ML IZ SOSY
2.5000 mg | PREFILLED_SYRINGE | INTRAVITREAL | Status: AC | PRN
  Administered 2021-02-17: 2.5 mg via INTRAVITREAL

## 2021-02-17 NOTE — Assessment & Plan Note (Signed)
Condition remained stable with no recurrence

## 2021-02-17 NOTE — Assessment & Plan Note (Signed)
Ongoing massive PECHR, yet with new subretinal hemorrhage and sub-RPE hemorrhage component resolving post initial PRP and post ongoing intravitreal Avastin.  Massive improvement as compared repeat occurrence August 2022, yet the exudative component is still present with some minor extension into the fovea will we will monitor this closely, follow-up in 2 weeks after injection Avastin today

## 2021-02-17 NOTE — Assessment & Plan Note (Signed)
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

## 2021-02-17 NOTE — Progress Notes (Addendum)
02/17/2021     CHIEF COMPLAINT Patient presents for  Chief Complaint  Patient presents with   Retina Follow Up      HISTORY OF PRESENT ILLNESS: Patricia Jackson is a 80 y.o. female who presents to the clinic today for:   HPI     Retina Follow Up   Patient presents with  Other.  In right eye.  This started 10 weeks ago.  Duration of 10 weeks.  Since onset it is stable.        Comments   10 week f/u OD with OCT and FP, possible Avastin injection OD  Pt c/o vision fluctuating, states her vision seems a bit more limited sometimes.      Last edited by Reather Littler, COA on 02/17/2021  8:59 AM.      Referring physician: Celene Squibb, MD [place],  [place] [address]  HISTORICAL INFORMATION:   Selected notes from the MEDICAL RECORD NUMBER    Lab Results  Component Value Date   HGBA1C 5.6 10/30/2015     CURRENT MEDICATIONS: No current outpatient medications on file. (Ophthalmic Drugs)   No current facility-administered medications for this visit. (Ophthalmic Drugs)   Current Outpatient Medications (Other)  Medication Sig   acetaminophen (TYLENOL) 500 MG tablet Take 1,500 mg by mouth every 4 (four) hours as needed.   albuterol (PROVENTIL) (2.5 MG/3ML) 0.083% nebulizer solution Take 2.5 mg by nebulization every 6 (six) hours as needed for wheezing or shortness of breath.   albuterol (VENTOLIN HFA) 108 (90 Base) MCG/ACT inhaler Inhale 2 puffs into the lungs every 6 (six) hours as needed for wheezing or shortness of breath.   amLODipine-olmesartan (AZOR) 10-40 MG tablet Take 1 tablet by mouth every morning.   benzonatate (TESSALON) 100 MG capsule Take 1 capsule (100 mg total) by mouth every 8 (eight) hours.   buPROPion (WELLBUTRIN XL) 300 MG 24 hr tablet Take 300 mg by mouth daily.   Cholecalciferol (VITAMIN D3) 50 MCG (2000 UT) TABS Take 2,000 Units by mouth daily.   denosumab (PROLIA) 60 MG/ML SOSY injection Inject 60 mg into the skin every 6  (six) months.   ELIQUIS 5 MG TABS tablet TAKE ONE TABLET (5MG TOTAL) BY MOUTH TWOTIMES DAILY   ferrous sulfate 325 (65 FE) MG tablet Take 325 mg by mouth daily with breakfast.    fluvoxaMINE (LUVOX) 100 MG tablet Take 1 tablet (100 mg total) by mouth 2 (two) times daily.   levothyroxine (SYNTHROID) 50 MCG tablet Take 50 mcg by mouth daily before breakfast.    metoprolol succinate (TOPROL-XL) 25 MG 24 hr tablet Take 25 mg by mouth daily.   metoprolol succinate (TOPROL-XL) 50 MG 24 hr tablet Take 50 mg by mouth daily.   nitroGLYCERIN (NITROSTAT) 0.4 MG SL tablet Place 0.4 mg under the tongue every 5 (five) minutes as needed for chest pain.   predniSONE (DELTASONE) 20 MG tablet Take 2 tablets (40 mg total) by mouth daily.   rosuvastatin (CRESTOR) 20 MG tablet Take 20 mg by mouth daily.    sertraline (ZOLOFT) 25 MG tablet Take 25 mg by mouth daily.   TRELEGY ELLIPTA 200-62.5-25 MCG/INH AEPB Inhale 1 puff into the lungs daily.   No current facility-administered medications for this visit. (Other)      REVIEW OF SYSTEMS:    ALLERGIES Allergies  Allergen Reactions   Sulfa Antibiotics Hives   Sulfa Antibiotics Hives and Other (See Comments)    Reaction not  recalled by the patient, but "hives" were noted in her other profile in [place] Past Medical History:  Diagnosis Date   A-fib [facility])    Atrial flutter ([place])    On Eliquis in 8/17 but discontinued after stent placement   Breast cancer ([place])    remote   CAD in native artery 03/16/2016   COPD (chronic obstructive pulmonary disease) ([place])    Hypertension    S/P angioplasty with stent 03/15/16 DES Resolute, pLCX 03/16/2016   Vitreous hemorrhage of left eye ([place]) 08/01/2019   Past Surgical History:  Procedure Laterality Date   APPENDECTOMY     BREAST SURGERY Left    CARDIAC CATHETERIZATION N/A 03/15/2016   Procedure: Left Heart Cath and Coronary Angiography;  Surgeon: Belva Crome, MD;  Location: [place]  CV LAB;  Service: Cardiovascular;  Laterality: N/A;   CARDIAC CATHETERIZATION N/A 03/15/2016   Procedure: Coronary Stent Intervention;  Surgeon: Belva Crome, MD;  Location: [place] CV LAB;  Service: Cardiovascular;  Laterality: N/A;   COLONOSCOPY     remote   COLONOSCOPY N/A 05/26/2016   Procedure: COLONOSCOPY;  Surgeon: Daneil Dolin, MD;  [facility];  Service: Endoscopy;  Laterality: N/A;  845   ESOPHAGOGASTRODUODENOSCOPY N/A 05/26/2016   Procedure: ESOPHAGOGASTRODUODENOSCOPY (EGD);  Surgeon: Daneil Dolin, MD;  [facility];  Service: Endoscopy;  Laterality: N/A;   MALONEY DILATION N/A 05/26/2016   Procedure: Venia Minks DILATION;  Surgeon: Daneil Dolin, MD;  [facility];  Service: Endoscopy;  Laterality: N/A;   VITRECTOMY Left 10/03/2019   Dr. Zadie Rhine, Vitrectomy, Focal Laser, Removal of Silicone Oil    FAMILY HISTORY Family History  Problem Relation Age of Onset   Heart disease Mother    COPD Father    Cancer Sister        unknown primary   Cancer Brother        unknown primary   Cancer Brother    Hypertension Son    Colon cancer Neg Hx     SOCIAL HISTORY Social History   Tobacco Use   Smoking status: Former    Types: Cigarettes    Quit date: 12/28/2014    Years since quitting: 6.1   Smokeless tobacco: Never   Tobacco comments:    Patient quit within the last 10 years  Vaping Use   Vaping Use: Never used  Substance Use Topics   Alcohol use: Never   Drug use: Never         OPHTHALMIC EXAM:  Base Eye Exam     Visual Acuity (ETDRS)       Right Left   Dist cc 20/50 +1 20/50 -2   Dist ph cc NI NI    Correction: Glasses         Tonometry (Tonopen, 9:08 AM)       Right Left   Pressure 10 11         Pupils       Pupils Dark Light Shape React APD   Right PERRL 6 5 Round Brisk None   Left PERRL 6 5 Round Brisk None         Visual Fields (Counting fingers)       Left Right   Restrictions Partial outer  superior temporal, superior nasal, inferior nasal deficiencies Total superior nasal, inferior nasal deficiencies; Partial outer superior temporal, inferior temporal deficiencies         Extraocular Movement  Right Left    Full, Ortho Full, Ortho         Neuro/Psych     Oriented x3: Yes   Mood/Affect: Normal         Dilation     Right eye: 1.0% Mydriacyl, 2.5% Phenylephrine @ 9:08 AM           Slit Lamp and Fundus Exam     External Exam       Right Left   External Normal Normal         Slit Lamp Exam       Right Left   Lids/Lashes Normal Normal   Conjunctiva/Sclera White and quiet White and quiet   Cornea Clear Clear   Anterior Chamber Deep and quiet Deep and quiet   Iris Round and reactive Round and reactive   Lens Posterior chamber intraocular lens Posterior chamber intraocular lens   Anterior Vitreous Normal Normal         Fundus Exam       Right Left   Posterior Vitreous Normal, clear no breakthrough hemorrhage Clear, vitrectomized, no oil   Disc Normal Normal   C/D Ratio 0.4 0.25   Macula Hemorrhagic sub-RPE and subretinal blood, less extensive into the posterior pole Normal   Vessels Normal Normal   Periphery  peripheral panretinal photocoagulation, with less active overlying peripheral hemorrhagic choroidopathy [facility]) in regions of previous peripheral PRP to the choroid and attempt to prevent this type of hemorrhagic disease.  Large subretinal hemorrhage extending from the 8:30 position anteriorly to the 12 o'clock position superiorly and posteriorly into the macula much of it subretinal some of it sub-RPE some resolution of the multifocal hemorrhagic choroidopathy, overall vastly improved clinical findings as compared to August 2022, as sub-RPE and subretinal hemorrhage in the macula has receded Peripheral chorioretinal scars of laser retinopexy as well as previous sites of hemorrhagic choroidopathy,, no hemorrhage, good laser  photocoagulation, retina attached.,  Clear media            IMAGING AND PROCEDURES  Imaging and Procedures for 02/17/21  OCT, Retina - OU - Both Eyes       Right Eye Quality was good. Scan locations included subfoveal. Central Foveal Thickness: 427. Progression has worsened. Findings include abnormal foveal contour, subretinal hyper-reflective material, epiretinal membrane.   Left Eye Quality was good. Scan locations included subfoveal. Central Foveal Thickness: 266. Findings include abnormal foveal contour, retinal drusen , no SRF, no IRF.   Notes Much less subfoveal thickening OD 1 month after Avastin No. 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  And observe  OS near normal no signs of complications     Color Fundus Photography Optos - OU - Both Eyes       Right Eye Progression has improved. Disc findings include normal observations. Macula : normal observations. Vessels : normal observations. Periphery : hemorrhage.   Left Eye Progression has been stable. Disc findings include normal observations. Macula : normal observations. Vessels : normal observations.   Notes Subretinal and choroidal hemorrhage of PECHR, temporally, recurrent and massive with sub-RPE and subretinal blood from the 8:00 to the 12 o'clock position anteriorly as well as extending posteriorly into the macula vastly improved and  in fact much less sub-RPE and subretinal heme as compared to August 2022 receding from the FAZ OD   OS clear media and attached after repair of retinal detachment from [facility] some years previous     Intravitreal  Injection, Pharmacologic Agent - OD - Right Eye       Time Out 02/17/2021. 10:11 AM. Confirmed correct patient, procedure, site, and patient consented.   Anesthesia Topical anesthesia was used. Anesthetic medications included Lidocaine 4%.   Procedure Preparation included 10% betadine to eyelids, 5% betadine to ocular surface, Tobramycin 0.3%. A 30 gauge needle was used.   Injection: 2.5 mg bevacizumab 2.5 MG/0.1ML   Route: Intravitreal, Site: Right Eye   NDC: [phone], Lot: [id number]   Post-op Post injection exam found visual acuity of at least counting fingers. The patient tolerated the procedure well. There were no complications. The patient received written and verbal post procedure care education. Post injection medications included ocuflox.              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  Massive improvement as compared repeat occurrence August 2022, yet the exudative component is still present with some minor extension into the fovea will we will monitor this closely, follow-up in 2 weeks after injection Avastin today  Right epiretinal membrane OD will continue to monitor as it could contribute to tractional change in the fovea yet subretinal exudate remains      ICD-10-CM   1. Exudative retinopathy of right eye  H35.021 OCT, Retina - OU - Both Eyes    Color Fundus Photography Optos - OU - Both Eyes    Intravitreal Injection, Pharmacologic Agent - OD - Right Eye    bevacizumab (AVASTIN) SOSY 2.5 mg    2. Exudative retinopathy of left  eye  H35.022     3. 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[place])  H35.3211     4. Right epiretinal membrane  H35.371       1.  PECHR OD, overall improved from onset of this massive involvement of the periphery of the right eye August 2022, yet still active much less subretinal hemorrhage however.  Residual exudate remains.  This may take some time for the normal choroid to clear 2.  Repeat injection Avastin OD today and monitor  3.  [place] up in 2 weeks to confirm no further worsening of hemorrhagic choroidopathy, PECHR  Ophthalmic Meds Ordered this visit:  Meds ordered this encounter  Medications   bevacizumab (AVASTIN) SOSY 2.5 mg       Return in about 2 weeks (around 03/03/2021) for dilate, OD, COLOR FP, OCT.  There are no Patient Instructions on file for this visit.   Explained the diagnoses, plan, and follow up with the patient and they expressed understanding.  Patient expressed understanding of the importance of proper follow up care.   Clent Demark Daylee Delahoz M.D. Diseases & Surgery of the Retina and Vitreous Retina & Diabetic [place] 02/17/21     Abbreviations: M myopia (nearsighted); A astigmatism; H hyperopia (farsighted); P presbyopia; Mrx spectacle prescription;  CTL contact lenses; OD right eye; OS left eye; OU both eyes  XT exotropia; ET esotropia; PEK punctate epithelial keratitis; PEE punctate epithelial erosions; DES dry eye syndrome; MGD meibomian gland dysfunction; ATs artificial tears; PFAT's preservative free artificial tears; [place] nuclear sclerotic cataract; PSC posterior subcapsular cataract; ERM epi-retinal membrane; PVD posterior vitreous detachment; RD retinal detachment; DM diabetes mellitus; DR diabetic retinopathy; NPDR non-proliferative diabetic retinopathy; PDR proliferative diabetic retinopathy; CSME clinically significant macular edema; DME diabetic macular edema; dbh dot blot hemorrhages; CWS  cotton wool spot; POAG primary open angle glaucoma; C/D cup-to-disc ratio; HVF humphrey visual field; GVF goldmann visual field; OCT optical coherence tomography; IOP intraocular pressure; BRVO Branch retinal vein occlusion; CRVO central retinal vein occlusion; CRAO central retinal artery occlusion; BRAO branch retinal artery occlusion; RT retinal tear; SB scleral buckle; PPV pars plana vitrectomy; VH Vitreous hemorrhage; PRP panretinal laser photocoagulation; IVK intravitreal kenalog; VMT vitreomacular traction; MH Macular hole;  NVD neovascularization of the disc; NVE neovascularization elsewhere; AREDS age related eye disease study; ARMD age related macular degeneration; POAG primary open angle glaucoma; EBMD epithelial/anterior basement membrane dystrophy; ACIOL anterior chamber intraocular lens; IOL intraocular lens; PCIOL posterior chamber intraocular lens; Phaco/IOL phacoemulsification with intraocular lens placement; [place] photorefractive keratectomy; LASIK laser assisted in situ keratomileusis; HTN hypertension; DM diabetes mellitus; COPD chronic obstructive pulmonary disease

## 2021-02-17 NOTE — Assessment & Plan Note (Signed)
OD will continue to monitor as it could contribute to tractional change in the fovea yet subretinal exudate remains

## 2021-03-04 ENCOUNTER — Encounter (INDEPENDENT_AMBULATORY_CARE_PROVIDER_SITE_OTHER): Payer: Self-pay | Admitting: Ophthalmology

## 2021-03-04 ENCOUNTER — Ambulatory Visit (INDEPENDENT_AMBULATORY_CARE_PROVIDER_SITE_OTHER): Payer: Medicare Other | Admitting: Ophthalmology

## 2021-03-04 ENCOUNTER — Other Ambulatory Visit: Payer: Self-pay

## 2021-03-04 DIAGNOSIS — H353211 Exudative age-related macular degeneration, right eye, with active choroidal neovascularization: Secondary | ICD-10-CM

## 2021-03-04 DIAGNOSIS — H35021 Exudative retinopathy, right eye: Secondary | ICD-10-CM | POA: Diagnosis not present

## 2021-03-04 NOTE — Assessment & Plan Note (Addendum)
Vastly improved peripheral exudative disease post Avastin at 2 weeks and resolution of exudative retinal detachments in the macular region  Follow-up and repeat injection Avastin OD as close to 1 month from prior injection 02/17/2021

## 2021-03-04 NOTE — Assessment & Plan Note (Signed)
Vastly improved exudative retinal detachment stent in the macular region 2 weeks post most recent Avastin OD

## 2021-03-04 NOTE — Progress Notes (Signed)
03/04/2021     CHIEF COMPLAINT Patient presents for  Chief Complaint  Patient presents with   Retina Follow Up      HISTORY OF PRESENT ILLNESS: Stephanieann Popescu is a 80 y.o. female who presents to the clinic today for:   HPI     Retina Follow Up   Patient presents with  Other.  In right eye.  This started 2 weeks ago.  Severity is mild.  Duration of 2 weeks.  Since onset it is stable.        Comments   2 weeks fu OD and oct and fp Pt states [facility] OU stable since last visit. Pt denies FOL, floaters, or ocular pain OU.        Last edited by Kendra Opitz, COA on 03/04/2021  9:25 AM.      Referring physician: Celene Squibb, MD [place],  [place] [id number]  HISTORICAL INFORMATION:   Selected notes from the MEDICAL RECORD NUMBER    Lab Results  Component Value Date   HGBA1C 5.6 10/30/2015     CURRENT MEDICATIONS: No current outpatient medications on file. (Ophthalmic Drugs)   No current facility-administered medications for this visit. (Ophthalmic Drugs)   Current Outpatient Medications (Other)  Medication Sig   acetaminophen (TYLENOL) 500 MG tablet Take 1,500 mg by mouth every 4 (four) hours as needed.   albuterol (PROVENTIL) (2.5 MG/3ML) 0.083% nebulizer solution Take 2.5 mg by nebulization every 6 (six) hours as needed for wheezing or shortness of breath.   albuterol (VENTOLIN HFA) 108 (90 Base) MCG/ACT inhaler Inhale 2 puffs into the lungs every 6 (six) hours as needed for wheezing or shortness of breath.   amLODipine-olmesartan (AZOR) 10-40 MG tablet Take 1 tablet by mouth every morning.   benzonatate (TESSALON) 100 MG capsule Take 1 capsule (100 mg total) by mouth every 8 (eight) hours.   buPROPion (WELLBUTRIN XL) 300 MG 24 hr tablet Take 300 mg by mouth daily.   Cholecalciferol (VITAMIN D3) 50 MCG (2000 UT) TABS Take 2,000 Units by mouth daily.   denosumab (PROLIA) 60 MG/ML SOSY injection Inject 60 mg into the skin every 6 (six) months.    ELIQUIS 5 MG TABS tablet TAKE ONE TABLET (5MG TOTAL) BY MOUTH TWOTIMES DAILY   ferrous sulfate 325 (65 FE) MG tablet Take 325 mg by mouth daily with breakfast.    fluvoxaMINE (LUVOX) 100 MG tablet Take 1 tablet (100 mg total) by mouth 2 (two) times daily.   levothyroxine (SYNTHROID) 50 MCG tablet Take 50 mcg by mouth daily before breakfast.    metoprolol succinate (TOPROL-XL) 25 MG 24 hr tablet Take 25 mg by mouth daily.   metoprolol succinate (TOPROL-XL) 50 MG 24 hr tablet Take 50 mg by mouth daily.   nitroGLYCERIN (NITROSTAT) 0.4 MG SL tablet Place 0.4 mg under the tongue every 5 (five) minutes as needed for chest pain.   predniSONE (DELTASONE) 20 MG tablet Take 2 tablets (40 mg total) by mouth daily.   rosuvastatin (CRESTOR) 20 MG tablet Take 20 mg by mouth daily.    sertraline (ZOLOFT) 25 MG tablet Take 25 mg by mouth daily.   TRELEGY ELLIPTA 200-62.5-25 MCG/INH AEPB Inhale 1 puff into the lungs daily.   No current facility-administered medications for this visit. (Other)      REVIEW OF SYSTEMS:    ALLERGIES Allergies  Allergen Reactions   Sulfa Antibiotics Hives   Sulfa Antibiotics Hives and Other (See Comments)  Reaction not recalled by the patient, but "hives" were noted in her other profile in [place] Past Medical History:  Diagnosis Date   A-fib [facility]    Atrial flutter ([place])    On Eliquis in 8/17 but discontinued after stent placement   Breast cancer ([place])    remote   CAD in native artery 03/16/2016   COPD (chronic obstructive pulmonary disease) ([place])    Hypertension    S/P angioplasty with stent 03/15/16 DES Resolute, pLCX 03/16/2016   Vitreous hemorrhage of left eye ([place]) 08/01/2019   Past Surgical History:  Procedure Laterality Date   APPENDECTOMY     BREAST SURGERY Left    CARDIAC CATHETERIZATION N/A 03/15/2016   Procedure: Left Heart Cath and Coronary Angiography;  Surgeon: Belva Crome, MD;  Location: [place] [facility];  Service:  Cardiovascular;  Laterality: N/A;   CARDIAC CATHETERIZATION N/A 03/15/2016   Procedure: Coronary Stent Intervention;  Surgeon: Belva Crome, MD;  Location: [place] [facility];  Service: Cardiovascular;  Laterality: N/A;   COLONOSCOPY     remote   COLONOSCOPY N/A 05/26/2016   Procedure: COLONOSCOPY;  Surgeon: Daneil Dolin, MD;  [facility];  Service: Endoscopy;  Laterality: N/A;  845   ESOPHAGOGASTRODUODENOSCOPY N/A 05/26/2016   Procedure: ESOPHAGOGASTRODUODENOSCOPY (EGD);  Surgeon: Daneil Dolin, MD;  [facility];  Service: Endoscopy;  Laterality: N/A;   MALONEY DILATION N/A 05/26/2016   Procedure: Venia Minks DILATION;  Surgeon: Daneil Dolin, MD;  [facility];  Service: Endoscopy;  Laterality: N/A;   VITRECTOMY Left 10/03/2019   Dr. Zadie Rhine, Vitrectomy, Focal Laser, Removal of Silicone Oil    FAMILY HISTORY Family History  Problem Relation Age of Onset   Heart disease Mother    COPD Father    Cancer Sister        unknown primary   Cancer Brother        unknown primary   Cancer Brother    Hypertension Son    Colon cancer Neg Hx     SOCIAL HISTORY Social History   Tobacco Use   Smoking status: Former    Types: Cigarettes    Quit date: 12/28/2014    Years since quitting: 6.1   Smokeless tobacco: Never   Tobacco comments:    Patient quit within the last 10 years  Vaping Use   Vaping Use: Never used  Substance Use Topics   Alcohol use: Never   Drug use: Never         OPHTHALMIC EXAM:  Base Eye Exam     Visual Acuity (ETDRS)       Right Left   Dist [place] 20/100 -1 20/50 -2   Dist ph  NI NI         Tonometry (Tonopen, 9:29 AM)       Right Left   Pressure 08 09         Pupils       Pupils Dark Light Shape React APD   Right PERRL 6 5 Round Brisk None   Left PERRL 6 5 Round Brisk None         Visual Fields       Left Right   Restrictions Partial outer superior temporal, inferior temporal, superior nasal,  inferior nasal deficiencies Total superior nasal, inferior nasal deficiencies; Partial outer superior temporal, inferior temporal deficiencies         Extraocular Movement  Right Left    Full Full         Neuro/Psych     Oriented x3: Yes   Mood/Affect: Normal         Dilation     Right eye: 1.0% Mydriacyl, 2.5% Phenylephrine @ 9:29 AM           Slit Lamp and Fundus Exam     External Exam       Right Left   External Normal Normal         Slit Lamp Exam       Right Left   Lids/Lashes Normal Normal   Conjunctiva/Sclera White and quiet White and quiet   Cornea Clear Clear   Anterior Chamber Deep and quiet Deep and quiet   Iris Round and reactive Round and reactive   Lens Posterior chamber intraocular lens Posterior chamber intraocular lens   Anterior Vitreous Normal Normal         Fundus Exam       Right Left   Posterior Vitreous Normal, clear no breakthrough hemorrhage Clear, vitrectomized, no oil   Disc Normal Normal   C/D Ratio 0.4 0.25   Macula Hemorrhagic sub-RPE and subretinal blood, less extensive into the posterior pole Normal   Vessels Normal Normal   Periphery peripheral panretinal photocoagulation, with less active overlying peripheral hemorrhagic choroidopathy [facility]) in regions of previous peripheral PRP to the choroid and attempt to prevent this type of hemorrhagic disease.  Large subretinal hemorrhage extending from the 8:30 position anteriorly to the 12 o'clock position superiorly and posteriorly into the macula much of it subretinal some of it sub-RPE some resolution of the multifocal hemorrhagic choroidopathy, overall vastly improved clinical findings as compared to August 2022 and February 17, 2021, with darkening of the subretinal and choroidal hemorrhage, much less extensive exudative retinopathy yet as this has receded there is a subretinal traction band temporally extending from 1 large choroidal elevation to the other, , as  sub-RPE and subretinal hemorrhage in the macula has receded Peripheral chorioretinal scars of laser retinopexy as well as previous sites of hemorrhagic choroidopathy,, no hemorrhage, good laser photocoagulation, retina attached.,  Clear media            IMAGING AND PROCEDURES  Imaging and Procedures for 03/04/21  OCT, Retina - OU - Both Eyes       Right Eye Quality was good. Scan locations included subfoveal. Central Foveal Thickness: 354. Progression has worsened. Findings include abnormal foveal contour, subretinal hyper-reflective material, epiretinal membrane.   Left Eye Quality was good. Scan locations included subfoveal. Central Foveal Thickness: 266. Findings include abnormal foveal contour, retinal drusen , no SRF, no IRF.   Notes OD much less exudative retinal detachment in the posterior pole threatening the fovea as compared to 2 weeks previous prior to most recent injection Avastin OD thus this confirms this is not a rhegmatogenous retinal detachment but ongoing exudative changes.  Will continue to treat extra macular PECHR OD with antivegF follow-up as close to 1 month follow-up as possible from last injection OD  OS no change     Color Fundus Photography Optos - OU - Both Eyes       Right Eye Progression has improved. Disc findings include normal observations. Macula : normal observations. Vessels : normal observations. Periphery : hemorrhage.   Left Eye Progression has been stable. Disc findings include normal observations. Macula : normal observations. Vessels : normal observations.   Notes Subretinal and choroidal hemorrhage of PECHR, temporally,  recurrent and massive with sub-RPE and subretinal blood from the 8:00 to the 12 o'clock position anteriorly as well as extending posteriorly into the macula vastly improved and in fact much less sub-RPE and subretinal heme as compared to August 2022 and much less since recent injection OD 2 weeks previous receding from  the [place] OD   OS clear media and attached after repair of retinal detachment from [facility] some years previous             ASSESSMENT/PLAN:  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[place]) Vastly improved peripheral exudative disease post Avastin at 2 weeks and resolution of exudative retinal detachments in the macular region  Follow-up and repeat injection Avastin OD as close to 1 month from prior injection 02/17/2021  Exudative retinopathy of right eye Vastly improved exudative retinal detachment stent in the macular region 2 weeks post most recent Avastin OD      ICD-10-CM   1. 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HCC)  H35.3211 OCT, Retina - OU - Both Eyes    Color Fundus Photography Optos - OU - Both Eyes    2. Exudative retinopathy of right eye  H35.021       1.  OD vastly improved macular exudative retinal detachment 2 weeks post injection Avastin for peripheral exudative disease, PECHR  2.  3.  Ophthalmic Meds Ordered this visit:  No orders of the defined types were placed in this encounter.      Return in about 18 days (around 03/22/2021) for No more than 20 days and no less than 16 days, dilate, OD, AVASTIN OCT.  There are no Patient Instructions on file for this visit.   Explained the diagnoses, plan, and follow up with the patient and they expressed understanding.  Patient expressed understanding of the importance of proper follow up care.   Clent Demark Joniya Boberg M.D. Diseases & Surgery of the Retina and Vitreous Retina & Diabetic [place] 03/04/21     Abbreviations: M myopia (nearsighted); A astigmatism; H hyperopia (farsighted); P presbyopia; Mrx spectacle prescription;  CTL contact lenses; OD right eye; OS left eye; OU both eyes  XT exotropia; ET esotropia; PEK punctate epithelial keratitis; PEE punctate epithelial erosions; DES dry eye  syndrome; MGD meibomian gland dysfunction; ATs artificial tears; PFAT's preservative free artificial tears; [place] nuclear sclerotic cataract; PSC posterior subcapsular cataract; ERM epi-retinal membrane; PVD posterior vitreous detachment; RD retinal detachment; DM diabetes mellitus; DR diabetic retinopathy; NPDR non-proliferative diabetic retinopathy; PDR proliferative diabetic retinopathy; CSME clinically significant macular edema; DME diabetic macular edema; dbh dot blot hemorrhages; CWS cotton wool spot; POAG primary open angle glaucoma; C/D cup-to-disc ratio; HVF humphrey visual field; GVF goldmann visual field; OCT optical coherence tomography; IOP intraocular pressure; BRVO Branch retinal vein occlusion; CRVO central retinal vein occlusion; CRAO central retinal artery occlusion; BRAO branch retinal artery occlusion; RT retinal tear; SB scleral buckle; PPV pars plana vitrectomy; VH Vitreous hemorrhage; PRP panretinal laser photocoagulation; IVK intravitreal kenalog; VMT vitreomacular traction; MH Macular hole;  NVD neovascularization of the disc; NVE neovascularization elsewhere; AREDS age related eye disease study; ARMD age related macular degeneration; POAG primary open angle glaucoma; EBMD epithelial/anterior basement membrane dystrophy; ACIOL anterior chamber intraocular lens; IOL intraocular lens; PCIOL posterior chamber intraocular lens; Phaco/IOL phacoemulsification with intraocular lens placement; [place] photorefractive keratectomy; LASIK laser assisted in situ keratomileusis; HTN hypertension; DM diabetes mellitus; COPD chronic obstructive pulmonary disease

## 2021-03-19 ENCOUNTER — Encounter (INDEPENDENT_AMBULATORY_CARE_PROVIDER_SITE_OTHER): Payer: Medicare Other | Admitting: Ophthalmology

## 2021-03-23 ENCOUNTER — Ambulatory Visit (INDEPENDENT_AMBULATORY_CARE_PROVIDER_SITE_OTHER): Payer: Medicare Other | Admitting: Ophthalmology

## 2021-03-23 ENCOUNTER — Encounter (INDEPENDENT_AMBULATORY_CARE_PROVIDER_SITE_OTHER): Payer: Self-pay | Admitting: Ophthalmology

## 2021-03-23 ENCOUNTER — Other Ambulatory Visit: Payer: Self-pay

## 2021-03-23 ENCOUNTER — Encounter (INDEPENDENT_AMBULATORY_CARE_PROVIDER_SITE_OTHER): Payer: Medicare Other | Admitting: Ophthalmology

## 2021-03-23 DIAGNOSIS — H35022 Exudative retinopathy, left eye: Secondary | ICD-10-CM

## 2021-03-23 DIAGNOSIS — H353211 Exudative age-related macular degeneration, right eye, with active choroidal neovascularization: Secondary | ICD-10-CM

## 2021-03-23 DIAGNOSIS — H35021 Exudative retinopathy, right eye: Secondary | ICD-10-CM | POA: Diagnosis not present

## 2021-03-23 DIAGNOSIS — H35371 Puckering of macula, right eye: Secondary | ICD-10-CM

## 2021-03-23 MED ORDER — BEVACIZUMAB 2.5 MG/0.1ML IZ SOSY
2.5000 mg | PREFILLED_SYRINGE | INTRAVITREAL | Status: AC | PRN
  Administered 2021-03-23: 10:00:00 2.5 mg via INTRAVITREAL

## 2021-03-23 NOTE — Assessment & Plan Note (Signed)
Vastly improved PECHR, no active subretinal or sub-RPE hemorrhages.  Post peripheral PRP may need further D limiting.  Large region of old sub-RPE and subretinal hemorrhage superotemporal peripheral quadrant continues to regress with combination of previous laser demarcation and now antivegF medications combined.  Incidental subretinal bands are forming temporally, no evidence of retinal detachment.  Will continue to observe

## 2021-03-23 NOTE — Progress Notes (Signed)
03/23/2021     CHIEF COMPLAINT Patient presents for  Chief Complaint  Patient presents with   Retina Follow Up      HISTORY OF PRESENT ILLNESS: Patricia Jackson is a 80 y.o. female who presents to the clinic today for:   HPI     Retina Follow Up           Diagnosis: Wet AMD   Laterality: right eye   Onset: 5 weeks ago   Severity: mild   Duration: 5 weeks   Course: stable         Comments   4 wk 6 day dilate OD and OCT/ Avastin OD for Exudative Macular Degeneration   Pt states, " I am unable to tell any changes in my actual vision but for the last week I have been having FOL in the bottom of my vision in my right eye. It stays pretty much constant. I am not noticing any new floaters in my right eye but my left eye seems to have more floaters now."          Last edited by Kendra Opitz, COA on 03/23/2021  9:24 AM.      Referring physician: Celene Squibb, MD [place],  [place] [id number]  HISTORICAL INFORMATION:   Selected notes from the MEDICAL RECORD NUMBER    Lab Results  Component Value Date   HGBA1C 5.6 10/30/2015     CURRENT MEDICATIONS: No current outpatient medications on file. (Ophthalmic Drugs)   No current facility-administered medications for this visit. (Ophthalmic Drugs)   Current Outpatient Medications (Other)  Medication Sig   acetaminophen (TYLENOL) 500 MG tablet Take 1,500 mg by mouth every 4 (four) hours as needed.   albuterol (PROVENTIL) (2.5 MG/3ML) 0.083% nebulizer solution Take 2.5 mg by nebulization every 6 (six) hours as needed for wheezing or shortness of breath.   albuterol (VENTOLIN HFA) 108 (90 Base) MCG/ACT inhaler Inhale 2 puffs into the lungs every 6 (six) hours as needed for wheezing or shortness of breath.   amLODipine-olmesartan (AZOR) 10-40 MG tablet Take 1 tablet by mouth every morning.   benzonatate (TESSALON) 100 MG capsule Take 1 capsule (100 mg total) by mouth every 8 (eight) hours.    buPROPion (WELLBUTRIN XL) 300 MG 24 hr tablet Take 300 mg by mouth daily.   Cholecalciferol (VITAMIN D3) 50 MCG (2000 UT) TABS Take 2,000 Units by mouth daily.   denosumab (PROLIA) 60 MG/ML SOSY injection Inject 60 mg into the skin every 6 (six) months.   ELIQUIS 5 MG TABS tablet TAKE ONE TABLET (5MG  TOTAL) BY MOUTH TWOTIMES DAILY   ferrous sulfate 325 (65 FE) MG tablet Take 325 mg by mouth daily with breakfast.    fluvoxaMINE (LUVOX) 100 MG tablet Take 1 tablet (100 mg total) by mouth 2 (two) times daily.   levothyroxine (SYNTHROID) 50 MCG tablet Take 50 mcg by mouth daily before breakfast.    metoprolol succinate (TOPROL-XL) 25 MG 24 hr tablet Take 25 mg by mouth daily.   metoprolol succinate (TOPROL-XL) 50 MG 24 hr tablet Take 50 mg by mouth daily.   nitroGLYCERIN (NITROSTAT) 0.4 MG SL tablet Place 0.4 mg under the tongue every 5 (five) minutes as needed for chest pain.   predniSONE (DELTASONE) 20 MG tablet Take 2 tablets (40 mg total) by mouth daily.   rosuvastatin (CRESTOR) 20 MG tablet Take 20 mg by mouth daily.    sertraline (ZOLOFT) 25 MG tablet Take  25 mg by mouth daily.   TRELEGY ELLIPTA 200-62.5-25 MCG/INH AEPB Inhale 1 puff into the lungs daily.   No current facility-administered medications for this visit. (Other)      REVIEW OF SYSTEMS:    ALLERGIES Allergies  Allergen Reactions   Sulfa Antibiotics Hives   Sulfa Antibiotics Hives and Other (See Comments)    Reaction not recalled by the patient, but "hives" were noted in her other profile in Epic    PAST MEDICAL HISTORY Past Medical History:  Diagnosis Date   A-fib [facility])    Atrial flutter ([place])    On Eliquis in 8/17 but discontinued after stent placement   Breast cancer ([place])    remote   CAD in native artery 03/16/2016   COPD (chronic obstructive pulmonary disease) ([place])    Hypertension    S/P angioplasty with stent 03/15/16 DES Resolute, pLCX 03/16/2016   Vitreous hemorrhage of left eye ([place]) 08/01/2019    Past Surgical History:  Procedure Laterality Date   APPENDECTOMY     BREAST SURGERY Left    CARDIAC CATHETERIZATION N/A 03/15/2016   Procedure: Left Heart Cath and Coronary Angiography;  Surgeon: Belva Crome, MD;  Location: [place] [facility];  Service: Cardiovascular;  Laterality: N/A;   CARDIAC CATHETERIZATION N/A 03/15/2016   Procedure: Coronary Stent Intervention;  Surgeon: Belva Crome, MD;  Location: [place] [facility];  Service: Cardiovascular;  Laterality: N/A;   COLONOSCOPY     remote   COLONOSCOPY N/A 05/26/2016   Procedure: COLONOSCOPY;  Surgeon: Daneil Dolin, MD;  [facility];  Service: Endoscopy;  Laterality: N/A;  845   ESOPHAGOGASTRODUODENOSCOPY N/A 05/26/2016   Procedure: ESOPHAGOGASTRODUODENOSCOPY (EGD);  Surgeon: Daneil Dolin, MD;  [facility];  Service: Endoscopy;  Laterality: N/A;   MALONEY DILATION N/A 05/26/2016   Procedure: Venia Minks DILATION;  Surgeon: Daneil Dolin, MD;  [facility];  Service: Endoscopy;  Laterality: N/A;   VITRECTOMY Left 10/03/2019   Dr. Zadie Rhine, Vitrectomy, Focal Laser, Removal of Silicone Oil    FAMILY HISTORY Family History  Problem Relation Age of Onset   Heart disease Mother    COPD Father    Cancer Sister        unknown primary   Cancer Brother        unknown primary   Cancer Brother    Hypertension Son    Colon cancer Neg Hx     SOCIAL HISTORY Social History   Tobacco Use   Smoking status: Former    Types: Cigarettes    Quit date: 12/28/2014    Years since quitting: 6.2   Smokeless tobacco: Never   Tobacco comments:    Patient quit within the last 10 years  Vaping Use   Vaping Use: Never used  Substance Use Topics   Alcohol use: Never   Drug use: Never         OPHTHALMIC EXAM:  Base Eye Exam     Visual Acuity (ETDRS)       Right Left   Dist [place] 20/80 -1 20/50   Dist ph [place] NI NI         Tonometry (Tonopen, 9:21 AM)       Right Left   Pressure 10 09          Pupils       Pupils Shape React APD   Right PERRL Round Brisk None   Left PERRL Round Brisk None  Visual Fields       Left Right   Restrictions Partial outer superior temporal, inferior temporal, superior nasal, inferior nasal deficiencies Partial outer superior temporal, inferior temporal, superior nasal, inferior nasal deficiencies         Extraocular Movement       Right Left    Full, Ortho Full, Ortho         Neuro/Psych     Oriented x3: Yes   Mood/Affect: Normal         Dilation     Right eye: 1.0% Mydriacyl, 2.5% Phenylephrine @ 9:21 AM           Slit Lamp and Fundus Exam     External Exam       Right Left   External Normal Normal         Slit Lamp Exam       Right Left   Lids/Lashes Normal Normal   Conjunctiva/Sclera White and quiet White and quiet   Cornea Clear Clear   Anterior Chamber Deep and quiet Deep and quiet   Iris Round and reactive Round and reactive   Lens Posterior chamber intraocular lens Posterior chamber intraocular lens   Anterior Vitreous Normal Normal         Fundus Exam       Right Left   Posterior Vitreous Normal, clear no breakthrough hemorrhage, clear media Clear, vitrectomized, no oil   Disc Normal Normal   C/D Ratio 0.4 0.25   Macula Hemorrhagic sub-RPE and old subretinal blood peripheral, superotemporal quadrant, temporally, subretinal fibrous bands remain in regions of prior hemorrhage, no hemorrhage in the posterior pole, good peripheral demarcation laser Normal   Vessels Normal Normal   Periphery peripheral panretinal photocoagulation, with less active overlying peripheral hemorrhagic choroidopathy [facility]) in regions of previous peripheral PRP to the choroid and attempt to prevent this type of hemorrhagic disease.  Large subretinal hemorrhage extending from the 8:30 position anteriorly to the 12 o'clock position superiorly and posteriorly into the macula much of it subretinal some of it sub-RPE  some resolution of the multifocal hemorrhagic choroidopathy, overall vastly improved clinical findings as compared to August 2022 and February 17, 2021, with darkening of the subretinal and choroidal hemorrhage, much less extensive exudative retinopathy yet as this has receded there is a subretinal traction band temporally extending from 1 large choroidal elevation to the other, , as sub-RPE and subretinal hemorrhage in the macula has receded Peripheral chorioretinal scars of laser retinopexy as well as previous sites of hemorrhagic choroidopathy,, no hemorrhage, good laser photocoagulation, retina attached.,  Clear media            IMAGING AND PROCEDURES  Imaging and Procedures for 03/23/21  OCT, Retina - OU - Both Eyes       Right Eye Quality was good. Scan locations included subfoveal. Central Foveal Thickness: 354. Progression has worsened. Findings include abnormal foveal contour, subretinal hyper-reflective material, epiretinal membrane.   Left Eye Quality was good. Scan locations included subfoveal. Central Foveal Thickness: 257. Findings include abnormal foveal contour, retinal drusen , no SRF, no IRF.   Notes Minor CME perifoveal OD.  May be from ERM  Will continue to treat extra macular PECHR OD with antivegF follow-up as close to 1 month follow-up as possible fr  OS no change     Intravitreal Injection, Pharmacologic Agent - OD - Right Eye       Time Out 03/23/2021. 10:10 AM. Confirmed correct patient, procedure, site, and patient consented.  Anesthesia Topical anesthesia was used. Anesthetic medications included Lidocaine 4%.   Procedure Preparation included 10% betadine to eyelids, 5% betadine to ocular surface, Tobramycin 0.3%. A 30 gauge needle was used.   Injection: 2.5 mg bevacizumab 2.5 MG/0.1ML   Route: Intravitreal, Site: Right Eye   NDC: [phone], Lot: [id number]   Post-op Post injection exam found visual acuity of at least counting fingers. The  patient tolerated the procedure well. There were no complications. The patient received written and verbal post procedure care education. Post injection medications included ocuflox.              ASSESSMENT/PLAN:  Exudative retinopathy of left eye Regressed OS, not active  Exudative retinopathy of right eye Vastly improved PECHR, no active subretinal or sub-RPE hemorrhages.  Post peripheral PRP may need further D limiting.  Large region of old sub-RPE and subretinal hemorrhage superotemporal peripheral quadrant continues to regress with combination of previous laser demarcation and now antivegF medications combined.  Incidental subretinal bands are forming temporally, no evidence of retinal detachment.  Will continue to observe  Right epiretinal membrane Moderate epiretinal membrane OD progressing may account for some of the acuity now new perifoveal CME will monitor to see if an eye vegF also helps the CME component      ICD-10-CM   1. 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HCC)  H35.3211 OCT, Retina - OU - Both Eyes    Intravitreal Injection, Pharmacologic Agent - OD - Right Eye    bevacizumab (AVASTIN) SOSY 2.5 mg    2. Exudative retinopathy of left eye  H35.022     3. Exudative retinopathy of right eye  H35.021     4. Right epiretinal membrane  H35.371       1.  2.  3.  Ophthalmic Meds Ordered this visit:  Meds ordered this encounter  Medications   bevacizumab (AVASTIN) SOSY 2.5 mg       Return in about 5 weeks (around 04/27/2021) for dilate, OD, AVASTIN OCT.  There are no Patient Instructions on file for this visit.   Explained the diagnoses, plan, and follow up with the patient and they expressed understanding.  Patient expressed understanding of the importance of proper follow up care.   Clent Demark Fareeha Evon M.D. Diseases & Surgery of the Retina and Vitreous Retina & Diabetic [place] 03/23/21     Abbreviations: M myopia (nearsighted); A astigmatism; H hyperopia (farsighted); P presbyopia; Mrx spectacle prescription;  CTL contact lenses; OD right eye; OS left eye; OU both eyes  XT exotropia; ET esotropia; PEK punctate epithelial keratitis; PEE punctate epithelial erosions; DES dry eye syndrome; MGD meibomian gland dysfunction; ATs artificial tears; PFAT's preservative free artificial tears; [place] nuclear sclerotic cataract; PSC posterior subcapsular cataract; ERM epi-retinal membrane; PVD posterior vitreous detachment; RD retinal detachment; DM diabetes mellitus; DR diabetic retinopathy; NPDR non-proliferative diabetic retinopathy; PDR proliferative diabetic retinopathy; CSME clinically significant macular edema; DME diabetic macular edema; dbh dot blot hemorrhages; CWS cotton wool spot; POAG primary open angle glaucoma; C/D cup-to-disc ratio; HVF humphrey visual field; GVF goldmann visual field; OCT optical coherence tomography; IOP intraocular pressure; BRVO Branch retinal vein occlusion; CRVO central retinal vein occlusion; CRAO central retinal artery occlusion; BRAO branch retinal artery occlusion; RT retinal tear; SB scleral buckle; PPV pars plana vitrectomy; VH Vitreous hemorrhage; PRP panretinal laser photocoagulation; IVK intravitreal kenalog; VMT vitreomacular traction; MH Macular hole;  NVD neovascularization of the disc; NVE neovascularization elsewhere; AREDS age related eye  disease study; ARMD age related macular degeneration; POAG primary open angle glaucoma; EBMD epithelial/anterior basement membrane dystrophy; ACIOL anterior chamber intraocular lens; IOL intraocular lens; PCIOL posterior chamber intraocular lens; Phaco/IOL phacoemulsification with intraocular lens placement; [place] photorefractive keratectomy; LASIK laser assisted in situ keratomileusis; HTN hypertension; DM diabetes mellitus; COPD chronic obstructive pulmonary disease

## 2021-03-23 NOTE — Assessment & Plan Note (Signed)
Regressed OS, not active

## 2021-03-23 NOTE — Assessment & Plan Note (Signed)
Moderate epiretinal membrane OD progressing may account for some of the acuity now new perifoveal CME will monitor to see if an eye vegF also helps the CME component

## 2021-04-27 ENCOUNTER — Inpatient Hospital Stay (HOSPITAL_COMMUNITY): Payer: Medicare Other | Attending: Hematology

## 2021-04-29 ENCOUNTER — Ambulatory Visit (INDEPENDENT_AMBULATORY_CARE_PROVIDER_SITE_OTHER): Payer: Medicare Other | Admitting: Ophthalmology

## 2021-04-29 ENCOUNTER — Encounter (INDEPENDENT_AMBULATORY_CARE_PROVIDER_SITE_OTHER): Payer: Self-pay | Admitting: Ophthalmology

## 2021-04-29 ENCOUNTER — Other Ambulatory Visit: Payer: Self-pay

## 2021-04-29 DIAGNOSIS — H31401 Unspecified choroidal detachment, right eye: Secondary | ICD-10-CM

## 2021-04-29 DIAGNOSIS — H35021 Exudative retinopathy, right eye: Secondary | ICD-10-CM

## 2021-04-29 DIAGNOSIS — H35371 Puckering of macula, right eye: Secondary | ICD-10-CM | POA: Diagnosis not present

## 2021-04-29 DIAGNOSIS — H353211 Exudative age-related macular degeneration, right eye, with active choroidal neovascularization: Secondary | ICD-10-CM

## 2021-04-29 DIAGNOSIS — H35022 Exudative retinopathy, left eye: Secondary | ICD-10-CM | POA: Diagnosis not present

## 2021-04-29 MED ORDER — BEVACIZUMAB 2.5 MG/0.1ML IZ SOSY
2.5000 mg | PREFILLED_SYRINGE | INTRAVITREAL | Status: AC | PRN
  Administered 2021-04-29: 11:00:00 2.5 mg via INTRAVITREAL

## 2021-04-29 NOTE — Assessment & Plan Note (Signed)
Quiescent, with good peripheral PRP

## 2021-04-29 NOTE — Assessment & Plan Note (Signed)
Near complete resolution of peripheral hemorrhage now using antivegF long-term,

## 2021-04-29 NOTE — Assessment & Plan Note (Signed)
Moderate and progressing accounts for acuity OD

## 2021-04-29 NOTE — Progress Notes (Signed)
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  OS similar history from years ago with good acuity preserved    HISTORY OF PRESENT ILLNESS: Patricia Jackson is a 81 y.o. female who presents to the clinic today for:   HPI     Retina Follow Up           Diagnosis: Wet AMD   Laterality: right eye   Onset: 5 weeks ago   Severity: mild   Duration: 5 weeks   Course: stable         Comments   5 week for dilate OD and Avastin OD, OCT  Pt states, "I have this shadow that is dark and jumps around in my right eye. I am not able to get it to go away. It started since I was here last time. I am not noticing anything new with my left eye."        Last edited by Kendra Opitz, COA on 04/29/2021  9:19 AM.      Referring physician: Celene Squibb, MD [place],  [place] [address]  HISTORICAL INFORMATION:   Selected notes from the MEDICAL RECORD NUMBER    Lab Results  Component Value Date   HGBA1C 5.6 10/30/2015     CURRENT MEDICATIONS: No current outpatient medications on file. (Ophthalmic Drugs)   No current facility-administered medications for this visit. (Ophthalmic Drugs)   Current Outpatient Medications (Other)  Medication Sig   acetaminophen (TYLENOL) 500 MG tablet Take 1,500 mg by mouth every 4 (four) hours as needed.   albuterol (PROVENTIL) (2.5 MG/3ML) 0.083% nebulizer solution Take 2.5 mg by nebulization every 6 (six) hours as needed for wheezing or shortness of breath.   albuterol (VENTOLIN HFA) 108 (90 Base) MCG/ACT inhaler Inhale 2 puffs into the lungs every 6 (six) hours as needed for wheezing or shortness of breath.   amLODipine-olmesartan (AZOR) 10-40 MG tablet Take 1 tablet by mouth every morning.    benzonatate (TESSALON) 100 MG capsule Take 1 capsule (100 mg total) by mouth every 8 (eight) hours.   buPROPion (WELLBUTRIN XL) 300 MG 24 hr tablet Take 300 mg by mouth daily.   Cholecalciferol (VITAMIN D3) 50 MCG (2000 UT) TABS Take 2,000 Units by mouth daily.   denosumab (PROLIA) 60 MG/ML SOSY injection Inject 60 mg into the skin every 6 (six) months.   ELIQUIS 5 MG TABS tablet TAKE ONE TABLET (5MG  TOTAL) BY MOUTH TWOTIMES DAILY   ferrous sulfate 325 (65 FE) MG tablet Take 325 mg by mouth daily with breakfast.    fluvoxaMINE (LUVOX) 100 MG tablet Take 1 tablet (100 mg total) by mouth 2 (two) times daily.   levothyroxine (SYNTHROID) 50 MCG tablet Take 50 mcg by mouth daily before breakfast.    metoprolol succinate (TOPROL-XL) 25 MG 24 hr tablet Take 25 mg by mouth daily.   metoprolol succinate (TOPROL-XL) 50 MG 24 hr tablet Take 50 mg by mouth daily.   nitroGLYCERIN (NITROSTAT) 0.4 MG SL tablet Place 0.4 mg under the tongue every 5 (five) minutes as needed for chest pain.   predniSONE (DELTASONE) 20 MG tablet Take 2 tablets (40 mg total) by mouth daily.   rosuvastatin (CRESTOR) 20 MG tablet Take 20 mg  by mouth daily.    sertraline (ZOLOFT) 25 MG tablet Take 25 mg by mouth daily.   TRELEGY ELLIPTA 200-62.5-25 MCG/INH AEPB Inhale 1 puff into the lungs daily.   No current facility-administered medications for this visit. (Other)      REVIEW OF SYSTEMS: ROS   Negative for: Constitutional, Gastrointestinal, Neurological, Skin, Genitourinary, Musculoskeletal, HENT, Endocrine, Cardiovascular, Eyes, Respiratory, Psychiatric, Allergic/Imm, Heme/Lymph Last edited by Hurman Horn, MD on 04/29/2021 10:29 AM.       ALLERGIES Allergies  Allergen Reactions   Sulfa Antibiotics Hives   Sulfa Antibiotics Hives and Other (See Comments)    Reaction not recalled by the patient, but "hives" were noted in her other profile in Epic    PAST MEDICAL HISTORY Past Medical History:  Diagnosis Date    A-fib [facility])    Atrial flutter ([place])    On Eliquis in 8/17 but discontinued after stent placement   Breast cancer ([place])    remote   CAD in native artery 03/16/2016   COPD (chronic obstructive pulmonary disease) ([place])    Hypertension    S/P angioplasty with stent 03/15/16 DES Resolute, pLCX 03/16/2016   Vitreous hemorrhage of left eye ([place]) 08/01/2019   Past Surgical History:  Procedure Laterality Date   APPENDECTOMY     BREAST SURGERY Left    CARDIAC CATHETERIZATION N/A 03/15/2016   Procedure: Left Heart Cath and Coronary Angiography;  Surgeon: Belva Crome, MD;  Location: [place] CV [facility];  Service: Cardiovascular;  Laterality: N/A;   CARDIAC CATHETERIZATION N/A 03/15/2016   Procedure: Coronary Stent Intervention;  Surgeon: Belva Crome, MD;  Location: [place] CV LAB;  Service: Cardiovascular;  Laterality: N/A;   COLONOSCOPY     remote   COLONOSCOPY N/A 05/26/2016   Procedure: COLONOSCOPY;  Surgeon: Daneil Dolin, MD;  [facility];  Service: Endoscopy;  Laterality: N/A;  845   ESOPHAGOGASTRODUODENOSCOPY N/A 05/26/2016   Procedure: ESOPHAGOGASTRODUODENOSCOPY (EGD);  Surgeon: Daneil Dolin, MD;  [facility];  Service: Endoscopy;  Laterality: N/A;   MALONEY DILATION N/A 05/26/2016   Procedure: Venia Minks DILATION;  Surgeon: Daneil Dolin, MD;  [facility];  Service: Endoscopy;  Laterality: N/A;   VITRECTOMY Left 10/03/2019   Dr. Zadie Rhine, Vitrectomy, Focal Laser, Removal of Silicone Oil    FAMILY HISTORY Family History  Problem Relation Age of Onset   Heart disease Mother    COPD Father    Cancer Sister        unknown primary   Cancer Brother        unknown primary   Cancer Brother    Hypertension Son    Colon cancer Neg Hx     SOCIAL HISTORY Social History   Tobacco Use   Smoking status: Former    Types: Cigarettes    Quit date: 12/28/2014    Years since quitting: 6.3   Smokeless tobacco: Never   Tobacco comments:    Patient  quit within the last 10 years  Vaping Use   Vaping Use: Never used  Substance Use Topics   Alcohol use: Never   Drug use: Never         OPHTHALMIC EXAM:  Base Eye Exam     Visual Acuity (ETDRS)       Right Left   Dist cc 20/60 -1 20/50   Dist ph cc NI NI    Correction: Glasses         Tonometry (Tonopen, 9:22  AM)       Right Left   Pressure 10 09         Pupils       Pupils Dark Light Shape React APD   Right PERRL 4 3 Round Brisk None   Left PERRL 4 3 Round Brisk None         Visual Fields       Left Right   Restrictions Partial outer superior temporal, inferior temporal, superior nasal, inferior nasal deficiencies Partial outer superior temporal, inferior temporal, superior nasal, inferior nasal deficiencies         Extraocular Movement       Right Left    Full, Ortho Full, Ortho         Neuro/Psych     Oriented x3: Yes   Mood/Affect: Normal         Dilation     Right eye: 1.0% Mydriacyl, 2.5% Phenylephrine @ 9:22 AM           Slit Lamp and Fundus Exam     External Exam       Right Left   External Normal Normal         Slit Lamp Exam       Right Left   Lids/Lashes Normal Normal   Conjunctiva/Sclera White and quiet White and quiet   Cornea Clear Clear   Anterior Chamber Deep and quiet Deep and quiet   Iris Round and reactive Round and reactive   Lens Posterior chamber intraocular lens Posterior chamber intraocular lens   Anterior Vitreous Normal Normal         Fundus Exam       Right Left   Posterior Vitreous Normal, clear no breakthrough hemorrhage, clear media    Disc Normal    C/D Ratio 0.4    Macula Epiretinal membrane, moderate topographic distortion    Vessels Normal    Periphery peripheral panretinal photocoagulation, with less active overlying peripheral hemorrhagic choroidopathy [facility]) in regions of previous peripheral PRP to the choroid and attempt to prevent this type of hemorrhagic disease.  Large  subretinal hemorrhage extending from the 8:30 position anteriorly to the 12 o'clock position superiorly and posteriorly into the macula much of it subretinal some of it sub-RPE some resolution of the multifocal hemorrhagic choroidopathy, overall vastly improved clinical findings as compared to August 2022 and February 17, 2021, with darkening of the subretinal and choroidal hemorrhage, much less extensive exudative retinopathy yet as this has receded there is a subretinal traction band temporally extending from 1 large choroidal elevation to the other, , as sub-RPE and subretinal hemorrhage in the macula has receded             IMAGING AND PROCEDURES  Imaging and Procedures for 04/29/21  Intravitreal Injection, Pharmacologic Agent - OD - Right Eye       Time Out 04/29/2021. 10:30 AM. Confirmed correct patient, procedure, site, and patient consented.   Anesthesia Topical anesthesia was used. Anesthetic medications included Lidocaine 4%.   Procedure Preparation included 10% betadine to eyelids, 5% betadine to ocular surface, Tobramycin 0.3%. A 30 gauge needle was used.   Injection: 2.5 mg bevacizumab 2.5 MG/0.1ML   Route: Intravitreal, Site: Right Eye   NDC: [phone], Lot: [id number]   Post-op Post injection exam found visual acuity of at least counting fingers. The patient tolerated the procedure well. There were no complications. The patient received written and verbal post procedure care education. Post injection medications included ocuflox.  OCT, Retina - OU - Both Eyes       Right Eye Quality was good. Scan locations included subfoveal. Central Foveal Thickness: 382. Progression has worsened. Findings include abnormal foveal contour, subretinal hyper-reflective material, epiretinal membrane.   Left Eye Quality was good. Scan locations included subfoveal. Central Foveal Thickness: 264. Findings include abnormal foveal contour, retinal drusen , no SRF, no IRF.    Notes Minor CME perifoveal OD.  Residual thickening from epiretinal membrane  Will continue to treat extra macular PECHR OD with antivegF follow-up as close to 1 month follow-up as possible   OS no change             ASSESSMENT/PLAN:  Right epiretinal membrane Moderate and progressing accounts for acuity OD  Exudative retinopathy of left eye Quiescent, with good peripheral PRP  Exudative retinopathy of right eye Near complete resolution of peripheral hemorrhage now using antivegF long-term,  Choroidal detachment of right eye Spontaneous resolution of hemorrhagic choroidal detachments as well using antivegF     ICD-10-CM   1. 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HCC)  H35.3211 Intravitreal Injection, Pharmacologic Agent - OD - Right Eye    OCT, Retina - OU - Both Eyes    bevacizumab (AVASTIN) SOSY 2.5 mg    2. Right epiretinal membrane  H35.371     3. Exudative retinopathy of left eye  H35.022     4. Exudative retinopathy of right eye  H35.021     5. 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  Repeat antivegF OD today to preserve peripheral and posterior retina.  Maintain acuity improvement.  2.  Final acuity will need to be obtained by vitrectomy membrane peel of ILM OD, [id number]  3.  We will schedule in the near future, risk benefits reviewed  Ophthalmic Meds Ordered this visit:  Meds ordered this encounter  Medications   bevacizumab (AVASTIN) SOSY 2.5 mg       Return ,, [facility], [facility],, H35.371, for Schedule vitrectomy membrane [phone], OD.  There are no Patient Instructions on file for this visit.   Explained the diagnoses, plan, and follow up with the patient and they expressed understanding.  Patient expressed understanding of the importance of proper follow up care.   Clent Demark Takeesha Isley M.D. Diseases & Surgery of the Retina and Vitreous Retina [facility] Diabetic [place] 04/29/21     Abbreviations: M myopia (nearsighted); A astigmatism; H hyperopia (farsighted); P presbyopia; Mrx spectacle prescription;  CTL contact lenses; OD right eye; OS left eye; OU both eyes  XT exotropia; ET esotropia; PEK punctate epithelial keratitis; PEE punctate epithelial erosions; DES dry eye syndrome; MGD meibomian gland dysfunction; ATs artificial tears; PFAT's preservative free artificial tears; [place] nuclear sclerotic cataract; PSC posterior subcapsular cataract; ERM epi-retinal membrane; PVD posterior vitreous detachment; RD retinal detachment; DM diabetes mellitus; DR diabetic retinopathy; NPDR non-proliferative diabetic retinopathy; PDR proliferative diabetic retinopathy; CSME clinically significant macular edema; DME diabetic macular edema; dbh dot blot hemorrhages; CWS cotton wool spot; POAG primary open angle glaucoma; C/D cup-to-disc ratio; HVF humphrey visual field; GVF goldmann visual field; OCT optical coherence tomography; IOP intraocular pressure; BRVO Branch retinal vein occlusion; CRVO central retinal vein occlusion; CRAO central retinal artery occlusion; BRAO branch retinal artery occlusion; RT retinal tear; SB scleral buckle; PPV pars plana vitrectomy; VH Vitreous hemorrhage; PRP panretinal laser photocoagulation; IVK intravitreal kenalog; VMT vitreomacular traction; MH Macular hole;  NVD neovascularization of  the disc; NVE neovascularization elsewhere; AREDS age related eye disease study; ARMD age related macular degeneration; POAG primary open angle glaucoma; EBMD epithelial/anterior basement membrane dystrophy; ACIOL anterior chamber intraocular lens; IOL intraocular lens; PCIOL posterior chamber intraocular lens; Phaco/IOL phacoemulsification with intraocular lens placement; [place] photorefractive keratectomy; LASIK laser assisted in situ keratomileusis; HTN hypertension; DM diabetes mellitus; COPD chronic obstructive pulmonary disease

## 2021-04-29 NOTE — Assessment & Plan Note (Signed)
Spontaneous resolution of hemorrhagic choroidal detachments as well using antivegF

## 2021-05-04 ENCOUNTER — Inpatient Hospital Stay (HOSPITAL_COMMUNITY): Payer: Medicare Other

## 2021-05-04 ENCOUNTER — Inpatient Hospital Stay (HOSPITAL_COMMUNITY): Payer: Medicare Other | Admitting: Physician Assistant

## 2021-05-11 DIAGNOSIS — R7301 Impaired fasting glucose: Secondary | ICD-10-CM | POA: Diagnosis not present

## 2021-05-11 DIAGNOSIS — E782 Mixed hyperlipidemia: Secondary | ICD-10-CM | POA: Diagnosis not present

## 2021-05-11 DIAGNOSIS — E039 Hypothyroidism, unspecified: Secondary | ICD-10-CM | POA: Diagnosis not present

## 2021-05-13 ENCOUNTER — Encounter (INDEPENDENT_AMBULATORY_CARE_PROVIDER_SITE_OTHER): Payer: Self-pay

## 2021-05-13 ENCOUNTER — Ambulatory Visit (INDEPENDENT_AMBULATORY_CARE_PROVIDER_SITE_OTHER): Payer: Medicare Other

## 2021-05-13 ENCOUNTER — Other Ambulatory Visit: Payer: Self-pay

## 2021-05-13 DIAGNOSIS — H35371 Puckering of macula, right eye: Secondary | ICD-10-CM

## 2021-05-13 MED ORDER — PREDNISOLONE ACETATE 1 % OP SUSP: 1.0000 [drp] | Freq: Four times a day (QID) | OPHTHALMIC | 0 refills | Status: AC

## 2021-05-13 MED ORDER — OFLOXACIN 0.3 % OP SOLN: 1.0000 [drp] | Freq: Four times a day (QID) | OPHTHALMIC | 0 refills | Status: AC

## 2021-05-13 MED ORDER — OFLOXACIN 0.3 % OP SOLN
1.0000 [drp] | OPHTHALMIC | 0 refills | Status: DC
Start: 2021-05-13 — End: 2021-05-13

## 2021-05-13 MED ORDER — PREDNISOLONE ACETATE 1 % OP SUSP: 1.0000 [drp] | Freq: Four times a day (QID) | OPHTHALMIC | 0 refills | Status: DC

## 2021-05-13 NOTE — Progress Notes (Signed)
05/13/2021     CHIEF COMPLAINT Patient presents for Pre-op Exam   HISTORY OF PRESENT ILLNESS: Patricia Jackson is a 81 y.o. female who presents to the clinic today for:   HPI   Pre op OD sx 05/27/2021. Pt states "in my right eye there is a grey thing in my vision that looks like it is spinning really fast. In my left eye I noticed about a week and a half ago, when I look down to the left I see a tiny black spot with white in the middle that comes and goes." Denies Flashes of Light. Last edited by Laurin Coder on 05/13/2021  2:20 PM.        HISTORICAL INFORMATION:   Selected notes from the MEDICAL RECORD NUMBER    Lab Results  Component Value Date   HGBA1C 5.6 10/30/2015     CURRENT MEDICATIONS: Current Outpatient Medications (Ophthalmic Drugs)  Medication Sig   ofloxacin (OCUFLOX) 0.3 % ophthalmic solution Place 1 drop into the right eye in the morning, at noon, in the evening, and at bedtime for 21 days.   prednisoLONE acetate (PRED FORTE) 1 % ophthalmic suspension Place 1 drop into the right eye 4 (four) times daily for 21 days.   No current facility-administered medications for this visit. (Ophthalmic Drugs)   Current Outpatient Medications (Other)  Medication Sig   acetaminophen (TYLENOL) 500 MG tablet Take 1,500 mg by mouth every 4 (four) hours as needed.   albuterol (PROVENTIL) (2.5 MG/3ML) 0.083% nebulizer solution Take 2.5 mg by nebulization every 6 (six) hours as needed for wheezing or shortness of breath.   albuterol (VENTOLIN HFA) 108 (90 Base) MCG/ACT inhaler Inhale 2 puffs into the lungs every 6 (six) hours as needed for wheezing or shortness of breath.   amLODipine-olmesartan (AZOR) 10-40 MG tablet Take 1 tablet by mouth every morning.   benzonatate (TESSALON) 100 MG capsule Take 1 capsule (100 mg total) by mouth every 8 (eight) hours.   buPROPion (WELLBUTRIN XL) 300 MG 24 hr tablet Take 300 mg by mouth daily.   Cholecalciferol (VITAMIN D3) 50 MCG (2000 UT)  TABS Take 2,000 Units by mouth daily.   denosumab (PROLIA) 60 MG/ML SOSY injection Inject 60 mg into the skin every 6 (six) months.   ELIQUIS 5 MG TABS tablet TAKE ONE TABLET (5MG  TOTAL) BY MOUTH TWOTIMES DAILY   ferrous sulfate 325 (65 FE) MG tablet Take 325 mg by mouth daily with breakfast.    fluvoxaMINE (LUVOX) 100 MG tablet Take 1 tablet (100 mg total) by mouth 2 (two) times daily.   levothyroxine (SYNTHROID) 50 MCG tablet Take 50 mcg by mouth daily before breakfast.    metoprolol succinate (TOPROL-XL) 25 MG 24 hr tablet Take 25 mg by mouth daily.   metoprolol succinate (TOPROL-XL) 50 MG 24 hr tablet Take 50 mg by mouth daily.   nitroGLYCERIN (NITROSTAT) 0.4 MG SL tablet Place 0.4 mg under the tongue every 5 (five) minutes as needed for chest pain.   predniSONE (DELTASONE) 20 MG tablet Take 2 tablets (40 mg total) by mouth daily.   rosuvastatin (CRESTOR) 20 MG tablet Take 20 mg by mouth daily.    sertraline (ZOLOFT) 25 MG tablet Take 25 mg by mouth daily.   TRELEGY ELLIPTA 200-62.5-25 MCG/INH AEPB Inhale 1 puff into the lungs daily.   No current facility-administered medications for this visit. (Other)     ALLERGIES Allergies  Allergen Reactions   Sulfa Antibiotics Hives   Sulfa Antibiotics  Hives and Other (See Comments)    Reaction not recalled by the patient, but "hives" were noted in her other profile in Epic    PAST MEDICAL HISTORY Past Medical History:  Diagnosis Date   A-fib [facility])    Atrial flutter ([place])    On Eliquis in 8/17 but discontinued after stent placement   Breast cancer ([place])    remote   CAD in native artery 03/16/2016   COPD (chronic obstructive pulmonary disease) ([place])    Hypertension    S/P angioplasty with stent 03/15/16 DES Resolute, pLCX 03/16/2016   Vitreous hemorrhage of left eye ([place]) 08/01/2019   Past Surgical History:  Procedure Laterality Date   APPENDECTOMY     BREAST SURGERY Left    CARDIAC CATHETERIZATION N/A 03/15/2016   Procedure: Left  Heart Cath and Coronary Angiography;  Surgeon: Belva Crome, MD;  Location: [place] [facility];  Service: Cardiovascular;  Laterality: N/A;   CARDIAC CATHETERIZATION N/A 03/15/2016   Procedure: Coronary Stent Intervention;  Surgeon: Belva Crome, MD;  Location: [place] CV LAB;  Service: Cardiovascular;  Laterality: N/A;   COLONOSCOPY     remote   COLONOSCOPY N/A 05/26/2016   Procedure: COLONOSCOPY;  Surgeon: Daneil Dolin, MD;  [facility];  Service: Endoscopy;  Laterality: N/A;  845   ESOPHAGOGASTRODUODENOSCOPY N/A 05/26/2016   Procedure: ESOPHAGOGASTRODUODENOSCOPY (EGD);  Surgeon: Daneil Dolin, MD;  [facility];  Service: Endoscopy;  Laterality: N/A;   MALONEY DILATION N/A 05/26/2016   Procedure: Venia Minks DILATION;  Surgeon: Daneil Dolin, MD;  [facility];  Service: Endoscopy;  Laterality: N/A;   VITRECTOMY Left 10/03/2019   Dr. Zadie Rhine, Vitrectomy, Focal Laser, Removal of Silicone Oil    FAMILY HISTORY Family History  Problem Relation Age of Onset   Heart disease Mother    COPD Father    Cancer Sister        unknown primary   Cancer Brother        unknown primary   Cancer Brother    Hypertension Son    Colon cancer Neg Hx     SOCIAL HISTORY Social History   Tobacco Use   Smoking status: Former    Types: Cigarettes    Quit date: 12/28/2014    Years since quitting: 6.3   Smokeless tobacco: Never   Tobacco comments:    Patient quit within the last 10 years  Vaping Use   Vaping Use: Never used  Substance Use Topics   Alcohol use: Never   Drug use: Never         OPHTHALMIC EXAM:  Base Eye Exam     Visual Acuity (ETDRS)       Right Left   Dist cc 20/80 20/60   Dist ph cc NI NI    Correction: Glasses         Tonometry (Tonopen, 2:23 PM)       Right Left   Pressure 9          Pupils       Pupils Dark Light Shape React APD   Right PERRL 4 3 Round  None   Left PERRL 4 4 Round Minimal None          Extraocular Movement       Right Left    Full Full         Neuro/Psych     Oriented x3: Yes   Mood/Affect: Normal  Dilation     Both eyes: No dilation @ 2:20 PM            IMAGING AND PROCEDURES  Imaging and Procedures for @TODAY @           ASSESSMENT/PLAN:  No diagnosis found.  Ophthalmic Meds Ordered this visit:  Meds ordered this encounter  Medications   DISCONTD: ofloxacin (OCUFLOX) 0.3 % ophthalmic solution    Sig: Place 1 drop into the left eye every 4 (four) hours for 10 days.    Dispense:  5 mL    Refill:  0   DISCONTD: prednisoLONE acetate (PRED FORTE) 1 % ophthalmic suspension    Sig: Place 1 drop into the right eye 4 (four) times daily.    Dispense:  10 mL    Refill:  0   ofloxacin (OCUFLOX) 0.3 % ophthalmic solution    Sig: Place 1 drop into the right eye in the morning, at noon, in the evening, and at bedtime for 21 days.    Dispense:  5 mL    Refill:  0   prednisoLONE acetate (PRED FORTE) 1 % ophthalmic suspension    Sig: Place 1 drop into the right eye 4 (four) times daily for 21 days.    Dispense:  5 mL    Refill:  0        Pre-op completed. Operative consent obtained with pre-op eye drops reviewed with Nena Jordan and sent via [facility] as needed. Post op instructions reviewed with patient and per patient all questions answered.  Laurin Coder

## 2021-05-15 DIAGNOSIS — N1832 Chronic kidney disease, stage 3b: Secondary | ICD-10-CM | POA: Diagnosis not present

## 2021-05-15 DIAGNOSIS — J449 Chronic obstructive pulmonary disease, unspecified: Secondary | ICD-10-CM | POA: Diagnosis not present

## 2021-05-15 DIAGNOSIS — E782 Mixed hyperlipidemia: Secondary | ICD-10-CM | POA: Diagnosis not present

## 2021-05-15 DIAGNOSIS — D696 Thrombocytopenia, unspecified: Secondary | ICD-10-CM | POA: Diagnosis not present

## 2021-05-15 DIAGNOSIS — I7300 Raynaud's syndrome without gangrene: Secondary | ICD-10-CM | POA: Diagnosis not present

## 2021-05-15 DIAGNOSIS — E875 Hyperkalemia: Secondary | ICD-10-CM | POA: Diagnosis not present

## 2021-05-15 DIAGNOSIS — H353290 Exudative age-related macular degeneration, unspecified eye, stage unspecified: Secondary | ICD-10-CM | POA: Diagnosis not present

## 2021-05-15 DIAGNOSIS — M810 Age-related osteoporosis without current pathological fracture: Secondary | ICD-10-CM | POA: Diagnosis not present

## 2021-05-15 DIAGNOSIS — R7301 Impaired fasting glucose: Secondary | ICD-10-CM | POA: Diagnosis not present

## 2021-05-15 DIAGNOSIS — E039 Hypothyroidism, unspecified: Secondary | ICD-10-CM | POA: Diagnosis not present

## 2021-05-15 DIAGNOSIS — I482 Chronic atrial fibrillation, unspecified: Secondary | ICD-10-CM | POA: Diagnosis not present

## 2021-05-18 ENCOUNTER — Other Ambulatory Visit (HOSPITAL_COMMUNITY): Payer: Self-pay | Admitting: Internal Medicine

## 2021-05-18 DIAGNOSIS — Z1231 Encounter for screening mammogram for malignant neoplasm of breast: Secondary | ICD-10-CM

## 2021-05-18 DIAGNOSIS — M810 Age-related osteoporosis without current pathological fracture: Secondary | ICD-10-CM

## 2021-05-19 ENCOUNTER — Inpatient Hospital Stay (HOSPITAL_COMMUNITY): Payer: Medicare Other | Attending: Hematology

## 2021-05-19 ENCOUNTER — Telehealth: Payer: Self-pay | Admitting: *Deleted

## 2021-05-19 DIAGNOSIS — D696 Thrombocytopenia, unspecified: Secondary | ICD-10-CM | POA: Insufficient documentation

## 2021-05-19 DIAGNOSIS — G479 Sleep disorder, unspecified: Secondary | ICD-10-CM | POA: Insufficient documentation

## 2021-05-19 DIAGNOSIS — J449 Chronic obstructive pulmonary disease, unspecified: Secondary | ICD-10-CM | POA: Diagnosis not present

## 2021-05-19 DIAGNOSIS — K5900 Constipation, unspecified: Secondary | ICD-10-CM | POA: Insufficient documentation

## 2021-05-19 DIAGNOSIS — E559 Vitamin D deficiency, unspecified: Secondary | ICD-10-CM | POA: Diagnosis not present

## 2021-05-19 DIAGNOSIS — Z87891 Personal history of nicotine dependence: Secondary | ICD-10-CM | POA: Diagnosis not present

## 2021-05-19 DIAGNOSIS — Z7901 Long term (current) use of anticoagulants: Secondary | ICD-10-CM | POA: Insufficient documentation

## 2021-05-19 DIAGNOSIS — I2510 Atherosclerotic heart disease of native coronary artery without angina pectoris: Secondary | ICD-10-CM | POA: Insufficient documentation

## 2021-05-19 DIAGNOSIS — R0602 Shortness of breath: Secondary | ICD-10-CM | POA: Insufficient documentation

## 2021-05-19 DIAGNOSIS — I7300 Raynaud's syndrome without gangrene: Secondary | ICD-10-CM | POA: Insufficient documentation

## 2021-05-19 DIAGNOSIS — R200 Anesthesia of skin: Secondary | ICD-10-CM | POA: Insufficient documentation

## 2021-05-19 DIAGNOSIS — D649 Anemia, unspecified: Secondary | ICD-10-CM | POA: Diagnosis not present

## 2021-05-19 DIAGNOSIS — Z8249 Family history of ischemic heart disease and other diseases of the circulatory system: Secondary | ICD-10-CM | POA: Diagnosis not present

## 2021-05-19 DIAGNOSIS — R634 Abnormal weight loss: Secondary | ICD-10-CM | POA: Diagnosis not present

## 2021-05-19 DIAGNOSIS — Z9049 Acquired absence of other specified parts of digestive tract: Secondary | ICD-10-CM | POA: Insufficient documentation

## 2021-05-19 DIAGNOSIS — Z79899 Other long term (current) drug therapy: Secondary | ICD-10-CM | POA: Diagnosis not present

## 2021-05-19 DIAGNOSIS — R42 Dizziness and giddiness: Secondary | ICD-10-CM | POA: Insufficient documentation

## 2021-05-19 DIAGNOSIS — Z882 Allergy status to sulfonamides status: Secondary | ICD-10-CM | POA: Insufficient documentation

## 2021-05-19 DIAGNOSIS — Z809 Family history of malignant neoplasm, unspecified: Secondary | ICD-10-CM | POA: Diagnosis not present

## 2021-05-19 DIAGNOSIS — R630 Anorexia: Secondary | ICD-10-CM | POA: Insufficient documentation

## 2021-05-19 DIAGNOSIS — K921 Melena: Secondary | ICD-10-CM | POA: Insufficient documentation

## 2021-05-19 DIAGNOSIS — I4891 Unspecified atrial fibrillation: Secondary | ICD-10-CM | POA: Insufficient documentation

## 2021-05-19 DIAGNOSIS — M2550 Pain in unspecified joint: Secondary | ICD-10-CM | POA: Diagnosis not present

## 2021-05-19 LAB — CBC WITH DIFFERENTIAL/PLATELET
Abs Immature Granulocytes: 0.06 10*3/uL (ref 0.00–0.07)
Basophils Absolute: 0 10*3/uL (ref 0.0–0.1)
Basophils Relative: 0 %
Eosinophils Absolute: 0.1 10*3/uL (ref 0.0–0.5)
Eosinophils Relative: 1 %
HCT: 28.2 % — ABNORMAL LOW (ref 36.0–46.0)
Hemoglobin: 8 g/dL — ABNORMAL LOW (ref 12.0–15.0)
Immature Granulocytes: 1 %
Lymphocytes Relative: 7 %
Lymphs Abs: 0.5 10*3/uL — ABNORMAL LOW (ref 0.7–4.0)
MCH: 28.8 pg (ref 26.0–34.0)
MCHC: 28.4 g/dL — ABNORMAL LOW (ref 30.0–36.0)
MCV: 101.4 fL — ABNORMAL HIGH (ref 80.0–100.0)
Monocytes Absolute: 0.6 10*3/uL (ref 0.1–1.0)
Monocytes Relative: 7 %
Neutro Abs: 7 10*3/uL (ref 1.7–7.7)
Neutrophils Relative %: 84 %
Platelets: 132 10*3/uL — ABNORMAL LOW (ref 150–400)
RBC: 2.78 MIL/uL — ABNORMAL LOW (ref 3.87–5.11)
RDW: 16.1 % — ABNORMAL HIGH (ref 11.5–15.5)
WBC: 8.2 10*3/uL (ref 4.0–10.5)
nRBC: 0 % (ref 0.0–0.2)

## 2021-05-19 LAB — LACTATE DEHYDROGENASE: LDH: 125 U/L (ref 98–192)

## 2021-05-19 LAB — COMPREHENSIVE METABOLIC PANEL
ALT: 18 U/L (ref 0–44)
AST: 31 U/L (ref 15–41)
Albumin: 3.5 g/dL (ref 3.5–5.0)
Alkaline Phosphatase: 50 U/L (ref 38–126)
Anion gap: 9 (ref 5–15)
BUN: 30 mg/dL — ABNORMAL HIGH (ref 8–23)
CO2: 17 mmol/L — ABNORMAL LOW (ref 22–32)
Calcium: 8.9 mg/dL (ref 8.9–10.3)
Chloride: 113 mmol/L — ABNORMAL HIGH (ref 98–111)
Creatinine, Ser: 1.2 mg/dL — ABNORMAL HIGH (ref 0.44–1.00)
GFR, Estimated: 46 mL/min — ABNORMAL LOW (ref >60)
Glucose, Bld: 123 mg/dL — ABNORMAL HIGH (ref 70–99)
Potassium: 4.1 mmol/L (ref 3.5–5.1)
Sodium: 139 mmol/L (ref 135–145)
Total Bilirubin: 0.4 mg/dL (ref 0.3–1.2)
Total Protein: 5.8 g/dL — ABNORMAL LOW (ref 6.5–8.1)

## 2021-05-19 LAB — VITAMIN D 25 HYDROXY (VIT D DEFICIENCY, FRACTURES): Vit D, 25-Hydroxy: 58.59 ng/mL (ref 30–100)

## 2021-05-19 NOTE — Telephone Encounter (Signed)
Labs received from:Dr. Wende Neighbors  Drawn on:05/12/2021  Reviewed by:Dr. Bo Merino   Labs drawn:Hgb A1C, Albumin/Creatinine Ratio Lipid Panel, CMP, CBC, TSH  Results:Glucose 100   Creat. 1.38   GFR 39   Sodium 145   Chloride 116   CO2 17   RBC 3.16   Hgb 9.2   Hct 30.3   MCHC 30.4    Platelets 123  We do not have patient on any medications at this time.

## 2021-05-25 NOTE — Progress Notes (Signed)
[address], [place] [address]   CLINIC:  Medical Oncology/Hematology  PCP:  Celene Squibb, MD [place] [address] [phone]   REASON FOR VISIT:  Follow-up for thrombocytopenia and work-up of new anemia  PRIOR THERAPY: None  CURRENT THERAPY: Observation  INTERVAL HISTORY:  Patricia Jackson 81 y.o. female returns for routine follow-up of thrombocytopenia, as well as for work-up of new anemia.  She was last seen by Tarri Abernethy PA-C on 10/27/2020.  At today's visit, she reports feeling fair.  No recent hospitalizations, surgeries, or changes in baseline health status.  Regarding her thrombocytopenia, she continues to have easy bruising but denies any petechial rash, fever, chills, or night sweats.  Regarding her new anemia, she denies any obvious signs of blood loss such as epistaxis, hematemesis, hematochezia, melena, or hematuria.  She has intermittent dark stool (hard and formed), which she attributes to taking her iron pill.  She does continue to take Eliquis for atrial fibrillation.  She did have a previous episode of severe anemia in January 2018, and required PRBC x2.  She states that she does not feel as bad as she did at that time, but has noticed some increasing fatigue, lightheadedness, and weakness.  She has not had any syncopal episodes, palpitations, chest pain, pica, restless legs, or headaches.  She has chronic dyspnea on exertion from her COPD.  She has 50% energy. She does admit to a poor appetite, eats 1-2 meals daily.  She has lost about 10 pounds in the past 6 months.   REVIEW OF SYSTEMS:  Review of Systems  Constitutional:  Positive for appetite change, fatigue and unexpected weight change. Negative for chills, diaphoresis and fever.  HENT:   Negative for lump/mass and nosebleeds.   Eyes:  Negative for eye problems.  Respiratory:  Positive for shortness of breath (with exertion; COPD). Negative for  cough and hemoptysis.   Cardiovascular:  Negative for chest pain, leg swelling and palpitations.  Gastrointestinal:  Positive for constipation. Negative for abdominal pain, blood in stool, diarrhea, nausea and vomiting.  Genitourinary:  Negative for hematuria.   Musculoskeletal:  Positive for arthralgias and back pain.  Skin: Negative.   Neurological:  Positive for light-headedness and numbness. Negative for dizziness and headaches.  Hematological:  Does not bruise/bleed easily.  Psychiatric/Behavioral:  Positive for sleep disturbance.      PAST MEDICAL/SURGICAL HISTORY:  Past Medical History:  Diagnosis Date   A-fib [facility])    Atrial flutter ([facility])    On Eliquis in 8/17 but discontinued after stent placement   Breast cancer ([place])    remote   CAD in native artery 03/16/2016   COPD (chronic obstructive pulmonary disease) ([place])    Hypertension    S/P angioplasty with stent 03/15/16 DES Resolute, pLCX 03/16/2016   Vitreous hemorrhage of left eye ([place]) 08/01/2019   Past Surgical History:  Procedure Laterality Date   APPENDECTOMY     BREAST SURGERY Left    CARDIAC CATHETERIZATION N/A 03/15/2016   Procedure: Left Heart Cath and Coronary Angiography;  Surgeon: Belva Crome, MD;  Location: [facility];  Service: Cardiovascular;  Laterality: N/A;   CARDIAC CATHETERIZATION N/A 03/15/2016   Procedure: Coronary Stent Intervention;  Surgeon: Belva Crome, MD;  Location: [place] CV LAB;  Service: Cardiovascular;  Laterality: N/A;   COLONOSCOPY     remote   COLONOSCOPY N/A 05/26/2016   Procedure: COLONOSCOPY;  Surgeon: Cristopher Estimable  Rourk, MD;  [facility];  Service: Endoscopy;  Laterality: N/A;  845   ESOPHAGOGASTRODUODENOSCOPY N/A 05/26/2016   Procedure: ESOPHAGOGASTRODUODENOSCOPY (EGD);  Surgeon: Daneil Dolin, MD;  [facility];  Service: Endoscopy;  Laterality: N/A;   MALONEY DILATION N/A 05/26/2016   Procedure: Venia Minks DILATION;  Surgeon: Daneil Dolin, MD;   [facility];  Service: Endoscopy;  Laterality: N/A;   VITRECTOMY Left 10/03/2019   Dr. Zadie Rhine, Vitrectomy, Focal Laser, Removal of Silicone Oil     SOCIAL HISTORY:  Social History   Socioeconomic History   Marital status: Divorced    Spouse name: Not on file   Number of children: 1   Years of education: Not on file   Highest education level: Not on file  Occupational History   Occupation: Retired  Tobacco Use   Smoking status: Former    Types: Cigarettes    Quit date: 12/28/2014    Years since quitting: 6.4   Smokeless tobacco: Never   Tobacco comments:    Patient quit within the last 10 years  Vaping Use   Vaping Use: Never used  Substance and Sexual Activity   Alcohol use: Never   Drug use: Never   Sexual activity: Not Currently  Other Topics Concern   Not on file  Social History Narrative   ** Merged History Encounter **       Social Determinants of Health   Financial Resource Strain: Not on file  Food Insecurity: Not on file  Transportation Needs: Not on file  Physical Activity: Not on file  Stress: Not on file  Social Connections: Not on file  Intimate Partner Violence: Not on file    FAMILY HISTORY:  Family History  Problem Relation Age of Onset   Heart disease Mother    COPD Father    Cancer Sister        unknown primary   Cancer Brother        unknown primary   Cancer Brother    Hypertension Son    Colon cancer Neg Hx     CURRENT MEDICATIONS:  Outpatient Encounter Medications as of 05/26/2021  Medication Sig   acetaminophen (TYLENOL) 500 MG tablet Take 1,500 mg by mouth every 4 (four) hours as needed.   albuterol (PROVENTIL) (2.5 MG/3ML) 0.083% nebulizer solution Take 2.5 mg by nebulization every 6 (six) hours as needed for wheezing or shortness of breath.   albuterol (VENTOLIN HFA) 108 (90 Base) MCG/ACT inhaler Inhale 2 puffs into the lungs every 6 (six) hours as needed for wheezing or shortness of breath.   amLODipine-olmesartan  (AZOR) 10-40 MG tablet Take 1 tablet by mouth every morning.   benzonatate (TESSALON) 100 MG capsule Take 1 capsule (100 mg total) by mouth every 8 (eight) hours.   buPROPion (WELLBUTRIN XL) 300 MG 24 hr tablet Take 300 mg by mouth daily.   Cholecalciferol (VITAMIN D3) 50 MCG (2000 UT) TABS Take 2,000 Units by mouth daily.   denosumab (PROLIA) 60 MG/ML SOSY injection Inject 60 mg into the skin every 6 (six) months.   ELIQUIS 5 MG TABS tablet TAKE ONE TABLET (5MG TOTAL) BY MOUTH TWOTIMES DAILY   ferrous sulfate 325 (65 FE) MG tablet Take 325 mg by mouth daily with breakfast.    fluvoxaMINE (LUVOX) 100 MG tablet Take 1 tablet (100 mg total) by mouth 2 (two) times daily.   levothyroxine (SYNTHROID) 50 MCG tablet Take 50 mcg by mouth daily before breakfast.  metoprolol succinate (TOPROL-XL) 25 MG 24 hr tablet Take 25 mg by mouth daily.   metoprolol succinate (TOPROL-XL) 50 MG 24 hr tablet Take 50 mg by mouth daily.   nitroGLYCERIN (NITROSTAT) 0.4 MG SL tablet Place 0.4 mg under the tongue every 5 (five) minutes as needed for chest pain.   ofloxacin (OCUFLOX) 0.3 % ophthalmic solution Place 1 drop into the right eye in the morning, at noon, in the evening, and at bedtime for 21 days.   prednisoLONE acetate (PRED FORTE) 1 % ophthalmic suspension Place 1 drop into the right eye 4 (four) times daily for 21 days.   predniSONE (DELTASONE) 20 MG tablet Take 2 tablets (40 mg total) by mouth daily.   rosuvastatin (CRESTOR) 20 MG tablet Take 20 mg by mouth daily.    sertraline (ZOLOFT) 25 MG tablet Take 25 mg by mouth daily.   TRELEGY ELLIPTA 200-62.5-25 MCG/INH AEPB Inhale 1 puff into the lungs daily.   No facility-administered encounter medications on file as of 05/26/2021.    ALLERGIES:  Allergies  Allergen Reactions   Sulfa Antibiotics Hives   Sulfa Antibiotics Hives and Other (See Comments)    Reaction not recalled by the patient, but "hives" were noted in her other profile in Epic      PHYSICAL EXAM:  ECOG PERFORMANCE STATUS: 1 - Symptomatic but completely ambulatory  There were no vitals filed for this visit. There were no vitals filed for this visit. Physical Exam Constitutional:      Appearance: Normal appearance.  HENT:     Head: Normocephalic and atraumatic.     Mouth/Throat:     Mouth: Mucous membranes are moist.  Eyes:     Extraocular Movements: Extraocular movements intact.     Pupils: Pupils are equal, round, and reactive to light.  Cardiovascular:     Rate and Rhythm: Regular rhythm. Bradycardia present.     Pulses: Normal pulses.     Heart sounds: Normal heart sounds.  Pulmonary:     Effort: Pulmonary effort is normal.     Breath sounds: Normal breath sounds. Decreased air movement present.  Abdominal:     General: Bowel sounds are normal.     Palpations: Abdomen is soft.     Tenderness: There is no abdominal tenderness.  Musculoskeletal:        General: No swelling.     Right lower leg: No edema.     Left lower leg: No edema.  Lymphadenopathy:     Cervical: No cervical adenopathy.  Skin:    General: Skin is warm and dry.     Findings: Bruising present.  Neurological:     General: No focal deficit present.     Mental Status: She is alert and oriented to person, place, and time.  Psychiatric:        Mood and Affect: Mood normal.        Behavior: Behavior normal.     LABORATORY DATA:  I have reviewed the labs as listed.  CBC    Component Value Date/Time   WBC 8.2 05/19/2021 0848   RBC 2.78 (L) 05/19/2021 0848   HGB 8.0 (L) 05/19/2021 0848   HCT 28.2 (L) 05/19/2021 0848   PLT 132 (L) 05/19/2021 0848   MCV 101.4 (H) 05/19/2021 0848   MCH 28.8 05/19/2021 0848   MCHC 28.4 (L) 05/19/2021 0848   RDW 16.1 (H) 05/19/2021 0848   LYMPHSABS 0.5 (L) 05/19/2021 0848   MONOABS 0.6 05/19/2021 0848   EOSABS 0.1  05/19/2021 0848   BASOSABS 0.0 05/19/2021 0848   CMP Latest Ref Rng & Units 05/19/2021 07/06/2019 07/04/2019  Glucose 70 - 99  mg/dL 123(H) 104(H) 94  BUN 8 - 23 mg/dL 30(H) 26(H) 33(H)  Creatinine 0.44 - 1.00 mg/dL 1.20(H) 1.53(H) 1.84(H)  Sodium 135 - 145 mmol/L 139 141 139  Potassium 3.5 - 5.1 mmol/L 4.1 4.5 4.4  Chloride 98 - 111 mmol/L 113(H) 114(H) 109  CO2 22 - 32 mmol/L 17(L) 22 20(L)  Calcium 8.9 - 10.3 mg/dL 8.9 9.1 8.9  Total Protein 6.5 - 8.1 g/dL 5.8(L) - 6.8  Total Bilirubin 0.3 - 1.2 mg/dL 0.4 - 0.5  Alkaline Phos 38 - 126 U/L 50 - 66  AST 15 - 41 U/L 31 - 40  ALT 0 - 44 U/L 18 - 27    DIAGNOSTIC IMAGING:  I have independently reviewed the relevant imaging and discussed with the patient.  ASSESSMENT & PLAN: 1.  Anemia, normocytic/mildly macrocytic - Onset of anemia noted on 05/19/2021, with Hgb 8.0/MCV 101.4.  Prior to this, she had Hgb 12.1 in July 2022. - Labs sent by PCP (05/11/2021): Hgb 9.2/MCV 96.  Creatinine was at baseline 1.38. - Record review does show severe anemia in December 2017/January 2018 with Hgb 6.5, which was due to iron deficiency of unknown source.  She received PRBC x2. - EGD (05/26/2016): LA grade A esophagitis, small hiatal hernia - Colonoscopy (05/26/2016): Diverticulosis and polyps - She was Hemoccult negative in January 2018 - She takes Eliquis for atrial fibrillation  - She denies any bright red blood per rectum or melena - She is symptomatic with increasing fatigue and episodes of lightheadedness - Wide differential at this time includes acute blood loss anemia, hemolytic anemia, anemia of CKD, or bone marrow disorder. - PLAN: Labs today to investigate acute onset of anemia. Stool cards x3 Repeat CBC, pathology smear review CMP, reticulocytes, LDH, haptoglobin, DAT Nutritional panel (B12/MMA, folate, copper) Myeloma panel (SPEP, IFE, light chains) - RTC in 2 weeks to discuss results and next steps. - If above work-up is negative, would consider bone marrow biopsy to rule out MDS in the setting of mild macrocytosis and thrombocytopenia  2.  Thrombocytopenia, mild  to moderate - Seen at the request of Dr. Nevada Crane for platelet count of 62.  She has some easy bruising. - She is on Eliquis for atrial fibrillation. - Hematologic work-up showed SPEP negative.  B12, methylmalonic acid, copper and folic acid levels were normal.  LDH was normal.  White count and platelet count was normal. - ANA was positive with a dilution of 1 and 1280.  (She has positive Raynaud's phenomena.  Evaluated by rheumatology.)  Rheumatoid factor is negative.  Hepatitis panel was negative. - She admits to easy bruising but denies any signs or symptoms of blood loss  - Most recent labs (05/19/2021): Platelets 132 (stable at baseline), mild lymphopenia with lymphocyte 0.5.  Normal LDH. - Differential diagnosis favors ITP versus early MDS - PLAN: RTC in 6 months for repeat labs and monitoring.  Will consider bone marrow biopsy if any significant deviations from baseline or if patient develops B symptoms.  3.  Vitamin D deficiency - Patient was started on vitamin D 1000 units daily at her last visit - Most recent labs (05/19/2021): Vitamin D normal at 58.59 - PLAN: Continue vitamin D supplementation  4.  Tobacco abuse - Smokes 2 PPD x60 years - CT chest lung cancer screening in November 2022 was lung RADS 2,  benign - PLAN: Patient will continue her annual LDCT scan via NP Mayer: Labs today Stool cards x3 Office visit in 2 weeks to discuss results  All questions were answered. The patient knows to call the clinic with any problems, questions or concerns.  Medical decision making: Moderate  Time spent on visit: I spent 20 minutes counseling the patient face to face. The total time spent in the appointment was 30 minutes and more than 50% was on counseling.   Harriett Rush, PA-C  05/26/2021 10:48 AM

## 2021-05-26 ENCOUNTER — Inpatient Hospital Stay (HOSPITAL_BASED_OUTPATIENT_CLINIC_OR_DEPARTMENT_OTHER): Payer: Medicare Other | Admitting: Physician Assistant

## 2021-05-26 ENCOUNTER — Inpatient Hospital Stay (HOSPITAL_COMMUNITY): Payer: Medicare Other

## 2021-05-26 ENCOUNTER — Other Ambulatory Visit: Payer: Self-pay

## 2021-05-26 VITALS — BP 108/48 | HR 56 | Temp 96.8°F | Resp 20 | Ht 68.9 in | Wt 136.0 lb

## 2021-05-26 DIAGNOSIS — E559 Vitamin D deficiency, unspecified: Secondary | ICD-10-CM | POA: Diagnosis not present

## 2021-05-26 DIAGNOSIS — R42 Dizziness and giddiness: Secondary | ICD-10-CM | POA: Diagnosis not present

## 2021-05-26 DIAGNOSIS — K5900 Constipation, unspecified: Secondary | ICD-10-CM | POA: Diagnosis not present

## 2021-05-26 DIAGNOSIS — D649 Anemia, unspecified: Secondary | ICD-10-CM | POA: Diagnosis not present

## 2021-05-26 DIAGNOSIS — Z809 Family history of malignant neoplasm, unspecified: Secondary | ICD-10-CM | POA: Diagnosis not present

## 2021-05-26 DIAGNOSIS — I7300 Raynaud's syndrome without gangrene: Secondary | ICD-10-CM | POA: Diagnosis not present

## 2021-05-26 DIAGNOSIS — D696 Thrombocytopenia, unspecified: Secondary | ICD-10-CM | POA: Diagnosis not present

## 2021-05-26 DIAGNOSIS — I4891 Unspecified atrial fibrillation: Secondary | ICD-10-CM | POA: Diagnosis not present

## 2021-05-26 DIAGNOSIS — Z8249 Family history of ischemic heart disease and other diseases of the circulatory system: Secondary | ICD-10-CM | POA: Diagnosis not present

## 2021-05-26 DIAGNOSIS — Z9049 Acquired absence of other specified parts of digestive tract: Secondary | ICD-10-CM | POA: Diagnosis not present

## 2021-05-26 DIAGNOSIS — J449 Chronic obstructive pulmonary disease, unspecified: Secondary | ICD-10-CM | POA: Diagnosis not present

## 2021-05-26 DIAGNOSIS — R634 Abnormal weight loss: Secondary | ICD-10-CM | POA: Diagnosis not present

## 2021-05-26 DIAGNOSIS — Z87891 Personal history of nicotine dependence: Secondary | ICD-10-CM | POA: Diagnosis not present

## 2021-05-26 DIAGNOSIS — Z7901 Long term (current) use of anticoagulants: Secondary | ICD-10-CM | POA: Diagnosis not present

## 2021-05-26 DIAGNOSIS — I2510 Atherosclerotic heart disease of native coronary artery without angina pectoris: Secondary | ICD-10-CM | POA: Diagnosis not present

## 2021-05-26 DIAGNOSIS — G479 Sleep disorder, unspecified: Secondary | ICD-10-CM | POA: Diagnosis not present

## 2021-05-26 DIAGNOSIS — Z79899 Other long term (current) drug therapy: Secondary | ICD-10-CM | POA: Diagnosis not present

## 2021-05-26 DIAGNOSIS — R0602 Shortness of breath: Secondary | ICD-10-CM | POA: Diagnosis not present

## 2021-05-26 DIAGNOSIS — K921 Melena: Secondary | ICD-10-CM | POA: Diagnosis not present

## 2021-05-26 DIAGNOSIS — R200 Anesthesia of skin: Secondary | ICD-10-CM | POA: Diagnosis not present

## 2021-05-26 DIAGNOSIS — Z882 Allergy status to sulfonamides status: Secondary | ICD-10-CM | POA: Diagnosis not present

## 2021-05-26 DIAGNOSIS — M2550 Pain in unspecified joint: Secondary | ICD-10-CM | POA: Diagnosis not present

## 2021-05-26 LAB — COMPREHENSIVE METABOLIC PANEL
ALT: 26 U/L (ref 0–44)
AST: 41 U/L (ref 15–41)
Albumin: 3.7 g/dL (ref 3.5–5.0)
Alkaline Phosphatase: 53 U/L (ref 38–126)
Anion gap: 7 (ref 5–15)
BUN: 25 mg/dL — ABNORMAL HIGH (ref 8–23)
CO2: 18 mmol/L — ABNORMAL LOW (ref 22–32)
Calcium: 8.9 mg/dL (ref 8.9–10.3)
Chloride: 112 mmol/L — ABNORMAL HIGH (ref 98–111)
Creatinine, Ser: 1.5 mg/dL — ABNORMAL HIGH (ref 0.44–1.00)
GFR, Estimated: 35 mL/min — ABNORMAL LOW (ref >60)
Glucose, Bld: 101 mg/dL — ABNORMAL HIGH (ref 70–99)
Potassium: 4.4 mmol/L (ref 3.5–5.1)
Sodium: 137 mmol/L (ref 135–145)
Total Bilirubin: 0.6 mg/dL (ref 0.3–1.2)
Total Protein: 6.1 g/dL — ABNORMAL LOW (ref 6.5–8.1)

## 2021-05-26 LAB — CBC WITH DIFFERENTIAL/PLATELET
Abs Immature Granulocytes: 0.02 10*3/uL (ref 0.00–0.07)
Basophils Absolute: 0 10*3/uL (ref 0.0–0.1)
Basophils Relative: 1 %
Eosinophils Absolute: 0.1 10*3/uL (ref 0.0–0.5)
Eosinophils Relative: 2 %
HCT: 27.4 % — ABNORMAL LOW (ref 36.0–46.0)
Hemoglobin: 7.7 g/dL — ABNORMAL LOW (ref 12.0–15.0)
Immature Granulocytes: 0 %
Lymphocytes Relative: 16 %
Lymphs Abs: 0.9 10*3/uL (ref 0.7–4.0)
MCH: 28.6 pg (ref 26.0–34.0)
MCHC: 28.1 g/dL — ABNORMAL LOW (ref 30.0–36.0)
MCV: 101.9 fL — ABNORMAL HIGH (ref 80.0–100.0)
Monocytes Absolute: 0.4 10*3/uL (ref 0.1–1.0)
Monocytes Relative: 8 %
Neutro Abs: 4.2 10*3/uL (ref 1.7–7.7)
Neutrophils Relative %: 73 %
Platelets: 185 10*3/uL (ref 150–400)
RBC: 2.69 MIL/uL — ABNORMAL LOW (ref 3.87–5.11)
RDW: 15.9 % — ABNORMAL HIGH (ref 11.5–15.5)
WBC: 5.6 10*3/uL (ref 4.0–10.5)
nRBC: 0 % (ref 0.0–0.2)

## 2021-05-26 LAB — IRON AND TIBC
Iron: 36 ug/dL (ref 28–170)
Saturation Ratios: 10 % — ABNORMAL LOW (ref 10.4–31.8)
TIBC: 375 ug/dL (ref 250–450)
UIBC: 339 ug/dL

## 2021-05-26 LAB — VITAMIN B12: Vitamin B-12: 1722 pg/mL — ABNORMAL HIGH (ref 180–914)

## 2021-05-26 LAB — RETICULOCYTES
Immature Retic Fract: 31.3 % — ABNORMAL HIGH (ref 2.3–15.9)
RBC.: 2.63 MIL/uL — ABNORMAL LOW (ref 3.87–5.11)
Retic Count, Absolute: 71 10*3/uL (ref 19.0–186.0)
Retic Ct Pct: 2.7 % (ref 0.4–3.1)

## 2021-05-26 LAB — DIRECT ANTIGLOBULIN TEST (NOT AT ARMC)
DAT, IgG: NEGATIVE
DAT, complement: NEGATIVE

## 2021-05-26 LAB — LACTATE DEHYDROGENASE: LDH: 128 U/L (ref 98–192)

## 2021-05-26 LAB — FERRITIN: Ferritin: 62 ng/mL (ref 11–307)

## 2021-05-26 LAB — FOLATE: Folate: 13.6 ng/mL (ref >5.9)

## 2021-05-26 NOTE — Patient Instructions (Addendum)
[place] at [facility] Discharge Instructions  You were seen today by Tarri Abernethy PA-C for your low platelets and your anemia.  Your platelets are stable, but your blood count has dropped significantly over the past 6 months.  We will check labs today to find out why your blood is low.  We will also check stool cards x 3.   We will see you for follow up in 2 weeks to discuss results.    Thank you for choosing [place] at [facility] to provide your oncology and hematology care.  To afford each patient quality time with our provider, please arrive at least 15 minutes before your scheduled appointment time.   If you have a lab appointment with the [facility] please come in thru the Main Entrance and check in at the main information desk.  You need to re-schedule your appointment should you arrive 10 or more minutes late.  We strive to give you quality time with our providers, and arriving late affects you and other patients whose appointments are after yours.  Also, if you no show three or more times for appointments you may be dismissed from the clinic at the providers discretion.     Again, thank you for choosing [facility].  Our hope is that these requests will decrease the amount of time that you wait before being seen by our physicians.       _____________________________________________________________  Should you have questions after your visit to [facility], please contact our office at [phone] and follow the prompts.  Our office hours are 8:00 a.m. and 4:30 p.m. Monday - Friday.  Please note that voicemails left after 4:00 p.m. may not be returned until the following business day.  We are closed weekends and major holidays.  You do have access to a nurse 24-7, just call the main number to the clinic [phone] and do not press any options, hold on the line and a nurse will answer the phone.     For prescription refill requests, have your pharmacy contact our office and allow 72 hours.    Due to Covid, you will need to wear a mask upon entering the hospital. If you do not have a mask, a mask will be given to you at the Main Entrance upon arrival. For doctor visits, patients may have 1 support person age 58 or older with them. For treatment visits, patients can not have anyone with them due to social distancing guidelines and our immunocompromised population.

## 2021-05-27 ENCOUNTER — Encounter (AMBULATORY_SURGERY_CENTER): Payer: Medicare Other | Admitting: Ophthalmology

## 2021-05-27 DIAGNOSIS — H35371 Puckering of macula, right eye: Secondary | ICD-10-CM

## 2021-05-27 LAB — PATHOLOGIST SMEAR REVIEW

## 2021-05-27 LAB — KAPPA/LAMBDA LIGHT CHAINS
Kappa free light chain: 45.7 mg/L — ABNORMAL HIGH (ref 3.3–19.4)
Kappa, lambda light chain ratio: 1.62 (ref 0.26–1.65)
Lambda free light chains: 28.2 mg/L — ABNORMAL HIGH (ref 5.7–26.3)

## 2021-05-27 LAB — HAPTOGLOBIN: Haptoglobin: 119 mg/dL (ref 42–346)

## 2021-05-28 ENCOUNTER — Encounter (INDEPENDENT_AMBULATORY_CARE_PROVIDER_SITE_OTHER): Payer: Medicare Other | Admitting: Ophthalmology

## 2021-05-28 ENCOUNTER — Encounter (INDEPENDENT_AMBULATORY_CARE_PROVIDER_SITE_OTHER): Payer: Self-pay | Admitting: Ophthalmology

## 2021-05-28 ENCOUNTER — Ambulatory Visit (INDEPENDENT_AMBULATORY_CARE_PROVIDER_SITE_OTHER): Payer: Medicare Other | Admitting: Ophthalmology

## 2021-05-28 ENCOUNTER — Other Ambulatory Visit: Payer: Self-pay

## 2021-05-28 DIAGNOSIS — H353211 Exudative age-related macular degeneration, right eye, with active choroidal neovascularization: Secondary | ICD-10-CM

## 2021-05-28 DIAGNOSIS — H35371 Puckering of macula, right eye: Secondary | ICD-10-CM

## 2021-05-28 DIAGNOSIS — H3561 Retinal hemorrhage, right eye: Secondary | ICD-10-CM

## 2021-05-28 LAB — PROTEIN ELECTROPHORESIS, SERUM
A/G Ratio: 1.4 (ref 0.7–1.7)
Albumin ELP: 3.4 g/dL (ref 2.9–4.4)
Alpha-1-Globulin: 0.3 g/dL (ref 0.0–0.4)
Alpha-2-Globulin: 0.8 g/dL (ref 0.4–1.0)
Beta Globulin: 0.9 g/dL (ref 0.7–1.3)
Gamma Globulin: 0.6 g/dL (ref 0.4–1.8)
Globulin, Total: 2.5 g/dL (ref 2.2–3.9)
Total Protein ELP: 5.9 g/dL — ABNORMAL LOW (ref 6.0–8.5)

## 2021-05-28 LAB — IMMUNOFIXATION ELECTROPHORESIS
IgA: 82 mg/dL (ref 64–422)
IgG (Immunoglobin G), Serum: 569 mg/dL — ABNORMAL LOW (ref 586–1602)
IgM (Immunoglobulin M), Srm: 119 mg/dL (ref 26–217)
Total Protein ELP: 5.9 g/dL — ABNORMAL LOW (ref 6.0–8.5)

## 2021-05-28 LAB — COPPER, SERUM: Copper: 13 ug/dL — ABNORMAL LOW (ref 80–158)

## 2021-05-28 NOTE — Assessment & Plan Note (Signed)
No active hemorrhages

## 2021-05-28 NOTE — Assessment & Plan Note (Signed)
No active progression of disease at time of vitrectomy

## 2021-05-28 NOTE — Patient Instructions (Signed)

## 2021-05-28 NOTE — Assessment & Plan Note (Signed)
Postop day #1 acuity improved to 20/60,

## 2021-05-28 NOTE — Progress Notes (Signed)
05/28/2021     CHIEF COMPLAINT Patient presents for  Chief Complaint  Patient presents with   Post-op Follow-up      HISTORY OF PRESENT ILLNESS: Patricia Jackson is a 81 y.o. female who presents to the clinic today for:   HPI     Post-op Follow-up           Laterality: right eye   Discomfort: itching.  Negative for pain, foreign body sensation, tearing, discharge and floaters   Vision: is stable         Comments   1 Day PO OD SX 05/27/21.  Vitrectomy with membrane peel right eye, ILM Pt states vision is about the same, slight pressure but not pain, and her eye itches. Pt states color is a little lighter. Pt is starting prednisolone and ofloxacin QID OD.      Last edited by Hurman Horn, MD on 05/28/2021  8:35 AM.      Referring physician: Celene Squibb, MD [place],  [place] [address]  HISTORICAL INFORMATION:   Selected notes from the MEDICAL RECORD NUMBER    Lab Results  Component Value Date   HGBA1C 5.6 10/30/2015     CURRENT MEDICATIONS: Current Outpatient Medications (Ophthalmic Drugs)  Medication Sig   ofloxacin (OCUFLOX) 0.3 % ophthalmic solution Place 1 drop into the right eye in the morning, at noon, in the evening, and at bedtime for 21 days.   prednisoLONE acetate (PRED FORTE) 1 % ophthalmic suspension Place 1 drop into the right eye 4 (four) times daily for 21 days.   No current facility-administered medications for this visit. (Ophthalmic Drugs)   Current Outpatient Medications (Other)  Medication Sig   acetaminophen (TYLENOL) 500 MG tablet Take 1,500 mg by mouth every 4 (four) hours as needed.   albuterol (PROVENTIL) (2.5 MG/3ML) 0.083% nebulizer solution Take 2.5 mg by nebulization every 6 (six) hours as needed for wheezing or shortness of breath.   albuterol (VENTOLIN HFA) 108 (90 Base) MCG/ACT inhaler Inhale 2 puffs into the lungs every 6 (six) hours as needed for wheezing or shortness of breath.   amLODipine-olmesartan  (AZOR) 10-40 MG tablet Take 1 tablet by mouth every morning.   benzonatate (TESSALON) 100 MG capsule Take 1 capsule (100 mg total) by mouth every 8 (eight) hours.   buPROPion (WELLBUTRIN XL) 300 MG 24 hr tablet Take 300 mg by mouth daily.   Cholecalciferol (VITAMIN D3) 50 MCG (2000 UT) TABS Take 2,000 Units by mouth daily.   denosumab (PROLIA) 60 MG/ML SOSY injection Inject 60 mg into the skin every 6 (six) months.   ELIQUIS 5 MG TABS tablet TAKE ONE TABLET (5MG  TOTAL) BY MOUTH TWOTIMES DAILY   ferrous sulfate 325 (65 FE) MG tablet Take 325 mg by mouth daily with breakfast.    fluvoxaMINE (LUVOX) 100 MG tablet Take 1 tablet (100 mg total) by mouth 2 (two) times daily.   levothyroxine (SYNTHROID) 50 MCG tablet Take 50 mcg by mouth daily before breakfast.    metoprolol succinate (TOPROL-XL) 25 MG 24 hr tablet Take 25 mg by mouth daily.   metoprolol succinate (TOPROL-XL) 50 MG 24 hr tablet Take 50 mg by mouth daily.   nitroGLYCERIN (NITROSTAT) 0.4 MG SL tablet Place 0.4 mg under the tongue every 5 (five) minutes as needed for chest pain.   predniSONE (DELTASONE) 20 MG tablet Take 2 tablets (40 mg total) by mouth daily.   rosuvastatin (CRESTOR) 20 MG tablet Take 20 mg  by mouth daily.    sertraline (ZOLOFT) 25 MG tablet Take 25 mg by mouth daily.   TRELEGY ELLIPTA 200-62.5-25 MCG/INH AEPB Inhale 1 puff into the lungs daily.   No current facility-administered medications for this visit. (Other)      REVIEW OF SYSTEMS: ROS   Negative for: Constitutional, Gastrointestinal, Neurological, Skin, Genitourinary, Musculoskeletal, HENT, Endocrine, Cardiovascular, Eyes, Respiratory, Psychiatric, Allergic/Imm, Heme/Lymph Last edited by Hurman Horn, MD on 05/28/2021  8:35 AM.       ALLERGIES Allergies  Allergen Reactions   Sulfa Antibiotics Hives   Sulfa Antibiotics Hives and Other (See Comments)    Reaction not recalled by the patient, but "hives" were noted in her other profile in Epic     PAST MEDICAL HISTORY Past Medical History:  Diagnosis Date   A-fib [facility])    Atrial flutter ([place])    On Eliquis in 8/17 but discontinued after stent placement   Breast cancer ([place])    remote   CAD in native artery 03/16/2016   COPD (chronic obstructive pulmonary disease) ([place])    Hypertension    S/P angioplasty with stent 03/15/16 DES Resolute, pLCX 03/16/2016   Vitreous hemorrhage of left eye ([place]) 08/01/2019   Past Surgical History:  Procedure Laterality Date   APPENDECTOMY     BREAST SURGERY Left    CARDIAC CATHETERIZATION N/A 03/15/2016   Procedure: Left Heart Cath and Coronary Angiography;  Surgeon: Belva Crome, MD;  Location: [place] [facility];  Service: Cardiovascular;  Laterality: N/A;   CARDIAC CATHETERIZATION N/A 03/15/2016   Procedure: Coronary Stent Intervention;  Surgeon: Belva Crome, MD;  Location: [place] [facility];  Service: Cardiovascular;  Laterality: N/A;   COLONOSCOPY     remote   COLONOSCOPY N/A 05/26/2016   Procedure: COLONOSCOPY;  Surgeon: Daneil Dolin, MD;  [facility];  Service: Endoscopy;  Laterality: N/A;  845   ESOPHAGOGASTRODUODENOSCOPY N/A 05/26/2016   Procedure: ESOPHAGOGASTRODUODENOSCOPY (EGD);  Surgeon: Daneil Dolin, MD;  [facility];  Service: Endoscopy;  Laterality: N/A;   MALONEY DILATION N/A 05/26/2016   Procedure: Venia Minks DILATION;  Surgeon: Daneil Dolin, MD;  [facility];  Service: Endoscopy;  Laterality: N/A;   VITRECTOMY Left 10/03/2019   Dr. Zadie Rhine, Vitrectomy, Focal Laser, Removal of Silicone Oil    FAMILY HISTORY Family History  Problem Relation Age of Onset   Heart disease Mother    COPD Father    Cancer Sister        unknown primary   Cancer Brother        unknown primary   Cancer Brother    Hypertension Son    Colon cancer Neg Hx     SOCIAL HISTORY Social History   Tobacco Use   Smoking status: Former    Types: Cigarettes    Quit date: 12/28/2014    Years since  quitting: 6.4   Smokeless tobacco: Never   Tobacco comments:    Patient quit within the last 10 years  Vaping Use   Vaping Use: Never used  Substance Use Topics   Alcohol use: Never   Drug use: Never         OPHTHALMIC EXAM:  Base Eye Exam     Visual Acuity (ETDRS)       Right Left   Dist [place] 20/60 20/60 -1   Dist ph  NI 20/50 -1         Tonometry (Tonopen, 8:07 AM)  Right Left   Pressure 11 11         Pupils       Dark Light Shape React APD   Right [employer] Dilated   fixed None   Left 5 5 Round Minimal None         Extraocular Movement       Right Left    Full Full         Neuro/Psych     Oriented x3: Yes   Mood/Affect: Normal         Dilation     Right eye: 1.0% Mydriacyl, 2.5% Phenylephrine @ 8:07 AM           Slit Lamp and Fundus Exam     External Exam       Right Left   External Normal Normal         Slit Lamp Exam       Right Left   Lids/Lashes Normal Normal   Conjunctiva/Sclera White and quiet White and quiet   Cornea Clear Clear   Anterior Chamber Deep and quiet Deep and quiet   Iris Round and reactive Round and reactive   Lens Posterior chamber intraocular lens Posterior chamber intraocular lens   Anterior Vitreous Clear Normal         Fundus Exam       Right Left   Posterior Vitreous Clear, avitric    Disc Normal    C/D Ratio 0.4    Macula Typical post ILM removal changes    Vessels Normal    Periphery peripheral panretinal photocoagulation, with less active overlying peripheral hemorrhagic choroidopathy [facility]) in regions of previous peripheral PRP to the choroid and attempt to prevent this type of hemorrhagic disease.  Large subretinal hemorrhage extending from the 8:30 position anteriorly to the 12 o'clock position superiorly and posteriorly into the macula much of it subretinal some of it sub-RPE some resolution of the multifocal hemorrhagic choroidopathy, overall vastly improved clinical findings  as compared to August 2022 and February 17, 2021, with darkening of the subretinal and choroidal hemorrhage, much less extensive exudative retinopathy yet as this has receded there is a subretinal traction band temporally extending from 1 large choroidal elevation to the other, , as sub-RPE and subretinal hemorrhage in the macula has receded             IMAGING AND PROCEDURES  Imaging and Procedures for 05/28/21           ASSESSMENT/PLAN:  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HCC) No active progression of disease at time of vitrectomy  Right epiretinal membrane Postop day #1 acuity improved to 20/60,  Retinal hemorrhage, right eye No active hemorrhages     ICD-10-CM   1. 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[place])  H35.3211     2. Right epiretinal membrane  H35.371     3. Retinal hemorrhage, right eye  H35.61       1.  OD, vastly improved postop day #1 vitrectomy with membrane peel OD, acuity improved  2.  Patient instructed in limitations as well as and resumption of topical eye medications  3.  Ophthalmic Meds Ordered this visit:  No orders of the defined types were placed in this encounter.      Return in about 1 week (around 06/04/2021) for POST OP, dilate, OD, OCT.  Patient Instructions  Ofloxacin  4 times daily to the operative eye  Prednisolone acetate 1 drop to the operative eye 4 times daily  Patient instructed not to refill the medications and use them for maximum of 3 weeks.  Patient instructed do not rub the eye.  Patient has the option to use the patch at night.    No lifting and bending for 1 week. No water IN the eye for 10 days. Do not rub the eye. Wear shield at night for 1-3 days.  Continue your topical medications for a total of 3 weeks.  Do not refill your postoperative medications unless instructed.  Refrain from  exercise or intentional activity which increases our heart rate above resting levels.  Normal walking to complete normal activities of your day are appropriate.  Driving:  Legally, you only need one good eye, of 20/40 or better to drive.  However, the practice does not recommend driving during first weeks after surgery, IF you are uncomfortable with your visual functioning or capabilities.   If you have known sleep apnea, wear your CPAP as you normally should.    Explained the diagnoses, plan, and follow up with the patient and they expressed understanding.  Patient expressed understanding of the importance of proper follow up care.   Clent Demark Jenina Moening M.D. Diseases & Surgery of the Retina and Vitreous Retina & Diabetic [place] 05/28/21     Abbreviations: M myopia (nearsighted); A astigmatism; H hyperopia (farsighted); P presbyopia; Mrx spectacle prescription;  CTL contact lenses; OD right eye; OS left eye; OU both eyes  XT exotropia; ET esotropia; PEK punctate epithelial keratitis; PEE punctate epithelial erosions; DES dry eye syndrome; MGD meibomian gland dysfunction; ATs artificial tears; PFAT's preservative free artificial tears; [place] nuclear sclerotic cataract; PSC posterior subcapsular cataract; ERM epi-retinal membrane; PVD posterior vitreous detachment; RD retinal detachment; DM diabetes mellitus; DR diabetic retinopathy; NPDR non-proliferative diabetic retinopathy; PDR proliferative diabetic retinopathy; CSME clinically significant macular edema; DME diabetic macular edema; dbh dot blot hemorrhages; CWS cotton wool spot; POAG primary open angle glaucoma; C/D cup-to-disc ratio; HVF humphrey visual field; GVF goldmann visual field; OCT optical coherence tomography; IOP intraocular pressure; BRVO Branch retinal vein occlusion; CRVO central retinal vein occlusion; CRAO central retinal artery occlusion; BRAO branch retinal artery occlusion; RT retinal tear; SB scleral buckle; PPV pars plana  vitrectomy; VH Vitreous hemorrhage; PRP panretinal laser photocoagulation; IVK intravitreal kenalog; VMT vitreomacular traction; MH Macular hole;  NVD neovascularization of the disc; NVE neovascularization elsewhere; AREDS age related eye disease study; ARMD age related macular degeneration; POAG primary open angle glaucoma; EBMD epithelial/anterior basement membrane dystrophy; ACIOL anterior chamber intraocular lens; IOL intraocular lens; PCIOL posterior chamber intraocular lens; Phaco/IOL phacoemulsification with intraocular lens placement; [employer] photorefractive keratectomy; LASIK laser assisted in situ keratomileusis; HTN hypertension; DM diabetes mellitus; COPD chronic obstructive pulmonary disease

## 2021-05-29 LAB — ERYTHROPOIETIN: Erythropoietin: 90.1 m[IU]/mL — ABNORMAL HIGH (ref 2.6–18.5)

## 2021-06-02 DIAGNOSIS — K219 Gastro-esophageal reflux disease without esophagitis: Secondary | ICD-10-CM | POA: Diagnosis not present

## 2021-06-03 ENCOUNTER — Encounter (INDEPENDENT_AMBULATORY_CARE_PROVIDER_SITE_OTHER): Payer: Medicare Other | Admitting: Ophthalmology

## 2021-06-08 ENCOUNTER — Other Ambulatory Visit: Payer: Self-pay

## 2021-06-08 ENCOUNTER — Ambulatory Visit (INDEPENDENT_AMBULATORY_CARE_PROVIDER_SITE_OTHER): Payer: Medicare Other | Admitting: Ophthalmology

## 2021-06-08 ENCOUNTER — Encounter (INDEPENDENT_AMBULATORY_CARE_PROVIDER_SITE_OTHER): Payer: Self-pay | Admitting: Ophthalmology

## 2021-06-08 DIAGNOSIS — H353211 Exudative age-related macular degeneration, right eye, with active choroidal neovascularization: Secondary | ICD-10-CM

## 2021-06-08 DIAGNOSIS — H31401 Unspecified choroidal detachment, right eye: Secondary | ICD-10-CM

## 2021-06-08 DIAGNOSIS — H35371 Puckering of macula, right eye: Secondary | ICD-10-CM

## 2021-06-08 NOTE — Assessment & Plan Note (Signed)
Visual acuity OD stable on the is a chance improved with less CME, less thickening from ERM ?

## 2021-06-08 NOTE — Patient Instructions (Signed)
Ofloxacin  4 times daily to the operative eye ? ?Prednisolone acetate 1 drop to the operative eye 4 times daily ? ? use the above medications for 1 more week ? ? ? ? ? ?Patient instructed not to refill the medications and use them for maximum of 2 weeks. ? ?Patient instructed do not rub the eye.  Patient has the option to use the patch at night.  ?

## 2021-06-08 NOTE — Progress Notes (Signed)
06/08/2021     CHIEF COMPLAINT Patient presents for  Chief Complaint  Patient presents with   Post-op Follow-up      HISTORY OF PRESENT ILLNESS: Patricia Jackson is a 81 y.o. female who presents to the clinic today for:   HPI     Post-op Follow-up           Laterality: right eye   Discomfort: Negative for pain, itching, foreign body sensation and tearing   Vision: is stable         Comments   1 week PO, Dilate OD, OCT, Sx 05/27/2021 vitrectomy with membrane peel. Patient states vision is stable and unchanged since last visit. Denies any new floaters or FOL. Pt states "I still have a grey coating over the eye but no improvement  or worsening."  Pt is using prednisolone and ofloxacin QID OD.      Last edited by Laurin Coder on 06/08/2021  8:41 AM.      Referring physician: Celene Squibb, MD [place],  [place] [address]  HISTORICAL INFORMATION:   Selected notes from the MEDICAL RECORD NUMBER    Lab Results  Component Value Date   HGBA1C 5.6 10/30/2015     CURRENT MEDICATIONS: No current outpatient medications on file. (Ophthalmic Drugs)   No current facility-administered medications for this visit. (Ophthalmic Drugs)   Current Outpatient Medications (Other)  Medication Sig   acetaminophen (TYLENOL) 500 MG tablet Take 1,500 mg by mouth every 4 (four) hours as needed.   albuterol (PROVENTIL) (2.5 MG/3ML) 0.083% nebulizer solution Take 2.5 mg by nebulization every 6 (six) hours as needed for wheezing or shortness of breath.   albuterol (VENTOLIN HFA) 108 (90 Base) MCG/ACT inhaler Inhale 2 puffs into the lungs every 6 (six) hours as needed for wheezing or shortness of breath.   amLODipine-olmesartan (AZOR) 10-40 MG tablet Take 1 tablet by mouth every morning.   benzonatate (TESSALON) 100 MG capsule Take 1 capsule (100 mg total) by mouth every 8 (eight) hours.   buPROPion (WELLBUTRIN XL) 300 MG 24 hr tablet Take 300 mg by mouth daily.    Cholecalciferol (VITAMIN D3) 50 MCG (2000 UT) TABS Take 2,000 Units by mouth daily.   denosumab (PROLIA) 60 MG/ML SOSY injection Inject 60 mg into the skin every 6 (six) months.   ELIQUIS 5 MG TABS tablet TAKE ONE TABLET (5MG TOTAL) BY MOUTH TWOTIMES DAILY   ferrous sulfate 325 (65 FE) MG tablet Take 325 mg by mouth daily with breakfast.    fluvoxaMINE (LUVOX) 100 MG tablet Take 1 tablet (100 mg total) by mouth 2 (two) times daily.   levothyroxine (SYNTHROID) 50 MCG tablet Take 50 mcg by mouth daily before breakfast.    metoprolol succinate (TOPROL-XL) 25 MG 24 hr tablet Take 25 mg by mouth daily.   metoprolol succinate (TOPROL-XL) 50 MG 24 hr tablet Take 50 mg by mouth daily.   nitroGLYCERIN (NITROSTAT) 0.4 MG SL tablet Place 0.4 mg under the tongue every 5 (five) minutes as needed for chest pain.   predniSONE (DELTASONE) 20 MG tablet Take 2 tablets (40 mg total) by mouth daily.   rosuvastatin (CRESTOR) 20 MG tablet Take 20 mg by mouth daily.    sertraline (ZOLOFT) 25 MG tablet Take 25 mg by mouth daily.   TRELEGY ELLIPTA 200-62.5-25 MCG/INH AEPB Inhale 1 puff into the lungs daily.   No current facility-administered medications for this visit. (Other)      REVIEW OF  SYSTEMS: ROS   Negative for: Constitutional, Gastrointestinal, Neurological, Skin, Genitourinary, Musculoskeletal, HENT, Endocrine, Cardiovascular, Eyes, Respiratory, Psychiatric, Allergic/Imm, Heme/Lymph Last edited by Hurman Horn, MD on 06/08/2021 10:07 AM.       ALLERGIES Allergies  Allergen Reactions   Sulfa Antibiotics Hives   Sulfa Antibiotics Hives and Other (See Comments)    Reaction not recalled by the patient, but "hives" were noted in her other profile in Epic    PAST MEDICAL HISTORY Past Medical History:  Diagnosis Date   A-fib [facility])    Atrial flutter ([place])    On Eliquis in 8/17 but discontinued after stent placement   Breast cancer ([place])    remote   CAD in native artery 03/16/2016   COPD (chronic  obstructive pulmonary disease) ([place])    Hypertension    S/P angioplasty with stent 03/15/16 DES Resolute, pLCX 03/16/2016   Vitreous hemorrhage of left eye ([place]) 08/01/2019   Past Surgical History:  Procedure Laterality Date   APPENDECTOMY     BREAST SURGERY Left    CARDIAC CATHETERIZATION N/A 03/15/2016   Procedure: Left Heart Cath and Coronary Angiography;  Surgeon: Belva Crome, MD;  Location: [place] [facility];  Service: Cardiovascular;  Laterality: N/A;   CARDIAC CATHETERIZATION N/A 03/15/2016   Procedure: Coronary Stent Intervention;  Surgeon: Belva Crome, MD;  Location: [place] [facility];  Service: Cardiovascular;  Laterality: N/A;   COLONOSCOPY     remote   COLONOSCOPY N/A 05/26/2016   Procedure: COLONOSCOPY;  Surgeon: Daneil Dolin, MD;  [facility];  Service: Endoscopy;  Laterality: N/A;  845   ESOPHAGOGASTRODUODENOSCOPY N/A 05/26/2016   Procedure: ESOPHAGOGASTRODUODENOSCOPY (EGD);  Surgeon: Daneil Dolin, MD;  [facility];  Service: Endoscopy;  Laterality: N/A;   MALONEY DILATION N/A 05/26/2016   Procedure: Venia Minks DILATION;  Surgeon: Daneil Dolin, MD;  [facility];  Service: Endoscopy;  Laterality: N/A;   VITRECTOMY Left 10/03/2019   Dr. Zadie Rhine, Vitrectomy, Focal Laser, Removal of Silicone Oil    FAMILY HISTORY Family History  Problem Relation Age of Onset   Heart disease Mother    COPD Father    Cancer Sister        unknown primary   Cancer Brother        unknown primary   Cancer Brother    Hypertension Son    Colon cancer Neg Hx     SOCIAL HISTORY Social History   Tobacco Use   Smoking status: Former    Types: Cigarettes    Quit date: 12/28/2014    Years since quitting: 6.4   Smokeless tobacco: Never   Tobacco comments:    Patient quit within the last 10 years  Vaping Use   Vaping Use: Never used  Substance Use Topics   Alcohol use: Never   Drug use: Never         OPHTHALMIC EXAM:  Base Eye Exam      Visual Acuity (ETDRS)       Right Left   Dist [place] 20/80 -2 20/60 +2   Dist ph [place] NI 20/40 -2         Tonometry (Tonopen, 8:44 AM)       Right Left   Pressure 13 9         Pupils       Pupils Dark Light Shape React APD   Right PERRL 5 5 Round Minimal None   Left PERRL 5 5 Round  Minimal None         Extraocular Movement       Right Left    Full Full         Neuro/Psych     Oriented x3: Yes   Mood/Affect: Normal         Dilation     Right eye: 1.0% Mydriacyl, 2.5% Phenylephrine @ 8:44 AM           Slit Lamp and Fundus Exam     External Exam       Right Left   External Normal Normal         Slit Lamp Exam       Right Left   Lids/Lashes Normal Normal   Conjunctiva/Sclera White and quiet White and quiet   Cornea Clear Clear   Anterior Chamber Deep and quiet Deep and quiet   Iris Round and reactive Round and reactive   Lens Posterior chamber intraocular lens Posterior chamber intraocular lens   Anterior Vitreous Clear Normal         Fundus Exam       Right Left   Posterior Vitreous Clear, avitric    Disc Normal    C/D Ratio 0.4    Macula Flat, no ERM    Vessels Normal    Periphery Old dark hemorrhage nasally, no active hemorrhage peripheral retina, good laser peripherally, a attached everywhere             IMAGING AND PROCEDURES  Imaging and Procedures for 06/08/21  OCT, Retina - OU - Both Eyes       Right Eye Quality was good. Scan locations included subfoveal. Central Foveal Thickness: 316. Progression has improved. Findings include subretinal hyper-reflective material, epiretinal membrane.   Left Eye Quality was good. Scan locations included subfoveal. Central Foveal Thickness: 264. Findings include abnormal foveal contour, retinal drusen , no SRF, no IRF.   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 Right epiretinal membrane  H35.371 OCT, Retina - OU - Both Eyes    2. 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[place])  H35.3211     3. 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  OD vastly improved macular findings post vitrectomy membrane peel for ERM with secondary CME.  A attached  2.  History of peripheral PECHR OU, each condition in each eye peripherally stable without active disease  3.  Ophthalmic Meds Ordered this visit:  No orders of the defined types were placed in this encounter.      Return in about 6 weeks (around 07/20/2021) for DILATE OU, COLOR FP, OCT, POST OP, OD.  Patient Instructions  Ofloxacin  4 times daily to the operative eye  Prednisolone acetate 1 drop to the operative eye 4 times daily   use the above medications for 1 more week  Patient instructed not to refill the medications and use them for maximum of 2 weeks.  Patient instructed do not rub the eye.  Patient has the option to use the patch at night.    Explained the diagnoses, plan, and follow up with the patient and they expressed understanding.  Patient expressed understanding of the importance of proper follow up care.   Clent Demark Turhan Chill M.D. Diseases & Surgery of the Retina and Vitreous Retina & Diabetic [place] 06/08/21     Abbreviations: M myopia (nearsighted); A astigmatism; H hyperopia (farsighted); P presbyopia; Mrx spectacle prescription;  CTL contact  lenses; OD right eye; OS left eye; OU both eyes  XT exotropia; ET esotropia; PEK punctate epithelial keratitis; PEE punctate epithelial erosions; DES dry eye syndrome; MGD meibomian gland dysfunction; ATs artificial tears; PFAT's preservative free artificial tears; [place] nuclear sclerotic cataract; PSC posterior subcapsular cataract; ERM epi-retinal membrane; PVD posterior vitreous detachment; RD retinal detachment; DM diabetes mellitus; DR diabetic retinopathy; NPDR non-proliferative diabetic retinopathy; PDR proliferative diabetic retinopathy; CSME clinically significant macular edema; DME diabetic macular edema; dbh dot blot hemorrhages; CWS cotton wool spot; POAG primary open angle glaucoma; C/D cup-to-disc ratio; HVF humphrey visual field; GVF goldmann visual field; OCT optical coherence tomography; IOP intraocular pressure; BRVO Branch retinal vein occlusion; CRVO central retinal vein occlusion; CRAO central retinal artery occlusion; BRAO branch retinal artery occlusion; RT retinal tear; SB scleral buckle; PPV pars plana vitrectomy; VH Vitreous hemorrhage; PRP panretinal laser photocoagulation; IVK intravitreal kenalog; VMT vitreomacular traction; MH Macular hole;  NVD neovascularization of the disc; NVE neovascularization elsewhere; AREDS age related eye disease study; ARMD age related macular degeneration; POAG primary open angle glaucoma; EBMD epithelial/anterior basement membrane dystrophy; ACIOL anterior chamber intraocular lens; IOL intraocular lens; PCIOL posterior chamber intraocular lens; Phaco/IOL phacoemulsification with intraocular lens placement; [place] photorefractive keratectomy; LASIK laser assisted in situ keratomileusis; HTN hypertension; DM diabetes mellitus; COPD chronic obstructive pulmonary disease

## 2021-06-08 NOTE — Assessment & Plan Note (Signed)
Peripheral component of PECHR in the past, stabilized with antivegF ?

## 2021-06-08 NOTE — Assessment & Plan Note (Signed)
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

## 2021-06-09 LAB — METHYLMALONIC ACID, SERUM: Methylmalonic Acid, Quantitative: 115 nmol/L (ref 0–378)

## 2021-06-10 ENCOUNTER — Ambulatory Visit (HOSPITAL_COMMUNITY): Payer: Medicare Other | Admitting: Physician Assistant

## 2021-06-11 ENCOUNTER — Other Ambulatory Visit (HOSPITAL_COMMUNITY): Payer: Self-pay | Admitting: Physician Assistant

## 2021-06-11 DIAGNOSIS — D649 Anemia, unspecified: Secondary | ICD-10-CM

## 2021-06-11 NOTE — Progress Notes (Signed)
[address], [place] [address]   CLINIC:  Medical Oncology/Hematology  PCP:  Celene Squibb, MD [place] [address] [phone]   REASON FOR VISIT:  Follow-up for anemia and thrombocytopenia  PRIOR THERAPY: None  CURRENT THERAPY: Under work-up  INTERVAL HISTORY:  Ms. Belflower 81 y.o. female returns for routine follow-up of her chronic thrombocytopenia and new anemia.  She was last seen by Tarri Abernethy PA-C on 05/26/2021.  She returns today to discuss results of work-up for new anemia.  At today's visit, she reports feeling about the same as at her last visit.  No recent hospitalizations, surgeries, or changes in baseline health status.  She continues to deny any obvious signs of blood loss such as epistaxis, hematemesis, hematochezia, melena, or hematuria.   She continues to have some increasing fatigue (energy 25%), lightheadedness, and weakness.   She has not had any syncopal episodes, palpitations, chest pain, pica, restless legs, or headaches.   She has chronic dyspnea on exertion from her COPD.   She reports a poor appetite, eats 1-2 meals daily.  She has lost about 10 pounds in the past 6 months.   REVIEW OF SYSTEMS:  Review of Systems  Constitutional:  Positive for fatigue. Negative for appetite change, chills, diaphoresis, fever and unexpected weight change.  HENT:   Negative for lump/mass and nosebleeds.   Eyes:  Negative for eye problems.  Respiratory:  Positive for shortness of breath. Negative for cough and hemoptysis.   Cardiovascular:  Negative for chest pain, leg swelling and palpitations.  Gastrointestinal:  Negative for abdominal pain, blood in stool, constipation, diarrhea, nausea and vomiting.  Genitourinary:  Negative for hematuria.   Skin: Negative.   Neurological:  Positive for numbness. Negative for dizziness, headaches and light-headedness.  Hematological:  Does not bruise/bleed easily.      PAST MEDICAL/SURGICAL HISTORY:  Past Medical History:  Diagnosis Date   A-fib [facility])    Atrial flutter ([place])    On Eliquis in 8/17 but discontinued after stent placement   Breast cancer ([place])    remote   CAD in native artery 03/16/2016   COPD (chronic obstructive pulmonary disease) ([place])    Hypertension    S/P angioplasty with stent 03/15/16 DES Resolute, pLCX 03/16/2016   Vitreous hemorrhage of left eye ([place]) 08/01/2019   Past Surgical History:  Procedure Laterality Date   APPENDECTOMY     BREAST SURGERY Left    CARDIAC CATHETERIZATION N/A 03/15/2016   Procedure: Left Heart Cath and Coronary Angiography;  Surgeon: Belva Crome, MD;  Location: [facility];  Service: Cardiovascular;  Laterality: N/A;   CARDIAC CATHETERIZATION N/A 03/15/2016   Procedure: Coronary Stent Intervention;  Surgeon: Belva Crome, MD;  Location: [facility];  Service: Cardiovascular;  Laterality: N/A;   COLONOSCOPY     remote   COLONOSCOPY N/A 05/26/2016   Procedure: COLONOSCOPY;  Surgeon: Daneil Dolin, MD;  Location: [facility];  Service: Endoscopy;  Laterality: N/A;  845   ESOPHAGOGASTRODUODENOSCOPY N/A 05/26/2016   Procedure: ESOPHAGOGASTRODUODENOSCOPY (EGD);  Surgeon: Daneil Dolin, MD;  Location[facility];  Service: Endoscopy;  Laterality: N/A;   MALONEY DILATION N/A 05/26/2016   Procedure: Venia Minks DILATION;  Surgeon: Daneil Dolin, MD;  Location: [facility];  Service: Endoscopy;  Laterality: N/A;   VITRECTOMY Left 10/03/2019   Dr. Zadie Rhine, Vitrectomy, Focal Laser, Removal of Silicone Oil  SOCIAL HISTORY:  Social History   Socioeconomic History   Marital status: Divorced    Spouse name: Not on file   Number of children: 1   Years of education: Not on file   Highest education level: Not on file  Occupational History   Occupation: Retired  Tobacco Use   Smoking status: Former    Types: Cigarettes    Quit date: 12/28/2014    Years since quitting: 6.4    Smokeless tobacco: Never   Tobacco comments:    Patient quit within the last 10 years  Vaping Use   Vaping Use: Never used  Substance and Sexual Activity   Alcohol use: Never   Drug use: Never   Sexual activity: Not Currently  Other Topics Concern   Not on file  Social History Narrative   ** Merged History Encounter **       Social Determinants of Health   Financial Resource Strain: Not on file  Food Insecurity: Not on file  Transportation Needs: Not on file  Physical Activity: Not on file  Stress: Not on file  Social Connections: Not on file  Intimate Partner Violence: Not on file    FAMILY HISTORY:  Family History  Problem Relation Age of Onset   Heart disease Mother    COPD Father    Cancer Sister        unknown primary   Cancer Brother        unknown primary   Cancer Brother    Hypertension Son    Colon cancer Neg Hx     CURRENT MEDICATIONS:  Outpatient Encounter Medications as of 06/12/2021  Medication Sig   acetaminophen (TYLENOL) 500 MG tablet Take 1,500 mg by mouth every 4 (four) hours as needed.   albuterol (PROVENTIL) (2.5 MG/3ML) 0.083% nebulizer solution Take 2.5 mg by nebulization every 6 (six) hours as needed for wheezing or shortness of breath.   albuterol (VENTOLIN HFA) 108 (90 Base) MCG/ACT inhaler Inhale 2 puffs into the lungs every 6 (six) hours as needed for wheezing or shortness of breath.   amLODipine-olmesartan (AZOR) 10-40 MG tablet Take 1 tablet by mouth every morning.   benzonatate (TESSALON) 100 MG capsule Take 1 capsule (100 mg total) by mouth every 8 (eight) hours.   buPROPion (WELLBUTRIN XL) 300 MG 24 hr tablet Take 300 mg by mouth daily.   Cholecalciferol (VITAMIN D3) 50 MCG (2000 UT) TABS Take 2,000 Units by mouth daily.   denosumab (PROLIA) 60 MG/ML SOSY injection Inject 60 mg into the skin every 6 (six) months.   ELIQUIS 5 MG TABS tablet TAKE ONE TABLET (5MG TOTAL) BY MOUTH TWOTIMES DAILY   ferrous sulfate 325 (65 FE) MG tablet Take  325 mg by mouth daily with breakfast.    fluvoxaMINE (LUVOX) 100 MG tablet Take 1 tablet (100 mg total) by mouth 2 (two) times daily.   levothyroxine (SYNTHROID) 50 MCG tablet Take 50 mcg by mouth daily before breakfast.    metoprolol succinate (TOPROL-XL) 25 MG 24 hr tablet Take 25 mg by mouth daily.   metoprolol succinate (TOPROL-XL) 50 MG 24 hr tablet Take 50 mg by mouth daily.   nitroGLYCERIN (NITROSTAT) 0.4 MG SL tablet Place 0.4 mg under the tongue every 5 (five) minutes as needed for chest pain.   predniSONE (DELTASONE) 20 MG tablet Take 2 tablets (40 mg total) by mouth daily.   rosuvastatin (CRESTOR) 20 MG tablet Take 20 mg by mouth daily.    sertraline (ZOLOFT) 25 MG  tablet Take 25 mg by mouth daily.   TRELEGY ELLIPTA 200-62.5-25 MCG/INH AEPB Inhale 1 puff into the lungs daily.   No facility-administered encounter medications on file as of 06/12/2021.    ALLERGIES:  Allergies  Allergen Reactions   Sulfa Antibiotics Hives   Sulfa Antibiotics Hives and Other (See Comments)    Reaction not recalled by the patient, but "hives" were noted in her other profile in Epic     PHYSICAL EXAM:  ECOG PERFORMANCE STATUS: 1 - Symptomatic but completely ambulatory  There were no vitals filed for this visit. There were no vitals filed for this visit. Physical Exam Constitutional:      Appearance: Normal appearance.  HENT:     Head: Normocephalic and atraumatic.     Mouth/Throat:     Mouth: Mucous membranes are moist.  Eyes:     Extraocular Movements: Extraocular movements intact.     Pupils: Pupils are equal, round, and reactive to light.  Cardiovascular:     Rate and Rhythm: Normal rate and regular rhythm.     Pulses: Normal pulses.     Heart sounds: Normal heart sounds.  Pulmonary:     Effort: Pulmonary effort is normal.     Breath sounds: Normal breath sounds. Decreased air movement present.  Abdominal:     General: Bowel sounds are normal.     Palpations: Abdomen is soft.      Tenderness: There is no abdominal tenderness.  Musculoskeletal:        General: No swelling.     Right lower leg: No edema.     Left lower leg: No edema.  Lymphadenopathy:     Cervical: No cervical adenopathy.  Skin:    General: Skin is warm and dry.     Findings: Bruising present.  Neurological:     General: No focal deficit present.     Mental Status: She is alert and oriented to person, place, and time.  Psychiatric:        Mood and Affect: Mood normal.        Behavior: Behavior normal.     LABORATORY DATA:  I have reviewed the labs as listed.  CBC    Component Value Date/Time   WBC 5.6 05/26/2021 1046   RBC 2.69 (L) 05/26/2021 1046   RBC 2.63 (L) 05/26/2021 1046   HGB 7.7 (L) 05/26/2021 1046   HCT 27.4 (L) 05/26/2021 1046   PLT 185 05/26/2021 1046   MCV 101.9 (H) 05/26/2021 1046   MCH 28.6 05/26/2021 1046   MCHC 28.1 (L) 05/26/2021 1046   RDW 15.9 (H) 05/26/2021 1046   LYMPHSABS 0.9 05/26/2021 1046   MONOABS 0.4 05/26/2021 1046   EOSABS 0.1 05/26/2021 1046   BASOSABS 0.0 05/26/2021 1046   CMP Latest Ref Rng & Units 05/26/2021 05/19/2021 07/06/2019  Glucose 70 - 99 mg/dL 101(H) 123(H) 104(H)  BUN 8 - 23 mg/dL 25(H) 30(H) 26(H)  Creatinine 0.44 - 1.00 mg/dL 1.50(H) 1.20(H) 1.53(H)  Sodium 135 - 145 mmol/L 137 139 141  Potassium 3.5 - 5.1 mmol/L 4.4 4.1 4.5  Chloride 98 - 111 mmol/L 112(H) 113(H) 114(H)  CO2 22 - 32 mmol/L 18(L) 17(L) 22  Calcium 8.9 - 10.3 mg/dL 8.9 8.9 9.1  Total Protein 6.5 - 8.1 g/dL 6.1(L) 5.8(L) -  Total Bilirubin 0.3 - 1.2 mg/dL 0.6 0.4 -  Alkaline Phos 38 - 126 U/L 53 50 -  AST 15 - 41 U/L 41 31 -  ALT 0 - 44 U/L  26 18 -    DIAGNOSTIC IMAGING:  I have independently reviewed the relevant imaging and discussed with the patient.  ASSESSMENT & PLAN: 1.  Anemia, secondary to copper and iron deficiency  - Onset of anemia noted on 05/19/2021, with Hgb 8.0/MCV 101.4.  Prior to this, she had Hgb 12.1 in July 2022.  Labs sent by PCP (05/11/2021):  Hgb 9.2/MCV 96.  Creatinine was at baseline 1.38. - Record review does show severe anemia in December 2017/January 2018 with Hgb 6.5, which was due to iron deficiency of unknown source.  She received PRBC x2. 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  B12/methylmalonic acid, folate normal.  Normal SPEP/immunofixation.  DAT, haptoglobin, reticulocytes normal.  CMP showed baseline CKD stage IIIa/b - She was Hemoccult negative in January 2018.  Most recent Hemoccult stool pending.   - She takes Eliquis for atrial fibrillation - She takes iron pill daily but is experiencing significant constipation - She denies any bright red blood per rectum or melena  - She is symptomatic with increasing fatigue and episodes of lightheadedness  - Last CBC (05/26/2021) with Hgb 7.7/MCV 101.9 - CBC today (06/12/2021): Hgb 10.0/MCV 101.7 - PLAN: Recommend IV iron with Feraheme x2 (We discussed risks of IV iron including risk of allergic reaction and various adverse side effects, and patient is agreeable with continuing IV iron) - Patient can STOP oral iron, since it has been ineffective and is causing significant constipation - Start copper supplement 2 mg twice daily - Patient reminded to complete stool cards - Repeat labs and RTC in 2 months - If no improvement after iron/copper repletion, would consider bone marrow biopsy to rule out MDS in the setting of mild macrocytosis and thrombocytopenia.  Would also consider starting ESA for anemia that may be attributable to CKD.   2.  Thrombocytopenia, mild to moderate - Seen at the request of Dr. Nevada Crane for platelet count of 62.  She has some easy bruising. - She is on Eliquis for atrial fibrillation. - Hematologic work-up showed SPEP negative.  B12,  methylmalonic acid, copper and folic acid levels were normal when checked in March 2021.  LDH was normal.  White count and platelet count was normal. - ANA was positive with a dilution of 1 and 1280.  (She has positive Raynaud's phenomena.  Evaluated by rheumatology.)  Rheumatoid factor is negative.  Hepatitis panel was negative. - She admits to easy bruising but denies any signs or symptoms of blood loss  - Most recent CBC (05/26/2021): Platelets improved at 185, normal differential.  Previous CBC (05/19/2021) showed platelets 132 (stable at baseline), mild lymphopenia with lymphocyte 0.5.  Normal LDH. - Differential diagnosis favors ITP versus early MDS - PLAN: We will continue monitor with CBC at least every 6 months.  Will consider bone marrow biopsy if any significant deviations from baseline or if patient develops B symptoms.   3.  Vitamin D deficiency - Patient was started on vitamin D 1000 units daily at her last visit - Most recent labs (05/19/2021): Vitamin D normal at 58.59 - PLAN: Continue vitamin D supplementation   4.  Tobacco abuse - Smokes 2 PPD x60 years - CT chest lung cancer screening in November 2022 was lung RADS 2, benign - PLAN: Patient will continue her annual LDCT scan via NP Eric Form  5.  Other history HX CANCER??? - She has a remote history of breast cancer - She has history of atrial fibrillation/flutter on Eliquis - She has COPD   PLAN SUMMARY & DISPOSITION: Feraheme x2 Labs in 2 months RTC the week after labs  All questions were answered. The patient knows to call the clinic with any problems, questions or concerns.  Medical decision making: Moderate  Time spent on visit: I spent 20 minutes counseling the patient face to face. The total time spent in the appointment was 30 minutes and more than 50% was on counseling.   Harriett Rush, PA-C  06/12/2021 9:15 AM

## 2021-06-12 ENCOUNTER — Inpatient Hospital Stay (HOSPITAL_COMMUNITY): Payer: Medicare Other | Attending: Hematology | Admitting: Physician Assistant

## 2021-06-12 ENCOUNTER — Other Ambulatory Visit: Payer: Self-pay

## 2021-06-12 ENCOUNTER — Inpatient Hospital Stay (HOSPITAL_COMMUNITY): Payer: Medicare Other

## 2021-06-12 ENCOUNTER — Encounter (HOSPITAL_COMMUNITY): Payer: Self-pay | Admitting: Physician Assistant

## 2021-06-12 VITALS — BP 149/86 | HR 64 | Temp 98.1°F | Resp 19 | Ht 68.0 in | Wt 135.9 lb

## 2021-06-12 DIAGNOSIS — E610 Copper deficiency: Secondary | ICD-10-CM | POA: Diagnosis not present

## 2021-06-12 DIAGNOSIS — R0609 Other forms of dyspnea: Secondary | ICD-10-CM | POA: Insufficient documentation

## 2021-06-12 DIAGNOSIS — Z836 Family history of other diseases of the respiratory system: Secondary | ICD-10-CM | POA: Diagnosis not present

## 2021-06-12 DIAGNOSIS — R531 Weakness: Secondary | ICD-10-CM | POA: Insufficient documentation

## 2021-06-12 DIAGNOSIS — R42 Dizziness and giddiness: Secondary | ICD-10-CM | POA: Diagnosis not present

## 2021-06-12 DIAGNOSIS — Z8249 Family history of ischemic heart disease and other diseases of the circulatory system: Secondary | ICD-10-CM | POA: Diagnosis not present

## 2021-06-12 DIAGNOSIS — R630 Anorexia: Secondary | ICD-10-CM | POA: Diagnosis not present

## 2021-06-12 DIAGNOSIS — I2510 Atherosclerotic heart disease of native coronary artery without angina pectoris: Secondary | ICD-10-CM | POA: Diagnosis not present

## 2021-06-12 DIAGNOSIS — Z809 Family history of malignant neoplasm, unspecified: Secondary | ICD-10-CM | POA: Insufficient documentation

## 2021-06-12 DIAGNOSIS — D509 Iron deficiency anemia, unspecified: Secondary | ICD-10-CM | POA: Insufficient documentation

## 2021-06-12 DIAGNOSIS — Z882 Allergy status to sulfonamides status: Secondary | ICD-10-CM | POA: Diagnosis not present

## 2021-06-12 DIAGNOSIS — R200 Anesthesia of skin: Secondary | ICD-10-CM | POA: Diagnosis not present

## 2021-06-12 DIAGNOSIS — J449 Chronic obstructive pulmonary disease, unspecified: Secondary | ICD-10-CM | POA: Insufficient documentation

## 2021-06-12 DIAGNOSIS — I10 Essential (primary) hypertension: Secondary | ICD-10-CM | POA: Insufficient documentation

## 2021-06-12 DIAGNOSIS — Z853 Personal history of malignant neoplasm of breast: Secondary | ICD-10-CM | POA: Insufficient documentation

## 2021-06-12 DIAGNOSIS — E559 Vitamin D deficiency, unspecified: Secondary | ICD-10-CM | POA: Diagnosis not present

## 2021-06-12 DIAGNOSIS — Z7901 Long term (current) use of anticoagulants: Secondary | ICD-10-CM | POA: Insufficient documentation

## 2021-06-12 DIAGNOSIS — K5900 Constipation, unspecified: Secondary | ICD-10-CM | POA: Diagnosis not present

## 2021-06-12 DIAGNOSIS — Z79899 Other long term (current) drug therapy: Secondary | ICD-10-CM | POA: Diagnosis not present

## 2021-06-12 DIAGNOSIS — Z9049 Acquired absence of other specified parts of digestive tract: Secondary | ICD-10-CM | POA: Insufficient documentation

## 2021-06-12 DIAGNOSIS — D696 Thrombocytopenia, unspecified: Secondary | ICD-10-CM | POA: Insufficient documentation

## 2021-06-12 DIAGNOSIS — Z87891 Personal history of nicotine dependence: Secondary | ICD-10-CM | POA: Diagnosis not present

## 2021-06-12 DIAGNOSIS — D649 Anemia, unspecified: Secondary | ICD-10-CM

## 2021-06-12 HISTORY — DX: Iron deficiency anemia, unspecified: D50.9

## 2021-06-12 LAB — CBC
HCT: 35 % — ABNORMAL LOW (ref 36.0–46.0)
Hemoglobin: 10 g/dL — ABNORMAL LOW (ref 12.0–15.0)
MCH: 29.1 pg (ref 26.0–34.0)
MCHC: 28.6 g/dL — ABNORMAL LOW (ref 30.0–36.0)
MCV: 101.7 fL — ABNORMAL HIGH (ref 80.0–100.0)
Platelets: 138 10*3/uL — ABNORMAL LOW (ref 150–400)
RBC: 3.44 MIL/uL — ABNORMAL LOW (ref 3.87–5.11)
RDW: 14.8 % (ref 11.5–15.5)
WBC: 4.9 10*3/uL (ref 4.0–10.5)
nRBC: 0 % (ref 0.0–0.2)

## 2021-06-12 LAB — SAMPLE TO BLOOD BANK

## 2021-06-12 NOTE — Patient Instructions (Addendum)
[place] at [facility] ?Discharge Instructions ? ?You were seen today by Tarri Abernethy PA-C for your anemia.  At your last appointment, we checked labs to find the reason why your blood count is so low.  This showed that you have low iron and low copper levels. ?We will schedule you for IV iron x2 doses. ?You can STOP taking your iron pill. ?START taking over-the-counter copper supplement.  (Either take 5 mg once per daily or 2 mg twice daily) ?Complete and return the stool cards so that we can see if you are losing any blood in your bowel movements. ? ?FOLLOW-UP APPOINTMENT: Repeat labs and office visit in 2 months ? ? ?Thank you for choosing [place] at [facility] to provide your oncology and hematology care.  To afford each patient quality time with our provider, please arrive at least 15 minutes before your scheduled appointment time.  ? ?If you have a lab appointment with the [facility] please come in thru the Main Entrance and check in at the main information desk. ? ?You need to re-schedule your appointment should you arrive 10 or more minutes late.  We strive to give you quality time with our providers, and arriving late affects you and other patients whose appointments are after yours.  Also, if you no show three or more times for appointments you may be dismissed from the clinic at the providers discretion.     ?Again, thank you for choosing [facility].  Our hope is that these requests will decrease the amount of time that you wait before being seen by our physicians.       ?_____________________________________________________________ ? ?Should you have questions after your visit to [facility], please contact our office at [phone] and follow the prompts.  Our office hours are 8:00 a.m. and 4:30 p.m. Monday - Friday.  Please note that voicemails left after 4:00 p.m. may not be returned until the following  business day.  We are closed weekends and major holidays.  You do have access to a nurse 24-7, just call the main number to the clinic [phone] and do not press any options, hold on the line and a nurse will answer the phone.   ? ?For prescription refill requests, have your pharmacy contact our office and allow 72 hours.   ? ?Due to Covid, you will need to wear a mask upon entering the hospital. If you do not have a mask, a mask will be given to you at the Main Entrance upon arrival. For doctor visits, patients may have 1 support person age 17 or older with them. For treatment visits, patients can not have anyone with them due to social distancing guidelines and our immunocompromised population.  ? ? ? ?

## 2021-06-17 ENCOUNTER — Other Ambulatory Visit: Payer: Self-pay

## 2021-06-17 ENCOUNTER — Encounter: Payer: Self-pay | Admitting: Cardiology

## 2021-06-17 ENCOUNTER — Other Ambulatory Visit (HOSPITAL_COMMUNITY)
Admission: RE | Admit: 2021-06-17 | Discharge: 2021-06-17 | Disposition: A | Discharge status: Home / Self Care | Payer: Medicare Other | Source: Ambulatory Visit | Attending: Cardiology | Admitting: Cardiology

## 2021-06-17 ENCOUNTER — Ambulatory Visit (INDEPENDENT_AMBULATORY_CARE_PROVIDER_SITE_OTHER): Payer: Medicare Other | Admitting: Cardiology

## 2021-06-17 VITALS — BP 102/56 | HR 60 | Ht 66.0 in | Wt 136.0 lb

## 2021-06-17 DIAGNOSIS — Z28310 Unvaccinated for covid-19: Secondary | ICD-10-CM | POA: Diagnosis not present

## 2021-06-17 DIAGNOSIS — Z20822 Contact with and (suspected) exposure to covid-19: Secondary | ICD-10-CM | POA: Diagnosis not present

## 2021-06-17 DIAGNOSIS — Z8249 Family history of ischemic heart disease and other diseases of the circulatory system: Secondary | ICD-10-CM | POA: Diagnosis not present

## 2021-06-17 DIAGNOSIS — E7800 Pure hypercholesterolemia, unspecified: Secondary | ICD-10-CM | POA: Insufficient documentation

## 2021-06-17 DIAGNOSIS — I129 Hypertensive chronic kidney disease with stage 1 through stage 4 chronic kidney disease, or unspecified chronic kidney disease: Secondary | ICD-10-CM | POA: Diagnosis not present

## 2021-06-17 DIAGNOSIS — Z7952 Long term (current) use of systemic steroids: Secondary | ICD-10-CM | POA: Diagnosis not present

## 2021-06-17 DIAGNOSIS — Z7951 Long term (current) use of inhaled steroids: Secondary | ICD-10-CM | POA: Diagnosis not present

## 2021-06-17 DIAGNOSIS — Z809 Family history of malignant neoplasm, unspecified: Secondary | ICD-10-CM | POA: Diagnosis not present

## 2021-06-17 DIAGNOSIS — I10 Essential (primary) hypertension: Secondary | ICD-10-CM | POA: Diagnosis not present

## 2021-06-17 DIAGNOSIS — I2510 Atherosclerotic heart disease of native coronary artery without angina pectoris: Secondary | ICD-10-CM

## 2021-06-17 DIAGNOSIS — N1831 Chronic kidney disease, stage 3a: Secondary | ICD-10-CM | POA: Diagnosis not present

## 2021-06-17 DIAGNOSIS — R0609 Other forms of dyspnea: Secondary | ICD-10-CM | POA: Diagnosis not present

## 2021-06-17 DIAGNOSIS — Z853 Personal history of malignant neoplasm of breast: Secondary | ICD-10-CM | POA: Diagnosis not present

## 2021-06-17 DIAGNOSIS — I4892 Unspecified atrial flutter: Secondary | ICD-10-CM | POA: Diagnosis not present

## 2021-06-17 DIAGNOSIS — E785 Hyperlipidemia, unspecified: Secondary | ICD-10-CM | POA: Diagnosis not present

## 2021-06-17 DIAGNOSIS — Z7901 Long term (current) use of anticoagulants: Secondary | ICD-10-CM | POA: Diagnosis not present

## 2021-06-17 DIAGNOSIS — R9431 Abnormal electrocardiogram [ECG] [EKG]: Secondary | ICD-10-CM | POA: Diagnosis not present

## 2021-06-17 DIAGNOSIS — R059 Cough, unspecified: Secondary | ICD-10-CM | POA: Diagnosis not present

## 2021-06-17 DIAGNOSIS — Z79899 Other long term (current) drug therapy: Secondary | ICD-10-CM | POA: Diagnosis not present

## 2021-06-17 DIAGNOSIS — E039 Hypothyroidism, unspecified: Secondary | ICD-10-CM | POA: Diagnosis not present

## 2021-06-17 DIAGNOSIS — F17200 Nicotine dependence, unspecified, uncomplicated: Secondary | ICD-10-CM | POA: Diagnosis not present

## 2021-06-17 DIAGNOSIS — J449 Chronic obstructive pulmonary disease, unspecified: Secondary | ICD-10-CM | POA: Diagnosis not present

## 2021-06-17 DIAGNOSIS — I4891 Unspecified atrial fibrillation: Secondary | ICD-10-CM | POA: Diagnosis not present

## 2021-06-17 DIAGNOSIS — R0602 Shortness of breath: Secondary | ICD-10-CM | POA: Diagnosis not present

## 2021-06-17 DIAGNOSIS — R778 Other specified abnormalities of plasma proteins: Secondary | ICD-10-CM | POA: Diagnosis not present

## 2021-06-17 DIAGNOSIS — E872 Acidosis, unspecified: Secondary | ICD-10-CM | POA: Diagnosis not present

## 2021-06-17 LAB — ALT: ALT: 29 U/L (ref 0–44)

## 2021-06-17 LAB — LIPID PANEL
Cholesterol: 129 mg/dL (ref 0–200)
HDL: 53 mg/dL (ref >40)
LDL Cholesterol: 58 mg/dL (ref 0–99)
Total CHOL/HDL Ratio: 2.4 RATIO
Triglycerides: 91 mg/dL (ref <150)
VLDL: 18 mg/dL (ref 0–40)

## 2021-06-17 NOTE — Patient Instructions (Signed)
Medication Instructions:  ?Decrease Metoprolol to 50 mg tablets once daily ? ?Labwork: ?Fasting Lipids ?ALT ? ?Follow-Up: ?Follow up with Dr. Radford Pax or APP in 6 months. ? ?Any Other Special Instructions Will Be Listed Below (If Applicable). ? ? ? ? ?If you need a refill on your cardiac medications before your next appointment, please call your pharmacy. ? ?

## 2021-06-17 NOTE — Addendum Note (Signed)
Addended by: Berlinda Last on: 06/17/2021 08:46 AM ? ? Modules accepted: Orders ? ?

## 2021-06-17 NOTE — Progress Notes (Signed)
? ? ?Cardiology Office Note ? ?Date: 06/17/2021  ? ?ID: Patricia Jackson, DOB 17-Sep-1940, MRN [id number] ? ?PCP:  Celene Squibb, MD  ?Cardiologist:  None ?Electrophysiologist:  None  ? ?Chief Complaint: Cardiac follow-up ? ?History of Present Illness: ?Patricia Jackson is a 81 y.o. female with a history of atrial fibrillation/atrial flutter/CAD, COPD, HTN. ? ?She is here today for followup and is doing well.  She recently had problems with anemia with Hbg as low as 7.7 and currently is being workup up by hematology.  Hbg on 06/12/21.   She has chronic DOE from her COPD which is stable.  She denies any chest pain or pressure, PND, orthopnea, LE edema, palpitations or syncope. She is compliant with her meds and is tolerating meds with no SE.    ? ?Past Medical History:  ?Diagnosis Date  ? A-fib ([place])   ? Atrial flutter ([place])   ? On Eliquis in 8/17 but discontinued after stent placement  ? Breast cancer ([place])   ? remote  ? CAD in native artery 03/16/2016  ? COPD (chronic obstructive pulmonary disease) ([place])   ? Hypertension   ? Iron deficiency anemia 06/12/2021  ? S/P angioplasty with stent 03/15/16 DES Resolute, pLCX 03/16/2016  ? Vitreous hemorrhage of left eye ([place]) 08/01/2019  ? ? ?Past Surgical History:  ?Procedure Laterality Date  ? APPENDECTOMY    ? BREAST SURGERY Left   ? CARDIAC CATHETERIZATION N/A 03/15/2016  ? Procedure: Left Heart Cath and Coronary Angiography;  Surgeon: Belva Crome, MD;  Location: [facility];  Service: Cardiovascular;  Laterality: N/A;  ? CARDIAC CATHETERIZATION N/A 03/15/2016  ? Procedure: Coronary Stent Intervention;  Surgeon: Belva Crome, MD;  Location: [facility];  Service: Cardiovascular;  Laterality: N/A;  ? COLONOSCOPY    ? remote  ? COLONOSCOPY N/A 05/26/2016  ? Procedure: COLONOSCOPY;  Surgeon: Daneil Dolin, MD;  [facility];  Service: Endoscopy;  Laterality: N/A;  845  ? ESOPHAGOGASTRODUODENOSCOPY N/A 05/26/2016  ? Procedure: ESOPHAGOGASTRODUODENOSCOPY  (EGD);  Surgeon: Daneil Dolin, MD;  [facility];  Service: Endoscopy;  Laterality: N/A;  ? MALONEY DILATION N/A 05/26/2016  ? Procedure: MALONEY DILATION;  Surgeon: Daneil Dolin, MD;  [facility];  Service: Endoscopy;  Laterality: N/A;  ? VITRECTOMY Left 10/03/2019  ? Dr. Zadie Rhine, Vitrectomy, Focal Laser, Removal of Silicone Oil  ? ? ?Current Outpatient Medications  ?Medication Sig Dispense Refill  ? acetaminophen (TYLENOL) 500 MG tablet Take 1,500 mg by mouth every 4 (four) hours as needed.    ? albuterol (PROVENTIL) (2.5 MG/3ML) 0.083% nebulizer solution Take 2.5 mg by nebulization every 6 (six) hours as needed for wheezing or shortness of breath.    ? albuterol (VENTOLIN HFA) 108 (90 Base) MCG/ACT inhaler Inhale 2 puffs into the lungs every 6 (six) hours as needed for wheezing or shortness of breath.    ? amLODipine-olmesartan (AZOR) 10-40 MG tablet Take 1 tablet by mouth every morning.    ? benzonatate (TESSALON) 100 MG capsule Take 1 capsule (100 mg total) by mouth every 8 (eight) hours. 21 capsule 0  ? buPROPion (WELLBUTRIN XL) 300 MG 24 hr tablet Take 300 mg by mouth daily.    ? Cholecalciferol (VITAMIN D3) 50 MCG (2000 UT) TABS Take 2,000 Units by mouth daily.    ? denosumab (PROLIA) 60 MG/ML SOSY injection Inject 60 mg into the skin every 6 (six) months.    ? ELIQUIS 5  MG TABS tablet TAKE ONE TABLET (5MG TOTAL) BY MOUTH TWOTIMES DAILY 60 tablet 6  ? ferrous sulfate 325 (65 FE) MG tablet Take 325 mg by mouth daily with breakfast.     ? fluvoxaMINE (LUVOX) 100 MG tablet Take 1 tablet (100 mg total) by mouth 2 (two) times daily. 20 tablet 0  ? levothyroxine (SYNTHROID) 50 MCG tablet Take 50 mcg by mouth daily before breakfast.     ? metoprolol succinate (TOPROL-XL) 25 MG 24 hr tablet Take 25 mg by mouth daily.    ? metoprolol succinate (TOPROL-XL) 50 MG 24 hr tablet Take 50 mg by mouth daily.    ? nitroGLYCERIN (NITROSTAT) 0.4 MG SL tablet Place 0.4 mg under the tongue every 5 (five)  minutes as needed for chest pain.    ? ofloxacin (OCUFLOX) 0.3 % ophthalmic solution ofloxacin 0.3 % eye drops ? Place one drop in the right eye four times daily    ? prednisoLONE acetate (PRED FORTE) 1 % ophthalmic suspension prednisolone acetate 1 % eye drops,suspension ? Place one drop in the right eye four times daily    ? predniSONE (DELTASONE) 20 MG tablet Take 2 tablets (40 mg total) by mouth daily. 10 tablet 0  ? rosuvastatin (CRESTOR) 20 MG tablet Take 20 mg by mouth daily.     ? sertraline (ZOLOFT) 25 MG tablet Take 25 mg by mouth daily.    ? TRELEGY ELLIPTA 200-62.5-25 MCG/INH AEPB Inhale 1 puff into the lungs daily.    ? vitamin B-12 (CYANOCOBALAMIN) 100 MCG tablet Vitamin B12    ? ?No current facility-administered medications for this visit.  ? ?Allergies:  Sulfa antibiotics and Sulfa antibiotics  ? ?Social History: The patient  reports that she quit smoking about 6 years ago. She has never used smokeless tobacco. She reports that she does not drink alcohol and does not use drugs.  ? ?Family History: The patient's family history includes COPD in her father; Cancer in her brother, brother, and sister; Heart disease in her mother; Hypertension in her son.  ? ?ROS:  Please see the history of present illness. Otherwise, complete review of systems is positive for none.  All other systems are reviewed and negative.  ? ?Physical Exam: ?VS:  BP (!) 102/56   Pulse 60   Ht 5' 6"  (1.676 m)   Wt 136 lb (61.7 kg)   SpO2 98%   BMI 21.95 kg/m? , BMI Body mass index is 21.95 kg/m?. ? ?Wt Readings from Last 3 Encounters:  ?06/17/21 136 lb (61.7 kg)  ?06/12/21 135 lb 14.4 oz (61.6 kg)  ?05/26/21 136 lb 0.4 oz (61.7 kg)  ?  ?GEN: Well nourished, well developed in no acute distress ?HEENT: Normal ?NECK: No JVD; No carotid bruits ?LYMPHATICS: No lymphadenopathy ?CARDIAC:RRR, no murmurs, rubs, gallops ?RESPIRATORY:  Clear to auscultation without rales, wheezing or rhonchi  ?ABDOMEN: Soft, non-tender,  non-distended ?MUSCULOSKELETAL:  No edema; No deformity  ?SKIN: Warm and dry ?NEUROLOGIC:  Alert and oriented x 3 ?PSYCHIATRIC:  Normal affect   ? ?ECG: EKG was not performed in the office ? ?Recent Labwork: ?05/26/2021: ALT 26; AST 41; BUN 25; Creatinine, Ser 1.50; Potassium 4.4; Sodium 137 ?06/12/2021: Hemoglobin 10.0; Platelets 138  ?   ?Component Value Date/Time  ? CHOL 159 03/13/2016 0324  ? TRIG 63 03/13/2016 0324  ? HDL 70 03/13/2016 0324  ? CHOLHDL 2.3 03/13/2016 0324  ? VLDL 13 03/13/2016 0324  ? [place] 76 03/13/2016 0324  ? ? ?Other Studies  Reviewed Today: ? ? ?Echocardiogram 08/27/2017 ?Study Conclusions  ? ?- Left ventricle: The cavity size was normal. Wall thickness was at  ?  the upper limits of normal. Systolic function was normal. The  ?  estimated ejection fraction was in the range of 60% to 65%. Wall  ?  motion was normal; there were no regional wall motion  ?  abnormalities. Doppler parameters are consistent with abnormal  ?  left ventricular relaxation (grade 1 diastolic dysfunction).  ?- Aortic valve: Mildly calcified annulus. Trileaflet.  ?- Mitral valve: Mildly calcified annulus. There was mild  ?  regurgitation.  ?- Right atrium: Central venous pressure (est): 3 mm Hg.  ?- Atrial septum: No defect or patent foramen ovale was identified.  ?- Tricuspid valve: There was trivial regurgitation.  ?- Pulmonary arteries: PA peak pressure: 29 mm Hg (S).  ?- Pericardium, extracardiac: There was no pericardial effusion.  ? ? ? ? ?  ?11/2013 Echo ?Study Conclusions ?- Left ventricle: The cavity size was normal. Wall thickness was at ?the upper limits of normal. Systolic function was vigorous. The ?estimated ejection fraction was in the range of 65% to 70%. Wall ?motion was normal; there were no regional wall motion ?abnormalities. Doppler parameters are consistent with abnormal ?left ventricular relaxation (grade 1 diastolic dysfunction). ?Doppler parameters are consistent with elevated  ventricular ?end-diastolic filling pressure. ?- Mitral valve: Calcified annulus. There was mild regurgitation. ?- Right atrium: Central venous pressure (est): 3 mm Hg. ?- Atrial septum: No defect or patent foramen ovale was identified. ?- Tricusp

## 2021-06-18 ENCOUNTER — Other Ambulatory Visit: Payer: Self-pay

## 2021-06-18 ENCOUNTER — Ambulatory Visit (HOSPITAL_COMMUNITY): Payer: Medicare Other

## 2021-06-18 ENCOUNTER — Encounter (HOSPITAL_COMMUNITY): Payer: Self-pay

## 2021-06-18 ENCOUNTER — Other Ambulatory Visit (HOSPITAL_COMMUNITY): Payer: Medicare Other

## 2021-06-18 ENCOUNTER — Emergency Department (HOSPITAL_COMMUNITY): Payer: Medicare Other

## 2021-06-18 ENCOUNTER — Inpatient Hospital Stay (HOSPITAL_COMMUNITY)
Admission: EM | Admit: 2021-06-18 | Discharge: 2021-06-22 | DRG: 948 | Disposition: A | Discharge status: Home Health | Payer: Medicare Other | Attending: Internal Medicine | Admitting: Internal Medicine

## 2021-06-18 DIAGNOSIS — F17200 Nicotine dependence, unspecified, uncomplicated: Secondary | ICD-10-CM | POA: Diagnosis present

## 2021-06-18 DIAGNOSIS — I4891 Unspecified atrial fibrillation: Secondary | ICD-10-CM | POA: Diagnosis present

## 2021-06-18 DIAGNOSIS — E785 Hyperlipidemia, unspecified: Secondary | ICD-10-CM | POA: Diagnosis present

## 2021-06-18 DIAGNOSIS — Z7951 Long term (current) use of inhaled steroids: Secondary | ICD-10-CM

## 2021-06-18 DIAGNOSIS — R0609 Other forms of dyspnea: Secondary | ICD-10-CM | POA: Diagnosis present

## 2021-06-18 DIAGNOSIS — Z79890 Hormone replacement therapy (postmenopausal): Secondary | ICD-10-CM

## 2021-06-18 DIAGNOSIS — E782 Mixed hyperlipidemia: Secondary | ICD-10-CM | POA: Diagnosis present

## 2021-06-18 DIAGNOSIS — Z853 Personal history of malignant neoplasm of breast: Secondary | ICD-10-CM

## 2021-06-18 DIAGNOSIS — R059 Cough, unspecified: Secondary | ICD-10-CM | POA: Diagnosis not present

## 2021-06-18 DIAGNOSIS — J449 Chronic obstructive pulmonary disease, unspecified: Secondary | ICD-10-CM | POA: Diagnosis present

## 2021-06-18 DIAGNOSIS — E872 Acidosis, unspecified: Secondary | ICD-10-CM | POA: Diagnosis present

## 2021-06-18 DIAGNOSIS — I129 Hypertensive chronic kidney disease with stage 1 through stage 4 chronic kidney disease, or unspecified chronic kidney disease: Secondary | ICD-10-CM | POA: Diagnosis present

## 2021-06-18 DIAGNOSIS — Z7952 Long term (current) use of systemic steroids: Secondary | ICD-10-CM

## 2021-06-18 DIAGNOSIS — Z20822 Contact with and (suspected) exposure to covid-19: Secondary | ICD-10-CM | POA: Diagnosis present

## 2021-06-18 DIAGNOSIS — Z79899 Other long term (current) drug therapy: Secondary | ICD-10-CM

## 2021-06-18 DIAGNOSIS — Z28310 Unvaccinated for covid-19: Secondary | ICD-10-CM

## 2021-06-18 DIAGNOSIS — I10 Essential (primary) hypertension: Secondary | ICD-10-CM | POA: Diagnosis present

## 2021-06-18 DIAGNOSIS — I2510 Atherosclerotic heart disease of native coronary artery without angina pectoris: Secondary | ICD-10-CM | POA: Diagnosis present

## 2021-06-18 DIAGNOSIS — Z7901 Long term (current) use of anticoagulants: Secondary | ICD-10-CM

## 2021-06-18 DIAGNOSIS — N1831 Chronic kidney disease, stage 3a: Secondary | ICD-10-CM | POA: Diagnosis present

## 2021-06-18 DIAGNOSIS — E039 Hypothyroidism, unspecified: Secondary | ICD-10-CM | POA: Diagnosis present

## 2021-06-18 DIAGNOSIS — Z8249 Family history of ischemic heart disease and other diseases of the circulatory system: Secondary | ICD-10-CM

## 2021-06-18 DIAGNOSIS — R778 Other specified abnormalities of plasma proteins: Principal | ICD-10-CM | POA: Diagnosis present

## 2021-06-18 DIAGNOSIS — Z809 Family history of malignant neoplasm, unspecified: Secondary | ICD-10-CM

## 2021-06-18 DIAGNOSIS — R7989 Other specified abnormal findings of blood chemistry: Secondary | ICD-10-CM | POA: Diagnosis present

## 2021-06-18 LAB — BASIC METABOLIC PANEL
Anion gap: 9 (ref 5–15)
BUN: 38 mg/dL — ABNORMAL HIGH (ref 8–23)
CO2: 18 mmol/L — ABNORMAL LOW (ref 22–32)
Calcium: 9.1 mg/dL (ref 8.9–10.3)
Chloride: 115 mmol/L — ABNORMAL HIGH (ref 98–111)
Creatinine, Ser: 1.39 mg/dL — ABNORMAL HIGH (ref 0.44–1.00)
GFR, Estimated: 39 mL/min — ABNORMAL LOW (ref >60)
Glucose, Bld: 98 mg/dL (ref 70–99)
Potassium: 4.6 mmol/L (ref 3.5–5.1)
Sodium: 142 mmol/L (ref 135–145)

## 2021-06-18 LAB — CBC
HCT: 35.5 % — ABNORMAL LOW (ref 36.0–46.0)
Hemoglobin: 10.5 g/dL — ABNORMAL LOW (ref 12.0–15.0)
MCH: 29.5 pg (ref 26.0–34.0)
MCHC: 29.6 g/dL — ABNORMAL LOW (ref 30.0–36.0)
MCV: 99.7 fL (ref 80.0–100.0)
Platelets: 137 10*3/uL — ABNORMAL LOW (ref 150–400)
RBC: 3.56 MIL/uL — ABNORMAL LOW (ref 3.87–5.11)
RDW: 14.7 % (ref 11.5–15.5)
WBC: 6.8 10*3/uL (ref 4.0–10.5)
nRBC: 0 % (ref 0.0–0.2)

## 2021-06-18 LAB — BRAIN NATRIURETIC PEPTIDE: B Natriuretic Peptide: 200 pg/mL — ABNORMAL HIGH (ref 0.0–100.0)

## 2021-06-18 LAB — RESP PANEL BY RT-PCR (FLU A&B, COVID) ARPGX2
Influenza A by PCR: NEGATIVE
Influenza B by PCR: NEGATIVE
SARS Coronavirus 2 by RT PCR: NEGATIVE

## 2021-06-18 LAB — TROPONIN I (HIGH SENSITIVITY): Troponin I (High Sensitivity): 46 ng/L — ABNORMAL HIGH (ref <18)

## 2021-06-18 MED ORDER — IPRATROPIUM-ALBUTEROL 0.5-2.5 (3) MG/3ML IN SOLN: 3.0000 mL | Freq: Once | RESPIRATORY_TRACT | Status: DC

## 2021-06-18 MED ORDER — METHYLPREDNISOLONE SODIUM SUCC 125 MG IJ SOLR
125.0000 mg | Freq: Once | INTRAMUSCULAR | Status: AC
  Administered 2021-06-18: 125 mg via INTRAVENOUS
  Filled 2021-06-18: qty 2

## 2021-06-18 NOTE — ED Triage Notes (Signed)
Pt presents with SOB, N/V/D, weakness that started yesterday. Pt has history of COPD. Pt grandson accompanies pt and he just got over COVID. Oxygen 100% in triage.  ?

## 2021-06-18 NOTE — ED Provider Notes (Signed)
[facility] EMERGENCY DEPARTMENT Provider Note   CSN: [id number] Arrival date & time: 06/18/21  2052     History  Chief Complaint  Patient presents with   Shortness of Breath   Nausea   Emesis    Quinley Jesko is a 81 y.o. female.   Shortness of Breath Associated symptoms: vomiting   Emesis  This patient is a 81 year old female, she has a known history of COPD as well as hypertension.  She presents to the hospital today with a complaint of shortness of breath which started today.  It is associated with an episode of vomiting and diarrhea earlier in the day and she has had exposure to her grandson who was positive for COVID last week.  Her oxygen is 100%, she does not have supplemental oxygen that she uses at home, the oxygen or the shortness of breath seems to get worse when she stands up and tries to move around.  She does not have any chest pain she does not have any swelling in her legs and she has not had fevers or chills.  She is having the occasional cough, no significant phlegm  Home Medications Prior to Admission medications   Medication Sig Start Date End Date Taking? Authorizing Provider  acetaminophen (TYLENOL) 500 MG tablet Take 1,500 mg by mouth every 4 (four) hours as needed.    [provider]  albuterol (PROVENTIL) (2.5 MG/3ML) 0.083% nebulizer solution Take 2.5 mg by nebulization every 6 (six) hours as needed for wheezing or shortness of breath.    [provider]  albuterol (VENTOLIN HFA) 108 (90 Base) MCG/ACT inhaler Inhale 2 puffs into the lungs every 6 (six) hours as needed for wheezing or shortness of breath.    [provider]  amLODipine-olmesartan (AZOR) 10-40 MG tablet Take 1 tablet by mouth every morning. 10/03/19   [provider]  benzonatate (TESSALON) 100 MG capsule Take 1 capsule (100 mg total) by mouth every 8 (eight) hours. 01/21/21   Eber Hong, MD  buPROPion (WELLBUTRIN XL) 300 MG 24 hr tablet Take 300 mg by  mouth daily. 11/05/19   [provider]  Cholecalciferol (VITAMIN D3) 50 MCG (2000 UT) TABS Take 2,000 Units by mouth daily.    [provider]  denosumab (PROLIA) 60 MG/ML SOSY injection Inject 60 mg into the skin every 6 (six) months.    [provider]  ELIQUIS 5 MG TABS tablet TAKE ONE TABLET (5MG  TOTAL) BY MOUTH TWOTIMES DAILY 11/10/20   Antoine Poche, MD  ferrous sulfate 325 (65 FE) MG tablet Take 325 mg by mouth daily with breakfast.  04/08/16   [provider]  fluvoxaMINE (LUVOX) 100 MG tablet Take 1 tablet (100 mg total) by mouth 2 (two) times daily. 04/28/20   Dione Booze, MD  levothyroxine (SYNTHROID) 50 MCG tablet Take 50 mcg by mouth daily before breakfast.  09/20/19   [provider]  metoprolol succinate (TOPROL-XL) 50 MG 24 hr tablet Take 50 mg by mouth daily. 10/15/19   [provider]  nitroGLYCERIN (NITROSTAT) 0.4 MG SL tablet Place 0.4 mg under the tongue every 5 (five) minutes as needed for chest pain. 06/09/19   [provider]  ofloxacin (OCUFLOX) 0.3 % ophthalmic solution ofloxacin 0.3 % eye drops  Place one drop in the right eye four times daily    [provider]  prednisoLONE acetate (PRED FORTE) 1 % ophthalmic suspension prednisolone acetate 1 % eye drops,suspension  Place one drop in the  right eye four times daily    [provider]  predniSONE (DELTASONE) 20 MG tablet Take 2 tablets (40 mg total) by mouth daily. 01/21/21   Eber Hong, MD  rosuvastatin (CRESTOR) 20 MG tablet Take 20 mg by mouth daily.  02/26/17   [provider]  sertraline (ZOLOFT) 25 MG tablet Take 25 mg by mouth daily.    [provider]  [name] 200-62.5-25 MCG/INH AEPB Inhale 1 puff into the lungs daily. 09/29/20   [provider]  vitamin B-12 (CYANOCOBALAMIN) 100 MCG tablet Vitamin B12    [provider]      Allergies    Sulfa antibiotics and Sulfa antibiotics    Review  of Systems   Review of Systems  Respiratory:  Positive for shortness of breath.   Gastrointestinal:  Positive for vomiting.  All other systems reviewed and are negative.  Physical Exam Updated Vital Signs BP 107/64   Pulse 69   Temp (!) 97.5 F (36.4 C) (Oral)   Resp 13   Ht 1.676 m (5\' 6" )   Wt 61.7 kg   SpO2 100%   BMI 21.95 kg/m  Physical Exam Vitals and nursing note reviewed.  Constitutional:      General: She is not in acute distress.    Appearance: She is well-developed.  HENT:     Head: Normocephalic and atraumatic.     Mouth/Throat:     Pharynx: No oropharyngeal exudate.  Eyes:     General: No scleral icterus.       Right eye: No discharge.        Left eye: No discharge.     Conjunctiva/sclera: Conjunctivae normal.     Pupils: Pupils are equal, round, and reactive to light.  Neck:     Thyroid: No thyromegaly.     Vascular: No JVD.  Cardiovascular:     Rate and Rhythm: Normal rate and regular rhythm.     Heart sounds: Normal heart sounds. No murmur heard.   No friction rub. No gallop.  Pulmonary:     Effort: Pulmonary effort is normal. No respiratory distress.     Breath sounds: Normal breath sounds. No wheezing or rales.  Abdominal:     General: Bowel sounds are normal. There is no distension.     Palpations: Abdomen is soft. There is no mass.     Tenderness: There is no abdominal tenderness.  Musculoskeletal:        General: No tenderness. Normal range of motion.     Cervical back: Normal range of motion and neck supple.  Lymphadenopathy:     Cervical: No cervical adenopathy.  Skin:    General: Skin is warm and dry.     Findings: No erythema or rash.  Neurological:     Mental Status: She is alert.     Coordination: Coordination normal.  Psychiatric:        Behavior: Behavior normal.    ED Results / Procedures / Treatments   Labs (all labs ordered are listed, but only abnormal results are displayed) Labs Reviewed  BASIC METABOLIC PANEL -  Abnormal; Notable for the following components:      Result Value   Chloride 115 (*)    CO2 18 (*)    BUN 38 (*)    Creatinine, Ser 1.39 (*)    GFR, Estimated 39 (*)    All other components within normal limits  CBC - Abnormal; Notable for the following components:   RBC 3.56 (*)  Hemoglobin 10.5 (*)    HCT 35.5 (*)    MCHC 29.6 (*)    Platelets 137 (*)    All other components within normal limits  TROPONIN I (HIGH SENSITIVITY) - Abnormal; Notable for the following components:   Troponin I (High Sensitivity) 46 (*)    All other components within normal limits  RESP PANEL BY RT-PCR (FLU A&B, COVID) ARPGX2  BRAIN NATRIURETIC PEPTIDE    EKG EKG Interpretation  Date/Time:  Thursday June 18 2021 22:20:29 EDT Ventricular Rate:  66 PR Interval:  168 QRS Duration: 88 QT Interval:  423 QTC Calculation: 444 R Axis:   78 Text Interpretation: Sinus rhythm since last tracing no significant change Confirmed by Eber Hong ([id number]) on 06/18/2021 11:07:12 PM  Radiology DG Chest Port 1 View  Result Date: 06/18/2021 CLINICAL DATA:  Cough.  Recent COVID exposure. EXAM: PORTABLE CHEST 1 VIEW COMPARISON:  Chest x-ray 07/06/2019 FINDINGS: There is some scarring in the lung bases. There is no focal lung infiltrate, pleural effusion or pneumothorax. Left axillary surgical clips are present. The cardiomediastinal silhouette is within normal limits. No acute fractures are seen. IMPRESSION: No active disease. Electronically Signed   By: Darliss Cheney M.D.   On: 06/18/2021 22:17    Procedures Procedures    Medications Ordered in ED Medications  ipratropium-albuterol (DUONEB) 0.5-2.5 (3) MG/3ML nebulizer solution 3 mL (has no administration in time range)  methylPREDNISolone sodium succinate (SOLU-MEDROL) 125 mg/2 mL injection 125 mg (has no administration in time range)    ED Course/ Medical Decision Making/ A&P                           Medical Decision Making Amount and/or Complexity of  Data Reviewed Labs: ordered. Radiology: ordered.   This patient presents to the ED for concern of shortness of breath, this involves an extensive number of treatment options, and is a complaint that carries with it a high risk of complications and morbidity.  The differential diagnosis includes COVID-19, influenza, pneumonia, COPD exacerbation, less likely be pulmonary embolism, she is not tachycardic nor is she hypoxic   Co morbidities that complicate the patient evaluation  COPD   Additional history obtained:  Additional history obtained from family member as well as the medical record External records from outside source obtained and reviewed including being seen yesterday in the office of the cardiologist because of history of coronary disease.  She also has a history of normocytic anemia.  She has not had any recent admissions to the hospital, she has been seen several times over the last couple years with viral upper respiratory illnesses in the emergency department however it has been quite sometime since her last admission to the hospital.  This was actually in 2019 when she was admitted for COPD in February 2019.   Lab Tests:  I Ordered, and personally interpreted labs.  The pertinent results include: Unfortunately the troponin is elevated, CBC and metabolic panel are overall unremarkable, BNP added   Imaging Studies ordered:  I ordered imaging studies including chest x-ray I independently visualized and interpreted imaging which showed no acute infiltrate I agree with the radiologist interpretation   Cardiac Monitoring:  The patient was maintained on a cardiac monitor.  I personally viewed and interpreted the cardiac monitored which showed an underlying rhythm of: Sinus rhythm   Medicines ordered and prescription drug management:  I ordered medication including albuterol, Solu-Medrol for shortness of breath Reevaluation of  the patient after these medicines showed that  the patient improved I have reviewed the patients home medicines and have made adjustments as needed   Test Considered:  CT scan of the chest but the patient is on Eliquis making PE less likely   Critical Interventions:  Evaluation for cardiac causes   Consultations Obtained:  At the time of change of shift I recommended that the patient be admitted to the hospital for dyspnea on exertion whether this is cardiac or some other cause.  The patient is on Eliquis, initial troponin is slightly elevated, second troponin pending at time of change of shift, appreciate Dr. Oletta Cohn to follow-up results and disposition accordingly          Final Clinical Impression(s) / ED Diagnoses Final diagnoses:  None    Rx / DC Orders ED Discharge Orders     None         Eber Hong, MD 06/18/21 2321

## 2021-06-19 ENCOUNTER — Ambulatory Visit (HOSPITAL_COMMUNITY): Payer: Medicare Other

## 2021-06-19 ENCOUNTER — Encounter (HOSPITAL_COMMUNITY): Payer: Self-pay | Admitting: Family Medicine

## 2021-06-19 ENCOUNTER — Observation Stay (HOSPITAL_COMMUNITY): Payer: Medicare Other

## 2021-06-19 ENCOUNTER — Other Ambulatory Visit: Payer: Self-pay

## 2021-06-19 DIAGNOSIS — R778 Other specified abnormalities of plasma proteins: Principal | ICD-10-CM

## 2021-06-19 DIAGNOSIS — J449 Chronic obstructive pulmonary disease, unspecified: Secondary | ICD-10-CM | POA: Diagnosis not present

## 2021-06-19 DIAGNOSIS — E785 Hyperlipidemia, unspecified: Secondary | ICD-10-CM | POA: Diagnosis not present

## 2021-06-19 DIAGNOSIS — R0609 Other forms of dyspnea: Secondary | ICD-10-CM | POA: Diagnosis not present

## 2021-06-19 DIAGNOSIS — R0602 Shortness of breath: Secondary | ICD-10-CM | POA: Diagnosis not present

## 2021-06-19 DIAGNOSIS — E039 Hypothyroidism, unspecified: Secondary | ICD-10-CM

## 2021-06-19 DIAGNOSIS — I10 Essential (primary) hypertension: Secondary | ICD-10-CM

## 2021-06-19 LAB — CBC
HCT: 35.8 % — ABNORMAL LOW (ref 36.0–46.0)
Hemoglobin: 10.4 g/dL — ABNORMAL LOW (ref 12.0–15.0)
MCH: 29.5 pg (ref 26.0–34.0)
MCHC: 29.1 g/dL — ABNORMAL LOW (ref 30.0–36.0)
MCV: 101.7 fL — ABNORMAL HIGH (ref 80.0–100.0)
Platelets: 123 10*3/uL — ABNORMAL LOW (ref 150–400)
RBC: 3.52 MIL/uL — ABNORMAL LOW (ref 3.87–5.11)
RDW: 14.9 % (ref 11.5–15.5)
WBC: 5.6 10*3/uL (ref 4.0–10.5)
nRBC: 0 % (ref 0.0–0.2)

## 2021-06-19 LAB — COMPREHENSIVE METABOLIC PANEL
ALT: 20 U/L (ref 0–44)
AST: 29 U/L (ref 15–41)
Albumin: 3.8 g/dL (ref 3.5–5.0)
Alkaline Phosphatase: 54 U/L (ref 38–126)
Anion gap: 10 (ref 5–15)
BUN: 37 mg/dL — ABNORMAL HIGH (ref 8–23)
CO2: 18 mmol/L — ABNORMAL LOW (ref 22–32)
Calcium: 9.1 mg/dL (ref 8.9–10.3)
Chloride: 115 mmol/L — ABNORMAL HIGH (ref 98–111)
Creatinine, Ser: 1.16 mg/dL — ABNORMAL HIGH (ref 0.44–1.00)
GFR, Estimated: 48 mL/min — ABNORMAL LOW (ref >60)
Glucose, Bld: 125 mg/dL — ABNORMAL HIGH (ref 70–99)
Potassium: 4.7 mmol/L (ref 3.5–5.1)
Sodium: 143 mmol/L (ref 135–145)
Total Bilirubin: 0.5 mg/dL (ref 0.3–1.2)
Total Protein: 6.5 g/dL (ref 6.5–8.1)

## 2021-06-19 LAB — ECHOCARDIOGRAM COMPLETE
AR max vel: 2.64 cm2
AV Area VTI: 2.56 cm2
AV Area mean vel: 2.63 cm2
AV Mean grad: 3 mmHg
AV Peak grad: 6.6 mmHg
Ao pk vel: 1.28 m/s
Area-P 1/2: 3.24 cm2
Calc EF: 66.5 %
Height: 66 in
MV VTI: 1.85 cm2
S' Lateral: 3 cm
Single Plane A2C EF: 71 %
Single Plane A4C EF: 58.4 %
Weight: 2176 oz

## 2021-06-19 LAB — BLOOD GAS, VENOUS
Acid-base deficit: 7.5 mmol/L — ABNORMAL HIGH (ref 0.0–2.0)
Bicarbonate: 18.2 mmol/L — ABNORMAL LOW (ref 20.0–28.0)
Drawn by: 6643
FIO2: 21 %
O2 Saturation: 28.5 %
Patient temperature: 36.4
pCO2, Ven: 36 mmHg — ABNORMAL LOW (ref 44–60)
pH, Ven: 7.31 (ref 7.25–7.43)
pO2, Ven: <31 mmHg — CL (ref 32–45)

## 2021-06-19 LAB — TROPONIN I (HIGH SENSITIVITY)
Troponin I (High Sensitivity): 40 ng/L — ABNORMAL HIGH (ref <18)
Troponin I (High Sensitivity): 32 ng/L — ABNORMAL HIGH (ref <18)

## 2021-06-19 LAB — MAGNESIUM: Magnesium: 2.2 mg/dL (ref 1.7–2.4)

## 2021-06-19 MED ORDER — METOPROLOL SUCCINATE ER 50 MG PO TB24
50.0000 mg | ORAL_TABLET | Freq: Every day | ORAL | Status: DC
  Administered 2021-06-19 – 2021-06-22 (×4): 50 mg via ORAL
  Filled 2021-06-19 (×4): qty 1

## 2021-06-19 MED ORDER — LORATADINE 10 MG PO TABS
10.0000 mg | ORAL_TABLET | Freq: Every day | ORAL | Status: DC
  Administered 2021-06-19 – 2021-06-22 (×4): 10 mg via ORAL
  Filled 2021-06-19 (×4): qty 1

## 2021-06-19 MED ORDER — FLUTICASONE PROPIONATE 50 MCG/ACT NA SUSP
2.0000 | Freq: Every day | NASAL | Status: DC
  Administered 2021-06-19 – 2021-06-22 (×3): 2 via NASAL
  Filled 2021-06-19 (×2): qty 16

## 2021-06-19 MED ORDER — AMLODIPINE BESYLATE 5 MG PO TABS: 10.0000 mg | ORAL_TABLET | Freq: Every day | ORAL | Status: DC

## 2021-06-19 MED ORDER — APIXABAN 5 MG PO TABS
5.0000 mg | ORAL_TABLET | Freq: Two times a day (BID) | ORAL | Status: DC
  Administered 2021-06-19 – 2021-06-22 (×7): 5 mg via ORAL
  Filled 2021-06-19 (×7): qty 1

## 2021-06-19 MED ORDER — LEVOTHYROXINE SODIUM 50 MCG PO TABS
50.0000 ug | ORAL_TABLET | Freq: Every day | ORAL | Status: DC
  Administered 2021-06-19 – 2021-06-22 (×4): 50 ug via ORAL
  Filled 2021-06-19 (×4): qty 1

## 2021-06-19 MED ORDER — IRBESARTAN 150 MG PO TABS: 300.0000 mg | ORAL_TABLET | Freq: Every day | ORAL | Status: DC

## 2021-06-19 MED ORDER — AMLODIPINE-OLMESARTAN 10-40 MG PO TABS: 1.0000 | ORAL_TABLET | Freq: Every morning | ORAL | Status: DC

## 2021-06-19 MED ORDER — BUPROPION HCL ER (XL) 300 MG PO TB24
300.0000 mg | ORAL_TABLET | Freq: Every day | ORAL | Status: DC
  Administered 2021-06-19 – 2021-06-22 (×4): 300 mg via ORAL
  Filled 2021-06-19 (×3): qty 1
  Filled 2021-06-19: qty 2

## 2021-06-19 MED ORDER — ROSUVASTATIN CALCIUM 20 MG PO TABS
20.0000 mg | ORAL_TABLET | Freq: Every day | ORAL | Status: DC
  Administered 2021-06-19 – 2021-06-22 (×4): 20 mg via ORAL
  Filled 2021-06-19 (×4): qty 1

## 2021-06-19 MED ORDER — ACETAMINOPHEN 325 MG PO TABS
650.0000 mg | ORAL_TABLET | ORAL | Status: DC | PRN
  Administered 2021-06-20 – 2021-06-21 (×2): 650 mg via ORAL
  Filled 2021-06-19 (×2): qty 2

## 2021-06-19 MED ORDER — SODIUM CHLORIDE 0.9 % IV BOLUS
500.0000 mL | Freq: Once | INTRAVENOUS | Status: AC
  Administered 2021-06-19: 500 mL via INTRAVENOUS

## 2021-06-19 MED ORDER — NICOTINE 21 MG/24HR TD PT24
21.0000 mg | MEDICATED_PATCH | Freq: Every day | TRANSDERMAL | Status: DC
  Filled 2021-06-19 (×4): qty 1

## 2021-06-19 MED ORDER — IPRATROPIUM-ALBUTEROL 0.5-2.5 (3) MG/3ML IN SOLN: 3.0000 mL | RESPIRATORY_TRACT | Status: DC | PRN

## 2021-06-19 MED ORDER — ONDANSETRON HCL 4 MG/2ML IJ SOLN: 4.0000 mg | Freq: Four times a day (QID) | INTRAMUSCULAR | Status: DC | PRN

## 2021-06-19 MED ORDER — FLUTICASONE FUROATE-VILANTEROL 200-25 MCG/ACT IN AEPB
1.0000 | INHALATION_SPRAY | Freq: Every day | RESPIRATORY_TRACT | Status: DC
  Administered 2021-06-20 – 2021-06-22 (×3): 1 via RESPIRATORY_TRACT
  Filled 2021-06-19: qty 28

## 2021-06-19 MED ORDER — UMECLIDINIUM BROMIDE 62.5 MCG/ACT IN AEPB
1.0000 | INHALATION_SPRAY | Freq: Every day | RESPIRATORY_TRACT | Status: DC
  Administered 2021-06-20 – 2021-06-22 (×3): 1 via RESPIRATORY_TRACT
  Filled 2021-06-19: qty 7

## 2021-06-19 MED ORDER — SERTRALINE HCL 50 MG PO TABS
25.0000 mg | ORAL_TABLET | Freq: Every day | ORAL | Status: DC
  Administered 2021-06-19 – 2021-06-22 (×4): 25 mg via ORAL
  Filled 2021-06-19 (×4): qty 1

## 2021-06-19 MED ORDER — SODIUM CHLORIDE 0.9 % IV SOLN: INTRAVENOUS | Status: DC

## 2021-06-19 NOTE — Assessment & Plan Note (Addendum)
Notable sick contacts, COVID-negative ?Chest x-ray without active disease ?Low concern for PE given as patient is on Eliquis and compliant. ?Troponin elevated but downtrending-less likely cardiac etiology. ?Possibly a non-COVID virus in the community ?-Respiratory viral panel ordered however not collected as was still pending by day of discharge.   ?-Patient was hydrated IV fluid, placed on supportive care and maintained on Claritin, Flonase, Breo Ellipta, as needed nebs. ?-Patient improved clinically with improvement with dyspnea on exertion. ?-Patient was discharged home in stable and improved condition. ?-Outpatient follow-up with PCP. ?

## 2021-06-19 NOTE — Progress Notes (Signed)
*  PRELIMINARY RESULTS* ?Echocardiogram ?2D Echocardiogram has been performed. ? ?Patricia Jackson ?06/19/2021, 11:36 AM ?

## 2021-06-19 NOTE — Hospital Course (Signed)
Patient 81 year old female history of A-fib on chronic anticoagulation, CAD, COPD, hypertension, presenting to the ED with complaint of 1 episode of diarrhea and dyspnea on minimal exertion.  Patient noted to admitting physician that everybody in her family and had a recent virus.  Patient on day of admission had an episode of diarrhea, dry heaves, felt short of breath became dizzy every time she got up.  Patient felt she had increased oral intake without any relief.  Patient denies any chest pain, no palpitations admitted to a chronic cough.  Patient seen in the ED COVID-19 PCR done was negative.  Patient noted to have elevated troponin which was trending down.  Patient also noted to have soft blood pressure with systolics in the 04H.  Patient placed on IV fluids.  Patient admitted for further evaluation and management. ?

## 2021-06-19 NOTE — Assessment & Plan Note (Addendum)
-  Patient with soft blood pressure on admission with systolics in the 25O.   ?-Patient's home regimen amlodipine and olmesartan were held during the hospitalization patient maintained on home regimen of metoprolol. ?-Patient hydrated with IV fluids with resolution of soft blood pressure. ?-Antihypertensive medications will be resumed on discharge. ?

## 2021-06-19 NOTE — Progress Notes (Signed)
Went to assess patient.  Patient's CXR shows no active disease process.  She is not on any O2, on RA with sat in 90s and has no wheeze.  Patient does not feel that she needs a treatment at this time.  Patient does have COPD and took her morning meds for that.  Patient does state that she feels SOB and gets dizzy when she stands.  Will put in for PRN if patient needs it while here. ?

## 2021-06-19 NOTE — Assessment & Plan Note (Addendum)
Patient maintained on home regimen Trelegy ?VBG is not remarkable. ?

## 2021-06-19 NOTE — ED Provider Notes (Signed)
Signed out by Dr. Sabra Heck to follow-up on labs.  Patient with exertional dyspnea, no shortness of breath or hypoxia at rest.  First troponin slightly elevated.  Second troponin has returned and is plateaued.  Unclear if this is anginal equivalent, no clear pulmonary pathology.  Does not appear to be overtly in congestive heart failure.  She is already on Xarelto because of paroxysmal atrial fibrillation, has not missed any doses.  PE felt to be less likely.  Will admit to hospital for further work-up. ?  ?Orpah Greek, MD ?06/19/21 [id number] ? ?

## 2021-06-19 NOTE — Assessment & Plan Note (Addendum)
Patient maintained on home regimen statin. ?

## 2021-06-19 NOTE — Assessment & Plan Note (Addendum)
-  Patient maintained on home regimen Synthroid. ?

## 2021-06-19 NOTE — TOC Progression Note (Signed)
?  Transition of Care (TOC) Screening Note ? ? ?Patient Details  ?Name: Patricia Jackson ?Date of Birth: 1940/10/26 ? ? ?Transition of Care (TOC) CM/SW Contact:    ?Shade Flood, LCSW ?Phone Number: ?06/19/2021, 11:16 AM ? ? ? ?Transition of Care Department [facility]) has reviewed patient and no TOC needs have been identified at this time. We will continue to monitor patient advancement through interdisciplinary progression rounds. If new patient transition needs arise, please place a TOC consult. ? ? ?

## 2021-06-19 NOTE — ED Notes (Signed)
Pt inhalers requested from pharmacy, not located in pyxis.  ?

## 2021-06-19 NOTE — Assessment & Plan Note (Addendum)
Trended down, 46, 40, 32 ?EKG is without ischemic changes ?BNP at baseline 200 ?Chest x-ray shows no active disease ?Given the troponin was trending down and patient has not had chest pain-no heparin drip started ?Minimal troponin elevation in the setting of CKD with creatinine 1.39 ?-2D echo done with a EF of 60 to 65%,NWMA, mild LVH, normal right ventricular systolic function, mildly elevated pulmonary arterial systolic pressure, mild MVR, moderate mitral stenosis, abnormal tricuspid valve. ?-Case discussed with cardiology who reviewed 2D echo, reviewed chart in agreement that EKG normal with flat troponins not consistent with ACS.  Per cardiology no further cardiac work-up needed at this time if patient is not volume overloaded on examination. ?-Patient remained asymptomatic. ?-Outpatient follow-up. ?

## 2021-06-19 NOTE — H&P (Signed)
?History and Physical  ? ? ?Patient: Patricia Jackson FOY:[id number] DOB: 1940/06/16 ?DOA: 06/18/2021 ?DOS: the patient was seen and examined on 06/19/2021 ?PCP: Celene Squibb, MD  ?Patient coming from: Home ? ?Chief Complaint:  ?Chief Complaint  ?Patient presents with  ? Shortness of Breath  ? Nausea  ? Emesis  ? ?HPI: Patricia Jackson is a 81 y.o. female with medical history significant of atrial fibrillation, coronary artery disease, COPD, hypertension, and more presents ED with chief complaint of diarrhea and dyspnea.  Patient reports that everybody in her family has recently had a virus.  Patient reports that she only had 1 episode of diarrhea and dry heaves, but today she felt short of breath.  Patient reports that she became dizzy and out of breath every time she got up.  She feels like she has increased her p.o. fluid intake without relief.  She has had no actual vomiting.  Her diarrheal episode was x1 with no blood or melena.  Patient reports sometimes she does have melena but not today.  She denies any chest pains, palpitations.  She admits to chronic cough.  Patient also reports that her hemoglobin was just recently 12, and today is 10.5.  We will continue to trend this.  Patient reports a couple of years ago she required a blood transfusion.  She has not noticed any bleeding, but she is on Eliquis.  Patient reports she has not passed out or fallen down.  She has no other complaints at this time. ? ?The current smoker, does not drink, does not use illicit drugs, is not vaccinated for COVID.  Patient is full code. ?Review of Systems: As mentioned in the history of present illness. All other systems reviewed and are negative. ?Past Medical History:  ?Diagnosis Date  ? A-fib ([place])   ? Atrial flutter ([place])   ? On Eliquis in 8/17 but discontinued after stent placement  ? Breast cancer ([place])   ? remote  ? CAD in native artery 03/16/2016  ? COPD (chronic obstructive pulmonary disease) ([place])   ? Hypertension   ? Iron  deficiency anemia 06/12/2021  ? S/P angioplasty with stent 03/15/16 DES Resolute, pLCX 03/16/2016  ? Vitreous hemorrhage of left eye ([place]) 08/01/2019  ? ?Past Surgical History:  ?Procedure Laterality Date  ? APPENDECTOMY    ? BREAST SURGERY Left   ? CARDIAC CATHETERIZATION N/A 03/15/2016  ? Procedure: Left Heart Cath and Coronary Angiography;  Surgeon: Belva Crome, MD;  Location: [facility];  Service: Cardiovascular;  Laterality: N/A;  ? CARDIAC CATHETERIZATION N/A 03/15/2016  ? Procedure: Coronary Stent Intervention;  Surgeon: Belva Crome, MD;  Location: [facility];  Service: Cardiovascular;  Laterality: N/A;  ? COLONOSCOPY    ? remote  ? COLONOSCOPY N/A 05/26/2016  ? Procedure: COLONOSCOPY;  Surgeon: Daneil Dolin, MD;  [facility];  Service: Endoscopy;  Laterality: N/A;  845  ? ESOPHAGOGASTRODUODENOSCOPY N/A 05/26/2016  ? Procedure: ESOPHAGOGASTRODUODENOSCOPY (EGD);  Surgeon: Daneil Dolin, MD;  [facility];  Service: Endoscopy;  Laterality: N/A;  ? MALONEY DILATION N/A 05/26/2016  ? Procedure: MALONEY DILATION;  Surgeon: Daneil Dolin, MD;  [facility];  Service: Endoscopy;  Laterality: N/A;  ? VITRECTOMY Left 10/03/2019  ? Dr. Zadie Rhine, Vitrectomy, Focal Laser, Removal of Silicone Oil  ? ?Social History:  reports that she quit smoking about 6 years ago. She has never used smokeless tobacco. She reports that she does not drink  alcohol and does not use drugs. ? ?Allergies  ?Allergen Reactions  ? Sulfa Antibiotics Hives  ? Sulfa Antibiotics Hives and Other (See Comments)  ?  Reaction not recalled by the patient, but "hives" were noted in her other profile in Epic  ? ? ?Family History  ?Problem Relation Age of Onset  ? Heart disease Mother   ? COPD Father   ? Cancer Sister   ?     unknown primary  ? Cancer Brother   ?     unknown primary  ? Cancer Brother   ? Hypertension Son   ? Colon cancer Neg Hx   ? ? ?Prior to Admission medications   ?Medication Sig Start  Date End Date Taking? Authorizing Provider  ?acetaminophen (TYLENOL) 500 MG tablet Take 1,500 mg by mouth every 4 (four) hours as needed.    [provider]  ?albuterol (PROVENTIL) (2.5 MG/3ML) 0.083% nebulizer solution Take 2.5 mg by nebulization every 6 (six) hours as needed for wheezing or shortness of breath.    [provider]  ?albuterol (VENTOLIN HFA) 108 (90 Base) MCG/ACT inhaler Inhale 2 puffs into the lungs every 6 (six) hours as needed for wheezing or shortness of breath.    [provider]  ?amLODipine-olmesartan (AZOR) 10-40 MG tablet Take 1 tablet by mouth every morning. 10/03/19   [provider]  ?benzonatate (TESSALON) 100 MG capsule Take 1 capsule (100 mg total) by mouth every 8 (eight) hours. 01/21/21   Noemi Chapel, MD  ?buPROPion (WELLBUTRIN XL) 300 MG 24 hr tablet Take 300 mg by mouth daily. 11/05/19   [provider]  ?Cholecalciferol (VITAMIN D3) 50 MCG (2000 UT) TABS Take 2,000 Units by mouth daily.    [provider]  ?denosumab (PROLIA) 60 MG/ML SOSY injection Inject 60 mg into the skin every 6 (six) months.    [provider]  ?ELIQUIS 5 MG TABS tablet TAKE ONE TABLET (5MG TOTAL) BY MOUTH TWOTIMES DAILY 11/10/20   Arnoldo Lenis, MD  ?ferrous sulfate 325 (65 FE) MG tablet Take 325 mg by mouth daily with breakfast.  04/08/16   [provider]  ?fluvoxaMINE (LUVOX) 100 MG tablet Take 1 tablet (100 mg total) by mouth 2 (two) times daily. 3/00/92   Delora Fuel, MD  ?levothyroxine (SYNTHROID) 50 MCG tablet Take 50 mcg by mouth daily before breakfast.  09/20/19   [provider]  ?metoprolol succinate (TOPROL-XL) 50 MG 24 hr tablet Take 50 mg by mouth daily. 10/15/19   [provider]  ?nitroGLYCERIN (NITROSTAT) 0.4 MG SL tablet Place 0.4 mg under the tongue every 5 (five) minutes as needed for chest pain. 06/09/19   [provider]  ?ofloxacin (OCUFLOX) 0.3 % ophthalmic solution ofloxacin 0.3 % eye  drops ? Place one drop in the right eye four times daily    [provider]  ?prednisoLONE acetate (PRED FORTE) 1 % ophthalmic suspension prednisolone acetate 1 % eye drops,suspension ? Place one drop in the right eye four times daily    [provider]  ?predniSONE (DELTASONE) 20 MG tablet Take 2 tablets (40 mg total) by mouth daily. 01/21/21   Noemi Chapel, MD  ?rosuvastatin (CRESTOR) 20 MG tablet Take 20 mg by mouth daily.  02/26/17   [provider]  ?sertraline (ZOLOFT) 25 MG tablet Take 25 mg by mouth daily.    [provider]  ?Donnal Debar 200-62.5-25 MCG/INH AEPB Inhale 1 puff into the lungs daily. 09/29/20   [provider]  ?vitamin B-12 (CYANOCOBALAMIN) 100 MCG tablet Vitamin B12    [provider]  ? ? ?Physical Exam: ?Vitals:  ? 06/18/21 2330 06/19/21 0030 06/19/21 0130 06/19/21 0230  ?BP: 111/62 (!) 141/73 119/65 121/73  ?Pulse: 66 63 66 66  ?Resp: 18 16 16 19   ?Temp:      ?TempSrc:      ?SpO2: 100% 100% 100% 99%  ?Weight:      ?Height:      ? ?1.  General: ?Patient lying supine in bed,  no acute distress ?  ?2. Psychiatric: ?Alert and oriented x 3, mood and behavior normal for situation, pleasant and cooperative with exam ?  ?3. Neurologic: ?Speech and language are normal, face is symmetric, moves all 4 extremities voluntarily, at baseline without acute deficits on limited exam ?  ?4. HEENMT:  ?Head is atraumatic, normocephalic, pupils reactive to light, neck is supple, thyromegaly present, trachea is midline, mucous membranes are moist ?  ?5. Respiratory : ?Lungs are clear to auscultation bilaterally without wheezing, rhonchi, rales, no cyanosis, no increase in work of breathing or accessory muscle use ?  ?6. Cardiovascular : ?Heart rate normal, rhythm is regular, no murmurs, rubs or gallops, no peripheral edema, peripheral pulses palpated ?  ?7. Gastrointestinal:  ?Abdomen is soft, nondistended, nontender to palpation bowel sounds active, no  masses or organomegaly palpated ?  ?8. Skin:  ?Skin is warm, dry and intact without rashes, acute lesions, or ulcers on limited exam ?  ?9.Musculoskeletal:  ?No acute deformities or trauma, no asymmetry in

## 2021-06-19 NOTE — Progress Notes (Signed)
PROGRESS NOTE    Patricia Jackson  JXB:[id number] DOB: 06-Sep-1940 DOA: 06/18/2021 PCP: Benita Stabile, MD    Chief Complaint  Patient presents with   Shortness of Breath   Nausea   Emesis    Brief Narrative:  Patient 81 year old female history of A-fib on chronic anticoagulation, CAD, COPD, hypertension, presenting to the ED with complaint of 1 episode of diarrhea and dyspnea on minimal exertion.  Patient noted to admitting physician that everybody in her family and had a recent virus.  Patient on day of admission had an episode of diarrhea, dry heaves, felt short of breath became dizzy every time she got up.  Patient felt she had increased oral intake without any relief.  Patient denies any chest pain, no palpitations admitted to a chronic cough.  Patient seen in the ED COVID-19 PCR done was negative.  Patient noted to have elevated troponin which was trending down.  Patient also noted to have soft blood pressure with systolics in the 90s.  Patient placed on IV fluids.  Patient admitted for further evaluation and management.    Assessment & Plan:  Active Problems:   Hypertension   Hyperlipidemia   Dyspnea on exertion   COPD (chronic obstructive pulmonary disease) (HCC)   Elevated troponin   Hypothyroidism    Assessment and Plan: Hypothyroidism With thyromegaly Continue Synthroid  Elevated troponin Trending down, 46, 40, 32 EKG is without ischemic changes BNP at baseline 200 Chest x-ray shows no active disease Given the troponin is trending down and patient has not had chest pain-no heparin drip started Minimal troponin elevation in the setting of CKD with creatinine 1.39 -2D echo done this morning with a EF of 60 to 65%,NWMA, mild LVH, normal right ventricular systolic function, mildly elevated pulmonary arterial systolic pressure, mild MVR, moderate mitral stenosis, abnormal tricuspid valve. -Case discussed with cardiology who reviewed 2D echo, reviewed chart in agreement  that EKG normal with flat troponins not consistent with ACS.  Per cardiology no further cardiac work-up needed at this time if patient is not volume overloaded on examination.  monitor on telemetry  COPD (chronic obstructive pulmonary disease) (HCC) Continue Trelegy VBG is not remarkable Continue to monitor  Dyspnea on exertion Notable sick contacts, COVID-negative Chest x-ray without active disease Low concern for PE given that patient is on Eliquis Troponin elevated but downtrending-less likely cardiac etiology. Possibly a non-COVID virus in the community -Check a respiratory viral panel. -Place on Flonase, Claritin. Monitor on telemetry  Hyperlipidemia Continue statin  Hypertension -Patient with soft blood pressure on admission with systolics in the 90s.   -Discontinue amlodipine and olmesartan.   -Continue metoprolol.   -Follow.         DVT prophylaxis: Eliquis Code Status: Full Family Communication: Updated patient.  No family at bedside. Disposition: TBD.  Status is: Observation The patient remains OBS appropriate and will d/c before 2 midnights.   Consultants:  Curb sided cardiology: Dr. Wyline Mood 06/19/2021  Procedures:  2D echo 06/19/2021 Chest x-ray 06/18/2021   Antimicrobials:  None   Subjective: Patient laying on gurney, 2D echo about to be done.  Denies any ongoing chest pain.  Stated had some shortness of breath on minimal exertion 1 day prior to admission.  Complain of chronic neck and bilateral upper extremity pain.  States her grandson she thought had been exposed to COVID-19 however stated he looked at my chart wrong.  Denies any significant sneezing, congestion.  Objective: Vitals:   06/19/21 1300 06/19/21 1500 06/19/21  1530 06/19/21 1615  BP: 117/75 125/69 111/63 (!) 107/59  Pulse: 66 72 75 70  Resp: 11 16 12 19   Temp:    98.1 F (36.7 C)  TempSrc:    Oral  SpO2: 100% 100% 97% 100%  Weight:      Height:        Intake/Output Summary  (Last 24 hours) at 06/19/2021 1700 Last data filed at 06/19/2021 1600 Gross per 24 hour  Intake 223 ml  Output --  Net 223 ml   Filed Weights   06/18/21 2102  Weight: 61.7 kg    Examination:  General exam: Appears calm and comfortable  Respiratory system: Clear to auscultation. Respiratory effort normal. Cardiovascular system: S1 & S2 heard, RRR. No JVD, murmurs, rubs, gallops or clicks. No pedal edema. Gastrointestinal system: Abdomen is nondistended, soft and nontender. No organomegaly or masses felt. Normal bowel sounds heard. Central nervous system: Alert and oriented. No focal neurological deficits. Extremities: Symmetric 5 x 5 power. Skin: No rashes, lesions or ulcers Psychiatry: Judgement and insight appear normal. Mood & affect appropriate.     Data Reviewed:   CBC: Recent Labs  Lab 06/18/21 2221 06/19/21 0346  WBC 6.8 5.6  HGB 10.5* 10.4*  HCT 35.5* 35.8*  MCV 99.7 101.7*  PLT 137* 123*    Basic Metabolic Panel: Recent Labs  Lab 06/18/21 2221 06/19/21 0346  NA 142 143  K 4.6 4.7  CL 115* 115*  CO2 18* 18*  GLUCOSE 98 125*  BUN 38* 37*  CREATININE 1.39* 1.16*  CALCIUM 9.1 9.1  MG  --  2.2    GFR: Estimated Creatinine Clearance: 37.4 mL/min (A) (by C-G formula based on SCr of 1.16 mg/dL (H)).  Liver Function Tests: Recent Labs  Lab 06/17/21 0905 06/19/21 0346  AST  --  29  ALT 29 20  ALKPHOS  --  54  BILITOT  --  0.5  PROT  --  6.5  ALBUMIN  --  3.8    CBG: No results for input(s): GLUCAP in the last 168 hours.   Recent Results (from the past 240 hour(s))  Resp Panel by RT-PCR (Flu A&B, Covid) Nasopharyngeal Swab     Status: None   Collection Time: 06/18/21  9:09 PM   Specimen: Nasopharyngeal Swab; Nasopharyngeal(NP) swabs in vial transport medium  Result Value Ref Range Status   SARS Coronavirus 2 by RT PCR NEGATIVE NEGATIVE Final    Comment: (NOTE) SARS-CoV-2 target nucleic acids are NOT DETECTED.  The SARS-CoV-2 RNA is  generally detectable in upper respiratory specimens during the acute phase of infection. The lowest concentration of SARS-CoV-2 viral copies this assay can detect is 138 copies/mL. A negative result does not preclude SARS-Cov-2 infection and should not be used as the sole basis for treatment or other patient management decisions. A negative result may occur with  improper specimen collection/handling, submission of specimen other than nasopharyngeal swab, presence of viral mutation(s) within the areas targeted by this assay, and inadequate number of viral copies(<138 copies/mL). A negative result must be combined with clinical observations, patient history, and epidemiological information. The expected result is Negative.  Fact Sheet for Patients:  BloggerCourse.com  Fact Sheet for Healthcare Providers:  SeriousBroker.it  This test is no t yet approved or cleared by the [place] FDA and  has been authorized for detection and/or diagnosis of SARS-CoV-2 by FDA under an Emergency Use Authorization (EUA). This EUA will remain  in effect (meaning this test can be used)  for the duration of the COVID-19 declaration under Section 564(b)(1) of the Act, 21 U.S.C.section 360bbb-3(b)(1), unless the authorization is terminated  or revoked sooner.       Influenza A by PCR NEGATIVE NEGATIVE Final   Influenza B by PCR NEGATIVE NEGATIVE Final    Comment: (NOTE) The Xpert Xpress SARS-CoV-2/FLU/RSV plus assay is intended as an aid in the diagnosis of influenza from Nasopharyngeal swab specimens and should not be used as a sole basis for treatment. Nasal washings and aspirates are unacceptable for Xpert Xpress SARS-CoV-2/FLU/RSV testing.  Fact Sheet for Patients: BloggerCourse.com  Fact Sheet for Healthcare Providers: SeriousBroker.it  This test is not yet approved or cleared by the [place] FDA and has been authorized for detection and/or diagnosis of SARS-CoV-2 by FDA under an Emergency Use Authorization (EUA). This EUA will remain in effect (meaning this test can be used) for the duration of the COVID-19 declaration under Section 564(b)(1) of the Act, 21 U.S.C. section 360bbb-3(b)(1), unless the authorization is terminated or revoked.  Performed at [facility], [address] [place], [place] [address]          Radiology Studies: [facility] 1 View  Result Date: 06/18/2021 CLINICAL DATA:  Cough.  Recent COVID exposure. EXAM: PORTABLE CHEST 1 VIEW COMPARISON:  Chest x-ray 07/06/2019 FINDINGS: There is some scarring in the lung bases. There is no focal lung infiltrate, pleural effusion or pneumothorax. Left axillary surgical clips are present. The cardiomediastinal silhouette is within normal limits. No acute fractures are seen. IMPRESSION: No active disease. Electronically Signed   By: Darliss Cheney M.D.   On: 06/18/2021 22:17   ECHOCARDIOGRAM COMPLETE  Result Date: 06/19/2021    ECHOCARDIOGRAM REPORT   Patient Name:   REBEKKAH GOLA Date of Exam: 06/19/2021 Medical Rec #:  [id number]       Height:       66.0 in Accession #:    [id number]      Weight:       136.0 lb Date of Birth:  [birth year]      BSA:          1.697 m Patient Age:    80 years        BP:           125/68 mmHg Patient Gender: F               HR:           77 bpm. Exam [facility] Procedure: 2D Echo, Cardiac Doppler and Color Doppler Indications:    Elevated Troponin  History:        Patient has prior history of Echocardiogram examinations, most                 recent 08/27/2017. Previous Myocardial Infarction and CAD, COPD,                 Arrythmias:Atrial Flutter and Bradycardia,                 Signs/Symptoms:Dyspnea; Risk Factors:Hypertension, Dyslipidemia                 and Former Smoker.  Sonographer:    Mikki Harbor Referring Phys: [id number] ASIA B ZIERLE-GHOSH IMPRESSIONS  1. Left  ventricular ejection fraction, by estimation, is 60 to 65%. The left ventricle has normal function. The left ventricle has no regional wall motion abnormalities. There is mild left ventricular hypertrophy. Left ventricular diastolic parameters are indeterminate. Elevated left  atrial pressure.  2. Right ventricular systolic function is normal. The right ventricular size is normal. There is mildly elevated pulmonary artery systolic pressure.  3. The mitral valve is abnormal. Mild mitral valve regurgitation. Moderate mitral stenosis. The mean mitral valve gradient is 6.0 mmHg.  4. The tricuspid valve is abnormal.  5. The aortic valve is tricuspid. There is mild calcification of the aortic valve. There is mild thickening of the aortic valve. Aortic valve regurgitation is not visualized. No aortic stenosis is present.  6. The inferior vena cava is normal in size with greater than 50% respiratory variability, suggesting right atrial pressure of 3 mmHg. FINDINGS  Left Ventricle: Left ventricular ejection fraction, by estimation, is 60 to 65%. The left ventricle has normal function. The left ventricle has no regional wall motion abnormalities. The left ventricular internal cavity size was normal in size. There is  mild left ventricular hypertrophy. Left ventricular diastolic parameters are indeterminate. Elevated left atrial pressure. Right Ventricle: The right ventricular size is normal. Right vetricular wall thickness was not well visualized. Right ventricular systolic function is normal. There is mildly elevated pulmonary artery systolic pressure. The tricuspid regurgitant velocity  is 2.93 m/s, and with an assumed right atrial pressure of 3 mmHg, the estimated right ventricular systolic pressure is 37.3 mmHg. Left Atrium: Left atrial size was normal in size. Right Atrium: Right atrial size was normal in size. Pericardium: There is no evidence of pericardial effusion. Mitral Valve: The mitral valve is abnormal. There is  mild thickening of the mitral valve leaflet(s). There is mild calcification of the mitral valve leaflet(s). Mild mitral annular calcification. Mild mitral valve regurgitation. Moderate mitral valve stenosis. MV peak gradient, 12.1 mmHg. The mean mitral valve gradient is 6.0 mmHg. Tricuspid Valve: The tricuspid valve is abnormal. Tricuspid valve regurgitation is mild . No evidence of tricuspid stenosis. Aortic Valve: The aortic valve is tricuspid. There is mild calcification of the aortic valve. There is mild thickening of the aortic valve. There is mild aortic valve annular calcification. Aortic valve regurgitation is not visualized. No aortic stenosis  is present. Aortic valve mean gradient measures 3.0 mmHg. Aortic valve peak gradient measures 6.6 mmHg. Aortic valve area, by VTI measures 2.56 cm. Pulmonic Valve: The pulmonic valve was not well visualized. Pulmonic valve regurgitation is not visualized. No evidence of pulmonic stenosis. Aorta: The aortic root is normal in size and structure. Venous: The inferior vena cava is normal in size with greater than 50% respiratory variability, suggesting right atrial pressure of 3 mmHg. IAS/Shunts: No atrial level shunt detected by color flow Doppler.  LEFT VENTRICLE PLAX 2D LVIDd:         4.40 cm     Diastology LVIDs:         3.00 cm     LV e' medial:    7.40 cm/s LV PW:         1.10 cm     LV E/e' medial:  19.2 LV IVS:        1.10 cm     LV e' lateral:   8.16 cm/s LVOT diam:     1.90 cm     LV E/e' lateral: 17.4 LV SV:         75 LV SV Index:   44 LVOT Area:     2.84 cm  LV Volumes (MOD) LV vol d, MOD A2C: 63.0 ml LV vol d, MOD A4C: 65.1 ml LV vol s, MOD A2C: 18.3 ml LV vol s,  MOD A4C: 27.1 ml LV SV MOD A2C:     44.7 ml LV SV MOD A4C:     65.1 ml LV SV MOD BP:      44.1 ml RIGHT VENTRICLE RV Basal diam:  3.70 cm RV Mid diam:    2.80 cm RV S prime:     12.20 cm/s TAPSE (M-mode): 2.2 cm LEFT ATRIUM             Index        RIGHT ATRIUM           Index LA diam:         3.50 cm 2.06 cm/m   RA Area:     16.60 cm LA Vol (A2C):   53.1 ml 31.28 ml/m  RA Volume:   46.80 ml  27.57 ml/m LA Vol (A4C):   38.2 ml 22.50 ml/m LA Biplane Vol: 47.5 ml 27.98 ml/m  AORTIC VALVE                    PULMONIC VALVE AV Area (Vmax):    2.64 cm     PV Vmax:       0.80 m/s AV Area (Vmean):   2.63 cm     PV Peak grad:  2.5 mmHg AV Area (VTI):     2.56 cm AV Vmax:           128.00 cm/s AV Vmean:          76.500 cm/s AV VTI:            0.295 m AV Peak Grad:      6.6 mmHg AV Mean Grad:      3.0 mmHg LVOT Vmax:         119.00 cm/s LVOT Vmean:        70.900 cm/s LVOT VTI:          0.266 m LVOT/AV VTI ratio: 0.90  AORTA Ao Root diam: 2.70 cm Ao Asc diam:  3.30 cm MITRAL VALVE                TRICUSPID VALVE MV Area (PHT): 3.24 cm     TR Peak grad:   34.3 mmHg MV Area VTI:   1.85 cm     TR Vmax:        293.00 cm/s MV Peak grad:  12.1 mmHg MV Mean grad:  6.0 mmHg     SHUNTS MV Vmax:       1.74 m/s     Systemic VTI:  0.27 m MV Vmean:      107.0 cm/s   Systemic Diam: 1.90 cm MV Decel Time: 234 msec MV E velocity: 142.00 cm/s MV A velocity: 143.00 cm/s MV E/A ratio:  0.99 Dina Rich MD Electronically signed by Dina Rich MD Signature Date/Time: 06/19/2021/12:26:10 PM    Final         Scheduled Meds:  Melene Muller ON 06/20/2021] amLODipine  10 mg Oral Daily   And   [START ON 06/20/2021] irbesartan  300 mg Oral Daily   apixaban  5 mg Oral BID   buPROPion  300 mg Oral Daily   fluticasone  2 spray Each Nare Daily   fluticasone furoate-vilanterol  1 puff Inhalation Daily   And   umeclidinium bromide  1 puff Inhalation Daily   ipratropium-albuterol  3 mL Nebulization Once   levothyroxine  50 mcg Oral QAC breakfast   loratadine  10 mg Oral Daily   metoprolol succinate  50 mg Oral Daily   nicotine  21 mg Transdermal Daily   rosuvastatin  20 mg Oral Daily   sertraline  25 mg Oral Daily   Continuous Infusions:  sodium chloride 100 mL/hr at 06/19/21 1346     LOS: 0 days    Time spent:  35 minutes  No charge    Ramiro Harvest, MD [facility]   To contact the attending provider between 7A-7P or the covering provider during after hours 7P-7A, please log into the web site [email or web address] and access using universal [place] password for that web site. If you do not have the password, please call the hospital operator.  06/19/2021, 5:00 PM

## 2021-06-20 DIAGNOSIS — J449 Chronic obstructive pulmonary disease, unspecified: Secondary | ICD-10-CM | POA: Diagnosis not present

## 2021-06-20 DIAGNOSIS — Z7951 Long term (current) use of inhaled steroids: Secondary | ICD-10-CM | POA: Diagnosis not present

## 2021-06-20 DIAGNOSIS — Z809 Family history of malignant neoplasm, unspecified: Secondary | ICD-10-CM | POA: Diagnosis not present

## 2021-06-20 DIAGNOSIS — I2510 Atherosclerotic heart disease of native coronary artery without angina pectoris: Secondary | ICD-10-CM | POA: Diagnosis present

## 2021-06-20 DIAGNOSIS — Z853 Personal history of malignant neoplasm of breast: Secondary | ICD-10-CM | POA: Diagnosis not present

## 2021-06-20 DIAGNOSIS — I4891 Unspecified atrial fibrillation: Secondary | ICD-10-CM | POA: Diagnosis present

## 2021-06-20 DIAGNOSIS — Z8249 Family history of ischemic heart disease and other diseases of the circulatory system: Secondary | ICD-10-CM | POA: Diagnosis not present

## 2021-06-20 DIAGNOSIS — E872 Acidosis, unspecified: Secondary | ICD-10-CM | POA: Diagnosis not present

## 2021-06-20 DIAGNOSIS — R0609 Other forms of dyspnea: Secondary | ICD-10-CM | POA: Diagnosis not present

## 2021-06-20 DIAGNOSIS — Z28310 Unvaccinated for covid-19: Secondary | ICD-10-CM | POA: Diagnosis not present

## 2021-06-20 DIAGNOSIS — I10 Essential (primary) hypertension: Secondary | ICD-10-CM | POA: Diagnosis not present

## 2021-06-20 DIAGNOSIS — Z79890 Hormone replacement therapy (postmenopausal): Secondary | ICD-10-CM | POA: Diagnosis not present

## 2021-06-20 DIAGNOSIS — N1831 Chronic kidney disease, stage 3a: Secondary | ICD-10-CM | POA: Diagnosis present

## 2021-06-20 DIAGNOSIS — F17200 Nicotine dependence, unspecified, uncomplicated: Secondary | ICD-10-CM | POA: Diagnosis present

## 2021-06-20 DIAGNOSIS — Z7901 Long term (current) use of anticoagulants: Secondary | ICD-10-CM | POA: Diagnosis not present

## 2021-06-20 DIAGNOSIS — I129 Hypertensive chronic kidney disease with stage 1 through stage 4 chronic kidney disease, or unspecified chronic kidney disease: Secondary | ICD-10-CM | POA: Diagnosis present

## 2021-06-20 DIAGNOSIS — Z7952 Long term (current) use of systemic steroids: Secondary | ICD-10-CM | POA: Diagnosis not present

## 2021-06-20 DIAGNOSIS — E785 Hyperlipidemia, unspecified: Secondary | ICD-10-CM | POA: Diagnosis not present

## 2021-06-20 DIAGNOSIS — Z79899 Other long term (current) drug therapy: Secondary | ICD-10-CM | POA: Diagnosis not present

## 2021-06-20 DIAGNOSIS — R778 Other specified abnormalities of plasma proteins: Secondary | ICD-10-CM | POA: Diagnosis not present

## 2021-06-20 DIAGNOSIS — Z20822 Contact with and (suspected) exposure to covid-19: Secondary | ICD-10-CM | POA: Diagnosis present

## 2021-06-20 DIAGNOSIS — E039 Hypothyroidism, unspecified: Secondary | ICD-10-CM | POA: Diagnosis not present

## 2021-06-20 LAB — BASIC METABOLIC PANEL
Anion gap: 10 (ref 5–15)
BUN: 33 mg/dL — ABNORMAL HIGH (ref 8–23)
CO2: 16 mmol/L — ABNORMAL LOW (ref 22–32)
Calcium: 8.8 mg/dL — ABNORMAL LOW (ref 8.9–10.3)
Chloride: 117 mmol/L — ABNORMAL HIGH (ref 98–111)
Creatinine, Ser: 1.04 mg/dL — ABNORMAL HIGH (ref 0.44–1.00)
GFR, Estimated: 55 mL/min — ABNORMAL LOW (ref >60)
Glucose, Bld: 106 mg/dL — ABNORMAL HIGH (ref 70–99)
Potassium: 4.2 mmol/L (ref 3.5–5.1)
Sodium: 143 mmol/L (ref 135–145)

## 2021-06-20 LAB — LIPID PANEL
Cholesterol: 123 mg/dL (ref 0–200)
HDL: 49 mg/dL (ref >40)
LDL Cholesterol: 64 mg/dL (ref 0–99)
Total CHOL/HDL Ratio: 2.5 RATIO
Triglycerides: 49 mg/dL (ref <150)
VLDL: 10 mg/dL (ref 0–40)

## 2021-06-20 LAB — CBC
HCT: 29.4 % — ABNORMAL LOW (ref 36.0–46.0)
Hemoglobin: 8.7 g/dL — ABNORMAL LOW (ref 12.0–15.0)
MCH: 29.9 pg (ref 26.0–34.0)
MCHC: 29.6 g/dL — ABNORMAL LOW (ref 30.0–36.0)
MCV: 101 fL — ABNORMAL HIGH (ref 80.0–100.0)
Platelets: 119 10*3/uL — ABNORMAL LOW (ref 150–400)
RBC: 2.91 MIL/uL — ABNORMAL LOW (ref 3.87–5.11)
RDW: 15.1 % (ref 11.5–15.5)
WBC: 8.4 10*3/uL (ref 4.0–10.5)
nRBC: 0 % (ref 0.0–0.2)

## 2021-06-20 MED ORDER — SODIUM BICARBONATE 8.4 % IV SOLN
INTRAVENOUS | Status: DC
  Filled 2021-06-20 (×5): qty 1000

## 2021-06-20 NOTE — Assessment & Plan Note (Addendum)
-   Patient noted to be acidotic on presentation, likely secondary to GI losses. ?-Acidosis resolved on bicarb drip.  ?-Outpatient follow-up with PCP.   ?

## 2021-06-20 NOTE — Plan of Care (Signed)
?  Problem: Acute Rehab PT Goals(only PT should resolve) ?Goal: Pt Will Go Supine/Side To Sit ?06/20/2021 1003 by Ardis Rowan, PT ?Outcome: Progressing ?06/20/2021 1001 by Ardis Rowan, PT ?Outcome: Progressing ?Flowsheets (Taken 06/20/2021 1001) ?Pt will go Supine/Side to Sit: with modified independence ?Goal: Patient Will Transfer Sit To/From Stand ?06/20/2021 1003 by Ardis Rowan, PT ?Outcome: Progressing ?06/20/2021 1001 by Ardis Rowan, PT ?Outcome: Progressing ?Flowsheets (Taken 06/20/2021 1001) ?Patient will transfer sit to/from stand: with supervision ?Goal: Pt Will Transfer Bed To Chair/Chair To Bed ?06/20/2021 1003 by Ardis Rowan, PT ?Outcome: Progressing ?06/20/2021 1001 by Ardis Rowan, PT ?Outcome: Progressing ?Flowsheets (Taken 06/20/2021 1001) ?Pt will Transfer Bed to Chair/Chair to Bed: with supervision ?Goal: Pt Will Ambulate ?06/20/2021 1003 by Ardis Rowan, PT ?Outcome: Progressing ?06/20/2021 1001 by Ardis Rowan, PT ?Outcome: Progressing ?Flowsheets (Taken 06/20/2021 1001) ?Pt will Ambulate: ? 100 feet ? with min guard assist ?  ?

## 2021-06-20 NOTE — Plan of Care (Signed)

## 2021-06-20 NOTE — Progress Notes (Signed)
?PROGRESS NOTE ? ? ? Patricia Jackson  GYJ:[id number] DOB: 1940-05-20 DOA: 06/18/2021 ?PCP: Celene Squibb, MD  ? ? ?Chief Complaint  ?Patient presents with  ? Shortness of Breath  ? Nausea  ? Emesis  ? ? ?Brief Narrative:  ?Patient 81 year old female history of A-fib on chronic anticoagulation, CAD, COPD, hypertension, presenting to the ED with complaint of 1 episode of diarrhea and dyspnea on minimal exertion.  Patient noted to admitting physician that everybody in her family and had a recent virus.  Patient on day of admission had an episode of diarrhea, dry heaves, felt short of breath became dizzy every time she got up.  Patient felt she had increased oral intake without any relief.  Patient denies any chest pain, no palpitations admitted to a chronic cough.  Patient seen in the ED COVID-19 PCR done was negative.  Patient noted to have elevated troponin which was trending down.  Patient also noted to have soft blood pressure with systolics in the 26V.  Patient placed on IV fluids.  Patient admitted for further evaluation and management.  ? ? ?Assessment & Plan: ? Principal Problem: ?  Dyspnea on exertion ?Active Problems: ?  Elevated troponin ?  Acidosis, unspecified ?  Hypertension ?  Hyperlipidemia ?  COPD (chronic obstructive pulmonary disease) ([place]) ?  Hypothyroidism ? ? ? ?Assessment and Plan: ?* Dyspnea on exertion ?Notable sick contacts, COVID-negative ?Chest x-ray without active disease ?Low concern for PE given as patient is on Eliquis and compliant. ?Troponin elevated but downtrending-less likely cardiac etiology. ?Possibly a non-COVID virus in the community ?-Respiratory viral panel pending. ?-Improving clinically. ?-Continue Claritin, Flonase, Breo Ellipta, as needed nebs. ?-Gentle hydration ?-Supportive care. ?Monitor on telemetry ? ?Elevated troponin ?Trending down, 46, 40, 32 ?EKG is without ischemic changes ?BNP at baseline 200 ?Chest x-ray shows no active disease ?Given the troponin is trending  down and patient has not had chest pain-no heparin drip started ?Minimal troponin elevation in the setting of CKD with creatinine 1.39 ?-2D echo done this morning with a EF of 60 to 65%,NWMA, mild LVH, normal right ventricular systolic function, mildly elevated pulmonary arterial systolic pressure, mild MVR, moderate mitral stenosis, abnormal tricuspid valve. ?-Case discussed with cardiology who reviewed 2D echo, reviewed chart in agreement that EKG normal with flat troponins not consistent with ACS.  Per cardiology no further cardiac work-up needed at this time if patient is not volume overloaded on examination. ? monitor on telemetry ? ?Acidosis, unspecified ?- Patient noted to be acidotic on presentation, likely secondary to GI losses. ?-Place on bicarb drip for the next 24 hours. ?-Repeat labs in the AM. ? ?Hypothyroidism ?With thyromegaly ?Continue Synthroid ? ?COPD (chronic obstructive pulmonary disease) ([place]) ?Continue Trelegy ?VBG is not remarkable ?Continue to monitor ? ?Hyperlipidemia ?Continue statin ? ?Hypertension ?-Patient with soft blood pressure on admission with systolics in the 78H.   ?-Continue to hold home regimen amlodipine and olmesartan.   ?-Continue metoprolol.   ?-Gentle hydration. ?-Follow. ? ? ? ? ?  ? ? ?DVT prophylaxis: Eliquis ?Code Status: Full ?Family Communication: Updated patient.  No family at bedside. ?Disposition: Home with home health in 1 to 2 days. ? ?Status is: Observation ?The patient remains OBS appropriate and will d/c before 2 midnights. ?  ?Consultants:  ?Curb sided cardiology: Dr. Harl Bowie 06/19/2021 ? ?Procedures:  ?2D echo 06/19/2021 ?Chest x-ray 06/18/2021 ? ? ?Antimicrobials:  ?None ? ? ?Subjective: ?Sitting up in chair.  Overall feeling better than she did on admission.  Dizziness improving.  No nausea or emesis.  States not eating too well as whenever they bring her food it is cold.  No chest pain.  Dyspnea on exertion improving.   ? ?Objective: ?Vitals:  ? 06/20/21  0442 06/20/21 0840 06/20/21 0842 06/20/21 1506  ?BP: 123/77   123/75  ?Pulse: 72   74  ?Resp: 18   14  ?Temp: 98.4 ?F (36.9 ?C)   97.7 ?F (36.5 ?C)  ?TempSrc:    Oral  ?SpO2: 100% 98% 98% 99%  ?Weight:      ?Height:      ? ? ?Intake/Output Summary (Last 24 hours) at 06/20/2021 1552 ?Last data filed at 06/20/2021 1500 ?Gross per 24 hour  ?Intake 1284.64 ml  ?Output 1 ml  ?Net 1283.64 ml  ? ?Filed Weights  ? 06/18/21 2102  ?Weight: 61.7 kg  ? ? ?Examination: ? ?General exam: NAD.  Dry mucous membranes ?Respiratory system: CTA B.  No wheezes, no crackles, no rhonchi.  Fair air movement.  Speaking in full sentences.  No use of accessory muscles of respiration. ?Cardiovascular system: Regular rate and rhythm no murmurs rubs or gallops.  No JVD.  No lower extremity edema. ?Gastrointestinal system: Abdomen is nondistended, soft and nontender. No organomegaly or masses felt. Normal bowel sounds heard. ?Central nervous system: Alert and oriented. No focal neurological deficits. ?Extremities: Symmetric 5 x 5 power. ?Skin: No rashes, lesions or ulcers ?Psychiatry: Judgement and insight appear normal. Mood & affect appropriate.  ? ? ? ?Data Reviewed:  ? ?CBC: ?Recent Labs  ?Lab 06/18/21 ?2221 06/19/21 ?0346 06/20/21 ?0503  ?WBC 6.8 5.6 8.4  ?HGB 10.5* 10.4* 8.7*  ?HCT 35.5* 35.8* 29.4*  ?MCV 99.7 101.7* 101.0*  ?PLT 137* 123* 119*  ? ? ?Basic Metabolic Panel: ?Recent Labs  ?Lab 06/18/21 ?2221 06/19/21 ?0346 06/20/21 ?0503  ?NA 142 143 143  ?K 4.6 4.7 4.2  ?CL 115* 115* 117*  ?CO2 18* 18* 16*  ?GLUCOSE 98 125* 106*  ?BUN 38* 37* 33*  ?CREATININE 1.39* 1.16* 1.04*  ?CALCIUM 9.1 9.1 8.8*  ?MG  --  2.2  --   ? ? ?GFR: ?Estimated Creatinine Clearance: 41.7 mL/min (A) (by C-G formula based on SCr of 1.04 mg/dL (H)). ? ?Liver Function Tests: ?Recent Labs  ?Lab 06/17/21 ?0905 06/19/21 ?0346  ?AST  --  29  ?ALT 29 20  ?ALKPHOS  --  54  ?BILITOT  --  0.5  ?PROT  --  6.5  ?ALBUMIN  --  3.8  ? ? ?CBG: ?No results for input(s): GLUCAP in the  last 168 hours. ? ? ?Recent Results (from the past 240 hour(s))  ?Resp Panel by RT-PCR (Flu A&B, Covid) Nasopharyngeal Swab     Status: None  ? Collection Time: 06/18/21  9:09 PM  ? Specimen: Nasopharyngeal Swab; Nasopharyngeal(NP) swabs in vial transport medium  ?Result Value Ref Range Status  ? SARS Coronavirus 2 by RT PCR NEGATIVE NEGATIVE Final  ?  Comment: (NOTE) ?SARS-CoV-2 target nucleic acids are NOT DETECTED. ? ?The SARS-CoV-2 RNA is generally detectable in upper respiratory ?specimens during the acute phase of infection. The lowest ?concentration of SARS-CoV-2 viral copies this assay can detect is ?138 copies/mL. A negative result does not preclude SARS-Cov-2 ?infection and should not be used as the sole basis for treatment or ?other patient management decisions. A negative result may occur with  ?improper specimen collection/handling, submission of specimen other ?than nasopharyngeal swab, presence of viral mutation(s) within the ?areas targeted by this assay,  and inadequate number of viral ?copies(<138 copies/mL). A negative result must be combined with ?clinical observations, patient history, and epidemiological ?information. The expected result is Negative. ? ?Fact Sheet for Patients:  ?EntrepreneurPulse.com.au ? ?Fact Sheet for Healthcare Providers:  ?IncredibleEmployment.be ? ?This test is no t yet approved or cleared by the [place] FDA and  ?has been authorized for detection and/or diagnosis of SARS-CoV-2 by ?FDA under an Emergency Use Authorization (EUA). This EUA will remain  ?in effect (meaning this test can be used) for the duration of the ?COVID-19 declaration under Section 564(b)(1) of the Act, 21 ?U.S.C.section 360bbb-3(b)(1), unless the authorization is terminated  ?or revoked sooner.  ? ? ?  ? Influenza A by PCR NEGATIVE NEGATIVE Final  ? Influenza B by PCR NEGATIVE NEGATIVE Final  ?  Comment: (NOTE) ?The Xpert Xpress SARS-CoV-2/FLU/RSV plus assay is  intended as an aid ?in the diagnosis of influenza from Nasopharyngeal swab specimens and ?should not be used as a sole basis for treatment. Nasal washings and ?aspirates are unacceptable for Xpert Xpress

## 2021-06-20 NOTE — Evaluation (Signed)
Physical Therapy Evaluation ?Patient Details ?Name: Patricia Jackson ?MRN: [id number] ?DOB: 29-Mar-1941 ?Today's Date: 06/20/2021 ? ?History of Present Illness ? Patricia Jackson is a 81 y.o. female with medical history significant of atrial fibrillation, coronary artery disease, COPD, hypertension, and more presents ED with chief complaint of diarrhea and dyspnea.  Patient reports that everybody in her family has recently had a virus.  Patient reports that she only had 1 episode of diarrhea and dry heaves, but today she felt short of breath.  Patient reports that she became dizzy and out of breath every time she got up.  She feels like she has increased her p.o. fluid intake without relief.  She has had no actual vomiting.  Her diarrheal episode was x1 with no blood or melena.  Patient reports sometimes she does have melena but not today.  She denies any chest pains, palpitations.  She admits to chronic cough.  Patient also reports that her hemoglobin was just recently 12, and today is 10.5.  We will continue to trend this.  Patient reports a couple of years ago she required a blood transfusion.  She has not noticed any bleeding, but she is on Eliquis.  Patient reports she has not passed out or fallen down.  She has no other complaints at this time. ? ?  ?Clinical Impression ? Patient is sitting in bed with head of bed elevated on therapist arrival. She is pleasant and agreeable to therapy; states she is feeling better today.  She reports some SOB when trying to stand and walk yesterday.  Patient needs SBA for bed mobility and CGA for sitting balance on EOB.  She is able to transfer sit to stand with therapist CGA and walks without complaint of SOB out into the hallway using the IV pole for balance assist and CGA from therapist.  Patient is sitting in the chair at the end of treatment, therapist notified nursing of above. Patient with overall weakness and impaired balance and will benefit from continued PT services  while here in the hospital.    ?   ? ?Recommendations for follow up therapy are one component of a multi-disciplinary discharge planning process, led by the attending physician.  Recommendations may be updated based on patient status, additional functional criteria and insurance authorization. ? ?Follow Up Recommendations Home health PT ? ?  ?Assistance Recommended at Discharge Intermittent Supervision/Assistance  ?Patient can return home with the following ? A little help with walking and/or transfers;A little help with bathing/dressing/bathroom;Assistance with cooking/housework ? ?  ?Equipment Recommendations None recommended by PT  ?Recommendations for Other Services ?    ?  ?Functional Status Assessment Patient has had a recent decline in their functional status and demonstrates the ability to make significant improvements in function in a reasonable and predictable amount of time.  ? ?  ?Precautions / Restrictions Precautions ?Precautions: Fall ?Restrictions ?Weight Bearing Restrictions: No  ? ?  ? ?Mobility ? Bed Mobility ?Overal bed mobility: Modified Independent ?  ?  ?  ?  ?  ?  ?General bed mobility comments: SBA for supine to sit ?  ? ?Transfers ?Overall transfer level: Modified independent ?  ?  ?  ?  ?  ?  ?  ?  ?General transfer comment: sit to stand with CGA for safety; no AD ?  ? ?Ambulation/Gait ?Ambulation/Gait assistance: Min guard ?Gait Distance (Feet): 75 Feet ?Assistive device: IV Pole ?Gait Pattern/deviations: Decreased step length - right, Decreased step length - left ?Gait velocity: decreased  gait speed ?  ?  ?  ? ?Stairs ?  ?  ?  ?  ?  ? ?Wheelchair Mobility ?  ? ?Modified Rankin (Stroke Patients Only) ?  ? ?  ? ?Balance Overall balance assessment: Needs assistance ?Sitting-balance support: Feet supported, Bilateral upper extremity supported ?Sitting balance-Leahy Scale: Good ?Sitting balance - Comments: good sitting on EOB with feet on the floor and arms on mattress with therapist CGA for  safety ?  ?Standing balance support: No upper extremity supported ?Standing balance-Leahy Scale: Good ?Standing balance comment: good with holding to IV pole and therapist CGA ?  ?  ?  ?  ?  ?  ?  ?  ?  ?  ?  ?   ? ? ? ?Pertinent Vitals/Pain Pain Assessment ?Pain Assessment: 0-10 ?Pain Score: 0-No pain  ? ? ?Home Living Family/patient expects to be discharged to:: Private residence ?Living Arrangements: Other relatives ?Available Help at Discharge: Family ?Type of Home: House ?Home Access: Stairs to enter ?Entrance Stairs-Rails: Can reach both;Left;Right ?Entrance Stairs-Number of Steps: 5 ?  ?Home Layout: One level ?Home Equipment: Shower seat ?   ?  ?Prior Function Prior Level of Function : Independent/Modified Independent ?  ?  ?  ?  ?  ?  ?  ?  ?  ? ? ?Hand Dominance  ? Dominant Hand: Right ? ?  ?Extremity/Trunk Assessment  ? Upper Extremity Assessment ?Upper Extremity Assessment: Generalized weakness ?  ? ?Lower Extremity Assessment ?Lower Extremity Assessment: Generalized weakness ?  ? ?Cervical / Trunk Assessment ?Cervical / Trunk Assessment: Normal  ?Communication  ? Communication: No difficulties  ?Cognition Arousal/Alertness: Awake/alert ?Behavior During Therapy: [facility] for tasks assessed/performed ?Overall Cognitive Status: Within Functional Limits for tasks assessed ?  ?  ?  ?  ?  ?  ?  ?  ?  ?  ?  ?  ?  ?  ?  ?  ?  ?  ?  ? ?  ?General Comments   ? ?  ?Exercises    ? ?Assessment/Plan  ?  ?PT Assessment Patient needs continued PT services  ?PT Problem List Decreased strength;Decreased mobility;Decreased safety awareness;Decreased activity tolerance;Decreased balance ? ?   ?  ?PT Treatment Interventions Therapeutic exercise;Gait training;Balance training;Stair training;Neuromuscular re-education;Functional mobility training;Therapeutic activities;Patient/family education   ? ?PT Goals (Current goals can be found in the Care Plan section)  ?Acute Rehab PT Goals ?Patient Stated Goal: to return home ?PT Goal  Formulation: With patient ?Time For Goal Achievement: 07/04/21 ?Potential to Achieve Goals: Good ? ?  ?Frequency Min 2X/week ?  ? ? ?Co-evaluation   ?  ?  ?  ?  ? ? ?  ?AM-PAC PT "6 Clicks" Mobility  ?Outcome Measure Help needed turning from your back to your side while in a flat bed without using bedrails?: A Little ?Help needed moving from lying on your back to sitting on the side of a flat bed without using bedrails?: A Little ?Help needed moving to and from a bed to a chair (including a wheelchair)?: A Little ?Help needed standing up from a chair using your arms (e.g., wheelchair or bedside chair)?: A Little ?Help needed to walk in hospital room?: A Little ?Help needed climbing 3-5 steps with a railing? : A Lot ?6 Click Score: 17 ? ?  ?End of Session Equipment Utilized During Treatment: Other (comment) (IV pole) ?Activity Tolerance: Patient tolerated treatment well ?Patient left: in chair;with call bell/phone within reach ?Nurse Communication: Mobility status ?PT  Visit Diagnosis: Other abnormalities of gait and mobility (R26.89);Unsteadiness on feet (R26.81);Muscle weakness (generalized) (M62.81) ?  ? ?Time: 9532-0233 ?PT Time Calculation (min) (ACUTE ONLY): 23 min ? ? ?Charges:   PT Evaluation ?$PT Eval Low Complexity: 1 Low ?PT Treatments ?$Gait Training: 8-22 mins ?  ?   ? ? ?9:59 AM, 06/20/21 ?Violette Morneault Small Sharese Manrique MPT ? physical therapy ? [phone] ?Ph:[phone] ? ?

## 2021-06-21 DIAGNOSIS — E872 Acidosis, unspecified: Secondary | ICD-10-CM | POA: Diagnosis not present

## 2021-06-21 DIAGNOSIS — R778 Other specified abnormalities of plasma proteins: Secondary | ICD-10-CM | POA: Diagnosis not present

## 2021-06-21 DIAGNOSIS — J449 Chronic obstructive pulmonary disease, unspecified: Secondary | ICD-10-CM | POA: Diagnosis not present

## 2021-06-21 DIAGNOSIS — R0609 Other forms of dyspnea: Secondary | ICD-10-CM | POA: Diagnosis not present

## 2021-06-21 LAB — BASIC METABOLIC PANEL
Anion gap: 8 (ref 5–15)
BUN: 30 mg/dL — ABNORMAL HIGH (ref 8–23)
CO2: 29 mmol/L (ref 22–32)
Calcium: 9 mg/dL (ref 8.9–10.3)
Chloride: 106 mmol/L (ref 98–111)
Creatinine, Ser: 1.04 mg/dL — ABNORMAL HIGH (ref 0.44–1.00)
GFR, Estimated: 55 mL/min — ABNORMAL LOW (ref >60)
Glucose, Bld: 88 mg/dL (ref 70–99)
Potassium: 4.3 mmol/L (ref 3.5–5.1)
Sodium: 143 mmol/L (ref 135–145)

## 2021-06-21 LAB — CBC
HCT: 32.5 % — ABNORMAL LOW (ref 36.0–46.0)
Hemoglobin: 9.7 g/dL — ABNORMAL LOW (ref 12.0–15.0)
MCH: 29.5 pg (ref 26.0–34.0)
MCHC: 29.8 g/dL — ABNORMAL LOW (ref 30.0–36.0)
MCV: 98.8 fL (ref 80.0–100.0)
Platelets: 125 10*3/uL — ABNORMAL LOW (ref 150–400)
RBC: 3.29 MIL/uL — ABNORMAL LOW (ref 3.87–5.11)
RDW: 14.9 % (ref 11.5–15.5)
WBC: 7.4 10*3/uL (ref 4.0–10.5)
nRBC: 0 % (ref 0.0–0.2)

## 2021-06-21 MED ORDER — SODIUM CHLORIDE 0.9 % IV SOLN: INTRAVENOUS | Status: DC

## 2021-06-21 NOTE — Progress Notes (Signed)
?PROGRESS NOTE ? ? ? Patricia Jackson  XKG:[id number] DOB: 1940/08/17 DOA: 06/18/2021 ?PCP: Celene Squibb, MD  ? ? ?Chief Complaint  ?Patient presents with  ? Shortness of Breath  ? Nausea  ? Emesis  ? ? ?Brief Narrative:  ?Patient 81 year old female history of A-fib on chronic anticoagulation, CAD, COPD, hypertension, presenting to the ED with complaint of 1 episode of diarrhea and dyspnea on minimal exertion.  Patient noted to admitting physician that everybody in her family and had a recent virus.  Patient on day of admission had an episode of diarrhea, dry heaves, felt short of breath became dizzy every time she got up.  Patient felt she had increased oral intake without any relief.  Patient denies any chest pain, no palpitations admitted to a chronic cough.  Patient seen in the ED COVID-19 PCR done was negative.  Patient noted to have elevated troponin which was trending down.  Patient also noted to have soft blood pressure with systolics in the 70Y.  Patient placed on IV fluids.  Patient admitted for further evaluation and management.  ? ? ?Assessment & Plan: ? Principal Problem: ?  Dyspnea on exertion ?Active Problems: ?  Elevated troponin ?  Acidosis, unspecified ?  Hypertension ?  Hyperlipidemia ?  COPD (chronic obstructive pulmonary disease) ([place]) ?  Hypothyroidism ? ? ? ?Assessment and Plan: ?* Dyspnea on exertion ?Notable sick contacts, COVID-negative ?Chest x-ray without active disease ?Low concern for PE given as patient is on Eliquis and compliant. ?Troponin elevated but downtrending-less likely cardiac etiology. ?Possibly a non-COVID virus in the community ?-Respiratory viral panel pending. ?-Clinical improvement. ?-Continue Claritin, Flonase, Breo Ellipta, as needed nebs. ?-Gentle hydration ?-Supportive care. ?Monitor on telemetry ? ?Elevated troponin ?Trending down, 46, 40, 32 ?EKG is without ischemic changes ?BNP at baseline 200 ?Chest x-ray shows no active disease ?Given the troponin is trending  down and patient has not had chest pain-no heparin drip started ?Minimal troponin elevation in the setting of CKD with creatinine 1.39 ?-2D echo done this morning with a EF of 60 to 65%,NWMA, mild LVH, normal right ventricular systolic function, mildly elevated pulmonary arterial systolic pressure, mild MVR, moderate mitral stenosis, abnormal tricuspid valve. ?-Case discussed with cardiology who reviewed 2D echo, reviewed chart in agreement that EKG normal with flat troponins not consistent with ACS.  Per cardiology no further cardiac work-up needed at this time if patient is not volume overloaded on examination. ? monitor on telemetry ? ?Acidosis, unspecified ?- Patient noted to be acidotic on presentation, likely secondary to GI losses. ?-Acidosis improved on bicarb drip.   ?-Discontinue bicarb drip.   ?-Gentle hydration with normal saline.   ?-Repeat labs in the AM.  ? ?Hypothyroidism ?With thyromegaly ?Continue Synthroid ? ?COPD (chronic obstructive pulmonary disease) ([place]) ?Continue Trelegy ?VBG is not remarkable ?Continue to monitor ? ?Hyperlipidemia ?Continue statin ? ?Hypertension ?-Patient with soft blood pressure on admission with systolics in the 63Z.   ?-BP improving. ?-Continue to hold home regimen amlodipine and olmesartan.   ?-Continue metoprolol.   ?-Change IV fluids to normal saline at 50 cc an hour.  ?-Follow. ? ? ? ? ?  ? ? ?DVT prophylaxis: Eliquis ?Code Status: Full ?Family Communication: Updated patient.  No family at bedside. ?Disposition: Home with home health in 1 to 2 days. ? ?Status is: Inpatient. ? ?  ?Consultants:  ?Curb sided cardiology: Dr. Harl Bowie 06/19/2021 ? ?Procedures:  ?2D echo 06/19/2021 ?Chest x-ray 06/18/2021 ? ? ?Antimicrobials:  ?None ? ? ?Subjective: ?Sitting  up in bed.  Overall feeling better.  Patient states dizziness improving.  Shortness of breath on exertion improving.  States having good urine output.  ? ?Objective: ?Vitals:  ? 06/20/21 2135 06/21/21 0538 06/21/21 0756  06/21/21 1442  ?BP: 130/77 139/82  131/73  ?Pulse: 72 74  64  ?Resp: 16 16  16   ?Temp: 98 ?F (36.7 ?C) 98 ?F (36.7 ?C)  97.7 ?F (36.5 ?C)  ?TempSrc: Oral Oral  Oral  ?SpO2: 98% 99% 98% 100%  ?Weight:      ?Height:      ? ? ?Intake/Output Summary (Last 24 hours) at 06/21/2021 1738 ?Last data filed at 06/21/2021 1500 ?Gross per 24 hour  ?Intake 1287.6 ml  ?Output 1 ml  ?Net 1286.6 ml  ? ?Filed Weights  ? 06/18/21 2102  ?Weight: 61.7 kg  ? ? ?Examination: ? ?General exam: NAD.  Less dry mucous membranes ?Respiratory system: Lungs clear to auscultation bilaterally.  No wheezes, no crackles, no rhonchi.  Fair air movement.  Speaking in full sentences. ?Cardiovascular system: RRR no murmurs rubs or gallops.  No JVD.  No lower extremity edema.  ?Gastrointestinal system: Abdomen is nondistended, soft and nontender. No organomegaly or masses felt. Normal bowel sounds heard. ?Central nervous system: Alert and oriented. No focal neurological deficits. ?Extremities: Symmetric 5 x 5 power. ?Skin: No rashes, lesions or ulcers ?Psychiatry: Judgement and insight appear normal. Mood & affect appropriate.  ? ? ? ?Data Reviewed:  ? ?CBC: ?Recent Labs  ?Lab 06/18/21 ?2221 06/19/21 ?0346 06/20/21 ?0503 06/21/21 ?8466  ?WBC 6.8 5.6 8.4 7.4  ?HGB 10.5* 10.4* 8.7* 9.7*  ?HCT 35.5* 35.8* 29.4* 32.5*  ?MCV 99.7 101.7* 101.0* 98.8  ?PLT 137* 123* 119* 125*  ? ? ?Basic Metabolic Panel: ?Recent Labs  ?Lab 06/18/21 ?2221 06/19/21 ?0346 06/20/21 ?0503 06/21/21 ?5993  ?NA 142 143 143 143  ?K 4.6 4.7 4.2 4.3  ?CL 115* 115* 117* 106  ?CO2 18* 18* 16* 29  ?GLUCOSE 98 125* 106* 88  ?BUN 38* 37* 33* 30*  ?CREATININE 1.39* 1.16* 1.04* 1.04*  ?CALCIUM 9.1 9.1 8.8* 9.0  ?MG  --  2.2  --   --   ? ? ?GFR: ?Estimated Creatinine Clearance: 41.7 mL/min (A) (by C-G formula based on SCr of 1.04 mg/dL (H)). ? ?Liver Function Tests: ?Recent Labs  ?Lab 06/17/21 ?0905 06/19/21 ?0346  ?AST  --  29  ?ALT 29 20  ?ALKPHOS  --  54  ?BILITOT  --  0.5  ?PROT  --  6.5   ?ALBUMIN  --  3.8  ? ? ?CBG: ?No results for input(s): GLUCAP in the last 168 hours. ? ? ?Recent Results (from the past 240 hour(s))  ?Resp Panel by RT-PCR (Flu A&B, Covid) Nasopharyngeal Swab     Status: None  ? Collection Time: 06/18/21  9:09 PM  ? Specimen: Nasopharyngeal Swab; Nasopharyngeal(NP) swabs in vial transport medium  ?Result Value Ref Range Status  ? SARS Coronavirus 2 by RT PCR NEGATIVE NEGATIVE Final  ?  Comment: (NOTE) ?SARS-CoV-2 target nucleic acids are NOT DETECTED. ? ?The SARS-CoV-2 RNA is generally detectable in upper respiratory ?specimens during the acute phase of infection. The lowest ?concentration of SARS-CoV-2 viral copies this assay can detect is ?138 copies/mL. A negative result does not preclude SARS-Cov-2 ?infection and should not be used as the sole basis for treatment or ?other patient management decisions. A negative result may occur with  ?improper specimen collection/handling, submission of specimen other ?than nasopharyngeal swab, presence  of viral mutation(s) within the ?areas targeted by this assay, and inadequate number of viral ?copies(<138 copies/mL). A negative result must be combined with ?clinical observations, patient history, and epidemiological ?information. The expected result is Negative. ? ?Fact Sheet for Patients:  ?EntrepreneurPulse.com.au ? ?Fact Sheet for Healthcare Providers:  ?IncredibleEmployment.be ? ?This test is no t yet approved or cleared by the [place] FDA and  ?has been authorized for detection and/or diagnosis of SARS-CoV-2 by ?FDA under an Emergency Use Authorization (EUA). This EUA will remain  ?in effect (meaning this test can be used) for the duration of the ?COVID-19 declaration under Section 564(b)(1) of the Act, 21 ?U.S.C.section 360bbb-3(b)(1), unless the authorization is terminated  ?or revoked sooner.  ? ? ?  ? Influenza A by PCR NEGATIVE NEGATIVE Final  ? Influenza B by PCR NEGATIVE NEGATIVE Final   ?  Comment: (NOTE) ?The Xpert Xpress SARS-CoV-2/FLU/RSV plus assay is intended as an aid ?in the diagnosis of influenza from Nasopharyngeal swab specimens and ?should not be used as a sole basis for treatment.

## 2021-06-22 DIAGNOSIS — R0609 Other forms of dyspnea: Principal | ICD-10-CM

## 2021-06-22 DIAGNOSIS — R778 Other specified abnormalities of plasma proteins: Secondary | ICD-10-CM | POA: Diagnosis not present

## 2021-06-22 DIAGNOSIS — J449 Chronic obstructive pulmonary disease, unspecified: Secondary | ICD-10-CM | POA: Diagnosis not present

## 2021-06-22 DIAGNOSIS — E872 Acidosis, unspecified: Secondary | ICD-10-CM | POA: Diagnosis not present

## 2021-06-22 LAB — BASIC METABOLIC PANEL
Anion gap: 10 (ref 5–15)
BUN: 27 mg/dL — ABNORMAL HIGH (ref 8–23)
CO2: 25 mmol/L (ref 22–32)
Calcium: 9.1 mg/dL (ref 8.9–10.3)
Chloride: 108 mmol/L (ref 98–111)
Creatinine, Ser: 1.11 mg/dL — ABNORMAL HIGH (ref 0.44–1.00)
GFR, Estimated: 51 mL/min — ABNORMAL LOW (ref >60)
Glucose, Bld: 92 mg/dL (ref 70–99)
Potassium: 3.7 mmol/L (ref 3.5–5.1)
Sodium: 143 mmol/L (ref 135–145)

## 2021-06-22 MED ORDER — LORATADINE 10 MG PO TABS: 10.0000 mg | ORAL_TABLET | Freq: Every day | ORAL | 1 refills | Status: DC

## 2021-06-22 MED ORDER — AMLODIPINE-OLMESARTAN 10-40 MG PO TABS: 1.0000 | ORAL_TABLET | Freq: Every morning | ORAL | Status: DC

## 2021-06-22 MED ORDER — FLUTICASONE PROPIONATE 50 MCG/ACT NA SUSP: 2.0000 | Freq: Every day | NASAL | 0 refills | Status: DC

## 2021-06-22 NOTE — Care Management Important Message (Signed)
Important Message ? ?Patient Details  ?Name: Patricia Jackson ?MRN: [id number] ?Date of Birth: Aug 29, 1940 ? ? ?Medicare Important Message Given:  Yes (spoke with Ms. Toya at [phone], no additional copy needed) ? ? ? ? ?Tommy Medal ?06/22/2021, 12:52 PM ?

## 2021-06-22 NOTE — Progress Notes (Signed)
Patient was discharged to home, avs reviewed, IV removed, her grandson provided transportation  ?

## 2021-06-22 NOTE — Progress Notes (Signed)
OT Screen Note ? ?Patient Details ?Name: Sharnita Bogucki ?MRN: [id number] ?DOB: 1940/10/29 ? ? ?Cancelled Treatment:    Reason Eval/Treat Not Completed: OT screened, no needs identified, will sign off. Patient functioning at baseline for ADL tasks. Thank you for the referral.  ? ?Ailene Ravel, OTR/L,CBIS  ?[phone] ? ?06/22/2021, 8:43 AM ?

## 2021-06-22 NOTE — TOC Transition Note (Signed)
Transition of Care (TOC) - CM/SW Discharge Note ? ? ?Patient Details  ?Name: Patricia Jackson ?MRN: [id number] ?Date of Birth: 1941/03/27 ? ?Transition of Care (TOC) CM/SW Contact:  ?Boneta Lucks, RN ?Phone Number: ?06/22/2021, 1:01 PM ? ? ?Clinical Narrative:   Patient discharging home. HHPT ordered. Georgina Snell with [facility] accepted the referral. Added to AVS for discharge today.  ? ? ?Final next level of care: [place] ?Barriers to Discharge: Barriers Resolved ? ? ?Patient Goals and CMS Choice ?Patient states their goals for this hospitalization and ongoing recovery are:: to go home. ?CMS Medicare.gov Compare Post Acute Care list provided to:: Patient ?  ? ?Discharge Placement ?  ?            ?Patient and family notified of of transfer: 06/22/21 ? ?Discharge Plan and Services ?  ?    ?HH Arranged: PT ?[place] Agency: [place] ?  ?  ?Readmission Risk Interventions ?Readmission Risk Prevention Plan 06/22/2021  ?Transportation Screening Complete  ?PCP or Specialist Appt within 5-7 Days Complete  ?Home Care Screening Complete  ?Medication Review (RN CM) Complete  ?Some recent data might be hidden  ? ? ? ? ? ?

## 2021-06-22 NOTE — Discharge Summary (Signed)
Physician Discharge Summary  ?Abriel Hattery WLN:[id number] DOB: 19-May-1940 DOA: 06/18/2021 ? ?PCP: Celene Squibb, MD ? ?Admit date: 06/18/2021 ?Discharge date: 06/22/2021 ? ?Time spent: 50 minutes ? ?Recommendations for Outpatient Follow-up:  ?Follow-up with Celene Squibb, MD in 2 weeks.  On follow-up patient will need a basic metabolic profile done to follow-up on electrolytes and renal function.  Patient blood pressure will need to be followed up upon. ? ? ?Discharge Diagnoses:  ?Principal Problem: ?  Dyspnea on exertion ?Active Problems: ?  Elevated troponin ?  Acidosis, unspecified ?  Hypertension ?  Hyperlipidemia ?  COPD (chronic obstructive pulmonary disease) ([place]) ?  Hypothyroidism ? ? ?Discharge Condition: Stable and improved ? ?Diet recommendation: Heart healthy ? ?Filed Weights  ? 06/18/21 2102  ?Weight: 61.7 kg  ? ? ?History of present illness:  ?HPI per Dr. Josph Macho- Orlin Hilding ?: Patricia Jackson is a 81 y.o. female with medical history significant of atrial fibrillation, coronary artery disease, COPD, hypertension, and more presents ED with chief complaint of diarrhea and dyspnea.  Patient reports that everybody in her family has recently had a virus.  Patient reports that she only had 1 episode of diarrhea and dry heaves, but today she felt short of breath.  Patient reports that she became dizzy and out of breath every time she got up.  She feels like she has increased her p.o. fluid intake without relief.  She has had no actual vomiting.  Her diarrheal episode was x1 with no blood or melena.  Patient reports sometimes she does have melena but not today.  She denies any chest pains, palpitations.  She admits to chronic cough.  Patient also reports that her hemoglobin was just recently 12, and today is 10.5.  We will continue to trend this.  Patient reports a couple of years ago she required a blood transfusion.  She has not noticed any bleeding, but she is on Eliquis.  Patient reports she has not passed out or  fallen down.  She has no other complaints at this time. ?  ?The current smoker, does not drink, does not use illicit drugs, is not vaccinated for COVID.  Patient is full code. ? ?Hospital Course:  ? ?Assessment and Plan: ?* Dyspnea on exertion ?Notable sick contacts, COVID-negative ?Chest x-ray without active disease ?Low concern for PE given as patient is on Eliquis and compliant. ?Troponin elevated but downtrending-less likely cardiac etiology. ?Possibly a non-COVID virus in the community ?-Respiratory viral panel ordered however not collected as was still pending by day of discharge.   ?-Patient was hydrated IV fluid, placed on supportive care and maintained on Claritin, Flonase, Breo Ellipta, as needed nebs. ?-Patient improved clinically with improvement with dyspnea on exertion. ?-Patient was discharged home in stable and improved condition. ?-Outpatient follow-up with PCP. ? ?Elevated troponin ?Trended down, 46, 40, 32 ?EKG is without ischemic changes ?BNP at baseline 200 ?Chest x-ray shows no active disease ?Given the troponin was trending down and patient has not had chest pain-no heparin drip started ?Minimal troponin elevation in the setting of CKD with creatinine 1.39 ?-2D echo done with a EF of 60 to 65%,NWMA, mild LVH, normal right ventricular systolic function, mildly elevated pulmonary arterial systolic pressure, mild MVR, moderate mitral stenosis, abnormal tricuspid valve. ?-Case discussed with cardiology who reviewed 2D echo, reviewed chart in agreement that EKG normal with flat troponins not consistent with ACS.  Per cardiology no further cardiac work-up needed at this time if patient is not volume overloaded  on examination. ?-Patient remained asymptomatic. ?-Outpatient follow-up. ? ?Acidosis, unspecified ?- Patient noted to be acidotic on presentation, likely secondary to GI losses. ?-Acidosis resolved on bicarb drip.  ?-Outpatient follow-up with PCP.   ? ?Hypothyroidism ?-Patient maintained on  home regimen Synthroid. ? ?COPD (chronic obstructive pulmonary disease) ([place]) ?Patient maintained on home regimen Trelegy ?VBG is not remarkable. ? ?Hyperlipidemia ?Patient maintained on home regimen statin. ? ?Hypertension ?-Patient with soft blood pressure on admission with systolics in the 08Q.   ?-Patient's home regimen amlodipine and olmesartan were held during the hospitalization patient maintained on home regimen of metoprolol. ?-Patient hydrated with IV fluids with resolution of soft blood pressure. ?-Antihypertensive medications will be resumed on discharge. ? ? ? ? ?  ? ?Procedures: ?2D echo 06/19/2021 ?Chest x-ray 06/18/2021 ? ?Consultations: ?Curb sided cardiology: Dr. Harl Bowie 06/19/2021 ? ?Discharge Exam: ?Vitals:  ? 06/22/21 0829 06/22/21 0831  ?BP:    ?Pulse:    ?Resp:    ?Temp:    ?SpO2: 98% 98%  ? ? ?General: NAD ?Cardiovascular: Regular rate rhythm no murmurs rubs or gallops.  No JVD.  No lower extremity edema. ?Respiratory: Lungs clear to auscultation bilaterally.  No wheezes, no crackles, no rhonchi.  Fair air movement.  Speaking in full sentences. ? ?Discharge Instructions ? ? ?Discharge Instructions   ? ? Diet - low sodium heart healthy   Complete by: As directed ?  ? Increase activity slowly   Complete by: As directed ?  ? ?  ? ?Allergies as of 06/22/2021   ? ?   Reactions  ? Sulfa Antibiotics Hives  ? Sulfa Antibiotics Hives, Other (See Comments)  ? Reaction not recalled by the patient, but "hives" were noted in her other profile in Epic  ? ?  ? ?  ?Medication List  ?  ? ?STOP taking these medications   ? ?benzonatate 100 MG capsule ?Commonly known as: TESSALON ?  ?fluvoxaMINE 100 MG tablet ?Commonly known as: LUVOX ?  ?predniSONE 20 MG tablet ?Commonly known as: DELTASONE ?  ? ?  ? ?TAKE these medications   ? ?acetaminophen 500 MG tablet ?Commonly known as: TYLENOL ?Take 1,500 mg by mouth every 4 (four) hours as needed for mild pain. ?  ?albuterol 108 (90 Base) MCG/ACT inhaler ?Commonly known as:  VENTOLIN HFA ?Inhale 2 puffs into the lungs every 6 (six) hours as needed for wheezing or shortness of breath. ?  ?albuterol (2.5 MG/3ML) 0.083% nebulizer solution ?Commonly known as: PROVENTIL ?Take 2.5 mg by nebulization every 6 (six) hours as needed for wheezing or shortness of breath. ?  ?amLODipine-olmesartan 10-40 MG tablet ?Commonly known as: AZOR ?Take 1 tablet by mouth every morning. ?Start taking on: June 23, 2021 ?  ?buPROPion 300 MG 24 hr tablet ?Commonly known as: WELLBUTRIN XL ?Take 300 mg by mouth daily. ?  ?denosumab 60 MG/ML Sosy injection ?Commonly known as: PROLIA ?Inject 60 mg into the skin every 6 (six) months. ?  ?Eliquis 5 MG Tabs tablet ?Generic drug: apixaban ?TAKE ONE TABLET (5MG TOTAL) BY MOUTH TWOTIMES DAILY ?What changed: See the new instructions. ?  ?fluticasone 50 MCG/ACT nasal spray ?Commonly known as: FLONASE ?Place 2 sprays into both nostrils daily. ?Start taking on: June 23, 2021 ?  ?levothyroxine 50 MCG tablet ?Commonly known as: SYNTHROID ?Take 50 mcg by mouth daily before breakfast. ?  ?loratadine 10 MG tablet ?Commonly known as: CLARITIN ?Take 1 tablet (10 mg total) by mouth daily. ?Start taking on: June 23, 2021 ?  ?  metoprolol succinate 50 MG 24 hr tablet ?Commonly known as: TOPROL-XL ?Take 50 mg by mouth daily. ?  ?nitroGLYCERIN 0.4 MG SL tablet ?Commonly known as: NITROSTAT ?Place 0.4 mg under the tongue every 5 (five) minutes as needed for chest pain. ?  ?rosuvastatin 20 MG tablet ?Commonly known as: CRESTOR ?Take 20 mg by mouth daily. ?  ?sertraline 25 MG tablet ?Commonly known as: ZOLOFT ?Take 25 mg by mouth daily. ?  ?Trelegy Ellipta 100-62.5-25 MCG/ACT Aepb ?Generic drug: Fluticasone-Umeclidin-Vilant ?Inhale 1 puff into the lungs daily. ?  ?vitamin B-12 100 MCG tablet ?Commonly known as: CYANOCOBALAMIN ?Take 100 mcg by mouth daily. ?  ?Vitamin D3 50 MCG (2000 UT) Tabs ?Take 2,000 Units by mouth daily. ?  ? ?  ? ?Allergies  ?Allergen Reactions  ? Sulfa Antibiotics  Hives  ? Sulfa Antibiotics Hives and Other (See Comments)  ?  Reaction not recalled by the patient, but "hives" were noted in her other profile in Epic  ? ? Follow-up Information   ? ? Celene Squibb, MD. [place]

## 2021-06-23 ENCOUNTER — Encounter (HOSPITAL_COMMUNITY)
Admission: RE | Admit: 2021-06-23 | Discharge: 2021-06-23 | Disposition: A | Discharge status: Home / Self Care | Payer: Medicare Other | Source: Ambulatory Visit | Attending: Internal Medicine | Admitting: Internal Medicine

## 2021-06-23 DIAGNOSIS — M810 Age-related osteoporosis without current pathological fracture: Secondary | ICD-10-CM | POA: Diagnosis not present

## 2021-06-23 MED ORDER — DENOSUMAB 60 MG/ML ~~LOC~~ SOSY
PREFILLED_SYRINGE | SUBCUTANEOUS | Status: AC
  Filled 2021-06-23: qty 1

## 2021-06-23 MED ORDER — DENOSUMAB 60 MG/ML ~~LOC~~ SOSY
60.0000 mg | PREFILLED_SYRINGE | Freq: Once | SUBCUTANEOUS | Status: AC
Start: 2021-06-23 — End: 2021-06-23
  Administered 2021-06-23: 60 mg via SUBCUTANEOUS

## 2021-06-24 DIAGNOSIS — I1310 Hypertensive heart and chronic kidney disease without heart failure, with stage 1 through stage 4 chronic kidney disease, or unspecified chronic kidney disease: Secondary | ICD-10-CM | POA: Diagnosis not present

## 2021-06-24 DIAGNOSIS — I081 Rheumatic disorders of both mitral and tricuspid valves: Secondary | ICD-10-CM | POA: Diagnosis not present

## 2021-06-24 DIAGNOSIS — I4891 Unspecified atrial fibrillation: Secondary | ICD-10-CM | POA: Diagnosis not present

## 2021-06-24 DIAGNOSIS — Z7901 Long term (current) use of anticoagulants: Secondary | ICD-10-CM | POA: Diagnosis not present

## 2021-06-24 DIAGNOSIS — Z9181 History of falling: Secondary | ICD-10-CM | POA: Diagnosis not present

## 2021-06-24 DIAGNOSIS — Z79899 Other long term (current) drug therapy: Secondary | ICD-10-CM | POA: Diagnosis not present

## 2021-06-24 DIAGNOSIS — E785 Hyperlipidemia, unspecified: Secondary | ICD-10-CM | POA: Diagnosis not present

## 2021-06-24 DIAGNOSIS — D509 Iron deficiency anemia, unspecified: Secondary | ICD-10-CM | POA: Diagnosis not present

## 2021-06-24 DIAGNOSIS — E039 Hypothyroidism, unspecified: Secondary | ICD-10-CM | POA: Diagnosis not present

## 2021-06-24 DIAGNOSIS — J449 Chronic obstructive pulmonary disease, unspecified: Secondary | ICD-10-CM | POA: Diagnosis not present

## 2021-06-24 DIAGNOSIS — I4892 Unspecified atrial flutter: Secondary | ICD-10-CM | POA: Diagnosis not present

## 2021-06-24 DIAGNOSIS — Z853 Personal history of malignant neoplasm of breast: Secondary | ICD-10-CM | POA: Diagnosis not present

## 2021-06-24 DIAGNOSIS — Z955 Presence of coronary angioplasty implant and graft: Secondary | ICD-10-CM | POA: Diagnosis not present

## 2021-06-24 DIAGNOSIS — D631 Anemia in chronic kidney disease: Secondary | ICD-10-CM | POA: Diagnosis not present

## 2021-06-24 DIAGNOSIS — I252 Old myocardial infarction: Secondary | ICD-10-CM | POA: Diagnosis not present

## 2021-06-24 DIAGNOSIS — N189 Chronic kidney disease, unspecified: Secondary | ICD-10-CM | POA: Diagnosis not present

## 2021-06-24 DIAGNOSIS — I2510 Atherosclerotic heart disease of native coronary artery without angina pectoris: Secondary | ICD-10-CM | POA: Diagnosis not present

## 2021-06-24 DIAGNOSIS — Z7951 Long term (current) use of inhaled steroids: Secondary | ICD-10-CM | POA: Diagnosis not present

## 2021-06-24 DIAGNOSIS — Z87891 Personal history of nicotine dependence: Secondary | ICD-10-CM | POA: Diagnosis not present

## 2021-06-25 DIAGNOSIS — I1310 Hypertensive heart and chronic kidney disease without heart failure, with stage 1 through stage 4 chronic kidney disease, or unspecified chronic kidney disease: Secondary | ICD-10-CM | POA: Diagnosis not present

## 2021-06-25 DIAGNOSIS — Z853 Personal history of malignant neoplasm of breast: Secondary | ICD-10-CM | POA: Diagnosis not present

## 2021-06-25 DIAGNOSIS — D509 Iron deficiency anemia, unspecified: Secondary | ICD-10-CM | POA: Diagnosis not present

## 2021-06-25 DIAGNOSIS — Z7951 Long term (current) use of inhaled steroids: Secondary | ICD-10-CM | POA: Diagnosis not present

## 2021-06-25 DIAGNOSIS — N189 Chronic kidney disease, unspecified: Secondary | ICD-10-CM | POA: Diagnosis not present

## 2021-06-25 DIAGNOSIS — Z9181 History of falling: Secondary | ICD-10-CM | POA: Diagnosis not present

## 2021-06-25 DIAGNOSIS — I081 Rheumatic disorders of both mitral and tricuspid valves: Secondary | ICD-10-CM | POA: Diagnosis not present

## 2021-06-25 DIAGNOSIS — E785 Hyperlipidemia, unspecified: Secondary | ICD-10-CM | POA: Diagnosis not present

## 2021-06-25 DIAGNOSIS — J449 Chronic obstructive pulmonary disease, unspecified: Secondary | ICD-10-CM | POA: Diagnosis not present

## 2021-06-25 DIAGNOSIS — I4892 Unspecified atrial flutter: Secondary | ICD-10-CM | POA: Diagnosis not present

## 2021-06-25 DIAGNOSIS — Z7901 Long term (current) use of anticoagulants: Secondary | ICD-10-CM | POA: Diagnosis not present

## 2021-06-25 DIAGNOSIS — D631 Anemia in chronic kidney disease: Secondary | ICD-10-CM | POA: Diagnosis not present

## 2021-06-25 DIAGNOSIS — I4891 Unspecified atrial fibrillation: Secondary | ICD-10-CM | POA: Diagnosis not present

## 2021-06-25 DIAGNOSIS — Z87891 Personal history of nicotine dependence: Secondary | ICD-10-CM | POA: Diagnosis not present

## 2021-06-25 DIAGNOSIS — E039 Hypothyroidism, unspecified: Secondary | ICD-10-CM | POA: Diagnosis not present

## 2021-06-25 DIAGNOSIS — Z955 Presence of coronary angioplasty implant and graft: Secondary | ICD-10-CM | POA: Diagnosis not present

## 2021-06-25 DIAGNOSIS — I2510 Atherosclerotic heart disease of native coronary artery without angina pectoris: Secondary | ICD-10-CM | POA: Diagnosis not present

## 2021-06-25 DIAGNOSIS — Z79899 Other long term (current) drug therapy: Secondary | ICD-10-CM | POA: Diagnosis not present

## 2021-06-25 DIAGNOSIS — I252 Old myocardial infarction: Secondary | ICD-10-CM | POA: Diagnosis not present

## 2021-06-26 ENCOUNTER — Other Ambulatory Visit (HOSPITAL_COMMUNITY): Payer: Self-pay

## 2021-06-26 ENCOUNTER — Encounter (HOSPITAL_COMMUNITY): Payer: Self-pay

## 2021-06-26 ENCOUNTER — Other Ambulatory Visit: Payer: Self-pay

## 2021-06-26 ENCOUNTER — Inpatient Hospital Stay (HOSPITAL_COMMUNITY): Payer: Medicare Other

## 2021-06-26 VITALS — BP 119/56 | HR 55 | Temp 97.0°F | Resp 18

## 2021-06-26 DIAGNOSIS — R630 Anorexia: Secondary | ICD-10-CM | POA: Diagnosis not present

## 2021-06-26 DIAGNOSIS — K5900 Constipation, unspecified: Secondary | ICD-10-CM | POA: Diagnosis not present

## 2021-06-26 DIAGNOSIS — Z836 Family history of other diseases of the respiratory system: Secondary | ICD-10-CM | POA: Diagnosis not present

## 2021-06-26 DIAGNOSIS — D649 Anemia, unspecified: Secondary | ICD-10-CM

## 2021-06-26 DIAGNOSIS — R200 Anesthesia of skin: Secondary | ICD-10-CM | POA: Diagnosis not present

## 2021-06-26 DIAGNOSIS — Z8249 Family history of ischemic heart disease and other diseases of the circulatory system: Secondary | ICD-10-CM | POA: Diagnosis not present

## 2021-06-26 DIAGNOSIS — Z853 Personal history of malignant neoplasm of breast: Secondary | ICD-10-CM | POA: Diagnosis not present

## 2021-06-26 DIAGNOSIS — I10 Essential (primary) hypertension: Secondary | ICD-10-CM | POA: Diagnosis not present

## 2021-06-26 DIAGNOSIS — E610 Copper deficiency: Secondary | ICD-10-CM | POA: Diagnosis not present

## 2021-06-26 DIAGNOSIS — Z9049 Acquired absence of other specified parts of digestive tract: Secondary | ICD-10-CM | POA: Diagnosis not present

## 2021-06-26 DIAGNOSIS — Z882 Allergy status to sulfonamides status: Secondary | ICD-10-CM | POA: Diagnosis not present

## 2021-06-26 DIAGNOSIS — D509 Iron deficiency anemia, unspecified: Secondary | ICD-10-CM | POA: Diagnosis not present

## 2021-06-26 DIAGNOSIS — D696 Thrombocytopenia, unspecified: Secondary | ICD-10-CM | POA: Diagnosis not present

## 2021-06-26 DIAGNOSIS — I2510 Atherosclerotic heart disease of native coronary artery without angina pectoris: Secondary | ICD-10-CM | POA: Diagnosis not present

## 2021-06-26 DIAGNOSIS — R0609 Other forms of dyspnea: Secondary | ICD-10-CM | POA: Diagnosis not present

## 2021-06-26 DIAGNOSIS — Z809 Family history of malignant neoplasm, unspecified: Secondary | ICD-10-CM | POA: Diagnosis not present

## 2021-06-26 DIAGNOSIS — E559 Vitamin D deficiency, unspecified: Secondary | ICD-10-CM | POA: Diagnosis not present

## 2021-06-26 DIAGNOSIS — R42 Dizziness and giddiness: Secondary | ICD-10-CM | POA: Diagnosis not present

## 2021-06-26 DIAGNOSIS — Z79899 Other long term (current) drug therapy: Secondary | ICD-10-CM | POA: Diagnosis not present

## 2021-06-26 DIAGNOSIS — R531 Weakness: Secondary | ICD-10-CM | POA: Diagnosis not present

## 2021-06-26 DIAGNOSIS — Z87891 Personal history of nicotine dependence: Secondary | ICD-10-CM | POA: Diagnosis not present

## 2021-06-26 DIAGNOSIS — Z7901 Long term (current) use of anticoagulants: Secondary | ICD-10-CM | POA: Diagnosis not present

## 2021-06-26 DIAGNOSIS — J449 Chronic obstructive pulmonary disease, unspecified: Secondary | ICD-10-CM | POA: Diagnosis not present

## 2021-06-26 LAB — OCCULT BLOOD X 1 CARD TO LAB, STOOL
Fecal Occult Bld: NEGATIVE
Fecal Occult Bld: NEGATIVE

## 2021-06-26 MED ORDER — LORATADINE 10 MG PO TABS
10.0000 mg | ORAL_TABLET | Freq: Once | ORAL | Status: AC
  Administered 2021-06-26: 10 mg via ORAL
  Filled 2021-06-26: qty 1

## 2021-06-26 MED ORDER — SODIUM CHLORIDE 0.9 % IV SOLN
510.0000 mg | Freq: Once | INTRAVENOUS | Status: AC
  Administered 2021-06-26: 510 mg via INTRAVENOUS
  Filled 2021-06-26: qty 510

## 2021-06-26 MED ORDER — SODIUM CHLORIDE 0.9 % IV SOLN: Freq: Once | INTRAVENOUS | Status: AC

## 2021-06-26 MED ORDER — ACETAMINOPHEN 325 MG PO TABS
650.0000 mg | ORAL_TABLET | Freq: Once | ORAL | Status: AC
  Administered 2021-06-26: 650 mg via ORAL
  Filled 2021-06-26: qty 2

## 2021-06-26 NOTE — Patient Instructions (Signed)
[facility]  Discharge Instructions: ?Thank you for choosing [facility] to provide your oncology and hematology care.  ?If you have a lab appointment with the [facility], please come in thru the Main Entrance and check in at the main information desk. ? ?Wear comfortable clothing and clothing appropriate for easy access to any Portacath or PICC line.  ? ?We strive to give you quality time with your provider. You may need to reschedule your appointment if you arrive late (15 or more minutes).  Arriving late affects you and other patients whose appointments are after yours.  Also, if you miss three or more appointments without notifying the office, you may be dismissed from the clinic at the provider?s discretion.    ?  ?For prescription refill requests, have your pharmacy contact our office and allow 72 hours for refills to be completed.   ? ?Today you received the following  Feraheme, return as scheduled. ?  ?To help prevent nausea and vomiting after your treatment, we encourage you to take your nausea medication as directed. ? ?BELOW ARE SYMPTOMS THAT SHOULD BE REPORTED IMMEDIATELY: ?*FEVER GREATER THAN 100.4 F (38 ?C) OR HIGHER ?*CHILLS OR SWEATING ?*NAUSEA AND VOMITING THAT IS NOT CONTROLLED WITH YOUR NAUSEA MEDICATION ?*UNUSUAL SHORTNESS OF BREATH ?*UNUSUAL BRUISING OR BLEEDING ?*URINARY PROBLEMS (pain or burning when urinating, or frequent urination) ?*BOWEL PROBLEMS (unusual diarrhea, constipation, pain near the anus) ?TENDERNESS IN MOUTH AND THROAT WITH OR WITHOUT PRESENCE OF ULCERS (sore throat, sores in mouth, or a toothache) ?UNUSUAL RASH, SWELLING OR PAIN  ?UNUSUAL VAGINAL DISCHARGE OR ITCHING  ? ?Items with * indicate a potential emergency and should be followed up as soon as possible or go to the Emergency Department if any problems should occur. ? ?Please show the CHEMOTHERAPY ALERT CARD or IMMUNOTHERAPY ALERT CARD at check-in to the Emergency Department and triage  nurse. ? ?Should you have questions after your visit or need to cancel or reschedule your appointment, please contact [facility] [phone]  and follow the prompts.  Office hours are 8:00 a.m. to 4:30 p.m. Monday - Friday. Please note that voicemails left after 4:00 p.m. may not be returned until the following business day.  We are closed weekends and major holidays. You have access to a nurse at all times for urgent questions. Please call the main number to the clinic [phone] and follow the prompts. ? ?For any non-urgent questions, you may also contact your provider using MyChart. We now offer e-Visits for anyone 65 and older to request care online for non-urgent symptoms. For details visit [email or web address]. ?  ?Also download the MyChart app! Go to the app store, search "MyChart", open the app, select [facility], and log in with your MyChart username and password. ? ?Due to Covid, a mask is required upon entering the hospital/clinic. If you do not have a mask, one will be given to you upon arrival. For doctor visits, patients may have 1 support person aged 72 or older with them. For treatment visits, patients cannot have anyone with them due to current Covid guidelines and our immunocompromised population.  ?

## 2021-06-26 NOTE — Progress Notes (Signed)
Patient tolerated iron infusion with no complaints voiced. Peripheral IV site clean and dry with good blood return noted before and after infusion. Band aid applied. VSS with discharge and left in satisfactory condition with no s/s of distress noted.   ?

## 2021-06-29 ENCOUNTER — Ambulatory Visit (HOSPITAL_COMMUNITY)
Admission: RE | Admit: 2021-06-29 | Discharge: 2021-06-29 | Disposition: A | Discharge status: Home / Self Care | Payer: Medicare Other | Source: Ambulatory Visit | Attending: Internal Medicine | Admitting: Internal Medicine

## 2021-06-29 ENCOUNTER — Other Ambulatory Visit: Payer: Self-pay

## 2021-06-29 DIAGNOSIS — Z780 Asymptomatic menopausal state: Secondary | ICD-10-CM | POA: Insufficient documentation

## 2021-06-29 DIAGNOSIS — Z853 Personal history of malignant neoplasm of breast: Secondary | ICD-10-CM | POA: Diagnosis not present

## 2021-06-29 DIAGNOSIS — M810 Age-related osteoporosis without current pathological fracture: Secondary | ICD-10-CM | POA: Insufficient documentation

## 2021-06-29 DIAGNOSIS — M8588 Other specified disorders of bone density and structure, other site: Secondary | ICD-10-CM | POA: Diagnosis not present

## 2021-06-29 DIAGNOSIS — Z1231 Encounter for screening mammogram for malignant neoplasm of breast: Secondary | ICD-10-CM | POA: Diagnosis not present

## 2021-06-29 DIAGNOSIS — Z13820 Encounter for screening for osteoporosis: Secondary | ICD-10-CM | POA: Insufficient documentation

## 2021-06-30 ENCOUNTER — Other Ambulatory Visit: Payer: Self-pay | Admitting: Cardiology

## 2021-06-30 NOTE — Telephone Encounter (Signed)
Prescription refill request for Eliquis received. ?Indication: Atrial flutter ?Last office visit: 06/17/21  Ashok Norris MD ?Scr: 1.11 on 06/22/21 ?Age: 81 ?Weight: 61.7kg ? ?Based on above findings Eliquis 74m twice daily is the appropriate dose.  Refill approved. ? ?

## 2021-07-01 DIAGNOSIS — Z9181 History of falling: Secondary | ICD-10-CM | POA: Diagnosis not present

## 2021-07-01 DIAGNOSIS — Z79899 Other long term (current) drug therapy: Secondary | ICD-10-CM | POA: Diagnosis not present

## 2021-07-01 DIAGNOSIS — Z7901 Long term (current) use of anticoagulants: Secondary | ICD-10-CM | POA: Diagnosis not present

## 2021-07-01 DIAGNOSIS — E039 Hypothyroidism, unspecified: Secondary | ICD-10-CM | POA: Diagnosis not present

## 2021-07-01 DIAGNOSIS — D631 Anemia in chronic kidney disease: Secondary | ICD-10-CM | POA: Diagnosis not present

## 2021-07-01 DIAGNOSIS — I1310 Hypertensive heart and chronic kidney disease without heart failure, with stage 1 through stage 4 chronic kidney disease, or unspecified chronic kidney disease: Secondary | ICD-10-CM | POA: Diagnosis not present

## 2021-07-01 DIAGNOSIS — Z7951 Long term (current) use of inhaled steroids: Secondary | ICD-10-CM | POA: Diagnosis not present

## 2021-07-01 DIAGNOSIS — N189 Chronic kidney disease, unspecified: Secondary | ICD-10-CM | POA: Diagnosis not present

## 2021-07-01 DIAGNOSIS — I252 Old myocardial infarction: Secondary | ICD-10-CM | POA: Diagnosis not present

## 2021-07-01 DIAGNOSIS — Z853 Personal history of malignant neoplasm of breast: Secondary | ICD-10-CM | POA: Diagnosis not present

## 2021-07-01 DIAGNOSIS — J449 Chronic obstructive pulmonary disease, unspecified: Secondary | ICD-10-CM | POA: Diagnosis not present

## 2021-07-01 DIAGNOSIS — Z955 Presence of coronary angioplasty implant and graft: Secondary | ICD-10-CM | POA: Diagnosis not present

## 2021-07-01 DIAGNOSIS — I4892 Unspecified atrial flutter: Secondary | ICD-10-CM | POA: Diagnosis not present

## 2021-07-01 DIAGNOSIS — E785 Hyperlipidemia, unspecified: Secondary | ICD-10-CM | POA: Diagnosis not present

## 2021-07-01 DIAGNOSIS — D509 Iron deficiency anemia, unspecified: Secondary | ICD-10-CM | POA: Diagnosis not present

## 2021-07-01 DIAGNOSIS — Z87891 Personal history of nicotine dependence: Secondary | ICD-10-CM | POA: Diagnosis not present

## 2021-07-01 DIAGNOSIS — I081 Rheumatic disorders of both mitral and tricuspid valves: Secondary | ICD-10-CM | POA: Diagnosis not present

## 2021-07-01 DIAGNOSIS — I2510 Atherosclerotic heart disease of native coronary artery without angina pectoris: Secondary | ICD-10-CM | POA: Diagnosis not present

## 2021-07-01 DIAGNOSIS — I4891 Unspecified atrial fibrillation: Secondary | ICD-10-CM | POA: Diagnosis not present

## 2021-07-03 ENCOUNTER — Inpatient Hospital Stay (HOSPITAL_COMMUNITY): Payer: Medicare Other

## 2021-07-03 VITALS — BP 116/67 | HR 52 | Temp 96.2°F | Resp 18

## 2021-07-03 DIAGNOSIS — D509 Iron deficiency anemia, unspecified: Secondary | ICD-10-CM

## 2021-07-03 DIAGNOSIS — Z882 Allergy status to sulfonamides status: Secondary | ICD-10-CM | POA: Diagnosis not present

## 2021-07-03 DIAGNOSIS — R0609 Other forms of dyspnea: Secondary | ICD-10-CM | POA: Diagnosis not present

## 2021-07-03 DIAGNOSIS — J449 Chronic obstructive pulmonary disease, unspecified: Secondary | ICD-10-CM | POA: Diagnosis not present

## 2021-07-03 DIAGNOSIS — Z79899 Other long term (current) drug therapy: Secondary | ICD-10-CM | POA: Diagnosis not present

## 2021-07-03 DIAGNOSIS — Z809 Family history of malignant neoplasm, unspecified: Secondary | ICD-10-CM | POA: Diagnosis not present

## 2021-07-03 DIAGNOSIS — Z7901 Long term (current) use of anticoagulants: Secondary | ICD-10-CM | POA: Diagnosis not present

## 2021-07-03 DIAGNOSIS — D696 Thrombocytopenia, unspecified: Secondary | ICD-10-CM | POA: Diagnosis not present

## 2021-07-03 DIAGNOSIS — E610 Copper deficiency: Secondary | ICD-10-CM | POA: Diagnosis not present

## 2021-07-03 DIAGNOSIS — Z9049 Acquired absence of other specified parts of digestive tract: Secondary | ICD-10-CM | POA: Diagnosis not present

## 2021-07-03 DIAGNOSIS — R531 Weakness: Secondary | ICD-10-CM | POA: Diagnosis not present

## 2021-07-03 DIAGNOSIS — Z836 Family history of other diseases of the respiratory system: Secondary | ICD-10-CM | POA: Diagnosis not present

## 2021-07-03 DIAGNOSIS — Z853 Personal history of malignant neoplasm of breast: Secondary | ICD-10-CM | POA: Diagnosis not present

## 2021-07-03 DIAGNOSIS — Z8249 Family history of ischemic heart disease and other diseases of the circulatory system: Secondary | ICD-10-CM | POA: Diagnosis not present

## 2021-07-03 DIAGNOSIS — K5900 Constipation, unspecified: Secondary | ICD-10-CM | POA: Diagnosis not present

## 2021-07-03 DIAGNOSIS — R42 Dizziness and giddiness: Secondary | ICD-10-CM | POA: Diagnosis not present

## 2021-07-03 DIAGNOSIS — I10 Essential (primary) hypertension: Secondary | ICD-10-CM | POA: Diagnosis not present

## 2021-07-03 DIAGNOSIS — Z87891 Personal history of nicotine dependence: Secondary | ICD-10-CM | POA: Diagnosis not present

## 2021-07-03 DIAGNOSIS — E559 Vitamin D deficiency, unspecified: Secondary | ICD-10-CM | POA: Diagnosis not present

## 2021-07-03 DIAGNOSIS — R200 Anesthesia of skin: Secondary | ICD-10-CM | POA: Diagnosis not present

## 2021-07-03 DIAGNOSIS — I2510 Atherosclerotic heart disease of native coronary artery without angina pectoris: Secondary | ICD-10-CM | POA: Diagnosis not present

## 2021-07-03 MED ORDER — SODIUM CHLORIDE 0.9 % IV SOLN: Freq: Once | INTRAVENOUS | Status: AC

## 2021-07-03 MED ORDER — LORATADINE 10 MG PO TABS
10.0000 mg | ORAL_TABLET | Freq: Once | ORAL | Status: AC
  Administered 2021-07-03: 10 mg via ORAL
  Filled 2021-07-03: qty 1

## 2021-07-03 MED ORDER — ACETAMINOPHEN 325 MG PO TABS
650.0000 mg | ORAL_TABLET | Freq: Once | ORAL | Status: AC
  Administered 2021-07-03: 650 mg via ORAL
  Filled 2021-07-03: qty 2

## 2021-07-03 MED ORDER — SODIUM CHLORIDE 0.9 % IV SOLN
510.0000 mg | Freq: Once | INTRAVENOUS | Status: AC
  Administered 2021-07-03: 510 mg via INTRAVENOUS
  Filled 2021-07-03: qty 510

## 2021-07-03 NOTE — Patient Instructions (Signed)
[facility]  Discharge Instructions: ?Thank you for choosing [facility] to provide your oncology and hematology care.  ?If you have a lab appointment with the [facility], please come in thru the Main Entrance and check in at the main information desk. ? ?Wear comfortable clothing and clothing appropriate for easy access to any Portacath or PICC line.  ? ?We strive to give you quality time with your provider. You may need to reschedule your appointment if you arrive late (15 or more minutes).  Arriving late affects you and other patients whose appointments are after yours.  Also, if you miss three or more appointments without notifying the office, you may be dismissed from the clinic at the provider?s discretion.    ?  ?For prescription refill requests, have your pharmacy contact our office and allow 72 hours for refills to be completed.   ? ?Today you received the following chemotherapy and/or immunotherapy agents Feraheme    ?  ?To help prevent nausea and vomiting after your treatment, we encourage you to take your nausea medication as directed. ? ?BELOW ARE SYMPTOMS THAT SHOULD BE REPORTED IMMEDIATELY: ?*FEVER GREATER THAN 100.4 F (38 ?C) OR HIGHER ?*CHILLS OR SWEATING ?*NAUSEA AND VOMITING THAT IS NOT CONTROLLED WITH YOUR NAUSEA MEDICATION ?*UNUSUAL SHORTNESS OF BREATH ?*UNUSUAL BRUISING OR BLEEDING ?*URINARY PROBLEMS (pain or burning when urinating, or frequent urination) ?*BOWEL PROBLEMS (unusual diarrhea, constipation, pain near the anus) ?TENDERNESS IN MOUTH AND THROAT WITH OR WITHOUT PRESENCE OF ULCERS (sore throat, sores in mouth, or a toothache) ?UNUSUAL RASH, SWELLING OR PAIN  ?UNUSUAL VAGINAL DISCHARGE OR ITCHING  ? ?Items with * indicate a potential emergency and should be followed up as soon as possible or go to the Emergency Department if any problems should occur. ? ?Please show the CHEMOTHERAPY ALERT CARD or IMMUNOTHERAPY ALERT CARD at check-in to the Emergency  Department and triage nurse. ? ?Should you have questions after your visit or need to cancel or reschedule your appointment, please contact [facility] [phone]  and follow the prompts.  Office hours are 8:00 a.m. to 4:30 p.m. Monday - Friday. Please note that voicemails left after 4:00 p.m. may not be returned until the following business day.  We are closed weekends and major holidays. You have access to a nurse at all times for urgent questions. Please call the main number to the clinic [phone] and follow the prompts. ? ?For any non-urgent questions, you may also contact your provider using MyChart. We now offer e-Visits for anyone 81 and older to request care online for non-urgent symptoms. For details visit [email or web address]. ?  ?Also download the MyChart app! Go to the app store, search "MyChart", open the app, select [facility], and log in with your MyChart username and password. ? ?Due to Covid, a mask is required upon entering the hospital/clinic. If you do not have a mask, one will be given to you upon arrival. For doctor visits, patients may have 1 support person aged 76 or older with them. For treatment visits, patients cannot have anyone with them due to current Covid guidelines and our immunocompromised population.  ?

## 2021-07-03 NOTE — Progress Notes (Signed)
Patient presents today for Feraheme infusion per providers order.  Vital signs WNL.  Patient has no new complaints at this time. ? ?Peripheral IV started and blood return noted pre and post infusion. ? ?Feraheme infusion given today per MD orders.  Stable during infusion without adverse affects.  Vital signs stable.  No complaints at this time.  Discharge from clinic ambulatory in stable condition.  Alert and oriented X 3.  Follow up with [facility] as scheduled.  ?

## 2021-07-07 DIAGNOSIS — N189 Chronic kidney disease, unspecified: Secondary | ICD-10-CM | POA: Diagnosis not present

## 2021-07-07 DIAGNOSIS — Z9181 History of falling: Secondary | ICD-10-CM | POA: Diagnosis not present

## 2021-07-07 DIAGNOSIS — I2510 Atherosclerotic heart disease of native coronary artery without angina pectoris: Secondary | ICD-10-CM | POA: Diagnosis not present

## 2021-07-07 DIAGNOSIS — D509 Iron deficiency anemia, unspecified: Secondary | ICD-10-CM | POA: Diagnosis not present

## 2021-07-07 DIAGNOSIS — E785 Hyperlipidemia, unspecified: Secondary | ICD-10-CM | POA: Diagnosis not present

## 2021-07-07 DIAGNOSIS — I1310 Hypertensive heart and chronic kidney disease without heart failure, with stage 1 through stage 4 chronic kidney disease, or unspecified chronic kidney disease: Secondary | ICD-10-CM | POA: Diagnosis not present

## 2021-07-07 DIAGNOSIS — Z7951 Long term (current) use of inhaled steroids: Secondary | ICD-10-CM | POA: Diagnosis not present

## 2021-07-07 DIAGNOSIS — Z7901 Long term (current) use of anticoagulants: Secondary | ICD-10-CM | POA: Diagnosis not present

## 2021-07-07 DIAGNOSIS — I081 Rheumatic disorders of both mitral and tricuspid valves: Secondary | ICD-10-CM | POA: Diagnosis not present

## 2021-07-07 DIAGNOSIS — E039 Hypothyroidism, unspecified: Secondary | ICD-10-CM | POA: Diagnosis not present

## 2021-07-07 DIAGNOSIS — I4892 Unspecified atrial flutter: Secondary | ICD-10-CM | POA: Diagnosis not present

## 2021-07-07 DIAGNOSIS — Z79899 Other long term (current) drug therapy: Secondary | ICD-10-CM | POA: Diagnosis not present

## 2021-07-07 DIAGNOSIS — Z87891 Personal history of nicotine dependence: Secondary | ICD-10-CM | POA: Diagnosis not present

## 2021-07-07 DIAGNOSIS — I252 Old myocardial infarction: Secondary | ICD-10-CM | POA: Diagnosis not present

## 2021-07-07 DIAGNOSIS — J449 Chronic obstructive pulmonary disease, unspecified: Secondary | ICD-10-CM | POA: Diagnosis not present

## 2021-07-07 DIAGNOSIS — Z955 Presence of coronary angioplasty implant and graft: Secondary | ICD-10-CM | POA: Diagnosis not present

## 2021-07-07 DIAGNOSIS — I4891 Unspecified atrial fibrillation: Secondary | ICD-10-CM | POA: Diagnosis not present

## 2021-07-07 DIAGNOSIS — Z853 Personal history of malignant neoplasm of breast: Secondary | ICD-10-CM | POA: Diagnosis not present

## 2021-07-07 DIAGNOSIS — D631 Anemia in chronic kidney disease: Secondary | ICD-10-CM | POA: Diagnosis not present

## 2021-07-17 DIAGNOSIS — Z853 Personal history of malignant neoplasm of breast: Secondary | ICD-10-CM | POA: Diagnosis not present

## 2021-07-17 DIAGNOSIS — E785 Hyperlipidemia, unspecified: Secondary | ICD-10-CM | POA: Diagnosis not present

## 2021-07-17 DIAGNOSIS — I081 Rheumatic disorders of both mitral and tricuspid valves: Secondary | ICD-10-CM | POA: Diagnosis not present

## 2021-07-17 DIAGNOSIS — Z87891 Personal history of nicotine dependence: Secondary | ICD-10-CM | POA: Diagnosis not present

## 2021-07-17 DIAGNOSIS — D509 Iron deficiency anemia, unspecified: Secondary | ICD-10-CM | POA: Diagnosis not present

## 2021-07-17 DIAGNOSIS — I4891 Unspecified atrial fibrillation: Secondary | ICD-10-CM | POA: Diagnosis not present

## 2021-07-17 DIAGNOSIS — Z955 Presence of coronary angioplasty implant and graft: Secondary | ICD-10-CM | POA: Diagnosis not present

## 2021-07-17 DIAGNOSIS — Z7951 Long term (current) use of inhaled steroids: Secondary | ICD-10-CM | POA: Diagnosis not present

## 2021-07-17 DIAGNOSIS — E039 Hypothyroidism, unspecified: Secondary | ICD-10-CM | POA: Diagnosis not present

## 2021-07-17 DIAGNOSIS — Z9181 History of falling: Secondary | ICD-10-CM | POA: Diagnosis not present

## 2021-07-17 DIAGNOSIS — I4892 Unspecified atrial flutter: Secondary | ICD-10-CM | POA: Diagnosis not present

## 2021-07-17 DIAGNOSIS — Z7901 Long term (current) use of anticoagulants: Secondary | ICD-10-CM | POA: Diagnosis not present

## 2021-07-17 DIAGNOSIS — I1310 Hypertensive heart and chronic kidney disease without heart failure, with stage 1 through stage 4 chronic kidney disease, or unspecified chronic kidney disease: Secondary | ICD-10-CM | POA: Diagnosis not present

## 2021-07-17 DIAGNOSIS — D631 Anemia in chronic kidney disease: Secondary | ICD-10-CM | POA: Diagnosis not present

## 2021-07-17 DIAGNOSIS — N189 Chronic kidney disease, unspecified: Secondary | ICD-10-CM | POA: Diagnosis not present

## 2021-07-17 DIAGNOSIS — I2510 Atherosclerotic heart disease of native coronary artery without angina pectoris: Secondary | ICD-10-CM | POA: Diagnosis not present

## 2021-07-17 DIAGNOSIS — Z79899 Other long term (current) drug therapy: Secondary | ICD-10-CM | POA: Diagnosis not present

## 2021-07-17 DIAGNOSIS — J449 Chronic obstructive pulmonary disease, unspecified: Secondary | ICD-10-CM | POA: Diagnosis not present

## 2021-07-17 DIAGNOSIS — I252 Old myocardial infarction: Secondary | ICD-10-CM | POA: Diagnosis not present

## 2021-07-20 ENCOUNTER — Ambulatory Visit (INDEPENDENT_AMBULATORY_CARE_PROVIDER_SITE_OTHER): Payer: Medicare Other | Admitting: Ophthalmology

## 2021-07-20 ENCOUNTER — Encounter (INDEPENDENT_AMBULATORY_CARE_PROVIDER_SITE_OTHER): Payer: Medicare Other | Admitting: Ophthalmology

## 2021-07-20 ENCOUNTER — Encounter (INDEPENDENT_AMBULATORY_CARE_PROVIDER_SITE_OTHER): Payer: Self-pay | Admitting: Ophthalmology

## 2021-07-20 DIAGNOSIS — H35022 Exudative retinopathy, left eye: Secondary | ICD-10-CM

## 2021-07-20 DIAGNOSIS — H35021 Exudative retinopathy, right eye: Secondary | ICD-10-CM | POA: Diagnosis not present

## 2021-07-20 DIAGNOSIS — H3561 Retinal hemorrhage, right eye: Secondary | ICD-10-CM

## 2021-07-20 DIAGNOSIS — H35371 Puckering of macula, right eye: Secondary | ICD-10-CM | POA: Diagnosis not present

## 2021-07-20 NOTE — Assessment & Plan Note (Signed)
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

## 2021-07-20 NOTE — Progress Notes (Signed)
? ? ?07/20/2021 ? ?  ? ?CHIEF COMPLAINT ?Patient presents for  ?Chief Complaint  ?Patient presents with  ? Post-op Follow-up  ? ? ? ? ?HISTORY OF PRESENT ILLNESS: ?Patricia Jackson is a 81 y.o. female who presents to the clinic today for:  ? ?HPI   ?6 weeks dilate OU, FP, OCT, PO OD sx 05/27/2021 (7.5 weeks since surgery). ? 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 ? ? ?Pt states "It looks like there is a hair in my vision in my right eye and it seems like it is always there. My vision is blurry in the right eye." ? ?Last edited by Hurman Horn, MD on 07/20/2021  9:33 AM.  ?  ? ? ?Referring physician: ?Celene Squibb, MD ?[address] ?[address] ?[place],  [place] [id number] ? ?HISTORICAL INFORMATION:  ? ?Selected notes from the [facility] ?  ? ?Lab Results  ?Component Value Date  ? HGBA1C 5.6 10/30/2015  ?  ? ?CURRENT MEDICATIONS: ?No current outpatient medications on file. (Ophthalmic Drugs)  ? ?No current facility-administered medications for this visit. (Ophthalmic Drugs)  ? ?Current Outpatient Medications (Other)  ?Medication Sig  ? acetaminophen (TYLENOL) 500 MG tablet Take 1,500 mg by mouth every 4 (four) hours as needed for mild pain.  ? albuterol (PROVENTIL) (2.5 MG/3ML) 0.083% nebulizer solution Take 2.5 mg by nebulization every 6 (six) hours as needed for wheezing or shortness of breath.  ? albuterol (VENTOLIN HFA) 108 (90 Base) MCG/ACT inhaler Inhale 2 puffs into the lungs every 6 (six) hours as needed for wheezing or shortness of breath.  ? amLODipine-olmesartan (AZOR) 10-40 MG tablet Take 1 tablet by mouth every morning.  ? buPROPion (WELLBUTRIN XL) 300 MG 24 hr tablet Take 300 mg by mouth daily.  ? Cholecalciferol (VITAMIN D3) 50 MCG (2000 UT) TABS Take 2,000 Units by mouth daily.  ? denosumab (PROLIA) 60 MG/ML SOSY injection Inject 60 mg into the skin every 6 (six) months.  ? ELIQUIS 5 MG TABS tablet  TAKE ONE TABLET (5MG TOTAL) BY MOUTH TWOTIMES DAILY  ? fluticasone (FLONASE) 50 MCG/ACT nasal spray Place 2 sprays into both nostrils daily.  ? levothyroxine (SYNTHROID) 50 MCG tablet Take 50 mcg by mouth daily before breakfast.   ? loratadine (CLARITIN) 10 MG tablet Take 1 tablet (10 mg total) by mouth daily.  ? metoprolol succinate (TOPROL-XL) 50 MG 24 hr tablet Take 50 mg by mouth daily.  ? nitroGLYCERIN (NITROSTAT) 0.4 MG SL tablet Place 0.4 mg under the tongue every 5 (five) minutes as needed for chest pain.  ? rosuvastatin (CRESTOR) 20 MG tablet Take 20 mg by mouth daily.   ? sertraline (ZOLOFT) 25 MG tablet Take 25 mg by mouth daily.  ? TRELEGY ELLIPTA 100-62.5-25 MCG/ACT AEPB Inhale 1 puff into the lungs daily.  ? vitamin B-12 (CYANOCOBALAMIN) 100 MCG tablet Take 100 mcg by mouth daily.  ? ?No current facility-administered medications for this visit. (Other)  ? ? ? ? ?REVIEW OF SYSTEMS: ?ROS   ?Negative for: Constitutional, Gastrointestinal, Neurological, Skin, Genitourinary, Musculoskeletal, HENT, Endocrine, Cardiovascular, Eyes, Respiratory, Psychiatric, Allergic/Imm, Heme/Lymph ?Last edited by Hurman Horn, MD on 07/20/2021  9:32 AM.  ?  ? ? ? ?ALLERGIES ?Allergies  ?Allergen Reactions  ? Sulfa Antibiotics Hives  ? Sulfa Antibiotics Hives and Other (See Comments)  ?  Reaction not recalled by the patient, but "hives" were noted in her other  profile in Epic  ? ? ?PAST MEDICAL HISTORY ?Past Medical History:  ?Diagnosis Date  ? A-fib ([place])   ? Atrial flutter ([facility])   ? On Eliquis in 8/17 but discontinued after stent placement  ? Breast cancer ([facility])   ? remote  ? CAD in native artery 03/16/2016  ? COPD (chronic obstructive pulmonary disease) ([place])   ? Hypertension   ? Iron deficiency anemia 06/12/2021  ? S/P angioplasty with stent 03/15/16 DES Resolute, pLCX 03/16/2016  ? Vitreous hemorrhage of left eye ([place] [facility]) 08/01/2019  ? ?Past Surgical History:  ?Procedure Laterality Date  ? APPENDECTOMY    ? BREAST SURGERY  Left   ? CARDIAC CATHETERIZATION N/A 03/15/2016  ? Procedure: Left Heart Cath and Coronary Angiography;  Surgeon: Belva Crome, MD;  Location: [facility];  Service: Cardiovascular;  Laterality: N/A;  ? CARDIAC CATHETERIZATION N/A 03/15/2016  ? Procedure: Coronary Stent Intervention;  Surgeon: Belva Crome, MD;  Location: [facility];  Service: Cardiovascular;  Laterality: N/A;  ? COLONOSCOPY    ? remote  ? COLONOSCOPY N/A 05/26/2016  ? Procedure: COLONOSCOPY;  Surgeon: Daneil Dolin, MD;  [facility];  Service: Endoscopy;  Laterality: N/A;  845  ? ESOPHAGOGASTRODUODENOSCOPY N/A 05/26/2016  ? Procedure: ESOPHAGOGASTRODUODENOSCOPY (EGD);  Surgeon: Daneil Dolin, MD;  [facility]: [facility];  Service: Endoscopy;  Laterality: N/A;  ? MALONEY DILATION N/A 05/26/2016  ? Procedure: MALONEY DILATION;  Surgeon: Daneil Dolin, MD;  [facility]: [facility];  Service: Endoscopy;  Laterality: N/A;  ? VITRECTOMY Left 10/03/2019  ? Dr. Zadie Rhine, Vitrectomy, Focal Laser, Removal of Silicone Oil  ? ? ?FAMILY HISTORY ?Family History  ?Problem Relation Age of Onset  ? Heart disease Mother   ? COPD Father   ? Cancer Sister   ?     unknown primary  ? Cancer Brother   ?     unknown primary  ? Cancer Brother   ? Hypertension Son   ? Colon cancer Neg Hx   ? ? ?SOCIAL HISTORY ?Social History  ? ?Tobacco Use  ? Smoking status: Former  ?  Types: Cigarettes  ?  Quit date: 12/28/2014  ?  Years since quitting: 6.5  ? Smokeless tobacco: Never  ? Tobacco comments:  ?  Patient quit within the last 10 years  ?Vaping Use  ? Vaping Use: Never used  ?Substance Use Topics  ? Alcohol use: Never  ? Drug use: Never  ? ?  ? ?  ? ?OPHTHALMIC EXAM: ? ?Base Eye Exam   ? ? Visual Acuity (ETDRS)   ? ?   Right Left  ? Dist [place] 20/50 20/60  ? Dist ph  NI 20/40 -2  ? ?  ?  ? ? Tonometry (Tonopen, 9:19 AM)   ? ?   Right Left  ? Pressure 8 7  ? ?  ?  ? ? Pupils   ? ?   Pupils Dark Light Shape React APD  ? Right PERRL 5 5 Round Minimal None   ? Left PERRL 5 5 Round Minimal None  ? ?  ?  ? ? Extraocular Movement   ? ?   Right Left  ?  Full Full  ? ?  ?  ? ? Neuro/Psych   ? ? Oriented x3: Yes  ? Mood/Affect: Normal  ? ?  ?  ? ? Dilation   ? ? Both eyes: 1.0% Mydriacyl, 2.5% Phenylephrine @ 9:19 AM  ? ?  ?  ? ?  ? ?  Slit Lamp and Fundus Exam   ? ? External Exam   ? ?   Right Left  ? External Normal Normal  ? ?  ?  ? ? Slit Lamp Exam   ? ?   Right Left  ? Lids/Lashes Normal Normal  ? Conjunctiva/Sclera White and quiet White and quiet  ? Cornea Clear Clear  ? Anterior Chamber Deep and quiet Deep and quiet  ? Iris Round and reactive Round and reactive  ? Lens Posterior chamber intraocular lens Posterior chamber intraocular lens  ? Anterior Vitreous Clear Normal  ? ?  ?  ? ? Fundus Exam   ? ?   Right Left  ? Posterior Vitreous Clear, avitric Clear, vitrectomized, no oil  ? Disc Normal Normal  ? C/D Ratio 0.4 0.25  ? Macula Flat, no ERM, no topographic distortion Normal  ? Vessels Normal Normal  ? 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 ?  ?  ? ?  ? ? ?IMAGING AND PROCEDURES  ?Imaging and Procedures for 07/20/21 ? ?OCT, Retina - OU - Both Eyes   ? ?   ?Right Eye ?Quality was good. Scan locations included subfoveal. Central Foveal Thickness: 281. Progression has improved. Findings include subretinal hyper-reflective material, epiretinal membrane.  ? ?Left Eye ?Quality was good. Scan locations included subfoveal. Central Foveal Thickness: 267. Findings include abnormal foveal contour, retinal drusen , no SRF, no IRF.  ? ?Notes ?OD with no residual CME remaining, improved thickness and less thickness post ERM removal overall vastly improved over the last 2 months, 2 months post  vitrectomy membrane peel ? ?No active macular edema OD ? ?OS no change ? ?  ? ?Color Fundus Photography Optos - OU - Both Eyes   ? ?   ?Right Eye ?Progression has improved. Disc findings include normal observa

## 2021-07-20 NOTE — Assessment & Plan Note (Signed)
2 months postop vitrectomy membrane peel preoperative acuity of 20/80 now 20/50 doing very well ? ?Clear media ?

## 2021-07-20 NOTE — Assessment & Plan Note (Signed)
Associated with peripheral PECHR, improving no new hemorrhage ?

## 2021-07-20 NOTE — Assessment & Plan Note (Signed)
No new hemorrhage OD, peripheral PRP located temporally and inferiorly.  Will need prophylactic peripheral PRP nasally OD in the coming months so as to prevent the occurrence of 360 degrees PECHR as occurred previously in the left eye ?

## 2021-07-21 DIAGNOSIS — I2510 Atherosclerotic heart disease of native coronary artery without angina pectoris: Secondary | ICD-10-CM | POA: Diagnosis not present

## 2021-07-21 DIAGNOSIS — I4892 Unspecified atrial flutter: Secondary | ICD-10-CM | POA: Diagnosis not present

## 2021-07-21 DIAGNOSIS — I4891 Unspecified atrial fibrillation: Secondary | ICD-10-CM | POA: Diagnosis not present

## 2021-07-21 DIAGNOSIS — I081 Rheumatic disorders of both mitral and tricuspid valves: Secondary | ICD-10-CM | POA: Diagnosis not present

## 2021-07-21 DIAGNOSIS — D509 Iron deficiency anemia, unspecified: Secondary | ICD-10-CM | POA: Diagnosis not present

## 2021-07-21 DIAGNOSIS — J449 Chronic obstructive pulmonary disease, unspecified: Secondary | ICD-10-CM | POA: Diagnosis not present

## 2021-07-21 DIAGNOSIS — I1310 Hypertensive heart and chronic kidney disease without heart failure, with stage 1 through stage 4 chronic kidney disease, or unspecified chronic kidney disease: Secondary | ICD-10-CM | POA: Diagnosis not present

## 2021-07-21 DIAGNOSIS — Z87891 Personal history of nicotine dependence: Secondary | ICD-10-CM | POA: Diagnosis not present

## 2021-07-21 DIAGNOSIS — Z79899 Other long term (current) drug therapy: Secondary | ICD-10-CM | POA: Diagnosis not present

## 2021-07-21 DIAGNOSIS — Z853 Personal history of malignant neoplasm of breast: Secondary | ICD-10-CM | POA: Diagnosis not present

## 2021-07-21 DIAGNOSIS — E785 Hyperlipidemia, unspecified: Secondary | ICD-10-CM | POA: Diagnosis not present

## 2021-07-21 DIAGNOSIS — I252 Old myocardial infarction: Secondary | ICD-10-CM | POA: Diagnosis not present

## 2021-07-21 DIAGNOSIS — D631 Anemia in chronic kidney disease: Secondary | ICD-10-CM | POA: Diagnosis not present

## 2021-07-21 DIAGNOSIS — Z7951 Long term (current) use of inhaled steroids: Secondary | ICD-10-CM | POA: Diagnosis not present

## 2021-07-21 DIAGNOSIS — Z7901 Long term (current) use of anticoagulants: Secondary | ICD-10-CM | POA: Diagnosis not present

## 2021-07-21 DIAGNOSIS — Z9181 History of falling: Secondary | ICD-10-CM | POA: Diagnosis not present

## 2021-07-21 DIAGNOSIS — Z955 Presence of coronary angioplasty implant and graft: Secondary | ICD-10-CM | POA: Diagnosis not present

## 2021-07-21 DIAGNOSIS — E039 Hypothyroidism, unspecified: Secondary | ICD-10-CM | POA: Diagnosis not present

## 2021-07-21 DIAGNOSIS — N189 Chronic kidney disease, unspecified: Secondary | ICD-10-CM | POA: Diagnosis not present

## 2021-08-21 ENCOUNTER — Inpatient Hospital Stay (HOSPITAL_COMMUNITY): Payer: Medicare Other | Attending: Hematology

## 2021-08-21 DIAGNOSIS — I2510 Atherosclerotic heart disease of native coronary artery without angina pectoris: Secondary | ICD-10-CM | POA: Diagnosis not present

## 2021-08-21 DIAGNOSIS — D696 Thrombocytopenia, unspecified: Secondary | ICD-10-CM | POA: Diagnosis not present

## 2021-08-21 DIAGNOSIS — I4891 Unspecified atrial fibrillation: Secondary | ICD-10-CM | POA: Insufficient documentation

## 2021-08-21 DIAGNOSIS — M549 Dorsalgia, unspecified: Secondary | ICD-10-CM | POA: Diagnosis not present

## 2021-08-21 DIAGNOSIS — R079 Chest pain, unspecified: Secondary | ICD-10-CM | POA: Diagnosis not present

## 2021-08-21 DIAGNOSIS — Z8249 Family history of ischemic heart disease and other diseases of the circulatory system: Secondary | ICD-10-CM | POA: Insufficient documentation

## 2021-08-21 DIAGNOSIS — I10 Essential (primary) hypertension: Secondary | ICD-10-CM | POA: Diagnosis not present

## 2021-08-21 DIAGNOSIS — R0602 Shortness of breath: Secondary | ICD-10-CM | POA: Insufficient documentation

## 2021-08-21 DIAGNOSIS — Z836 Family history of other diseases of the respiratory system: Secondary | ICD-10-CM | POA: Diagnosis not present

## 2021-08-21 DIAGNOSIS — Z9049 Acquired absence of other specified parts of digestive tract: Secondary | ICD-10-CM | POA: Diagnosis not present

## 2021-08-21 DIAGNOSIS — Z7901 Long term (current) use of anticoagulants: Secondary | ICD-10-CM | POA: Diagnosis not present

## 2021-08-21 DIAGNOSIS — Z79899 Other long term (current) drug therapy: Secondary | ICD-10-CM | POA: Diagnosis not present

## 2021-08-21 DIAGNOSIS — Z853 Personal history of malignant neoplasm of breast: Secondary | ICD-10-CM | POA: Diagnosis not present

## 2021-08-21 DIAGNOSIS — R531 Weakness: Secondary | ICD-10-CM | POA: Insufficient documentation

## 2021-08-21 DIAGNOSIS — D649 Anemia, unspecified: Secondary | ICD-10-CM | POA: Insufficient documentation

## 2021-08-21 DIAGNOSIS — Z87891 Personal history of nicotine dependence: Secondary | ICD-10-CM | POA: Diagnosis not present

## 2021-08-21 DIAGNOSIS — R42 Dizziness and giddiness: Secondary | ICD-10-CM | POA: Diagnosis not present

## 2021-08-21 DIAGNOSIS — D509 Iron deficiency anemia, unspecified: Secondary | ICD-10-CM

## 2021-08-21 DIAGNOSIS — E610 Copper deficiency: Secondary | ICD-10-CM

## 2021-08-21 DIAGNOSIS — Z809 Family history of malignant neoplasm, unspecified: Secondary | ICD-10-CM | POA: Diagnosis not present

## 2021-08-21 DIAGNOSIS — I252 Old myocardial infarction: Secondary | ICD-10-CM | POA: Diagnosis not present

## 2021-08-21 DIAGNOSIS — Z882 Allergy status to sulfonamides status: Secondary | ICD-10-CM | POA: Insufficient documentation

## 2021-08-21 DIAGNOSIS — J449 Chronic obstructive pulmonary disease, unspecified: Secondary | ICD-10-CM | POA: Diagnosis not present

## 2021-08-21 DIAGNOSIS — K5900 Constipation, unspecified: Secondary | ICD-10-CM | POA: Diagnosis not present

## 2021-08-21 LAB — CBC WITH DIFFERENTIAL/PLATELET
Abs Immature Granulocytes: 0.02 10*3/uL (ref 0.00–0.07)
Basophils Absolute: 0 10*3/uL (ref 0.0–0.1)
Basophils Relative: 1 %
Eosinophils Absolute: 0.2 10*3/uL (ref 0.0–0.5)
Eosinophils Relative: 3 %
HCT: 37.7 % (ref 36.0–46.0)
Hemoglobin: 11.6 g/dL — ABNORMAL LOW (ref 12.0–15.0)
Immature Granulocytes: 0 %
Lymphocytes Relative: 21 %
Lymphs Abs: 1.1 10*3/uL (ref 0.7–4.0)
MCH: 30.1 pg (ref 26.0–34.0)
MCHC: 30.8 g/dL (ref 30.0–36.0)
MCV: 97.9 fL (ref 80.0–100.0)
Monocytes Absolute: 0.4 10*3/uL (ref 0.1–1.0)
Monocytes Relative: 9 %
Neutro Abs: 3.4 10*3/uL (ref 1.7–7.7)
Neutrophils Relative %: 66 %
Platelets: ADEQUATE 10*3/uL (ref 150–400)
RBC: 3.85 MIL/uL — ABNORMAL LOW (ref 3.87–5.11)
RDW: 15.9 % — ABNORMAL HIGH (ref 11.5–15.5)
Smear Review: ADEQUATE
WBC: 5.2 10*3/uL (ref 4.0–10.5)
nRBC: 0 % (ref 0.0–0.2)

## 2021-08-21 LAB — IRON AND TIBC
Iron: 105 ug/dL (ref 28–170)
Saturation Ratios: 32 % — ABNORMAL HIGH (ref 10.4–31.8)
TIBC: 331 ug/dL (ref 250–450)
UIBC: 226 ug/dL

## 2021-08-21 LAB — SAMPLE TO BLOOD BANK

## 2021-08-21 LAB — FERRITIN: Ferritin: 238 ng/mL (ref 11–307)

## 2021-08-23 ENCOUNTER — Encounter (HOSPITAL_COMMUNITY): Payer: Self-pay

## 2021-08-23 ENCOUNTER — Emergency Department (HOSPITAL_COMMUNITY)
Admission: EM | Admit: 2021-08-23 | Discharge: 2021-08-24 | Disposition: A | Discharge status: Home / Self Care | Payer: Medicare Other | Attending: Emergency Medicine | Admitting: Emergency Medicine

## 2021-08-23 ENCOUNTER — Emergency Department (HOSPITAL_COMMUNITY): Payer: Medicare Other

## 2021-08-23 ENCOUNTER — Other Ambulatory Visit: Payer: Self-pay

## 2021-08-23 DIAGNOSIS — Z79899 Other long term (current) drug therapy: Secondary | ICD-10-CM | POA: Diagnosis not present

## 2021-08-23 DIAGNOSIS — R109 Unspecified abdominal pain: Secondary | ICD-10-CM | POA: Insufficient documentation

## 2021-08-23 DIAGNOSIS — R072 Precordial pain: Secondary | ICD-10-CM

## 2021-08-23 DIAGNOSIS — I700 Atherosclerosis of aorta: Secondary | ICD-10-CM | POA: Diagnosis not present

## 2021-08-23 DIAGNOSIS — K5730 Diverticulosis of large intestine without perforation or abscess without bleeding: Secondary | ICD-10-CM | POA: Diagnosis not present

## 2021-08-23 DIAGNOSIS — Z87891 Personal history of nicotine dependence: Secondary | ICD-10-CM | POA: Diagnosis not present

## 2021-08-23 DIAGNOSIS — R079 Chest pain, unspecified: Secondary | ICD-10-CM | POA: Diagnosis not present

## 2021-08-23 DIAGNOSIS — I2510 Atherosclerotic heart disease of native coronary artery without angina pectoris: Secondary | ICD-10-CM | POA: Diagnosis not present

## 2021-08-23 DIAGNOSIS — I10 Essential (primary) hypertension: Secondary | ICD-10-CM | POA: Diagnosis not present

## 2021-08-23 DIAGNOSIS — Z7901 Long term (current) use of anticoagulants: Secondary | ICD-10-CM | POA: Diagnosis not present

## 2021-08-23 DIAGNOSIS — N281 Cyst of kidney, acquired: Secondary | ICD-10-CM | POA: Diagnosis not present

## 2021-08-23 DIAGNOSIS — I129 Hypertensive chronic kidney disease with stage 1 through stage 4 chronic kidney disease, or unspecified chronic kidney disease: Secondary | ICD-10-CM | POA: Diagnosis not present

## 2021-08-23 DIAGNOSIS — J449 Chronic obstructive pulmonary disease, unspecified: Secondary | ICD-10-CM | POA: Diagnosis not present

## 2021-08-23 DIAGNOSIS — N1832 Chronic kidney disease, stage 3b: Secondary | ICD-10-CM

## 2021-08-23 HISTORY — DX: Non-ST elevation (NSTEMI) myocardial infarction: I21.4

## 2021-08-23 LAB — BASIC METABOLIC PANEL
Anion gap: 5 (ref 5–15)
BUN: 41 mg/dL — ABNORMAL HIGH (ref 8–23)
CO2: 20 mmol/L — ABNORMAL LOW (ref 22–32)
Calcium: 9.1 mg/dL (ref 8.9–10.3)
Chloride: 116 mmol/L — ABNORMAL HIGH (ref 98–111)
Creatinine, Ser: 1.51 mg/dL — ABNORMAL HIGH (ref 0.44–1.00)
GFR, Estimated: 35 mL/min — ABNORMAL LOW (ref >60)
Glucose, Bld: 124 mg/dL — ABNORMAL HIGH (ref 70–99)
Potassium: 4.4 mmol/L (ref 3.5–5.1)
Sodium: 141 mmol/L (ref 135–145)

## 2021-08-23 LAB — CBC
HCT: 35.8 % — ABNORMAL LOW (ref 36.0–46.0)
Hemoglobin: 11.3 g/dL — ABNORMAL LOW (ref 12.0–15.0)
MCH: 30.3 pg (ref 26.0–34.0)
MCHC: 31.6 g/dL (ref 30.0–36.0)
MCV: 96 fL (ref 80.0–100.0)
Platelets: ADEQUATE 10*3/uL (ref 150–400)
RBC: 3.73 MIL/uL — ABNORMAL LOW (ref 3.87–5.11)
RDW: 15.5 % (ref 11.5–15.5)
WBC: 5.7 10*3/uL (ref 4.0–10.5)
nRBC: 0 % (ref 0.0–0.2)

## 2021-08-23 LAB — PROTIME-INR
INR: 1.4 — ABNORMAL HIGH (ref 0.8–1.2)
Prothrombin Time: 17.5 seconds — ABNORMAL HIGH (ref 11.4–15.2)

## 2021-08-23 LAB — TROPONIN I (HIGH SENSITIVITY): Troponin I (High Sensitivity): 3 ng/L (ref <18)

## 2021-08-23 LAB — COPPER, SERUM: Copper: 100 ug/dL (ref 80–158)

## 2021-08-23 MED ORDER — SODIUM CHLORIDE 0.9 % IV SOLN: INTRAVENOUS | Status: DC

## 2021-08-23 MED ORDER — SODIUM CHLORIDE 0.9 % IV BOLUS
500.0000 mL | Freq: Once | INTRAVENOUS | Status: AC
  Administered 2021-08-24: 500 mL via INTRAVENOUS

## 2021-08-23 NOTE — ED Provider Notes (Signed)
[facility] EMERGENCY DEPARTMENT Provider Note   CSN: [id number] Arrival date & time: 08/23/21  2126     History  Chief Complaint  Patient presents with   Chest Pain    Patricia Jackson is a 81 y.o. female.  Patient with intermittent left anterior chest pain since about 9 this morning.  Longest it lasted about 5 minutes.  Not associated with any new shortness of breath other than her baseline COPD.  Patient not on home oxygen.  Patient has known coronary disease.  Followed by cardiology here in [place] last saw them March 15.  Patient has had non-STEMI and has had cardiac caths and a cyst stent in the past.  Patient also with a complaint of intermittent low lumbar back pain without any radiating of pain has been going on for approximately a month.  Has not followed up for that.  No fall or injury.  Patient is on Eliquis for atrial fibrillation.  Today's rhythm not consistent with atrial fibrillation.  Patient has a history of COPD status post angioplasty with stent in 2017.  History of hypertension patient was a former smoker quit in 2017.  Patient's last cardiac catheterization was done by Daneen Schick in 2017.  Patient is also followed by hematology oncology for anemia.  No chest pain now.      Home Medications Prior to Admission medications   Medication Sig Start Date End Date Taking? Authorizing Provider  acetaminophen (TYLENOL) 500 MG tablet Take 1,500 mg by mouth every 4 (four) hours as needed for mild pain.    [provider]  albuterol (PROVENTIL) (2.5 MG/3ML) 0.083% nebulizer solution Take 2.5 mg by nebulization every 6 (six) hours as needed for wheezing or shortness of breath.    [provider]  albuterol (VENTOLIN HFA) 108 (90 Base) MCG/ACT inhaler Inhale 2 puffs into the lungs every 6 (six) hours as needed for wheezing or shortness of breath.    [provider]  amLODipine-olmesartan (AZOR) 10-40 MG tablet Take 1 tablet by mouth every  morning. 06/23/21   Eugenie Filler, MD  buPROPion (WELLBUTRIN XL) 300 MG 24 hr tablet Take 300 mg by mouth daily. 11/05/19   [provider]  Cholecalciferol (VITAMIN D3) 50 MCG (2000 UT) TABS Take 2,000 Units by mouth daily.    [provider]  denosumab (PROLIA) 60 MG/ML SOSY injection Inject 60 mg into the skin every 6 (six) months.    [provider]  ELIQUIS 5 MG TABS tablet TAKE ONE TABLET (5MG TOTAL) BY MOUTH TWOTIMES DAILY 06/30/21   Sueanne Margarita, MD  fluticasone (FLONASE) 50 MCG/ACT nasal spray Place 2 sprays into both nostrils daily. 06/23/21   Eugenie Filler, MD  levothyroxine (SYNTHROID) 50 MCG tablet Take 50 mcg by mouth daily before breakfast.  09/20/19   [provider]  loratadine (CLARITIN) 10 MG tablet Take 1 tablet (10 mg total) by mouth daily. 06/23/21   Eugenie Filler, MD  metoprolol succinate (TOPROL-XL) 50 MG 24 hr tablet Take 50 mg by mouth daily. 10/15/19   [provider]  nitroGLYCERIN (NITROSTAT) 0.4 MG SL tablet Place 0.4 mg under the tongue every 5 (five) minutes as needed for chest pain. 06/09/19   [provider]  rosuvastatin (CRESTOR) 20 MG tablet Take 20 mg by mouth daily.  02/26/17   [provider]  sertraline (ZOLOFT) 25 MG tablet Take 25 mg by mouth daily.    [provider]  Donnal Debar 100-62.5-25 MCG/ACT AEPB  Inhale 1 puff into the lungs daily. 06/13/21   [provider]  vitamin B-12 (CYANOCOBALAMIN) 100 MCG tablet Take 100 mcg by mouth daily.    [provider]      Allergies    Sulfa antibiotics and Sulfa antibiotics    Review of Systems   Review of Systems  Constitutional:  Negative for chills and fever.  HENT:  Negative for ear pain and sore throat.   Eyes:  Negative for pain and visual disturbance.  Respiratory:  Negative for cough and shortness of breath.   Cardiovascular:  Positive for chest pain. Negative for palpitations and leg swelling.   Gastrointestinal:  Negative for abdominal pain, nausea and vomiting.  Genitourinary:  Negative for dysuria and hematuria.  Musculoskeletal:  Positive for back pain. Negative for arthralgias.  Skin:  Negative for color change and rash.  Neurological:  Negative for seizures and syncope.  All other systems reviewed and are negative.  Physical Exam Updated Vital Signs BP (!) 153/69 (BP Location: Left Arm)   Pulse (!) 52   Temp 97.6 F (36.4 C) (Oral)   Resp 14   Ht 1.676 m (5' 6" )   Wt 61.7 kg   SpO2 100%   BMI 21.95 kg/m  Physical Exam Vitals and nursing note reviewed.  Constitutional:      General: She is not in acute distress.    Appearance: She is well-developed. She is not ill-appearing.  HENT:     Head: Normocephalic and atraumatic.  Eyes:     Conjunctiva/sclera: Conjunctivae normal.  Cardiovascular:     Rate and Rhythm: Regular rhythm. Bradycardia present.     Pulses:          Radial pulses are 2+ on the right side and 2+ on the left side.     Heart sounds: No murmur heard. Pulmonary:     Effort: Pulmonary effort is normal. No respiratory distress.     Breath sounds: Normal breath sounds. No decreased breath sounds, wheezing, rhonchi or rales.  Chest:     Chest wall: No tenderness.  Abdominal:     Palpations: Abdomen is soft.     Tenderness: There is no abdominal tenderness.  Musculoskeletal:        General: No swelling.     Cervical back: Neck supple.     Right lower leg: No edema.     Left lower leg: No edema.     Comments: No tenderness to palpation to lower lumbar area.  Skin:    General: Skin is warm and dry.     Capillary Refill: Capillary refill takes less than 2 seconds.     Coloration: Skin is pale.  Neurological:     General: No focal deficit present.     Mental Status: She is alert and oriented to person, place, and time.  Psychiatric:        Mood and Affect: Mood normal.    ED Results / Procedures / Treatments   Labs (all labs ordered are  listed, but only abnormal results are displayed) Labs Reviewed  CBC - Abnormal; Notable for the following components:      Result Value   RBC 3.73 (*)    Hemoglobin 11.3 (*)    HCT 35.8 (*)    All other components within normal limits  PROTIME-INR - Abnormal; Notable for the following components:   Prothrombin Time 17.5 (*)    INR 1.4 (*)    All other components within normal limits  BASIC  METABOLIC PANEL  TROPONIN I (HIGH SENSITIVITY)    EKG EKG Interpretation  Date/Time:  Sunday Aug 23 2021 21:45:35 EDT Ventricular Rate:  56 PR Interval:  171 QRS Duration: 107 QT Interval:  473 QTC Calculation: 457 R Axis:   72 Text Interpretation: Sinus rhythm Nonspecific T abnrm, anterolateral leads Baseline wander in lead(s) V2 Confirmed by Fredia Sorrow [phone]) on 08/23/2021 10:39:21 PM  Radiology No results found.  Procedures Procedures    Medications Ordered in ED Medications - No data to display  ED Course/ Medical Decision Making/ A&P                           Medical Decision Making Amount and/or Complexity of Data Reviewed Labs: ordered. Radiology: ordered.  Risk Prescription drug management.   Patient patient metabolic panel potassium 4.4 reassuring glucose 124 BUN 41 creatinine one 1.51.  GFR 35.  CBC White blood cell count 5.7 hemoglobin 11.3.  Platelets are clumped  Initial troponin is 3.  Patient's INR is 1.4.  Patient is on Eliquis.  Chest x-ray consistent with COPD no active cardiopulmonary disease.  EKG is consistent with sinus bradycardia and so is in the cardiac monitor.  Patient's first troponin is 3.  Which is reassuring.  Patient does have some change in her chronic kidney disease.  With a GFR of 35.  She is normally in the 40s.  Mucous membranes are moist so I do not think she is significantly dehydrated.  Based on this we will get urinalysis and because of the lumbar back pain will get CT renal just to evaluate that.  We will give some IV  fluids.  While we are waiting on the delta troponin   Final Clinical Impression(s) / ED Diagnoses Final diagnoses:  None    Rx / DC Orders ED Discharge Orders     None         Fredia Sorrow, MD 08/23/21 2340

## 2021-08-23 NOTE — ED Provider Notes (Signed)
Care assumed from Dr. Rogene Houston, patient with known CAD, chest pain, normal initial tropoinin pending second troponin. Also has low back pain, increase in troponin from 1.1 on 3/20 to 1.5 today, getting renal stone protocol CT scan.  Repeat troponin is unchanged.  I have independently reviewed and interpreted this result.  CT scanning shows presence of diverticulosis and increased stool burden but no acute process.  I have independently viewed the images, and agree with the radiologist's interpretation.  She is discharged with instructions to follow-up with her primary care provider in 1 week to recheck her renal function.  Return precautions discussed.  Results for orders placed or performed during the hospital encounter of 82/95/62  Basic metabolic panel  Result Value Ref Range   Sodium 141 135 - 145 mmol/L   Potassium 4.4 3.5 - 5.1 mmol/L   Chloride 116 (H) 98 - 111 mmol/L   CO2 20 (L) 22 - 32 mmol/L   Glucose, Bld 124 (H) 70 - 99 mg/dL   BUN 41 (H) 8 - 23 mg/dL   Creatinine, Ser 1.51 (H) 0.44 - 1.00 mg/dL   Calcium 9.1 8.9 - 10.3 mg/dL   GFR, Estimated 35 (L) >60 mL/min   Anion gap 5 5 - 15  CBC  Result Value Ref Range   WBC 5.7 4.0 - 10.5 K/uL   RBC 3.73 (L) 3.87 - 5.11 MIL/uL   Hemoglobin 11.3 (L) 12.0 - 15.0 g/dL   HCT 35.8 (L) 36.0 - 46.0 %   MCV 96.0 80.0 - 100.0 fL   MCH 30.3 26.0 - 34.0 pg   MCHC 31.6 30.0 - 36.0 g/dL   RDW 15.5 11.5 - 15.5 %   Platelets  150 - 400 K/uL    PLATELET CLUMPS NOTED ON SMEAR, COUNT APPEARS ADEQUATE   nRBC 0.0 0.0 - 0.2 %  Protime-INR (order if Patient is taking Coumadin / Warfarin)  Result Value Ref Range   Prothrombin Time 17.5 (H) 11.4 - 15.2 seconds   INR 1.4 (H) 0.8 - 1.2  Urinalysis, Routine w reflex microscopic Urine, Clean Catch  Result Value Ref Range   Color, Urine STRAW (A) YELLOW   APPearance CLEAR CLEAR   Specific Gravity, Urine 1.008 1.005 - 1.030   pH 5.0 5.0 - 8.0   Glucose, UA NEGATIVE NEGATIVE mg/dL   Hgb urine dipstick  NEGATIVE NEGATIVE   Bilirubin Urine NEGATIVE NEGATIVE   Ketones, ur NEGATIVE NEGATIVE mg/dL   Protein, ur NEGATIVE NEGATIVE mg/dL   Nitrite NEGATIVE NEGATIVE   Leukocytes,Ua NEGATIVE NEGATIVE  Troponin I (High Sensitivity)  Result Value Ref Range   Troponin I (High Sensitivity) 3 <18 ng/L  Troponin I (High Sensitivity)  Result Value Ref Range   Troponin I (High Sensitivity) 2 <18 ng/L   DG Chest 2 View  Result Date: 08/23/2021 CLINICAL DATA:  Chest pain EXAM: CHEST - 2 VIEW COMPARISON:  None Available. FINDINGS: The lungs are symmetrically, mildly hyperinflated in keeping with changes of underlying COPD. The lungs are clear. No pneumothorax or pleural effusion. Cardiac size within normal limits. Pulmonary vascularity is normal. No acute bone abnormality. Osseous structures are age-appropriate. IMPRESSION: No active cardiopulmonary disease.  COPD. Electronically Signed   By: Fidela Salisbury M.D.   On: 08/23/2021 22:40   CT Renal Stone Study  Result Date: 08/23/2021 CLINICAL DATA:  Flank pain. EXAM: CT ABDOMEN AND PELVIS WITHOUT CONTRAST TECHNIQUE: Multidetector CT imaging of the abdomen and pelvis was performed following the standard protocol without IV contrast. RADIATION DOSE  REDUCTION: This exam was performed according to the departmental dose-optimization program which includes automated exposure control, adjustment of the mA and/or kV according to patient size and/or use of iterative reconstruction technique. COMPARISON:  None Available. FINDINGS: Lower chest: No acute abnormality. Hepatobiliary: No focal liver abnormality is seen. No gallstones, gallbladder wall thickening, or biliary dilatation. Pancreas: Unremarkable. No pancreatic ductal dilatation or surrounding inflammatory changes. Spleen: Normal in size without focal abnormality. Adrenals/Urinary Tract: There cysts in the superior pole the left kidney measuring up to 15 mm. No urinary tract calculus or hydronephrosis. Bilateral adrenal  glands and bladder are within normal limits. Stomach/Bowel: Stomach is within normal limits. No evidence of bowel wall thickening, distention, or inflammatory changes. There is a large amount of stool throughout the entire colon. The appendix is not seen. There is sigmoid colon diverticulosis without evidence for diverticulitis. Vascular/Lymphatic: Aortic atherosclerosis. No enlarged abdominal or pelvic lymph nodes. Reproductive: Uterus and bilateral adnexa are unremarkable. Other: No abdominal wall hernia or abnormality. No abdominopelvic ascites. Musculoskeletal: Mild degenerative changes affect the spine. IMPRESSION: 1.  No evidence for urinary tract calculus or hydronephrosis. 2.  Small left renal cysts. 3.  Sigmoid colon diverticulosis.  Large colonic stool burden. 4.  Aortic Atherosclerosis (ICD10-I70.0). Electronically Signed   By: Ronney Asters M.D.   On: 25/63/8937 34:28       Delora Fuel, MD 76/81/15 [phone]

## 2021-08-23 NOTE — ED Triage Notes (Signed)
Pt arrived via POV from home c/o left side chest pain that began today. Pt reports sitting up in bed when Pain began. Pt denies injury. Pt reports lower bilateral back pain as well. Pt says the pain "comes and goes" and she did not take any medication fo5r chest pain PTA. Pt on blood thinner for Afib at home.

## 2021-08-24 DIAGNOSIS — I252 Old myocardial infarction: Secondary | ICD-10-CM | POA: Diagnosis not present

## 2021-08-24 DIAGNOSIS — J449 Chronic obstructive pulmonary disease, unspecified: Secondary | ICD-10-CM | POA: Diagnosis not present

## 2021-08-24 DIAGNOSIS — I2510 Atherosclerotic heart disease of native coronary artery without angina pectoris: Secondary | ICD-10-CM | POA: Diagnosis not present

## 2021-08-24 DIAGNOSIS — R079 Chest pain, unspecified: Secondary | ICD-10-CM | POA: Diagnosis not present

## 2021-08-24 DIAGNOSIS — D509 Iron deficiency anemia, unspecified: Secondary | ICD-10-CM | POA: Diagnosis not present

## 2021-08-24 DIAGNOSIS — D631 Anemia in chronic kidney disease: Secondary | ICD-10-CM | POA: Diagnosis not present

## 2021-08-24 DIAGNOSIS — I4891 Unspecified atrial fibrillation: Secondary | ICD-10-CM | POA: Diagnosis not present

## 2021-08-24 DIAGNOSIS — I1310 Hypertensive heart and chronic kidney disease without heart failure, with stage 1 through stage 4 chronic kidney disease, or unspecified chronic kidney disease: Secondary | ICD-10-CM | POA: Diagnosis not present

## 2021-08-24 DIAGNOSIS — N189 Chronic kidney disease, unspecified: Secondary | ICD-10-CM | POA: Diagnosis not present

## 2021-08-24 DIAGNOSIS — I4892 Unspecified atrial flutter: Secondary | ICD-10-CM | POA: Diagnosis not present

## 2021-08-24 DIAGNOSIS — E039 Hypothyroidism, unspecified: Secondary | ICD-10-CM | POA: Diagnosis not present

## 2021-08-24 LAB — URINALYSIS, ROUTINE W REFLEX MICROSCOPIC
Bilirubin Urine: NEGATIVE
Glucose, UA: NEGATIVE mg/dL
Hgb urine dipstick: NEGATIVE
Ketones, ur: NEGATIVE mg/dL
Leukocytes,Ua: NEGATIVE
Nitrite: NEGATIVE
Protein, ur: NEGATIVE mg/dL
Specific Gravity, Urine: 1.008 (ref 1.005–1.030)
pH: 5 (ref 5.0–8.0)

## 2021-08-24 LAB — TROPONIN I (HIGH SENSITIVITY): Troponin I (High Sensitivity): 2 ng/L (ref <18)

## 2021-08-24 NOTE — Discharge Instructions (Signed)
Return if you are having any problems.

## 2021-08-27 NOTE — Progress Notes (Signed)
[name], [place] [address]   CLINIC:  Medical Oncology/Hematology  PCP:  Celene Squibb, MD [place] [id number] [phone]   REASON FOR VISIT:  Follow-up for anemia and thrombocytopenia  PRIOR THERAPY: None  CURRENT THERAPY: Under work-up  INTERVAL HISTORY:  [name] 81 y.o. female returns for routine follow-up of her chronic thrombocytopenia and anemia.  She was last seen by Tarri Abernethy PA-C on 06/12/2021.    At today's visit, she reports feeling fairly well apart from back pain. Since her last visit, she was hospitalized from 06/18/2021 through 06/22/2021 for dyspnea in the setting of respiratory illness and history of COPD.  Since last visit, she was started on daily copper supplement and received IV iron x2 on 06/26/2021 through 07/03/2021.  She does note some slight improvement in her energy after her IV iron, but does continue to have fatigue that "comes and goes", as well as some generalized weakness and lightheadedness. She had a solitary episode of short-lived chest pain and related ED visit on 08/23/2021, but cardiac etiology was ruled out. She denies any syncopal episodes, palpitations, pica, restless legs, or headaches. Chronic dyspnea on exertion from COPD is stable. She continues to take Eliquis for atrial fibrillation. She reports a single episode of bright red blood in the toilet after straining for bowel movement.  She has not had any melena or epistaxis.  She reports her current energy is 60%.  Appetite is improved and she is starting to gain back some of the weight she lost in the past 6 months.   REVIEW OF SYSTEMS:    Review of Systems  Constitutional:  Positive for fatigue. Negative for appetite change, chills, diaphoresis, fever and unexpected weight change.  HENT:   Negative for lump/mass and nosebleeds.   Eyes:  Negative for eye problems.  Respiratory:  Positive for shortness of breath  (COPD). Negative for cough and hemoptysis.   Cardiovascular:  Positive for chest pain (none today). Negative for leg swelling and palpitations.  Gastrointestinal:  Positive for constipation. Negative for abdominal pain, blood in stool, diarrhea, nausea and vomiting.  Genitourinary:  Negative for hematuria.   Musculoskeletal:  Positive for back pain.  Skin: Negative.   Neurological:  Positive for numbness. Negative for dizziness, headaches and light-headedness.  Hematological:  Does not bruise/bleed easily.     PAST MEDICAL/SURGICAL HISTORY:  Past Medical History:  Diagnosis Date   A-fib [facility])    Atrial flutter ([facility])    On Eliquis in 8/17 but discontinued after stent placement   Breast cancer ([facility])    remote   CAD in native artery 03/16/2016   COPD (chronic obstructive pulmonary disease) ([facility])    Hypertension    Iron deficiency anemia 06/12/2021   NSTEMI (non-ST elevated myocardial infarction) ([facility])    2017   S/P angioplasty with stent 03/15/16 DES Resolute, pLCX 03/16/2016   Vitreous hemorrhage of left eye ([facility]) 08/01/2019   Past Surgical History:  Procedure Laterality Date   APPENDECTOMY     BREAST SURGERY Left    CARDIAC CATHETERIZATION N/A 03/15/2016   Procedure: Left Heart Cath and Coronary Angiography;  Surgeon: Belva Crome, MD;  Location: [facility];  Service: Cardiovascular;  Laterality: N/A;   CARDIAC CATHETERIZATION N/A 03/15/2016   Procedure: Coronary Stent Intervention;  Surgeon: Belva Crome, MD;  Location: [facility];  Service: Cardiovascular;  Laterality: N/A;   COLONOSCOPY  remote   COLONOSCOPY N/A 05/26/2016   Procedure: COLONOSCOPY;  Surgeon: Daneil Dolin, MD;  [facility]: [facility];  Service: Endoscopy;  Laterality: N/A;  845   ESOPHAGOGASTRODUODENOSCOPY N/A 05/26/2016   Procedure: ESOPHAGOGASTRODUODENOSCOPY (EGD);  Surgeon: Daneil Dolin, MD;  [facility];  Service: Endoscopy;  Laterality: N/A;   MALONEY DILATION N/A  05/26/2016   Procedure: Venia Minks DILATION;  Surgeon: Daneil Dolin, MD;  [facility];  Service: Endoscopy;  Laterality: N/A;   VITRECTOMY Left 10/03/2019   Dr. Zadie Rhine, Vitrectomy, Focal Laser, Removal of Silicone Oil     SOCIAL HISTORY:  Social History   Socioeconomic History   Marital status: Divorced    Spouse name: Not on file   Number of children: 1   Years of education: Not on file   Highest education level: Not on file  Occupational History   Occupation: Retired  Tobacco Use   Smoking status: Former    Types: Cigarettes    Quit date: 12/28/2014    Years since quitting: 6.6   Smokeless tobacco: Never   Tobacco comments:    Patient quit within the last 10 years  Vaping Use   Vaping Use: Never used  Substance and Sexual Activity   Alcohol use: Never   Drug use: Never   Sexual activity: Not Currently  Other Topics Concern   Not on file  Social History Narrative   ** Merged History Encounter **       Social Determinants of Health   Financial Resource Strain: Not on file  Food Insecurity: Not on file  Transportation Needs: Not on file  Physical Activity: Not on file  Stress: Not on file  Social Connections: Not on file  Intimate Partner Violence: Not on file    FAMILY HISTORY:  Family History  Problem Relation Age of Onset   Heart disease Mother    COPD Father    Cancer Sister        unknown primary   Cancer Brother        unknown primary   Cancer Brother    Hypertension Son    Colon cancer Neg Hx     CURRENT MEDICATIONS:  Outpatient Encounter Medications as of 08/28/2021  Medication Sig Note   acetaminophen (TYLENOL) 500 MG tablet Take 1,500 mg by mouth every 4 (four) hours as needed for mild pain.    albuterol (PROVENTIL) (2.5 MG/3ML) 0.083% nebulizer solution Take 2.5 mg by nebulization every 6 (six) hours as needed for wheezing or shortness of breath.    albuterol (VENTOLIN HFA) 108 (90 Base) MCG/ACT inhaler Inhale 2 puffs into the  lungs every 6 (six) hours as needed for wheezing or shortness of breath.    amLODipine-olmesartan (AZOR) 10-40 MG tablet Take 1 tablet by mouth every morning.    buPROPion (WELLBUTRIN XL) 300 MG 24 hr tablet Take 300 mg by mouth daily.    Cholecalciferol (VITAMIN D3) 50 MCG (2000 UT) TABS Take 2,000 Units by mouth daily.    denosumab (PROLIA) 60 MG/ML SOSY injection Inject 60 mg into the skin every 6 (six) months. 06/19/2021: Due 21st of March   ELIQUIS 5 MG TABS tablet TAKE ONE TABLET (5MG TOTAL) BY MOUTH TWOTIMES DAILY    fluticasone (FLONASE) 50 MCG/ACT nasal spray Place 2 sprays into both nostrils daily.    levothyroxine (SYNTHROID) 50 MCG tablet Take 50 mcg by mouth daily before breakfast.     loratadine (CLARITIN) 10 MG tablet Take 1 tablet (  10 mg total) by mouth daily.    metoprolol succinate (TOPROL-XL) 50 MG 24 hr tablet Take 50 mg by mouth daily.    nitroGLYCERIN (NITROSTAT) 0.4 MG SL tablet Place 0.4 mg under the tongue every 5 (five) minutes as needed for chest pain.    rosuvastatin (CRESTOR) 20 MG tablet Take 20 mg by mouth daily.     sertraline (ZOLOFT) 25 MG tablet Take 25 mg by mouth daily.    TRELEGY ELLIPTA 100-62.5-25 MCG/ACT AEPB Inhale 1 puff into the lungs daily.    vitamin B-12 (CYANOCOBALAMIN) 100 MCG tablet Take 100 mcg by mouth daily.    No facility-administered encounter medications on file as of 08/28/2021.    ALLERGIES:  Allergies  Allergen Reactions   Sulfa Antibiotics Hives   Sulfa Antibiotics Hives and Other (See Comments)    Reaction not recalled by the patient, but "hives" were noted in her other profile in Epic     PHYSICAL EXAM:  ECOG PERFORMANCE STATUS: 1 - Symptomatic but completely ambulatory    There were no vitals filed for this visit. There were no vitals filed for this visit.   Physical Exam Constitutional:      Appearance: Normal appearance.  HENT:     Head: Normocephalic and atraumatic.     Mouth/Throat:     Mouth: Mucous membranes are  moist.  Eyes:     Extraocular Movements: Extraocular movements intact.     Pupils: Pupils are equal, round, and reactive to light.  Cardiovascular:     Rate and Rhythm: Regular rhythm. Bradycardia present.     Pulses: Normal pulses.     Heart sounds: Normal heart sounds.  Pulmonary:     Effort: Pulmonary effort is normal.     Breath sounds: Normal breath sounds. Decreased air movement present.  Abdominal:     General: Bowel sounds are normal.     Palpations: Abdomen is soft.     Tenderness: There is no abdominal tenderness.  Musculoskeletal:        General: No swelling.     Right lower leg: No edema.     Left lower leg: No edema.  Lymphadenopathy:     Cervical: No cervical adenopathy.  Skin:    General: Skin is warm and dry.     Findings: Bruising present.  Neurological:     General: No focal deficit present.     Mental Status: She is alert and oriented to person, place, and time.  Psychiatric:        Mood and Affect: Mood normal.        Behavior: Behavior normal.     LABORATORY DATA:  I have reviewed the labs as listed.  CBC    Component Value Date/Time   WBC 5.7 08/23/2021 2205   RBC 3.73 (L) 08/23/2021 2205   HGB 11.3 (L) 08/23/2021 2205   HCT 35.8 (L) 08/23/2021 2205   PLT  08/23/2021 2205    PLATELET CLUMPS NOTED ON SMEAR, COUNT APPEARS ADEQUATE   MCV 96.0 08/23/2021 2205   MCH 30.3 08/23/2021 2205   MCHC 31.6 08/23/2021 2205   RDW 15.5 08/23/2021 2205   LYMPHSABS 1.1 08/21/2021 1018   MONOABS 0.4 08/21/2021 1018   EOSABS 0.2 08/21/2021 1018   BASOSABS 0.0 08/21/2021 1018      Latest Ref Rng & Units 08/23/2021   10:05 PM 06/22/2021    5:20 AM 06/21/2021    8:03 AM  CMP  Glucose 70 - 99 mg/dL 124  92   88    BUN 8 - 23 mg/dL 41   27   30    Creatinine 0.44 - 1.00 mg/dL 1.51   1.11   1.04    Sodium 135 - 145 mmol/L 141   143   143    Potassium 3.5 - 5.1 mmol/L 4.4   3.7   4.3    Chloride 98 - 111 mmol/L 116   108   106    CO2 22 - 32 mmol/L 20   25    29     Calcium 8.9 - 10.3 mg/dL 9.1   9.1   9.0      DIAGNOSTIC IMAGING:  I have independently reviewed the relevant imaging and discussed with the patient.  ASSESSMENT & PLAN: 1.  Anemia, secondary to copper and iron deficiency and CKD - Onset of anemia noted on 05/19/2021, with Hgb 8.0/MCV 101.4.  Prior to this, she had Hgb 12.1 in July 2022.  Labs sent by PCP (05/11/2021): Hgb 9.2/MCV 96.  Creatinine was at baseline 1.38. - Record review does show severe anemia in December 2017/January 2018 with Hgb 6.5, which was due to iron deficiency of unknown source.  She received PRBC x2. 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  B12/methylmalonic acid, folate normal.  Normal SPEP/immunofixation.  DAT, haptoglobin, reticulocytes normal.  CMP showed baseline CKD stage IIIa/b - She was Hemoccult negative in January 2018.  Most recent Hemoccult stool negative x2. - She takes Eliquis for atrial fibrillation - She was unable to tolerate iron pill daily due to significant constipation   - She has occasional bright red blood in the toilet after straining for bowel movements.  No melena or epistaxis.  - She has some chronic residual fatigue, but otherwise improved after IV iron - Most recent lab panel (08/21/2021) ferritin 238, iron saturation 32%, copper 100 - Most recent CBC (08/23/2021): Hgb 11.3/MCV 96.0 (improved from Hgb 7.7 on 05/26/2021) - PLAN: No indication for IV iron at this time. - Continue copper supplement 5 mg once daily - Repeat labs and RTC in 4 months - Anemia has improved significantly after iron/copper repletion, so we will defer any bone marrow biopsy for the present.     2.  Thrombocytopenia, mild to moderate - Seen at the request of Dr. Nevada Crane for platelet count of 62.  She  has some easy bruising. - She is on Eliquis for atrial fibrillation. - Hematologic work-up showed SPEP negative.  B12, methylmalonic acid, copper and folic acid levels were normal when checked in March 2021.  LDH was normal.  White count and platelet count was normal. - ANA was positive with a dilution of 1 and 1280.  (She has positive Raynaud's phenomena.  Evaluated by rheumatology.)  Rheumatoid factor is negative.  Hepatitis panel was negative. - She admits to easy bruising but denies any signs or symptoms of major blood loss    - CBC from 06/21/2021 showed platelets 125.  CBC on 08/21/2021 and 08/23/2021 with platelet clumping, unable to estimate platelet count. - Differential diagnosis favors pseudothrombocytopenia due to platelet clumping, versus ITP, versus early MDS - PLAN: We will continue monitor with CBC at least every 6 months.  Will consider bone marrow biopsy if any significant deviations from baseline or if patient develops B symptoms.   3.  Vitamin  D deficiency - Patient was started on vitamin D 1000 units daily at her last visit - Most recent labs (05/19/2021): Vitamin D normal at 58.59 - PLAN: Continue vitamin D supplementation.  We will recheck vitamin D levels in 4 months.   4.  Tobacco abuse - Smokes 2 PPD x60 years - CT chest lung cancer screening in November 2022 was lung RADS 2, benign - PLAN: Patient will continue her annual LDCT scan via NP Eric Form   5.  Other history  - She has a remote history of breast cancer - She has history of atrial fibrillation/flutter on Eliquis - She has COPD   All questions were answered. The patient knows to call the clinic with any problems, questions or concerns.  Medical decision making: Moderate  Time spent on visit: I spent 20 minutes counseling the patient face to face. The total time spent in the appointment was 30 minutes and more than 50% was on counseling.   Harriett Rush, PA-C  08/28/2021 12:28 PM

## 2021-08-28 ENCOUNTER — Inpatient Hospital Stay (HOSPITAL_BASED_OUTPATIENT_CLINIC_OR_DEPARTMENT_OTHER): Payer: Medicare Other | Admitting: Physician Assistant

## 2021-08-28 VITALS — BP 120/53 | HR 51 | Temp 97.5°F | Resp 18 | Ht 66.0 in | Wt 141.8 lb

## 2021-08-28 DIAGNOSIS — D649 Anemia, unspecified: Secondary | ICD-10-CM

## 2021-08-28 DIAGNOSIS — D696 Thrombocytopenia, unspecified: Secondary | ICD-10-CM | POA: Diagnosis not present

## 2021-08-28 DIAGNOSIS — I10 Essential (primary) hypertension: Secondary | ICD-10-CM | POA: Diagnosis not present

## 2021-08-28 DIAGNOSIS — Z853 Personal history of malignant neoplasm of breast: Secondary | ICD-10-CM | POA: Diagnosis not present

## 2021-08-28 DIAGNOSIS — K5900 Constipation, unspecified: Secondary | ICD-10-CM | POA: Diagnosis not present

## 2021-08-28 DIAGNOSIS — N183 Chronic kidney disease, stage 3 unspecified: Secondary | ICD-10-CM

## 2021-08-28 DIAGNOSIS — Z882 Allergy status to sulfonamides status: Secondary | ICD-10-CM | POA: Diagnosis not present

## 2021-08-28 DIAGNOSIS — E559 Vitamin D deficiency, unspecified: Secondary | ICD-10-CM | POA: Diagnosis not present

## 2021-08-28 DIAGNOSIS — D631 Anemia in chronic kidney disease: Secondary | ICD-10-CM | POA: Diagnosis not present

## 2021-08-28 DIAGNOSIS — J449 Chronic obstructive pulmonary disease, unspecified: Secondary | ICD-10-CM | POA: Diagnosis not present

## 2021-08-28 DIAGNOSIS — Z9049 Acquired absence of other specified parts of digestive tract: Secondary | ICD-10-CM | POA: Diagnosis not present

## 2021-08-28 DIAGNOSIS — Z836 Family history of other diseases of the respiratory system: Secondary | ICD-10-CM | POA: Diagnosis not present

## 2021-08-28 DIAGNOSIS — R42 Dizziness and giddiness: Secondary | ICD-10-CM | POA: Diagnosis not present

## 2021-08-28 DIAGNOSIS — M549 Dorsalgia, unspecified: Secondary | ICD-10-CM | POA: Diagnosis not present

## 2021-08-28 DIAGNOSIS — D509 Iron deficiency anemia, unspecified: Secondary | ICD-10-CM

## 2021-08-28 DIAGNOSIS — E610 Copper deficiency: Secondary | ICD-10-CM | POA: Diagnosis not present

## 2021-08-28 DIAGNOSIS — Z79899 Other long term (current) drug therapy: Secondary | ICD-10-CM | POA: Diagnosis not present

## 2021-08-28 DIAGNOSIS — I252 Old myocardial infarction: Secondary | ICD-10-CM | POA: Diagnosis not present

## 2021-08-28 DIAGNOSIS — I2510 Atherosclerotic heart disease of native coronary artery without angina pectoris: Secondary | ICD-10-CM | POA: Diagnosis not present

## 2021-08-28 DIAGNOSIS — I4891 Unspecified atrial fibrillation: Secondary | ICD-10-CM | POA: Diagnosis not present

## 2021-08-28 DIAGNOSIS — R0602 Shortness of breath: Secondary | ICD-10-CM | POA: Diagnosis not present

## 2021-08-28 DIAGNOSIS — Z809 Family history of malignant neoplasm, unspecified: Secondary | ICD-10-CM | POA: Diagnosis not present

## 2021-08-28 DIAGNOSIS — Z87891 Personal history of nicotine dependence: Secondary | ICD-10-CM | POA: Diagnosis not present

## 2021-08-28 DIAGNOSIS — R531 Weakness: Secondary | ICD-10-CM | POA: Diagnosis not present

## 2021-08-28 DIAGNOSIS — R079 Chest pain, unspecified: Secondary | ICD-10-CM | POA: Diagnosis not present

## 2021-08-28 DIAGNOSIS — Z8249 Family history of ischemic heart disease and other diseases of the circulatory system: Secondary | ICD-10-CM | POA: Diagnosis not present

## 2021-08-28 DIAGNOSIS — Z7901 Long term (current) use of anticoagulants: Secondary | ICD-10-CM | POA: Diagnosis not present

## 2021-08-28 NOTE — Patient Instructions (Signed)
[place] at [facility] Discharge Instructions  You were seen today by Tarri Abernethy PA-C for your anemia.  Your blood levels look much better!  You are still mildly anemic, which may be related to your chronic kidney diease.  Your iron levels look excellent!  You do not need any IV iron right now.  Your copper levels have normalized.  Continue to take copper 5 mg tablet once daily.  Your platelets are still mildly low, but this is not a cause for concern at this time.  We will continue to monitor.    LABS: Return in 4 months for repeat labs   FOLLOW-UP APPOINTMENT: Phone visit in 4 months, after labs   Thank you for choosing [facility] at [facility] to provide your oncology and hematology care.  To afford each patient quality time with our provider, please arrive at least 15 minutes before your scheduled appointment time.   If you have a lab appointment with the [facility] please come in thru the Main Entrance and check in at the main information desk.  You need to re-schedule your appointment should you arrive 10 or more minutes late.  We strive to give you quality time with our providers, and arriving late affects you and other patients whose appointments are after yours.  Also, if you no show three or more times for appointments you may be dismissed from the clinic at the providers discretion.     Again, thank you for choosing [facility].  Our hope is that these requests will decrease the amount of time that you wait before being seen by our physicians.       _____________________________________________________________  Should you have questions after your visit to [facility], please contact our office at [phone] and follow the prompts.  Our office hours are 8:00 a.m. and 4:30 p.m. Monday - Friday.  Please note that voicemails left after 4:00 p.m. may not be returned until the following business  day.  We are closed weekends and major holidays.  You do have access to a nurse 24-7, just call the main number to the clinic [phone] and do not press any options, hold on the line and a nurse will answer the phone.    For prescription refill requests, have your pharmacy contact our office and allow 72 hours.    Due to Covid, you will need to wear a mask upon entering the hospital. If you do not have a mask, a mask will be given to you at the Main Entrance upon arrival. For doctor visits, patients may have 1 support person age 52 or older with them. For treatment visits, patients can not have anyone with them due to social distancing guidelines and our immunocompromised population.

## 2021-09-02 ENCOUNTER — Telehealth: Payer: Self-pay | Admitting: Cardiology

## 2021-09-02 DIAGNOSIS — R079 Chest pain, unspecified: Secondary | ICD-10-CM | POA: Diagnosis not present

## 2021-09-02 DIAGNOSIS — K5900 Constipation, unspecified: Secondary | ICD-10-CM | POA: Diagnosis not present

## 2021-09-02 DIAGNOSIS — M545 Low back pain, unspecified: Secondary | ICD-10-CM | POA: Diagnosis not present

## 2021-09-02 DIAGNOSIS — R001 Bradycardia, unspecified: Secondary | ICD-10-CM | POA: Diagnosis not present

## 2021-09-02 MED ORDER — METOPROLOL SUCCINATE ER 25 MG PO TB24: 25.0000 mg | ORAL_TABLET | Freq: Every day | ORAL | 3 refills | Status: DC

## 2021-09-02 NOTE — Telephone Encounter (Signed)
Pt c/o medication issue:  1. Name of Medication:   metoprolol succinate (TOPROL-XL) 50 MG 24 hr tablet    2. How are you currently taking this medication (dosage and times per day)? Take 50 mg by mouth daily  3. Are you having a reaction (difficulty breathing--STAT)? No  4. What is your medication issue? Pt's PCP states that pt was seen in their office today and pt's HR was 44. PCP is wanting to know if he can decrease medication down to 25MG. Please advise

## 2021-09-02 NOTE — Telephone Encounter (Signed)
Spoke to Bri at Dr. Juel Burrow office and verbalized the okay from Dr. Radford Pax to decrease pt's Toprol-XL to 25 mg QD. Will update pt's chart.

## 2021-09-17 NOTE — Progress Notes (Signed)
Office Visit Note  Patient: Patricia Jackson             Date of Birth: 06-02-1940           MRN: [id number]             PCP: Benita Stabile, MD Referring: Benita Stabile, MD Visit Date: 10/01/2021 Occupation: @GUAROCC @  Subjective:  Low back pain   History of Present Illness: Patricia Jackson is a 81 y.o. female with history of positive ANA and osteoarthritis.  Patient was last seen in the office on 10/02/2020.  Patient denies any new questions or concerns since her last office visit.  She continues to experience intermittent symptoms of Raynaud's and arthritis.  She has not had any oral or nasal ulcerations.  She denies any recent rashes.  She denies any swollen lymph nodes.  Her energy level has been stable and she has been sleeping okay at night.  She denies any increased joint pain or joint swelling.  She denies any falls or fractures.  She is taking vitamin D supplement daily and continues to have Prolia injections every 6 months.  She had an updated bone density on 06/29/2021 ordered by her PCP.    Activities of Daily Living:  Patient reports morning stiffness for 0 minutes.   Patient Denies nocturnal pain.  Difficulty dressing/grooming: Denies Difficulty climbing stairs: Reports Difficulty getting out of chair: Reports Difficulty using hands for taps, buttons, cutlery, and/or writing: Denies  Review of Systems  Constitutional:  Positive for fatigue.  HENT:  Positive for hearing loss and mouth dryness. Negative for mouth sores and nose dryness.   Eyes:  Positive for visual disturbance and dryness.       Has lost vision in left eye and also has bleeding in right eye  Respiratory:  Positive for cough and shortness of breath.        Cough in the morning   Cardiovascular:  Positive for chest pain.  Gastrointestinal:  Positive for constipation. Negative for abdominal pain.  Endocrine: Positive for cold intolerance.       Hands fingers get cold and are painful for a couple years   Genitourinary: Negative.  Negative for nocturia.  Musculoskeletal:  Positive for joint pain, gait problem and joint pain.       Lower back   Skin:  Negative for rash, hair loss and sensitivity to sunlight.  Allergic/Immunologic:       Denies recent infections, is waiting on dental work for extractions  Neurological:  Positive for dizziness and light-headedness.  Hematological:  Positive for bruising/bleeding tendency. Negative for swollen glands.       On blood thinner  Psychiatric/Behavioral: Negative.      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   Traction detachment of left retina 08/03/2019   Left retinal detachment 08/01/2019   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HCC) 08/01/2019   Retinal hemorrhage of left eye 08/01/2019   Advanced nonexudative age-related macular degeneration of left eye with subfoveal involvement 08/01/2019   Total retinal detachment of left eye 07/26/2019   Choroidal detachment of  right eye 07/26/2019   Exudative retinopathy of left eye 07/26/2019   Thrombocytopenia (HCC) 07/04/2019   Orthostatic syncope 08/27/2017   Atypical chest pain 08/27/2017   Atrial flutter (HCC) 10/28/2016   CKD (chronic kidney disease), stage IV (HCC) 10/28/2016   COPD (chronic obstructive pulmonary disease) (HCC) 10/28/2016   Depression 10/28/2016   Esophageal dysphagia    Anemia 04/07/2016   Sinus bradycardia    CAD in native artery 03/16/2016   S/P angioplasty with stent 03/15/16 DES Resolute, pLCX 03/16/2016   NSTEMI (non-ST elevated myocardial  infarction) (HCC) 03/12/2016   Dyspnea on exertion    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HCC) 10/31/2015   Hyperlipidemia 10/31/2015   Atrial flutter with rapid ventricular response (HCC) 10/30/2015   Back pain 11/26/2013   Arm pain 11/26/2013   Chest pain 11/26/2013   Hypertension     Past Medical History:  Diagnosis Date   A-fib [facility])    Atrial flutter (HCC)    On Eliquis in 8/17 but discontinued after stent placement   Breast cancer (HCC)    remote   CAD in native artery 03/16/2016   COPD (chronic obstructive pulmonary disease) (HCC)    Hypertension    Iron deficiency anemia 06/12/2021   NSTEMI (non-ST elevated myocardial infarction) (HCC)    2017   S/P angioplasty with stent 03/15/16 DES Resolute, pLCX 03/16/2016   Vitreous hemorrhage of left eye (HCC) 08/01/2019    Family History  Problem Relation Age of Onset   Heart disease Mother    COPD Father    Cancer Sister        unknown primary   Cancer Brother        unknown primary   Cancer Brother    Hypertension Son    Colon cancer Neg Hx    Past Surgical History:  Procedure Laterality Date   APPENDECTOMY     BREAST SURGERY Left    CARDIAC CATHETERIZATION N/A 03/15/2016   Procedure: Left Heart Cath and Coronary Angiography;  Surgeon: Lyn Records, MD;  Location[facility] INVASIVE CV LAB;  Service: Cardiovascular;  Laterality: N/A;   CARDIAC CATHETERIZATION N/A 03/15/2016   Procedure: Coronary Stent Intervention;  Surgeon: Lyn Records, MD;  Location: [facility] INVASIVE CV LAB;  Service: Cardiovascular;  Laterality: N/A;   COLONOSCOPY     remote   COLONOSCOPY N/A 05/26/2016   Procedure: COLONOSCOPY;  Surgeon: Corbin Ade, MD;  Location: [facility] ENDO SUITE;  Service: Endoscopy;  Laterality: N/A;  845   ESOPHAGOGASTRODUODENOSCOPY N/A 05/26/2016   Procedure: ESOPHAGOGASTRODUODENOSCOPY (EGD);  Surgeon: Corbin Ade, MD;  Location: [facility] ENDO SUITE;  Service: Endoscopy;  Laterality: N/A;   MALONEY DILATION N/A 05/26/2016   Procedure: Elease Hashimoto  DILATION;  Surgeon: Corbin Ade, MD;  Location: [facility] ENDO SUITE;  Service: Endoscopy;  Laterality: N/A;   VITRECTOMY Left 10/03/2019   Dr. Luciana Axe, Vitrectomy, Focal Laser, Removal of Silicone Oil   Social History   Social History Narrative   ** Merged History Encounter **       Immunization History  Administered Date(s) Administered   Influenza,inj,Quad PF,6+ Mos 01/27/2016   Moderna Sars-Covid-2 Vaccination 06/15/2019, 07/16/2019   Tdap 09/04/2019     Objective: Vital Signs: BP (!) 148/62   Pulse 65   Ht 5\' 8"  (1.727 m)   Wt 140 lb (63.5 kg)   BMI 21.29 kg/m    Physical Exam Vitals and nursing note reviewed.  Constitutional:      Appearance: She is well-developed.  HENT:  Head: Normocephalic and atraumatic.  Eyes:     Conjunctiva/sclera: Conjunctivae normal.  Cardiovascular:     Rate and Rhythm: Normal rate and regular rhythm.     Heart sounds: Normal heart sounds.  Pulmonary:     Effort: Pulmonary effort is normal.     Breath sounds: Normal breath sounds.  Abdominal:     General: Bowel sounds are normal.     Palpations: Abdomen is soft.  Musculoskeletal:     Cervical back: Normal range of motion.  Skin:    General: Skin is warm and dry.     Capillary Refill: Capillary refill takes less than 2 seconds.  Neurological:     Mental Status: She is alert and oriented to person, place, and time.  Psychiatric:        Behavior: Behavior normal.      Musculoskeletal Exam: C-spine has good range of motion.  Postural thoracic kyphosis noted.  Some midline spinal tenderness in the lumbar region.  Shoulder joints, elbow joints, wrist joints, MCPs, PIPs, DIPs have good range of motion with no synovitis.  PIP and DIP thickening consistent with osteoarthritis of both hands.  No synovitis was noted.  Hip joints have slightly limited range of motion with no groin pain.  Knee joints have good range of motion with no warmth or effusion.  Ankle joints have good range of motion  with no tenderness or joint swelling.  CDAI Exam: CDAI Score: -- Patient Global: --; Provider Global: -- Swollen: --; Tender: -- Joint Exam 10/01/2021   No joint exam has been documented for this visit   There is currently no information documented on the homunculus. Go to the Rheumatology activity and complete the homunculus joint exam.  Investigation: No additional findings.  Imaging: Panretinal Photocoagulation - OD - Right Eye  Result Date: 09/21/2021 Anesthesia Topical anesthesia was used. Anesthetic medications included Proparacaine 0.5%. Laser Information The type of laser was diode. Color was yellow. The duration in seconds was 0.03. The spot size was 390 microns. Laser power was 340. Total spots was 583. Post-op The patient tolerated the procedure well. There were no complications. The patient received written and verbal post procedure care education. Notes PRP applied superiorly and nasally near the equator and anterior  Color Fundus Photography Optos - OU - Both Eyes  Result Date: 09/21/2021 Right Eye Progression has improved. Disc findings include normal observations. Macula : normal observations. Vessels : normal observations. Periphery : hemorrhage. Left Eye Progression has been stable. Disc findings include normal observations. Macula : normal observations. Vessels : normal observations. Notes Subretinal and choroidal hemorrhage of PECHR, temporally, now resolving into dark subretinal hemorrhage and fibrosis, no areas of activity.  Good peripheral PRP will continue to monitor the nasal quadrants.  We will deliver nasal PRP OD today OS clear media and attached after repair of retinal detachment from [facility] some years previous, nearly 360 resolved post vitrectomy clearance of vitreous hemorrhage silicone oil injection and subsequent peripheral PRP   Recent Labs: Lab Results  Component Value Date   WBC 5.7 08/23/2021   HGB 11.3 (L) 08/23/2021   PLT  08/23/2021    PLATELET  CLUMPS NOTED ON SMEAR, COUNT APPEARS ADEQUATE   NA 141 08/23/2021   K 4.4 08/23/2021   CL 116 (H) 08/23/2021   CO2 20 (L) 08/23/2021   GLUCOSE 124 (H) 08/23/2021   BUN 41 (H) 08/23/2021   CREATININE 1.51 (H) 08/23/2021   BILITOT 0.5 06/19/2021   ALKPHOS 54 06/19/2021  AST 29 06/19/2021   ALT 20 06/19/2021   PROT 6.5 06/19/2021   ALBUMIN 3.8 06/19/2021   CALCIUM 9.1 08/23/2021   GFRAA 38 (L) 07/06/2019    Speciality Comments: No specialty comments available.  Procedures:  No procedures performed Allergies: Sulfa antibiotics and Sulfa antibiotics   Assessment / Plan:     Visit Diagnoses: Positive ANA (antinuclear antibody) - ANA 1: 1280NH, CB CAP BC 4d+, aPS IgM >150, thrombocytopenia, history of Raynauds and sicca symptoms: She has no clinical features of autoimmune disease at this time.  She does not require immunosuppressive agents.  She experiences intermittent symptoms of Raynaud's but has no signs of sclerodactyly or digital ulcerations.  She has ongoing dry mouth so we discussed the use of over-the-counter products including Biotene and XyliMelts.  She has not had any oral or nasal ulcerations.  No recent rashes or hair loss.  No cervical lymphadenopathy.  She experiences some coughing and shortness of breath secondary to COPD.  The following lab work will be updated today for routine monitoring.  She was advised to notify us if she develops any new or worsening symptoms.  She will follow-up in the office in 1 year or sooner if needed.- Plan: ANA, Anti-scleroderma antibody, COMPLETE METABOLIC PANEL WITH GFR, Urinalysis, Routine w reflex microscopic, CBC with Differential/Platelet, Anti-DNA antibody, double-stranded, C3 and C4, Sedimentation rate, Phosphatidylserine/Prothrombin (PS/PT) Antibodies (IgG, IgM)  Raynaud's syndrome without gangrene - She continues to experience intermittent symptoms of Raynaud's in her fingertips.  She has no signs of sclerodactyly or digital ulcerations  on examination today.  She was given an informational handout about Raynaud's to review.  Discussed the importance of avoiding triggers.  She was advised to notify us if she develops any new or worsening symptoms.  Plan: ANA, Anti-scleroderma antibody  Trapezius muscle spasm: She is not experiencing any muscle spasms currently.  Primary osteoarthritis of both hands: She has PIP and DIP thickening consistent with osteoarthritis of both hands.  No tenderness or inflammation was noted on examination today.  She was able to make a complete fist bilaterally.  Discussed the importance of joint protection and muscle strengthening.  Trochanteric bursitis, left hip: Resolved  History of COPD -Previously followed by Dr. Isaiah Serge. HRCT 2018 was negative for interstitial lung disease.  Age-related osteoporosis without current pathological fracture -Followed by Dr. [name]. DEXA updated on 11/17/17 BMD measured at Femur Neck Right is 0.655 g/cm2 with a T-scoreof -2.8. DEXA updated on 06/29/21: The BMD measured at Femur Neck Left is 0.668 g/cm2 with a T-score of -2.7.  She is taking vitamin D 2000 units daily.  She remains on Prolia 60 mg subcu as injections every 6 months ordered by her PCP.  Thrombocytopenia (HCC): CBC updated today.  Other medical conditions are listed as follows:   Essential hypertension  Atrial flutter with rapid ventricular response (HCC)  CAD in native artery  NSTEMI (non-ST elevated myocardial infarction) (HCC)  S/P angioplasty with stent 03/15/16 DES Resolute, pLCX  Sinus bradycardia  Dyslipidemia  Esophageal dysphagia  Exudative age-related macular degeneration of left eye with active choroidal neovascularization (HCC)  CKD (chronic kidney disease), stage IV (HCC)  Orders: Orders Placed This Encounter  Procedures   ANA   Anti-scleroderma antibody   COMPLETE METABOLIC PANEL WITH GFR   Urinalysis, Routine w reflex microscopic   CBC with Differential/Platelet   Anti-DNA  antibody, double-stranded   C3 and C4   Sedimentation rate   Phosphatidylserine/Prothrombin (PS/PT) Antibodies (IgG, IgM)   No orders  of the defined types were placed in this encounter.    Follow-Up Instructions: Return in about 1 year (around 10/02/2022) for +ANA, Osteoarthritis.   Gearldine Bienenstock, PA-C  Note - This record has been created using Dragon software.  Chart creation errors have been sought, but may not always  have been located. Such creation errors do not reflect on  the standard of medical care.

## 2021-09-21 ENCOUNTER — Encounter (INDEPENDENT_AMBULATORY_CARE_PROVIDER_SITE_OTHER): Payer: Medicare Other | Admitting: Ophthalmology

## 2021-09-21 ENCOUNTER — Ambulatory Visit (INDEPENDENT_AMBULATORY_CARE_PROVIDER_SITE_OTHER): Payer: Medicare Other | Admitting: Ophthalmology

## 2021-09-21 ENCOUNTER — Encounter (INDEPENDENT_AMBULATORY_CARE_PROVIDER_SITE_OTHER): Payer: Self-pay | Admitting: Ophthalmology

## 2021-09-21 DIAGNOSIS — H31401 Unspecified choroidal detachment, right eye: Secondary | ICD-10-CM

## 2021-09-21 DIAGNOSIS — H35022 Exudative retinopathy, left eye: Secondary | ICD-10-CM

## 2021-09-21 DIAGNOSIS — H35021 Exudative retinopathy, right eye: Secondary | ICD-10-CM | POA: Diagnosis not present

## 2021-09-21 DIAGNOSIS — H33052 Total retinal detachment, left eye: Secondary | ICD-10-CM

## 2021-09-21 DIAGNOSIS — H35371 Puckering of macula, right eye: Secondary | ICD-10-CM

## 2021-09-21 NOTE — Assessment & Plan Note (Signed)
No new signs of ischemic disease no hemorrhagic changes

## 2021-09-21 NOTE — Progress Notes (Signed)
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 female who presents to the clinic today for:   HPI   2 MOS for DILATE OU, COLOR FP, PRP, OD to nasal retina Pt reports, "Ever since my surgery, everything seems more bright in my left eye. When I look outside and see a green tree, it looks like its snowing. But In my right eye, I could see the green but its like something is right in front of it."  Pt stated no changes in vision since last visit.  Last edited by Silvestre Moment on 09/21/2021  8:28 AM.      Referring physician: Celene Squibb, MD [place],  [place] [address]  HISTORICAL INFORMATION:   Selected notes from the MEDICAL RECORD NUMBER    Lab Results  Component Value Date   HGBA1C 5.6 10/30/2015     CURRENT MEDICATIONS: No current outpatient medications on file. (Ophthalmic Drugs)   No current facility-administered medications for this visit. (Ophthalmic Drugs)   Current Outpatient Medications (Other)  Medication Sig   acetaminophen (TYLENOL) 500 MG tablet Take 1,500 mg by mouth every 4 (four) hours as needed for mild pain.   albuterol (PROVENTIL) (2.5 MG/3ML) 0.083% nebulizer solution Take 2.5 mg by nebulization every 6 (six) hours as needed for wheezing or shortness of breath.   albuterol (VENTOLIN HFA) 108 (90 Base) MCG/ACT inhaler Inhale 2 puffs into the lungs every 6 (six) hours as needed for wheezing or shortness of breath.   amLODipine-olmesartan (AZOR) 10-40 MG tablet Take 1 tablet by mouth every morning.   buPROPion (WELLBUTRIN XL) 300 MG 24 hr tablet Take 300 mg by mouth daily.   Cholecalciferol (VITAMIN D3) 50 MCG (2000 UT) TABS Take 2,000 Units by mouth daily.   denosumab (PROLIA) 60 MG/ML SOSY injection Inject 60 mg into the skin every 6 (six) months.   ELIQUIS 5 MG TABS  tablet TAKE ONE TABLET (5MG TOTAL) BY MOUTH TWOTIMES DAILY   fluticasone (FLONASE) 50 MCG/ACT nasal spray Place 2 sprays into both nostrils daily.   levothyroxine (SYNTHROID) 50 MCG tablet Take 50 mcg by mouth daily before breakfast.    loratadine (CLARITIN) 10 MG tablet Take 1 tablet (10 mg total) by mouth daily.   metoprolol succinate (TOPROL XL) 25 MG 24 hr tablet Take 1 tablet (25 mg total) by mouth daily.   nitroGLYCERIN (NITROSTAT) 0.4 MG SL tablet Place 0.4 mg under the tongue every 5 (five) minutes as needed for chest pain.   rosuvastatin (CRESTOR) 20 MG tablet Take 20 mg by mouth daily.    sertraline (ZOLOFT) 25 MG tablet Take 25 mg by mouth daily.   TRELEGY ELLIPTA 100-62.5-25 MCG/ACT AEPB Inhale 1 puff into the lungs daily.   vitamin B-12 (CYANOCOBALAMIN) 100 MCG tablet Take 100 mcg by mouth daily.   No current facility-administered medications for this visit. (Other)      REVIEW OF SYSTEMS: ROS   Negative for: Constitutional, Gastrointestinal, Neurological, Skin, Genitourinary, Musculoskeletal, HENT, Endocrine, Cardiovascular, Eyes, Respiratory, Psychiatric, Allergic/Imm, Heme/Lymph Last edited by Silvestre Moment on 09/21/2021  8:22 AM.       ALLERGIES Allergies  Allergen Reactions   Sulfa Antibiotics Hives   Sulfa Antibiotics Hives and Other (See Comments)    Reaction not recalled by the patient, but "hives" were noted  in her other profile in [place] Past Medical History:  Diagnosis Date   A-fib [facility])    Atrial flutter ([place])    On Eliquis in 8/17 but discontinued after stent placement   Breast cancer ([place])    remote   CAD in native artery 03/16/2016   COPD (chronic obstructive pulmonary disease) ([place])    Hypertension    Iron deficiency anemia 06/12/2021   NSTEMI (non-ST elevated myocardial infarction) ([place])    2017   S/P angioplasty with stent 03/15/16 DES Resolute, pLCX 03/16/2016   Vitreous hemorrhage of left eye ([place]) 08/01/2019   Past  Surgical History:  Procedure Laterality Date   APPENDECTOMY     BREAST SURGERY Left    CARDIAC CATHETERIZATION N/A 03/15/2016   Procedure: Left Heart Cath and Coronary Angiography;  Surgeon: Belva Crome, MD;  Location: [place] [facility];  Service: Cardiovascular;  Laterality: N/A;   CARDIAC CATHETERIZATION N/A 03/15/2016   Procedure: Coronary Stent Intervention;  Surgeon: Belva Crome, MD;  Location: [place] [facility];  Service: Cardiovascular;  Laterality: N/A;   COLONOSCOPY     remote   COLONOSCOPY N/A 05/26/2016   Procedure: COLONOSCOPY;  Surgeon: Daneil Dolin, MD;  [facility];  Service: Endoscopy;  Laterality: N/A;  845   ESOPHAGOGASTRODUODENOSCOPY N/A 05/26/2016   Procedure: ESOPHAGOGASTRODUODENOSCOPY (EGD);  Surgeon: Daneil Dolin, MD;  [facility];  Service: Endoscopy;  Laterality: N/A;   MALONEY DILATION N/A 05/26/2016   Procedure: Venia Minks DILATION;  Surgeon: Daneil Dolin, MD;  [facility];  Service: Endoscopy;  Laterality: N/A;   VITRECTOMY Left 10/03/2019   Dr. Zadie Rhine, Vitrectomy, Focal Laser, Removal of Silicone Oil    FAMILY HISTORY Family History  Problem Relation Age of Onset   Heart disease Mother    COPD Father    Cancer Sister        unknown primary   Cancer Brother        unknown primary   Cancer Brother    Hypertension Son    Colon cancer Neg Hx     SOCIAL HISTORY Social History   Tobacco Use   Smoking status: Former    Types: Cigarettes    Quit date: 12/28/2014    Years since quitting: 6.7   Smokeless tobacco: Never   Tobacco comments:    Patient quit within the last 10 years  Vaping Use   Vaping Use: Never used  Substance Use Topics   Alcohol use: Never   Drug use: Never         OPHTHALMIC EXAM:  Base Eye Exam     Visual Acuity (ETDRS)       Right Left   Dist [place] 20/80 -1 20/60 -1   Dist ph [place] 20/60 -1 NI         Tonometry (Tonopen, 8:29 AM)       Right Left   Pressure 9 10          Pupils       Pupils APD   Right PERRL None   Left PERRL None         Visual Fields       Left Right    Full Full         Extraocular Movement       Right Left    Full Full         Neuro/Psych     Oriented x3:  Yes   Mood/Affect: Normal         Dilation     Both eyes: 1.0% Mydriacyl, 2.5% Phenylephrine @ 8:29 AM           Slit Lamp and Fundus Exam     External Exam       Right Left   External Normal Normal         Slit Lamp Exam       Right Left   Lids/Lashes Normal Normal   Conjunctiva/Sclera White and quiet White and quiet   Cornea Clear Clear   Anterior Chamber Deep and quiet Deep and quiet   Iris Round and reactive Round and reactive   Lens Posterior chamber intraocular lens Posterior chamber intraocular lens   Anterior Vitreous Clear Normal         Fundus Exam       Right Left   Posterior Vitreous Clear, avitric Clear, vitrectomized, no oil   Disc Normal Normal   C/D Ratio 0.4 0.25   Macula Flat, no ERM, no topographic distortion Normal   Vessels Normal Normal   Periphery Old dark hemorrhage nasally and temporally, no active hemorrhage peripheral retina, good laser peripherally, a attached everywhere Peripheral chorioretinal scars of laser retinopexy as well as previous sites of hemorrhagic choroidopathy,, no hemorrhage, good laser photocoagulation, retina attached.,  Clear media            IMAGING AND PROCEDURES  Imaging and Procedures for 09/21/21  Color Fundus Photography Optos - OU - Both Eyes       Right Eye Progression has improved. Disc findings include normal observations. Macula : normal observations. Vessels : normal observations. Periphery : hemorrhage.   Left Eye Progression has been stable. Disc findings include normal observations. Macula : normal observations. Vessels : normal observations.   Notes Subretinal and choroidal hemorrhage of PECHR, temporally, now resolving into dark subretinal hemorrhage  and fibrosis, no areas of activity.  Good peripheral PRP will continue to monitor the nasal quadrants.  We will deliver nasal PRP OD today   OS clear media and attached after repair of retinal detachment from [facility] some years previous, nearly 360 resolved post vitrectomy clearance of vitreous hemorrhage silicone oil injection and subsequent peripheral PRP     Panretinal Photocoagulation - OD - Right Eye       Anesthesia Topical anesthesia was used. Anesthetic medications included Proparacaine 0.5%.   Laser Information The type of laser was diode. Color was yellow. The duration in seconds was 0.03. The spot size was 390 microns. Laser power was 340. Total spots was 583.   Post-op The patient tolerated the procedure well. There were no complications. The patient received written and verbal post procedure care education.   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 Right  epiretinal membrane  H35.371 Color Fundus Photography Optos - OU - Both Eyes    Panretinal Photocoagulation - OD - Right Eye    2. Exudative retinopathy of right eye  H35.021     3. Exudative retinopathy of left eye  H35.022     4. 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 Total retinal detachment of left eye  H33.052        1.  2.  3.  Ophthalmic Meds Ordered this visit:  No orders of the defined types were placed in this encounter.      Return in about 6 months (around 03/23/2022) for DILATE OU, COLOR FP, OCT.  There are no Patient Instructions on file for this visit.   Explained the diagnoses, plan, and follow up with the patient and they expressed understanding.  Patient expressed understanding of the importance of proper follow up care.   Clent Demark Alinah Sheard M.D. Diseases & Surgery of the Retina and Vitreous Retina & Diabetic [place] 09/21/21     Abbreviations: M myopia (nearsighted); A astigmatism; H hyperopia (farsighted); P presbyopia; Mrx spectacle prescription;  CTL contact lenses; OD right eye; OS left eye; OU both eyes  XT exotropia; ET esotropia; PEK punctate epithelial keratitis; PEE punctate epithelial erosions; DES dry eye syndrome; MGD meibomian gland dysfunction; ATs artificial tears; PFAT's preservative free artificial tears; [place] nuclear sclerotic cataract; PSC posterior subcapsular cataract; ERM epi-retinal membrane; PVD posterior vitreous detachment; RD retinal detachment; DM diabetes mellitus; DR diabetic retinopathy; NPDR non-proliferative diabetic retinopathy; PDR proliferative diabetic retinopathy; CSME clinically significant macular edema; DME diabetic macular edema; dbh dot blot hemorrhages; CWS cotton wool spot; POAG primary open angle glaucoma; C/D cup-to-disc ratio; HVF humphrey visual field; GVF goldmann visual field; OCT optical coherence tomography; IOP intraocular pressure; BRVO Branch retinal vein occlusion; CRVO central retinal vein occlusion; CRAO central retinal artery occlusion; BRAO branch retinal artery occlusion; RT retinal tear; SB scleral buckle; PPV pars plana vitrectomy; VH Vitreous hemorrhage; PRP panretinal laser photocoagulation; IVK intravitreal kenalog; VMT vitreomacular traction; MH Macular hole;  NVD neovascularization of the disc; NVE  neovascularization elsewhere; AREDS age related eye disease study; ARMD age related macular degeneration; POAG primary open angle glaucoma; EBMD epithelial/anterior basement membrane dystrophy; ACIOL anterior chamber intraocular lens; IOL intraocular lens; PCIOL posterior chamber intraocular lens; Phaco/IOL phacoemulsification with intraocular lens placement; [place] photorefractive keratectomy; LASIK laser assisted in situ keratomileusis; HTN hypertension; DM diabetes mellitus; COPD chronic obstructive pulmonary disease

## 2021-09-21 NOTE — Assessment & Plan Note (Signed)
Resolved post vitrectomy oral repair in the past and oil removal

## 2021-09-21 NOTE — Assessment & Plan Note (Signed)
History of PECHR, today's treatment is for mid peripheral and anterior outer retinal ischemic burden from having multiple hemorrhagic retinal detachments bilaterally in the past  Goal OD is to peripheral PRP to prevent this from a retinopexy standpoint but also decreased peripheral retinal ischemia, outer retina

## 2021-09-21 NOTE — Assessment & Plan Note (Signed)
No new hemorrhagic changes

## 2021-10-01 ENCOUNTER — Encounter: Payer: Self-pay | Admitting: Physician Assistant

## 2021-10-01 ENCOUNTER — Ambulatory Visit (INDEPENDENT_AMBULATORY_CARE_PROVIDER_SITE_OTHER): Payer: Medicare Other | Admitting: Physician Assistant

## 2021-10-01 VITALS — BP 148/62 | HR 65 | Ht 68.0 in | Wt 140.0 lb

## 2021-10-01 DIAGNOSIS — I10 Essential (primary) hypertension: Secondary | ICD-10-CM | POA: Diagnosis not present

## 2021-10-01 DIAGNOSIS — R001 Bradycardia, unspecified: Secondary | ICD-10-CM

## 2021-10-01 DIAGNOSIS — I2510 Atherosclerotic heart disease of native coronary artery without angina pectoris: Secondary | ICD-10-CM | POA: Diagnosis not present

## 2021-10-01 DIAGNOSIS — I214 Non-ST elevation (NSTEMI) myocardial infarction: Secondary | ICD-10-CM

## 2021-10-01 DIAGNOSIS — D696 Thrombocytopenia, unspecified: Secondary | ICD-10-CM | POA: Diagnosis not present

## 2021-10-01 DIAGNOSIS — R768 Other specified abnormal immunological findings in serum: Secondary | ICD-10-CM

## 2021-10-01 DIAGNOSIS — E785 Hyperlipidemia, unspecified: Secondary | ICD-10-CM

## 2021-10-01 DIAGNOSIS — M7062 Trochanteric bursitis, left hip: Secondary | ICD-10-CM | POA: Diagnosis not present

## 2021-10-01 DIAGNOSIS — I4892 Unspecified atrial flutter: Secondary | ICD-10-CM

## 2021-10-01 DIAGNOSIS — I7300 Raynaud's syndrome without gangrene: Secondary | ICD-10-CM | POA: Diagnosis not present

## 2021-10-01 DIAGNOSIS — M810 Age-related osteoporosis without current pathological fracture: Secondary | ICD-10-CM

## 2021-10-01 DIAGNOSIS — N184 Chronic kidney disease, stage 4 (severe): Secondary | ICD-10-CM

## 2021-10-01 DIAGNOSIS — Z8709 Personal history of other diseases of the respiratory system: Secondary | ICD-10-CM | POA: Diagnosis not present

## 2021-10-01 DIAGNOSIS — M62838 Other muscle spasm: Secondary | ICD-10-CM

## 2021-10-01 DIAGNOSIS — M19041 Primary osteoarthritis, right hand: Secondary | ICD-10-CM | POA: Diagnosis not present

## 2021-10-01 DIAGNOSIS — M19042 Primary osteoarthritis, left hand: Secondary | ICD-10-CM

## 2021-10-01 DIAGNOSIS — Z95820 Peripheral vascular angioplasty status with implants and grafts: Secondary | ICD-10-CM

## 2021-10-01 DIAGNOSIS — H353221 Exudative age-related macular degeneration, left eye, with active choroidal neovascularization: Secondary | ICD-10-CM

## 2021-10-01 DIAGNOSIS — R1319 Other dysphagia: Secondary | ICD-10-CM

## 2021-10-01 NOTE — Patient Instructions (Signed)
Raynaud's Phenomenon  Raynaud's phenomenon is a condition that affects the blood vessels (arteries) that carry blood to the fingers and toes. The arteries that supply blood to the ears, lips, nipples, or the tip of the nose might also be affected. Raynaud's phenomenon causes the arteries to become narrow temporarily (spasm). As a result, the flow of blood to the affected areas is temporarily decreased. This usually occurs in response to cold temperatures or stress. During an attack, the skin in the affected areas turns white, then blue, and finally red. A person may also feel tingling or numbness in those areas. Attacks usually last for only a brief period, and then the blood flow to the area returns to normal. In most cases, Raynaud's phenomenon does not cause serious health problems. What are the causes? In many cases, the cause of this condition is not known. The condition may occur on its own (primary Raynaud's phenomenon) or may be associated with other diseases or factors (secondary Raynaud's phenomenon). Possible causes may include: Diseases or medical conditions that damage the arteries. Injuries and repetitive actions that hurt the hands or feet. Being exposed to certain chemicals. Taking medicines that narrow the arteries. Other medical conditions, such as lupus, scleroderma, rheumatoid arthritis, thyroid problems, blood disorders, Sjogren syndrome, or atherosclerosis. What increases the risk? The following factors may make you more likely to develop this condition: Being 25-39 years old. Being female. Having a family history of Raynaud's phenomenon. Living in a cold climate. Smoking. What are the signs or symptoms? Symptoms of this condition usually occur when you are exposed to cold temperatures or when you have emotional stress. The symptoms may last for a few minutes or up to several hours. They usually affect your fingers but may also affect your toes, nipples, lips, ears, or the  tip of your nose. Symptoms may include: Changes in skin color. The skin in the affected areas will turn pale or white. The skin may then change from white to bluish to red as normal blood flow returns to the area. Numbness, tingling, or pain in the affected areas. In severe cases, symptoms may include: Skin sores. Tissues decaying and dying (gangrene). How is this diagnosed? This condition may be diagnosed based on: Your symptoms and medical history. A physical exam. During the exam, you may be asked to put your hands in cold water to check for a reaction to cold temperature. Tests, such as: Blood tests to check for other diseases or conditions. A test to check the movement of blood through your arteries and veins (vascular ultrasound). A test in which the skin at the base of your fingernail is examined under a microscope (nailfold capillaroscopy). How is this treated? During an episode, you can take actions to help symptoms go away faster. Options include moving your arms around in a windmill pattern, warming your fingers under warm water, or placing your fingers in a warm body fold, such as your armpit. Long-term treatment for this condition often involves making lifestyle changes and taking steps to control your exposure to cold temperature. For more severe cases, medicine (calcium channel blockers) may be used to improve blood circulation. Follow these instructions at home: Avoiding cold temperatures Take these steps to avoid exposure to cold: If possible, stay indoors during cold weather. When you go outside during cold weather, dress in layers and wear mittens, a hat, a scarf, and warm footwear. Wear mittens or gloves when handling ice or frozen food. Use holders for glasses or cans containing  cold drinks. Let warm water run for a while before taking a shower or bath. Warm up the car before driving in cold weather. Lifestyle If possible, avoid stressful and emotional situations. Try  to find ways to manage your stress, such as: Exercise. Yoga. Meditation. Biofeedback. Do not use any products that contain nicotine or tobacco. These products include cigarettes, chewing tobacco, and vaping devices, such as e-cigarettes. If you need help quitting, ask your health care provider. Avoid secondhand smoke. Limit your use of caffeine. Switch to decaffeinated coffee, tea, and soda. Avoid chocolate. Avoid vibrating tools and machinery. General instructions Protect your hands and feet from injuries, cuts, or bruises. Avoid wearing tight rings or wristbands. Wear loose fitting socks and comfortable, roomy shoes. Take over-the-counter and prescription medicines only as told by your health care provider. Where to find support Raynaud's Association: www.raynauds.org Where to find more information [employer] of Arthritis and Musculoskeletal and Skin Diseases: www.niams.SouthExposed.es Contact a health care provider if: Your discomfort becomes worse despite lifestyle changes. You develop sores on your fingers or toes that do not heal. You have breaks in the skin on your fingers or toes. You have a fever. You have pain or swelling in your joints. You have a rash. Your symptoms occur on only one side of your body. Get help right away if: Your fingers or toes turn black. You have severe pain in the affected areas. These symptoms may represent a serious problem that is an emergency. Do not wait to see if the symptoms will go away. Get medical help right away. Call your local emergency services (911 in the [place] Do not drive yourself to the hospital. Summary Raynaud's phenomenon is a condition that affects the arteries that carry blood to the fingers, toes, ears, lips, nipples, or the tip of the nose. In many cases, the cause of this condition is not known. Symptoms of this condition include changes in skin color along with numbness and tingling in the affected area. Treatment for  this condition includes lifestyle changes and reducing exposure to cold temperatures. Medicines may be used for severe cases of the condition. Contact your health care provider if your condition worsens despite treatment. This information is not intended to replace advice given to you by your health care provider. Make sure you discuss any questions you have with your health care provider. Document Revised: 05/27/2020 Document Reviewed: 05/27/2020 Elsevier Patient Education  [place].

## 2021-10-02 ENCOUNTER — Ambulatory Visit: Payer: Medicare Other | Admitting: Physician Assistant

## 2021-10-02 NOTE — Progress Notes (Signed)
CBC WNL.  ESR WNL.   UA trace leukocytes.  Negative bacteria and nitrites.  If she develops signs or symptoms of a UTI she should notify her PCP.  Creatinine is elevated 1.63 and GFR is low-32.  Potassium is elevated-5.5.  Please notify the patient. Please clarify if she sees a nephrologist? If not, she should follow up with her PCP to have potassium rechecked. Please also advise the patient to avoid the use of all NSAIDs.

## 2021-10-05 LAB — URINALYSIS, ROUTINE W REFLEX MICROSCOPIC
Bacteria, UA: NONE SEEN /HPF
Bilirubin Urine: NEGATIVE
Glucose, UA: NEGATIVE
Hgb urine dipstick: NEGATIVE
Hyaline Cast: NONE SEEN /LPF
Ketones, ur: NEGATIVE
Nitrite: NEGATIVE
Protein, ur: NEGATIVE
Specific Gravity, Urine: 1.018 (ref 1.001–1.035)
pH: 5.5 (ref 5.0–8.0)

## 2021-10-05 LAB — COMPLETE METABOLIC PANEL WITH GFR
AG Ratio: 1.7 (calc) (ref 1.0–2.5)
ALT: 16 U/L (ref 6–29)
AST: 24 U/L (ref 10–35)
Albumin: 4.1 g/dL (ref 3.6–5.1)
Alkaline phosphatase (APISO): 63 U/L (ref 37–153)
BUN/Creatinine Ratio: 26 (calc) — ABNORMAL HIGH (ref 6–22)
BUN: 43 mg/dL — ABNORMAL HIGH (ref 7–25)
CO2: 21 mmol/L (ref 20–32)
Calcium: 9.8 mg/dL (ref 8.6–10.4)
Chloride: 115 mmol/L — ABNORMAL HIGH (ref 98–110)
Creat: 1.63 mg/dL — ABNORMAL HIGH (ref 0.60–1.00)
Globulin: 2.4 g/dL (calc) (ref 1.9–3.7)
Glucose, Bld: 101 mg/dL — ABNORMAL HIGH (ref 65–99)
Potassium: 5.5 mmol/L — ABNORMAL HIGH (ref 3.5–5.3)
Sodium: 144 mmol/L (ref 135–146)
Total Bilirubin: 0.3 mg/dL (ref 0.2–1.2)
Total Protein: 6.5 g/dL (ref 6.1–8.1)
eGFR: 32 mL/min/{1.73_m2} — ABNORMAL LOW (ref >=60)

## 2021-10-05 LAB — CBC WITH DIFFERENTIAL/PLATELET
Absolute Monocytes: 548 cells/uL (ref 200–950)
Basophils Absolute: 20 cells/uL (ref 0–200)
Basophils Relative: 0.3 %
Eosinophils Absolute: 119 cells/uL (ref 15–500)
Eosinophils Relative: 1.8 %
HCT: 36.6 % (ref 35.0–45.0)
Hemoglobin: 11.9 g/dL (ref 11.7–15.5)
Lymphs Abs: 1023 cells/uL (ref 850–3900)
MCH: 31.3 pg (ref 27.0–33.0)
MCHC: 32.5 g/dL (ref 32.0–36.0)
MCV: 96.3 fL (ref 80.0–100.0)
Monocytes Relative: 8.3 %
Neutro Abs: 4891 cells/uL (ref 1500–7800)
Neutrophils Relative %: 74.1 %
RBC: 3.8 10*6/uL (ref 3.80–5.10)
RDW: 13.7 % (ref 11.0–15.0)
Total Lymphocyte: 15.5 %
WBC: 6.6 10*3/uL (ref 3.8–10.8)

## 2021-10-05 LAB — MICROSCOPIC MESSAGE

## 2021-10-05 LAB — C3 AND C4
C3 Complement: 113 mg/dL (ref 83–193)
C4 Complement: 16 mg/dL (ref 15–57)

## 2021-10-05 LAB — ANTI-NUCLEAR AB-TITER (ANA TITER): ANA Titer 1: >=1:1280 {titer} — AB

## 2021-10-05 LAB — PHOSPHATIDYLSERINE/PROTHROMBIN (PS/PT) ANTIBODIES (IGG, IGM)
Phosphatidylserine/Prothrombin Ab (IgG): <9 U (ref <=30)
Phosphatidylserine/Prothrombin Ab (IgM): >150 U — ABNORMAL HIGH (ref <=30)

## 2021-10-05 LAB — ANTI-SCLERODERMA ANTIBODY: Scleroderma (Scl-70) (ENA) Antibody, IgG: <1 AI

## 2021-10-05 LAB — SEDIMENTATION RATE: Sed Rate: 11 mm/h (ref 0–30)

## 2021-10-05 LAB — ANTI-DNA ANTIBODY, DOUBLE-STRANDED: ds DNA Ab: 2 IU/mL

## 2021-10-05 LAB — ANA: Anti Nuclear Antibody (ANA): POSITIVE — AB

## 2021-10-06 NOTE — Progress Notes (Signed)
ANA remains positive-titer unchanged.  dsDNA is negative. Complements WNL.  Scl-70 negative.    Phosphatidylserine IgM is positive-unchanged.

## 2021-11-05 ENCOUNTER — Telehealth: Payer: Self-pay | Admitting: *Deleted

## 2021-11-05 ENCOUNTER — Telehealth: Payer: Self-pay | Admitting: Cardiology

## 2021-11-05 NOTE — Telephone Encounter (Signed)
Returned call to pt, left another message for pt to call back.

## 2021-11-05 NOTE — Telephone Encounter (Signed)
1st attempt to reach pt regarding surgical clearance and the need for a tele visit, left a message for her to call back and ask for the preop team.

## 2021-11-05 NOTE — Telephone Encounter (Signed)
   Pre-operative Risk Assessment    Patient Name: Patricia Jackson  DOB: 08-02-1940 MRN: [id number]      Request for Surgical Clearance    Procedure:  extraction of 9 teeth with alveoloplasty   Date of Surgery:  Clearance TBD                                 Surgeon:  Diona Browner  Surgeon's Group or Practice Name:  [facility]  Phone number:  [phone] Fax number:  [phone]   Type of Clearance Requested:   - Medical    Type of Anesthesia:  General    Additional requests/questions:    Sandrea Hammond   11/05/2021, 8:45 AM

## 2021-11-05 NOTE — Telephone Encounter (Signed)
   Name: Marlee Armenteros  DOB: Jan 02, 1941  MRN: [id number]  Primary Cardiologist: None  Chart reviewed as part of pre-operative protocol coverage. Because of Larrie Lucia past medical history and time since last visit, she will require a follow-up tele visit in order to better assess preoperative cardiovascular risk.  Pre-op covering staff: - Please schedule appointment and call patient to inform them. If patient already had an upcoming appointment within acceptable timeframe, please add "pre-op clearance" to the appointment notes so provider is aware. - Please contact requesting surgeon's office via preferred method (i.e, phone, fax) to inform them of need for appointment prior to surgery.  This message will also be routed to pharmacy pool  for input on holding Eliquis as requested below so that this information is available to the clearing provider at time of patient's appointment.   Elgie Collard, PA-C  11/05/2021, 8:54 AM

## 2021-11-05 NOTE — Telephone Encounter (Signed)
Pt called back and is agreeable to tele pre op appt 11/11/21 @ 10 am . Med rec and consent are done.   Pt also asked to make her Ph # primary now and not her grandson #. I have updated the chart to reflect the pt's # is primary [phone].     Patient Consent for Virtual Visit        Patricia Jackson has provided verbal consent on 11/05/2021 for a virtual visit (video or telephone).   CONSENT FOR VIRTUAL VISIT FOR:  Patricia Jackson  By participating in this virtual visit I agree to the following:  I hereby voluntarily request, consent and authorize [facility] and its employed or contracted physicians, physician assistants, nurse practitioners or other licensed health care professionals (the Practitioner), to provide me with telemedicine health care services (the "Services") as deemed necessary by the treating Practitioner. I acknowledge and consent to receive the Services by the Practitioner via telemedicine. I understand that the telemedicine visit will involve communicating with the Practitioner through live audiovisual communication technology and the disclosure of certain medical information by electronic transmission. I acknowledge that I have been given the opportunity to request an in-person assessment or other available alternative prior to the telemedicine visit and am voluntarily participating in the telemedicine visit.  I understand that I have the right to withhold or withdraw my consent to the use of telemedicine in the course of my care at any time, without affecting my right to future care or treatment, and that the Practitioner or I may terminate the telemedicine visit at any time. I understand that I have the right to inspect all information obtained and/or recorded in the course of the telemedicine visit and may receive copies of available information for a reasonable fee.  I understand that some of the potential risks of receiving the Services via telemedicine include:  Delay  or interruption in medical evaluation due to technological equipment failure or disruption; Information transmitted may not be sufficient (e.g. poor resolution of images) to allow for appropriate medical decision making by the Practitioner; and/or  In rare instances, security protocols could fail, causing a breach of personal health information.  Furthermore, I acknowledge that it is my responsibility to provide information about my medical history, conditions and care that is complete and accurate to the best of my ability. I acknowledge that Practitioner's advice, recommendations, and/or decision may be based on factors not within their control, such as incomplete or inaccurate data provided by me or distortions of diagnostic images or specimens that may result from electronic transmissions. I understand that the practice of medicine is not an exact science and that Practitioner makes no warranties or guarantees regarding treatment outcomes. I acknowledge that a copy of this consent can be made available to me via my patient portal ([employer]), or I can request a printed copy by calling the office of [facility].    I understand that my insurance will be billed for this visit.   I have read or had this consent read to me. I understand the contents of this consent, which adequately explains the benefits and risks of the Services being provided via telemedicine.  I have been provided ample opportunity to ask questions regarding this consent and the Services and have had my questions answered to my satisfaction. I give my informed consent for the services to be provided through the use of telemedicine in my medical care

## 2021-11-05 NOTE — Telephone Encounter (Signed)
Pt called back and is agreeable to tele pre op appt 11/11/21 @ 10 am . Med rec and consent are done.    Pt also asked to make her Ph # primary now and not her grandson #. I have updated the chart to reflect the pt's # is primary [phone].

## 2021-11-05 NOTE — Telephone Encounter (Signed)
Pt returning a call to schedule preop visit

## 2021-11-08 NOTE — Telephone Encounter (Signed)
Patient with diagnosis of atrial fibrillation on Eliquis for anticoagulation.    Procedure: dental extractions - 9 teeth Date of procedure: TBD   CHA2DS2-VASc Score = 5   This indicates a 7.2% annual risk of stroke. The patient's score is based upon: CHF History: 0 HTN History: 1 Diabetes History: 0 Stroke History: 0 Vascular Disease History: 1 Age Score: 2 Gender Score: 1   CrCl 29 Platelet count 125  Patient does not require pre-op antibiotics for dental procedure.  Per office protocol, patient can hold Eliquis for 3 days prior to procedure.   Patient will not need bridging with Lovenox (enoxaparin) around procedure.  **This guidance is not considered finalized until pre-operative APP has relayed final recommendations.**

## 2021-11-11 ENCOUNTER — Ambulatory Visit (INDEPENDENT_AMBULATORY_CARE_PROVIDER_SITE_OTHER): Payer: Medicare Other | Admitting: Nurse Practitioner

## 2021-11-11 ENCOUNTER — Encounter: Payer: Self-pay | Admitting: Nurse Practitioner

## 2021-11-11 DIAGNOSIS — Z01810 Encounter for preprocedural cardiovascular examination: Secondary | ICD-10-CM | POA: Diagnosis not present

## 2021-11-11 NOTE — Progress Notes (Signed)
Virtual Visit via Telephone Note   Because of Patricia Jackson co-morbid illnesses, she is at least at moderate risk for complications without adequate follow up.  This format is felt to be most appropriate for this patient at this time.  The patient did not have access to video technology/had technical difficulties with video requiring transitioning to audio format only (telephone).  All issues noted in this document were discussed and addressed.  No physical exam could be performed with this format.  Please refer to the patient's chart for her consent to telehealth for [facility].  Evaluation Performed:  Preoperative cardiovascular risk assessment _____________   Date:  11/11/2021   Patient ID:  Patricia Jackson, DOB Oct 20, 1940, MRN [id number] Patient Location:  Home Provider location:   Office  Primary Care Provider:  Celene Squibb, MD Primary Cardiologist:  None  Chief Complaint / Patient Profile   81 y.o. y/o female with a h/o atrial fibrillation/flutter on chronic anticoagulation, CAD s/p DES to proximal circumflex 03/2016 with additional remote stent of circumflex patent at that time , COPD with chronic DOE, HTN who is pending extraction of 9 teeth with alveoloplasty and presents today for telephonic preoperative cardiovascular risk assessment.  Past Medical History    Past Medical History:  Diagnosis Date   A-fib [facility])    Atrial flutter ([place])    On Eliquis in 8/17 but discontinued after stent placement   Breast cancer ([place])    remote   CAD in native artery 03/16/2016   COPD (chronic obstructive pulmonary disease) ([place])    Hypertension    Iron deficiency anemia 06/12/2021   NSTEMI (non-ST elevated myocardial infarction) ([place])    2017   S/P angioplasty with stent 03/15/16 DES Resolute, pLCX 03/16/2016   Vitreous hemorrhage of left eye ([place]) 08/01/2019   Past Surgical History:  Procedure Laterality Date   APPENDECTOMY     BREAST SURGERY Left    CARDIAC  CATHETERIZATION N/A 03/15/2016   Procedure: Left Heart Cath and Coronary Angiography;  Surgeon: Belva Crome, MD;  Location: [facility] CV LAB;  Service: Cardiovascular;  Laterality: N/A;   CARDIAC CATHETERIZATION N/A 03/15/2016   Procedure: Coronary Stent Intervention;  Surgeon: Belva Crome, MD;  Location: [facility] CV [facility];  Service: Cardiovascular;  Laterality: N/A;   COLONOSCOPY     remote   COLONOSCOPY N/A 05/26/2016   Procedure: COLONOSCOPY;  Surgeon: Daneil Dolin, MD;  [facility];  Service: Endoscopy;  Laterality: N/A;  845   ESOPHAGOGASTRODUODENOSCOPY N/A 05/26/2016   Procedure: ESOPHAGOGASTRODUODENOSCOPY (EGD);  Surgeon: Daneil Dolin, MD;  [facility];  Service: Endoscopy;  Laterality: N/A;   MALONEY DILATION N/A 05/26/2016   Procedure: Venia Minks DILATION;  Surgeon: Daneil Dolin, MD;  [facility];  Service: Endoscopy;  Laterality: N/A;   VITRECTOMY Left 10/03/2019   Dr. Zadie Rhine, Vitrectomy, Focal Laser, Removal of Silicone Oil    Allergies  Allergies  Allergen Reactions   Sulfa Antibiotics Hives   Sulfa Antibiotics Hives and Other (See Comments)    Reaction not recalled by the patient, but "hives" were noted in her other profile in Epic    History of Present Illness    Patricia Jackson is a 81 y.o. female who presents via audio/video conferencing for a telehealth visit today.  Pt was last seen in cardiology clinic on 06/17/21 by Dr. Radford Pax.  At that time Patricia Jackson had soft BP and metoprolol succinate was reduced.  The patient is  now pending procedure as outlined above. Since her last visit, she denies chest pain, lower extremity edema, fatigue, palpitations, melena, hematuria, hemoptysis, diaphoresis, weakness, presyncope, syncope, orthopnea, and PND. Feels that her DOE is stable.   Home Medications    Prior to Admission medications   Medication Sig Start Date End Date Taking? Authorizing Provider  acetaminophen (TYLENOL) 500 MG  tablet Take 1,500 mg by mouth every 4 (four) hours as needed for mild pain.    [provider]  albuterol (PROVENTIL) (2.5 MG/3ML) 0.083% nebulizer solution Take 2.5 mg by nebulization every 6 (six) hours as needed for wheezing or shortness of breath.    [provider]  albuterol (VENTOLIN HFA) 108 (90 Base) MCG/ACT inhaler Inhale 2 puffs into the lungs every 6 (six) hours as needed for wheezing or shortness of breath.    [provider]  amLODipine-olmesartan (AZOR) 10-40 MG tablet Take 1 tablet by mouth every morning. 06/23/21   Eugenie Filler, MD  buPROPion (WELLBUTRIN XL) 300 MG 24 hr tablet Take 300 mg by mouth daily. 11/05/19   [provider]  Cholecalciferol (VITAMIN D3) 50 MCG (2000 UT) TABS Take 2,000 Units by mouth daily.    [provider]  denosumab (PROLIA) 60 MG/ML SOSY injection Inject 60 mg into the skin every 6 (six) months.    [provider]  ELIQUIS 5 MG TABS tablet TAKE ONE TABLET (5MG TOTAL) BY MOUTH TWOTIMES DAILY 06/30/21   Sueanne Margarita, MD  fluticasone (FLONASE) 50 MCG/ACT nasal spray Place 2 sprays into both nostrils daily. 06/23/21   Eugenie Filler, MD  levothyroxine (SYNTHROID) 50 MCG tablet Take 50 mcg by mouth daily before breakfast.  09/20/19   [provider]  loratadine (CLARITIN) 10 MG tablet Take 1 tablet (10 mg total) by mouth daily. 06/23/21   Eugenie Filler, MD  metoprolol succinate (TOPROL XL) 25 MG 24 hr tablet Take 1 tablet (25 mg total) by mouth daily. 09/02/21   Sueanne Margarita, MD  nitroGLYCERIN (NITROSTAT) 0.4 MG SL tablet Place 0.4 mg under the tongue every 5 (five) minutes as needed for chest pain. 06/09/19   [provider]  rosuvastatin (CRESTOR) 20 MG tablet Take 20 mg by mouth daily.  02/26/17   [provider]  sertraline (ZOLOFT) 25 MG tablet Take 25 mg by mouth daily.    [provider]  TRELEGY ELLIPTA 100-62.5-25 MCG/ACT AEPB Inhale 1 puff into the  lungs daily. 06/13/21   [provider]  vitamin B-12 (CYANOCOBALAMIN) 100 MCG tablet Take 100 mcg by mouth daily.    [provider]    Physical Exam    Vital Signs:  Patricia Jackson does not have vital signs available for review today.  Given telephonic nature of communication, physical exam is limited. AAOx3. NAD. Normal affect.  Speech and respirations are unlabored.  Accessory Clinical Findings    None  Assessment & Plan    1.  Preoperative Cardiovascular Risk Assessment: The patient is doing well from a cardiac perspective. Therefore, based on ACC/AHA guidelines, the patient would be at acceptable risk for the planned procedure without further cardiovascular testing. The patient was advised that if he develops new symptoms prior to surgery to contact our office to arrange for a follow-up visit, and he verbalized understanding. According to the Revised Cardiac Risk Index (RCRI), her Perioperative Risk of Major Cardiac Event is (%): 0.4.  Her Functional Capacity in METs is: 4.4 according to the Duke Activity Status  Index (DASI).  Patient does not require pre-op antibiotics for dental procedure. Per office protocol, patient can hold Eliquis for 3 days prior to procedure.   Patient will not need bridging with Lovenox (enoxaparin) around procedure.  A copy of this note will be routed to requesting surgeon.  Time:   Today, I have spent 9 minutes with the patient with telehealth technology discussing medical history, symptoms, and management plan.     Emmaline Life, NP-C    11/11/2021, 10:06 AM [place] [address], [address] Office [phone] Fax [phone]

## 2021-12-03 DIAGNOSIS — E782 Mixed hyperlipidemia: Secondary | ICD-10-CM | POA: Diagnosis not present

## 2021-12-03 DIAGNOSIS — I10 Essential (primary) hypertension: Secondary | ICD-10-CM | POA: Diagnosis not present

## 2021-12-18 DIAGNOSIS — Z0001 Encounter for general adult medical examination with abnormal findings: Secondary | ICD-10-CM | POA: Diagnosis not present

## 2021-12-24 ENCOUNTER — Encounter (HOSPITAL_COMMUNITY): Payer: Medicare Other

## 2021-12-25 ENCOUNTER — Inpatient Hospital Stay: Payer: Medicare Other

## 2021-12-31 ENCOUNTER — Encounter (HOSPITAL_COMMUNITY): Admission: RE | Admit: 2021-12-31 | Payer: Medicare Other | Source: Ambulatory Visit

## 2021-12-31 ENCOUNTER — Ambulatory Visit: Payer: Medicare Other | Admitting: Cardiology

## 2021-12-31 ENCOUNTER — Inpatient Hospital Stay: Payer: Medicare Other | Attending: Physician Assistant

## 2022-01-01 ENCOUNTER — Inpatient Hospital Stay: Payer: Medicare Other | Admitting: Physician Assistant

## 2022-01-13 NOTE — H&P (Signed)
  Patient: Patricia Jackson  PID: [id number]  DOB: 1940-11-22  SEX: Female   Patient referred by  DDS for extraction 9 teeth. Plan upper CD and lower RPD.  CC: Painful tooth upper left.  Past Medical History:  Hypertension, NSTEMI, Bronchitis, Thyroid Disease, Cancer, Depression, COPD, Osteoporosis, Osteoarthritis, A fib, A flutter    Medications: Albuterol, Acetaminophen, Wellbutrin, Prolia, Eliquis, Ferrous sulfate, Trelegy Ellipta, Synthroid, Toprol-XL, Nitroglycerin, Crestor, Zoloft, Amlodipine, benzonatate, Fluvoxamine maleate,  Denosumab (injection q 4 month. Next dose 11/18/2021)   Allergies:     Sulfa    Surgeries:   Eye surgery, Oral Surgery sedation, Angioplasty with DES 03/15/2016  Social History       Smoking: n           Alcohol:n Drug use:n                             Exam: BMI 21. Multiple carious teeth # 2, 6, 8, 9, 11(retained root), 14; Gingival recession with root exposure23, 24, 25 with mobility and calculus.  Pharynx clear. No lymphadenopathy.  Panorex:carious teeth # 2, 6, 8, 9, Residual root #11, Decay #14. Perio disease 23, 24, 26.   Assessment: ASA 3. Non-restorable 2, 6, 8, 9, 11, 14, 23, 24, 26. Discussed MRONJ and possible need to skip next dose of 11/18/2021.               Plan: 1. Cardiac clearance. 2. Med clearance re D/C Prolia dose 11/18/2021.   3. D/C Eliquis  prior to surgery  24 Extraction Teeth #2, 6, 8, 9, 11, 14, 23, 24, 26 with alveoloplasty.    Hospital Day surgery.                 Rx: n               Risks and complications explained. Questions answered.   Gae Bon, DMD

## 2022-01-14 ENCOUNTER — Encounter (HOSPITAL_COMMUNITY): Payer: Self-pay | Admitting: Oral Surgery

## 2022-01-14 ENCOUNTER — Other Ambulatory Visit: Payer: Self-pay

## 2022-01-14 NOTE — Progress Notes (Signed)
PCP - Allyn Kenner, MD Cardiologist - Carlyle Dolly, MD  PPM/ICD - denies  Chest x-ray - 08/23/21 EKG - 08/24/21 ECHO - 06/19/21 Cardiac Cath - 03/15/16  CPAP - n/a  Fasting Blood Sugar - n/a  Blood Thinner Instructions: Eliquis - last day 01/12/22 per patient Aspirin Instructions: Patient was instructed: As of today, STOP taking any Aspirin (unless otherwise instructed by your surgeon) Aleve, Naproxen, Ibuprofen, Motrin, Advil, Goody's, BC's, all herbal medications, fish oil, and all vitamins.  ERAS Protcol - n/a  COVID TEST- n/a  Anesthesia review: yes - cardiac history; cardiac clearance on 11/11/21  Patient verbally denies any shortness of breath, fever, cough and chest pain during phone call   -------------  SDW INSTRUCTIONS given:  Your procedure is scheduled on Friday, October 13th, 2023.  Report to [facility] Main Entrance "A" at 0530 A.M., and check in at the Admitting office.  Call this number if you have problems the morning of surgery:  [phone]   Remember:  Do not eat or drink after midnight the night before your surgery    Take these medicines the morning of surgery with A SIP OF WATER Amoxicillin, Bupropion, Synthroid, Metoprolol, Crestor, Trelegy PRN: Nitroglycerin, Tylenol, Hydrocodone,    The day of surgery:                     Do not wear jewelry, make up, or nail polish            Do not wear lotions, powders, perfumes, or deodorant.            Do not shave 48 hours prior to surgery.              Do not bring valuables to the hospital.            [facility] is not responsible for any belongings or valuables.  Do NOT Smoke (Tobacco/Vaping) 24 hours prior to your procedure If you use a CPAP at night, you may bring all equipment for your overnight stay.   Contacts, glasses, dentures or bridgework may not be worn into surgery.      For patients admitted to the hospital, discharge time will be determined by your treatment team.   Patients  discharged the day of surgery will not be allowed to drive home, and someone needs to stay with them for 24 hours.    Special instructions:   [facility]- Preparing For Surgery  Before surgery, you can play an important role. Because skin is not sterile, your skin needs to be as free of germs as possible. You can reduce the number of germs on your skin by washing with CHG (chlorahexidine gluconate) Soap before surgery.  CHG is an antiseptic cleaner which kills germs and bonds with the skin to continue killing germs even after washing.    Oral Hygiene is also important to reduce your risk of infection.  Remember - BRUSH YOUR TEETH THE MORNING OF SURGERY WITH YOUR REGULAR TOOTHPASTE  Please do not use if you have an allergy to CHG or antibacterial soaps. If your skin becomes reddened/irritated stop using the CHG.  Do not shave (including legs and underarms) for at least 48 hours prior to first CHG shower. It is OK to shave your face.  Please follow these instructions carefully.   Shower the NIGHT BEFORE SURGERY and the MORNING OF SURGERY with DIAL Soap.   Pat yourself dry with a CLEAN TOWEL.  Wear CLEAN PAJAMAS to bed  the night before surgery  Place CLEAN SHEETS on your bed the night of your first shower and DO NOT SLEEP WITH PETS.   Day of Surgery: Please shower morning of surgery  Wear Clean/Comfortable clothing the morning of surgery Do not apply any deodorants/lotions.   Remember to brush your teeth WITH YOUR REGULAR TOOTHPASTE.   Questions were answered. Patient verbalized understanding of instructions.

## 2022-01-14 NOTE — Progress Notes (Signed)
Anesthesia Chart Review: Same day workup  Follows cardiology for history of atrial fibrillation/flutter on chronic anticoagulation, CAD s/p DES to proximal circumflex 03/2016 with additional remote stent to circumflex patent at that time, HTN.  Seen by Christen Bame, NP 11/11/2021 for preop evaluation.  Per note, " Preoperative Cardiovascular Risk Assessment: The patient is doing well from a cardiac perspective. Therefore, based on ACC/AHA guidelines, the patient would be at acceptable risk for the planned procedure without further cardiovascular testing. The patient was advised that if he develops new symptoms prior to surgery to contact our office to arrange for a follow-up visit, and he verbalized understanding. According to the Revised Cardiac Risk Index (RCRI), her Perioperative Risk of Major Cardiac Event is (%): 0.4.  Her Functional Capacity in METs is: 4.4 according to the Duke Activity Status Index (DASI). Patient does not require pre-op antibiotics for dental procedure. Per office protocol, patient can hold Eliquis for 3 days prior to procedure. Patient will not need bridging with Lovenox (enoxaparin) around procedure."  Patient reports last dose Eliquis 01/13/2019.  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  Anemia improved significantly after iron and copper repletion.  Former smoker with associated COPD.  Maintained on Trelegy Ellipta.  EKG 08/23/2021: Sinus bradycardia.  Rate 56.  Nonspecific T abnormality, anterolateral leads.  Baseline wander in lead V2.  CHEST - 2 VIEW 08/23/2021:   COMPARISON:  None Available.   FINDINGS: The lungs are symmetrically, mildly hyperinflated in keeping with changes of underlying COPD. The lungs are clear. No pneumothorax or pleural effusion. Cardiac size within normal limits. Pulmonary vascularity is normal. No acute bone abnormality. Osseous  structures are age-appropriate.   IMPRESSION: No active cardiopulmonary disease.  COPD.   TTE 06/19/2021:  1. Left ventricular ejection fraction, by estimation, is 60 to 65%. The  left ventricle has normal function. The left ventricle has no regional  wall motion abnormalities. There is mild left ventricular hypertrophy.  Left ventricular diastolic parameters  are indeterminate. Elevated left atrial pressure.   2. Right ventricular systolic function is normal. The right ventricular  size is normal. There is mildly elevated pulmonary artery systolic  pressure.   3. The mitral valve is abnormal. Mild mitral valve regurgitation.  Moderate mitral stenosis. The mean mitral valve gradient is 6.0 mmHg.   4. The tricuspid valve is abnormal.   5. The aortic valve is tricuspid. There is mild calcification of the  aortic valve. There is mild thickening of the aortic valve. Aortic valve  regurgitation is not visualized. No aortic stenosis is present.   6. The inferior vena cava is normal in size with greater than 50%  respiratory variability, suggesting right atrial pressure of 3 mmHg.   Carotid duplex 06/13/2017: Final Interpretation:  Right Carotid: Velocities in the right ICA are consistent with a 1-39% stenosis. No significant change in category of stenosis in the ICA when compared to previous exam.  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 Non-hemodynamically significant plaque noted in the CCA. No significant change in category of stenosis in the ICA when compared to previous exam.  Vertebrals:  Both vertebral arteries were patent with antegrade flow.  Subclavians: Normal flow hemodynamics were seen in bilateral subclavian arteries.   Cath and PCI 03/15/2016: The left ventricular ejection fraction is 55-65% by visual estimate. The left ventricular systolic function is normal. A STENT RESOLUTE ONYX  8.29F62 drug eluting stent was successfully placed, and  does not overlap previously placed stent. Prox Cx to Mid Cx lesion, 99 %stenosed. Post intervention, there is a 0% residual stenosis.   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 Otherwise widely patent coronary arteries Successful angioplasty and stenting of the proximal circumflex reducing a greater than 95% stenosis to 0% with TIMI grade 3 flow. Resolute Onyx 2.75 x 12 mm stent deployed at 14 atm.   Recommendations:   Aspirin, Brilinta, and Eliquis x 1 month then drop aspirin. See further instruction below for 3 months and beyond. Eliquis should start in AM as long as no recurrent AF/Afl. Would also consider switch to Plavix at 3 months to decrease the risk of bleeding on Eliquis.   Wynonia Musty [facility] Short Stay Center/Anesthesiology Phone [phone] 01/14/2022 12:07 PM

## 2022-01-14 NOTE — Anesthesia Preprocedure Evaluation (Addendum)
Anesthesia Evaluation  Patient identified by MRN, date of birth, ID band Patient awake    Reviewed: Allergy & Precautions, H&P , NPO status , Patient's Chart, lab work & pertinent test results  Airway Mallampati: II   Neck ROM: full    Dental   Pulmonary COPD, former smoker,    breath sounds clear to auscultation       Cardiovascular hypertension, + CAD, + Past MI and + Cardiac Stents  + dysrhythmias Atrial Fibrillation  Rhythm:regular Rate:Normal     Neuro/Psych PSYCHIATRIC DISORDERS Depression    GI/Hepatic   Endo/Other  Hypothyroidism   Renal/GU      Musculoskeletal   Abdominal   Peds  Hematology   Anesthesia Other Findings   Reproductive/Obstetrics                            Anesthesia Physical Anesthesia Plan  ASA: 3  Anesthesia Plan: General   Post-op Pain Management:    Induction: Intravenous  PONV Risk Score and Plan: 3 and Ondansetron, Dexamethasone and Treatment may vary due to age or medical condition  Airway Management Planned: Nasal ETT  Additional Equipment:   Intra-op Plan:   Post-operative Plan: Extubation in OR  Informed Consent: I have reviewed the patients History and Physical, chart, labs and discussed the procedure including the risks, benefits and alternatives for the proposed anesthesia with the patient or authorized representative who has indicated his/her understanding and acceptance.     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  Seen by Christen Bame, NP 11/11/2021 for preop evaluation.  Per note, "Preoperative Cardiovascular Risk Assessment: The patient  is doing well from a cardiac perspective. Therefore, based on ACC/AHA guidelines, the patient would be at acceptable risk for the planned procedure without further cardiovascular testing. The patient was advised that if hedevelops new symptoms prior to surgery to contact our office to arrange for a follow-up visit, and heverbalized understanding.According to the Revised Cardiac Risk Index (RCRI),herPerioperative Risk of Major Cardiac Event is (%): 0.4. HerFunctional Capacity in METs is: 4.4according to the Duke Activity Status Index (DASI). Patient does not require pre-op antibiotics for dental procedure. Per office protocol, patient can holdEliquisfor 3days prior to procedure. Patient will not need bridging with Lovenox (enoxaparin) around procedure."  Patient reports last dose Eliquis 01/13/2019.  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  Anemia improved significantly after iron and copper repletion.  Former smoker with associated COPD.  Maintained on Trelegy Ellipta.  EKG 08/23/2021: Sinus bradycardia.  Rate 56.  Nonspecific T abnormality, anterolateral leads.  Baseline wander in lead V2.  CHEST - 2 VIEW 08/23/2021:  COMPARISON: None Available.  FINDINGS: The lungs are symmetrically, mildly hyperinflated in keeping with changes of underlying COPD. The lungs are clear. No pneumothorax or pleural effusion. Cardiac size within normal limits. Pulmonary vascularity is normal. No acute bone abnormality. Osseous structures are age-appropriate.  IMPRESSION: No active cardiopulmonary disease. COPD.  TTE 06/19/2021: 1. Left ventricular ejection fraction, by estimation, is 60 to 65%. The  left ventricle has normal function. The left ventricle has no regional  wall motion abnormalities. There is mild  left ventricular hypertrophy.  Left ventricular diastolic parameters  are  indeterminate. Elevated left atrial pressure.  2. Right ventricular systolic function is normal. The right ventricular  size is normal. There is mildly elevated pulmonary artery systolic  pressure.  3. The mitral valve is abnormal. Mild mitral valve regurgitation.  Moderate mitral stenosis. The mean mitral valve gradient is 6.0 mmHg.  4. The tricuspid valve is abnormal.  5. The aortic valve is tricuspid. There is mild calcification of the  aortic valve. There is mild thickening of the aortic valve. Aortic valve  regurgitation is not visualized. No aortic stenosis is present.  6. The inferior vena cava is normal in size with greater than 50%  respiratory variability, suggesting right atrial pressure of 3 mmHg.   Carotid duplex 06/13/2017: Final Interpretation:  Right Carotid: Velocities in the right ICA are consistent with a 1-39% stenosis. No significant change in category of stenosis in the ICA when compared to previous exam.  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 Non-hemodynamically significant plaque noted in the CCA. No significant change in category of stenosis in the ICA when compared to previous exam.  Vertebrals: Both vertebral arteries were patent with antegrade flow.  Subclavians: Normal flow hemodynamics were seen in bilateral subclavian arteries.   Cath and PCI 03/15/2016: . The left ventricular ejection fraction is 55-65% by visual estimate. . The left ventricular systolic function is normal. . A STENT RESOLUTE ONYX 2.75X12 drug eluting stent was successfully placed, and does not overlap previously placed stent. . Prox Cx to Mid Cx lesion, 99 %stenosed. Marland Kitchen Post intervention, there is a 0% residual stenosis.  . 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 . Otherwise widely patent coronary arteries . Successful angioplasty and stenting of the proximal circumflex reducing a greater than 95%  stenosis to 0% with TIMI grade 3 flow. Resolute Onyx 2.75 x 12 mm stent deployed at 14 atm.  Recommendations:  . Aspirin, Brilinta, and Eliquis x 1 month then drop aspirin. See further instruction below for 3 months and beyond. . Eliquis should start in AM as long as no recurrent AF/Afl. . Would also consider switch to Plavix at 3 months to decrease the risk of bleeding on Eliquis.  )       Anesthesia Quick Evaluation

## 2022-01-15 ENCOUNTER — Other Ambulatory Visit: Payer: Self-pay

## 2022-01-15 ENCOUNTER — Ambulatory Visit (HOSPITAL_BASED_OUTPATIENT_CLINIC_OR_DEPARTMENT_OTHER): Payer: Medicare Other | Admitting: Physician Assistant

## 2022-01-15 ENCOUNTER — Encounter (HOSPITAL_COMMUNITY)
Admission: RE | Disposition: A | Discharge status: Home / Self Care | Payer: Self-pay | Source: Home / Self Care | Attending: Oral Surgery

## 2022-01-15 ENCOUNTER — Encounter (HOSPITAL_COMMUNITY): Payer: Self-pay | Admitting: Oral Surgery

## 2022-01-15 ENCOUNTER — Ambulatory Visit (HOSPITAL_COMMUNITY)
Admission: RE | Admit: 2022-01-15 | Discharge: 2022-01-15 | Disposition: A | Discharge status: Home / Self Care | Payer: Medicare Other | Attending: Oral Surgery | Admitting: Oral Surgery

## 2022-01-15 ENCOUNTER — Ambulatory Visit (HOSPITAL_COMMUNITY): Payer: Medicare Other | Admitting: Physician Assistant

## 2022-01-15 DIAGNOSIS — I129 Hypertensive chronic kidney disease with stage 1 through stage 4 chronic kidney disease, or unspecified chronic kidney disease: Secondary | ICD-10-CM | POA: Insufficient documentation

## 2022-01-15 DIAGNOSIS — K029 Dental caries, unspecified: Secondary | ICD-10-CM

## 2022-01-15 DIAGNOSIS — I252 Old myocardial infarction: Secondary | ICD-10-CM | POA: Diagnosis not present

## 2022-01-15 DIAGNOSIS — K0889 Other specified disorders of teeth and supporting structures: Secondary | ICD-10-CM | POA: Diagnosis not present

## 2022-01-15 DIAGNOSIS — J449 Chronic obstructive pulmonary disease, unspecified: Secondary | ICD-10-CM | POA: Insufficient documentation

## 2022-01-15 DIAGNOSIS — K056 Periodontal disease, unspecified: Secondary | ICD-10-CM

## 2022-01-15 DIAGNOSIS — N183 Chronic kidney disease, stage 3 unspecified: Secondary | ICD-10-CM | POA: Insufficient documentation

## 2022-01-15 DIAGNOSIS — Z87891 Personal history of nicotine dependence: Secondary | ICD-10-CM | POA: Diagnosis not present

## 2022-01-15 DIAGNOSIS — Z7901 Long term (current) use of anticoagulants: Secondary | ICD-10-CM | POA: Insufficient documentation

## 2022-01-15 DIAGNOSIS — I2510 Atherosclerotic heart disease of native coronary artery without angina pectoris: Secondary | ICD-10-CM | POA: Insufficient documentation

## 2022-01-15 DIAGNOSIS — Z955 Presence of coronary angioplasty implant and graft: Secondary | ICD-10-CM | POA: Insufficient documentation

## 2022-01-15 DIAGNOSIS — E039 Hypothyroidism, unspecified: Secondary | ICD-10-CM | POA: Diagnosis not present

## 2022-01-15 DIAGNOSIS — I4891 Unspecified atrial fibrillation: Secondary | ICD-10-CM | POA: Diagnosis not present

## 2022-01-15 HISTORY — DX: Cardiac arrhythmia, unspecified: I49.9

## 2022-01-15 HISTORY — PX: TOOTH EXTRACTION: SHX859

## 2022-01-15 LAB — CBC
HCT: 38.8 % (ref 36.0–46.0)
Hemoglobin: 12 g/dL (ref 12.0–15.0)
MCH: 30.5 pg (ref 26.0–34.0)
MCHC: 30.9 g/dL (ref 30.0–36.0)
MCV: 98.7 fL (ref 80.0–100.0)
Platelets: 100 10*3/uL — ABNORMAL LOW (ref 150–400)
RBC: 3.93 MIL/uL (ref 3.87–5.11)
RDW: 13.4 % (ref 11.5–15.5)
WBC: 7 10*3/uL (ref 4.0–10.5)
nRBC: 0 % (ref 0.0–0.2)

## 2022-01-15 LAB — BASIC METABOLIC PANEL
Anion gap: 6 (ref 5–15)
BUN: 35 mg/dL — ABNORMAL HIGH (ref 8–23)
CO2: 19 mmol/L — ABNORMAL LOW (ref 22–32)
Calcium: 9.6 mg/dL (ref 8.9–10.3)
Chloride: 117 mmol/L — ABNORMAL HIGH (ref 98–111)
Creatinine, Ser: 1.56 mg/dL — ABNORMAL HIGH (ref 0.44–1.00)
GFR, Estimated: 34 mL/min — ABNORMAL LOW (ref >60)
Glucose, Bld: 94 mg/dL (ref 70–99)
Potassium: 5.4 mmol/L — ABNORMAL HIGH (ref 3.5–5.1)
Sodium: 142 mmol/L (ref 135–145)

## 2022-01-15 SURGERY — DENTAL RESTORATION/EXTRACTIONS
Anesthesia: General

## 2022-01-15 MED ORDER — EPHEDRINE 5 MG/ML INJ
INTRAVENOUS | Status: AC
  Filled 2022-01-15: qty 5

## 2022-01-15 MED ORDER — CHLORHEXIDINE GLUCONATE 0.12 % MT SOLN
15.0000 mL | Freq: Once | OROMUCOSAL | Status: AC
  Administered 2022-01-15: 15 mL via OROMUCOSAL
  Filled 2022-01-15: qty 15

## 2022-01-15 MED ORDER — LIDOCAINE-EPINEPHRINE 2 %-1:100000 IJ SOLN
INTRAMUSCULAR | Status: DC | PRN
  Administered 2022-01-15: 20 mL

## 2022-01-15 MED ORDER — LIDOCAINE 2% (20 MG/ML) 5 ML SYRINGE
INTRAMUSCULAR | Status: AC
  Filled 2022-01-15: qty 5

## 2022-01-15 MED ORDER — SODIUM CHLORIDE 0.9 % IR SOLN
Status: DC | PRN
  Administered 2022-01-15: 1000 mL

## 2022-01-15 MED ORDER — LACTATED RINGERS IV SOLN: INTRAVENOUS | Status: DC

## 2022-01-15 MED ORDER — ONDANSETRON HCL 4 MG/2ML IJ SOLN
INTRAMUSCULAR | Status: AC
  Filled 2022-01-15: qty 2

## 2022-01-15 MED ORDER — CEFAZOLIN SODIUM-DEXTROSE 2-4 GM/100ML-% IV SOLN
2.0000 g | INTRAVENOUS | Status: AC
  Administered 2022-01-15: 2 g via INTRAVENOUS
  Filled 2022-01-15: qty 100

## 2022-01-15 MED ORDER — OXYCODONE HCL 5 MG PO TABS: 5.0000 mg | ORAL_TABLET | Freq: Once | ORAL | Status: DC | PRN

## 2022-01-15 MED ORDER — FENTANYL CITRATE (PF) 250 MCG/5ML IJ SOLN
INTRAMUSCULAR | Status: AC
  Filled 2022-01-15: qty 5

## 2022-01-15 MED ORDER — PHENYLEPHRINE 80 MCG/ML (10ML) SYRINGE FOR IV PUSH (FOR BLOOD PRESSURE SUPPORT)
PREFILLED_SYRINGE | INTRAVENOUS | Status: AC
  Filled 2022-01-15: qty 10

## 2022-01-15 MED ORDER — FENTANYL CITRATE (PF) 250 MCG/5ML IJ SOLN
INTRAMUSCULAR | Status: DC | PRN
  Administered 2022-01-15: 100 ug via INTRAVENOUS

## 2022-01-15 MED ORDER — PROPOFOL 10 MG/ML IV BOLUS
INTRAVENOUS | Status: AC
  Filled 2022-01-15: qty 20

## 2022-01-15 MED ORDER — LIDOCAINE 2% (20 MG/ML) 5 ML SYRINGE
INTRAMUSCULAR | Status: DC | PRN
  Administered 2022-01-15: 60 mg via INTRAVENOUS

## 2022-01-15 MED ORDER — AMOXICILLIN 500 MG PO CAPS: 500.0000 mg | ORAL_CAPSULE | Freq: Two times a day (BID) | ORAL | 0 refills | Status: DC

## 2022-01-15 MED ORDER — PROPOFOL 10 MG/ML IV BOLUS
INTRAVENOUS | Status: DC | PRN
  Administered 2022-01-15: 110 mg via INTRAVENOUS

## 2022-01-15 MED ORDER — HYDROCODONE-ACETAMINOPHEN 5-325 MG PO TABS: 1.0000 | ORAL_TABLET | ORAL | 0 refills | Status: AC | PRN

## 2022-01-15 MED ORDER — ROCURONIUM BROMIDE 10 MG/ML (PF) SYRINGE
PREFILLED_SYRINGE | INTRAVENOUS | Status: DC | PRN
  Administered 2022-01-15: 50 mg via INTRAVENOUS

## 2022-01-15 MED ORDER — SUGAMMADEX SODIUM 200 MG/2ML IV SOLN
INTRAVENOUS | Status: DC | PRN
  Administered 2022-01-15: 200 mg via INTRAVENOUS

## 2022-01-15 MED ORDER — DEXAMETHASONE SODIUM PHOSPHATE 10 MG/ML IJ SOLN
INTRAMUSCULAR | Status: AC
  Filled 2022-01-15: qty 1

## 2022-01-15 MED ORDER — OXYMETAZOLINE HCL 0.05 % NA SOLN
NASAL | Status: AC
  Filled 2022-01-15: qty 30

## 2022-01-15 MED ORDER — ROCURONIUM BROMIDE 10 MG/ML (PF) SYRINGE
PREFILLED_SYRINGE | INTRAVENOUS | Status: AC
  Filled 2022-01-15: qty 10

## 2022-01-15 MED ORDER — PHENYLEPHRINE HCL (PRESSORS) 10 MG/ML IV SOLN
INTRAVENOUS | Status: DC | PRN
  Administered 2022-01-15 (×3): 80 ug via INTRAVENOUS

## 2022-01-15 MED ORDER — ONDANSETRON HCL 4 MG/2ML IJ SOLN
INTRAMUSCULAR | Status: DC | PRN
  Administered 2022-01-15: 4 mg via INTRAVENOUS

## 2022-01-15 MED ORDER — ORAL CARE MOUTH RINSE: 15.0000 mL | Freq: Once | OROMUCOSAL | Status: AC

## 2022-01-15 MED ORDER — FENTANYL CITRATE (PF) 100 MCG/2ML IJ SOLN: 25.0000 ug | INTRAMUSCULAR | Status: DC | PRN

## 2022-01-15 MED ORDER — OXYMETAZOLINE HCL 0.05 % NA SOLN
NASAL | Status: DC | PRN
  Administered 2022-01-15: 2 via NASAL

## 2022-01-15 MED ORDER — 0.9 % SODIUM CHLORIDE (POUR BTL) OPTIME
TOPICAL | Status: DC | PRN
  Administered 2022-01-15: 1000 mL

## 2022-01-15 MED ORDER — EPHEDRINE SULFATE-NACL 50-0.9 MG/10ML-% IV SOSY
PREFILLED_SYRINGE | INTRAVENOUS | Status: DC | PRN
  Administered 2022-01-15: 10 mg via INTRAVENOUS
  Administered 2022-01-15 (×3): 5 mg via INTRAVENOUS

## 2022-01-15 MED ORDER — ONDANSETRON HCL 4 MG/2ML IJ SOLN: 4.0000 mg | Freq: Four times a day (QID) | INTRAMUSCULAR | Status: DC | PRN

## 2022-01-15 MED ORDER — LIDOCAINE-EPINEPHRINE 2 %-1:100000 IJ SOLN
INTRAMUSCULAR | Status: AC
  Filled 2022-01-15: qty 1

## 2022-01-15 MED ORDER — OXYCODONE HCL 5 MG/5ML PO SOLN: 5.0000 mg | Freq: Once | ORAL | Status: DC | PRN

## 2022-01-15 SURGICAL SUPPLY — 36 items
BAG COUNTER SPONGE SURGICOUNT (BAG) IMPLANT
BLADE SURG 15 STRL LF DISP TIS (BLADE) ×1 IMPLANT
BLADE SURG 15 STRL SS (BLADE) ×1
BUR CROSS CUT FISSURE 1.6 (BURR) ×1 IMPLANT
BUR EGG ELITE 4.0 (BURR) ×1 IMPLANT
CANISTER SUCT 3000ML PPV (MISCELLANEOUS) ×1 IMPLANT
COVER SURGICAL LIGHT HANDLE (MISCELLANEOUS) ×1 IMPLANT
DRAPE U-SHAPE 76X120 STRL (DRAPES) ×1 IMPLANT
GAUZE PACKING FOLDED 2  STR (GAUZE/BANDAGES/DRESSINGS) ×1
GAUZE PACKING FOLDED 2 STR (GAUZE/BANDAGES/DRESSINGS) ×1 IMPLANT
GLOVE BIO SURGEON STRL SZ 6.5 (GLOVE) IMPLANT
GLOVE BIO SURGEON STRL SZ7 (GLOVE) IMPLANT
GLOVE BIO SURGEON STRL SZ8 (GLOVE) ×1 IMPLANT
GLOVE BIOGEL PI IND STRL 6.5 (GLOVE) IMPLANT
GLOVE BIOGEL PI IND STRL 7.0 (GLOVE) IMPLANT
GOWN STRL REUS W/ TWL LRG LVL3 (GOWN DISPOSABLE) ×1 IMPLANT
GOWN STRL REUS W/ TWL XL LVL3 (GOWN DISPOSABLE) ×1 IMPLANT
GOWN STRL REUS W/TWL LRG LVL3 (GOWN DISPOSABLE) ×1
GOWN STRL REUS W/TWL XL LVL3 (GOWN DISPOSABLE) ×1
IV NS 1000ML (IV SOLUTION) ×1
IV NS 1000ML BAXH (IV SOLUTION) ×1 IMPLANT
KIT BASIN OR (CUSTOM PROCEDURE TRAY) ×1 IMPLANT
KIT TURNOVER KIT B (KITS) ×1 IMPLANT
NDL HYPO 25GX1X1/2 BEV (NEEDLE) ×2 IMPLANT
NEEDLE HYPO 25GX1X1/2 BEV (NEEDLE) ×2 IMPLANT
NS IRRIG 1000ML POUR BTL (IV SOLUTION) ×1 IMPLANT
PAD ARMBOARD 7.5X6 YLW CONV (MISCELLANEOUS) ×1 IMPLANT
SLEEVE IRRIGATION ELITE 7 (MISCELLANEOUS) ×1 IMPLANT
SPIKE FLUID TRANSFER (MISCELLANEOUS) ×1 IMPLANT
SPONGE SURGIFOAM ABS GEL 12-7 (HEMOSTASIS) IMPLANT
SUT CHROMIC 3 0 PS 2 (SUTURE) ×1 IMPLANT
SYR BULB IRRIG 60ML STRL (SYRINGE) ×1 IMPLANT
SYR CONTROL 10ML LL (SYRINGE) ×1 IMPLANT
TRAY ENT MC OR (CUSTOM PROCEDURE TRAY) ×1 IMPLANT
TUBING IRRIGATION (MISCELLANEOUS) ×1 IMPLANT
YANKAUER SUCT BULB TIP NO VENT (SUCTIONS) ×1 IMPLANT

## 2022-01-15 NOTE — H&P (Signed)
H&P documentation  -History and Physical Reviewed  -Patient has been re-examined  -No change in the plan of care  Waylan Busta  

## 2022-01-15 NOTE — Anesthesia Procedure Notes (Signed)
Procedure Name: Intubation Date/Time: 01/15/2022 7:33 AM  Performed by: Maude Leriche, CRNAPre-anesthesia Checklist: Patient identified, Emergency Drugs available, Suction available and Patient being monitored Patient Re-evaluated:Patient Re-evaluated prior to induction Oxygen Delivery Method: Circle system utilized Preoxygenation: Pre-oxygenation with 100% oxygen Induction Type: IV induction Ventilation: Mask ventilation without difficulty Laryngoscope Size: Miller and 2 Grade View: Grade II Nasal Tubes: Nasal prep performed Tube size: 7.0 mm Number of attempts: 1 Placement Confirmation: ETT inserted through vocal cords under direct vision, positive ETCO2 and breath sounds checked- equal and bilateral Secured at: 21 cm Tube secured with: Tape Dental Injury: Teeth and Oropharynx as per pre-operative assessment  Comments: Nasal ETT on national recall; utilized oral ETT

## 2022-01-15 NOTE — Transfer of Care (Signed)
Immediate Anesthesia Transfer of Care Note  Patient: Patricia Jackson  Procedure(s) Performed: DENTAL RESTORATION/EXTRACTIONS  Patient Location: PACU  Anesthesia Type:General  Level of Consciousness: awake, alert  and oriented  Airway & Oxygen Therapy: Patient Spontanous Breathing and Patient connected to nasal cannula oxygen  Post-op Assessment: Report given to RN, Post -op Vital signs reviewed and stable, Patient moving all extremities X 4 and Patient able to stick tongue midline  Post vital signs: Reviewed  Last Vitals:  Vitals Value Taken Time  BP 120/55 01/15/22 0820  Temp 97.0   Pulse 76 01/15/22 0823  Resp 17 01/15/22 0823  SpO2 100 % 01/15/22 0823  Vitals shown include unvalidated device data.  Last Pain:  Vitals:   01/15/22 0607  TempSrc:   PainSc: 5       Patients Stated Pain Goal: 0 (83/07/35 4301)  Complications: No notable events documented.

## 2022-01-15 NOTE — Anesthesia Postprocedure Evaluation (Signed)
Anesthesia Post Note  Patient: Patricia Jackson  Procedure(s) Performed: DENTAL RESTORATION/EXTRACTIONS     Patient location during evaluation: PACU Anesthesia Type: General Level of consciousness: awake and alert Pain management: pain level controlled Vital Signs Assessment: post-procedure vital signs reviewed and stable Respiratory status: spontaneous breathing, nonlabored ventilation, respiratory function stable and patient connected to nasal cannula oxygen Cardiovascular status: blood pressure returned to baseline and stable Postop Assessment: no apparent nausea or vomiting Anesthetic complications: no   No notable events documented.  Last Vitals:  Vitals:   01/15/22 0830 01/15/22 0845  BP: (!) 116/56 101/85  Pulse: 72 70  Resp: 20 11  Temp:  36.4 C  SpO2: 99% 96%    Last Pain:  Vitals:   01/15/22 0845  TempSrc:   PainSc: 0-No pain                 [name] S

## 2022-01-15 NOTE — Op Note (Signed)
01/15/2022  8:14 AM  PATIENT:  Patricia Jackson  81 y.o. female  PRE-OPERATIVE DIAGNOSIS:  NON-RESTORABLE TEETH # 2, 6, 8, 9, 11, 14, 23, 24, 26 SECONDARY TO DENTAL CARIES AND PERIODONTAL DISEASE  POST-OPERATIVE DIAGNOSIS:  SAME + TOOTH # 11 NOT PRESENT  PROCEDURE:  Procedure(s): DENTAL EXTRACTIONS TEETH # 2, 6, 8, 9,  14, 23, 24, 26; ALVEOLOPLASTY RIGHT AND LEFT MAXILLA  SURGEON:  Surgeon(s): Diona Browner, DMD  ANESTHESIA:   local and general  EBL:  minimal  DRAINS: none   SPECIMEN:  No Specimen  COUNTS:  YES  PLAN OF CARE: Discharge to home after PACU  PATIENT DISPOSITION:  PACU - hemodynamically stable.   PROCEDURE DETAILS: Dictation # [id number]  Gae Bon, DMD 01/15/2022 8:14 AM

## 2022-01-15 NOTE — Op Note (Signed)
Patricia Jackson, Patricia Jackson MEDICAL RECORD NO: [id number] ACCOUNT NO: [id number] DATE OF BIRTH: 05/08/1940 FACILITY: [facility] LOCATION: [facility] PHYSICIAN: Gae Bon, DDS  Operative Report   DATE OF PROCEDURE: 01/15/2022  PREOPERATIVE DIAGNOSIS:  Nonrestorable teeth numbers 2, 6, 8, 9, 11, 14, 23, 24, 26 secondary to dental caries and periodontal disease.  POSTOPERATIVE DIAGNOSES:  Nonrestorable teeth numbers 2, 6, 8, 9, 11, 14, 23, 24, 26 secondary to dental caries and periodontal disease.  Tooth #11 not present.  PROCEDURE:  Extraction of teeth #2, 6, 8, 9, 14, 23, 24, 26.  Alveoloplasty right and left maxilla.  SURGEON:  Gae Bon, DDS  ANESTHESIA:  General oral intubation, Dr. Marcie Bal attending.  DESCRIPTION OF PROCEDURE:  The patient was taken to the operating room and placed on the table in supine position.  General anesthesia was administered.  An oral endotracheal tube was placed and secured.  The eyes were protected.  The patient was draped  for surgery.  A timeout was performed.  The posterior pharynx was suctioned and a throat pack was placed.  2% lidocaine in 1:100,000 epinephrine was infiltrated in an inferior alveolar block on the right and left sides with buccal anterior mandibular  infiltration and in buccal and palatal infiltration in the maxilla, total of 20 mL was utilized.  A bite block was placed on the right side of the mouth, the left side was operated first.  A 15 blade was used to make an incision beginning at tooth #14  carried forward along the alveolar crest to tooth numbers 8 and 9 and then in the edentulous space to tooth #6.  The periosteum was reflected.  Teeth numbers 14, 8, 9 and 6 were elevated and removed with dental forceps.  Tooth #11 appeared on x-ray as an  embedded tooth root. The periosteum was reflected to expose the alveolar crest, which was irregular in contour and had small exostosis present.  Then, the egg bur was used to smooth these  irregular bone prominences until the arch was smooth. Bone was  removed overlying tooth #11 and no root was identified.  It was presumed that the tooth had ankylosed and been extracted previously and the bone was really sclerotic in the extraction socket. The bone file was then used to further smooth the area and  then the left maxilla was closed primarily with 3-0 chromic.  Then, teeth numbers 23, 24, and 26 were removed using a periosteal elevator to elevate the periosteum.  The teeth were easily removed with the Ash forceps and then the socket was curetted,  irrigated and closed with 3-0 chromic.  The throat pack was removed.  The endotracheal tube was repositioned and resecured to the left side of the mouth.  A new throat pack was placed and then tooth #2 was removed using a 15 blade to make an incision  around tooth #2 and then joined along the edentulous alveolar ridge to tooth #6 area.  The periosteum was reflected.  The tooth was elevated and removed with the dental forceps.  Then, alveoplasty was performed in the right maxilla using the egg bur  followed by the bone file.  Then, the area was irrigated and closed with 3-0 chromic.  Then, the oral cavity was irrigated and suctioned.  The throat pack was removed.  The patient was left under care of anesthesia for extubation and transport to  recovery room with plans for discharge home through day surgery.  ESTIMATED BLOOD LOSS:  Minimal.  COMPLICATIONS:  None.  SPECIMENS:  None.   VAI D: 01/15/2022 8:19:55 am T: 01/15/2022 8:35:00 am  JOB: [id number]/ [id number]

## 2022-01-16 ENCOUNTER — Encounter (HOSPITAL_COMMUNITY): Payer: Self-pay | Admitting: Oral Surgery

## 2022-01-18 ENCOUNTER — Ambulatory Visit (HOSPITAL_COMMUNITY): Admission: RE | Admit: 2022-01-18 | Payer: Medicare Other | Source: Ambulatory Visit | Admitting: Oral Surgery

## 2022-01-18 ENCOUNTER — Encounter (HOSPITAL_COMMUNITY): Admission: RE | Payer: Self-pay | Source: Ambulatory Visit

## 2022-01-18 SURGERY — DENTAL RESTORATION/EXTRACTIONS
Anesthesia: General

## 2022-01-19 ENCOUNTER — Telehealth: Payer: Self-pay | Admitting: Pharmacy Technician

## 2022-01-19 ENCOUNTER — Other Ambulatory Visit: Payer: Self-pay

## 2022-01-19 DIAGNOSIS — M810 Age-related osteoporosis without current pathological fracture: Secondary | ICD-10-CM | POA: Insufficient documentation

## 2022-01-19 NOTE — Telephone Encounter (Signed)
Auth Submission: APPROVED Payer: [employer] MEDICARE Medication & CPT/J Code(s) submitted: Prolia (Denosumab) [id number] Route of submission (phone, fax, portal): PORTAL Phone # Fax # Auth type: Buy/Bill Units/visits requested: X2 DOSES Reference number: [id number] Approval from: 01/19/22 to 01/20/23

## 2022-01-21 ENCOUNTER — Encounter (HOSPITAL_COMMUNITY): Payer: Medicare Other

## 2022-02-09 ENCOUNTER — Ambulatory Visit (HOSPITAL_COMMUNITY): Admission: RE | Admit: 2022-02-09 | Payer: Medicare Other | Source: Ambulatory Visit

## 2022-02-23 DIAGNOSIS — H4311 Vitreous hemorrhage, right eye: Secondary | ICD-10-CM | POA: Diagnosis not present

## 2022-02-23 DIAGNOSIS — H35371 Puckering of macula, right eye: Secondary | ICD-10-CM | POA: Diagnosis not present

## 2022-02-23 DIAGNOSIS — H35021 Exudative retinopathy, right eye: Secondary | ICD-10-CM | POA: Diagnosis not present

## 2022-02-23 DIAGNOSIS — H353124 Nonexudative age-related macular degeneration, left eye, advanced atrophic with subfoveal involvement: Secondary | ICD-10-CM | POA: Diagnosis not present

## 2022-02-23 DIAGNOSIS — H3561 Retinal hemorrhage, right eye: Secondary | ICD-10-CM | POA: Diagnosis not present

## 2022-02-23 DIAGNOSIS — H353211 Exudative age-related macular degeneration, right eye, with active choroidal neovascularization: Secondary | ICD-10-CM | POA: Diagnosis not present

## 2022-03-01 ENCOUNTER — Other Ambulatory Visit: Payer: Self-pay | Admitting: Cardiology

## 2022-03-01 NOTE — Telephone Encounter (Signed)
Prescription refill request for Eliquis received. Indication: AF Last office visit: 06/17/21  Ashok Norris MD Scr: 1.56 on 01/15/22 Age: 81 Weight: 61.7kg  Based on above findings Eliquis 8m twice daily is the appropriate dose.  Refill approved.

## 2022-03-02 ENCOUNTER — Ambulatory Visit: Payer: Medicare Other | Admitting: Student

## 2022-03-02 ENCOUNTER — Other Ambulatory Visit: Payer: Self-pay

## 2022-03-02 ENCOUNTER — Encounter: Payer: Self-pay | Admitting: Emergency Medicine

## 2022-03-02 ENCOUNTER — Ambulatory Visit
Admission: EM | Admit: 2022-03-02 | Discharge: 2022-03-02 | Disposition: A | Discharge status: Home / Self Care | Payer: Medicare Other | Attending: Family Medicine | Admitting: Family Medicine

## 2022-03-02 ENCOUNTER — Ambulatory Visit (INDEPENDENT_AMBULATORY_CARE_PROVIDER_SITE_OTHER): Payer: Medicare Other

## 2022-03-02 DIAGNOSIS — Z79899 Other long term (current) drug therapy: Secondary | ICD-10-CM | POA: Insufficient documentation

## 2022-03-02 DIAGNOSIS — R0602 Shortness of breath: Secondary | ICD-10-CM | POA: Diagnosis not present

## 2022-03-02 DIAGNOSIS — Z7951 Long term (current) use of inhaled steroids: Secondary | ICD-10-CM | POA: Diagnosis not present

## 2022-03-02 DIAGNOSIS — U071 COVID-19: Secondary | ICD-10-CM | POA: Diagnosis not present

## 2022-03-02 DIAGNOSIS — J441 Chronic obstructive pulmonary disease with (acute) exacerbation: Secondary | ICD-10-CM | POA: Diagnosis not present

## 2022-03-02 DIAGNOSIS — R059 Cough, unspecified: Secondary | ICD-10-CM | POA: Diagnosis not present

## 2022-03-02 DIAGNOSIS — J069 Acute upper respiratory infection, unspecified: Secondary | ICD-10-CM

## 2022-03-02 LAB — RESP PANEL BY RT-PCR (FLU A&B, COVID) ARPGX2
Influenza A by PCR: NEGATIVE
Influenza B by PCR: NEGATIVE
SARS Coronavirus 2 by RT PCR: POSITIVE — AB

## 2022-03-02 MED ORDER — IPRATROPIUM-ALBUTEROL 0.5-2.5 (3) MG/3ML IN SOLN: 3.0000 mL | RESPIRATORY_TRACT | 0 refills | Status: DC | PRN

## 2022-03-02 MED ORDER — GUAIFENESIN ER 600 MG PO TB12: 600.0000 mg | ORAL_TABLET | Freq: Two times a day (BID) | ORAL | 0 refills | Status: DC | PRN

## 2022-03-02 MED ORDER — PREDNISONE 20 MG PO TABS: 40.0000 mg | ORAL_TABLET | Freq: Every day | ORAL | 0 refills | Status: DC

## 2022-03-02 MED ORDER — PROMETHAZINE-DM 6.25-15 MG/5ML PO SYRP: 5.0000 mL | ORAL_SOLUTION | Freq: Four times a day (QID) | ORAL | 0 refills | Status: DC | PRN

## 2022-03-02 MED ORDER — ALBUTEROL SULFATE HFA 108 (90 BASE) MCG/ACT IN AERS: 1.0000 | INHALATION_SPRAY | Freq: Four times a day (QID) | RESPIRATORY_TRACT | 0 refills | Status: DC | PRN

## 2022-03-02 MED ORDER — IPRATROPIUM-ALBUTEROL 0.5-2.5 (3) MG/3ML IN SOLN
3.0000 mL | Freq: Once | RESPIRATORY_TRACT | Status: AC
  Administered 2022-03-02: 3 mL via RESPIRATORY_TRACT

## 2022-03-02 MED ORDER — AZITHROMYCIN 250 MG PO TABS: ORAL_TABLET | ORAL | 0 refills | Status: DC

## 2022-03-02 NOTE — ED Provider Notes (Signed)
[facility]    CSN: [id number] Arrival date & time: 03/02/22  1323      History   Chief Complaint Chief Complaint  Patient presents with   Cough    HPI Patricia Jackson is a 81 y.o. female.   Patient presenting today with 4-day history of progressively worsening cough, congestion, wheezing, shortness of breath on exertion.  Denies fever, chills, body aches, chest pain, abdominal pain, nausea vomiting or diarrhea.  History of COPD on Trelegy and albuterol nebulizers but states her nebulizer machine was not working this morning so her last treatment was overnight.  Not trying anything over-the-counter for symptoms.    Past Medical History:  Diagnosis Date   A-fib [facility])    Atrial flutter ([place])    On Eliquis in 8/17 but discontinued after stent placement   Breast cancer ([place])    remote   CAD in native artery 03/16/2016   COPD (chronic obstructive pulmonary disease) ([place])    Dysrhythmia    Hypertension    Iron deficiency anemia 06/12/2021   NSTEMI (non-ST elevated myocardial infarction) ([place])    2017   S/P angioplasty with stent 03/15/16 DES Resolute, pLCX 03/16/2016   Vitreous hemorrhage of left eye ([place]) 08/01/2019    Patient Active Problem List   Diagnosis Date Noted   Osteoporosis 01/19/2022   DOE (dyspnea on exertion)    Acidosis, unspecified 06/20/2021   Elevated troponin 06/19/2021   Hypothyroidism 06/19/2021   Iron deficiency anemia 06/12/2021   Chronic kidney disease, 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   Retinal hemorrhage, right eye 11/04/2020   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   Traction detachment of left retina 08/03/2019   Left retinal detachment 08/01/2019   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/01/2019   Retinal hemorrhage of left eye 08/01/2019   Advanced nonexudative age-related macular degeneration of left eye with subfoveal  involvement 08/01/2019   Total retinal detachment of left eye 07/26/2019   Choroidal detachment of right eye 07/26/2019   Exudative retinopathy of left eye 07/26/2019   Thrombocytopenia ([place]) 07/04/2019   Orthostatic syncope 08/27/2017   Atypical chest pain 08/27/2017   Atrial flutter ([place]) 10/28/2016   CKD (chronic kidney disease), 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   COPD (chronic obstructive pulmonary disease) ([place]) 10/28/2016   Depression 10/28/2016   Esophageal dysphagia    Anemia 04/07/2016   Sinus bradycardia    CAD in native artery 03/16/2016   S/P angioplasty with stent 03/15/16 DES Resolute, [place] 03/16/2016   NSTEMI (non-ST elevated myocardial infarction) ([place]) 03/12/2016   Dyspnea on exertion    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[place]) 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   Hyperlipidemia 10/31/2015   Atrial flutter with rapid ventricular response ([place]) 10/30/2015   Back pain 11/26/2013   Arm pain 11/26/2013   Chest pain 11/26/2013   Hypertension     Past Surgical History:  Procedure Laterality Date   APPENDECTOMY     BREAST SURGERY Left    CARDIAC CATHETERIZATION N/A 03/15/2016   Procedure: Left Heart Cath and Coronary Angiography;  Surgeon: Belva Crome, MD;  Location: [facility];  Service: Cardiovascular;  Laterality: N/A;   CARDIAC CATHETERIZATION N/A 03/15/2016   Procedure: Coronary Stent Intervention;  Surgeon: Belva Crome, MD;  Location: [facility];  Service: Cardiovascular;  Laterality: N/A;   COLONOSCOPY     remote   COLONOSCOPY N/A 05/26/2016   Procedure: COLONOSCOPY;  Surgeon: Daneil Dolin, MD;  Location: [facility];  Service: Endoscopy;  Laterality: N/A;  845   ESOPHAGOGASTRODUODENOSCOPY N/A 05/26/2016   Procedure: ESOPHAGOGASTRODUODENOSCOPY (EGD);  Surgeon: Daneil Dolin, MD;  Location: [facility];  Service: Endoscopy;  Laterality: N/A;   EYE SURGERY     MALONEY DILATION N/A 05/26/2016   Procedure: Venia Minks DILATION;  Surgeon: Daneil Dolin, MD;  Location: [facility];   Service: Endoscopy;  Laterality: N/A;   TOOTH EXTRACTION N/A 01/15/2022   Procedure: DENTAL RESTORATION/EXTRACTIONS;  Surgeon: Diona Browner, DMD;  Location: [facility];  Service: Oral Surgery;  Laterality: N/A;   VITRECTOMY Left 10/03/2019   Dr. Zadie Rhine, Vitrectomy, Focal Laser, Removal of Silicone Oil    OB History   No obstetric history on file.      Home Medications    Prior to Admission medications   Medication Sig Start Date End Date Taking? Authorizing Provider  albuterol (VENTOLIN HFA) 108 (90 Base) MCG/ACT inhaler Inhale 1-2 puffs into the lungs every 6 (six) hours as needed for wheezing or shortness of breath. 03/02/22  Yes Volney American, PA-C  azithromycin (ZITHROMAX) 250 MG tablet Take first 2 tablets together, then 1 every day until finished. 03/02/22  Yes Volney American, PA-C  guaiFENesin (MUCINEX) 600 MG 12 hr tablet Take 1 tablet (600 mg total) by mouth 2 (two) times daily as needed. 03/02/22  Yes Volney American, PA-C  ipratropium-albuterol (DUONEB) 0.5-2.5 (3) MG/3ML SOLN Take 3 mLs by nebulization every 4 (four) hours as needed. 03/02/22  Yes Volney American, PA-C  predniSONE (DELTASONE) 20 MG tablet Take 2 tablets (40 mg total) by mouth daily with breakfast. 03/02/22  Yes Volney American, PA-C  promethazine-dextromethorphan (PROMETHAZINE-DM) 6.25-15 MG/5ML syrup Take 5 mLs by mouth 4 (four) times daily as needed. 03/02/22  Yes Volney American, PA-C  acetaminophen (TYLENOL) 500 MG tablet Take 1,000 mg by mouth every 4 (four) hours as needed for mild pain.    [provider]  albuterol (PROVENTIL) (2.5 MG/3ML) 0.083% nebulizer solution Take 2.5 mg by nebulization every 6 (six) hours as needed for wheezing or shortness of breath.    [provider]  amLODipine-olmesartan (AZOR) 10-40 MG tablet Take 1 tablet by mouth every morning. 06/23/21   Eugenie Filler, MD  amoxicillin (AMOXIL) 500 MG capsule Take 500 mg by  mouth 2 (two) times daily. 01/11/22   [provider]  amoxicillin (AMOXIL) 500 MG capsule Take 1 capsule (500 mg total) by mouth 2 (two) times daily. 01/15/22   Diona Browner, DMD  apixaban (ELIQUIS) 5 MG TABS tablet TAKE ONE TABLET (5MG TOTAL) BY MOUTH TWOTIMES DAILY 03/01/22   Sueanne Margarita, MD  buPROPion (WELLBUTRIN XL) 300 MG 24 hr tablet Take 300 mg by mouth daily. 11/05/19   [provider]  Cholecalciferol (VITAMIN D3) 50 MCG (2000 UT) TABS Take 2,000 Units by mouth daily.    [provider]  Copper Gluconate (COPPER CAPS PO) Take 6 mg by mouth daily.    [provider]  cyanocobalamin (VITAMIN B12) 1000 MCG tablet Take 1,000 mcg by mouth daily.    [provider]  denosumab (PROLIA) 60 MG/ML SOSY injection Inject 60 mg into the skin every 6 (six) months.    [provider]  fluticasone (FLONASE) 50 MCG/ACT nasal spray Place 2 sprays into both nostrils daily. Patient not taking: Reported on 01/13/2022 06/23/21   Eugenie Filler, MD  levothyroxine (SYNTHROID) 50 MCG tablet Take 50 mcg by mouth  daily before breakfast.  09/20/19   [provider]  loratadine (CLARITIN) 10 MG tablet Take 1 tablet (10 mg total) by mouth daily. Patient not taking: Reported on 01/13/2022 06/23/21   Eugenie Filler, MD  metoprolol succinate (TOPROL XL) 25 MG 24 hr tablet Take 1 tablet (25 mg total) by mouth daily. 09/02/21   Sueanne Margarita, MD  nitroGLYCERIN (NITROSTAT) 0.4 MG SL tablet Place 0.4 mg under the tongue every 5 (five) minutes as needed for chest pain. 06/09/19   [provider]  rosuvastatin (CRESTOR) 20 MG tablet Take 20 mg by mouth daily.  02/26/17   [provider]  sertraline (ZOLOFT) 25 MG tablet Take 25 mg by mouth at bedtime.    [provider]  TRELEGY ELLIPTA 100-62.5-25 MCG/ACT AEPB Inhale 1 puff into the lungs daily. 06/13/21   [provider]    Family History Family History  Problem  Relation Age of Onset   Heart disease Mother    COPD Father    Cancer Sister        unknown primary   Cancer Brother        unknown primary   Cancer Brother    Hypertension Son    Colon cancer Neg Hx     Social History Social History   Tobacco Use   Smoking status: Former    Types: Cigarettes    Quit date: 12/28/2014    Years since quitting: 7.1   Smokeless tobacco: Never   Tobacco comments:    Patient quit within the last 10 years  Vaping Use   Vaping Use: Never used  Substance Use Topics   Alcohol use: Never   Drug use: Never     Allergies   Sulfa antibiotics   Review of Systems Review of Systems Per HPI  Physical Exam Triage Vital Signs ED Triage Vitals  Enc Vitals Group     BP 03/02/22 1424 98/65     Pulse Rate 03/02/22 1424 77     Resp 03/02/22 1424 20     Temp 03/02/22 1424 (!) 97.4 F (36.3 C)     Temp Source 03/02/22 1424 Oral     SpO2 03/02/22 1424 95 %     Weight --      Height --      Head Circumference --      Peak Flow --      Pain Score 03/02/22 1422 6     Pain Loc --      Pain Edu? --      Excl. in [place]? --    No data found.  Updated Vital Signs BP 98/65 (BP Location: Right Arm)   Pulse 77   Temp (!) 97.4 F (36.3 C) (Oral)   Resp 20   SpO2 95% Comment: oxygen ranges from 92-95% during triage while coughing.  Visual Acuity Right Eye Distance:   Left Eye Distance:   Bilateral Distance:    Right Eye Near:   Left Eye Near:    Bilateral Near:     Physical Exam Vitals and nursing note reviewed.  Constitutional:      Appearance: Normal appearance.  HENT:     Head: Atraumatic.     Right Ear: Tympanic membrane and external ear normal.     Left Ear: Tympanic membrane and external ear normal.     Nose: Rhinorrhea present.     Mouth/Throat:     Mouth: Mucous membranes are moist.     Pharynx: Posterior  oropharyngeal erythema present.  Eyes:     Extraocular Movements: Extraocular movements intact.     Conjunctiva/sclera:  Conjunctivae normal.  Cardiovascular:     Rate and Rhythm: Normal rate and regular rhythm.     Heart sounds: Normal heart sounds.  Pulmonary:     Effort: Pulmonary effort is normal.     Breath sounds: Wheezing present.     Comments: Decreased breath sounds throughout Musculoskeletal:        General: Normal range of motion.     Cervical back: Normal range of motion and neck supple.  Skin:    General: Skin is warm and dry.  Neurological:     Mental Status: She is alert and oriented to person, place, and time.     Motor: No weakness.     Gait: Gait normal.  Psychiatric:        Mood and Affect: Mood normal.        Thought Content: Thought content normal.    UC Treatments / Results  Labs (all labs ordered are listed, but only abnormal results are displayed) Labs Reviewed  RESP PANEL BY RT-PCR (FLU A&B, COVID) [id number]   EKG  Radiology DG Chest 2 View  Result Date: 03/02/2022 CLINICAL DATA:  Worsening cough, short of breath EXAM: CHEST - 2 VIEW COMPARISON:  None Available. FINDINGS: Normal mediastinum and cardiac silhouette. Lungs are hyperinflated. Normal pulmonary vasculature. No evidence of effusion, infiltrate, or pneumothorax. No acute bony abnormality. IMPRESSION: Hyperinflated lungs.  No acute findings. Electronically Signed   By: Suzy Bouchard M.D.   On: 03/02/2022 15:01    Procedures Procedures (including critical care time)  Medications Ordered in UC Medications  ipratropium-albuterol (DUONEB) 0.5-2.5 (3) MG/3ML nebulizer solution 3 mL (3 mLs Nebulization Given 03/02/22 1454)    Initial Impression / Assessment and Plan / UC Course  I have reviewed the triage vital signs and the nursing notes.  Pertinent labs & imaging results that were available during my care of the patient were reviewed by me and considered in my medical decision making (see chart for details).     Vital signs reassuring today, she is overall well-appearing and in no respiratory distress.   Her chest x-ray is without acute cardiopulmonary abnormalities today.  She is feeling slightly better after a DuoNeb treatment.  Respiratory panel pending, good candidate for molnupiravir or Tamiflu if positive for either 1.  For now we will treat for COPD exacerbation with prednisone, azithromycin, Phenergan DM, albuterol inhaler and refill DuoNeb solution when she gets her machine working again.  Return for any worsening symptoms.  Final Clinical Impressions(s) / UC Diagnoses   Final diagnoses:  Viral URI  COPD exacerbation [facility])   Discharge Instructions   None    ED Prescriptions     Medication Sig Dispense Auth. Provider   predniSONE (DELTASONE) 20 MG tablet Take 2 tablets (40 mg total) by mouth daily with breakfast. 10 tablet Volney American, PA-C   azithromycin (ZITHROMAX) 250 MG tablet Take first 2 tablets together, then 1 every day until finished. 6 tablet Volney American, [place]   ipratropium-albuterol (DUONEB) 0.5-2.5 (3) MG/3ML SOLN Take 3 mLs by nebulization every 4 (four) hours as needed. 360 mL Volney American, PA-C   albuterol (VENTOLIN HFA) 108 (90 Base) MCG/ACT inhaler Inhale 1-2 puffs into the lungs every 6 (six) hours as needed for wheezing or shortness of breath. 18 g [place], [place]   promethazine-dextromethorphan (PROMETHAZINE-DM) 6.25-15 MG/5ML syrup Take 5 mLs  by mouth 4 (four) times daily as needed. 100 mL Volney American, PA-C   guaiFENesin (MUCINEX) 600 MG 12 hr tablet Take 1 tablet (600 mg total) by mouth 2 (two) times daily as needed. 20 tablet Volney American, [place]      PDMP not reviewed this encounter.   Volney American, [place] 03/02/22 1636

## 2022-03-02 NOTE — ED Triage Notes (Signed)
Pt repots cough, shortness of breath, runny nose since Friday. Pt reports has been using home neb with no change in symptoms. Moderate dyspnea noted in triage.

## 2022-03-03 ENCOUNTER — Telehealth (HOSPITAL_COMMUNITY): Payer: Self-pay | Admitting: Emergency Medicine

## 2022-03-03 MED ORDER — MOLNUPIRAVIR EUA 200MG CAPSULE: 4.0000 | ORAL_CAPSULE | Freq: Two times a day (BID) | ORAL | 0 refills | Status: AC

## 2022-03-23 ENCOUNTER — Encounter (INDEPENDENT_AMBULATORY_CARE_PROVIDER_SITE_OTHER): Payer: Medicare Other | Admitting: Ophthalmology

## 2022-04-15 ENCOUNTER — Emergency Department (HOSPITAL_COMMUNITY)
Admission: EM | Admit: 2022-04-15 | Discharge: 2022-04-16 | Disposition: A | Discharge status: Home / Self Care | Payer: 59 | Attending: Emergency Medicine | Admitting: Emergency Medicine

## 2022-04-15 ENCOUNTER — Encounter (HOSPITAL_COMMUNITY): Payer: Self-pay | Admitting: Internal Medicine

## 2022-04-15 ENCOUNTER — Other Ambulatory Visit: Payer: Self-pay

## 2022-04-15 ENCOUNTER — Encounter (HOSPITAL_COMMUNITY): Payer: Self-pay | Admitting: Hematology

## 2022-04-15 ENCOUNTER — Encounter (HOSPITAL_COMMUNITY): Payer: Self-pay

## 2022-04-15 DIAGNOSIS — I10 Essential (primary) hypertension: Secondary | ICD-10-CM | POA: Diagnosis not present

## 2022-04-15 DIAGNOSIS — R079 Chest pain, unspecified: Secondary | ICD-10-CM | POA: Diagnosis not present

## 2022-04-15 DIAGNOSIS — M79602 Pain in left arm: Secondary | ICD-10-CM | POA: Diagnosis not present

## 2022-04-15 DIAGNOSIS — I2510 Atherosclerotic heart disease of native coronary artery without angina pectoris: Secondary | ICD-10-CM | POA: Diagnosis not present

## 2022-04-15 DIAGNOSIS — Z7901 Long term (current) use of anticoagulants: Secondary | ICD-10-CM | POA: Insufficient documentation

## 2022-04-15 DIAGNOSIS — M79601 Pain in right arm: Secondary | ICD-10-CM

## 2022-04-15 DIAGNOSIS — I700 Atherosclerosis of aorta: Secondary | ICD-10-CM | POA: Diagnosis not present

## 2022-04-15 NOTE — ED Triage Notes (Signed)
Pt presents with bilateral arm pain that starts in back and goes down both arms. Pt had stent placement around 2017 and pt now takes nitro when has this pain. Pt took 2 nitro tonight and it did not relieve any pain. Denies any chest pain.

## 2022-04-16 ENCOUNTER — Emergency Department (HOSPITAL_COMMUNITY): Payer: 59

## 2022-04-16 DIAGNOSIS — R079 Chest pain, unspecified: Secondary | ICD-10-CM | POA: Diagnosis not present

## 2022-04-16 DIAGNOSIS — M79601 Pain in right arm: Secondary | ICD-10-CM | POA: Diagnosis not present

## 2022-04-16 DIAGNOSIS — I700 Atherosclerosis of aorta: Secondary | ICD-10-CM | POA: Diagnosis not present

## 2022-04-16 LAB — CBC WITH DIFFERENTIAL/PLATELET
Abs Immature Granulocytes: 0.03 10*3/uL (ref 0.00–0.07)
Basophils Absolute: 0 10*3/uL (ref 0.0–0.1)
Basophils Relative: 1 %
Eosinophils Absolute: 0.1 10*3/uL (ref 0.0–0.5)
Eosinophils Relative: 2 %
HCT: 38.8 % (ref 36.0–46.0)
Hemoglobin: 12.1 g/dL (ref 12.0–15.0)
Immature Granulocytes: 1 %
Lymphocytes Relative: 20 %
Lymphs Abs: 0.9 10*3/uL (ref 0.7–4.0)
MCH: 30.2 pg (ref 26.0–34.0)
MCHC: 31.2 g/dL (ref 30.0–36.0)
MCV: 96.8 fL (ref 80.0–100.0)
Monocytes Absolute: 0.7 10*3/uL (ref 0.1–1.0)
Monocytes Relative: 14 %
Neutro Abs: 2.9 10*3/uL (ref 1.7–7.7)
Neutrophils Relative %: 62 %
Platelets: 88 10*3/uL — ABNORMAL LOW (ref 150–400)
RBC: 4.01 MIL/uL (ref 3.87–5.11)
RDW: 15.1 % (ref 11.5–15.5)
WBC: 4.6 10*3/uL (ref 4.0–10.5)
nRBC: 0 % (ref 0.0–0.2)

## 2022-04-16 LAB — COMPREHENSIVE METABOLIC PANEL
ALT: 23 U/L (ref 0–44)
AST: 37 U/L (ref 15–41)
Albumin: 3.8 g/dL (ref 3.5–5.0)
Alkaline Phosphatase: 64 U/L (ref 38–126)
Anion gap: 5 (ref 5–15)
BUN: 38 mg/dL — ABNORMAL HIGH (ref 8–23)
CO2: 21 mmol/L — ABNORMAL LOW (ref 22–32)
Calcium: 9.5 mg/dL (ref 8.9–10.3)
Chloride: 112 mmol/L — ABNORMAL HIGH (ref 98–111)
Creatinine, Ser: 1.54 mg/dL — ABNORMAL HIGH (ref 0.44–1.00)
GFR, Estimated: 34 mL/min — ABNORMAL LOW (ref >60)
Glucose, Bld: 121 mg/dL — ABNORMAL HIGH (ref 70–99)
Potassium: 4.7 mmol/L (ref 3.5–5.1)
Sodium: 138 mmol/L (ref 135–145)
Total Bilirubin: 0.5 mg/dL (ref 0.3–1.2)
Total Protein: 6.9 g/dL (ref 6.5–8.1)

## 2022-04-16 LAB — TROPONIN I (HIGH SENSITIVITY)
Troponin I (High Sensitivity): 4 ng/L (ref <18)
Troponin I (High Sensitivity): 4 ng/L (ref <18)

## 2022-04-16 MED ORDER — OXYCODONE-ACETAMINOPHEN 5-325 MG PO TABS
1.0000 | ORAL_TABLET | Freq: Once | ORAL | Status: AC
  Administered 2022-04-16: 1 via ORAL
  Filled 2022-04-16: qty 1

## 2022-04-16 NOTE — Discharge Instructions (Signed)
Testing today was very reassuring.  Your symptoms are likely not related to your heart.  Because of your history, however, we will refer you back to Dr. Harl Bowie for further consideration.  If you develop any chest pain, return to the ED.

## 2022-04-16 NOTE — ED Provider Notes (Signed)
[facility] EMERGENCY DEPARTMENT Provider Note   CSN: [id number] Arrival date & time: 04/15/22  2309     History  Chief Complaint  Patient presents with   Arm Pain    Patricia Jackson is a 82 y.o. female.  Patient presents to the emergency department for evaluation of bilateral arm pain.  Patient experiencing pain from her shoulders down to the hands on both of her arms.  Patient reports that this pain does occur sporadically for her but usually goes away fairly quickly.  Sometimes she takes nitroglycerin for this.  She did take 2 nitro tonight but it did not help the pain.  There does seem to be some element of worsening with movement of the arms, no numbness, tingling or weakness of the arms.  She does not have any specific neck complaints.  No chest pain or shortness of breath.       Home Medications Prior to Admission medications   Medication Sig Start Date End Date Taking? Authorizing Provider  acetaminophen (TYLENOL) 500 MG tablet Take 1,000 mg by mouth every 4 (four) hours as needed for mild pain.    [provider]  albuterol (PROVENTIL) (2.5 MG/3ML) 0.083% nebulizer solution Take 2.5 mg by nebulization every 6 (six) hours as needed for wheezing or shortness of breath.    [provider]  albuterol (VENTOLIN HFA) 108 (90 Base) MCG/ACT inhaler Inhale 1-2 puffs into the lungs every 6 (six) hours as needed for wheezing or shortness of breath. 03/02/22   Volney American, PA-C  amLODipine-olmesartan (AZOR) 10-40 MG tablet Take 1 tablet by mouth every morning. 06/23/21   Eugenie Filler, MD  amoxicillin (AMOXIL) 500 MG capsule Take 500 mg by mouth 2 (two) times daily. 01/11/22   [provider]  amoxicillin (AMOXIL) 500 MG capsule Take 1 capsule (500 mg total) by mouth 2 (two) times daily. 01/15/22   Diona Browner, DMD  apixaban (ELIQUIS) 5 MG TABS tablet TAKE ONE TABLET ('5MG'$  TOTAL) BY MOUTH TWOTIMES DAILY 03/01/22   Sueanne Margarita, MD   azithromycin (ZITHROMAX) 250 MG tablet Take first 2 tablets together, then 1 every day until finished. 03/02/22   Volney American, PA-C  buPROPion (WELLBUTRIN XL) 300 MG 24 hr tablet Take 300 mg by mouth daily. 11/05/19   [provider]  Cholecalciferol (VITAMIN D3) 50 MCG (2000 UT) TABS Take 2,000 Units by mouth daily.    [provider]  Copper Gluconate (COPPER CAPS PO) Take 6 mg by mouth daily.    [provider]  cyanocobalamin (VITAMIN B12) 1000 MCG tablet Take 1,000 mcg by mouth daily.    [provider]  denosumab (PROLIA) 60 MG/ML SOSY injection Inject 60 mg into the skin every 6 (six) months.    [provider]  fluticasone (FLONASE) 50 MCG/ACT nasal spray Place 2 sprays into both nostrils daily. Patient not taking: Reported on 01/13/2022 06/23/21   Eugenie Filler, MD  guaiFENesin (MUCINEX) 600 MG 12 hr tablet Take 1 tablet (600 mg total) by mouth 2 (two) times daily as needed. 03/02/22   Volney American, PA-C  ipratropium-albuterol (DUONEB) 0.5-2.5 (3) MG/3ML SOLN Take 3 mLs by nebulization every 4 (four) hours as needed. 03/02/22   Volney American, PA-C  levothyroxine (SYNTHROID) 50 MCG tablet Take 50 mcg by mouth daily before breakfast.  09/20/19   [provider]  loratadine (CLARITIN) 10 MG tablet Take 1 tablet (10 mg total) by mouth daily. Patient not taking: Reported  on 01/13/2022 06/23/21   Eugenie Filler, MD  metoprolol succinate (TOPROL XL) 25 MG 24 hr tablet Take 1 tablet (25 mg total) by mouth daily. 09/02/21   Sueanne Margarita, MD  nitroGLYCERIN (NITROSTAT) 0.4 MG SL tablet Place 0.4 mg under the tongue every 5 (five) minutes as needed for chest pain. 06/09/19   [provider]  predniSONE (DELTASONE) 20 MG tablet Take 2 tablets (40 mg total) by mouth daily with breakfast. 03/02/22   Volney American, PA-C  promethazine-dextromethorphan (PROMETHAZINE-DM) 6.25-15 MG/5ML syrup Take 5 mLs  by mouth 4 (four) times daily as needed. 03/02/22   Volney American, PA-C  rosuvastatin (CRESTOR) 20 MG tablet Take 20 mg by mouth daily.  02/26/17   [provider]  sertraline (ZOLOFT) 25 MG tablet Take 25 mg by mouth at bedtime.    [provider]  TRELEGY ELLIPTA 100-62.5-25 MCG/ACT AEPB Inhale 1 puff into the lungs daily. 06/13/21   [provider]      Allergies    Sulfa antibiotics    Review of Systems   Review of Systems  Physical Exam Updated Vital Signs BP (!) 147/86   Pulse 64   Temp 98.6 F (37 C)   Resp 19   Ht '5\' 7"'$  (1.702 m)   Wt 70.3 kg   SpO2 100%   BMI 24.28 kg/m  Physical Exam Vitals and nursing note reviewed.  Constitutional:      General: She is not in acute distress.    Appearance: She is well-developed.  HENT:     Head: Normocephalic and atraumatic.     Mouth/Throat:     Mouth: Mucous membranes are moist.  Eyes:     General: Vision grossly intact. Gaze aligned appropriately.     Extraocular Movements: Extraocular movements intact.     Conjunctiva/sclera: Conjunctivae normal.  Cardiovascular:     Rate and Rhythm: Normal rate and regular rhythm.     Pulses: Normal pulses.     Heart sounds: Normal heart sounds, S1 normal and S2 normal. No murmur heard.    No friction rub. No gallop.  Pulmonary:     Effort: Pulmonary effort is normal. No respiratory distress.     Breath sounds: Normal breath sounds.  Abdominal:     General: Bowel sounds are normal.     Palpations: Abdomen is soft.     Tenderness: There is no abdominal tenderness. There is no guarding or rebound.     Hernia: No hernia is present.  Musculoskeletal:        General: No swelling.     Cervical back: Full passive range of motion without pain, normal range of motion and neck supple. No spinous process tenderness or muscular tenderness. Normal range of motion.     Right lower leg: No edema.     Left lower leg: No edema.  Skin:    General: Skin is warm  and dry.     Capillary Refill: Capillary refill takes less than 2 seconds.     Findings: No ecchymosis, erythema, rash or wound.  Neurological:     General: No focal deficit present.     Mental Status: She is alert and oriented to person, place, and time.     GCS: GCS eye subscore is 4. GCS verbal subscore is 5. GCS motor subscore is 6.     Cranial Nerves: Cranial nerves 2-12 are intact.     Sensory: Sensation is intact.     Motor: Motor  function is intact.     Coordination: Coordination is intact.  Psychiatric:        Attention and Perception: Attention normal.        Mood and Affect: Mood normal.        Speech: Speech normal.        Behavior: Behavior normal.     ED Results / Procedures / Treatments   Labs (all labs ordered are listed, but only abnormal results are displayed) Labs Reviewed  CBC WITH DIFFERENTIAL/PLATELET - Abnormal; Notable for the following components:      Result Value   Platelets 88 (*)    All other components within normal limits  COMPREHENSIVE METABOLIC PANEL - Abnormal; Notable for the following components:   Chloride 112 (*)    CO2 21 (*)    Glucose, Bld 121 (*)    BUN 38 (*)    Creatinine, Ser 1.54 (*)    GFR, Estimated 34 (*)    All other components within normal limits  TROPONIN I (HIGH SENSITIVITY)  TROPONIN I (HIGH SENSITIVITY)    EKG EKG Interpretation  Date/Time:  Thursday April 15 2022 23:38:50 EST Ventricular Rate:  59 PR Interval:  174 QRS Duration: 94 QT Interval:  457 QTC Calculation: 453 R Axis:   89 Text Interpretation: Sinus rhythm Anteroseptal infarct, age indeterminate Baseline wander in lead(s) V2 No significant change since last tracing Confirmed by Orpah Greek [phone]) on 04/15/2022 11:45:41 PM  Radiology DG Chest 2 View  Result Date: 04/16/2022 CLINICAL DATA:  Bilateral arm pain. EXAM: CHEST - 2 VIEW COMPARISON:  March 02, 2022 FINDINGS: The heart size and mediastinal contours are within normal limits.  There is marked severity calcification of the aortic arch. The lungs are hyperinflated. Mild to moderate severity diffuse, chronic appearing increased interstitial lung markings are seen. There is no evidence of acute infiltrate, pleural effusion or pneumothorax. Radiopaque surgical clips are seen along the anterolateral aspect of the upper left chest wall. Degenerative changes seen throughout the thoracic spine. IMPRESSION: Chronic appearing increased interstitial lung markings without evidence of acute or active cardiopulmonary disease. Electronically Signed   By: Virgina Norfolk M.D.   On: 04/16/2022 01:06    Procedures Procedures    Medications Ordered in ED Medications  oxyCODONE-acetaminophen (PERCOCET/ROXICET) 5-325 MG per tablet 1 tablet (1 tablet Oral Given 04/16/22 0242)    ED Course/ Medical Decision Making/ A&P                           Medical Decision Making Amount and/or Complexity of Data Reviewed External Data Reviewed: labs, ECG and notes. Labs: ordered. Decision-making details documented in ED Course. Radiology: ordered and independent interpretation performed. Decision-making details documented in ED Course. ECG/medicine tests: ordered and independent interpretation performed. Decision-making details documented in ED Course.  Risk Prescription drug management.   Presents with bilateral arm pain.  Patient reports a history of heart attack and stenting.  She reports that when she had the heart attack, she was "out of it" and does not remember what she experienced symptom wise.  Since that time, however, she has intermittently had some pains in her arms similar to this.  She reports that it is usually transient but had lasted for a number of hours before coming to the ER.  She does not have associated chest pain.  There is no shortness of breath.  This usually occurs when she is sitting and doing nothing.  She  was at rest when the pain began tonight.  This does not seem  particularly cardiac.  Nitroglycerin did not help at prehospital.  EKG unchanged from prior.  Patient monitored here, troponin negative x 2.  Treated with analgesia.  Current presentation seems to be unlikely consistent with ACS or other cardiac etiology.  I feel that it is safe to discharge patient, however follow-up with cardiology.  She was given return precautions.        Final Clinical Impression(s) / ED Diagnoses Final diagnoses:  Pain in both upper extremities  Coronary artery disease involving native coronary artery of native heart, unspecified whether angina present    Rx / DC Orders ED Discharge Orders          Ordered    Ambulatory referral to Cardiology       Comments: If you have not heard from the Cardiology office within the next 72 hours please call [phone].   04/16/22 7867              Orpah Greek, MD 04/16/22 [phone]

## 2022-04-26 ENCOUNTER — Emergency Department (HOSPITAL_COMMUNITY)
Admission: EM | Admit: 2022-04-26 | Discharge: 2022-04-26 | Disposition: A | Discharge status: Home / Self Care | Payer: 59 | Attending: Emergency Medicine | Admitting: Emergency Medicine

## 2022-04-26 ENCOUNTER — Emergency Department (HOSPITAL_COMMUNITY): Payer: 59

## 2022-04-26 ENCOUNTER — Encounter (HOSPITAL_COMMUNITY): Payer: Self-pay | Admitting: Internal Medicine

## 2022-04-26 ENCOUNTER — Encounter (HOSPITAL_COMMUNITY): Payer: Self-pay | Admitting: Hematology

## 2022-04-26 DIAGNOSIS — I6523 Occlusion and stenosis of bilateral carotid arteries: Secondary | ICD-10-CM | POA: Diagnosis not present

## 2022-04-26 DIAGNOSIS — I4891 Unspecified atrial fibrillation: Secondary | ICD-10-CM | POA: Insufficient documentation

## 2022-04-26 DIAGNOSIS — S52501A Unspecified fracture of the lower end of right radius, initial encounter for closed fracture: Secondary | ICD-10-CM | POA: Diagnosis not present

## 2022-04-26 DIAGNOSIS — S62102A Fracture of unspecified carpal bone, left wrist, initial encounter for closed fracture: Secondary | ICD-10-CM | POA: Diagnosis not present

## 2022-04-26 DIAGNOSIS — M542 Cervicalgia: Secondary | ICD-10-CM | POA: Diagnosis not present

## 2022-04-26 DIAGNOSIS — R270 Ataxia, unspecified: Secondary | ICD-10-CM | POA: Diagnosis not present

## 2022-04-26 DIAGNOSIS — R0902 Hypoxemia: Secondary | ICD-10-CM | POA: Diagnosis not present

## 2022-04-26 DIAGNOSIS — M25539 Pain in unspecified wrist: Secondary | ICD-10-CM | POA: Diagnosis not present

## 2022-04-26 DIAGNOSIS — W19XXXA Unspecified fall, initial encounter: Secondary | ICD-10-CM | POA: Diagnosis not present

## 2022-04-26 DIAGNOSIS — Z79899 Other long term (current) drug therapy: Secondary | ICD-10-CM | POA: Diagnosis not present

## 2022-04-26 DIAGNOSIS — Z7901 Long term (current) use of anticoagulants: Secondary | ICD-10-CM | POA: Insufficient documentation

## 2022-04-26 DIAGNOSIS — M79602 Pain in left arm: Secondary | ICD-10-CM | POA: Insufficient documentation

## 2022-04-26 DIAGNOSIS — J811 Chronic pulmonary edema: Secondary | ICD-10-CM | POA: Diagnosis not present

## 2022-04-26 DIAGNOSIS — R6889 Other general symptoms and signs: Secondary | ICD-10-CM | POA: Diagnosis not present

## 2022-04-26 DIAGNOSIS — S0990XA Unspecified injury of head, initial encounter: Secondary | ICD-10-CM | POA: Diagnosis not present

## 2022-04-26 DIAGNOSIS — I499 Cardiac arrhythmia, unspecified: Secondary | ICD-10-CM | POA: Diagnosis not present

## 2022-04-26 DIAGNOSIS — S52592A Other fractures of lower end of left radius, initial encounter for closed fracture: Secondary | ICD-10-CM | POA: Diagnosis not present

## 2022-04-26 DIAGNOSIS — M4312 Spondylolisthesis, cervical region: Secondary | ICD-10-CM | POA: Diagnosis not present

## 2022-04-26 DIAGNOSIS — Z743 Need for continuous supervision: Secondary | ICD-10-CM | POA: Diagnosis not present

## 2022-04-26 MED ORDER — OXYCODONE-ACETAMINOPHEN 5-325 MG PO TABS: ORAL_TABLET | ORAL | 0 refills | Status: DC

## 2022-04-26 NOTE — ED Notes (Signed)
Dr. Roderic Palau at bedside to review splint application to LUE.

## 2022-04-26 NOTE — ED Notes (Signed)
Velcro splint placed on patient, but pt had immediate discoloration and pain to L wrist. Splint removed, Dr. Roderic Palau made aware.

## 2022-04-26 NOTE — ED Notes (Signed)
Per Dr. Roderic Palau, place velcro wrist splint and will reassess.

## 2022-04-26 NOTE — ED Notes (Signed)
Splint removed again d/t hand becoming discolored. Dr. Roderic Palau aware and will reassess.

## 2022-04-26 NOTE — ED Notes (Signed)
Splint removed at this time per Dr. Roderic Palau d/t pt's hand/fingers discolored.

## 2022-04-26 NOTE — ED Provider Notes (Signed)
[facility] Provider Note   CSN: [id number] Arrival date & time: 04/26/22  2202     History  Chief Complaint  Patient presents with   Nykerria Macconnell is a 82 y.o. female.  Patient comes in with a fall.  Patient states she just lost her balance and fell and hit her head.  She has pain behind her head and neck and also pain in her left arm.  Patient has a history of atrial fibs and is on Eliquis  The history is provided by the patient and medical records. No language interpreter was used.  Fall This is a new problem. The current episode started 1 to 2 hours ago. The problem occurs rarely. The problem has been resolved. Pertinent negatives include no chest pain, no abdominal pain and no headaches. Nothing aggravates the symptoms. Nothing relieves the symptoms. She has tried nothing for the symptoms.       Home Medications Prior to Admission medications   Medication Sig Start Date End Date Taking? Authorizing Provider  acetaminophen (TYLENOL) 500 MG tablet Take 1,000 mg by mouth every 4 (four) hours as needed for mild pain.   Yes [provider]  albuterol (PROVENTIL) (2.5 MG/3ML) 0.083% nebulizer solution Take 2.5 mg by nebulization every 6 (six) hours as needed for wheezing or shortness of breath.   Yes [provider]  albuterol (VENTOLIN HFA) 108 (90 Base) MCG/ACT inhaler Inhale 1-2 puffs into the lungs every 6 (six) hours as needed for wheezing or shortness of breath. 03/02/22  Yes Volney American, PA-C  amLODipine-olmesartan (AZOR) 10-40 MG tablet Take 1 tablet by mouth every morning. 06/23/21  Yes Eugenie Filler, MD  apixaban (ELIQUIS) 5 MG TABS tablet TAKE ONE TABLET ('5MG'$  TOTAL) BY MOUTH TWOTIMES DAILY 03/01/22  Yes Turner, Eber Hong, MD  buPROPion (WELLBUTRIN XL) 300 MG 24 hr tablet Take 300 mg by mouth daily. 11/05/19  Yes [provider]  Cholecalciferol (VITAMIN D3) 50 MCG (2000 UT) TABS  Take 2,000 Units by mouth daily.   Yes [provider]  Copper Gluconate (COPPER CAPS PO) Take 6 mg by mouth daily.   Yes [provider]  cyanocobalamin (VITAMIN B12) 1000 MCG tablet Take 1,000 mcg by mouth daily.   Yes [provider]  denosumab (PROLIA) 60 MG/ML SOSY injection Inject 60 mg into the skin every 6 (six) months.   Yes [provider]  ipratropium-albuterol (DUONEB) 0.5-2.5 (3) MG/3ML SOLN Take 3 mLs by nebulization every 4 (four) hours as needed. 03/02/22  Yes Volney American, PA-C  levothyroxine (SYNTHROID) 50 MCG tablet Take 50 mcg by mouth daily before breakfast.  09/20/19  Yes [provider]  metoprolol succinate (TOPROL XL) 25 MG 24 hr tablet Take 1 tablet (25 mg total) by mouth daily. 09/02/21  Yes Turner, Eber Hong, MD  nitroGLYCERIN (NITROSTAT) 0.4 MG SL tablet Place 0.4 mg under the tongue every 5 (five) minutes as needed for chest pain. 06/09/19  Yes [provider]  oxyCODONE-acetaminophen (PERCOCET/ROXICET) 5-325 MG tablet Take 1 every 6 hours for pain not relieved by Tylenol alone 04/26/22  Yes Milton Ferguson, MD  rosuvastatin (CRESTOR) 20 MG tablet Take 20 mg by mouth daily.  02/26/17  Yes [provider]  sertraline (ZOLOFT) 25 MG tablet Take 25 mg by mouth at bedtime.   Yes [provider]  TRELEGY ELLIPTA 100-62.5-25 MCG/ACT AEPB Inhale 1 puff into the lungs daily. 06/13/21  Yes [provider]  amoxicillin (AMOXIL) 500 MG capsule Take 1 capsule (500 mg total) by mouth 2 (two) times daily. Patient not taking: Reported on 04/26/2022 01/15/22   Diona Browner, DMD  azithromycin (ZITHROMAX) 250 MG tablet Take first 2 tablets together, then 1 every day until finished. Patient not taking: Reported on 04/26/2022 03/02/22   Volney American, PA-C  fluticasone [facility]) 50 MCG/ACT nasal spray Place 2 sprays into both nostrils daily. Patient not taking: Reported on 01/13/2022 06/23/21    Eugenie Filler, MD  guaiFENesin (MUCINEX) 600 MG 12 hr tablet Take 1 tablet (600 mg total) by mouth 2 (two) times daily as needed. Patient not taking: Reported on 04/26/2022 03/02/22   Volney American, PA-C  loratadine (CLARITIN) 10 MG tablet Take 1 tablet (10 mg total) by mouth daily. Patient not taking: Reported on 01/13/2022 06/23/21   Eugenie Filler, MD  predniSONE (DELTASONE) 20 MG tablet Take 2 tablets (40 mg total) by mouth daily with breakfast. Patient not taking: Reported on 04/26/2022 03/02/22   Volney American, PA-C  promethazine-dextromethorphan (PROMETHAZINE-DM) 6.25-15 MG/5ML syrup Take 5 mLs by mouth 4 (four) times daily as needed. Patient not taking: Reported on 04/26/2022 03/02/22   Volney American, PA-C      Allergies    Sulfa antibiotics    Review of Systems   Review of Systems  Constitutional:  Negative for appetite change and fatigue.  HENT:  Negative for congestion, ear discharge and sinus pressure.   Eyes:  Negative for discharge.  Respiratory:  Negative for cough.   Cardiovascular:  Negative for chest pain.  Gastrointestinal:  Negative for abdominal pain and diarrhea.  Genitourinary:  Negative for frequency and hematuria.  Musculoskeletal:  Negative for back pain.       Left arm pain  Skin:  Negative for rash.  Neurological:  Negative for seizures and headaches.  Psychiatric/Behavioral:  Negative for hallucinations.     Physical Exam Updated Vital Signs BP (!) 142/64   Pulse 74   Temp (!) 97.5 F (36.4 C) (Oral)   Resp 16   Ht '5\' 6"'$  (1.676 m)   Wt 68 kg   SpO2 98%   BMI 24.21 kg/m  Physical Exam Vitals and nursing note reviewed.  Constitutional:      Appearance: She is well-developed.  HENT:     Head: Normocephalic.     Nose: Nose normal.  Eyes:     General: No scleral icterus.    Conjunctiva/sclera: Conjunctivae normal.  Neck:     Thyroid: No thyromegaly.  Cardiovascular:     Rate and Rhythm: Normal rate and  regular rhythm.     Heart sounds: No murmur heard.    No friction rub. No gallop.  Pulmonary:     Breath sounds: No stridor. No wheezing or rales.  Chest:     Chest wall: No tenderness.  Abdominal:     General: There is no distension.     Tenderness: There is no abdominal tenderness. There is no rebound.  Musculoskeletal:     Cervical back: Neck supple.     Comments: Deformity to left breast  Lymphadenopathy:     Cervical: No cervical adenopathy.  Skin:    Findings: No erythema or rash.  Neurological:     Mental Status: She is alert and oriented to person, place, and time.     Motor: No abnormal muscle tone.     Coordination: Coordination normal.  Psychiatric:  Behavior: Behavior normal.     ED Results / Procedures / Treatments   Labs (all labs ordered are listed, but only abnormal results are displayed) Labs Reviewed - No data to display  EKG None  Radiology CT Head Wo Contrast  Result Date: 04/26/2022 CLINICAL DATA:  Ataxia EXAM: CT HEAD WITHOUT CONTRAST CT CERVICAL SPINE WITHOUT CONTRAST TECHNIQUE: Multidetector CT imaging of the head and cervical spine was performed following the standard protocol without intravenous contrast. Multiplanar CT image reconstructions of the cervical spine were also generated. RADIATION DOSE REDUCTION: This exam was performed according to the departmental dose-optimization program which includes automated exposure control, adjustment of the mA and/or kV according to patient size and/or use of iterative reconstruction technique. COMPARISON:  CT C Spine 11/07/19 FINDINGS: CT HEAD FINDINGS Brain: No evidence of acute infarction, hemorrhage, hydrocephalus, extra-axial collection or mass lesion/mass effect. Vascular: No hyperdense vessel or unexpected calcification. Skull: Normal. Negative for fracture or focal lesion. Sinuses/Orbits: Lens replacement. Mucosal thickening posterior ethmoid air cells. Middle Other: None. CT CERVICAL SPINE FINDINGS  Alignment: Grade 1 anterolisthesis of C3-C4.  Retrolisthesis C6. Skull base and vertebrae: No acute fracture. No primary bone lesion or focal pathologic process. Soft tissues and spinal canal: No prevertebral fluid or swelling. No visible canal hematoma. Disc levels: No evidence of high-grade stenosis. Upper chest: Pleuroparenchymal scarring edema Other: severe atherosclerotic plaque at the carotid bifurcations bilaterally. IMPRESSION: 1. No acute intracranial abnormality. 2. No acute cervical spine fracture or traumatic listhesis. 3. 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 Electronically Signed   By: Marin Roberts M.D.   On: 04/26/2022 11:03   CT Cervical Spine Wo Contrast  Result Date: 04/26/2022 CLINICAL DATA:  Ataxia EXAM: CT HEAD WITHOUT CONTRAST CT CERVICAL SPINE WITHOUT CONTRAST TECHNIQUE: Multidetector CT imaging of the head and cervical spine was performed following the standard protocol without intravenous contrast. Multiplanar CT image reconstructions of the cervical spine were also generated. RADIATION DOSE REDUCTION: This exam was performed according to the departmental dose-optimization program which includes automated exposure control, adjustment of the mA and/or kV according to patient size and/or use of iterative reconstruction technique. COMPARISON:  CT C Spine 11/07/19 FINDINGS: CT HEAD FINDINGS Brain: No evidence of acute infarction, hemorrhage, hydrocephalus, extra-axial collection or mass lesion/mass effect. Vascular: No hyperdense vessel or unexpected calcification. Skull: Normal. Negative for fracture or focal lesion. Sinuses/Orbits: Lens replacement. Mucosal thickening posterior ethmoid air cells. Middle Other: None. CT CERVICAL SPINE FINDINGS Alignment: Grade 1 anterolisthesis of C3-C4.  Retrolisthesis C6. Skull base and vertebrae: No acute fracture. No primary bone lesion or focal pathologic process. Soft tissues and spinal canal: No prevertebral fluid or  swelling. No visible canal hematoma. Disc levels: No evidence of high-grade stenosis. Upper chest: Pleuroparenchymal scarring edema Other: severe atherosclerotic plaque at the carotid bifurcations bilaterally. IMPRESSION: 1. No acute intracranial abnormality. 2. No acute cervical spine fracture or traumatic listhesis. 3. 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 Electronically Signed   By: Marin Roberts M.D.   On: 04/26/2022 11:03   DG Wrist Complete Left  Result Date: 04/26/2022 CLINICAL DATA:  Fall.  Wrist deformity. EXAM: LEFT WRIST - COMPLETE 3+ VIEW COMPARISON:  None Available. FINDINGS: The bones appear mildly demineralized. There is an acute mildly displaced and posteriorly angulated fracture of the distal radial metaphysis which demonstrates no definite involvement of the distal articular surface or distal radioulnar joint. The distal ulna appears intact. The carpal bones appear intact and normally aligned. There is distal forearm  soft tissue swelling without evidence of foreign body or soft tissue emphysema. IMPRESSION: Acute mildly displaced and angulated fracture of the distal radius as described. Electronically Signed   By: Richardean Sale M.D.   On: 04/26/2022 10:58    Procedures Procedures    Medications Ordered in ED Medications - No data to display  ED Course/ Medical Decision Making/ A&P  When the patient had a sugar-tong splint put on her left wrist her fingers turn blue.  Then we put a Velcro wrist splint on and they turn blue again.  Finally put a Velcro wrist splint on very loosely and her fingers did not turn blue                           Medical Decision Making Amount and/or Complexity of Data Reviewed Radiology: ordered.  Risk Prescription drug management.  This patient presents to the ED for concern of head injury and injury to right wrist, this involves an extensive number of treatment options, and is a complaint that carries with it a  high risk of complications and morbidity.  The differential diagnosis includes fall with an injury to right wrist and head injury   Co morbidities that complicate the patient evaluation  Atrial fibs on Eliquis   Additional history obtained:  Additional history obtained from patient and family member External records from outside source obtained and reviewed including hospital records lab's   Lab Tests: No labs Imaging Studies ordered:  I ordered imaging studies including CT head and cervical spine with plain films of left wrist I independently visualized and interpreted imaging which showed fracture left wrist otherwise negative I agree with the radiologist interpretation   Cardiac Monitoring: / EKG:  The patient was maintained on a cardiac monitor.  I personally viewed and interpreted the cardiac monitored which showed an underlying rhythm of: Atrial fibs   Consultations Obtained:  I requested consultation with the orthopedics,  and discussed lab and imaging findings as well as pertinent plan - they recommend: Use wrist splint and follow-up in office   Problem List / ED Course / Critical interventions / Medication management  Atrial fibs and head injury and fractured wrist No medicines given Reevaluation of the patient after these medicines showed that the patient improved I have reviewed the patients home medicines and have made adjustments as needed   Social Determinants of Health:  None   Test / Admission - Considered:  None  Patient with a head injury with CT scan shows no bleeding.  Patient also with a fractured left wrist.  She will follow-up with orthopedics        Final Clinical Impression(s) / ED Diagnoses Final diagnoses:  Fall, initial encounter  Injury of head, initial encounter  Closed fracture of right wrist, initial encounter    Rx / DC Orders ED Discharge Orders          Ordered    oxyCODONE-acetaminophen (PERCOCET/ROXICET) 5-325  MG tablet        04/26/22 1421              Milton Ferguson, MD 04/27/22 1233

## 2022-04-26 NOTE — ED Notes (Signed)
Splint placed back on patient looser per verbal order from Dr. Roderic Palau.

## 2022-04-26 NOTE — Discharge Instructions (Addendum)
Keep arm elevated is much as possible above your chest.  Follow-up with Dr. Aline Brochure within a week.

## 2022-04-26 NOTE — ED Triage Notes (Signed)
Pt BIB [facility] from home C/O mechanical fall this AM resulting in L wrist pain and deformity. Pt also hit head during fall, reports taking Eliquis. Pt also endorses blurry vision in L eye since Saturday. MD aware of patient.

## 2022-04-27 ENCOUNTER — Telehealth: Payer: Self-pay | Admitting: Orthopedic Surgery

## 2022-04-27 NOTE — Progress Notes (Signed)
[facility] Note  05/03/2022     CHIEF COMPLAINT Patient presents for Retina Evaluation   HISTORY OF PRESENT ILLNESS: Patricia Jackson is a 82 y.o. female who presents to the clinic today for:   HPI     Retina Evaluation   In both eyes.  This started 7 months ago.  Duration of 7 months.  I, the attending physician,  performed the HPI with the patient and updated documentation appropriately.        Comments   Patient here for Retina Evaluation. Patient states vision not good. Can't see detail. Vision went out the other day. Starting to come back. Everything is white. No eye pain. . OS had surgery.       Last edited by Bernarda Caffey, MD on 05/03/2022 12:54 PM.    Pt is a previous [facility] pt, she hasn't seen him since June, she states she was due for an injection OD on the 8th, but was unable to get it due to insurance reasons, pt fell last week and broke her arm and hit her head, she states the vision has gone out in her left eye, she states it looks like a "bright light", pt states she has had surgery in the left eye  Referring physician: Celene Squibb, MD [place],  [place] [id number]  HISTORICAL INFORMATION:   Selected notes from the MEDICAL RECORD NUMBER Dr. Zadie Rhine pt -- transferred care due to insurance LEE: 06.19.23 Ocular Hx- PEHCR OU -- history of IVA and laser photocoagulation OD; h/o PPV OS PMH-    CURRENT MEDICATIONS: No current outpatient medications on file. (Ophthalmic Drugs)   No current facility-administered medications for this visit. (Ophthalmic Drugs)   Current Outpatient Medications (Other)  Medication Sig   acetaminophen (TYLENOL) 500 MG tablet Take 1,000 mg by mouth every 4 (four) hours as needed for mild pain.   albuterol (PROVENTIL) (2.5 MG/3ML) 0.083% nebulizer solution Take 2.5 mg by nebulization every 6 (six) hours as needed for wheezing or shortness of breath.   albuterol (VENTOLIN HFA) 108 (90 Base)  MCG/ACT inhaler Inhale 1-2 puffs into the lungs every 6 (six) hours as needed for wheezing or shortness of breath.   amLODipine-olmesartan (AZOR) 10-40 MG tablet Take 1 tablet by mouth every morning.   apixaban (ELIQUIS) 5 MG TABS tablet TAKE ONE TABLET (5MG TOTAL) BY MOUTH TWOTIMES DAILY   buPROPion (WELLBUTRIN XL) 300 MG 24 hr tablet Take 300 mg by mouth daily.   Cholecalciferol (VITAMIN D3) 50 MCG (2000 UT) TABS Take 2,000 Units by mouth daily.   Copper Gluconate (COPPER CAPS PO) Take 6 mg by mouth daily.   cyanocobalamin (VITAMIN B12) 1000 MCG tablet Take 1,000 mcg by mouth daily.   denosumab (PROLIA) 60 MG/ML SOSY injection Inject 60 mg into the skin every 6 (six) months.   ipratropium-albuterol (DUONEB) 0.5-2.5 (3) MG/3ML SOLN Take 3 mLs by nebulization every 4 (four) hours as needed.   levothyroxine (SYNTHROID) 50 MCG tablet Take 50 mcg by mouth daily before breakfast.    metoprolol succinate (TOPROL XL) 25 MG 24 hr tablet Take 1 tablet (25 mg total) by mouth daily.   nitroGLYCERIN (NITROSTAT) 0.4 MG SL tablet Place 0.4 mg under the tongue every 5 (five) minutes as needed for chest pain.   oxyCODONE-acetaminophen (PERCOCET/ROXICET) 5-325 MG tablet Take 1 every 6 hours for pain not relieved by Tylenol alone   rosuvastatin (CRESTOR) 20 MG tablet Take 20 mg by  mouth daily.    sertraline (ZOLOFT) 25 MG tablet Take 25 mg by mouth at bedtime.   TRELEGY ELLIPTA 100-62.5-25 MCG/ACT AEPB Inhale 1 puff into the lungs daily.   amoxicillin (AMOXIL) 500 MG capsule Take 1 capsule (500 mg total) by mouth 2 (two) times daily. (Patient not taking: Reported on 04/26/2022)   azithromycin (ZITHROMAX) 250 MG tablet Take first 2 tablets together, then 1 every day until finished. (Patient not taking: Reported on 04/26/2022)   fluticasone (FLONASE) 50 MCG/ACT nasal spray Place 2 sprays into both nostrils daily. (Patient not taking: Reported on 01/13/2022)   guaiFENesin (MUCINEX) 600 MG 12 hr tablet Take 1 tablet  (600 mg total) by mouth 2 (two) times daily as needed. (Patient not taking: Reported on 04/26/2022)   loratadine (CLARITIN) 10 MG tablet Take 1 tablet (10 mg total) by mouth daily. (Patient not taking: Reported on 01/13/2022)   predniSONE (DELTASONE) 20 MG tablet Take 2 tablets (40 mg total) by mouth daily with breakfast. (Patient not taking: Reported on 04/26/2022)   promethazine-dextromethorphan (PROMETHAZINE-DM) 6.25-15 MG/5ML syrup Take 5 mLs by mouth 4 (four) times daily as needed. (Patient not taking: Reported on 04/26/2022)   No current facility-administered medications for this visit. (Other)   REVIEW OF SYSTEMS: ROS   Positive for: Eyes Negative for: Constitutional, Gastrointestinal, Neurological, Skin, Genitourinary, Musculoskeletal, HENT, Endocrine, Cardiovascular, Respiratory, Psychiatric, Allergic/Imm, Heme/Lymph Last edited by Theodore Demark, COA on 05/03/2022  9:01 AM.     ALLERGIES Allergies  Allergen Reactions   Sulfa Antibiotics     Unknown reaction    PAST MEDICAL HISTORY Past Medical History:  Diagnosis Date   A-fib [facility])    Atrial flutter ([place])    On Eliquis in 8/17 but discontinued after stent placement   Breast cancer ([place])    remote   CAD in native artery 03/16/2016   COPD (chronic obstructive pulmonary disease) ([place])    Dysrhythmia    Hypertension    Iron deficiency anemia 06/12/2021   NSTEMI (non-ST elevated myocardial infarction) ([place])    2017   S/P angioplasty with stent 03/15/16 DES Resolute, pLCX 03/16/2016   Vitreous hemorrhage of left eye ([place]) 08/01/2019   Past Surgical History:  Procedure Laterality Date   APPENDECTOMY     BREAST SURGERY Left    CARDIAC CATHETERIZATION N/A 03/15/2016   Procedure: Left Heart Cath and Coronary Angiography;  Surgeon: Belva Crome, MD;  Location: [facility];  Service: Cardiovascular;  Laterality: N/A;   CARDIAC CATHETERIZATION N/A 03/15/2016   Procedure: Coronary Stent Intervention;  Surgeon: Belva Crome, MD;  Location: [place] [facility];  Service: Cardiovascular;  Laterality: N/A;   COLONOSCOPY     remote   COLONOSCOPY N/A 05/26/2016   Procedure: COLONOSCOPY;  Surgeon: Daneil Dolin, MD;  [facility];  Service: Endoscopy;  Laterality: N/A;  845   ESOPHAGOGASTRODUODENOSCOPY N/A 05/26/2016   Procedure: ESOPHAGOGASTRODUODENOSCOPY (EGD);  Surgeon: Daneil Dolin, MD;  Location[facility];  Service: Endoscopy;  Laterality: N/A;   EYE SURGERY     MALONEY DILATION N/A 05/26/2016   Procedure: Venia Minks DILATION;  Surgeon: Daneil Dolin, MD;  [facility];  Service: Endoscopy;  Laterality: N/A;   TOOTH EXTRACTION N/A 01/15/2022   Procedure: DENTAL RESTORATION/EXTRACTIONS;  Surgeon: Diona Browner, DMD;  Location: [facility];  Service: Oral Surgery;  Laterality: N/A;   VITRECTOMY Left 10/03/2019   Dr. Zadie Rhine, Vitrectomy, Focal Laser, Removal of Silicone Oil   FAMILY  HISTORY Family History  Problem Relation Age of Onset   Heart disease Mother    COPD Father    Cancer Sister        unknown primary   Cancer Brother        unknown primary   Cancer Brother    Hypertension Son    Colon cancer Neg Hx    SOCIAL HISTORY Social History   Tobacco Use   Smoking status: Former    Types: Cigarettes    Quit date: 12/28/2014    Years since quitting: 7.3   Smokeless tobacco: Never   Tobacco comments:    Patient quit within the last 10 years  Vaping Use   Vaping Use: Never used  Substance Use Topics   Alcohol use: Never   Drug use: Never       OPHTHALMIC EXAM:  Base Eye Exam     Visual Acuity (Snellen - Linear)       Right Left   Dist [place] HM 20/200 +1   Dist ph [place] NI NI         Tonometry (Tonopen, 8:55 AM)       Right Left   Pressure 11 07         Pupils       Dark Light Shape React APD   Right 5 5 Round Minimal None   Left 5 5 Round Minimal None         Visual Fields (Counting fingers)       Left Right    Full    Restrictions  Total  superior temporal, inferior temporal, superior nasal, inferior nasal deficiencies         Extraocular Movement       Right Left    Full, Ortho Full, Ortho         Neuro/Psych     Oriented x3: Yes   Mood/Affect: Normal         Dilation     Both eyes: 1.0% Mydriacyl, 2.5% Phenylephrine @ 8:55 AM           Slit Lamp and Fundus Exam     Slit Lamp Exam       Right Left   Lids/Lashes Dermatochalasis - upper lid, mild MGD Dermatochalasis - upper lid   Conjunctiva/Sclera White and quiet White and quiet   Cornea arcus, tear film debris, 1+ Punctate epithelial erosions, well healed cataract wound mild arcus, 1+ Punctate epithelial erosions, trace fine endo pigment   Anterior Chamber deep, 0.5+cell and pigment Deep, 2+fine cell and pigment   Iris Round and dilated Round and dilated   Lens 3 piece PC IOL in good position 3 piece PC IOL in good position   Anterior Vitreous fine pigment post vitrectomy, +RBC         Fundus Exam       Right Left   Posterior Vitreous  post vitrectomy   Disc mild Pallor, Sharp rim hazy view, perfused, Sharp rim   C/D Ratio 0.2 0.2   Macula Detached with small macular hole hazy view, grossly flat   Vessels Tortuous attenuated   Periphery Scattered patches of PRP that are attached, large subretinal heme inferiorly--dark red and in color, scattered peripheral subretinal fibrosis hazy view, grossly attached, 360 CR scarring           Refraction     Manifest Refraction   Unable to refract better          IMAGING AND PROCEDURES  Imaging  and Procedures for 05/03/2022  OCT, Retina - OU - Both Eyes       Right Eye Quality was good. Scan locations included subfoveal. Central Foveal Thickness: 378. Progression has no prior data. 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   Left Eye Quality was borderline. Scan locations included subfoveal.  Central Foveal Thickness: 267. Progression has no prior data. Findings include normal foveal contour, no IRF, no SRF, retinal drusen , epiretinal membrane, outer retinal atrophy (Mild ERM; diffuse ORA w/ focal subfoveal sparing; no IRF/SRF).   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 CME - cystoid macular edema. PED - pigment epithelial detachment. IRF - intraretinal fluid. SRF - subretinal fluid. EZ - ellipsoid zone. ERM - epiretinal membrane. ORA - outer retinal atrophy. ORT - outer retinal tubulation. SRHM - subretinal hyper-reflective material. IRHM - intraretinal hyper-reflective material            ASSESSMENT/PLAN:    ICD-10-CM   1. Right retinal detachment  H33.21 OCT, Retina - OU - Both Eyes    2. Macular hole of right eye  H35.341 OCT, Retina - OU - Both Eyes    3. Exudative age-related macular degeneration of both eyes with active choroidal neovascularization ([place])  H35.3231 OCT, Retina - OU - Both Eyes    4. Vitreous hemorrhage of left eye ([place])  H43.12     5. Essential hypertension  I10     6. Hypertensive retinopathy of both eyes  H35.033     7. Pseudophakia, both eyes  Z96.1      1,2. Retinal detachment w/ macular hole, OD - mac off detachment -- suspect exudative ARMD w/ rhegmatogenous component (small mac hole)  - periphery attached in areas of PRP, but subretinal fluid source appears to be from inferior PEHCR lesion / hemorrhage  - exam and OCT show bullous macular detachment with small macular hole - BCVA OD HM from 20/60 in June 2023 - The incidence, risk factors, and natural history of retinal detachment was discussed with patient.   - Potential treatment options including delimiting laser, pneumatic retinopexy, scleral buckle, and  vitrectomy, cryotherapy and laser, and the use of air, gas, and oil discussed with patient. - The risks of blindness, loss of vision, infection, hemorrhage, cataract progression or lens displacement were discussed with patient. - recommend referral to [facility] service for evaluation and management - f/u here PRN  3,4. Exudative age related macular degeneration, both eyes -- PEHCR  - previously managed by Dr. Zadie Rhine -- lost to f/u since June 2023  - s/p multiple IVA OD and laser photocoagulation OD  - The incidence pathology and anatomy of wet AMD / PEHCR discussed  - exam shows active subretinal hemorrhages OU  - OD with exudative / rhegmatogenous detachment  - OS with vitreous hemorrhage  - recommend evaluation and management at [facility]  5,6. Hypertensive retinopathy OU - discussed importance of tight BP control - monitor  7. Pseudophakia OU  - s/p CE/IOL OU in [place]  - 3-piece IOLs in good position  - monitor   Ophthalmic Meds Ordered this visit:  No orders of the defined types were placed in this encounter.    Return if symptoms  worsen or fail to improve.  There are no Patient Instructions on file for this visit.   Explained the diagnoses, plan, and follow up with the patient and they expressed understanding.  Patient expressed understanding of the importance of proper follow up care.   This document serves as a record of services personally performed by Gardiner Sleeper, MD, PhD. It was created on their behalf by San Jetty. Owens Shark, OA an ophthalmic technician. The creation of this record is the provider's dictation and/or activities during the visit.    Electronically signed by: San Jetty. Owens Shark, [place] 01.23.2024 12:58 PM  Gardiner Sleeper, M.D., Ph.D. Diseases & Surgery of the Retina and Vitreous [facility]  I have reviewed the above documentation for accuracy and completeness, and I agree with the above. Gardiner Sleeper, M.D., Ph.D.  05/03/22 1:07 PM   Abbreviations: M myopia (nearsighted); A astigmatism; H hyperopia (farsighted); P presbyopia; Mrx spectacle prescription;  CTL contact lenses; OD right eye; OS left eye; OU both eyes  XT exotropia; ET esotropia; PEK punctate epithelial keratitis; PEE punctate epithelial erosions; DES dry eye syndrome; MGD meibomian gland dysfunction; ATs artificial tears; PFAT's preservative free artificial tears; [place] nuclear sclerotic cataract; PSC posterior subcapsular cataract; ERM epi-retinal membrane; PVD posterior vitreous detachment; RD retinal detachment; DM diabetes mellitus; DR diabetic retinopathy; NPDR non-proliferative diabetic retinopathy; PDR proliferative diabetic retinopathy; CSME clinically significant macular edema; DME diabetic macular edema; dbh dot blot hemorrhages; CWS cotton wool spot; POAG primary open angle glaucoma; C/D cup-to-disc ratio; HVF humphrey visual field; GVF goldmann visual field; OCT optical coherence tomography; IOP intraocular pressure; BRVO Branch retinal vein occlusion; CRVO central retinal vein occlusion; CRAO central retinal artery occlusion; BRAO branch retinal artery occlusion; RT retinal tear; SB scleral buckle; PPV pars plana vitrectomy; VH Vitreous hemorrhage; PRP panretinal laser photocoagulation; IVK intravitreal kenalog; VMT vitreomacular traction; MH Macular hole;  NVD neovascularization of the disc; NVE neovascularization elsewhere; AREDS age related eye disease study; ARMD age related macular degeneration; POAG primary open angle glaucoma; EBMD epithelial/anterior basement membrane dystrophy; ACIOL anterior chamber intraocular lens; IOL intraocular lens; PCIOL posterior chamber intraocular lens; Phaco/IOL phacoemulsification with intraocular lens placement; [place] photorefractive keratectomy; LASIK laser assisted in situ keratomileusis; HTN hypertension; DM diabetes mellitus; COPD chronic obstructive pulmonary disease

## 2022-04-27 NOTE — Telephone Encounter (Signed)
Patient's son lvm to cancel his mom's appointment, he stated that she was going to have to cancel.  I have not cancelled it yet, I did call and lvm to see if there is anyway she could keep this appt as she has a fracture.  The son called back and stated that she is going to a doctor in [place], that another family member had already gotten her that appt and they didn't communicate well.

## 2022-04-28 ENCOUNTER — Ambulatory Visit: Payer: 59 | Admitting: Orthopedic Surgery

## 2022-04-28 DIAGNOSIS — S52502A Unspecified fracture of the lower end of left radius, initial encounter for closed fracture: Secondary | ICD-10-CM | POA: Diagnosis not present

## 2022-04-28 DIAGNOSIS — M25532 Pain in left wrist: Secondary | ICD-10-CM | POA: Insufficient documentation

## 2022-04-30 ENCOUNTER — Telehealth: Payer: Self-pay

## 2022-04-30 NOTE — Telephone Encounter (Signed)
     Patient  visit on 04/26/2022  at [facility] was for fall.  Have you been able to follow up with your primary care physician? Patient has seen an Doctor, general practice.  The patient was or was not able to obtain any needed medicine or equipment. No medication prescribed.  Are there diet recommendations that you are having difficulty following? No  Patient expresses understanding of discharge instructions and education provided has no other needs at this time.    [facility] Resource Care Guide   [email or web address]  ?? [phone]   Website: [email or web address]  [email or web address]

## 2022-05-02 DIAGNOSIS — S52509A Unspecified fracture of the lower end of unspecified radius, initial encounter for closed fracture: Secondary | ICD-10-CM | POA: Insufficient documentation

## 2022-05-03 ENCOUNTER — Encounter (INDEPENDENT_AMBULATORY_CARE_PROVIDER_SITE_OTHER): Payer: Self-pay | Admitting: Ophthalmology

## 2022-05-03 ENCOUNTER — Ambulatory Visit (INDEPENDENT_AMBULATORY_CARE_PROVIDER_SITE_OTHER): Payer: 59 | Admitting: Ophthalmology

## 2022-05-03 DIAGNOSIS — H3321 Serous retinal detachment, right eye: Secondary | ICD-10-CM

## 2022-05-03 DIAGNOSIS — H35033 Hypertensive retinopathy, bilateral: Secondary | ICD-10-CM | POA: Diagnosis not present

## 2022-05-03 DIAGNOSIS — H35341 Macular cyst, hole, or pseudohole, right eye: Secondary | ICD-10-CM | POA: Diagnosis not present

## 2022-05-03 DIAGNOSIS — I10 Essential (primary) hypertension: Secondary | ICD-10-CM

## 2022-05-03 DIAGNOSIS — H35371 Puckering of macula, right eye: Secondary | ICD-10-CM

## 2022-05-03 DIAGNOSIS — H4312 Vitreous hemorrhage, left eye: Secondary | ICD-10-CM | POA: Diagnosis not present

## 2022-05-03 DIAGNOSIS — Z961 Presence of intraocular lens: Secondary | ICD-10-CM

## 2022-05-03 DIAGNOSIS — H353231 Exudative age-related macular degeneration, bilateral, with active choroidal neovascularization: Secondary | ICD-10-CM

## 2022-05-06 DIAGNOSIS — S52502A Unspecified fracture of the lower end of left radius, initial encounter for closed fracture: Secondary | ICD-10-CM | POA: Diagnosis not present

## 2022-05-06 DIAGNOSIS — M25532 Pain in left wrist: Secondary | ICD-10-CM | POA: Diagnosis not present

## 2022-05-09 ENCOUNTER — Emergency Department (HOSPITAL_COMMUNITY)
Admission: EM | Admit: 2022-05-09 | Discharge: 2022-05-09 | Disposition: A | Discharge status: Home / Self Care | Payer: 59 | Attending: Student | Admitting: Student

## 2022-05-09 ENCOUNTER — Encounter (HOSPITAL_COMMUNITY): Payer: Self-pay

## 2022-05-09 ENCOUNTER — Emergency Department (HOSPITAL_COMMUNITY): Payer: 59

## 2022-05-09 ENCOUNTER — Other Ambulatory Visit: Payer: Self-pay

## 2022-05-09 DIAGNOSIS — M545 Low back pain, unspecified: Secondary | ICD-10-CM | POA: Diagnosis not present

## 2022-05-09 DIAGNOSIS — R270 Ataxia, unspecified: Secondary | ICD-10-CM | POA: Diagnosis not present

## 2022-05-09 DIAGNOSIS — Z79899 Other long term (current) drug therapy: Secondary | ICD-10-CM | POA: Diagnosis not present

## 2022-05-09 DIAGNOSIS — I129 Hypertensive chronic kidney disease with stage 1 through stage 4 chronic kidney disease, or unspecified chronic kidney disease: Secondary | ICD-10-CM | POA: Diagnosis not present

## 2022-05-09 DIAGNOSIS — R252 Cramp and spasm: Secondary | ICD-10-CM | POA: Insufficient documentation

## 2022-05-09 DIAGNOSIS — N189 Chronic kidney disease, unspecified: Secondary | ICD-10-CM | POA: Insufficient documentation

## 2022-05-09 DIAGNOSIS — J449 Chronic obstructive pulmonary disease, unspecified: Secondary | ICD-10-CM | POA: Insufficient documentation

## 2022-05-09 DIAGNOSIS — M549 Dorsalgia, unspecified: Secondary | ICD-10-CM | POA: Diagnosis not present

## 2022-05-09 DIAGNOSIS — M47816 Spondylosis without myelopathy or radiculopathy, lumbar region: Secondary | ICD-10-CM | POA: Diagnosis not present

## 2022-05-09 DIAGNOSIS — N3090 Cystitis, unspecified without hematuria: Secondary | ICD-10-CM | POA: Diagnosis not present

## 2022-05-09 DIAGNOSIS — Z743 Need for continuous supervision: Secondary | ICD-10-CM | POA: Diagnosis not present

## 2022-05-09 DIAGNOSIS — N3000 Acute cystitis without hematuria: Secondary | ICD-10-CM | POA: Diagnosis not present

## 2022-05-09 LAB — BASIC METABOLIC PANEL
Anion gap: 10 (ref 5–15)
BUN: 33 mg/dL — ABNORMAL HIGH (ref 8–23)
CO2: 19 mmol/L — ABNORMAL LOW (ref 22–32)
Calcium: 9.8 mg/dL (ref 8.9–10.3)
Chloride: 108 mmol/L (ref 98–111)
Creatinine, Ser: 1.86 mg/dL — ABNORMAL HIGH (ref 0.44–1.00)
GFR, Estimated: 27 mL/min — ABNORMAL LOW (ref >60)
Glucose, Bld: 100 mg/dL — ABNORMAL HIGH (ref 70–99)
Potassium: 4.8 mmol/L (ref 3.5–5.1)
Sodium: 137 mmol/L (ref 135–145)

## 2022-05-09 LAB — CBC WITH DIFFERENTIAL/PLATELET
Abs Immature Granulocytes: 0.04 10*3/uL (ref 0.00–0.07)
Basophils Absolute: 0 10*3/uL (ref 0.0–0.1)
Basophils Relative: 1 %
Eosinophils Absolute: 0.1 10*3/uL (ref 0.0–0.5)
Eosinophils Relative: 1 %
HCT: 35.6 % — ABNORMAL LOW (ref 36.0–46.0)
Hemoglobin: 11.4 g/dL — ABNORMAL LOW (ref 12.0–15.0)
Immature Granulocytes: 1 %
Lymphocytes Relative: 14 %
Lymphs Abs: 0.8 10*3/uL (ref 0.7–4.0)
MCH: 30.2 pg (ref 26.0–34.0)
MCHC: 32 g/dL (ref 30.0–36.0)
MCV: 94.2 fL (ref 80.0–100.0)
Monocytes Absolute: 0.7 10*3/uL (ref 0.1–1.0)
Monocytes Relative: 11 %
Neutro Abs: 4.5 10*3/uL (ref 1.7–7.7)
Neutrophils Relative %: 72 %
Platelets: 113 10*3/uL — ABNORMAL LOW (ref 150–400)
RBC: 3.78 MIL/uL — ABNORMAL LOW (ref 3.87–5.11)
RDW: 14.2 % (ref 11.5–15.5)
WBC: 6.1 10*3/uL (ref 4.0–10.5)
nRBC: 0 % (ref 0.0–0.2)

## 2022-05-09 LAB — URINALYSIS, ROUTINE W REFLEX MICROSCOPIC
Bilirubin Urine: NEGATIVE
Glucose, UA: NEGATIVE mg/dL
Hgb urine dipstick: NEGATIVE
Ketones, ur: NEGATIVE mg/dL
Nitrite: NEGATIVE
Protein, ur: NEGATIVE mg/dL
Specific Gravity, Urine: 1.015 (ref 1.005–1.030)
Trans Epithel, UA: 2
pH: 5 (ref 5.0–8.0)

## 2022-05-09 MED ORDER — CEPHALEXIN 500 MG PO CAPS
500.0000 mg | ORAL_CAPSULE | Freq: Once | ORAL | Status: AC
  Administered 2022-05-09: 500 mg via ORAL
  Filled 2022-05-09: qty 1

## 2022-05-09 MED ORDER — OXYCODONE-ACETAMINOPHEN 5-325 MG PO TABS: 1.0000 | ORAL_TABLET | Freq: Four times a day (QID) | ORAL | 0 refills | Status: DC | PRN

## 2022-05-09 MED ORDER — CEPHALEXIN 500 MG PO CAPS: 500.0000 mg | ORAL_CAPSULE | Freq: Three times a day (TID) | ORAL | 0 refills | Status: DC

## 2022-05-09 MED ORDER — OXYCODONE-ACETAMINOPHEN 5-325 MG PO TABS
1.0000 | ORAL_TABLET | Freq: Once | ORAL | Status: AC
  Administered 2022-05-09: 1 via ORAL
  Filled 2022-05-09: qty 1

## 2022-05-09 MED ORDER — OXYCODONE-ACETAMINOPHEN 5-325 MG PO TABS: 1.0000 | ORAL_TABLET | ORAL | 0 refills | Status: DC | PRN

## 2022-05-09 NOTE — ED Provider Triage Note (Signed)
Emergency Medicine Provider Triage Evaluation Note  Patricia Jackson , a 82 y.o. female  was evaluated in triage.  Pt complains of left-sided low back pain.  Pain has been present for 3 to 4 days.  She notes a mechanical fall couple weeks ago in which she suffered a left wrist fracture.  Left wrist and forearm currently in a cast.  She does not feel that she injured her back during the fall.  She describes pain of her lower back associated with standing and walking.  Pain radiates across her back.  Pain does not radiate into her lower extremities.  She denies any abdominal pain, urine or bowel changes, fever, chills, numbness or weakness of her lower extremities.  Review of Systems  Positive: Low back pain Negative: Numbness or weakness of the lower extremities, abdominal pain, urine or bowel changes, saddle anesthesias  Physical Exam  BP (!) 190/169 (BP Location: Right Arm)   Pulse 65   Temp 97.8 F (36.6 C) (Oral)   Resp 18   Ht '5\' 6"'$  (1.676 m)   Wt 72.6 kg   SpO2 97%   BMI 25.82 kg/m  Gen:   Awake, no distress   Resp:  Normal effort  MSK:   Moves extremities without difficulty, positive straight leg raise on the left, tenderness to palpation of left lumbar paraspinal muscles Other:    Medical Decision Making  Medically screening exam initiated at 3:39 PM.  Appropriate orders placed.  Taylan Mayhan was informed that the remainder of the evaluation will be completed by another provider, this initial triage assessment does not replace that evaluation, and the importance of remaining in the ED until their evaluation is complete.     Kem Parkinson, PA-C 05/09/22 1541

## 2022-05-09 NOTE — ED Triage Notes (Signed)
Pt reports left lower back pain x 3-4 days.  Reports it is exacerbated by standing and walking.

## 2022-05-09 NOTE — ED Provider Notes (Signed)
[facility] Provider Note   CSN: [id number] Arrival date & time: 05/09/22  1433     History {Add pertinent medical, surgical, social history, OB history to HPI:1} Chief Complaint  Patient presents with   Back Pain    Patricia Jackson is a 82 y.o. female.   Back Pain Associated symptoms: no dysuria and no fever        Patricia Jackson is a 82 y.o. female with past medical history of hypertension, back pain, atrial fibrillation, CKD, COPD who presents to the Emergency Department complaining of left lower back pain x 3 to 4 days.  Describes aching pain across her lower back that originates on the left side and radiates to the right.  Pain is exacerbated by change in position, walking, standing.  Pain improves while at rest.  She describes a mechanical fall that occurred approximately 2 weeks ago in which she suffered a fracture of her left wrist.  She has been seeing orthopedics for this.  She is unsure whether the fall caused any injury of her back.  She denies any pain of her abdomen, fever, chills, pain numbness or weakness of her lower extremities, urine or bowel changes.  Home Medications Prior to Admission medications   Medication Sig Start Date End Date Taking? Authorizing Provider  acetaminophen (TYLENOL) 500 MG tablet Take 1,000 mg by mouth every 4 (four) hours as needed for mild pain.    [provider]  albuterol (PROVENTIL) (2.5 MG/3ML) 0.083% nebulizer solution Take 2.5 mg by nebulization every 6 (six) hours as needed for wheezing or shortness of breath.    [provider]  albuterol (VENTOLIN HFA) 108 (90 Base) MCG/ACT inhaler Inhale 1-2 puffs into the lungs every 6 (six) hours as needed for wheezing or shortness of breath. 03/02/22   Volney American, PA-C  amLODipine-olmesartan (AZOR) 10-40 MG tablet Take 1 tablet by mouth every morning. 06/23/21   Eugenie Filler, MD  amoxicillin (AMOXIL) 500 MG capsule  Take 1 capsule (500 mg total) by mouth 2 (two) times daily. Patient not taking: Reported on 04/26/2022 01/15/22   Diona Browner, DMD  apixaban (ELIQUIS) 5 MG TABS tablet TAKE ONE TABLET ('5MG'$  TOTAL) BY MOUTH TWOTIMES DAILY 03/01/22   Sueanne Margarita, MD  azithromycin (ZITHROMAX) 250 MG tablet Take first 2 tablets together, then 1 every day until finished. Patient not taking: Reported on 04/26/2022 03/02/22   Volney American, PA-C  buPROPion (WELLBUTRIN XL) 300 MG 24 hr tablet Take 300 mg by mouth daily. 11/05/19   [provider]  Cholecalciferol (VITAMIN D3) 50 MCG (2000 UT) TABS Take 2,000 Units by mouth daily.    [provider]  Copper Gluconate (COPPER CAPS PO) Take 6 mg by mouth daily.    [provider]  cyanocobalamin (VITAMIN B12) 1000 MCG tablet Take 1,000 mcg by mouth daily.    [provider]  denosumab (PROLIA) 60 MG/ML SOSY injection Inject 60 mg into the skin every 6 (six) months.    [provider]  fluticasone (FLONASE) 50 MCG/ACT nasal spray Place 2 sprays into both nostrils daily. Patient not taking: Reported on 01/13/2022 06/23/21   Eugenie Filler, MD  guaiFENesin (MUCINEX) 600 MG 12 hr tablet Take 1 tablet (600 mg total) by mouth 2 (two) times daily as needed. Patient not taking: Reported on 04/26/2022 03/02/22   Volney American, PA-C  ipratropium-albuterol (DUONEB) 0.5-2.5 (3) MG/3ML SOLN Take 3 mLs by nebulization every  4 (four) hours as needed. 03/02/22   Volney American, PA-C  levothyroxine (SYNTHROID) 50 MCG tablet Take 50 mcg by mouth daily before breakfast.  09/20/19   [provider]  loratadine (CLARITIN) 10 MG tablet Take 1 tablet (10 mg total) by mouth daily. Patient not taking: Reported on 01/13/2022 06/23/21   Eugenie Filler, MD  metoprolol succinate (TOPROL XL) 25 MG 24 hr tablet Take 1 tablet (25 mg total) by mouth daily. 09/02/21   Sueanne Margarita, MD  nitroGLYCERIN (NITROSTAT) 0.4 MG SL  tablet Place 0.4 mg under the tongue every 5 (five) minutes as needed for chest pain. 06/09/19   [provider]  oxyCODONE-acetaminophen (PERCOCET/ROXICET) 5-325 MG tablet Take 1 every 6 hours for pain not relieved by Tylenol alone 04/26/22   Milton Ferguson, MD  predniSONE (DELTASONE) 20 MG tablet Take 2 tablets (40 mg total) by mouth daily with breakfast. Patient not taking: Reported on 04/26/2022 03/02/22   Volney American, PA-C  promethazine-dextromethorphan (PROMETHAZINE-DM) 6.25-15 MG/5ML syrup Take 5 mLs by mouth 4 (four) times daily as needed. Patient not taking: Reported on 04/26/2022 03/02/22   Volney American, PA-C  rosuvastatin (CRESTOR) 20 MG tablet Take 20 mg by mouth daily.  02/26/17   [provider]  sertraline (ZOLOFT) 25 MG tablet Take 25 mg by mouth at bedtime.    [provider]  TRELEGY ELLIPTA 100-62.5-25 MCG/ACT AEPB Inhale 1 puff into the lungs daily. 06/13/21   [provider]      Allergies    Sulfa antibiotics    Review of Systems   Review of Systems  Constitutional:  Negative for appetite change, chills and fever.  Gastrointestinal:  Negative for constipation, nausea and vomiting.  Genitourinary:  Negative for difficulty urinating, dysuria, flank pain and frequency.  Musculoskeletal:  Positive for back pain.  Skin:  Negative for color change and rash.    Physical Exam Updated Vital Signs BP 134/70 (BP Location: Right Arm)   Pulse (!) 57   Temp 98.1 F (36.7 C) (Oral)   Resp 17   Ht '5\' 6"'$  (1.676 m)   Wt 72.6 kg   SpO2 100%   BMI 25.82 kg/m  Physical Exam  ED Results / Procedures / Treatments   Labs (all labs ordered are listed, but only abnormal results are displayed) Labs Reviewed  CBC WITH DIFFERENTIAL/PLATELET - Abnormal; Notable for the following components:      Result Value   RBC 3.78 (*)    Hemoglobin 11.4 (*)    HCT 35.6 (*)    Platelets 113 (*)    All other components within normal limits   BASIC METABOLIC PANEL - Abnormal; Notable for the following components:   CO2 19 (*)    Glucose, Bld 100 (*)    BUN 33 (*)    Creatinine, Ser 1.86 (*)    GFR, Estimated 27 (*)    All other components within normal limits  URINALYSIS, ROUTINE W REFLEX MICROSCOPIC - Abnormal; Notable for the following components:   Leukocytes,Ua MODERATE (*)    Bacteria, UA RARE (*)    All other components within normal limits  URINE CULTURE    EKG None  Radiology CT Lumbar Spine Wo Contrast  Result Date: 05/09/2022 CLINICAL DATA:  Ataxia, trauma, fell 2 weeks ago, left lower back pain for 3-4 days EXAM: CT LUMBAR SPINE WITHOUT CONTRAST TECHNIQUE: Multidetector CT imaging of the lumbar spine was performed without intravenous contrast administration. Multiplanar CT image reconstructions  were also generated. RADIATION DOSE REDUCTION: This exam was performed according to the departmental dose-optimization program which includes automated exposure control, adjustment of the mA and/or kV according to patient size and/or use of iterative reconstruction technique. COMPARISON:  None Available. FINDINGS: Segmentation: 5 lumbar type vertebrae. Alignment: Normal. Vertebrae: No acute fracture or focal pathologic process. Paraspinal and other soft tissues: The paraspinal soft tissues are unremarkable. There is diffuse atherosclerosis seen throughout the abdominal aorta and its branches. Remaining retroperitoneal structures are unremarkable. Disc levels: Findings at individual levels are as follows: T12/L1: Unremarkable. L1/L2: Minimal broad-based disc bulge without significant compressive sequela. L2/L3: There is marked joint space narrowing, with circumferential disc bulge and bilateral facet and ligamentum flavum hypertrophy. This results in moderate to severe central canal stenosis and moderate symmetrical bilateral neural foraminal encroachment. L3/L4: Mild circumferential disc bulge with bilateral facet ligamentum flavum  hypertrophy results in mild central canal stenosis. Minimal symmetrical neural foraminal encroachment. L4/L5: Mild circumferential disc bulge with bilateral facet and ligamentum flavum hypertrophy results in mild central canal stenosis. Minimal symmetrical bilateral neural foraminal encroachment. L5/S1: Bilateral facet hypertrophy. No significant disc disease. No compressive sequela. IMPRESSION: 1. No acute lumbar spine fracture. 2. Multilevel lumbar spondylosis and facet hypertrophy as above, most pronounced at the L2-3 level with severe central canal stenosis and moderate symmetrical bilateral neural foraminal encroachment. 3.  Aortic Atherosclerosis (ICD10-I70.0). Electronically Signed   By: Randa Ngo M.D.   On: 05/09/2022 16:27    Procedures Procedures  {Document cardiac monitor, telemetry assessment procedure when appropriate:1}  Medications Ordered in ED Medications  cephALEXin (KEFLEX) capsule 500 mg (has no administration in time range)  oxyCODONE-acetaminophen (PERCOCET/ROXICET) 5-325 MG per tablet 1 tablet (1 tablet Oral Given 05/09/22 1630)    ED Course/ Medical Decision Making/ A&P   {   Click here for ABCD2, HEART and other calculatorsREFRESH Note before signing :1}                          Medical Decision Making Amount and/or Complexity of Data Reviewed Labs: ordered. Radiology: ordered.  Risk Prescription drug management.   ***  {Document critical care time when appropriate:1} {Document review of labs and clinical decision tools ie heart score, Chads2Vasc2 etc:1}  {Document your independent review of radiology images, and any outside records:1} {Document your discussion with family members, caretakers, and with consultants:1} {Document social determinants of health affecting pt's care:1} {Document your decision making why or why not admission, treatments were needed:1} Final Clinical Impression(s) / ED Diagnoses Final diagnoses:  None    Rx / DC Orders ED  Discharge Orders     None

## 2022-05-09 NOTE — Discharge Instructions (Signed)
Your urine test today shows that you likely have an infection.  You have been prescribed antibiotics for this.  Please drink plenty of water.  You have also been prescribed pain medication for your back.  Try to alternate ice and heat to your lower back.  Use your walker do not stand or walk without it.  Please call your primary care provider to arrange a follow-up appointment in a few days.  Return to the emergency department for any new or worsening symptoms.

## 2022-05-10 MED FILL — Oxycodone w/ Acetaminophen Tab 5-325 MG: ORAL | Qty: 6 | Status: AC

## 2022-05-12 LAB — URINE CULTURE: Culture: 10000 — AB

## 2022-05-13 ENCOUNTER — Telehealth (HOSPITAL_BASED_OUTPATIENT_CLINIC_OR_DEPARTMENT_OTHER): Payer: Self-pay | Admitting: *Deleted

## 2022-05-13 NOTE — Progress Notes (Signed)
ED Antimicrobial Stewardship Positive Culture Follow Up   Patricia Jackson is an 82 y.o. female who presented to [facility] on 05/09/2022 with a chief complaint of  Chief Complaint  Patient presents with   Back Pain    Recent Results (from the past 720 hour(s))  Urine Culture (for pregnant, neutropenic or urologic patients or patients with an indwelling urinary catheter)     Status: Abnormal   Collection Time: 05/09/22  4:40 PM   Specimen: Urine, Clean Catch  Result Value Ref Range Status   Specimen Description   Final    URINE, CLEAN CATCH Performed at [facility], [address], [place], [place] [address]    Special Requests   Final    NONE Performed at [facility] [address] [place],  [address]    Culture 10,000 COLONIES/mL ESCHERICHIA COLI (A)  Final   Report Status 05/12/2022 FINAL  Final   Organism ID, Bacteria ESCHERICHIA COLI (A)  Final      Susceptibility   Escherichia coli - MIC*    AMPICILLIN >=32 RESISTANT Resistant     CEFAZOLIN >=64 RESISTANT Resistant     CEFEPIME <=0.12 SENSITIVE Sensitive     CEFTRIAXONE 1 SENSITIVE Sensitive     CIPROFLOXACIN >=4 RESISTANT Resistant     GENTAMICIN <=1 SENSITIVE Sensitive     IMIPENEM <=0.25 SENSITIVE Sensitive     NITROFURANTOIN <=16 SENSITIVE Sensitive     TRIMETH/SULFA >=320 RESISTANT Resistant     AMPICILLIN/SULBACTAM >=32 RESISTANT Resistant     PIP/TAZO 8 SENSITIVE Sensitive     * 10,000 COLONIES/mL ESCHERICHIA COLI    '[x]'$  Treated with cephalexin, organism resistant to prescribed antimicrobial  New antibiotic prescription: No further treatment indicated for asymptomatic bacteriuria, only 10k colonies of Ecoli on culture.  ED Provider: Davonna Belling, MD   Chance 05/13/2022, 7:47 AM Clinical Pharmacist Monday - Friday phone -  [phone] Saturday - Sunday phone - [phone]

## 2022-05-13 NOTE — Telephone Encounter (Signed)
Post ED Visit - Positive Culture Follow-up  Culture report reviewed by antimicrobial stewardship pharmacist: [place] Team '[]'$  Elenor Quinones, Pharm.D. '[]'$  Heide Guile, Pharm.D., BCPS AQ-ID '[]'$  Parks Neptune, Pharm.D., BCPS '[]'$  Alycia Rossetti, Pharm.D., BCPS '[]'$  [place]ison, Pharm.D., BCPS, AAHIVP '[]'$  Legrand Como, Pharm.D., BCPS, AAHIVP '[]'$  Salome Arnt, PharmD, BCPS '[]'$  Johnnette Gourd, PharmD, BCPS '[]'$  Hughes Better, PharmD, BCPS '[]'$  Leeroy Cha, PharmD '[]'$  Laqueta Linden, PharmD, BCPS '[]'$  Albertina Parr, PharmD  [facility] Team '[]'$  Leodis Sias, PharmD '[]'$  Lindell Spar, PharmD '[]'$  Royetta Asal, PharmD '[]'$  Graylin Shiver, Rph '[]'$  Rema Fendt) Glennon Mac, PharmD '[]'$  Arlyn Dunning, PharmD '[]'$  Netta Cedars, PharmD '[]'$  Dia Sitter, PharmD '[]'$  Leone Haven, PharmD '[]'$  Gretta Arab, PharmD '[]'$  Theodis Shove, PharmD '[]'$  Peggyann Juba, PharmD '[]'$  Reuel Boom, PharmD   Positive urine culture Treated with Cephalexin , organism sensitive to the same and no further patient follow-up is required at this time.  Harlon Flor [facility] 05/13/2022, 10:00 AM

## 2022-05-14 DIAGNOSIS — Z01818 Encounter for other preprocedural examination: Secondary | ICD-10-CM | POA: Diagnosis not present

## 2022-05-14 DIAGNOSIS — J449 Chronic obstructive pulmonary disease, unspecified: Secondary | ICD-10-CM | POA: Diagnosis not present

## 2022-05-14 DIAGNOSIS — H35349 Macular cyst, hole, or pseudohole, unspecified eye: Secondary | ICD-10-CM | POA: Diagnosis not present

## 2022-05-14 DIAGNOSIS — Z95820 Peripheral vascular angioplasty status with implants and grafts: Secondary | ICD-10-CM | POA: Diagnosis not present

## 2022-05-14 DIAGNOSIS — N1832 Chronic kidney disease, stage 3b: Secondary | ICD-10-CM | POA: Diagnosis not present

## 2022-05-14 DIAGNOSIS — I10 Essential (primary) hypertension: Secondary | ICD-10-CM | POA: Diagnosis not present

## 2022-05-14 DIAGNOSIS — D696 Thrombocytopenia, unspecified: Secondary | ICD-10-CM | POA: Diagnosis not present

## 2022-05-18 ENCOUNTER — Telehealth: Payer: Self-pay | Admitting: Cardiology

## 2022-05-18 NOTE — Telephone Encounter (Signed)
   Pre-operative Risk Assessment    Patient Name: Patricia Jackson  DOB: 1940/04/24 MRN: [id number]      Request for Surgical Clearance    Procedure:   Right Vitrectomy  Date of Surgery:  Clearance 05/26/22                                 Surgeon:  Dr. Winona Legato Group or Practice Name:  [facility] Phone number:  [phone] Fax number:  [phone]   Type of Clearance Requested:   - Pharmacy:  Hold Apixaban (Eliquis) Hold for 3 days   Type of Anesthesia:  MAC   Additional requests/questions:  Please advise surgeon/provider what medications should be held.  Signed, Belisicia Lavell Anchors   05/18/2022, 12:37 PM

## 2022-05-18 NOTE — Telephone Encounter (Signed)
Patient with diagnosis of afib on Eliquis for anticoagulation.    Procedure: Right Vitrectomy  Date of procedure: 05/26/22   CHA2DS2-VASc Score = 5   This indicates a 7.2% annual risk of stroke. The patient's score is based upon: CHF History: 0 HTN History: 1 Diabetes History: 0 Stroke History: 0 Vascular Disease History: 1 Age Score: 2 Gender Score: 1      CrCl 28 ml/min  Per office protocol, patient can hold Eliquis for 3 days prior to procedure.    **This guidance is not considered finalized until pre-operative APP has relayed final recommendations.**

## 2022-05-18 NOTE — Telephone Encounter (Signed)
Pharmacy please advise on holding Eliquis prior to right vitrectomy scheduled for 05/26/2022. Thank you.

## 2022-05-18 NOTE — Telephone Encounter (Signed)
   Patient Name: Patricia Jackson  DOB: 1940/04/30 MRN: [id number]  Primary Cardiologist: None  Clinical pharmacists have reviewed the patient's past medical history, labs, and current medications as part of preoperative protocol coverage. The following recommendations have been made: Procedure: Right Vitrectomy  Date of procedure: 05/26/22     CHA2DS2-VASc Score = 5   This indicates a 7.2% annual risk of stroke. The patient's score is based upon: CHF History: 0 HTN History: 1 Diabetes History: 0 Stroke History: 0 Vascular Disease History: 1 Age Score: 2 Gender Score: 1       CrCl 28 ml/min   Per office protocol, patient can hold Eliquis for 3 days prior to procedure.     I will route this recommendation to the requesting party via Epic fax function and remove from pre-op pool.  Please call with questions.  Mable Fill, Marissa Nestle, NP 05/18/2022, 1:07 PM

## 2022-05-20 ENCOUNTER — Ambulatory Visit: Payer: 59 | Attending: Cardiology | Admitting: Cardiology

## 2022-05-20 ENCOUNTER — Encounter: Payer: Self-pay | Admitting: Cardiology

## 2022-05-20 VITALS — BP 100/60 | HR 51 | Ht 67.0 in | Wt 141.8 lb

## 2022-05-20 DIAGNOSIS — I2510 Atherosclerotic heart disease of native coronary artery without angina pectoris: Secondary | ICD-10-CM

## 2022-05-20 DIAGNOSIS — I6523 Occlusion and stenosis of bilateral carotid arteries: Secondary | ICD-10-CM

## 2022-05-20 DIAGNOSIS — Z01810 Encounter for preprocedural cardiovascular examination: Secondary | ICD-10-CM

## 2022-05-20 DIAGNOSIS — I10 Essential (primary) hypertension: Secondary | ICD-10-CM | POA: Diagnosis not present

## 2022-05-20 DIAGNOSIS — I4892 Unspecified atrial flutter: Secondary | ICD-10-CM

## 2022-05-20 NOTE — Patient Instructions (Signed)
Medication Instructions:  Your physician recommends that you continue on your current medications as directed. Please refer to the Current Medication list given to you today.   Labwork: None today  Testing/Procedures: Your physician has requested that you have a carotid duplex. This test is an ultrasound of the carotid arteries in your neck. It looks at blood flow through these arteries that supply the brain with blood. Allow one hour for this exam. There are no restrictions or special instructions.   Follow-Up: 6 months  Any Other Special Instructions Will Be Listed Below (If Applicable).  If you need a refill on your cardiac medications before your next appointment, please call your pharmacy.

## 2022-05-20 NOTE — Progress Notes (Signed)
Clinical Summary Patricia Jackson is a 82 y.o.female seen today for follow up of the following medical problems.      1. Atypical chest pain/CAD - last stent 03/2016 - recent admission with chest pain - described as back pain radiating down both arms that woke her from sleep -started lower neck/upper back, radiatiated to both arms  Sharp pain, worst with position. Pain lasted approx 1 hour. No SOB, no diaphoresis, no N/V. No recurrence of pain since discharge - no evidence of ACS by EKG or enzymes - echo 11/2013 LVEF Q000111Q, grade I diastolic dysfunction - CT chest without aneurysm or dissection, no PE. +atherosclerotic changes in aorta.      -last visit reported  mild nonspecific pains at times. Left chest, pinching feeling. Lasts about 1 minute. Mild dizziness at times. Can occur at rest or with activity.  -comcpliant with meds     - no chest pains. 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 Aflutter - admit 11/2015 pneumonia, found to be in aflutter - denies any recent symptoms   - history of severe anemia during admission Jan 2018 while on ASA/plavix/eliquis after stent   - no palpitaitons - no bleeding on eliquis.      3. COPD - followed by pulmonary     4. Anemia - elquis stopped during prior admission with anemia Jan 2018, transfused at that time. She was on brilinta and eliquis at the time, changed to just plavix.        4. Raynauds syndrome - followed by rheum     5. HTN - compliant with meds  6.Carotid stenosis - 06/2017 carotid US: RICA 123456, LICA [id number]  7. Preoperative evaluation - considering eye surgery under MAC, needs to hold eliquis   Past Medical History:  Diagnosis Date   A-fib [facility])    Atrial flutter ([place])    On Eliquis in 8/17 but discontinued after stent placement   Breast cancer ([place])    remote   CAD in native artery 03/16/2016   COPD (chronic obstructive pulmonary disease) ([place])    Dysrhythmia    Hypertension     Iron deficiency anemia 06/12/2021   NSTEMI (non-ST elevated myocardial infarction) ([place])    2017   S/P angioplasty with stent 03/15/16 DES Resolute, pLCX 03/16/2016   Vitreous hemorrhage of left eye ([place]) 08/01/2019     Allergies  Allergen Reactions   Sulfa Antibiotics     Unknown reaction      Current Outpatient Medications  Medication Sig Dispense Refill   albuterol (PROVENTIL) (2.5 MG/3ML) 0.083% nebulizer solution Take 2.5 mg by nebulization every 6 (six) hours as needed for wheezing or shortness of breath.     albuterol (VENTOLIN HFA) 108 (90 Base) MCG/ACT inhaler Inhale 1-2 puffs into the lungs every 6 (six) hours as needed for wheezing or shortness of breath. 18 g 0   amLODipine-olmesartan (AZOR) 10-40 MG tablet Take 1 tablet by mouth every morning.     amoxicillin (AMOXIL) 500 MG capsule Take 1 capsule (500 mg total) by mouth 2 (two) times daily. (Patient not taking: Reported on 04/26/2022) 14 capsule 0   apixaban (ELIQUIS) 5 MG TABS tablet TAKE ONE TABLET (5MG TOTAL) BY MOUTH TWOTIMES DAILY 60 tablet 5   azithromycin (ZITHROMAX) 250 MG tablet Take first 2 tablets together, then 1 every day until finished. (Patient not taking: Reported on 04/26/2022) 6 tablet 0   buPROPion (WELLBUTRIN XL) 300 MG 24  hr tablet Take 300 mg by mouth daily.     cephALEXin (KEFLEX) 500 MG capsule Take 1 capsule (500 mg total) by mouth 3 (three) times daily. 21 capsule 0   Cholecalciferol (VITAMIN D3) 50 MCG (2000 UT) TABS Take 2,000 Units by mouth daily.     Copper Gluconate (COPPER CAPS PO) Take 6 mg by mouth daily.     cyanocobalamin (VITAMIN B12) 1000 MCG tablet Take 1,000 mcg by mouth daily.     denosumab (PROLIA) 60 MG/ML SOSY injection Inject 60 mg into the skin every 6 (six) months.     fluticasone (FLONASE) 50 MCG/ACT nasal spray Place 2 sprays into both nostrils daily. (Patient not taking: Reported on 01/13/2022) 9.9 mL 0   guaiFENesin (MUCINEX) 600 MG 12 hr tablet Take 1 tablet (600 mg total)  by mouth 2 (two) times daily as needed. (Patient not taking: Reported on 04/26/2022) 20 tablet 0   ipratropium-albuterol (DUONEB) 0.5-2.5 (3) MG/3ML SOLN Take 3 mLs by nebulization every 4 (four) hours as needed. 360 mL 0   levothyroxine (SYNTHROID) 50 MCG tablet Take 50 mcg by mouth daily before breakfast.      loratadine (CLARITIN) 10 MG tablet Take 1 tablet (10 mg total) by mouth daily. (Patient not taking: Reported on 01/13/2022) 30 tablet 1   metoprolol succinate (TOPROL XL) 25 MG 24 hr tablet Take 1 tablet (25 mg total) by mouth daily. 90 tablet 3   nitroGLYCERIN (NITROSTAT) 0.4 MG SL tablet Place 0.4 mg under the tongue every 5 (five) minutes as needed for chest pain.     oxyCODONE-acetaminophen (PERCOCET/ROXICET) 5-325 MG tablet Take 1 tablet by mouth every 6 (six) hours as needed for severe pain. 6 tablet 0   oxyCODONE-acetaminophen (PERCOCET/ROXICET) 5-325 MG tablet Take 1 tablet by mouth every 4 (four) hours as needed. 4 tablet 0   predniSONE (DELTASONE) 20 MG tablet Take 2 tablets (40 mg total) by mouth daily with breakfast. (Patient not taking: Reported on 04/26/2022) 10 tablet 0   promethazine-dextromethorphan (PROMETHAZINE-DM) 6.25-15 MG/5ML syrup Take 5 mLs by mouth 4 (four) times daily as needed. (Patient not taking: Reported on 04/26/2022) 100 mL 0   rosuvastatin (CRESTOR) 20 MG tablet Take 20 mg by mouth daily.      sertraline (ZOLOFT) 25 MG tablet Take 25 mg by mouth at bedtime.     TRELEGY ELLIPTA 100-62.5-25 MCG/ACT AEPB Inhale 1 puff into the lungs daily.     No current facility-administered medications for this visit.     Past Surgical History:  Procedure Laterality Date   APPENDECTOMY     BREAST SURGERY Left    CARDIAC CATHETERIZATION N/A 03/15/2016   Procedure: Left Heart Cath and Coronary Angiography;  Surgeon: Belva Crome, MD;  Location: [place] [facility];  Service: Cardiovascular;  Laterality: N/A;   CARDIAC CATHETERIZATION N/A 03/15/2016   Procedure: Coronary  Stent Intervention;  Surgeon: Belva Crome, MD;  Location: [place] [facility];  Service: Cardiovascular;  Laterality: N/A;   COLONOSCOPY     remote   COLONOSCOPY N/A 05/26/2016   Procedure: COLONOSCOPY;  Surgeon: Daneil Dolin, MD;  Location: [facility];  Service: Endoscopy;  Laterality: N/A;  845   ESOPHAGOGASTRODUODENOSCOPY N/A 05/26/2016   Procedure: ESOPHAGOGASTRODUODENOSCOPY (EGD);  Surgeon: Daneil Dolin, MD;  Location: [facility];  Service: Endoscopy;  Laterality: N/A;   EYE SURGERY     MALONEY DILATION N/A 05/26/2016   Procedure: Venia Minks DILATION;  Surgeon: Daneil Dolin, MD;  Location: [facility];  Service: Endoscopy;  Laterality: N/A;   TOOTH EXTRACTION N/A 01/15/2022   Procedure: DENTAL RESTORATION/EXTRACTIONS;  Surgeon: Diona Browner, DMD;  Location: [facility];  Service: Oral Surgery;  Laterality: N/A;   VITRECTOMY Left 10/03/2019   Dr. Zadie Rhine, Vitrectomy, Focal Laser, Removal of Silicone Oil     Allergies  Allergen Reactions   Sulfa Antibiotics     Unknown reaction       Family History  Problem Relation Age of Onset   Heart disease Mother    COPD Father    Cancer Sister        unknown primary   Cancer Brother        unknown primary   Cancer Brother    Hypertension Son    Colon cancer Neg Hx      Social History Ms. Romulus reports that she quit smoking about 7 years ago. Her smoking use included cigarettes. She has never used smokeless tobacco. Ms. Komara reports no history of alcohol use.   Review of Systems CONSTITUTIONAL: No weight loss, fever, chills, weakness or fatigue.  HEENT: Eyes: No visual loss, blurred vision, double vision or yellow sclerae.No hearing loss, sneezing, congestion, runny nose or sore throat.  SKIN: No rash or itching.  CARDIOVASCULAR: per hpi RESPIRATORY: No shortness of breath, cough or sputum.  GASTROINTESTINAL: No anorexia, nausea, vomiting or diarrhea. No abdominal pain or blood.  GENITOURINARY: No burning on  urination, no polyuria NEUROLOGICAL: No headache, dizziness, syncope, paralysis, ataxia, numbness or tingling in the extremities. No change in bowel or bladder control.  MUSCULOSKELETAL: No muscle, back pain, joint pain or stiffness.  LYMPHATICS: No enlarged nodes. No history of splenectomy.  PSYCHIATRIC: No history of depression or anxiety.  ENDOCRINOLOGIC: No reports of sweating, cold or heat intolerance. No polyuria or polydipsia.  Marland Kitchen   Physical Examination Today's Vitals   05/20/22 1034  BP: 100/60  Pulse: (!) 51  Weight: 141 lb 12.8 oz (64.3 kg)  Height: 5' 7"$  (1.702 m)   Body mass index is 22.21 kg/m.  Gen: resting comfortably, no acute distress HEENT: no scleral icterus, pupils equal round and reactive, no palptable cervical adenopathy,  CV: RRR, no m/rg, no jvd Resp: Clear to auscultation bilaterally GI: abdomen is soft, non-tender, non-distended, normal bowel sounds, no hepatosplenomegaly MSK: extremities are warm, no edema.  Skin: warm, no rash Neuro:  no focal deficits Psych: appropriate affect   Diagnostic Studies  11/2013 Echo Study Conclusions  - Left ventricle: The cavity size was normal. Wall thickness was at the upper limits of normal. Systolic function was vigorous. The estimated ejection fraction was in the range of 65% to 70%. Wall motion was normal; there were no regional wall motion abnormalities. Doppler parameters are consistent with abnormal left ventricular relaxation (grade 1 diastolic dysfunction). Doppler parameters are consistent with elevated ventricular end-diastolic filling pressure. - Mitral valve: Calcified annulus. There was mild regurgitation. - Right atrium: Central venous pressure (est): 3 mm Hg. - Atrial septum: No defect or patent foramen ovale was identified. - Tricuspid valve: There was trivial regurgitation. - Pulmonary arteries: PA peak pressure: 28 mm Hg (S). - Pericardium, extracardiac: There was no pericardial  effusion.  Impressions:  - Upper normal LV wall thickness with LVEF Q000111Q, grade 1 diastolic dysfunction with increased filling pressures. Mild mitral regurgitation. Trivial tricuspid regurgitation with normal PASP 28 mmHg.   10/2015 Echo Study Conclusions   - Left ventricle: Systolic function was normal. The estimated   ejection fraction  was in the range of 55% to 60%. Wall motion was   normal; there were no regional wall motion abnormalities. 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 - Mitral valve: Calcified annulus. There was mild to moderate   regurgitation directed centrally. Valve area by pressure   half-time: 2.34 cm^2. - Pulmonary arteries: Systolic pressure was mildly increased. PA   peak pressure: 41 mm Hg (S).   03/2016 cath The left ventricular ejection fraction is 55-65% by visual estimate. The left ventricular systolic function is normal. A STENT RESOLUTE ONYX 2.75X12 drug eluting stent was successfully placed, and does not overlap previously placed stent. Prox Cx to Mid Cx lesion, 99 %stenosed. Post intervention, there is a 0% residual stenosis.   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 Otherwise widely patent coronary arteries Successful angioplasty and stenting of the proximal circumflex reducing a greater than 95% stenosis to 0% with TIMI grade 3 flow. Resolute Onyx 2.75 x 12 mm stent deployed at 14 atm.   Recommendations:   Aspirin, Brilinta, and Eliquis x 1 month then drop aspirin. See further instruction below for 3 months and beyond. Eliquis should start in AM as long as no recurrent AF/Afl. Would also consider switch to Plavix at 3 months to decrease the risk of bleeding on Eliquis.   06/2021 echo 1. Left ventricular ejection fraction, by estimation, is 60 to 65%. The  left ventricle has normal function. The left ventricle has no regional  wall motion abnormalities. There is mild left  ventricular hypertrophy.  Left ventricular diastolic parameters  are indeterminate. Elevated left atrial pressure.   2. Right ventricular systolic function is normal. The right ventricular  size is normal. There is mildly elevated pulmonary artery systolic  pressure.   3. The mitral valve is abnormal. Mild mitral valve regurgitation.  Moderate mitral stenosis. The mean mitral valve gradient is 6.0 mmHg.   4. The tricuspid valve is abnormal.   5. The aortic valve is tricuspid. There is mild calcification of the  aortic valve. There is mild thickening of the aortic valve. Aortic valve  regurgitation is not visualized. No aortic stenosis is present.   6. The inferior vena cava is normal in size with greater than 50%  respiratory variability, suggesting right atrial pressure of 3 mmHg.      Assessment and Plan   1. CAD - no recent symptoms, continue current meds   2. Aflutter/acquired thrombophilia - no recent symptoms, continue current meds including eliquis for stroke prevention.     3. HTN - at goal, continue current meds  4. Preoperative evaluation - consider vitrectomy under MAC. Low risk procedure, no additional cardiac testing indicated. Can hold eliquis 2-3 days prior pending surgeon preference, restart eliquis day after    F/u 6 months     Arnoldo Lenis, M.D.

## 2022-05-26 DIAGNOSIS — Z882 Allergy status to sulfonamides status: Secondary | ICD-10-CM | POA: Diagnosis not present

## 2022-05-26 DIAGNOSIS — H33001 Unspecified retinal detachment with retinal break, right eye: Secondary | ICD-10-CM | POA: Diagnosis not present

## 2022-05-26 DIAGNOSIS — H35341 Macular cyst, hole, or pseudohole, right eye: Secondary | ICD-10-CM | POA: Diagnosis not present

## 2022-05-26 DIAGNOSIS — Z79899 Other long term (current) drug therapy: Secondary | ICD-10-CM | POA: Diagnosis not present

## 2022-05-26 DIAGNOSIS — H3321 Serous retinal detachment, right eye: Secondary | ICD-10-CM | POA: Diagnosis not present

## 2022-05-26 DIAGNOSIS — I129 Hypertensive chronic kidney disease with stage 1 through stage 4 chronic kidney disease, or unspecified chronic kidney disease: Secondary | ICD-10-CM | POA: Diagnosis not present

## 2022-05-26 DIAGNOSIS — I252 Old myocardial infarction: Secondary | ICD-10-CM | POA: Diagnosis not present

## 2022-05-26 DIAGNOSIS — I4891 Unspecified atrial fibrillation: Secondary | ICD-10-CM | POA: Diagnosis not present

## 2022-05-26 DIAGNOSIS — E039 Hypothyroidism, unspecified: Secondary | ICD-10-CM | POA: Diagnosis not present

## 2022-05-26 DIAGNOSIS — H35051 Retinal neovascularization, unspecified, right eye: Secondary | ICD-10-CM | POA: Diagnosis not present

## 2022-05-26 DIAGNOSIS — Z955 Presence of coronary angioplasty implant and graft: Secondary | ICD-10-CM | POA: Diagnosis not present

## 2022-05-26 DIAGNOSIS — Z7901 Long term (current) use of anticoagulants: Secondary | ICD-10-CM | POA: Diagnosis not present

## 2022-05-26 DIAGNOSIS — H3561 Retinal hemorrhage, right eye: Secondary | ICD-10-CM | POA: Diagnosis not present

## 2022-05-26 DIAGNOSIS — H4311 Vitreous hemorrhage, right eye: Secondary | ICD-10-CM | POA: Diagnosis not present

## 2022-05-26 DIAGNOSIS — I2510 Atherosclerotic heart disease of native coronary artery without angina pectoris: Secondary | ICD-10-CM | POA: Diagnosis not present

## 2022-05-26 DIAGNOSIS — E785 Hyperlipidemia, unspecified: Secondary | ICD-10-CM | POA: Diagnosis not present

## 2022-05-26 DIAGNOSIS — N1832 Chronic kidney disease, stage 3b: Secondary | ICD-10-CM | POA: Diagnosis not present

## 2022-05-26 DIAGNOSIS — Z7951 Long term (current) use of inhaled steroids: Secondary | ICD-10-CM | POA: Diagnosis not present

## 2022-05-26 DIAGNOSIS — Z87891 Personal history of nicotine dependence: Secondary | ICD-10-CM | POA: Diagnosis not present

## 2022-05-26 DIAGNOSIS — J449 Chronic obstructive pulmonary disease, unspecified: Secondary | ICD-10-CM | POA: Diagnosis not present

## 2022-06-02 DIAGNOSIS — M545 Low back pain, unspecified: Secondary | ICD-10-CM | POA: Diagnosis not present

## 2022-06-02 DIAGNOSIS — Z87440 Personal history of urinary (tract) infections: Secondary | ICD-10-CM | POA: Diagnosis not present

## 2022-06-03 ENCOUNTER — Encounter: Payer: Self-pay | Admitting: Radiology

## 2022-06-03 DIAGNOSIS — S52502A Unspecified fracture of the lower end of left radius, initial encounter for closed fracture: Secondary | ICD-10-CM | POA: Diagnosis not present

## 2022-06-03 DIAGNOSIS — M25532 Pain in left wrist: Secondary | ICD-10-CM | POA: Diagnosis not present

## 2022-06-04 ENCOUNTER — Ambulatory Visit (HOSPITAL_COMMUNITY): Admission: RE | Admit: 2022-06-04 | Payer: 59 | Source: Ambulatory Visit

## 2022-07-15 DIAGNOSIS — H33001 Unspecified retinal detachment with retinal break, right eye: Secondary | ICD-10-CM | POA: Diagnosis not present

## 2022-07-15 DIAGNOSIS — S52502D Unspecified fracture of the lower end of left radius, subsequent encounter for closed fracture with routine healing: Secondary | ICD-10-CM | POA: Diagnosis not present

## 2022-07-26 ENCOUNTER — Other Ambulatory Visit: Payer: Self-pay

## 2022-07-26 ENCOUNTER — Observation Stay (HOSPITAL_COMMUNITY): Payer: 59

## 2022-07-26 ENCOUNTER — Emergency Department (HOSPITAL_COMMUNITY): Payer: 59

## 2022-07-26 ENCOUNTER — Inpatient Hospital Stay (HOSPITAL_COMMUNITY)
Admission: EM | Admit: 2022-07-26 | Discharge: 2022-07-29 | DRG: 871 | Disposition: A | Discharge status: Home Health | Payer: 59 | Attending: Family Medicine | Admitting: Family Medicine

## 2022-07-26 ENCOUNTER — Encounter (HOSPITAL_COMMUNITY): Payer: Self-pay | Admitting: Emergency Medicine

## 2022-07-26 DIAGNOSIS — R410 Disorientation, unspecified: Secondary | ICD-10-CM | POA: Diagnosis not present

## 2022-07-26 DIAGNOSIS — Z87891 Personal history of nicotine dependence: Secondary | ICD-10-CM | POA: Diagnosis not present

## 2022-07-26 DIAGNOSIS — J440 Chronic obstructive pulmonary disease with acute lower respiratory infection: Secondary | ICD-10-CM | POA: Diagnosis not present

## 2022-07-26 DIAGNOSIS — I252 Old myocardial infarction: Secondary | ICD-10-CM | POA: Diagnosis not present

## 2022-07-26 DIAGNOSIS — G8929 Other chronic pain: Secondary | ICD-10-CM | POA: Diagnosis present

## 2022-07-26 DIAGNOSIS — N184 Chronic kidney disease, stage 4 (severe): Secondary | ICD-10-CM | POA: Diagnosis present

## 2022-07-26 DIAGNOSIS — E785 Hyperlipidemia, unspecified: Secondary | ICD-10-CM | POA: Diagnosis not present

## 2022-07-26 DIAGNOSIS — Z955 Presence of coronary angioplasty implant and graft: Secondary | ICD-10-CM | POA: Diagnosis not present

## 2022-07-26 DIAGNOSIS — I2510 Atherosclerotic heart disease of native coronary artery without angina pectoris: Secondary | ICD-10-CM | POA: Diagnosis present

## 2022-07-26 DIAGNOSIS — Z8249 Family history of ischemic heart disease and other diseases of the circulatory system: Secondary | ICD-10-CM

## 2022-07-26 DIAGNOSIS — A419 Sepsis, unspecified organism: Secondary | ICD-10-CM | POA: Diagnosis not present

## 2022-07-26 DIAGNOSIS — I4892 Unspecified atrial flutter: Secondary | ICD-10-CM | POA: Diagnosis not present

## 2022-07-26 DIAGNOSIS — M546 Pain in thoracic spine: Secondary | ICD-10-CM | POA: Diagnosis not present

## 2022-07-26 DIAGNOSIS — E872 Acidosis, unspecified: Secondary | ICD-10-CM | POA: Diagnosis present

## 2022-07-26 DIAGNOSIS — I129 Hypertensive chronic kidney disease with stage 1 through stage 4 chronic kidney disease, or unspecified chronic kidney disease: Secondary | ICD-10-CM | POA: Diagnosis present

## 2022-07-26 DIAGNOSIS — R059 Cough, unspecified: Secondary | ICD-10-CM | POA: Diagnosis not present

## 2022-07-26 DIAGNOSIS — Z882 Allergy status to sulfonamides status: Secondary | ICD-10-CM

## 2022-07-26 DIAGNOSIS — Z1152 Encounter for screening for COVID-19: Secondary | ICD-10-CM | POA: Diagnosis not present

## 2022-07-26 DIAGNOSIS — Z7901 Long term (current) use of anticoagulants: Secondary | ICD-10-CM

## 2022-07-26 DIAGNOSIS — I4891 Unspecified atrial fibrillation: Secondary | ICD-10-CM | POA: Diagnosis present

## 2022-07-26 DIAGNOSIS — R200 Anesthesia of skin: Secondary | ICD-10-CM | POA: Diagnosis present

## 2022-07-26 DIAGNOSIS — R0602 Shortness of breath: Secondary | ICD-10-CM | POA: Diagnosis not present

## 2022-07-26 DIAGNOSIS — E039 Hypothyroidism, unspecified: Secondary | ICD-10-CM | POA: Diagnosis present

## 2022-07-26 DIAGNOSIS — Z7951 Long term (current) use of inhaled steroids: Secondary | ICD-10-CM

## 2022-07-26 DIAGNOSIS — J189 Pneumonia, unspecified organism: Secondary | ICD-10-CM | POA: Diagnosis present

## 2022-07-26 DIAGNOSIS — Z79899 Other long term (current) drug therapy: Secondary | ICD-10-CM

## 2022-07-26 DIAGNOSIS — E861 Hypovolemia: Secondary | ICD-10-CM | POA: Diagnosis not present

## 2022-07-26 DIAGNOSIS — Z825 Family history of asthma and other chronic lower respiratory diseases: Secondary | ICD-10-CM

## 2022-07-26 DIAGNOSIS — R0609 Other forms of dyspnea: Secondary | ICD-10-CM | POA: Diagnosis not present

## 2022-07-26 DIAGNOSIS — D509 Iron deficiency anemia, unspecified: Secondary | ICD-10-CM | POA: Diagnosis not present

## 2022-07-26 DIAGNOSIS — E871 Hypo-osmolality and hyponatremia: Secondary | ICD-10-CM

## 2022-07-26 DIAGNOSIS — J449 Chronic obstructive pulmonary disease, unspecified: Secondary | ICD-10-CM | POA: Diagnosis present

## 2022-07-26 DIAGNOSIS — I10 Essential (primary) hypertension: Secondary | ICD-10-CM | POA: Diagnosis present

## 2022-07-26 DIAGNOSIS — E782 Mixed hyperlipidemia: Secondary | ICD-10-CM | POA: Diagnosis present

## 2022-07-26 DIAGNOSIS — Z79890 Hormone replacement therapy (postmenopausal): Secondary | ICD-10-CM

## 2022-07-26 DIAGNOSIS — Z853 Personal history of malignant neoplasm of breast: Secondary | ICD-10-CM

## 2022-07-26 LAB — COMPREHENSIVE METABOLIC PANEL
ALT: 14 U/L (ref 0–44)
AST: 32 U/L (ref 15–41)
Albumin: 3.6 g/dL (ref 3.5–5.0)
Alkaline Phosphatase: 69 U/L (ref 38–126)
Anion gap: 10 (ref 5–15)
BUN: 28 mg/dL — ABNORMAL HIGH (ref 8–23)
CO2: 18 mmol/L — ABNORMAL LOW (ref 22–32)
Calcium: 9.1 mg/dL (ref 8.9–10.3)
Chloride: 104 mmol/L (ref 98–111)
Creatinine, Ser: 1.83 mg/dL — ABNORMAL HIGH (ref 0.44–1.00)
GFR, Estimated: 28 mL/min — ABNORMAL LOW (ref >60)
Glucose, Bld: 126 mg/dL — ABNORMAL HIGH (ref 70–99)
Potassium: 4.2 mmol/L (ref 3.5–5.1)
Sodium: 132 mmol/L — ABNORMAL LOW (ref 135–145)
Total Bilirubin: 0.7 mg/dL (ref 0.3–1.2)
Total Protein: 6.7 g/dL (ref 6.5–8.1)

## 2022-07-26 LAB — CBC WITH DIFFERENTIAL/PLATELET
Abs Immature Granulocytes: 1.17 10*3/uL — ABNORMAL HIGH (ref 0.00–0.07)
Basophils Absolute: 0 10*3/uL (ref 0.0–0.1)
Basophils Relative: 0 %
Eosinophils Absolute: 0 10*3/uL (ref 0.0–0.5)
Eosinophils Relative: 0 %
HCT: 34.8 % — ABNORMAL LOW (ref 36.0–46.0)
Hemoglobin: 11.2 g/dL — ABNORMAL LOW (ref 12.0–15.0)
Immature Granulocytes: 5 %
Lymphocytes Relative: 2 %
Lymphs Abs: 0.4 10*3/uL — ABNORMAL LOW (ref 0.7–4.0)
MCH: 30.6 pg (ref 26.0–34.0)
MCHC: 32.2 g/dL (ref 30.0–36.0)
MCV: 95.1 fL (ref 80.0–100.0)
Monocytes Absolute: 1.2 10*3/uL — ABNORMAL HIGH (ref 0.1–1.0)
Monocytes Relative: 5 %
Neutro Abs: 21.7 10*3/uL — ABNORMAL HIGH (ref 1.7–7.7)
Neutrophils Relative %: 88 %
Platelets: 111 10*3/uL — ABNORMAL LOW (ref 150–400)
RBC: 3.66 MIL/uL — ABNORMAL LOW (ref 3.87–5.11)
RDW: 13.7 % (ref 11.5–15.5)
WBC: 24.4 10*3/uL — ABNORMAL HIGH (ref 4.0–10.5)
nRBC: 0 % (ref 0.0–0.2)

## 2022-07-26 LAB — PROCALCITONIN: Procalcitonin: 15.74 ng/mL

## 2022-07-26 LAB — BLOOD GAS, VENOUS
Acid-base deficit: 6.5 mmol/L — ABNORMAL HIGH (ref 0.0–2.0)
Bicarbonate: 17.2 mmol/L — ABNORMAL LOW (ref 20.0–28.0)
Drawn by: 4237
O2 Saturation: 46.9 %
Patient temperature: 36.6
pCO2, Ven: 28 mmHg — ABNORMAL LOW (ref 44–60)
pH, Ven: 7.39 (ref 7.25–7.43)
pO2, Ven: <31 mmHg — CL (ref 32–45)

## 2022-07-26 LAB — TROPONIN I (HIGH SENSITIVITY)
Troponin I (High Sensitivity): 5 ng/L (ref <18)
Troponin I (High Sensitivity): 5 ng/L (ref <18)

## 2022-07-26 LAB — PROTIME-INR
INR: 1.5 — ABNORMAL HIGH (ref 0.8–1.2)
Prothrombin Time: 17.6 seconds — ABNORMAL HIGH (ref 11.4–15.2)

## 2022-07-26 LAB — CULTURE, BLOOD (ROUTINE X 2)

## 2022-07-26 LAB — RESP PANEL BY RT-PCR (RSV, FLU A&B, COVID)  RVPGX2
Influenza A by PCR: NEGATIVE
Influenza B by PCR: NEGATIVE
Resp Syncytial Virus by PCR: NEGATIVE
SARS Coronavirus 2 by RT PCR: NEGATIVE

## 2022-07-26 LAB — VITAMIN B12: Vitamin B-12: 521 pg/mL (ref 180–914)

## 2022-07-26 LAB — LACTIC ACID, PLASMA
Lactic Acid, Venous: 2 mmol/L (ref 0.5–1.9)
Lactic Acid, Venous: 1.2 mmol/L (ref 0.5–1.9)

## 2022-07-26 LAB — TSH: TSH: 1.2 u[IU]/mL (ref 0.350–4.500)

## 2022-07-26 LAB — BRAIN NATRIURETIC PEPTIDE: B Natriuretic Peptide: 497 pg/mL — ABNORMAL HIGH (ref 0.0–100.0)

## 2022-07-26 LAB — MAGNESIUM: Magnesium: 1.6 mg/dL — ABNORMAL LOW (ref 1.7–2.4)

## 2022-07-26 LAB — AMMONIA: Ammonia: <10 umol/L (ref 9–35)

## 2022-07-26 MED ORDER — FLUTICASONE PROPIONATE 50 MCG/ACT NA SUSP
2.0000 | Freq: Every day | NASAL | Status: DC
  Administered 2022-07-26 – 2022-07-29 (×4): 2 via NASAL
  Filled 2022-07-26: qty 16

## 2022-07-26 MED ORDER — ALBUTEROL SULFATE HFA 108 (90 BASE) MCG/ACT IN AERS
2.0000 | INHALATION_SPRAY | Freq: Once | RESPIRATORY_TRACT | Status: AC
  Administered 2022-07-26: 2 via RESPIRATORY_TRACT
  Filled 2022-07-26: qty 6.7

## 2022-07-26 MED ORDER — SODIUM CHLORIDE 0.9 % IV SOLN
500.0000 mg | Freq: Once | INTRAVENOUS | Status: AC
  Administered 2022-07-26: 500 mg via INTRAVENOUS
  Filled 2022-07-26: qty 5

## 2022-07-26 MED ORDER — LEVOTHYROXINE SODIUM 50 MCG PO TABS
50.0000 ug | ORAL_TABLET | Freq: Every day | ORAL | Status: DC
  Administered 2022-07-27 – 2022-07-29 (×3): 50 ug via ORAL
  Filled 2022-07-26 (×3): qty 1

## 2022-07-26 MED ORDER — SODIUM CHLORIDE 0.9 % IV BOLUS
500.0000 mL | Freq: Once | INTRAVENOUS | Status: AC
  Administered 2022-07-26: 500 mL via INTRAVENOUS

## 2022-07-26 MED ORDER — CYCLOBENZAPRINE HCL 10 MG PO TABS
10.0000 mg | ORAL_TABLET | Freq: Once | ORAL | Status: AC
  Administered 2022-07-26: 10 mg via ORAL
  Filled 2022-07-26: qty 1

## 2022-07-26 MED ORDER — ONDANSETRON HCL 4 MG/2ML IJ SOLN: 4.0000 mg | Freq: Four times a day (QID) | INTRAMUSCULAR | Status: DC | PRN

## 2022-07-26 MED ORDER — SODIUM CHLORIDE 0.9 % IV SOLN
1.0000 g | Freq: Once | INTRAVENOUS | Status: AC
  Administered 2022-07-26: 1 g via INTRAVENOUS
  Filled 2022-07-26: qty 10

## 2022-07-26 MED ORDER — ACETAMINOPHEN 650 MG RE SUPP: 650.0000 mg | Freq: Four times a day (QID) | RECTAL | Status: DC | PRN

## 2022-07-26 MED ORDER — MAGNESIUM SULFATE 2 GM/50ML IV SOLN
2.0000 g | Freq: Once | INTRAVENOUS | Status: AC
  Administered 2022-07-26: 2 g via INTRAVENOUS
  Filled 2022-07-26: qty 50

## 2022-07-26 MED ORDER — SODIUM CHLORIDE 0.9 % IV SOLN
1.0000 g | INTRAVENOUS | Status: DC
  Administered 2022-07-27 – 2022-07-29 (×3): 1 g via INTRAVENOUS
  Filled 2022-07-26 (×3): qty 10

## 2022-07-26 MED ORDER — ACETAMINOPHEN 325 MG PO TABS
650.0000 mg | ORAL_TABLET | Freq: Four times a day (QID) | ORAL | Status: DC | PRN
  Administered 2022-07-28 – 2022-07-29 (×2): 650 mg via ORAL
  Filled 2022-07-26 (×2): qty 2

## 2022-07-26 MED ORDER — ONDANSETRON HCL 4 MG PO TABS: 4.0000 mg | ORAL_TABLET | Freq: Four times a day (QID) | ORAL | Status: DC | PRN

## 2022-07-26 MED ORDER — UMECLIDINIUM BROMIDE 62.5 MCG/ACT IN AEPB
1.0000 | INHALATION_SPRAY | Freq: Every day | RESPIRATORY_TRACT | Status: DC
  Administered 2022-07-27 – 2022-07-29 (×3): 1 via RESPIRATORY_TRACT
  Filled 2022-07-26: qty 7

## 2022-07-26 MED ORDER — FLUTICASONE FUROATE-VILANTEROL 100-25 MCG/ACT IN AEPB
1.0000 | INHALATION_SPRAY | Freq: Every day | RESPIRATORY_TRACT | Status: DC
  Administered 2022-07-27 – 2022-07-29 (×3): 1 via RESPIRATORY_TRACT
  Filled 2022-07-26: qty 28

## 2022-07-26 MED ORDER — METHYLPREDNISOLONE SODIUM SUCC 125 MG IJ SOLR
125.0000 mg | Freq: Once | INTRAMUSCULAR | Status: AC
  Administered 2022-07-26: 125 mg via INTRAVENOUS
  Filled 2022-07-26: qty 2

## 2022-07-26 MED ORDER — SODIUM CHLORIDE 0.9 % IV SOLN
500.0000 mg | INTRAVENOUS | Status: DC
  Administered 2022-07-27 – 2022-07-29 (×3): 500 mg via INTRAVENOUS
  Filled 2022-07-26 (×3): qty 5

## 2022-07-26 MED ORDER — LIDOCAINE 5 % EX PTCH
1.0000 | MEDICATED_PATCH | CUTANEOUS | Status: DC
  Administered 2022-07-26 – 2022-07-29 (×4): 1 via TRANSDERMAL
  Filled 2022-07-26 (×4): qty 1

## 2022-07-26 MED ORDER — SODIUM CHLORIDE 0.9 % IV SOLN: INTRAVENOUS | Status: AC

## 2022-07-26 MED ORDER — ALBUTEROL SULFATE (2.5 MG/3ML) 0.083% IN NEBU: 2.5000 mg | INHALATION_SOLUTION | Freq: Four times a day (QID) | RESPIRATORY_TRACT | Status: DC | PRN

## 2022-07-26 MED ORDER — APIXABAN 5 MG PO TABS
5.0000 mg | ORAL_TABLET | Freq: Two times a day (BID) | ORAL | Status: DC
  Administered 2022-07-27 – 2022-07-29 (×6): 5 mg via ORAL
  Filled 2022-07-26 (×6): qty 1

## 2022-07-26 MED ORDER — IPRATROPIUM-ALBUTEROL 0.5-2.5 (3) MG/3ML IN SOLN
3.0000 mL | Freq: Four times a day (QID) | RESPIRATORY_TRACT | Status: AC
  Administered 2022-07-26 – 2022-07-27 (×2): 3 mL via RESPIRATORY_TRACT
  Filled 2022-07-26 (×3): qty 3

## 2022-07-26 NOTE — Assessment & Plan Note (Signed)
Baseline 11-12, stable Continue to monitor

## 2022-07-26 NOTE — Assessment & Plan Note (Addendum)
Well controlled-soft Hold bp medication with soft bp

## 2022-07-26 NOTE — H&P (Signed)
History and Physical    Patient: Patricia Jackson BMW:[id number] DOB: 1941-01-21 DOA: 07/26/2022 DOS: the patient was seen and examined on 07/26/2022 PCP: Benita Stabile, MD  Patient coming from: Home - lives with grandson and family    Chief Complaint: shortness of breath/ cough and weakness   HPI: Patricia Jackson is a 82 y.o. female with medical history significant of atrial flutter on eliquis, remote hx of breast cancer, CAD, COPD, HTN, IDA, hypothyroidism who presented to ED with complaints of shortness of breath, cough and weakness/fatigue x2 days with worsening symptoms that prompted her to come to hospital.  She states about 2 days again she had shortness of breath, productive cough with increased sputum, N/V. She denies any wheezing, sick contacts or fevers.  She has had poor PO intake over the past 2 days and increased weakness/dyspnea on exertion to the point she had to stop and rest after just a few steps and was having a hard time just getting up. She denies any swelling in her lower legs or weight gain. She has some chronic back pain in her right upper back that seems worse than normal, the lidocaine patch has helped.    Denies any fever/chills, vision changes/headaches, chest pain or palpitations, abdominal pain or diarrhea, dysuria or leg swelling.   She does not smoke or drink alcohol.    ER Course:  vitals: afebrile, bp: 123/55, HR: 89, RR: 20, oxygen: 96%RA Pertinent labs: BUN: 28, creatinine: 1.83, sodium: 132, wbc: 24.4, hgb: 11.2,  CXR: streaky opacity at the infrahilar medial right base may be atelectasis or pneumonia.  In ED: given albuterol, azithromycin, rocephin, steroids and TRH asked to admit.    Review of Systems: As mentioned in the history of present illness. All other systems reviewed and are negative. Past Medical History:  Diagnosis Date   A-fib    Atrial flutter    On Eliquis in 8/17 but discontinued after stent placement   Breast cancer    remote   CAD  in native artery 03/16/2016   COPD (chronic obstructive pulmonary disease)    Dysrhythmia    Hypertension    Iron deficiency anemia 06/12/2021   NSTEMI (non-ST elevated myocardial infarction)    2017   S/P angioplasty with stent 03/15/16 DES Resolute, pLCX 03/16/2016   Vitreous hemorrhage of left eye 08/01/2019   Past Surgical History:  Procedure Laterality Date   APPENDECTOMY     BREAST SURGERY Left    CARDIAC CATHETERIZATION N/A 03/15/2016   Procedure: Left Heart Cath and Coronary Angiography;  Surgeon: Lyn Records, MD;  Location: [facility] INVASIVE CV LAB;  Service: Cardiovascular;  Laterality: N/A;   CARDIAC CATHETERIZATION N/A 03/15/2016   Procedure: Coronary Stent Intervention;  Surgeon: Lyn Records, MD;  Location: [facility] INVASIVE CV LAB;  Service: Cardiovascular;  Laterality: N/A;   COLONOSCOPY     remote   COLONOSCOPY N/A 05/26/2016   Procedure: COLONOSCOPY;  Surgeon: Corbin Ade, MD;  Location: [facility] ENDO SUITE;  Service: Endoscopy;  Laterality: N/A;  845   ESOPHAGOGASTRODUODENOSCOPY N/A 05/26/2016   Procedure: ESOPHAGOGASTRODUODENOSCOPY (EGD);  Surgeon: Corbin Ade, MD;  Location: [facility] ENDO SUITE;  Service: Endoscopy;  Laterality: N/A;   EYE SURGERY     MALONEY DILATION N/A 05/26/2016   Procedure: Elease Hashimoto DILATION;  Surgeon: Corbin Ade, MD;  Location: [facility] END[facility] SUITE;  Service: Endoscopy;  Laterality: N/A;   TOOTH EXTRACTION N/A 01/15/2022   Procedure: DENTAL RESTORATION/EXTRACTIONS;  Surgeon: Ocie Doyne, DMD;  Location:  [facility] OR;  Service: Oral Surgery;  Laterality: N/A;   VITRECTOMY Left 10/03/2019   Dr. Luciana Axe, Vitrectomy, Focal Laser, Removal of Silicone Oil   Social History:  reports that she quit smoking about 7 years ago. Her smoking use included cigarettes. She has never used smokeless tobacco. She reports that she does not drink alcohol and does not use drugs.  Allergies  Allergen Reactions   Sulfa Antibiotics     Unknown reaction     Family History  Problem  Relation Age of Onset   Heart disease Mother    COPD Father    Cancer Sister        unknown primary   Cancer Brother        unknown primary   Cancer Brother    Hypertension Son    Colon cancer Neg Hx     Prior to Admission medications   Medication Sig Start Date End Date Taking? Authorizing Provider  albuterol (PROVENTIL) (2.5 MG/3ML) 0.083% nebulizer solution Take 2.5 mg by nebulization every 6 (six) hours as needed for wheezing or shortness of breath.    [provider]  albuterol (VENTOLIN HFA) 108 (90 Base) MCG/ACT inhaler Inhale 1-2 puffs into the lungs every 6 (six) hours as needed for wheezing or shortness of breath. 03/02/22   Particia Nearing, PA-C  amLODipine-olmesartan (AZOR) 10-40 MG tablet Take 1 tablet by mouth every morning. 06/23/21   Rodolph Bong, MD  amoxicillin (AMOXIL) 500 MG capsule Take 1 capsule (500 mg total) by mouth 2 (two) times daily. Patient not taking: Reported on 04/26/2022 01/15/22   Ocie Doyne, DMD  apixaban (ELIQUIS) 5 MG TABS tablet TAKE ONE TABLET (  TOTAL) BY MOUTH TWOTIMES DAILY 03/01/22   Quintella Reichert, MD  azithromycin (ZITHROMAX) 250 MG tablet Take first 2 tablets together, then 1 every day until finished. Patient not taking: Reported on 04/26/2022 03/02/22   Particia Nearing, PA-C  buPROPion (WELLBUTRIN XL) 300 MG 24 hr tablet Take 300 mg by mouth daily. 11/05/19   [provider]  cephALEXin (KEFLEX) 500 MG capsule Take 1 capsule (500 mg total) by mouth 3 (three) times daily. Patient not taking: Reported on 05/20/2022 05/09/22   Pauline Aus, PA-C  Cholecalciferol (VITAMIN D3) 50 MCG (2000 UT) TABS Take 2,000 Units by mouth daily.    [provider]  Copper Gluconate (COPPER CAPS PO) Take 6 mg by mouth daily.    [provider]  cyanocobalamin (VITAMIN B12) 1000 MCG tablet Take 1,000 mcg by mouth daily.    [provider]  denosumab (PROLIA) 60 MG/ML SOSY injection Inject 60 mg into  the skin every 6 (six) months.    [provider]  fluticasone (FLONASE) 50 MCG/ACT nasal spray Place 2 sprays into both nostrils daily. 06/23/21   Rodolph Bong, MD  guaiFENesin (MUCINEX) 600 MG 12 hr tablet Take 1 tablet (600 mg total) by mouth 2 (two) times daily as needed. Patient not taking: Reported on 04/26/2022 03/02/22   Particia Nearing, PA-C  ipratropium-albuterol (DUONEB) 0.5-2.5 (3) MG/3ML SOLN Take 3 mLs by nebulization every 4 (four) hours as needed. 03/02/22   Particia Nearing, PA-C  levothyroxine (SYNTHROID) 50 MCG tablet Take 50 mcg by mouth daily before breakfast.  09/20/19   [provider]  loratadine (CLARITIN) 10 MG tablet Take 1 tablet (10 mg total) by mouth daily. Patient not taking: Reported on 01/13/2022 06/23/21   Rodolph Bong, MD  metoprolol succinate (TOPROL XL) 25  MG 24 hr tablet Take 1 tablet (25 mg total) by mouth daily. 09/02/21   Quintella Reichert, MD  nitroGLYCERIN (NITROSTAT) 0.4 MG SL tablet Place 0.4 mg under the tongue every 5 (five) minutes as needed for chest pain. 06/09/19   [provider]  oxyCODONE-acetaminophen (PERCOCET/ROXICET) 5-325 MG tablet Take 1 tablet by mouth every 6 (six) hours as needed for severe pain. Patient not taking: Reported on 05/20/2022 05/09/22   Pauline Aus, PA-C  oxyCODONE-acetaminophen (PERCOCET/ROXICET) 5-325 MG tablet Take 1 tablet by mouth every 4 (four) hours as needed. Patient not taking: Reported on 05/20/2022 05/09/22   Pauline Aus, PA-C  predniSONE (DELTASONE) 20 MG tablet Take 2 tablets (40 mg total) by mouth daily with breakfast. Patient not taking: Reported on 04/26/2022 03/02/22   Particia Nearing, PA-C  promethazine-dextromethorphan (PROMETHAZINE-DM) 6.25-15 MG/5ML syrup Take 5 mLs by mouth 4 (four) times daily as needed. Patient not taking: Reported on 04/26/2022 03/02/22   Particia Nearing, PA-C  rosuvastatin (CRESTOR) 20 MG tablet Take 20 mg by mouth daily.   02/26/17   [provider]  sertraline (ZOLOFT) 25 MG tablet Take 25 mg by mouth at bedtime.    [provider]  TRELEGY ELLIPTA 100-62.5-25 MCG/ACT AEPB Inhale 1 puff into the lungs daily. 06/13/21   [provider]    Physical Exam: Vitals:   07/26/22 1813 07/26/22 1843 07/26/22 1950 07/26/22 2000  BP: (!) 95/45  (!) 101/52   Pulse: 87  75   Resp: 18  18   Temp: 97.7 F (36.5 C)     TempSrc: Oral     SpO2: 95%  95% 95%  Weight:  61.9 kg    Height:  5\' 7"  (1.702 m)     General:  Appears calm and comfortable and is in NAD Eyes:  PERRL, EOMI, normal lids, iris ENT:  grossly normal hearing, lips & tongue, dry mucous membranes. poor dentition Neck:  no LAD, masses or thyromegaly; no carotid bruits or JVD Cardiovascular:  RRR, no m/r/g. No LE edema.  Respiratory:   decreased movement in RLL, faint crackle otherwise normal exam.   Normal respiratory effort. Abdomen:  soft, NT, ND, NABS Back:   normal alignment, no CVAT Skin:  no rash or induration seen on limited exam Musculoskeletal:  grossly normal tone BUE/BLE, good ROM, no bony abnormality Lower extremity:  No LE edema.  Limited foot exam with no ulcerations.  2+ distal pulses. Psychiatric:  grossly normal mood and affect, speech fluent and appropriate, AOx3 Neurologic:  CN 2-12 grossly intact, moves all extremities in coordinated fashion, sensation intact. DTR 2+   Radiological Exams on Admission: Independently reviewed - see discussion in A/P where applicable  CT HEAD WO CONTRAST ( )  Result Date: 07/26/2022 CLINICAL DATA:  Mental status change of unknown etiology. EXAM: CT HEAD WITHOUT CONTRAST TECHNIQUE: Contiguous axial images were obtained from the base of the skull through the vertex without intravenous contrast. RADIATION DOSE REDUCTION: This exam was performed according to the departmental dose-optimization program which includes automated exposure control, adjustment of the mA and/or kV  according to patient size and/or use of iterative reconstruction technique. COMPARISON:  04/26/2022 FINDINGS: Brain: No evidence of acute infarction, hemorrhage, hydrocephalus, extra-axial collection or mass lesion/mass effect. There is mild diffuse low-attenuation within the subcortical and periventricular white matter compatible with chronic microvascular disease. Vascular: No hyperdense vessel or unexpected calcification. Skull: Normal. Negative for fracture or focal lesion. Sinuses/Orbits: Paranasal sinuses and mastoid air cells are clear.  Right orbit lens replacement. Other: None. IMPRESSION: 1. No acute intracranial abnormalities. 2. Mild chronic microvascular disease. Electronically Signed   By: Signa Kell M.D.   On: 07/26/2022 18:03   DG Chest 2 View  Result Date: 07/26/2022 CLINICAL DATA:  Dry cough with labored breathing. EXAM: CHEST - 2 VIEW COMPARISON:  04/16/2022 FINDINGS: Lungs are hyperexpanded. Streaky opacity at the infrahilar medial right base may be atelectasis or pneumonia. Left lung clear. No pleural effusion. The cardiopericardial silhouette is within normal limits for size. No acute bony abnormality. IMPRESSION: Streaky opacity at the infrahilar medial right base may be atelectasis or pneumonia. Electronically Signed   By: Kennith Center M.D.   On: 07/26/2022 10:39    EKG: Independently reviewed.  NSR with rate 86; nonspecific ST changes with no evidence of acute ischemia, PAC. -new.    Labs on Admission: I have personally reviewed the available labs and imaging studies at the time of the admission.  Pertinent labs:   BUN: 28,  creatinine: 1.83,  sodium: 132,  wbc: 24.4,  hgb: 11.2,   Assessment and Plan: Principal Problem:   sepsis from pneumonia Active Problems:   intermittent confusion   COPD (chronic obstructive pulmonary disease)   Hyponatremia   Atrial flutter   Hypertension   CAD in native artery   CKD (chronic kidney disease), stage IV   Hypothyroidism    Iron deficiency anemia   Hyperlipidemia    Assessment and Plan: * sepsis from pneumonia 82 year old presenting with two day history of worsening shortness of breath, productive cough with increased sputum production, weakness, N/V found to have sepsis with leukocytosis, tachypnea and lactic acidosis and findings on CXR of pneumonia -obs to telemetry  -continue rocephin and azithromycin -lactic acidosis resolved  -check urinary antigens -BC pending -sputum culture  -check RVP and PCT -covid/flu/rsv negative -IS to bedside -mucinex BID  -duonebs scheduled with SABA prn  -gentle, time limited IVF  -trend CBC  -check vbg and bnp  -PT with weakness   intermittent confusion Grandson reports intermittent confusion and slurred speech She has no neuro deficit on exam and is alert and oriented to place, person and date -discussed sepsis/weakness/pneumonia could be cause -will check stat head CT -update TSH, check metabolic labs -UA still not collected  -neuro checks q4 hours x 4 -delirium precautions   COPD (chronic obstructive pulmonary disease) She has no wheezing, tightness to suggest exacerbation Given steroids in ED, but no wheezing on my exam with good air movement Do not think she is in exacerbation, but will watch closely if she needs steroids -scheduled duonebs -SABA PRN  -continue trelegy   Hyponatremia Likely secondary to hypovolemia hyponatremia Gentle, time limited IVF Trend If no improvement, will check urine studies   Atrial flutter In NSR Continue eliquis. Hold metoprolol with soft bp   Hypertension Well controlled-soft Hold bp medication with soft bp   CAD in native artery Stent in 03/2016  No complaints of chest pain Continue medical management with crestor, eliquis, metoprolol   CKD (chronic kidney disease), stage IV Appears to recently progressed from 3b>stage IV Stable Monitor I/o Gently, time limited IVF UA pending  Trend    Hypothyroidism TSH pending Continue synthroid daily   Iron deficiency anemia Baseline 11-12, stable Continue to monitor   Hyperlipidemia Continue crestor 20mg  daily     Advance Care Planning:   Code Status: Full Code   Consults: PT/OT and RT   DVT Prophylaxis: eliquis   Family  Communication: grandson at bedside-chris   Severity of Illness: The appropriate patient status for this patient is OBSERVATION. Observation status is judged to be reasonable and necessary in order to provide the required intensity of service to ensure the patient's safety. The patient's presenting symptoms, physical exam findings, and initial radiographic and laboratory data in the context of their medical condition is felt to place them at decreased risk for further clinical deterioration. Furthermore, it is anticipated that the patient will be medically stable for discharge from the hospital within 2 midnights of admission.   Author: Orland Mustard, MD 07/26/2022 10:29 PM  For on call review [email or web address].

## 2022-07-26 NOTE — Progress Notes (Addendum)
Lab called critical lab venous blood gas PO2 less than 31. Patients BP 95/45, pulse 87. MD Artis Flock made aware. New orders placed, give patient 500 mL bolus. Then recheck blood pressure and inform MD.

## 2022-07-26 NOTE — Assessment & Plan Note (Addendum)
82 year old presenting with two day history of worsening shortness of breath, productive cough with increased sputum production, weakness, N/V found to have sepsis with leukocytosis, tachypnea and lactic acidosis and findings on CXR of pneumonia -obs to telemetry  -continue rocephin and azithromycin -lactic acidosis resolved  -check urinary antigens -BC pending -sputum culture  -check RVP and PCT -covid/flu/rsv negative -IS to bedside -mucinex BID  -duonebs scheduled with SABA prn  -gentle, time limited IVF  -trend CBC  -check vbg and bnp  -PT with weakness

## 2022-07-26 NOTE — ED Provider Notes (Signed)
EMERGENCY DEPARTMENT AT [facility] Provider Note   CSN: [id number] Arrival date & time: 07/26/22  4098     History  Chief Complaint  Patient presents with   Shortness of Breath   Weakness    Patricia Jackson is a 82 y.o. female A-fib on Eliquis, CAD status post angioplasty with stent 03/15/2016, COPD, hypertension presents today for evaluation of shortness of breath.  Patient states she has been having cough and shortness of breath in the last few days but it actually got worse yesterday afternoon.  States she has been coughing up a lot of phlegm.  She also reports upper back pain does not resolve with OTC pain medication.  No known sick contacts.  Her family at bedside patient has been having increased weakness that she has trouble going to the bathroom herself.  Denies any recent fall or head injury.  States she has had numbness on her left hand due to a fracture and that has not changed.   Shortness of Breath Weakness Associated symptoms: shortness of breath       Past Medical History:  Diagnosis Date   A-fib    Atrial flutter    On Eliquis in 8/17 but discontinued after stent placement   Breast cancer    remote   CAD in native artery 03/16/2016   COPD (chronic obstructive pulmonary disease)    Dysrhythmia    Hypertension    Iron deficiency anemia 06/12/2021   NSTEMI (non-ST elevated myocardial infarction)    2017   S/P angioplasty with stent 03/15/16 DES Resolute, pLCX 03/16/2016   Vitreous hemorrhage of left eye 08/01/2019   Past Surgical History:  Procedure Laterality Date   APPENDECTOMY     BREAST SURGERY Left    CARDIAC CATHETERIZATION N/A 03/15/2016   Procedure: Left Heart Cath and Coronary Angiography;  Surgeon: Lyn Records, MD;  Location: [facility] INVASIVE CV LAB;  Service: Cardiovascular;  Laterality: N/A;   CARDIAC CATHETERIZATION N/A 03/15/2016   Procedure: Coronary Stent Intervention;  Surgeon: Lyn Records, MD;  Location: [facility] INVASIVE  CV LAB;  Service: Cardiovascular;  Laterality: N/A;   COLONOSCOPY     remote   COLONOSCOPY N/A 05/26/2016   Procedure: COLONOSCOPY;  Surgeon: Corbin Ade, MD;  Location: [facility] ENDO SUITE;  Service: Endoscopy;  Laterality: N/A;  845   ESOPHAGOGASTRODUODENOSCOPY N/A 05/26/2016   Procedure: ESOPHAGOGASTRODUODENOSCOPY (EGD);  Surgeon: Corbin Ade, MD;  Location: [facility] ENDO SUITE;  Service: Endoscopy;  Laterality: N/A;   EYE SURGERY     MALONEY DILATION N/A 05/26/2016   Procedure: Elease Hashimoto DILATION;  Surgeon: Corbin Ade, MD;  Location: [facility] ENDO SUITE;  Service: Endoscopy;  Laterality: N/A;   TOOTH EXTRACTION N/A 01/15/2022   Procedure: DENTAL RESTORATION/EXTRACTIONS;  Surgeon: Ocie Doyne, DMD;  Location: [facility] OR;  Service: Oral Surgery;  Laterality: N/A;   VITRECTOMY Left 10/03/2019   Dr. Luciana Axe, Vitrectomy, Focal Laser, Removal of Silicone Oil     Home Medications Prior to Admission medications   Medication Sig Start Date End Date Taking? Authorizing Provider  albuterol (PROVENTIL) (2.5 MG/3ML) 0.083% nebulizer solution Take 2.5 mg by nebulization every 6 (six) hours as needed for wheezing or shortness of breath.    [provider]  albuterol (VENTOLIN HFA) 108 (90 Base) MCG/ACT inhaler Inhale 1-2 puffs into the lungs every 6 (six) hours as needed for wheezing or shortness of breath. 03/02/22   Particia Nearing, PA-C  amLODipine-olmesartan (AZOR) 10-40 MG tablet Take 1  tablet by mouth every morning. 06/23/21   Rodolph Bong, MD  amoxicillin (AMOXIL) 500 MG capsule Take 1 capsule (500 mg total) by mouth 2 (two) times daily. Patient not taking: Reported on 04/26/2022 01/15/22   Ocie Doyne, DMD  apixaban (ELIQUIS) 5 MG TABS tablet TAKE ONE TABLET (5MG  TOTAL) BY MOUTH TWOTIMES DAILY 03/01/22   Quintella Reichert, MD  azithromycin (ZITHROMAX) 250 MG tablet Take first 2 tablets together, then 1 every day until finished. Patient not taking: Reported on 04/26/2022 03/02/22   Particia Nearing, PA-C  buPROPion (WELLBUTRIN XL) 300 MG 24 hr tablet Take 300 mg by mouth daily. 11/05/19   [provider]  cephALEXin (KEFLEX) 500 MG capsule Take 1 capsule (500 mg total) by mouth 3 (three) times daily. Patient not taking: Reported on 05/20/2022 05/09/22   Pauline Aus, PA-C  Cholecalciferol (VITAMIN D3) 50 MCG (2000 UT) TABS Take 2,000 Units by mouth daily.    [provider]  Copper Gluconate (COPPER CAPS PO) Take 6 mg by mouth daily.    [provider]  cyanocobalamin (VITAMIN B12) 1000 MCG tablet Take 1,000 mcg by mouth daily.    [provider]  denosumab (PROLIA) 60 MG/ML SOSY injection Inject 60 mg into the skin every 6 (six) months.    [provider]  fluticasone (FLONASE) 50 MCG/ACT nasal spray Place 2 sprays into both nostrils daily. 06/23/21   Rodolph Bong, MD  guaiFENesin (MUCINEX) 600 MG 12 hr tablet Take 1 tablet (600 mg total) by mouth 2 (two) times daily as needed. Patient not taking: Reported on 04/26/2022 03/02/22   Particia Nearing, PA-C  ipratropium-albuterol (DUONEB) 0.5-2.5 (3) MG/3ML SOLN Take 3 mLs by nebulization every 4 (four) hours as needed. 03/02/22   Particia Nearing, PA-C  levothyroxine (SYNTHROID) 50 MCG tablet Take 50 mcg by mouth daily before breakfast.  09/20/19   [provider]  loratadine (CLARITIN) 10 MG tablet Take 1 tablet (10 mg total) by mouth daily. Patient not taking: Reported on 01/13/2022 06/23/21   Rodolph Bong, MD  metoprolol succinate (TOPROL XL) 25 MG 24 hr tablet Take 1 tablet (25 mg total) by mouth daily. 09/02/21   Quintella Reichert, MD  nitroGLYCERIN (NITROSTAT) 0.4 MG SL tablet Place 0.4 mg under the tongue every 5 (five) minutes as needed for chest pain. 06/09/19   [provider]  oxyCODONE-acetaminophen (PERCOCET/ROXICET) 5-325 MG tablet Take 1 tablet by mouth every 6 (six) hours as needed for severe pain. Patient not taking: Reported on  05/20/2022 05/09/22   Pauline Aus, PA-C  oxyCODONE-acetaminophen (PERCOCET/ROXICET) 5-325 MG tablet Take 1 tablet by mouth every 4 (four) hours as needed. Patient not taking: Reported on 05/20/2022 05/09/22   Pauline Aus, PA-C  predniSONE (DELTASONE) 20 MG tablet Take 2 tablets (40 mg total) by mouth daily with breakfast. Patient not taking: Reported on 04/26/2022 03/02/22   Particia Nearing, PA-C  promethazine-dextromethorphan (PROMETHAZINE-DM) 6.25-15 MG/5ML syrup Take 5 mLs by mouth 4 (four) times daily as needed. Patient not taking: Reported on 04/26/2022 03/02/22   Particia Nearing, PA-C  rosuvastatin (CRESTOR) 20 MG tablet Take 20 mg by mouth daily.  02/26/17   [provider]  sertraline (ZOLOFT) 25 MG tablet Take 25 mg by mouth at bedtime.    [provider]  TRELEGY ELLIPTA 100-62.5-25 MCG/ACT AEPB Inhale 1 puff into the lungs daily. 06/13/21   [provider]      Allergies  Sulfa antibiotics    Review of Systems   Review of Systems  Respiratory:  Positive for shortness of breath.   Neurological:  Positive for weakness.    Physical Exam Updated Vital Signs BP 106/74 (BP Location: Right Arm)   Pulse 91   Temp 98.8 F (37.1 C) (Oral)   Resp (!) 40   SpO2 100%  Physical Exam Vitals and nursing note reviewed.  Constitutional:      Appearance: Normal appearance.  HENT:     Head: Normocephalic and atraumatic.     Mouth/Throat:     Mouth: Mucous membranes are moist.  Eyes:     General: No scleral icterus. Cardiovascular:     Rate and Rhythm: Normal rate and regular rhythm.     Pulses: Normal pulses.     Heart sounds: Normal heart sounds.  Pulmonary:     Effort: Pulmonary effort is normal.     Breath sounds: Wheezing present.  Abdominal:     General: Abdomen is flat.     Palpations: Abdomen is soft.     Tenderness: There is no abdominal tenderness.  Musculoskeletal:        General: No deformity.  Skin:    General: Skin is  warm.     Findings: No rash.  Neurological:     General: No focal deficit present.     Mental Status: She is alert.  Psychiatric:        Mood and Affect: Mood normal.     ED Results / Procedures / Treatments   Labs (all labs ordered are listed, but only abnormal results are displayed) Labs Reviewed  CULTURE, BLOOD (ROUTINE X 2)  CULTURE, BLOOD (ROUTINE X 2)  RESP PANEL BY RT-PCR (RSV, FLU A&B, COVID)  [id number]  COMPREHENSIVE METABOLIC PANEL  LACTIC ACID, PLASMA  LACTIC ACID, PLASMA  CBC WITH DIFFERENTIAL/PLATELET  PROTIME-INR  URINALYSIS, ROUTINE W REFLEX MICROSCOPIC  TROPONIN I (HIGH SENSITIVITY)    EKG None  Radiology DG Chest 2 View  Result Date: 07/26/2022 CLINICAL DATA:  Dry cough with labored breathing. EXAM: CHEST - 2 VIEW COMPARISON:  04/16/2022 FINDINGS: Lungs are hyperexpanded. Streaky opacity at the infrahilar medial right base may be atelectasis or pneumonia. Left lung clear. No pleural effusion. The cardiopericardial silhouette is within normal limits for size. No acute bony abnormality. IMPRESSION: Streaky opacity at the infrahilar medial right base may be atelectasis or pneumonia. Electronically Signed   By: Kennith Center M.D.   On: 07/26/2022 10:39    Procedures Procedures    Medications Ordered in ED Medications  albuterol (VENTOLIN HFA) 108 (90 Base) MCG/ACT inhaler 2 puff (has no administration in time range)    ED Course/ Medical Decision Making/ A&P                             Medical Decision Making Amount and/or Complexity of Data Reviewed Labs: ordered. Radiology: ordered.  Risk Prescription drug management. Decision regarding hospitalization.   This patient presents to the ED for shortness of breath, this involves an extensive number of treatment options, and is a complaint that carries with a high risk of complications and morbidity.  The differential diagnosis includes CHF/ACS, COPD asthma, pneumonia, anaphylaxis, PE, pneumothorax,  anxiety, COVID-19.  This is not an exhaustive list.  Lab tests: I ordered and personally interpreted labs.  The pertinent results include: WBC 24.4. Hbg unremarkable. Platelets unremarkable. Electrolytes unremarkable. BUN 28, creatinine 1.83.  BNP 497.  Panel negative.  Imaging studies: I ordered imaging studies. I personally reviewed, interpreted imaging and agree with the radiologist's interpretations. The results include: Chest x-ray showed pneumonia.  Problem list/ ED course/ Critical interventions/ Medical management: HPI: See above Vital signs within normal range and stable throughout visit. Laboratory/imaging studies significant for: See above. On physical examination, patient is afebrile and appears in no acute distress. This patient presents with dyspnea, most likely secondary to pneumonia and COPD exacerbation. Presentation not consistent with acute cardiac etiologies to include ACS (non ischemic ekg, unremarkable trop), pericardial effusion / tamponade.  Low suspicion for PE, Wells score 0.  Cannot rule out sepsis at this point given this and tachypnea.  Blood culture ordered.  Patient was ambulated in the hallway.  Per nursing staff patient was very weak to have more than 5 steps.  Think patient will admission.  Given Rocephin and IV, DuoNeb and steroid.  Will consult [facility] hospitalist.  I have reviewed the patient home medicines and have made adjustments as needed.  Cardiac monitoring/EKG: The patient was maintained on a cardiac monitor.  I personally reviewed and interpreted the cardiac monitor which showed an underlying rhythm of: sinus rhythm.  Additional history obtained: External records from outside source obtained and reviewed including: Chart review including previous notes, labs, imaging.  Consultations obtained: I requested consultation with Dr. Artis Flock, and discussed lab and imaging findings as well as pertinent plan.  He/she recommended  admission..  Disposition Admit. This chart was dictated using voice recognition software.  Despite best efforts to proofread,  errors can occur which can change the documentation meaning.          Final Clinical Impression(s) / ED Diagnoses Final diagnoses:  Pneumonia of right lower lobe due to infectious organism    Rx / DC Orders ED Discharge Orders     None         Jeanelle Malling, [place] 07/27/22 2006    Gloris Manchester, MD 07/31/22 [phone]

## 2022-07-26 NOTE — Assessment & Plan Note (Addendum)
In NSR Continue eliquis. Hold metoprolol with soft bp

## 2022-07-26 NOTE — Assessment & Plan Note (Signed)
Likely secondary to hypovolemia hyponatremia Gentle, time limited IVF Trend If no improvement, will check urine studies

## 2022-07-26 NOTE — Assessment & Plan Note (Signed)
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

## 2022-07-26 NOTE — Assessment & Plan Note (Signed)
TSH pending Continue synthroid daily

## 2022-07-26 NOTE — ED Notes (Signed)
Pt ambulated with Pulse Oximetry. She walked about 4 or 5 steps before complaining of being too out of breath and weak to walk further. Her lowest oxygen saturation during ambulation was 93. Her son is saying her speech has also began to be slurred compared to what she normally sounds like to him.

## 2022-07-26 NOTE — ED Triage Notes (Signed)
Pt c/o sob/cough/gen weakness/dizziness/body aches and nausea since yesterday. Strong dry cough noted with labored breathing noted in triage. A/o. Color wnl.

## 2022-07-26 NOTE — Assessment & Plan Note (Signed)
She has no wheezing, tightness to suggest exacerbation Given steroids in ED, but no wheezing on my exam with good air movement Do not think she is in exacerbation, but will watch closely if she needs steroids -scheduled duonebs -SABA PRN  -continue trelegy

## 2022-07-26 NOTE — Assessment & Plan Note (Signed)
Continue crestor 20mg daily  

## 2022-07-26 NOTE — Assessment & Plan Note (Signed)
Stent in 03/2016  No complaints of chest pain Continue medical management with crestor, eliquis, metoprolol

## 2022-07-26 NOTE — Assessment & Plan Note (Addendum)
Grandson reports intermittent confusion and slurred speech She has no neuro deficit on exam and is alert and oriented to place, person and date -discussed sepsis/weakness/pneumonia could be cause -will check stat head CT -update TSH, check metabolic labs -UA still not collected  -neuro checks q4 hours x 4 -delirium precautions

## 2022-07-27 ENCOUNTER — Inpatient Hospital Stay (HOSPITAL_COMMUNITY): Payer: 59

## 2022-07-27 ENCOUNTER — Other Ambulatory Visit (HOSPITAL_COMMUNITY): Payer: Self-pay | Admitting: *Deleted

## 2022-07-27 DIAGNOSIS — J440 Chronic obstructive pulmonary disease with acute lower respiratory infection: Secondary | ICD-10-CM | POA: Diagnosis present

## 2022-07-27 DIAGNOSIS — Z1152 Encounter for screening for COVID-19: Secondary | ICD-10-CM | POA: Diagnosis not present

## 2022-07-27 DIAGNOSIS — M546 Pain in thoracic spine: Secondary | ICD-10-CM | POA: Diagnosis present

## 2022-07-27 DIAGNOSIS — M810 Age-related osteoporosis without current pathological fracture: Secondary | ICD-10-CM | POA: Diagnosis not present

## 2022-07-27 DIAGNOSIS — Z87891 Personal history of nicotine dependence: Secondary | ICD-10-CM | POA: Diagnosis not present

## 2022-07-27 DIAGNOSIS — D509 Iron deficiency anemia, unspecified: Secondary | ICD-10-CM | POA: Diagnosis present

## 2022-07-27 DIAGNOSIS — J189 Pneumonia, unspecified organism: Secondary | ICD-10-CM | POA: Diagnosis present

## 2022-07-27 DIAGNOSIS — R0609 Other forms of dyspnea: Secondary | ICD-10-CM | POA: Diagnosis not present

## 2022-07-27 DIAGNOSIS — E871 Hypo-osmolality and hyponatremia: Secondary | ICD-10-CM | POA: Diagnosis not present

## 2022-07-27 DIAGNOSIS — A419 Sepsis, unspecified organism: Secondary | ICD-10-CM | POA: Diagnosis present

## 2022-07-27 DIAGNOSIS — I252 Old myocardial infarction: Secondary | ICD-10-CM | POA: Diagnosis not present

## 2022-07-27 DIAGNOSIS — Z955 Presence of coronary angioplasty implant and graft: Secondary | ICD-10-CM | POA: Diagnosis not present

## 2022-07-27 DIAGNOSIS — E785 Hyperlipidemia, unspecified: Secondary | ICD-10-CM | POA: Diagnosis present

## 2022-07-27 DIAGNOSIS — E872 Acidosis, unspecified: Secondary | ICD-10-CM | POA: Diagnosis present

## 2022-07-27 DIAGNOSIS — I4891 Unspecified atrial fibrillation: Secondary | ICD-10-CM | POA: Diagnosis present

## 2022-07-27 DIAGNOSIS — R0602 Shortness of breath: Secondary | ICD-10-CM | POA: Diagnosis present

## 2022-07-27 DIAGNOSIS — Z8249 Family history of ischemic heart disease and other diseases of the circulatory system: Secondary | ICD-10-CM | POA: Diagnosis not present

## 2022-07-27 DIAGNOSIS — E039 Hypothyroidism, unspecified: Secondary | ICD-10-CM | POA: Diagnosis present

## 2022-07-27 DIAGNOSIS — N184 Chronic kidney disease, stage 4 (severe): Secondary | ICD-10-CM | POA: Diagnosis present

## 2022-07-27 DIAGNOSIS — I129 Hypertensive chronic kidney disease with stage 1 through stage 4 chronic kidney disease, or unspecified chronic kidney disease: Secondary | ICD-10-CM | POA: Diagnosis present

## 2022-07-27 DIAGNOSIS — G8929 Other chronic pain: Secondary | ICD-10-CM | POA: Diagnosis present

## 2022-07-27 DIAGNOSIS — Z7901 Long term (current) use of anticoagulants: Secondary | ICD-10-CM | POA: Diagnosis not present

## 2022-07-27 DIAGNOSIS — I2510 Atherosclerotic heart disease of native coronary artery without angina pectoris: Secondary | ICD-10-CM | POA: Diagnosis present

## 2022-07-27 DIAGNOSIS — R410 Disorientation, unspecified: Secondary | ICD-10-CM | POA: Diagnosis not present

## 2022-07-27 DIAGNOSIS — E861 Hypovolemia: Secondary | ICD-10-CM | POA: Diagnosis present

## 2022-07-27 DIAGNOSIS — R200 Anesthesia of skin: Secondary | ICD-10-CM | POA: Diagnosis present

## 2022-07-27 DIAGNOSIS — I4892 Unspecified atrial flutter: Secondary | ICD-10-CM | POA: Diagnosis present

## 2022-07-27 LAB — RESPIRATORY PANEL BY PCR

## 2022-07-27 LAB — BASIC METABOLIC PANEL
Anion gap: 8 (ref 5–15)
BUN: 32 mg/dL — ABNORMAL HIGH (ref 8–23)
CO2: 18 mmol/L — ABNORMAL LOW (ref 22–32)
Calcium: 8.8 mg/dL — ABNORMAL LOW (ref 8.9–10.3)
Chloride: 109 mmol/L (ref 98–111)
Creatinine, Ser: 1.69 mg/dL — ABNORMAL HIGH (ref 0.44–1.00)
GFR, Estimated: 31 mL/min — ABNORMAL LOW (ref >60)
Glucose, Bld: 176 mg/dL — ABNORMAL HIGH (ref 70–99)
Potassium: 3.5 mmol/L (ref 3.5–5.1)
Sodium: 135 mmol/L (ref 135–145)

## 2022-07-27 LAB — CBC
HCT: 31.3 % — ABNORMAL LOW (ref 36.0–46.0)
Hemoglobin: 10.4 g/dL — ABNORMAL LOW (ref 12.0–15.0)
MCH: 31.1 pg (ref 26.0–34.0)
MCHC: 33.2 g/dL (ref 30.0–36.0)
MCV: 93.7 fL (ref 80.0–100.0)
Platelets: 104 10*3/uL — ABNORMAL LOW (ref 150–400)
RBC: 3.34 MIL/uL — ABNORMAL LOW (ref 3.87–5.11)
RDW: 13.9 % (ref 11.5–15.5)
WBC: 23.1 10*3/uL — ABNORMAL HIGH (ref 4.0–10.5)
nRBC: 0 % (ref 0.0–0.2)

## 2022-07-27 LAB — ECHOCARDIOGRAM COMPLETE
Area-P 1/2: 2.83 cm2
Height: 67 in
S' Lateral: 2.3 cm
Weight: 2250.46 oz

## 2022-07-27 LAB — CULTURE, BLOOD (ROUTINE X 2): Culture: NO GROWTH

## 2022-07-27 LAB — MAGNESIUM: Magnesium: 2.5 mg/dL — ABNORMAL HIGH (ref 1.7–2.4)

## 2022-07-27 LAB — PROCALCITONIN: Procalcitonin: 15.48 ng/mL

## 2022-07-27 NOTE — TOC Initial Note (Signed)
Transition of Care [facility]) - Initial/Assessment Note    Patient Details  Name: Patricia Jackson MRN: [id number] Date of Birth: 1940/11/11  [facility] CM/SW Contact:    Leitha Bleak, RN Phone Number: 07/27/2022, 1:58 PM  Clinical Narrative:          Patient admitted with sepsis pneumonia for home. CM spoke with grandson, patient complete HHPT with [facility]. PT is recommending HHPT, grandson is agreeable, [facility] is accepting. He is requested a  wheelchair. MD aware to order. Tommye Standard with [employer] will drop ship.    Expected Discharge Plan: Home w Home Health Services Barriers to Discharge: Continued Medical Work up  Patient Goals and CMS Choice Patient states their goals for this hospitalization and ongoing recovery are:: to go home. CMS Medicare.gov Compare Post Acute Care list provided to:: Patient Represenative (must comment) Choice offered to / list presented to : Adult Children     Expected Discharge Plan and Services      Living arrangements for the past 2 months: Single Family Home                 DME Arranged: Wheelchair manual DME Agency: [facility] Date DME Agency Contacted: 07/27/22 Time DME Agency Contacted: 1356 Representative spoke with at DME Agency: Tommye Standard HH Arranged: PT HH Agency: [facility] Date [facility] Agency Contacted: 07/27/22 Time HH Agency Contacted: 1357 Representative spoke with at [facility] Agency: [facility]yse Amass  Prior Living Arrangements/Services Living arrangements for the past 2 months: Single Family Home Lives with:: Adult Children Patient language and need for interpreter reviewed:: Yes Do you feel safe going back to the place where you live?: Yes      Need for Family Participation in Patient Care: Yes (Comment) Care giver support system in place?: Yes (comment) Current home services: DME Criminal Activity/Legal Involvement Pertinent to Current Situation/Hospitalization: No - Comment as needed  Activities of Daily Living Home  Assistive Devices/Equipment: Environmental consultant (specify type), Shower chair with back, Eyeglasses, Dentures (specify type), Hearing aid ADL Screening (condition at time of admission) Patient's cognitive ability adequate to safely complete daily activities?: Yes Is the patient deaf or have difficulty hearing?: Yes Does the patient have difficulty seeing, even when wearing glasses/contacts?: No Does the patient have difficulty concentrating, remembering, or making decisions?: Yes Patient able to express need for assistance with ADLs?: Yes Does the patient have difficulty dressing or bathing?: Yes Independently performs ADLs?: No Communication: Independent Dressing (OT): Needs assistance Is this a change from baseline?: Change from baseline, expected to last <3days Grooming: Independent Feeding: Independent Bathing: Needs assistance Is this a change from baseline?: Change from baseline, expected to last <3 days Toileting: Needs assistance Is this a change from baseline?: Change from baseline, expected to last <3 days In/Out Bed: Needs assistance Is this a change from baseline?: Change from baseline, expected to last <3 days Walks in Home: Independent with device (comment) Does the patient have difficulty walking or climbing stairs?: Yes Weakness of Legs: Both Weakness of Arms/Hands: Both  Permission Sought/Granted      Emotional Assessment      Orientation: : Oriented to Self Alcohol / Substance Use: Not Applicable Psych Involvement: No (comment)  Admission diagnosis:  Pneumonia [J18.9] Patient Active Problem List   Diagnosis Date Noted   sepsis from pneumonia 07/26/2022   intermittent confusion 07/26/2022   Hyponatremia 07/26/2022   Osteoporosis 01/19/2022   DOE (dyspnea on exertion)    Acidosis, unspecified 06/20/2021   Elevated troponin 06/19/2021  Hypothyroidism 06/19/2021   Iron deficiency anemia 06/12/2021   Chronic kidney disease, 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   Right epiretinal  membrane 02/17/2021   Retinal hemorrhage, right eye 11/04/2020   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   Traction detachment of left retina 08/03/2019   Left retinal detachment 08/01/2019   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/01/2019   Retinal hemorrhage of left eye 08/01/2019   Advanced nonexudative age-related macular degeneration of left eye with subfoveal involvement 08/01/2019   Total retinal detachment of left eye 07/26/2019   Choroidal detachment of right eye 07/26/2019   Exudative retinopathy of left eye 07/26/2019   Thrombocytopenia 07/04/2019   Orthostatic syncope 08/27/2017   Atypical chest pain 08/27/2017   Atrial flutter 10/28/2016   CKD (chronic kidney disease), 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   COPD (chronic obstructive pulmonary disease) 10/28/2016   Depression 10/28/2016   Esophageal dysphagia    Anemia 04/07/2016   Sinus bradycardia    CAD in native artery 03/16/2016   S/P angioplasty with stent 03/15/16 DES Resolute, pLCX 03/16/2016   NSTEMI (non-ST elevated myocardial infarction) 03/12/2016   Dyspnea on exertion    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   Hyperlipidemia 10/31/2015   Atrial flutter with rapid ventricular response 10/30/2015   Back pain 11/26/2013   Arm pain 11/26/2013   Chest pain 11/26/2013   Hypertension    PCP:  Benita Stabile, MD Pharmacy:   [facility] [place] [place] - [address]  [place] [address] Phone: [phone] Fax: [phone]   Social Determinants of Health (SDOH) Social History: SDOH Screenings   Tobacco Use: Medium Risk (07/26/2022)   SDOH Interventions:    Readmission Risk Interventions    06/22/2021   12:59 PM  Readmission Risk Prevention Plan  Transportation Screening Complete  PCP or Specialist Appt within 5-7 Days Complete  Home Care Screening Complete  Medication Review (RN CM) Complete

## 2022-07-27 NOTE — Plan of Care (Signed)
  Problem: Acute Rehab PT Goals(only PT should resolve) Goal: Pt Will Go Supine/Side To Sit Outcome: Progressing Flowsheets (Taken 07/27/2022 1438) Pt will go Supine/Side to Sit:  Independently  with modified independence Goal: Patient Will Transfer Sit To/From Stand Outcome: Progressing Flowsheets (Taken 07/27/2022 1438) Patient will transfer sit to/from stand:  with modified independence  with supervision Goal: Pt Will Transfer Bed To Chair/Chair To Bed Outcome: Progressing Flowsheets (Taken 07/27/2022 1438) Pt will Transfer Bed to Chair/Chair to Bed:  with modified independence  with supervision Goal: Pt Will Ambulate Outcome: Progressing Flowsheets (Taken 07/27/2022 1438) Pt will Ambulate:  75 feet  with supervision  with modified independence  with rolling walker   2:38 PM, 07/27/22 Patricia Jackson, MPT Physical Therapist with [facility] [phone] office [phone] mobile phone

## 2022-07-27 NOTE — Progress Notes (Addendum)
Wheelchair needed:  Patient suffers from COPD, weakness which impairs their ability to perform daily activities like ambulating in the home. A walker will not resolve issue with performing activities of daily living. A wheelchair will allow patient to safely perform daily activities. Patient can safely propel the wheelchair in the home or has a caregiver who can provide assistance

## 2022-07-27 NOTE — Progress Notes (Signed)
PROGRESS NOTE    Patricia Jackson  WUX:[id number] DOB: 1941-01-15 DOA: 07/26/2022 PCP: Benita Stabile, MD   Brief Narrative:    Patricia Jackson is a 82 y.o. female with medical history significant of atrial flutter on eliquis, remote hx of breast cancer, CAD, COPD, HTN, IDA, hypothyroidism who presented to ED with complaints of shortness of breath, cough and weakness/fatigue x2 days with worsening symptoms that prompted her to come to hospital.  Patient was admitted with sepsis secondary to community-acquired pneumonia and was started on Rocephin and azithromycin.  Anticipate discharge in the next 24-48 hours once further improved.  Assessment & Plan:   Principal Problem:   sepsis from pneumonia Active Problems:   intermittent confusion   COPD (chronic obstructive pulmonary disease)   Hyponatremia   Atrial flutter   Hypertension   CAD in native artery   CKD (chronic kidney disease), stage IV   Hypothyroidism   Iron deficiency anemia   Hyperlipidemia  Assessment and Plan:   Sepsis, POA due to CAP 82 year old presenting with two day history of worsening shortness of breath, productive cough with increased sputum production, weakness, N/V found to have sepsis with leukocytosis, tachypnea and lactic acidosis and findings on CXR of pneumonia -Urinary antigens pending -Treatments and Mucinex -Rocephin and azithromycin -Monitor leukocytosis   intermittent confusion Grandson reports intermittent confusion and slurred speech She has no neuro deficit on exam and is alert and oriented to place, person and date -discussed sepsis/weakness/pneumonia could be cause -CT head with no acute findings -TSH 1.2 -UA still not collected  -neuro checks q4 hours x 4 -delirium precautions    COPD (chronic obstructive pulmonary disease) She has no wheezing, tightness to suggest exacerbation Given steroids in ED, but no wheezing on my exam with good air movement Do not think she is in  exacerbation, but will watch closely if she needs steroids -scheduled duonebs -SABA PRN  -continue trelegy    Atrial flutter In NSR Continue eliquis. Hold metoprolol with soft bp    Hypertension Well controlled-soft Hold bp medication with soft bp    CAD in native artery Stent in 03/2016  No complaints of chest pain Continue medical management with crestor, eliquis, metoprolol    CKD (chronic kidney disease), stage IV Appears to recently progressed from 3b>stage IV Stable Monitor I/o Gently, time limited IVF UA pending  Trend    Hypothyroidism TSH pending Continue synthroid daily    Iron deficiency anemia Baseline 11-12, stable Continue to monitor    Hyperlipidemia Continue crestor  daily     DVT prophylaxis: Eliquis Code Status: Full Family Communication: None at bedside Disposition Plan:  Status is: Observation The patient will require care spanning > 2 midnights and should be moved to inpatient because: Need for IV antibiotics.   Consultants:  None  Procedures:  None  Antimicrobials:  Anti-infectives (From admission, onward)    Start     Dose/Rate Route Frequency Ordered Stop   07/27/22 1200  azithromycin (ZITHROMAX) 500 mg in sodium chloride 0.9 % 250 mL IVPB        500 mg 250 mL/hr over 60 Minutes Intravenous Every 24 hours 07/26/22 1738 07/31/22 1159   07/27/22 1000  cefTRIAXone (ROCEPHIN) 1 g in sodium chloride 0.9 % 100 mL IVPB        1 g 200 mL/hr over 30 Minutes Intravenous Every 24 hours 07/26/22 1738 07/31/22 0959   07/26/22 1130  cefTRIAXone (ROCEPHIN) 1 g in sodium chloride 0.9 % 100  mL IVPB        1 g 200 mL/hr over 30 Minutes Intravenous  Once 07/26/22 1123 07/26/22 1243   07/26/22 1130  azithromycin (ZITHROMAX) 500 mg in sodium chloride 0.9 % 250 mL IVPB        500 mg 250 mL/hr over 60 Minutes Intravenous  Once 07/26/22 1123 07/26/22 1630       Subjective: Patient seen and evaluated today with no new acute complaints  or concerns. No acute concerns or events noted overnight.  She continues to have a mild cough.  Objective: Vitals:   07/26/22 2000 07/27/22 0433 07/27/22 0500 07/27/22 0745  BP:  (!) 104/56    Pulse:  78  81  Resp:  18  14  Temp:      TempSrc:      SpO2: 95%   98%  Weight:   63.8 kg   Height:        Intake/Output Summary (Last 24 hours) at 07/27/2022 0815 Last data filed at 07/27/2022 0726 Gross per 24 hour  Intake 50 ml  Output --  Net 50 ml   Filed Weights   07/26/22 1843 07/27/22 0500  Weight: 61.9 kg 63.8 kg    Examination:  General exam: Appears calm and comfortable  Respiratory system: Clear to auscultation. Respiratory effort normal. Cardiovascular system: S1 & S2 heard, RRR.  Gastrointestinal system: Abdomen is soft Central nervous system: Alert and awake Extremities: No edema Skin: No significant lesions noted Psychiatry: Flat affect.    Data Reviewed: I have personally reviewed following labs and imaging studies  CBC: Recent Labs  Lab 07/26/22 1141 07/27/22 0438  WBC 24.4* 23.1*  NEUTROABS 21.7*  --   HGB 11.2* 10.4*  HCT 34.8* 31.3*  MCV 95.1 93.7  PLT 111* 104*   Basic Metabolic Panel: Recent Labs  Lab 07/26/22 1141 07/26/22 1804 07/27/22 0438  NA 132*  --  135  K 4.2  --  3.5  CL 104  --  109  CO2 18*  --  18*  GLUCOSE 126*  --  176*  BUN 28*  --  32*  CREATININE 1.83*  --  1.69*  CALCIUM 9.1  --  8.8*  MG  --  1.6* 2.5*   GFR: Estimated Creatinine Clearance: 26.2 mL/min (A) (by C-G formula based on SCr of 1.69 mg/dL (H)). Liver Function Tests: Recent Labs  Lab 07/26/22 1141  AST 32  ALT 14  ALKPHOS 69  BILITOT 0.7  PROT 6.7  ALBUMIN 3.6   No results for input(s): "LIPASE", "AMYLASE" in the last 168 hours. Recent Labs  Lab 07/26/22 1804  AMMONIA <10   Coagulation Profile: Recent Labs  Lab 07/26/22 1141  INR 1.5*   Cardiac Enzymes: No results for input(s): "CKTOTAL", "CKMB", "CKMBINDEX", "TROPONINI" in the last  168 hours. BNP (last 3 results) No results for input(s): "PROBNP" in the last 8760 hours. HbA1C: No results for input(s): "HGBA1C" in the last 72 hours. CBG: No results for input(s): "GLUCAP" in the last 168 hours. Lipid Profile: No results for input(s): "CHOL", "HDL", "LDLCALC", "TRIG", "CHOLHDL", "LDLDIRECT" in the last 72 hours. Thyroid Function Tests: Recent Labs    07/26/22 1804  TSH 1.200   Anemia Panel: Recent Labs    07/26/22 1804  VITAMINB12 521   Sepsis Labs: Recent Labs  Lab 07/26/22 1141 07/26/22 1320 07/26/22 1655 07/27/22 0438  PROCALCITON  --   --  15.74 15.48  LATICACIDVEN 2.0* 1.2  --   --  Recent Results (from the past 240 hour(s))  Resp panel by RT-PCR (RSV, Flu A&B, Covid) Anterior Nasal Swab     Status: None   Collection Time: 07/26/22 11:14 AM   Specimen: Anterior Nasal Swab  Result Value Ref Range Status   SARS Coronavirus 2 by RT PCR NEGATIVE NEGATIVE Final    Comment: (NOTE) SARS-CoV-2 target nucleic acids are NOT DETECTED.  The SARS-CoV-2 RNA is generally detectable in upper respiratory specimens during the acute phase of infection. The lowest concentration of SARS-CoV-2 viral copies this assay can detect is 138 copies/mL. A negative result does not preclude SARS-Cov-2 infection and should not be used as the sole basis for treatment or other patient management decisions. A negative result may occur with  improper specimen collection/handling, submission of specimen other than nasopharyngeal swab, presence of viral mutation(s) within the areas targeted by this assay, and inadequate number of viral copies(<138 copies/mL). A negative result must be combined with clinical observations, patient history, and epidemiological information. The expected result is Negative.  Fact Sheet for Patients:  BloggerCourse.com  Fact Sheet for Healthcare Providers:  SeriousBroker.it  This test is no t  yet approved or cleared by the [place] FDA and  has been authorized for detection and/or diagnosis of SARS-CoV-2 by FDA under an Emergency Use Authorization (EUA). This EUA will remain  in effect (meaning this test can be used) for the duration of the COVID-19 declaration under Section 564(b)(1) of the Act, 21 U.S.C.section 360bbb-3(b)(1), unless the authorization is terminated  or revoked sooner.       Influenza A by PCR NEGATIVE NEGATIVE Final   Influenza B by PCR NEGATIVE NEGATIVE Final    Comment: (NOTE) The Xpert Xpress SARS-CoV-2/FLU/RSV plus assay is intended as an aid in the diagnosis of influenza from Nasopharyngeal swab specimens and should not be used as a sole basis for treatment. Nasal washings and aspirates are unacceptable for Xpert Xpress SARS-CoV-2/FLU/RSV testing.  Fact Sheet for Patients: BloggerCourse.com  Fact Sheet for Healthcare Providers: SeriousBroker.it  This test is not yet approved or cleared by the [place] FDA and has been authorized for detection and/or diagnosis of SARS-CoV-2 by FDA under an Emergency Use Authorization (EUA). This EUA will remain in effect (meaning this test can be used) for the duration of the COVID-19 declaration under Section 564(b)(1) of the Act, 21 U.S.C. section 360bbb-3(b)(1), unless the authorization is terminated or revoked.     Resp Syncytial Virus by PCR NEGATIVE NEGATIVE Final    Comment: (NOTE) Fact Sheet for Patients: BloggerCourse.com  Fact Sheet for Healthcare Providers: SeriousBroker.it  This test is not yet approved or cleared by the [place] FDA and has been authorized for detection and/or diagnosis of SARS-CoV-2 by FDA under an Emergency Use Authorization (EUA). This EUA will remain in effect (meaning this test can be used) for the duration of the COVID-19 declaration under Section 564(b)(1)  of the Act, 21 U.S.C. section 360bbb-3(b)(1), unless the authorization is terminated or revoked.  Performed at [facility], [address], [place], [place] [address]   Culture, blood (Routine x 2)     Status: None (Preliminary result)   Collection Time: 07/26/22 11:41 AM   Specimen: Right Antecubital; Blood  Result Value Ref Range Status   Specimen Description   Final    RIGHT ANTECUBITAL BOTTLES DRAWN AEROBIC AND ANAEROBIC   Special Requests   Final    Blood Culture results may not be optimal due to an excessive volume of blood received  in culture bottles   Culture   Final    NO GROWTH < 24 HOURS Performed at [facility], [address] [place], [place] [address]    Report Status PENDING  Incomplete  Culture, blood (Routine x 2)     Status: None (Preliminary result)   Collection Time: 07/26/22 11:41 AM   Specimen: BLOOD RIGHT HAND  Result Value Ref Range Status   Specimen Description BLOOD RIGHT HAND AEROBIC BOTTLE ONLY  Final   Special Requests   Final    Blood Culture results may not be optimal due to an excessive volume of blood received in culture bottles   Culture   Final    NO GROWTH < 24 HOURS Performed at [facility], [address], [place], [place] [address]    Report Status PENDING  Incomplete         Radiology Studies: CT HEAD WO CONTRAST ( )  Result Date: 07/26/2022 CLINICAL DATA:  Mental status change of unknown etiology. EXAM: CT HEAD WITHOUT CONTRAST TECHNIQUE: Contiguous axial images were obtained from the base of the skull through the vertex without intravenous contrast. RADIATION DOSE REDUCTION: This exam was performed according to the departmental dose-optimization program which includes automated exposure control, adjustment of the mA and/or kV according to patient size and/or use of iterative reconstruction technique. COMPARISON:  04/26/2022 FINDINGS: Brain: No evidence of acute infarction, hemorrhage, hydrocephalus, extra-axial collection or mass  lesion/mass effect. There is mild diffuse low-attenuation within the subcortical and periventricular white matter compatible with chronic microvascular disease. Vascular: No hyperdense vessel or unexpected calcification. Skull: Normal. Negative for fracture or focal lesion. Sinuses/Orbits: Paranasal sinuses and mastoid air cells are clear. Right orbit lens replacement. Other: None. IMPRESSION: 1. No acute intracranial abnormalities. 2. Mild chronic microvascular disease. Electronically Signed   By: Signa Kell M.D.   On: 07/26/2022 18:03   DG Chest 2 View  Result Date: 07/26/2022 CLINICAL DATA:  Dry cough with labored breathing. EXAM: CHEST - 2 VIEW COMPARISON:  04/16/2022 FINDINGS: Lungs are hyperexpanded. Streaky opacity at the infrahilar medial right base may be atelectasis or pneumonia. Left lung clear. No pleural effusion. The cardiopericardial silhouette is within normal limits for size. No acute bony abnormality. IMPRESSION: Streaky opacity at the infrahilar medial right base may be atelectasis or pneumonia. Electronically Signed   By: Kennith Center M.D.   On: 07/26/2022 10:39        Scheduled Meds:  apixaban  5 mg Oral BID   fluticasone  2 spray Each Nare Daily   fluticasone furoate-vilanterol  1 puff Inhalation Daily   And   umeclidinium bromide  1 puff Inhalation Daily   ipratropium-albuterol  3 mL Nebulization Q6H   levothyroxine  50 mcg Oral QAC breakfast   lidocaine  1 patch Transdermal Q24H   Continuous Infusions:  azithromycin     cefTRIAXone (ROCEPHIN)  IV       LOS: 0 days    Time spent: 35 minutes    Tahirah Sara Hoover Brunette, DO [facility]  If 7PM-7AM, please contact night-coverage [email or web address] 07/27/2022, 8:15 AM

## 2022-07-27 NOTE — Progress Notes (Signed)
*  PRELIMINARY RESULTS* Echocardiogram 2D Echocardiogram has been performed.  Stacey Drain 07/27/2022, 1:07 PM

## 2022-07-27 NOTE — Evaluation (Signed)
Occupational Therapy Evaluation Patient Details Name: Patricia Jackson MRN: [id number] DOB: 1940/04/09 Today's Date: 07/27/2022   History of Present Illness Patricia Jackson is a 82 y.o. female with medical history significant of atrial flutter on eliquis, remote hx of breast cancer, CAD, COPD, HTN, IDA, hypothyroidism who presented to ED with complaints of shortness of breath, cough and weakness/fatigue x2 days with worsening symptoms that prompted her to come to hospital.  She states about 2 days again she had shortness of breath, productive cough with increased sputum, N/V. She denies any wheezing, sick contacts or fevers.  She has had poor PO intake over the past 2 days and increased weakness/dyspnea on exertion to the point she had to stop and rest after just a few steps and was having a hard time just getting up. She denies any swelling in her lower legs or weight gain. She has some chronic back pain in her right upper back that seems worse than normal, the lidocaine patch has helped. (per MD)   Clinical Impression   Pt agreeable to OT and PT co-evaluation. Pt presents generally weak with L wrist pain with movement due to previous fall. Pt educated on benefit of therapy to wrist possibly after home health OT. Pt reports 2 falls in the past 6 months. Good seated balance today with need for RW during ambulation due to unsteadiness in standing for step pivot to chair. Pt is generally weak and is able to have 24/7 assist at home. Pt will benefit from continued OT in the hospital and recommended venue below to increase strength, balance, and endurance for safe ADL's.         Recommendations for follow up therapy are one component of a multi-disciplinary discharge planning process, led by the attending physician.  Recommendations may be updated based on patient status, additional functional criteria and insurance authorization.   Assistance Recommended at Discharge Intermittent Supervision/Assistance   Patient can return home with the following A little help with walking and/or transfers;A little help with bathing/dressing/bathroom;Assist for transportation;Help with stairs or ramp for entrance;Assistance with cooking/housework    Functional Status Assessment  Patient has had a recent decline in their functional status and demonstrates the ability to make significant improvements in function in a reasonable and predictable amount of time.  Equipment Recommendations  None recommended by OT    Recommendations for Other Services       Precautions / Restrictions Precautions Precautions: Fall Restrictions Weight Bearing Restrictions: No      Mobility Bed Mobility Overal bed mobility: Modified Independent                  Transfers Overall transfer level: Needs assistance Equipment used: Rolling walker (2 wheels) Transfers: Sit to/from Stand, Bed to chair/wheelchair/BSC Sit to Stand: Min guard     Step pivot transfers: Min guard     General transfer comment: Unsteady in standing and step pivot to chair from EOB without RW; more steady during ambulation in room with RW.      Balance Overall balance assessment: Needs assistance Sitting-balance support: No upper extremity supported, Feet supported Sitting balance-Leahy Scale: Good Sitting balance - Comments: seated EOB   Standing balance support: During functional activity, Bilateral upper extremity supported Standing balance-Leahy Scale: Fair Standing balance comment: using RW                           ADL either performed or assessed with clinical judgement   ADL  Overall ADL's : Needs assistance/impaired     Grooming: Min guard;Standing   Upper Body Bathing: Set up;Sitting   Lower Body Bathing: Set up;Sitting/lateral leans       Lower Body Dressing: Set up;Sitting/lateral leans Lower Body Dressing Details (indicate cue type and reason): Pt able to doff and don socks seated at EOB without  physical assist. Toilet Transfer: Min guard;Rolling walker (2 wheels);Stand-pivot;Ambulation Toilet Transfer Details (indicate cue type and reason): Simualted via EOB to chair and ambulation in the room. Toileting- Clothing Manipulation and Hygiene: Set up;Sitting/lateral lean       Functional mobility during ADLs: Min guard;Rolling walker (2 wheels) General ADL Comments: Pt able to ambulate >5 feet in the room with min G assist and RW.     Vision Baseline Vision/History: 1 Wears glasses (R eye ongoing impairement which pt was not sure of. Stated that something detached.) Ability to See in Adequate Light: 2 Moderately impaired Patient Visual Report: No change from baseline Vision Assessment?:  (impaired at baseline. No new changes reported.)                Pertinent Vitals/Pain Pain Assessment Pain Assessment: Faces Faces Pain Scale: Hurts little more Pain Location: L wrist with flexion Pain Descriptors / Indicators: Grimacing Pain Intervention(s): Limited activity within patient's tolerance, Monitored during session, Repositioned     Hand Dominance Right   Extremity/Trunk Assessment Upper Extremity Assessment Upper Extremity Assessment: Generalized weakness;LUE deficits/detail LUE Deficits / Details: Pt reports fractured L wrist a couple months ago with no therapy follow up. Deformity noted with some limitation in wrist flexion A/ROM and pain.   Lower Extremity Assessment Lower Extremity Assessment: Defer to PT evaluation   Cervical / Trunk Assessment Cervical / Trunk Assessment: Normal   Communication Communication Communication: HOH   Cognition Arousal/Alertness: Awake/alert Behavior During Therapy: WFL for tasks assessed/performed Overall Cognitive Status: Within Functional Limits for tasks assessed                                                        Home Living Family/patient expects to be discharged to:: Private residence Living  Arrangements: Other relatives Available Help at Discharge: Family;Available 24 hours/day Type of Home: House Home Access: Stairs to enter [employer] of Steps: 3 Entrance Stairs-Rails: None Home Layout: Two level;Able to live on main level with bedroom/bathroom     Bathroom Shower/Tub: Producer, television/film/video: Standard Bathroom Accessibility: Yes How Accessible: Accessible via walker Home Equipment: Rolling Walker (2 wheels);Shower seat          Prior Functioning/Environment Prior Level of Function : Needs assist       Physical Assist : ADLs (physical);Mobility (physical) Mobility (physical): Gait;Transfers ADLs (physical): IADLs Mobility Comments: [place] ambulator leaning on walls and PRN supervision. ADLs Comments: Independent ADL; PRN assist IADL's.        OT Problem List: Decreased strength;Decreased range of motion;Decreased activity tolerance;Impaired balance (sitting and/or standing)      OT Treatment/Interventions: Self-care/ADL training;Therapeutic exercise;DME and/or AE instruction;Therapeutic activities;Patient/family education;Balance training    OT Goals(Current goals can be found in the care plan section) Acute Rehab OT Goals Patient Stated Goal: return home OT Goal Formulation: With patient Time For Goal Achievement: 08/10/22 Potential to Achieve Goals: Good  OT Frequency: Min 1X/week    Co-evaluation PT/OT/SLP Co-Evaluation/Treatment: Yes  Reason for Co-Treatment: To address functional/ADL transfers   OT goals addressed during session: ADL's and self-care      AM-PAC OT "6 Clicks" Daily Activity     Outcome Measure Help from another person eating meals?: None Help from another person taking care of personal grooming?: A Little Help from another person toileting, which includes using toliet, bedpan, or urinal?: A Little Help from another person bathing (including washing, rinsing, drying)?: None Help from another person to  put on and taking off regular upper body clothing?: None Help from another person to put on and taking off regular lower body clothing?: None 6 Click Score: 22   End of Session Equipment Utilized During Treatment: Rolling walker (2 wheels)  Activity Tolerance: Patient tolerated treatment well Patient left: in chair;with call bell/phone within reach  OT Visit Diagnosis: Unsteadiness on feet (R26.81);Other abnormalities of gait and mobility (R26.89);Muscle weakness (generalized) (M62.81)                Time: 6045-4098 OT Time Calculation (min): 16 min Charges:  OT General Charges $OT Visit: 1 Visit OT Evaluation $OT Eval Low Complexity: 1 Low  Adriene Padula OT, MOT  Danie Chandler 07/27/2022, 9:58 AM

## 2022-07-27 NOTE — Plan of Care (Signed)
  Problem: Acute Rehab OT Goals (only OT should resolve) Goal: Pt. Will Perform Grooming Flowsheets (Taken 07/27/2022 1000) Pt Will Perform Grooming:  with modified independence  standing Goal: Pt. Will Transfer To Toilet Flowsheets (Taken 07/27/2022 1000) Pt Will Transfer to Toilet:  with modified independence  ambulating Goal: Pt/Caregiver Will Perform Home Exercise Program Flowsheets (Taken 07/27/2022 1000) Pt/caregiver will Perform Home Exercise Program:  Increased ROM  Increased strength  Right Upper extremity  Left upper extremity  Independently  Kollins Fenter OT, MOT

## 2022-07-27 NOTE — Evaluation (Signed)
Physical Therapy Evaluation Patient Details Name: Patricia Jackson MRN: [id number] DOB: Apr 26, 1940 Today's Date: 07/27/2022  History of Present Illness  Patricia Jackson is a 82 y.o. female with medical history significant of atrial flutter on eliquis, remote hx of breast cancer, CAD, COPD, HTN, IDA, hypothyroidism who presented to ED with complaints of shortness of breath, cough and weakness/fatigue x2 days with worsening symptoms that prompted her to come to hospital.  She states about 2 days again she had shortness of breath, productive cough with increased sputum, N/V. She denies any wheezing, sick contacts or fevers.  She has had poor PO intake over the past 2 days and increased weakness/dyspnea on exertion to the point she had to stop and rest after just a few steps and was having a hard time just getting up. She denies any swelling in her lower legs or weight gain. She has some chronic back pain in her right upper back that seems worse than normal, the lidocaine patch has helped.   Clinical Impression  Patient demonstrates good return for sitting up at beside, unsteady on feet having to lean on nearby objects for support with not using an AD, safer using RW and able to walk in room without loss of balance, but limited mostly due to c/o fatigue and generalized weakness.  Patient tolerated sitting up in chair after therapy.  Patient will benefit from continued skilled physical therapy in hospital and recommended venue below to increase strength, balance, endurance for safe ADLs and gait.         Recommendations for follow up therapy are one component of a multi-disciplinary discharge planning process, led by the attending physician.  Recommendations may be updated based on patient status, additional functional criteria and insurance authorization.  Follow Up Recommendations       Assistance Recommended at Discharge Set up Supervision/Assistance  Patient can return home with the following  A  little help with walking and/or transfers;A little help with bathing/dressing/bathroom;Help with stairs or ramp for entrance;Assistance with cooking/housework    Equipment Recommendations None recommended by PT  Recommendations for Other Services       Functional Status Assessment Patient has had a recent decline in their functional status and demonstrates the ability to make significant improvements in function in a reasonable and predictable amount of time.     Precautions / Restrictions Precautions Precautions: Fall Restrictions Weight Bearing Restrictions: No      Mobility  Bed Mobility Overal bed mobility: Modified Independent                  Transfers Overall transfer level: Needs assistance Equipment used: Rolling walker (2 wheels) Transfers: Sit to/from Stand, Bed to chair/wheelchair/BSC Sit to Stand: Min guard   Step pivot transfers: Min guard       General transfer comment: unsteady labored movement having to lean on nearby objects for support without AD, safer using RW    Ambulation/Gait Ambulation/Gait assistance: Supervision, Min guard Gait Distance (Feet): 30 Feet Assistive device: Rolling walker (2 wheels) Gait Pattern/deviations: Decreased step length - right, Decreased step length - left, Decreased stride length Gait velocity: decreased     General Gait Details: slow labored unsteady cadence without loss of balance, limited mostly due to c/o fatigue and mild SOB  Stairs            Wheelchair Mobility    Modified Rankin (Stroke Patients Only)       Balance Overall balance assessment: Needs assistance Sitting-balance support: Feet supported, No  upper extremity supported Sitting balance-Leahy Scale: Good Sitting balance - Comments: seated EOB   Standing balance support: During functional activity, No upper extremity supported Standing balance-Leahy Scale: Poor Standing balance comment: fair/good using RW                              Pertinent Vitals/Pain Pain Assessment Pain Assessment: Faces Faces Pain Scale: Hurts little more Pain Location: L wrist with flexion Pain Descriptors / Indicators: Grimacing Pain Intervention(s): Limited activity within patient's tolerance, Monitored during session, Repositioned    Home Living Family/patient expects to be discharged to:: Private residence Living Arrangements: Other relatives Available Help at Discharge: Family;Available 24 hours/day Type of Home: House Home Access: Stairs to enter Entrance Stairs-Rails: None Entrance Stairs-Number of Steps: 3   Home Layout: Two level;Able to live on main level with bedroom/bathroom Home Equipment: Rolling Walker (2 wheels);Shower seat      Prior Function Prior Level of Function : Needs assist;History of Falls (last six months)       Physical Assist : ADLs (physical);Mobility (physical) Mobility (physical): Gait;Transfers ADLs (physical): IADLs Mobility Comments: [place] ambulator leaning on walls and PRN supervision. ADLs Comments: Independent ADL; PRN assist IADL's.     Hand Dominance   Dominant Hand: Right    Extremity/Trunk Assessment   Upper Extremity Assessment Upper Extremity Assessment: Defer to OT evaluation    Lower Extremity Assessment Lower Extremity Assessment: Generalized weakness    Cervical / Trunk Assessment Cervical / Trunk Assessment: Normal  Communication   Communication: HOH  Cognition Arousal/Alertness: Awake/alert Behavior During Therapy: WFL for tasks assessed/performed Overall Cognitive Status: Within Functional Limits for tasks assessed                                          General Comments      Exercises     Assessment/Plan    PT Assessment Patient needs continued PT services  PT Problem List Decreased strength;Decreased activity tolerance;Decreased balance;Decreased mobility       PT Treatment Interventions DME instruction;Gait  training;Stair training;Functional mobility training;Therapeutic activities;Therapeutic exercise;Balance training;Patient/family education    PT Goals (Current goals can be found in the Care Plan section)  Acute Rehab PT Goals Patient Stated Goal: return home with family to assist PT Goal Formulation: With patient Time For Goal Achievement: 08/03/22 Potential to Achieve Goals: Good    Frequency Min 3X/week     Co-evaluation PT/OT/SLP Co-Evaluation/Treatment: Yes Reason for Co-Treatment: To address functional/ADL transfers PT goals addressed during session: Mobility/safety with mobility;Balance;Proper use of DME         AM-PAC PT "6 Clicks" Mobility  Outcome Measure Help needed turning from your back to your side while in a flat bed without using bedrails?: None Help needed moving from lying on your back to sitting on the side of a flat bed without using bedrails?: None Help needed moving to and from a bed to a chair (including a wheelchair)?: A Little Help needed standing up from a chair using your arms (e.g., wheelchair or bedside chair)?: A Little Help needed to walk in hospital room?: A Little Help needed climbing 3-5 steps with a railing? : A Lot 6 Click Score: 19    End of Session   Activity Tolerance: Patient tolerated treatment well;Patient limited by fatigue Patient left: in chair;with call bell/phone within reach Nurse Communication:  Mobility status PT Visit Diagnosis: Unsteadiness on feet (R26.81);Other abnormalities of gait and mobility (R26.89);Muscle weakness (generalized) (M62.81)    Time: 1610-9604 PT Time Calculation (min) (ACUTE ONLY): 23 min   Charges:   PT Evaluation $PT Eval Moderate Complexity: 1 Mod PT Treatments $Therapeutic Activity: 23-37 mins        2:36 PM, 07/27/22 Ocie Bob, MPT Physical Therapist with [facility] [phone] office [phone] mobile phone

## 2022-07-28 DIAGNOSIS — J189 Pneumonia, unspecified organism: Secondary | ICD-10-CM | POA: Diagnosis not present

## 2022-07-28 LAB — CBC
HCT: 30.5 % — ABNORMAL LOW (ref 36.0–46.0)
Hemoglobin: 9.6 g/dL — ABNORMAL LOW (ref 12.0–15.0)
MCH: 30.3 pg (ref 26.0–34.0)
MCHC: 31.5 g/dL (ref 30.0–36.0)
MCV: 96.2 fL (ref 80.0–100.0)
Platelets: 104 10*3/uL — ABNORMAL LOW (ref 150–400)
RBC: 3.17 MIL/uL — ABNORMAL LOW (ref 3.87–5.11)
RDW: 14.3 % (ref 11.5–15.5)
WBC: 17.9 10*3/uL — ABNORMAL HIGH (ref 4.0–10.5)
nRBC: 0 % (ref 0.0–0.2)

## 2022-07-28 LAB — BASIC METABOLIC PANEL
Anion gap: 7 (ref 5–15)
BUN: 36 mg/dL — ABNORMAL HIGH (ref 8–23)
CO2: 19 mmol/L — ABNORMAL LOW (ref 22–32)
Calcium: 8.7 mg/dL — ABNORMAL LOW (ref 8.9–10.3)
Chloride: 112 mmol/L — ABNORMAL HIGH (ref 98–111)
Creatinine, Ser: 1.46 mg/dL — ABNORMAL HIGH (ref 0.44–1.00)
GFR, Estimated: 36 mL/min — ABNORMAL LOW (ref >60)
Glucose, Bld: 100 mg/dL — ABNORMAL HIGH (ref 70–99)
Potassium: 4 mmol/L (ref 3.5–5.1)
Sodium: 138 mmol/L (ref 135–145)

## 2022-07-28 LAB — MAGNESIUM: Magnesium: 2 mg/dL (ref 1.7–2.4)

## 2022-07-28 NOTE — Progress Notes (Signed)
Mobility Specialist Progress Note:    07/28/22 0940  Mobility  Activity Ambulated with assistance in room  Level of Assistance Minimal assist, patient does 75% or more  Assistive Device Front wheel walker  Distance Ambulated (ft) 24 ft  Activity Response Tolerated well  Mobility Referral Yes  $Mobility charge 1 Mobility   Pt agreeable to mobility session, received in bed. MinA required to stand, CGA while ambulating with RW. Tolerated well, reports L wrist is broken, WBAT. Pt had audible SOB and coughing throughout session, SpO2 99% on RA. Left pt sitting up in chair, call bell in reach, all needs met.  Feliciana Rossetti Mobility Specialist Please contact via Special educational needs teacher or  Rehab office at [phone]

## 2022-07-28 NOTE — Hospital Course (Addendum)
Brief Narrative:    Patricia Jackson is a 82 y.o. female with medical history significant of atrial flutter on eliquis, remote hx of breast cancer, CAD, COPD, HTN, IDA, hypothyroidism who presented to ED with complaints of shortness of breath, cough and weakness/fatigue x2 days with worsening symptoms that prompted her to come to hospital.  Patient was admitted with sepsis secondary to community-acquired pneumonia and was started on Rocephin and azithromycin.  Anticipate discharge in the next 24-48 hours once further improved.   Assessment & Plan:   Principal Problem:   sepsis from pneumonia Active Problems:   intermittent confusion   COPD (chronic obstructive pulmonary disease)   Hyponatremia   Atrial flutter   Hypertension   CAD in native artery   CKD (chronic kidney disease), stage IV   Hypothyroidism   Iron deficiency anemia   Hyperlipidemia   Assessment and Plan:   Sepsis, POA due to CAP -Continue to have shortness of breath, cough,  POA: Shortness of breath, cough with sputum production, weakness, N/V found to have sepsis with leukocytosis, tachypnea and lactic acidosis and findings on CXR of pneumonia -Improved sepsis physiology -hemodynamically stable -Urinary antigens pending -Continue mucolytics -Will continue IV antibiotics of Rocephin and azithromycin -Afebrile, normotensive, improved leukocytosis   -Respiratory panel influenza A/B, SARS-CoV-2 all negative   Intermittent confusion -Mentation at baseline now, AAO x3 now  [employer] reports intermittent confusion and slurred speech -likely due to sepsis, weakness, PNA   -CT head with no acute findings -TSH 1.2 -UA still not collected  -Neurochecks every 4 hours with no focal neurological findings -delirium precautions    COPD (chronic obstructive pulmonary disease) -Continue hide shortness of breath, with no overt wheezing -Satting 97% on room air Do not think she is in exacerbation, but will watch closely if  she needs steroids -scheduled duonebs -SABA PRN  -continue trelegy    Atrial flutter In NSR Continue Eliquis and metoprolol  Hypertension Resolved   CAD in native artery Stent in 03/2016  No complaints of chest pain Continue medical management with crestor, eliquis, metoprolol    CKD (chronic kidney disease), stage IV Appears to recently progressed from 3b>stage IV Stable Monitor I/o Gently, time limited IVF UA -not collected Lab Results  Component Value Date   CREATININE 1.46 (H) 07/28/2022   CREATININE 1.69 (H) 07/27/2022   CREATININE 1.83 (H) 07/26/2022      Hypothyroidism TSH pending Continue synthroid daily    Iron deficiency anemia Baseline 11-12, stable Continue to monitor    Hyperlipidemia Continue crestor  daily

## 2022-07-28 NOTE — Progress Notes (Signed)
PROGRESS NOTE    Patient: Patricia Jackson                            PCP: Benita Stabile, MD                    DOB: 06-03-1940            DOA: 07/26/2022 ZOX:[id number]             DOS: 07/28/2022, 12:33 PM   LOS: 1 day   Date of Service: The patient was seen and examined on 07/28/2022  Subjective:   The patient was seen and examined this morning. Hemodynamically stable.  Satting 97 on room air Shortness of air with exertion Persistent cough   Brief Narrative:   Brief Narrative:    Jaylene Arrowood is a 82 y.o. female with medical history significant of atrial flutter on eliquis, remote hx of breast cancer, CAD, COPD, HTN, IDA, hypothyroidism who presented to ED with complaints of shortness of breath, cough and weakness/fatigue x2 days with worsening symptoms that prompted her to come to hospital.  Patient was admitted with sepsis secondary to community-acquired pneumonia and was started on Rocephin and azithromycin.  Anticipate discharge in the next 24-48 hours once further improved.   Assessment & Plan:   Principal Problem:   sepsis from pneumonia Active Problems:   intermittent confusion   COPD (chronic obstructive pulmonary disease)   Hyponatremia   Atrial flutter   Hypertension   CAD in native artery   CKD (chronic kidney disease), stage IV   Hypothyroidism   Iron deficiency anemia   Hyperlipidemia   Assessment and Plan:   Sepsis, POA due to CAP -Continue to have shortness of breath, cough,  POA: Shortness of breath, cough with sputum production, weakness, N/V found to have sepsis with leukocytosis, tachypnea and lactic acidosis and findings on CXR of pneumonia -Improved sepsis physiology -hemodynamically stable -Urinary antigens pending -Continue mucolytics -Will continue IV antibiotics of Rocephin and azithromycin -Afebrile, normotensive, improved leukocytosis   -Respiratory panel influenza A/B, SARS-CoV-2 all negative   Intermittent confusion -Mentation at  baseline now, AAO x3 now  [employer] reports intermittent confusion and slurred speech -likely due to sepsis, weakness, PNA   -CT head with no acute findings -TSH 1.2 -UA still not collected  -Neurochecks every 4 hours with no focal neurological findings -delirium precautions    COPD (chronic obstructive pulmonary disease) -Continue hide shortness of breath, with no overt wheezing -Satting 97% on room air Do not think she is in exacerbation, but will watch closely if she needs steroids -scheduled duonebs -SABA PRN  -continue trelegy    Atrial flutter In NSR Continue Eliquis and metoprolol  Hypertension Resolved   CAD in native artery Stent in 03/2016  No complaints of chest pain Continue medical management with crestor, eliquis, metoprolol    CKD (chronic kidney disease), stage IV Appears to recently progressed from 3b>stage IV Stable Monitor I/o Gently, time limited IVF UA -not collected Lab Results  Component Value Date   CREATININE 1.46 (H) 07/28/2022   CREATININE 1.69 (H) 07/27/2022   CREATININE 1.83 (H) 07/26/2022      Hypothyroidism TSH pending Continue synthroid daily    Iron deficiency anemia Baseline 11-12, stable Continue to monitor    Hyperlipidemia Continue crestor 20mg  daily       ------------------------------------------------------------------------------------------------------------------------ Nutritional status:  The patient's BMI is: Body mass index is 21.82 kg/m.  I agree with the assessment and plan as outlined below: Nutrition Status:    ----------------------------------------------------------------------------------------------------------------------  DVT prophylaxis:   apixaban (ELIQUIS) tablet 5 mg   Code Status:   Code Status: Full Code  Family Communication: No family member present at bedside- attempt will be made to update daily The above findings and plan of care has been discussed with patient (and  family)  in detail,  they expressed understanding and agreement of above. -Advance care planning has been discussed.   Admission status:   Status is: Inpatient Remains inpatient appropriate because: Needing treatment IV antibiotics, IV fluids oxygen supplement   Disposition: From  - home             Planning for discharge in 1-2 days: to   Procedures:   No admission procedures for hospital encounter.   Antimicrobials:  Anti-infectives (From admission, onward)    Start     Dose/Rate Route Frequency Ordered Stop   07/27/22 1200  azithromycin (ZITHROMAX) 500 mg in sodium chloride 0.9 % 250 mL IVPB        500 mg 250 mL/hr over 60 Minutes Intravenous Every 24 hours 07/26/22 1738 07/31/22 1159   07/27/22 1000  cefTRIAXone (ROCEPHIN) 1 g in sodium chloride 0.9 % 100 mL IVPB        1 g 200 mL/hr over 30 Minutes Intravenous Every 24 hours 07/26/22 1738 07/31/22 0959   07/26/22 1130  cefTRIAXone (ROCEPHIN) 1 g in sodium chloride 0.9 % 100 mL IVPB        1 g 200 mL/hr over 30 Minutes Intravenous  Once 07/26/22 1123 07/26/22 1243   07/26/22 1130  azithromycin (ZITHROMAX) 500 mg in sodium chloride 0.9 % 250 mL IVPB        500 mg 250 mL/hr over 60 Minutes Intravenous  Once 07/26/22 1123 07/26/22 1630        Medication:   apixaban  5 mg Oral BID   fluticasone  2 spray Each Nare Daily   fluticasone furoate-vilanterol  1 puff Inhalation Daily   And   umeclidinium bromide  1 puff Inhalation Daily   levothyroxine  50 mcg Oral QAC breakfast   lidocaine  1 patch Transdermal Q24H    acetaminophen **OR** acetaminophen, albuterol, ondansetron **OR** ondansetron (ZOFRAN) IV   Objective:   Vitals:   07/27/22 2223 07/28/22 0300 07/28/22 0449 07/28/22 0717  BP: 122/81 (!) 154/74    Pulse: 91 98    Resp: (!) 24 20    Temp: 99 F (37.2 C) 97.8 F (36.6 C)    TempSrc: Oral Oral    SpO2: 96% 97%  97%  Weight:   63.2 kg   Height:        Intake/Output Summary (Last 24 hours) at  07/28/2022 1233 Last data filed at 07/28/2022 0900 Gross per 24 hour  Intake 590 ml  Output --  Net 590 ml   Filed Weights   07/26/22 1843 07/27/22 0500 07/28/22 0449  Weight: 61.9 kg 63.8 kg 63.2 kg     Physical examination:   Constitution:  Alert, cooperative, no distress,  Appears calm and comfortable  Psychiatric:   Normal and stable mood and affect, cognition intact,   HEENT:        Normocephalic, PERRL, otherwise with in Normal limits  Chest:         Chest symmetric Cardio vascular:  S1/S2, RRR, No murmure, No Rubs or Gallops  pulmonary: Clear to auscultation bilaterally, respirations unlabored, negative wheezes /  crackles Abdomen: Soft, non-tender, non-distended, bowel sounds,no masses, no organomegaly Muscular skeletal: Limited exam - in bed, able to move all 4 extremities,   Neuro: CNII-XII intact. , normal motor and sensation, reflexes intact  Extremities: No pitting edema lower extremities, +2 pulses  Skin: Dry, warm to touch, negative for any Rashes, No open wounds Wounds: per nursing documentation   ------------------------------------------------------------------------------------------------------------------------------------------    LABs:     Latest Ref Rng & Units 07/28/2022    4:41 AM 07/27/2022    4:38 AM 07/26/2022   11:41 AM  CBC  WBC 4.0 - 10.5 K/uL 17.9  23.1  24.4   Hemoglobin 12.0 - 15.0 g/dL 9.6  16.1  09.6   Hematocrit 36.0 - 46.0 % 30.5  31.3  34.8   Platelets 150 - 400 K/uL 104  104  111       Latest Ref Rng & Units 07/28/2022    4:41 AM 07/27/2022    4:38 AM 07/26/2022   11:41 AM  CMP  Glucose 70 - 99 mg/dL 045  409  811   BUN 8 - 23 mg/dL 36  32  28   Creatinine 0.44 - 1.00 mg/dL 9.14  7.82  9.56   Sodium 135 - 145 mmol/L 138  135  132   Potassium 3.5 - 5.1 mmol/L 4.0  3.5  4.2   Chloride 98 - 111 mmol/L 112  109  104   CO2 22 - 32 mmol/L 19  18  18    Calcium 8.9 - 10.3 mg/dL 8.7  8.8  9.1   Total Protein 6.5 - 8.1 g/dL   6.7    Total Bilirubin 0.3 - 1.2 mg/dL   0.7   Alkaline Phos 38 - 126 U/L   69   AST 15 - 41 U/L   32   ALT 0 - 44 U/L   14        Micro Results Recent Results (from the past 240 hour(s))  Resp panel by RT-PCR (RSV, Flu A&B, Covid) Anterior Nasal Swab     Status: None   Collection Time: 07/26/22 11:14 AM   Specimen: Anterior Nasal Swab  Result Value Ref Range Status   SARS Coronavirus 2 by RT PCR NEGATIVE NEGATIVE Final    Comment: (NOTE) SARS-CoV-2 target nucleic acids are NOT DETECTED.  The SARS-CoV-2 RNA is generally detectable in upper respiratory specimens during the acute phase of infection. The lowest concentration of SARS-CoV-2 viral copies this assay can detect is 138 copies/mL. A negative result does not preclude SARS-Cov-2 infection and should not be used as the sole basis for treatment or other patient management decisions. A negative result may occur with  improper specimen collection/handling, submission of specimen other than nasopharyngeal swab, presence of viral mutation(s) within the areas targeted by this assay, and inadequate number of viral copies(<138 copies/mL). A negative result must be combined with clinical observations, patient history, and epidemiological information. The expected result is Negative.  Fact Sheet for Patients:  BloggerCourse.com  Fact Sheet for Healthcare Providers:  SeriousBroker.it  This test is no t yet approved or cleared by the [place] FDA and  has been authorized for detection and/or diagnosis of SARS-CoV-2 by FDA under an Emergency Use Authorization (EUA). This EUA will remain  in effect (meaning this test can be used) for the duration of the COVID-19 declaration under Section 564(b)(1) of the Act, 21 U.S.C.section 360bbb-3(b)(1), unless the authorization is terminated  or revoked sooner.  Influenza A by PCR NEGATIVE NEGATIVE Final   Influenza B by PCR NEGATIVE  NEGATIVE Final    Comment: (NOTE) The Xpert Xpress SARS-CoV-2/FLU/RSV plus assay is intended as an aid in the diagnosis of influenza from Nasopharyngeal swab specimens and should not be used as a sole basis for treatment. Nasal washings and aspirates are unacceptable for Xpert Xpress SARS-CoV-2/FLU/RSV testing.  Fact Sheet for Patients: BloggerCourse.com  Fact Sheet for Healthcare Providers: SeriousBroker.it  This test is not yet approved or cleared by the [place] FDA and has been authorized for detection and/or diagnosis of SARS-CoV-2 by FDA under an Emergency Use Authorization (EUA). This EUA will remain in effect (meaning this test can be used) for the duration of the COVID-19 declaration under Section 564(b)(1) of the Act, 21 U.S.C. section 360bbb-3(b)(1), unless the authorization is terminated or revoked.     Resp Syncytial Virus by PCR NEGATIVE NEGATIVE Final    Comment: (NOTE) Fact Sheet for Patients: BloggerCourse.com  Fact Sheet for Healthcare Providers: SeriousBroker.it  This test is not yet approved or cleared by the [place] FDA and has been authorized for detection and/or diagnosis of SARS-CoV-2 by FDA under an Emergency Use Authorization (EUA). This EUA will remain in effect (meaning this test can be used) for the duration of the COVID-19 declaration under Section 564(b)(1) of the Act, 21 U.S.C. section 360bbb-3(b)(1), unless the authorization is terminated or revoked.  Performed at [facility], [address], [place], [place] [address]   Culture, blood (Routine x 2)     Status: None (Preliminary result)   Collection Time: 07/26/22 11:41 AM   Specimen: Right Antecubital; Blood  Result Value Ref Range Status   Specimen Description   Final    RIGHT ANTECUBITAL BOTTLES DRAWN AEROBIC AND ANAEROBIC   Special Requests   Final    Blood Culture results  may not be optimal due to an excessive volume of blood received in culture bottles   Culture   Final    NO GROWTH 2 DAYS Performed at [facility], [address], [place], [place] [address]    Report Status PENDING  Incomplete  Culture, blood (Routine x 2)     Status: None (Preliminary result)   Collection Time: 07/26/22 11:41 AM   Specimen: BLOOD RIGHT HAND  Result Value Ref Range Status   Specimen Description BLOOD RIGHT HAND AEROBIC BOTTLE ONLY  Final   Special Requests   Final    Blood Culture results may not be optimal due to an excessive volume of blood received in culture bottles   Culture   Final    NO GROWTH 2 DAYS Performed at [facility], [address], [place], [place] [address]    Report Status PENDING  Incomplete  Respiratory (~20 pathogens) panel by PCR     Status: None   Collection Time: 07/26/22  7:48 PM   Specimen: Nasopharyngeal Swab; Respiratory  Result Value Ref Range Status   Adenovirus NOT DETECTED NOT DETECTED Final   Coronavirus 229E NOT DETECTED NOT DETECTED Final    Comment: (NOTE) The Coronavirus on the Respiratory Panel, DOES NOT test for the novel  Coronavirus (2019 nCoV)    Coronavirus HKU1 NOT DETECTED NOT DETECTED Final   Coronavirus NL63 NOT DETECTED NOT DETECTED Final   Coronavirus OC43 NOT DETECTED NOT DETECTED Final   Metapneumovirus NOT DETECTED NOT DETECTED Final   Rhinovirus / Enterovirus NOT DETECTED NOT DETECTED Final   Influenza A NOT DETECTED NOT DETECTED Final   Influenza  B NOT DETECTED NOT DETECTED Final   Parainfluenza Virus 1 NOT DETECTED NOT DETECTED Final   Parainfluenza Virus 2 NOT DETECTED NOT DETECTED Final   Parainfluenza Virus 3 NOT DETECTED NOT DETECTED Final   Parainfluenza Virus 4 NOT DETECTED NOT DETECTED Final   Respiratory Syncytial Virus NOT DETECTED NOT DETECTED Final   Bordetella pertussis NOT DETECTED NOT DETECTED Final   Bordetella Parapertussis NOT DETECTED NOT DETECTED Final   Chlamydophila pneumoniae NOT  DETECTED NOT DETECTED Final   Mycoplasma pneumoniae NOT DETECTED NOT DETECTED Final    Comment: Performed at [facility], [address], [place], [place] [address]    Radiology Reports ECHOCARDIOGRAM COMPLETE  Result Date: 07/27/2022    ECHOCARDIOGRAM REPORT   Patient Name:   EVADENE WARDRIP Date of Exam: 07/27/2022 Medical Rec #:  [id number]       Height:       67.0 in Accession #:    [id number]      Weight:       140.7 lb Date of Birth:  07-Nov-1940      BSA:          1.741 m Patient Age:    81 years        BP:           104/56 mmHg Patient Gender: F               HR:           81 bpm. [facility] Procedure: 2D Echo, Cardiac Doppler and Color Doppler Indications:    Dyspnea R06.00  History:        Patient has prior history of Echocardiogram examinations, most                 recent 06/19/2021. CAD and Previous Myocardial Infarction, COPD,                 Arrythmias:Atrial Flutter; Risk Factors:Hypertension and                 Dyslipidemia. Breast cancer (From Hx).  Sonographer:    Celesta Gentile RCS Referring Phys: [id number] ALLISON WOLFE IMPRESSIONS  1. Left ventricular ejection fraction, by estimation, is 70 to 75%. The left ventricle has hyperdynamic function. The left ventricle has no regional wall motion abnormalities. Indeterminate diastolic filling due to E-A fusion. The E/e' is 23.6.  2. Right ventricular systolic function is normal. The right ventricular size is normal. Tricuspid regurgitation signal is inadequate for assessing PA pressure.  3. Left atrial size was mildly dilated.  4. The mitral valve is abnormal. Trivial mitral valve regurgitation. Mild mitral stenosis. Moderate to severe mitral annular calcification.  5. The aortic valve is tricuspid. There is mild calcification of the aortic valve. Aortic valve regurgitation is not visualized. No aortic stenosis is present.  6. The inferior vena cava is normal in size with greater than 50% respiratory variability, suggesting  right atrial pressure of 3 mmHg. Comparison(s): Changes from prior study are noted. Mitral stenosis is mild now compared to moderate in 06/2021. FINDINGS  Left Ventricle: Left ventricular ejection fraction, by estimation, is 70 to 75%. The left ventricle has hyperdynamic function. The left ventricle has no regional wall motion abnormalities. The left ventricular internal cavity size was normal in size. There is no left ventricular hypertrophy. Indeterminate diastolic filling due to E-A fusion. The E/e' is 23.6. Right Ventricle: The right ventricular size is normal. No increase in right ventricular wall thickness.  Right ventricular systolic function is normal. Tricuspid regurgitation signal is inadequate for assessing PA pressure. Left Atrium: Left atrial size was mildly dilated. Right Atrium: Right atrial size was normal in size. Pericardium: There is no evidence of pericardial effusion. Mitral Valve: The mitral valve is abnormal. Moderate to severe mitral annular calcification. Trivial mitral valve regurgitation. Mild mitral valve stenosis. The mean mitral valve gradient is 4.4 mmHg. Tricuspid Valve: The tricuspid valve is normal in structure. Tricuspid valve regurgitation is trivial. No evidence of tricuspid stenosis. Aortic Valve: The aortic valve is tricuspid. There is mild calcification of the aortic valve. Aortic valve regurgitation is not visualized. No aortic stenosis is present. Pulmonic Valve: The pulmonic valve was normal in structure. Pulmonic valve regurgitation is trivial. No evidence of pulmonic stenosis. Aorta: The aortic root is normal in size and structure. Venous: The inferior vena cava is normal in size with greater than 50% respiratory variability, suggesting right atrial pressure of 3 mmHg. IAS/Shunts: No atrial level shunt detected by color flow Doppler.  LEFT VENTRICLE PLAX 2D LVIDd:         4.00 cm   Diastology LVIDs:         2.30 cm   LV e' medial:    7.40 cm/s LV PW:         1.00 cm   LV  E/e' medial:  23.6 LV IVS:        1.00 cm   LV e' lateral:   9.25 cm/s LVOT diam:     1.70 cm   LV E/e' lateral: 18.9 LV SV:         68 LV SV Index:   39 LVOT Area:     2.27 cm  RIGHT VENTRICLE RV S prime:     12.50 cm/s TAPSE (M-mode): 2.8 cm LEFT ATRIUM             Index        RIGHT ATRIUM           Index LA diam:        3.70 cm 2.12 cm/m   RA Area:     16.40 cm LA Vol (A2C):   83.1 ml 47.72 ml/m  RA Volume:   48.20 ml  27.68 ml/m LA Vol (A4C):   66.2 ml 38.02 ml/m LA Biplane Vol: 72.9 ml 41.87 ml/m  AORTIC VALVE LVOT Vmax:   140.00 cm/s LVOT Vmean:  90.600 cm/s LVOT VTI:    0.299 m  AORTA Ao Root diam: 2.50 cm MITRAL VALVE MV Area (PHT): 2.83 cm     SHUNTS MV Mean grad:  4.4 mmHg     Systemic VTI:  0.30 m MV Decel Time: 268 msec     Systemic Diam: 1.70 cm MV E velocity: 175.00 cm/s MV A velocity: 164.00 cm/s MV E/A ratio:  1.07 Vishnu Priya Mallipeddi Electronically signed by Winfield Rast Mallipeddi Signature Date/Time: 07/27/2022/2:20:27 PM    Final     SIGNED: Kendell Bane, MD, FHM. FAAFP. [facility] - [facility] Time spent - 35 min.  In seeing, evaluating and examining the patient. Reviewing medical records, labs, drawn plan of care. [facility],  Pager (please use [email or web address] to page/ text) Please use Epic Secure Chat for non-urgent communication (7AM-7PM)  If 7PM-7AM, please contact night-coverage [email or web address], 07/28/2022, 12:33 PM

## 2022-07-29 DIAGNOSIS — J189 Pneumonia, unspecified organism: Secondary | ICD-10-CM | POA: Diagnosis not present

## 2022-07-29 LAB — BASIC METABOLIC PANEL
Anion gap: 8 (ref 5–15)
BUN: 21 mg/dL (ref 8–23)
CO2: 19 mmol/L — ABNORMAL LOW (ref 22–32)
Calcium: 8.7 mg/dL — ABNORMAL LOW (ref 8.9–10.3)
Chloride: 110 mmol/L (ref 98–111)
Creatinine, Ser: 1.23 mg/dL — ABNORMAL HIGH (ref 0.44–1.00)
GFR, Estimated: 45 mL/min — ABNORMAL LOW (ref >60)
Glucose, Bld: 111 mg/dL — ABNORMAL HIGH (ref 70–99)
Potassium: 3.6 mmol/L (ref 3.5–5.1)
Sodium: 137 mmol/L (ref 135–145)

## 2022-07-29 LAB — CBC
HCT: 30.2 % — ABNORMAL LOW (ref 36.0–46.0)
Hemoglobin: 10 g/dL — ABNORMAL LOW (ref 12.0–15.0)
MCH: 31.2 pg (ref 26.0–34.0)
MCHC: 33.1 g/dL (ref 30.0–36.0)
MCV: 94.1 fL (ref 80.0–100.0)
Platelets: 102 10*3/uL — ABNORMAL LOW (ref 150–400)
RBC: 3.21 MIL/uL — ABNORMAL LOW (ref 3.87–5.11)
RDW: 14.1 % (ref 11.5–15.5)
WBC: 6.7 10*3/uL (ref 4.0–10.5)
nRBC: 0 % (ref 0.0–0.2)

## 2022-07-29 LAB — CULTURE, BLOOD (ROUTINE X 2)

## 2022-07-29 MED ORDER — GUAIFENESIN ER 600 MG PO TB12: 600.0000 mg | ORAL_TABLET | Freq: Two times a day (BID) | ORAL | 0 refills | Status: DC | PRN

## 2022-07-29 MED ORDER — LEVOFLOXACIN 500 MG PO TABS: 500.0000 mg | ORAL_TABLET | Freq: Every day | ORAL | 0 refills | Status: AC

## 2022-07-29 MED ORDER — LACTINEX PO CHEW: 1.0000 | CHEWABLE_TABLET | Freq: Three times a day (TID) | ORAL | 0 refills | Status: AC

## 2022-07-29 NOTE — Progress Notes (Signed)
Left unit in wheelchair pushed by this Clinical research associate. Left in stable condition in a black SUV driven by Caucasian female (Pt's nephew).

## 2022-07-29 NOTE — Progress Notes (Signed)
Discharge instructions given. Pt verbalized understanding. Home meds also retrieved from pharmacy and presented to patient. No concerns voiced. Pt remains in room awaiting ride.

## 2022-07-29 NOTE — Progress Notes (Signed)
Physical Therapy Treatment Patient Details Name: Patricia Jackson MRN: [id number] DOB: 06-19-1940 Today's Date: 07/29/2022   History of Present Illness Gracen Ringwald is a 82 y.o. female with medical history significant of atrial flutter on eliquis, remote hx of breast cancer, CAD, COPD, HTN, IDA, hypothyroidism who presented to ED with complaints of shortness of breath, cough and weakness/fatigue x2 days with worsening symptoms that prompted her to come to hospital.  She states about 2 days again she had shortness of breath, productive cough with increased sputum, N/V. She denies any wheezing, sick contacts or fevers.  She has had poor PO intake over the past 2 days and increased weakness/dyspnea on exertion to the point she had to stop and rest after just a few steps and was having a hard time just getting up. She denies any swelling in her lower legs or weight gain. She has some chronic back pain in her right upper back that seems worse than normal, the lidocaine patch has helped.    PT Comments    Pt friendly and willing to participate with therapy this morning.  Independent with bed mobility just slow labored movements.  Min guard with transfers with noted instability upon standing, used RW for safety.  Slow cadence with occasional drifting though no LOB episodes during gait, increased time with wide turn around.  Added sidestep activity for balance with HHA for safety.  EOS pt left in chair with call bell within reach.  Monitored vitals through session WNL.     Recommendations for follow up therapy are one component of a multi-disciplinary discharge planning process, led by the attending physician.  Recommendations may be updated based on patient status, additional functional criteria and insurance authorization.  Follow Up Recommendations       Assistance Recommended at Discharge    Patient can return home with the following     Equipment Recommendations       Recommendations for  Other Services       Precautions / Restrictions Precautions Precautions: Fall Restrictions Weight Bearing Restrictions: No     Mobility  Bed Mobility Overal bed mobility: Independent                  Transfers Overall transfer level: Needs assistance Equipment used: Rolling walker (2 wheels)   Sit to Stand: Min guard           General transfer comment: unsteady labored movements, used RW for safety    Ambulation/Gait Ambulation/Gait assistance: Supervision, Min guard Gait Distance (Feet): 40 Feet Assistive device: Rolling walker (2 wheels) Gait Pattern/deviations: Decreased step length - right, Decreased step length - left, Decreased stride length Gait velocity: decreased     General Gait Details: slow labored unsteady cadence without loss of balance, limited mostly due to c/o fatigue and mild SOB   Stairs             Wheelchair Mobility    Modified Rankin (Stroke Patients Only)       Balance                                            Cognition Arousal/Alertness: Awake/alert Behavior During Therapy: WFL for tasks assessed/performed Overall Cognitive Status: Within Functional Limits for tasks assessed  Exercises Other Exercises Other Exercises: Sidestep front of handrail in hallway x 1 RT    General Comments        Pertinent Vitals/Pain Pain Assessment Pain Assessment: No/denies pain    Home Living                          Prior Function            PT Goals (current goals can now be found in the care plan section)      Frequency           PT Plan      Co-evaluation              AM-PAC PT "6 Clicks" Mobility   Outcome Measure  Help needed turning from your back to your side while in a flat bed without using bedrails?: None Help needed moving from lying on your back to sitting on the side of a flat bed without using  bedrails?: None Help needed moving to and from a bed to a chair (including a wheelchair)?: A Little Help needed standing up from a chair using your arms (e.g., wheelchair or bedside chair)?: A Little Help needed to walk in hospital room?: A Little Help needed climbing 3-5 steps with a railing? : A Lot 6 Click Score: 19    End of Session Equipment Utilized During Treatment: Gait belt Activity Tolerance: Patient tolerated treatment well;Patient limited by fatigue Patient left: in chair;with call bell/phone within reach Nurse Communication: Mobility status PT Visit Diagnosis: Unsteadiness on feet (R26.81);Other abnormalities of gait and mobility (R26.89);Muscle weakness (generalized) (M62.81)     Time: 1610-9604 PT Time Calculation (min) (ACUTE ONLY): 18 min  Charges:  $Therapeutic Activity: 8-22 mins                     Becky Sax, LPTA/CLT; CBIS [phone]  Juel Burrow 07/29/2022, 8:44 AM

## 2022-07-29 NOTE — TOC Transition Note (Signed)
Transition of Care [facility]) - CM/SW Discharge Note   Patient Details  Name: Patricia Jackson MRN: [id number] Date of Birth: 08-May-1940  [facility]) CM/SW Contact:  Villa Herb, LCSWA Phone Number: 07/29/2022, 12:02 PM   Clinical Narrative:    CSW updated that pt is medically stable for D/C home today. CSW confirmed with [employer] rep Tommye Standard that pts wheelchair was delivered on 4/24. CSW updated Kandee Keen with [facility] of plan for D/C today. CSW confirmed HH orders have been placed. TOC signing off.   Final next level of care: Home w Home Health Services Barriers to Discharge: Barriers Resolved   Patient Goals and CMS Choice CMS Medicare.gov Compare Post Acute Care list provided to:: Patient Represenative (must comment) Choice offered to / list presented to : Adult Children  Discharge Placement                         Discharge Plan and Services Additional resources added to the After Visit Summary for                  DME Arranged: Wheelchair manual DME Agency: [employer] Date DME Agency Contacted: 07/29/22 Time DME Agency Contacted: 1356 Representative spoke with at DME Agency: Tommye Standard HH Arranged: PT HH Agency: [facility] Date [facility] Agency Contacted: 07/29/22 Time HH Agency Contacted: 1357 Representative spoke with at [facility] Agency: Kandee Keen  Social Determinants of Health (SDOH) Interventions SDOH Screenings   Tobacco Use: Medium Risk (07/26/2022)     Readmission Risk Interventions    06/22/2021   12:59 PM  Readmission Risk Prevention Plan  Transportation Screening Complete  PCP or Specialist Appt within 5-7 Days Complete  Home Care Screening Complete  Medication Review (RN CM) Complete

## 2022-07-29 NOTE — Discharge Summary (Signed)
Physician Discharge Summary   Patient: Patricia Jackson MRN: [id number] DOB: 10-25-1940  Admit date:     07/26/2022  Discharge date: 07/29/22  Discharge Physician: Kendell Bane   PCP: Benita Stabile, MD   Recommendations at discharge:   Follow-up with PCP within 1 week.  Discharge Diagnoses: Principal Problem:   sepsis from pneumonia Active Problems:   intermittent confusion   COPD (chronic obstructive pulmonary disease)   Hyponatremia   Atrial flutter   Hypertension   CAD in native artery   CKD (chronic kidney disease), stage IV   Hypothyroidism   Iron deficiency anemia   Hyperlipidemia  Resolved Problems:   * No resolved hospital problems. *  Hospital Course: Brief Narrative:    Patricia Jackson is a 82 y.o. female with medical history significant of atrial flutter on eliquis, remote hx of breast cancer, CAD, COPD, HTN, IDA, hypothyroidism who presented to ED with complaints of shortness of breath, cough and weakness/fatigue x2 days with worsening symptoms that prompted her to come to hospital.  Patient was admitted with sepsis secondary to community-acquired pneumonia and was started on Rocephin and azithromycin.  Anticipate discharge in the next 24-48 hours once further improved.    Sepsis, POA due to CAP -Resolved sepsis physiology, rshortness of breath, cough improved POA: Shortness of breath, cough with sputum production, weakness, N/V found to have sepsis with leukocytosis, tachypnea and lactic acidosis and findings on CXR of pneumonia --hemodynamically stable, on room air satting 95% - IV antibiotics of Rocephin and azithromycin >>> switch to p.o. Levaquin -Afebrile, normotensive, improved leukocytosis   -Respiratory panel influenza A/B, SARS-CoV-2 all negative   Intermittent confusion -Mentation at baseline now, AAO x3 now  [employer] reports intermittent confusion and slurred speech -likely due to sepsis, weakness, PNA   -CT head with no acute  findings -TSH 1.2 -UA still not collected  -Neurochecks every 4 hours with no focal neurological findings    COPD (chronic obstructive pulmonary disease) Mild  Shortness of breath, with no overt wheezing -Satting 95% on room air Do not think she is in exacerbation, but will watch closely if she needs steroids -scheduled duonebs -SABA PRN  -continue trelegy    Atrial flutter In NSR Continue Eliquis and metoprolol  Hypertension Improved   CAD in native artery Stent in 03/2016  No complaints of chest pain Continue medical management with crestor, eliquis, metoprolol    CKD (chronic kidney disease), stage IV Appears to recently progressed from 3b>stage IV Stable Monitor I/o  UA -not collected Lab Results  Component Value Date   CREATININE 1.46 (H) 07/28/2022   CREATININE 1.69 (H) 07/27/2022   CREATININE 1.83 (H) 07/26/2022      Hypothyroidism TSH NL  Continue synthroid daily    Iron deficiency anemia Baseline 11-12, stable Continue to monitor    Hyperlipidemia Continue crestor 20mg  daily     Disposition: Home Diet recommendation:  Discharge Diet Orders (From admission, onward)     Start     Ordered   07/29/22 0000  Diet - low sodium heart healthy        07/29/22 1144           Cardiac diet DISCHARGE MEDICATION: Allergies as of 07/29/2022       Reactions   Sulfa Antibiotics    Unknown reaction         Medication List     STOP taking these medications    amLODipine-olmesartan 10-40 MG tablet Commonly known as: AZOR  buPROPion 300 MG 24 hr tablet Commonly known as: WELLBUTRIN XL   oxyCODONE-acetaminophen 5-325 MG tablet Commonly known as: PERCOCET/ROXICET       TAKE these medications    acetaminophen 500 MG tablet Commonly known as: TYLENOL Take by mouth.   albuterol (2.5 MG/3ML) 0.083% nebulizer solution Commonly known as: PROVENTIL Take 2.5 mg by nebulization every 6 (six) hours as needed for wheezing or shortness  of breath.   albuterol 108 (90 Base) MCG/ACT inhaler Commonly known as: VENTOLIN HFA Inhale 1-2 puffs into the lungs every 6 (six) hours as needed for wheezing or shortness of breath.   COPPER CAPS PO Take 6 mg by mouth daily.   denosumab 60 MG/ML Sosy injection Commonly known as: PROLIA Inject 60 mg into the skin every 6 (six) months.   diclofenac Sodium 1 % Gel Commonly known as: VOLTAREN APPLY 2 GRAMS TO THE AFFECTED AREA(S) BY TOPICAL ROUTE 4 TIMES PER DAY   Eliquis 5 MG Tabs tablet Generic drug: apixaban TAKE ONE TABLET (5MG  TOTAL) BY MOUTH TWOTIMES DAILY What changed: See the new instructions.   fluticasone 50 MCG/ACT nasal spray Commonly known as: FLONASE Place 2 sprays into both nostrils daily.   guaiFENesin 600 MG 12 hr tablet Commonly known as: Mucinex Take 1 tablet (600 mg total) by mouth 2 (two) times daily as needed.   lactobacillus acidophilus & bulgar chewable tablet Chew 1 tablet by mouth 3 (three) times daily with meals for 5 days.   levofloxacin 500 MG tablet Commonly known as: Levaquin Take 1 tablet (500 mg total) by mouth daily for 5 days.   levothyroxine 50 MCG tablet Commonly known as: SYNTHROID Take 50 mcg by mouth daily before breakfast.   metoprolol succinate 25 MG 24 hr tablet Commonly known as: Toprol XL Take 1 tablet (25 mg total) by mouth daily.   nitroGLYCERIN 0.4 MG SL tablet Commonly known as: NITROSTAT Place 0.4 mg under the tongue every 5 (five) minutes as needed for chest pain.   promethazine-dextromethorphan 6.25-15 MG/5ML syrup Commonly known as: PROMETHAZINE-DM Take 5 mLs by mouth 4 (four) times daily as needed.   rosuvastatin 20 MG tablet Commonly known as: CRESTOR Take 20 mg by mouth daily.   sertraline 25 MG tablet Commonly known as: ZOLOFT Take 25 mg by mouth at bedtime.   Trelegy Ellipta 100-62.5-25 MCG/ACT Aepb Generic drug: Fluticasone-Umeclidin-Vilant Inhale 1 puff into the lungs daily.   Vitamin D3 50 MCG  (2000 UT) Tabs Take 2,000 Units by mouth daily.               Durable Medical Equipment  (From admission, onward)           Start     Ordered   07/27/22 1359  For home use only DME standard manual wheelchair with seat cushion  Once       Comments: Patient suffers from weakness which impairs their ability to perform daily activities like grooming in the home.  A cane will not resolve issue with performing activities of daily living. A wheelchair will allow patient to safely perform daily activities. Patient can safely propel the wheelchair in the home or has a caregiver who can provide assistance. Length of need 6 months . Accessories: elevating leg rests (ELRs), wheel locks, extensions and anti-tippers.   07/27/22 1358            Follow-up Information     [facility] Follow up.   Specialty: Home Health Services Why: PT will  call to schedule your home visit. Contact information: [address] [place] [address] [phone]         [facility], [facility] Follow up.   Why: Wheelchair will be dropped shipped. Contact information: [address] [place] [address] [phone]                Discharge Exam: Ceasar Mons Weights   07/26/22 1843 07/27/22 0500 07/28/22 0449  Weight: 61.9 kg 63.8 kg 63.2 kg        General:  AAO x 3,  cooperative, no distress;   HEENT:  Normocephalic, PERRL, otherwise with in Normal limits   Neuro:  CNII-XII intact. , normal motor and sensation, reflexes intact   Lungs:   Clear to auscultation BL, Respirations unlabored,  No wheezes / crackles  Cardio:    S1/S2, RRR, No murmure, No Rubs or Gallops   Abdomen:  Soft, non-tender, bowel sounds active all four quadrants, no guarding or peritoneal signs.  Muscular  skeletal:  Limited exam -global generalized weaknesses - in bed, able to move all 4 extremities,   2+ pulses,  symmetric, No pitting edema  Skin:  Dry, warm to touch,  negative for any Rashes,  Wounds: Please see nursing documentation          Condition at discharge: good  The results of significant diagnostics from this hospitalization (including imaging, microbiology, ancillary and laboratory) are listed below for reference.   Imaging Studies: ECHOCARDIOGRAM COMPLETE  Result Date: 07/27/2022    ECHOCARDIOGRAM REPORT   Patient Name:   KIRSTI MCALPINE Date of Exam: 07/27/2022 Medical Rec #:  [id number]       Height:       67.0 in Accession #:    [id number]      Weight:       140.7 lb Date of Birth:  Jun 15, 1940      BSA:          1.741 m Patient Age:    81 years        BP:           104/56 mmHg Patient Gender: F               HR:           81 bpm. [facility] Procedure: 2D Echo, Cardiac Doppler and Color Doppler Indications:    Dyspnea R06.00  History:        Patient has prior history of Echocardiogram examinations, most                 recent 06/19/2021. CAD and Previous Myocardial Infarction, COPD,                 Arrythmias:Atrial Flutter; Risk Factors:Hypertension and                 Dyslipidemia. Breast cancer (From Hx).  Sonographer:    Celesta Gentile RCS Referring Phys: [id number] ALLISON WOLFE IMPRESSIONS  1. Left ventricular ejection fraction, by estimation, is 70 to 75%. The left ventricle has hyperdynamic function. The left ventricle has no regional wall motion abnormalities. Indeterminate diastolic filling due to E-A fusion. The E/e' is 23.6.  2. Right ventricular systolic function is normal. The right ventricular size is normal. Tricuspid regurgitation signal is inadequate for assessing PA pressure.  3. Left atrial size was mildly dilated.  4. The mitral valve is abnormal. Trivial mitral valve regurgitation. Mild mitral stenosis. Moderate to severe mitral annular  calcification.  5. The aortic valve is tricuspid. There is mild calcification of the aortic valve. Aortic valve regurgitation is not visualized. No aortic stenosis is present.  6. The  inferior vena cava is normal in size with greater than 50% respiratory variability, suggesting right atrial pressure of 3 mmHg. Comparison(s): Changes from prior study are noted. Mitral stenosis is mild now compared to moderate in 06/2021. FINDINGS  Left Ventricle: Left ventricular ejection fraction, by estimation, is 70 to 75%. The left ventricle has hyperdynamic function. The left ventricle has no regional wall motion abnormalities. The left ventricular internal cavity size was normal in size. There is no left ventricular hypertrophy. Indeterminate diastolic filling due to E-A fusion. The E/e' is 23.6. Right Ventricle: The right ventricular size is normal. No increase in right ventricular wall thickness. Right ventricular systolic function is normal. Tricuspid regurgitation signal is inadequate for assessing PA pressure. Left Atrium: Left atrial size was mildly dilated. Right Atrium: Right atrial size was normal in size. Pericardium: There is no evidence of pericardial effusion. Mitral Valve: The mitral valve is abnormal. Moderate to severe mitral annular calcification. Trivial mitral valve regurgitation. Mild mitral valve stenosis. The mean mitral valve gradient is 4.4 mmHg. Tricuspid Valve: The tricuspid valve is normal in structure. Tricuspid valve regurgitation is trivial. No evidence of tricuspid stenosis. Aortic Valve: The aortic valve is tricuspid. There is mild calcification of the aortic valve. Aortic valve regurgitation is not visualized. No aortic stenosis is present. Pulmonic Valve: The pulmonic valve was normal in structure. Pulmonic valve regurgitation is trivial. No evidence of pulmonic stenosis. Aorta: The aortic root is normal in size and structure. Venous: The inferior vena cava is normal in size with greater than 50% respiratory variability, suggesting right atrial pressure of 3 mmHg. IAS/Shunts: No atrial level shunt detected by color flow Doppler.  LEFT VENTRICLE PLAX 2D LVIDd:         4.00 cm    Diastology LVIDs:         2.30 cm   LV e' medial:    7.40 cm/s LV PW:         1.00 cm   LV E/e' medial:  23.6 LV IVS:        1.00 cm   LV e' lateral:   9.25 cm/s LVOT diam:     1.70 cm   LV E/e' lateral: 18.9 LV SV:         68 LV SV Index:   39 LVOT Area:     2.27 cm  RIGHT VENTRICLE RV S prime:     12.50 cm/s TAPSE (M-mode): 2.8 cm LEFT ATRIUM             Index        RIGHT ATRIUM           Index LA diam:        3.70 cm 2.12 cm/m   RA Area:     16.40 cm LA Vol (A2C):   83.1 ml 47.72 ml/m  RA Volume:   48.20 ml  27.68 ml/m LA Vol (A4C):   66.2 ml 38.02 ml/m LA Biplane Vol: 72.9 ml 41.87 ml/m  AORTIC VALVE LVOT Vmax:   140.00 cm/s LVOT Vmean:  90.600 cm/s LVOT VTI:    0.299 m  AORTA Ao Root diam: 2.50 cm MITRAL VALVE MV Area (PHT): 2.83 cm     SHUNTS MV Mean grad:  4.4 mmHg     Systemic VTI:  0.30 m MV Decel Time:  268 msec     Systemic Diam: 1.70 cm MV E velocity: 175.00 cm/s MV A velocity: 164.00 cm/s MV E/A ratio:  1.07 Vishnu Priya Mallipeddi Electronically signed by Winfield Rast Mallipeddi Signature Date/Time: 07/27/2022/2:20:27 PM    Final    CT HEAD WO CONTRAST ( )  Result Date: 07/26/2022 CLINICAL DATA:  Mental status change of unknown etiology. EXAM: CT HEAD WITHOUT CONTRAST TECHNIQUE: Contiguous axial images were obtained from the base of the skull through the vertex without intravenous contrast. RADIATION DOSE REDUCTION: This exam was performed according to the departmental dose-optimization program which includes automated exposure control, adjustment of the mA and/or kV according to patient size and/or use of iterative reconstruction technique. COMPARISON:  04/26/2022 FINDINGS: Brain: No evidence of acute infarction, hemorrhage, hydrocephalus, extra-axial collection or mass lesion/mass effect. There is mild diffuse low-attenuation within the subcortical and periventricular white matter compatible with chronic microvascular disease. Vascular: No hyperdense vessel or unexpected calcification.  Skull: Normal. Negative for fracture or focal lesion. Sinuses/Orbits: Paranasal sinuses and mastoid air cells are clear. Right orbit lens replacement. Other: None. IMPRESSION: 1. No acute intracranial abnormalities. 2. Mild chronic microvascular disease. Electronically Signed   By: Signa Kell M.D.   On: 07/26/2022 18:03   DG Chest 2 View  Result Date: 07/26/2022 CLINICAL DATA:  Dry cough with labored breathing. EXAM: CHEST - 2 VIEW COMPARISON:  04/16/2022 FINDINGS: Lungs are hyperexpanded. Streaky opacity at the infrahilar medial right base may be atelectasis or pneumonia. Left lung clear. No pleural effusion. The cardiopericardial silhouette is within normal limits for size. No acute bony abnormality. IMPRESSION: Streaky opacity at the infrahilar medial right base may be atelectasis or pneumonia. Electronically Signed   By: Kennith Center M.D.   On: 07/26/2022 10:39    Microbiology: Results for orders placed or performed during the hospital encounter of 07/26/22  Resp panel by RT-PCR (RSV, Flu A&B, Covid) Anterior Nasal Swab     Status: None   Collection Time: 07/26/22 11:14 AM   Specimen: Anterior Nasal Swab  Result Value Ref Range Status   SARS Coronavirus 2 by RT PCR NEGATIVE NEGATIVE Final    Comment: (NOTE) SARS-CoV-2 target nucleic acids are NOT DETECTED.  The SARS-CoV-2 RNA is generally detectable in upper respiratory specimens during the acute phase of infection. The lowest concentration of SARS-CoV-2 viral copies this assay can detect is 138 copies/mL. A negative result does not preclude SARS-Cov-2 infection and should not be used as the sole basis for treatment or other patient management decisions. A negative result may occur with  improper specimen collection/handling, submission of specimen other than nasopharyngeal swab, presence of viral mutation(s) within the areas targeted by this assay, and inadequate number of viral copies(<138 copies/mL). A negative result must be  combined with clinical observations, patient history, and epidemiological information. The expected result is Negative.  Fact Sheet for Patients:  BloggerCourse.com  Fact Sheet for Healthcare Providers:  SeriousBroker.it  This test is no t yet approved or cleared by the [place] FDA and  has been authorized for detection and/or diagnosis of SARS-CoV-2 by FDA under an Emergency Use Authorization (EUA). This EUA will remain  in effect (meaning this test can be used) for the duration of the COVID-19 declaration under Section 564(b)(1) of the Act, 21 U.S.C.section 360bbb-3(b)(1), unless the authorization is terminated  or revoked sooner.       Influenza A by PCR NEGATIVE NEGATIVE Final   Influenza B by PCR NEGATIVE NEGATIVE Final    Comment: (  NOTE) The Xpert Xpress SARS-CoV-2/FLU/RSV plus assay is intended as an aid in the diagnosis of influenza from Nasopharyngeal swab specimens and should not be used as a sole basis for treatment. Nasal washings and aspirates are unacceptable for Xpert Xpress SARS-CoV-2/FLU/RSV testing.  Fact Sheet for Patients: BloggerCourse.com  Fact Sheet for Healthcare Providers: SeriousBroker.it  This test is not yet approved or cleared by the [place] FDA and has been authorized for detection and/or diagnosis of SARS-CoV-2 by FDA under an Emergency Use Authorization (EUA). This EUA will remain in effect (meaning this test can be used) for the duration of the COVID-19 declaration under Section 564(b)(1) of the Act, 21 U.S.C. section 360bbb-3(b)(1), unless the authorization is terminated or revoked.     Resp Syncytial Virus by PCR NEGATIVE NEGATIVE Final    Comment: (NOTE) Fact Sheet for Patients: BloggerCourse.com  Fact Sheet for Healthcare Providers: SeriousBroker.it  This test is not yet  approved or cleared by the [place] FDA and has been authorized for detection and/or diagnosis of SARS-CoV-2 by FDA under an Emergency Use Authorization (EUA). This EUA will remain in effect (meaning this test can be used) for the duration of the COVID-19 declaration under Section 564(b)(1) of the Act, 21 U.S.C. section 360bbb-3(b)(1), unless the authorization is terminated or revoked.  Performed at [facility], [address], [place], [place] [address]   Culture, blood (Routine x 2)     Status: None (Preliminary result)   Collection Time: 07/26/22 11:41 AM   Specimen: Right Antecubital; Blood  Result Value Ref Range Status   Specimen Description   Final    RIGHT ANTECUBITAL BOTTLES DRAWN AEROBIC AND ANAEROBIC   Special Requests   Final    Blood Culture results may not be optimal due to an excessive volume of blood received in culture bottles   Culture   Final    NO GROWTH 3 DAYS Performed at [facility], [address] [place], [place] [address]    Report Status PENDING  Incomplete  Culture, blood (Routine x 2)     Status: None (Preliminary result)   Collection Time: 07/26/22 11:41 AM   Specimen: BLOOD RIGHT HAND  Result Value Ref Range Status   Specimen Description BLOOD RIGHT HAND AEROBIC BOTTLE ONLY  Final   Special Requests   Final    Blood Culture results may not be optimal due to an excessive volume of blood received in culture bottles   Culture   Final    NO GROWTH 3 DAYS Performed at [facility], [address], [place], [place] [address]    Report Status PENDING  Incomplete  Respiratory (~20 pathogens) panel by PCR     Status: None   Collection Time: 07/26/22  7:48 PM   Specimen: Nasopharyngeal Swab; Respiratory  Result Value Ref Range Status   Adenovirus NOT DETECTED NOT DETECTED Final   Coronavirus 229E NOT DETECTED NOT DETECTED Final    Comment: (NOTE) The Coronavirus on the Respiratory Panel, DOES NOT test for the novel  Coronavirus (2019 nCoV)     Coronavirus HKU1 NOT DETECTED NOT DETECTED Final   Coronavirus NL63 NOT DETECTED NOT DETECTED Final   Coronavirus OC43 NOT DETECTED NOT DETECTED Final   Metapneumovirus NOT DETECTED NOT DETECTED Final   Rhinovirus / Enterovirus NOT DETECTED NOT DETECTED Final   Influenza A NOT DETECTED NOT DETECTED Final   Influenza B NOT DETECTED NOT DETECTED Final   Parainfluenza Virus 1 NOT DETECTED NOT DETECTED Final   Parainfluenza Virus  2 NOT DETECTED NOT DETECTED Final   Parainfluenza Virus 3 NOT DETECTED NOT DETECTED Final   Parainfluenza Virus 4 NOT DETECTED NOT DETECTED Final   Respiratory Syncytial Virus NOT DETECTED NOT DETECTED Final   Bordetella pertussis NOT DETECTED NOT DETECTED Final   Bordetella Parapertussis NOT DETECTED NOT DETECTED Final   Chlamydophila pneumoniae NOT DETECTED NOT DETECTED Final   Mycoplasma pneumoniae NOT DETECTED NOT DETECTED Final    Comment: Performed at [facility], [address] [place], [place] [address]    Labs: CBC: Recent Labs  Lab 07/26/22 1141 07/27/22 0438 07/28/22 0441 07/29/22 0440  WBC 24.4* 23.1* 17.9* 6.7  NEUTROABS 21.7*  --   --   --   HGB 11.2* 10.4* 9.6* 10.0*  HCT 34.8* 31.3* 30.5* 30.2*  MCV 95.1 93.7 96.2 94.1  PLT 111* 104* 104* 102*   Basic Metabolic Panel: Recent Labs  Lab 07/26/22 1141 07/26/22 1804 07/27/22 0438 07/28/22 0441 07/29/22 0440  NA 132*  --  135 138 137  K 4.2  --  3.5 4.0 3.6  CL 104  --  109 112* 110  CO2 18*  --  18* 19* 19*  GLUCOSE 126*  --  176* 100* 111*  BUN 28*  --  32* 36* 21  CREATININE 1.83*  --  1.69* 1.46* 1.23*  CALCIUM 9.1  --  8.8* 8.7* 8.7*  MG  --  1.6* 2.5* 2.0  --    Liver Function Tests: Recent Labs  Lab 07/26/22 1141  AST 32  ALT 14  ALKPHOS 69  BILITOT 0.7  PROT 6.7  ALBUMIN 3.6   CBG: No results for input(s): "GLUCAP" in the last 168 hours.  Discharge time spent: greater than 30 minutes.  Signed: Kendell Bane, MD [facility] 07/29/2022

## 2022-07-29 NOTE — Care Management Important Message (Signed)
Important Message  Patient Details  Name: Patricia Jackson MRN: [id number] Date of Birth: Jul 27, 1940   Medicare Important Message Given:  Yes     Corey Harold 07/29/2022, 11:01 AM

## 2022-07-30 LAB — PROLACTIN: Prolactin: 26.3 ng/mL — ABNORMAL HIGH (ref 3.6–25.2)

## 2022-07-30 LAB — CULTURE, BLOOD (ROUTINE X 2)

## 2022-07-31 LAB — CULTURE, BLOOD (ROUTINE X 2): Culture: NO GROWTH

## 2022-08-05 DIAGNOSIS — N184 Chronic kidney disease, stage 4 (severe): Secondary | ICD-10-CM | POA: Diagnosis not present

## 2022-08-05 DIAGNOSIS — I1310 Hypertensive heart and chronic kidney disease without heart failure, with stage 1 through stage 4 chronic kidney disease, or unspecified chronic kidney disease: Secondary | ICD-10-CM | POA: Diagnosis not present

## 2022-08-05 DIAGNOSIS — J440 Chronic obstructive pulmonary disease with acute lower respiratory infection: Secondary | ICD-10-CM | POA: Diagnosis not present

## 2022-08-05 DIAGNOSIS — D631 Anemia in chronic kidney disease: Secondary | ICD-10-CM | POA: Diagnosis not present

## 2022-08-11 DIAGNOSIS — D631 Anemia in chronic kidney disease: Secondary | ICD-10-CM | POA: Diagnosis not present

## 2022-08-11 DIAGNOSIS — J440 Chronic obstructive pulmonary disease with acute lower respiratory infection: Secondary | ICD-10-CM | POA: Diagnosis not present

## 2022-08-11 DIAGNOSIS — I1310 Hypertensive heart and chronic kidney disease without heart failure, with stage 1 through stage 4 chronic kidney disease, or unspecified chronic kidney disease: Secondary | ICD-10-CM | POA: Diagnosis not present

## 2022-08-11 DIAGNOSIS — N184 Chronic kidney disease, stage 4 (severe): Secondary | ICD-10-CM | POA: Diagnosis not present

## 2022-08-13 DIAGNOSIS — J440 Chronic obstructive pulmonary disease with acute lower respiratory infection: Secondary | ICD-10-CM | POA: Diagnosis not present

## 2022-08-13 DIAGNOSIS — D631 Anemia in chronic kidney disease: Secondary | ICD-10-CM | POA: Diagnosis not present

## 2022-08-13 DIAGNOSIS — N1832 Chronic kidney disease, stage 3b: Secondary | ICD-10-CM | POA: Diagnosis not present

## 2022-08-13 DIAGNOSIS — N184 Chronic kidney disease, stage 4 (severe): Secondary | ICD-10-CM | POA: Diagnosis not present

## 2022-08-13 DIAGNOSIS — J449 Chronic obstructive pulmonary disease, unspecified: Secondary | ICD-10-CM | POA: Diagnosis not present

## 2022-08-13 DIAGNOSIS — E871 Hypo-osmolality and hyponatremia: Secondary | ICD-10-CM | POA: Diagnosis not present

## 2022-08-13 DIAGNOSIS — I1310 Hypertensive heart and chronic kidney disease without heart failure, with stage 1 through stage 4 chronic kidney disease, or unspecified chronic kidney disease: Secondary | ICD-10-CM | POA: Diagnosis not present

## 2022-08-13 DIAGNOSIS — Z23 Encounter for immunization: Secondary | ICD-10-CM | POA: Diagnosis not present

## 2022-08-13 DIAGNOSIS — R410 Disorientation, unspecified: Secondary | ICD-10-CM | POA: Diagnosis not present

## 2022-08-13 DIAGNOSIS — Z0001 Encounter for general adult medical examination with abnormal findings: Secondary | ICD-10-CM | POA: Diagnosis not present

## 2022-08-16 DIAGNOSIS — J440 Chronic obstructive pulmonary disease with acute lower respiratory infection: Secondary | ICD-10-CM | POA: Diagnosis not present

## 2022-08-16 DIAGNOSIS — D631 Anemia in chronic kidney disease: Secondary | ICD-10-CM | POA: Diagnosis not present

## 2022-08-16 DIAGNOSIS — N184 Chronic kidney disease, stage 4 (severe): Secondary | ICD-10-CM | POA: Diagnosis not present

## 2022-08-16 DIAGNOSIS — I1310 Hypertensive heart and chronic kidney disease without heart failure, with stage 1 through stage 4 chronic kidney disease, or unspecified chronic kidney disease: Secondary | ICD-10-CM | POA: Diagnosis not present

## 2022-08-20 DIAGNOSIS — N184 Chronic kidney disease, stage 4 (severe): Secondary | ICD-10-CM | POA: Diagnosis not present

## 2022-08-20 DIAGNOSIS — J440 Chronic obstructive pulmonary disease with acute lower respiratory infection: Secondary | ICD-10-CM | POA: Diagnosis not present

## 2022-08-20 DIAGNOSIS — D631 Anemia in chronic kidney disease: Secondary | ICD-10-CM | POA: Diagnosis not present

## 2022-08-20 DIAGNOSIS — I1310 Hypertensive heart and chronic kidney disease without heart failure, with stage 1 through stage 4 chronic kidney disease, or unspecified chronic kidney disease: Secondary | ICD-10-CM | POA: Diagnosis not present

## 2022-08-23 DIAGNOSIS — I1310 Hypertensive heart and chronic kidney disease without heart failure, with stage 1 through stage 4 chronic kidney disease, or unspecified chronic kidney disease: Secondary | ICD-10-CM | POA: Diagnosis not present

## 2022-08-23 DIAGNOSIS — J440 Chronic obstructive pulmonary disease with acute lower respiratory infection: Secondary | ICD-10-CM | POA: Diagnosis not present

## 2022-08-23 DIAGNOSIS — N184 Chronic kidney disease, stage 4 (severe): Secondary | ICD-10-CM | POA: Diagnosis not present

## 2022-08-23 DIAGNOSIS — D631 Anemia in chronic kidney disease: Secondary | ICD-10-CM | POA: Diagnosis not present

## 2022-08-27 DIAGNOSIS — D631 Anemia in chronic kidney disease: Secondary | ICD-10-CM | POA: Diagnosis not present

## 2022-08-27 DIAGNOSIS — J440 Chronic obstructive pulmonary disease with acute lower respiratory infection: Secondary | ICD-10-CM | POA: Diagnosis not present

## 2022-08-27 DIAGNOSIS — N184 Chronic kidney disease, stage 4 (severe): Secondary | ICD-10-CM | POA: Diagnosis not present

## 2022-08-27 DIAGNOSIS — I1310 Hypertensive heart and chronic kidney disease without heart failure, with stage 1 through stage 4 chronic kidney disease, or unspecified chronic kidney disease: Secondary | ICD-10-CM | POA: Diagnosis not present

## 2022-08-31 DIAGNOSIS — N184 Chronic kidney disease, stage 4 (severe): Secondary | ICD-10-CM | POA: Diagnosis not present

## 2022-08-31 DIAGNOSIS — D631 Anemia in chronic kidney disease: Secondary | ICD-10-CM | POA: Diagnosis not present

## 2022-08-31 DIAGNOSIS — I1310 Hypertensive heart and chronic kidney disease without heart failure, with stage 1 through stage 4 chronic kidney disease, or unspecified chronic kidney disease: Secondary | ICD-10-CM | POA: Diagnosis not present

## 2022-08-31 DIAGNOSIS — J440 Chronic obstructive pulmonary disease with acute lower respiratory infection: Secondary | ICD-10-CM | POA: Diagnosis not present

## 2022-09-01 ENCOUNTER — Telehealth: Payer: Self-pay | Admitting: Rheumatology

## 2022-09-01 NOTE — Telephone Encounter (Signed)
I returned patient's call and advised her that Dr. Corliss Skains has never prescribed the levothyroxine medication for her. I advised patient to call her PCP in regards to this. Patient verbalized understanding.

## 2022-09-01 NOTE — Telephone Encounter (Signed)
Patient called requesting a return call regarding her Levothyroxine medication.  Patient states Dr. Corliss Skains was the doctor who originally prescribed the medication and she is not sure if she needs to continue taking it.

## 2022-09-02 DIAGNOSIS — E782 Mixed hyperlipidemia: Secondary | ICD-10-CM | POA: Diagnosis not present

## 2022-09-02 DIAGNOSIS — R7301 Impaired fasting glucose: Secondary | ICD-10-CM | POA: Diagnosis not present

## 2022-09-02 DIAGNOSIS — D631 Anemia in chronic kidney disease: Secondary | ICD-10-CM | POA: Diagnosis not present

## 2022-09-02 DIAGNOSIS — E039 Hypothyroidism, unspecified: Secondary | ICD-10-CM | POA: Diagnosis not present

## 2022-09-02 DIAGNOSIS — J440 Chronic obstructive pulmonary disease with acute lower respiratory infection: Secondary | ICD-10-CM | POA: Diagnosis not present

## 2022-09-02 DIAGNOSIS — I1310 Hypertensive heart and chronic kidney disease without heart failure, with stage 1 through stage 4 chronic kidney disease, or unspecified chronic kidney disease: Secondary | ICD-10-CM | POA: Diagnosis not present

## 2022-09-02 DIAGNOSIS — N184 Chronic kidney disease, stage 4 (severe): Secondary | ICD-10-CM | POA: Diagnosis not present

## 2022-09-04 DIAGNOSIS — R0609 Other forms of dyspnea: Secondary | ICD-10-CM | POA: Diagnosis not present

## 2022-09-04 DIAGNOSIS — M810 Age-related osteoporosis without current pathological fracture: Secondary | ICD-10-CM | POA: Diagnosis not present

## 2022-09-04 DIAGNOSIS — R410 Disorientation, unspecified: Secondary | ICD-10-CM | POA: Diagnosis not present

## 2022-09-04 DIAGNOSIS — E871 Hypo-osmolality and hyponatremia: Secondary | ICD-10-CM | POA: Diagnosis not present

## 2022-09-08 ENCOUNTER — Emergency Department (HOSPITAL_COMMUNITY)
Admission: EM | Admit: 2022-09-08 | Discharge: 2022-09-08 | Disposition: A | Discharge status: Home / Self Care | Payer: 59 | Attending: Emergency Medicine | Admitting: Emergency Medicine

## 2022-09-08 ENCOUNTER — Emergency Department (HOSPITAL_COMMUNITY): Payer: 59

## 2022-09-08 ENCOUNTER — Encounter (HOSPITAL_COMMUNITY): Payer: Self-pay | Admitting: Emergency Medicine

## 2022-09-08 ENCOUNTER — Other Ambulatory Visit: Payer: Self-pay

## 2022-09-08 DIAGNOSIS — E871 Hypo-osmolality and hyponatremia: Secondary | ICD-10-CM | POA: Diagnosis not present

## 2022-09-08 DIAGNOSIS — R0602 Shortness of breath: Secondary | ICD-10-CM | POA: Diagnosis not present

## 2022-09-08 DIAGNOSIS — J9811 Atelectasis: Secondary | ICD-10-CM | POA: Diagnosis not present

## 2022-09-08 DIAGNOSIS — J441 Chronic obstructive pulmonary disease with (acute) exacerbation: Secondary | ICD-10-CM | POA: Diagnosis not present

## 2022-09-08 DIAGNOSIS — I2510 Atherosclerotic heart disease of native coronary artery without angina pectoris: Secondary | ICD-10-CM | POA: Diagnosis not present

## 2022-09-08 DIAGNOSIS — R059 Cough, unspecified: Secondary | ICD-10-CM | POA: Diagnosis not present

## 2022-09-08 DIAGNOSIS — Z7951 Long term (current) use of inhaled steroids: Secondary | ICD-10-CM | POA: Insufficient documentation

## 2022-09-08 LAB — CBC
HCT: 37.2 % (ref 36.0–46.0)
Hemoglobin: 11.8 g/dL — ABNORMAL LOW (ref 12.0–15.0)
MCH: 30.3 pg (ref 26.0–34.0)
MCHC: 31.7 g/dL (ref 30.0–36.0)
MCV: 95.4 fL (ref 80.0–100.0)
Platelets: DECREASED 10*3/uL (ref 150–400)
RBC: 3.9 MIL/uL (ref 3.87–5.11)
RDW: 14.1 % (ref 11.5–15.5)
WBC: 7.2 10*3/uL (ref 4.0–10.5)
nRBC: 0 % (ref 0.0–0.2)

## 2022-09-08 LAB — BASIC METABOLIC PANEL
Anion gap: 10 (ref 5–15)
BUN: 29 mg/dL — ABNORMAL HIGH (ref 8–23)
CO2: 18 mmol/L — ABNORMAL LOW (ref 22–32)
Calcium: 9.3 mg/dL (ref 8.9–10.3)
Chloride: 105 mmol/L (ref 98–111)
Creatinine, Ser: 1.61 mg/dL — ABNORMAL HIGH (ref 0.44–1.00)
GFR, Estimated: 32 mL/min — ABNORMAL LOW (ref >60)
Glucose, Bld: 122 mg/dL — ABNORMAL HIGH (ref 70–99)
Potassium: 4.1 mmol/L (ref 3.5–5.1)
Sodium: 133 mmol/L — ABNORMAL LOW (ref 135–145)

## 2022-09-08 MED ORDER — DOXYCYCLINE HYCLATE 100 MG PO CAPS: 100.0000 mg | ORAL_CAPSULE | Freq: Two times a day (BID) | ORAL | 0 refills | Status: DC

## 2022-09-08 NOTE — Discharge Instructions (Addendum)
Your symptoms may be due to a bacterial or viral infection that is worsening your COPD. You have been prescribed an antibiotic. Take the complete course of this antibiotic.   Follow up with your PCP within the next 3 days.  Return to the ER if you experience any worsening shortness of breath, difficulty breathing, or other symptoms that are concerning to you.

## 2022-09-08 NOTE — ED Provider Notes (Signed)
[place] EMERGENCY DEPARTMENT AT [facility] Provider Note   CSN: [id number] Arrival date & time: 09/08/22  0741     History  Chief Complaint  Patient presents with   Shortness of Breath   Cough   Dizziness    Patricia Jackson is a 82 y.o. female with history of right lower lobe pneumonia in April 2024, COPD, CAD with stent placement, presenting with 6 days of productive cough and shortness of breath. Her symptoms remind her of when she had her pneumonia. At baseline is able to walk around the house without shortness of breath but now has shortness of breath when sitting up in bed.  She denies any bloody sputum, fever, chills. She has been continuing with her home medications without improvement in her symptoms.   Shortness of Breath Associated symptoms: cough   Cough Associated symptoms: shortness of breath   Dizziness Associated symptoms: shortness of breath        Home Medications Prior to Admission medications   Medication Sig Start Date End Date Taking? Authorizing Provider  doxycycline (VIBRAMYCIN) 100 MG capsule Take 1 capsule (100 mg total) by mouth 2 (two) times daily for 7 days. 09/08/22 09/15/22 Yes Arabella Merles, PA-C  acetaminophen (TYLENOL) 500 MG tablet Take by mouth. 03/21/17   [provider]  albuterol (PROVENTIL) (2.5 MG/3ML) 0.083% nebulizer solution Take 2.5 mg by nebulization every 6 (six) hours as needed for wheezing or shortness of breath.    [provider]  albuterol (VENTOLIN HFA) 108 (90 Base) MCG/ACT inhaler Inhale 1-2 puffs into the lungs every 6 (six) hours as needed for wheezing or shortness of breath. 03/02/22   Particia Nearing, PA-C  apixaban (ELIQUIS) 5 MG TABS tablet TAKE ONE TABLET (5MG  TOTAL) BY MOUTH TWOTIMES DAILY Patient taking differently: Take 5 mg by mouth 2 (two) times daily. 03/01/22   Quintella Reichert, MD  Cholecalciferol (VITAMIN D3) 50 MCG (2000 UT) TABS Take 2,000 Units by mouth daily.     [provider]  Copper Gluconate (COPPER CAPS PO) Take 6 mg by mouth daily.    [provider]  denosumab (PROLIA) 60 MG/ML SOSY injection Inject 60 mg into the skin every 6 (six) months. Patient not taking: Reported on 07/27/2022    [provider]  diclofenac Sodium (VOLTAREN) 1 % GEL APPLY 2 GRAMS TO THE AFFECTED AREA(S) BY TOPICAL ROUTE 4 TIMES PER DAY 06/02/22   [provider]  fluticasone (FLONASE) 50 MCG/ACT nasal spray Place 2 sprays into both nostrils daily. 06/23/21   Rodolph Bong, MD  guaiFENesin (MUCINEX) 600 MG 12 hr tablet Take 1 tablet (600 mg total) by mouth 2 (two) times daily as needed. 07/29/22   Kendell Bane, MD  levothyroxine (SYNTHROID) 50 MCG tablet Take 50 mcg by mouth daily before breakfast.  09/20/19   [provider]  metoprolol succinate (TOPROL XL) 25 MG 24 hr tablet Take 1 tablet (25 mg total) by mouth daily. 09/02/21   Quintella Reichert, MD  nitroGLYCERIN (NITROSTAT) 0.4 MG SL tablet Place 0.4 mg under the tongue every 5 (five) minutes as needed for chest pain. 06/09/19   [provider]  rosuvastatin (CRESTOR) 20 MG tablet Take 20 mg by mouth daily.  02/26/17   [provider]  sertraline (ZOLOFT) 25 MG tablet Take 25 mg by mouth at bedtime.    [provider]  TRELEGY ELLIPTA 100-62.5-25 MCG/ACT AEPB Inhale 1 puff into the lungs daily. 06/13/21  [provider]      Allergies    Sulfa antibiotics    Review of Systems   Review of Systems  Respiratory:  Positive for cough and shortness of breath.   Neurological:  Positive for dizziness.    Physical Exam Updated Vital Signs BP 133/71   Pulse 74   Temp 98.8 F (37.1 C) (Oral)   Resp 16   SpO2 97%  Physical Exam Constitutional:      General: She is not in acute distress.    Appearance: Normal appearance.  HENT:     Head: Normocephalic and atraumatic.  Cardiovascular:     Rate and Rhythm: Normal rate and regular  rhythm.     Heart sounds: Normal heart sounds. No murmur heard. Pulmonary:     Effort: Pulmonary effort is normal. No respiratory distress.     Comments: Cough Musculoskeletal:        General: No swelling. Normal range of motion.     Cervical back: Normal range of motion.     Right lower leg: No edema.     Left lower leg: No edema.  Skin:    General: Skin is warm and dry.  Neurological:     General: No focal deficit present.     Mental Status: She is alert and oriented to person, place, and time.  Psychiatric:        Mood and Affect: Mood normal.     ED Results / Procedures / Treatments   Labs (all labs ordered are listed, but only abnormal results are displayed) Labs Reviewed  BASIC METABOLIC PANEL - Abnormal; Notable for the following components:      Result Value   Sodium 133 (*)    CO2 18 (*)    Glucose, Bld 122 (*)    BUN 29 (*)    Creatinine, Ser 1.61 (*)    GFR, Estimated 32 (*)    All other components within normal limits  CBC - Abnormal; Notable for the following components:   Hemoglobin 11.8 (*)    All other components within normal limits  URINALYSIS, ROUTINE W REFLEX MICROSCOPIC    EKG EKG Interpretation  Date/Time:  Wednesday September 08 2022 07:59:37 EDT Ventricular Rate:  73 PR Interval:  154 QRS Duration: 86 QT Interval:  514 QTC Calculation: 567 R Axis:   72 Text Interpretation: Sinus rhythm Prolonged QT interval Since last tracing QT prolonged Abnormal ekg Confirmed by Eber Hong ([id number]) on 09/08/2022 8:08:59 AM  Radiology DG Chest 2 View  Result Date: 09/08/2022 CLINICAL DATA:  Cough, shortness of breath. EXAM: CHEST - 2 VIEW COMPARISON:  July 26, 2022. FINDINGS: The heart size and mediastinal contours are within normal limits. Minimal bibasilar subsegmental atelectasis is noted. The visualized skeletal structures are unremarkable. IMPRESSION: Minimal bibasilar subsegmental atelectasis. Electronically Signed   By: Lupita Raider M.D.   On:  09/08/2022 09:30    Procedures Procedures    Medications Ordered in ED Medications - No data to display  ED Course/ Medical Decision Making/ A&P Clinical Course as of 09/08/22 1022  Wed Sep 08, 2022  0831 CBC with mild hyponatremia,  Cr and GFR at baseline  [AF]    Clinical Course User Index [AF] Arabella Merles, PA-C                             Medical Decision Making Amount and/or Complexity of Data Reviewed Labs: ordered. Radiology: ordered.  Risk Prescription drug management.   This patient presents to the ED for concern of 6 days of shortness of breath and cough. Ddx includes pneumonia, upper respiratory infection.  Heart failure unlikely given recent echo with EF of 70%.     Additional history obtained:  Additional history obtained from her grandson Thayer Ohm who was present in the room during initial exam. External records from outside source obtained and reviewed including discharge summary from her visit in April 2024 for right lower lobe pneumonia.   Lab Tests:  I Ordered, and personally interpreted labs.  The pertinent results include:  WBC within normal limits Mild hyponatremia at 133   Imaging Studies ordered:  I ordered imaging studies including chest x-ray I independently visualized and interpreted imaging which showed no focal consolidation or infiltrates   I agree with the radiologist interpretation of minimal bibasilar subsegmental atelectasis   Medicines ordered and prescription drug management:  No medications given in the ER I have reviewed the patients home medicines and have made adjustments as needed   Problem List / ED Course:  Shortness of Breath CXR ordered with no infiltrates or consolidations seen, making pneumonia less likely.  Labs with WBC count within normal limits, less concerned for infectious process  Patient afebrile and stating well on room air Discharged with 7 day course of doxycyline to cover for possible  bacterial infection   Social Determinants of Health:  None pertinent           Final Clinical Impression(s) / ED Diagnoses Final diagnoses:  COPD exacerbation (HCC)    Rx / DC Orders ED Discharge Orders          Ordered    doxycycline (VIBRAMYCIN) 100 MG capsule  2 times daily        09/08/22 1009              Arabella Merles, PA-C 09/08/22 1023    Eber Hong, MD 09/09/22 [phone]

## 2022-09-08 NOTE — ED Triage Notes (Signed)
Pt c/o cough/shob x 1 week. Dizziness with coughing x 2 days. Pt a/o. Gen weakness noted. Color wnl. Dry cough noted. Non diaphoretic. Pt denies pain. Mild labored breathing noted.

## 2022-09-08 NOTE — ED Notes (Signed)
Pt informed of need for urine specimen  

## 2022-09-12 ENCOUNTER — Emergency Department (HOSPITAL_COMMUNITY): Payer: 59

## 2022-09-12 ENCOUNTER — Encounter (HOSPITAL_COMMUNITY): Payer: Self-pay | Admitting: Emergency Medicine

## 2022-09-12 ENCOUNTER — Inpatient Hospital Stay (HOSPITAL_COMMUNITY)
Admission: EM | Admit: 2022-09-12 | Discharge: 2022-09-14 | DRG: 194 | Disposition: A | Discharge status: Home Health | Payer: 59 | Attending: Internal Medicine | Admitting: Internal Medicine

## 2022-09-12 ENCOUNTER — Other Ambulatory Visit: Payer: Self-pay

## 2022-09-12 DIAGNOSIS — Z8249 Family history of ischemic heart disease and other diseases of the circulatory system: Secondary | ICD-10-CM | POA: Diagnosis not present

## 2022-09-12 DIAGNOSIS — Z825 Family history of asthma and other chronic lower respiratory diseases: Secondary | ICD-10-CM | POA: Diagnosis not present

## 2022-09-12 DIAGNOSIS — Z853 Personal history of malignant neoplasm of breast: Secondary | ICD-10-CM

## 2022-09-12 DIAGNOSIS — W06XXXA Fall from bed, initial encounter: Secondary | ICD-10-CM | POA: Diagnosis present

## 2022-09-12 DIAGNOSIS — I2510 Atherosclerotic heart disease of native coronary artery without angina pectoris: Secondary | ICD-10-CM | POA: Diagnosis not present

## 2022-09-12 DIAGNOSIS — E785 Hyperlipidemia, unspecified: Secondary | ICD-10-CM | POA: Diagnosis present

## 2022-09-12 DIAGNOSIS — J440 Chronic obstructive pulmonary disease with acute lower respiratory infection: Secondary | ICD-10-CM | POA: Diagnosis present

## 2022-09-12 DIAGNOSIS — N179 Acute kidney failure, unspecified: Secondary | ICD-10-CM | POA: Diagnosis present

## 2022-09-12 DIAGNOSIS — E860 Dehydration: Secondary | ICD-10-CM | POA: Diagnosis not present

## 2022-09-12 DIAGNOSIS — Z87891 Personal history of nicotine dependence: Secondary | ICD-10-CM

## 2022-09-12 DIAGNOSIS — R55 Syncope and collapse: Secondary | ICD-10-CM | POA: Diagnosis not present

## 2022-09-12 DIAGNOSIS — Z7901 Long term (current) use of anticoagulants: Secondary | ICD-10-CM

## 2022-09-12 DIAGNOSIS — I10 Essential (primary) hypertension: Secondary | ICD-10-CM | POA: Diagnosis not present

## 2022-09-12 DIAGNOSIS — Z7951 Long term (current) use of inhaled steroids: Secondary | ICD-10-CM | POA: Diagnosis not present

## 2022-09-12 DIAGNOSIS — Z79899 Other long term (current) drug therapy: Secondary | ICD-10-CM

## 2022-09-12 DIAGNOSIS — E039 Hypothyroidism, unspecified: Secondary | ICD-10-CM | POA: Diagnosis present

## 2022-09-12 DIAGNOSIS — J9811 Atelectasis: Secondary | ICD-10-CM | POA: Diagnosis not present

## 2022-09-12 DIAGNOSIS — J441 Chronic obstructive pulmonary disease with (acute) exacerbation: Secondary | ICD-10-CM | POA: Diagnosis present

## 2022-09-12 DIAGNOSIS — J189 Pneumonia, unspecified organism: Secondary | ICD-10-CM | POA: Diagnosis not present

## 2022-09-12 DIAGNOSIS — E782 Mixed hyperlipidemia: Secondary | ICD-10-CM | POA: Diagnosis present

## 2022-09-12 DIAGNOSIS — Z1152 Encounter for screening for COVID-19: Secondary | ICD-10-CM | POA: Diagnosis not present

## 2022-09-12 DIAGNOSIS — Z882 Allergy status to sulfonamides status: Secondary | ICD-10-CM

## 2022-09-12 DIAGNOSIS — I252 Old myocardial infarction: Secondary | ICD-10-CM

## 2022-09-12 DIAGNOSIS — S0990XA Unspecified injury of head, initial encounter: Secondary | ICD-10-CM | POA: Diagnosis not present

## 2022-09-12 DIAGNOSIS — Y92013 Bedroom of single-family (private) house as the place of occurrence of the external cause: Secondary | ICD-10-CM

## 2022-09-12 DIAGNOSIS — N184 Chronic kidney disease, stage 4 (severe): Secondary | ICD-10-CM | POA: Diagnosis present

## 2022-09-12 DIAGNOSIS — R0602 Shortness of breath: Secondary | ICD-10-CM | POA: Diagnosis not present

## 2022-09-12 DIAGNOSIS — Z955 Presence of coronary angioplasty implant and graft: Secondary | ICD-10-CM

## 2022-09-12 DIAGNOSIS — I4892 Unspecified atrial flutter: Secondary | ICD-10-CM | POA: Diagnosis not present

## 2022-09-12 DIAGNOSIS — S01551A Open bite of lip, initial encounter: Secondary | ICD-10-CM | POA: Diagnosis present

## 2022-09-12 DIAGNOSIS — Z79890 Hormone replacement therapy (postmenopausal): Secondary | ICD-10-CM

## 2022-09-12 LAB — CBC WITH DIFFERENTIAL/PLATELET
Abs Immature Granulocytes: 0.02 10*3/uL (ref 0.00–0.07)
Basophils Absolute: 0 10*3/uL (ref 0.0–0.1)
Basophils Relative: 1 %
Eosinophils Absolute: 0.1 10*3/uL (ref 0.0–0.5)
Eosinophils Relative: 2 %
HCT: 35.7 % — ABNORMAL LOW (ref 36.0–46.0)
Hemoglobin: 11.6 g/dL — ABNORMAL LOW (ref 12.0–15.0)
Immature Granulocytes: 1 %
Lymphocytes Relative: 31 %
Lymphs Abs: 1.3 10*3/uL (ref 0.7–4.0)
MCH: 30.9 pg (ref 26.0–34.0)
MCHC: 32.5 g/dL (ref 30.0–36.0)
MCV: 94.9 fL (ref 80.0–100.0)
Monocytes Absolute: 0.7 10*3/uL (ref 0.1–1.0)
Monocytes Relative: 16 %
Neutro Abs: 2.1 10*3/uL (ref 1.7–7.7)
Neutrophils Relative %: 49 %
Platelets: 93 10*3/uL — ABNORMAL LOW (ref 150–400)
RBC: 3.76 MIL/uL — ABNORMAL LOW (ref 3.87–5.11)
RDW: 13.7 % (ref 11.5–15.5)
WBC: 4.1 10*3/uL (ref 4.0–10.5)
nRBC: 0 % (ref 0.0–0.2)

## 2022-09-12 LAB — COMPREHENSIVE METABOLIC PANEL
ALT: 15 U/L (ref 0–44)
AST: 33 U/L (ref 15–41)
Albumin: 3.3 g/dL — ABNORMAL LOW (ref 3.5–5.0)
Alkaline Phosphatase: 78 U/L (ref 38–126)
Anion gap: 10 (ref 5–15)
BUN: 40 mg/dL — ABNORMAL HIGH (ref 8–23)
CO2: 15 mmol/L — ABNORMAL LOW (ref 22–32)
Calcium: 9.2 mg/dL (ref 8.9–10.3)
Chloride: 110 mmol/L (ref 98–111)
Creatinine, Ser: 1.71 mg/dL — ABNORMAL HIGH (ref 0.44–1.00)
GFR, Estimated: 30 mL/min — ABNORMAL LOW (ref >60)
Glucose, Bld: 90 mg/dL (ref 70–99)
Potassium: 4 mmol/L (ref 3.5–5.1)
Sodium: 135 mmol/L (ref 135–145)
Total Bilirubin: 0.5 mg/dL (ref 0.3–1.2)
Total Protein: 6.3 g/dL — ABNORMAL LOW (ref 6.5–8.1)

## 2022-09-12 LAB — URINALYSIS, ROUTINE W REFLEX MICROSCOPIC
Bilirubin Urine: NEGATIVE
Glucose, UA: NEGATIVE mg/dL
Hgb urine dipstick: NEGATIVE
Ketones, ur: NEGATIVE mg/dL
Leukocytes,Ua: NEGATIVE
Nitrite: NEGATIVE
Protein, ur: NEGATIVE mg/dL
Specific Gravity, Urine: 1.014 (ref 1.005–1.030)
pH: 5 (ref 5.0–8.0)

## 2022-09-12 LAB — TROPONIN I (HIGH SENSITIVITY)
Troponin I (High Sensitivity): 9 ng/L (ref <18)
Troponin I (High Sensitivity): 15 ng/L (ref <18)

## 2022-09-12 LAB — BRAIN NATRIURETIC PEPTIDE: B Natriuretic Peptide: 190 pg/mL — ABNORMAL HIGH (ref 0.0–100.0)

## 2022-09-12 LAB — D-DIMER, QUANTITATIVE: D-Dimer, Quant: 0.81 ug/mL-FEU — ABNORMAL HIGH (ref 0.00–0.50)

## 2022-09-12 MED ORDER — ROSUVASTATIN CALCIUM 20 MG PO TABS
20.0000 mg | ORAL_TABLET | Freq: Every day | ORAL | Status: DC
  Administered 2022-09-13 – 2022-09-14 (×2): 20 mg via ORAL
  Filled 2022-09-12 (×2): qty 1

## 2022-09-12 MED ORDER — LEVOTHYROXINE SODIUM 50 MCG PO TABS
50.0000 ug | ORAL_TABLET | Freq: Every day | ORAL | Status: DC
  Administered 2022-09-13 – 2022-09-14 (×2): 50 ug via ORAL
  Filled 2022-09-12 (×2): qty 1

## 2022-09-12 MED ORDER — ALBUTEROL SULFATE (2.5 MG/3ML) 0.083% IN NEBU
2.5000 mg | INHALATION_SOLUTION | RESPIRATORY_TRACT | Status: DC | PRN
  Administered 2022-09-13: 2.5 mg via RESPIRATORY_TRACT
  Filled 2022-09-12: qty 3

## 2022-09-12 MED ORDER — APIXABAN 5 MG PO TABS
5.0000 mg | ORAL_TABLET | Freq: Two times a day (BID) | ORAL | Status: DC
  Administered 2022-09-13 – 2022-09-14 (×4): 5 mg via ORAL
  Filled 2022-09-12 (×4): qty 1

## 2022-09-12 MED ORDER — ONDANSETRON HCL 4 MG/2ML IJ SOLN: 4.0000 mg | Freq: Four times a day (QID) | INTRAMUSCULAR | Status: DC | PRN

## 2022-09-12 MED ORDER — SODIUM CHLORIDE 0.9 % IV SOLN
2.0000 g | Freq: Once | INTRAVENOUS | Status: AC
  Administered 2022-09-13: 2 g via INTRAVENOUS
  Filled 2022-09-12: qty 20

## 2022-09-12 MED ORDER — SODIUM CHLORIDE 0.9 % IV SOLN
500.0000 mg | INTRAVENOUS | Status: DC
  Administered 2022-09-13: 500 mg via INTRAVENOUS
  Filled 2022-09-12: qty 5

## 2022-09-12 MED ORDER — ACETAMINOPHEN 650 MG RE SUPP: 650.0000 mg | Freq: Four times a day (QID) | RECTAL | Status: DC | PRN

## 2022-09-12 MED ORDER — GUAIFENESIN ER 600 MG PO TB12
600.0000 mg | ORAL_TABLET | Freq: Two times a day (BID) | ORAL | Status: DC
  Administered 2022-09-13 – 2022-09-14 (×4): 600 mg via ORAL
  Filled 2022-09-12 (×4): qty 1

## 2022-09-12 MED ORDER — IOHEXOL 350 MG/ML SOLN
60.0000 mL | Freq: Once | INTRAVENOUS | Status: AC | PRN
  Administered 2022-09-12: 60 mL via INTRAVENOUS

## 2022-09-12 MED ORDER — METOPROLOL SUCCINATE ER 25 MG PO TB24
25.0000 mg | ORAL_TABLET | Freq: Every day | ORAL | Status: DC
  Administered 2022-09-13 – 2022-09-14 (×2): 25 mg via ORAL
  Filled 2022-09-12 (×2): qty 1

## 2022-09-12 MED ORDER — SERTRALINE HCL 50 MG PO TABS
25.0000 mg | ORAL_TABLET | Freq: Every day | ORAL | Status: DC
  Administered 2022-09-13 (×2): 25 mg via ORAL
  Filled 2022-09-12 (×2): qty 1

## 2022-09-12 MED ORDER — SODIUM CHLORIDE 0.9 % IV SOLN
2.0000 g | INTRAVENOUS | Status: DC
  Administered 2022-09-13: 2 g via INTRAVENOUS
  Filled 2022-09-12: qty 20

## 2022-09-12 MED ORDER — OXYCODONE HCL 5 MG PO TABS: 5.0000 mg | ORAL_TABLET | ORAL | Status: DC | PRN

## 2022-09-12 MED ORDER — ACETAMINOPHEN 325 MG PO TABS
650.0000 mg | ORAL_TABLET | Freq: Four times a day (QID) | ORAL | Status: DC | PRN
  Administered 2022-09-14: 650 mg via ORAL
  Filled 2022-09-12: qty 2

## 2022-09-12 MED ORDER — ONDANSETRON HCL 4 MG PO TABS: 4.0000 mg | ORAL_TABLET | Freq: Four times a day (QID) | ORAL | Status: DC | PRN

## 2022-09-12 MED ORDER — SODIUM CHLORIDE 0.9 % IV SOLN
500.0000 mg | Freq: Once | INTRAVENOUS | Status: AC
  Administered 2022-09-13: 500 mg via INTRAVENOUS
  Filled 2022-09-12: qty 5

## 2022-09-12 MED ORDER — SODIUM CHLORIDE 0.9 % IV BOLUS
500.0000 mL | Freq: Once | INTRAVENOUS | Status: AC
  Administered 2022-09-12: 500 mL via INTRAVENOUS

## 2022-09-12 NOTE — ED Triage Notes (Signed)
Pt states she fell off her bed x1 hour ago. Denies hitting her head. Also c/o of feeling SOB.

## 2022-09-12 NOTE — ED Provider Notes (Signed)
[place] EMERGENCY DEPARTMENT AT [facility] Provider Note   CSN: [id number] Arrival date & time: 09/12/22  1722     History {Add pertinent medical, surgical, social history, OB history to HPI:1} Chief Complaint  Patient presents with   Shortness of Breath    Patricia Jackson is a 82 y.o. female.  Patient with cough and shortness of breath.  She was weak today and fell out of bed.  Then she passed out on the way to the emergency department.     Shortness of Breath      Home Medications Prior to Admission medications   Medication Sig Start Date End Date Taking? Authorizing Provider  acetaminophen (TYLENOL) 500 MG tablet Take by mouth. 03/21/17   [provider]  albuterol (PROVENTIL) (2.5 MG/3ML) 0.083% nebulizer solution Take 2.5 mg by nebulization every 6 (six) hours as needed for wheezing or shortness of breath.    [provider]  albuterol (VENTOLIN HFA) 108 (90 Base) MCG/ACT inhaler Inhale 1-2 puffs into the lungs every 6 (six) hours as needed for wheezing or shortness of breath. 03/02/22   Particia Nearing, PA-C  apixaban (ELIQUIS) 5 MG TABS tablet TAKE ONE TABLET (5MG  TOTAL) BY MOUTH TWOTIMES DAILY Patient taking differently: Take 5 mg by mouth 2 (two) times daily. 03/01/22   Quintella Reichert, MD  Cholecalciferol (VITAMIN D3) 50 MCG (2000 UT) TABS Take 2,000 Units by mouth daily.    [provider]  Copper Gluconate (COPPER CAPS PO) Take 6 mg by mouth daily.    [provider]  denosumab (PROLIA) 60 MG/ML SOSY injection Inject 60 mg into the skin every 6 (six) months. Patient not taking: Reported on 07/27/2022    [provider]  diclofenac Sodium (VOLTAREN) 1 % GEL APPLY 2 GRAMS TO THE AFFECTED AREA(S) BY TOPICAL ROUTE 4 TIMES PER DAY 06/02/22   [provider]  doxycycline (VIBRAMYCIN) 100 MG capsule Take 1 capsule (100 mg total) by mouth 2 (two) times daily for 7 days. 09/08/22 09/15/22  Arabella Merles,  PA-C  fluticasone (FLONASE) 50 MCG/ACT nasal spray Place 2 sprays into both nostrils daily. 06/23/21   Rodolph Bong, MD  guaiFENesin (MUCINEX) 600 MG 12 hr tablet Take 1 tablet (600 mg total) by mouth 2 (two) times daily as needed. 07/29/22   Kendell Bane, MD  levothyroxine (SYNTHROID) 50 MCG tablet Take 50 mcg by mouth daily before breakfast.  09/20/19   [provider]  metoprolol succinate (TOPROL XL) 25 MG 24 hr tablet Take 1 tablet (25 mg total) by mouth daily. 09/02/21   Quintella Reichert, MD  nitroGLYCERIN (NITROSTAT) 0.4 MG SL tablet Place 0.4 mg under the tongue every 5 (five) minutes as needed for chest pain. 06/09/19   [provider]  rosuvastatin (CRESTOR) 20 MG tablet Take 20 mg by mouth daily.  02/26/17   [provider]  sertraline (ZOLOFT) 25 MG tablet Take 25 mg by mouth at bedtime.    [provider]  TRELEGY ELLIPTA 100-62.5-25 MCG/ACT AEPB Inhale 1 puff into the lungs daily. 06/13/21   [provider]      Allergies    Sulfa antibiotics    Review of Systems   Review of Systems  Respiratory:  Positive for shortness of breath.     Physical Exam Updated Vital Signs BP (!) 172/85   Pulse 71   Temp 98 F (36.7 C)   Resp 18   Ht 5\' 7"  (1.702 m)  Wt 73 kg   SpO2 95%   BMI 25.22 kg/m  Physical Exam  ED Results / Procedures / Treatments   Labs (all labs ordered are listed, but only abnormal results are displayed) Labs Reviewed  CBC WITH DIFFERENTIAL/PLATELET - Abnormal; Notable for the following components:      Result Value   RBC 3.76 (*)    Hemoglobin 11.6 (*)    HCT 35.7 (*)    Platelets 93 (*)    All other components within normal limits  COMPREHENSIVE METABOLIC PANEL - Abnormal; Notable for the following components:   CO2 15 (*)    BUN 40 (*)    Creatinine, Ser 1.71 (*)    Total Protein 6.3 (*)    Albumin 3.3 (*)    GFR, Estimated 30 (*)    All other components within normal limits  D-DIMER,  QUANTITATIVE - Abnormal; Notable for the following components:   D-Dimer, Quant 0.81 (*)    All other components within normal limits  BRAIN NATRIURETIC PEPTIDE - Abnormal; Notable for the following components:   B Natriuretic Peptide 190.0 (*)    All other components within normal limits  URINALYSIS, ROUTINE W REFLEX MICROSCOPIC  TROPONIN I (HIGH SENSITIVITY)  TROPONIN I (HIGH SENSITIVITY)    EKG EKG Interpretation  Date/Time:  Sunday September 12 2022 17:28:51 EDT Ventricular Rate:  61 PR Interval:    QRS Duration: 94 QT Interval:  474 QTC Calculation: 478 R Axis:   84 Text Interpretation: Normal sinus rhythm Borderline right axis deviation Left ventricular hypertrophy Confirmed by Bethann Berkshire [phone]) on 09/12/2022 9:59:06 PM  Radiology CT Angio Chest PE W and/or Wo Contrast  Result Date: 09/12/2022 CLINICAL DATA:  Fall out of bed 1 hour ago. Shortness of breath. PE suspected EXAM: CT ANGIOGRAPHY CHEST WITH CONTRAST TECHNIQUE: Multidetector CT imaging of the chest was performed using the standard protocol during bolus administration of intravenous contrast. Multiplanar CT image reconstructions and MIPs were obtained to evaluate the vascular anatomy. RADIATION DOSE REDUCTION: This exam was performed according to the departmental dose-optimization program which includes automated exposure control, adjustment of the mA and/or kV according to patient size and/or use of iterative reconstruction technique. CONTRAST:  60mL OMNIPAQUE IOHEXOL 350 MG/ML SOLN COMPARISON:  Chest radiograph 09/12/2022 and CT chest 02/09/2021 FINDINGS: Cardiovascular: Cardiomegaly. No pericardial effusion. Coronary artery and aortic atherosclerotic calcification. Satisfactory opacification of the pulmonary arteries to the segmental level. No pulmonary embolism. Mediastinum/Nodes: Small hiatal hernia. Trachea and esophagus are otherwise unremarkable. No thoracic adenopathy. Lungs/Pleura: Advanced emphysema. Diffuse  interlobular septal thickening. Bronchial wall thickening, mucous plugging, and bronchiolectasis in the left lower lobe with centrilobular ground-glass nodules and tree-in-bud opacities. No pleural effusion or pneumothorax. Upper Abdomen: No acute abnormality. Musculoskeletal: No acute fracture. Review of the MIP images confirms the above findings. IMPRESSION: 1. No evidence of acute pulmonary embolism. 2. Small airway infection/inflammation in the left lower lobe. 3. Cardiomegaly with interstitial pulmonary edema superimposed on a background of emphysema. Aortic Atherosclerosis (ICD10-I70.0) and Emphysema (ICD10-J43.9). Electronically Signed   By: Minerva Fester M.D.   On: 09/12/2022 21:33   CT Head Wo Contrast  Result Date: 09/12/2022 CLINICAL DATA:  Head trauma, intracranial arterial injury suspected. EXAM: CT HEAD WITHOUT CONTRAST TECHNIQUE: Contiguous axial images were obtained from the base of the skull through the vertex without intravenous contrast. RADIATION DOSE REDUCTION: This exam was performed according to the departmental dose-optimization program which includes automated exposure control, adjustment of the mA and/or kV according to patient size  and/or use of iterative reconstruction technique. COMPARISON:  Head CT 07/26/2022. FINDINGS: Brain: No acute hemorrhage. Unchanged mild chronic small-vessel disease. Cortical gray-white differentiation is otherwise preserved. Prominence of the ventricles and sulci within expected range for age. No hydrocephalus or extra-axial collection. No mass effect or midline shift. Vascular: No hyperdense vessel or unexpected calcification. Skull: No calvarial fracture or suspicious bone lesion. Skull base is unremarkable. Sinuses/Orbits: Unremarkable. Other: None. IMPRESSION: 1. No acute intracranial abnormality. 2. Unchanged mild chronic small-vessel disease. Electronically Signed   By: Orvan Falconer M.D.   On: 09/12/2022 19:01   DG Chest Port 1 View  Result  Date: 09/12/2022 CLINICAL DATA:  Shortness of breath EXAM: PORTABLE CHEST 1 VIEW COMPARISON:  X-ray 09/08/2022 and older FINDINGS: Hyperinflation. Mild basilar atelectasis is increased. Chronic interstitial lung changes. No consolidation, pneumothorax or effusion. Stable cardiopericardial silhouette with calcified aorta. Overlapping cardiac leads. Osteopenia. IMPRESSION: Hyperinflation. Increasing but mild basilar atelectasis. Chronic interstitial lung changes Electronically Signed   By: Karen Kays M.D.   On: 09/12/2022 17:58    Procedures Procedures  {Document cardiac monitor, telemetry assessment procedure when appropriate:1}  Medications Ordered in ED Medications  cefTRIAXone (ROCEPHIN) 2 g in sodium chloride 0.9 % 100 mL IVPB (has no administration in time range)  azithromycin (ZITHROMAX) 500 mg in sodium chloride 0.9 % 250 mL IVPB (has no administration in time range)  sodium chloride 0.9 % bolus 500 mL (500 mLs Intravenous Bolus 09/12/22 1734)  iohexol (OMNIPAQUE) 350 MG/ML injection 60 mL (60 mLs Intravenous Contrast Given 09/12/22 2110)    ED Course/ Medical Decision Making/ A&P   {Patient with possible pneumonia and left lower lobe seen on CT.  Grandson states patient is more confused than normal and is slurring her words some. Click here for ABCD2, HEART and other calculatorsREFRESH Note before signing :1}                          Medical Decision Making Amount and/or Complexity of Data Reviewed Labs: ordered. Radiology: ordered. ECG/medicine tests: ordered.  Risk Prescription drug management. Decision regarding hospitalization.   Patient with weakness, syncope and left lower lobe infiltrate.  She will be admitted to medicine  {Document critical care time when appropriate:1} {Document review of labs and clinical decision tools ie heart score, Chads2Vasc2 etc:1}  {Document your independent review of radiology images, and any outside records:1} {Document your discussion with  family members, caretakers, and with consultants:1} {Document social determinants of health affecting pt's care:1} {Document your decision making why or why not admission, treatments were needed:1} Final Clinical Impression(s) / ED Diagnoses Final diagnoses:  None    Rx / DC Orders ED Discharge Orders     None

## 2022-09-13 ENCOUNTER — Other Ambulatory Visit (HOSPITAL_COMMUNITY): Payer: Self-pay | Admitting: *Deleted

## 2022-09-13 ENCOUNTER — Observation Stay (HOSPITAL_COMMUNITY): Payer: 59

## 2022-09-13 DIAGNOSIS — Z853 Personal history of malignant neoplasm of breast: Secondary | ICD-10-CM | POA: Diagnosis not present

## 2022-09-13 DIAGNOSIS — Y92013 Bedroom of single-family (private) house as the place of occurrence of the external cause: Secondary | ICD-10-CM | POA: Diagnosis not present

## 2022-09-13 DIAGNOSIS — R0602 Shortness of breath: Secondary | ICD-10-CM | POA: Diagnosis present

## 2022-09-13 DIAGNOSIS — I252 Old myocardial infarction: Secondary | ICD-10-CM | POA: Diagnosis not present

## 2022-09-13 DIAGNOSIS — I10 Essential (primary) hypertension: Secondary | ICD-10-CM

## 2022-09-13 DIAGNOSIS — J189 Pneumonia, unspecified organism: Secondary | ICD-10-CM | POA: Diagnosis present

## 2022-09-13 DIAGNOSIS — Z79890 Hormone replacement therapy (postmenopausal): Secondary | ICD-10-CM | POA: Diagnosis not present

## 2022-09-13 DIAGNOSIS — E039 Hypothyroidism, unspecified: Secondary | ICD-10-CM | POA: Diagnosis not present

## 2022-09-13 DIAGNOSIS — R55 Syncope and collapse: Secondary | ICD-10-CM | POA: Diagnosis not present

## 2022-09-13 DIAGNOSIS — N179 Acute kidney failure, unspecified: Secondary | ICD-10-CM

## 2022-09-13 DIAGNOSIS — I4892 Unspecified atrial flutter: Secondary | ICD-10-CM

## 2022-09-13 DIAGNOSIS — Z8249 Family history of ischemic heart disease and other diseases of the circulatory system: Secondary | ICD-10-CM | POA: Diagnosis not present

## 2022-09-13 DIAGNOSIS — Z79899 Other long term (current) drug therapy: Secondary | ICD-10-CM | POA: Diagnosis not present

## 2022-09-13 DIAGNOSIS — Z955 Presence of coronary angioplasty implant and graft: Secondary | ICD-10-CM | POA: Diagnosis not present

## 2022-09-13 DIAGNOSIS — I2510 Atherosclerotic heart disease of native coronary artery without angina pectoris: Secondary | ICD-10-CM | POA: Diagnosis not present

## 2022-09-13 DIAGNOSIS — E785 Hyperlipidemia, unspecified: Secondary | ICD-10-CM

## 2022-09-13 DIAGNOSIS — Z1152 Encounter for screening for COVID-19: Secondary | ICD-10-CM | POA: Diagnosis not present

## 2022-09-13 DIAGNOSIS — Z7951 Long term (current) use of inhaled steroids: Secondary | ICD-10-CM | POA: Diagnosis not present

## 2022-09-13 DIAGNOSIS — S01551A Open bite of lip, initial encounter: Secondary | ICD-10-CM | POA: Diagnosis present

## 2022-09-13 DIAGNOSIS — Z882 Allergy status to sulfonamides status: Secondary | ICD-10-CM | POA: Diagnosis not present

## 2022-09-13 DIAGNOSIS — J441 Chronic obstructive pulmonary disease with (acute) exacerbation: Secondary | ICD-10-CM | POA: Diagnosis not present

## 2022-09-13 DIAGNOSIS — Z7901 Long term (current) use of anticoagulants: Secondary | ICD-10-CM | POA: Diagnosis not present

## 2022-09-13 DIAGNOSIS — J440 Chronic obstructive pulmonary disease with acute lower respiratory infection: Secondary | ICD-10-CM | POA: Diagnosis present

## 2022-09-13 DIAGNOSIS — Z825 Family history of asthma and other chronic lower respiratory diseases: Secondary | ICD-10-CM | POA: Diagnosis not present

## 2022-09-13 DIAGNOSIS — W06XXXA Fall from bed, initial encounter: Secondary | ICD-10-CM | POA: Diagnosis present

## 2022-09-13 DIAGNOSIS — E860 Dehydration: Secondary | ICD-10-CM | POA: Diagnosis present

## 2022-09-13 DIAGNOSIS — Z87891 Personal history of nicotine dependence: Secondary | ICD-10-CM | POA: Diagnosis not present

## 2022-09-13 LAB — COMPREHENSIVE METABOLIC PANEL
ALT: 15 U/L (ref 0–44)
AST: 29 U/L (ref 15–41)
Albumin: 3.2 g/dL — ABNORMAL LOW (ref 3.5–5.0)
Alkaline Phosphatase: 73 U/L (ref 38–126)
Anion gap: 8 (ref 5–15)
BUN: 35 mg/dL — ABNORMAL HIGH (ref 8–23)
CO2: 19 mmol/L — ABNORMAL LOW (ref 22–32)
Calcium: 9.1 mg/dL (ref 8.9–10.3)
Chloride: 111 mmol/L (ref 98–111)
Creatinine, Ser: 1.54 mg/dL — ABNORMAL HIGH (ref 0.44–1.00)
GFR, Estimated: 34 mL/min — ABNORMAL LOW (ref >60)
Glucose, Bld: 86 mg/dL (ref 70–99)
Potassium: 4.8 mmol/L (ref 3.5–5.1)
Sodium: 138 mmol/L (ref 135–145)
Total Bilirubin: 0.5 mg/dL (ref 0.3–1.2)
Total Protein: 6.2 g/dL — ABNORMAL LOW (ref 6.5–8.1)

## 2022-09-13 LAB — ECHOCARDIOGRAM COMPLETE
AR max vel: 2.18 cm2
AV Area VTI: 1.89 cm2
AV Area mean vel: 1.88 cm2
AV Mean grad: 3.6 mmHg
AV Peak grad: 7.2 mmHg
Ao pk vel: 1.34 m/s
Area-P 1/2: 2.05 cm2
Height: 67 in
S' Lateral: 2.7 cm
Weight: 2172.85 oz

## 2022-09-13 LAB — CBC WITH DIFFERENTIAL/PLATELET
Abs Immature Granulocytes: 0.02 10*3/uL (ref 0.00–0.07)
Basophils Absolute: 0 10*3/uL (ref 0.0–0.1)
Basophils Relative: 1 %
Eosinophils Absolute: 0 10*3/uL (ref 0.0–0.5)
Eosinophils Relative: 1 %
HCT: 33.8 % — ABNORMAL LOW (ref 36.0–46.0)
Hemoglobin: 10.9 g/dL — ABNORMAL LOW (ref 12.0–15.0)
Immature Granulocytes: 1 %
Lymphocytes Relative: 26 %
Lymphs Abs: 0.9 10*3/uL (ref 0.7–4.0)
MCH: 30.6 pg (ref 26.0–34.0)
MCHC: 32.2 g/dL (ref 30.0–36.0)
MCV: 94.9 fL (ref 80.0–100.0)
Monocytes Absolute: 0.4 10*3/uL (ref 0.1–1.0)
Monocytes Relative: 12 %
Neutro Abs: 2.1 10*3/uL (ref 1.7–7.7)
Neutrophils Relative %: 59 %
Platelets: 74 10*3/uL — ABNORMAL LOW (ref 150–400)
RBC: 3.56 MIL/uL — ABNORMAL LOW (ref 3.87–5.11)
RDW: 14 % (ref 11.5–15.5)
WBC: 3.6 10*3/uL — ABNORMAL LOW (ref 4.0–10.5)
nRBC: 0 % (ref 0.0–0.2)

## 2022-09-13 LAB — STREP PNEUMONIAE URINARY ANTIGEN: Strep Pneumo Urinary Antigen: NEGATIVE

## 2022-09-13 LAB — MAGNESIUM: Magnesium: 1.7 mg/dL (ref 1.7–2.4)

## 2022-09-13 LAB — SARS CORONAVIRUS 2 BY RT PCR: SARS Coronavirus 2 by RT PCR: NEGATIVE

## 2022-09-13 MED ORDER — BUDESONIDE 0.5 MG/2ML IN SUSP
0.5000 mg | Freq: Two times a day (BID) | RESPIRATORY_TRACT | Status: DC
  Administered 2022-09-13 – 2022-09-14 (×3): 0.5 mg via RESPIRATORY_TRACT
  Filled 2022-09-13 (×3): qty 2

## 2022-09-13 MED ORDER — ENSURE ENLIVE PO LIQD
237.0000 mL | Freq: Two times a day (BID) | ORAL | Status: DC
  Administered 2022-09-13 – 2022-09-14 (×3): 237 mL via ORAL

## 2022-09-13 MED ORDER — SODIUM CHLORIDE 0.9 % IV SOLN: INTRAVENOUS | Status: DC

## 2022-09-13 NOTE — Plan of Care (Signed)
  Problem: Acute Rehab PT Goals(only PT should resolve) Goal: Pt Will Go Supine/Side To Sit Outcome: Progressing Flowsheets (Taken 09/13/2022 1455) Pt will go Supine/Side to Sit:  with modified independence  with supervision Goal: Patient Will Transfer Sit To/From Stand Outcome: Progressing Flowsheets (Taken 09/13/2022 1455) Patient will transfer sit to/from stand:  with modified independence  with supervision Goal: Pt Will Transfer Bed To Chair/Chair To Bed Outcome: Progressing Flowsheets (Taken 09/13/2022 1455) Pt will Transfer Bed to Chair/Chair to Bed:  with supervision  min guard assist Goal: Pt Will Ambulate Outcome: Progressing Flowsheets (Taken 09/13/2022 1455) Pt will Ambulate:  75 feet  with min guard assist  with supervision  with rolling walker  2:55 PM, 09/13/22 Ocie Bob, MPT Physical Therapist with [facility] [phone] office [phone] mobile phone

## 2022-09-13 NOTE — Assessment & Plan Note (Signed)
-   Creatinine increased from 1.2>> 1.7 - Patient is clinically volume down - 500 mL bolus given in the ED - Continue IV fluids - Trend in the a.m.

## 2022-09-13 NOTE — Evaluation (Signed)
Physical Therapy Evaluation Patient Details Name: Patricia Jackson MRN: [id number] DOB: 1941-01-26 Today's Date: 09/13/2022  History of Present Illness  Patricia Jackson is a 82 y.o. female with medical history significant of atrial fibrillation/flutter, coronary artery disease, COPD, hypertension, hyperlipidemia, and more presents the ED with a chief complaint of falling out of bed.  At the time of my exam patient really is not able to remember why she is here.  She reports that she has been having trouble breathing but reports that it has not been any worse than her normal COPD.  Then she reports it was worse 2 days ago.  Her history is very inconsistent.  She reports her dyspnea is worse with exertion.  She has had a dry cough.  She denies any fever, chest pain, palpitations.  When reminded that she fell out of bed or passed out patient reports that she does not remember any of it.  When noted that she bit her lip and has some blood around her tooth she reports that that is news to her.  Ultimately patient is a poor historian regarding this episode.   Clinical Impression  Patient demonstrates shaky labored movement for sitting up at bedside, has to lean on nearby objects for support when taking steps, transferring without AD, required use of RW for gait training demonstrating slow labored cadence without loss of balance and limited mostly due to fatigue.  Patient tolerated sitting up in chair after therapy.  Patient will benefit from continued skilled physical therapy in hospital and recommended venue below to increase strength, balance, endurance for safe ADLs and gait.         Recommendations for follow up therapy are one component of a multi-disciplinary discharge planning process, led by the attending physician.  Recommendations may be updated based on patient status, additional functional criteria and insurance authorization.  Follow Up Recommendations       Assistance Recommended at  Discharge Set up Supervision/Assistance  Patient can return home with the following  A little help with walking and/or transfers;A little help with bathing/dressing/bathroom;Help with stairs or ramp for entrance;Assistance with cooking/housework    Equipment Recommendations None recommended by PT  Recommendations for Other Services       Functional Status Assessment Patient has had a recent decline in their functional status and demonstrates the ability to make significant improvements in function in a reasonable and predictable amount of time.     Precautions / Restrictions Precautions Precautions: Fall Restrictions Weight Bearing Restrictions: No      Mobility  Bed Mobility Overal bed mobility: Needs Assistance Bed Mobility: Supine to Sit     Supine to sit: Min guard, Min assist     General bed mobility comments: increased time, labored movement    Transfers Overall transfer level: Needs assistance Equipment used: None, Rolling walker (2 wheels) Transfers: Sit to/from Stand, Bed to chair/wheelchair/BSC Sit to Stand: Min guard   Step pivot transfers: Min guard       General transfer comment: has to lean on armrest of chair for support when transferring without AD, safer using RW    Ambulation/Gait Ambulation/Gait assistance: Min guard, Min assist Gait Distance (Feet): 45 Feet Assistive device: Rolling walker (2 wheels) Gait Pattern/deviations: Decreased step length - right, Decreased step length - left, Decreased stride length Gait velocity: decreased     General Gait Details: slow labored unsteady cadence without loss of balance, limited mostly due to c/o fatigue  Stairs  Wheelchair Mobility    Modified Rankin (Stroke Patients Only)       Balance Overall balance assessment: Needs assistance Sitting-balance support: Feet supported, No upper extremity supported Sitting balance-Leahy Scale: Fair Sitting balance - Comments: fair/good  seated at EOB   Standing balance support: During functional activity, No upper extremity supported Standing balance-Leahy Scale: Poor Standing balance comment: fair/poor without AD, fair/good using RW                             Pertinent Vitals/Pain Pain Assessment Pain Assessment: No/denies pain    Home Living Family/patient expects to be discharged to:: Private residence Living Arrangements: Other relatives Available Help at Discharge: Family;Available 24 hours/day Type of Home: House Home Access: Stairs to enter Entrance Stairs-Rails: None Entrance Stairs-Number of Steps: 3   Home Layout: Two level;Able to live on main level with bedroom/bathroom Home Equipment: Rolling Walker (2 wheels);Shower seat      Prior Function Prior Level of Function : Needs assist;History of Falls (last six months)       Physical Assist : ADLs (physical);Mobility (physical) Mobility (physical): Gait;Transfers;Bed mobility;Stairs   Mobility Comments: [place]ous[place] ambulator leaning on walls and PRN supervision. ADLs Comments: Independent ADL; PRN assist IADL's.     Hand Dominance   Dominant Hand: Right    Extremity/Trunk Assessment   Upper Extremity Assessment Upper Extremity Assessment: Generalized weakness    Lower Extremity Assessment Lower Extremity Assessment: Generalized weakness    Cervical / Trunk Assessment Cervical / Trunk Assessment: Normal  Communication   Communication: HOH  Cognition Arousal/Alertness: Awake/alert Behavior During Therapy: WFL for tasks assessed/performed Overall Cognitive Status: Within Functional Limits for tasks assessed                                          General Comments      Exercises     Assessment/Plan    PT Assessment Patient needs continued PT services  PT Problem List Decreased strength;Decreased activity tolerance;Decreased balance;Decreased mobility       PT Treatment Interventions DME  instruction;Gait training;Stair training;Functional mobility training;Therapeutic activities;Therapeutic exercise;Patient/family education;Balance training    PT Goals (Current goals can be found in the Care Plan section)  Acute Rehab PT Goals Patient Stated Goal: return home with family to assist PT Goal Formulation: With patient Time For Goal Achievement: 09/16/22 Potential to Achieve Goals: Good    Frequency Min 3X/week     Co-evaluation               AM-PAC PT "6 Clicks" Mobility  Outcome Measure Help needed turning from your back to your side while in a flat bed without using bedrails?: None Help needed moving from lying on your back to sitting on the side of a flat bed without using bedrails?: A Little Help needed moving to and from a bed to a chair (including a wheelchair)?: A Little Help needed standing up from a chair using your arms (e.g., wheelchair or bedside chair)?: A Little Help needed to walk in hospital room?: A Little Help needed climbing 3-5 steps with a railing? : A Lot 6 Click Score: 18    End of Session   Activity Tolerance: Patient tolerated treatment well;Patient limited by fatigue Patient left: in chair;with call bell/phone within reach Nurse Communication: Mobility status PT Visit Diagnosis: Unsteadiness on feet (R26.81);Other abnormalities of  gait and mobility (R26.89);Muscle weakness (generalized) (M62.81)    Time: 3474-2595 PT Time Calculation (min) (ACUTE ONLY): 15 min   Charges:   PT Evaluation $PT Eval Low Complexity: 1 Low PT Treatments $Therapeutic Activity: 8-22 mins        2:54 PM, 09/13/22 Ocie Bob, MPT Physical Therapist with [facility] [phone] office [phone] mobile phone

## 2022-09-13 NOTE — TOC Initial Note (Signed)
Transition of Care [facility]) - Initial/Assessment Note    Patient Details  Name: Patricia Jackson MRN: [id number] Date of Birth: [birth year]  [facility] CM/SW Contact:    Leitha Bleak, RN Phone Number: 09/13/2022, 3:20 PM  Clinical Narrative:        Patient admitted in OBS for community acquired pneumonia. Patient lives at home with her grandson. Uses [employer] for transportation. She completed PT with [facility]. She has equipment but continues to move about the house with out it. CM encouraged patient to use her cane or walker. PT is recommending HHPT.  Patient is agreeable  to HHPT and Kandee Keen with [facility] is agreeable to accepted. MD aware to order.           Expected Discharge Plan: Home w Home Health Services Barriers to Discharge: Continued Medical Work up   Patient Goals and CMS Choice Patient states their goals for this hospitalization and ongoing recovery are:: return home. CMS Medicare.gov Compare Post Acute Care list provided to:: Patient Choice offered to / list presented to : Patient      Expected Discharge Plan and Services       Living arrangements for the past 2 months: Single Family Home                    HH Arranged: PT HH Agency: [facility] Date [facility] Agency Contacted: 09/13/22 Time HH Agency Contacted: [phone] Representative spoke with at [facility] Agency: Kandee Keen  Prior Living Arrangements/Services Living arrangements for the past 2 months: Single Family Home Lives with:: Adult Children Patient language and need for interpreter reviewed:: Yes Do you feel safe going back to the place where you live?: Yes      Need for Family Participation in Patient Care: Yes (Comment) Care giver support system in place?: Yes (comment) Current home services: DME Criminal Activity/Legal Involvement Pertinent to Current Situation/Hospitalization: No - Comment as needed  Activities of Daily Living Home Assistive Devices/Equipment: Bedside commode/3-in-1 ADL Screening  (condition at time of admission) Patient's cognitive ability adequate to safely complete daily activities?: Yes Is the patient deaf or have difficulty hearing?: No Does the patient have difficulty seeing, even when wearing glasses/contacts?: No Does the patient have difficulty concentrating, remembering, or making decisions?: No Patient able to express need for assistance with ADLs?: Yes Does the patient have difficulty dressing or bathing?: No Independently performs ADLs?: No Communication: Independent Dressing (OT): Needs assistance Is this a change from baseline?: Pre-admission baseline Grooming: Independent Feeding: Independent Bathing: Needs assistance Is this a change from baseline?: Pre-admission baseline Toileting: Needs assistance Is this a change from baseline?: Pre-admission baseline In/Out Bed: Needs assistance Is this a change from baseline?: Pre-admission baseline Walks in Home: Independent Does the patient have difficulty walking or climbing stairs?: Yes Weakness of Legs: Both Weakness of Arms/Hands: Both  Permission Sought/Granted          Permission granted to share info w Relationship: Grandson     Emotional Assessment     Affect (typically observed): Accepting Orientation: : Oriented to Self, Oriented to Place, Oriented to Situation Alcohol / Substance Use: Not Applicable Psych Involvement: No (comment)  Admission diagnosis:  CAP (community acquired pneumonia) [J18.9] Patient Active Problem List   Diagnosis Date Noted   Syncope 09/13/2022   CAP (community acquired pneumonia) 09/12/2022   sepsis from pneumonia 07/26/2022   intermittent confusion 07/26/2022   Hyponatremia 07/26/2022   Osteoporosis 01/19/2022   DOE (dyspnea on exertion)    Acidosis,  unspecified 06/20/2021   Elevated troponin 06/19/2021   Hypothyroidism 06/19/2021   Iron deficiency anemia 06/12/2021   Chronic kidney disease, 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   Right epiretinal membrane  02/17/2021   Retinal hemorrhage, right eye 11/04/2020   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   Traction detachment of left retina 08/03/2019   Left retinal detachment 08/01/2019   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HCC) 08/01/2019   Retinal hemorrhage of left eye 08/01/2019   Advanced nonexudative age-related macular degeneration of left eye with subfoveal involvement 08/01/2019   Total retinal detachment of left eye 07/26/2019   Choroidal detachment of right eye 07/26/2019   Exudative retinopathy of left eye 07/26/2019   Thrombocytopenia (HCC) 07/04/2019   Orthostatic syncope 08/27/2017   Atypical chest pain 08/27/2017   Atrial flutter (HCC) 10/28/2016   CKD (chronic kidney disease), stage IV (HCC) 10/28/2016   COPD (chronic obstructive pulmonary disease) (HCC) 10/28/2016   Depression 10/28/2016   AKI (acute kidney injury) (HCC) 09/08/2016   Esophageal dysphagia    Anemia 04/07/2016   Sinus bradycardia    CAD in native artery 03/16/2016   S/P angioplasty with stent 03/15/16 DES Resolute, pLCX 03/16/2016   NSTEMI (non-ST elevated myocardial infarction) (HCC) 03/12/2016   Dyspnea on exertion    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HCC) 10/31/2015   Hyperlipidemia 10/31/2015   Atrial flutter with rapid ventricular response (HCC) 10/30/2015   Back pain 11/26/2013   Arm pain 11/26/2013   Chest pain 11/26/2013   Hypertension    PCP:  Benita Stabile, MD Pharmacy:   [facility] - [place], [place] - [address] [place] [address] Phone: [phone] Fax: [phone]     Social Determinants of Health (SDOH) Social History: SDOH Screenings   Food Insecurity: No Food Insecurity (09/12/2022)  Housing: Low Risk  (09/12/2022)  Transportation Needs: No Transportation Needs (09/12/2022)  Utilities: Not At Risk (09/12/2022)  Tobacco Use: Medium Risk (09/12/2022)   SDOH Interventions:   Readmission Risk  Interventions    06/22/2021   12:59 PM  Readmission Risk Prevention Plan  Transportation Screening Complete  PCP or Specialist Appt within 5-7 Days Complete  Home Care Screening Complete  Medication Review (RN CM) Complete

## 2022-09-13 NOTE — Progress Notes (Signed)
Dr. Carren Rang notified of patients orthostatic vitals, no new orders.

## 2022-09-13 NOTE — Progress Notes (Signed)
Patient seen and examined; admitted after midnight secondary to shortness of breath, dehydration and syncope.  Currently hemodynamically stable; in no acute distress and expressing feeling better.  Will continue fluid resuscitation, closely follow renal function and electrolytes with repletion as needed and continue current IV antibiotics.  Pulmicort and as needed bronchodilators will be added to her regimen and will continue the use of mucolytic's.  Please refer to H&P written by Dr.Zierle-Ghosh on 09/13/2022 further info/details on admission.  Vassie Loll MD [phone]

## 2022-09-13 NOTE — Progress Notes (Addendum)
After patient resting for a few minutes BP has came down to 182/82, patient denies chest pain and SOB, Dr. Carren Rang aware, no new orders at this time.

## 2022-09-13 NOTE — Assessment & Plan Note (Signed)
-  Continue Eliquis and Lopressor 

## 2022-09-13 NOTE — Assessment & Plan Note (Signed)
-   CTA shows small airway infection/inflammation in the left lower lobe - Rocephin and Zithromax started in the ED - Sputum culture pending - Continue to monitor

## 2022-09-13 NOTE — Assessment & Plan Note (Signed)
-  Continue Synthroid -Continue to follow thyroid panel as an outpatient with further adjustment to Synthroid dosage as required.

## 2022-09-13 NOTE — Plan of Care (Signed)
  Problem: Coping: Goal: Level of anxiety will decrease Outcome: Progressing   Problem: Pain Managment: Goal: General experience of comfort will improve Outcome: Progressing   

## 2022-09-13 NOTE — Progress Notes (Signed)
Patient up to [facility] to void, of urine voided, patient still retaining urine and unable to fully empty bladder.

## 2022-09-13 NOTE — Assessment & Plan Note (Signed)
-  Stable overall -Continue current antihypertensive agents (Lopressor and Lasix). -Heart healthy/low-sodium diet discussed with patient. 

## 2022-09-13 NOTE — Assessment & Plan Note (Signed)
-   Patient reports syncope prior to arrival - Initial triage note reports falling out of bed - Patient does have evidence of a fall with blood in her mouth - Echo in the a.m. - Likely related to infection and dehydration - Monitor on telemetry - Continue fluids - Orthostatic vital signs - Troponin within normal limits x 2 - EKG shows a heart rate of 61, sinus rhythm, QTc 478 - Continue to monitor

## 2022-09-13 NOTE — Progress Notes (Signed)
*  PRELIMINARY RESULTS* Echocardiogram 2D Echocardiogram has been performed.  Stacey Drain 09/13/2022, 9:44 AM

## 2022-09-13 NOTE — H&P (Signed)
History and Physical    Patient: Patricia Jackson WUJ:[id number] DOB: November 08, 1940 DOA: 09/12/2022 DOS: the patient was seen and examined on 09/13/2022 PCP: Benita Stabile, MD  Patient coming from: Home  Chief Complaint:  Chief Complaint  Patient presents with   Shortness of Breath   HPI: Patricia Jackson is a 82 y.o. female with medical history significant of atrial fibrillation/flutter, coronary artery disease, COPD, hypertension, hyperlipidemia, and more presents the ED with a chief complaint of falling out of bed.  At the time of my exam patient really is not able to remember why she is here.  She reports that she has been having trouble breathing but reports that it has not been any worse than her normal COPD.  Then she reports it was worse 2 days ago.  Her history is very inconsistent.  She reports her dyspnea is worse with exertion.  She has had a dry cough.  She denies any fever, chest pain, palpitations.  When reminded that she fell out of bed or passed out patient reports that she does not remember any of it.  When noted that she bit her lip and has some blood around her tooth she reports that that is news to her.  Ultimately patient is a poor historian regarding this episode.  Patient does not smoke, does not drink.  She is vaccinated for COVID.  Patient is full code. Review of Systems: As mentioned in the history of present illness. All other systems reviewed and are negative. Past Medical History:  Diagnosis Date   A-fib [facility])    Atrial flutter (HCC)    On Eliquis in 8/17 but discontinued after stent placement   Breast cancer (HCC)    remote   CAD in native artery 03/16/2016   COPD (chronic obstructive pulmonary disease) (HCC)    Dysrhythmia    Hypertension    Iron deficiency anemia 06/12/2021   NSTEMI (non-ST elevated myocardial infarction) (HCC)    2017   S/P angioplasty with stent 03/15/16 DES Resolute, pLCX 03/16/2016   Vitreous hemorrhage of left eye (HCC) 08/01/2019    Past Surgical History:  Procedure Laterality Date   APPENDECTOMY     BREAST SURGERY Left    CARDIAC CATHETERIZATION N/A 03/15/2016   Procedure: Left Heart Cath and Coronary Angiography;  Surgeon: Lyn Records, MD;  Location: [facility] INVASIVE CV LAB;  Service: Cardiovascular;  Laterality: N/A;   CARDIAC CATHETERIZATION N/A 03/15/2016   Procedure: Coronary Stent Intervention;  Surgeon: Lyn Records, MD;  Location: [facility] INVASIVE CV LAB;  Service: Cardiovascular;  Laterality: N/A;   COLONOSCOPY     remote   COLONOSCOPY N/A 05/26/2016   Procedure: COLONOSCOPY;  Surgeon: Corbin Ade, MD;  Location: [facility] ENDO SUITE;  Service: Endoscopy;  Laterality: N/A;  845   ESOPHAGOGASTRODUODENOSCOPY N/A 05/26/2016   Procedure: ESOPHAGOGASTRODUODENOSCOPY (EGD);  Surgeon: Corbin Ade, MD;  Location: [facility] ENDO SUITE;  Service: Endoscopy;  Laterality: N/A;   EYE SURGERY     MALONEY DILATION N/A 05/26/2016   Procedure: Elease Hashimoto DILATION;  Surgeon: Corbin Ade, MD;  Location: [facility] ENDO SUITE;  Service: Endoscopy;  Laterality: N/A;   TOOTH EXTRACTION N/A 01/15/2022   Procedure: DENTAL RESTORATION/EXTRACTIONS;  Surgeon: Ocie Doyne, DMD;  Location: [facility] OR;  Service: Oral Surgery;  Laterality: N/A;   VITRECTOMY Left 10/03/2019   Dr. Luciana Axe, Vitrectomy, Focal Laser, Removal of Silicone Oil   Social History:  reports that she quit smoking about 7 years ago. Her smoking use included cigarettes.  She has never used smokeless tobacco. She reports that she does not drink alcohol and does not use drugs.  Allergies  Allergen Reactions   Sulfa Antibiotics     Unknown reaction     Family History  Problem Relation Age of Onset   Heart disease Mother    COPD Father    Cancer Sister        unknown primary   Cancer Brother        unknown primary   Cancer Brother    Hypertension Son    Colon cancer Neg Hx     Prior to Admission medications   Medication Sig Start Date End Date Taking? Authorizing Provider   acetaminophen (TYLENOL) 500 MG tablet Take by mouth. 03/21/17   [provider]  albuterol (PROVENTIL) (2.5 MG/3ML) 0.083% nebulizer solution Take 2.5 mg by nebulization every 6 (six) hours as needed for wheezing or shortness of breath.    [provider]  albuterol (VENTOLIN HFA) 108 (90 Base) MCG/ACT inhaler Inhale 1-2 puffs into the lungs every 6 (six) hours as needed for wheezing or shortness of breath. 03/02/22   Particia Nearing, PA-C  apixaban (ELIQUIS) 5 MG TABS tablet TAKE ONE TABLET (5MG  TOTAL) BY MOUTH TWOTIMES DAILY Patient taking differently: Take 5 mg by mouth 2 (two) times daily. 03/01/22   Quintella Reichert, MD  Cholecalciferol (VITAMIN D3) 50 MCG (2000 UT) TABS Take 2,000 Units by mouth daily.    [provider]  Copper Gluconate (COPPER CAPS PO) Take 6 mg by mouth daily.    [provider]  denosumab (PROLIA) 60 MG/ML SOSY injection Inject 60 mg into the skin every 6 (six) months. Patient not taking: Reported on 07/27/2022    [provider]  diclofenac Sodium (VOLTAREN) 1 % GEL APPLY 2 GRAMS TO THE AFFECTED AREA(S) BY TOPICAL ROUTE 4 TIMES PER DAY 06/02/22   [provider]  doxycycline (VIBRAMYCIN) 100 MG capsule Take 1 capsule (100 mg total) by mouth 2 (two) times daily for 7 days. 09/08/22 09/15/22  Arabella Merles, PA-C  fluticasone (FLONASE) 50 MCG/ACT nasal spray Place 2 sprays into both nostrils daily. 06/23/21   Rodolph Bong, MD  guaiFENesin (MUCINEX) 600 MG 12 hr tablet Take 1 tablet (600 mg total) by mouth 2 (two) times daily as needed. 07/29/22   Kendell Bane, MD  levothyroxine (SYNTHROID) 50 MCG tablet Take 50 mcg by mouth daily before breakfast.  09/20/19   [provider]  metoprolol succinate (TOPROL XL) 25 MG 24 hr tablet Take 1 tablet (25 mg total) by mouth daily. 09/02/21   Quintella Reichert, MD  nitroGLYCERIN (NITROSTAT) 0.4 MG SL tablet Place 0.4 mg under the tongue every 5 (five) minutes as  needed for chest pain. 06/09/19   [provider]  rosuvastatin (CRESTOR) 20 MG tablet Take 20 mg by mouth daily.  02/26/17   [provider]  sertraline (ZOLOFT) 25 MG tablet Take 25 mg by mouth at bedtime.    [provider]  TRELEGY ELLIPTA 100-62.5-25 MCG/ACT AEPB Inhale 1 puff into the lungs daily. 06/13/21   [provider]    Physical Exam: Vitals:   09/12/22 2255 09/12/22 2323 09/13/22 0012 09/13/22 0326  BP: (!) 149/89 (!) 168/95  (!) 162/89  Pulse: 75 66  69  Resp: 16 18  16   Temp: 98.5 F (36.9 C) 98.1 F (36.7 C)  98 F (36.7 C)  TempSrc:  Oral  Oral  SpO2: 96%  97% 98%  Weight:  61.6 kg    Height:  5\' 7"  (1.702 m)     1.  General: Patient lying supine in bed,  no acute distress   2. Psychiatric: Alert and oriented x person, place, time, but not context, mood and behavior normal for situation, pleasant and cooperative with exam   3. Neurologic: Speech and language are normal, face is symmetric, moves all 4 extremities voluntarily, at baseline without acute deficits on limited exam   4. HEENMT:  Dried blood on tooth, dry mucous membranes, normocephalic, pupils reactive to light, neck is supple, trachea is midline,    5. Respiratory : Lungs are clear to auscultation bilaterally without wheezing, rhonchi, rales, no cyanosis, no increase in work of breathing or accessory muscle use   6. Cardiovascular : Heart rate normal, rhythm is regular, no murmurs, rubs or gallops, no peripheral edema, peripheral pulses palpated   7. Gastrointestinal:  Abdomen is soft, nondistended, nontender to palpation bowel sounds active, no masses or organomegaly palpated   8. Skin:  Skin is warm, dry and intact without rashes, acute lesions, or ulcers on limited exam   9.Musculoskeletal:  No acute deformities or trauma, no asymmetry in tone, no peripheral edema, peripheral pulses palpated, no tenderness to palpation in the extremities  Data  Reviewed: In the ED Temp 98, heart rate 64-76, respiratory rate 17-28, blood pressure 145/68-172/85, satting 95-97% on room air Trope 9, 15 No leukocytosis with white blood cell count of 4.1, hemoglobin 11.6 Chemistry reveals a decreased bicarb at 15, increased BUN at 40, creatinine 1.71>> all consistent with AKI and dehydration Chest x-ray shows hyperinflation EKG shows sinus rhythm with a heart rate of 61 Patient was given NS 500 mL was started on Rocephin and Zithromax in the ED Admission requested for syncope and community-acquired pneumonia  Assessment and Plan: * CAP (community acquired pneumonia) - CTA shows small airway infection/inflammation in the left lower lobe - Rocephin and Zithromax started in the ED - Sputum culture pending - Continue to monitor  Atrial flutter (HCC) - Continue Eliquis and Lopressor  Hypertension - Continue Lopressor  CAD in native artery - Continue beta-blocker, statin  Hypothyroidism - Continue Synthroid  Hyperlipidemia - Continue statin  Syncope - Patient reports syncope prior to arrival - Initial triage note reports falling out of bed - Patient does have evidence of a fall with blood in her mouth - Echo in the a.m. - Likely related to infection and dehydration - Monitor on telemetry - Continue fluids - Orthostatic vital signs - Troponin within normal limits x 2 - EKG shows a heart rate of 61, sinus rhythm, QTc 478 - Continue to monitor  AKI (acute kidney injury) (HCC) - Creatinine increased from 1.2>> 1.7 - Patient is clinically volume down - 500 mL bolus given in the ED - Continue IV fluids - Trend in the a.m.      Advance Care Planning:   Code Status: Full Code  Consults: None at this time  Family Communication: No family at bedside  Severity of Illness: The appropriate patient status for this patient is OBSERVATION. Observation status is judged to be reasonable and necessary in order to provide the required  intensity of service to ensure the patient's safety. The patient's presenting symptoms, physical exam findings, and initial radiographic and laboratory data in the context of their medical condition is felt to place them at decreased risk for further clinical deterioration. Furthermore, it is anticipated that the patient will be medically  stable for discharge from the hospital within 2 midnights of admission.   Author: Lilyan Gilford, DO 09/13/2022 5:43 AM  For on call review [email or web address].

## 2022-09-13 NOTE — Progress Notes (Signed)
Patient admitted this shift, BP running high at this time, patient stated she takes blood pressure at home, Dr. Carren Rang notified of current BP awaiting orders at this time. Antibiotics given this shift, patient started on normal saline infusion. Patient oxygen saturation remaining at 96% and above on room air.

## 2022-09-13 NOTE — Assessment & Plan Note (Addendum)
-   Continue beta-blocker and statin; no aspirin as patient is chronically on anticoagulation. -Continue patient follow-up with cardiology service -No chest pain, no palpitation, no acute angina equivalent complaints.

## 2022-09-13 NOTE — Assessment & Plan Note (Addendum)
Continue statin 

## 2022-09-14 DIAGNOSIS — I4892 Unspecified atrial flutter: Secondary | ICD-10-CM | POA: Diagnosis not present

## 2022-09-14 DIAGNOSIS — N179 Acute kidney failure, unspecified: Secondary | ICD-10-CM | POA: Diagnosis not present

## 2022-09-14 DIAGNOSIS — J189 Pneumonia, unspecified organism: Secondary | ICD-10-CM | POA: Diagnosis not present

## 2022-09-14 DIAGNOSIS — J441 Chronic obstructive pulmonary disease with (acute) exacerbation: Secondary | ICD-10-CM

## 2022-09-14 DIAGNOSIS — E785 Hyperlipidemia, unspecified: Secondary | ICD-10-CM | POA: Diagnosis not present

## 2022-09-14 LAB — LEGIONELLA PNEUMOPHILA SEROGP 1 UR AG: L. pneumophila Serogp 1 Ur Ag: NEGATIVE

## 2022-09-14 MED ORDER — PANTOPRAZOLE SODIUM 40 MG PO TBEC: 40.0000 mg | DELAYED_RELEASE_TABLET | Freq: Every day | ORAL | 0 refills | Status: DC

## 2022-09-14 MED ORDER — SUMATRIPTAN SUCCINATE 50 MG PO TABS
100.0000 mg | ORAL_TABLET | Freq: Once | ORAL | Status: AC
  Administered 2022-09-14: 100 mg via ORAL
  Filled 2022-09-14: qty 2

## 2022-09-14 MED ORDER — AZITHROMYCIN 250 MG PO TABS: 250.0000 mg | ORAL_TABLET | Freq: Every day | ORAL | 0 refills | Status: AC

## 2022-09-14 MED ORDER — PREDNISONE 20 MG PO TABS: ORAL_TABLET | ORAL | 0 refills | Status: DC

## 2022-09-14 MED ORDER — CEFDINIR 300 MG PO CAPS: 300.0000 mg | ORAL_CAPSULE | Freq: Two times a day (BID) | ORAL | 0 refills | Status: AC

## 2022-09-14 NOTE — Progress Notes (Signed)
Mobility Specialist Progress Note:    09/14/22 1030  Mobility  Activity Ambulated with assistance in hallway  Level of Assistance Contact guard assist, steadying assist  Assistive Device Front wheel walker  Distance Ambulated (ft) 60 ft  Range of Motion/Exercises Active;All extremities  Activity Response Tolerated well  Mobility Referral Yes  $Mobility charge 1 Mobility  Mobility Specialist Start Time (ACUTE ONLY) 1030  Mobility Specialist Stop Time (ACUTE ONLY) 1045  Mobility Specialist Time Calculation (min) (ACUTE ONLY) 15 min   Pt agreeable to mobility session. Received in bed, ambulated to [facility] and in hallway with RW. CGA for safety throughout session. Tolerated well, asx throughout, no reports of SOB. During ambulation SpO2 90% on RA. Returned pt to room, recovered in chair, SpO2 96% on RA. Nursing notified, chair alarm on, all needs met.   Feliciana Rossetti Mobility Specialist Please contact via Special educational needs teacher or  Rehab office at [phone]

## 2022-09-14 NOTE — Care Management Important Message (Signed)
Important Message  Patient Details  Name: Patricia Jackson MRN: [id number] Date of Birth: [birth year]   Medicare Important Message Given:  N/A - LOS <3 / Initial given by admissions     Corey Harold 09/14/2022, 10:28 AM

## 2022-09-14 NOTE — TOC Transition Note (Signed)
Transition of Care [facility]) - CM/SW Discharge Note   Patient Details  Name: Patricia Jackson MRN: [id number] Date of Birth: Jul 26, 1940  [facility] CM/SW Contact:  Leitha Bleak, RN Phone Number: 09/14/2022, 3:36 PM   Clinical Narrative:   Patient discharging home with [facility] health. Orders placed, Cory updated.     Final next level of care: Home w Home Health Services Barriers to Discharge: Continued Medical Work up   Patient Goals and CMS Choice CMS Medicare.gov Compare Post Acute Care list provided to:: Patient Choice offered to / list presented to : Patient  Discharge Placement        Patient and family notified of of transfer: 09/14/22  Discharge Plan and Services Additional resources added to the After Visit Summary for          [facility] Arranged: PT HH Agency: [facility] Date [facility] Agency Contacted: 09/13/22 Time HH Agency Contacted: [id number] Representative spoke with at [facility] Agency: Kandee Keen  Social Determinants of Health (SDOH) Interventions SDOH Screenings   Food Insecurity: No Food Insecurity (09/12/2022)  Housing: Low Risk  (09/12/2022)  Transportation Needs: No Transportation Needs (09/12/2022)  Utilities: Not At Risk (09/12/2022)  Tobacco Use: Medium Risk (09/12/2022)     Readmission Risk Interventions    06/22/2021   12:59 PM  Readmission Risk Prevention Plan  Transportation Screening Complete  PCP or Specialist Appt within 5-7 Days Complete  Home Care Screening Complete  Medication Review (RN CM) Complete

## 2022-09-14 NOTE — Discharge Summary (Signed)
Physician Discharge Summary   Patient: Patricia Jackson MRN: [id number] DOB: 1941-02-28  Admit date:     09/12/2022  Discharge date: 09/14/22  Discharge Physician: Vassie Loll   PCP: Benita Stabile, MD   Recommendations at discharge:  Repeat chest x-ray in 6-8 weeks to assure resolution of infiltrates Reassess blood pressure and adjust antihypertensive regimen as needed Repeat basic metabolic panel to follow electrolytes and renal function Repeat CBC to follow hemoglobin trend/stability.  Discharge Diagnoses: Principal Problem:   CAP (community acquired pneumonia) Active Problems:   Atrial flutter (HCC)   Hypertension   CAD in native artery   Hypothyroidism   Hyperlipidemia   AKI (acute kidney injury) (HCC)   Syncope COPD with mild exacerbation.  Brief Hospital admission narrative course: Patricia Jackson is a 82 y.o. female with medical history significant of atrial fibrillation/flutter, coronary artery disease, COPD, hypertension, hyperlipidemia, and more presents the ED with a chief complaint of falling out of bed.  At the time of my exam patient really is not able to remember why she is here.  She reports that she has been having trouble breathing but reports that it has not been any worse than her normal COPD.  Then she reports it was worse 2 days ago.  Her history is very inconsistent.  She reports her dyspnea is worse with exertion.  She has had a dry cough.  She denies any fever, chest pain, palpitations.  When reminded that she fell out of bed or passed out patient reports that she does not remember any of it.  When noted that she bit her lip and has some blood around her tooth she reports that that is news to her.  Ultimately patient is a poor historian regarding this episode.   Patient does not smoke, does not drink.  She is vaccinated for COVID.  Patient is full code.  Assessment and Plan: * CAP (community acquired pneumonia) - CTA shows small airway infection/inflammation  in the left lower lobe - Complete oral antibiotics as instructed -Due to underlying history of COPD patient will also receive steroids tapering and has been instructed to be compliant with bronchodilator/nebulizer management.  -Mucolytic's, adequate hydration and flutter valve has been instructed.   Atrial flutter (HCC) - Continue Eliquis and Lopressor -Rate controlled and is stable.  Hypertension -Overall stable -Patient advised to follow heart healthy/low-sodium diet -Continue home antihypertensive agents -Outpatient follow-up with PCP to reassess vital signs and further adjust medications as needed.  CAD in native artery - Continue beta-blocker and statin; no aspirin as patient is chronically on anticoagulation. -Continue patient follow-up with cardiology service -No chest pain, no palpitation, no acute angina equivalent complaints.  Hypothyroidism -Continue Synthroid -Continue to follow thyroid panel as an outpatient with further adjustment to Synthroid dosage as required.  Hyperlipidemia - Continue statin -Heart healthy diet discussed with patient.  Syncope - Patient reports syncope prior to arrival - Initial triage note reports falling out of bed - Echo demonstrating preserved ejection fraction and no wall motion abnormalities. -Telemetry without acute ischemic normal troponin x 2. -Patient reports no chest pain. -No further episodes of syncope appreciated; physical therapy worked with patient and home health services recommended.  AKI (acute kidney injury) (HCC) - Creatinine increased from 1.2>> 1.7 - In the setting of prerenal azotemia and decreased oral intake most likely -After fluid resuscitation and avoiding nephrotoxic agents renal function back to her baseline. -Repeat basic metabolic panel to follow electrolytes trend and renal function and stability.  COPD: -  Mild acute exacerbation secondary to community-acquired pneumonia infection. -Continue steroid  tapering and bronchodilator/nebulizer management -Outpatient follow-up with PCP/pulmonologist. -No oxygen requirement needed at time of discharge.   Consultants: None Procedures performed: See below for x-ray reports. Disposition: Home with home health services Diet recommendation: Heart healthy diet.  DISCHARGE MEDICATION: Allergies as of 09/14/2022       Reactions   Sulfa Antibiotics    Unknown reaction         Medication List     STOP taking these medications    doxycycline 100 MG capsule Commonly known as: VIBRAMYCIN       TAKE these medications    acetaminophen 500 MG tablet Commonly known as: TYLENOL Take by mouth.   albuterol (2.5 MG/3ML) 0.083% nebulizer solution Commonly known as: PROVENTIL Take 2.5 mg by nebulization every 6 (six) hours as needed for wheezing or shortness of breath.   albuterol 108 (90 Base) MCG/ACT inhaler Commonly known as: VENTOLIN HFA Inhale 1-2 puffs into the lungs every 6 (six) hours as needed for wheezing or shortness of breath.   azithromycin 250 MG tablet Commonly known as: Zithromax Z-Pak Take 1 tablet (250 mg total) by mouth daily for 5 days. Take 2 tablets (500 mg) on  Day 1,  followed by 1 tablet (250 mg) once daily on Days 2 through 5.   cefdinir 300 MG capsule Commonly known as: OMNICEF Take 1 capsule (300 mg total) by mouth 2 (two) times daily for 5 days.   COPPER CAPS PO Take 6 mg by mouth daily.   denosumab 60 MG/ML Sosy injection Commonly known as: PROLIA Inject 60 mg into the skin every 6 (six) months.   diclofenac Sodium 1 % Gel Commonly known as: VOLTAREN APPLY 2 GRAMS TO THE AFFECTED AREA(S) BY TOPICAL ROUTE 4 TIMES PER DAY   Eliquis 5 MG Tabs tablet Generic drug: apixaban TAKE ONE TABLET (5MG  TOTAL) BY MOUTH TWOTIMES DAILY What changed: See the new instructions.   fluticasone 50 MCG/ACT nasal spray Commonly known as: FLONASE Place 2 sprays into both nostrils daily.   guaiFENesin 600 MG 12 hr  tablet Commonly known as: Mucinex Take 1 tablet (600 mg total) by mouth 2 (two) times daily as needed.   levothyroxine 50 MCG tablet Commonly known as: SYNTHROID Take 50 mcg by mouth daily before breakfast.   metoprolol succinate 25 MG 24 hr tablet Commonly known as: Toprol XL Take 1 tablet (25 mg total) by mouth daily.   nitroGLYCERIN 0.4 MG SL tablet Commonly known as: NITROSTAT Place 0.4 mg under the tongue every 5 (five) minutes as needed for chest pain.   pantoprazole 40 MG tablet Commonly known as: Protonix Take 1 tablet (40 mg total) by mouth daily.   predniSONE 20 MG tablet Commonly known as: DELTASONE Take 3 tabs by Mouth daily x 1 day; then 2 tablets by mouth daily x 2 days; then 1 tablet by mouth daily x 3 days; then half tablet by mouth daily x 3 days and stop prednisone.   rosuvastatin 20 MG tablet Commonly known as: CRESTOR Take 20 mg by mouth daily.   sertraline 25 MG tablet Commonly known as: ZOLOFT Take 25 mg by mouth at bedtime.   Trelegy Ellipta 100-62.5-25 MCG/ACT Aepb Generic drug: Fluticasone-Umeclidin-Vilant Inhale 1 puff into the lungs daily.   Vitamin D3 50 MCG (2000 UT) Tabs Take 2,000 Units by mouth daily.        Follow-up Information     [facility] Follow  up.   Specialty: Home Health Services Why: PT will call to schedule your next appointment Contact information: [address] [place] [address] [phone]         Benita Stabile, MD. Schedule an appointment as soon as possible for a visit in 2 week(s).   Specialty: Internal Medicine Contact information: [address] [place] [address] [phone]                Discharge Exam: Filed Weights   09/12/22 1727 09/12/22 2323  Weight: 73 kg 61.6 kg   General exam: Alert, awake, oriented x 3; speaking in full sentences and no requiring oxygen supplementation.  Safe to discharge home with instruction to follow-up with PCP in 10  days. Respiratory system: Improved air movement bilaterally; mild expiratory wheezing or rhonchi.  No using accessory muscles. Cardiovascular system: Rate controlled, no rubs, no gallops, no JVD. Gastrointestinal system: Abdomen is nondistended, soft and nontender. No organomegaly or masses felt. Normal bowel sounds heard. Central nervous system: Alert and oriented. No focal neurological deficits. Extremities: No cyanosis or clubbing. Skin: No petechiae. Psychiatry: Judgement and insight appear normal. Mood & affect appropriate.   Condition at discharge: Stable and improved.  The results of significant diagnostics from this hospitalization (including imaging, microbiology, ancillary and laboratory) are listed below for reference.   Imaging Studies: ECHOCARDIOGRAM COMPLETE  Result Date: 09/13/2022    ECHOCARDIOGRAM REPORT   Patient Name:   JADON RESSLER Date of Exam: 09/13/2022 Medical Rec #:  [id number]       Height:       67.0 in Accession #:    [id number]      Weight:       135.8 lb Date of Birth:  Feb 08, 1941      BSA:          1.715 m Patient Age:    81 years        BP:           162/89 mmHg Patient Gender: F               HR:           69 bpm. [facility] Procedure: 2D Echo, Cardiac Doppler and Color Doppler Indications:    Syncope R55  History:        Patient has prior history of Echocardiogram examinations, most                 recent 07/27/2022. CAD and Previous Myocardial Infarction, COPD,                 Arrythmias:Atrial Flutter; Risk Factors:Hypertension and                 Dyslipidemia. Hx of breast cancer with left mastectomy.  Sonographer:    Celesta Gentile RCS Referring Phys: [id number] ASIA B ZIERLE-GHOSH IMPRESSIONS  1. Left ventricular ejection fraction, by estimation, is 60 to 65%. The left ventricle has normal function. The left ventricle has no regional wall motion abnormalities. There is mild left ventricular hypertrophy. Left ventricular diastolic parameters are  indeterminate. Elevated left atrial pressure.  2. Right ventricular systolic function is normal. The right ventricular size is normal.  3. The mitral valve is abnormal. Mild mitral valve regurgitation. No evidence of mitral stenosis.  4. The aortic valve is tricuspid. There is mild calcification of the aortic valve. There is mild thickening of the aortic valve. Aortic valve regurgitation is not visualized.  No aortic stenosis is present.  5. The inferior vena cava is normal in size with greater than 50% respiratory variability, suggesting right atrial pressure of 3 mmHg. FINDINGS  Left Ventricle: Left ventricular ejection fraction, by estimation, is 60 to 65%. The left ventricle has normal function. The left ventricle has no regional wall motion abnormalities. The left ventricular internal cavity size was normal in size. There is  mild left ventricular hypertrophy. Left ventricular diastolic parameters are indeterminate. Elevated left atrial pressure. Right Ventricle: The right ventricular size is normal. Right vetricular wall thickness was not well visualized. Right ventricular systolic function is normal. Left Atrium: Left atrial size was normal in size. Right Atrium: Right atrial size was normal in size. Pericardium: There is no evidence of pericardial effusion. Mitral Valve: The mitral valve is abnormal. There is mild thickening of the mitral valve leaflet(s). There is mild calcification of the mitral valve leaflet(s). Mild mitral annular calcification. Mild mitral valve regurgitation. No evidence of mitral valve stenosis. Tricuspid Valve: The tricuspid valve is normal in structure. Tricuspid valve regurgitation is trivial. No evidence of tricuspid stenosis. Aortic Valve: The aortic valve is tricuspid. There is mild calcification of the aortic valve. There is mild thickening of the aortic valve. There is mild aortic valve annular calcification. Aortic valve regurgitation is not visualized. No aortic stenosis  is  present. Aortic valve mean gradient measures 3.6 mmHg. Aortic valve peak gradient measures 7.2 mmHg. Aortic valve area, by VTI measures 1.89 cm. Pulmonic Valve: The pulmonic valve was not well visualized. Pulmonic valve regurgitation is not visualized. No evidence of pulmonic stenosis. Aorta: The aortic root is normal in size and structure. Venous: The inferior vena cava is normal in size with greater than 50% respiratory variability, suggesting right atrial pressure of 3 mmHg. IAS/Shunts: No atrial level shunt detected by color flow Doppler.  LEFT VENTRICLE PLAX 2D LVIDd:         4.40 cm   Diastology LVIDs:         2.70 cm   LV e' medial:    4.90 cm/s LV PW:         1.10 cm   LV E/e' medial:  28.0 LV IVS:        1.10 cm   LV e' lateral:   7.72 cm/s LVOT diam:     1.80 cm   LV E/e' lateral: 17.7 LV SV:         58 LV SV Index:   34 LVOT Area:     2.54 cm  RIGHT VENTRICLE RV S prime:     12.60 cm/s TAPSE (M-mode): 2.5 cm LEFT ATRIUM             Index        RIGHT ATRIUM           Index LA diam:        3.10 cm 1.81 cm/m   RA Area:     12.00 cm LA Vol (A2C):   66.0 ml 38.47 ml/m  RA Volume:   26.80 ml  15.62 ml/m LA Vol (A4C):   41.7 ml 24.31 ml/m LA Biplane Vol: 56.4 ml 32.88 ml/m  AORTIC VALVE AV Area (Vmax):    2.18 cm AV Area (Vmean):   1.88 cm AV Area (VTI):     1.89 cm AV Vmax:           134.16 cm/s AV Vmean:          88.589 cm/s AV VTI:  0.309 m AV Peak Grad:      7.2 mmHg AV Mean Grad:      3.6 mmHg LVOT Vmax:         115.00 cm/s LVOT Vmean:        65.300 cm/s LVOT VTI:          0.229 m LVOT/AV VTI ratio: 0.74  AORTA Ao Root diam: 3.10 cm MITRAL VALVE MV Area (PHT): 2.05 cm     SHUNTS MV Decel Time: 370 msec     Systemic VTI:  0.23 m MV E velocity: 137.00 cm/s  Systemic Diam: 1.80 cm MV A velocity: 126.00 cm/s MV E/A ratio:  1.09 Dina Rich MD Electronically signed by Dina Rich MD Signature Date/Time: 09/13/2022/12:06:07 PM    Final    CT Angio Chest PE W and/or Wo  Contrast  Result Date: 09/12/2022 CLINICAL DATA:  Fall out of bed 1 hour ago. Shortness of breath. PE suspected EXAM: CT ANGIOGRAPHY CHEST WITH CONTRAST TECHNIQUE: Multidetector CT imaging of the chest was performed using the standard protocol during bolus administration of intravenous contrast. Multiplanar CT image reconstructions and MIPs were obtained to evaluate the vascular anatomy. RADIATION DOSE REDUCTION: This exam was performed according to the departmental dose-optimization program which includes automated exposure control, adjustment of the mA and/or kV according to patient size and/or use of iterative reconstruction technique. CONTRAST:  60mL OMNIPAQUE IOHEXOL 350 MG/ML SOLN COMPARISON:  Chest radiograph 09/12/2022 and CT chest 02/09/2021 FINDINGS: Cardiovascular: Cardiomegaly. No pericardial effusion. Coronary artery and aortic atherosclerotic calcification. Satisfactory opacification of the pulmonary arteries to the segmental level. No pulmonary embolism. Mediastinum/Nodes: Small hiatal hernia. Trachea and esophagus are otherwise unremarkable. No thoracic adenopathy. Lungs/Pleura: Advanced emphysema. Diffuse interlobular septal thickening. Bronchial wall thickening, mucous plugging, and bronchiolectasis in the left lower lobe with centrilobular ground-glass nodules and tree-in-bud opacities. No pleural effusion or pneumothorax. Upper Abdomen: No acute abnormality. Musculoskeletal: No acute fracture. Review of the MIP images confirms the above findings. IMPRESSION: 1. No evidence of acute pulmonary embolism. 2. Small airway infection/inflammation in the left lower lobe. 3. Cardiomegaly with interstitial pulmonary edema superimposed on a background of emphysema. Aortic Atherosclerosis (ICD10-I70.0) and Emphysema (ICD10-J43.9). Electronically Signed   By: Minerva Fester M.D.   On: 09/12/2022 21:33   CT Head Wo Contrast  Result Date: 09/12/2022 CLINICAL DATA:  Head trauma, intracranial arterial injury  suspected. EXAM: CT HEAD WITHOUT CONTRAST TECHNIQUE: Contiguous axial images were obtained from the base of the skull through the vertex without intravenous contrast. RADIATION DOSE REDUCTION: This exam was performed according to the departmental dose-optimization program which includes automated exposure control, adjustment of the mA and/or kV according to patient size and/or use of iterative reconstruction technique. COMPARISON:  Head CT 07/26/2022. FINDINGS: Brain: No acute hemorrhage. Unchanged mild chronic small-vessel disease. Cortical gray-white differentiation is otherwise preserved. Prominence of the ventricles and sulci within expected range for age. No hydrocephalus or extra-axial collection. No mass effect or midline shift. Vascular: No hyperdense vessel or unexpected calcification. Skull: No calvarial fracture or suspicious bone lesion. Skull base is unremarkable. Sinuses/Orbits: Unremarkable. Other: None. IMPRESSION: 1. No acute intracranial abnormality. 2. Unchanged mild chronic small-vessel disease. Electronically Signed   By: Orvan Falconer M.D.   On: 09/12/2022 19:01   DG Chest Port 1 View  Result Date: 09/12/2022 CLINICAL DATA:  Shortness of breath EXAM: PORTABLE CHEST 1 VIEW COMPARISON:  X-ray 09/08/2022 and older FINDINGS: Hyperinflation. Mild basilar atelectasis is increased. Chronic interstitial lung changes. No consolidation, pneumothorax or  effusion. Stable cardiopericardial silhouette with calcified aorta. Overlapping cardiac leads. Osteopenia. IMPRESSION: Hyperinflation. Increasing but mild basilar atelectasis. Chronic interstitial lung changes Electronically Signed   By: Karen Kays M.D.   On: 09/12/2022 17:58   DG Chest 2 View  Result Date: 09/08/2022 CLINICAL DATA:  Cough, shortness of breath. EXAM: CHEST - 2 VIEW COMPARISON:  July 26, 2022. FINDINGS: The heart size and mediastinal contours are within normal limits. Minimal bibasilar subsegmental atelectasis is noted. The  visualized skeletal structures are unremarkable. IMPRESSION: Minimal bibasilar subsegmental atelectasis. Electronically Signed   By: Lupita Raider M.D.   On: 09/08/2022 09:30    Microbiology: Results for orders placed or performed during the hospital encounter of 09/12/22  SARS Coronavirus 2 by RT PCR (hospital order, performed in [facility] hospital lab) *cepheid single result test* Anterior Nasal Swab     Status: None   Collection Time: 09/13/22 12:32 AM   Specimen: Anterior Nasal Swab  Result Value Ref Range Status   SARS Coronavirus 2 by RT PCR NEGATIVE NEGATIVE Final    Comment: (NOTE) SARS-CoV-2 target nucleic acids are NOT DETECTED.  The SARS-CoV-2 RNA is generally detectable in upper and lower respiratory specimens during the acute phase of infection. The lowest concentration of SARS-CoV-2 viral copies this assay can detect is 250 copies / mL. A negative result does not preclude SARS-CoV-2 infection and should not be used as the sole basis for treatment or other patient management decisions.  A negative result may occur with improper specimen collection / handling, submission of specimen other than nasopharyngeal swab, presence of viral mutation(s) within the areas targeted by this assay, and inadequate number of viral copies (<250 copies / mL). A negative result must be combined with clinical observations, patient history, and epidemiological information.  Fact Sheet for Patients:   RoadLapTop.co.za  Fact Sheet for Healthcare Providers: http://kim-miller.com/  This test is not yet approved or  cleared by the [place] FDA and has been authorized for detection and/or diagnosis of SARS-CoV-2 by FDA under an Emergency Use Authorization (EUA).  This EUA will remain in effect (meaning this test can be used) for the duration of the COVID-19 declaration under Section 564(b)(1) of the Act, 21 U.S.C. section 360bbb-3(b)(1), unless  the authorization is terminated or revoked sooner.  Performed at [facility], [address], [place], [place] [address]     Labs: CBC: Recent Labs  Lab 09/08/22 0836 09/12/22 1727 09/13/22 0514  WBC 7.2 4.1 3.6*  NEUTROABS  --  2.1 2.1  HGB 11.8* 11.6* 10.9*  HCT 37.2 35.7* 33.8*  MCV 95.4 94.9 94.9  PLT PLATELET CLUMPS NOTED ON SMEAR, COUNT APPEARS DECREASED 93* 74*   Basic Metabolic Panel: Recent Labs  Lab 09/08/22 0804 09/12/22 1727 09/13/22 0514  NA 133* 135 138  K 4.1 4.0 4.8  CL 105 110 111  CO2 18* 15* 19*  GLUCOSE 122* 90 86  BUN 29* 40* 35*  CREATININE 1.61* 1.71* 1.54*  CALCIUM 9.3 9.2 9.1  MG  --   --  1.7   Liver Function Tests: Recent Labs  Lab 09/12/22 1727 09/13/22 0514  AST 33 29  ALT 15 15  ALKPHOS 78 73  BILITOT 0.5 0.5  PROT 6.3* 6.2*  ALBUMIN 3.3* 3.2*   CBG: No results for input(s): "GLUCAP" in the last 168 hours.  Discharge time spent: greater than 30 minutes.  Signed: Vassie Loll, MD [facility] 09/14/2022

## 2022-09-14 NOTE — Progress Notes (Signed)
Physical Therapy Treatment Patient Details Name: Patricia Jackson MRN: [id number] DOB: 25-Nov-1940 Today's Date: 09/14/2022   History of Present Illness Patricia Jackson is a 82 y.o. female with medical history significant of atrial fibrillation/flutter, coronary artery disease, COPD, hypertension, hyperlipidemia, and more presents the ED with a chief complaint of falling out of bed.  At the time of my exam patient really is not able to remember why she is here.  She reports that she has been having trouble breathing but reports that it has not been any worse than her normal COPD.  Then she reports it was worse 2 days ago.  Her history is very inconsistent.  She reports her dyspnea is worse with exertion.  She has had a dry cough.  She denies any fever, chest pain, palpitations.  When reminded that she fell out of bed or passed out patient reports that she does not remember any of it.  When noted that she bit her lip and has some blood around her tooth she reports that that is news to her.  Ultimately patient is a poor historian regarding this episode.    PT Comments    Patient presents seated in chair (assisted by nursing staff) and agreeable for therapy.  Patient demonstrates fair/good return for completing BLE ROM/strengthening exercises requiring rest breaks in between sets due to mild SOB, slightly increased endurance for ambulation in hallway, but limited mostly due to c/o fatigue.  Patient tolerated staying up in chair after therapy.  Patient will benefit from continued skilled physical therapy in hospital and recommended venue below to increase strength, balance, endurance for safe ADLs and gait.      Recommendations for follow up therapy are one component of a multi-disciplinary discharge planning process, led by the attending physician.  Recommendations may be updated based on patient status, additional functional criteria and insurance authorization.  Follow Up Recommendations        Assistance Recommended at Discharge Set up Supervision/Assistance  Patient can return home with the following A little help with walking and/or transfers;A little help with bathing/dressing/bathroom;Help with stairs or ramp for entrance;Assistance with cooking/housework   Equipment Recommendations  None recommended by PT    Recommendations for Other Services       Precautions / Restrictions Precautions Precautions: Fall Restrictions Weight Bearing Restrictions: No     Mobility  Bed Mobility               General bed mobility comments: Patient presents seated in chair (assisted by nursing staff)    Transfers Overall transfer level: Needs assistance Equipment used: Rolling walker (2 wheels) Transfers: Sit to/from Stand, Bed to chair/wheelchair/BSC Sit to Stand: Supervision, Min guard   Step pivot transfers: Supervision, Min guard       General transfer comment: slightly labored movement    Ambulation/Gait Ambulation/Gait assistance: Supervision, Min guard Gait Distance (Feet): 65 Feet Assistive device: Rolling walker (2 wheels) Gait Pattern/deviations: Decreased step length - left, Decreased stance time - right, Decreased stride length Gait velocity: decreased     General Gait Details: able to take a few steps without AD with unsteady labored movement, safer using RW with good return for use and limited mostly due to c/o fatigue   Stairs             Wheelchair Mobility    Modified Rankin (Stroke Patients Only)       Balance Overall balance assessment: Needs assistance Sitting-balance support: Feet supported, No upper extremity supported Sitting balance-Leahy Scale:  Good Sitting balance - Comments: seated in chair   Standing balance support: During functional activity, No upper extremity supported Standing balance-Leahy Scale: Poor Standing balance comment: fair/poor without AD, fair/good using RW                             Cognition Arousal/Alertness: Awake/alert Behavior During Therapy: WFL for tasks assessed/performed Overall Cognitive Status: Within Functional Limits for tasks assessed                                          Exercises General Exercises - Lower Extremity Long Arc Quad: Seated, AROM, Strengthening, Both, 10 reps Hip Flexion/Marching: Seated, AROM, Strengthening, Both, 10 reps Toe Raises: Seated, AROM, Strengthening, Both, 10 reps Heel Raises: Seated, AROM, Strengthening, Both, 10 reps    General Comments        Pertinent Vitals/Pain Pain Assessment Pain Assessment: No/denies pain    Home Living                          Prior Function            PT Goals (current goals can now be found in the care plan section) Acute Rehab PT Goals Patient Stated Goal: return home with family to assist PT Goal Formulation: With patient Time For Goal Achievement: 09/16/22 Potential to Achieve Goals: Good Progress towards PT goals: Progressing toward goals    Frequency    Min 3X/week      PT Plan Current plan remains appropriate    Co-evaluation              AM-PAC PT "6 Clicks" Mobility   Outcome Measure  Help needed turning from your back to your side while in a flat bed without using bedrails?: None Help needed moving from lying on your back to sitting on the side of a flat bed without using bedrails?: A Little Help needed moving to and from a bed to a chair (including a wheelchair)?: A Little Help needed standing up from a chair using your arms (e.g., wheelchair or bedside chair)?: A Little Help needed to walk in hospital room?: A Little Help needed climbing 3-5 steps with a railing? : A Little 6 Click Score: 19    End of Session   Activity Tolerance: Patient tolerated treatment well;Patient limited by fatigue Patient left: in chair;with call bell/phone within reach Nurse Communication: Mobility status PT Visit Diagnosis: Unsteadiness  on feet (R26.81);Other abnormalities of gait and mobility (R26.89);Muscle weakness (generalized) (M62.81)     Time: 0981-1914 PT Time Calculation (min) (ACUTE ONLY): 20 min  Charges:  $Gait Training: 8-22 mins $Therapeutic Exercise: 8-22 mins                     3:46 PM, 09/14/22 Ocie Bob, MPT Physical Therapist with [facility] [phone] office [phone] mobile phone

## 2022-09-14 NOTE — Progress Notes (Signed)
Patient slept through the night only wakening for medications and vitals. No complaints of pain. Continued to monitor patient.

## 2022-09-15 DIAGNOSIS — Z0000 Encounter for general adult medical examination without abnormal findings: Secondary | ICD-10-CM | POA: Diagnosis not present

## 2022-09-17 NOTE — Progress Notes (Signed)
Office Visit Note  Patient: Patricia Jackson             Date of Birth: 08-26-1940           MRN: [id number]             PCP: Patricia Stabile, MD Referring: Patricia Stabile, MD Visit Date: 10/01/2022 Occupation: @GUAROCC @  Subjective:  Fatigue  History of Present Illness: Patricia Jackson is a 82 y.o. female with history of positive ANA, Raynauds and osteo.  She was in June 2023.  Patient states few months ago she fell and fractured her left wrist and had a cast for it.  It eventually healed but she still have some discomfort.  She states she had another fall in the first week of June when she fell out of the bed.  She had some soreness in her rib cage.  This happened the same time when she was hospitalized for community-acquired pneumonia.  She was discharged on September 14, 2022 after being in the hospital for community-acquired pneumonia.  He was given a steroid taper.  She denies any recent syncopal episodes.  She still gets raynaud's phenominon during the winter months.  She gives history of dry mouth and dry eyes.  There is no history of malar rash, oral ulcers, inflammatory arthritis or lymphadenopathy.    Activities of Daily Living:  Patient reports morning stiffness for a few minutes.   Patient Denies nocturnal pain.  Difficulty dressing/grooming: Denies Difficulty climbing stairs: Reports Difficulty getting out of chair: Reports Difficulty using hands for taps, buttons, cutlery, and/or writing: Denies  Review of Systems  Constitutional:  Positive for fatigue.  HENT:  Positive for mouth dryness. Negative for mouth sores.   Eyes:  Positive for dryness.  Respiratory:  Positive for shortness of breath.   Cardiovascular:  Negative for chest pain and palpitations.  Gastrointestinal:  Negative for blood in stool, constipation and diarrhea.  Endocrine: Negative for increased urination.  Genitourinary:  Negative for involuntary urination.  Musculoskeletal:  Positive for gait problem,  myalgias, muscle weakness, morning stiffness, muscle tenderness and myalgias. Negative for joint pain, joint pain and joint swelling.  Skin:  Negative for color change, rash, hair loss and sensitivity to sunlight.  Allergic/Immunologic: Negative for susceptible to infections.  Neurological:  Positive for dizziness and numbness. Negative for headaches.  Hematological:  Negative for swollen glands.  Psychiatric/Behavioral:  Negative for depressed mood and sleep disturbance. The patient is not nervous/anxious.     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   Traction detachment of left retina 08/03/2019   Left retinal detachment 08/01/2019   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HCC) 08/01/2019   Retinal hemorrhage of left eye 08/01/2019   Advanced nonexudative age-related macular degeneration of left eye with subfoveal involvement 08/01/2019   Total retinal detachment of left eye 07/26/2019   Choroidal detachment of right eye 07/26/2019   Exudative retinopathy of  left eye 07/26/2019   Thrombocytopenia (HCC) 07/04/2019   Orthostatic syncope 08/27/2017   Atypical chest pain 08/27/2017   Atrial flutter (HCC) 10/28/2016   CKD (chronic kidney disease), stage IV (HCC) 10/28/2016   COPD (chronic obstructive pulmonary disease) (HCC) 10/28/2016   Depression 10/28/2016   AKI (acute  kidney injury) (HCC) 09/08/2016   Esophageal dysphagia    Anemia 04/07/2016   Sinus bradycardia    CAD in native artery 03/16/2016   S/P angioplasty with stent 03/15/16 DES Resolute, pLCX 03/16/2016   NSTEMI (non-ST elevated myocardial infarction) (HCC) 03/12/2016   Dyspnea on exertion    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HCC) 10/31/2015   Hyperlipidemia 10/31/2015   Atrial flutter with rapid ventricular response (HCC) 10/30/2015   Back pain 11/26/2013   Arm pain 11/26/2013   Chest pain 11/26/2013   Hypertension     Past Medical History:  Diagnosis Date   A-fib [facility])    Atrial flutter (HCC)    On Eliquis in 8/17 but discontinued after stent placement   Breast cancer (HCC)    remote   CAD in native artery 03/16/2016   COPD (chronic obstructive pulmonary disease) (HCC)    Dysrhythmia    Hypertension    Iron deficiency anemia 06/12/2021   NSTEMI (non-ST elevated myocardial infarction) (HCC)    2017   S/P angioplasty with stent 03/15/16 DES Resolute, pLCX 03/16/2016   Vitreous hemorrhage of left eye (HCC) 08/01/2019    Family History  Problem Relation Age of Onset   Heart disease Mother    COPD Father    Cancer Sister        unknown primary   Cancer Brother        unknown primary   Cancer Brother    Hypertension Son    Colon cancer Neg Hx    Past Surgical History:  Procedure Laterality Date   APPENDECTOMY     BREAST SURGERY Left    CARDIAC CATHETERIZATION N/A 03/15/2016   Procedure: Left Heart Cath and Coronary Angiography;  Surgeon: Lyn Records, MD;  Location: [facility] INVASIVE CV LAB;  Service: Cardiovascular;  Laterality: N/A;   CARDIAC CATHETERIZATION N/A 03/15/2016   Procedure: Coronary Stent Intervention;  Surgeon: Lyn Records, MD;  Location: [facility] INVASIVE CV LAB;  Service: Cardiovascular;  Laterality: N/A;   COLONOSCOPY     remote   COLONOSCOPY N/A 05/26/2016   Procedure: COLONOSCOPY;  Surgeon: Corbin Ade, MD;  Location: [facility] ENDO SUITE;  Service: Endoscopy;  Laterality:  N/A;  845   ESOPHAGOGASTRODUODENOSCOPY N/A 05/26/2016   Procedure: ESOPHAGOGASTRODUODENOSCOPY (EGD);  Surgeon: Corbin Ade, MD;  Location: [facility] ENDO SUITE;  Service: Endoscopy;  Laterality: N/A;   EYE SURGERY     EYE SURGERY Left    Dr. Kai Levins DILATION N/A 05/26/2016   Procedure: Elease Hashimoto DILATION;  Surgeon: Corbin Ade, MD;  Location: [facility] ENDO SUITE;  Service: Endoscopy;  Laterality: N/A;   TOOTH EXTRACTION N/A 01/15/2022   Procedure: DENTAL RESTORATION/EXTRACTIONS;  Surgeon: Ocie Doyne, DMD;  Location: [facility] OR;  Service: Oral Surgery;  Laterality: N/A;   VITRECTOMY Left 10/03/2019   Dr. Luciana Axe, Vitrectomy, Focal Laser, Removal of Silicone Oil   Social History   Social History Narrative   ** Merged History Encounter **       Immunization History  Administered Date(s) Administered   Influenza,inj,Quad PF,6+ Mos 01/27/2016   Moderna Sars-Covid-2 Vaccination 06/15/2019, 07/16/2019   Tdap 09/04/2019     Objective: Vital Signs: BP (!) 155/77 (BP  Location: Right Arm, Patient Position: Sitting, Cuff Size: Normal)   Pulse (!) 55   Resp 14   Ht 5\' 7"  (1.702 m)   Wt 141 lb 3.2 oz (64 kg)   BMI 22.12 kg/m    Physical Exam Vitals and nursing note reviewed.  Constitutional:      Appearance: She is well-developed.  HENT:     Head: Normocephalic and atraumatic.  Eyes:     Conjunctiva/sclera: Conjunctivae normal.  Cardiovascular:     Rate and Rhythm: Normal rate and regular rhythm.     Heart sounds: Normal heart sounds.  Pulmonary:     Effort: Pulmonary effort is normal.     Breath sounds: Normal breath sounds.  Abdominal:     General: Bowel sounds are normal.     Palpations: Abdomen is soft.  Musculoskeletal:     Cervical back: Normal range of motion.  Lymphadenopathy:     Cervical: No cervical adenopathy.  Skin:    General: Skin is warm and dry.     Capillary Refill: Capillary refill takes less than 2 seconds.     Comments: No nailbed capillary changes or  sclerodactyly was noted.  Neurological:     Mental Status: She is alert and oriented to person, place, and time.  Psychiatric:        Behavior: Behavior normal.      Musculoskeletal Exam: Cervical spine was in good range of motion.  She had partial thoracic kyphosis.  She had no tenderness over thoracic or lumbar spine.  She had tenderness over left rib cage.  Shoulder joints and elbow joints were in good range of motion.  She has subluxation of her left wrist with mild swelling.  There was no synovitis over MCPs PIPs or DIPs.  Hip joints had limited abduction without discomfort.  Knee joints in good range of motion without any warmth swelling or effusion.  There was no tenderness over ankles or MTPs.  CDAI Exam: CDAI Score: -- Patient Global: --; Provider Global: -- Swollen: --; Tender: -- Joint Exam 10/01/2022   No joint exam has been documented for this visit   There is currently no information documented on the homunculus. Go to the Rheumatology activity and complete the homunculus joint exam.  Investigation: No additional findings.  Imaging: ECHOCARDIOGRAM COMPLETE  Result Date: 09/13/2022    ECHOCARDIOGRAM REPORT   Patient Name:   DENASHA GETTIS Date of Exam: 09/13/2022 Medical Rec #:  [id number]       Height:       67.0 in Accession #:    [id number]      Weight:       135.8 lb Date of Birth:  [birth year]      BSA:          1.715 m Patient Age:    81 years        BP:           162/89 mmHg Patient Gender: F               HR:           69 bpm. [facility] Procedure: 2D Echo, Cardiac Doppler and Color Doppler Indications:    Syncope R55  History:        Patient has prior history of Echocardiogram examinations, most                 recent 07/27/2022. CAD and Previous Myocardial Infarction, COPD,  Arrythmias:Atrial Flutter; Risk Factors:Hypertension and                 Dyslipidemia. Hx of breast cancer with left mastectomy.  Sonographer:    Celesta Gentile RCS  Referring Phys: [id number] ASIA B ZIERLE-GHOSH IMPRESSIONS  1. Left ventricular ejection fraction, by estimation, is 60 to 65%. The left ventricle has normal function. The left ventricle has no regional wall motion abnormalities. There is mild left ventricular hypertrophy. Left ventricular diastolic parameters are indeterminate. Elevated left atrial pressure.  2. Right ventricular systolic function is normal. The right ventricular size is normal.  3. The mitral valve is abnormal. Mild mitral valve regurgitation. No evidence of mitral stenosis.  4. The aortic valve is tricuspid. There is mild calcification of the aortic valve. There is mild thickening of the aortic valve. Aortic valve regurgitation is not visualized. No aortic stenosis is present.  5. The inferior vena cava is normal in size with greater than 50% respiratory variability, suggesting right atrial pressure of 3 mmHg. FINDINGS  Left Ventricle: Left ventricular ejection fraction, by estimation, is 60 to 65%. The left ventricle has normal function. The left ventricle has no regional wall motion abnormalities. The left ventricular internal cavity size was normal in size. There is  mild left ventricular hypertrophy. Left ventricular diastolic parameters are indeterminate. Elevated left atrial pressure. Right Ventricle: The right ventricular size is normal. Right vetricular wall thickness was not well visualized. Right ventricular systolic function is normal. Left Atrium: Left atrial size was normal in size. Right Atrium: Right atrial size was normal in size. Pericardium: There is no evidence of pericardial effusion. Mitral Valve: The mitral valve is abnormal. There is mild thickening of the mitral valve leaflet(s). There is mild calcification of the mitral valve leaflet(s). Mild mitral annular calcification. Mild mitral valve regurgitation. No evidence of mitral valve stenosis. Tricuspid Valve: The tricuspid valve is normal in structure. Tricuspid valve  regurgitation is trivial. No evidence of tricuspid stenosis. Aortic Valve: The aortic valve is tricuspid. There is mild calcification of the aortic valve. There is mild thickening of the aortic valve. There is mild aortic valve annular calcification. Aortic valve regurgitation is not visualized. No aortic stenosis  is present. Aortic valve mean gradient measures 3.6 mmHg. Aortic valve peak gradient measures 7.2 mmHg. Aortic valve area, by VTI measures 1.89 cm. Pulmonic Valve: The pulmonic valve was not well visualized. Pulmonic valve regurgitation is not visualized. No evidence of pulmonic stenosis. Aorta: The aortic root is normal in size and structure. Venous: The inferior vena cava is normal in size with greater than 50% respiratory variability, suggesting right atrial pressure of 3 mmHg. IAS/Shunts: No atrial level shunt detected by color flow Doppler.  LEFT VENTRICLE PLAX 2D LVIDd:         4.40 cm   Diastology LVIDs:         2.70 cm   LV e' medial:    4.90 cm/s LV PW:         1.10 cm   LV E/e' medial:  28.0 LV IVS:        1.10 cm   LV e' lateral:   7.72 cm/s LVOT diam:     1.80 cm   LV E/e' lateral: 17.7 LV SV:         58 LV SV Index:   34 LVOT Area:     2.54 cm  RIGHT VENTRICLE RV S prime:     12.60 cm/s TAPSE (M-mode): 2.5 cm LEFT ATRIUM  Index        RIGHT ATRIUM           Index LA diam:        3.10 cm 1.81 cm/m   RA Area:     12.00 cm LA Vol (A2C):   66.0 ml 38.47 ml/m  RA Volume:   26.80 ml  15.62 ml/m LA Vol (A4C):   41.7 ml 24.31 ml/m LA Biplane Vol: 56.4 ml 32.88 ml/m  AORTIC VALVE AV Area (Vmax):    2.18 cm AV Area (Vmean):   1.88 cm AV Area (VTI):     1.89 cm AV Vmax:           134.16 cm/s AV Vmean:          88.589 cm/s AV VTI:            0.309 m AV Peak Grad:      7.2 mmHg AV Mean Grad:      3.6 mmHg LVOT Vmax:         115.00 cm/s LVOT Vmean:        65.300 cm/s LVOT VTI:          0.229 m LVOT/AV VTI ratio: 0.74  AORTA Ao Root diam: 3.10 cm MITRAL VALVE MV Area (PHT): 2.05 cm      SHUNTS MV Decel Time: 370 msec     Systemic VTI:  0.23 m MV E velocity: 137.00 cm/s  Systemic Diam: 1.80 cm MV A velocity: 126.00 cm/s MV E/A ratio:  1.09 Dina Rich MD Electronically signed by Dina Rich MD Signature Date/Time: 09/13/2022/12:06:07 PM    Final    CT Angio Chest PE W and/or Wo Contrast  Result Date: 09/12/2022 CLINICAL DATA:  Fall out of bed 1 hour ago. Shortness of breath. PE suspected EXAM: CT ANGIOGRAPHY CHEST WITH CONTRAST TECHNIQUE: Multidetector CT imaging of the chest was performed using the standard protocol during bolus administration of intravenous contrast. Multiplanar CT image reconstructions and MIPs were obtained to evaluate the vascular anatomy. RADIATION DOSE REDUCTION: This exam was performed according to the departmental dose-optimization program which includes automated exposure control, adjustment of the mA and/or kV according to patient size and/or use of iterative reconstruction technique. CONTRAST:  60mL OMNIPAQUE IOHEXOL 350 MG/ML SOLN COMPARISON:  Chest radiograph 09/12/2022 and CT chest 02/09/2021 FINDINGS: Cardiovascular: Cardiomegaly. No pericardial effusion. Coronary artery and aortic atherosclerotic calcification. Satisfactory opacification of the pulmonary arteries to the segmental level. No pulmonary embolism. Mediastinum/Nodes: Small hiatal hernia. Trachea and esophagus are otherwise unremarkable. No thoracic adenopathy. Lungs/Pleura: Advanced emphysema. Diffuse interlobular septal thickening. Bronchial wall thickening, mucous plugging, and bronchiolectasis in the left lower lobe with centrilobular ground-glass nodules and tree-in-bud opacities. No pleural effusion or pneumothorax. Upper Abdomen: No acute abnormality. Musculoskeletal: No acute fracture. Review of the MIP images confirms the above findings. IMPRESSION: 1. No evidence of acute pulmonary embolism. 2. Small airway infection/inflammation in the left lower lobe. 3. Cardiomegaly with  interstitial pulmonary edema superimposed on a background of emphysema. Aortic Atherosclerosis (ICD10-I70.0) and Emphysema (ICD10-J43.9). Electronically Signed   By: Minerva Fester M.D.   On: 09/12/2022 21:33   CT Head Wo Contrast  Result Date: 09/12/2022 CLINICAL DATA:  Head trauma, intracranial arterial injury suspected. EXAM: CT HEAD WITHOUT CONTRAST TECHNIQUE: Contiguous axial images were obtained from the base of the skull through the vertex without intravenous contrast. RADIATION DOSE REDUCTION: This exam was performed according to the departmental dose-optimization program which includes automated exposure control, adjustment of the mA and/or  kV according to patient size and/or use of iterative reconstruction technique. COMPARISON:  Head CT 07/26/2022. FINDINGS: Brain: No acute hemorrhage. Unchanged mild chronic small-vessel disease. Cortical gray-white differentiation is otherwise preserved. Prominence of the ventricles and sulci within expected range for age. No hydrocephalus or extra-axial collection. No mass effect or midline shift. Vascular: No hyperdense vessel or unexpected calcification. Skull: No calvarial fracture or suspicious bone lesion. Skull base is unremarkable. Sinuses/Orbits: Unremarkable. Other: None. IMPRESSION: 1. No acute intracranial abnormality. 2. Unchanged mild chronic small-vessel disease. Electronically Signed   By: Orvan Falconer M.D.   On: 09/12/2022 19:01   DG Chest Port 1 View  Result Date: 09/12/2022 CLINICAL DATA:  Shortness of breath EXAM: PORTABLE CHEST 1 VIEW COMPARISON:  X-ray 09/08/2022 and older FINDINGS: Hyperinflation. Mild basilar atelectasis is increased. Chronic interstitial lung changes. No consolidation, pneumothorax or effusion. Stable cardiopericardial silhouette with calcified aorta. Overlapping cardiac leads. Osteopenia. IMPRESSION: Hyperinflation. Increasing but mild basilar atelectasis. Chronic interstitial lung changes Electronically Signed   By:  Karen Kays M.D.   On: 09/12/2022 17:58   DG Chest 2 View  Result Date: 09/08/2022 CLINICAL DATA:  Cough, shortness of breath. EXAM: CHEST - 2 VIEW COMPARISON:  July 26, 2022. FINDINGS: The heart size and mediastinal contours are within normal limits. Minimal bibasilar subsegmental atelectasis is noted. The visualized skeletal structures are unremarkable. IMPRESSION: Minimal bibasilar subsegmental atelectasis. Electronically Signed   By: Lupita Raider M.D.   On: 09/08/2022 09:30    Recent Labs: Lab Results  Component Value Date   WBC 3.6 (L) 09/13/2022   HGB 10.9 (L) 09/13/2022   PLT 74 (L) 09/13/2022   NA 138 09/13/2022   K 4.8 09/13/2022   CL 111 09/13/2022   CO2 19 (L) 09/13/2022   GLUCOSE 86 09/13/2022   BUN 35 (H) 09/13/2022   CREATININE 1.54 (H) 09/13/2022   BILITOT 0.5 09/13/2022   ALKPHOS 73 09/13/2022   AST 29 09/13/2022   ALT 15 09/13/2022   PROT 6.2 (L) 09/13/2022   ALBUMIN 3.2 (L) 09/13/2022   CALCIUM 9.1 09/13/2022   GFRAA 38 (L) 07/06/2019   October 01, 2021 ANA 1: 1280NH, dsDNA negative, complements normal, sed rate 11, phosphatidyl serine antibody> 150  Speciality Comments: No specialty comments available.  Procedures:  No procedures performed Allergies: Sulfa antibiotics   Assessment / Plan:     Visit Diagnoses: Positive ANA (antinuclear antibody) - ANA 1: 1280NH, aPS IgM >150, thrombocytopenia, history of Raynauds and sicca symptoms: -She continues to have positive ANA and positive antiphosphatidylserine antibodies.  She had no episodes of arterial or venous thrombosis.  She continues to be thrombocytopenic.  She gives history of fatigue, dry mouth and dry eyes.  Over-the-counter products for sicca symptoms were discussed.  She had no other clinical features of autoimmune disease.  Raynauds is mild during the winter months.  She had good capillary refill with no nailbed capillary changes or sclerodactyly.  She continues to have some shortness of breath due to  COPD and recent pneumonia.  Recheck labs today.  Plan: Protein / creatinine ratio, urine, Anti-DNA antibody, double-stranded, C3 and C4, ANA, Phosphatidylserine/Prothrombin (PS/PT) Antibodies (IgG, IgM).  CBC and CMP were reviewed from recent hospitalization in June 2024.  White cell count was low most likely due to recent infection.  Hemoglobin was low and stable.  Platelets were low and stable.  Will contact her once lab results are available.  Raynaud's syndrome without gangrene-she had no nailbed capillary changes or sclerodactyly on  the examination.  Keeping core temperature warm and warm clothing during the winter months was advised.  Chronic anticoagulation-patient is on Eliquis due to atrial fibrillation.  Primary osteoarthritis of both hands-she has mild osteoarthritis in her hands which is not bothersome.  Joint protection was discussed.  Trochanteric bursitis, left hip - Resolved  History of COPD - Previously followed by Dr. Isaiah Serge. HRCT 2018 was negative for interstitial lung disease.  Patient had recent hospitalization for community-acquired pneumonia which was treated and she had good recovery.  Age-related osteoporosis without current pathological fracture - Followed by Dr. [name]. DEXA 06/29/21: The BMD measured at Femur Neck Left is 0.668 g/cm2 with a T-score of -2.7.Prolia 60 mg subcu by her PCP.  Use of calcium rich diet and vitamin D was discussed.  Thrombocytopenia (HCC)-platelets continue to be low and stable.  She is followed by hematology.  She is on Eliquis for atrial fibrillation.  Iron deficiency anemia-patient is followed by hematology.  I reviewed the records.  She was diagnosed with severe copper deficiency and moderate iron deficiency.  Chronic kidney disease probably contributes to anemia as well.  Essential hypertension-blood pressure was elevated at 155/77.  Repeat blood pressure was 170/74.  Patient was advised to monitor blood pressure closely and follow-up with the  PCP.  CAD in native artery  NSTEMI (non-ST elevated myocardial infarction) (HCC)  Sinus bradycardia  S/P angioplasty with stent 03/15/16 DES Resolute, pLCX  Dyslipidemia  Atrial flutter with rapid ventricular response (HCC)-she is on chronic anticoagulation.  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  History of recent fall-patient gives history of recurrent falls.  Patient states she is getting physical therapy at home.  She walked without any assistance in the office today.  Use of cane or walker was advised.  Patient stated she uses cane and walker at home.  Orders: Orders Placed This Encounter  Procedures   Protein / creatinine ratio, urine   Anti-DNA antibody, double-stranded   C3 and C4   ANA   Phosphatidylserine/Prothrombin (PS/PT) Antibodies (IgG, IgM)   No orders of the defined types were placed in this encounter.    Follow-Up Instructions: No follow-ups on file.   Pollyann Savoy, MD  Note - This record has been created using Animal nutritionist.  Chart creation errors have been sought, but may not always  have been located. Such creation errors do not reflect on  the standard of medical care.

## 2022-09-20 DIAGNOSIS — Z79899 Other long term (current) drug therapy: Secondary | ICD-10-CM | POA: Diagnosis not present

## 2022-09-20 DIAGNOSIS — Z9012 Acquired absence of left breast and nipple: Secondary | ICD-10-CM | POA: Diagnosis not present

## 2022-09-20 DIAGNOSIS — Z7901 Long term (current) use of anticoagulants: Secondary | ICD-10-CM | POA: Diagnosis not present

## 2022-09-20 DIAGNOSIS — J9811 Atelectasis: Secondary | ICD-10-CM | POA: Diagnosis not present

## 2022-09-20 DIAGNOSIS — E039 Hypothyroidism, unspecified: Secondary | ICD-10-CM | POA: Diagnosis not present

## 2022-09-20 DIAGNOSIS — I4891 Unspecified atrial fibrillation: Secondary | ICD-10-CM | POA: Diagnosis not present

## 2022-09-20 DIAGNOSIS — Z7952 Long term (current) use of systemic steroids: Secondary | ICD-10-CM | POA: Diagnosis not present

## 2022-09-20 DIAGNOSIS — I4892 Unspecified atrial flutter: Secondary | ICD-10-CM | POA: Diagnosis not present

## 2022-09-20 DIAGNOSIS — N179 Acute kidney failure, unspecified: Secondary | ICD-10-CM | POA: Diagnosis not present

## 2022-09-20 DIAGNOSIS — J440 Chronic obstructive pulmonary disease with acute lower respiratory infection: Secondary | ICD-10-CM | POA: Diagnosis not present

## 2022-09-20 DIAGNOSIS — I2510 Atherosclerotic heart disease of native coronary artery without angina pectoris: Secondary | ICD-10-CM | POA: Diagnosis not present

## 2022-09-20 DIAGNOSIS — I083 Combined rheumatic disorders of mitral, aortic and tricuspid valves: Secondary | ICD-10-CM | POA: Diagnosis not present

## 2022-09-20 DIAGNOSIS — Z7951 Long term (current) use of inhaled steroids: Secondary | ICD-10-CM | POA: Diagnosis not present

## 2022-09-20 DIAGNOSIS — J439 Emphysema, unspecified: Secondary | ICD-10-CM | POA: Diagnosis not present

## 2022-09-20 DIAGNOSIS — I700 Atherosclerosis of aorta: Secondary | ICD-10-CM | POA: Diagnosis not present

## 2022-09-20 DIAGNOSIS — E785 Hyperlipidemia, unspecified: Secondary | ICD-10-CM | POA: Diagnosis not present

## 2022-09-20 DIAGNOSIS — Z853 Personal history of malignant neoplasm of breast: Secondary | ICD-10-CM | POA: Diagnosis not present

## 2022-09-20 DIAGNOSIS — J441 Chronic obstructive pulmonary disease with (acute) exacerbation: Secondary | ICD-10-CM | POA: Diagnosis not present

## 2022-09-20 DIAGNOSIS — I119 Hypertensive heart disease without heart failure: Secondary | ICD-10-CM | POA: Diagnosis not present

## 2022-09-20 DIAGNOSIS — I252 Old myocardial infarction: Secondary | ICD-10-CM | POA: Diagnosis not present

## 2022-09-20 DIAGNOSIS — Z9181 History of falling: Secondary | ICD-10-CM | POA: Diagnosis not present

## 2022-09-23 ENCOUNTER — Other Ambulatory Visit (HOSPITAL_COMMUNITY): Payer: Self-pay | Admitting: Family Medicine

## 2022-09-23 DIAGNOSIS — I2510 Atherosclerotic heart disease of native coronary artery without angina pectoris: Secondary | ICD-10-CM | POA: Diagnosis not present

## 2022-09-23 DIAGNOSIS — J440 Chronic obstructive pulmonary disease with acute lower respiratory infection: Secondary | ICD-10-CM | POA: Diagnosis not present

## 2022-09-23 DIAGNOSIS — Z79899 Other long term (current) drug therapy: Secondary | ICD-10-CM | POA: Diagnosis not present

## 2022-09-23 DIAGNOSIS — J9811 Atelectasis: Secondary | ICD-10-CM | POA: Diagnosis not present

## 2022-09-23 DIAGNOSIS — E039 Hypothyroidism, unspecified: Secondary | ICD-10-CM | POA: Diagnosis not present

## 2022-09-23 DIAGNOSIS — I083 Combined rheumatic disorders of mitral, aortic and tricuspid valves: Secondary | ICD-10-CM | POA: Diagnosis not present

## 2022-09-23 DIAGNOSIS — Z9012 Acquired absence of left breast and nipple: Secondary | ICD-10-CM | POA: Diagnosis not present

## 2022-09-23 DIAGNOSIS — Z87448 Personal history of other diseases of urinary system: Secondary | ICD-10-CM | POA: Diagnosis not present

## 2022-09-23 DIAGNOSIS — E559 Vitamin D deficiency, unspecified: Secondary | ICD-10-CM | POA: Diagnosis not present

## 2022-09-23 DIAGNOSIS — Z7901 Long term (current) use of anticoagulants: Secondary | ICD-10-CM | POA: Diagnosis not present

## 2022-09-23 DIAGNOSIS — J441 Chronic obstructive pulmonary disease with (acute) exacerbation: Secondary | ICD-10-CM | POA: Diagnosis not present

## 2022-09-23 DIAGNOSIS — I4891 Unspecified atrial fibrillation: Secondary | ICD-10-CM

## 2022-09-23 DIAGNOSIS — I700 Atherosclerosis of aorta: Secondary | ICD-10-CM | POA: Diagnosis not present

## 2022-09-23 DIAGNOSIS — J189 Pneumonia, unspecified organism: Secondary | ICD-10-CM

## 2022-09-23 DIAGNOSIS — I119 Hypertensive heart disease without heart failure: Secondary | ICD-10-CM | POA: Diagnosis not present

## 2022-09-23 DIAGNOSIS — I480 Paroxysmal atrial fibrillation: Secondary | ICD-10-CM

## 2022-09-23 DIAGNOSIS — Z8679 Personal history of other diseases of the circulatory system: Secondary | ICD-10-CM | POA: Diagnosis not present

## 2022-09-23 DIAGNOSIS — N179 Acute kidney failure, unspecified: Secondary | ICD-10-CM | POA: Diagnosis not present

## 2022-09-23 DIAGNOSIS — Z7952 Long term (current) use of systemic steroids: Secondary | ICD-10-CM | POA: Diagnosis not present

## 2022-09-23 DIAGNOSIS — K5900 Constipation, unspecified: Secondary | ICD-10-CM | POA: Diagnosis not present

## 2022-09-23 DIAGNOSIS — J439 Emphysema, unspecified: Secondary | ICD-10-CM | POA: Diagnosis not present

## 2022-09-23 DIAGNOSIS — Z7951 Long term (current) use of inhaled steroids: Secondary | ICD-10-CM | POA: Diagnosis not present

## 2022-09-23 DIAGNOSIS — I4892 Unspecified atrial flutter: Secondary | ICD-10-CM | POA: Diagnosis not present

## 2022-09-23 DIAGNOSIS — I252 Old myocardial infarction: Secondary | ICD-10-CM | POA: Diagnosis not present

## 2022-09-23 DIAGNOSIS — D649 Anemia, unspecified: Secondary | ICD-10-CM | POA: Diagnosis not present

## 2022-09-23 DIAGNOSIS — I10 Essential (primary) hypertension: Secondary | ICD-10-CM | POA: Diagnosis not present

## 2022-09-23 DIAGNOSIS — E785 Hyperlipidemia, unspecified: Secondary | ICD-10-CM | POA: Diagnosis not present

## 2022-09-23 DIAGNOSIS — J449 Chronic obstructive pulmonary disease, unspecified: Secondary | ICD-10-CM | POA: Diagnosis not present

## 2022-09-23 DIAGNOSIS — Z853 Personal history of malignant neoplasm of breast: Secondary | ICD-10-CM | POA: Diagnosis not present

## 2022-09-23 DIAGNOSIS — Z9181 History of falling: Secondary | ICD-10-CM | POA: Diagnosis not present

## 2022-09-28 DIAGNOSIS — J441 Chronic obstructive pulmonary disease with (acute) exacerbation: Secondary | ICD-10-CM | POA: Diagnosis not present

## 2022-09-28 DIAGNOSIS — Z79899 Other long term (current) drug therapy: Secondary | ICD-10-CM | POA: Diagnosis not present

## 2022-09-28 DIAGNOSIS — I119 Hypertensive heart disease without heart failure: Secondary | ICD-10-CM | POA: Diagnosis not present

## 2022-09-28 DIAGNOSIS — I700 Atherosclerosis of aorta: Secondary | ICD-10-CM | POA: Diagnosis not present

## 2022-09-28 DIAGNOSIS — J440 Chronic obstructive pulmonary disease with acute lower respiratory infection: Secondary | ICD-10-CM | POA: Diagnosis not present

## 2022-09-28 DIAGNOSIS — I083 Combined rheumatic disorders of mitral, aortic and tricuspid valves: Secondary | ICD-10-CM | POA: Diagnosis not present

## 2022-09-28 DIAGNOSIS — Z7951 Long term (current) use of inhaled steroids: Secondary | ICD-10-CM | POA: Diagnosis not present

## 2022-09-28 DIAGNOSIS — E039 Hypothyroidism, unspecified: Secondary | ICD-10-CM | POA: Diagnosis not present

## 2022-09-28 DIAGNOSIS — Z9012 Acquired absence of left breast and nipple: Secondary | ICD-10-CM | POA: Diagnosis not present

## 2022-09-28 DIAGNOSIS — Z9181 History of falling: Secondary | ICD-10-CM | POA: Diagnosis not present

## 2022-09-28 DIAGNOSIS — J9811 Atelectasis: Secondary | ICD-10-CM | POA: Diagnosis not present

## 2022-09-28 DIAGNOSIS — E785 Hyperlipidemia, unspecified: Secondary | ICD-10-CM | POA: Diagnosis not present

## 2022-09-28 DIAGNOSIS — J439 Emphysema, unspecified: Secondary | ICD-10-CM | POA: Diagnosis not present

## 2022-09-28 DIAGNOSIS — Z853 Personal history of malignant neoplasm of breast: Secondary | ICD-10-CM | POA: Diagnosis not present

## 2022-09-28 DIAGNOSIS — I4891 Unspecified atrial fibrillation: Secondary | ICD-10-CM | POA: Diagnosis not present

## 2022-09-28 DIAGNOSIS — I2510 Atherosclerotic heart disease of native coronary artery without angina pectoris: Secondary | ICD-10-CM | POA: Diagnosis not present

## 2022-09-28 DIAGNOSIS — Z7952 Long term (current) use of systemic steroids: Secondary | ICD-10-CM | POA: Diagnosis not present

## 2022-09-28 DIAGNOSIS — I4892 Unspecified atrial flutter: Secondary | ICD-10-CM | POA: Diagnosis not present

## 2022-09-28 DIAGNOSIS — Z7901 Long term (current) use of anticoagulants: Secondary | ICD-10-CM | POA: Diagnosis not present

## 2022-09-28 DIAGNOSIS — I252 Old myocardial infarction: Secondary | ICD-10-CM | POA: Diagnosis not present

## 2022-09-28 DIAGNOSIS — N179 Acute kidney failure, unspecified: Secondary | ICD-10-CM | POA: Diagnosis not present

## 2022-10-01 ENCOUNTER — Ambulatory Visit: Payer: 59 | Attending: Rheumatology | Admitting: Rheumatology

## 2022-10-01 ENCOUNTER — Encounter: Payer: Self-pay | Admitting: Rheumatology

## 2022-10-01 VITALS — BP 170/74 | HR 51 | Resp 14 | Ht 67.0 in | Wt 141.2 lb

## 2022-10-01 DIAGNOSIS — M62838 Other muscle spasm: Secondary | ICD-10-CM

## 2022-10-01 DIAGNOSIS — I7300 Raynaud's syndrome without gangrene: Secondary | ICD-10-CM | POA: Diagnosis not present

## 2022-10-01 DIAGNOSIS — Z8709 Personal history of other diseases of the respiratory system: Secondary | ICD-10-CM | POA: Diagnosis not present

## 2022-10-01 DIAGNOSIS — H353221 Exudative age-related macular degeneration, left eye, with active choroidal neovascularization: Secondary | ICD-10-CM

## 2022-10-01 DIAGNOSIS — R768 Other specified abnormal immunological findings in serum: Secondary | ICD-10-CM

## 2022-10-01 DIAGNOSIS — I083 Combined rheumatic disorders of mitral, aortic and tricuspid valves: Secondary | ICD-10-CM | POA: Diagnosis not present

## 2022-10-01 DIAGNOSIS — I700 Atherosclerosis of aorta: Secondary | ICD-10-CM | POA: Diagnosis not present

## 2022-10-01 DIAGNOSIS — D508 Other iron deficiency anemias: Secondary | ICD-10-CM

## 2022-10-01 DIAGNOSIS — Z7951 Long term (current) use of inhaled steroids: Secondary | ICD-10-CM | POA: Diagnosis not present

## 2022-10-01 DIAGNOSIS — E039 Hypothyroidism, unspecified: Secondary | ICD-10-CM | POA: Diagnosis not present

## 2022-10-01 DIAGNOSIS — I4891 Unspecified atrial fibrillation: Secondary | ICD-10-CM | POA: Diagnosis not present

## 2022-10-01 DIAGNOSIS — I4892 Unspecified atrial flutter: Secondary | ICD-10-CM | POA: Diagnosis not present

## 2022-10-01 DIAGNOSIS — Z95820 Peripheral vascular angioplasty status with implants and grafts: Secondary | ICD-10-CM | POA: Diagnosis not present

## 2022-10-01 DIAGNOSIS — N184 Chronic kidney disease, stage 4 (severe): Secondary | ICD-10-CM

## 2022-10-01 DIAGNOSIS — I252 Old myocardial infarction: Secondary | ICD-10-CM | POA: Diagnosis not present

## 2022-10-01 DIAGNOSIS — M19042 Primary osteoarthritis, left hand: Secondary | ICD-10-CM

## 2022-10-01 DIAGNOSIS — Z7901 Long term (current) use of anticoagulants: Secondary | ICD-10-CM | POA: Diagnosis not present

## 2022-10-01 DIAGNOSIS — J441 Chronic obstructive pulmonary disease with (acute) exacerbation: Secondary | ICD-10-CM | POA: Diagnosis not present

## 2022-10-01 DIAGNOSIS — I214 Non-ST elevation (NSTEMI) myocardial infarction: Secondary | ICD-10-CM | POA: Diagnosis not present

## 2022-10-01 DIAGNOSIS — R1319 Other dysphagia: Secondary | ICD-10-CM

## 2022-10-01 DIAGNOSIS — Z9012 Acquired absence of left breast and nipple: Secondary | ICD-10-CM | POA: Diagnosis not present

## 2022-10-01 DIAGNOSIS — M7062 Trochanteric bursitis, left hip: Secondary | ICD-10-CM

## 2022-10-01 DIAGNOSIS — Z853 Personal history of malignant neoplasm of breast: Secondary | ICD-10-CM | POA: Diagnosis not present

## 2022-10-01 DIAGNOSIS — Z9181 History of falling: Secondary | ICD-10-CM

## 2022-10-01 DIAGNOSIS — I2510 Atherosclerotic heart disease of native coronary artery without angina pectoris: Secondary | ICD-10-CM

## 2022-10-01 DIAGNOSIS — I10 Essential (primary) hypertension: Secondary | ICD-10-CM | POA: Diagnosis not present

## 2022-10-01 DIAGNOSIS — M19041 Primary osteoarthritis, right hand: Secondary | ICD-10-CM | POA: Diagnosis not present

## 2022-10-01 DIAGNOSIS — Z7952 Long term (current) use of systemic steroids: Secondary | ICD-10-CM | POA: Diagnosis not present

## 2022-10-01 DIAGNOSIS — M810 Age-related osteoporosis without current pathological fracture: Secondary | ICD-10-CM

## 2022-10-01 DIAGNOSIS — R001 Bradycardia, unspecified: Secondary | ICD-10-CM

## 2022-10-01 DIAGNOSIS — R7689 Other specified abnormal immunological findings in serum: Secondary | ICD-10-CM

## 2022-10-01 DIAGNOSIS — Z79899 Other long term (current) drug therapy: Secondary | ICD-10-CM | POA: Diagnosis not present

## 2022-10-01 DIAGNOSIS — J440 Chronic obstructive pulmonary disease with acute lower respiratory infection: Secondary | ICD-10-CM | POA: Diagnosis not present

## 2022-10-01 DIAGNOSIS — E785 Hyperlipidemia, unspecified: Secondary | ICD-10-CM | POA: Diagnosis not present

## 2022-10-01 DIAGNOSIS — J439 Emphysema, unspecified: Secondary | ICD-10-CM | POA: Diagnosis not present

## 2022-10-01 DIAGNOSIS — D696 Thrombocytopenia, unspecified: Secondary | ICD-10-CM | POA: Diagnosis not present

## 2022-10-01 DIAGNOSIS — N179 Acute kidney failure, unspecified: Secondary | ICD-10-CM | POA: Diagnosis not present

## 2022-10-01 DIAGNOSIS — J9811 Atelectasis: Secondary | ICD-10-CM | POA: Diagnosis not present

## 2022-10-01 DIAGNOSIS — I119 Hypertensive heart disease without heart failure: Secondary | ICD-10-CM | POA: Diagnosis not present

## 2022-10-02 ENCOUNTER — Emergency Department (HOSPITAL_COMMUNITY): Payer: 59

## 2022-10-02 ENCOUNTER — Other Ambulatory Visit: Payer: Self-pay

## 2022-10-02 ENCOUNTER — Emergency Department (HOSPITAL_COMMUNITY)
Admission: EM | Admit: 2022-10-02 | Discharge: 2022-10-02 | Disposition: A | Discharge status: Home / Self Care | Payer: 59 | Attending: Emergency Medicine | Admitting: Emergency Medicine

## 2022-10-02 DIAGNOSIS — Z7951 Long term (current) use of inhaled steroids: Secondary | ICD-10-CM | POA: Insufficient documentation

## 2022-10-02 DIAGNOSIS — S20419A Abrasion of unspecified back wall of thorax, initial encounter: Secondary | ICD-10-CM

## 2022-10-02 DIAGNOSIS — W06XXXA Fall from bed, initial encounter: Secondary | ICD-10-CM | POA: Insufficient documentation

## 2022-10-02 DIAGNOSIS — J449 Chronic obstructive pulmonary disease, unspecified: Secondary | ICD-10-CM | POA: Insufficient documentation

## 2022-10-02 DIAGNOSIS — I2510 Atherosclerotic heart disease of native coronary artery without angina pectoris: Secondary | ICD-10-CM | POA: Insufficient documentation

## 2022-10-02 DIAGNOSIS — Z7901 Long term (current) use of anticoagulants: Secondary | ICD-10-CM | POA: Diagnosis not present

## 2022-10-02 DIAGNOSIS — Z79899 Other long term (current) drug therapy: Secondary | ICD-10-CM | POA: Diagnosis not present

## 2022-10-02 DIAGNOSIS — I700 Atherosclerosis of aorta: Secondary | ICD-10-CM | POA: Diagnosis not present

## 2022-10-02 DIAGNOSIS — S59912A Unspecified injury of left forearm, initial encounter: Secondary | ICD-10-CM | POA: Diagnosis present

## 2022-10-02 DIAGNOSIS — S51812A Laceration without foreign body of left forearm, initial encounter: Secondary | ICD-10-CM | POA: Diagnosis not present

## 2022-10-02 DIAGNOSIS — I10 Essential (primary) hypertension: Secondary | ICD-10-CM | POA: Diagnosis not present

## 2022-10-02 DIAGNOSIS — Z853 Personal history of malignant neoplasm of breast: Secondary | ICD-10-CM | POA: Diagnosis not present

## 2022-10-02 DIAGNOSIS — Z043 Encounter for examination and observation following other accident: Secondary | ICD-10-CM | POA: Diagnosis not present

## 2022-10-02 DIAGNOSIS — W19XXXA Unspecified fall, initial encounter: Secondary | ICD-10-CM

## 2022-10-02 LAB — PROTEIN / CREATININE RATIO, URINE
Creatinine, Urine: 81 mg/dL (ref 20–275)
Total Protein, Urine: 31 mg/dL — ABNORMAL HIGH (ref 5–24)

## 2022-10-02 MED ORDER — TRIPLE ANTIBIOTIC 3.5-400-5000 EX OINT
1.0000 | TOPICAL_OINTMENT | Freq: Once | CUTANEOUS | Status: AC
  Administered 2022-10-02: 1 via CUTANEOUS
  Filled 2022-10-02: qty 1

## 2022-10-02 NOTE — ED Triage Notes (Signed)
Pt bib family from home for fall, pt slid off matress hitting left arm and mid back on nightstand. Skin tears noted to left arm, covered with bandages placed at home, bleeding controlled at this time. Skin tear and bruising noted to mid back- no active bleeding.

## 2022-10-02 NOTE — ED Provider Notes (Signed)
[facility] AT [facility] Provider Note   CSN: [id number] Arrival date & time: 10/02/22  2118     History  Chief Complaint  Patient presents with   Patricia Jackson is a 82 y.o. female.  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  Pt has had multiple falls out of bed.  Pt said her mattress is very soft and she slides out.  She sustained skin tears to her left forearm and her back.  She denies hitting her head.  No loc.       Home Medications Prior to Admission medications   Medication Sig Start Date End Date Taking? Authorizing Provider  acetaminophen (TYLENOL) 500 MG tablet Take by mouth as needed. 03/21/17   [provider]  albuterol (PROVENTIL) (2.5 MG/3ML) 0.083% nebulizer solution Take 2.5 mg by nebulization every 6 (six) hours as needed for wheezing or shortness of breath.    [provider]  albuterol (VENTOLIN HFA) 108 (90 Base) MCG/ACT inhaler Inhale 1-2 puffs into the lungs every 6 (six) hours as needed for wheezing or shortness of breath. 03/02/22   Particia Nearing, PA-C  amLODipine-olmesartan (AZOR) 10-40 MG tablet Take 1 tablet by mouth daily.    [provider]  apixaban (ELIQUIS) 5 MG TABS tablet TAKE ONE TABLET (5MG  TOTAL) BY MOUTH TWOTIMES DAILY Patient taking differently: Take 5 mg by mouth 2 (two) times daily. 03/01/22   Quintella Reichert, MD  buPROPion (WELLBUTRIN XL) 300 MG 24 hr tablet Take 300 mg by mouth daily.    [provider]  Cholecalciferol (VITAMIN D3) 50 MCG (2000 UT) TABS Take 2,000 Units by mouth daily.    [provider]  Copper Gluconate (COPPER CAPS PO) Take 6 mg by mouth daily.    [provider]  denosumab (PROLIA) 60 MG/ML SOSY injection Inject 60 mg into the skin every 6 (six) months.    [provider]  diclofenac Sodium (VOLTAREN) 1 % GEL APPLY 2 GRAMS TO THE  AFFECTED AREA(S) BY TOPICAL ROUTE 4 TIMES PER DAY 06/02/22   [provider]  fluticasone (FLONASE) 50 MCG/ACT nasal spray Place 2 sprays into both nostrils daily. 06/23/21   Rodolph Bong, MD  guaiFENesin (MUCINEX) 600 MG 12 hr tablet Take 1 tablet (600 mg total) by mouth 2 (two) times daily as needed. 07/29/22   Kendell Bane, MD  levothyroxine (SYNTHROID) 50 MCG tablet Take 50 mcg by mouth daily before breakfast.  09/20/19   [provider]  metoprolol succinate (TOPROL XL) 25 MG 24 hr tablet Take 1 tablet (25 mg total) by mouth daily. 09/02/21   Quintella Reichert, MD  nitroGLYCERIN (NITROSTAT) 0.4 MG SL tablet Place 0.4 mg under the tongue every 5 (five) minutes as needed for chest pain. 06/09/19   [provider]  pantoprazole (PROTONIX) 40 MG tablet Take 1 tablet (40 mg total) by mouth daily. 09/14/22 10/14/22  Vassie Loll, MD  predniSONE (DELTASONE) 20 MG tablet Take 3 tabs by Mouth daily x 1 day; then 2 tablets by mouth daily x 2 days; then 1 tablet by mouth daily x 3 days; then half tablet by mouth daily x 3 days and stop prednisone. Patient not taking: Reported on 10/01/2022 09/14/22   Vassie Loll, MD  rosuvastatin (CRESTOR) 20 MG tablet Take 20 mg by mouth daily.  02/26/17   [provider]  sertraline (ZOLOFT) 25 MG tablet Take 25 mg by mouth at bedtime.    [provider]  TRELEGY ELLIPTA 100-62.5-25 MCG/ACT AEPB Inhale 1 puff into the lungs daily. 06/13/21   [provider]      Allergies    Sulfa antibiotics    Review of Systems   Review of Systems  Skin:  Positive for wound.  All other systems reviewed and are negative.   Physical Exam Updated Vital Signs BP (!) 144/70   Pulse 76   Temp 97.7 F (36.5 C) (Oral)   Resp 20   Ht 5\' 7"  (1.702 m)   Wt 64.9 kg   SpO2 100%   BMI 22.41 kg/m  Physical Exam Vitals and nursing note reviewed.  Constitutional:      Appearance: Normal appearance.  HENT:     Head:  Normocephalic and atraumatic.     Right Ear: External ear normal.     Left Ear: External ear normal.     Nose: Nose normal.     Mouth/Throat:     Mouth: Mucous membranes are moist.     Pharynx: Oropharynx is clear.  Eyes:     Extraocular Movements: Extraocular movements intact.     Conjunctiva/sclera: Conjunctivae normal.     Pupils: Pupils are equal, round, and reactive to light.  Cardiovascular:     Rate and Rhythm: Normal rate and regular rhythm.     Pulses: Normal pulses.     Heart sounds: Normal heart sounds.  Pulmonary:     Effort: Pulmonary effort is normal.     Breath sounds: Normal breath sounds.  Abdominal:     General: Abdomen is flat. Bowel sounds are normal.     Palpations: Abdomen is soft.  Musculoskeletal:        General: Normal range of motion.     Cervical back: Normal range of motion and neck supple.     Comments: Left wrist deformity from prior fx  Skin:    Capillary Refill: Capillary refill takes less than 2 seconds.     Comments: Skin tears to left forearm and to mid back  Neurological:     General: No focal deficit present.     Mental Status: She is alert and oriented to person, place, and time.  Psychiatric:        Mood and Affect: Mood normal.        Behavior: Behavior normal.     ED Results / Procedures / Treatments   Labs (all labs ordered are listed, but only abnormal results are displayed) Labs Reviewed - No data to display  EKG None  Radiology DG Chest 2 View  Result Date: 10/02/2022 CLINICAL DATA:  Fall EXAM: CHEST - 2 VIEW COMPARISON:  09/12/2022 FINDINGS: Lungs are clear.  No pleural effusion or pneumothorax. The heart is normal in size.  Thoracic aortic atherosclerosis. Suspected left mastectomy with left axillary lymph node dissection. Mild degenerative changes of the thoracic spine. IMPRESSION: No acute cardiopulmonary disease. Electronically Signed   By: Charline Bills M.D.   On: 10/02/2022 22:36   DG Forearm Left  Result  Date: 10/02/2022 CLINICAL DATA:  Fall EXAM: LEFT FOREARM - 2 VIEW COMPARISON:  Left wrist radiograph dated 04/26/2022 FINDINGS: Deformity/angulation of the distal radius, likely reflecting a healed fracture deformity when correlating with prior wrist radiographs. The joint spaces are preserved. The visualized soft tissues are unremarkable. IMPRESSION: Healed fracture deformity involving the distal radius, as above. No acute osseous abnormality. Electronically Signed  By: Charline Bills M.D.   On: 10/02/2022 22:34    Procedures Procedures    Medications Ordered in ED Medications  neomycin-bacitracin-polymyxin [phone] OINT 1 Application (has no administration in time range)    ED Course/ Medical Decision Making/ A&P                             Medical Decision Making Amount and/or Complexity of Data Reviewed Radiology: ordered.  Risk OTC drugs.   This patient presents to the ED for concern of fall, this involves an extensive number of treatment options, and is a complaint that carries with it a high risk of complications and morbidity.  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    No acute osseous abnormality.   CXR: No acute cardiopulmonary disease.  I agree with the radiologist interpretation   Medicines ordered and prescription drug management:   I have reviewed the patients home medicines and have made adjustments as needed   Test  Considered:  Ct, but no head trauma   Problem List / ED Course:  Skin tears:  wounds cleaned/dressed.  No fx. Fall:  pt is stable for d/c.  Return if worse.  F/u with pcp.   Reevaluation:  After the interventions noted above, I reevaluated the patient and found that they have :improved   Social Determinants of Health:  Lives at home   Dispostion:  After consideration of the diagnostic results and the patients response to treatment, I feel that the patent would benefit from discharge with outpatient f/u.          Final Clinical Impression(s) / ED Diagnoses Final diagnoses:  Fall, initial encounter  Skin tear of left forearm without complication, initial encounter  Abrasion of back, unspecified laterality, initial encounter    Rx / DC Orders ED Discharge Orders     None         Jacalyn Lefevre, MD 10/02/22 2256

## 2022-10-02 NOTE — ED Notes (Signed)
Non-adh pad, abd pad and coban applied to left arm

## 2022-10-02 NOTE — ED Notes (Signed)
Patient transported to X-ray 

## 2022-10-05 ENCOUNTER — Other Ambulatory Visit: Payer: Self-pay | Admitting: *Deleted

## 2022-10-05 DIAGNOSIS — R809 Proteinuria, unspecified: Secondary | ICD-10-CM

## 2022-10-05 LAB — PROTEIN / CREATININE RATIO, URINE
Protein/Creat Ratio: 383 mg/g creat — ABNORMAL HIGH (ref 24–184)
Protein/Creatinine Ratio: 0.383 mg/mg creat — ABNORMAL HIGH (ref 0.024–0.184)

## 2022-10-05 LAB — ANTI-NUCLEAR AB-TITER (ANA TITER): ANA Titer 1: 1:1280 {titer} — ABNORMAL HIGH

## 2022-10-05 LAB — ANA: Anti Nuclear Antibody (ANA): POSITIVE — AB

## 2022-10-05 LAB — ANTI-DNA ANTIBODY, DOUBLE-STRANDED: ds DNA Ab: 1 IU/mL

## 2022-10-05 LAB — C3 AND C4
C3 Complement: 116 mg/dL (ref 83–193)
C4 Complement: 16 mg/dL (ref 15–57)

## 2022-10-05 LAB — PHOSPHATIDYLSERINE/PROTHROMBIN (PS/PT) ANTIBODIES (IGG, IGM)
Phosphatidylserine/Prothrombin Ab (IgG): 12 U (ref <=30)
Phosphatidylserine/Prothrombin Ab (IgM): >150 U — ABNORMAL HIGH (ref <=30)

## 2022-10-05 NOTE — Progress Notes (Signed)
Proteinuria noted.  Please inquire if patient is seeing nephrology for chronic kidney disease.  If not then please send in nephrology referral otherwise forward results to her nephrologist.

## 2022-10-06 DIAGNOSIS — J9811 Atelectasis: Secondary | ICD-10-CM | POA: Diagnosis not present

## 2022-10-06 DIAGNOSIS — I083 Combined rheumatic disorders of mitral, aortic and tricuspid valves: Secondary | ICD-10-CM | POA: Diagnosis not present

## 2022-10-06 DIAGNOSIS — Z79899 Other long term (current) drug therapy: Secondary | ICD-10-CM | POA: Diagnosis not present

## 2022-10-06 DIAGNOSIS — J439 Emphysema, unspecified: Secondary | ICD-10-CM | POA: Diagnosis not present

## 2022-10-06 DIAGNOSIS — J440 Chronic obstructive pulmonary disease with acute lower respiratory infection: Secondary | ICD-10-CM | POA: Diagnosis not present

## 2022-10-06 DIAGNOSIS — Z7901 Long term (current) use of anticoagulants: Secondary | ICD-10-CM | POA: Diagnosis not present

## 2022-10-06 DIAGNOSIS — Z7952 Long term (current) use of systemic steroids: Secondary | ICD-10-CM | POA: Diagnosis not present

## 2022-10-06 DIAGNOSIS — J441 Chronic obstructive pulmonary disease with (acute) exacerbation: Secondary | ICD-10-CM | POA: Diagnosis not present

## 2022-10-06 DIAGNOSIS — I4891 Unspecified atrial fibrillation: Secondary | ICD-10-CM | POA: Diagnosis not present

## 2022-10-06 DIAGNOSIS — I2510 Atherosclerotic heart disease of native coronary artery without angina pectoris: Secondary | ICD-10-CM | POA: Diagnosis not present

## 2022-10-06 DIAGNOSIS — I700 Atherosclerosis of aorta: Secondary | ICD-10-CM | POA: Diagnosis not present

## 2022-10-06 DIAGNOSIS — I4892 Unspecified atrial flutter: Secondary | ICD-10-CM | POA: Diagnosis not present

## 2022-10-06 DIAGNOSIS — Z9181 History of falling: Secondary | ICD-10-CM | POA: Diagnosis not present

## 2022-10-06 DIAGNOSIS — I252 Old myocardial infarction: Secondary | ICD-10-CM | POA: Diagnosis not present

## 2022-10-06 DIAGNOSIS — Z7951 Long term (current) use of inhaled steroids: Secondary | ICD-10-CM | POA: Diagnosis not present

## 2022-10-06 DIAGNOSIS — N179 Acute kidney failure, unspecified: Secondary | ICD-10-CM | POA: Diagnosis not present

## 2022-10-06 DIAGNOSIS — E785 Hyperlipidemia, unspecified: Secondary | ICD-10-CM | POA: Diagnosis not present

## 2022-10-06 DIAGNOSIS — Z853 Personal history of malignant neoplasm of breast: Secondary | ICD-10-CM | POA: Diagnosis not present

## 2022-10-06 DIAGNOSIS — Z9012 Acquired absence of left breast and nipple: Secondary | ICD-10-CM | POA: Diagnosis not present

## 2022-10-06 DIAGNOSIS — E039 Hypothyroidism, unspecified: Secondary | ICD-10-CM | POA: Diagnosis not present

## 2022-10-06 DIAGNOSIS — I119 Hypertensive heart disease without heart failure: Secondary | ICD-10-CM | POA: Diagnosis not present

## 2022-10-06 NOTE — Progress Notes (Signed)
ANA and phosphatidylserine antibodies remain positive.  Double-stranded DNA is negative, complements are normal labs do not indicate an autoimmune disease flare.  No change in treatment advised.

## 2022-10-08 ENCOUNTER — Other Ambulatory Visit: Payer: Self-pay | Admitting: Cardiology

## 2022-10-08 DIAGNOSIS — Z7952 Long term (current) use of systemic steroids: Secondary | ICD-10-CM | POA: Diagnosis not present

## 2022-10-08 DIAGNOSIS — I119 Hypertensive heart disease without heart failure: Secondary | ICD-10-CM | POA: Diagnosis not present

## 2022-10-08 DIAGNOSIS — I700 Atherosclerosis of aorta: Secondary | ICD-10-CM | POA: Diagnosis not present

## 2022-10-08 DIAGNOSIS — I4892 Unspecified atrial flutter: Secondary | ICD-10-CM | POA: Diagnosis not present

## 2022-10-08 DIAGNOSIS — J9811 Atelectasis: Secondary | ICD-10-CM | POA: Diagnosis not present

## 2022-10-08 DIAGNOSIS — Z9181 History of falling: Secondary | ICD-10-CM | POA: Diagnosis not present

## 2022-10-08 DIAGNOSIS — Z7951 Long term (current) use of inhaled steroids: Secondary | ICD-10-CM | POA: Diagnosis not present

## 2022-10-08 DIAGNOSIS — Z7901 Long term (current) use of anticoagulants: Secondary | ICD-10-CM | POA: Diagnosis not present

## 2022-10-08 DIAGNOSIS — E785 Hyperlipidemia, unspecified: Secondary | ICD-10-CM | POA: Diagnosis not present

## 2022-10-08 DIAGNOSIS — J441 Chronic obstructive pulmonary disease with (acute) exacerbation: Secondary | ICD-10-CM | POA: Diagnosis not present

## 2022-10-08 DIAGNOSIS — I083 Combined rheumatic disorders of mitral, aortic and tricuspid valves: Secondary | ICD-10-CM | POA: Diagnosis not present

## 2022-10-08 DIAGNOSIS — Z79899 Other long term (current) drug therapy: Secondary | ICD-10-CM | POA: Diagnosis not present

## 2022-10-08 DIAGNOSIS — J440 Chronic obstructive pulmonary disease with acute lower respiratory infection: Secondary | ICD-10-CM | POA: Diagnosis not present

## 2022-10-08 DIAGNOSIS — Z9012 Acquired absence of left breast and nipple: Secondary | ICD-10-CM | POA: Diagnosis not present

## 2022-10-08 DIAGNOSIS — E039 Hypothyroidism, unspecified: Secondary | ICD-10-CM | POA: Diagnosis not present

## 2022-10-08 DIAGNOSIS — I4891 Unspecified atrial fibrillation: Secondary | ICD-10-CM | POA: Diagnosis not present

## 2022-10-08 DIAGNOSIS — I252 Old myocardial infarction: Secondary | ICD-10-CM | POA: Diagnosis not present

## 2022-10-08 DIAGNOSIS — Z853 Personal history of malignant neoplasm of breast: Secondary | ICD-10-CM | POA: Diagnosis not present

## 2022-10-08 DIAGNOSIS — J439 Emphysema, unspecified: Secondary | ICD-10-CM | POA: Diagnosis not present

## 2022-10-08 DIAGNOSIS — I2510 Atherosclerotic heart disease of native coronary artery without angina pectoris: Secondary | ICD-10-CM | POA: Diagnosis not present

## 2022-10-08 DIAGNOSIS — N179 Acute kidney failure, unspecified: Secondary | ICD-10-CM | POA: Diagnosis not present

## 2022-10-08 NOTE — Telephone Encounter (Signed)
Eliquis 5mg  refill request received. Patient is 82 years old (will be 82 yrs old in November), weight-64.9kg, Crea-1.54 on 09/13/22, Diagnosis-Aflutter, and last seen by Dr. Wyline Mood on 05/20/22. Dose is appropriate based on dosing criteria. Will send in refill to requested pharmacy.

## 2022-10-11 DIAGNOSIS — I4891 Unspecified atrial fibrillation: Secondary | ICD-10-CM | POA: Diagnosis not present

## 2022-10-11 DIAGNOSIS — Z7901 Long term (current) use of anticoagulants: Secondary | ICD-10-CM | POA: Diagnosis not present

## 2022-10-11 DIAGNOSIS — J441 Chronic obstructive pulmonary disease with (acute) exacerbation: Secondary | ICD-10-CM | POA: Diagnosis not present

## 2022-10-11 DIAGNOSIS — I083 Combined rheumatic disorders of mitral, aortic and tricuspid valves: Secondary | ICD-10-CM | POA: Diagnosis not present

## 2022-10-11 DIAGNOSIS — J439 Emphysema, unspecified: Secondary | ICD-10-CM | POA: Diagnosis not present

## 2022-10-11 DIAGNOSIS — Z9181 History of falling: Secondary | ICD-10-CM | POA: Diagnosis not present

## 2022-10-11 DIAGNOSIS — Z7952 Long term (current) use of systemic steroids: Secondary | ICD-10-CM | POA: Diagnosis not present

## 2022-10-11 DIAGNOSIS — I252 Old myocardial infarction: Secondary | ICD-10-CM | POA: Diagnosis not present

## 2022-10-11 DIAGNOSIS — I4892 Unspecified atrial flutter: Secondary | ICD-10-CM | POA: Diagnosis not present

## 2022-10-11 DIAGNOSIS — I2510 Atherosclerotic heart disease of native coronary artery without angina pectoris: Secondary | ICD-10-CM | POA: Diagnosis not present

## 2022-10-11 DIAGNOSIS — E039 Hypothyroidism, unspecified: Secondary | ICD-10-CM | POA: Diagnosis not present

## 2022-10-11 DIAGNOSIS — J440 Chronic obstructive pulmonary disease with acute lower respiratory infection: Secondary | ICD-10-CM | POA: Diagnosis not present

## 2022-10-11 DIAGNOSIS — I700 Atherosclerosis of aorta: Secondary | ICD-10-CM | POA: Diagnosis not present

## 2022-10-11 DIAGNOSIS — Z853 Personal history of malignant neoplasm of breast: Secondary | ICD-10-CM | POA: Diagnosis not present

## 2022-10-11 DIAGNOSIS — S30810A Abrasion of lower back and pelvis, initial encounter: Secondary | ICD-10-CM | POA: Insufficient documentation

## 2022-10-11 DIAGNOSIS — Z9012 Acquired absence of left breast and nipple: Secondary | ICD-10-CM | POA: Diagnosis not present

## 2022-10-11 DIAGNOSIS — I119 Hypertensive heart disease without heart failure: Secondary | ICD-10-CM | POA: Diagnosis not present

## 2022-10-11 DIAGNOSIS — N179 Acute kidney failure, unspecified: Secondary | ICD-10-CM | POA: Diagnosis not present

## 2022-10-11 DIAGNOSIS — Z79899 Other long term (current) drug therapy: Secondary | ICD-10-CM | POA: Diagnosis not present

## 2022-10-11 DIAGNOSIS — J9811 Atelectasis: Secondary | ICD-10-CM | POA: Diagnosis not present

## 2022-10-11 DIAGNOSIS — Z7951 Long term (current) use of inhaled steroids: Secondary | ICD-10-CM | POA: Diagnosis not present

## 2022-10-11 DIAGNOSIS — E785 Hyperlipidemia, unspecified: Secondary | ICD-10-CM | POA: Diagnosis not present

## 2022-10-12 DIAGNOSIS — S51812D Laceration without foreign body of left forearm, subsequent encounter: Secondary | ICD-10-CM | POA: Diagnosis not present

## 2022-10-12 DIAGNOSIS — S30810D Abrasion of lower back and pelvis, subsequent encounter: Secondary | ICD-10-CM | POA: Diagnosis not present

## 2022-10-12 DIAGNOSIS — W19XXXD Unspecified fall, subsequent encounter: Secondary | ICD-10-CM | POA: Diagnosis not present

## 2022-10-12 DIAGNOSIS — Z713 Dietary counseling and surveillance: Secondary | ICD-10-CM | POA: Diagnosis not present

## 2022-10-12 DIAGNOSIS — Z87891 Personal history of nicotine dependence: Secondary | ICD-10-CM | POA: Diagnosis not present

## 2022-10-12 DIAGNOSIS — I10 Essential (primary) hypertension: Secondary | ICD-10-CM | POA: Diagnosis not present

## 2022-10-12 DIAGNOSIS — Z6821 Body mass index (BMI) 21.0-21.9, adult: Secondary | ICD-10-CM | POA: Diagnosis not present

## 2022-10-12 DIAGNOSIS — T148XXD Other injury of unspecified body region, subsequent encounter: Secondary | ICD-10-CM | POA: Diagnosis not present

## 2022-10-13 DIAGNOSIS — I2510 Atherosclerotic heart disease of native coronary artery without angina pectoris: Secondary | ICD-10-CM | POA: Diagnosis not present

## 2022-10-13 DIAGNOSIS — Z9012 Acquired absence of left breast and nipple: Secondary | ICD-10-CM | POA: Diagnosis not present

## 2022-10-13 DIAGNOSIS — J440 Chronic obstructive pulmonary disease with acute lower respiratory infection: Secondary | ICD-10-CM | POA: Diagnosis not present

## 2022-10-13 DIAGNOSIS — N179 Acute kidney failure, unspecified: Secondary | ICD-10-CM | POA: Diagnosis not present

## 2022-10-13 DIAGNOSIS — E785 Hyperlipidemia, unspecified: Secondary | ICD-10-CM | POA: Diagnosis not present

## 2022-10-13 DIAGNOSIS — E039 Hypothyroidism, unspecified: Secondary | ICD-10-CM | POA: Diagnosis not present

## 2022-10-13 DIAGNOSIS — Z79899 Other long term (current) drug therapy: Secondary | ICD-10-CM | POA: Diagnosis not present

## 2022-10-13 DIAGNOSIS — I083 Combined rheumatic disorders of mitral, aortic and tricuspid valves: Secondary | ICD-10-CM | POA: Diagnosis not present

## 2022-10-13 DIAGNOSIS — I119 Hypertensive heart disease without heart failure: Secondary | ICD-10-CM | POA: Diagnosis not present

## 2022-10-13 DIAGNOSIS — Z9181 History of falling: Secondary | ICD-10-CM | POA: Diagnosis not present

## 2022-10-13 DIAGNOSIS — Z7952 Long term (current) use of systemic steroids: Secondary | ICD-10-CM | POA: Diagnosis not present

## 2022-10-13 DIAGNOSIS — Z7951 Long term (current) use of inhaled steroids: Secondary | ICD-10-CM | POA: Diagnosis not present

## 2022-10-13 DIAGNOSIS — Z853 Personal history of malignant neoplasm of breast: Secondary | ICD-10-CM | POA: Diagnosis not present

## 2022-10-13 DIAGNOSIS — J439 Emphysema, unspecified: Secondary | ICD-10-CM | POA: Diagnosis not present

## 2022-10-13 DIAGNOSIS — Z7901 Long term (current) use of anticoagulants: Secondary | ICD-10-CM | POA: Diagnosis not present

## 2022-10-13 DIAGNOSIS — I252 Old myocardial infarction: Secondary | ICD-10-CM | POA: Diagnosis not present

## 2022-10-13 DIAGNOSIS — I700 Atherosclerosis of aorta: Secondary | ICD-10-CM | POA: Diagnosis not present

## 2022-10-13 DIAGNOSIS — I4891 Unspecified atrial fibrillation: Secondary | ICD-10-CM | POA: Diagnosis not present

## 2022-10-13 DIAGNOSIS — J441 Chronic obstructive pulmonary disease with (acute) exacerbation: Secondary | ICD-10-CM | POA: Diagnosis not present

## 2022-10-13 DIAGNOSIS — J9811 Atelectasis: Secondary | ICD-10-CM | POA: Diagnosis not present

## 2022-10-13 DIAGNOSIS — I4892 Unspecified atrial flutter: Secondary | ICD-10-CM | POA: Diagnosis not present

## 2022-10-14 DIAGNOSIS — H353231 Exudative age-related macular degeneration, bilateral, with active choroidal neovascularization: Secondary | ICD-10-CM | POA: Diagnosis not present

## 2022-10-14 DIAGNOSIS — H35341 Macular cyst, hole, or pseudohole, right eye: Secondary | ICD-10-CM | POA: Diagnosis not present

## 2022-10-14 DIAGNOSIS — H35051 Retinal neovascularization, unspecified, right eye: Secondary | ICD-10-CM | POA: Diagnosis not present

## 2022-10-14 DIAGNOSIS — H3561 Retinal hemorrhage, right eye: Secondary | ICD-10-CM | POA: Diagnosis not present

## 2022-10-14 DIAGNOSIS — H33001 Unspecified retinal detachment with retinal break, right eye: Secondary | ICD-10-CM | POA: Diagnosis not present

## 2022-10-20 ENCOUNTER — Other Ambulatory Visit: Payer: Self-pay

## 2022-10-27 DIAGNOSIS — R7301 Impaired fasting glucose: Secondary | ICD-10-CM | POA: Diagnosis not present

## 2022-10-27 DIAGNOSIS — R0609 Other forms of dyspnea: Secondary | ICD-10-CM | POA: Diagnosis not present

## 2022-10-27 DIAGNOSIS — E039 Hypothyroidism, unspecified: Secondary | ICD-10-CM | POA: Diagnosis not present

## 2022-10-27 DIAGNOSIS — R410 Disorientation, unspecified: Secondary | ICD-10-CM | POA: Diagnosis not present

## 2022-10-27 DIAGNOSIS — E871 Hypo-osmolality and hyponatremia: Secondary | ICD-10-CM | POA: Diagnosis not present

## 2022-10-27 DIAGNOSIS — I10 Essential (primary) hypertension: Secondary | ICD-10-CM | POA: Diagnosis not present

## 2022-10-27 DIAGNOSIS — E782 Mixed hyperlipidemia: Secondary | ICD-10-CM | POA: Diagnosis not present

## 2022-10-27 DIAGNOSIS — M810 Age-related osteoporosis without current pathological fracture: Secondary | ICD-10-CM | POA: Diagnosis not present

## 2022-11-01 DIAGNOSIS — K219 Gastro-esophageal reflux disease without esophagitis: Secondary | ICD-10-CM | POA: Insufficient documentation

## 2022-11-02 DIAGNOSIS — R7301 Impaired fasting glucose: Secondary | ICD-10-CM | POA: Diagnosis not present

## 2022-11-02 DIAGNOSIS — E039 Hypothyroidism, unspecified: Secondary | ICD-10-CM | POA: Diagnosis not present

## 2022-11-02 DIAGNOSIS — I129 Hypertensive chronic kidney disease with stage 1 through stage 4 chronic kidney disease, or unspecified chronic kidney disease: Secondary | ICD-10-CM | POA: Diagnosis not present

## 2022-11-02 DIAGNOSIS — I4891 Unspecified atrial fibrillation: Secondary | ICD-10-CM | POA: Diagnosis not present

## 2022-11-02 DIAGNOSIS — E782 Mixed hyperlipidemia: Secondary | ICD-10-CM | POA: Diagnosis not present

## 2022-11-02 DIAGNOSIS — J449 Chronic obstructive pulmonary disease, unspecified: Secondary | ICD-10-CM | POA: Diagnosis not present

## 2022-11-02 DIAGNOSIS — G4700 Insomnia, unspecified: Secondary | ICD-10-CM | POA: Insufficient documentation

## 2022-11-02 DIAGNOSIS — N184 Chronic kidney disease, stage 4 (severe): Secondary | ICD-10-CM | POA: Diagnosis not present

## 2022-11-02 DIAGNOSIS — I10 Essential (primary) hypertension: Secondary | ICD-10-CM | POA: Diagnosis not present

## 2022-11-02 DIAGNOSIS — R768 Other specified abnormal immunological findings in serum: Secondary | ICD-10-CM | POA: Insufficient documentation

## 2022-11-02 DIAGNOSIS — D631 Anemia in chronic kidney disease: Secondary | ICD-10-CM | POA: Diagnosis not present

## 2022-11-02 DIAGNOSIS — M545 Low back pain, unspecified: Secondary | ICD-10-CM | POA: Diagnosis not present

## 2022-11-02 DIAGNOSIS — K5900 Constipation, unspecified: Secondary | ICD-10-CM | POA: Diagnosis not present

## 2022-11-18 DIAGNOSIS — F411 Generalized anxiety disorder: Secondary | ICD-10-CM | POA: Insufficient documentation

## 2022-11-18 DIAGNOSIS — R0600 Dyspnea, unspecified: Secondary | ICD-10-CM | POA: Diagnosis not present

## 2022-11-18 DIAGNOSIS — G4700 Insomnia, unspecified: Secondary | ICD-10-CM | POA: Diagnosis not present

## 2022-11-18 DIAGNOSIS — J449 Chronic obstructive pulmonary disease, unspecified: Secondary | ICD-10-CM | POA: Diagnosis not present

## 2022-11-18 DIAGNOSIS — I10 Essential (primary) hypertension: Secondary | ICD-10-CM | POA: Diagnosis not present

## 2022-11-18 DIAGNOSIS — D649 Anemia, unspecified: Secondary | ICD-10-CM | POA: Diagnosis not present

## 2022-11-20 ENCOUNTER — Other Ambulatory Visit: Payer: Self-pay

## 2022-11-20 ENCOUNTER — Emergency Department (HOSPITAL_COMMUNITY): Payer: 59

## 2022-11-20 ENCOUNTER — Inpatient Hospital Stay (HOSPITAL_COMMUNITY)
Admission: EM | Admit: 2022-11-20 | Discharge: 2022-11-22 | DRG: 378 | Disposition: A | Discharge status: Home / Self Care | Payer: 59 | Attending: Internal Medicine | Admitting: Internal Medicine

## 2022-11-20 ENCOUNTER — Encounter (HOSPITAL_COMMUNITY): Payer: Self-pay

## 2022-11-20 DIAGNOSIS — D649 Anemia, unspecified: Secondary | ICD-10-CM | POA: Diagnosis not present

## 2022-11-20 DIAGNOSIS — I4891 Unspecified atrial fibrillation: Secondary | ICD-10-CM | POA: Diagnosis present

## 2022-11-20 DIAGNOSIS — K31811 Angiodysplasia of stomach and duodenum with bleeding: Secondary | ICD-10-CM | POA: Diagnosis present

## 2022-11-20 DIAGNOSIS — Z8249 Family history of ischemic heart disease and other diseases of the circulatory system: Secondary | ICD-10-CM | POA: Diagnosis not present

## 2022-11-20 DIAGNOSIS — Z825 Family history of asthma and other chronic lower respiratory diseases: Secondary | ICD-10-CM

## 2022-11-20 DIAGNOSIS — D500 Iron deficiency anemia secondary to blood loss (chronic): Secondary | ICD-10-CM | POA: Diagnosis not present

## 2022-11-20 DIAGNOSIS — K2289 Other specified disease of esophagus: Secondary | ICD-10-CM | POA: Diagnosis present

## 2022-11-20 DIAGNOSIS — K921 Melena: Secondary | ICD-10-CM | POA: Diagnosis not present

## 2022-11-20 DIAGNOSIS — K222 Esophageal obstruction: Secondary | ICD-10-CM | POA: Diagnosis present

## 2022-11-20 DIAGNOSIS — I4892 Unspecified atrial flutter: Secondary | ICD-10-CM | POA: Diagnosis present

## 2022-11-20 DIAGNOSIS — Z87891 Personal history of nicotine dependence: Secondary | ICD-10-CM | POA: Diagnosis not present

## 2022-11-20 DIAGNOSIS — K922 Gastrointestinal hemorrhage, unspecified: Secondary | ICD-10-CM | POA: Diagnosis present

## 2022-11-20 DIAGNOSIS — I10 Essential (primary) hypertension: Secondary | ICD-10-CM | POA: Diagnosis present

## 2022-11-20 DIAGNOSIS — I129 Hypertensive chronic kidney disease with stage 1 through stage 4 chronic kidney disease, or unspecified chronic kidney disease: Secondary | ICD-10-CM | POA: Diagnosis present

## 2022-11-20 DIAGNOSIS — D508 Other iron deficiency anemias: Secondary | ICD-10-CM | POA: Diagnosis not present

## 2022-11-20 DIAGNOSIS — I252 Old myocardial infarction: Secondary | ICD-10-CM

## 2022-11-20 DIAGNOSIS — N184 Chronic kidney disease, stage 4 (severe): Secondary | ICD-10-CM | POA: Diagnosis present

## 2022-11-20 DIAGNOSIS — N1832 Chronic kidney disease, stage 3b: Secondary | ICD-10-CM

## 2022-11-20 DIAGNOSIS — Z955 Presence of coronary angioplasty implant and graft: Secondary | ICD-10-CM | POA: Diagnosis not present

## 2022-11-20 DIAGNOSIS — J449 Chronic obstructive pulmonary disease, unspecified: Secondary | ICD-10-CM | POA: Diagnosis present

## 2022-11-20 DIAGNOSIS — N189 Chronic kidney disease, unspecified: Secondary | ICD-10-CM | POA: Diagnosis not present

## 2022-11-20 DIAGNOSIS — E039 Hypothyroidism, unspecified: Secondary | ICD-10-CM | POA: Diagnosis present

## 2022-11-20 DIAGNOSIS — D696 Thrombocytopenia, unspecified: Secondary | ICD-10-CM | POA: Diagnosis present

## 2022-11-20 DIAGNOSIS — E785 Hyperlipidemia, unspecified: Secondary | ICD-10-CM | POA: Diagnosis present

## 2022-11-20 DIAGNOSIS — R5381 Other malaise: Secondary | ICD-10-CM | POA: Diagnosis present

## 2022-11-20 DIAGNOSIS — Z7901 Long term (current) use of anticoagulants: Secondary | ICD-10-CM

## 2022-11-20 DIAGNOSIS — F418 Other specified anxiety disorders: Secondary | ICD-10-CM | POA: Diagnosis present

## 2022-11-20 DIAGNOSIS — D509 Iron deficiency anemia, unspecified: Secondary | ICD-10-CM | POA: Diagnosis present

## 2022-11-20 DIAGNOSIS — E782 Mixed hyperlipidemia: Secondary | ICD-10-CM | POA: Diagnosis present

## 2022-11-20 DIAGNOSIS — I2510 Atherosclerotic heart disease of native coronary artery without angina pectoris: Secondary | ICD-10-CM | POA: Diagnosis present

## 2022-11-20 DIAGNOSIS — Z853 Personal history of malignant neoplasm of breast: Secondary | ICD-10-CM

## 2022-11-20 DIAGNOSIS — F32A Depression, unspecified: Secondary | ICD-10-CM | POA: Diagnosis present

## 2022-11-20 DIAGNOSIS — I7300 Raynaud's syndrome without gangrene: Secondary | ICD-10-CM | POA: Diagnosis present

## 2022-11-20 DIAGNOSIS — M810 Age-related osteoporosis without current pathological fracture: Secondary | ICD-10-CM | POA: Diagnosis present

## 2022-11-20 DIAGNOSIS — K3189 Other diseases of stomach and duodenum: Secondary | ICD-10-CM | POA: Diagnosis not present

## 2022-11-20 DIAGNOSIS — K5790 Diverticulosis of intestine, part unspecified, without perforation or abscess without bleeding: Secondary | ICD-10-CM

## 2022-11-20 DIAGNOSIS — D62 Acute posthemorrhagic anemia: Secondary | ICD-10-CM

## 2022-11-20 DIAGNOSIS — Z79890 Hormone replacement therapy (postmenopausal): Secondary | ICD-10-CM

## 2022-11-20 DIAGNOSIS — K5730 Diverticulosis of large intestine without perforation or abscess without bleeding: Secondary | ICD-10-CM | POA: Diagnosis present

## 2022-11-20 DIAGNOSIS — R1314 Dysphagia, pharyngoesophageal phase: Secondary | ICD-10-CM | POA: Diagnosis present

## 2022-11-20 DIAGNOSIS — Z1152 Encounter for screening for COVID-19: Secondary | ICD-10-CM

## 2022-11-20 DIAGNOSIS — K449 Diaphragmatic hernia without obstruction or gangrene: Secondary | ICD-10-CM | POA: Diagnosis present

## 2022-11-20 DIAGNOSIS — R768 Other specified abnormal immunological findings in serum: Secondary | ICD-10-CM | POA: Insufficient documentation

## 2022-11-20 DIAGNOSIS — Z79899 Other long term (current) drug therapy: Secondary | ICD-10-CM

## 2022-11-20 DIAGNOSIS — M1990 Unspecified osteoarthritis, unspecified site: Secondary | ICD-10-CM | POA: Insufficient documentation

## 2022-11-20 DIAGNOSIS — R0602 Shortness of breath: Secondary | ICD-10-CM | POA: Diagnosis not present

## 2022-11-20 LAB — COMPREHENSIVE METABOLIC PANEL
ALT: 14 U/L (ref 0–44)
AST: 31 U/L (ref 15–41)
Albumin: 3.4 g/dL — ABNORMAL LOW (ref 3.5–5.0)
Alkaline Phosphatase: 72 U/L (ref 38–126)
Anion gap: 8 (ref 5–15)
BUN: 29 mg/dL — ABNORMAL HIGH (ref 8–23)
CO2: 16 mmol/L — ABNORMAL LOW (ref 22–32)
Calcium: 8.7 mg/dL — ABNORMAL LOW (ref 8.9–10.3)
Chloride: 109 mmol/L (ref 98–111)
Creatinine, Ser: 1.29 mg/dL — ABNORMAL HIGH (ref 0.44–1.00)
GFR, Estimated: 42 mL/min — ABNORMAL LOW (ref >60)
Glucose, Bld: 94 mg/dL (ref 70–99)
Potassium: 4.3 mmol/L (ref 3.5–5.1)
Sodium: 133 mmol/L — ABNORMAL LOW (ref 135–145)
Total Bilirubin: 0.5 mg/dL (ref 0.3–1.2)
Total Protein: 6.1 g/dL — ABNORMAL LOW (ref 6.5–8.1)

## 2022-11-20 LAB — CBC
HCT: 20.4 % — ABNORMAL LOW (ref 36.0–46.0)
Hemoglobin: 6 g/dL — CL (ref 12.0–15.0)
MCH: 28.6 pg (ref 26.0–34.0)
MCHC: 29.4 g/dL — ABNORMAL LOW (ref 30.0–36.0)
MCV: 97.1 fL (ref 80.0–100.0)
Platelets: ADEQUATE 10*3/uL (ref 150–400)
RBC: 2.1 MIL/uL — ABNORMAL LOW (ref 3.87–5.11)
RDW: 17.2 % — ABNORMAL HIGH (ref 11.5–15.5)
WBC: 4.5 10*3/uL (ref 4.0–10.5)
nRBC: 0 % (ref 0.0–0.2)

## 2022-11-20 LAB — IRON AND TIBC
Iron: 13 ug/dL — ABNORMAL LOW (ref 28–170)
Saturation Ratios: 4 % — ABNORMAL LOW (ref 10.4–31.8)
TIBC: 371 ug/dL (ref 250–450)
UIBC: 358 ug/dL

## 2022-11-20 LAB — HEMOGLOBIN AND HEMATOCRIT, BLOOD
HCT: 29.9 % — ABNORMAL LOW (ref 36.0–46.0)
Hemoglobin: 9.7 g/dL — ABNORMAL LOW (ref 12.0–15.0)

## 2022-11-20 LAB — RETICULOCYTES
Immature Retic Fract: 29.9 % — ABNORMAL HIGH (ref 2.3–15.9)
RBC.: 2.08 MIL/uL — ABNORMAL LOW (ref 3.87–5.11)
Retic Count, Absolute: 59.3 10*3/uL (ref 19.0–186.0)
Retic Ct Pct: 2.9 % (ref 0.4–3.1)

## 2022-11-20 LAB — PROTIME-INR
INR: 1.5 — ABNORMAL HIGH (ref 0.8–1.2)
Prothrombin Time: 18.2 seconds — ABNORMAL HIGH (ref 11.4–15.2)

## 2022-11-20 LAB — POC OCCULT BLOOD, ED

## 2022-11-20 LAB — FOLATE: Folate: 10.8 ng/mL (ref >5.9)

## 2022-11-20 LAB — FERRITIN: Ferritin: 25 ng/mL (ref 11–307)

## 2022-11-20 LAB — VITAMIN B12: Vitamin B-12: 554 pg/mL (ref 180–914)

## 2022-11-20 LAB — PREPARE RBC (CROSSMATCH)

## 2022-11-20 MED ORDER — PANTOPRAZOLE SODIUM 40 MG IV SOLR
40.0000 mg | Freq: Two times a day (BID) | INTRAVENOUS | Status: DC
  Administered 2022-11-21 – 2022-11-22 (×3): 40 mg via INTRAVENOUS
  Filled 2022-11-20 (×3): qty 10

## 2022-11-20 MED ORDER — UMECLIDINIUM BROMIDE 62.5 MCG/ACT IN AEPB
1.0000 | INHALATION_SPRAY | Freq: Every day | RESPIRATORY_TRACT | Status: DC
  Administered 2022-11-21 – 2022-11-22 (×2): 1 via RESPIRATORY_TRACT
  Filled 2022-11-20: qty 7

## 2022-11-20 MED ORDER — ACETAMINOPHEN 650 MG RE SUPP: 650.0000 mg | Freq: Four times a day (QID) | RECTAL | Status: DC | PRN

## 2022-11-20 MED ORDER — SODIUM CHLORIDE 0.9 % IV SOLN
250.0000 mg | Freq: Every day | INTRAVENOUS | Status: AC
  Administered 2022-11-20 – 2022-11-22 (×3): 250 mg via INTRAVENOUS
  Filled 2022-11-20 (×3): qty 20

## 2022-11-20 MED ORDER — SODIUM CHLORIDE 0.9% FLUSH
3.0000 mL | Freq: Two times a day (BID) | INTRAVENOUS | Status: DC
  Administered 2022-11-20 – 2022-11-22 (×4): 3 mL via INTRAVENOUS

## 2022-11-20 MED ORDER — BUPROPION HCL ER (XL) 150 MG PO TB24
300.0000 mg | ORAL_TABLET | Freq: Every day | ORAL | Status: DC
  Administered 2022-11-21: 300 mg via ORAL
  Filled 2022-11-20: qty 2

## 2022-11-20 MED ORDER — SERTRALINE HCL 50 MG PO TABS
25.0000 mg | ORAL_TABLET | Freq: Every day | ORAL | Status: DC
  Administered 2022-11-20 – 2022-11-21 (×2): 25 mg via ORAL
  Filled 2022-11-20 (×2): qty 1

## 2022-11-20 MED ORDER — ACETAMINOPHEN 325 MG PO TABS
650.0000 mg | ORAL_TABLET | Freq: Four times a day (QID) | ORAL | Status: DC | PRN
  Administered 2022-11-21 (×2): 650 mg via ORAL
  Filled 2022-11-20 (×2): qty 2

## 2022-11-20 MED ORDER — FLUTICASONE FUROATE-VILANTEROL 100-25 MCG/ACT IN AEPB
1.0000 | INHALATION_SPRAY | Freq: Every day | RESPIRATORY_TRACT | Status: DC
  Administered 2022-11-21 – 2022-11-22 (×2): 1 via RESPIRATORY_TRACT
  Filled 2022-11-20: qty 28

## 2022-11-20 MED ORDER — SODIUM CHLORIDE 0.9% IV SOLUTION: Freq: Once | INTRAVENOUS | Status: AC

## 2022-11-20 MED ORDER — SODIUM CHLORIDE 0.9 % IV SOLN: INTRAVENOUS | Status: DC

## 2022-11-20 MED ORDER — PANTOPRAZOLE 80MG IVPB - SIMPLE MED
80.0000 mg | Freq: Once | INTRAVENOUS | Status: AC
  Administered 2022-11-20: 80 mg via INTRAVENOUS
  Filled 2022-11-20: qty 100

## 2022-11-20 MED ORDER — COPPER GLUCONATE 2 MG PO CAPS: 6.0000 mg | ORAL_CAPSULE | Freq: Every day | ORAL | Status: DC

## 2022-11-20 MED ORDER — LEVOTHYROXINE SODIUM 50 MCG PO TABS
50.0000 ug | ORAL_TABLET | Freq: Every day | ORAL | Status: DC
  Administered 2022-11-21: 50 ug via ORAL
  Filled 2022-11-20 (×2): qty 1

## 2022-11-20 NOTE — Progress Notes (Signed)
PHARMACIST - PHYSICIAN ORDER COMMUNICATION  CONCERNING: P&T Medication Policy on Herbal Medications  DESCRIPTION:  This patient's order for:  copper gluconate has been noted.  This product(s) is classified as an "herbal" or natural product. Due to a lack of definitive safety studies or FDA approval, nonstandard manufacturing practices, plus the potential risk of unknown drug-drug interactions while on inpatient medications, the Pharmacy and Therapeutics Committee does not permit the use of "herbal" or natural products of this type within [facility].   ACTION TAKEN: The pharmacy department is unable to verify this order at this time and your patient has been informed of this safety policy. Please reevaluate patient's clinical condition at discharge and address if the herbal or natural product(s) should be resumed at that time.

## 2022-11-20 NOTE — Hospital Course (Addendum)
Patricia Jackson is a 82 yo female with PMH Gr A esophagitis (2018 EGD), positive ANA, Raynaud's, Atrial flutter (on Eliquis), IDA CAD, COPD, HTN who presented after outpatient labs revealed worsened anemia. Labs ordered by PCP she says for followup. She has been SOB recently for u/k reasons so further testing commenced. She is followed by rheumatology as well and recently seen on 10/01/2022.  She has not been paying attention to appearance of stools.  In the ER FOBT noted to be positive. Denied NSAID use nor any tobacco/etoh use.  Compliant on meds including Eliquis for aflutter.  Hemoglobin was 6 g/dL and she was ordered 2 units PRBC. She is admitted for further GI bleed workup and GI evaluation.  She underwent EGD on 11/22/2022.  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  Small angiodysplastic lesion without bleeding also noted in second portion of duodenum treated with APC.  She was continued on twice daily Protonix at discharge along with oral iron.  Eliquis was held for 2 more days after discharge.  She will follow-up outpatient with GI.

## 2022-11-20 NOTE — ED Triage Notes (Signed)
Pt reports having exertional dyspnea, saw her PCP yesterday and was called today and told to go to the ER for low hgb.  Hx of transfusion a few years ago.

## 2022-11-20 NOTE — Assessment & Plan Note (Addendum)
-   last EGD and colonoscopy in 2018: EGD noted Gr A esophagitis and colonoscopy with 2 polyps noted in hepatic flexure.  Diverticulosis involving sigmoid and descending colon - presents with dark stools, FOBT positive - not on any NSAIDs, mostly tylenol for pain; denies tobacco or etoh use - Hgb 6 g/dL - does have hx IDA, on Eliquis too (now on hold) - getting 2 units PRBC in ER; follow up and trend Hgb - GI consulted on admission also - continue protonix IV BID

## 2022-11-20 NOTE — ED Notes (Signed)
Pt ambulated across hall to bathroom. Blood continues to infuse. Pt denies any pain. Blankets provided for warmth and comfort. Pt watching TV.,

## 2022-11-20 NOTE — ED Notes (Signed)
ED TO INPATIENT HANDOFF REPORT  ED Nurse Name and Phone #: Eileen Stanford Lilygrace Rodick  S Name/Age/Gender Patricia Jackson 82 y.o. female Room/Bed: APA11/APA11  Code Status   Code Status: Full Code  Home/SNF/Other Home Patient oriented to: self, place, time, and situation Is this baseline? Yes   Triage Complete: Triage complete  Chief Complaint GIB (gastrointestinal bleeding) [K92.2]  Triage Note Pt reports having exertional dyspnea, saw her PCP yesterday and was called today and told to go to the ER for low hgb.  Hx of transfusion a few years ago.   Allergies Allergies  Allergen Reactions   Sulfa Antibiotics     Unknown reaction     Level of Care/Admitting Diagnosis ED Disposition     ED Disposition  Admit   Condition  --   Comment  Hospital Area: [facility] [[id number]]  Level of Care: Med-Surg [16]  Covid Evaluation: Asymptomatic - no recent exposure (last 10 days) testing not required  Diagnosis: GIB (gastrointestinal bleeding) [086578]  Admitting Physician: Lewie Chamber [phone]  Attending Physician: Lewie Chamber [phone]  Certification:: I certify this patient will need inpatient services for at least 2 midnights  Expected Medical Readiness: 11/24/2022          B Medical/Surgery History Past Medical History:  Diagnosis Date   A-fib [facility])    Atrial flutter (HCC)    On Eliquis in 8/17 but discontinued after stent placement   Breast cancer (HCC)    remote   CAD in native artery 03/16/2016   COPD (chronic obstructive pulmonary disease) (HCC)    Dysrhythmia    Hypertension    Iron deficiency anemia 06/12/2021   NSTEMI (non-ST elevated myocardial infarction) (HCC)    2017   S/P angioplasty with stent 03/15/16 DES Resolute, pLCX 03/16/2016   Vitreous hemorrhage of left eye (HCC) 08/01/2019   Past Surgical History:  Procedure Laterality Date   APPENDECTOMY     BREAST SURGERY Left    CARDIAC CATHETERIZATION N/A 03/15/2016   Procedure: Left Heart Cath  and Coronary Angiography;  Surgeon: Lyn Records, MD;  Location: [facility] INVASIVE CV LAB;  Service: Cardiovascular;  Laterality: N/A;   CARDIAC CATHETERIZATION N/A 03/15/2016   Procedure: Coronary Stent Intervention;  Surgeon: Lyn Records, MD;  Location: [facility] INVASIVE CV LAB;  Service: Cardiovascular;  Laterality: N/A;   COLONOSCOPY     remote   COLONOSCOPY N/A 05/26/2016   Procedure: COLONOSCOPY;  Surgeon: Corbin Ade, MD;  Location: [facility] ENDO SUITE;  Service: Endoscopy;  Laterality: N/A;  845   ESOPHAGOGASTRODUODENOSCOPY N/A 05/26/2016   Procedure: ESOPHAGOGASTRODUODENOSCOPY (EGD);  Surgeon: Corbin Ade, MD;  Location: [facility] ENDO SUITE;  Service: Endoscopy;  Laterality: N/A;   EYE SURGERY     EYE SURGERY Left    Dr. Kai Levins DILATION N/A 05/26/2016   Procedure: Elease Hashimoto DILATION;  Surgeon: Corbin Ade, MD;  Location: [facility] ENDO SUITE;  Service: Endoscopy;  Laterality: N/A;   TOOTH EXTRACTION N/A 01/15/2022   Procedure: DENTAL RESTORATION/EXTRACTIONS;  Surgeon: Ocie Doyne, DMD;  Location: [facility] OR;  Service: Oral Surgery;  Laterality: N/A;   VITRECTOMY Left 10/03/2019   Dr. Luciana Axe, Vitrectomy, Focal Laser, Removal of Silicone Oil     A IV Location/Drains/Wounds Patient Lines/Drains/Airways Status     Active Line/Drains/Airways     Name Placement date Placement time Site Days   Peripheral IV 11/20/22 Anterior;Right Hand 11/20/22  1111  Hand  less than 1   Peripheral IV 11/20/22 22 G Right Antecubital  11/20/22  1328  Antecubital  less than 1   Wound / Incision (Open or Dehisced) 11/07/19 Skin tear Nose Mid 11/07/19  1900  Nose  1109            Intake/Output Last 24 hours  Intake/Output Summary (Last 24 hours) at 11/20/2022 1418 Last data filed at 11/20/2022 1221 Gross per 24 hour  Intake 315 ml  Output --  Net 315 ml    Labs/Imaging Results for orders placed or performed during the hospital encounter of 11/20/22 (from the past 48 hour(s))  Prepare RBC (crossmatch)      Status: None   Collection Time: 11/20/22 10:47 AM  Result Value Ref Range   Order Confirmation      ORDER PROCESSED BY BLOOD BANK Performed at [facility], [address] [place], [place] [address]   POC occult blood, ED Provider will collect     Status: Abnormal (In process)   Collection Time: 11/20/22 11:00 AM  Result Value Ref Range   Fecal Occult Blood    Comprehensive metabolic panel     Status: Abnormal   Collection Time: 11/20/22 11:11 AM  Result Value Ref Range   Sodium 133 (L) 135 - 145 mmol/L   Potassium 4.3 3.5 - 5.1 mmol/L   Chloride 109 98 - 111 mmol/L   CO2 16 (L) 22 - 32 mmol/L   Glucose, Bld 94 70 - 99 mg/dL    Comment: Glucose reference range applies only to samples taken after fasting for at least 8 hours.   BUN 29 (H) 8 - 23 mg/dL   Creatinine, Ser 5.78 (H) 0.44 - 1.00 mg/dL   Calcium 8.7 (L) 8.9 - 10.3 mg/dL   Total Protein 6.1 (L) 6.5 - 8.1 g/dL   Albumin 3.4 (L) 3.5 - 5.0 g/dL   AST 31 15 - 41 U/L   ALT 14 0 - 44 U/L   Alkaline Phosphatase 72 38 - 126 U/L   Total Bilirubin 0.5 0.3 - 1.2 mg/dL   GFR, Estimated 42 (L) >60 mL/min    Comment: (NOTE) Calculated using the CKD-EPI Creatinine Equation (2021)    Anion gap 8 5 - 15    Comment: Performed at [facility], [address] [place], [place] [address]  Protime-INR     Status: Abnormal   Collection Time: 11/20/22 11:11 AM  Result Value Ref Range   Prothrombin Time 18.2 (H) 11.4 - 15.2 seconds   INR 1.5 (H) 0.8 - 1.2    Comment: (NOTE) INR goal varies based on device and disease states. Performed at [facility], [address], [place], [place] [address]   Type and screen     Status: None (Preliminary result)   Collection Time: 11/20/22 11:11 AM  Result Value Ref Range   ABO/RH(D) A POS    Antibody Screen NEG    Sample Expiration 11/23/2022,2359    Unit Number [id number]    Blood Component Type RED CELLS,LR    Unit division 00    Status of Unit ISSUED    Transfusion Status OK TO  TRANSFUSE    Crossmatch Result      Compatible Performed at [facility], [address] [place], [place] [address]    Unit Number [id number]    Blood Component Type RED CELLS,LR    Unit division 00    Status of Unit ALLOCATED    Transfusion Status OK TO TRANSFUSE    Crossmatch Result Compatible   Vitamin B12  Status: None   Collection Time: 11/20/22 12:13 PM  Result Value Ref Range   Vitamin B-12 554 180 - 914 pg/mL    Comment: (NOTE) This assay is not validated for testing neonatal or myeloproliferative syndrome specimens for Vitamin B12 levels. Performed at [facility], [address], [place], [place] [address]   Folate     Status: None   Collection Time: 11/20/22 12:13 PM  Result Value Ref Range   Folate 10.8 >5.9 ng/mL    Comment: Performed at [facility], [address], [place], [place] [address]  Iron and TIBC     Status: Abnormal   Collection Time: 11/20/22 12:13 PM  Result Value Ref Range   Iron 13 (L) 28 - 170 ug/dL   TIBC 098 119 - 147 ug/dL   Saturation Ratios 4 (L) 10.4 - 31.8 %   UIBC 358 ug/dL    Comment: Performed at [facility], [address], [place], [place] [address]  Ferritin     Status: None   Collection Time: 11/20/22 12:13 PM  Result Value Ref Range   Ferritin 25 11 - 307 ng/mL    Comment: Performed at [facility], [address], [place], [place] [address]  Reticulocytes     Status: Abnormal   Collection Time: 11/20/22 12:13 PM  Result Value Ref Range   Retic Ct Pct 2.9 0.4 - 3.1 %   RBC. 2.08 (L) 3.87 - 5.11 MIL/uL   Retic Count, Absolute 59.3 19.0 - 186.0 K/uL   Immature Retic Fract 29.9 (H) 2.3 - 15.9 %    Comment: Performed at [facility], [address], [place], [place] [address]  CBC     Status: Abnormal   Collection Time: 11/20/22 12:13 PM  Result Value Ref Range   WBC 4.5 4.0 - 10.5 K/uL   RBC 2.10 (L) 3.87 - 5.11 MIL/uL   Hemoglobin 6.0 (LL) 12.0 - 15.0 g/dL    Comment: REPEATED TO VERIFY THIS CRITICAL RESULT HAS  VERIFIED AND BEEN CALLED TO A CHESTER BY SHANA DALTON ON 08 17 2024 AT 1243, AND HAS BEEN READ BACK.     HCT 20.4 (L) 36.0 - 46.0 %   MCV 97.1 80.0 - 100.0 fL   MCH 28.6 26.0 - 34.0 pg   MCHC 29.4 (L) 30.0 - 36.0 g/dL   RDW 69.6 (H) 29.5 - 28.4 %   Platelets  150 - 400 K/uL    PLATELET CLUMPS NOTED ON SMEAR, COUNT APPEARS ADEQUATE    Comment: Immature Platelet Fraction may be clinically indicated, consider ordering this additional test [id number]    nRBC 0.0 0.0 - 0.2 %    Comment: Performed at [facility], [address] [place], [place] [address]   DG Chest Port 1 View  Result Date: 11/20/2022 CLINICAL DATA:  Shortness of breath. EXAM: PORTABLE CHEST 1 VIEW COMPARISON:  10/02/2022 FINDINGS: The cardio pericardial silhouette is enlarged. Interstitial markings are diffusely coarsened with chronic features. The lungs are clear without focal pneumonia, edema, pneumothorax or pleural effusion. Lungs are hyperexpanded. Bones are diffusely demineralized. Telemetry leads overlie the chest. IMPRESSION: Chronic interstitial coarsening without acute cardiopulmonary findings. Electronically Signed   By: Kennith Center M.D.   On: 11/20/2022 11:14    Pending Labs Unresulted Labs (From admission, onward)     Start     Ordered   11/20/22 1054  SARS Coronavirus 2 by RT PCR (hospital order, performed in [facility] hospital lab) *cepheid single result test* Anterior Nasal Swab  (  Tier 2 - SARS Coronavirus 2 by RT PCR (hospital order, performed in [facility] hospital lab) *cepheid single result test*)  Once,   URGENT        11/20/22 1053            Vitals/Pain Today's Vitals   11/20/22 1230 11/20/22 1245 11/20/22 1300 11/20/22 1315  BP: 139/71 133/66 (!) 148/72 134/63  Pulse: 65 63 68 68  Resp: 14 13  (!) 21  Temp: 98 F (36.7 C) 98 F (36.7 C)  98.1 F (36.7 C)  TempSrc: Oral     SpO2: 100% 98% 98% 97%  Weight:      Height:      PainSc:        Isolation Precautions No active  isolations  Medications Medications  0.9 %  sodium chloride infusion (Manually program via Guardrails IV Fluids) (has no administration in time range)  pantoprazole (PROTONIX) injection 40 mg (has no administration in time range)  ferric gluconate (FERRLECIT) 250 mg in sodium chloride 0.9 % 250 mL IVPB (has no administration in time range)  pantoprazole (PROTONIX) 80 mg /NS 100 mL IVPB (80 mg Intravenous New Bag/Given 11/20/22 1328)    Mobility walks     Focused Assessments    R Recommendations: See Admitting Provider Note  Report given to:   Additional Notes: Blood transfusion #1 at this time

## 2022-11-20 NOTE — ED Notes (Signed)
Pt states with any exertion gets SOB but laying in the bed she feels fine. Denies seeing any blood or dark stools but does feel like it is hard to get her stool out.

## 2022-11-20 NOTE — H&P (Signed)
History and Physical    Patricia Jackson  ZOX:[id number]  DOB: 02-01-1941  DOA: 11/20/2022  PCP: Benita Stabile, MD Patient coming from: Home  Chief Complaint: low Hgb  HPI:  Ms. Mumper is a 82 yo female with PMH Gr A esophagitis (2018 EGD), positive ANA, Raynaud's, Atrial flutter (on Eliquis), IDA CAD, COPD, HTN who presented after outpatient labs revealed worsened anemia. Labs ordered by PCP she says for followup. She has been SOB recently for u/k reasons so further testing commenced. She is followed by rheumatology as well and recently seen on 10/01/2022.  She has not been paying attention to appearance of stools.  In the ER FOBT noted to be positive. Denied NSAID use nor any tobacco/etoh use.  Compliant on meds including Eliquis for aflutter.  Hemoglobin was 6 g/dL and she was ordered 2 units PRBC. She is admitted for further GI bleed workup and GI evaluation.  I have personally briefly reviewed patient's old medical records in [facility] and discussed patient with the ER provider when appropriate/indicated.  Assessment and Plan: * GIB (gastrointestinal bleeding) - last EGD and colonoscopy in 2018: EGD noted Gr A esophagitis and colonoscopy with 2 polyps noted in hepatic flexure.  Diverticulosis involving sigmoid and descending colon - presents with dark stools, FOBT positive - not on any NSAIDs, mostly tylenol for pain; denies tobacco or etoh use - Hgb 6 g/dL - does have hx IDA, on Eliquis too (now on hold) - getting 2 units PRBC in ER; follow up and trend Hgb - GI consulted on admission also - continue protonix IV BID  Iron deficiency anemia - Iron 13, TIBC 371, sat ratio 4% - ferritin 25 - will give IV iron while hospitalized as well given severe anemia  Atrial flutter (HCC) - hold eliquis - vitals stable; monitor for now off meds  Thrombocytopenia (HCC) - baseline around 100k - clumped on CBC - repeat CBC in am  Raynaud's syndrome - follows with  rheumatology   Positive ANA (antinuclear antibody) - Follows outpatient with Dr. Corliss Skains; last seen 10/01/2022  Hypothyroidism - Continue Synthroid  Depression - Continue sertraline  COPD (chronic obstructive pulmonary disease) (HCC) - no s/s exac; former smoker - continue home inhaler  CKD (chronic kidney disease), stage IV (HCC) - patient has history of CKD4. Baseline creat ~ 1.4 - 1.6, eGFR~ 28   CAD in native artery - s/p DES stent12/11/17, pLCX   Hyperlipidemia - hold statin  Hypertension - meds on hold for now in setting of GIB workup  Code Status:     Code Status: Full Code  DVT Prophylaxis: SCD     Anticipated disposition is to: Home  History: Past Medical History:  Diagnosis Date   A-fib [facility])    Atrial flutter (HCC)    On Eliquis in 8/17 but discontinued after stent placement   Breast cancer (HCC)    remote   CAD in native artery 03/16/2016   COPD (chronic obstructive pulmonary disease) (HCC)    Dysrhythmia    Hypertension    Iron deficiency anemia 06/12/2021   NSTEMI (non-ST elevated myocardial infarction) (HCC)    2017   S/P angioplasty with stent 03/15/16 DES Resolute, pLCX 03/16/2016   Vitreous hemorrhage of left eye (HCC) 08/01/2019    Past Surgical History:  Procedure Laterality Date   APPENDECTOMY     BREAST SURGERY Left    CARDIAC CATHETERIZATION N/A 03/15/2016   Procedure: Left Heart Cath and Coronary Angiography;  Surgeon: Barry Dienes  Katrinka Blazing, MD;  Location: [facility] INVASIVE CV LAB;  Service: Cardiovascular;  Laterality: N/A;   CARDIAC CATHETERIZATION N/A 03/15/2016   Procedure: Coronary Stent Intervention;  Surgeon: Lyn Records, MD;  Location: [facility] INVASIVE CV LAB;  Service: Cardiovascular;  Laterality: N/A;   COLONOSCOPY     remote   COLONOSCOPY N/A 05/26/2016   Procedure: COLONOSCOPY;  Surgeon: Corbin Ade, MD;  Location: [facility] ENDO SUITE;  Service: Endoscopy;  Laterality: N/A;  845   ESOPHAGOGASTRODUODENOSCOPY N/A 05/26/2016   Procedure:  ESOPHAGOGASTRODUODENOSCOPY (EGD);  Surgeon: Corbin Ade, MD;  Location: [facility] ENDO SUITE;  Service: Endoscopy;  Laterality: N/A;   EYE SURGERY     EYE SURGERY Left    Dr. Kai Levins DILATION N/A 05/26/2016   Procedure: Elease Hashimoto DILATION;  Surgeon: Corbin Ade, MD;  Location: [facility] ENDO SUITE;  Service: Endoscopy;  Laterality: N/A;   TOOTH EXTRACTION N/A 01/15/2022   Procedure: DENTAL RESTORATION/EXTRACTIONS;  Surgeon: Ocie Doyne, DMD;  Location: [facility] OR;  Service: Oral Surgery;  Laterality: N/A;   VITRECTOMY Left 10/03/2019   Dr. Luciana Axe, Vitrectomy, Focal Laser, Removal of Silicone Oil     reports that she quit smoking about 6 years ago. Her smoking use included cigarettes. She has been exposed to tobacco smoke. She has never used smokeless tobacco. She reports that she does not drink alcohol and does not use drugs.  Allergies  Allergen Reactions   Sulfa Antibiotics     Unknown reaction     Family History  Problem Relation Age of Onset   Heart disease Mother    COPD Father    Cancer Sister        unknown primary   Cancer Brother        unknown primary   Cancer Brother    Hypertension Son    Colon cancer Neg Hx     Home Medications: Prior to Admission medications   Medication Sig Start Date End Date Taking? Authorizing Provider  acetaminophen (TYLENOL) 500 MG tablet Take by mouth as needed. 03/21/17   [provider]  albuterol (PROVENTIL) (2.5 MG/3ML) 0.083% nebulizer solution Take 2.5 mg by nebulization every 6 (six) hours as needed for wheezing or shortness of breath.    [provider]  albuterol (VENTOLIN HFA) 108 (90 Base) MCG/ACT inhaler Inhale 1-2 puffs into the lungs every 6 (six) hours as needed for wheezing or shortness of breath. 03/02/22   Particia Nearing, PA-C  amLODipine-olmesartan (AZOR) 10-40 MG tablet Take 1 tablet by mouth daily.    [provider]  apixaban (ELIQUIS) 5 MG TABS tablet TAKE ONE TABLET (5MG  TOTAL) BY  MOUTH TWOTIMES DAILY 10/08/22   Antoine Poche, MD  buPROPion (WELLBUTRIN XL) 300 MG 24 hr tablet Take 300 mg by mouth daily.    [provider]  Cholecalciferol (VITAMIN D3) 50 MCG (2000 UT) TABS Take 2,000 Units by mouth daily.    [provider]  Copper Gluconate (COPPER CAPS PO) Take 6 mg by mouth daily.    [provider]  denosumab (PROLIA) 60 MG/ML SOSY injection Inject 60 mg into the skin every 6 (six) months.    [provider]  diclofenac Sodium (VOLTAREN) 1 % GEL APPLY 2 GRAMS TO THE AFFECTED AREA(S) BY TOPICAL ROUTE 4 TIMES PER DAY 06/02/22   [provider]  fluticasone (FLONASE) 50 MCG/ACT nasal spray Place 2 sprays into both nostrils daily. 06/23/21   Rodolph Bong, MD  guaiFENesin (MUCINEX) 600 MG  12 hr tablet Take 1 tablet (600 mg total) by mouth 2 (two) times daily as needed. 07/29/22   Kendell Bane, MD  levothyroxine (SYNTHROID) 50 MCG tablet Take 50 mcg by mouth daily before breakfast.  09/20/19   [provider]  metoprolol succinate (TOPROL XL) 25 MG 24 hr tablet Take 1 tablet (25 mg total) by mouth daily. 09/02/21   Quintella Reichert, MD  nitroGLYCERIN (NITROSTAT) 0.4 MG SL tablet Place 0.4 mg under the tongue every 5 (five) minutes as needed for chest pain. 06/09/19   [provider]  pantoprazole (PROTONIX) 40 MG tablet Take 1 tablet (40 mg total) by mouth daily. 09/14/22 10/14/22  Vassie Loll, MD  predniSONE (DELTASONE) 20 MG tablet Take 3 tabs by Mouth daily x 1 day; then 2 tablets by mouth daily x 2 days; then 1 tablet by mouth daily x 3 days; then half tablet by mouth daily x 3 days and stop prednisone. Patient not taking: Reported on 10/01/2022 09/14/22   Vassie Loll, MD  rosuvastatin (CRESTOR) 20 MG tablet Take 20 mg by mouth daily.  02/26/17   [provider]  sertraline (ZOLOFT) 25 MG tablet Take 25 mg by mouth at bedtime.    [provider]  TRELEGY ELLIPTA 100-62.5-25 MCG/ACT AEPB  Inhale 1 puff into the lungs daily. 06/13/21   [provider]    Review of Systems:  Review of Systems  Constitutional:  Positive for malaise/fatigue.  HENT: Negative.    Eyes: Negative.   Respiratory:  Positive for shortness of breath.   Cardiovascular: Negative.   Gastrointestinal: Negative.   Genitourinary: Negative.   Musculoskeletal: Negative.   Skin: Negative.   Neurological: Negative.   Endo/Heme/Allergies: Negative.   Psychiatric/Behavioral: Negative.      Physical Exam:  Vitals:   11/20/22 1230 11/20/22 1245 11/20/22 1300 11/20/22 1315  BP: 139/71 133/66 (!) 148/72 134/63  Pulse: 65 63 68 68  Resp: 14 13  (!) 21  Temp: 98 F (36.7 C) 98 F (36.7 C)  98.1 F (36.7 C)  TempSrc: Oral     SpO2: 100% 98% 98% 97%  Weight:      Height:       Physical Exam Constitutional:      General: She is not in acute distress.    Appearance: Normal appearance. She is not ill-appearing.  HENT:     Head: Normocephalic and atraumatic.     Mouth/Throat:     Mouth: Mucous membranes are moist.  Eyes:     Extraocular Movements: Extraocular movements intact.  Cardiovascular:     Rate and Rhythm: Normal rate and regular rhythm.  Pulmonary:     Effort: Pulmonary effort is normal. No respiratory distress.     Breath sounds: Normal breath sounds. No wheezing.  Abdominal:     General: Bowel sounds are normal. There is no distension.     Palpations: Abdomen is soft.     Tenderness: There is no abdominal tenderness.  Musculoskeletal:        General: No swelling. Normal range of motion.     Cervical back: Normal range of motion and neck supple.  Skin:    General: Skin is warm and dry.  Neurological:     General: No focal deficit present.     Mental Status: She is alert and oriented to person, place, and time.  Psychiatric:        Mood and Affect: Mood normal.  Behavior: Behavior normal.      Labs on Admission:  I have personally reviewed following labs and  imaging studies Results for orders placed or performed during the hospital encounter of 11/20/22 (from the past 24 hour(s))  Prepare RBC (crossmatch)     Status: None   Collection Time: 11/20/22 10:47 AM  Result Value Ref Range   Order Confirmation      ORDER PROCESSED BY BLOOD BANK Performed at [facility], [address] [place], [place] [address]   POC occult blood, ED Provider will collect     Status: Abnormal (In process)   Collection Time: 11/20/22 11:00 AM  Result Value Ref Range   Fecal Occult Blood    Comprehensive metabolic panel     Status: Abnormal   Collection Time: 11/20/22 11:11 AM  Result Value Ref Range   Sodium 133 (L) 135 - 145 mmol/L   Potassium 4.3 3.5 - 5.1 mmol/L   Chloride 109 98 - 111 mmol/L   CO2 16 (L) 22 - 32 mmol/L   Glucose, Bld 94 70 - 99 mg/dL   BUN 29 (H) 8 - 23 mg/dL   Creatinine, Ser 6.04 (H) 0.44 - 1.00 mg/dL   Calcium 8.7 (L) 8.9 - 10.3 mg/dL   Total Protein 6.1 (L) 6.5 - 8.1 g/dL   Albumin 3.4 (L) 3.5 - 5.0 g/dL   AST 31 15 - 41 U/L   ALT 14 0 - 44 U/L   Alkaline Phosphatase 72 38 - 126 U/L   Total Bilirubin 0.5 0.3 - 1.2 mg/dL   GFR, Estimated 42 (L) >60 mL/min   Anion gap 8 5 - 15  Protime-INR     Status: Abnormal   Collection Time: 11/20/22 11:11 AM  Result Value Ref Range   Prothrombin Time 18.2 (H) 11.4 - 15.2 seconds   INR 1.5 (H) 0.8 - 1.2  Type and screen     Status: None (Preliminary result)   Collection Time: 11/20/22 11:11 AM  Result Value Ref Range   ABO/RH(D) A POS    Antibody Screen NEG    Sample Expiration 11/23/2022,2359    Unit Number [id number]    Blood Component Type RED CELLS,LR    Unit division 00    Status of Unit ISSUED    Transfusion Status OK TO TRANSFUSE    Crossmatch Result      Compatible Performed at [facility], [address], [place], [place] [address]    Unit Number [id number]    Blood Component Type RED CELLS,LR    Unit division 00    Status of Unit ALLOCATED    Transfusion Status OK  TO TRANSFUSE    Crossmatch Result Compatible   Vitamin B12     Status: None   Collection Time: 11/20/22 12:13 PM  Result Value Ref Range   Vitamin B-12 554 180 - 914 pg/mL  Folate     Status: None   Collection Time: 11/20/22 12:13 PM  Result Value Ref Range   Folate 10.8 >5.9 ng/mL  Iron and TIBC     Status: Abnormal   Collection Time: 11/20/22 12:13 PM  Result Value Ref Range   Iron 13 (L) 28 - 170 ug/dL   TIBC 528 413 - 244 ug/dL   Saturation Ratios 4 (L) 10.4 - 31.8 %   UIBC 358 ug/dL  Ferritin     Status: None   Collection Time: 11/20/22 12:13 PM  Result Value Ref Range   Ferritin 25 11 - 307  ng/mL  Reticulocytes     Status: Abnormal   Collection Time: 11/20/22 12:13 PM  Result Value Ref Range   Retic Ct Pct 2.9 0.4 - 3.1 %   RBC. 2.08 (L) 3.87 - 5.11 MIL/uL   Retic Count, Absolute 59.3 19.0 - 186.0 K/uL   Immature Retic Fract 29.9 (H) 2.3 - 15.9 %  CBC     Status: Abnormal   Collection Time: 11/20/22 12:13 PM  Result Value Ref Range   WBC 4.5 4.0 - 10.5 K/uL   RBC 2.10 (L) 3.87 - 5.11 MIL/uL   Hemoglobin 6.0 (LL) 12.0 - 15.0 g/dL   HCT 69.6 (L) 29.5 - 28.4 %   MCV 97.1 80.0 - 100.0 fL   MCH 28.6 26.0 - 34.0 pg   MCHC 29.4 (L) 30.0 - 36.0 g/dL   RDW 13.2 (H) 44.0 - 10.2 %   Platelets  150 - 400 K/uL    PLATELET CLUMPS NOTED ON SMEAR, COUNT APPEARS ADEQUATE   nRBC 0.0 0.0 - 0.2 %     Radiological Exams on Admission: DG Chest Port 1 View  Result Date: 11/20/2022 CLINICAL DATA:  Shortness of breath. EXAM: PORTABLE CHEST 1 VIEW COMPARISON:  10/02/2022 FINDINGS: The cardio pericardial silhouette is enlarged. Interstitial markings are diffusely coarsened with chronic features. The lungs are clear without focal pneumonia, edema, pneumothorax or pleural effusion. Lungs are hyperexpanded. Bones are diffusely demineralized. Telemetry leads overlie the chest. IMPRESSION: Chronic interstitial coarsening without acute cardiopulmonary findings. Electronically Signed   By: Kennith Center M.D.   On: 11/20/2022 11:14   DG Chest Port 1 View  Final Result      Consults called:  GI   EKG: Independently reviewed. NSR   Lewie Chamber, MD [facility] 11/20/2022, 1:58 PM

## 2022-11-20 NOTE — Assessment & Plan Note (Signed)
-   baseline around 100k - clumped on CBC - repeat CBC in am

## 2022-11-20 NOTE — Assessment & Plan Note (Signed)
Continue Synthroid °

## 2022-11-20 NOTE — Assessment & Plan Note (Signed)
-   Follows outpatient with Dr. Corliss Skains; last seen 10/01/2022

## 2022-11-20 NOTE — Assessment & Plan Note (Signed)
-   hold statin

## 2022-11-20 NOTE — Assessment & Plan Note (Signed)
-   Home regimen resumed at discharge

## 2022-11-20 NOTE — Assessment & Plan Note (Addendum)
-   Iron 13, TIBC 371, sat ratio 4% - ferritin 25 -Giving Ferrlecit 250 mg x 3 doses -Oral iron continued at discharge

## 2022-11-20 NOTE — Assessment & Plan Note (Signed)
-   s/p DES stent12/11/17, pLCX

## 2022-11-20 NOTE — Assessment & Plan Note (Signed)
-   patient has history of CKD4. Baseline creat ~ 1.4 - 1.6, eGFR~ 28

## 2022-11-20 NOTE — Assessment & Plan Note (Signed)
-   hold eliquis; held for 2 more days at discharge then okay to resume - vitals stable; monitor for now off meds

## 2022-11-20 NOTE — Assessment & Plan Note (Signed)
follows with rheumatology

## 2022-11-20 NOTE — ED Notes (Signed)
Hemoglobin 6.0.

## 2022-11-20 NOTE — ED Provider Notes (Addendum)
[place] EMERGENCY DEPARTMENT AT [facility] Provider Note   CSN: [id number] Arrival date & time: 11/20/22  1014     History  Chief Complaint  Patient presents with   Abnormal Lab    Patricia Jackson is a 82 y.o. female.  Pt with c/o sob in past couple weeks. Pcp recently did outpatient labs, and called patient and told hemoglobin low, 6, and to go to ER.  Pt denies change in stools or black stools. Denies abd pain or nvd. No faintness/syncope. No chest pain or discomfort. +DOE. No fever or increased cough. Is on eliquis. Remote hx egd/colonoscopy 2018, w mild esophagitis, and w diverticula of colon. No wt loss.   The history is provided by the patient and medical records.  Abnormal Lab      Home Medications Prior to Admission medications   Medication Sig Start Date End Date Taking? Authorizing Provider  acetaminophen (TYLENOL) 500 MG tablet Take by mouth as needed. 03/21/17   [provider]  albuterol (PROVENTIL) (2.5 MG/3ML) 0.083% nebulizer solution Take 2.5 mg by nebulization every 6 (six) hours as needed for wheezing or shortness of breath.    [provider]  albuterol (VENTOLIN HFA) 108 (90 Base) MCG/ACT inhaler Inhale 1-2 puffs into the lungs every 6 (six) hours as needed for wheezing or shortness of breath. 03/02/22   Particia Nearing, PA-C  amLODipine-olmesartan (AZOR) 10-40 MG tablet Take 1 tablet by mouth daily.    [provider]  apixaban (ELIQUIS) 5 MG TABS tablet TAKE ONE TABLET (5MG  TOTAL) BY MOUTH TWOTIMES DAILY 10/08/22   Antoine Poche, MD  buPROPion (WELLBUTRIN XL) 300 MG 24 hr tablet Take 300 mg by mouth daily.    [provider]  Cholecalciferol (VITAMIN D3) 50 MCG (2000 UT) TABS Take 2,000 Units by mouth daily.    [provider]  Copper Gluconate (COPPER CAPS PO) Take 6 mg by mouth daily.    [provider]  denosumab (PROLIA) 60 MG/ML SOSY injection Inject 60 mg into the skin every  6 (six) months.    [provider]  diclofenac Sodium (VOLTAREN) 1 % GEL APPLY 2 GRAMS TO THE AFFECTED AREA(S) BY TOPICAL ROUTE 4 TIMES PER DAY 06/02/22   [provider]  fluticasone (FLONASE) 50 MCG/ACT nasal spray Place 2 sprays into both nostrils daily. 06/23/21   Rodolph Bong, MD  guaiFENesin (MUCINEX) 600 MG 12 hr tablet Take 1 tablet (600 mg total) by mouth 2 (two) times daily as needed. 07/29/22   Kendell Bane, MD  levothyroxine (SYNTHROID) 50 MCG tablet Take 50 mcg by mouth daily before breakfast.  09/20/19   [provider]  metoprolol succinate (TOPROL XL) 25 MG 24 hr tablet Take 1 tablet (25 mg total) by mouth daily. 09/02/21   Quintella Reichert, MD  nitroGLYCERIN (NITROSTAT) 0.4 MG SL tablet Place 0.4 mg under the tongue every 5 (five) minutes as needed for chest pain. 06/09/19   [provider]  pantoprazole (PROTONIX) 40 MG tablet Take 1 tablet (40 mg total) by mouth daily. 09/14/22 10/14/22  Vassie Loll, MD  predniSONE (DELTASONE) 20 MG tablet Take 3 tabs by Mouth daily x 1 day; then 2 tablets by mouth daily x 2 days; then 1 tablet by mouth daily x 3 days; then half tablet by mouth daily x 3 days and stop prednisone. Patient not taking: Reported on 10/01/2022 09/14/22   Vassie Loll, MD  rosuvastatin (CRESTOR) 20 MG tablet Take  20 mg by mouth daily.  02/26/17   [provider]  sertraline (ZOLOFT) 25 MG tablet Take 25 mg by mouth at bedtime.    [provider]  TRELEGY ELLIPTA 100-62.5-25 MCG/ACT AEPB Inhale 1 puff into the lungs daily. 06/13/21   [provider]      Allergies    Sulfa antibiotics    Review of Systems   Review of Systems  Constitutional:  Positive for fatigue. Negative for chills and fever.  HENT:  Negative for sore throat.   Eyes:  Negative for redness.  Respiratory:  Positive for shortness of breath. Negative for cough.   Cardiovascular:  Negative for chest pain and leg swelling.   Gastrointestinal:  Negative for abdominal pain, blood in stool, diarrhea and vomiting.  Genitourinary:  Negative for dysuria and flank pain.  Musculoskeletal:  Negative for back pain and neck pain.  Skin:  Negative for rash.  Neurological:  Negative for headaches.  Psychiatric/Behavioral:  Negative for confusion.     Physical Exam Updated Vital Signs BP (!) 148/72   Pulse 68   Temp 98 F (36.7 C) (Oral)   Resp 13   Ht 1.702 m (5\' 7" )   Wt 64.9 kg   SpO2 98%   BMI 22.41 kg/m  Physical Exam Vitals and nursing note reviewed.  Constitutional:      Appearance: She is well-developed.     Comments: Pale appearing.   HENT:     Head: Atraumatic.     Nose: Nose normal.     Mouth/Throat:     Mouth: Mucous membranes are moist.  Eyes:     General: No scleral icterus.    Comments: Conj pale  Neck:     Trachea: No tracheal deviation.  Cardiovascular:     Rate and Rhythm: Normal rate and regular rhythm.     Pulses: Normal pulses.     Heart sounds: Normal heart sounds. No murmur heard.    No friction rub. No gallop.  Pulmonary:     Effort: Pulmonary effort is normal. No respiratory distress.     Breath sounds: Normal breath sounds.  Abdominal:     General: Bowel sounds are normal. There is no distension.     Palpations: Abdomen is soft.     Tenderness: There is no abdominal tenderness.  Genitourinary:    Comments: No cva tenderness. Chaperoned rectal exam w RN present, v dark brown stool, heme positive.  Musculoskeletal:        General: No swelling or tenderness.     Cervical back: Normal range of motion and neck supple. No rigidity. No muscular tenderness.  Skin:    General: Skin is warm and dry.     Coloration: Skin is pale.     Findings: No rash.  Neurological:     Mental Status: She is alert.     Comments: Alert, speech normal.   Psychiatric:        Mood and Affect: Mood normal.     ED Results / Procedures / Treatments   Labs (all labs ordered are listed, but  only abnormal results are displayed) Results for orders placed or performed during the hospital encounter of 11/20/22  Comprehensive metabolic panel  Result Value Ref Range   Sodium 133 (L) 135 - 145 mmol/L   Potassium 4.3 3.5 - 5.1 mmol/L   Chloride 109 98 - 111 mmol/L   CO2 16 (L) 22 - 32 mmol/L   Glucose, Bld 94 70 - 99 mg/dL  BUN 29 (H) 8 - 23 mg/dL   Creatinine, Ser 0.98 (H) 0.44 - 1.00 mg/dL   Calcium 8.7 (L) 8.9 - 10.3 mg/dL   Total Protein 6.1 (L) 6.5 - 8.1 g/dL   Albumin 3.4 (L) 3.5 - 5.0 g/dL   AST 31 15 - 41 U/L   ALT 14 0 - 44 U/L   Alkaline Phosphatase 72 38 - 126 U/L   Total Bilirubin 0.5 0.3 - 1.2 mg/dL   GFR, Estimated 42 (L) >60 mL/min   Anion gap 8 5 - 15  Protime-INR  Result Value Ref Range   Prothrombin Time 18.2 (H) 11.4 - 15.2 seconds   INR 1.5 (H) 0.8 - 1.2  Vitamin B12  Result Value Ref Range   Vitamin B-12 554 180 - 914 pg/mL  Folate  Result Value Ref Range   Folate 10.8 >5.9 ng/mL  Iron and TIBC  Result Value Ref Range   Iron 13 (L) 28 - 170 ug/dL   TIBC 119 147 - 829 ug/dL   Saturation Ratios 4 (L) 10.4 - 31.8 %   UIBC 358 ug/dL  Ferritin  Result Value Ref Range   Ferritin 25 11 - 307 ng/mL  Reticulocytes  Result Value Ref Range   Retic Ct Pct 2.9 0.4 - 3.1 %   RBC. 2.08 (L) 3.87 - 5.11 MIL/uL   Retic Count, Absolute 59.3 19.0 - 186.0 K/uL   Immature Retic Fract 29.9 (H) 2.3 - 15.9 %  CBC  Result Value Ref Range   WBC 4.5 4.0 - 10.5 K/uL   RBC 2.10 (L) 3.87 - 5.11 MIL/uL   Hemoglobin 6.0 (LL) 12.0 - 15.0 g/dL   HCT 56.2 (L) 13.0 - 86.5 %   MCV 97.1 80.0 - 100.0 fL   MCH 28.6 26.0 - 34.0 pg   MCHC 29.4 (L) 30.0 - 36.0 g/dL   RDW 78.4 (H) 69.6 - 29.5 %   Platelets  150 - 400 K/uL    PLATELET CLUMPS NOTED ON SMEAR, COUNT APPEARS ADEQUATE   nRBC 0.0 0.0 - 0.2 %  POC occult blood, ED Provider will collect  Result Value Ref Range   Fecal Occult Blood    Type and screen  Result Value Ref Range   ABO/RH(D) A POS    Antibody Screen  NEG    Sample Expiration 11/23/2022,2359    Unit Number [id number]    Blood Component Type RED CELLS,LR    Unit division 00    Status of Unit ISSUED    Transfusion Status OK TO TRANSFUSE    Crossmatch Result      Compatible Performed at [facility], [address], [place], [place] [address]    Unit Number [id number]    Blood Component Type RED CELLS,LR    Unit division 00    Status of Unit ALLOCATED    Transfusion Status OK TO TRANSFUSE    Crossmatch Result Compatible   Prepare RBC (crossmatch)  Result Value Ref Range   Order Confirmation      ORDER PROCESSED BY BLOOD BANK Performed at [facility], [address] [place], [place] [address]   BPAM [facility]  Result Value Ref Range   ISSUE DATE / TIME [id number]    Blood Product Unit Number [id number]    PRODUCT CODE F5732K02    Unit Type and Rh 6200    Blood Product Expiration Date 542706237628    Blood Product Unit Number [id number]    PRODUCT CODE G6269S85    Unit  Type and Rh 6200    Blood Product Expiration Date [id number]       EKG EKG Interpretation Date/Time:  Saturday November 20 2022 10:58:20 EDT Ventricular Rate:  65 PR Interval:  179 QRS Duration:  90 QT Interval:  449 QTC Calculation: 467 R Axis:   70  Text Interpretation: Sinus rhythm Confirmed by Cathren Laine ([id number]) on 11/20/2022 11:24:35 AM  Radiology DG Chest Port 1 View  Result Date: 11/20/2022 CLINICAL DATA:  Shortness of breath. EXAM: PORTABLE CHEST 1 VIEW COMPARISON:  10/02/2022 FINDINGS: The cardio pericardial silhouette is enlarged. Interstitial markings are diffusely coarsened with chronic features. The lungs are clear without focal pneumonia, edema, pneumothorax or pleural effusion. Lungs are hyperexpanded. Bones are diffusely demineralized. Telemetry leads overlie the chest. IMPRESSION: Chronic interstitial coarsening without acute cardiopulmonary findings. Electronically Signed   By: Kennith Center M.D.   On: 11/20/2022 11:14     Procedures Procedures    Medications Ordered in ED Medications  0.9 %  sodium chloride infusion (Manually program via Guardrails IV Fluids) (has no administration in time range)  pantoprazole (PROTONIX) 80 mg /NS 100 mL IVPB (has no administration in time range)    ED Course/ Medical Decision Making/ A&P                                 Medical Decision Making Problems Addressed: Acute GI bleeding: acute illness or injury with systemic symptoms that poses a threat to life or bodily functions Diverticulosis: chronic illness or injury On anticoagulant therapy: chronic illness or injury that poses a threat to life or bodily functions Stage 3b chronic kidney disease (HCC): chronic illness or injury with exacerbation, progression, or side effects of treatment that poses a threat to life or bodily functions Symptomatic anemia: acute illness or injury with systemic symptoms that poses a threat to life or bodily functions  Amount and/or Complexity of Data Reviewed External Data Reviewed: labs and notes. Labs: ordered. Decision-making details documented in ED Course. Radiology: ordered and independent interpretation performed. Decision-making details documented in ED Course. ECG/medicine tests: ordered and independent interpretation performed. Decision-making details documented in ED Course. Discussion of management or test interpretation with external provider(s): Medicine, gi.   Risk Prescription drug management. Decision regarding hospitalization.   Iv ns. Continuous pulse ox and cardiac monitoring. Labs ordered/sent. Imaging ordered.   Differential diagnosis includes gi bleeding, severe anemia, etc. Dispo decision including potential need for admission considered - will get labs and imaging and reassess.   Reviewed nursing notes and prior charts for additional history. External reports reviewed.   Cardiac monitor: sinus rhythm, rate 80.  Protonix iv. Type and cross, transfuse  prbcs.   Labs reviewed/interpreted by me - hgb v low, 6. Transfuse 2 units prbc.   Xrays reviewed/interpreted by me - no pna.   Hospitalists consulted for admission. Discussed pt, will admit.  Gastroenterology also consulted.   CRITICAL CARE RE: severe symptomatic anemia, requiring emergency transfusion.  Performed by: Suzi Roots Total critical care time: 45 minutes Critical care time was exclusive of separately billable procedures and treating other patients. Critical care was necessary to treat or prevent imminent or life-threatening deterioration. Critical care was time spent personally by me on the following activities: development of treatment plan with patient and/or surrogate as well as nursing, discussions with consultants, evaluation of patient's response to treatment, examination of patient, obtaining history from patient or surrogate, ordering and performing  treatments and interventions, ordering and review of laboratory studies, ordering and review of radiographic studies, pulse oximetry and re-evaluation of patient's condition.         Final Clinical Impression(s) / ED Diagnoses Final diagnoses:  Symptomatic anemia  Acute GI bleeding  Diverticulosis  On anticoagulant therapy  Stage 3b chronic kidney disease (HCC)    Rx / DC Orders ED Discharge Orders     None           Cathren Laine, MD 11/20/22 1306

## 2022-11-20 NOTE — Assessment & Plan Note (Signed)
-   no s/s exac; former smoker - continue home inhaler

## 2022-11-20 NOTE — Assessment & Plan Note (Signed)
Continue sertraline 

## 2022-11-21 DIAGNOSIS — I4892 Unspecified atrial flutter: Secondary | ICD-10-CM | POA: Diagnosis not present

## 2022-11-21 DIAGNOSIS — D508 Other iron deficiency anemias: Secondary | ICD-10-CM

## 2022-11-21 DIAGNOSIS — Z7901 Long term (current) use of anticoagulants: Secondary | ICD-10-CM

## 2022-11-21 DIAGNOSIS — K921 Melena: Secondary | ICD-10-CM | POA: Diagnosis not present

## 2022-11-21 DIAGNOSIS — D62 Acute posthemorrhagic anemia: Secondary | ICD-10-CM

## 2022-11-21 LAB — TYPE AND SCREEN
ABO/RH(D): A POS
Antibody Screen: NEGATIVE
Unit division: 0
Unit division: 0

## 2022-11-21 LAB — BASIC METABOLIC PANEL
Anion gap: 8 (ref 5–15)
BUN: 19 mg/dL (ref 8–23)
CO2: 15 mmol/L — ABNORMAL LOW (ref 22–32)
Calcium: 8.2 mg/dL — ABNORMAL LOW (ref 8.9–10.3)
Chloride: 110 mmol/L (ref 98–111)
Creatinine, Ser: 1.12 mg/dL — ABNORMAL HIGH (ref 0.44–1.00)
GFR, Estimated: 50 mL/min — ABNORMAL LOW (ref >60)
Glucose, Bld: 86 mg/dL (ref 70–99)
Potassium: 3.4 mmol/L — ABNORMAL LOW (ref 3.5–5.1)
Sodium: 133 mmol/L — ABNORMAL LOW (ref 135–145)

## 2022-11-21 LAB — CBC WITH DIFFERENTIAL/PLATELET
Abs Immature Granulocytes: 0.05 10*3/uL (ref 0.00–0.07)
Basophils Absolute: 0 10*3/uL (ref 0.0–0.1)
Basophils Relative: 0 %
Eosinophils Absolute: 0 10*3/uL (ref 0.0–0.5)
Eosinophils Relative: 0 %
HCT: 29.2 % — ABNORMAL LOW (ref 36.0–46.0)
Hemoglobin: 9.3 g/dL — ABNORMAL LOW (ref 12.0–15.0)
Immature Granulocytes: 1 %
Lymphocytes Relative: 9 %
Lymphs Abs: 0.5 10*3/uL — ABNORMAL LOW (ref 0.7–4.0)
MCH: 28.7 pg (ref 26.0–34.0)
MCHC: 31.8 g/dL (ref 30.0–36.0)
MCV: 90.1 fL (ref 80.0–100.0)
Monocytes Absolute: 0.6 10*3/uL (ref 0.1–1.0)
Monocytes Relative: 10 %
Neutro Abs: 4.6 10*3/uL (ref 1.7–7.7)
Neutrophils Relative %: 80 %
Platelets: ADEQUATE 10*3/uL (ref 150–400)
RBC: 3.24 MIL/uL — ABNORMAL LOW (ref 3.87–5.11)
RDW: 16.2 % — ABNORMAL HIGH (ref 11.5–15.5)
WBC: 5.8 10*3/uL (ref 4.0–10.5)
nRBC: 0 % (ref 0.0–0.2)

## 2022-11-21 LAB — BPAM RBC
Blood Product Expiration Date: 202409172359
Blood Product Expiration Date: 202409172359
ISSUE DATE / TIME: 202408171215
ISSUE DATE / TIME: 202408171451
Unit Type and Rh: 6200
Unit Type and Rh: 6200

## 2022-11-21 LAB — MAGNESIUM: Magnesium: 1.8 mg/dL (ref 1.7–2.4)

## 2022-11-21 MED ORDER — BUPROPION HCL ER (XL) 150 MG PO TB24
150.0000 mg | ORAL_TABLET | Freq: Every day | ORAL | Status: DC
  Administered 2022-11-22: 150 mg via ORAL
  Filled 2022-11-21: qty 1

## 2022-11-21 MED ORDER — MAGNESIUM SULFATE 2 GM/50ML IV SOLN
2.0000 g | Freq: Once | INTRAVENOUS | Status: AC
  Administered 2022-11-21: 2 g via INTRAVENOUS
  Filled 2022-11-21: qty 50

## 2022-11-21 MED ORDER — HYDROXYZINE HCL 25 MG PO TABS
25.0000 mg | ORAL_TABLET | Freq: Three times a day (TID) | ORAL | Status: DC | PRN
  Administered 2022-11-21: 25 mg via ORAL
  Filled 2022-11-21: qty 1

## 2022-11-21 MED ORDER — POTASSIUM CHLORIDE CRYS ER 20 MEQ PO TBCR
40.0000 meq | EXTENDED_RELEASE_TABLET | Freq: Once | ORAL | Status: AC
  Administered 2022-11-21: 40 meq via ORAL
  Filled 2022-11-21: qty 2

## 2022-11-21 NOTE — Progress Notes (Signed)
Progress Note    Patricia Jackson   VQQ:[id number]  DOB: 07-26-1940  DOA: 11/20/2022     1 PCP: Benita Stabile, MD  Initial CC: low Hgb  Hospital Course: Ms. Ihde is a 82 yo female with PMH Gr A esophagitis (2018 EGD), positive ANA, Raynaud's, Atrial flutter (on Eliquis), IDA CAD, COPD, HTN who presented after outpatient labs revealed worsened anemia. Labs ordered by PCP she says for followup. She has been SOB recently for u/k reasons so further testing commenced. She is followed by rheumatology as well and recently seen on 10/01/2022.  She has not been paying attention to appearance of stools.  In the ER FOBT noted to be positive. Denied NSAID use nor any tobacco/etoh use.  Compliant on meds including Eliquis for aflutter.  Hemoglobin was 6 g/dL and she was ordered 2 units PRBC. She is admitted for further GI bleed workup and GI evaluation.  Interval History:  No events overnight.  Still states that she feels a sticking sensation in her throat when swallowing sometimes. Understands tentative plan for EGD tomorrow.  Assessment and Plan: * GIB (gastrointestinal bleeding) - last EGD and colonoscopy in 2018: EGD noted Gr A esophagitis and colonoscopy with 2 polyps noted in hepatic flexure.  Diverticulosis involving sigmoid and descending colon - presents with dark stools, FOBT positive - not on any NSAIDs, mostly tylenol for pain; denies tobacco or etoh use - Hgb 6 g/dL on admission; received 2 units PRBC - does have hx IDA, on Eliquis too (now on hold) - GI consulted on admission also; tentative plan for EGD on 8/19 - continue protonix IV BID  Iron deficiency anemia - Iron 13, TIBC 371, sat ratio 4% - ferritin 25 -Giving Ferrlecit 250 mg x 3 doses  Atrial flutter (HCC) - hold eliquis - vitals stable; monitor for now off meds  Thrombocytopenia (HCC) - baseline around 100k - clumped on CBC - repeat CBC in am  Raynaud's syndrome - follows with rheumatology   Positive  ANA (antinuclear antibody) - Follows outpatient with Dr. Corliss Skains; last seen 10/01/2022  Hypothyroidism - Continue Synthroid  Depression - Continue sertraline  COPD (chronic obstructive pulmonary disease) (HCC) - no s/s exac; former smoker - continue home inhaler  CKD (chronic kidney disease), stage IV (HCC) - patient has history of CKD4. Baseline creat ~ 1.4 - 1.6, eGFR~ 28   CAD in native artery - s/p DES stent12/11/17, pLCX   Hyperlipidemia - hold statin  Hypertension - meds on hold for now in setting of GIB workup   Old records reviewed in assessment of this patient  Antimicrobials:   DVT prophylaxis:  SCDs Start: 11/20/22 1426   Code Status:   Code Status: Full Code  Mobility Assessment (Last 72 Hours)     Mobility Assessment     Row Name 11/21/22 0900 11/20/22 2025 11/20/22 1520       Does patient have an order for bedrest or is patient medically unstable No - Continue assessment No - Continue assessment No - Continue assessment     What is the highest level of mobility based on the progressive mobility assessment? Level 5 (Walks with assist in room/hall) - Balance while stepping forward/back and can walk in room with assist - Complete Level 5 (Walks with assist in room/hall) - Balance while stepping forward/back and can walk in room with assist - Complete Level 5 (Walks with assist in room/hall) - Balance while stepping forward/back and can walk in room with assist - Complete  Barriers to discharge: None Disposition Plan: Home Status is: Inpatient  Objective: Blood pressure 126/61, pulse 78, temperature (!) 97.5 F (36.4 C), temperature source Oral, resp. rate (!) 28, height 5\' 7"  (1.702 m), weight 64.9 kg, SpO2 95%.  Examination:  Physical Exam Constitutional:      General: She is not in acute distress.    Appearance: Normal appearance. She is not ill-appearing.  HENT:     Head: Normocephalic and atraumatic.     Mouth/Throat:      Mouth: Mucous membranes are moist.  Eyes:     Extraocular Movements: Extraocular movements intact.  Cardiovascular:     Rate and Rhythm: Normal rate and regular rhythm.  Pulmonary:     Effort: Pulmonary effort is normal. No respiratory distress.     Breath sounds: Normal breath sounds. No wheezing.  Abdominal:     General: Bowel sounds are normal. There is no distension.     Palpations: Abdomen is soft.     Tenderness: There is no abdominal tenderness.  Musculoskeletal:        General: No swelling. Normal range of motion.     Cervical back: Normal range of motion and neck supple.  Skin:    General: Skin is warm and dry.  Neurological:     General: No focal deficit present.     Mental Status: She is alert and oriented to person, place, and time.  Psychiatric:        Mood and Affect: Mood normal.        Behavior: Behavior normal.      Consultants:  GI  Procedures:  11/22/2022: Tentative EGD  Data Reviewed: Results for orders placed or performed during the hospital encounter of 11/20/22 (from the past 24 hour(s))  Hemoglobin and hematocrit, blood     Status: Abnormal   Collection Time: 11/20/22  9:46 PM  Result Value Ref Range   Hemoglobin 9.7 (L) 12.0 - 15.0 g/dL   HCT 16.1 (L) 09.6 - 04.5 %  Basic metabolic panel     Status: Abnormal   Collection Time: 11/21/22  3:31 AM  Result Value Ref Range   Sodium 133 (L) 135 - 145 mmol/L   Potassium 3.4 (L) 3.5 - 5.1 mmol/L   Chloride 110 98 - 111 mmol/L   CO2 15 (L) 22 - 32 mmol/L   Glucose, Bld 86 70 - 99 mg/dL   BUN 19 8 - 23 mg/dL   Creatinine, Ser 4.09 (H) 0.44 - 1.00 mg/dL   Calcium 8.2 (L) 8.9 - 10.3 mg/dL   GFR, Estimated 50 (L) >60 mL/min   Anion gap 8 5 - 15  CBC with Differential/Platelet     Status: Abnormal   Collection Time: 11/21/22  3:31 AM  Result Value Ref Range   WBC 5.8 4.0 - 10.5 K/uL   RBC 3.24 (L) 3.87 - 5.11 MIL/uL   Hemoglobin 9.3 (L) 12.0 - 15.0 g/dL   HCT 81.1 (L) 91.4 - 78.2 %   MCV 90.1 80.0  - 100.0 fL   MCH 28.7 26.0 - 34.0 pg   MCHC 31.8 30.0 - 36.0 g/dL   RDW 95.6 (H) 21.3 - 08.6 %   Platelets  150 - 400 K/uL    PLATELET CLUMPS NOTED ON SMEAR, COUNT APPEARS ADEQUATE   nRBC 0.0 0.0 - 0.2 %   Neutrophils Relative % 80 %   Neutro Abs 4.6 1.7 - 7.7 K/uL   Lymphocytes Relative 9 %   Lymphs Abs 0.5 (L)  0.7 - 4.0 K/uL   Monocytes Relative 10 %   Monocytes Absolute 0.6 0.1 - 1.0 K/uL   Eosinophils Relative 0 %   Eosinophils Absolute 0.0 0.0 - 0.5 K/uL   Basophils Relative 0 %   Basophils Absolute 0.0 0.0 - 0.1 K/uL   WBC Morphology MORPHOLOGY UNREMARKABLE    RBC Morphology MORPHOLOGY UNREMARKABLE    Smear Review PLATELET COUNT CONFIRMED BY SMEAR    Immature Granulocytes 1 %   Abs Immature Granulocytes 0.05 0.00 - 0.07 K/uL  Magnesium     Status: None   Collection Time: 11/21/22  3:31 AM  Result Value Ref Range   Magnesium 1.8 1.7 - 2.4 mg/dL    I have reviewed pertinent nursing notes, vitals, labs, and images as necessary. I have ordered labwork to follow up on as indicated.  I have reviewed the last notes from staff over past 24 hours. I have discussed patient's care plan and test results with nursing staff, CM/SW, and other staff as appropriate.  Time spent: Greater than 50% of the 55 minute visit was spent in counseling/coordination of care for the patient as laid out in the A&P.   LOS: 1 day   Lewie Chamber, MD [facility] 11/21/2022, 2:06 PM

## 2022-11-21 NOTE — H&P (View-Only) (Signed)
Consulting  Provider: Dr. Frederick Peers Primary Care Physician:  Benita Stabile, MD Primary Gastroenterologist:  Dr. Jena Gauss  Reason for Consultation: Melena, acute blood loss anemia  HPI:  Patricia Jackson is a 82 y.o. female with a past medical history of GERD, atrial flutter chronically on Eliquis, iron deficiency anemia, CAD, COPD, hypertension, who presented to [facility], ER due to worsening outpatient labs of anemia.  Does note some exertional dyspnea.  In the ER found to have hemoglobin 6.0.  Improved to 9.3 today after 2 units PRBCs.  Hemoccult positive.  Patient states she has been noticing dark stools as of late.  No frank blood/hematochezia.  No abdominal pain.  No chronic NSAID use.  Does not chronically take PPIs at home.  Compliant with Eliquis last dose yesterday.  Does note some esophageal dysphagia, feels like food just sits in her substernal region at times.  This causes belching.  Started on IV Protonix twice daily.  EGD 05/26/2016, LA grade a esophagitis, hiatal hernia.  Colonoscopy 05/26/2016 sigmoid/descending diverticulosis, few small tubular adenomas removed.  Past Medical History:  Diagnosis Date   A-fib [facility])    Atrial flutter (HCC)    On Eliquis in 8/17 but discontinued after stent placement   Breast cancer (HCC)    remote   CAD in native artery 03/16/2016   COPD (chronic obstructive pulmonary disease) (HCC)    Dysrhythmia    Hypertension    Iron deficiency anemia 06/12/2021   NSTEMI (non-ST elevated myocardial infarction) (HCC)    2017   S/P angioplasty with stent 03/15/16 DES Resolute, pLCX 03/16/2016   Vitreous hemorrhage of left eye (HCC) 08/01/2019    Past Surgical History:  Procedure Laterality Date   APPENDECTOMY     BREAST SURGERY Left    CARDIAC CATHETERIZATION N/A 03/15/2016   Procedure: Left Heart Cath and Coronary Angiography;  Surgeon: Lyn Records, MD;  Location: [facility] INVASIVE CV LAB;  Service: Cardiovascular;  Laterality: N/A;   CARDIAC  CATHETERIZATION N/A 03/15/2016   Procedure: Coronary Stent Intervention;  Surgeon: Lyn Records, MD;  Location: [facility] INVASIVE CV LAB;  Service: Cardiovascular;  Laterality: N/A;   COLONOSCOPY     remote   COLONOSCOPY N/A 05/26/2016   Procedure: COLONOSCOPY;  Surgeon: Corbin Ade, MD;  Location: [facility] ENDO SUITE;  Service: Endoscopy;  Laterality: N/A;  845   ESOPHAGOGASTRODUODENOSCOPY N/A 05/26/2016   Procedure: ESOPHAGOGASTRODUODENOSCOPY (EGD);  Surgeon: Corbin Ade, MD;  Location: [facility] ENDO SUITE;  Service: Endoscopy;  Laterality: N/A;   EYE SURGERY     EYE SURGERY Left    Dr. Kai Levins DILATION N/A 05/26/2016   Procedure: Elease Hashimoto DILATION;  Surgeon: Corbin Ade, MD;  Location: [facility] ENDO SUITE;  Service: Endoscopy;  Laterality: N/A;   TOOTH EXTRACTION N/A 01/15/2022   Procedure: DENTAL RESTORATION/EXTRACTIONS;  Surgeon: Ocie Doyne, DMD;  Location: [facility] OR;  Service: Oral Surgery;  Laterality: N/A;   VITRECTOMY Left 10/03/2019   Dr. Luciana Axe, Vitrectomy, Focal Laser, Removal of Silicone Oil    Prior to Admission medications   Medication Sig Start Date End Date Taking? Authorizing Provider  acetaminophen (TYLENOL) 500 MG tablet Take 1,000 mg by mouth every 8 (eight) hours as needed for mild pain or moderate pain. 03/21/17  Yes [provider]  albuterol (PROVENTIL) (2.5 MG/3ML) 0.083% nebulizer solution Take 2.5 mg by nebulization every 6 (six) hours as needed for wheezing or shortness of breath.   Yes [provider]  amLODipine-olmesartan (AZOR) 10-40 MG  tablet Take 1 tablet by mouth daily.   Yes [provider]  apixaban (ELIQUIS) 5 MG TABS tablet TAKE ONE TABLET (5MG  TOTAL) BY MOUTH TWOTIMES DAILY Patient taking differently: Take 5 mg by mouth 2 (two) times daily. 10/08/22  Yes Antoine Poche, MD  buPROPion (WELLBUTRIN XL) 150 MG 24 hr tablet Take 150 mg by mouth daily. 11/18/22  Yes [provider]  Cholecalciferol (VITAMIN D3) 50 MCG (2000  UT) TABS Take 2,000 Units by mouth daily.   Yes [provider]  Copper Gluconate (COPPER CAPS PO) Take 6 mg by mouth daily.   Yes [provider]  denosumab (PROLIA) 60 MG/ML SOSY injection Inject 60 mg into the skin every 6 (six) months.   Yes [provider]  levothyroxine (SYNTHROID) 50 MCG tablet Take 50 mcg by mouth daily before breakfast.  09/20/19  Yes [provider]  metoprolol succinate (TOPROL XL) 25 MG 24 hr tablet Take 1 tablet (25 mg total) by mouth daily. 09/02/21  Yes Turner, Cornelious Bryant, MD  rosuvastatin (CRESTOR) 20 MG tablet Take 20 mg by mouth daily.  02/26/17  Yes [provider]  sertraline (ZOLOFT) 25 MG tablet Take 25 mg by mouth at bedtime.   Yes [provider]  [name] 200-62.5-25 MCG/ACT AEPB Inhale 1 puff into the lungs daily. 11/02/22  Yes [provider]  nitroGLYCERIN (NITROSTAT) 0.4 MG SL tablet Place 0.4 mg under the tongue every 5 (five) minutes as needed for chest pain. 06/09/19   [provider]    Current Facility-Administered Medications  Medication Dose Route Frequency Provider Last Rate Last Admin   0.9 %  sodium chloride infusion   Intravenous Continuous Lewie Chamber, MD 75 mL/hr at 11/21/22 0809 New Bag at 11/21/22 0809   acetaminophen (TYLENOL) tablet 650 mg  650 mg Oral Q6H PRN Lewie Chamber, MD   650 mg at 11/21/22 1610   Or   acetaminophen (TYLENOL) suppository 650 mg  650 mg Rectal Q6H PRN Lewie Chamber, MD       buPROPion (WELLBUTRIN XL) 24 hr tablet 300 mg  300 mg Oral Daily Lewie Chamber, MD   300 mg at 11/21/22 9604   ferric gluconate (FERRLECIT) 250 mg in sodium chloride 0.9 % 250 mL IVPB  250 mg Intravenous Daily Lewie Chamber, MD 135 mL/hr at 11/21/22 0932 250 mg at 11/21/22 0932   fluticasone furoate-vilanterol (BREO ELLIPTA) 100-25 MCG/ACT 1 puff  1 puff Inhalation Daily Lewie Chamber, MD   1 puff at 11/21/22 5409   And   umeclidinium bromide (INCRUSE ELLIPTA)  62.5 MCG/ACT 1 puff  1 puff Inhalation Daily Lewie Chamber, MD   1 puff at 11/21/22 0858   levothyroxine (SYNTHROID) tablet 50 mcg  50 mcg Oral Q0600 Lewie Chamber, MD   50 mcg at 11/21/22 0549   pantoprazole (PROTONIX) injection 40 mg  40 mg Intravenous Orlene Erm, MD   40 mg at 11/21/22 8119   sertraline (ZOLOFT) tablet 25 mg  25 mg Oral Axel Filler, MD   25 mg at 11/20/22 2256   sodium chloride flush (NS) 0.9 % injection 3 mL  3 mL Intravenous Orlene Erm, MD   3 mL at 11/21/22 0809    Allergies as of 11/20/2022 - Review Complete 11/20/2022  Allergen Reaction Noted   Sulfa antibiotics  08/02/2013    Family History  Problem Relation Age of Onset   Heart disease Mother    COPD Father  Cancer Sister        unknown primary   Cancer Brother        unknown primary   Cancer Brother    Hypertension Son    Colon cancer Neg Hx     Social History   Socioeconomic History   Marital status: Divorced    Spouse name: Not on file   Number of children: 1   Years of education: Not on file   Highest education level: Not on file  Occupational History   Occupation: Retired  Tobacco Use   Smoking status: Former    Current packs/day: 0.00    Types: Cigarettes    Quit date: 12/28/2015    Years since quitting: 6.9    Passive exposure: Past   Smokeless tobacco: Never   Tobacco comments:    Patient quit within the last 10 years  Vaping Use   Vaping status: Never Used  Substance and Sexual Activity   Alcohol use: Never   Drug use: Never   Sexual activity: Not Currently  Other Topics Concern   Not on file  Social History Narrative   ** Merged History Encounter **       Social Determinants of Health   Financial Resource Strain: Not on file  Food Insecurity: No Food Insecurity (09/12/2022)   Hunger Vital Sign    Worried About Running Out of Food in the Last Year: Never true    Ran Out of Food in the Last Year: Never true  Transportation Needs: No  Transportation Needs (09/12/2022)   PRAPARE - Administrator, Civil Service (Medical): No    Lack of Transportation (Non-Medical): No  Physical Activity: Not on file  Stress: Not on file  Social Connections: Not on file  Intimate Partner Violence: Not At Risk (09/12/2022)   Humiliation, Afraid, Rape, and Kick questionnaire    Fear of Current or Ex-Partner: No    Emotionally Abused: No    Physically Abused: No    Sexually Abused: No    Review of Systems: General: Negative for anorexia, weight loss, fever, chills, fatigue, weakness. Eyes: Negative for vision changes.  ENT: Negative for hoarseness, difficulty swallowing , nasal congestion. CV: Negative for chest pain, angina, palpitations, dyspnea on exertion, peripheral edema.  Respiratory: Negative for dyspnea at rest, positive for dyspnea on exertion, negative  cough, sputum, wheezing.  GI: See history of present illness. GU:  Negative for dysuria, hematuria, urinary incontinence, urinary frequency, nocturnal urination.  MS: Negative for joint pain, low back pain.  Derm: Negative for rash or itching.  Neuro: Negative for weakness, abnormal sensation, seizure, frequent headaches, memory loss, confusion.  Psych: Negative for anxiety, depression Endo: Negative for unusual weight change.  Heme: Negative for bruising or bleeding. Allergy: Negative for rash or hives.  Physical Exam: Vital signs in last 24 hours: Temp:  [97.1 F (36.2 C)-98.5 F (36.9 C)] 97.8 F (36.6 C) (08/18 0304) Pulse Rate:  [58-93] 81 (08/18 0304) Resp:  [13-21] 18 (08/18 0304) BP: (117-164)/(63-83) 117/80 (08/18 0304) SpO2:  [93 %-100 %] 94 % (08/18 0858) Last BM Date : 11/18/22 General:   Alert,  Well-developed, well-nourished, pleasant and cooperative in NAD Head:  Normocephalic and atraumatic. Eyes:  Sclera clear, no icterus.   Conjunctiva pink. Ears:  Normal auditory acuity. Nose:  No deformity, discharge,  or lesions. Mouth:  No deformity  or lesions, dentition normal. Neck:  Supple; no masses or thyromegaly. Lungs:  Clear throughout to auscultation.   No  wheezes, crackles, or rhonchi. No acute distress. Heart:  Regular rate and rhythm; no murmurs, clicks, rubs,  or gallops. Abdomen:  Soft, nontender and nondistended. No masses, hepatosplenomegaly or hernias noted. Normal bowel sounds, without guarding, and without rebound.   Msk:  Symmetrical without gross deformities. Normal posture. Pulses:  Normal pulses noted. Extremities:  Without clubbing or edema. Neurologic:  Alert and  oriented x4;  grossly normal neurologically. Skin:  Intact without significant lesions or rashes. Cervical Nodes:  No significant cervical adenopathy. Psych:  Alert and cooperative. Normal mood and affect.  Intake/Output from previous day: 08/17 0701 - 08/18 0700 In: 1383 [P.O.:360; Blood:1023] Out: 250 [Urine:250] Intake/Output this shift: Total I/O In: -  Out: 900 [Urine:900]  Lab Results: Recent Labs    11/20/22 1213 11/20/22 2146 11/21/22 0331  WBC 4.5  --  5.8  HGB 6.0* 9.7* 9.3*  HCT 20.4* 29.9* 29.2*  PLT PLATELET CLUMPS NOTED ON SMEAR, COUNT APPEARS ADEQUATE  --  PLATELET CLUMPS NOTED ON SMEAR, COUNT APPEARS ADEQUATE   BMET Recent Labs    11/20/22 1111 11/21/22 0331  NA 133* 133*  K 4.3 3.4*  CL 109 110  CO2 16* 15*  GLUCOSE 94 86  BUN 29* 19  CREATININE 1.29* 1.12*  CALCIUM 8.7* 8.2*   LFT Recent Labs    11/20/22 1111  PROT 6.1*  ALBUMIN 3.4*  AST 31  ALT 14  ALKPHOS 72  BILITOT 0.5   PT/INR Recent Labs    11/20/22 1111  LABPROT 18.2*  INR 1.5*   Hepatitis Panel No results for input(s): "HEPBSAG", "HCVAB", "HEPAIGM", "HEPBIGM" in the last 72 hours. C-Diff No results for input(s): "CDIFFTOX" in the last 72 hours.  Studies/Results: DG Chest Port 1 View  Result Date: 11/20/2022 CLINICAL DATA:  Shortness of breath. EXAM: PORTABLE CHEST 1 VIEW COMPARISON:  10/02/2022 FINDINGS: The cardio pericardial  silhouette is enlarged. Interstitial markings are diffusely coarsened with chronic features. The lungs are clear without focal pneumonia, edema, pneumothorax or pleural effusion. Lungs are hyperexpanded. Bones are diffusely demineralized. Telemetry leads overlie the chest. IMPRESSION: Chronic interstitial coarsening without acute cardiopulmonary findings. Electronically Signed   By: Kennith Center M.D.   On: 11/20/2022 11:14    Assessment: *Melena *Acute blood loss anemia *Systemic anticoagulation with Eliquis *Dysphagia  Plan: Etiology of patient's melena and acute blood loss anemia unclear.  Will plan on EGD with possible dilation tomorrow after 48 hours of Eliquis washout to evaluate for peptic ulcer disease, esophagitis, gastritis, H. Pylori, duodenitis, or other. Will also evaluate for esophageal stricture, Schatzki's ring, esophageal web or other.   The risks including infection, bleed, or perforation as well as benefits, limitations, alternatives and imponderables have been reviewed with the patient. Potential for esophageal dilation, biopsy, etc. have also been reviewed.  Questions have been answered. All parties agreeable.  Continue IV PPI twice daily.  Continue to monitor H&H and transfuse for less than 7.  Full liquid diet today, n.p.o. after midnight.  Thank you for the consultation  Hennie Duos. Marletta Lor, D.O. [facility]    LOS: 1 day     11/21/2022, 11:03 AM

## 2022-11-21 NOTE — Progress Notes (Signed)
Consulting  Provider: Dr. Frederick Peers Primary Care Physician:  Benita Stabile, MD Primary Gastroenterologist:  Dr. Jena Gauss  Reason for Consultation: Melena, acute blood loss anemia  HPI:  Patricia Jackson is a 82 y.o. female with a past medical history of GERD, atrial flutter chronically on Eliquis, iron deficiency anemia, CAD, COPD, hypertension, who presented to [facility], ER due to worsening outpatient labs of anemia.  Does note some exertional dyspnea.  In the ER found to have hemoglobin 6.0.  Improved to 9.3 today after 2 units PRBCs.  Hemoccult positive.  Patient states she has been noticing dark stools as of late.  No frank blood/hematochezia.  No abdominal pain.  No chronic NSAID use.  Does not chronically take PPIs at home.  Compliant with Eliquis last dose yesterday.  Does note some esophageal dysphagia, feels like food just sits in her substernal region at times.  This causes belching.  Started on IV Protonix twice daily.  EGD 05/26/2016, LA grade a esophagitis, hiatal hernia.  Colonoscopy 05/26/2016 sigmoid/descending diverticulosis, few small tubular adenomas removed.  Past Medical History:  Diagnosis Date   A-fib [facility])    Atrial flutter (HCC)    On Eliquis in 8/17 but discontinued after stent placement   Breast cancer (HCC)    remote   CAD in native artery 03/16/2016   COPD (chronic obstructive pulmonary disease) (HCC)    Dysrhythmia    Hypertension    Iron deficiency anemia 06/12/2021   NSTEMI (non-ST elevated myocardial infarction) (HCC)    2017   S/P angioplasty with stent 03/15/16 DES Resolute, pLCX 03/16/2016   Vitreous hemorrhage of left eye (HCC) 08/01/2019    Past Surgical History:  Procedure Laterality Date   APPENDECTOMY     BREAST SURGERY Left    CARDIAC CATHETERIZATION N/A 03/15/2016   Procedure: Left Heart Cath and Coronary Angiography;  Surgeon: Lyn Records, MD;  Location: [facility] INVASIVE CV LAB;  Service: Cardiovascular;  Laterality: N/A;   CARDIAC  CATHETERIZATION N/A 03/15/2016   Procedure: Coronary Stent Intervention;  Surgeon: Lyn Records, MD;  Location: [facility] INVASIVE CV LAB;  Service: Cardiovascular;  Laterality: N/A;   COLONOSCOPY     remote   COLONOSCOPY N/A 05/26/2016   Procedure: COLONOSCOPY;  Surgeon: Corbin Ade, MD;  Location: [facility] ENDO SUITE;  Service: Endoscopy;  Laterality: N/A;  845   ESOPHAGOGASTRODUODENOSCOPY N/A 05/26/2016   Procedure: ESOPHAGOGASTRODUODENOSCOPY (EGD);  Surgeon: Corbin Ade, MD;  Location: [facility] ENDO SUITE;  Service: Endoscopy;  Laterality: N/A;   EYE SURGERY     EYE SURGERY Left    Dr. Kai Levins DILATION N/A 05/26/2016   Procedure: Elease Hashimoto DILATION;  Surgeon: Corbin Ade, MD;  Location: [facility] ENDO SUITE;  Service: Endoscopy;  Laterality: N/A;   TOOTH EXTRACTION N/A 01/15/2022   Procedure: DENTAL RESTORATION/EXTRACTIONS;  Surgeon: Ocie Doyne, DMD;  Location: [facility] OR;  Service: Oral Surgery;  Laterality: N/A;   VITRECTOMY Left 10/03/2019   Dr. Luciana Axe, Vitrectomy, Focal Laser, Removal of Silicone Oil    Prior to Admission medications   Medication Sig Start Date End Date Taking? Authorizing Provider  acetaminophen (TYLENOL) 500 MG tablet Take 1,000 mg by mouth every 8 (eight) hours as needed for mild pain or moderate pain. 03/21/17  Yes [provider]  albuterol (PROVENTIL) (2.5 MG/3ML) 0.083% nebulizer solution Take 2.5 mg by nebulization every 6 (six) hours as needed for wheezing or shortness of breath.   Yes [provider]  amLODipine-olmesartan (AZOR) 10-40 MG  tablet Take 1 tablet by mouth daily.   Yes [provider]  apixaban (ELIQUIS) 5 MG TABS tablet TAKE ONE TABLET (5MG  TOTAL) BY MOUTH TWOTIMES DAILY Patient taking differently: Take 5 mg by mouth 2 (two) times daily. 10/08/22  Yes Antoine Poche, MD  buPROPion (WELLBUTRIN XL) 150 MG 24 hr tablet Take 150 mg by mouth daily. 11/18/22  Yes [provider]  Cholecalciferol (VITAMIN D3) 50 MCG (2000  UT) TABS Take 2,000 Units by mouth daily.   Yes [provider]  Copper Gluconate (COPPER CAPS PO) Take 6 mg by mouth daily.   Yes [provider]  denosumab (PROLIA) 60 MG/ML SOSY injection Inject 60 mg into the skin every 6 (six) months.   Yes [provider]  levothyroxine (SYNTHROID) 50 MCG tablet Take 50 mcg by mouth daily before breakfast.  09/20/19  Yes [provider]  metoprolol succinate (TOPROL XL) 25 MG 24 hr tablet Take 1 tablet (25 mg total) by mouth daily. 09/02/21  Yes Turner, Cornelious Bryant, MD  rosuvastatin (CRESTOR) 20 MG tablet Take 20 mg by mouth daily.  02/26/17  Yes [provider]  sertraline (ZOLOFT) 25 MG tablet Take 25 mg by mouth at bedtime.   Yes [provider]  [name] 200-62.5-25 MCG/ACT AEPB Inhale 1 puff into the lungs daily. 11/02/22  Yes [provider]  nitroGLYCERIN (NITROSTAT) 0.4 MG SL tablet Place 0.4 mg under the tongue every 5 (five) minutes as needed for chest pain. 06/09/19   [provider]    Current Facility-Administered Medications  Medication Dose Route Frequency Provider Last Rate Last Admin   0.9 %  sodium chloride infusion   Intravenous Continuous Lewie Chamber, MD 75 mL/hr at 11/21/22 0809 New Bag at 11/21/22 0809   acetaminophen (TYLENOL) tablet 650 mg  650 mg Oral Q6H PRN Lewie Chamber, MD   650 mg at 11/21/22 1610   Or   acetaminophen (TYLENOL) suppository 650 mg  650 mg Rectal Q6H PRN Lewie Chamber, MD       buPROPion (WELLBUTRIN XL) 24 hr tablet 300 mg  300 mg Oral Daily Lewie Chamber, MD   300 mg at 11/21/22 9604   ferric gluconate (FERRLECIT) 250 mg in sodium chloride 0.9 % 250 mL IVPB  250 mg Intravenous Daily Lewie Chamber, MD 135 mL/hr at 11/21/22 0932 250 mg at 11/21/22 0932   fluticasone furoate-vilanterol (BREO ELLIPTA) 100-25 MCG/ACT 1 puff  1 puff Inhalation Daily Lewie Chamber, MD   1 puff at 11/21/22 5409   And   umeclidinium bromide (INCRUSE ELLIPTA)  62.5 MCG/ACT 1 puff  1 puff Inhalation Daily Lewie Chamber, MD   1 puff at 11/21/22 0858   levothyroxine (SYNTHROID) tablet 50 mcg  50 mcg Oral Q0600 Lewie Chamber, MD   50 mcg at 11/21/22 0549   pantoprazole (PROTONIX) injection 40 mg  40 mg Intravenous Orlene Erm, MD   40 mg at 11/21/22 8119   sertraline (ZOLOFT) tablet 25 mg  25 mg Oral Axel Filler, MD   25 mg at 11/20/22 2256   sodium chloride flush (NS) 0.9 % injection 3 mL  3 mL Intravenous Orlene Erm, MD   3 mL at 11/21/22 0809    Allergies as of 11/20/2022 - Review Complete 11/20/2022  Allergen Reaction Noted   Sulfa antibiotics  08/02/2013    Family History  Problem Relation Age of Onset   Heart disease Mother    COPD Father  Cancer Sister        unknown primary   Cancer Brother        unknown primary   Cancer Brother    Hypertension Son    Colon cancer Neg Hx     Social History   Socioeconomic History   Marital status: Divorced    Spouse name: Not on file   Number of children: 1   Years of education: Not on file   Highest education level: Not on file  Occupational History   Occupation: Retired  Tobacco Use   Smoking status: Former    Current packs/day: 0.00    Types: Cigarettes    Quit date: 12/28/2015    Years since quitting: 6.9    Passive exposure: Past   Smokeless tobacco: Never   Tobacco comments:    Patient quit within the last 10 years  Vaping Use   Vaping status: Never Used  Substance and Sexual Activity   Alcohol use: Never   Drug use: Never   Sexual activity: Not Currently  Other Topics Concern   Not on file  Social History Narrative   ** Merged History Encounter **       Social Determinants of Health   Financial Resource Strain: Not on file  Food Insecurity: No Food Insecurity (09/12/2022)   Hunger Vital Sign    Worried About Running Out of Food in the Last Year: Never true    Ran Out of Food in the Last Year: Never true  Transportation Needs: No  Transportation Needs (09/12/2022)   PRAPARE - Administrator, Civil Service (Medical): No    Lack of Transportation (Non-Medical): No  Physical Activity: Not on file  Stress: Not on file  Social Connections: Not on file  Intimate Partner Violence: Not At Risk (09/12/2022)   Humiliation, Afraid, Rape, and Kick questionnaire    Fear of Current or Ex-Partner: No    Emotionally Abused: No    Physically Abused: No    Sexually Abused: No    Review of Systems: General: Negative for anorexia, weight loss, fever, chills, fatigue, weakness. Eyes: Negative for vision changes.  ENT: Negative for hoarseness, difficulty swallowing , nasal congestion. CV: Negative for chest pain, angina, palpitations, dyspnea on exertion, peripheral edema.  Respiratory: Negative for dyspnea at rest, positive for dyspnea on exertion, negative  cough, sputum, wheezing.  GI: See history of present illness. GU:  Negative for dysuria, hematuria, urinary incontinence, urinary frequency, nocturnal urination.  MS: Negative for joint pain, low back pain.  Derm: Negative for rash or itching.  Neuro: Negative for weakness, abnormal sensation, seizure, frequent headaches, memory loss, confusion.  Psych: Negative for anxiety, depression Endo: Negative for unusual weight change.  Heme: Negative for bruising or bleeding. Allergy: Negative for rash or hives.  Physical Exam: Vital signs in last 24 hours: Temp:  [97.1 F (36.2 C)-98.5 F (36.9 C)] 97.8 F (36.6 C) (08/18 0304) Pulse Rate:  [58-93] 81 (08/18 0304) Resp:  [13-21] 18 (08/18 0304) BP: (117-164)/(63-83) 117/80 (08/18 0304) SpO2:  [93 %-100 %] 94 % (08/18 0858) Last BM Date : 11/18/22 General:   Alert,  Well-developed, well-nourished, pleasant and cooperative in NAD Head:  Normocephalic and atraumatic. Eyes:  Sclera clear, no icterus.   Conjunctiva pink. Ears:  Normal auditory acuity. Nose:  No deformity, discharge,  or lesions. Mouth:  No deformity  or lesions, dentition normal. Neck:  Supple; no masses or thyromegaly. Lungs:  Clear throughout to auscultation.   No  wheezes, crackles, or rhonchi. No acute distress. Heart:  Regular rate and rhythm; no murmurs, clicks, rubs,  or gallops. Abdomen:  Soft, nontender and nondistended. No masses, hepatosplenomegaly or hernias noted. Normal bowel sounds, without guarding, and without rebound.   Msk:  Symmetrical without gross deformities. Normal posture. Pulses:  Normal pulses noted. Extremities:  Without clubbing or edema. Neurologic:  Alert and  oriented x4;  grossly normal neurologically. Skin:  Intact without significant lesions or rashes. Cervical Nodes:  No significant cervical adenopathy. Psych:  Alert and cooperative. Normal mood and affect.  Intake/Output from previous day: 08/17 0701 - 08/18 0700 In: 1383 [P.O.:360; Blood:1023] Out: 250 [Urine:250] Intake/Output this shift: Total I/O In: -  Out: 900 [Urine:900]  Lab Results: Recent Labs    11/20/22 1213 11/20/22 2146 11/21/22 0331  WBC 4.5  --  5.8  HGB 6.0* 9.7* 9.3*  HCT 20.4* 29.9* 29.2*  PLT PLATELET CLUMPS NOTED ON SMEAR, COUNT APPEARS ADEQUATE  --  PLATELET CLUMPS NOTED ON SMEAR, COUNT APPEARS ADEQUATE   BMET Recent Labs    11/20/22 1111 11/21/22 0331  NA 133* 133*  K 4.3 3.4*  CL 109 110  CO2 16* 15*  GLUCOSE 94 86  BUN 29* 19  CREATININE 1.29* 1.12*  CALCIUM 8.7* 8.2*   LFT Recent Labs    11/20/22 1111  PROT 6.1*  ALBUMIN 3.4*  AST 31  ALT 14  ALKPHOS 72  BILITOT 0.5   PT/INR Recent Labs    11/20/22 1111  LABPROT 18.2*  INR 1.5*   Hepatitis Panel No results for input(s): "HEPBSAG", "HCVAB", "HEPAIGM", "HEPBIGM" in the last 72 hours. C-Diff No results for input(s): "CDIFFTOX" in the last 72 hours.  Studies/Results: DG Chest Port 1 View  Result Date: 11/20/2022 CLINICAL DATA:  Shortness of breath. EXAM: PORTABLE CHEST 1 VIEW COMPARISON:  10/02/2022 FINDINGS: The cardio pericardial  silhouette is enlarged. Interstitial markings are diffusely coarsened with chronic features. The lungs are clear without focal pneumonia, edema, pneumothorax or pleural effusion. Lungs are hyperexpanded. Bones are diffusely demineralized. Telemetry leads overlie the chest. IMPRESSION: Chronic interstitial coarsening without acute cardiopulmonary findings. Electronically Signed   By: Kennith Center M.D.   On: 11/20/2022 11:14    Assessment: *Melena *Acute blood loss anemia *Systemic anticoagulation with Eliquis *Dysphagia  Plan: Etiology of patient's melena and acute blood loss anemia unclear.  Will plan on EGD with possible dilation tomorrow after 48 hours of Eliquis washout to evaluate for peptic ulcer disease, esophagitis, gastritis, H. Pylori, duodenitis, or other. Will also evaluate for esophageal stricture, Schatzki's ring, esophageal web or other.   The risks including infection, bleed, or perforation as well as benefits, limitations, alternatives and imponderables have been reviewed with the patient. Potential for esophageal dilation, biopsy, etc. have also been reviewed.  Questions have been answered. All parties agreeable.  Continue IV PPI twice daily.  Continue to monitor H&H and transfuse for less than 7.  Full liquid diet today, n.p.o. after midnight.  Thank you for the consultation  Hennie Duos. Marletta Lor, D.O. [facility]    LOS: 1 day     11/21/2022, 11:03 AM

## 2022-11-21 NOTE — Progress Notes (Signed)
   11/21/22 1736  TOC Brief Assessment  Insurance and Status Reviewed  Patient has primary care physician Yes  Home environment has been reviewed From home  Prior level of function: Independent  Prior/Current Home Services No current home services  Social Determinants of Health Reivew SDOH reviewed no interventions necessary  Readmission risk has been reviewed Yes  Transition of care needs no transition of care needs at this time

## 2022-11-22 ENCOUNTER — Inpatient Hospital Stay (HOSPITAL_COMMUNITY): Payer: 59 | Admitting: Certified Registered"

## 2022-11-22 ENCOUNTER — Telehealth: Payer: Self-pay | Admitting: Gastroenterology

## 2022-11-22 ENCOUNTER — Other Ambulatory Visit: Payer: Self-pay | Admitting: *Deleted

## 2022-11-22 ENCOUNTER — Encounter (HOSPITAL_COMMUNITY): Payer: Self-pay | Admitting: Internal Medicine

## 2022-11-22 ENCOUNTER — Encounter (HOSPITAL_COMMUNITY)
Admission: EM | Disposition: A | Discharge status: Home / Self Care | Payer: Self-pay | Source: Home / Self Care | Attending: Internal Medicine

## 2022-11-22 DIAGNOSIS — I10 Essential (primary) hypertension: Secondary | ICD-10-CM

## 2022-11-22 DIAGNOSIS — K31811 Angiodysplasia of stomach and duodenum with bleeding: Secondary | ICD-10-CM

## 2022-11-22 DIAGNOSIS — K222 Esophageal obstruction: Secondary | ICD-10-CM

## 2022-11-22 DIAGNOSIS — K3189 Other diseases of stomach and duodenum: Secondary | ICD-10-CM | POA: Diagnosis not present

## 2022-11-22 DIAGNOSIS — D62 Acute posthemorrhagic anemia: Secondary | ICD-10-CM

## 2022-11-22 DIAGNOSIS — I2510 Atherosclerotic heart disease of native coronary artery without angina pectoris: Secondary | ICD-10-CM | POA: Diagnosis not present

## 2022-11-22 DIAGNOSIS — Z87891 Personal history of nicotine dependence: Secondary | ICD-10-CM

## 2022-11-22 DIAGNOSIS — D649 Anemia, unspecified: Secondary | ICD-10-CM

## 2022-11-22 DIAGNOSIS — K921 Melena: Secondary | ICD-10-CM | POA: Diagnosis not present

## 2022-11-22 DIAGNOSIS — D508 Other iron deficiency anemias: Secondary | ICD-10-CM | POA: Diagnosis not present

## 2022-11-22 HISTORY — PX: ESOPHAGOGASTRODUODENOSCOPY (EGD) WITH PROPOFOL: SHX5813

## 2022-11-22 HISTORY — PX: HOT HEMOSTASIS: SHX5433

## 2022-11-22 LAB — CBC WITH DIFFERENTIAL/PLATELET
Abs Immature Granulocytes: 0.03 10*3/uL (ref 0.00–0.07)
Basophils Absolute: 0 10*3/uL (ref 0.0–0.1)
Basophils Relative: 1 %
Eosinophils Absolute: 0.1 10*3/uL (ref 0.0–0.5)
Eosinophils Relative: 3 %
HCT: 29.4 % — ABNORMAL LOW (ref 36.0–46.0)
Hemoglobin: 9.2 g/dL — ABNORMAL LOW (ref 12.0–15.0)
Immature Granulocytes: 1 %
Lymphocytes Relative: 16 %
Lymphs Abs: 0.6 10*3/uL — ABNORMAL LOW (ref 0.7–4.0)
MCH: 28.6 pg (ref 26.0–34.0)
MCHC: 31.3 g/dL (ref 30.0–36.0)
MCV: 91.3 fL (ref 80.0–100.0)
Monocytes Absolute: 0.6 10*3/uL (ref 0.1–1.0)
Monocytes Relative: 16 %
Neutro Abs: 2.5 10*3/uL (ref 1.7–7.7)
Neutrophils Relative %: 63 %
Platelets: ADEQUATE 10*3/uL (ref 150–400)
RBC: 3.22 MIL/uL — ABNORMAL LOW (ref 3.87–5.11)
RDW: 16.5 % — ABNORMAL HIGH (ref 11.5–15.5)
WBC: 3.9 10*3/uL — ABNORMAL LOW (ref 4.0–10.5)
nRBC: 0 % (ref 0.0–0.2)

## 2022-11-22 LAB — BASIC METABOLIC PANEL
Anion gap: 6 (ref 5–15)
BUN: 12 mg/dL (ref 8–23)
CO2: 18 mmol/L — ABNORMAL LOW (ref 22–32)
Calcium: 8.5 mg/dL — ABNORMAL LOW (ref 8.9–10.3)
Chloride: 113 mmol/L — ABNORMAL HIGH (ref 98–111)
Creatinine, Ser: 1.18 mg/dL — ABNORMAL HIGH (ref 0.44–1.00)
GFR, Estimated: 47 mL/min — ABNORMAL LOW (ref >60)
Glucose, Bld: 78 mg/dL (ref 70–99)
Potassium: 4 mmol/L (ref 3.5–5.1)
Sodium: 137 mmol/L (ref 135–145)

## 2022-11-22 LAB — MAGNESIUM: Magnesium: 2.1 mg/dL (ref 1.7–2.4)

## 2022-11-22 SURGERY — ESOPHAGOGASTRODUODENOSCOPY (EGD) WITH PROPOFOL
Anesthesia: General

## 2022-11-22 MED ORDER — FERROUS SULFATE 325 (65 FE) MG PO TBEC: 325.0000 mg | DELAYED_RELEASE_TABLET | Freq: Every day | ORAL | Status: DC

## 2022-11-22 MED ORDER — PANTOPRAZOLE SODIUM 40 MG PO TBEC: 40.0000 mg | DELAYED_RELEASE_TABLET | Freq: Two times a day (BID) | ORAL | 3 refills | Status: DC

## 2022-11-22 MED ORDER — IPRATROPIUM-ALBUTEROL 0.5-2.5 (3) MG/3ML IN SOLN: 3.0000 mL | Freq: Four times a day (QID) | RESPIRATORY_TRACT | Status: DC | PRN

## 2022-11-22 MED ORDER — LACTATED RINGERS IV SOLN: INTRAVENOUS | Status: DC | PRN

## 2022-11-22 MED ORDER — METOPROLOL TARTRATE 5 MG/5ML IV SOLN
5.0000 mg | INTRAVENOUS | Status: DC | PRN
  Administered 2022-11-22: 5 mg via INTRAVENOUS
  Filled 2022-11-22: qty 5

## 2022-11-22 MED ORDER — METOPROLOL SUCCINATE ER 25 MG PO TB24
25.0000 mg | ORAL_TABLET | Freq: Every day | ORAL | Status: DC
  Administered 2022-11-22: 25 mg via ORAL

## 2022-11-22 MED ORDER — APIXABAN 5 MG PO TABS
5.0000 mg | ORAL_TABLET | Freq: Two times a day (BID) | ORAL | Status: DC
Start: 2022-11-25 — End: 2023-02-05

## 2022-11-22 MED ORDER — PROPOFOL 10 MG/ML IV BOLUS
INTRAVENOUS | Status: DC | PRN
  Administered 2022-11-22: 50 mg via INTRAVENOUS
  Administered 2022-11-22: 30 mg via INTRAVENOUS
  Administered 2022-11-22: 80 mg via INTRAVENOUS

## 2022-11-22 MED ORDER — IPRATROPIUM-ALBUTEROL 0.5-2.5 (3) MG/3ML IN SOLN: 3.0000 mL | Freq: Once | RESPIRATORY_TRACT | Status: DC

## 2022-11-22 MED ORDER — LIDOCAINE HCL (CARDIAC) PF 100 MG/5ML IV SOSY
PREFILLED_SYRINGE | INTRAVENOUS | Status: DC | PRN
  Administered 2022-11-22: 50 mg via INTRAVENOUS

## 2022-11-22 MED ORDER — METOPROLOL TARTRATE 5 MG/5ML IV SOLN
2.5000 mg | Freq: Once | INTRAVENOUS | Status: AC
  Administered 2022-11-22: 2.5 mg via INTRAVENOUS
  Filled 2022-11-22: qty 5

## 2022-11-22 NOTE — Anesthesia Procedure Notes (Signed)
Date/Time: 11/22/2022 10:25 AM  Performed by: Julian Reil, CRNAPre-anesthesia Checklist: Patient identified, Emergency Drugs available, Suction available and Patient being monitored Patient Re-evaluated:Patient Re-evaluated prior to induction Oxygen Delivery Method: Nasal cannula Induction Type: IV induction Placement Confirmation: positive ETCO2

## 2022-11-22 NOTE — Telephone Encounter (Signed)
Noted  Phoned the pt and no ans. Vm stated to try call later

## 2022-11-22 NOTE — Anesthesia Preprocedure Evaluation (Signed)
Anesthesia Evaluation  Patient identified by MRN, date of birth, ID band Patient awake    Reviewed: Allergy & Precautions, H&P , NPO status , Patient's Chart, lab work & pertinent test results, reviewed documented beta blocker date and time   Airway Mallampati: II  TM Distance: >3 FB Neck ROM: full    Dental no notable dental hx. (+) Missing   Pulmonary neg pulmonary ROS, pneumonia, COPD, former smoker   Pulmonary exam normal breath sounds clear to auscultation       Cardiovascular Exercise Tolerance: Good hypertension, + CAD, + Past MI and + DOE  negative cardio ROS + dysrhythmias Atrial Fibrillation  Rhythm:regular Rate:Normal     Neuro/Psych  PSYCHIATRIC DISORDERS  Depression    negative neurological ROS  negative psych ROS   GI/Hepatic negative GI ROS, Neg liver ROS,,,  Endo/Other  negative endocrine ROSHypothyroidism    Renal/GU Renal diseasenegative Renal ROS  negative genitourinary   Musculoskeletal   Abdominal   Peds  Hematology negative hematology ROS (+) Blood dyscrasia, anemia   Anesthesia Other Findings   Reproductive/Obstetrics negative OB ROS                             Anesthesia Physical Anesthesia Plan  ASA: 4 and emergent  Anesthesia Plan: General   Post-op Pain Management:    Induction:   PONV Risk Score and Plan: Propofol infusion  Airway Management Planned:   Additional Equipment:   Intra-op Plan:   Post-operative Plan:   Informed Consent: I have reviewed the patients History and Physical, chart, labs and discussed the procedure including the risks, benefits and alternatives for the proposed anesthesia with the patient or authorized representative who has indicated his/her understanding and acceptance.     Dental Advisory Given  Plan Discussed with: CRNA  Anesthesia Plan Comments:        Anesthesia Quick Evaluation

## 2022-11-22 NOTE — Progress Notes (Signed)
   11/22/22 0659  Assess: MEWS Score  Temp 98.2 F (36.8 C)  BP 113/83  MAP (mmHg) 92  Pulse Rate (!) 149  Resp 16  Level of Consciousness Alert  O2 Device Room Air  Assess: MEWS Score  MEWS Temp 0  MEWS Systolic 0  MEWS Pulse 3  MEWS RR 0  MEWS LOC 0  MEWS Score 3  MEWS Score Color Yellow  Assess: if the MEWS score is Yellow or Red  Were vital signs accurate and taken at a resting state? Yes  Does the patient meet 2 or more of the SIRS criteria? No  MEWS guidelines implemented  Yes, yellow  Treat  MEWS Interventions Considered administering scheduled or prn medications/treatments as ordered  Take Vital Signs  Increase Vital Sign Frequency  Yellow: Q2hr x1, continue Q4hrs until patient remains green for 12hrs  Escalate  MEWS: Escalate Yellow: Discuss with charge nurse and consider notifying provider and/or RRT  Notify: Charge Nurse/RN  Name of Charge Nurse/RN Notified Harriett Sine, RN  Provider Notification  Provider Name/Title Girguis/Zierle-Ghosh  Date Provider Notified 11/22/22  Time Provider Notified [phone]  Method of Notification  (secure chat)  Notification Reason Change in status  Provider response See new orders  Date of Provider Response 11/22/22  Time of Provider Response 0655  Assess: SIRS CRITERIA  SIRS Temperature  0  SIRS Pulse 1  SIRS Respirations  0  SIRS WBC 0  SIRS Score Sum  1

## 2022-11-22 NOTE — Discharge Summary (Signed)
Physician Discharge Summary   Patricia Jackson BJY:[id number] DOB: 1941-03-27 DOA: 11/20/2022  PCP: Benita Stabile, MD  Admit date: 11/20/2022 Discharge date: 11/22/2022   Admitted From: Home Disposition:  Home Discharging physician: Lewie Chamber, MD Barriers to discharge: none  Recommendations at discharge: Follow up with GI  Discharge Condition: stable CODE STATUS: Full Diet recommendation:  Diet Orders (From admission, onward)     Start     Ordered   11/22/22 1133  Diet regular Fluid consistency: Thin  Diet effective now       Question:  Fluid consistency:  Answer:  Thin   11/22/22 1132   11/22/22 0000  Diet general        11/22/22 1210            Hospital Course: Patricia Jackson is a 82 yo female with PMH Gr A esophagitis (2018 EGD), positive ANA, Raynaud's, Atrial flutter (on Eliquis), IDA CAD, COPD, HTN who presented after outpatient labs revealed worsened anemia. Labs ordered by PCP she says for followup. She has been SOB recently for u/k reasons so further testing commenced. She is followed by rheumatology as well and recently seen on 10/01/2022.  She has not been paying attention to appearance of stools.  In the ER FOBT noted to be positive. Denied NSAID use nor any tobacco/etoh use.  Compliant on meds including Eliquis for aflutter.  Hemoglobin was 6 g/dL and she was ordered 2 units PRBC. She is admitted for further GI bleed workup and GI evaluation.  She underwent EGD on 11/22/2022.  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  Small angiodysplastic lesion without bleeding also noted in second portion of duodenum treated with APC.  She was continued on twice daily Protonix at discharge along with oral iron.  Eliquis was held for 2 more days after discharge.  She will follow-up outpatient with  GI.  Assessment and Plan: * GIB (gastrointestinal bleeding)-resolved as of 11/22/2022 - last EGD and colonoscopy in 2018: EGD noted Gr A esophagitis and colonoscopy with 2 polyps noted in hepatic flexure.  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 and 1 was noted to have bleeding; all treated with APC - Eliquis on hold for 2 more days after discharge - BID PPI and oral iron continued at discharge  -Outpatient follow-up with GI planned   Iron deficiency anemia - Iron 13, TIBC 371, sat ratio 4% - ferritin 25 -Giving Ferrlecit 250 mg x 3 doses -Oral iron continued at discharge  Atrial flutter (HCC) - hold eliquis; held for 2 more days at discharge then okay to resume - vitals stable; monitor for now off meds  Thrombocytopenia (HCC) - baseline around 100k - clumped on CBC  Raynaud's syndrome - follows with rheumatology   Positive ANA (antinuclear antibody) - Follows outpatient with Dr. Corliss Skains; last seen 10/01/2022  Hypothyroidism - Continue Synthroid  Depression - Continue sertraline  COPD (chronic obstructive pulmonary disease) (HCC) - no s/s exac; former smoker - continue home inhaler  CKD (chronic kidney disease), stage IV (HCC) - patient has history of  CKD4. Baseline creat ~ 1.4 - 1.6, eGFR~ 28   CAD in native artery - s/p DES stent12/11/17, pLCX   Hyperlipidemia - Continue statin  Hypertension - Home regimen resumed at discharge   The patient's chronic medical conditions were treated accordingly per the patient's home medication regimen except as noted.  On day of discharge, patient was felt deemed stable for discharge. Patient/family member advised to call PCP or come back to ER if needed.    Principal Diagnosis: GIB (gastrointestinal bleeding)  Discharge Diagnoses: Active Hospital Problems   Diagnosis Date Noted   Iron deficiency anemia 06/12/2021    Priority: 2.   Atrial flutter (HCC) 10/28/2016    Priority: 3.   Thrombocytopenia (HCC) 07/04/2019    Priority: 5.   On anticoagulant therapy 11/21/2022   ABLA (acute blood loss anemia) 11/21/2022   Positive ANA (antinuclear antibody) 11/20/2022   Raynaud's syndrome 11/20/2022   Osteoarthritis 11/20/2022   Osteoporosis 01/19/2022   Hypothyroidism 06/19/2021   COPD (chronic obstructive pulmonary disease) (HCC) 10/28/2016   CKD (chronic kidney disease), stage IV (HCC) 10/28/2016   Depression 10/28/2016   Symptomatic anemia 04/07/2016   CAD in native artery 03/16/2016   Hyperlipidemia 10/31/2015   Hypertension     Resolved Hospital Problems   Diagnosis Date Noted Date Resolved   GIB (gastrointestinal bleeding) 11/20/2022 11/22/2022    Priority: 1.     Discharge Instructions     Diet general   Complete by: As directed    Increase activity slowly   Complete by: As directed       Allergies as of 11/22/2022       Reactions   Sulfa Antibiotics    Unknown reaction         Medication List     TAKE these medications    acetaminophen 500 MG tablet Commonly known as: TYLENOL Take 1,000 mg by mouth every 8 (eight) hours as needed for mild pain or moderate pain.   albuterol (2.5 MG/3ML) 0.083% nebulizer solution Commonly known as: PROVENTIL Take 2.5 mg by nebulization every 6 (six) hours as needed for wheezing or shortness of breath.   amLODipine-olmesartan 10-40 MG tablet Commonly known as: AZOR Take 1 tablet by mouth daily.   apixaban 5 MG Tabs tablet Commonly known as: Eliquis Take 1 tablet (5 mg total) by mouth 2 (two) times daily. Start taking on: November 25, 2022 What changed:  See the new instructions. These instructions start on November 25, 2022. If you are unsure what to do until then, ask  your doctor or other care provider.   buPROPion 150 MG 24 hr tablet Commonly known as: WELLBUTRIN XL Take 150 mg by mouth daily.   COPPER CAPS PO Take 6 mg by mouth daily.   denosumab 60 MG/ML Sosy injection Commonly known as: PROLIA Inject 60 mg into the skin every 6 (six) months.   ferrous sulfate 325 (65 FE) MG EC tablet Take 1 tablet (325 mg total) by mouth daily with breakfast.   levothyroxine 50 MCG tablet Commonly known as: SYNTHROID Take 50 mcg by mouth daily before breakfast.   metoprolol succinate 25 MG 24 hr tablet Commonly known as: Toprol XL Take 1 tablet (25 mg total) by mouth daily.   nitroGLYCERIN 0.4 MG SL tablet Commonly known as: NITROSTAT Place 0.4 mg under the tongue every 5 (five) minutes as needed for chest pain.   pantoprazole 40 MG tablet Commonly known as: Protonix Take 1 tablet (40 mg total) by mouth 2 (two)  times daily.   rosuvastatin 20 MG tablet Commonly known as: CRESTOR Take 20 mg by mouth daily.   sertraline 25 MG tablet Commonly known as: ZOLOFT Take 25 mg by mouth at bedtime.   Trelegy Ellipta 200-62.5-25 MCG/ACT Aepb Generic drug: Fluticasone-Umeclidin-Vilant Inhale 1 puff into the lungs daily.   Vitamin D3 50 MCG (2000 UT) Tabs Take 2,000 Units by mouth daily.        Follow-up Information     Lanelle Bal, DO. Schedule an appointment as soon as possible for a visit in 3 week(s).   Specialty: Gastroenterology Contact information: [address] [place] [address] [phone]                Allergies  Allergen Reactions   Sulfa Antibiotics     Unknown reaction     Consultations: GI  Procedures: 8/19: EGD  Discharge Exam: BP (!) 144/74 (BP Location: Left Arm)   Pulse 74   Temp 98.4 F (36.9 C) (Oral)   Resp 20   Ht 5\' 7"  (1.702 m)   Wt 64.9 kg   SpO2 97%   BMI 22.41 kg/m  Physical Exam Constitutional:      General: She is not in acute distress.    Appearance: Normal appearance. She is  not ill-appearing.  HENT:     Head: Normocephalic and atraumatic.     Mouth/Throat:     Mouth: Mucous membranes are moist.  Eyes:     Extraocular Movements: Extraocular movements intact.  Cardiovascular:     Rate and Rhythm: Normal rate and regular rhythm.  Pulmonary:     Effort: Pulmonary effort is normal. No respiratory distress.     Breath sounds: Normal breath sounds. No wheezing.  Abdominal:     General: Bowel sounds are normal. There is no distension.     Palpations: Abdomen is soft.     Tenderness: There is no abdominal tenderness.  Musculoskeletal:        General: No swelling. Normal range of motion.     Cervical back: Normal range of motion and neck supple.  Skin:    General: Skin is warm and dry.  Neurological:     General: No focal deficit present.     Mental Status: She is alert and oriented to person, place, and time.  Psychiatric:        Mood and Affect: Mood normal.        Behavior: Behavior normal.      The results of significant diagnostics from this hospitalization (including imaging, microbiology, ancillary and laboratory) are listed below for reference.   Microbiology: No results found for this or any previous visit (from the past 240 hour(s)).   Labs: BNP (last 3 results) Recent Labs    07/26/22 1804 09/12/22 1727  BNP 497.0* 190.0*   Basic Metabolic Panel: Recent Labs  Lab 11/20/22 1111 11/21/22 0331 11/22/22 0420  NA 133* 133* 137  K 4.3 3.4* 4.0  CL 109 110 113*  CO2 16* 15* 18*  GLUCOSE 94 86 78  BUN 29* 19 12  CREATININE 1.29* 1.12* 1.18*  CALCIUM 8.7* 8.2* 8.5*  MG  --  1.8 2.1   Liver Function Tests: Recent Labs  Lab 11/20/22 1111  AST 31  ALT 14  ALKPHOS 72  BILITOT 0.5  PROT 6.1*  ALBUMIN 3.4*   No results for input(s): "LIPASE", "AMYLASE" in the last 168 hours. No results for input(s): "AMMONIA" in the last 168 hours. CBC: Recent Labs  Lab 11/20/22 1213 11/20/22 2146 11/21/22 0331 11/22/22 0420  WBC 4.5  --   5.8 3.9*  NEUTROABS  --   --  4.6 2.5  HGB 6.0* 9.7* 9.3* 9.2*  HCT 20.4* 29.9* 29.2* 29.4*  MCV 97.1  --  90.1 91.3  PLT PLATELET CLUMPS NOTED ON SMEAR, COUNT APPEARS ADEQUATE  --  PLATELET CLUMPS NOTED ON SMEAR, COUNT APPEARS ADEQUATE PLATELET CLUMPS NOTED ON SMEAR, COUNT APPEARS ADEQUATE   Cardiac Enzymes: No results for input(s): "CKTOTAL", "CKMB", "CKMBINDEX", "TROPONINI" in the last 168 hours. BNP: Invalid input(s): "POCBNP" CBG: No results for input(s): "GLUCAP" in the last 168 hours. D-Dimer No results for input(s): "DDIMER" in the last 72 hours. Hgb A1c No results for input(s): "HGBA1C" in the last 72 hours. Lipid Profile No results for input(s): "CHOL", "HDL", "LDLCALC", "TRIG", "CHOLHDL", "LDLDIRECT" in the last 72 hours. Thyroid function studies No results for input(s): "TSH", "T4TOTAL", "T3FREE", "THYROIDAB" in the last 72 hours.  Invalid input(s): "FREET3" Anemia work up Recent Labs    11/20/22 1213  VITAMINB12 554  FOLATE 10.8  FERRITIN 25  TIBC 371  IRON 13*  RETICCTPCT 2.9   Urinalysis    Component Value Date/Time   COLORURINE YELLOW 09/12/2022 2033   APPEARANCEUR CLEAR 09/12/2022 2033   LABSPEC 1.014 09/12/2022 2033   PHURINE 5.0 09/12/2022 2033   GLUCOSEU NEGATIVE 09/12/2022 2033   HGBUR NEGATIVE 09/12/2022 2033   BILIRUBINUR NEGATIVE 09/12/2022 2033   KETONESUR NEGATIVE 09/12/2022 2033   PROTEINUR NEGATIVE 09/12/2022 2033   NITRITE NEGATIVE 09/12/2022 2033   LEUKOCYTESUR NEGATIVE 09/12/2022 2033   Sepsis Labs Recent Labs  Lab 11/20/22 1213 11/21/22 0331 11/22/22 0420  WBC 4.5 5.8 3.9*   Microbiology No results found for this or any previous visit (from the past 240 hour(s)).  Procedures/Studies: DG Chest Port 1 View  Result Date: 11/20/2022 CLINICAL DATA:  Shortness of breath. EXAM: PORTABLE CHEST 1 VIEW COMPARISON:  10/02/2022 FINDINGS: The cardio pericardial silhouette is enlarged. Interstitial markings are diffusely coarsened with  chronic features. The lungs are clear without focal pneumonia, edema, pneumothorax or pleural effusion. Lungs are hyperexpanded. Bones are diffusely demineralized. Telemetry leads overlie the chest. IMPRESSION: Chronic interstitial coarsening without acute cardiopulmonary findings. Electronically Signed   By: Kennith Center M.D.   On: 11/20/2022 11:14     Time coordinating discharge: Over 30 minutes    Lewie Chamber, MD  [facility] 11/22/2022, 3:18 PM

## 2022-11-22 NOTE — Interval H&P Note (Signed)
History and Physical Interval Note:  11/22/2022 10:14 AM  Patricia Jackson  has presented today for surgery, with the diagnosis of Melena, acute blood loss anemia.  The various methods of treatment have been discussed with the patient and family. After consideration of risks, benefits and other options for treatment, the patient has consented to  Procedure(s): ESOPHAGOGASTRODUODENOSCOPY (EGD) WITH PROPOFOL (N/A) as a surgical intervention.  The patient's history has been reviewed, patient examined, no change in status, stable for surgery.  I have reviewed the patient's chart and labs.  Questions were answered to the patient's satisfaction.     Lanelle Bal

## 2022-11-22 NOTE — Care Management Important Message (Signed)
Important Message  Patient Details  Name: Patricia Jackson MRN: [id number] Date of Birth: [birth year]   Medicare Important Message Given:  N/A - LOS <3 / Initial given by admissions     Corey Harold 11/22/2022, 12:32 PM

## 2022-11-22 NOTE — Transfer of Care (Signed)
Immediate Anesthesia Transfer of Care Note  Patient: Patricia Jackson  Procedure(s) Performed: ESOPHAGOGASTRODUODENOSCOPY (EGD) WITH PROPOFOL HOT HEMOSTASIS (ARGON PLASMA COAGULATION/BICAP)  Patient Location: PACU  Anesthesia Type:General  Level of Consciousness: drowsy  Airway & Oxygen Therapy: Patient Spontanous Breathing and Patient connected to nasal cannula oxygen  Post-op Assessment: Report given to RN and Post -op Vital signs reviewed and stable  Post vital signs: Reviewed and stable  Last Vitals:  Vitals Value Taken Time  BP    Temp    Pulse    Resp    SpO2      Last Pain:  Vitals:   11/22/22 1022  TempSrc:   PainSc: 0-No pain      Patients Stated Pain Goal: 0 (11/21/22 2200)  Complications: No notable events documented.

## 2022-11-22 NOTE — Op Note (Addendum)
[facility] Patient Name: Patricia Jackson Procedure Date: 11/22/2022 10:13 AM MRN: [id number] Date of Birth: 12/24/1940 Attending MD: Hennie Duos. Marletta Lor , [place], [phone] CSN: [id number] Age: 82 Admit Type: Inpatient Procedure:                Upper GI endoscopy Indications:              Iron deficiency anemia secondary to chronic blood                            loss, Melena Providers:                Hennie Duos. Marletta Lor, DO, Crystal Page, Durwin Glaze Tech, Technician Referring MD:              Medicines:                See the Anesthesia note for documentation of the                            administered medications Complications:            No immediate complications. Estimated Blood Loss:     Estimated blood loss was minimal. Procedure:                Pre-Anesthesia Assessment:                           - The anesthesia plan was to use monitored                            anesthesia care (MAC).                           After obtaining informed consent, the endoscope was                            passed under direct vision. Throughout the                            procedure, the patient's blood pressure, pulse, and                            oxygen saturations were monitored continuously. The                            [id number]-H190 ([id number]) scope was introduced through the                            mouth, and advanced to the second part of duodenum.                            The upper GI endoscopy was accomplished without                            difficulty. The patient tolerated  the procedure                            well. Scope In: 10:26:20 AM Scope Out: 10:35:30 AM Total Procedure Duration: 0 hours 9 minutes 10 seconds  Findings:      A non-obstructing Schatzki ring was found in the distal esophagus.      A small hiatal hernia was present.      The Z-line was irregular.      A single small angiodysplastic lesion with bleeding was found  in the       gastric body. Coagulation for hemostasis using argon plasma was       successful.      Two small angiodysplastic lesions with no bleeding were found in the       gastric fundus and in the gastric body. Coagulation for bleeding       prevention using argon plasma was successful.      A single small angiodysplastic lesion without bleeding was found in the       second portion of the duodenum. Coagulation for bleeding prevention       using argon plasma was successful. Impression:               - Non-obstructing Schatzki ring.                           - Small hiatal hernia.                           - Z-line irregular.                           - A single bleeding angiodysplastic lesion in the                            stomach. Treated with argon plasma coagulation                            (APC).                           - Two non-bleeding angiodysplastic lesions in the                            stomach. Treated with argon plasma coagulation                            (APC).                           - A single non-bleeding angiodysplastic lesion in                            the duodenum. Treated with argon plasma coagulation                            (APC).                           - No specimens  collected. Moderate Sedation:      Per Anesthesia Care Recommendation:           - Return patient to hospital ward for ongoing care.                           - Soft diet.                           - Use a proton pump inhibitor PO BID.                           - Hold Eliquis x2 more days.                           - Outpatient colonoscopy                           - Recommend oral iron therapy.                           - CBC in 1 week                           - Follow up in GI office in 3-4 weeks Procedure Code(s):        --- Professional ---                           [id number], Esophagogastroduodenoscopy, flexible,                            transoral; with control of  bleeding, any method Diagnosis Code(s):        --- Professional ---                           K22.2, Esophageal obstruction                           K44.9, Diaphragmatic hernia without obstruction or                            gangrene                           K22.89, Other specified disease of esophagus                           K31.811, Angiodysplasia of stomach and duodenum                            with bleeding                           D50.0, Iron deficiency anemia secondary to blood                            loss (chronic)  K92.1, Melena (includes Hematochezia) CPT copyright 2022 American Medical Association. All rights reserved. The codes documented in this report are preliminary and upon coder review may  be revised to meet current compliance requirements. Hennie Duos. Marletta Lor, DO Hennie Duos. Margerite Impastato, DO 11/22/2022 10:41:04 AM This report has been signed electronically. Number of Addenda: 0

## 2022-11-22 NOTE — Progress Notes (Signed)
Around 0650 charge nurse informed this writer that telemetry had called regarding pt heart rate ranging in the 130's. Vitals and EKG obtained, EKG showed Afib w/ RVR. Vitals in flowsheet. MD made aware. Order given for Lopressor 2.5mg  IV, given. Oncoming nurse in room for bedside report, will continue to closely monitor this pt.

## 2022-11-22 NOTE — Telephone Encounter (Signed)
Mandy: patient needs outpatient hospital follow-up in 3-4 weeks. Any APP ( I didn't see her inpatient).   EGD with multiple AVMs and APC of one oozing. We can arrange outpatient colonoscopy.   Dena: please have her complete CBC in 1 week.

## 2022-11-22 NOTE — Progress Notes (Signed)
Mobility Specialist Progress Note:    11/22/22 1140  Mobility  Activity Ambulated with assistance to bathroom  Level of Assistance Modified independent, requires aide device or extra time  Assistive Device Other (Comment) (IV pole)  Distance Ambulated (ft) 15 ft  Range of Motion/Exercises Active;All extremities  Activity Response Tolerated well  Mobility Referral Yes  $Mobility charge 1 Mobility  Mobility Specialist Start Time (ACUTE ONLY) 1140  Mobility Specialist Stop Time (ACUTE ONLY) 1145  Mobility Specialist Time Calculation (min) (ACUTE ONLY) 5 min   Pt was received ambulating with nurse from BR>B. Required ModI using IV pole for ambulation. Tolerated well, left pt in bed, nurse in room. All needs met.  Lawerance Bach Mobility Specialist Please contact via Special educational needs teacher or  Rehab office at [phone]

## 2022-11-22 NOTE — Consult Note (Signed)
[facility]) Accountable Care Organization (ACO) [facility] Liaison Note  11/22/2022  Patricia Jackson 1940/06/10 [id number]  Location: [facility] Care Liaison screened the patient remotely at [facility].  Insurance: [employer]   [name] is a 82 y.o. female who is a Primary Care Patient of [name], [name], MD. The patient was screened for readmission hospitalization with noted extreme risk score for unplanned readmission risk with 3 IP/2 ED in 6 months.  The patient was assessed for potential [facility]) Care Management service needs for post hospital transition for care coordination. Review of patient's electronic medical record reveals patient was admitted with Gastrointestinal bleeding. Liaison spoke with pt and educated on care management services for a referral to receive community based services with a post hospital prevention readmission call from a care coordinator. Pt receptive to a follow up call for services.    Plan: Hospital liaison will make a referral for [facility] RN care coordinator to contact pt for services.    [facility] Care Management/Population Health does not replace or interfere with any arrangements made by the Inpatient Transition of Care team.   For questions contact:   Elliot Cousin, RN, [facility] Liaison [place]   Population Health Office Hours MTWF  8:00 am-6:00 pm Off on Thursday [phone] mobile [phone] [Office toll free line] Office Hours are M-F 8:30 - 5 pm [email or web address]

## 2022-11-23 ENCOUNTER — Encounter (HOSPITAL_COMMUNITY): Payer: Self-pay | Admitting: Internal Medicine

## 2022-11-23 ENCOUNTER — Other Ambulatory Visit: Payer: Self-pay

## 2022-11-23 ENCOUNTER — Telehealth: Payer: Self-pay | Admitting: *Deleted

## 2022-11-23 DIAGNOSIS — D62 Acute posthemorrhagic anemia: Secondary | ICD-10-CM

## 2022-11-23 DIAGNOSIS — D649 Anemia, unspecified: Secondary | ICD-10-CM

## 2022-11-23 NOTE — Anesthesia Postprocedure Evaluation (Signed)
Anesthesia Post Note  Patient: Shanzay Armbrecht  Procedure(s) Performed: ESOPHAGOGASTRODUODENOSCOPY (EGD) WITH PROPOFOL HOT HEMOSTASIS (ARGON PLASMA COAGULATION/BICAP)  Patient location during evaluation: Phase II Anesthesia Type: General Level of consciousness: awake Pain management: pain level controlled Vital Signs Assessment: post-procedure vital signs reviewed and stable Respiratory status: spontaneous breathing and respiratory function stable Cardiovascular status: blood pressure returned to baseline and stable Postop Assessment: no headache and no apparent nausea or vomiting Anesthetic complications: no Comments: Late entry   No notable events documented.   Last Vitals:  Vitals:   11/22/22 1100 11/22/22 1120  BP: 135/77 (!) 144/74  Pulse: 75 74  Resp: 20 20  Temp:  36.9 C  SpO2: 98% 97%    Last Pain:  Vitals:   11/22/22 1120  TempSrc: Oral  PainSc:                  [name]

## 2022-11-23 NOTE — Progress Notes (Signed)
  Care Coordination   Note   11/23/2022 Name: Patricia Jackson MRN: [id number] DOB: [birth year]  Patricia Jackson is a 82 y.o. year old female who sees [name], [name], MD for primary care. I reached out to Festus Barren by phone today to offer care coordination services.  [name] was given information about Care Coordination services today including:   The Care Coordination services include support from the care team which includes your Nurse Coordinator, Clinical Social Worker, or Pharmacist.  The Care Coordination team is here to help remove barriers to the health concerns and goals most important to you. Care Coordination services are voluntary, and the patient may decline or stop services at any time by request to their care team member.   Care Coordination Consent Status: Patient agreed to services and verbal consent obtained.   Follow up plan:  Telephone appointment with care coordination team member scheduled for:  11/26/22  Encounter Outcome:  Pt. Scheduled  [facility] Coordination Care Guide  Direct Dial: [phone]

## 2022-11-23 NOTE — Telephone Encounter (Signed)
NOTED  Phoned the pt and advised of her instructions regarding her getting labs done. Pt going to [facility] next week. Lab placed and faxed

## 2022-11-26 ENCOUNTER — Ambulatory Visit: Payer: Self-pay | Admitting: *Deleted

## 2022-11-26 NOTE — Patient Outreach (Signed)
  Care Coordination   Initial Visit Note   11/29/2022 Name: Patricia Jackson MRN: [id number] DOB: December 30, 1940  Patricia Jackson is a 82 y.o. year old female who sees [name], [name], MD for primary care. I spoke with  Festus Barren by phone today.  What matters to the patients health and wellness today?  Anemia recovery- status post esophagogastroduodenoscopy 11/22/22- She reports she is feeling much better since her recent hospital discharge on 11/22/22. Denies any worsening symptoms She is to return to get further labs on 11/30/22   Chronic Kidney disease (CKD) denies swelling, voiding concern. Per patient she is pending a call to schedule a nephrology appointment     Goals Addressed             This Visit's Progress    RN CM  care coordination   On track    Interventions Today    Flowsheet Row Most Recent Value  Chronic Disease   Chronic disease during today's visit Chronic Obstructive Pulmonary Disease (COPD), Atrial Fibrillation (AFib), Chronic Kidney Disease/End Stage Renal Disease (ESRD), Other  [anemia recovery- assessed for any worsening symptoms]  General Interventions   General Interventions Discussed/Reviewed General Interventions Discussed, Doctor Visits  Doctor Visits Discussed/Reviewed Doctor Visits Discussed, PCP, Specialist  PCP/Specialist Visits Compliance with follow-up visit  Exercise Interventions   Exercise Discussed/Reviewed Exercise Discussed, Physical Activity  Physical Activity Discussed/Reviewed Physical Activity Discussed  Education Interventions   Education Provided Provided Education, Provided Web-based Education  Provided Verbal Education On Community Resources, Mental Health/Coping with Illness  Mental Health Interventions   Mental Health Discussed/Reviewed Mental Health Discussed, Coping Strategies  Nutrition Interventions   Nutrition Discussed/Reviewed Nutrition Discussed, Fluid intake, Decreasing salt  Pharmacy Interventions   Pharmacy  Dicussed/Reviewed Pharmacy Topics Discussed, Affording Medications              SDOH assessments and interventions completed:  Yes  SDOH Interventions Today    Flowsheet Row Most Recent Value  SDOH Interventions   Food Insecurity Interventions Intervention Not Indicated  Financial Strain Interventions Intervention Not Indicated  Stress Interventions Intervention Not Indicated  Health Literacy Interventions Intervention Not Indicated        Care Coordination Interventions:  Yes, provided   Follow up plan: Follow up call scheduled for 12/27/22 3 pm    Encounter Outcome:  Pt. Visit Completed   Nikelle Malatesta L. Noelle Penner, RN, BSN, CCM [facility] Care Management Community Coordinator Office number [phone]

## 2022-11-26 NOTE — Patient Instructions (Signed)
Visit Information  Thank you for taking time to visit with me today. Please don't hesitate to contact me if I can be of assistance to you.   Following are the goals we discussed today:   Goals Addressed             This Visit's Progress    RN CM  care coordination   On track    Interventions Today    Flowsheet Row Most Recent Value  Chronic Disease   Chronic disease during today's visit Chronic Obstructive Pulmonary Disease (COPD), Atrial Fibrillation (AFib), Chronic Kidney Disease/End Stage Renal Disease (ESRD), Other  [anemia recovery- assessed for any worsening symptoms]  General Interventions   General Interventions Discussed/Reviewed General Interventions Discussed, Doctor Visits  Doctor Visits Discussed/Reviewed Doctor Visits Discussed, PCP, Specialist  PCP/Specialist Visits Compliance with follow-up visit  Exercise Interventions   Exercise Discussed/Reviewed Exercise Discussed, Physical Activity  Physical Activity Discussed/Reviewed Physical Activity Discussed  Education Interventions   Education Provided Provided Education, Provided Web-based Education  Provided Verbal Education On Community Resources, Mental Health/Coping with Illness  Mental Health Interventions   Mental Health Discussed/Reviewed Mental Health Discussed, Coping Strategies  Nutrition Interventions   Nutrition Discussed/Reviewed Nutrition Discussed, Fluid intake, Decreasing salt  Pharmacy Interventions   Pharmacy Dicussed/Reviewed Pharmacy Topics Discussed, Affording Medications              Our next appointment is by telephone on 12/27/22 at 3 pm  Please call the care guide team at [phone] if you need to cancel or reschedule your appointment.   If you are experiencing a Mental Health or Behavioral Health Crisis or need someone to talk to, please call the Suicide and Crisis Lifeline: 988 call the [place] National Suicide Prevention Lifeline: (762)484-3222 or TTY: 305-514-2809 TTY  229 273 2388) to talk to a trained counselor call 1-800-273-TALK (toll free, 24 hour hotline) call the [facility]: [phone] call 911   Patient verbalizes understanding of instructions and care plan provided today and agrees to view in MyChart. Active MyChart status and patient understanding of how to access instructions and care plan via MyChart confirmed with patient.     The patient has been provided with contact information for the care management team and has been advised to call with any health related questions or concerns.   Eulla Kochanowski L. Noelle Penner, RN, BSN, CCM [facility] Care Management Community Coordinator Office number [phone]

## 2022-11-27 DIAGNOSIS — R410 Disorientation, unspecified: Secondary | ICD-10-CM | POA: Diagnosis not present

## 2022-11-27 DIAGNOSIS — E871 Hypo-osmolality and hyponatremia: Secondary | ICD-10-CM | POA: Diagnosis not present

## 2022-11-27 DIAGNOSIS — M810 Age-related osteoporosis without current pathological fracture: Secondary | ICD-10-CM | POA: Diagnosis not present

## 2022-11-27 DIAGNOSIS — R0609 Other forms of dyspnea: Secondary | ICD-10-CM | POA: Diagnosis not present

## 2022-12-01 ENCOUNTER — Other Ambulatory Visit (HOSPITAL_COMMUNITY)
Admission: RE | Admit: 2022-12-01 | Discharge: 2022-12-01 | Disposition: A | Discharge status: Home / Self Care | Payer: 59 | Source: Ambulatory Visit | Attending: Gastroenterology | Admitting: Gastroenterology

## 2022-12-01 DIAGNOSIS — D649 Anemia, unspecified: Secondary | ICD-10-CM | POA: Diagnosis present

## 2022-12-01 DIAGNOSIS — D62 Acute posthemorrhagic anemia: Secondary | ICD-10-CM | POA: Insufficient documentation

## 2022-12-01 LAB — CBC WITH DIFFERENTIAL/PLATELET
Abs Immature Granulocytes: 0.03 10*3/uL (ref 0.00–0.07)
Basophils Absolute: 0 10*3/uL (ref 0.0–0.1)
Basophils Relative: 1 %
Eosinophils Absolute: 0.1 10*3/uL (ref 0.0–0.5)
Eosinophils Relative: 2 %
HCT: 33.6 % — ABNORMAL LOW (ref 36.0–46.0)
Hemoglobin: 10.5 g/dL — ABNORMAL LOW (ref 12.0–15.0)
Immature Granulocytes: 1 %
Lymphocytes Relative: 17 %
Lymphs Abs: 0.7 10*3/uL (ref 0.7–4.0)
MCH: 29.5 pg (ref 26.0–34.0)
MCHC: 31.3 g/dL (ref 30.0–36.0)
MCV: 94.4 fL (ref 80.0–100.0)
Monocytes Absolute: 0.3 10*3/uL (ref 0.1–1.0)
Monocytes Relative: 8 %
Neutro Abs: 3.2 10*3/uL (ref 1.7–7.7)
Neutrophils Relative %: 71 %
Platelets: ADEQUATE 10*3/uL (ref 150–400)
RBC: 3.56 MIL/uL — ABNORMAL LOW (ref 3.87–5.11)
RDW: 16.8 % — ABNORMAL HIGH (ref 11.5–15.5)
WBC: 4.4 10*3/uL (ref 4.0–10.5)
nRBC: 0 % (ref 0.0–0.2)

## 2022-12-17 ENCOUNTER — Ambulatory Visit: Payer: 59 | Attending: Cardiology | Admitting: Cardiology

## 2022-12-17 ENCOUNTER — Encounter: Payer: Self-pay | Admitting: Cardiology

## 2022-12-17 VITALS — BP 100/55 | HR 60 | Ht 67.0 in | Wt 136.0 lb

## 2022-12-17 DIAGNOSIS — I10 Essential (primary) hypertension: Secondary | ICD-10-CM

## 2022-12-17 DIAGNOSIS — I2510 Atherosclerotic heart disease of native coronary artery without angina pectoris: Secondary | ICD-10-CM

## 2022-12-17 DIAGNOSIS — D6869 Other thrombophilia: Secondary | ICD-10-CM | POA: Diagnosis not present

## 2022-12-17 DIAGNOSIS — E782 Mixed hyperlipidemia: Secondary | ICD-10-CM | POA: Diagnosis not present

## 2022-12-17 DIAGNOSIS — I4892 Unspecified atrial flutter: Secondary | ICD-10-CM | POA: Diagnosis not present

## 2022-12-17 MED ORDER — AMLODIPINE-OLMESARTAN 10-20 MG PO TABS: 1.0000 | ORAL_TABLET | Freq: Every day | ORAL | 3 refills | Status: DC

## 2022-12-17 NOTE — Patient Instructions (Signed)
Medication Instructions:  Your physician has recommended you make the following change in your medication:   -Decrease Amlodipine/Olmesartan to 10-20 mg tablet once daily.   *If you need a refill on your cardiac medications before your next appointment, please call your pharmacy*   Lab Work: None If you have labs (blood work) drawn today and your tests are completely normal, you will receive your results only by: MyChart Message (if you have MyChart) OR A paper copy in the mail If you have any lab test that is abnormal or we need to change your treatment, we will call you to review the results.   Testing/Procedures: None   Follow-Up: At [facility], you and your health needs are our priority.  As part of our continuing mission to provide you with exceptional heart care, we have created designated Provider Care Teams.  These Care Teams include your primary Cardiologist (physician) and Advanced Practice Providers (APPs -  Physician Assistants and Nurse Practitioners) who all work together to provide you with the care you need, when you need it.  We recommend signing up for the patient portal called "MyChart".  Sign up information is provided on this After Visit Summary.  MyChart is used to connect with patients for Virtual Visits (Telemedicine).  Patients are able to view lab/test results, encounter notes, upcoming appointments, etc.  Non-urgent messages can be sent to your provider as well.   To learn more about what you can do with MyChart, go to [email or web address].    Your next appointment:   6 month(s)  Provider:   You may see Dina Rich, MD or one of the following Advanced Practice Providers on your designated Care Team:   Randall An, PA-C  Jacolyn Reedy, [place]     Other Instructions

## 2022-12-17 NOTE — Progress Notes (Signed)
Clinical Summary Patricia Jackson is a 82 y.o.female seen today for follow up of the following medical problems.      1. CAD -DES to LCX in 2017 in setting of NSTEMI - 09/2022 echo: LVEF 60-65%, no WMAs  - rare infrequent chest pains, dull pain. Lasts about 1 second.  - chronic SOB, mild cough related to her COPD     2. Aflutter - admit 11/2015 pneumonia, found to be in aflutter - denies any recent symptoms   - history of severe anemia during admission Jan 2018 while on ASA/plavix/eliquis after stent      - eliquis held for brief period in Aug when admitted with GI bleed, now back on - no evidence of recurrent bleeding. F/u Hgb since discharged had improved to 10 - no palpitations.      3. COPD - followed by pulmonary     4. Anemia - elquis stopped during prior admission with anemia Jan 2018, transfused at that time. She was on brilinta and eliquis at the time, changed to just plavix.        4. Raynauds syndrome - followed by rheum     5. HTN - compliant with meds   6.Carotid stenosis - 06/2017 carotid US: RICA 1-39%, LICA 40-59%   7. GI bleed - admit 11/2022 with GI bleed - Hgb 6 g/dL on admission; received 2 units PRBC. 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 HLD - 10/2022 TC 137 TG 135 HDL 44 LDL 69  Past Medical History:  Diagnosis Date   A-fib [facility])    Atrial flutter (HCC)    On Eliquis in 8/17 but discontinued after stent placement   Breast cancer (HCC)    remote   CAD in native artery 03/16/2016   COPD (chronic obstructive pulmonary disease) (HCC)    Dysrhythmia    Hypertension    Iron deficiency anemia 06/12/2021   NSTEMI (non-ST elevated myocardial infarction) (HCC)    2017   S/P angioplasty with stent 03/15/16 DES Resolute, pLCX 03/16/2016   Vitreous hemorrhage of left eye (HCC) 08/01/2019     Allergies  Allergen Reactions   Sulfa Antibiotics     Unknown  reaction      Current Outpatient Medications  Medication Sig Dispense Refill   acetaminophen (TYLENOL) 500 MG tablet Take 1,000 mg by mouth every 8 (eight) hours as needed for mild pain or moderate pain.     albuterol (PROVENTIL) (2.5 MG/3ML) 0.083% nebulizer solution Take 2.5 mg by nebulization every 6 (six) hours as needed for wheezing or shortness of breath.     amLODipine-olmesartan (AZOR) 10-40 MG tablet Take 1 tablet by mouth daily.     apixaban (ELIQUIS) 5 MG TABS tablet Take 1 tablet (5 mg total) by mouth 2 (two) times daily.     buPROPion (WELLBUTRIN XL) 150 MG 24 hr tablet Take 150 mg by mouth daily.     Cholecalciferol (VITAMIN D3) 50 MCG (2000 UT) TABS Take 2,000 Units by mouth daily.     Copper Gluconate (COPPER CAPS PO) Take 6 mg by mouth daily.     denosumab (PROLIA) 60 MG/ML SOSY injection Inject 60 mg into the skin every 6 (six) months.     ferrous sulfate 325 (65 FE) MG EC tablet Take 1 tablet (325 mg total) by mouth daily with breakfast.     levothyroxine (  SYNTHROID) 50 MCG tablet Take 50 mcg by mouth daily before breakfast.      metoprolol succinate (TOPROL XL) 25 MG 24 hr tablet Take 1 tablet (25 mg total) by mouth daily. 90 tablet 3   nitroGLYCERIN (NITROSTAT) 0.4 MG SL tablet Place 0.4 mg under the tongue every 5 (five) minutes as needed for chest pain.     pantoprazole (PROTONIX) 40 MG tablet Take 1 tablet (40 mg total) by mouth 2 (two) times daily. 60 tablet 3   rosuvastatin (CRESTOR) 20 MG tablet Take 20 mg by mouth daily.      sertraline (ZOLOFT) 25 MG tablet Take 25 mg by mouth at bedtime.     TRELEGY ELLIPTA 200-62.5-25 MCG/ACT AEPB Inhale 1 puff into the lungs daily.     No current facility-administered medications for this visit.     Past Surgical History:  Procedure Laterality Date   APPENDECTOMY     BREAST SURGERY Left    CARDIAC CATHETERIZATION N/A 03/15/2016   Procedure: Left Heart Cath and Coronary Angiography;  Surgeon: Lyn Records, MD;   Location: [facility] INVASIVE CV LAB;  Service: Cardiovascular;  Laterality: N/A;   CARDIAC CATHETERIZATION N/A 03/15/2016   Procedure: Coronary Stent Intervention;  Surgeon: Lyn Records, MD;  Location: [facility] INVASIVE CV LAB;  Service: Cardiovascular;  Laterality: N/A;   COLONOSCOPY     remote   COLONOSCOPY N/A 05/26/2016   Procedure: COLONOSCOPY;  Surgeon: Corbin Ade, MD;  Location: [facility] ENDO SUITE;  Service: Endoscopy;  Laterality: N/A;  845   ESOPHAGOGASTRODUODENOSCOPY N/A 05/26/2016   Procedure: ESOPHAGOGASTRODUODENOSCOPY (EGD);  Surgeon: Corbin Ade, MD;  Location: [facility] ENDO SUITE;  Service: Endoscopy;  Laterality: N/A;   ESOPHAGOGASTRODUODENOSCOPY (EGD) WITH PROPOFOL N/A 11/22/2022   Procedure: ESOPHAGOGASTRODUODENOSCOPY (EGD) WITH PROPOFOL;  Surgeon: Lanelle Bal, DO;  Location: [facility] ENDO SUITE;  Service: Endoscopy;  Laterality: N/A;   EYE SURGERY     EYE SURGERY Left    Dr. Barbaraann Barthel   HOT HEMOSTASIS  11/22/2022   Procedure: HOT HEMOSTASIS (ARGON PLASMA COAGULATION/BICAP);  Surgeon: Lanelle Bal, DO;  Location: [facility] ENDO SUITE;  Service: Endoscopy;;   MALONEY DILATION N/A 05/26/2016   Procedure: Elease Hashimoto DILATION;  Surgeon: Corbin Ade, MD;  Location: [facility] ENDO SUITE;  Service: Endoscopy;  Laterality: N/A;   TOOTH EXTRACTION N/A 01/15/2022   Procedure: DENTAL RESTORATION/EXTRACTIONS;  Surgeon: Ocie Doyne, DMD;  Location: [facility] OR;  Service: Oral Surgery;  Laterality: N/A;   VITRECTOMY Left 10/03/2019   Dr. Luciana Axe, Vitrectomy, Focal Laser, Removal of Silicone Oil     Allergies  Allergen Reactions   Sulfa Antibiotics     Unknown reaction       Family History  Problem Relation Age of Onset   Heart disease Mother    COPD Father    Cancer Sister        unknown primary   Cancer Brother        unknown primary   Cancer Brother    Hypertension Son    Colon cancer Neg Hx      Social History Ms. Czerwonka reports that she quit smoking about 6 years ago. Her smoking use included  cigarettes. She has been exposed to tobacco smoke. She has never used smokeless tobacco. Ms. Grattan reports no history of alcohol use.   Review of Systems CONSTITUTIONAL: No weight loss, fever, chills, weakness or fatigue.  HEENT: Eyes: No visual loss, blurred vision, double vision or yellow sclerae.No hearing loss, sneezing, congestion, runny  nose or sore throat.  SKIN: No rash or itching.  CARDIOVASCULAR: per hpi RESPIRATORY: No shortness of breath, cough or sputum.  GASTROINTESTINAL: No anorexia, nausea, vomiting or diarrhea. No abdominal pain or blood.  GENITOURINARY: No burning on urination, no polyuria NEUROLOGICAL: No headache, dizziness, syncope, paralysis, ataxia, numbness or tingling in the extremities. No change in bowel or bladder control.  MUSCULOSKELETAL: No muscle, back pain, joint pain or stiffness.  LYMPHATICS: No enlarged nodes. No history of splenectomy.  PSYCHIATRIC: No history of depression or anxiety.  ENDOCRINOLOGIC: No reports of sweating, cold or heat intolerance. No polyuria or polydipsia.  Marland Kitchen   Physical Examination Today's Vitals   12/17/22 1050 12/17/22 1114  BP: (!) 98/58 (!) 100/55  Pulse: 60   SpO2: 100%   Weight: 136 lb (61.7 kg)   Height: 5\' 7"  (1.702 m)    Body mass index is 21.3 kg/m.  Gen: resting comfortably, no acute distress HEENT: no scleral icterus, pupils equal round and reactive, no palptable cervical adenopathy,  CV: RRR, no m/rg, no jvd Resp: Clear to auscultation bilaterally GI: abdomen is soft, non-tender, non-distended, normal bowel sounds, no hepatosplenomegaly MSK: extremities are warm, no edema.  Skin: warm, no rash Neuro:  no focal deficits Psych: appropriate affect   Diagnostic Studies  11/2013 Echo Study Conclusions  - Left ventricle: The cavity size was normal. Wall thickness was at the upper limits of normal. Systolic function was vigorous. The estimated ejection fraction was in the range of 65% to 70%.  Wall motion was normal; there were no regional wall motion abnormalities. Doppler parameters are consistent with abnormal left ventricular relaxation (grade 1 diastolic dysfunction). Doppler parameters are consistent with elevated ventricular end-diastolic filling pressure. - Mitral valve: Calcified annulus. There was mild regurgitation. - Right atrium: Central venous pressure (est): 3 mm Hg. - Atrial septum: No defect or patent foramen ovale was identified. - Tricuspid valve: There was trivial regurgitation. - Pulmonary arteries: PA peak pressure: 28 mm Hg (S). - Pericardium, extracardiac: There was no pericardial effusion.  Impressions:  - Upper normal LV wall thickness with LVEF 65-70%, grade 1 diastolic dysfunction with increased filling pressures. Mild mitral regurgitation. Trivial tricuspid regurgitation with normal PASP 28 mmHg.   10/2015 Echo Study Conclusions   - Left ventricle: Systolic function was normal. The estimated   ejection fraction was in the range of 55% to 60%. Wall motion was   normal; there were no regional wall motion abnormalities. 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 - Mitral valve: Calcified annulus. There was mild to moderate   regurgitation directed centrally. Valve area by pressure   half-time: 2.34 cm^2. - Pulmonary arteries: Systolic pressure was mildly increased. PA   peak pressure: 41 mm Hg (S).   03/2016 cath The left ventricular ejection fraction is 55-65% by visual estimate. The left ventricular systolic function is normal. A STENT RESOLUTE ONYX 2.75X12 drug eluting stent was successfully placed, and does not overlap previously placed stent. Prox Cx to Mid Cx lesion, 99 %stenosed. Post intervention, there is a 0% residual stenosis.   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 Otherwise widely patent coronary arteries Successful angioplasty and stenting of the proximal circumflex  reducing a greater than 95% stenosis to 0% with TIMI grade 3 flow. Resolute Onyx 2.75 x 12 mm stent deployed at 14 atm.   Recommendations:   Aspirin, Brilinta, and Eliquis x 1 month then drop aspirin. See further instruction below for 3  months and beyond. Eliquis should start in AM as long as no recurrent AF/Afl. Would also consider switch to Plavix at 3 months to decrease the risk of bleeding on Eliquis.     06/2021 echo 1. Left ventricular ejection fraction, by estimation, is 60 to 65%. The  left ventricle has normal function. The left ventricle has no regional  wall motion abnormalities. There is mild left ventricular hypertrophy.  Left ventricular diastolic parameters  are indeterminate. Elevated left atrial pressure.   2. Right ventricular systolic function is normal. The right ventricular  size is normal. There is mildly elevated pulmonary artery systolic  pressure.   3. The mitral valve is abnormal. Mild mitral valve regurgitation.  Moderate mitral stenosis. The mean mitral valve gradient is 6.0 mmHg.   4. The tricuspid valve is abnormal.   5. The aortic valve is tricuspid. There is mild calcification of the  aortic valve. There is mild thickening of the aortic valve. Aortic valve  regurgitation is not visualized. No aortic stenosis is present.   6. The inferior vena cava is normal in size with greater than 50%  respiratory variability, suggesting right atrial pressure of 3 mmHg.   09/2022 echo IMPRESSIONS     1. Left ventricular ejection fraction, by estimation, is 60 to 65%. The  left ventricle has normal function. The left ventricle has no regional  wall motion abnormalities. There is mild left ventricular hypertrophy.  Left ventricular diastolic parameters  are indeterminate. Elevated left atrial pressure.   2. Right ventricular systolic function is normal. The right ventricular  size is normal.   3. The mitral valve is abnormal. Mild mitral valve regurgitation. No   evidence of mitral stenosis.   4. The aortic valve is tricuspid. There is mild calcification of the  aortic valve. There is mild thickening of the aortic valve. Aortic valve  regurgitation is not visualized. No aortic stenosis is present.   5. The inferior vena cava is normal in size with greater than 50%  respiratory variability, suggesting right atrial pressure of 3 mmHg.    Assessment and Plan   1. CAD - no symptoms, continue current meds   2. Aflutter/acquired thrombophilia - no symptoms, continue current meds incliuding eliquis for stroke prevention    3. HTN - low normal bp today, we will lower her amlodopine/olmesartan combo to 10/20mg  daily   4.HLD - at goal, continue current meds     F/u 6 months    Antoine Poche, M.D.

## 2022-12-20 DIAGNOSIS — H35051 Retinal neovascularization, unspecified, right eye: Secondary | ICD-10-CM | POA: Diagnosis not present

## 2022-12-20 DIAGNOSIS — H4311 Vitreous hemorrhage, right eye: Secondary | ICD-10-CM | POA: Diagnosis not present

## 2022-12-20 DIAGNOSIS — H3561 Retinal hemorrhage, right eye: Secondary | ICD-10-CM | POA: Diagnosis not present

## 2022-12-20 DIAGNOSIS — H353231 Exudative age-related macular degeneration, bilateral, with active choroidal neovascularization: Secondary | ICD-10-CM | POA: Diagnosis not present

## 2022-12-20 DIAGNOSIS — H33001 Unspecified retinal detachment with retinal break, right eye: Secondary | ICD-10-CM | POA: Diagnosis not present

## 2022-12-22 NOTE — Progress Notes (Deleted)
Referring Provider: Benita Stabile, MD Primary Care Physician:  Benita Stabile, MD Primary GI Physician: Dr. Bonnetta Barry chief complaint on file.   HPI:   Patricia Jackson is a 81 y.o. female  with a past medical history of GERD, atrial flutter chronically on Eliquis, iron deficiency anemia, CAD, COPD, hypertension, presenting today for hospital follow-up of IDA  Patient was admitted to the hospital in mid August with worsening anemia, found to have a hemoglobin of 6.0 with iron deficiency and heme positive stool.  Patient reported dark stools recently but no frank blood or hematochezia.  Noted some esophageal dysphagia, but no other significant GI symptoms.  She was not taking PPI outpatient.  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  She was recommended to use PPI twice daily, start oral iron, hold Eliquis for 2 more days, and have outpatient colonoscopy.  She received 2 units PRBCs and hemoglobin improved to 9.2 on 8/19 (day of discharge).   Repeat CBC 8/28 with hemoglobin improved to 10.5.      Colonoscopy 05/26/2016 sigmoid/descending diverticulosis, few small tubular adenomas removed.    Past Medical History:  Diagnosis Date   A-fib [facility])    Atrial flutter (HCC)    On Eliquis in 8/17 but discontinued after stent placement   Breast cancer (HCC)    remote   CAD in native artery 03/16/2016   COPD (chronic obstructive pulmonary disease) (HCC)    Dysrhythmia    Hypertension    Iron deficiency anemia 06/12/2021   NSTEMI (non-ST elevated myocardial infarction) (HCC)    2017   S/P angioplasty with stent 03/15/16 DES Resolute, pLCX 03/16/2016   Vitreous hemorrhage of left eye (HCC) 08/01/2019    Past Surgical History:  Procedure Laterality Date   APPENDECTOMY      BREAST SURGERY Left    CARDIAC CATHETERIZATION N/A 03/15/2016   Procedure: Left Heart Cath and Coronary Angiography;  Surgeon: Lyn Records, MD;  Location: [facility] INVASIVE CV LAB;  Service: Cardiovascular;  Laterality: N/A;   CARDIAC CATHETERIZATION N/A 03/15/2016   Procedure: Coronary Stent Intervention;  Surgeon: Lyn Records, MD;  Location: [facility] INVASIVE CV LAB;  Service: Cardiovascular;  Laterality: N/A;   COLONOSCOPY     remote   COLONOSCOPY N/A 05/26/2016   Procedure: COLONOSCOPY;  Surgeon: Corbin Ade, MD;  Location: [facility] ENDO SUITE;  Service: Endoscopy;  Laterality: N/A;  845   ESOPHAGOGASTRODUODENOSCOPY N/A 05/26/2016   Procedure: ESOPHAGOGASTRODUODENOSCOPY (EGD);  Surgeon: Corbin Ade, MD;  Location: [facility] ENDO SUITE;  Service: Endoscopy;  Laterality: N/A;   ESOPHAGOGASTRODUODENOSCOPY (EGD) WITH PROPOFOL N/A 11/22/2022   Procedure: ESOPHAGOGASTRODUODENOSCOPY (EGD) WITH PROPOFOL;  Surgeon: Lanelle Bal, DO;  Location: [facility] ENDO SUITE;  Service: Endoscopy;  Laterality: N/A;   EYE SURGERY     EYE SURGERY Left    Dr. Barbaraann Barthel   HOT HEMOSTASIS  11/22/2022   Procedure: HOT HEMOSTASIS (ARGON PLASMA COAGULATION/BICAP);  Surgeon: Lanelle Bal, DO;  Location: [facility];  Service: Endoscopy;;   MALONEY DILATION N/A 05/26/2016   Procedure: Elease Hashimoto DILATION;  Surgeon: Corbin Ade, MD;  Location: [facility] END[facility];  Service: Endoscopy;  Laterality: N/A;   TOOTH EXTRACTION N/A 01/15/2022   Procedure: DENTAL RESTORATION/EXTRACTIONS;  Surgeon: Ocie Doyne, DMD;  Location: [facility] OR;  Service: Oral Surgery;  Laterality: N/A;   VITRECTOMY Left 10/03/2019   Dr. Luciana Axe, Vitrectomy, Focal Laser, Removal of Silicone Oil    Current Outpatient Medications  Medication Sig Dispense Refill   acetaminophen (TYLENOL) 500 MG tablet Take 1,000 mg by mouth every 8 (eight) hours as needed for mild pain or moderate pain.     albuterol (PROVENTIL) (2.5 MG/3ML) 0.083% nebulizer solution Take 2.5 mg by  nebulization every 6 (six) hours as needed for wheezing or shortness of breath.     amlodipine-olmesartan (AZOR) 10-20 MG tablet Take 1 tablet by mouth daily. 90 tablet 3   apixaban (ELIQUIS) 5 MG TABS tablet Take 1 tablet (5 mg total) by mouth 2 (two) times daily.     buPROPion (WELLBUTRIN XL) 150 MG 24 hr tablet Take 150 mg by mouth daily.     Cholecalciferol (VITAMIN D3) 50 MCG (2000 UT) TABS Take 2,000 Units by mouth daily.     Copper Gluconate (COPPER CAPS PO) Take 6 mg by mouth daily.     denosumab (PROLIA) 60 MG/ML SOSY injection Inject 60 mg into the skin every 6 (six) months.     ferrous sulfate 325 (65 FE) MG EC tablet Take 1 tablet (325 mg total) by mouth daily with breakfast.     levothyroxine (SYNTHROID) 50 MCG tablet Take 50 mcg by mouth daily before breakfast.      metoprolol succinate (TOPROL XL) 25 MG 24 hr tablet Take 1 tablet (25 mg total) by mouth daily. 90 tablet 3   nitroGLYCERIN (NITROSTAT) 0.4 MG SL tablet Place 0.4 mg under the tongue every 5 (five) minutes as needed for chest pain.     pantoprazole (PROTONIX) 40 MG tablet Take 1 tablet (40 mg total) by mouth 2 (two) times daily. 60 tablet 3   rosuvastatin (CRESTOR) 20 MG tablet Take 20 mg by mouth daily.      sertraline (ZOLOFT) 25 MG tablet Take 25 mg by mouth at bedtime.     TRELEGY ELLIPTA 200-62.5-25 MCG/ACT AEPB Inhale 1 puff into the lungs daily.     No current facility-administered medications for this visit.    Allergies as of 12/23/2022 - Review Complete 12/17/2022  Allergen Reaction Noted   Sulfa antibiotics  08/02/2013    Family History  Problem Relation Age of Onset   Heart disease Mother    COPD Father    Cancer Sister        unknown primary   Cancer Brother        unknown primary   Cancer Brother    Hypertension Son    Colon cancer Neg Hx     Social History   Socioeconomic History   Marital status: Divorced    Spouse name: Not on file   Number of children: 1   Years of education: Not  on file   Highest education level: Not on file  Occupational History   Occupation: Retired  Tobacco Use   Smoking status: Former    Current packs/day: 0.00    Types: Cigarettes    Quit date: 12/28/2015    Years since quitting: 6.9    Passive exposure: Past   Smokeless tobacco: Never   Tobacco comments:    Patient quit within the last 10 years  Vaping Use   Vaping status: Never Used  Substance and Sexual Activity   Alcohol use: Never   Drug use: Never   Sexual activity: Not Currently  Other Topics Concern   Not on file  Social History Narrative   ** Merged History Encounter **       Social Determinants of Health   Financial Resource Strain: Low Risk  (11/26/2022)   Overall Financial Resource Strain (CARDIA)    Difficulty of Paying Living Expenses: Not hard at all  Food Insecurity: No Food Insecurity (11/26/2022)   Hunger Vital Sign    Worried About Running Out of Food in the Last Year: Never true    Ran Out of Food in the Last Year: Never true  Transportation Needs: No Transportation Needs (09/12/2022)   PRAPARE - Administrator, Civil Service (Medical): No    Lack of Transportation (Non-Medical): No  Physical Activity: Not on file  Stress: No Stress Concern Present (11/26/2022)   [employer] of Occupational Health - Occupational Stress Questionnaire    Feeling of Stress : Not at all  Social Connections: Moderately Integrated (11/26/2022)   Social Connection and Isolation Panel [NHANES]    Frequency of Communication with Friends and Family: Twice a week    Frequency of Social Gatherings with Friends and Family: Twice a week    Attends Religious Services: 1 to 4 times per year    Active Member of [employer] or Organizations: Yes    Attends Banker Meetings: 1 to 4 times per year    Marital Status: Divorced    Review of Systems: Gen: Denies fever, chills, anorexia. Denies fatigue, weakness, weight loss.  CV: Denies chest pain, palpitations,  syncope, peripheral edema, and claudication. Resp: Denies dyspnea at rest, cough, wheezing, coughing up blood, and pleurisy. GI: Denies vomiting blood, jaundice, and fecal incontinence.   Denies dysphagia or odynophagia. Derm: Denies rash, itching, dry skin Psych: Denies depression, anxiety, memory loss, confusion. No homicidal or suicidal ideation.  Heme: Denies bruising, bleeding, and enlarged lymph nodes.  Physical Exam: There were no vitals taken for this visit. General:   Alert and oriented. No distress noted. Pleasant and cooperative.  Head:  Normocephalic and atraumatic. Eyes:  Conjuctiva clear without scleral icterus. Heart:  S1, S2 present without murmurs appreciated. Lungs:  Clear to auscultation bilaterally. No wheezes, rales, or rhonchi. No distress.  Abdomen:  +BS, soft, non-tender and non-distended. No rebound or guarding. No HSM or masses noted. Msk:  Symmetrical without gross deformities. Normal posture. Extremities:  Without edema. Neurologic:  Alert and  oriented x4 Psych:  Normal mood and affect.    Assessment:     Plan:  ***   Ermalinda Memos, PA-C [facility] 12/23/2022

## 2022-12-23 ENCOUNTER — Inpatient Hospital Stay: Payer: 59 | Admitting: Gastroenterology

## 2022-12-23 ENCOUNTER — Encounter: Payer: Self-pay | Admitting: Gastroenterology

## 2022-12-27 ENCOUNTER — Ambulatory Visit: Payer: Self-pay | Admitting: *Deleted

## 2022-12-27 DIAGNOSIS — R809 Proteinuria, unspecified: Secondary | ICD-10-CM | POA: Diagnosis not present

## 2022-12-27 DIAGNOSIS — I7300 Raynaud's syndrome without gangrene: Secondary | ICD-10-CM | POA: Diagnosis not present

## 2022-12-27 DIAGNOSIS — I129 Hypertensive chronic kidney disease with stage 1 through stage 4 chronic kidney disease, or unspecified chronic kidney disease: Secondary | ICD-10-CM | POA: Diagnosis not present

## 2022-12-27 DIAGNOSIS — N2581 Secondary hyperparathyroidism of renal origin: Secondary | ICD-10-CM | POA: Diagnosis not present

## 2022-12-27 DIAGNOSIS — N1832 Chronic kidney disease, stage 3b: Secondary | ICD-10-CM | POA: Diagnosis not present

## 2022-12-27 DIAGNOSIS — D631 Anemia in chronic kidney disease: Secondary | ICD-10-CM | POA: Diagnosis not present

## 2022-12-27 DIAGNOSIS — R768 Other specified abnormal immunological findings in serum: Secondary | ICD-10-CM | POA: Diagnosis not present

## 2022-12-27 NOTE — Patient Outreach (Signed)
Care Coordination   Follow Up Visit Note   12/28/2022 Name: Illana Boyce MRN: [id number] DOB: Jun 10, 1940  Kristal Budz is a 82 y.o. year old female who sees [name], [name], MD for primary care. I spoke with  Festus Barren by phone today.  What matters to the patients health and wellness today?  Confirmed completed labs for nephrology today, Equipment, Hearing aid replacement Today the patient reports she is doing well She denies various concerns with assessment  Equipment She does mention that her present walker that she obtained over 3+ years ago is too large to fit in the door ways of her home. She is interested in a smaller one. Hx of broken wrist, back pain   Appetite/nutrition good has always been slender as noted to have weight 136 lbs BMI of 21.3  Hearing aid - She reports the last one she obtained for in [place]. She states she has checked with the previous [facility] program but it no longer exists She is not able to recall any names of any equipment vendors she has used in the past and confirms she has no preferences  She is having difficulty with hearing well today  RN CM has to speak very loud to converse with her today    Goals Addressed             This Visit's Progress    Manage weight, hearing & mobility needs -RN CM  care coordination       Interventions Today    Flowsheet Row Most Recent Value  Chronic Disease   Chronic disease during today's visit Other  [walker, hearing aid, nutrition]  General Interventions   General Interventions Discussed/Reviewed General Interventions Reviewed, Durable Medical Equipment (DME), Doctor Visits, Communication with, Community Resources  Doctor Visits Discussed/Reviewed Doctor Visits Reviewed, PCP, Specialist  Durable Medical Equipment (DME) Dan Humphreys, Other  [hearing aid]  PCP/Specialist Visits Compliance with follow-up visit  Communication with PCP/Specialists  [email to pcp staff Olegario Messier to request order/referral for new  walker, hearing aid (audiology, DME company)]  Exercise Interventions   Exercise Discussed/Reviewed Exercise Reviewed, Physical Activity  Education Interventions   Education Provided Provided Education  [discussed how walker size is determined by equipment companies, Discussed audiology & process to get a new hearing aid, Discussed not being familiar with a company in [place] county who offers free hearing aids, encouraged maintenance of wt & protein]  Provided Verbal Education On Nutrition, Programmer, applications, Other  Mental Health Interventions   Mental Health Discussed/Reviewed Mental Health Reviewed, Coping Strategies  Nutrition Interventions   Nutrition Discussed/Reviewed Nutrition Reviewed, Increasing proteins, Fluid intake  Pharmacy Interventions   Pharmacy Dicussed/Reviewed Pharmacy Topics Reviewed, Affording Medications  Safety Interventions   Safety Discussed/Reviewed Safety Discussed, Home Safety  Home Safety Assistive Devices  [to prevent falls a new walker that cane fit through the door ways of her home would be best]              SDOH assessments and interventions completed:  No     Care Coordination Interventions:  Yes, provided   Follow up plan: Follow up call scheduled for 01/10/23    Encounter Outcome:  Patient Visit Completed   Cala Bradford L. Noelle Penner, RN, BSN, CCM, Care Management Coordinator [phone]

## 2022-12-27 NOTE — Patient Outreach (Signed)
Care Coordination   12/27/2022 Name: Patricia Jackson MRN: [id number] DOB: [birth year]   Care Coordination Outreach Attempts:  An unsuccessful telephone outreach was attempted for a scheduled appointment today.  Follow Up Plan:  Additional outreach attempts will be made to offer the patient care coordination information and services.   Encounter Outcome:  No Answer   Care Coordination Interventions:  No, not indicated     Amonda Brillhart L. Noelle Penner, RN, BSN, CCM, Care Management Coordinator [phone]

## 2022-12-28 DIAGNOSIS — R410 Disorientation, unspecified: Secondary | ICD-10-CM | POA: Diagnosis not present

## 2022-12-28 DIAGNOSIS — M810 Age-related osteoporosis without current pathological fracture: Secondary | ICD-10-CM | POA: Diagnosis not present

## 2022-12-28 DIAGNOSIS — R0609 Other forms of dyspnea: Secondary | ICD-10-CM | POA: Diagnosis not present

## 2022-12-28 DIAGNOSIS — E871 Hypo-osmolality and hyponatremia: Secondary | ICD-10-CM | POA: Diagnosis not present

## 2022-12-28 NOTE — Patient Instructions (Signed)
Visit Information  Thank you for taking time to visit with me today. Please don't hesitate to contact me if I can be of assistance to you.   Following are the goals we discussed today:   Goals Addressed             This Visit's Progress    Manage weight, hearing & mobility needs -RN CM  care coordination       Interventions Today    Flowsheet Row Most Recent Value  Chronic Disease   Chronic disease during today's visit Other  [walker, hearing aid, nutrition]  General Interventions   General Interventions Discussed/Reviewed General Interventions Reviewed, Durable Medical Equipment (DME), Doctor Visits, Communication with, Community Resources  Doctor Visits Discussed/Reviewed Doctor Visits Reviewed, PCP, Specialist  Durable Medical Equipment (DME) Dan Humphreys, Other  [hearing aid]  PCP/Specialist Visits Compliance with follow-up visit  Communication with PCP/Specialists  [email to pcp staff Olegario Messier to request order/referral for new walker, hearing aid (audiology, DME company)]  Exercise Interventions   Exercise Discussed/Reviewed Exercise Reviewed, Physical Activity  Education Interventions   Education Provided Provided Education  [discussed how walker size is determined by equipment companies, Discussed audiology & process to get a new hearing aid, Discussed not being familiar with a company in [place] who offers free hearing aids, encouraged maintenance of wt & protein]  Provided Verbal Education On Nutrition, Programmer, applications, Other  Mental Health Interventions   Mental Health Discussed/Reviewed Mental Health Reviewed, Coping Strategies  Nutrition Interventions   Nutrition Discussed/Reviewed Nutrition Reviewed, Increasing proteins, Fluid intake  Pharmacy Interventions   Pharmacy Dicussed/Reviewed Pharmacy Topics Reviewed, Affording Medications  Safety Interventions   Safety Discussed/Reviewed Safety Discussed, Home Safety  Home Safety Assistive Devices  [to prevent falls a  new walker that cane fit through the door ways of her home would be best]              Our next appointment is by telephone on 01/10/23 at 3 pm  Please call the care guide team at [phone] if you need to cancel or reschedule your appointment.   If you are experiencing a Mental Health or Behavioral Health Crisis or need someone to talk to, please call the Suicide and Crisis Lifeline: 988 call the [place] National Suicide Prevention Lifeline: 662-779-4466 or TTY: 909-563-3568 TTY 2698528816) to talk to a trained counselor call 1-800-273-TALK (toll free, 24 hour hotline) call the [facility]: [phone] call 911   Patient verbalizes understanding of instructions and care plan provided today and agrees to view in MyChart. Active MyChart status and patient understanding of how to access instructions and care plan via MyChart confirmed with patient.     The patient has been provided with contact information for the care management team and has been advised to call with any health related questions or concerns.   Zamiya Dillard L. Noelle Penner, RN, BSN, CCM, Care Management Coordinator [phone]

## 2023-01-07 ENCOUNTER — Other Ambulatory Visit (HOSPITAL_COMMUNITY): Payer: Self-pay | Admitting: Nephrology

## 2023-01-07 DIAGNOSIS — N1832 Chronic kidney disease, stage 3b: Secondary | ICD-10-CM

## 2023-01-10 ENCOUNTER — Ambulatory Visit: Payer: Self-pay | Admitting: *Deleted

## 2023-01-10 NOTE — Patient Outreach (Signed)
Care Coordination   Follow Up Visit Note   01/10/2023 Name: Patricia Jackson MRN: [id number] DOB: 12-Feb-1941  Lynley Zachman is a 82 y.o. year old female who sees [name], [name], MD for primary care. I spoke with  Festus Barren by phone today.  What matters to the patients health and wellness today?  Walker   Walker a smaller walker not offered yet  She would like one for home safety and fall prevention  Continues to see [facility] ophthalmologist Avastin placed in her right eye. Artificial tears one drop up to (4) times a day for (3) days for comfort is being used in her right eye.   Still has not purchased new hearing aids    Goals Addressed             This Visit's Progress    Manage weight, hearing & mobility needs -RN CM  care coordination       Interventions Today    Flowsheet Row Most Recent Value  Chronic Disease   Chronic disease during today's visit Other  [walker, hearing aid, nutrition]  General Interventions   General Interventions Discussed/Reviewed General Interventions Reviewed, Durable Medical Equipment (DME), Doctor Visits  Doctor Visits Discussed/Reviewed Doctor Visits Reviewed, PCP, Specialist  Education Interventions   Education Provided Provided Education  [hearing aid local providers connected with united healthcare coverage]  Provided Verbal Education On Other, Development worker, community, [facility]  [sent a list of audiologist, places to get an aid, Librarian, academic as a resource to obtain a smaller walker if needed Placed in her discharge information]  Mental Health Interventions   Mental Health Discussed/Reviewed Mental Health Reviewed, Coping Strategies  Nutrition Interventions   Nutrition Discussed/Reviewed Nutrition Reviewed, Increasing proteins, Fluid intake  Safety Interventions   Safety Discussed/Reviewed Safety Discussed, Fall Risk, Home Safety  Home Safety Refer for community resources, Assistive Devices  [e-mail sent to [email or web address] to discuss  patient request of a smaller walker that will help her to get through her doorways at her home She also is needing assist with obtaining new hearing aide A list of [facility] audiologist&audio centers sent]              SDOH assessments and interventions completed:  No     Care Coordination Interventions:  Yes, provided   Follow up plan: Follow up call scheduled for 02/09/23    Encounter Outcome:  Patient Visit Completed

## 2023-01-10 NOTE — Patient Instructions (Addendum)
Visit Information  Thank you for taking time to visit with me today. Please don't hesitate to contact me if I can be of assistance to you.   Here is a resource that can be used to obtain smaller walker  [facility] [employer] [facility] [address] [place], [place] [address] [phone] [email or web address] Provides free to low cost gently used equipment   List of audiologists [email or web address]  [facility] [address] [place], [place] [address]  [facility] [address] [place], [place] [address]  [facility] [address] [place], [place] [address]  [facility] [address] [place], [place] [address]  [facility] [address] [place] [address] [phone]  List of places to get hearing aid  [place] [address] [place], [place] [address] [phone] accepts [facility] [address] [place], [place] [address] [phone] accepts [facility] [address], [address] [place], [place] [address]  [phone]  [facility] [address] [place] [phone]  Following are the goals we discussed today:   Goals Addressed             This Visit's Progress    Manage weight, hearing & mobility needs -RN CM  care coordination       Interventions Today    Flowsheet Row Most Recent Value  Chronic Disease   Chronic disease during today's visit Other  [walker, hearing aid, nutrition]  General Interventions   General Interventions Discussed/Reviewed General Interventions Reviewed, Durable Medical Equipment (DME), Doctor Visits  Doctor Visits Discussed/Reviewed Doctor Visits Reviewed, PCP, Specialist  Education Interventions   Education Provided Provided Education  [hearing aid local providers connected with united healthcare coverage]  Provided Verbal Education On Other,  Development worker, community, [facility]  [sent a list of audiologist, places to get an aid, Librarian, academic as a resource to obtain a smaller walker if needed Placed in her discharge information]  Mental Health Interventions   Mental Health Discussed/Reviewed Mental Health Reviewed, Coping Strategies  Nutrition Interventions   Nutrition Discussed/Reviewed Nutrition Reviewed, Increasing proteins, Fluid intake  Safety Interventions   Safety Discussed/Reviewed Safety Discussed, Fall Risk, Home Safety  Home Safety Refer for community resources, Assistive Devices  [e-mail sent to pcp/kathy to discuss patient request of a smaller walker that will help her to get through her doorways at her home She also is needing assist with obtaining new hearing aide A list of [employer] audiologist&audio centers sent]              Our next appointment is by telephone on 02/09/23 at 3 pm  Please call the care guide team at [phone] if you need to cancel or reschedule your appointment.   If you are experiencing a Mental Health or Behavioral Health Crisis or need someone to talk to, please call the Suicide and Crisis Lifeline: 988 call the [place] National Suicide Prevention Lifeline: 717 605 3848 or TTY: (601)530-2621 TTY 8071838055) to talk to a trained counselor call 1-800-273-TALK (toll free, 24 hour hotline) call the [facility]: ([phone] call 911   Patient verbalizes understanding of instructions and care plan provided today and agrees to view in MyChart. Active MyChart status and patient understanding of how to access instructions and care plan via MyChart confirmed with patient.     The patient has been provided with contact information  for the care management team and has been advised to call with any health related questions or concerns.   Tosh Glaze L. Noelle Penner, RN, BSN, [facility]  [facility] Care Management Coordinator  [phone]  Fax: [phone]

## 2023-01-18 ENCOUNTER — Emergency Department (HOSPITAL_COMMUNITY)
Admission: EM | Admit: 2023-01-18 | Discharge: 2023-01-18 | Disposition: A | Discharge status: Home / Self Care | Payer: 59 | Source: Home / Self Care | Attending: Emergency Medicine | Admitting: Emergency Medicine

## 2023-01-18 ENCOUNTER — Emergency Department (HOSPITAL_COMMUNITY): Payer: 59

## 2023-01-18 ENCOUNTER — Other Ambulatory Visit: Payer: Self-pay

## 2023-01-18 DIAGNOSIS — J40 Bronchitis, not specified as acute or chronic: Secondary | ICD-10-CM | POA: Diagnosis not present

## 2023-01-18 DIAGNOSIS — I4891 Unspecified atrial fibrillation: Secondary | ICD-10-CM | POA: Diagnosis not present

## 2023-01-18 DIAGNOSIS — R0602 Shortness of breath: Secondary | ICD-10-CM | POA: Diagnosis not present

## 2023-01-18 DIAGNOSIS — Z7901 Long term (current) use of anticoagulants: Secondary | ICD-10-CM | POA: Diagnosis not present

## 2023-01-18 DIAGNOSIS — D72829 Elevated white blood cell count, unspecified: Secondary | ICD-10-CM | POA: Insufficient documentation

## 2023-01-18 DIAGNOSIS — R000 Tachycardia, unspecified: Secondary | ICD-10-CM | POA: Diagnosis not present

## 2023-01-18 DIAGNOSIS — I480 Paroxysmal atrial fibrillation: Secondary | ICD-10-CM | POA: Diagnosis not present

## 2023-01-18 DIAGNOSIS — Z955 Presence of coronary angioplasty implant and graft: Secondary | ICD-10-CM | POA: Diagnosis not present

## 2023-01-18 DIAGNOSIS — I129 Hypertensive chronic kidney disease with stage 1 through stage 4 chronic kidney disease, or unspecified chronic kidney disease: Secondary | ICD-10-CM | POA: Diagnosis not present

## 2023-01-18 DIAGNOSIS — Z79899 Other long term (current) drug therapy: Secondary | ICD-10-CM | POA: Insufficient documentation

## 2023-01-18 DIAGNOSIS — J449 Chronic obstructive pulmonary disease, unspecified: Secondary | ICD-10-CM | POA: Insufficient documentation

## 2023-01-18 DIAGNOSIS — Z20822 Contact with and (suspected) exposure to covid-19: Secondary | ICD-10-CM | POA: Insufficient documentation

## 2023-01-18 DIAGNOSIS — J439 Emphysema, unspecified: Secondary | ICD-10-CM | POA: Diagnosis not present

## 2023-01-18 DIAGNOSIS — J441 Chronic obstructive pulmonary disease with (acute) exacerbation: Secondary | ICD-10-CM | POA: Diagnosis not present

## 2023-01-18 DIAGNOSIS — E440 Moderate protein-calorie malnutrition: Secondary | ICD-10-CM | POA: Diagnosis not present

## 2023-01-18 DIAGNOSIS — D696 Thrombocytopenia, unspecified: Secondary | ICD-10-CM | POA: Diagnosis not present

## 2023-01-18 DIAGNOSIS — Z8249 Family history of ischemic heart disease and other diseases of the circulatory system: Secondary | ICD-10-CM | POA: Diagnosis not present

## 2023-01-18 DIAGNOSIS — I10 Essential (primary) hypertension: Secondary | ICD-10-CM | POA: Insufficient documentation

## 2023-01-18 DIAGNOSIS — R918 Other nonspecific abnormal finding of lung field: Secondary | ICD-10-CM | POA: Diagnosis not present

## 2023-01-18 DIAGNOSIS — E876 Hypokalemia: Secondary | ICD-10-CM | POA: Diagnosis not present

## 2023-01-18 DIAGNOSIS — J810 Acute pulmonary edema: Secondary | ICD-10-CM | POA: Diagnosis not present

## 2023-01-18 DIAGNOSIS — J8410 Pulmonary fibrosis, unspecified: Secondary | ICD-10-CM | POA: Diagnosis not present

## 2023-01-18 DIAGNOSIS — I2510 Atherosclerotic heart disease of native coronary artery without angina pectoris: Secondary | ICD-10-CM | POA: Diagnosis not present

## 2023-01-18 DIAGNOSIS — J9601 Acute respiratory failure with hypoxia: Secondary | ICD-10-CM | POA: Diagnosis not present

## 2023-01-18 DIAGNOSIS — Z87891 Personal history of nicotine dependence: Secondary | ICD-10-CM | POA: Diagnosis not present

## 2023-01-18 DIAGNOSIS — E785 Hyperlipidemia, unspecified: Secondary | ICD-10-CM | POA: Diagnosis not present

## 2023-01-18 DIAGNOSIS — J9811 Atelectasis: Secondary | ICD-10-CM | POA: Diagnosis not present

## 2023-01-18 DIAGNOSIS — I700 Atherosclerosis of aorta: Secondary | ICD-10-CM | POA: Diagnosis not present

## 2023-01-18 DIAGNOSIS — Z23 Encounter for immunization: Secondary | ICD-10-CM | POA: Diagnosis not present

## 2023-01-18 DIAGNOSIS — E875 Hyperkalemia: Secondary | ICD-10-CM | POA: Diagnosis not present

## 2023-01-18 DIAGNOSIS — I252 Old myocardial infarction: Secondary | ICD-10-CM | POA: Diagnosis not present

## 2023-01-18 DIAGNOSIS — N1832 Chronic kidney disease, stage 3b: Secondary | ICD-10-CM | POA: Diagnosis not present

## 2023-01-18 DIAGNOSIS — Z1152 Encounter for screening for COVID-19: Secondary | ICD-10-CM | POA: Diagnosis not present

## 2023-01-18 DIAGNOSIS — I4892 Unspecified atrial flutter: Secondary | ICD-10-CM | POA: Diagnosis not present

## 2023-01-18 LAB — COMPREHENSIVE METABOLIC PANEL
ALT: 32 U/L (ref 0–44)
AST: 54 U/L — ABNORMAL HIGH (ref 15–41)
Albumin: 3.6 g/dL (ref 3.5–5.0)
Alkaline Phosphatase: 85 U/L (ref 38–126)
Anion gap: 10 (ref 5–15)
BUN: 26 mg/dL — ABNORMAL HIGH (ref 8–23)
CO2: 18 mmol/L — ABNORMAL LOW (ref 22–32)
Calcium: 9.2 mg/dL (ref 8.9–10.3)
Chloride: 106 mmol/L (ref 98–111)
Creatinine, Ser: 1.58 mg/dL — ABNORMAL HIGH (ref 0.44–1.00)
GFR, Estimated: 33 mL/min — ABNORMAL LOW (ref >60)
Glucose, Bld: 117 mg/dL — ABNORMAL HIGH (ref 70–99)
Potassium: 4 mmol/L (ref 3.5–5.1)
Sodium: 134 mmol/L — ABNORMAL LOW (ref 135–145)
Total Bilirubin: 0.7 mg/dL (ref 0.3–1.2)
Total Protein: 7.2 g/dL (ref 6.5–8.1)

## 2023-01-18 LAB — CBC WITH DIFFERENTIAL/PLATELET
Abs Immature Granulocytes: 0.05 10*3/uL (ref 0.00–0.07)
Basophils Absolute: 0 10*3/uL (ref 0.0–0.1)
Basophils Relative: 0 %
Eosinophils Absolute: 0 10*3/uL (ref 0.0–0.5)
Eosinophils Relative: 0 %
HCT: 35.7 % — ABNORMAL LOW (ref 36.0–46.0)
Hemoglobin: 11.2 g/dL — ABNORMAL LOW (ref 12.0–15.0)
Immature Granulocytes: 0 %
Lymphocytes Relative: 4 %
Lymphs Abs: 0.5 10*3/uL — ABNORMAL LOW (ref 0.7–4.0)
MCH: 28.9 pg (ref 26.0–34.0)
MCHC: 31.4 g/dL (ref 30.0–36.0)
MCV: 92 fL (ref 80.0–100.0)
Monocytes Absolute: 0.8 10*3/uL (ref 0.1–1.0)
Monocytes Relative: 7 %
Neutro Abs: 9.8 10*3/uL — ABNORMAL HIGH (ref 1.7–7.7)
Neutrophils Relative %: 89 %
RBC: 3.88 MIL/uL (ref 3.87–5.11)
RDW: 16.4 % — ABNORMAL HIGH (ref 11.5–15.5)
Smear Review: ADEQUATE
WBC: 11.2 10*3/uL — ABNORMAL HIGH (ref 4.0–10.5)
nRBC: 0 % (ref 0.0–0.2)

## 2023-01-18 LAB — LACTIC ACID, PLASMA: Lactic Acid, Venous: 1.8 mmol/L (ref 0.5–1.9)

## 2023-01-18 LAB — RESP PANEL BY RT-PCR (RSV, FLU A&B, COVID)  RVPGX2
Influenza A by PCR: NEGATIVE
Influenza B by PCR: NEGATIVE
Resp Syncytial Virus by PCR: NEGATIVE
SARS Coronavirus 2 by RT PCR: NEGATIVE

## 2023-01-18 LAB — PROTIME-INR
INR: 1.8 — ABNORMAL HIGH (ref 0.8–1.2)
Prothrombin Time: 21.1 s — ABNORMAL HIGH (ref 11.4–15.2)

## 2023-01-18 MED ORDER — BENZONATATE 100 MG PO CAPS: 100.0000 mg | ORAL_CAPSULE | Freq: Three times a day (TID) | ORAL | 0 refills | Status: DC

## 2023-01-18 MED ORDER — SODIUM CHLORIDE 0.9 % IV SOLN
500.0000 mg | Freq: Once | INTRAVENOUS | Status: AC
  Administered 2023-01-18: 500 mg via INTRAVENOUS
  Filled 2023-01-18: qty 5

## 2023-01-18 MED ORDER — SODIUM CHLORIDE 0.9 % IV SOLN
2.0000 g | Freq: Once | INTRAVENOUS | Status: AC
  Administered 2023-01-18: 2 g via INTRAVENOUS
  Filled 2023-01-18: qty 20

## 2023-01-18 MED ORDER — DOXYCYCLINE HYCLATE 100 MG PO CAPS: 100.0000 mg | ORAL_CAPSULE | Freq: Two times a day (BID) | ORAL | 0 refills | Status: DC

## 2023-01-18 MED ORDER — SODIUM CHLORIDE 0.9 % IV BOLUS
2000.0000 mL | Freq: Once | INTRAVENOUS | Status: AC
  Administered 2023-01-18: 2000 mL via INTRAVENOUS

## 2023-01-18 NOTE — Discharge Instructions (Signed)
Drink plenty of fluids and follow-up with your

## 2023-01-18 NOTE — ED Triage Notes (Signed)
Increased cough over several days Denies fevers   HX of COPD Pt stated she feels like breathing is worse

## 2023-01-18 NOTE — ED Provider Notes (Signed)
[place] EMERGENCY DEPARTMENT AT [facility] Provider Note   CSN: [id number] Arrival date & time: 01/18/23  1132     History {Add pertinent medical, surgical, social history, OB history to HPI:1} Chief Complaint  Patient presents with   Cough   Code Sepsis    Patricia Jackson is a 82 y.o. female.  Patient has a history of hypertension and COPD.  She has had a cough that has been productive for about a week   Cough      Home Medications Prior to Admission medications   Medication Sig Start Date End Date Taking? Authorizing Provider  amLODipine-olmesartan (AZOR) 10-40 MG tablet Take 1 tablet by mouth daily. 01/10/23  Yes [provider]  doxycycline (VIBRAMYCIN) 100 MG capsule Take 1 capsule (100 mg total) by mouth 2 (two) times daily. One po bid x 7 days 01/18/23  Yes Bethann Berkshire, MD  acetaminophen (TYLENOL) 500 MG tablet Take 1,000 mg by mouth every 8 (eight) hours as needed for mild pain or moderate pain. 03/21/17   [provider]  albuterol (PROVENTIL) (2.5 MG/3ML) 0.083% nebulizer solution Take 2.5 mg by nebulization every 6 (six) hours as needed for wheezing or shortness of breath.    [provider]  amlodipine-olmesartan (AZOR) 10-20 MG tablet Take 1 tablet by mouth daily. 12/17/22   Antoine Poche, MD  apixaban (ELIQUIS) 5 MG TABS tablet Take 1 tablet (5 mg total) by mouth 2 (two) times daily. 11/25/22   Lewie Chamber, MD  atropine 1 % ophthalmic solution Place 1 drop into both eyes 3 (three) times daily.    [provider]  buPROPion (WELLBUTRIN XL) 150 MG 24 hr tablet Take 150 mg by mouth daily. 11/18/22   [provider]  Cholecalciferol (VITAMIN D3) 50 MCG (2000 UT) TABS Take 2,000 Units by mouth daily.    [provider]  Copper Gluconate (COPPER CAPS PO) Take 6 mg by mouth daily.    [provider]  denosumab (PROLIA) 60 MG/ML SOSY injection Inject 60 mg into the skin every 6 (six)  months.    [provider]  ferrous sulfate 325 (65 FE) MG EC tablet Take 1 tablet (325 mg total) by mouth daily with breakfast. 11/22/22 11/22/23  Lewie Chamber, MD  levothyroxine (SYNTHROID) 50 MCG tablet Take 50 mcg by mouth daily before breakfast.  09/20/19   [provider]  metoprolol succinate (TOPROL XL) 25 MG 24 hr tablet Take 1 tablet (25 mg total) by mouth daily. 09/02/21   Quintella Reichert, MD  nitroGLYCERIN (NITROSTAT) 0.4 MG SL tablet Place 0.4 mg under the tongue every 5 (five) minutes as needed for chest pain. 06/09/19   [provider]  pantoprazole (PROTONIX) 40 MG tablet Take 1 tablet (40 mg total) by mouth 2 (two) times daily. 11/22/22   Lewie Chamber, MD  prednisoLONE acetate (PRED FORTE) 1 % ophthalmic suspension Place 1 drop into the right eye in the morning and at bedtime. 12/20/22 03/20/23  [provider]  rosuvastatin (CRESTOR) 20 MG tablet Take 20 mg by mouth daily.  02/26/17   [provider]  sertraline (ZOLOFT) 25 MG tablet Take 25 mg by mouth at bedtime.    [provider]  [name] 200-62.5-25 MCG/ACT AEPB Inhale 1 puff into the lungs daily. 11/02/22   [provider]      Allergies    Povidone-iodine, Sulfa antibiotics, and Sulfamethoxazole    Review of Systems   Review of Systems  Respiratory:  Positive for cough.     Physical Exam Updated Vital Signs BP 121/65   Pulse 98   Temp 99.4 F (37.4 C) (Oral)   Resp 16   Ht 5\' 7"  (1.702 m)   Wt 71.2 kg   SpO2 97%   BMI 24.59 kg/m  Physical Exam  ED Results / Procedures / Treatments   Labs (all labs ordered are listed, but only abnormal results are displayed) Labs Reviewed  COMPREHENSIVE METABOLIC PANEL - Abnormal; Notable for the following components:      Result Value   Sodium 134 (*)    CO2 18 (*)    Glucose, Bld 117 (*)    BUN 26 (*)    Creatinine, Ser 1.58 (*)    AST 54 (*)    GFR, Estimated 33 (*)    All other components  within normal limits  CBC WITH DIFFERENTIAL/PLATELET - Abnormal; Notable for the following components:   WBC 11.2 (*)    Hemoglobin 11.2 (*)    HCT 35.7 (*)    RDW 16.4 (*)    Neutro Abs 9.8 (*)    Lymphs Abs 0.5 (*)    All other components within normal limits  PROTIME-INR - Abnormal; Notable for the following components:   Prothrombin Time 21.1 (*)    INR 1.8 (*)    All other components within normal limits  RESP PANEL BY RT-PCR (RSV, FLU A&B, COVID)  RVPGX2  CULTURE, BLOOD (ROUTINE X 2)  CULTURE, BLOOD (ROUTINE X 2)  LACTIC ACID, PLASMA  LACTIC ACID, PLASMA  URINALYSIS, W/ REFLEX TO CULTURE (INFECTION SUSPECTED)    EKG None  Radiology DG Chest Port 1 View  Result Date: 01/18/2023 CLINICAL DATA:  Sepsis cough EXAM: PORTABLE CHEST 1 VIEW COMPARISON:  11/20/2022 FINDINGS: Emphysema and bronchitic changes. No acute airspace disease or effusion. Stable cardiomediastinal silhouette with aortic atherosclerosis. No pneumothorax IMPRESSION: Emphysema and bronchitic changes. Electronically Signed   By: Jasmine Pang M.D.   On: 01/18/2023 15:21    Procedures Procedures  {Document cardiac monitor, telemetry assessment procedure when appropriate:1}  Medications Ordered in ED Medications  sodium chloride 0.9 % bolus 2,000 mL (2,000 mLs Intravenous Bolus 01/18/23 1300)  cefTRIAXone (ROCEPHIN) 2 g in sodium chloride 0.9 % 100 mL IVPB (0 g Intravenous Stopped 01/18/23 1334)  azithromycin (ZITHROMAX) 500 mg in sodium chloride 0.9 % 250 mL IVPB (500 mg Intravenous New Bag/Given 01/18/23 1351)    ED Course/ Medical Decision Making/ A&P   {   Click here for ABCD2, HEART and other calculatorsREFRESH Note before signing :1}                              Medical Decision Making Amount and/or Complexity of Data Reviewed Labs: ordered. Radiology: ordered.  Risk Prescription drug management.   Patient with bronchitis with possible pneumonia.  She started on doxycycline with follow-up  PCP  {Document critical care time when appropriate:1} {Document review of labs and clinical decision tools ie heart score, Chads2Vasc2 etc:1}  {Document your independent review of radiology images, and any outside records:1} {Document your discussion with family members, caretakers, and with consultants:1} {Document social determinants of health affecting pt's care:1} {Document your decision making why or why not admission, treatments were needed:1} Final Clinical Impression(s) / ED Diagnoses Final diagnoses:  Bronchitis    Rx / DC Orders ED Discharge Orders          Ordered  doxycycline (VIBRAMYCIN) 100 MG capsule  2 times daily        01/18/23 1557

## 2023-01-18 NOTE — ED Notes (Signed)
ED Provider at bedside. 

## 2023-01-18 NOTE — Sepsis Progress Note (Signed)
ELink monitoring sepsis protocol

## 2023-01-20 ENCOUNTER — Encounter (HOSPITAL_COMMUNITY): Payer: Self-pay

## 2023-01-20 ENCOUNTER — Inpatient Hospital Stay (HOSPITAL_COMMUNITY)
Admission: EM | Admit: 2023-01-20 | Discharge: 2023-01-27 | DRG: 308 | Disposition: A | Discharge status: Home / Self Care | Payer: 59 | Attending: Family Medicine | Admitting: Family Medicine

## 2023-01-20 ENCOUNTER — Emergency Department (HOSPITAL_COMMUNITY): Payer: 59

## 2023-01-20 ENCOUNTER — Other Ambulatory Visit (HOSPITAL_COMMUNITY): Payer: 59

## 2023-01-20 ENCOUNTER — Other Ambulatory Visit: Payer: Self-pay

## 2023-01-20 DIAGNOSIS — Z955 Presence of coronary angioplasty implant and graft: Secondary | ICD-10-CM

## 2023-01-20 DIAGNOSIS — Z79899 Other long term (current) drug therapy: Secondary | ICD-10-CM

## 2023-01-20 DIAGNOSIS — J8410 Pulmonary fibrosis, unspecified: Secondary | ICD-10-CM | POA: Diagnosis present

## 2023-01-20 DIAGNOSIS — D696 Thrombocytopenia, unspecified: Secondary | ICD-10-CM | POA: Diagnosis present

## 2023-01-20 DIAGNOSIS — Z853 Personal history of malignant neoplasm of breast: Secondary | ICD-10-CM

## 2023-01-20 DIAGNOSIS — N1832 Chronic kidney disease, stage 3b: Secondary | ICD-10-CM | POA: Diagnosis present

## 2023-01-20 DIAGNOSIS — Z7901 Long term (current) use of anticoagulants: Secondary | ICD-10-CM | POA: Diagnosis not present

## 2023-01-20 DIAGNOSIS — Z1152 Encounter for screening for COVID-19: Secondary | ICD-10-CM

## 2023-01-20 DIAGNOSIS — E875 Hyperkalemia: Secondary | ICD-10-CM | POA: Diagnosis not present

## 2023-01-20 DIAGNOSIS — R0602 Shortness of breath: Secondary | ICD-10-CM | POA: Diagnosis not present

## 2023-01-20 DIAGNOSIS — I4892 Unspecified atrial flutter: Secondary | ICD-10-CM | POA: Diagnosis present

## 2023-01-20 DIAGNOSIS — E871 Hypo-osmolality and hyponatremia: Secondary | ICD-10-CM | POA: Diagnosis not present

## 2023-01-20 DIAGNOSIS — J441 Chronic obstructive pulmonary disease with (acute) exacerbation: Secondary | ICD-10-CM | POA: Diagnosis not present

## 2023-01-20 DIAGNOSIS — I480 Paroxysmal atrial fibrillation: Secondary | ICD-10-CM | POA: Diagnosis present

## 2023-01-20 DIAGNOSIS — R410 Disorientation, unspecified: Secondary | ICD-10-CM | POA: Diagnosis not present

## 2023-01-20 DIAGNOSIS — E876 Hypokalemia: Secondary | ICD-10-CM | POA: Diagnosis present

## 2023-01-20 DIAGNOSIS — R000 Tachycardia, unspecified: Secondary | ICD-10-CM | POA: Diagnosis not present

## 2023-01-20 DIAGNOSIS — I252 Old myocardial infarction: Secondary | ICD-10-CM

## 2023-01-20 DIAGNOSIS — D72829 Elevated white blood cell count, unspecified: Secondary | ICD-10-CM | POA: Diagnosis present

## 2023-01-20 DIAGNOSIS — I129 Hypertensive chronic kidney disease with stage 1 through stage 4 chronic kidney disease, or unspecified chronic kidney disease: Secondary | ICD-10-CM | POA: Diagnosis present

## 2023-01-20 DIAGNOSIS — Z8249 Family history of ischemic heart disease and other diseases of the circulatory system: Secondary | ICD-10-CM

## 2023-01-20 DIAGNOSIS — I10 Essential (primary) hypertension: Secondary | ICD-10-CM | POA: Diagnosis not present

## 2023-01-20 DIAGNOSIS — R918 Other nonspecific abnormal finding of lung field: Secondary | ICD-10-CM | POA: Diagnosis not present

## 2023-01-20 DIAGNOSIS — Z8719 Personal history of other diseases of the digestive system: Secondary | ICD-10-CM

## 2023-01-20 DIAGNOSIS — Z79890 Hormone replacement therapy (postmenopausal): Secondary | ICD-10-CM

## 2023-01-20 DIAGNOSIS — I2510 Atherosclerotic heart disease of native coronary artery without angina pectoris: Secondary | ICD-10-CM | POA: Diagnosis present

## 2023-01-20 DIAGNOSIS — M810 Age-related osteoporosis without current pathological fracture: Secondary | ICD-10-CM | POA: Diagnosis not present

## 2023-01-20 DIAGNOSIS — F411 Generalized anxiety disorder: Secondary | ICD-10-CM | POA: Diagnosis present

## 2023-01-20 DIAGNOSIS — R069 Unspecified abnormalities of breathing: Secondary | ICD-10-CM | POA: Diagnosis not present

## 2023-01-20 DIAGNOSIS — N2889 Other specified disorders of kidney and ureter: Secondary | ICD-10-CM | POA: Diagnosis not present

## 2023-01-20 DIAGNOSIS — J810 Acute pulmonary edema: Secondary | ICD-10-CM | POA: Diagnosis present

## 2023-01-20 DIAGNOSIS — Z23 Encounter for immunization: Secondary | ICD-10-CM | POA: Diagnosis not present

## 2023-01-20 DIAGNOSIS — E785 Hyperlipidemia, unspecified: Secondary | ICD-10-CM | POA: Diagnosis present

## 2023-01-20 DIAGNOSIS — J449 Chronic obstructive pulmonary disease, unspecified: Secondary | ICD-10-CM | POA: Diagnosis not present

## 2023-01-20 DIAGNOSIS — Z882 Allergy status to sulfonamides status: Secondary | ICD-10-CM

## 2023-01-20 DIAGNOSIS — I4891 Unspecified atrial fibrillation: Principal | ICD-10-CM | POA: Diagnosis present

## 2023-01-20 DIAGNOSIS — I4949 Other premature depolarization: Secondary | ICD-10-CM | POA: Diagnosis present

## 2023-01-20 DIAGNOSIS — J9811 Atelectasis: Secondary | ICD-10-CM | POA: Diagnosis present

## 2023-01-20 DIAGNOSIS — R0609 Other forms of dyspnea: Secondary | ICD-10-CM | POA: Diagnosis not present

## 2023-01-20 DIAGNOSIS — E440 Moderate protein-calorie malnutrition: Secondary | ICD-10-CM | POA: Insufficient documentation

## 2023-01-20 DIAGNOSIS — N189 Chronic kidney disease, unspecified: Secondary | ICD-10-CM | POA: Diagnosis not present

## 2023-01-20 DIAGNOSIS — Z87891 Personal history of nicotine dependence: Secondary | ICD-10-CM

## 2023-01-20 DIAGNOSIS — Z6821 Body mass index (BMI) 21.0-21.9, adult: Secondary | ICD-10-CM

## 2023-01-20 DIAGNOSIS — R6889 Other general symptoms and signs: Secondary | ICD-10-CM | POA: Diagnosis not present

## 2023-01-20 DIAGNOSIS — J9601 Acute respiratory failure with hypoxia: Secondary | ICD-10-CM | POA: Diagnosis present

## 2023-01-20 DIAGNOSIS — Z825 Family history of asthma and other chronic lower respiratory diseases: Secondary | ICD-10-CM

## 2023-01-20 DIAGNOSIS — I499 Cardiac arrhythmia, unspecified: Secondary | ICD-10-CM | POA: Diagnosis not present

## 2023-01-20 DIAGNOSIS — R54 Age-related physical debility: Secondary | ICD-10-CM | POA: Diagnosis present

## 2023-01-20 DIAGNOSIS — Z91041 Radiographic dye allergy status: Secondary | ICD-10-CM

## 2023-01-20 DIAGNOSIS — Z743 Need for continuous supervision: Secondary | ICD-10-CM | POA: Diagnosis not present

## 2023-01-20 DIAGNOSIS — N281 Cyst of kidney, acquired: Secondary | ICD-10-CM | POA: Diagnosis not present

## 2023-01-20 LAB — CBC
HCT: 34.5 % — ABNORMAL LOW (ref 36.0–46.0)
Hemoglobin: 10.9 g/dL — ABNORMAL LOW (ref 12.0–15.0)
MCH: 29 pg (ref 26.0–34.0)
MCHC: 31.6 g/dL (ref 30.0–36.0)
MCV: 91.8 fL (ref 80.0–100.0)
Platelets: ADEQUATE 10*3/uL (ref 150–400)
RBC: 3.76 MIL/uL — ABNORMAL LOW (ref 3.87–5.11)
RDW: 16.8 % — ABNORMAL HIGH (ref 11.5–15.5)
WBC: 14 10*3/uL — ABNORMAL HIGH (ref 4.0–10.5)
nRBC: 0 % (ref 0.0–0.2)

## 2023-01-20 LAB — BASIC METABOLIC PANEL
Anion gap: 13 (ref 5–15)
BUN: 25 mg/dL — ABNORMAL HIGH (ref 8–23)
CO2: 15 mmol/L — ABNORMAL LOW (ref 22–32)
Calcium: 8.9 mg/dL (ref 8.9–10.3)
Chloride: 111 mmol/L (ref 98–111)
Creatinine, Ser: 1.46 mg/dL — ABNORMAL HIGH (ref 0.44–1.00)
GFR, Estimated: 36 mL/min — ABNORMAL LOW (ref >60)
Glucose, Bld: 135 mg/dL — ABNORMAL HIGH (ref 70–99)
Potassium: 3.4 mmol/L — ABNORMAL LOW (ref 3.5–5.1)
Sodium: 139 mmol/L (ref 135–145)

## 2023-01-20 LAB — TROPONIN I (HIGH SENSITIVITY)
Troponin I (High Sensitivity): 33 ng/L — ABNORMAL HIGH (ref <18)
Troponin I (High Sensitivity): 26 ng/L — ABNORMAL HIGH (ref <18)

## 2023-01-20 LAB — BRAIN NATRIURETIC PEPTIDE: B Natriuretic Peptide: 905 pg/mL — ABNORMAL HIGH (ref 0.0–100.0)

## 2023-01-20 MED ORDER — AMIODARONE HCL IN DEXTROSE 360-4.14 MG/200ML-% IV SOLN
60.0000 mg/h | INTRAVENOUS | Status: DC
  Administered 2023-01-20: 60 mg/h via INTRAVENOUS
  Filled 2023-01-20: qty 200

## 2023-01-20 MED ORDER — ALBUTEROL SULFATE (2.5 MG/3ML) 0.083% IN NEBU
2.5000 mg | INHALATION_SOLUTION | Freq: Four times a day (QID) | RESPIRATORY_TRACT | Status: DC | PRN
  Administered 2023-01-21: 2.5 mg via RESPIRATORY_TRACT
  Filled 2023-01-20: qty 3

## 2023-01-20 MED ORDER — DILTIAZEM HCL-DEXTROSE 125-5 MG/125ML-% IV SOLN (PREMIX)
5.0000 mg/h | INTRAVENOUS | Status: DC
  Administered 2023-01-20: 5 mg/h via INTRAVENOUS
  Filled 2023-01-20: qty 125

## 2023-01-20 MED ORDER — FUROSEMIDE 10 MG/ML IJ SOLN
20.0000 mg | Freq: Once | INTRAMUSCULAR | Status: AC
  Administered 2023-01-20: 20 mg via INTRAVENOUS
  Filled 2023-01-20: qty 2

## 2023-01-20 MED ORDER — DILTIAZEM HCL 25 MG/5ML IV SOLN
10.0000 mg | Freq: Once | INTRAVENOUS | Status: AC
  Administered 2023-01-20: 10 mg via INTRAVENOUS

## 2023-01-20 MED ORDER — UMECLIDINIUM BROMIDE 62.5 MCG/ACT IN AEPB
1.0000 | INHALATION_SPRAY | Freq: Every day | RESPIRATORY_TRACT | Status: DC
  Administered 2023-01-21 – 2023-01-22 (×2): 1 via RESPIRATORY_TRACT
  Filled 2023-01-20: qty 7

## 2023-01-20 MED ORDER — LEVOTHYROXINE SODIUM 50 MCG PO TABS
50.0000 ug | ORAL_TABLET | Freq: Every day | ORAL | Status: DC
  Administered 2023-01-21 – 2023-01-27 (×7): 50 ug via ORAL
  Filled 2023-01-20 (×7): qty 1

## 2023-01-20 MED ORDER — BUPROPION HCL ER (XL) 150 MG PO TB24
150.0000 mg | ORAL_TABLET | Freq: Every day | ORAL | Status: DC
  Administered 2023-01-21 – 2023-01-27 (×7): 150 mg via ORAL
  Filled 2023-01-20 (×7): qty 1

## 2023-01-20 MED ORDER — SERTRALINE HCL 50 MG PO TABS
25.0000 mg | ORAL_TABLET | Freq: Every day | ORAL | Status: DC
  Administered 2023-01-21 – 2023-01-27 (×7): 25 mg via ORAL
  Filled 2023-01-20 (×7): qty 1

## 2023-01-20 MED ORDER — AMIODARONE HCL IN DEXTROSE 360-4.14 MG/200ML-% IV SOLN: 30.0000 mg/h | INTRAVENOUS | Status: DC

## 2023-01-20 MED ORDER — ROSUVASTATIN CALCIUM 20 MG PO TABS
20.0000 mg | ORAL_TABLET | Freq: Every day | ORAL | Status: DC
  Administered 2023-01-21 – 2023-01-27 (×7): 20 mg via ORAL
  Filled 2023-01-20 (×7): qty 1

## 2023-01-20 MED ORDER — AMIODARONE LOAD VIA INFUSION
150.0000 mg | Freq: Once | INTRAVENOUS | Status: AC
  Administered 2023-01-20: 150 mg via INTRAVENOUS
  Filled 2023-01-20: qty 83.34

## 2023-01-20 MED ORDER — ACETAMINOPHEN 500 MG PO TABS
1000.0000 mg | ORAL_TABLET | Freq: Three times a day (TID) | ORAL | Status: DC | PRN
  Administered 2023-01-21 – 2023-01-24 (×3): 1000 mg via ORAL
  Filled 2023-01-20 (×3): qty 2

## 2023-01-20 MED ORDER — ATROPINE SULFATE 1 % OP SOLN
1.0000 [drp] | Freq: Three times a day (TID) | OPHTHALMIC | Status: DC
  Administered 2023-01-20: 1 [drp] via OPHTHALMIC
  Filled 2023-01-20: qty 2
  Filled 2023-01-20: qty 5

## 2023-01-20 MED ORDER — DOXYCYCLINE HYCLATE 100 MG PO TABS
100.0000 mg | ORAL_TABLET | Freq: Two times a day (BID) | ORAL | Status: DC
  Administered 2023-01-20 – 2023-01-26 (×13): 100 mg via ORAL
  Filled 2023-01-20 (×13): qty 1

## 2023-01-20 MED ORDER — BENZONATATE 100 MG PO CAPS
100.0000 mg | ORAL_CAPSULE | Freq: Three times a day (TID) | ORAL | Status: DC
  Administered 2023-01-20 – 2023-01-27 (×19): 100 mg via ORAL
  Filled 2023-01-20 (×19): qty 1

## 2023-01-20 MED ORDER — APIXABAN 5 MG PO TABS
5.0000 mg | ORAL_TABLET | Freq: Two times a day (BID) | ORAL | Status: DC
  Administered 2023-01-20 – 2023-01-27 (×14): 5 mg via ORAL
  Filled 2023-01-20 (×14): qty 1

## 2023-01-20 MED ORDER — METOPROLOL TARTRATE 5 MG/5ML IV SOLN
2.5000 mg | Freq: Once | INTRAVENOUS | Status: AC
  Administered 2023-01-20: 2.5 mg via INTRAVENOUS
  Filled 2023-01-20: qty 5

## 2023-01-20 MED ORDER — FLUTICASONE FUROATE-VILANTEROL 200-25 MCG/ACT IN AEPB
1.0000 | INHALATION_SPRAY | Freq: Every day | RESPIRATORY_TRACT | Status: DC
  Administered 2023-01-21 – 2023-01-22 (×2): 1 via RESPIRATORY_TRACT
  Filled 2023-01-20: qty 28

## 2023-01-20 MED ORDER — DILTIAZEM HCL 25 MG/5ML IV SOLN
10.0000 mg | Freq: Once | INTRAVENOUS | Status: AC
  Administered 2023-01-20: 10 mg via INTRAVENOUS
  Filled 2023-01-20: qty 5

## 2023-01-20 MED ORDER — MIDODRINE HCL 5 MG PO TABS
5.0000 mg | ORAL_TABLET | Freq: Three times a day (TID) | ORAL | Status: DC
  Administered 2023-01-20 – 2023-01-25 (×14): 5 mg via ORAL
  Filled 2023-01-20 (×14): qty 1

## 2023-01-20 MED ORDER — PANTOPRAZOLE SODIUM 40 MG PO TBEC
40.0000 mg | DELAYED_RELEASE_TABLET | Freq: Two times a day (BID) | ORAL | Status: DC
  Administered 2023-01-20 – 2023-01-27 (×14): 40 mg via ORAL
  Filled 2023-01-20 (×14): qty 1

## 2023-01-20 NOTE — Assessment & Plan Note (Addendum)
Heart rates up to 160, improved currently low 100s on exam drip.  Blood pressure systolic 92-1 20s.  Has been compliant with Eliquis.  Echo 09/2022 EF 60 to 65%. -Cardiology consulted, seen by Dr. Jenene Slicker in ED-patient recommendation to use Cardizem, amiodarone if heart rate not controlled. -Patient did not tolerate amiodarone -Cardiology Dr. Wyline Mood consulted-  recommended retrying IV diltiazem, could use midodrine 3 times daily if blood pressure issues overnight, avoid digoxin with renal dysfunction and hypokalemia.  Also try IV Lopressor 2.5 mg as needed if needed.  Oral amiodarone at lower rate of 30 mg/h to see if better tolerated.  N.p.o. midnight consider DCCV in AM.  120 or less resting overnight. - Will start midodrine 5mg  TID -Resume Eliquis

## 2023-01-20 NOTE — ED Notes (Signed)
Dr. Mariea Clonts at the bedside, family updated on inpatient bed status, pt and family understands at this time will remain in the ED probably most the night until there are beds available, pt and family verbalized understanding.   NAD noted, observed even RR, labored, pt remains on her 2L [place], side rails up x2 for safety, plan of care ongoing, pt and family expresses no needs or concerns at this time, call light within reach, pt remains on cardiac monitoring devices and on Cardizem drip at 5mg /hr

## 2023-01-20 NOTE — Assessment & Plan Note (Signed)
Blood pressure soft. -Hold home amlodipine and losartan, and metoprolol

## 2023-01-20 NOTE — ED Notes (Signed)
Dr. Hyacinth Meeker to speak with cardiology regarding medication, medication stopped until further orders

## 2023-01-20 NOTE — ED Notes (Signed)
Cardiology paged via carelink

## 2023-01-20 NOTE — Consult Note (Addendum)
CARDIOLOGY CONSULT NOTE    Patient ID: Patricia Jackson; [id number]; 03/02/1941   Admit date: 01/20/2023 Date of Consult: 01/20/2023  Primary Care Provider: Benita Stabile, MD Primary Cardiologist:  Primary Electrophysiologist:    History of Present Illness:   [name] is a 82 year old F known to have CAD manifested by NSTEMI in 2017 s/p LCx PCI with normal LVEF, paroxysmal atrial flutter, history of GI bleed s/p PRBC transfusions, COPD, Raynaud's syndrome, HTN, carotid artery stenosis, HLD presented to the ER with DOE x 3 days.  Per family, patient was diagnosed with pneumonia around 3 to 4 months ago.  Patient started to have DOE x 3 days, denies having any palpitations or extreme exertion.  She arrived to the ER today, EKG showed A-fib versus atypical flutter with RVR HR 1 50-1 60s.  IV diltiazem push was given that brought the HR down temporarily but she went right back into HR 150s.  Blood pressures are borderline, 110 to 120 mmHg SBP.  She is tachypneic during my interview.  She also has been coughing for the last 3 days, dry cough due to which her chest wall was sore.  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  Currently on Eliquis, last dose this morning.   Past Medical History:  Diagnosis Date   A-fib [facility])    Atrial flutter (HCC)    On Eliquis in 8/17 but discontinued after stent placement   Breast cancer (HCC)    remote   CAD in native artery 03/16/2016   COPD (chronic obstructive pulmonary disease) (HCC)    Dysrhythmia    Hypertension    Iron deficiency  anemia 06/12/2021   NSTEMI (non-ST elevated myocardial infarction) (HCC)    2017   S/P angioplasty with stent 03/15/16 DES Resolute, pLCX 03/16/2016   Vitreous hemorrhage of left eye (HCC) 08/01/2019    Past Surgical History:  Procedure Laterality Date   APPENDECTOMY     BREAST SURGERY Left    CARDIAC CATHETERIZATION N/A 03/15/2016   Procedure: Left Heart Cath and Coronary Angiography;  Surgeon: Lyn Records, MD;  Location: [facility] INVASIVE CV LAB;  Service: Cardiovascular;  Laterality: N/A;   CARDIAC CATHETERIZATION N/A 03/15/2016   Procedure: Coronary Stent Intervention;  Surgeon: Lyn Records, MD;  Location: [facility] INVASIVE CV LAB;  Service: Cardiovascular;  Laterality: N/A;   COLONOSCOPY     remote   COLONOSCOPY N/A 05/26/2016   Procedure: COLONOSCOPY;  Surgeon: Corbin Ade, MD;  Location: [facility] ENDO SUITE;  Service: Endoscopy;  Laterality: N/A;  845   ESOPHAGOGASTRODUODENOSCOPY N/A 05/26/2016   Procedure: ESOPHAGOGASTRODUODENOSCOPY (EGD);  Surgeon: Corbin Ade, MD;  Location: [facility] ENDO SUITE;  Service: Endoscopy;  Laterality: N/A;   ESOPHAGOGASTRODUODENOSCOPY (EGD) WITH PROPOFOL N/A 11/22/2022   Procedure: ESOPHAGOGASTRODUODENOSCOPY (EGD) WITH PROPOFOL;  Surgeon: Lanelle Bal, DO;  Location: [facility] ENDO SUITE;  Service: Endoscopy;  Laterality: N/A;   EYE SURGERY     EYE SURGERY Left    Dr. Barbaraann Barthel   HOT HEMOSTASIS  11/22/2022   Procedure: HOT HEMOSTASIS (ARGON PLASMA COAGULATION/BICAP);  Surgeon: Lanelle Bal, DO;  Location:  [facility] ENDO SUITE;  Service: Endoscopy;;   MALONEY DILATION N/A 05/26/2016   Procedure: Elease Hashimoto DILATION;  Surgeon: Corbin Ade, MD;  Location: [facility] ENDO SUITE;  Service: Endoscopy;  Laterality: N/A;   TOOTH EXTRACTION N/A 01/15/2022   Procedure: DENTAL RESTORATION/EXTRACTIONS;  Surgeon: Ocie Doyne, DMD;  Location: [facility] OR;  Service: Oral Surgery;  Laterality: N/A;   VITRECTOMY Left 10/03/2019   Dr. Luciana Axe, Vitrectomy, Focal Laser, Removal of Silicone Oil      Inpatient Medications: Scheduled Meds:  Continuous Infusions:  diltiazem (CARDIZEM) infusion 5 mg/hr (01/20/23 1640)   PRN Meds:   Allergies:    Allergies  Allergen Reactions   Povidone-Iodine Other (See Comments)    Burning   Sulfa Antibiotics     Unknown reaction    Sulfamethoxazole     Other Reaction(s): Not available    Social History:   Social History   Socioeconomic History   Marital status: Divorced    Spouse name: Not on file   Number of children: 1   Years of education: Not on file   Highest education level: Not on file  Occupational History   Occupation: Retired  Tobacco Use   Smoking status: Former    Current packs/day: 0.00    Types: Cigarettes    Quit date: 12/28/2015    Years since quitting: 7.0    Passive exposure: Past   Smokeless tobacco: Never   Tobacco comments:    Patient quit within the last 10 years  Vaping Use   Vaping status: Never Used  Substance and Sexual Activity   Alcohol use: Never   Drug use: Never   Sexual activity: Not Currently  Other Topics Concern   Not on file  Social History Narrative   ** Merged History Encounter **       Social Determinants of Health   Financial Resource Strain: Low Risk  (11/26/2022)   Overall Financial Resource Strain (CARDIA)    Difficulty of Paying Living Expenses: Not hard at all  Food Insecurity: No Food Insecurity (11/26/2022)   Hunger Vital Sign    Worried About Running Out of Food in the Last Year: Never true    Ran Out of Food in the Last Year: Never true  Transportation Needs: No Transportation Needs (09/12/2022)   PRAPARE - Administrator, Civil Service (Medical): No    Lack of Transportation (Non-Medical): No  Physical Activity: Not on file  Stress: No Stress Concern Present (11/26/2022)   [employer] of Occupational Health - Occupational Stress Questionnaire    Feeling of Stress : Not at all  Social Connections: Moderately Integrated (11/26/2022)   Social  Connection and Isolation Panel [NHANES]    Frequency of Communication with Friends and Family: Twice a week    Frequency of Social Gatherings with Friends and Family: Twice a week    Attends Religious Services: 1 to 4 times per year    Active Member of [employer] or Organizations: Yes    Attends Banker Meetings: 1 to 4 times per year    Marital Status: Divorced  Catering manager Violence: Not At Risk (09/12/2022)   Humiliation, Afraid, Rape, and Kick questionnaire    Fear of Current or Ex-Partner: No    Emotionally Abused: No    Physically Abused: No    Sexually Abused: No    Family History:    Family History  Problem Relation Age of Onset   Heart disease Mother    COPD Father  Cancer Sister        unknown primary   Cancer Brother        unknown primary   Cancer Brother    Hypertension Son    Colon cancer Neg Hx      ROS:  Please see the history of present illness.  ROS  All other ROS reviewed and negative.     Physical Exam/Data:   Vitals:   01/20/23 1500 01/20/23 1504 01/20/23 1558 01/20/23 1600  BP: 112/78 112/78 121/76 121/76  Pulse: (!) 151 (!) 157 (!) 149 (!) 148  Resp: (!) 22 (!) 27 (!) 34 (!) 35  Temp:      TempSrc:      SpO2: 94% 95% 97% 97%  Weight:      Height:       No intake or output data in the 24 hours ending 01/20/23 1652 Filed Weights   01/20/23 1355  Weight: 71.2 kg   Body mass index is 24.58 kg/m.  General:  Well nourished, well developed, in no acute distress HEENT: normal Lymph: no adenopathy Neck: no JVD Endocrine:  No thryomegaly Vascular: No carotid bruits; FA pulses 2+ bilaterally without bruits  Cardiac:  normal S1, S2; RRR; no murmur  Lungs:  clear to auscultation bilaterally, no wheezing, rhonchi or rales  Abd: soft, nontender, no hepatomegaly  Ext: no edema Musculoskeletal:  No deformities, BUE and BLE strength normal and equal Skin: warm and dry  Neuro:  CNs 2-12 intact, no focal abnormalities noted Psych:   Normal affect   EKG:  The EKG was personally reviewed and demonstrates:   Telemetry:  Telemetry was personally reviewed and demonstrates:    Relevant CV Studies:   Laboratory Data:  Chemistry Recent Labs  Lab 01/18/23 1227 01/20/23 1449  NA 134* 139  K 4.0 3.4*  CL 106 111  CO2 18* 15*  GLUCOSE 117* 135*  BUN 26* 25*  CREATININE 1.58* 1.46*  CALCIUM 9.2 8.9  GFRNONAA 33* 36*  ANIONGAP 10 13    Recent Labs  Lab 01/18/23 1227  PROT 7.2  ALBUMIN 3.6  AST 54*  ALT 32  ALKPHOS 85  BILITOT 0.7   Hematology Recent Labs  Lab 01/18/23 1227 01/20/23 1449  WBC 11.2* 14.0*  RBC 3.88 3.76*  HGB 11.2* 10.9*  HCT 35.7* 34.5*  MCV 92.0 91.8  MCH 28.9 29.0  MCHC 31.4 31.6  RDW 16.4* 16.8*  PLT ACLMP PLATELET CLUMPS NOTED ON SMEAR, COUNT APPEARS ADEQUATE   Cardiac EnzymesNo results for input(s): "TROPONINI" in the last 168 hours. No results for input(s): "TROPIPOC" in the last 168 hours.  BNPNo results for input(s): "BNP", "PROBNP" in the last 168 hours.  DDimer No results for input(s): "DDIMER" in the last 168 hours.  Radiology/Studies:  DG Chest Portable 1 View  Result Date: 01/20/2023 CLINICAL DATA:  sob, tachycardia EXAM: PORTABLE CHEST 1 VIEW COMPARISON:  01/18/2023 FINDINGS: Bilateral lungs appear hyperexpanded and hyperlucent with coarse bronchovascular markings, in keeping with COPD. There are increased interstitial markings when compared to the prior exam, suggesting underlying mild pulmonary edema. There are probable atelectasis/scarring at the lung bases. Bilateral lungs otherwise appear clear. No acute consolidation or lung collapse. Bilateral costophrenic angles are clear. Stable cardio-mediastinal silhouette. No acute osseous abnormalities. The soft tissues are within normal limits. IMPRESSION: 1. COPD. 2. Suspected mild superimposed pulmonary edema. Electronically Signed   By: Jules Schick M.D.   On: 01/20/2023 16:27   DG Chest Port 1 View  Result Date:  01/18/2023 CLINICAL DATA:  Sepsis cough EXAM: PORTABLE CHEST 1 VIEW COMPARISON:  11/20/2022 FINDINGS: Emphysema and bronchitic changes. No acute airspace disease or effusion. Stable cardiomediastinal silhouette with aortic atherosclerosis. No pneumothorax IMPRESSION: Emphysema and bronchitic changes. Electronically Signed   By: Jasmine Pang M.D.   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  EKG showed A-fib versus atypical atrial flutter with RVR, HR 1 50-1 60s.  IV diltiazem push transiently brought the heart rates down, started diltiazem drip.  If Bps do not tolerate, start amiodarone drip.  Compliant with Eliquis, last dose of Eliquis was this morning.  Continue Eliquis 5 mg twice daily.  No GI bleed since argon plasma coagulation in August 2024.  If rates are pharmacologic refractory, would benefit from DCCV tomorrow AM.  Keep n.p.o. from midnight.  Administer IV Lasix 20 mg 1 dose and reassess her SOB. -Hold home antihypertensive medications.   For questions or updates, please contact [facility] Please consult [email or web address] for contact info under Cardiology/STEMI.   Signed, Herbert Deaner, MD 01/20/2023 4:52 PM

## 2023-01-20 NOTE — ED Notes (Signed)
Family c/o pain to right arm, family thinks pt allergic to medication d/ty pain, no redness or swelling, no rash or SOB. Dr. Hyacinth Meeker at bedside, medication paused per family request

## 2023-01-20 NOTE — Progress Notes (Addendum)
Patient discussed with ER staff. Presented with afib with RVR. Initially on dilt gtt, SBPs reported low 100s. Tried on amiodarone but reported pain in IV at infusion site. Would retry low dose IV diltiazem, if issues with bp's overnight could transiently use midodrine 5mg  tid. Renal dysfunction and current hypokalemia would avoid digoxin. Could try IV lopressor 2.5mg  prn doses. Could also try amio at lower rate 30mg /hr to see if better tolerated.  Make NPO at midnight, consider DCCV in AM. Rates 120s or less reasonable for overnight, I will see patient in the AM   Dina Rich MD

## 2023-01-20 NOTE — ED Triage Notes (Addendum)
Pt reports she has been feeling short of breath with hx of pneumonia and has also "been sent to a hospital to have something done cause my heart wasn't beating right and I have had a lot of blood transfusions lately".

## 2023-01-20 NOTE — H&P (Signed)
History and Physical    Patricia Jackson ZHY:[id number] DOB: 14-Jan-1941 DOA: 01/20/2023  PCP: Benita Stabile, MD   Patient coming from: Home  I have personally briefly reviewed patient's old medical records in [facility]  Chief Complaint: Difficulty breathing  HPI: Patricia Jackson is a 82 y.o. female with medical history significant for pulmonary fibrosis, COPD, CKD, atrial fibrillation/flutter, GI bleed, hypertension. Patient was brought to the ED with reports of difficulty breathing over the past 3 days, breathing significantly worsened with minimal exertion.  Reports a new cough over the past 4 days that is dry.  No palpitations, no chest pain no lower extremity edema.  She has been compliant with her Eliquis last dose was this morning.  ED Course: Tachycardic to 165, blood pressure systolic 92-122.  O2 sats 94% on room air, placed on 2 L. Chest x-ray shows-suspected mild superimposed pulmonary edema.   Leukocytosis 14. Cardiology was consulted, she was seen by Dr. Jenene Slicker in the ED. recommended Cardizem, and starting amiodarone if heart rate not controlled. Cardizem initially started, then amiodarone was added Patient complained of pain at the IV site hence amiodarone was discontinued.  Dr. Wyline Mood reconsulted, recommended retrying IV diltiazem, could use midodrine 3 times daily if blood pressure issues overnight, avoid digoxin with renal dysfunction and hypokalemia.  Also try IV Lopressor 2.5 mg as needed if needed.  Oral amiodarone at lower rate of 30 mg/h to see if better tolerated.  N.p.o. midnight consider DCCV in AM.  120 or less resting overnight.  Review of Systems: As per HPI all other systems reviewed and negative.  Past Medical History:  Diagnosis Date   A-fib [facility])    Atrial flutter (HCC)    On Eliquis in 8/17 but discontinued after stent placement   Breast cancer (HCC)    remote   CAD in native artery 03/16/2016   COPD (chronic obstructive pulmonary disease)  (HCC)    Dysrhythmia    Hypertension    Iron deficiency anemia 06/12/2021   NSTEMI (non-ST elevated myocardial infarction) (HCC)    2017   S/P angioplasty with stent 03/15/16 DES Resolute, pLCX 03/16/2016   Vitreous hemorrhage of left eye (HCC) 08/01/2019    Past Surgical History:  Procedure Laterality Date   APPENDECTOMY     BREAST SURGERY Left    CARDIAC CATHETERIZATION N/A 03/15/2016   Procedure: Left Heart Cath and Coronary Angiography;  Surgeon: Lyn Records, MD;  Location: [facility] INVASIVE CV LAB;  Service: Cardiovascular;  Laterality: N/A;   CARDIAC CATHETERIZATION N/A 03/15/2016   Procedure: Coronary Stent Intervention;  Surgeon: Lyn Records, MD;  Location: [facility] INVASIVE CV LAB;  Service: Cardiovascular;  Laterality: N/A;   COLONOSCOPY     remote   COLONOSCOPY N/A 05/26/2016   Procedure: COLONOSCOPY;  Surgeon: Corbin Ade, MD;  Location: [facility] ENDO SUITE;  Service: Endoscopy;  Laterality: N/A;  845   ESOPHAGOGASTRODUODENOSCOPY N/A 05/26/2016   Procedure: ESOPHAGOGASTRODUODENOSCOPY (EGD);  Surgeon: Corbin Ade, MD;  Location: [facility] ENDO SUITE;  Service: Endoscopy;  Laterality: N/A;   ESOPHAGOGASTRODUODENOSCOPY (EGD) WITH PROPOFOL N/A 11/22/2022   Procedure: ESOPHAGOGASTRODUODENOSCOPY (EGD) WITH PROPOFOL;  Surgeon: Lanelle Bal, DO;  Location: [facility] ENDO SUITE;  Service: Endoscopy;  Laterality: N/A;   EYE SURGERY     EYE SURGERY Left    Dr. Barbaraann Barthel   HOT HEMOSTASIS  11/22/2022   Procedure: HOT HEMOSTASIS (ARGON PLASMA COAGULATION/BICAP);  Surgeon: Lanelle Bal, DO;  Location: [facility] END[facility] SUITE;  Service: Endoscopy;;  MALONEY DILATION N/A 05/26/2016   Procedure: Elease Hashimoto DILATION;  Surgeon: Corbin Ade, MD;  Location: [facility] END[facility] SUITE;  Service: Endoscopy;  Laterality: N/A;   TOOTH EXTRACTION N/A 01/15/2022   Procedure: DENTAL RESTORATION/EXTRACTIONS;  Surgeon: Ocie Doyne, DMD;  Location: [facility] OR;  Service: Oral Surgery;  Laterality: N/A;   VITRECTOMY Left 10/03/2019   Dr.  Luciana Axe, Vitrectomy, Focal Laser, Removal of Silicone Oil     reports that she quit smoking about 7 years ago. Her smoking use included cigarettes. She has been exposed to tobacco smoke. She has never used smokeless tobacco. She reports that she does not drink alcohol and does not use drugs.  Allergies  Allergen Reactions   Povidone-Iodine Other (See Comments)    Burning   Sulfa Antibiotics     Unknown reaction    Sulfamethoxazole     Other Reaction(s): Not available    Family History  Problem Relation Age of Onset   Heart disease Mother    COPD Father    Cancer Sister        unknown primary   Cancer Brother        unknown primary   Cancer Brother    Hypertension Son    Colon cancer Neg Hx     Prior to Admission medications   Medication Sig Start Date End Date Taking? Authorizing Provider  acetaminophen (TYLENOL) 500 MG tablet Take 1,000 mg by mouth every 8 (eight) hours as needed for mild pain or moderate pain. 03/21/17  Yes [provider]  albuterol (PROVENTIL) (2.5 MG/3ML) 0.083% nebulizer solution Take 2.5 mg by nebulization every 6 (six) hours as needed for wheezing or shortness of breath.   Yes [provider]  amLODipine-olmesartan (AZOR) 10-40 MG tablet Take 1 tablet by mouth daily. 01/10/23  Yes [provider]  apixaban (ELIQUIS) 5 MG TABS tablet Take 1 tablet (5 mg total) by mouth 2 (two) times daily. 11/25/22  Yes Lewie Chamber, MD  atropine 1 % ophthalmic solution Place 1 drop into both eyes 3 (three) times daily.   Yes [provider]  benzonatate (TESSALON) 100 MG capsule Take 1 capsule (100 mg total) by mouth every 8 (eight) hours. 01/18/23  Yes Bethann Berkshire, MD  buPROPion (WELLBUTRIN XL) 150 MG 24 hr tablet Take 150 mg by mouth daily. 11/18/22  Yes [provider]  Cholecalciferol (VITAMIN D3) 50 MCG (2000 UT) TABS Take 2,000 Units by mouth daily.   Yes [provider]  Copper Gluconate (COPPER CAPS PO) Take  6 mg by mouth daily.   Yes [provider]  doxycycline (VIBRAMYCIN) 100 MG capsule Take 1 capsule (100 mg total) by mouth 2 (two) times daily. One po bid x 7 days 01/18/23  Yes Bethann Berkshire, MD  ferrous sulfate 325 (65 FE) MG EC tablet Take 1 tablet (325 mg total) by mouth daily with breakfast. 11/22/22 11/22/23 Yes Lewie Chamber, MD  levothyroxine (SYNTHROID) 50 MCG tablet Take 50 mcg by mouth daily before breakfast.  09/20/19  Yes [provider]  metoprolol succinate (TOPROL XL) 25 MG 24 hr tablet Take 1 tablet (25 mg total) by mouth daily. 09/02/21  Yes Turner, Cornelious Bryant, MD  nitroGLYCERIN (NITROSTAT) 0.4 MG SL tablet Place 0.4 mg under the tongue every 5 (five) minutes as needed for chest pain. 06/09/19  Yes [provider]  pantoprazole (PROTONIX) 40 MG tablet Take 1 tablet (40 mg total) by mouth 2 (two) times daily. 11/22/22  Yes Lewie Chamber, MD  prednisoLONE acetate (PRED FORTE) 1 % ophthalmic suspension Place 1 drop into the right eye in the morning and at bedtime. 12/20/22 03/20/23 Yes [provider]  rosuvastatin (CRESTOR) 20 MG tablet Take 20 mg by mouth daily.  02/26/17  Yes [provider]  sertraline (ZOLOFT) 25 MG tablet Take 25 mg by mouth daily.   Yes [provider]  [name] 200-62.5-25 MCG/ACT AEPB Inhale 1 puff into the lungs daily. 11/02/22  Yes [provider]  denosumab (PROLIA) 60 MG/ML SOSY injection Inject 60 mg into the skin every 6 (six) months. Patient not taking: Reported on 01/20/2023    [provider]    Physical Exam: Vitals:   01/20/23 1915 01/20/23 1945 01/20/23 2000 01/20/23 2015  BP: 100/71 105/66  98/62  Pulse: (!) 143 (!) 119 93 97  Resp: (!) 29 (!) 30 (!) 27 (!) 35  Temp:      TempSrc:      SpO2: 95% 94% 95% 94%  Weight:      Height:        Constitutional: NAD, calm, comfortable Vitals:   01/20/23 1915 01/20/23 1945 01/20/23 2000 01/20/23 2015  BP: 100/71 105/66  98/62   Pulse: (!) 143 (!) 119 93 97  Resp: (!) 29 (!) 30 (!) 27 (!) 35  Temp:      TempSrc:      SpO2: 95% 94% 95% 94%  Weight:      Height:       Eyes: PERRL, lids and conjunctivae normal ENMT: Mucous membranes are moist.  Neck: normal, supple, no masses, no thyromegaly Respiratory: clear to auscultation bilaterally, no wheezing, no crackles. Normal respiratory effort. No accessory muscle use.  Cardiovascular: Regular rate and rhythm, no murmurs / rubs / gallops. No extremity edema. 2+ pedal pulses. No carotid bruits.  Abdomen: no tenderness, no masses palpated. No hepatosplenomegaly. Bowel sounds positive.  Musculoskeletal: no clubbing / cyanosis. No joint deformity upper and lower extremities.  Skin: no rashes, lesions, ulcers. No induration Neurologic: No facial asymmetry, moving extremities spontaneously.  Speech fluent. Psychiatric: Normal judgment and insight. Alert and oriented x 3. Normal mood.   Labs on Admission: I have personally reviewed following labs and imaging studies  CBC: Recent Labs  Lab 01/18/23 1227 01/20/23 1449  WBC 11.2* 14.0*  NEUTROABS 9.8*  --   HGB 11.2* 10.9*  HCT 35.7* 34.5*  MCV 92.0 91.8  PLT ACLMP PLATELET CLUMPS NOTED ON SMEAR, COUNT APPEARS ADEQUATE   Basic Metabolic Panel: Recent Labs  Lab 01/18/23 1227 01/20/23 1449  NA 134* 139  K 4.0 3.4*  CL 106 111  CO2 18* 15*  GLUCOSE 117* 135*  BUN 26* 25*  CREATININE 1.58* 1.46*  CALCIUM 9.2 8.9   GFR: Estimated Creatinine Clearance: 30.4 mL/min (A) (by C-G formula based on SCr of 1.46 mg/dL (H)). Liver Function Tests: Recent Labs  Lab 01/18/23 1227  AST 54*  ALT 32  ALKPHOS 85  BILITOT 0.7  PROT 7.2  ALBUMIN 3.6    Coagulation Profile: Recent Labs  Lab 01/18/23 1227  INR 1.8*   Radiological Exams on Admission: DG Chest Portable 1 View  Result Date: 01/20/2023 CLINICAL DATA:  sob, tachycardia EXAM: PORTABLE CHEST 1 VIEW COMPARISON:  01/18/2023 FINDINGS: Bilateral lungs  appear hyperexpanded and hyperlucent with coarse bronchovascular markings, in keeping with COPD. There are increased interstitial markings when compared to the prior exam, suggesting underlying mild pulmonary edema. There are probable atelectasis/scarring at the lung bases. Bilateral lungs  otherwise appear clear. No acute consolidation or lung collapse. Bilateral costophrenic angles are clear. Stable cardio-mediastinal silhouette. No acute osseous abnormalities. The soft tissues are within normal limits. IMPRESSION: 1. COPD. 2. Suspected mild superimposed pulmonary edema. Electronically Signed   By: Jules Schick M.D.   On: 01/20/2023 16:27    EKG: Independently reviewed.  Atrial fibrillation rate 157.  QTc 419.  Assessment/Plan Principal Problem:   Atrial fibrillation with RVR (HCC) Active Problems:   Hypertension   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HCC)   Chronic obstructive pulmonary disease (HCC)   Generalized anxiety disorder  Assessment and Plan: * Atrial fibrillation with RVR (HCC) Heart rates up to 160, improved currently low 100s on exam drip.  Blood pressure systolic 92-1 20s.  Has been compliant with Eliquis.  Echo 09/2022 EF 60 to 65%. -Cardiology consulted, seen by Dr. Jenene Slicker in ED-patient recommendation to use Cardizem, amiodarone if heart rate not controlled. -Patient did not tolerate amiodarone -Cardiology Dr. Wyline Mood consulted-  recommended retrying IV diltiazem, could use midodrine 3 times daily if blood pressure issues overnight, avoid digoxin with renal dysfunction and hypokalemia.  Also try IV Lopressor 2.5 mg as needed if needed.  Oral amiodarone at lower rate of 30 mg/h to see if better tolerated.  N.p.o. midnight consider DCCV in AM.  120 or less resting overnight. - Will start midodrine 5mg  TID -Resume Eliquis  Generalized anxiety disorder Resume sertraline bupropion  Chronic obstructive pulmonary disease (HCC) History of COPD and pulmonary fibrosis.  Not on home O2.  Chest  x-ray suggest pulmonary edema.  Reports new dry cough over the past 4 days.  Was in ED for same- 2 days ago and was started on doxycycline -Continue doxycycline - Resume home BD regimen  Hypertension Blood pressure soft. -Hold home amlodipine and losartan, and metoprolol    DVT prophylaxis: Eliquis Code Status:  FULL code Family Communication: None at bedside Disposition Plan: ~ 2 days Consults called: Cardiology Admission status: Inpt Stepdown I certify that at the point of admission it is my clinical judgment that the patient will require inpatient hospital care spanning beyond 2 midnights from the point of admission due to high intensity of service, high risk for further deterioration and high frequency of surveillance required.  Author: Onnie Boer, MD 01/20/2023 10:42 PM  For on call review [email or web address].

## 2023-01-20 NOTE — ED Notes (Signed)
Per Dr. Hyacinth Meeker, hold amiodarone drip, restart Cardizem drip and keep at 5mg /hr, 2.5 mg IV push metoprolol administer as well.

## 2023-01-20 NOTE — Assessment & Plan Note (Signed)
Resume sertraline bupropion

## 2023-01-20 NOTE — Assessment & Plan Note (Signed)
History of COPD and pulmonary fibrosis.  Not on home O2.  Chest x-ray suggest pulmonary edema.  Reports new dry cough over the past 4 days.  Was in ED for same- 2 days ago and was started on doxycycline -Continue doxycycline - Resume home BD regimen

## 2023-01-20 NOTE — ED Provider Notes (Signed)
[place] EMERGENCY DEPARTMENT AT [facility] Provider Note   CSN: [id number] Arrival date & time: 01/20/23  1323     History  Chief Complaint  Patient presents with   Shortness of Breath    Patricia Jackson is a 82 y.o. female presenting for evaluation of a 2 to 3-day history of shortness of breath with exertion.  She states she was diagnosed last month with a bronchitis here and had significant shortness of breath with that illness.  She thought she was feeling better until the past 3 days when she became short of breath again.  She is symptom-free at rest but any ambulation or significant exertion makes her winded.  She denies fevers or chills, she has not been coughing, denies nausea or vomiting, abdominal pain, denies chest pain.  She states she can only walk to her bathroom which is just several paces from her bedroom without significant symptoms.  She has a history of A-fib, she does not appreciate palpitations.  With her medications including her Eliquis, but did have to hold her Eliquis in August when she was found to have a bleeding AVM during an admission here.  The history is provided by the patient and a relative.       Home Medications Prior to Admission medications   Medication Sig Start Date End Date Taking? Authorizing Provider  acetaminophen (TYLENOL) 500 MG tablet Take 1,000 mg by mouth every 8 (eight) hours as needed for mild pain or moderate pain. 03/21/17  Yes [provider]  albuterol (PROVENTIL) (2.5 MG/3ML) 0.083% nebulizer solution Take 2.5 mg by nebulization every 6 (six) hours as needed for wheezing or shortness of breath.   Yes [provider]  amLODipine-olmesartan (AZOR) 10-40 MG tablet Take 1 tablet by mouth daily. 01/10/23  Yes [provider]  apixaban (ELIQUIS) 5 MG TABS tablet Take 1 tablet (5 mg total) by mouth 2 (two) times daily. 11/25/22  Yes Lewie Chamber, MD  atropine 1 % ophthalmic solution Place 1 drop into  both eyes 3 (three) times daily.   Yes [provider]  benzonatate (TESSALON) 100 MG capsule Take 1 capsule (100 mg total) by mouth every 8 (eight) hours. 01/18/23  Yes Bethann Berkshire, MD  buPROPion (WELLBUTRIN XL) 150 MG 24 hr tablet Take 150 mg by mouth daily. 11/18/22  Yes [provider]  Cholecalciferol (VITAMIN D3) 50 MCG (2000 UT) TABS Take 2,000 Units by mouth daily.   Yes [provider]  Copper Gluconate (COPPER CAPS PO) Take 6 mg by mouth daily.   Yes [provider]  doxycycline (VIBRAMYCIN) 100 MG capsule Take 1 capsule (100 mg total) by mouth 2 (two) times daily. One po bid x 7 days 01/18/23  Yes Bethann Berkshire, MD  ferrous sulfate 325 (65 FE) MG EC tablet Take 1 tablet (325 mg total) by mouth daily with breakfast. 11/22/22 11/22/23 Yes Lewie Chamber, MD  levothyroxine (SYNTHROID) 50 MCG tablet Take 50 mcg by mouth daily before breakfast.  09/20/19  Yes [provider]  metoprolol succinate (TOPROL XL) 25 MG 24 hr tablet Take 1 tablet (25 mg total) by mouth daily. 09/02/21  Yes Turner, Cornelious Bryant, MD  nitroGLYCERIN (NITROSTAT) 0.4 MG SL tablet Place 0.4 mg under the tongue every 5 (five) minutes as needed for chest pain. 06/09/19  Yes [provider]  pantoprazole (PROTONIX) 40 MG tablet Take 1 tablet (40 mg total) by mouth 2 (two) times daily. 11/22/22  Yes Lewie Chamber, MD  prednisoLONE acetate (PRED FORTE) 1 % ophthalmic suspension Place 1 drop into the right eye in the morning and at bedtime. 12/20/22 03/20/23 Yes [provider]  rosuvastatin (CRESTOR) 20 MG tablet Take 20 mg by mouth daily.  02/26/17  Yes [provider]  sertraline (ZOLOFT) 25 MG tablet Take 25 mg by mouth daily.   Yes [provider]  [name] 200-62.5-25 MCG/ACT AEPB Inhale 1 puff into the lungs daily. 11/02/22  Yes [provider]  denosumab (PROLIA) 60 MG/ML SOSY injection Inject 60 mg into the skin every 6 (six)  months. Patient not taking: Reported on 01/20/2023    [provider]      Allergies    Povidone-iodine, Sulfa antibiotics, and Sulfamethoxazole    Review of Systems   Review of Systems  Constitutional:  Positive for fatigue. Negative for fever.  HENT:  Negative for congestion.   Eyes: Negative.   Respiratory:  Positive for cough. Negative for chest tightness and shortness of breath.   Cardiovascular:  Negative for chest pain, palpitations and leg swelling.  Gastrointestinal:  Negative for abdominal pain and nausea.  Genitourinary: Negative.   Musculoskeletal:  Negative for arthralgias, joint swelling and neck pain.  Skin: Negative.  Negative for rash and wound.  Neurological:  Negative for dizziness, weakness, light-headedness, numbness and headaches.  Psychiatric/Behavioral: Negative.      Physical Exam Updated Vital Signs BP 113/70 (BP Location: Left Arm)   Pulse (!) 151   Temp 98.7 F (37.1 C) (Oral)   Resp (!) 21   Ht 5\' 7"  (1.702 m)   Wt 71.2 kg   SpO2 97%   BMI 24.58 kg/m  Physical Exam Vitals and nursing note reviewed.  Constitutional:      Appearance: She is well-developed.  HENT:     Head: Normocephalic and atraumatic.  Eyes:     Conjunctiva/sclera: Conjunctivae normal.  Cardiovascular:     Rate and Rhythm: Tachycardia present. Rhythm irregular.     Heart sounds: No murmur heard. Pulmonary:     Effort: Pulmonary effort is normal. Tachypnea present.     Breath sounds: Normal breath sounds. No decreased breath sounds, wheezing or rhonchi.  Chest:     Chest wall: No tenderness.  Abdominal:     General: Bowel sounds are normal.     Palpations: Abdomen is soft.     Tenderness: There is no abdominal tenderness.  Musculoskeletal:        General: Normal range of motion.     Cervical back: Normal range of motion.  Skin:    General: Skin is warm and dry.  Neurological:     Mental Status: She is alert.     ED Results / Procedures / Treatments    Labs (all labs ordered are listed, but only abnormal results are displayed) Labs Reviewed  CBC - Abnormal; Notable for the following components:      Result Value   WBC 14.0 (*)    RBC 3.76 (*)    Hemoglobin 10.9 (*)    HCT 34.5 (*)    RDW 16.8 (*)    All other components within normal limits  BASIC METABOLIC PANEL - Abnormal; Notable for the following components:   Potassium 3.4 (*)    CO2 15 (*)    Glucose, Bld 135 (*)    BUN 25 (*)    Creatinine, Ser 1.46 (*)    GFR, Estimated 36 (*)    All other components within normal limits  TROPONIN  I (HIGH SENSITIVITY) - Abnormal; Notable for the following components:   Troponin I (High Sensitivity) 33 (*)    All other components within normal limits  TROPONIN I (HIGH SENSITIVITY) - Abnormal; Notable for the following components:   Troponin I (High Sensitivity) 26 (*)    All other components within normal limits    EKG EKG Interpretation Date/Time:  Thursday January 20 2023 13:50:04 EDT Ventricular Rate:  157 PR Interval:    QRS Duration:  86 QT Interval:  259 QTC Calculation: 419 R Axis:   79  Text Interpretation: Atrial fibrillation with rapid V-rate Repolarization abnormality, prob rate related Confirmed by Estanislado Pandy [phone]) on 01/20/2023 3:07:22 PM  Radiology DG Chest Portable 1 View  Result Date: 01/20/2023 CLINICAL DATA:  sob, tachycardia EXAM: PORTABLE CHEST 1 VIEW COMPARISON:  01/18/2023 FINDINGS: Bilateral lungs appear hyperexpanded and hyperlucent with coarse bronchovascular markings, in keeping with COPD. There are increased interstitial markings when compared to the prior exam, suggesting underlying mild pulmonary edema. There are probable atelectasis/scarring at the lung bases. Bilateral lungs otherwise appear clear. No acute consolidation or lung collapse. Bilateral costophrenic angles are clear. Stable cardio-mediastinal silhouette. No acute osseous abnormalities. The soft tissues are within normal limits.  IMPRESSION: 1. COPD. 2. Suspected mild superimposed pulmonary edema. Electronically Signed   By: Jules Schick M.D.   On: 01/20/2023 16:27    Procedures Procedures    Medications Ordered in ED Medications  amiodarone (NEXTERONE) 1.8 mg/mL load via infusion 150 mg (150 mg Intravenous Bolus from Bag 01/20/23 1902)    Followed by  amiodarone (NEXTERONE PREMIX) 360-4.14 MG/200ML-% (1.8 mg/mL) IV infusion (60 mg/hr Intravenous New Bag/Given 01/20/23 1911)    Followed by  amiodarone (NEXTERONE PREMIX) 360-4.14 MG/200ML-% (1.8 mg/mL) IV infusion (has no administration in time range)  diltiazem (CARDIZEM) injection 10 mg (10 mg Intravenous Given 01/20/23 1451)  diltiazem (CARDIZEM) injection 10 mg (10 mg Intravenous Given 01/20/23 1459)  furosemide (LASIX) injection 20 mg (20 mg Intravenous Given 01/20/23 1632)    ED Course/ Medical Decision Making/ A&P                                 Medical Decision Making Patient presenting for evaluation of shortness of breath, no chest pain, patient does not recognize symptoms of tachycardia when she is in A-fib.  She has been compliant with her medications including her Eliquis recently but had held secondary to a GI bleed in August.  She is in acute A-fib today, she denies chest pain, her chief complaint is shortness of breath with exertion only.  He had a recent acute bronchitis diagnosed here last month.  She had been coughing, this has significantly improved.  Denies fevers.  Denies peripheral edema.  Amount and/or Complexity of Data Reviewed Labs: ordered.    Details: She does have a WBC count of 14.0, hemoglobin of 10.9 which is stable,, her BUN is 25, creatinine 1.46, her initial troponin is 33, pending delta troponin at this time, this may represent demand ischemia.. Radiology: ordered.    Details: Pending chest x-ray reading.  Possible mild fluid overload, haziness right base. ECG/medicine tests: ordered and independent interpretation  performed.    Details: A-fib with RVR, rate 157 Discussion of management or test interpretation with external provider(s): Patient discussed with Dr. Jenene Slicker who consulted patient, after given 20 of diltiazem, she only briefly responded with a drop in her rate to mid 20s.  She has recommended a diltiazem drip as long as her blood pressure tolerates, if this does not get her rate controlled could add amiodarone.  She also recommended a one-time dose of Lasix 20 mg, suspect she may be slightly fluid overloaded secondary to the arrhythmia.  She will follow patient, recommending a hospitalist admission.  She would not recommend cardioversion at this time given her recent trial off of Eliquis secondary to prior GI bleed.  Call placed to hospitalist. Discussed with Dr. Mariea Clonts.  Pt still with significant tachycardia, afib pattern but also with soft blood pressure at this time.  She requests call back once she responds to the amiodarone,  diltiazem being weaned off since this has not been effective.    Discussed with Dr. Hyacinth Meeker who assumes pt care.   Risk Decision regarding hospitalization.           Final Clinical Impression(s) / ED Diagnoses Final diagnoses:  Atrial fibrillation with RVR [facility])    Rx / DC Orders ED Discharge Orders     None         Victoriano Lain 01/20/23 1925    Coral Spikes, DO 01/28/23 1517

## 2023-01-20 NOTE — ED Notes (Signed)
Pt provided water per RN approval

## 2023-01-21 ENCOUNTER — Ambulatory Visit (HOSPITAL_COMMUNITY): Admission: RE | Admit: 2023-01-21 | Payer: 59 | Source: Ambulatory Visit

## 2023-01-21 ENCOUNTER — Encounter (HOSPITAL_COMMUNITY): Payer: Self-pay

## 2023-01-21 DIAGNOSIS — I4892 Unspecified atrial flutter: Secondary | ICD-10-CM | POA: Diagnosis not present

## 2023-01-21 DIAGNOSIS — I4891 Unspecified atrial fibrillation: Secondary | ICD-10-CM | POA: Diagnosis not present

## 2023-01-21 LAB — BASIC METABOLIC PANEL
Anion gap: 10 (ref 5–15)
BUN: 28 mg/dL — ABNORMAL HIGH (ref 8–23)
CO2: 17 mmol/L — ABNORMAL LOW (ref 22–32)
Calcium: 8.2 mg/dL — ABNORMAL LOW (ref 8.9–10.3)
Chloride: 109 mmol/L (ref 98–111)
Creatinine, Ser: 1.63 mg/dL — ABNORMAL HIGH (ref 0.44–1.00)
GFR, Estimated: 32 mL/min — ABNORMAL LOW (ref >60)
Glucose, Bld: 131 mg/dL — ABNORMAL HIGH (ref 70–99)
Potassium: 3.6 mmol/L (ref 3.5–5.1)
Sodium: 136 mmol/L (ref 135–145)

## 2023-01-21 LAB — CBC
HCT: 28.6 % — ABNORMAL LOW (ref 36.0–46.0)
Hemoglobin: 9.1 g/dL — ABNORMAL LOW (ref 12.0–15.0)
MCH: 28.9 pg (ref 26.0–34.0)
MCHC: 31.8 g/dL (ref 30.0–36.0)
MCV: 90.8 fL (ref 80.0–100.0)
Platelets: 123 10*3/uL — ABNORMAL LOW (ref 150–400)
RBC: 3.15 MIL/uL — ABNORMAL LOW (ref 3.87–5.11)
RDW: 16.9 % — ABNORMAL HIGH (ref 11.5–15.5)
WBC: 11.2 10*3/uL — ABNORMAL HIGH (ref 4.0–10.5)
nRBC: 0 % (ref 0.0–0.2)

## 2023-01-21 MED ORDER — POLYETHYLENE GLYCOL 3350 17 G PO PACK: 17.0000 g | PACK | Freq: Every day | ORAL | Status: DC | PRN

## 2023-01-21 MED ORDER — INFLUENZA VAC A&B SURF ANT ADJ 0.5 ML IM SUSY
0.5000 mL | PREFILLED_SYRINGE | INTRAMUSCULAR | Status: AC
  Administered 2023-01-23: 0.5 mL via INTRAMUSCULAR
  Filled 2023-01-21: qty 0.5

## 2023-01-21 MED ORDER — DILTIAZEM HCL 30 MG PO TABS
30.0000 mg | ORAL_TABLET | Freq: Four times a day (QID) | ORAL | Status: DC
  Administered 2023-01-21 (×2): 30 mg via ORAL
  Filled 2023-01-21 (×3): qty 1

## 2023-01-21 MED ORDER — ONDANSETRON HCL 4 MG/2ML IJ SOLN
4.0000 mg | Freq: Four times a day (QID) | INTRAMUSCULAR | Status: DC | PRN
  Administered 2023-01-21 – 2023-01-24 (×2): 4 mg via INTRAVENOUS
  Filled 2023-01-21 (×2): qty 2

## 2023-01-21 MED ORDER — POLYETHYLENE GLYCOL 3350 17 G PO PACK
17.0000 g | PACK | Freq: Every day | ORAL | Status: DC
  Administered 2023-01-21 – 2023-01-26 (×6): 17 g via ORAL
  Filled 2023-01-21 (×6): qty 1

## 2023-01-21 MED ORDER — ONDANSETRON HCL 4 MG PO TABS: 4.0000 mg | ORAL_TABLET | Freq: Four times a day (QID) | ORAL | Status: DC | PRN

## 2023-01-21 MED ORDER — ETOMIDATE 2 MG/ML IV SOLN
10.0000 mg | Freq: Once | INTRAVENOUS | Status: AC
  Administered 2023-01-21: 10 mg via INTRAVENOUS
  Filled 2023-01-21: qty 10

## 2023-01-21 MED ORDER — FUROSEMIDE 10 MG/ML IJ SOLN
20.0000 mg | Freq: Once | INTRAMUSCULAR | Status: AC
  Administered 2023-01-21: 20 mg via INTRAVENOUS
  Filled 2023-01-21: qty 2

## 2023-01-21 MED ORDER — ACETAMINOPHEN 325 MG PO TABS
650.0000 mg | ORAL_TABLET | Freq: Four times a day (QID) | ORAL | Status: DC | PRN
  Administered 2023-01-21 – 2023-01-23 (×2): 650 mg via ORAL
  Filled 2023-01-21 (×2): qty 2

## 2023-01-21 MED ORDER — ATROPINE SULFATE 1 % OP SOLN
1.0000 [drp] | Freq: Two times a day (BID) | OPHTHALMIC | Status: DC
  Administered 2023-01-21 – 2023-01-27 (×13): 1 [drp] via OPHTHALMIC
  Filled 2023-01-21: qty 2

## 2023-01-21 MED ORDER — ENSURE ENLIVE PO LIQD
237.0000 mL | Freq: Two times a day (BID) | ORAL | Status: DC
  Administered 2023-01-22 – 2023-01-27 (×9): 237 mL via ORAL

## 2023-01-21 MED ORDER — PREDNISOLONE ACETATE 1 % OP SUSP
1.0000 [drp] | Freq: Two times a day (BID) | OPHTHALMIC | Status: DC
  Administered 2023-01-21 – 2023-01-27 (×13): 1 [drp] via OPHTHALMIC
  Filled 2023-01-21: qty 1

## 2023-01-21 MED ORDER — ACETAMINOPHEN 650 MG RE SUPP: 650.0000 mg | Freq: Four times a day (QID) | RECTAL | Status: DC | PRN

## 2023-01-21 MED ORDER — ATROPINE SULFATE 1 % OP SOLN
1.0000 [drp] | Freq: Two times a day (BID) | OPHTHALMIC | Status: DC
  Filled 2023-01-21: qty 2

## 2023-01-21 NOTE — Plan of Care (Signed)
[facility] Tonsillectomy/Adenoidectomy, Postoperative PEDS care plan entered in error.

## 2023-01-21 NOTE — ED Notes (Signed)
Pt appears to be comfortable and resting with eyes closed, can observe even RR, chest rise and fall symmetrical, pt remains on cardiac monitoring devices, Afib with rhythm 110-140, pt remains on Cardizem drip at 5mg /hr, no change in order to titrate drip, pt turned off TV to rest, side rails up x2 for safety, NAD noted, call light within reach laying on pt's chest, no further concerns as of present.

## 2023-01-21 NOTE — ED Notes (Signed)
Per Dr. Earle Gell, according to the [facility]. Keep pt on Cardizem drip at 5mg /hr, do not change to amiodarone drip, continue to hold amiodarone. Patient has received her dose of PO midodrine 5 mg.

## 2023-01-21 NOTE — ED Provider Notes (Addendum)
Contacted by cardiology Dr. Wyline Mood.  Patient atrial fib with RVR not well-controlled with diltiazem a.m. or additional amiodarone.  He is recommending cardioversion.  Not able to get that arranged in the PACU.  In theory if patient converts he thinks that patient may be can be discharged home.  Currently she does have an oxygen requirement but that could be brought on by the fact of the atrial fit with RVR.  We will plan to cardiovert her.  And he will reevaluate her later this afternoon.   Vanetta Mulders, MD 01/21/23 1108   Plan will be synchronized cardioversion will start 150 J and then go to 200 J if necessary.  Patient has never been cardioverted before.  Patient verbally consented explained they will use etomidate for sedation.  Possible risks explained.  See cardioversion note.   Vanetta Mulders, MD 01/21/23 1117  .Cardioversion  Date/Time: 01/21/2023 12:20 PM  Performed by: Vanetta Mulders, MD Authorized by: Vanetta Mulders, MD   Consent:    Consent obtained:  Verbal   Consent given by:  Patient   Risks discussed:  Cutaneous burn, induced arrhythmia, pain and death   Alternatives discussed:  Rate-control medication Pre-procedure details:    Cardioversion basis:  Elective   Rhythm:  Atrial fibrillation   Electrode placement:  Anterior-posterior Patient sedated: Yes. Refer to sedation procedure documentation for details of sedation.  Attempt one:    Cardioversion mode:  Synchronous   Waveform:  Biphasic   Shock (Joules):  150   Shock outcome:  Conversion to normal sinus rhythm Post-procedure details:    Patient status:  Awake   Patient tolerance of procedure:  Tolerated well, no immediate complications Comments:     Patient was already on 3 L of oxygen.  We did bump up her oxygen to 4 L to do the procedure.  They had her sats in the low 90s.  During the procedure after receiving 10 mg of etomidate for sedation she did drop her sats so she was briefly put on 100%  nonrebreather.  The cardioversion was successful in converting her to sinus rhythm.  But occasionally she would have some short runs of atrial fibrillation but heart rate never got up into an RVR pattern.   Will let Dr. Wyline Mood cardiology know the procedure is completed.     Vanetta Mulders, MD 01/21/23 [phone]

## 2023-01-21 NOTE — Progress Notes (Signed)
Rounding Note    Patient Name: Isis Zisk Date of Encounter: 01/21/2023  [facility] Cardiologist: Dina Rich, MD   Subjective   On going SOB  Inpatient Medications    Scheduled Meds:  apixaban  5 mg Oral BID   atropine  1 drop Both Eyes TID   benzonatate  100 mg Oral Q8H   buPROPion  150 mg Oral Daily   doxycycline  100 mg Oral BID   fluticasone furoate-vilanterol  1 puff Inhalation Daily   And   umeclidinium bromide  1 puff Inhalation Daily   levothyroxine  50 mcg Oral Q0600   midodrine  5 mg Oral TID WC   pantoprazole  40 mg Oral BID   polyethylene glycol  17 g Oral Daily   rosuvastatin  20 mg Oral Daily   sertraline  25 mg Oral Daily   Continuous Infusions:  diltiazem (CARDIZEM) infusion 5 mg/hr (01/21/23 0716)   PRN Meds: acetaminophen **OR** acetaminophen, acetaminophen, albuterol, ondansetron **OR** ondansetron (ZOFRAN) IV   Vital Signs    Vitals:   01/21/23 0535 01/21/23 0611 01/21/23 0800 01/21/23 0912  BP:  90/68 104/80   Pulse:  (!) 119 (!) 133   Resp:   (!) 33   Temp: 98 F (36.7 C)     TempSrc: Oral     SpO2:  93% 91% 92%  Weight:      Height:        Intake/Output Summary (Last 24 hours) at 01/21/2023 0946 Last data filed at 01/21/2023 0716 Gross per 24 hour  Intake 165.96 ml  Output --  Net 165.96 ml      01/20/2023    1:55 PM 01/18/2023   12:04 PM 12/17/2022   10:50 AM  Last 3 Weights  Weight (lbs) 156 lb 15.5 oz 157 lb 136 lb  Weight (kg) 71.2 kg 71.215 kg 61.689 kg      Telemetry    Aflutter with RVR - Personally Reviewed  ECG    N/a - Personally Reviewed  Physical Exam   GEN: No acute distress.   Neck: No JVD Cardiac: irregular, tachycardic Respiratory: Clear to auscultation bilaterally. GI: Soft, nontender, non-distended  MS: No edema; No deformity. Neuro:  Nonfocal  Psych: Normal affect   Labs    High Sensitivity Troponin:   Recent Labs  Lab 01/20/23 1449 01/20/23 1632   TROPONINIHS 33* 26*     Chemistry Recent Labs  Lab 01/18/23 1227 01/20/23 1449 01/21/23 0349  NA 134* 139 136  K 4.0 3.4* 3.6  CL 106 111 109  CO2 18* 15* 17*  GLUCOSE 117* 135* 131*  BUN 26* 25* 28*  CREATININE 1.58* 1.46* 1.63*  CALCIUM 9.2 8.9 8.2*  PROT 7.2  --   --   ALBUMIN 3.6  --   --   AST 54*  --   --   ALT 32  --   --   ALKPHOS 85  --   --   BILITOT 0.7  --   --   GFRNONAA 33* 36* 32*  ANIONGAP 10 13 10     Lipids No results for input(s): "CHOL", "TRIG", "HDL", "LABVLDL", "LDLCALC", "CHOLHDL" in the last 168 hours.  Hematology Recent Labs  Lab 01/18/23 1227 01/20/23 1449 01/21/23 0349  WBC 11.2* 14.0* 11.2*  RBC 3.88 3.76* 3.15*  HGB 11.2* 10.9* 9.1*  HCT 35.7* 34.5* 28.6*  MCV 92.0 91.8 90.8  MCH 28.9 29.0 28.9  MCHC 31.4 31.6 31.8  RDW 16.4* 16.8*  16.9*  PLT ACLMP PLATELET CLUMPS NOTED ON SMEAR, COUNT APPEARS ADEQUATE 123*   Thyroid No results for input(s): "TSH", "FREET4" in the last 168 hours.  BNP Recent Labs  Lab 01/20/23 1449  BNP 905.0*    DDimer No results for input(s): "DDIMER" in the last 168 hours.   Radiology    DG Chest Portable 1 View  Result Date: 01/20/2023 CLINICAL DATA:  sob, tachycardia EXAM: PORTABLE CHEST 1 VIEW COMPARISON:  01/18/2023 FINDINGS: Bilateral lungs appear hyperexpanded and hyperlucent with coarse bronchovascular markings, in keeping with COPD. There are increased interstitial markings when compared to the prior exam, suggesting underlying mild pulmonary edema. There are probable atelectasis/scarring at the lung bases. Bilateral lungs otherwise appear clear. No acute consolidation or lung collapse. Bilateral costophrenic angles are clear. Stable cardio-mediastinal silhouette. No acute osseous abnormalities. The soft tissues are within normal limits. IMPRESSION: 1. COPD. 2. Suspected mild superimposed pulmonary edema. Electronically Signed   By: Jules Schick M.D.   On: 01/20/2023 16:27    Cardiac Studies     Patient Profile     Ms. Poepping is a 82 year old F known to have CAD manifested by NSTEMI in 2017 s/p LCx PCI with normal LVEF, paroxysmal atrial flutter, history of GI bleed s/p PRBC transfusions, COPD, Raynaud's syndrome, HTN, carotid artery stenosis, HLD presented to the ER with DOE x 3 days.   Assessment & Plan    1.Paroxysmal aflutter - presented with SOB/DOE. Found to be in aflutter with RVR.  - 11/20/22 EKG NSR - 01/18/23 ER visit with bronchitis, perhaps exacerbating cause for her recurrent aflutter.   - started on dilt gtt initially, soft bp's and limited rate control. Tried on IV amio but complained of infusion site pain and discontinued. Chronic lung disease long term amio would not be great option for her. CAD and labile renal function an alternative antiarrhythmic would also be limited.  - reports has not missed any doses of eliquis over the last 3 weeks - no availability currently in PACU with anesthesia, will discuss with ER staff if willing to cardiovert in ER. If back in SR possible could discharge later today, currently harbored in ER.    Addendum: spoke with Dr Deretha Emory, will perform cardioversion in the ER. We will reassess patient in the afternoon. If much improved could consider d/c from ER, if ongoing SOB from lung related issues could change bed request from stepdown to tele bed.     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   3. CAD -DES to LCX in 2017 in setting of NSTEMI - 09/2022 echo: LVEF 60-65%, no WMAs - no acute issues   4.Bronchitis/COPD - per primary team    For questions or updates, please contact [facility] Please consult [email or web address] for contact info under        Signed, Dina Rich, MD  01/21/2023, 9:46 AM

## 2023-01-21 NOTE — Progress Notes (Signed)
ER staff succesfully cardioverted patient to NSR. Some PACs, ectopic beats at times. Dilt gtt stopped, given her COPD change her home beta blocker to oral diltiazem. Start 30mg  every 6 hours with hold parameters, can consolidated dosing and adjust as needed. Soft bp's, continue low dose midodrine for now to allow av nodal agents.   Some ongoing SOB likely from some HF (BNP elevated, BNP with some edema) related to her tachycarrhythmia and also COPD with recent bronchitis. Primary team to change bed request to tele bed, work on these issues. I wrote for additional IV lasix this afternoon.  If recurrent aflutter over the weekend would retry IV amiodarone, I have not see it cause infusion site pain before, its unclear if related. Could consider short course given her lung disease if neccesary.    Dominga Ferry MD

## 2023-01-21 NOTE — Progress Notes (Signed)
PROGRESS NOTE    Patricia Jackson  NWG:[id number] DOB: 29-Oct-1940 DOA: 01/20/2023 PCP: Benita Stabile, MD   Brief Narrative:    [name] is a 82 y.o. female with medical history significant for pulmonary fibrosis, COPD, CKD, atrial fibrillation/flutter, GI bleed, hypertension. Patient was brought to the ED with reports of difficulty breathing over the past 3 days, breathing significantly worsened with minimal exertion.  Reports a new cough over the past 4 days that is dry.  No palpitations, no chest pain no lower extremity edema.  She has been compliant with her Eliquis last dose was this morning.  Patient was noted to have atrial fibrillation with RVR and has been started on Cardizem drip and has undergone cardioversion in the ED on 10/18 with conversion to normal sinus rhythm, but has some PACs and ectopic beats at times.  She has now been started on oral diltiazem and will require some diuresis due to heart failure symptoms.  Assessment & Plan:   Principal Problem:   Atrial fibrillation with RVR (HCC) Active Problems:   Hypertension   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HCC)   Chronic obstructive pulmonary disease (HCC)   Generalized anxiety disorder  Assessment and Plan:   Atrial fibrillation with RVR (HCC)-now NSR -Appreciate ED and cardiology with successful cardioversion to NSR, but with ongoing ectopic beats and PACs. -Continue oral diltiazem -Continue midodrine 5mg  TID -Resume Eliquis -IV diuresis for mild heart failure symptoms   Generalized anxiety disorder Resume sertraline bupropion   Chronic obstructive pulmonary disease (HCC) History of COPD and pulmonary fibrosis.  Not on home O2.  Chest x-ray suggest pulmonary edema.  Reports new dry cough over the past 4 days.  Was in ED for same- 2 days ago and was started on doxycycline -Continue doxycycline - Resume home BD regimen   Hypertension Blood pressure soft. -Hold home amlodipine and losartan, and metoprolol -Now on oral  Cardizem, monitor carefully -Monitor with IV diuresis    DVT prophylaxis:Eliquis Code Status: Full Family Communication: None at bedside Disposition Plan:  Status is: Inpatient Remains inpatient appropriate because: Need for IV medications and close monitoring  Consultants:  Cardiology  Procedures:  Synchronized cardioversion 10/18  Antimicrobials:  Anti-infectives (From admission, onward)    Start     Dose/Rate Route Frequency Ordered Stop   01/20/23 2245  doxycycline (VIBRA-TABS) tablet 100 mg       Note to Pharmacy: One po bid x 7 days     100 mg Oral 2 times daily 01/20/23 2241        Subjective: Patient seen and evaluated today with no new acute complaints or concerns. No acute concerns or events noted overnight.  She denies any chest pain, shortness of breath, or palpitations.  Objective: Vitals:   01/21/23 1207 01/21/23 1210 01/21/23 1215 01/21/23 1220  BP: 99/67 106/64 93/65 109/62  Pulse: 82 82 83 85  Resp: 18 (!) 34 (!) 24 (!) 32  Temp:      TempSrc:      SpO2: 97% 92% (!) 79% 91%  Weight:      Height:        Intake/Output Summary (Last 24 hours) at 01/21/2023 1402 Last data filed at 01/21/2023 1230 Gross per 24 hour  Intake 184.48 ml  Output --  Net 184.48 ml   Filed Weights   01/20/23 1355  Weight: 71.2 kg    Examination:  General exam: Appears calm and comfortable  Respiratory system: Clear to auscultation. Respiratory effort normal.  3.5 L nasal  cannula oxygen Cardiovascular system: S1 & S2 heard, RRR.  Gastrointestinal system: Abdomen is soft Central nervous system: Alert and awake Extremities: No edema Skin: No significant lesions noted Psychiatry: Flat affect.    Data Reviewed: I have personally reviewed following labs and imaging studies  CBC: Recent Labs  Lab 01/18/23 1227 01/20/23 1449 01/21/23 0349  WBC 11.2* 14.0* 11.2*  NEUTROABS 9.8*  --   --   HGB 11.2* 10.9* 9.1*  HCT 35.7* 34.5* 28.6*  MCV 92.0 91.8 90.8  PLT  ACLMP PLATELET CLUMPS NOTED ON SMEAR, COUNT APPEARS ADEQUATE 123*   Basic Metabolic Panel: Recent Labs  Lab 01/18/23 1227 01/20/23 1449 01/21/23 0349  NA 134* 139 136  K 4.0 3.4* 3.6  CL 106 111 109  CO2 18* 15* 17*  GLUCOSE 117* 135* 131*  BUN 26* 25* 28*  CREATININE 1.58* 1.46* 1.63*  CALCIUM 9.2 8.9 8.2*   GFR: Estimated Creatinine Clearance: 27.2 mL/min (A) (by C-G formula based on SCr of 1.63 mg/dL (H)). Liver Function Tests: Recent Labs  Lab 01/18/23 1227  AST 54*  ALT 32  ALKPHOS 85  BILITOT 0.7  PROT 7.2  ALBUMIN 3.6   No results for input(s): "LIPASE", "AMYLASE" in the last 168 hours. No results for input(s): "AMMONIA" in the last 168 hours. Coagulation Profile: Recent Labs  Lab 01/18/23 1227  INR 1.8*   Cardiac Enzymes: No results for input(s): "CKTOTAL", "CKMB", "CKMBINDEX", "TROPONINI" in the last 168 hours. BNP (last 3 results) No results for input(s): "PROBNP" in the last 8760 hours. HbA1C: No results for input(s): "HGBA1C" in the last 72 hours. CBG: No results for input(s): "GLUCAP" in the last 168 hours. Lipid Profile: No results for input(s): "CHOL", "HDL", "LDLCALC", "TRIG", "CHOLHDL", "LDLDIRECT" in the last 72 hours. Thyroid Function Tests: No results for input(s): "TSH", "T4TOTAL", "FREET4", "T3FREE", "THYROIDAB" in the last 72 hours. Anemia Panel: No results for input(s): "VITAMINB12", "FOLATE", "FERRITIN", "TIBC", "IRON", "RETICCTPCT" in the last 72 hours. Sepsis Labs: Recent Labs  Lab 01/18/23 1227  LATICACIDVEN 1.8    Recent Results (from the past 240 hour(s))  Resp panel by RT-PCR (RSV, Flu A&B, Covid) Anterior Nasal Swab     Status: None   Collection Time: 01/18/23 12:06 PM   Specimen: Anterior Nasal Swab  Result Value Ref Range Status   SARS Coronavirus 2 by RT PCR NEGATIVE NEGATIVE Final    Comment: (NOTE) SARS-CoV-2 target nucleic acids are NOT DETECTED.  The SARS-CoV-2 RNA is generally detectable in upper  respiratory specimens during the acute phase of infection. The lowest concentration of SARS-CoV-2 viral copies this assay can detect is 138 copies/mL. A negative result does not preclude SARS-Cov-2 infection and should not be used as the sole basis for treatment or other patient management decisions. A negative result may occur with  improper specimen collection/handling, submission of specimen other than nasopharyngeal swab, presence of viral mutation(s) within the areas targeted by this assay, and inadequate number of viral copies(<138 copies/mL). A negative result must be combined with clinical observations, patient history, and epidemiological information. The expected result is Negative.  Fact Sheet for Patients:  BloggerCourse.com  Fact Sheet for Healthcare Providers:  SeriousBroker.it  This test is no t yet approved or cleared by the [place] FDA and  has been authorized for detection and/or diagnosis of SARS-CoV-2 by FDA under an Emergency Use Authorization (EUA). This EUA will remain  in effect (meaning this test can be used) for the duration of the COVID-19 declaration under  Section 564(b)(1) of the Act, 21 U.S.C.section 360bbb-3(b)(1), unless the authorization is terminated  or revoked sooner.       Influenza A by PCR NEGATIVE NEGATIVE Final   Influenza B by PCR NEGATIVE NEGATIVE Final    Comment: (NOTE) The Xpert Xpress SARS-CoV-2/FLU/RSV plus assay is intended as an aid in the diagnosis of influenza from Nasopharyngeal swab specimens and should not be used as a sole basis for treatment. Nasal washings and aspirates are unacceptable for Xpert Xpress SARS-CoV-2/FLU/RSV testing.  Fact Sheet for Patients: BloggerCourse.com  Fact Sheet for Healthcare Providers: SeriousBroker.it  This test is not yet approved or cleared by the [place] FDA and has been  authorized for detection and/or diagnosis of SARS-CoV-2 by FDA under an Emergency Use Authorization (EUA). This EUA will remain in effect (meaning this test can be used) for the duration of the COVID-19 declaration under Section 564(b)(1) of the Act, 21 U.S.C. section 360bbb-3(b)(1), unless the authorization is terminated or revoked.     Resp Syncytial Virus by PCR NEGATIVE NEGATIVE Final    Comment: (NOTE) Fact Sheet for Patients: BloggerCourse.com  Fact Sheet for Healthcare Providers: SeriousBroker.it  This test is not yet approved or cleared by the [place] FDA and has been authorized for detection and/or diagnosis of SARS-CoV-2 by FDA under an Emergency Use Authorization (EUA). This EUA will remain in effect (meaning this test can be used) for the duration of the COVID-19 declaration under Section 564(b)(1) of the Act, 21 U.S.C. section 360bbb-3(b)(1), unless the authorization is terminated or revoked.  Performed at [facility], [address], [place], [place] [address]   Culture, blood (Routine x 2)     Status: None (Preliminary result)   Collection Time: 01/18/23 12:23 PM   Specimen: BLOOD  Result Value Ref Range Status   Specimen Description BLOOD BLOOD LEFT HAND  Final   Special Requests   Final    BOTTLES DRAWN AEROBIC AND ANAEROBIC Blood Culture results may not be optimal due to an excessive volume of blood received in culture bottles   Culture   Final    NO GROWTH 3 DAYS Performed at [facility], [address] [place], [place] [address]    Report Status PENDING  Incomplete  Culture, blood (Routine x 2)     Status: None (Preliminary result)   Collection Time: 01/18/23 12:27 PM   Specimen: BLOOD  Result Value Ref Range Status   Specimen Description BLOOD BLOOD RIGHT ARM  Final   Special Requests   Final    BOTTLES DRAWN AEROBIC AND ANAEROBIC Blood Culture results may not be optimal due to an excessive volume  of blood received in culture bottles   Culture   Final    NO GROWTH 3 DAYS Performed at [facility], [address], [place], [place] [address]    Report Status PENDING  Incomplete         Radiology Studies: DG Chest Portable 1 View  Result Date: 01/20/2023 CLINICAL DATA:  sob, tachycardia EXAM: PORTABLE CHEST 1 VIEW COMPARISON:  01/18/2023 FINDINGS: Bilateral lungs appear hyperexpanded and hyperlucent with coarse bronchovascular markings, in keeping with COPD. There are increased interstitial markings when compared to the prior exam, suggesting underlying mild pulmonary edema. There are probable atelectasis/scarring at the lung bases. Bilateral lungs otherwise appear clear. No acute consolidation or lung collapse. Bilateral costophrenic angles are clear. Stable cardio-mediastinal silhouette. No acute osseous abnormalities. The soft tissues are within normal limits. IMPRESSION: 1. COPD. 2. Suspected mild superimposed pulmonary  edema. Electronically Signed   By: Jules Schick M.D.   On: 01/20/2023 16:27        Scheduled Meds:  apixaban  5 mg Oral BID   atropine  1 drop Right Eye BID   benzonatate  100 mg Oral Q8H   buPROPion  150 mg Oral Daily   diltiazem  30 mg Oral QID   doxycycline  100 mg Oral BID   fluticasone furoate-vilanterol  1 puff Inhalation Daily   And   umeclidinium bromide  1 puff Inhalation Daily   furosemide  20 mg Intravenous Once   levothyroxine  50 mcg Oral Q0600   midodrine  5 mg Oral TID WC   pantoprazole  40 mg Oral BID   polyethylene glycol  17 g Oral Daily   prednisoLONE acetate  1 drop Right Eye BID   rosuvastatin  20 mg Oral Daily   sertraline  25 mg Oral Daily      LOS: 1 day    Time spent: 35 minutes    Karanvir Balderston Hoover Brunette, DO [facility]  If 7PM-7AM, please contact night-coverage [email or web address] 01/21/2023, 2:02 PM

## 2023-01-21 NOTE — TOC Initial Note (Signed)
Transition of Care [facility]) - Initial/Assessment Note    Patient Details  Name: Patricia Jackson MRN: [id number] Date of Birth: 07/28/1940  Transition of Care [facility]) CM/SW Contact:    Villa Herb, LCSWA Phone Number: 01/21/2023, 1:13 PM  Clinical Narrative:                 Pt is high risk for admission. Pt lives with her grandson and his wife. Pt states she is mostly independent in completing her ADLs. Pt states that if she cannot drive to appointments her family will get her there. Pt has had HH in the past. Pt has a wheelchair and a walker at home. Pt interested in a rollator, CSW to request that MD order and TOC will give referral to [employer]. TOC to follow.   Expected Discharge Plan: Home w Home Health Services Barriers to Discharge: Continued Medical Work up   Patient Goals and CMS Choice Patient states their goals for this hospitalization and ongoing recovery are:: return home CMS Medicare.gov Compare Post Acute Care list provided to:: Patient Choice offered to / list presented to : Patient      Expected Discharge Plan and Services In-house Referral: Clinical Social Work Discharge Planning Services: CM Consult Post Acute Care Choice: Home Health Living arrangements for the past 2 months: Single Family Home                                      Prior Living Arrangements/Services Living arrangements for the past 2 months: Single Family Home Lives with:: Relatives Patient language and need for interpreter reviewed:: Yes Do you feel safe going back to the place where you live?: Yes      Need for Family Participation in Patient Care: Yes (Comment) Care giver support system in place?: Yes (comment) Current home services: DME Criminal Activity/Legal Involvement Pertinent to Current Situation/Hospitalization: No - Comment as needed  Activities of Daily Living      Permission Sought/Granted                  Emotional Assessment Appearance:: Appears stated  age Attitude/Demeanor/Rapport: Engaged Affect (typically observed): Accepting Orientation: : Oriented to Self, Oriented to Place, Oriented to  Time, Oriented to Situation Alcohol / Substance Use: Not Applicable Psych Involvement: No (comment)  Admission diagnosis:  Atrial fibrillation with RVR (HCC) [I48.91] Patient Active Problem List   Diagnosis Date Noted   Atrial fibrillation with RVR (HCC) 01/20/2023   On anticoagulant therapy 11/21/2022   ABLA (acute blood loss anemia) 11/21/2022   Raynaud's syndrome 11/20/2022   Osteoarthritis 11/20/2022   Generalized anxiety disorder 11/18/2022   Positive antinuclear antibody 11/02/2022   Insomnia 11/02/2022   Gastroesophageal reflux disease without esophagitis 11/01/2022   Abrasion of lower back 10/11/2022   Atrial fibrillation (HCC) 09/23/2022   Vitamin D deficiency 09/23/2022   Syncope 09/13/2022   CAP (community acquired pneumonia) 09/12/2022   sepsis from pneumonia 07/26/2022   intermittent confusion 07/26/2022   Hyponatremia 07/26/2022   Preoperative evaluation of a medical condition to rule out surgical contraindications (TAR required) 05/14/2022   Fracture of distal end of radius 05/02/2022   Left wrist pain 04/28/2022   Osteoporosis 01/19/2022   DOE (dyspnea on exertion)    Acidosis, unspecified 06/20/2021   Elevated troponin 06/19/2021   Iron deficiency anemia 06/12/2021   Hypothyroidism 05/15/2021   Chronic kidney disease, 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   Right  epiretinal membrane 02/17/2021   Hypertension 12/10/2020   Retinal hemorrhage, right eye 11/04/2020   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   Traction detachment of left retina 08/03/2019   Left retinal detachment 08/01/2019   Age-related macular degeneration 08/01/2019   Retinal hemorrhage of left eye 08/01/2019   Advanced nonexudative age-related macular degeneration of left eye with subfoveal involvement 08/01/2019   Retinal detachment 07/26/2019    Choroidal detachment of right eye 07/26/2019   Exudative retinopathy of left eye 07/26/2019   Thrombocytopenic disorder (HCC) 07/04/2019   Orthostatic syncope 08/27/2017   Atypical chest pain 08/27/2017   Atrial flutter (HCC) 10/28/2016   Chronic kidney disease, stage 4 (severe) (HCC) 10/28/2016   Chronic obstructive pulmonary disease (HCC) 10/28/2016   Depression 10/28/2016   AKI (acute kidney injury) (HCC) 09/08/2016   Esophageal dysphagia    Anemia 04/07/2016   CAD in native artery 03/16/2016   S/P angioplasty with stent 03/15/16 DES Resolute, pLCX 03/16/2016   NSTEMI (non-ST elevated myocardial infarction) (HCC) 03/12/2016   Dyspnea on exertion    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HCC) 10/31/2015   Hyperlipidemia 10/31/2015   Atrial flutter with rapid ventricular response (HCC) 10/30/2015   Back pain 11/26/2013   Arm pain 11/26/2013   Chest pain 11/26/2013   PCP:  Benita Stabile, MD Pharmacy:   [facility] - [place],  - [address] [place] [address] Phone: [phone] Fax: [phone]     Social Determinants of Health (SDOH) Social History: SDOH Screenings   Food Insecurity: No Food Insecurity (11/26/2022)  Housing: Low Risk  (09/12/2022)  Transportation Needs: No Transportation Needs (09/12/2022)  Utilities: Not At Risk (09/12/2022)  Depression (PHQ2-9): Low Risk  (11/26/2022)  Financial Resource Strain: Low Risk  (11/26/2022)  Social Connections: Moderately Integrated (11/26/2022)  Stress: No Stress Concern Present (11/26/2022)  Tobacco Use: Medium Risk (01/20/2023)  Health Literacy: Adequate Health Literacy (11/26/2022)   SDOH Interventions:     Readmission Risk Interventions    01/21/2023    1:10 PM 06/22/2021   12:59 PM  Readmission Risk Prevention Plan  Transportation Screening Complete Complete  PCP or Specialist Appt within 5-7 Days  Complete  Home Care Screening  Complete  Medication Review (RN CM)  Complete  Medication Review (RN  Care Manager) Complete   HRI or Home Care Consult Complete   SW Recovery Care/Counseling Consult Complete   Palliative Care Screening Not Applicable   Skilled Nursing Facility Not Applicable

## 2023-01-22 DIAGNOSIS — I4891 Unspecified atrial fibrillation: Secondary | ICD-10-CM | POA: Diagnosis not present

## 2023-01-22 LAB — BASIC METABOLIC PANEL
Anion gap: 8 (ref 5–15)
BUN: 35 mg/dL — ABNORMAL HIGH (ref 8–23)
CO2: 19 mmol/L — ABNORMAL LOW (ref 22–32)
Calcium: 8.3 mg/dL — ABNORMAL LOW (ref 8.9–10.3)
Chloride: 110 mmol/L (ref 98–111)
Creatinine, Ser: 1.77 mg/dL — ABNORMAL HIGH (ref 0.44–1.00)
GFR, Estimated: 29 mL/min — ABNORMAL LOW (ref >60)
Glucose, Bld: 99 mg/dL (ref 70–99)
Potassium: 4.1 mmol/L (ref 3.5–5.1)
Sodium: 137 mmol/L (ref 135–145)

## 2023-01-22 LAB — CBC
HCT: 29.3 % — ABNORMAL LOW (ref 36.0–46.0)
Hemoglobin: 9.2 g/dL — ABNORMAL LOW (ref 12.0–15.0)
MCH: 29 pg (ref 26.0–34.0)
MCHC: 31.4 g/dL (ref 30.0–36.0)
MCV: 92.4 fL (ref 80.0–100.0)
Platelets: 113 10*3/uL — ABNORMAL LOW (ref 150–400)
RBC: 3.17 MIL/uL — ABNORMAL LOW (ref 3.87–5.11)
RDW: 17.2 % — ABNORMAL HIGH (ref 11.5–15.5)
WBC: 7 10*3/uL (ref 4.0–10.5)
nRBC: 0 % (ref 0.0–0.2)

## 2023-01-22 LAB — MAGNESIUM: Magnesium: 2 mg/dL (ref 1.7–2.4)

## 2023-01-22 MED ORDER — DILTIAZEM HCL ER COATED BEADS 120 MG PO CP24
120.0000 mg | ORAL_CAPSULE | Freq: Every day | ORAL | Status: DC
  Administered 2023-01-22 – 2023-01-27 (×6): 120 mg via ORAL
  Filled 2023-01-22 (×6): qty 1

## 2023-01-22 MED ORDER — BUDESONIDE 0.25 MG/2ML IN SUSP
0.2500 mg | Freq: Two times a day (BID) | RESPIRATORY_TRACT | Status: DC
  Administered 2023-01-22 – 2023-01-27 (×11): 0.25 mg via RESPIRATORY_TRACT
  Filled 2023-01-22 (×11): qty 2

## 2023-01-22 NOTE — Progress Notes (Signed)
   01/22/23 2154  Assess: MEWS Score  Temp 97.9 F (36.6 C)  BP 100/62  MAP (mmHg) 72  Pulse Rate 75  Resp (!) 22  SpO2 93 %  O2 Device Nasal Cannula  Assess: MEWS Score  MEWS Temp 0  MEWS Systolic 1  MEWS Pulse 0  MEWS RR 1  MEWS LOC 0  MEWS Score 2  MEWS Score Color Yellow  Assess: if the MEWS score is Yellow or Red  Were vital signs accurate and taken at a resting state? Yes  Does the patient meet 2 or more of the SIRS criteria? No  MEWS guidelines implemented  Yes, yellow  Treat  MEWS Interventions Considered administering scheduled or prn medications/treatments as ordered  Take Vital Signs  Increase Vital Sign Frequency  Yellow: Q2hr x1, continue Q4hrs until patient remains green for 12hrs  Escalate  MEWS: Escalate Yellow: Discuss with charge nurse and consider notifying provider and/or RRT  Assess: SIRS CRITERIA  SIRS Temperature  0  SIRS Pulse 0  SIRS Respirations  1  SIRS WBC 0  SIRS Score Sum  1

## 2023-01-22 NOTE — Plan of Care (Signed)

## 2023-01-22 NOTE — Progress Notes (Signed)
   01/22/23 1000  ReDS Vest / Clip  Station Marker C  Ruler Value 32  ReDS Value Range < 36  ReDS Actual Value 19

## 2023-01-22 NOTE — Progress Notes (Signed)
PROGRESS NOTE    Patricia Jackson  WNU:[id number] DOB: [birth year] DOA: 01/20/2023 PCP: Benita Stabile, MD   Brief Narrative:    Patricia Jackson is a 82 y.o. female with medical history significant for pulmonary fibrosis, COPD, CKD, atrial fibrillation/flutter, GI bleed, hypertension. Patient was brought to the ED with reports of difficulty breathing over the past 3 days, breathing significantly worsened with minimal exertion.  Reports a new cough over the past 4 days that is dry.  No palpitations, no chest pain no lower extremity edema.  She has been compliant with her Eliquis last dose was this morning.  Patient was noted to have atrial fibrillation with RVR and has been started on Cardizem drip and has undergone cardioversion in the ED on 10/18 with conversion to normal sinus rhythm, but has some PACs and ectopic beats at times.  She has now been started on oral diltiazem which has been consolidated, but continues to have some ongoing issues with bronchitis.  She appears to have diuresed adequately.  Oxygen needs to be further weaned if possible.  Assessment & Plan:   Principal Problem:   Atrial fibrillation with RVR (HCC) Active Problems:   Hypertension   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HCC)   Chronic obstructive pulmonary disease (HCC)   Generalized anxiety disorder  Assessment and Plan:   Atrial fibrillation with RVR (HCC)-now NSR -Appreciate ED and cardiology with successful cardioversion to NSR, but with ongoing ectopic beats and PACs. -Continue oral diltiazem now consolidated to 120 CD, continue to monitor on telemetry -Continue midodrine 5mg  TID -Resume Eliquis -IV diuresis for mild heart failure symptoms   Generalized anxiety disorder Resume sertraline bupropion   Chronic obstructive pulmonary disease (HCC) History of COPD and pulmonary fibrosis.  Not on home O2.  Chest x-ray suggest pulmonary edema.  Reports new dry cough over the past 4 days.  Was in ED for same- 2 days ago and was  started on doxycycline -Continue doxycycline - Resume home BD regimen   Hypertension Blood pressure remains soft. -Hold home amlodipine and losartan, and metoprolol -Now on oral Cardizem, monitor carefully -No further need for diuresis at this time    DVT prophylaxis:Eliquis Code Status: Full Family Communication: None at bedside Disposition Plan:  Status is: Inpatient Remains inpatient appropriate because: Need for IV medications and close monitoring  Consultants:  Cardiology  Procedures:  Synchronized cardioversion 10/18  Antimicrobials:  Anti-infectives (From admission, onward)    Start     Dose/Rate Route Frequency Ordered Stop   01/20/23 2245  doxycycline (VIBRA-TABS) tablet 100 mg       Note to Pharmacy: One po bid x 7 days     100 mg Oral 2 times daily 01/20/23 2241        Subjective: Patient seen and evaluated today with ongoing significant cough and wheezing noted.  Heart rates remain improved and stable.  Objective: Vitals:   01/21/23 2118 01/21/23 2306 01/22/23 0204 01/22/23 0521  BP: 98/60 112/64 101/62 102/66  Pulse: 81 80 84 77  Resp: 20 (!) 26 (!) 22   Temp: 97.6 F (36.4 C) 97.6 F (36.4 C) 98.1 F (36.7 C) 97.9 F (36.6 C)  TempSrc: Oral Oral Oral Oral  SpO2: 92% 92% 93% 96%  Weight:      Height:        Intake/Output Summary (Last 24 hours) at 01/22/2023 1152 Last data filed at 01/21/2023 1230 Gross per 24 hour  Intake 18.52 ml  Output --  Net 18.52 ml   Ceasar Mons  Weights   01/20/23 1355  Weight: 71.2 kg    Examination:  General exam: Appears calm and comfortable  Respiratory system: Wheezing and congestion bilaterally. Respiratory effort normal.  3.5 L nasal cannula oxygen Cardiovascular system: S1 & S2 heard, RRR.  Gastrointestinal system: Abdomen is soft Central nervous system: Alert and awake Extremities: No edema Skin: No significant lesions noted Psychiatry: Flat affect.    Data Reviewed: I have personally reviewed  following labs and imaging studies  CBC: Recent Labs  Lab 01/18/23 1227 01/20/23 1449 01/21/23 0349 01/22/23 0506  WBC 11.2* 14.0* 11.2* 7.0  NEUTROABS 9.8*  --   --   --   HGB 11.2* 10.9* 9.1* 9.2*  HCT 35.7* 34.5* 28.6* 29.3*  MCV 92.0 91.8 90.8 92.4  PLT ACLMP PLATELET CLUMPS NOTED ON SMEAR, COUNT APPEARS ADEQUATE 123* 113*   Basic Metabolic Panel: Recent Labs  Lab 01/18/23 1227 01/20/23 1449 01/21/23 0349 01/22/23 0506  NA 134* 139 136 137  K 4.0 3.4* 3.6 4.1  CL 106 111 109 110  CO2 18* 15* 17* 19*  GLUCOSE 117* 135* 131* 99  BUN 26* 25* 28* 35*  CREATININE 1.58* 1.46* 1.63* 1.77*  CALCIUM 9.2 8.9 8.2* 8.3*  MG  --   --   --  2.0   GFR: Estimated Creatinine Clearance: 25.1 mL/min (A) (by C-G formula based on SCr of 1.77 mg/dL (H)). Liver Function Tests: Recent Labs  Lab 01/18/23 1227  AST 54*  ALT 32  ALKPHOS 85  BILITOT 0.7  PROT 7.2  ALBUMIN 3.6   No results for input(s): "LIPASE", "AMYLASE" in the last 168 hours. No results for input(s): "AMMONIA" in the last 168 hours. Coagulation Profile: Recent Labs  Lab 01/18/23 1227  INR 1.8*   Cardiac Enzymes: No results for input(s): "CKTOTAL", "CKMB", "CKMBINDEX", "TROPONINI" in the last 168 hours. BNP (last 3 results) No results for input(s): "PROBNP" in the last 8760 hours. HbA1C: No results for input(s): "HGBA1C" in the last 72 hours. CBG: No results for input(s): "GLUCAP" in the last 168 hours. Lipid Profile: No results for input(s): "CHOL", "HDL", "LDLCALC", "TRIG", "CHOLHDL", "LDLDIRECT" in the last 72 hours. Thyroid Function Tests: No results for input(s): "TSH", "T4TOTAL", "FREET4", "T3FREE", "THYROIDAB" in the last 72 hours. Anemia Panel: No results for input(s): "VITAMINB12", "FOLATE", "FERRITIN", "TIBC", "IRON", "RETICCTPCT" in the last 72 hours. Sepsis Labs: Recent Labs  Lab 01/18/23 1227  LATICACIDVEN 1.8    Recent Results (from the past 240 hour(s))  Resp panel by RT-PCR (RSV,  Flu A&B, Covid) Anterior Nasal Swab     Status: None   Collection Time: 01/18/23 12:06 PM   Specimen: Anterior Nasal Swab  Result Value Ref Range Status   SARS Coronavirus 2 by RT PCR NEGATIVE NEGATIVE Final    Comment: (NOTE) SARS-CoV-2 target nucleic acids are NOT DETECTED.  The SARS-CoV-2 RNA is generally detectable in upper respiratory specimens during the acute phase of infection. The lowest concentration of SARS-CoV-2 viral copies this assay can detect is 138 copies/mL. A negative result does not preclude SARS-Cov-2 infection and should not be used as the sole basis for treatment or other patient management decisions. A negative result may occur with  improper specimen collection/handling, submission of specimen other than nasopharyngeal swab, presence of viral mutation(s) within the areas targeted by this assay, and inadequate number of viral copies(<138 copies/mL). A negative result must be combined with clinical observations, patient history, and epidemiological information. The expected result is Negative.  Fact Sheet for  Patients:  BloggerCourse.com  Fact Sheet for Healthcare Providers:  SeriousBroker.it  This test is no t yet approved or cleared by the [place] FDA and  has been authorized for detection and/or diagnosis of SARS-CoV-2 by FDA under an Emergency Use Authorization (EUA). This EUA will remain  in effect (meaning this test can be used) for the duration of the COVID-19 declaration under Section 564(b)(1) of the Act, 21 U.S.C.section 360bbb-3(b)(1), unless the authorization is terminated  or revoked sooner.       Influenza A by PCR NEGATIVE NEGATIVE Final   Influenza B by PCR NEGATIVE NEGATIVE Final    Comment: (NOTE) The Xpert Xpress SARS-CoV-2/FLU/RSV plus assay is intended as an aid in the diagnosis of influenza from Nasopharyngeal swab specimens and should not be used as a sole basis for  treatment. Nasal washings and aspirates are unacceptable for Xpert Xpress SARS-CoV-2/FLU/RSV testing.  Fact Sheet for Patients: BloggerCourse.com  Fact Sheet for Healthcare Providers: SeriousBroker.it  This test is not yet approved or cleared by the [place] FDA and has been authorized for detection and/or diagnosis of SARS-CoV-2 by FDA under an Emergency Use Authorization (EUA). This EUA will remain in effect (meaning this test can be used) for the duration of the COVID-19 declaration under Section 564(b)(1) of the Act, 21 U.S.C. section 360bbb-3(b)(1), unless the authorization is terminated or revoked.     Resp Syncytial Virus by PCR NEGATIVE NEGATIVE Final    Comment: (NOTE) Fact Sheet for Patients: BloggerCourse.com  Fact Sheet for Healthcare Providers: SeriousBroker.it  This test is not yet approved or cleared by the [place] FDA and has been authorized for detection and/or diagnosis of SARS-CoV-2 by FDA under an Emergency Use Authorization (EUA). This EUA will remain in effect (meaning this test can be used) for the duration of the COVID-19 declaration under Section 564(b)(1) of the Act, 21 U.S.C. section 360bbb-3(b)(1), unless the authorization is terminated or revoked.  Performed at [facility], [address], [place], [place] [address]   Culture, blood (Routine x 2)     Status: None (Preliminary result)   Collection Time: 01/18/23 12:23 PM   Specimen: BLOOD  Result Value Ref Range Status   Specimen Description BLOOD BLOOD LEFT HAND  Final   Special Requests   Final    BOTTLES DRAWN AEROBIC AND ANAEROBIC Blood Culture results may not be optimal due to an excessive volume of blood received in culture bottles   Culture   Final    NO GROWTH 4 DAYS Performed at [facility], [address], [place], [place] [address]    Report Status PENDING  Incomplete   Culture, blood (Routine x 2)     Status: None (Preliminary result)   Collection Time: 01/18/23 12:27 PM   Specimen: BLOOD  Result Value Ref Range Status   Specimen Description BLOOD BLOOD RIGHT ARM  Final   Special Requests   Final    BOTTLES DRAWN AEROBIC AND ANAEROBIC Blood Culture results may not be optimal due to an excessive volume of blood received in culture bottles   Culture   Final    NO GROWTH 4 DAYS Performed at [facility], [address], [place], [place] [address]    Report Status PENDING  Incomplete         Radiology Studies: DG Chest Portable 1 View  Result Date: 01/20/2023 CLINICAL DATA:  sob, tachycardia EXAM: PORTABLE CHEST 1 VIEW COMPARISON:  01/18/2023 FINDINGS: Bilateral lungs appear hyperexpanded and hyperlucent with coarse bronchovascular markings,  in keeping with COPD. There are increased interstitial markings when compared to the prior exam, suggesting underlying mild pulmonary edema. There are probable atelectasis/scarring at the lung bases. Bilateral lungs otherwise appear clear. No acute consolidation or lung collapse. Bilateral costophrenic angles are clear. Stable cardio-mediastinal silhouette. No acute osseous abnormalities. The soft tissues are within normal limits. IMPRESSION: 1. COPD. 2. Suspected mild superimposed pulmonary edema. Electronically Signed   By: Jules Schick M.D.   On: 01/20/2023 16:27        Scheduled Meds:  apixaban  5 mg Oral BID   atropine  1 drop Right Eye BID   benzonatate  100 mg Oral Q8H   budesonide (PULMICORT) nebulizer solution  0.25 mg Nebulization BID   buPROPion  150 mg Oral Daily   diltiazem  120 mg Oral Daily   doxycycline  100 mg Oral BID   feeding supplement  237 mL Oral BID BM   influenza vaccine adjuvanted  0.5 mL Intramuscular Tomorrow-1000   levothyroxine  50 mcg Oral Q0600   midodrine  5 mg Oral TID WC   pantoprazole  40 mg Oral BID   polyethylene glycol  17 g Oral Daily   prednisoLONE acetate  1  drop Right Eye BID   rosuvastatin  20 mg Oral Daily   sertraline  25 mg Oral Daily      LOS: 2 days    Time spent: 35 minutes    Donnae Michels Hoover Brunette, DO [facility]  If 7PM-7AM, please contact night-coverage [email or web address] 01/22/2023, 11:52 AM

## 2023-01-22 NOTE — Progress Notes (Signed)
   01/22/23 1447  Assess: MEWS Score  Temp 97.6 F (36.4 C)  BP 91/60  Pulse Rate 64  SpO2 99 %  O2 Device Room Air  Assess: MEWS Score  MEWS Temp 0  MEWS Systolic 1  MEWS Pulse 0  MEWS RR 1  MEWS LOC 0  MEWS Score 2  MEWS Score Color Yellow  Assess: if the MEWS score is Yellow or Red  Were vital signs accurate and taken at a resting state? Yes  Does the patient meet 2 or more of the SIRS criteria? No  MEWS guidelines implemented  Yes, yellow  Treat  MEWS Interventions Considered administering scheduled or prn medications/treatments as ordered  Take Vital Signs  Increase Vital Sign Frequency  Yellow: Q2hr x1, continue Q4hrs until patient remains green for 12hrs  Escalate  MEWS: Escalate Yellow: Discuss with charge nurse and consider notifying provider and/or RRT  Notify: Charge Nurse/RN  Name of Charge Nurse/RN Notified Chales Abrahams, RN  Provider Notification  Provider Name/Title DR Sherryll Burger  Date Provider Notified 01/22/23  Time Provider Notified 1504  Method of Notification Page  Notification Reason Change in status  Provider response No new orders  Date of Provider Response 01/22/23  Time of Provider Response 1320  Assess: SIRS CRITERIA  SIRS Temperature  0  SIRS Pulse 0  SIRS Respirations  0  SIRS WBC 0  SIRS Score Sum  0   No new orders. Patient continues to go between yellow and green mews.

## 2023-01-22 NOTE — Progress Notes (Signed)
SATURATION QUALIFICATIONS: (This note is used to comply with regulatory documentation for home oxygen)  Patient Saturations on Room Air at Rest = 87%  Patient Saturations on Room Air while Ambulating = 77%  Patient Saturations on 2.5 Liters of oxygen while Ambulating = 93%  Please briefly explain why patient needs home oxygen: The patient had to stop frequently during the 6 minute walk test. Saturations at the lowest when ambulating was 77% and at the highest was 82% when ambulating. The patient ambulating with assistance x 2 back to her room and was placed in her chair. After oxygen placed back on the patient  at 2.5% her Saturations went up to 93%. Patient resting comfortably sitting in her room.

## 2023-01-22 NOTE — Progress Notes (Signed)
   01/21/23 2306  Assess: MEWS Score  Temp 97.6 F (36.4 C)  BP 112/64  MAP (mmHg) 78  Pulse Rate 80  Resp (!) 26  SpO2 92 %  O2 Device Nasal Cannula  Assess: MEWS Score  MEWS Temp 0  MEWS Systolic 0  MEWS Pulse 0  MEWS RR 2  MEWS LOC 0  MEWS Score 2  MEWS Score Color Yellow  Assess: if the MEWS score is Yellow or Red  Were vital signs accurate and taken at a resting state? Yes  Does the patient meet 2 or more of the SIRS criteria? No  MEWS guidelines implemented  Yes, yellow  Treat  MEWS Interventions Considered administering scheduled or prn medications/treatments as ordered  Take Vital Signs  Increase Vital Sign Frequency  Yellow: Q2hr x1, continue Q4hrs until patient remains green for 12hrs  Escalate  MEWS: Escalate Yellow: Discuss with charge nurse and consider notifying provider and/or RRT  Notify: Charge Nurse/RN  Name of Charge Nurse/RN Notified Radiation protection practitioner, RN  Assess: SIRS CRITERIA  SIRS Temperature  0  SIRS Pulse 0  SIRS Respirations  1  SIRS WBC 0  SIRS Score Sum  1

## 2023-01-22 NOTE — Progress Notes (Deleted)
SATURATION QUALIFICATIONS: (This note is used to comply with regulatory documentation for home oxygen)  Patient Saturations on Room Air at Rest = 77%  Patient Saturations on Room Air while Ambulating = unable  Patient Saturations on 2.5 Liters of oxygen while Ambulating = 93%  Please briefly explain why patient needs home oxygen: Patient can't tolerate room air at rest.

## 2023-01-22 NOTE — Progress Notes (Signed)
Mews documentation not initiated again; patient was having a walk test completed.

## 2023-01-22 NOTE — Progress Notes (Addendum)
Pt c/o L chest pain that radiates towards her back. EKG done and in chart. Pt's SP02 was 85% on RA. Put pt on 2L Abanda. Pt's SP02 was then staying at 90% so she was bumped up to 3L.   Pus filled blister on RFA noted during assessment.   2200 dose of Diltiazem held per MD order.

## 2023-01-23 DIAGNOSIS — I4891 Unspecified atrial fibrillation: Secondary | ICD-10-CM | POA: Diagnosis not present

## 2023-01-23 LAB — CBC
HCT: 29.5 % — ABNORMAL LOW (ref 36.0–46.0)
Hemoglobin: 9.4 g/dL — ABNORMAL LOW (ref 12.0–15.0)
MCH: 29.4 pg (ref 26.0–34.0)
MCHC: 31.9 g/dL (ref 30.0–36.0)
MCV: 92.2 fL (ref 80.0–100.0)
Platelets: 121 10*3/uL — ABNORMAL LOW (ref 150–400)
RBC: 3.2 MIL/uL — ABNORMAL LOW (ref 3.87–5.11)
RDW: 16.9 % — ABNORMAL HIGH (ref 11.5–15.5)
WBC: 4.9 10*3/uL (ref 4.0–10.5)
nRBC: 0 % (ref 0.0–0.2)

## 2023-01-23 LAB — BASIC METABOLIC PANEL
Anion gap: 8 (ref 5–15)
BUN: 30 mg/dL — ABNORMAL HIGH (ref 8–23)
CO2: 20 mmol/L — ABNORMAL LOW (ref 22–32)
Calcium: 8.5 mg/dL — ABNORMAL LOW (ref 8.9–10.3)
Chloride: 108 mmol/L (ref 98–111)
Creatinine, Ser: 1.52 mg/dL — ABNORMAL HIGH (ref 0.44–1.00)
GFR, Estimated: 35 mL/min — ABNORMAL LOW (ref >60)
Glucose, Bld: 86 mg/dL (ref 70–99)
Potassium: 4.2 mmol/L (ref 3.5–5.1)
Sodium: 136 mmol/L (ref 135–145)

## 2023-01-23 LAB — CULTURE, BLOOD (ROUTINE X 2)
Culture: NO GROWTH
Culture: NO GROWTH

## 2023-01-23 LAB — MAGNESIUM: Magnesium: 1.9 mg/dL (ref 1.7–2.4)

## 2023-01-23 MED ORDER — DM-GUAIFENESIN ER 30-600 MG PO TB12
1.0000 | ORAL_TABLET | Freq: Two times a day (BID) | ORAL | Status: DC
  Administered 2023-01-23 – 2023-01-27 (×9): 1 via ORAL
  Filled 2023-01-23 (×10): qty 1

## 2023-01-23 MED ORDER — PREDNISONE 20 MG PO TABS
40.0000 mg | ORAL_TABLET | Freq: Every day | ORAL | Status: DC
  Administered 2023-01-23 – 2023-01-24 (×2): 40 mg via ORAL
  Filled 2023-01-23 (×3): qty 2

## 2023-01-23 MED ORDER — GUAIFENESIN-DM 100-10 MG/5ML PO SYRP: 10.0000 mL | ORAL_SOLUTION | ORAL | Status: DC | PRN

## 2023-01-23 NOTE — Progress Notes (Signed)
Flu shot given R deltoid pt tolerated well.

## 2023-01-23 NOTE — Progress Notes (Signed)
PROGRESS NOTE    Patricia Jackson  IRJ:[id number] DOB: Aug 15, 1940 DOA: 01/20/2023 PCP: Benita Stabile, MD   Brief Narrative:    Patricia Jackson is a 82 y.o. female with medical history significant for pulmonary fibrosis, COPD, CKD, atrial fibrillation/flutter, GI bleed, hypertension. Patient was brought to the ED with reports of difficulty breathing over the past 3 days, breathing significantly worsened with minimal exertion.  Reports a new cough over the past 4 days that is dry.  No palpitations, no chest pain no lower extremity edema.  She has been compliant with her Eliquis last dose was this morning.  Patient was noted to have atrial fibrillation with RVR and has been started on Cardizem drip and has undergone cardioversion in the ED on 10/18 with conversion to normal sinus rhythm, but has some PACs and ectopic beats at times.  She has now been started on oral diltiazem which has been consolidated, but continues to have some ongoing issues with bronchitis.  She appears to have diuresed adequately.  Oxygen needs to be further weaned if possible and she continues to have significant issues with bronchitis/COPD.  Assessment & Plan:   Principal Problem:   Atrial fibrillation with RVR (HCC) Active Problems:   Hypertension   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HCC)   Chronic obstructive pulmonary disease (HCC)   Generalized anxiety disorder  Assessment and Plan:   Atrial fibrillation with RVR (HCC)-now NSR -Appreciate ED and cardiology with successful cardioversion to NSR, but with ongoing ectopic beats and PACs. -Continue oral diltiazem now consolidated to 120 CD, continue to monitor on telemetry -Continue midodrine 5mg  TID -Continue Eliquis -No longer requiring diuresis   Generalized anxiety disorder Resume sertraline bupropion   Chronic obstructive pulmonary disease (HCC) History of COPD and pulmonary fibrosis.  Not on home O2.  Chest x-ray suggest pulmonary edema.  Reports new dry cough over the  past 4 days.  Was in ED for same- 2 days ago and was started on doxycycline -Continue doxycycline - Resume home BD regimen -Start mucolytic agents and continue antitussives -Flutter valve and start prednisone 40 mg oral daily   Hypertension Blood pressure remains soft. -Hold home amlodipine and losartan, and metoprolol -Now on oral Cardizem, monitor carefully -No further need for diuresis at this time    DVT prophylaxis:Eliquis Code Status: Full Family Communication: None at bedside Disposition Plan:  Status is: Inpatient Remains inpatient appropriate because: Need for IV medications and close monitoring  Consultants:  Cardiology  Procedures:  Synchronized cardioversion 10/18  Antimicrobials:  Anti-infectives (From admission, onward)    Start     Dose/Rate Route Frequency Ordered Stop   01/20/23 2245  doxycycline (VIBRA-TABS) tablet 100 mg       Note to Pharmacy: One po bid x 7 days     100 mg Oral 2 times daily 01/20/23 2241        Subjective: Patient seen and evaluated today with ongoing chest congestion and cough with wheezing noted.  Heart rates are stable.  Objective: Vitals:   01/22/23 2154 01/23/23 0434 01/23/23 0830 01/23/23 0939  BP: 100/62 115/66 121/77   Pulse: 75 88 82   Resp: (!) 22 20    Temp: 97.9 F (36.6 C) 98 F (36.7 C) 98 F (36.7 C)   TempSrc: Oral Oral Oral   SpO2: 93% 94% 96% 94%  Weight:      Height:        Intake/Output Summary (Last 24 hours) at 01/23/2023 1032 Last data filed at 01/23/2023 0434 Gross  per 24 hour  Intake 240 ml  Output 500 ml  Net -260 ml   Filed Weights   01/20/23 1355  Weight: 71.2 kg    Examination:  General exam: Appears calm and comfortable  Respiratory system: Wheezing and congestion bilaterally. Respiratory effort normal.  3.5 L nasal cannula oxygen Cardiovascular system: S1 & S2 heard, RRR.  Gastrointestinal system: Abdomen is soft Central nervous system: Alert and awake Extremities: No  edema Skin: No significant lesions noted Psychiatry: Flat affect.    Data Reviewed: I have personally reviewed following labs and imaging studies  CBC: Recent Labs  Lab 01/18/23 1227 01/20/23 1449 01/21/23 0349 01/22/23 0506 01/23/23 0716  WBC 11.2* 14.0* 11.2* 7.0 4.9  NEUTROABS 9.8*  --   --   --   --   HGB 11.2* 10.9* 9.1* 9.2* 9.4*  HCT 35.7* 34.5* 28.6* 29.3* 29.5*  MCV 92.0 91.8 90.8 92.4 92.2  PLT ACLMP PLATELET CLUMPS NOTED ON SMEAR, COUNT APPEARS ADEQUATE 123* 113* 121*   Basic Metabolic Panel: Recent Labs  Lab 01/18/23 1227 01/20/23 1449 01/21/23 0349 01/22/23 0506 01/23/23 0411  NA 134* 139 136 137 136  K 4.0 3.4* 3.6 4.1 4.2  CL 106 111 109 110 108  CO2 18* 15* 17* 19* 20*  GLUCOSE 117* 135* 131* 99 86  BUN 26* 25* 28* 35* 30*  CREATININE 1.58* 1.46* 1.63* 1.77* 1.52*  CALCIUM 9.2 8.9 8.2* 8.3* 8.5*  MG  --   --   --  2.0 1.9   GFR: Estimated Creatinine Clearance: 29.2 mL/min (A) (by C-G formula based on SCr of 1.52 mg/dL (H)). Liver Function Tests: Recent Labs  Lab 01/18/23 1227  AST 54*  ALT 32  ALKPHOS 85  BILITOT 0.7  PROT 7.2  ALBUMIN 3.6   No results for input(s): "LIPASE", "AMYLASE" in the last 168 hours. No results for input(s): "AMMONIA" in the last 168 hours. Coagulation Profile: Recent Labs  Lab 01/18/23 1227  INR 1.8*   Cardiac Enzymes: No results for input(s): "CKTOTAL", "CKMB", "CKMBINDEX", "TROPONINI" in the last 168 hours. BNP (last 3 results) No results for input(s): "PROBNP" in the last 8760 hours. HbA1C: No results for input(s): "HGBA1C" in the last 72 hours. CBG: No results for input(s): "GLUCAP" in the last 168 hours. Lipid Profile: No results for input(s): "CHOL", "HDL", "LDLCALC", "TRIG", "CHOLHDL", "LDLDIRECT" in the last 72 hours. Thyroid Function Tests: No results for input(s): "TSH", "T4TOTAL", "FREET4", "T3FREE", "THYROIDAB" in the last 72 hours. Anemia Panel: No results for input(s): "VITAMINB12",  "FOLATE", "FERRITIN", "TIBC", "IRON", "RETICCTPCT" in the last 72 hours. Sepsis Labs: Recent Labs  Lab 01/18/23 1227  LATICACIDVEN 1.8    Recent Results (from the past 240 hour(s))  Resp panel by RT-PCR (RSV, Flu A&B, Covid) Anterior Nasal Swab     Status: None   Collection Time: 01/18/23 12:06 PM   Specimen: Anterior Nasal Swab  Result Value Ref Range Status   SARS Coronavirus 2 by RT PCR NEGATIVE NEGATIVE Final    Comment: (NOTE) SARS-CoV-2 target nucleic acids are NOT DETECTED.  The SARS-CoV-2 RNA is generally detectable in upper respiratory specimens during the acute phase of infection. The lowest concentration of SARS-CoV-2 viral copies this assay can detect is 138 copies/mL. A negative result does not preclude SARS-Cov-2 infection and should not be used as the sole basis for treatment or other patient management decisions. A negative result may occur with  improper specimen collection/handling, submission of specimen other than nasopharyngeal swab, presence of  viral mutation(s) within the areas targeted by this assay, and inadequate number of viral copies(<138 copies/mL). A negative result must be combined with clinical observations, patient history, and epidemiological information. The expected result is Negative.  Fact Sheet for Patients:  BloggerCourse.com  Fact Sheet for Healthcare Providers:  SeriousBroker.it  This test is no t yet approved or cleared by the [place] FDA and  has been authorized for detection and/or diagnosis of SARS-CoV-2 by FDA under an Emergency Use Authorization (EUA). This EUA will remain  in effect (meaning this test can be used) for the duration of the COVID-19 declaration under Section 564(b)(1) of the Act, 21 U.S.C.section 360bbb-3(b)(1), unless the authorization is terminated  or revoked sooner.       Influenza A by PCR NEGATIVE NEGATIVE Final   Influenza B by PCR NEGATIVE  NEGATIVE Final    Comment: (NOTE) The Xpert Xpress SARS-CoV-2/FLU/RSV plus assay is intended as an aid in the diagnosis of influenza from Nasopharyngeal swab specimens and should not be used as a sole basis for treatment. Nasal washings and aspirates are unacceptable for Xpert Xpress SARS-CoV-2/FLU/RSV testing.  Fact Sheet for Patients: BloggerCourse.com  Fact Sheet for Healthcare Providers: SeriousBroker.it  This test is not yet approved or cleared by the [place] FDA and has been authorized for detection and/or diagnosis of SARS-CoV-2 by FDA under an Emergency Use Authorization (EUA). This EUA will remain in effect (meaning this test can be used) for the duration of the COVID-19 declaration under Section 564(b)(1) of the Act, 21 U.S.C. section 360bbb-3(b)(1), unless the authorization is terminated or revoked.     Resp Syncytial Virus by PCR NEGATIVE NEGATIVE Final    Comment: (NOTE) Fact Sheet for Patients: BloggerCourse.com  Fact Sheet for Healthcare Providers: SeriousBroker.it  This test is not yet approved or cleared by the [place] FDA and has been authorized for detection and/or diagnosis of SARS-CoV-2 by FDA under an Emergency Use Authorization (EUA). This EUA will remain in effect (meaning this test can be used) for the duration of the COVID-19 declaration under Section 564(b)(1) of the Act, 21 U.S.C. section 360bbb-3(b)(1), unless the authorization is terminated or revoked.  Performed at [facility], [address] [place], [place] [address]   Culture, blood (Routine x 2)     Status: None   Collection Time: 01/18/23 12:23 PM   Specimen: BLOOD  Result Value Ref Range Status   Specimen Description BLOOD BLOOD LEFT HAND  Final   Special Requests   Final    BOTTLES DRAWN AEROBIC AND ANAEROBIC Blood Culture results may not be optimal due to an excessive  volume of blood received in culture bottles   Culture   Final    NO GROWTH 5 DAYS Performed at [facility], [address], [place], [place] [address]    Report Status 01/23/2023 FINAL  Final  Culture, blood (Routine x 2)     Status: None   Collection Time: 01/18/23 12:27 PM   Specimen: BLOOD  Result Value Ref Range Status   Specimen Description BLOOD BLOOD RIGHT ARM  Final   Special Requests   Final    BOTTLES DRAWN AEROBIC AND ANAEROBIC Blood Culture results may not be optimal due to an excessive volume of blood received in culture bottles   Culture   Final    NO GROWTH 5 DAYS Performed at [facility], [address] [place], [place] [address]    Report Status 01/23/2023 FINAL  Final  Radiology Studies: No results found.      Scheduled Meds:  apixaban  5 mg Oral BID   atropine  1 drop Right Eye BID   benzonatate  100 mg Oral Q8H   budesonide (PULMICORT) nebulizer solution  0.25 mg Nebulization BID   buPROPion  150 mg Oral Daily   dextromethorphan-guaiFENesin  1 tablet Oral BID   diltiazem  120 mg Oral Daily   doxycycline  100 mg Oral BID   feeding supplement  237 mL Oral BID BM   influenza vaccine adjuvanted  0.5 mL Intramuscular Tomorrow-1000   levothyroxine  50 mcg Oral Q0600   midodrine  5 mg Oral TID WC   pantoprazole  40 mg Oral BID   polyethylene glycol  17 g Oral Daily   prednisoLONE acetate  1 drop Right Eye BID   rosuvastatin  20 mg Oral Daily   sertraline  25 mg Oral Daily      LOS: 3 days    Time spent: 35 minutes    Kalyiah Saintil Hoover Brunette, DO [facility]  If 7PM-7AM, please contact night-coverage [email or web address] 01/23/2023, 10:32 AM

## 2023-01-24 DIAGNOSIS — I4891 Unspecified atrial fibrillation: Secondary | ICD-10-CM | POA: Diagnosis not present

## 2023-01-24 DIAGNOSIS — E440 Moderate protein-calorie malnutrition: Secondary | ICD-10-CM | POA: Insufficient documentation

## 2023-01-24 LAB — BASIC METABOLIC PANEL
Anion gap: 7 (ref 5–15)
BUN: 30 mg/dL — ABNORMAL HIGH (ref 8–23)
CO2: 18 mmol/L — ABNORMAL LOW (ref 22–32)
Calcium: 8.9 mg/dL (ref 8.9–10.3)
Chloride: 109 mmol/L (ref 98–111)
Creatinine, Ser: 1.39 mg/dL — ABNORMAL HIGH (ref 0.44–1.00)
GFR, Estimated: 39 mL/min — ABNORMAL LOW (ref >60)
Glucose, Bld: 170 mg/dL — ABNORMAL HIGH (ref 70–99)
Potassium: 4.7 mmol/L (ref 3.5–5.1)
Sodium: 134 mmol/L — ABNORMAL LOW (ref 135–145)

## 2023-01-24 LAB — CBC
HCT: 30.2 % — ABNORMAL LOW (ref 36.0–46.0)
Hemoglobin: 9.6 g/dL — ABNORMAL LOW (ref 12.0–15.0)
MCH: 29.1 pg (ref 26.0–34.0)
MCHC: 31.8 g/dL (ref 30.0–36.0)
MCV: 91.5 fL (ref 80.0–100.0)
Platelets: 135 10*3/uL — ABNORMAL LOW (ref 150–400)
RBC: 3.3 MIL/uL — ABNORMAL LOW (ref 3.87–5.11)
RDW: 16.9 % — ABNORMAL HIGH (ref 11.5–15.5)
WBC: 4 10*3/uL (ref 4.0–10.5)
nRBC: 0 % (ref 0.0–0.2)

## 2023-01-24 LAB — MAGNESIUM: Magnesium: 1.9 mg/dL (ref 1.7–2.4)

## 2023-01-24 MED ORDER — SORBITOL 70 % SOLN
960.0000 mL | TOPICAL_OIL | Freq: Once | ORAL | Status: AC
  Administered 2023-01-24: 960 mL via RECTAL
  Filled 2023-01-24: qty 240

## 2023-01-24 MED ORDER — ADULT MULTIVITAMIN W/MINERALS CH
1.0000 | ORAL_TABLET | Freq: Every day | ORAL | Status: DC
  Administered 2023-01-24 – 2023-01-27 (×4): 1 via ORAL
  Filled 2023-01-24 (×4): qty 1

## 2023-01-24 NOTE — Progress Notes (Signed)
   Cardiologist:  Branch  Subjective:   Still with cough/bronchitis  Objective:  Vitals:   01/23/23 2000 01/24/23 0532 01/24/23 0708 01/24/23 0823  BP: 138/70 133/71  139/72  Pulse: 66 77    Resp:      Temp: 97.7 F (36.5 C) 97.8 F (36.6 C)    TempSrc: Oral Oral    SpO2: 97% 98% 98%   Weight:      Height:        Intake/Output from previous day:  Intake/Output Summary (Last 24 hours) at 01/24/2023 0835 Last data filed at 01/23/2023 1715 Gross per 24 hour  Intake 720 ml  Output 900 ml  Net -180 ml    Physical Exam:  Elderly female Cough no active wheezing No murmur  Abdomen benign No edema  Neuro non focal Palpable pedal pulses   Lab Results: Basic Metabolic Panel: Recent Labs    01/23/23 0411 01/24/23 0456  NA 136 134*  K 4.2 4.7  CL 108 109  CO2 20* 18*  GLUCOSE 86 170*  BUN 30* 30*  CREATININE 1.52* 1.39*  CALCIUM 8.5* 8.9  MG 1.9 1.9   Liver Function Tests: No results for input(s): "AST", "ALT", "ALKPHOS", "BILITOT", "PROT", "ALBUMIN" in the last 72 hours. No results for input(s): "LIPASE", "AMYLASE" in the last 72 hours. CBC: Recent Labs    01/23/23 0716 01/24/23 0456  WBC 4.9 4.0  HGB 9.4* 9.6*  HCT 29.5* 30.2*  MCV 92.2 91.5  PLT 121* 135*     Imaging: No results found.  Cardiac Studies:  ECG: SR rate 78 non specific ST changes    Telemetry:  NSR   Echo: EF 60-65% midl MR normal LA  Medications:    apixaban  5 mg Oral BID   atropine  1 drop Right Eye BID   benzonatate  100 mg Oral Q8H   budesonide (PULMICORT) nebulizer solution  0.25 mg Nebulization BID   buPROPion  150 mg Oral Daily   dextromethorphan-guaiFENesin  1 tablet Oral BID   diltiazem  120 mg Oral Daily   doxycycline  100 mg Oral BID   feeding supplement  237 mL Oral BID BM   levothyroxine  50 mcg Oral Q0600   midodrine  5 mg Oral TID WC   pantoprazole  40 mg Oral BID   polyethylene glycol  17 g Oral Daily   prednisoLONE acetate  1 drop Right Eye BID    predniSONE  40 mg Oral Q breakfast   rosuvastatin  20 mg Oral Daily   sertraline  25 mg Oral Daily      Assessment/Plan:  Ms. Pegues is a 82 year old F known to have CAD manifested by NSTEMI in 2017 s/p LCx PCI with normal LVEF, paroxysmal atrial flutter, history of GI bleed s/p PRBC transfusions, COPD, Raynaud's syndrome, HTN, carotid artery stenosis, HLD presented to the ER with DOE x 3 days.    PAF:  cardioverted in ER 01/21/23 on eliquis and oral cardizem  CAD DES to LCX 2017 stable no angina TTE normal on current TTE HLD:  continue statin Bronchitis:  on prednisone and doxycycline  CXR 10/17 COPD continue nebs and inhalers   Charlton Haws 01/24/2023, 8:35 AM

## 2023-01-24 NOTE — Progress Notes (Signed)
Pt complains of not having a BM for almost two weeks MD ordered SMOG enema. Pt tolerated SMOG enema well and had several hard stools.

## 2023-01-24 NOTE — Progress Notes (Signed)
PROGRESS NOTE    Patricia Jackson  ZOX:[id number] DOB: 01/26/1941 DOA: 01/20/2023 PCP: Benita Stabile, MD   Brief Narrative:    Patricia Jackson is a 82 y.o. female with medical history significant for pulmonary fibrosis, COPD, CKD, atrial fibrillation/flutter, GI bleed, hypertension. Patient was brought to the ED with reports of difficulty breathing over the past 3 days, breathing significantly worsened with minimal exertion.  Reports a new cough over the past 4 days that is dry.  No palpitations, no chest pain no lower extremity edema.  She has been compliant with her Eliquis last dose was this morning.  Patient was noted to have atrial fibrillation with RVR and has been started on Cardizem drip and has undergone cardioversion in the ED on 10/18 with conversion to normal sinus rhythm, but has some PACs and ectopic beats at times.  She has now been started on oral diltiazem which has been consolidated, but continues to have some ongoing issues with bronchitis.  She appears to have diuresed adequately.  Oxygen needs to be further weaned if possible and she continues to have significant issues with bronchitis/COPD.  Assessment & Plan:   Principal Problem:   Atrial fibrillation with RVR (HCC) Active Problems:   Hypertension   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HCC)   Chronic obstructive pulmonary disease (HCC)   Generalized anxiety disorder  Assessment and Plan:   Atrial fibrillation with RVR (HCC)-now NSR -Appreciate ED and cardiology with successful cardioversion to NSR, but with ongoing ectopic beats and PACs. -Continue oral diltiazem now consolidated to 120 CD, continue to monitor on telemetry -Continue midodrine 5mg  TID -Continue Eliquis -No longer requiring diuresis   Generalized anxiety disorder Resume sertraline bupropion   Chronic obstructive pulmonary disease (HCC) History of COPD and pulmonary fibrosis.  Not on home O2.  Chest x-ray suggest pulmonary edema.  Reports new dry cough over the  past 4 days.  Was in ED for same- 2 days ago and was started on doxycycline -Continue doxycycline - Resume home BD regimen -Start mucolytic agents and continue antitussives -Flutter valve and start prednisone 40 mg oral daily   Hypertension Blood pressure remains soft. -Hold home amlodipine and losartan, and metoprolol -Now on oral Cardizem, monitor carefully -No further need for diuresis at this time    DVT prophylaxis:Eliquis Code Status: Full Family Communication: None at bedside Disposition Plan:  Status is: Inpatient Remains inpatient appropriate because: Need for IV medications and close monitoring  Consultants:  Cardiology  Procedures:  Synchronized cardioversion 10/18  Antimicrobials:  Anti-infectives (From admission, onward)    Start     Dose/Rate Route Frequency Ordered Stop   01/20/23 2245  doxycycline (VIBRA-TABS) tablet 100 mg       Note to Pharmacy: One po bid x 7 days     100 mg Oral 2 times daily 01/20/23 2241        Subjective: Patient seen and evaluated today with ongoing chest congestion and cough with wheezing noted that has not improved very much.  She becomes quite hypoxemic with ambulation.  Heart rates are stable.  Objective: Vitals:   01/23/23 2000 01/24/23 0532 01/24/23 0708 01/24/23 0823  BP: 138/70 133/71  139/72  Pulse: 66 77    Resp:      Temp: 97.7 F (36.5 C) 97.8 F (36.6 C)    TempSrc: Oral Oral    SpO2: 97% 98% 98%   Weight:      Height:        Intake/Output Summary (Last 24 hours) at  01/24/2023 1308 Last data filed at 01/23/2023 1715 Gross per 24 hour  Intake 480 ml  Output 900 ml  Net -420 ml   Filed Weights   01/20/23 1355  Weight: 71.2 kg    Examination:  General exam: Appears calm and comfortable  Respiratory system: Wheezing and congestion bilaterally. Respiratory effort normal.  3.5 L nasal cannula oxygen Cardiovascular system: S1 & S2 heard, RRR.  Gastrointestinal system: Abdomen is soft Central nervous  system: Alert and awake Extremities: No edema Skin: No significant lesions noted Psychiatry: Flat affect.    Data Reviewed: I have personally reviewed following labs and imaging studies  CBC: Recent Labs  Lab 01/18/23 1227 01/20/23 1449 01/21/23 0349 01/22/23 0506 01/23/23 0716 01/24/23 0456  WBC 11.2* 14.0* 11.2* 7.0 4.9 4.0  NEUTROABS 9.8*  --   --   --   --   --   HGB 11.2* 10.9* 9.1* 9.2* 9.4* 9.6*  HCT 35.7* 34.5* 28.6* 29.3* 29.5* 30.2*  MCV 92.0 91.8 90.8 92.4 92.2 91.5  PLT ACLMP PLATELET CLUMPS NOTED ON SMEAR, COUNT APPEARS ADEQUATE 123* 113* 121* 135*   Basic Metabolic Panel: Recent Labs  Lab 01/20/23 1449 01/21/23 0349 01/22/23 0506 01/23/23 0411 01/24/23 0456  NA 139 136 137 136 134*  K 3.4* 3.6 4.1 4.2 4.7  CL 111 109 110 108 109  CO2 15* 17* 19* 20* 18*  GLUCOSE 135* 131* 99 86 170*  BUN 25* 28* 35* 30* 30*  CREATININE 1.46* 1.63* 1.77* 1.52* 1.39*  CALCIUM 8.9 8.2* 8.3* 8.5* 8.9  MG  --   --  2.0 1.9 1.9   GFR: Estimated Creatinine Clearance: 31.9 mL/min (A) (by C-G formula based on SCr of 1.39 mg/dL (H)). Liver Function Tests: Recent Labs  Lab 01/18/23 1227  AST 54*  ALT 32  ALKPHOS 85  BILITOT 0.7  PROT 7.2  ALBUMIN 3.6   No results for input(s): "LIPASE", "AMYLASE" in the last 168 hours. No results for input(s): "AMMONIA" in the last 168 hours. Coagulation Profile: Recent Labs  Lab 01/18/23 1227  INR 1.8*   Cardiac Enzymes: No results for input(s): "CKTOTAL", "CKMB", "CKMBINDEX", "TROPONINI" in the last 168 hours. BNP (last 3 results) No results for input(s): "PROBNP" in the last 8760 hours. HbA1C: No results for input(s): "HGBA1C" in the last 72 hours. CBG: No results for input(s): "GLUCAP" in the last 168 hours. Lipid Profile: No results for input(s): "CHOL", "HDL", "LDLCALC", "TRIG", "CHOLHDL", "LDLDIRECT" in the last 72 hours. Thyroid Function Tests: No results for input(s): "TSH", "T4TOTAL", "FREET4", "T3FREE",  "THYROIDAB" in the last 72 hours. Anemia Panel: No results for input(s): "VITAMINB12", "FOLATE", "FERRITIN", "TIBC", "IRON", "RETICCTPCT" in the last 72 hours. Sepsis Labs: Recent Labs  Lab 01/18/23 1227  LATICACIDVEN 1.8    Recent Results (from the past 240 hour(s))  Resp panel by RT-PCR (RSV, Flu A&B, Covid) Anterior Nasal Swab     Status: None   Collection Time: 01/18/23 12:06 PM   Specimen: Anterior Nasal Swab  Result Value Ref Range Status   SARS Coronavirus 2 by RT PCR NEGATIVE NEGATIVE Final    Comment: (NOTE) SARS-CoV-2 target nucleic acids are NOT DETECTED.  The SARS-CoV-2 RNA is generally detectable in upper respiratory specimens during the acute phase of infection. The lowest concentration of SARS-CoV-2 viral copies this assay can detect is 138 copies/mL. A negative result does not preclude SARS-Cov-2 infection and should not be used as the sole basis for treatment or other patient management decisions. A negative  result may occur with  improper specimen collection/handling, submission of specimen other than nasopharyngeal swab, presence of viral mutation(s) within the areas targeted by this assay, and inadequate number of viral copies(<138 copies/mL). A negative result must be combined with clinical observations, patient history, and epidemiological information. The expected result is Negative.  Fact Sheet for Patients:  BloggerCourse.com  Fact Sheet for Healthcare Providers:  SeriousBroker.it  This test is no t yet approved or cleared by the [place] FDA and  has been authorized for detection and/or diagnosis of SARS-CoV-2 by FDA under an Emergency Use Authorization (EUA). This EUA will remain  in effect (meaning this test can be used) for the duration of the COVID-19 declaration under Section 564(b)(1) of the Act, 21 U.S.C.section 360bbb-3(b)(1), unless the authorization is terminated  or revoked sooner.        Influenza A by PCR NEGATIVE NEGATIVE Final   Influenza B by PCR NEGATIVE NEGATIVE Final    Comment: (NOTE) The Xpert Xpress SARS-CoV-2/FLU/RSV plus assay is intended as an aid in the diagnosis of influenza from Nasopharyngeal swab specimens and should not be used as a sole basis for treatment. Nasal washings and aspirates are unacceptable for Xpert Xpress SARS-CoV-2/FLU/RSV testing.  Fact Sheet for Patients: BloggerCourse.com  Fact Sheet for Healthcare Providers: SeriousBroker.it  This test is not yet approved or cleared by the [place] FDA and has been authorized for detection and/or diagnosis of SARS-CoV-2 by FDA under an Emergency Use Authorization (EUA). This EUA will remain in effect (meaning this test can be used) for the duration of the COVID-19 declaration under Section 564(b)(1) of the Act, 21 U.S.C. section 360bbb-3(b)(1), unless the authorization is terminated or revoked.     Resp Syncytial Virus by PCR NEGATIVE NEGATIVE Final    Comment: (NOTE) Fact Sheet for Patients: BloggerCourse.com  Fact Sheet for Healthcare Providers: SeriousBroker.it  This test is not yet approved or cleared by the [place] FDA and has been authorized for detection and/or diagnosis of SARS-CoV-2 by FDA under an Emergency Use Authorization (EUA). This EUA will remain in effect (meaning this test can be used) for the duration of the COVID-19 declaration under Section 564(b)(1) of the Act, 21 U.S.C. section 360bbb-3(b)(1), unless the authorization is terminated or revoked.  Performed at [facility], [address], [place], [place] [address]   Culture, blood (Routine x 2)     Status: None   Collection Time: 01/18/23 12:23 PM   Specimen: BLOOD  Result Value Ref Range Status   Specimen Description BLOOD BLOOD LEFT HAND  Final   Special Requests   Final    BOTTLES DRAWN  AEROBIC AND ANAEROBIC Blood Culture results may not be optimal due to an excessive volume of blood received in culture bottles   Culture   Final    NO GROWTH 5 DAYS Performed at [facility], [address] [place], [place] [address]    Report Status 01/23/2023 FINAL  Final  Culture, blood (Routine x 2)     Status: None   Collection Time: 01/18/23 12:27 PM   Specimen: BLOOD  Result Value Ref Range Status   Specimen Description BLOOD BLOOD RIGHT ARM  Final   Special Requests   Final    BOTTLES DRAWN AEROBIC AND ANAEROBIC Blood Culture results may not be optimal due to an excessive volume of blood received in culture bottles   Culture   Final    NO GROWTH 5 DAYS Performed at [facility], [address], [place],  [place] [address]    Report Status 01/23/2023 FINAL  Final         Radiology Studies: No results found.      Scheduled Meds:  apixaban  5 mg Oral BID   atropine  1 drop Right Eye BID   benzonatate  100 mg Oral Q8H   budesonide (PULMICORT) nebulizer solution  0.25 mg Nebulization BID   buPROPion  150 mg Oral Daily   dextromethorphan-guaiFENesin  1 tablet Oral BID   diltiazem  120 mg Oral Daily   doxycycline  100 mg Oral BID   feeding supplement  237 mL Oral BID BM   levothyroxine  50 mcg Oral Q0600   midodrine  5 mg Oral TID WC   pantoprazole  40 mg Oral BID   polyethylene glycol  17 g Oral Daily   prednisoLONE acetate  1 drop Right Eye BID   predniSONE  40 mg Oral Q breakfast   rosuvastatin  20 mg Oral Daily   sertraline  25 mg Oral Daily      LOS: 4 days    Time spent: 35 minutes    Robey Massmann Hoover Brunette, DO [facility]  If 7PM-7AM, please contact night-coverage [email or web address] 01/24/2023, 1:08 PM

## 2023-01-24 NOTE — Progress Notes (Signed)
SATURATION QUALIFICATIONS: (This note is used to comply with regulatory documentation for home oxygen)  Patient Saturations on Room Air at Rest = 94%  Patient Saturations on Room Air while Ambulating = 82%  Patient Saturations on 2 Liters of oxygen while Ambulating = 98%  Please briefly explain why patient needs home oxygen: Pts o2 dropped below 90% while ambulating. PT has a unsteady gait and was SOB upon exertion.

## 2023-01-24 NOTE — Progress Notes (Signed)
Initial Nutrition Assessment  DOCUMENTATION CODES:   Non-severe (moderate) malnutrition in context of chronic illness  INTERVENTION:   Ensure Plus High Protein po BID, each supplement provides 350 kcal and 20 grams of protein. MVI with minerals daily.  NUTRITION DIAGNOSIS:   Moderate Malnutrition related to chronic illness (COPD) as evidenced by mild muscle depletion, mild fat depletion.  GOAL:   Patient will meet greater than or equal to 90% of their needs  MONITOR:   PO intake, Supplement acceptance  REASON FOR ASSESSMENT:   Malnutrition Screening Tool    ASSESSMENT:   82 yo female admitted with A fib with RVR. PMH includes pulmonary fibrosis, COPD, CKD, A fib/flutter, HTN, GI bleed, breast cancer, CAD iron deficiency anemia, NSTEMI.  Patient reports usual intake at home is 1-2 meals per day. She eats breakfast or lunch, then dinner if she feels like it. She has had Ensure supplements in the past, but hasn't bought any recently. She drank one this morning. She thinks she has lost some weight, but unsure. She received a SMOG enema this AM d/t not having a BM in the past 2 weeks. Patient had several hard stools after enema.  Weight hx reviewed. Weight has fluctuated from 61.6 kg to 71.2 kg over the past 4 months.   Currently on a heart healthy diet, meal intakes documented at 20-80%.  Labs reviewed. Na 134  Medications reviewed and include miralax, prednisone.  Patient meets criteria for moderate malnutrition, given mild-moderate depletion of muscle and subcutaneous fat mass.  NUTRITION - FOCUSED PHYSICAL EXAM:  Flowsheet Row Most Recent Value  Orbital Region Mild depletion  Upper Arm Region No depletion  Thoracic and Lumbar Region No depletion  Buccal Region Moderate depletion  Temple Region Mild depletion  Clavicle Bone Region Mild depletion  Clavicle and Acromion Bone Region Mild depletion  Scapular Bone Region No depletion  Dorsal Hand Mild depletion   Patellar Region No depletion  Anterior Thigh Region No depletion  Posterior Calf Region Mild depletion  Edema (RD Assessment) None  Hair Reviewed  Eyes Reviewed  Mouth Reviewed  Skin Reviewed  Nails Reviewed       Diet Order:   Diet Order             Diet Heart Fluid consistency: Thin  Diet effective now                   EDUCATION NEEDS:   Education needs have been addressed  Skin:  Skin Assessment: Reviewed RN Assessment  Last BM:  10/21  Height:   Ht Readings from Last 1 Encounters:  01/20/23 5\' 7"  (1.702 m)    Weight:   Wt Readings from Last 1 Encounters:  01/20/23 71.2 kg    Ideal Body Weight:  61.4 kg  BMI:  Body mass index is 24.58 kg/m.  Estimated Nutritional Needs:   Kcal:  1600-1800  Protein:  80-90 gm  Fluid:  1.6-1.8 L   Gabriel Rainwater RD, LDN, CNSC Please refer to Amion for contact information.

## 2023-01-25 ENCOUNTER — Inpatient Hospital Stay (HOSPITAL_COMMUNITY): Payer: 59

## 2023-01-25 DIAGNOSIS — I4891 Unspecified atrial fibrillation: Secondary | ICD-10-CM | POA: Diagnosis not present

## 2023-01-25 LAB — BASIC METABOLIC PANEL
Anion gap: 8 (ref 5–15)
BUN: 46 mg/dL — ABNORMAL HIGH (ref 8–23)
CO2: 21 mmol/L — ABNORMAL LOW (ref 22–32)
Calcium: 9.4 mg/dL (ref 8.9–10.3)
Chloride: 107 mmol/L (ref 98–111)
Creatinine, Ser: 1.54 mg/dL — ABNORMAL HIGH (ref 0.44–1.00)
GFR, Estimated: 34 mL/min — ABNORMAL LOW (ref >60)
Glucose, Bld: 111 mg/dL — ABNORMAL HIGH (ref 70–99)
Potassium: 4.9 mmol/L (ref 3.5–5.1)
Sodium: 136 mmol/L (ref 135–145)

## 2023-01-25 LAB — CBC
HCT: 30.3 % — ABNORMAL LOW (ref 36.0–46.0)
Hemoglobin: 9.3 g/dL — ABNORMAL LOW (ref 12.0–15.0)
MCH: 28.3 pg (ref 26.0–34.0)
MCHC: 30.7 g/dL (ref 30.0–36.0)
MCV: 92.1 fL (ref 80.0–100.0)
Platelets: DECREASED 10*3/uL (ref 150–400)
RBC: 3.29 MIL/uL — ABNORMAL LOW (ref 3.87–5.11)
RDW: 16.9 % — ABNORMAL HIGH (ref 11.5–15.5)
WBC: 8.3 10*3/uL (ref 4.0–10.5)
nRBC: 0 % (ref 0.0–0.2)

## 2023-01-25 LAB — MAGNESIUM: Magnesium: 2.1 mg/dL (ref 1.7–2.4)

## 2023-01-25 MED ORDER — MIDODRINE HCL 5 MG PO TABS
2.5000 mg | ORAL_TABLET | Freq: Three times a day (TID) | ORAL | Status: DC
  Administered 2023-01-25 – 2023-01-26 (×4): 2.5 mg via ORAL
  Filled 2023-01-25 (×4): qty 1

## 2023-01-25 MED ORDER — METHYLPREDNISOLONE SODIUM SUCC 40 MG IJ SOLR
40.0000 mg | Freq: Two times a day (BID) | INTRAMUSCULAR | Status: DC
  Administered 2023-01-25 – 2023-01-26 (×3): 40 mg via INTRAVENOUS
  Filled 2023-01-25 (×3): qty 1

## 2023-01-25 NOTE — Plan of Care (Signed)
  Problem: Coping: Goal: Level of anxiety will decrease Outcome: Progressing   Problem: Pain Managment: Goal: General experience of comfort will improve Outcome: Progressing   Problem: Safety: Goal: Ability to remain free from injury will improve Outcome: Progressing   

## 2023-01-25 NOTE — Evaluation (Signed)
Physical Therapy Evaluation Patient Details Name: Patricia Jackson MRN: [id number] DOB: 1941/02/01 Today's Date: 01/25/2023  History of Present Illness  Patricia Jackson is a 82 y.o. female with medical history significant for pulmonary fibrosis, COPD, CKD, atrial fibrillation/flutter, GI bleed, hypertension.  Patient was brought to the ED with reports of difficulty breathing over the past 3 days, breathing significantly worsened with minimal exertion.  Reports a new cough over the past 4 days that is dry.  No palpitations, no chest pain no lower extremity edema.  She has been compliant with her Eliquis last dose was this morning.   Clinical Impression  Patient very unsteady on feet and required the use of a RW for safety during ambulation, fatigues easily mostly due to SOB with poor carryover to limited mouth breathing resulting in SpO2 dropping from 95% to 86% while on room air, once seated SpO2 increased to 95% after resting in chair on room air - nursing staff notified.  Patient will benefit from continued skilled physical therapy in hospital and recommended venue below to increase strength, balance, endurance for safe ADLs and gait.         If plan is discharge home, recommend the following: A little help with walking and/or transfers;A little help with bathing/dressing/bathroom;Help with stairs or ramp for entrance;Assistance with cooking/housework   Can travel by private vehicle        Equipment Recommendations None recommended by PT  Recommendations for Other Services       Functional Status Assessment Patient has had a recent decline in their functional status and demonstrates the ability to make significant improvements in function in a reasonable and predictable amount of time.     Precautions / Restrictions Precautions Precautions: Fall Restrictions Weight Bearing Restrictions: No      Mobility  Bed Mobility Overal bed mobility: Needs Assistance Bed Mobility: Supine to  Sit     Supine to sit: Min assist     General bed mobility comments: increased time, labored movement    Transfers Overall transfer level: Needs assistance Equipment used: Rolling walker (2 wheels), None Transfers: Sit to/from Stand, Bed to chair/wheelchair/BSC Sit to Stand: Min assist   Step pivot transfers: Min assist       General transfer comment: very unsteady on feet requiring hand held assist when not using an AD, safer using RW    Ambulation/Gait Ambulation/Gait assistance: Min assist Gait Distance (Feet): 45 Feet Assistive device: Rolling walker (2 wheels) Gait Pattern/deviations: Decreased step length - right, Decreased step length - left, Decreased stride length Gait velocity: slow     General Gait Details: slow labored cadence with occasional standing rest breaks due to fatigue and SOB with SpO2 dropping from 95% to 86% on room air  Stairs            Wheelchair Mobility     Tilt Bed    Modified Rankin (Stroke Patients Only)       Balance Overall balance assessment: Needs assistance Sitting-balance support: Feet supported, No upper extremity supported Sitting balance-Leahy Scale: Fair Sitting balance - Comments: fair/good seated at EOB   Standing balance support: During functional activity, No upper extremity supported Standing balance-Leahy Scale: Poor Standing balance comment: fair using RW                             Pertinent Vitals/Pain Pain Assessment Pain Assessment: No/denies pain    Home Living Family/patient expects to be discharged to:: Private  residence Living Arrangements: Other relatives Available Help at Discharge: Family;Available 24 hours/day Type of Home: House Home Access: Stairs to enter Entrance Stairs-Rails: None Entrance Stairs-Number of Steps: 3   Home Layout: Two level;Able to live on main level with bedroom/bathroom Home Equipment: Rolling Walker (2 wheels);Shower seat      Prior Function  Prior Level of Function : Needs assist;History of Falls (last six months)       Physical Assist : ADLs (physical);Mobility (physical) Mobility (physical): Gait;Transfers;Bed mobility;Stairs   Mobility Comments: [place] ambulator leaning on walls and PRN supervision.       Extremity/Trunk Assessment   Upper Extremity Assessment Upper Extremity Assessment: Generalized weakness    Lower Extremity Assessment Lower Extremity Assessment: Generalized weakness    Cervical / Trunk Assessment Cervical / Trunk Assessment: Normal  Communication   Communication Communication: Hearing impairment  Cognition Arousal: Alert Behavior During Therapy: WFL for tasks assessed/performed Overall Cognitive Status: Within Functional Limits for tasks assessed                                          General Comments      Exercises     Assessment/Plan    PT Assessment Patient needs continued PT services  PT Problem List Decreased activity tolerance;Decreased balance;Decreased mobility;Decreased strength       PT Treatment Interventions DME instruction;Gait training;Stair training;Functional mobility training;Therapeutic activities;Therapeutic exercise;Balance training;Patient/family education    PT Goals (Current goals can be found in the Care Plan section)  Acute Rehab PT Goals Patient Stated Goal: return home with family to assist PT Goal Formulation: With patient Time For Goal Achievement: 02/01/23 Potential to Achieve Goals: Good    Frequency Min 3X/week     Co-evaluation               AM-PAC PT "6 Clicks" Mobility  Outcome Measure Help needed turning from your back to your side while in a flat bed without using bedrails?: None Help needed moving from lying on your back to sitting on the side of a flat bed without using bedrails?: A Little Help needed moving to and from a bed to a chair (including a wheelchair)?: A Little Help needed standing up from a  chair using your arms (e.g., wheelchair or bedside chair)?: A Little Help needed to walk in hospital room?: A Lot Help needed climbing 3-5 steps with a railing? : A Lot 6 Click Score: 17    End of Session   Activity Tolerance: Patient tolerated treatment well;Patient limited by fatigue Patient left: in chair;with call bell/phone within reach Nurse Communication: Mobility status PT Visit Diagnosis: Unsteadiness on feet (R26.81);Other abnormalities of gait and mobility (R26.89);Muscle weakness (generalized) (M62.81)    Time: 1324-4010 PT Time Calculation (min) (ACUTE ONLY): 29 min   Charges:   PT Evaluation $PT Eval Moderate Complexity: 1 Mod PT Treatments $Therapeutic Activity: 23-37 mins PT General Charges $$ ACUTE PT VISIT: 1 Visit         3:17 PM, 01/25/23 Ocie Bob, MPT Physical Therapist with [facility] [phone] office [phone] mobile phone

## 2023-01-25 NOTE — Plan of Care (Signed)
  Problem: Acute Rehab PT Goals(only PT should resolve) Goal: Pt Will Go Supine/Side To Sit Outcome: Progressing Flowsheets (Taken 01/25/2023 1518) Pt will go Supine/Side to Sit:  with supervision  with contact guard assist Goal: Patient Will Transfer Sit To/From Stand Outcome: Progressing Flowsheets (Taken 01/25/2023 1518) Patient will transfer sit to/from stand:  with supervision  with contact guard assist Goal: Pt Will Transfer Bed To Chair/Chair To Bed Outcome: Progressing Flowsheets (Taken 01/25/2023 1518) Pt will Transfer Bed to Chair/Chair to Bed: with contact guard assist Goal: Pt Will Ambulate Outcome: Progressing Flowsheets (Taken 01/25/2023 1518) Pt will Ambulate:  75 feet  with contact guard assist  with rolling walker   3:19 PM, 01/25/23 Ocie Bob, MPT Physical Therapist with [facility] [phone] office [phone] mobile phone

## 2023-01-25 NOTE — Progress Notes (Signed)
PT has remained at 97% on RA since PT has worked with her at 1500.

## 2023-01-25 NOTE — Progress Notes (Signed)
PROGRESS NOTE    Patricia Jackson  ZOX:[id number] DOB: 03/17/1941 DOA: 01/20/2023 PCP: Benita Stabile, MD   Brief Narrative:    Patricia Jackson is a 82 y.o. female with medical history significant for pulmonary fibrosis, COPD, CKD, atrial fibrillation/flutter, GI bleed, hypertension. Patient was brought to the ED with reports of difficulty breathing over the past 3 days, breathing significantly worsened with minimal exertion.  Reports a new cough over the past 4 days that is dry.  No palpitations, no chest pain no lower extremity edema.  She has been compliant with her Eliquis last dose was this morning.  Patient was noted to have atrial fibrillation with RVR and has been started on Cardizem drip and has undergone cardioversion in the ED on 10/18 with conversion to normal sinus rhythm, but has some PACs and ectopic beats at times.  She has now been started on oral diltiazem which has been consolidated, but continues to have some ongoing issues with bronchitis.  She appears to have diuresed adequately.  Oxygen needs to be further weaned if possible and she continues to have significant issues with bronchitis/COPD.  Started on IV Solu-Medrol from prednisone on 10/22.  Cardiology plans to follow-up outpatient.  Assessment & Plan:   Principal Problem:   Atrial fibrillation with RVR (HCC) Active Problems:   Hypertension   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HCC)   Chronic obstructive pulmonary disease (HCC)   Generalized anxiety disorder   Malnutrition of moderate degree  Assessment and Plan:   Atrial fibrillation with RVR (HCC)-now NSR -Appreciate ED and cardiology with successful cardioversion to NSR, but with ongoing ectopic beats and PACs. -Continue oral diltiazem now consolidated to 120 CD, continue to monitor on telemetry -Continue midodrine 2.5mg  TID as blood pressures are improving -Continue Eliquis -No longer requiring diuresis   Generalized anxiety disorder Resume sertraline bupropion    Chronic obstructive pulmonary disease (HCC) History of COPD and pulmonary fibrosis.  Not on home O2.  Chest x-ray suggest pulmonary edema.  Reports new dry cough over the past 4 days.  Was in ED for same- 2 days ago and was started on doxycycline -Continue doxycycline - Resume home BD regimen -Continue mucolytic agents and continue antitussives -Flutter valve and change prednisone to IV Solu-Medrol twice daily   Hypertension -Hold home amlodipine and losartan, and metoprolol -Now on oral Cardizem, monitor carefully -Started on midodrine as noted above, but we need to 2.5 mg 3 times daily -No further need for diuresis at this time    DVT prophylaxis:Eliquis Code Status: Full Family Communication: None at bedside Disposition Plan:  Status is: Inpatient Remains inpatient appropriate because: Need for IV medications and close monitoring  Consultants:  Cardiology now signed off  Procedures:  Synchronized cardioversion 10/18  Antimicrobials:  Anti-infectives (From admission, onward)    Start     Dose/Rate Route Frequency Ordered Stop   01/20/23 2245  doxycycline (VIBRA-TABS) tablet 100 mg       Note to Pharmacy: One po bid x 7 days     100 mg Oral 2 times daily 01/20/23 2241        Subjective: Patient seen and evaluated today with ongoing chest congestion and cough with wheezing noted that has not improved very much.  She becomes quite hypoxemic with ambulation.  Heart rates are stable.  Objective: Vitals:   01/24/23 2045 01/25/23 0300 01/25/23 0429 01/25/23 0728  BP: 125/66  122/75   Pulse: 76  76   Resp: 20  18   Temp: 98.2 F (  36.8 C)  98.5 F (36.9 C)   TempSrc:      SpO2: 97%  98% 98%  Weight:  61.3 kg    Height:        Intake/Output Summary (Last 24 hours) at 01/25/2023 1340 Last data filed at 01/25/2023 1100 Gross per 24 hour  Intake 480 ml  Output 1650 ml  Net -1170 ml   Filed Weights   01/20/23 1355 01/25/23 0300  Weight: 71.2 kg 61.3 kg     Examination:  General exam: Appears calm and comfortable  Respiratory system: Wheezing and congestion bilaterally. Respiratory effort normal.  3.5 L nasal cannula oxygen Cardiovascular system: S1 & S2 heard, RRR.  Gastrointestinal system: Abdomen is soft Central nervous system: Alert and awake Extremities: No edema Skin: No significant lesions noted Psychiatry: Flat affect.    Data Reviewed: I have personally reviewed following labs and imaging studies  CBC: Recent Labs  Lab 01/21/23 0349 01/22/23 0506 01/23/23 0716 01/24/23 0456 01/25/23 0408  WBC 11.2* 7.0 4.9 4.0 8.3  HGB 9.1* 9.2* 9.4* 9.6* 9.3*  HCT 28.6* 29.3* 29.5* 30.2* 30.3*  MCV 90.8 92.4 92.2 91.5 92.1  PLT 123* 113* 121* 135* PLATELET CLUMPS NOTED ON SMEAR, COUNT APPEARS DECREASED   Basic Metabolic Panel: Recent Labs  Lab 01/21/23 0349 01/22/23 0506 01/23/23 0411 01/24/23 0456 01/25/23 0408  NA 136 137 136 134* 136  K 3.6 4.1 4.2 4.7 4.9  CL 109 110 108 109 107  CO2 17* 19* 20* 18* 21*  GLUCOSE 131* 99 86 170* 111*  BUN 28* 35* 30* 30* 46*  CREATININE 1.63* 1.77* 1.52* 1.39* 1.54*  CALCIUM 8.2* 8.3* 8.5* 8.9 9.4  MG  --  2.0 1.9 1.9 2.1   GFR: Estimated Creatinine Clearance: 28.7 mL/min (A) (by C-G formula based on SCr of 1.54 mg/dL (H)). Liver Function Tests: No results for input(s): "AST", "ALT", "ALKPHOS", "BILITOT", "PROT", "ALBUMIN" in the last 168 hours.  No results for input(s): "LIPASE", "AMYLASE" in the last 168 hours. No results for input(s): "AMMONIA" in the last 168 hours. Coagulation Profile: No results for input(s): "INR", "PROTIME" in the last 168 hours.  Cardiac Enzymes: No results for input(s): "CKTOTAL", "CKMB", "CKMBINDEX", "TROPONINI" in the last 168 hours. BNP (last 3 results) No results for input(s): "PROBNP" in the last 8760 hours. HbA1C: No results for input(s): "HGBA1C" in the last 72 hours. CBG: No results for input(s): "GLUCAP" in the last 168 hours. Lipid  Profile: No results for input(s): "CHOL", "HDL", "LDLCALC", "TRIG", "CHOLHDL", "LDLDIRECT" in the last 72 hours. Thyroid Function Tests: No results for input(s): "TSH", "T4TOTAL", "FREET4", "T3FREE", "THYROIDAB" in the last 72 hours. Anemia Panel: No results for input(s): "VITAMINB12", "FOLATE", "FERRITIN", "TIBC", "IRON", "RETICCTPCT" in the last 72 hours. Sepsis Labs: No results for input(s): "PROCALCITON", "LATICACIDVEN" in the last 168 hours.   Recent Results (from the past 240 hour(s))  Resp panel by RT-PCR (RSV, Flu A&B, Covid) Anterior Nasal Swab     Status: None   Collection Time: 01/18/23 12:06 PM   Specimen: Anterior Nasal Swab  Result Value Ref Range Status   SARS Coronavirus 2 by RT PCR NEGATIVE NEGATIVE Final    Comment: (NOTE) SARS-CoV-2 target nucleic acids are NOT DETECTED.  The SARS-CoV-2 RNA is generally detectable in upper respiratory specimens during the acute phase of infection. The lowest concentration of SARS-CoV-2 viral copies this assay can detect is 138 copies/mL. A negative result does not preclude SARS-Cov-2 infection and should not be used as the  sole basis for treatment or other patient management decisions. A negative result may occur with  improper specimen collection/handling, submission of specimen other than nasopharyngeal swab, presence of viral mutation(s) within the areas targeted by this assay, and inadequate number of viral copies(<138 copies/mL). A negative result must be combined with clinical observations, patient history, and epidemiological information. The expected result is Negative.  Fact Sheet for Patients:  BloggerCourse.com  Fact Sheet for Healthcare Providers:  SeriousBroker.it  This test is no t yet approved or cleared by the [place] FDA and  has been authorized for detection and/or diagnosis of SARS-CoV-2 by FDA under an Emergency Use Authorization (EUA). This EUA will  remain  in effect (meaning this test can be used) for the duration of the COVID-19 declaration under Section 564(b)(1) of the Act, 21 U.S.C.section 360bbb-3(b)(1), unless the authorization is terminated  or revoked sooner.       Influenza A by PCR NEGATIVE NEGATIVE Final   Influenza B by PCR NEGATIVE NEGATIVE Final    Comment: (NOTE) The Xpert Xpress SARS-CoV-2/FLU/RSV plus assay is intended as an aid in the diagnosis of influenza from Nasopharyngeal swab specimens and should not be used as a sole basis for treatment. Nasal washings and aspirates are unacceptable for Xpert Xpress SARS-CoV-2/FLU/RSV testing.  Fact Sheet for Patients: BloggerCourse.com  Fact Sheet for Healthcare Providers: SeriousBroker.it  This test is not yet approved or cleared by the [place] FDA and has been authorized for detection and/or diagnosis of SARS-CoV-2 by FDA under an Emergency Use Authorization (EUA). This EUA will remain in effect (meaning this test can be used) for the duration of the COVID-19 declaration under Section 564(b)(1) of the Act, 21 U.S.C. section 360bbb-3(b)(1), unless the authorization is terminated or revoked.     Resp Syncytial Virus by PCR NEGATIVE NEGATIVE Final    Comment: (NOTE) Fact Sheet for Patients: BloggerCourse.com  Fact Sheet for Healthcare Providers: SeriousBroker.it  This test is not yet approved or cleared by the [place] FDA and has been authorized for detection and/or diagnosis of SARS-CoV-2 by FDA under an Emergency Use Authorization (EUA). This EUA will remain in effect (meaning this test can be used) for the duration of the COVID-19 declaration under Section 564(b)(1) of the Act, 21 U.S.C. section 360bbb-3(b)(1), unless the authorization is terminated or revoked.  Performed at [facility], [address], [place], [place] [address]   Culture,  blood (Routine x 2)     Status: None   Collection Time: 01/18/23 12:23 PM   Specimen: BLOOD  Result Value Ref Range Status   Specimen Description BLOOD BLOOD LEFT HAND  Final   Special Requests   Final    BOTTLES DRAWN AEROBIC AND ANAEROBIC Blood Culture results may not be optimal due to an excessive volume of blood received in culture bottles   Culture   Final    NO GROWTH 5 DAYS Performed at [facility], [address] [place], [place] [address]    Report Status 01/23/2023 FINAL  Final  Culture, blood (Routine x 2)     Status: None   Collection Time: 01/18/23 12:27 PM   Specimen: BLOOD  Result Value Ref Range Status   Specimen Description BLOOD BLOOD RIGHT ARM  Final   Special Requests   Final    BOTTLES DRAWN AEROBIC AND ANAEROBIC Blood Culture results may not be optimal due to an excessive volume of blood received in culture bottles   Culture   Final    NO GROWTH  5 DAYS Performed at [facility], [address] [place], [place] [address]    Report Status 01/23/2023 FINAL  Final         Radiology Studies: No results found.      Scheduled Meds:  apixaban  5 mg Oral BID   atropine  1 drop Right Eye BID   benzonatate  100 mg Oral Q8H   budesonide (PULMICORT) nebulizer solution  0.25 mg Nebulization BID   buPROPion  150 mg Oral Daily   dextromethorphan-guaiFENesin  1 tablet Oral BID   diltiazem  120 mg Oral Daily   doxycycline  100 mg Oral BID   feeding supplement  237 mL Oral BID BM   levothyroxine  50 mcg Oral Q0600   methylPREDNISolone (SOLU-MEDROL) injection  40 mg Intravenous Q12H   midodrine  2.5 mg Oral TID WC   multivitamin with minerals  1 tablet Oral Daily   pantoprazole  40 mg Oral BID   polyethylene glycol  17 g Oral Daily   prednisoLONE acetate  1 drop Right Eye BID   rosuvastatin  20 mg Oral Daily   sertraline  25 mg Oral Daily      LOS: 5 days    Time spent: 35 minutes    Alvaretta Eisenberger Hoover Brunette, DO [facility]  If 7PM-7AM, please  contact night-coverage [email or web address] 01/25/2023, 1:40 PM

## 2023-01-25 NOTE — Plan of Care (Signed)

## 2023-01-25 NOTE — TOC Progression Note (Signed)
Transition of Care [facility]) - Progression Note    Patient Details  Name: Patricia Jackson MRN: [id number] Date of Birth: 02/03/1941  Transition of Care [facility] CM/SW Contact  [place] Her[place], [place] Phone Number: 01/25/2023, 1:44 PM  Clinical Narrative:    CSW spoke with pt about PT recommendation for [facility] PT in the home at D/C. Pt states that she has someone in the home with her at all times and she does not feel this is necessary. CSW explained that if she changes her mind once she discharges home she can follow up with her PCP to get [facility] services. CSW spoke to Arley with [employer] about the rollator that was ordered, at this time pt is not eligible due to other DME she has gotten through her medicare, CSW explained private pay cost with pt and she plans to get assistance from grandson in getting on herself. Pt has rolling walker and wheelchair in the home. TOC to follow.   Expected Discharge Plan: Home w Home Health Services Barriers to Discharge: Continued Medical Work up  Expected Discharge Plan and Services In-house Referral: Clinical Social Work Discharge Planning Services: CM Consult Post Acute Care Choice: Home Health Living arrangements for the past 2 months: Single Family Home                                       Social Determinants of Health (SDOH) Interventions SDOH Screenings   Food Insecurity: No Food Insecurity (01/21/2023)  Housing: Low Risk  (01/21/2023)  Transportation Needs: No Transportation Needs (01/21/2023)  Utilities: Not At Risk (01/21/2023)  Depression (PHQ2-9): Low Risk  (11/26/2022)  Financial Resource Strain: Low Risk  (11/26/2022)  Social Connections: Moderately Integrated (11/26/2022)  Stress: No Stress Concern Present (11/26/2022)  Tobacco Use: Medium Risk (01/20/2023)  Health Literacy: Adequate Health Literacy (11/26/2022)    Readmission Risk Interventions    01/21/2023    1:10 PM 06/22/2021   12:59 PM  Readmission Risk Prevention Plan   Transportation Screening Complete Complete  PCP or Specialist Appt within 5-7 Days  Complete  Home Care Screening  Complete  Medication Review (RN CM)  Complete  Medication Review (RN Care Manager) Complete   HRI or Home Care Consult Complete   SW Recovery Care/Counseling Consult Complete   Palliative Care Screening Not Applicable   Skilled Nursing Facility Not Applicable

## 2023-01-25 NOTE — Progress Notes (Addendum)
   Patient Name: Patricia Jackson Date of Encounter: 01/25/2023 [facility] Cardiologist: Dina Rich, MD    Interval Summary  .    Short of breath and drop in O2 just walking in room.  Vital Signs .    Vitals:   01/24/23 2045 01/25/23 0300 01/25/23 0429 01/25/23 0728  BP: 125/66  122/75   Pulse: 76  76   Resp: 20  18   Temp: 98.2 F (36.8 C)  98.5 F (36.9 C)   TempSrc:      SpO2: 97%  98% 98%  Weight:  61.3 kg    Height:        Intake/Output Summary (Last 24 hours) at 01/25/2023 0935 Last data filed at 01/25/2023 0300 Gross per 24 hour  Intake 240 ml  Output 700 ml  Net -460 ml      01/25/2023    3:00 AM 01/20/2023    1:55 PM 01/18/2023   12:04 PM  Last 3 Weights  Weight (lbs) 135 lb 2.3 oz 156 lb 15.5 oz 157 lb  Weight (kg) 61.3 kg 71.2 kg 71.215 kg      Telemetry/ECG    NSR - Personally Reviewed  Physical Exam .   GEN: No acute distress.   Neck: No JVD Cardiac:  RRR, no murmurs, rubs, or gallops.  Respiratory: decreased breath sounds with crackles and wheezing left lung base GI: Soft, nontender, non-distended  MS: No edema  Assessment & Plan .    PAF:  cardioverted in ER 01/21/23 on eliquis and oral cardizem 120 mg daily. Will arrange outpatient f/u and sign off.   CAD DES to LCX 2017 stable no angina EF normal on echo 09/2022  HLD:  continue statin  Bronchitis:  per primary team,on prednisone and doxycycline  CXR 10/17 COPD continue nebs and inhalers. Still DOE and drop in O2 with little activity.  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 Found to be in aflutter with RVR.  -  11/20/22 EKG NSR - 01/18/23 ER visit with bronchitis, perhaps exacerbating cause for her recurrent aflutter.  - started on dilt gtt initially, soft bp's and limited rate control. Tried on IV amio but complained of infusion site pain and discontinued. Chronic lung disease long term amio would not be great option for her. CAD and labile renal function an alternative antiarrhythmic would also be limited.   - cardioverted in ER 01/21/23, has maintained SR since - continue dilt 120mg , had started midodrine initially due to soft bp's but improved and will wean midodrine to 2.5mg  tid, likely can d/c at outpatient f/u.    2. COPD exacerbation - per primary team   We will sign off inpatient care, she has f/u arranged for November.

## 2023-01-26 DIAGNOSIS — I4891 Unspecified atrial fibrillation: Secondary | ICD-10-CM | POA: Diagnosis not present

## 2023-01-26 LAB — BASIC METABOLIC PANEL
Anion gap: 8 (ref 5–15)
BUN: 52 mg/dL — ABNORMAL HIGH (ref 8–23)
CO2: 21 mmol/L — ABNORMAL LOW (ref 22–32)
Calcium: 9.4 mg/dL (ref 8.9–10.3)
Chloride: 104 mmol/L (ref 98–111)
Creatinine, Ser: 1.62 mg/dL — ABNORMAL HIGH (ref 0.44–1.00)
GFR, Estimated: 32 mL/min — ABNORMAL LOW (ref >60)
Glucose, Bld: 166 mg/dL — ABNORMAL HIGH (ref 70–99)
Potassium: 5.2 mmol/L — ABNORMAL HIGH (ref 3.5–5.1)
Sodium: 133 mmol/L — ABNORMAL LOW (ref 135–145)

## 2023-01-26 LAB — CBC
HCT: 31 % — ABNORMAL LOW (ref 36.0–46.0)
Hemoglobin: 9.9 g/dL — ABNORMAL LOW (ref 12.0–15.0)
MCH: 29.2 pg (ref 26.0–34.0)
MCHC: 31.9 g/dL (ref 30.0–36.0)
MCV: 91.4 fL (ref 80.0–100.0)
Platelets: 155 10*3/uL (ref 150–400)
RBC: 3.39 MIL/uL — ABNORMAL LOW (ref 3.87–5.11)
RDW: 16.6 % — ABNORMAL HIGH (ref 11.5–15.5)
WBC: 6.1 10*3/uL (ref 4.0–10.5)
nRBC: 0 % (ref 0.0–0.2)

## 2023-01-26 LAB — MAGNESIUM: Magnesium: 2.1 mg/dL (ref 1.7–2.4)

## 2023-01-26 MED ORDER — SODIUM ZIRCONIUM CYCLOSILICATE 5 G PO PACK
5.0000 g | PACK | Freq: Once | ORAL | Status: AC
  Administered 2023-01-26: 5 g via ORAL
  Filled 2023-01-26: qty 1

## 2023-01-26 MED ORDER — PREDNISONE 20 MG PO TABS
40.0000 mg | ORAL_TABLET | Freq: Every day | ORAL | Status: DC
  Administered 2023-01-27: 40 mg via ORAL
  Filled 2023-01-26: qty 2

## 2023-01-26 NOTE — Progress Notes (Signed)
Patient Saturations on Room Air at Rest = 91%  Patient Saturations on Room Air while Ambulating = 95%  Patient Saturations on 2 Liters of oxygen while Ambulating = 97%

## 2023-01-26 NOTE — Progress Notes (Signed)
[facility] PROGRESS NOTE  Patricia Jackson (DOB: 1940/06/20) EXB:[id number] PCP: Benita Stabile, MD  Brief Narrative: Patricia Jackson is a 82 y.o. female with medical history significant for pulmonary fibrosis, COPD, CKD, atrial fibrillation/flutter, GI bleed, hypertension who presented to the ED 10/17 with dyspnea, new nonproductive cough over the past 4 days. Patient was noted to have atrial fibrillation with RVR and has been started on Cardizem drip and has undergone cardioversion in the ED on 10/18 with conversion to normal sinus rhythm, but has some PACs and ectopic beats at times.  She has now been started on oral diltiazem which has been consolidated, but continues to have some ongoing issues with bronchitis.  She appears to have diuresed adequately.  Oxygen needs to be further weaned if possible and she continues to have significant issues with bronchitis/COPD.  Started on IV Solu-Medrol from prednisone on 10/22.  Cardiology plans to follow-up outpatient. Her exam has improved on IV steroids, will attempt oxygen weaning.   Subjective: Breathing better today, but has always felt fine at rest, wants to try getting up. No more wheezing today.  Objective: BP (!) 104/56 (BP Location: Right Arm)   Pulse 71   Temp 98.4 F (36.9 C)   Resp (!) 21   Ht 5\' 7"  (1.702 m)   Wt 61.3 kg   SpO2 96%   BMI 21.17 kg/m   Gen: Frail elderly female in no distress Pulm: Diminished, crackles diffusely, no wheezes. Nonlabored  CV: RRR, no MRG or pitting edema GI: Soft, NT, ND, +BS  Neuro: Alert and oriented. No new focal deficits. Ext: Warm, no deformities. Skin: No acute rashes, lesions or ulcers on visualized skin   Assessment & Plan: PAF with RVR: Maintaining NSR s/p DCCV 10/18.  - Appreciate cardiology consultation, have signed off as of 10/22.  - Continue diltiazem 120mg  daily - Continue eliquis (strict adherence needed)   Acute hypoxic respiratory failure: Due initially to acute pulmonary  edema, later due to AECOPD, pulmonary fibrosis (diminished pulmonary reserve) and atelectasis.  - Tx AECOPD as below.  - Order incentive spirometry as atelectasis is definitely contributing somewhat.  - Ambulatory pulse oximetry today / prior to discharge (not previously on O2).  - Maintain euvolemia.   CAD: s/p DES to LCx 2017.  - Anticoagulation as below, holding BB with soft BP. Continue statin.   AECOPD:  - Complete course of doxycycline - Taper steroids back to prednisone and monitor closely - Continue bronchodilators.  - Continue mucolytic/antitussive.     Hyperkalemia:  - Lokelma x1  Stage IIIb CKD: Based on current CrCl.  - Avoid nephrotoxins.   Thrombocytopenia: Resolved.   Hypertension - BP actually soft, so we're holding home amlodipine, losartan, metoprolol - Continue new midodrine.   GAD:  - Continue sertraline, bupropion.   Tyrone Nine, MD [facility] [email or web address] 01/26/2023, 2:36 PM

## 2023-01-26 NOTE — Progress Notes (Signed)
Mobility Specialist Progress Note:    01/26/23 1330  Mobility  Activity Ambulated with assistance in hallway  Level of Assistance Minimal assist, patient does 75% or more  Assistive Device Front wheel walker  Distance Ambulated (ft) 70 ft  Range of Motion/Exercises Active;All extremities  Activity Response Tolerated well  Mobility Referral Yes  $Mobility charge 1 Mobility  Mobility Specialist Start Time (ACUTE ONLY) 1330  Mobility Specialist Stop Time (ACUTE ONLY) 1345  Mobility Specialist Time Calculation (min) (ACUTE ONLY) 15 min   Pt received in bed, agreeable to mobility. Required MinA to stand and CGA to ambulate with RW. Tolerated well, SpO2 93% on RA throughout session but audible SOB. Pt c/o SOB and coughing. Returned pt to room, left in chair. Call bell in reach, all needs met.   Lawerance Bach Mobility Specialist Please contact via Special educational needs teacher or  Rehab office at [phone]

## 2023-01-27 ENCOUNTER — Other Ambulatory Visit (HOSPITAL_COMMUNITY): Payer: 59

## 2023-01-27 DIAGNOSIS — I4891 Unspecified atrial fibrillation: Secondary | ICD-10-CM | POA: Diagnosis not present

## 2023-01-27 LAB — BASIC METABOLIC PANEL
Anion gap: 12 (ref 5–15)
BUN: 57 mg/dL — ABNORMAL HIGH (ref 8–23)
CO2: 20 mmol/L — ABNORMAL LOW (ref 22–32)
Calcium: 9.2 mg/dL (ref 8.9–10.3)
Chloride: 102 mmol/L (ref 98–111)
Creatinine, Ser: 1.56 mg/dL — ABNORMAL HIGH (ref 0.44–1.00)
GFR, Estimated: 34 mL/min — ABNORMAL LOW (ref >60)
Glucose, Bld: 96 mg/dL (ref 70–99)
Potassium: 5.3 mmol/L — ABNORMAL HIGH (ref 3.5–5.1)
Sodium: 134 mmol/L — ABNORMAL LOW (ref 135–145)

## 2023-01-27 MED ORDER — PREDNISONE 10 MG PO TABS: ORAL_TABLET | ORAL | 0 refills | Status: AC

## 2023-01-27 MED ORDER — DILTIAZEM HCL ER COATED BEADS 120 MG PO CP24: 120.0000 mg | ORAL_CAPSULE | Freq: Every day | ORAL | 0 refills | Status: DC

## 2023-01-27 MED ORDER — SODIUM ZIRCONIUM CYCLOSILICATE 5 G PO PACK: 5.0000 g | PACK | Freq: Once | ORAL | Status: DC

## 2023-01-27 NOTE — Care Management Important Message (Signed)
Important Message  Patient Details  Name: Patricia Jackson MRN: [id number] Date of Birth: 12-09-1940   Important Message Given:  Yes - Medicare IM     Corey Harold 01/27/2023, 12:29 PM

## 2023-01-27 NOTE — Progress Notes (Addendum)
Mobility Specialist Progress Note:    01/27/23 0930  Mobility  Activity Ambulated with assistance in hallway  Level of Assistance Minimal assist, patient does 75% or more  Assistive Device Front wheel walker  Distance Ambulated (ft) 60 ft  Range of Motion/Exercises Active;All extremities  Activity Response Tolerated well  Mobility Referral Yes  $Mobility charge 1 Mobility  Mobility Specialist Start Time (ACUTE ONLY) 0915  Mobility Specialist Stop Time (ACUTE ONLY) 0935  Mobility Specialist Time Calculation (min) (ACUTE ONLY) 20 min   Pt received in bed, agreeable to mobility. Required MinA to stand and CGA to ambulate with RW. Tolerated well, pt had nose bleeding before and after session. RN assisted to clean pt up. Audible SOB throughout session, SpO2 97% on RA. Returned to room, left in chair. Alarm on, call bell in reach. All needs met.   Lawerance Bach Mobility Specialist Please contact via Special educational needs teacher or  Rehab office at [phone]

## 2023-01-27 NOTE — Discharge Summary (Signed)
Physician Discharge Summary   Patient: Patricia Jackson MRN: [id number] DOB: [birth year]  Admit date:     01/20/2023  Discharge date: 01/27/23  Discharge Physician: Tyrone Nine   PCP: Benita Stabile, MD   Recommendations at discharge:  Follow up with PCP in 1-2 weeks with repeat BMP Follow up as scheduled with cardiology 11/20 with plans to review blood pressure lob and consider reinitiation of medications (holding norvasc/ARB/metoprolol, started diltiazem).  Discharge Diagnoses: Principal Problem:   Atrial fibrillation with RVR (HCC) Active Problems:   Hypertension   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HCC)   Chronic obstructive pulmonary disease (HCC)   Generalized anxiety disorder   Malnutrition of moderate degree  Hospital Course: Patricia Jackson is a 82 y.o. female with medical history significant for pulmonary fibrosis, COPD, CKD, atrial fibrillation/flutter, GI bleed, hypertension who presented to the ED 10/17 with dyspnea, new nonproductive cough over the past 4 days. Patient was noted to have atrial fibrillation with RVR and has been started on Cardizem drip and has undergone cardioversion in the ED on 10/18 with conversion to normal sinus rhythm, but has some PACs and ectopic beats at times.  She has now been started on oral diltiazem which has been consolidated, but continues to have some ongoing issues with bronchitis.  She appears to have diuresed adequately.  Oxygen needs to be further weaned if possible and she continues to have significant issues with bronchitis/COPD.  Started on IV Solu-Medrol from prednisone on 10/22.  Cardiology plans to follow-up outpatient. Her exam has improved on steroids and she is no longer hypoxemic. She is medically stable for discharge with plan outlined below.  Assessment and Plan: PAF with RVR: Maintaining NSR s/p DCCV 10/18.  - Appreciate cardiology consultation, have signed off as of 10/22. Have secured follow up 11/20, advise to continue holding metoprolol  and norvasc/ARB until follow up.  - Continue diltiazem 120mg  daily - Continue eliquis (strict adherence needed)   Acute hypoxic respiratory failure: Due initially to acute pulmonary edema, later due to AECOPD, pulmonary fibrosis (diminished pulmonary reserve) and atelectasis.  - Tx AECOPD as below.  - Continue incentive spirometry as atelectasis is definitely contributing. - Ambulatory pulse oximetry >> no hypoxemia. - Maintain euvolemia.    CAD: s/p DES to LCx 2017.  - Anticoagulation as below, holding BB with soft BP. Continue statin.    AECOPD:  - Completed course of doxycycline - Taper steroids over next 4 days. - Continue bronchodilators.  - Continue mucolytic/antitussive.     Hyperkalemia: Will give lokelma, advice low potassium diet, hold ARB. Recheck at follow up. No changes on telemetry.    Stage IIIb CKD: Based on current CrCl.  - Avoid nephrotoxins.    Thrombocytopenia: Resolved.    Hypertension - BP actually soft, so we're holding home amlodipine, losartan, metoprolol - No longer requires midodrine.    GAD:  - Continue sertraline, bupropion.   Moderate protein calorie malnutrition: Supplement protein.   Consultants: Cardiology Procedures performed: None  Disposition: Home Diet recommendation:  Cardiac diet DISCHARGE MEDICATION: Allergies as of 01/27/2023       Reactions   Povidone-iodine Other (See Comments)   Burning   Sulfa Antibiotics    Unknown reaction    Sulfamethoxazole    Other Reaction(s): Not available        Medication List     STOP taking these medications    amLODipine-olmesartan 10-40 MG tablet Commonly known as: AZOR   doxycycline 100 MG capsule Commonly known as: VIBRAMYCIN  metoprolol succinate 25 MG 24 hr tablet Commonly known as: Toprol XL       TAKE these medications    acetaminophen 500 MG tablet Commonly known as: TYLENOL Take 1,000 mg by mouth every 8 (eight) hours as needed for mild pain or moderate  pain.   albuterol (2.5 MG/3ML) 0.083% nebulizer solution Commonly known as: PROVENTIL Take 2.5 mg by nebulization every 6 (six) hours as needed for wheezing or shortness of breath.   apixaban 5 MG Tabs tablet Commonly known as: Eliquis Take 1 tablet (5 mg total) by mouth 2 (two) times daily.   atropine 1 % ophthalmic solution Place 1 drop into both eyes 3 (three) times daily.   benzonatate 100 MG capsule Commonly known as: TESSALON Take 1 capsule (100 mg total) by mouth every 8 (eight) hours.   buPROPion 150 MG 24 hr tablet Commonly known as: WELLBUTRIN XL Take 150 mg by mouth daily.   COPPER CAPS PO Take 6 mg by mouth daily.   denosumab 60 MG/ML Sosy injection Commonly known as: PROLIA Inject 60 mg into the skin every 6 (six) months.   diltiazem 120 MG 24 hr capsule Commonly known as: CARDIZEM CD Take 1 capsule (120 mg total) by mouth daily. Start taking on: January 28, 2023   ferrous sulfate 325 (65 FE) MG EC tablet Take 1 tablet (325 mg total) by mouth daily with breakfast.   levothyroxine 50 MCG tablet Commonly known as: SYNTHROID Take 50 mcg by mouth daily before breakfast.   nitroGLYCERIN 0.4 MG SL tablet Commonly known as: NITROSTAT Place 0.4 mg under the tongue every 5 (five) minutes as needed for chest pain.   pantoprazole 40 MG tablet Commonly known as: Protonix Take 1 tablet (40 mg total) by mouth 2 (two) times daily.   prednisoLONE acetate 1 % ophthalmic suspension Commonly known as: PRED FORTE Place 1 drop into the right eye in the morning and at bedtime.   predniSONE 10 MG tablet Commonly known as: DELTASONE Take 4 tablets (40 mg total) by mouth daily with breakfast for 1 day, THEN 3 tablets (30 mg total) daily with breakfast for 1 day, THEN 2 tablets (20 mg total) daily with breakfast for 1 day, THEN 1 tablet (10 mg total) daily with breakfast for 1 day. Start taking on: January 28, 2023   rosuvastatin 20 MG tablet Commonly known as:  CRESTOR Take 20 mg by mouth daily.   sertraline 25 MG tablet Commonly known as: ZOLOFT Take 25 mg by mouth daily.   Trelegy Ellipta 200-62.5-25 MCG/ACT Aepb Generic drug: Fluticasone-Umeclidin-Vilant Inhale 1 puff into the lungs daily.   Vitamin D3 50 MCG (2000 UT) Tabs Take 2,000 Units by mouth daily.               Durable Medical Equipment  (From admission, onward)           Start     Ordered   01/21/23 1322  For home use only DME 4 wheeled rolling walker with seat  Once       Question:  Patient needs a walker to treat with the following condition  Answer:  Weakness   01/21/23 1322            Follow-up Information     Benita Stabile, MD Follow up.   Specialty: Internal Medicine Contact information: [address] [place] [address] [phone]         Ellsworth Lennox, PA-C Follow up on 02/23/2023.  Specialties: Cardiology, Cardiology Contact information: [address] [place] [address] [phone]                Discharge Exam: Ceasar Mons Weights   01/20/23 1355 01/25/23 0300 01/27/23 0517  Weight: 71.2 kg 61.3 kg 60.3 kg  BP 131/73 (BP Location: Right Arm)   Pulse 69   Temp (!) 97.3 F (36.3 C) (Oral)   Resp (!) 21   Ht 5\' 7"  (1.702 m)   Wt 60.3 kg Comment: 133lbs  SpO2 98%   BMI 20.83 kg/m   No distress, pleasant Clear, nonlabored without crackles or wheezes RRR, NSR on monitor, no MRG or edema  Condition at discharge: stable  The results of significant diagnostics from this hospitalization (including imaging, microbiology, ancillary and laboratory) are listed below for reference.   Imaging Studies: US RENAL  Result Date: 01/25/2023 CLINICAL DATA:  Chronic kidney disease EXAM: RENAL / URINARY TRACT ULTRASOUND COMPLETE COMPARISON:  None Available. FINDINGS: Right Kidney: Renal measurements: 9.1 x 4.1 x 3.8 cm = volume: 74 mL. Echogenicity within normal limits. No focal mass. Mild caliectasis. Left Kidney: Renal  measurements: 9.7 x 4.4 x 4.3 cm = volume: 95 mL. Echogenicity within normal limits. No hydronephrosis. There is a 1.2 x 0.9 x 1.3 cm cyst in the left kidney. Bladder: Appears normal for degree of bladder distention. Other: None. IMPRESSION: 1. Mild caliectasis on the right. 2. 1.3 cm cyst in the left kidney. Electronically Signed   By: Darliss Cheney M.D.   On: 01/25/2023 18:09   DG Chest Portable 1 View  Result Date: 01/20/2023 CLINICAL DATA:  sob, tachycardia EXAM: PORTABLE CHEST 1 VIEW COMPARISON:  01/18/2023 FINDINGS: Bilateral lungs appear hyperexpanded and hyperlucent with coarse bronchovascular markings, in keeping with COPD. There are increased interstitial markings when compared to the prior exam, suggesting underlying mild pulmonary edema. There are probable atelectasis/scarring at the lung bases. Bilateral lungs otherwise appear clear. No acute consolidation or lung collapse. Bilateral costophrenic angles are clear. Stable cardio-mediastinal silhouette. No acute osseous abnormalities. The soft tissues are within normal limits. IMPRESSION: 1. COPD. 2. Suspected mild superimposed pulmonary edema. Electronically Signed   By: Jules Schick M.D.   On: 01/20/2023 16:27   DG Chest Port 1 View  Result Date: 01/18/2023 CLINICAL DATA:  Sepsis cough EXAM: PORTABLE CHEST 1 VIEW COMPARISON:  11/20/2022 FINDINGS: Emphysema and bronchitic changes. No acute airspace disease or effusion. Stable cardiomediastinal silhouette with aortic atherosclerosis. No pneumothorax IMPRESSION: Emphysema and bronchitic changes. Electronically Signed   By: Jasmine Pang M.D.   On: 01/18/2023 15:21    Microbiology: Results for orders placed or performed during the hospital encounter of 01/18/23  Resp panel by RT-PCR (RSV, Flu A&B, Covid) Anterior Nasal Swab     Status: None   Collection Time: 01/18/23 12:06 PM   Specimen: Anterior Nasal Swab  Result Value Ref Range Status   SARS Coronavirus 2 by RT PCR NEGATIVE NEGATIVE  Final    Comment: (NOTE) SARS-CoV-2 target nucleic acids are NOT DETECTED.  The SARS-CoV-2 RNA is generally detectable in upper respiratory specimens during the acute phase of infection. The lowest concentration of SARS-CoV-2 viral copies this assay can detect is 138 copies/mL. A negative result does not preclude SARS-Cov-2 infection and should not be used as the sole basis for treatment or other patient management decisions. A negative result may occur with  improper specimen collection/handling, submission of specimen other than nasopharyngeal swab, presence of viral mutation(s) within the areas targeted by  this assay, and inadequate number of viral copies(<138 copies/mL). A negative result must be combined with clinical observations, patient history, and epidemiological information. The expected result is Negative.  Fact Sheet for Patients:  BloggerCourse.com  Fact Sheet for Healthcare Providers:  SeriousBroker.it  This test is no t yet approved or cleared by the [place] FDA and  has been authorized for detection and/or diagnosis of SARS-CoV-2 by FDA under an Emergency Use Authorization (EUA). This EUA will remain  in effect (meaning this test can be used) for the duration of the COVID-19 declaration under Section 564(b)(1) of the Act, 21 U.S.C.section 360bbb-3(b)(1), unless the authorization is terminated  or revoked sooner.       Influenza A by PCR NEGATIVE NEGATIVE Final   Influenza B by PCR NEGATIVE NEGATIVE Final    Comment: (NOTE) The Xpert Xpress SARS-CoV-2/FLU/RSV plus assay is intended as an aid in the diagnosis of influenza from Nasopharyngeal swab specimens and should not be used as a sole basis for treatment. Nasal washings and aspirates are unacceptable for Xpert Xpress SARS-CoV-2/FLU/RSV testing.  Fact Sheet for Patients: BloggerCourse.com  Fact Sheet for Healthcare  Providers: SeriousBroker.it  This test is not yet approved or cleared by the [place] FDA and has been authorized for detection and/or diagnosis of SARS-CoV-2 by FDA under an Emergency Use Authorization (EUA). This EUA will remain in effect (meaning this test can be used) for the duration of the COVID-19 declaration under Section 564(b)(1) of the Act, 21 U.S.C. section 360bbb-3(b)(1), unless the authorization is terminated or revoked.     Resp Syncytial Virus by PCR NEGATIVE NEGATIVE Final    Comment: (NOTE) Fact Sheet for Patients: BloggerCourse.com  Fact Sheet for Healthcare Providers: SeriousBroker.it  This test is not yet approved or cleared by the [place] FDA and has been authorized for detection and/or diagnosis of SARS-CoV-2 by FDA under an Emergency Use Authorization (EUA). This EUA will remain in effect (meaning this test can be used) for the duration of the COVID-19 declaration under Section 564(b)(1) of the Act, 21 U.S.C. section 360bbb-3(b)(1), unless the authorization is terminated or revoked.  Performed at [facility], [address], [place], [place] [address]   Culture, blood (Routine x 2)     Status: None   Collection Time: 01/18/23 12:23 PM   Specimen: BLOOD  Result Value Ref Range Status   Specimen Description BLOOD BLOOD LEFT HAND  Final   Special Requests   Final    BOTTLES DRAWN AEROBIC AND ANAEROBIC Blood Culture results may not be optimal due to an excessive volume of blood received in culture bottles   Culture   Final    NO GROWTH 5 DAYS Performed at [facility], [address] [place], [place] [address]    Report Status 01/23/2023 FINAL  Final  Culture, blood (Routine x 2)     Status: None   Collection Time: 01/18/23 12:27 PM   Specimen: BLOOD  Result Value Ref Range Status   Specimen Description BLOOD BLOOD RIGHT ARM  Final   Special Requests   Final     BOTTLES DRAWN AEROBIC AND ANAEROBIC Blood Culture results may not be optimal due to an excessive volume of blood received in culture bottles   Culture   Final    NO GROWTH 5 DAYS Performed at [facility], [address], [place], [place] [address]    Report Status 01/23/2023 FINAL  Final    Labs: CBC: Recent Labs  Lab 01/22/23 0506 01/23/23 0716  01/24/23 0456 01/25/23 0408 01/26/23 0415  WBC 7.0 4.9 4.0 8.3 6.1  HGB 9.2* 9.4* 9.6* 9.3* 9.9*  HCT 29.3* 29.5* 30.2* 30.3* 31.0*  MCV 92.4 92.2 91.5 92.1 91.4  PLT 113* 121* 135* PLATELET CLUMPS NOTED ON SMEAR, COUNT APPEARS DECREASED 155   Basic Metabolic Panel: Recent Labs  Lab 01/22/23 0506 01/23/23 0411 01/24/23 0456 01/25/23 0408 01/26/23 0415 01/27/23 0802  NA 137 136 134* 136 133* 134*  K 4.1 4.2 4.7 4.9 5.2* 5.3*  CL 110 108 109 107 104 102  CO2 19* 20* 18* 21* 21* 20*  GLUCOSE 99 86 170* 111* 166* 96  BUN 35* 30* 30* 46* 52* 57*  CREATININE 1.77* 1.52* 1.39* 1.54* 1.62* 1.56*  CALCIUM 8.3* 8.5* 8.9 9.4 9.4 9.2  MG 2.0 1.9 1.9 2.1 2.1  --    Liver Function Tests: No results for input(s): "AST", "ALT", "ALKPHOS", "BILITOT", "PROT", "ALBUMIN" in the last 168 hours. CBG: No results for input(s): "GLUCAP" in the last 168 hours.  Discharge time spent: greater than 30 minutes.  Signed: Tyrone Nine, MD [facility] 01/27/2023

## 2023-01-27 NOTE — TOC Transition Note (Signed)
Transition of Care [facility]) - CM/SW Discharge Note   Patient Details  Name: Patricia Jackson MRN: [id number] Date of Birth: 1940/06/25  [facility]) CM/SW Contact:  Villa Herb, LCSWA Phone Number: 01/27/2023, 10:23 AM   Clinical Narrative:    CSW updated pt that pts insurance will not cover a rollator due to having gotten a wheelchair within the lat 5 years. CSW spoke with pt about this and she will work with her family to get one ordered through an outside agency. Pt declines HH referral and feels this is not needed. CSW explained that she can reach out to her PCP if she would like HH set up once back home. TOC signing off.   Final next level of care: Home/Self Care Barriers to Discharge: Barriers Resolved   Patient Goals and CMS Choice CMS Medicare.gov Compare Post Acute Care list provided to:: Patient Choice offered to / list presented to : Patient  Discharge Placement                         Discharge Plan and Services Additional resources added to the After Visit Summary for   In-house Referral: Clinical Social Work Discharge Planning Services: CM Consult Post Acute Care Choice: Home Health                               Social Determinants of Health (SDOH) Interventions SDOH Screenings   Food Insecurity: No Food Insecurity (01/21/2023)  Housing: Low Risk  (01/21/2023)  Transportation Needs: No Transportation Needs (01/21/2023)  Utilities: Not At Risk (01/21/2023)  Depression (PHQ2-9): Low Risk  (11/26/2022)  Financial Resource Strain: Low Risk  (11/26/2022)  Social Connections: Moderately Integrated (11/26/2022)  Stress: No Stress Concern Present (11/26/2022)  Tobacco Use: Medium Risk (01/20/2023)  Health Literacy: Adequate Health Literacy (11/26/2022)     Readmission Risk Interventions    01/21/2023    1:10 PM 06/22/2021   12:59 PM  Readmission Risk Prevention Plan  Transportation Screening Complete Complete  PCP or Specialist Appt  within 5-7 Days  Complete  Home Care Screening  Complete  Medication Review (RN CM)  Complete  Medication Review (RN Care Manager) Complete   HRI or Home Care Consult Complete   SW Recovery Care/Counseling Consult Complete   Palliative Care Screening Not Applicable   Skilled Nursing Facility Not Applicable

## 2023-01-28 ENCOUNTER — Ambulatory Visit (HOSPITAL_COMMUNITY): Admit: 2023-01-28 | Payer: 59

## 2023-01-28 ENCOUNTER — Encounter (HOSPITAL_COMMUNITY): Payer: Self-pay

## 2023-01-28 NOTE — Consult Note (Signed)
[facility] Inpatient Consult   01/28/2023  Maudell Korby 18-Apr-1940 [id number]  Primary Care Provider:  Nita Sells  Patient is currently active with Care Management for chronic disease management services.  Patient has been engaged by a Charity fundraiser.  Our community based plan of care has focused on disease management and community resource support.   Patient will receive a post hospital call and will be evaluated for assessments and disease process education.    Of note, Care Management services does not replace or interfere with any services that are needed or arranged by inpatient [facility] care management team.   For additional questions or referrals please contact:  Elliot Cousin, RN, [facility] Liaison [place]   [facility], Population Health Office Hours MTWF  8:00 am-6:00 pm Direct Dial: [phone] mobile [phone] [Office toll free line] Office Hours are M-F 8:30 - 5 pm [email or web address]

## 2023-02-02 ENCOUNTER — Encounter (HOSPITAL_COMMUNITY): Payer: Self-pay | Admitting: Emergency Medicine

## 2023-02-02 ENCOUNTER — Emergency Department (HOSPITAL_COMMUNITY): Payer: 59

## 2023-02-02 ENCOUNTER — Other Ambulatory Visit: Payer: Self-pay

## 2023-02-02 ENCOUNTER — Other Ambulatory Visit (HOSPITAL_COMMUNITY): Payer: 59

## 2023-02-02 ENCOUNTER — Inpatient Hospital Stay (HOSPITAL_COMMUNITY)
Admission: EM | Admit: 2023-02-02 | Discharge: 2023-02-05 | DRG: 309 | Disposition: A | Discharge status: Home / Self Care | Payer: 59 | Attending: Internal Medicine | Admitting: Internal Medicine

## 2023-02-02 DIAGNOSIS — E039 Hypothyroidism, unspecified: Secondary | ICD-10-CM

## 2023-02-02 DIAGNOSIS — R0602 Shortness of breath: Secondary | ICD-10-CM

## 2023-02-02 DIAGNOSIS — R739 Hyperglycemia, unspecified: Secondary | ICD-10-CM

## 2023-02-02 DIAGNOSIS — I2489 Other forms of acute ischemic heart disease: Secondary | ICD-10-CM | POA: Diagnosis present

## 2023-02-02 DIAGNOSIS — Z853 Personal history of malignant neoplasm of breast: Secondary | ICD-10-CM

## 2023-02-02 DIAGNOSIS — J441 Chronic obstructive pulmonary disease with (acute) exacerbation: Secondary | ICD-10-CM | POA: Diagnosis not present

## 2023-02-02 DIAGNOSIS — Z1152 Encounter for screening for COVID-19: Secondary | ICD-10-CM | POA: Diagnosis not present

## 2023-02-02 DIAGNOSIS — I252 Old myocardial infarction: Secondary | ICD-10-CM

## 2023-02-02 DIAGNOSIS — Z79899 Other long term (current) drug therapy: Secondary | ICD-10-CM | POA: Diagnosis not present

## 2023-02-02 DIAGNOSIS — I340 Nonrheumatic mitral (valve) insufficiency: Secondary | ICD-10-CM | POA: Diagnosis not present

## 2023-02-02 DIAGNOSIS — N179 Acute kidney failure, unspecified: Secondary | ICD-10-CM | POA: Diagnosis not present

## 2023-02-02 DIAGNOSIS — Z8701 Personal history of pneumonia (recurrent): Secondary | ICD-10-CM

## 2023-02-02 DIAGNOSIS — N184 Chronic kidney disease, stage 4 (severe): Secondary | ICD-10-CM | POA: Diagnosis not present

## 2023-02-02 DIAGNOSIS — Z7901 Long term (current) use of anticoagulants: Secondary | ICD-10-CM

## 2023-02-02 DIAGNOSIS — D509 Iron deficiency anemia, unspecified: Secondary | ICD-10-CM | POA: Diagnosis present

## 2023-02-02 DIAGNOSIS — I10 Essential (primary) hypertension: Secondary | ICD-10-CM

## 2023-02-02 DIAGNOSIS — I2510 Atherosclerotic heart disease of native coronary artery without angina pectoris: Secondary | ICD-10-CM | POA: Diagnosis not present

## 2023-02-02 DIAGNOSIS — Z79890 Hormone replacement therapy (postmenopausal): Secondary | ICD-10-CM

## 2023-02-02 DIAGNOSIS — I959 Hypotension, unspecified: Secondary | ICD-10-CM | POA: Diagnosis not present

## 2023-02-02 DIAGNOSIS — J40 Bronchitis, not specified as acute or chronic: Secondary | ICD-10-CM | POA: Diagnosis not present

## 2023-02-02 DIAGNOSIS — J439 Emphysema, unspecified: Secondary | ICD-10-CM | POA: Diagnosis not present

## 2023-02-02 DIAGNOSIS — J9601 Acute respiratory failure with hypoxia: Secondary | ICD-10-CM | POA: Diagnosis not present

## 2023-02-02 DIAGNOSIS — I4891 Unspecified atrial fibrillation: Secondary | ICD-10-CM | POA: Diagnosis not present

## 2023-02-02 DIAGNOSIS — Z87891 Personal history of nicotine dependence: Secondary | ICD-10-CM | POA: Diagnosis not present

## 2023-02-02 DIAGNOSIS — J811 Chronic pulmonary edema: Secondary | ICD-10-CM | POA: Diagnosis not present

## 2023-02-02 DIAGNOSIS — Z955 Presence of coronary angioplasty implant and graft: Secondary | ICD-10-CM

## 2023-02-02 DIAGNOSIS — R7989 Other specified abnormal findings of blood chemistry: Secondary | ICD-10-CM | POA: Diagnosis present

## 2023-02-02 DIAGNOSIS — J9811 Atelectasis: Secondary | ICD-10-CM | POA: Diagnosis present

## 2023-02-02 DIAGNOSIS — I480 Paroxysmal atrial fibrillation: Principal | ICD-10-CM

## 2023-02-02 DIAGNOSIS — J8410 Pulmonary fibrosis, unspecified: Secondary | ICD-10-CM | POA: Diagnosis not present

## 2023-02-02 DIAGNOSIS — I129 Hypertensive chronic kidney disease with stage 1 through stage 4 chronic kidney disease, or unspecified chronic kidney disease: Secondary | ICD-10-CM | POA: Diagnosis present

## 2023-02-02 DIAGNOSIS — R000 Tachycardia, unspecified: Secondary | ICD-10-CM | POA: Diagnosis present

## 2023-02-02 DIAGNOSIS — E860 Dehydration: Secondary | ICD-10-CM | POA: Diagnosis not present

## 2023-02-02 DIAGNOSIS — Z9861 Coronary angioplasty status: Secondary | ICD-10-CM | POA: Diagnosis not present

## 2023-02-02 DIAGNOSIS — Z8719 Personal history of other diseases of the digestive system: Secondary | ICD-10-CM

## 2023-02-02 DIAGNOSIS — E785 Hyperlipidemia, unspecified: Secondary | ICD-10-CM | POA: Diagnosis not present

## 2023-02-02 DIAGNOSIS — D631 Anemia in chronic kidney disease: Secondary | ICD-10-CM | POA: Diagnosis present

## 2023-02-02 DIAGNOSIS — J449 Chronic obstructive pulmonary disease, unspecified: Secondary | ICD-10-CM | POA: Diagnosis not present

## 2023-02-02 DIAGNOSIS — Z8249 Family history of ischemic heart disease and other diseases of the circulatory system: Secondary | ICD-10-CM

## 2023-02-02 DIAGNOSIS — Z7951 Long term (current) use of inhaled steroids: Secondary | ICD-10-CM

## 2023-02-02 DIAGNOSIS — N189 Chronic kidney disease, unspecified: Secondary | ICD-10-CM

## 2023-02-02 DIAGNOSIS — Z825 Family history of asthma and other chronic lower respiratory diseases: Secondary | ICD-10-CM

## 2023-02-02 DIAGNOSIS — I4892 Unspecified atrial flutter: Secondary | ICD-10-CM | POA: Diagnosis not present

## 2023-02-02 DIAGNOSIS — Z883 Allergy status to other anti-infective agents status: Secondary | ICD-10-CM

## 2023-02-02 DIAGNOSIS — Z882 Allergy status to sulfonamides status: Secondary | ICD-10-CM

## 2023-02-02 DIAGNOSIS — I7300 Raynaud's syndrome without gangrene: Secondary | ICD-10-CM | POA: Diagnosis present

## 2023-02-02 LAB — BASIC METABOLIC PANEL
Anion gap: 11 (ref 5–15)
BUN: 45 mg/dL — ABNORMAL HIGH (ref 8–23)
CO2: 16 mmol/L — ABNORMAL LOW (ref 22–32)
Calcium: 9 mg/dL (ref 8.9–10.3)
Chloride: 108 mmol/L (ref 98–111)
Creatinine, Ser: 1.83 mg/dL — ABNORMAL HIGH (ref 0.44–1.00)
GFR, Estimated: 28 mL/min — ABNORMAL LOW (ref >60)
Glucose, Bld: 190 mg/dL — ABNORMAL HIGH (ref 70–99)
Potassium: 4.2 mmol/L (ref 3.5–5.1)
Sodium: 135 mmol/L (ref 135–145)

## 2023-02-02 LAB — CBC
HCT: 33.9 % — ABNORMAL LOW (ref 36.0–46.0)
Hemoglobin: 10 g/dL — ABNORMAL LOW (ref 12.0–15.0)
MCH: 28.3 pg (ref 26.0–34.0)
MCHC: 29.5 g/dL — ABNORMAL LOW (ref 30.0–36.0)
MCV: 96 fL (ref 80.0–100.0)
Platelets: 179 10*3/uL (ref 150–400)
RBC: 3.53 MIL/uL — ABNORMAL LOW (ref 3.87–5.11)
RDW: 17.8 % — ABNORMAL HIGH (ref 11.5–15.5)
WBC: 9.4 10*3/uL (ref 4.0–10.5)
nRBC: 0 % (ref 0.0–0.2)

## 2023-02-02 LAB — SARS CORONAVIRUS 2 BY RT PCR: SARS Coronavirus 2 by RT PCR: NEGATIVE

## 2023-02-02 LAB — TROPONIN I (HIGH SENSITIVITY)
Troponin I (High Sensitivity): 37 ng/L — ABNORMAL HIGH (ref <18)
Troponin I (High Sensitivity): 32 ng/L — ABNORMAL HIGH (ref <18)

## 2023-02-02 LAB — BRAIN NATRIURETIC PEPTIDE: B Natriuretic Peptide: 442 pg/mL — ABNORMAL HIGH (ref 0.0–100.0)

## 2023-02-02 MED ORDER — DILTIAZEM HCL-DEXTROSE 125-5 MG/125ML-% IV SOLN (PREMIX)
5.0000 mg/h | INTRAVENOUS | Status: DC
Start: 2023-02-02 — End: 2023-02-03
  Administered 2023-02-02: 5 mg/h via INTRAVENOUS
  Filled 2023-02-02: qty 125

## 2023-02-02 MED ORDER — DILTIAZEM LOAD VIA INFUSION
10.0000 mg | Freq: Once | INTRAVENOUS | Status: AC
  Administered 2023-02-02: 10 mg via INTRAVENOUS
  Filled 2023-02-02: qty 10

## 2023-02-02 MED ORDER — HEPARIN (PORCINE) 25000 UT/250ML-% IV SOLN
800.0000 [IU]/h | INTRAVENOUS | Status: DC
  Administered 2023-02-02: 800 [IU]/h via INTRAVENOUS
  Filled 2023-02-02: qty 250

## 2023-02-02 NOTE — H&P (Incomplete)
History and Physical    Patient: Patricia Jackson IHK:[id number] DOB: 11/02/1940 DOA: 02/02/2023 DOS: the patient was seen and examined on 02/03/2023 PCP: Benita Stabile, MD  Patient coming from: Home  Chief Complaint:  Chief Complaint  Patient presents with   Shortness of Breath   HPI: Patricia Jackson is a 82 y.o. female with medical history significant of hypertension, COPD, chronic kidney disease, GI bleed, pulmonary fibrosis due to shortness of breath since she woke up this morning, niece at bedside states that she could barely walk 10 feet from her bedroom to the bathroom without being short of breath, she also complained of minimal chest pressure without radiation.  Patient was recently admitted from 10/17 to 10/24 due to paroxysmal atrial fibrillation with RVR requiring DCCV on 10/18 and was asked to continue with diltiazem 120 mg daily and Eliquis (patient states she has been compliant with these).  She also presented with acute hypoxic respiratory failure that was initially due to pulmonary edema and eventually due to acute exacerbation of COPD (treated with doxycycline and steroids as well as bronchodilators), PAF and atelectasis.  ED Course:  In the emergency department, patient was tachypneic and tachycardic, other vital signs were within normal range.  Workup in the ED showed normocytic anemia, BMP was positive for bicarb of 16, blood glucose 198, BUN/creatinine 45/1.83, GFR 28 (baseline creatinine at 1.2-1.5).  Troponin x 2 - 37 > 32.  BNP 442 (this was 905 on 10/17), urinalysis was normal.  SARS coronavirus 2 was negative. Chest x-ray showed no active disease Patient was initially started on IV Cardizem drip, but this was discontinued due to hypotension and she was transitioned to amiodarone.  She was started on IV heparin drip. Cardiologist at [facility] (Dr. Derrell Lolling) was consulted and recommended that patient can be admitted to [facility] and cardiology will see her in the morning. Hospitalist  was asked to admit patient for further evaluation and management.  Review of Systems: Review of systems as noted in the HPI. All other systems reviewed and are negative.   Past Medical History:  Diagnosis Date   A-fib [facility])    Atrial flutter (HCC)    On Eliquis in 8/17 but discontinued after stent placement   Breast cancer (HCC)    remote   CAD in native artery 03/16/2016   COPD (chronic obstructive pulmonary disease) (HCC)    Dysrhythmia    Hypertension    Iron deficiency anemia 06/12/2021   NSTEMI (non-ST elevated myocardial infarction) (HCC)    2017   S/P angioplasty with stent 03/15/16 DES Resolute, pLCX 03/16/2016   Vitreous hemorrhage of left eye (HCC) 08/01/2019   Past Surgical History:  Procedure Laterality Date   APPENDECTOMY     BREAST SURGERY Left    CARDIAC CATHETERIZATION N/A 03/15/2016   Procedure: Left Heart Cath and Coronary Angiography;  Surgeon: Lyn Records, MD;  Location: [facility] INVASIVE CV LAB;  Service: Cardiovascular;  Laterality: N/A;   CARDIAC CATHETERIZATION N/A 03/15/2016   Procedure: Coronary Stent Intervention;  Surgeon: Lyn Records, MD;  Location: [facility] INVASIVE CV LAB;  Service: Cardiovascular;  Laterality: N/A;   COLONOSCOPY     remote   COLONOSCOPY N/A 05/26/2016   Procedure: COLONOSCOPY;  Surgeon: Corbin Ade, MD;  Location: [facility] ENDO SUITE;  Service: Endoscopy;  Laterality: N/A;  845   ESOPHAGOGASTRODUODENOSCOPY N/A 05/26/2016   Procedure: ESOPHAGOGASTRODUODENOSCOPY (EGD);  Surgeon: Corbin Ade, MD;  Location: [facility] ENDO SUITE;  Service: Endoscopy;  Laterality: N/A;  ESOPHAGOGASTRODUODENOSCOPY (EGD) WITH PROPOFOL N/A 11/22/2022   Procedure: ESOPHAGOGASTRODUODENOSCOPY (EGD) WITH PROPOFOL;  Surgeon: Lanelle Bal, DO;  Location: [facility] ENDO SUITE;  Service: Endoscopy;  Laterality: N/A;   EYE SURGERY     EYE SURGERY Left    Dr. Barbaraann Barthel   HOT HEMOSTASIS  11/22/2022   Procedure: HOT HEMOSTASIS (ARGON PLASMA COAGULATION/BICAP);  Surgeon: Lanelle Bal, DO;  Location: [facility] ENDO SUITE;  Service: Endoscopy;;   MALONEY DILATION N/A 05/26/2016   Procedure: Elease Hashimoto DILATION;  Surgeon: Corbin Ade, MD;  Location: [facility] ENDO SUITE;  Service: Endoscopy;  Laterality: N/A;   TOOTH EXTRACTION N/A 01/15/2022   Procedure: DENTAL RESTORATION/EXTRACTIONS;  Surgeon: Ocie Doyne, DMD;  Location: [facility] OR;  Service: Oral Surgery;  Laterality: N/A;   VITRECTOMY Left 10/03/2019   Dr. Luciana Axe, Vitrectomy, Focal Laser, Removal of Silicone Oil    Social History:  reports that she quit smoking about 7 years ago. Her smoking use included cigarettes. She has been exposed to tobacco smoke. She has never used smokeless tobacco. She reports that she does not drink alcohol and does not use drugs.   Allergies  Allergen Reactions   Povidone-Iodine Other (See Comments)    Burning   Sulfa Antibiotics Other (See Comments)    Unknown reaction    Sulfamethoxazole Other (See Comments)    unknown    Family History  Problem Relation Age of Onset   Heart disease Mother    COPD Father    Cancer Sister        unknown primary   Cancer Brother        unknown primary   Cancer Brother    Hypertension Son    Colon cancer Neg Hx      Prior to Admission medications   Medication Sig Start Date End Date Taking? Authorizing Provider  acetaminophen (TYLENOL) 500 MG tablet Take 1,000 mg by mouth every 8 (eight) hours as needed for mild pain or moderate pain. 03/21/17   [provider]  albuterol (PROVENTIL) (2.5 MG/3ML) 0.083% nebulizer solution Take 2.5 mg by nebulization every 6 (six) hours as needed for wheezing or shortness of breath.    [provider]  apixaban (ELIQUIS) 5 MG TABS tablet Take 1 tablet (5 mg total) by mouth 2 (two) times daily. 11/25/22   Lewie Chamber, MD  atropine 1 % ophthalmic solution Place 1 drop into both eyes 3 (three) times daily.    [provider]  benzonatate (TESSALON) 100 MG capsule Take 1 capsule (100 mg  total) by mouth every 8 (eight) hours. 01/18/23   Bethann Berkshire, MD  buPROPion (WELLBUTRIN XL) 150 MG 24 hr tablet Take 150 mg by mouth daily. 11/18/22   [provider]  Cholecalciferol (VITAMIN D3) 50 MCG (2000 UT) TABS Take 2,000 Units by mouth daily.    [provider]  Copper Gluconate (COPPER CAPS PO) Take 6 mg by mouth daily.    [provider]  denosumab (PROLIA) 60 MG/ML SOSY injection Inject 60 mg into the skin every 6 (six) months. Patient not taking: Reported on 01/20/2023    [provider]  diltiazem (CARDIZEM CD) 120 MG 24 hr capsule Take 1 capsule (120 mg total) by mouth daily. 01/28/23   Tyrone Nine, MD  ferrous sulfate 325 (65 FE) MG EC tablet Take 1 tablet (325 mg total) by mouth daily with breakfast. 11/22/22 11/22/23  Lewie Chamber, MD  levothyroxine (SYNTHROID) 50 MCG tablet Take 50 mcg by mouth daily before  breakfast.  09/20/19   [provider]  nitroGLYCERIN (NITROSTAT) 0.4 MG SL tablet Place 0.4 mg under the tongue every 5 (five) minutes as needed for chest pain. 06/09/19   [provider]  pantoprazole (PROTONIX) 40 MG tablet Take 1 tablet (40 mg total) by mouth 2 (two) times daily. 11/22/22   Lewie Chamber, MD  prednisoLONE acetate (PRED FORTE) 1 % ophthalmic suspension Place 1 drop into the right eye in the morning and at bedtime. 12/20/22 03/20/23  [provider]  rosuvastatin (CRESTOR) 20 MG tablet Take 20 mg by mouth daily.  02/26/17   [provider]  sertraline (ZOLOFT) 25 MG tablet Take 25 mg by mouth daily.    [provider]  [name] 200-62.5-25 MCG/ACT AEPB Inhale 1 puff into the lungs daily. 11/02/22   [provider]    Physical Exam: BP 115/64   Pulse 92   Temp 98.4 F (36.9 C) (Oral)   Resp 20   Ht 5\' 7"  (1.702 m)   Wt 60.3 kg   SpO2 98%   BMI 20.82 kg/m   General: 82 y.o. year-old female well developed well nourished in no acute distress.  Alert and  oriented x3. HEENT: NCAT, EOMI Neck: Supple, trachea medial Cardiovascular: Tachycardia.  Irregular rhythm with no rubs or gallops.  No thyromegaly or JVD noted.  No lower extremity edema. 2/4 pulses in all 4 extremities. Respiratory: Clear to auscultation with no wheezes or rales. Good inspiratory effort. Abdomen: Soft, nontender nondistended with normal bowel sounds x4 quadrants. Muskuloskeletal: No cyanosis, clubbing or edema noted bilaterally Neuro: CN II-XII intact, strength 5/5 x 4, sensation, reflexes intact Skin: No ulcerative lesions noted or rashes Psychiatry: Judgement and insight appear normal. Mood is appropriate for condition and setting          Labs on Admission:  Basic Metabolic Panel: Recent Labs  Lab 01/27/23 0802 02/02/23 1910  NA 134* 135  K 5.3* 4.2  CL 102 108  CO2 20* 16*  GLUCOSE 96 190*  BUN 57* 45*  CREATININE 1.56* 1.83*  CALCIUM 9.2 9.0   Liver Function Tests: No results for input(s): "AST", "ALT", "ALKPHOS", "BILITOT", "PROT", "ALBUMIN" in the last 168 hours. No results for input(s): "LIPASE", "AMYLASE" in the last 168 hours. No results for input(s): "AMMONIA" in the last 168 hours. CBC: Recent Labs  Lab 02/02/23 1955  WBC 9.4  HGB 10.0*  HCT 33.9*  MCV 96.0  PLT 179   Cardiac Enzymes: No results for input(s): "CKTOTAL", "CKMB", "CKMBINDEX", "TROPONINI" in the last 168 hours.  BNP (last 3 results) Recent Labs    09/12/22 1727 01/20/23 1449 02/02/23 1955  BNP 190.0* 905.0* 442.0*    ProBNP (last 3 results) No results for input(s): "PROBNP" in the last 8760 hours.  CBG: No results for input(s): "GLUCAP" in the last 168 hours.  Radiological Exams on Admission: DG Chest Portable 1 View  Result Date: 02/02/2023 CLINICAL DATA:  Shortness of breath EXAM: PORTABLE CHEST 1 VIEW COMPARISON:  01/20/2023, chest CT 09/12/2022 FINDINGS: Emphysema and bronchitic changes. Minimal atelectasis or scarring at the bases. No consolidation,  pleural effusion or pneumothorax. Normal cardiac size with aortic atherosclerosis. IMPRESSION: No active disease. Emphysema and bronchitic changes. Electronically Signed   By: Jasmine Pang M.D.   On: 02/02/2023 20:26    EKG: I independently viewed the EKG done and my findings are as followed: Atrial fibrillation with RVR  Assessment/Plan Present on Admission:  Paroxysmal atrial fibrillation with RVR (HCC)  Elevated troponin  Acute kidney injury superimposed on stage 4 chronic kidney disease (HCC)  CAD in native artery  Chronic obstructive pulmonary disease (HCC)  Essential hypertension  Acquired hypothyroidism  Principal Problem:   Paroxysmal atrial fibrillation with RVR (HCC) Active Problems:   Elevated troponin   Essential hypertension   CAD in native artery   Acute kidney injury superimposed on stage 4 chronic kidney disease (HCC)   Chronic obstructive pulmonary disease (HCC)   Acquired hypothyroidism   Elevated brain natriuretic peptide (BNP) level  Paroxysmal atrial fibrillation with RVR Patient was initially started on IV Cardizem drip, but this was transitioned to IV amiodarone due to hypotension Continue heparin drip  Elevated troponin rule out ACS Patient complained of shortness of breath on exertion Troponin 37  (this was 33 > 26 on 01/20/2023); IV heparin was started in the ED per cardiologist's recommendation per EDP Troponin eventually dropped to 32 and may just be due to type II demand ischemia Patient will be seen in the morning by cardiology and we shall await further recommendations  Elevated BNP BNP 442 (this was 905 on 10/17) Echocardiogram done on 09/13/2022 showed LVEF of 60 to 65%.  No RWMA.  Mild LVH. Continue heart healthy/modified carb diet   Acute kidney injury superimposed on CKD stage IV BUN/creatinine 45/1.83 (baseline creatinine at 1.2-1.5). Renally adjust medications, avoid nephrotoxic agents/dehydration/hypotension  Hyperglycemia possibly  reactive Patient has no history of diabetes mellitus Hemoglobin A1c will be checked Continue to monitor blood glucose levels  CAD s/p DES to LCx 2017  Continue Crestor  COPD Continue albuterol, Breo and Incruse Ellipta  Essential hypertension BP meds will be held at this time due to soft BP  Acquired hypothyroidism Continue Synthroid   DVT prophylaxis: Heparin drip   Advance Care Planning: CODE STATUS: Full code  Consults: Cardiology  Family Communication: Niece at bedside (all questions answered to satisfaction)  Severity of Illness: The appropriate patient status for this patient is OBSERVATION. Observation status is judged to be reasonable and necessary in order to provide the required intensity of service to ensure the patient's safety. The patient's presenting symptoms, physical exam findings, and initial radiographic and laboratory data in the context of their medical condition is felt to place them at decreased risk for further clinical deterioration. Furthermore, it is anticipated that the patient will be medically stable for discharge from the hospital within 2 midnights of admission.   Author: Frankey Shown, DO 02/03/2023 5:04 AM  For on call review [email or web address].

## 2023-02-02 NOTE — ED Provider Notes (Signed)
[place] EMERGENCY DEPARTMENT AT [facility] Provider Note   CSN: [id number] Arrival date & time: 02/02/23  1813     History {Add pertinent medical, surgical, social history, OB history to HPI:1} Chief Complaint  Patient presents with   Shortness of Breath    Patricia Jackson is a 82 y.o. female.  She has a history of pulmonary fibrosis COPD A-fib GI bleed.  She was admitted about 12 days ago for increased shortness of breath atrial fibrillation with rapid ventricular response.  She continues to take her anticoagulation and finished a course of doxycycline and steroids.  She remains on diltiazem.  She said she has been short of breath since waking up this morning.  Feeling like she might pass out.  She feels a little bit of chest pressure but does not notice her heart rate being elevated.  The history is provided by the patient.  Shortness of Breath Severity:  Moderate Onset quality:  Sudden Duration:  1 day Timing:  Constant Progression:  Unchanged Chronicity:  Recurrent Relieved by:  Nothing Worsened by:  Activity Ineffective treatments:  Rest Associated symptoms: chest pain   Associated symptoms: no abdominal pain, no cough, no fever, no sputum production and no syncope        Home Medications Prior to Admission medications   Medication Sig Start Date End Date Taking? Authorizing Provider  acetaminophen (TYLENOL) 500 MG tablet Take 1,000 mg by mouth every 8 (eight) hours as needed for mild pain or moderate pain. 03/21/17   [provider]  albuterol (PROVENTIL) (2.5 MG/3ML) 0.083% nebulizer solution Take 2.5 mg by nebulization every 6 (six) hours as needed for wheezing or shortness of breath.    [provider]  apixaban (ELIQUIS) 5 MG TABS tablet Take 1 tablet (5 mg total) by mouth 2 (two) times daily. 11/25/22   Lewie Chamber, MD  atropine 1 % ophthalmic solution Place 1 drop into both eyes 3 (three) times daily.    [provider]   benzonatate (TESSALON) 100 MG capsule Take 1 capsule (100 mg total) by mouth every 8 (eight) hours. 01/18/23   Bethann Berkshire, MD  buPROPion (WELLBUTRIN XL) 150 MG 24 hr tablet Take 150 mg by mouth daily. 11/18/22   [provider]  Cholecalciferol (VITAMIN D3) 50 MCG (2000 UT) TABS Take 2,000 Units by mouth daily.    [provider]  Copper Gluconate (COPPER CAPS PO) Take 6 mg by mouth daily.    [provider]  denosumab (PROLIA) 60 MG/ML SOSY injection Inject 60 mg into the skin every 6 (six) months. Patient not taking: Reported on 01/20/2023    [provider]  diltiazem (CARDIZEM CD) 120 MG 24 hr capsule Take 1 capsule (120 mg total) by mouth daily. 01/28/23   Tyrone Nine, MD  ferrous sulfate 325 (65 FE) MG EC tablet Take 1 tablet (325 mg total) by mouth daily with breakfast. 11/22/22 11/22/23  Lewie Chamber, MD  levothyroxine (SYNTHROID) 50 MCG tablet Take 50 mcg by mouth daily before breakfast.  09/20/19   [provider]  nitroGLYCERIN (NITROSTAT) 0.4 MG SL tablet Place 0.4 mg under the tongue every 5 (five) minutes as needed for chest pain. 06/09/19   [provider]  pantoprazole (PROTONIX) 40 MG tablet Take 1 tablet (40 mg total) by mouth 2 (two) times daily. 11/22/22   Lewie Chamber, MD  prednisoLONE acetate (PRED FORTE) 1 % ophthalmic suspension Place 1 drop into the right eye in the morning  and at bedtime. 12/20/22 03/20/23  [provider]  rosuvastatin (CRESTOR) 20 MG tablet Take 20 mg by mouth daily.  02/26/17   [provider]  sertraline (ZOLOFT) 25 MG tablet Take 25 mg by mouth daily.    [provider]  [name] 200-62.5-25 MCG/ACT AEPB Inhale 1 puff into the lungs daily. 11/02/22   [provider]      Allergies    Povidone-iodine, Sulfa antibiotics, and Sulfamethoxazole    Review of Systems   Review of Systems  Constitutional:  Negative for fever.  Respiratory:  Positive for  shortness of breath. Negative for cough and sputum production.   Cardiovascular:  Positive for chest pain. Negative for syncope.  Gastrointestinal:  Negative for abdominal pain.    Physical Exam Updated Vital Signs BP (!) 114/102 (BP Location: Right Arm)   Pulse 62   Temp 98.7 F (37.1 C) (Oral)   Resp (!) 24   Ht 5\' 7"  (1.702 m)   Wt 60.3 kg   SpO2 100%   BMI 20.82 kg/m  Physical Exam Vitals and nursing note reviewed.  Constitutional:      General: She is not in acute distress.    Appearance: She is well-developed.  HENT:     Head: Normocephalic and atraumatic.  Eyes:     Conjunctiva/sclera: Conjunctivae normal.  Cardiovascular:     Rate and Rhythm: Tachycardia present. Rhythm irregular.     Heart sounds: No murmur heard. Pulmonary:     Effort: Pulmonary effort is normal. No respiratory distress.     Breath sounds: Normal breath sounds.  Abdominal:     Palpations: Abdomen is soft.     Tenderness: There is no abdominal tenderness.  Musculoskeletal:        General: No swelling.     Cervical back: Neck supple.     Right lower leg: No tenderness. No edema.     Left lower leg: No tenderness. No edema.  Skin:    General: Skin is warm and dry.     Capillary Refill: Capillary refill takes less than 2 seconds.  Neurological:     General: No focal deficit present.     Mental Status: She is alert.     ED Results / Procedures / Treatments   Labs (all labs ordered are listed, but only abnormal results are displayed) Labs Reviewed  SARS CORONAVIRUS 2 BY RT PCR  BASIC METABOLIC PANEL  CBC  URINALYSIS, ROUTINE W REFLEX MICROSCOPIC  TROPONIN I (HIGH SENSITIVITY)    EKG EKG Interpretation Date/Time:  Wednesday February 02 2023 18:44:25 EDT Ventricular Rate:  136 PR Interval:    QRS Duration:  88 QT Interval:  321 QTC Calculation: 483 R Axis:   75  Text Interpretation: Atrial fibrillation Minimal ST depression, inferior leads Confirmed by Meridee Score [phone]) on  02/02/2023 6:55:12 PM  Radiology No results found.  Procedures Procedures  {Document cardiac monitor, telemetry assessment procedure when appropriate:1}  Medications Ordered in ED Medications - No data to display  ED Course/ Medical Decision Making/ A&P Clinical Course as of 02/02/23 2142  Wed Feb 02, 2023  2113 Discussed with [facility] cardiology Dr. Derrell Lolling.  He felt that she would be okay to stay at [facility] and have cardiology see her tomorrow to see if they want to go ahead and cardiovert versus work on some other medication for rhythm strategy. [MB]  2142 Discussed with [facility] hospitalist Dr. Thomes Dinning who will evaluate patient for admission. [MB]  Clinical Course User Index [MB] Terrilee Files, MD   {   Click here for ABCD2, HEART and other calculatorsREFRESH Note before signing :1}                              Medical Decision Making Amount and/or Complexity of Data Reviewed Labs: ordered. Radiology: ordered.  Risk Prescription drug management.   This patient complains of ***; this involves an extensive number of treatment Options and is a complaint that carries with it a high risk of complications and morbidity. The differential includes ***  I ordered, reviewed and interpreted labs, which included *** I ordered medication *** and reviewed PMP when indicated. I ordered imaging studies which included *** and I independently    visualized and interpreted imaging which showed *** Additional history obtained from *** Previous records obtained and reviewed *** I consulted *** and discussed lab and imaging findings and discussed disposition.  Cardiac monitoring reviewed, *** Social determinants considered, *** Critical Interventions: ***  After the interventions stated above, I reevaluated the patient and found *** Admission and further testing considered, ***   {Document critical care time when appropriate:1} {Document review of labs and clinical decision tools  ie heart score, Chads2Vasc2 etc:1}  {Document your independent review of radiology images, and any outside records:1} {Document your discussion with family members, caretakers, and with consultants:1} {Document social determinants of health affecting pt's care:1} {Document your decision making why or why not admission, treatments were needed:1} Final Clinical Impression(s) / ED Diagnoses Final diagnoses:  None    Rx / DC Orders ED Discharge Orders     None

## 2023-02-02 NOTE — ED Triage Notes (Signed)
Pt presents with increased SOB for over week, recently admitted for afib.

## 2023-02-02 NOTE — ED Notes (Signed)
Pt given sandwich and drink per request, pt was informed she would be NPO after midnight.

## 2023-02-03 DIAGNOSIS — N179 Acute kidney failure, unspecified: Secondary | ICD-10-CM | POA: Diagnosis not present

## 2023-02-03 DIAGNOSIS — I4892 Unspecified atrial flutter: Secondary | ICD-10-CM | POA: Diagnosis not present

## 2023-02-03 DIAGNOSIS — I480 Paroxysmal atrial fibrillation: Secondary | ICD-10-CM | POA: Diagnosis not present

## 2023-02-03 DIAGNOSIS — I2489 Other forms of acute ischemic heart disease: Secondary | ICD-10-CM | POA: Diagnosis not present

## 2023-02-03 DIAGNOSIS — Z955 Presence of coronary angioplasty implant and graft: Secondary | ICD-10-CM | POA: Diagnosis not present

## 2023-02-03 DIAGNOSIS — D631 Anemia in chronic kidney disease: Secondary | ICD-10-CM | POA: Diagnosis not present

## 2023-02-03 DIAGNOSIS — N184 Chronic kidney disease, stage 4 (severe): Secondary | ICD-10-CM | POA: Diagnosis not present

## 2023-02-03 DIAGNOSIS — Z79899 Other long term (current) drug therapy: Secondary | ICD-10-CM | POA: Diagnosis not present

## 2023-02-03 DIAGNOSIS — J8410 Pulmonary fibrosis, unspecified: Secondary | ICD-10-CM | POA: Diagnosis present

## 2023-02-03 DIAGNOSIS — J9811 Atelectasis: Secondary | ICD-10-CM | POA: Diagnosis not present

## 2023-02-03 DIAGNOSIS — E860 Dehydration: Secondary | ICD-10-CM | POA: Diagnosis present

## 2023-02-03 DIAGNOSIS — I959 Hypotension, unspecified: Secondary | ICD-10-CM | POA: Diagnosis not present

## 2023-02-03 DIAGNOSIS — Z7901 Long term (current) use of anticoagulants: Secondary | ICD-10-CM | POA: Diagnosis not present

## 2023-02-03 DIAGNOSIS — J9601 Acute respiratory failure with hypoxia: Secondary | ICD-10-CM | POA: Diagnosis not present

## 2023-02-03 DIAGNOSIS — R7989 Other specified abnormal findings of blood chemistry: Secondary | ICD-10-CM | POA: Insufficient documentation

## 2023-02-03 DIAGNOSIS — Z1152 Encounter for screening for COVID-19: Secondary | ICD-10-CM | POA: Diagnosis not present

## 2023-02-03 DIAGNOSIS — I340 Nonrheumatic mitral (valve) insufficiency: Secondary | ICD-10-CM | POA: Diagnosis not present

## 2023-02-03 DIAGNOSIS — I4891 Unspecified atrial fibrillation: Secondary | ICD-10-CM | POA: Diagnosis not present

## 2023-02-03 DIAGNOSIS — I129 Hypertensive chronic kidney disease with stage 1 through stage 4 chronic kidney disease, or unspecified chronic kidney disease: Secondary | ICD-10-CM | POA: Diagnosis not present

## 2023-02-03 DIAGNOSIS — R0602 Shortness of breath: Secondary | ICD-10-CM | POA: Diagnosis present

## 2023-02-03 DIAGNOSIS — I2510 Atherosclerotic heart disease of native coronary artery without angina pectoris: Secondary | ICD-10-CM | POA: Diagnosis not present

## 2023-02-03 DIAGNOSIS — I252 Old myocardial infarction: Secondary | ICD-10-CM | POA: Diagnosis not present

## 2023-02-03 DIAGNOSIS — J441 Chronic obstructive pulmonary disease with (acute) exacerbation: Secondary | ICD-10-CM | POA: Diagnosis not present

## 2023-02-03 DIAGNOSIS — Z87891 Personal history of nicotine dependence: Secondary | ICD-10-CM | POA: Diagnosis not present

## 2023-02-03 DIAGNOSIS — E039 Hypothyroidism, unspecified: Secondary | ICD-10-CM | POA: Diagnosis not present

## 2023-02-03 DIAGNOSIS — J811 Chronic pulmonary edema: Secondary | ICD-10-CM | POA: Diagnosis not present

## 2023-02-03 DIAGNOSIS — E785 Hyperlipidemia, unspecified: Secondary | ICD-10-CM | POA: Diagnosis present

## 2023-02-03 DIAGNOSIS — Z8249 Family history of ischemic heart disease and other diseases of the circulatory system: Secondary | ICD-10-CM | POA: Diagnosis not present

## 2023-02-03 LAB — MRSA NEXT GEN BY PCR, NASAL: MRSA by PCR Next Gen: NOT DETECTED

## 2023-02-03 LAB — HEPARIN LEVEL (UNFRACTIONATED): Heparin Unfractionated: >1.1 [IU]/mL — ABNORMAL HIGH (ref 0.30–0.70)

## 2023-02-03 LAB — COMPREHENSIVE METABOLIC PANEL
ALT: 27 U/L (ref 0–44)
AST: 33 U/L (ref 15–41)
Albumin: 2.9 g/dL — ABNORMAL LOW (ref 3.5–5.0)
Alkaline Phosphatase: 63 U/L (ref 38–126)
Anion gap: 10 (ref 5–15)
BUN: 41 mg/dL — ABNORMAL HIGH (ref 8–23)
CO2: 17 mmol/L — ABNORMAL LOW (ref 22–32)
Calcium: 9 mg/dL (ref 8.9–10.3)
Chloride: 109 mmol/L (ref 98–111)
Creatinine, Ser: 1.69 mg/dL — ABNORMAL HIGH (ref 0.44–1.00)
GFR, Estimated: 31 mL/min — ABNORMAL LOW (ref >60)
Glucose, Bld: 104 mg/dL — ABNORMAL HIGH (ref 70–99)
Potassium: 3.7 mmol/L (ref 3.5–5.1)
Sodium: 136 mmol/L (ref 135–145)
Total Bilirubin: 0.4 mg/dL (ref 0.3–1.2)
Total Protein: 5.6 g/dL — ABNORMAL LOW (ref 6.5–8.1)

## 2023-02-03 LAB — HEMOGLOBIN A1C
Hgb A1c MFr Bld: 6 % — ABNORMAL HIGH (ref 4.8–5.6)
Mean Plasma Glucose: 125.5 mg/dL

## 2023-02-03 LAB — URINALYSIS, ROUTINE W REFLEX MICROSCOPIC
Bacteria, UA: NONE SEEN
Bilirubin Urine: NEGATIVE
Glucose, UA: NEGATIVE mg/dL
Hgb urine dipstick: NEGATIVE
Ketones, ur: NEGATIVE mg/dL
Nitrite: NEGATIVE
Protein, ur: NEGATIVE mg/dL
Specific Gravity, Urine: 1.016 (ref 1.005–1.030)
pH: 5 (ref 5.0–8.0)

## 2023-02-03 LAB — APTT
aPTT: >200 s (ref 24–36)
aPTT: 179 s (ref 24–36)

## 2023-02-03 LAB — CBC
HCT: 33.1 % — ABNORMAL LOW (ref 36.0–46.0)
Hemoglobin: 9.8 g/dL — ABNORMAL LOW (ref 12.0–15.0)
MCH: 28.6 pg (ref 26.0–34.0)
MCHC: 29.6 g/dL — ABNORMAL LOW (ref 30.0–36.0)
MCV: 96.5 fL (ref 80.0–100.0)
Platelets: 146 10*3/uL — ABNORMAL LOW (ref 150–400)
RBC: 3.43 MIL/uL — ABNORMAL LOW (ref 3.87–5.11)
RDW: 17.8 % — ABNORMAL HIGH (ref 11.5–15.5)
WBC: 6.9 10*3/uL (ref 4.0–10.5)
nRBC: 0 % (ref 0.0–0.2)

## 2023-02-03 LAB — PHOSPHORUS: Phosphorus: 3.7 mg/dL (ref 2.5–4.6)

## 2023-02-03 LAB — TSH: TSH: 2.628 u[IU]/mL (ref 0.350–4.500)

## 2023-02-03 LAB — MAGNESIUM: Magnesium: 2.3 mg/dL (ref 1.7–2.4)

## 2023-02-03 MED ORDER — AMIODARONE HCL IN DEXTROSE 360-4.14 MG/200ML-% IV SOLN
60.0000 mg/h | INTRAVENOUS | Status: AC
  Administered 2023-02-03: 60 mg/h via INTRAVENOUS
  Filled 2023-02-03 (×2): qty 200

## 2023-02-03 MED ORDER — UMECLIDINIUM BROMIDE 62.5 MCG/ACT IN AEPB
1.0000 | INHALATION_SPRAY | Freq: Every day | RESPIRATORY_TRACT | Status: DC
  Administered 2023-02-03 – 2023-02-05 (×3): 1 via RESPIRATORY_TRACT
  Filled 2023-02-03: qty 7

## 2023-02-03 MED ORDER — ACETAMINOPHEN 650 MG RE SUPP: 650.0000 mg | Freq: Four times a day (QID) | RECTAL | Status: DC | PRN

## 2023-02-03 MED ORDER — CHLORHEXIDINE GLUCONATE CLOTH 2 % EX PADS
6.0000 | MEDICATED_PAD | Freq: Every day | CUTANEOUS | Status: DC
  Administered 2023-02-03 – 2023-02-05 (×3): 6 via TOPICAL

## 2023-02-03 MED ORDER — ONDANSETRON HCL 4 MG PO TABS: 4.0000 mg | ORAL_TABLET | Freq: Four times a day (QID) | ORAL | Status: DC | PRN

## 2023-02-03 MED ORDER — FLUTICASONE FUROATE-VILANTEROL 200-25 MCG/ACT IN AEPB
1.0000 | INHALATION_SPRAY | Freq: Every day | RESPIRATORY_TRACT | Status: DC
  Administered 2023-02-03 – 2023-02-05 (×3): 1 via RESPIRATORY_TRACT
  Filled 2023-02-03: qty 28

## 2023-02-03 MED ORDER — LEVOTHYROXINE SODIUM 25 MCG PO TABS
50.0000 ug | ORAL_TABLET | Freq: Every day | ORAL | Status: DC
  Administered 2023-02-03 – 2023-02-05 (×2): 50 ug via ORAL
  Filled 2023-02-03: qty 1
  Filled 2023-02-03: qty 2

## 2023-02-03 MED ORDER — HEPARIN (PORCINE) 25000 UT/250ML-% IV SOLN
600.0000 [IU]/h | INTRAVENOUS | Status: DC
  Administered 2023-02-03: 600 [IU]/h via INTRAVENOUS

## 2023-02-03 MED ORDER — AMIODARONE LOAD VIA INFUSION
150.0000 mg | Freq: Once | INTRAVENOUS | Status: AC
  Administered 2023-02-03: 150 mg via INTRAVENOUS
  Filled 2023-02-03: qty 83.34

## 2023-02-03 MED ORDER — LEVOTHYROXINE SODIUM 50 MCG PO TABS: 50.0000 ug | ORAL_TABLET | Freq: Every day | ORAL | Status: DC

## 2023-02-03 MED ORDER — SODIUM CHLORIDE 0.9 % IV SOLN: INTRAVENOUS | Status: DC

## 2023-02-03 MED ORDER — AMIODARONE HCL IN DEXTROSE 360-4.14 MG/200ML-% IV SOLN
30.0000 mg/h | INTRAVENOUS | Status: DC
  Administered 2023-02-03 – 2023-02-04 (×3): 30 mg/h via INTRAVENOUS
  Filled 2023-02-03 (×2): qty 200

## 2023-02-03 MED ORDER — ONDANSETRON HCL 4 MG/2ML IJ SOLN: 4.0000 mg | Freq: Four times a day (QID) | INTRAMUSCULAR | Status: DC | PRN

## 2023-02-03 MED ORDER — HEPARIN (PORCINE) 25000 UT/250ML-% IV SOLN
500.0000 [IU]/h | INTRAVENOUS | Status: DC
  Administered 2023-02-04: 500 [IU]/h via INTRAVENOUS

## 2023-02-03 MED ORDER — ROSUVASTATIN CALCIUM 20 MG PO TABS
20.0000 mg | ORAL_TABLET | Freq: Every day | ORAL | Status: DC
  Administered 2023-02-03 – 2023-02-05 (×3): 20 mg via ORAL
  Filled 2023-02-03 (×3): qty 1

## 2023-02-03 MED ORDER — ALBUTEROL SULFATE (2.5 MG/3ML) 0.083% IN NEBU: 2.5000 mg | INHALATION_SOLUTION | Freq: Four times a day (QID) | RESPIRATORY_TRACT | Status: DC | PRN

## 2023-02-03 MED ORDER — ACETAMINOPHEN 325 MG PO TABS: 650.0000 mg | ORAL_TABLET | Freq: Four times a day (QID) | ORAL | Status: DC | PRN

## 2023-02-03 MED ORDER — PANTOPRAZOLE SODIUM 40 MG PO TBEC
40.0000 mg | DELAYED_RELEASE_TABLET | Freq: Two times a day (BID) | ORAL | Status: DC
  Administered 2023-02-03 – 2023-02-05 (×5): 40 mg via ORAL
  Filled 2023-02-03 (×5): qty 1

## 2023-02-03 NOTE — Consult Note (Signed)
Cardiology Consultation   Patient ID: Patricia Jackson MRN: [id number]; DOB: 22-May-1940  Admit date: 02/02/2023 Date of Consult: 02/03/2023  PCP:  Benita Stabile, MD   [facility] Providers Cardiologist:  Dina Rich, MD   {   Patient Profile:   Patricia Jackson is a 82 y.o. female with a hx of CAD status post DES to left circumflex in 2017, paroxysmal atrial flutter, history of GI bleed status post transfusions, COPD, breast cancer, Raynaud's syndrome, hypertension, carotid artery stenosis, hyperlipidemia who is being seen 02/03/2023 for the evaluation of paroxysmal A-fib at the request of Dr. Sherryll Burger.  History of Present Illness:   [name] has a history of non-STEMI in 2017 and was treated with DES placement to the left circumflex.  EF was normal at that time.  Patient was admitted in August 2017 with pneumonia and found to be in a flutter started on Eliquis.  During 2018 the patient was admitted with anemia requiring 2 units PRBCs.  Aspirin, Brilinta, and Eliquis for discontinued.  GI saw her with no plans for endoscopy.  Patient was restarted on Plavix.  Patient was seen in the office 05/2017 and patient was restarted on Eliquis 5 mg twice daily.  Most recent echo 09/2022 showed LVEF 60 to 65%, mild LVH, normal RV SF, mild MR.  Patient was patiently admitted 10/17-10/24 with atrial fibrillation with RVR, hypertension, COPD, acute hypoxic respiratory failure.  Patient presented with dyspnea and cough for the last 4 days.  She was noted to be in A-fib RVR and was started on IV Cardizem and underwent cardioversion in the ED on 10/18 with conversion to normal sinus rhythm.  She was treated for COPD exacerbation. She briefly required midodrine, but this was weaned off.  She was discharged on Eliquis and Cardizem 120 mg daily.  Patient presented to the ER 02/02/2023 with shortness of breath. She reports she went home and felt breathing never went back to normal. She reports she  finished her course of doxycycline and steroids.  Shortness of breath started earlier the morning of presentation.  Breathing was worse when she exerted herself. She denies chest pain, orthopnea or pnd. No recent fever, chills, nausea or vomiting.  In the ER blood pressure 114/102.  He was noted to be in A-fib RVR with rates up to 140s.  Labs showed bicarb of 16, blood glucose 198, potassium 4.2, hemoglobin 10, serum creatinine 1.83, BUN 45, high-sensitivity troponin 37, 32.  Mag 2.3, BNP 442.  COVID-negative, chest x-ray unremarkable.  Patient was started on IV Cardizem drip, but this was discontinued due to hypotension and she was transitioned to amiodarone.  Patient was started on IV heparin.   Past Medical History:  Diagnosis Date   A-fib [facility])    Atrial flutter (HCC)    On Eliquis in 8/17 but discontinued after stent placement   Breast cancer (HCC)    remote   CAD in native artery 03/16/2016   COPD (chronic obstructive pulmonary disease) (HCC)    Dysrhythmia    Hypertension    Iron deficiency anemia 06/12/2021   NSTEMI (non-ST elevated myocardial infarction) (HCC)    2017   S/P angioplasty with stent 03/15/16 DES Resolute, pLCX 03/16/2016   Vitreous hemorrhage of left eye (HCC) 08/01/2019    Past Surgical History:  Procedure Laterality Date   APPENDECTOMY     BREAST SURGERY Left    CARDIAC CATHETERIZATION N/A 03/15/2016   Procedure: Left Heart Cath and Coronary Angiography;  Surgeon: Sherilyn Cooter  Malissa Hippo, MD;  Location: [facility] INVASIVE CV LAB;  Service: Cardiovascular;  Laterality: N/A;   CARDIAC CATHETERIZATION N/A 03/15/2016   Procedure: Coronary Stent Intervention;  Surgeon: Lyn Records, MD;  Location: [facility] INVASIVE CV LAB;  Service: Cardiovascular;  Laterality: N/A;   COLONOSCOPY     remote   COLONOSCOPY N/A 05/26/2016   Procedure: COLONOSCOPY;  Surgeon: Corbin Ade, MD;  Location: [facility] ENDO SUITE;  Service: Endoscopy;  Laterality: N/A;  845   ESOPHAGOGASTRODUODENOSCOPY N/A  05/26/2016   Procedure: ESOPHAGOGASTRODUODENOSCOPY (EGD);  Surgeon: Corbin Ade, MD;  Location: [facility] ENDO SUITE;  Service: Endoscopy;  Laterality: N/A;   ESOPHAGOGASTRODUODENOSCOPY (EGD) WITH PROPOFOL N/A 11/22/2022   Procedure: ESOPHAGOGASTRODUODENOSCOPY (EGD) WITH PROPOFOL;  Surgeon: Lanelle Bal, DO;  Location: [facility] ENDO SUITE;  Service: Endoscopy;  Laterality: N/A;   EYE SURGERY     EYE SURGERY Left    Dr. Barbaraann Barthel   HOT HEMOSTASIS  11/22/2022   Procedure: HOT HEMOSTASIS (ARGON PLASMA COAGULATION/BICAP);  Surgeon: Lanelle Bal, DO;  Location: [facility] ENDO [facility]ITE;  Service: Endoscopy;;   MALONEY DILATION N/A 05/26/2016   Procedure: Elease Hashimoto DILATION;  Surgeon: Corbin Ade, MD;  Location: [facility] ENDO [facility]ITE;  Service: Endoscopy;  Laterality: N/A;   TOOTH EXTRACTION N/A 01/15/2022   Procedure: DENTAL RESTORATION/EXTRACTIONS;  Surgeon: Ocie Doyne, DMD;  Location: [facility] OR;  Service: Oral Surgery;  Laterality: N/A;   VITRECTOMY Left 10/03/2019   Dr. Luciana Axe, Vitrectomy, Focal Laser, Removal of Silicone Oil     Home Medications:  Prior to Admission medications   Medication Sig Start Date End Date Taking? Authorizing Provider  acetaminophen (TYLENOL) 500 MG tablet Take 1,000 mg by mouth every 8 (eight) hours as needed for mild pain or moderate pain. 03/21/17   [provider]  albuterol (PROVENTIL) (2.5 MG/3ML) 0.083% nebulizer solution Take 2.5 mg by nebulization every 6 (six) hours as needed for wheezing or shortness of breath.    [provider]  apixaban (ELIQUIS) 5 MG TABS tablet Take 1 tablet (5 mg total) by mouth 2 (two) times daily. 11/25/22   Lewie Chamber, MD  atropine 1 % ophthalmic solution Place 1 drop into both eyes 3 (three) times daily.    [provider]  benzonatate (TESSALON) 100 MG capsule Take 1 capsule (100 mg total) by mouth every 8 (eight) hours. 01/18/23   Bethann Berkshire, MD  buPROPion (WELLBUTRIN XL) 150 MG 24 hr tablet Take 150 mg by mouth  daily. 11/18/22   [provider]  Cholecalciferol (VITAMIN D3) 50 MCG (2000 UT) TABS Take 2,000 Units by mouth daily.    [provider]  Copper Gluconate (COPPER CAPS PO) Take 6 mg by mouth daily.    [provider]  denosumab (PROLIA) 60 MG/ML SOSY injection Inject 60 mg into the skin every 6 (six) months. Patient not taking: Reported on 01/20/2023    [provider]  diltiazem (CARDIZEM CD) 120 MG 24 hr capsule Take 1 capsule (120 mg total) by mouth daily. 01/28/23   Tyrone Nine, MD  ferrous sulfate 325 (65 FE) MG EC tablet Take 1 tablet (325 mg total) by mouth daily with breakfast. 11/22/22 11/22/23  Lewie Chamber, MD  levothyroxine (SYNTHROID) 50 MCG tablet Take 50 mcg by mouth daily before breakfast.  09/20/19   [provider]  nitroGLYCERIN (NITROSTAT) 0.4 MG SL tablet Place 0.4 mg under the tongue every 5 (five) minutes as needed for chest pain. 06/09/19   [provider]  pantoprazole (PROTONIX) 40 MG tablet Take 1 tablet (40 mg total) by mouth 2 (two) times daily. 11/22/22   Lewie Chamber, MD  prednisoLONE acetate (PRED FORTE) 1 % ophthalmic suspension Place 1 drop into the right eye in the morning and at bedtime. 12/20/22 03/20/23  [provider]  rosuvastatin (CRESTOR) 20 MG tablet Take 20 mg by mouth daily.  02/26/17   [provider]  sertraline (ZOLOFT) 25 MG tablet Take 25 mg by mouth daily.    [provider]  [name] 200-62.5-25 MCG/ACT AEPB Inhale 1 puff into the lungs daily. 11/02/22   [provider]    Inpatient Medications: Scheduled Meds:  fluticasone furoate-vilanterol  1 puff Inhalation Daily   And   umeclidinium bromide  1 puff Inhalation Daily   levothyroxine  50 mcg Oral Q0600   pantoprazole  40 mg Oral BID   rosuvastatin  20 mg Oral Daily   Continuous Infusions:  amiodarone 30 mg/hr (02/03/23 0516)   heparin 800 Units/hr (02/02/23 2339)   PRN Meds: acetaminophen  **OR** acetaminophen, albuterol, ondansetron **OR** ondansetron (ZOFRAN) IV  Allergies:    Allergies  Allergen Reactions   Povidone-Iodine Other (See Comments)    Burning   Sulfa Antibiotics Other (See Comments)    Unknown reaction    Sulfamethoxazole Other (See Comments)    unknown    Social History:   Social History   Socioeconomic History   Marital status: Divorced    Spouse name: Not on file   Number of children: 1   Years of education: Not on file   Highest education level: Not on file  Occupational History   Occupation: Retired  Tobacco Use   Smoking status: Former    Current packs/day: 0.00    Types: Cigarettes    Quit date: 12/28/2015    Years since quitting: 7.1    Passive exposure: Past   Smokeless tobacco: Never   Tobacco comments:    Patient quit within the last 10 years  Vaping Use   Vaping status: Never Used  Substance and Sexual Activity   Alcohol use: Never   Drug use: Never   Sexual activity: Not Currently  Other Topics Concern   Not on file  Social History Narrative   ** Merged History Encounter **       Social Determinants of Health   Financial Resource Strain: Low Risk  (11/26/2022)   Overall Financial Resource Strain (CARDIA)    Difficulty of Paying Living Expenses: Not hard at all  Food Insecurity: No Food Insecurity (02/02/2023)   Hunger Vital Sign    Worried About Running Out of Food in the Last Year: Never true    Ran Out of Food in the Last Year: Never true  Transportation Needs: No Transportation Needs (02/02/2023)   PRAPARE - Administrator, Civil Service (Medical): No    Lack of Transportation (Non-Medical): No  Physical Activity: Not on file  Stress: No Stress Concern Present (11/26/2022)   [employer] of Occupational Health - Occupational Stress Questionnaire    Feeling of Stress : Not at all  Social Connections: Moderately Integrated (11/26/2022)   Social Connection and Isolation Panel [NHANES]    Frequency  of Communication with Friends and Family: Twice a week    Frequency of Social Gatherings with Friends and Family: Twice a week    Attends Religious Services: 1 to 4 times per year    Active Member of [employer] or Organizations: Yes  Attends Banker Meetings: 1 to 4 times per year    Marital Status: Divorced  Intimate Partner Violence: Not At Risk (02/02/2023)   Humiliation, Afraid, Rape, and Kick questionnaire    Fear of Current or Ex-Partner: No    Emotionally Abused: No    Physically Abused: No    Sexually Abused: No    Family History:    Family History  Problem Relation Age of Onset   Heart disease Mother    COPD Father    Cancer Sister        unknown primary   Cancer Brother        unknown primary   Cancer Brother    Hypertension Son    Colon cancer Neg Hx      ROS:  Please see the history of present illness.   All other ROS reviewed and negative.     Physical Exam/Data:   Vitals:   02/03/23 0500 02/03/23 0515 02/03/23 0530 02/03/23 0600  BP: 105/73 (!) 96/58 114/77 103/71  Pulse: 84 85 86 87  Resp: (!) 24 17 (!) 22 18  Temp:      TempSrc:      SpO2: 98% 93% 98% 98%  Weight:      Height:        Intake/Output Summary (Last 24 hours) at 02/03/2023 0751 Last data filed at 02/03/2023 0028 Gross per 24 hour  Intake 40.32 ml  Output --  Net 40.32 ml      02/02/2023    6:29 PM 01/27/2023    5:17 AM 01/25/2023    3:00 AM  Last 3 Weights  Weight (lbs) 132 lb 15 oz 133 lb 135 lb 2.3 oz  Weight (kg) 60.3 kg 60.328 kg 61.3 kg     Body mass index is 20.82 kg/m.  General:  Well nourished, well developed, in no acute distress HEENT: normal Neck: no JVD Vascular: No carotid bruits; Distal pulses 2+ bilaterally Cardiac:  normal S1, S2; Irreg Irreg; no murmur  Lungs:  clear to auscultation bilaterally, no wheezing, rhonchi or rales  Abd: soft, nontender, no hepatomegaly  Ext: no edema Musculoskeletal:  No deformities, BUE and BLE strength normal  and equal Skin: warm and dry  Neuro:  CNs 2-12 intact, no focal abnormalities noted Psych:  Normal affect   EKG:  The EKG was personally reviewed and demonstrates:  Afib RVR 144bpm Telemetry:  Telemetry was personally reviewed and demonstrates:  Afib HR 120s  Relevant CV Studies:  Echo 09/2022 1. Left ventricular ejection fraction, by estimation, is 60 to 65%. The  left ventricle has normal function. The left ventricle has no regional  wall motion abnormalities. There is mild left ventricular hypertrophy.  Left ventricular diastolic parameters  are indeterminate. Elevated left atrial pressure.   2. Right ventricular systolic function is normal. The right ventricular  size is normal.   3. The mitral valve is abnormal. Mild mitral valve regurgitation. No  evidence of mitral stenosis.   4. The aortic valve is tricuspid. There is mild calcification of the  aortic valve. There is mild thickening of the aortic valve. Aortic valve  regurgitation is not visualized. No aortic stenosis is present.   5. The inferior vena cava is normal in size with greater than 50%  respiratory variability, suggesting right atrial pressure of 3 mmHg.   Echo 07/2022  1. Left ventricular ejection fraction, by estimation, is 70 to 75%. The  left ventricle has hyperdynamic function. The left ventricle has  no  regional wall motion abnormalities. Indeterminate diastolic filling due to  E-A fusion. The E/e' is 23.6.   2. Right ventricular systolic function is normal. The right ventricular  size is normal. Tricuspid regurgitation signal is inadequate for assessing  PA pressure.   3. Left atrial size was mildly dilated.   4. The mitral valve is abnormal. Trivial mitral valve regurgitation. Mild  mitral stenosis. Moderate to severe mitral annular calcification.   5. The aortic valve is tricuspid. There is mild calcification of the  aortic valve. Aortic valve regurgitation is not visualized. No aortic  stenosis is  present.   6. The inferior vena cava is normal in size with greater than 50%  respiratory variability, suggesting right atrial pressure of 3 mmHg.   Comparison(s): Changes from prior study are noted. Mitral stenosis is mild  now compared to moderate in 06/2021.   LHC 2017  The left ventricular ejection fraction is 55-65% by visual estimate. The left ventricular systolic function is normal. A STENT RESOLUTE ONYX 2.75X12 drug eluting stent was successfully placed, and does not overlap previously placed stent. Prox Cx to Mid Cx lesion, 99 %stenosed. Post intervention, there is a 0% residual stenosis.   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 Otherwise widely patent coronary arteries Successful angioplasty and stenting of the proximal circumflex reducing a greater than 95% stenosis to 0% with TIMI grade 3 flow. Resolute Onyx 2.75 x 12 mm stent deployed at 14 atm.   Recommendations:   Aspirin, Brilinta, and Eliquis x 1 month then drop aspirin. See further instruction below for 3 months and beyond. Eliquis should start in AM as long as no recurrent AF/Afl. Would also consider switch to Plavix at 3 months to decrease the risk of bleeding on Eliquis.  Laboratory Data:  High Sensitivity Troponin:   Recent Labs  Lab 01/20/23 1449 01/20/23 1632 02/02/23 1910 02/02/23 2105  TROPONINIHS 33* 26* 37* 32*     Chemistry Recent Labs  Lab 01/27/23 0802 02/02/23 1910 02/03/23 0540  NA 134* 135 136  K 5.3* 4.2 3.7  CL 102 108 109  CO2 20* 16* 17*  GLUCOSE 96 190* 104*  BUN 57* 45* 41*  CREATININE 1.56* 1.83* 1.69*  CALCIUM 9.2 9.0 9.0  MG  --   --  2.3  GFRNONAA 34* 28* 31*  ANIONGAP 12 11 10     Recent Labs  Lab 02/03/23 0540  PROT 5.6*  ALBUMIN 2.9*  AST 33  ALT 27  ALKPHOS 63  BILITOT 0.4   Lipids No results for input(s): "CHOL", "TRIG", "HDL", "LABVLDL", "LDLCALC", "CHOLHDL" in the last 168 hours.  Hematology Recent Labs  Lab  02/02/23 1955 02/03/23 0540  WBC 9.4 6.9  RBC 3.53* 3.43*  HGB 10.0* 9.8*  HCT 33.9* 33.1*  MCV 96.0 96.5  MCH 28.3 28.6  MCHC 29.5* 29.6*  RDW 17.8* 17.8*  PLT 179 146*   Thyroid No results for input(s): "TSH", "FREET4" in the last 168 hours.  BNP Recent Labs  Lab 02/02/23 1955  BNP 442.0*    DDimer No results for input(s): "DDIMER" in the last 168 hours.   Radiology/Studies:  DG Chest Portable 1 View  Result Date: 02/02/2023 CLINICAL DATA:  Shortness of breath EXAM: PORTABLE CHEST 1 VIEW COMPARISON:  01/20/2023, chest CT 09/12/2022 FINDINGS: Emphysema and bronchitic changes. Minimal atelectasis or scarring at the bases. No consolidation, pleural effusion or pneumothorax. Normal cardiac size with aortic atherosclerosis. IMPRESSION: No active disease. Emphysema and  bronchitic changes. Electronically Signed   By: Jasmine Pang M.D.   On: 02/02/2023 20:26     Assessment and Plan:   Paroxysmal Afib - presented with shortness of breath found to be in rapid A-fib started on IV Dilt, patient developed hypotension and was transition to IV amiodarone - patient remains in Afib with rates 110-120s, still visibly SOB on 2L O2 -PTA Eliquis held>> IV heparin in the interim  -Keep mag greater than 2 and K greater than 4 - continue IV amiodarone - BP improved, can likely restart PTA dilt - if patient does not medically convert, may need to consider DCCV. I suspect she will need long-term AA given recurrent afib  Elevated troponin CAD s/p DES to Lcx 2017 - minimally elevated troponin with flat trend not consistent with ACS - patient denies chest pain - suspect demand ischemia>no plan for ischemic work-up -No aspirin with Eliquis -Continue Crestor  Elevated BNP - BNP 442 and CXR clear -Echo in June 2024 showed normal LVEF, mild LVH, indeterminate diastolic parameters, mild MR. - patient does not appear volume up on exam - AKI/dehydration on admission, improving this AM. Continue  to trend  AKI on CKD stage IV - scr 1.83, BUN 45 - improving, Scr 1.63/BUN 41 - daily BMET  HTN - low on IV dilt, now resolved   For questions or updates, please contact [facility] Please consult [email or web address] for contact info under    Signed, Jai Steil David Stall, PA-C  02/03/2023 7:51 AM

## 2023-02-03 NOTE — Progress Notes (Signed)
PHARMACY - ANTICOAGULATION CONSULT NOTE  Pharmacy Consult for Heparin (holding Apixaban) Indication: atrial fibrillation  Allergies  Allergen Reactions   Povidone-Iodine Other (See Comments)    Burning   Sulfa Antibiotics Other (See Comments)    Unknown reaction    Sulfamethoxazole Other (See Comments)    unknown    Patient Measurements: Height: 5\' 7"  (170.2 cm) Weight: 59.7 kg (131 lb 9.8 oz) IBW/kg (Calculated) : 61.6  Vital Signs: Temp: 97.7 F (36.5 C) (10/31 1700) Temp Source: Oral (10/31 1700) BP: 141/97 (10/31 1830) Pulse Rate: 62 (10/31 1830)  Labs: Recent Labs    02/02/23 1910 02/02/23 1955 02/02/23 2105 02/03/23 0540 02/03/23 0749 02/03/23 1953  HGB  --  10.0*  --  9.8*  --   --   HCT  --  33.9*  --  33.1*  --   --   PLT  --  179  --  146*  --   --   APTT  --   --   --   --  >200* 179*  HEPARINUNFRC  --   --   --   --  >1.10*  --   CREATININE 1.83*  --   --  1.69*  --   --   TROPONINIHS 37*  --  32*  --   --   --     Estimated Creatinine Clearance: 25.4 mL/min (A) (by C-G formula based on SCr of 1.69 mg/dL (H)).   Medical History: Past Medical History:  Diagnosis Date   A-fib [facility])    Atrial flutter (HCC)    On Eliquis in 8/17 but discontinued after stent placement   Breast cancer (HCC)    remote   CAD in native artery 03/16/2016   COPD (chronic obstructive pulmonary disease) (HCC)    Dysrhythmia    Hypertension    Iron deficiency anemia 06/12/2021   NSTEMI (non-ST elevated myocardial infarction) (HCC)    2017   S/P angioplasty with stent 03/15/16 DES Resolute, pLCX 03/16/2016   Vitreous hemorrhage of left eye (HCC) 08/01/2019     Assessment: 82 y/o F presents to the ED with increasing shortness of breath over the past week. Recent discharge. On apixaban PTA. Anticipate using aPTT to dose for now. Above labs reviewed.    1031 1953 aPTT 179s  Goal of Therapy:  Heparin level 0.3-0.7 units/ml aPTT 66-102 seconds Monitor platelets by  anticoagulation protocol: Yes   Plan:  Hold heparin for 1 hour  Then restart heparin drip at 500 units/hr Heparin level and aPTT daily , repeat aPTT in 8 hours Daily CBC, heparin level, and aPTT Monitor for bleeding  Barrie Folk, PharmD Clinical Pharmacist

## 2023-02-03 NOTE — Progress Notes (Addendum)
PROGRESS NOTE    Patricia Jackson  HQI:[id number] DOB: [birth year] DOA: 02/02/2023 PCP: Benita Stabile, MD   Brief Narrative:    Patricia Jackson is a 82 y.o. female with medical history significant of hypertension, COPD, chronic kidney disease, GI bleed, pulmonary fibrosis due to shortness of breath since she woke up this morning, niece at bedside states that she could barely walk 10 feet from her bedroom to the bathroom without being short of breath, she also complained of minimal chest pressure without radiation.   Patient was recently admitted from 10/17 to 10/24 due to paroxysmal atrial fibrillation with RVR requiring DCCV on 10/18 and was asked to continue with diltiazem 120 mg daily and Eliquis (patient states she has been compliant with these).  She also presented with acute hypoxic respiratory failure that was initially due to pulmonary edema and eventually due to acute exacerbation of COPD (treated with doxycycline and steroids as well as bronchodilators), PAF and atelectasis.  She has been readmitted with paroxysmal atrial fibrillation with RVR and remains on IV amiodarone.  Cardiology planning for repeat cardioversion in a.m. if she does not convert.  Assessment & Plan:   Principal Problem:   Paroxysmal atrial fibrillation with RVR (HCC) Active Problems:   Elevated troponin   Essential hypertension   CAD in native artery   Acute kidney injury superimposed on stage 4 chronic kidney disease (HCC)   Hyperglycemia   Chronic obstructive pulmonary disease (HCC)   Acquired hypothyroidism   Elevated brain natriuretic peptide (BNP) level  Assessment and Plan:   Paroxysmal atrial fibrillation with RVR Patient was initially started on IV Cardizem drip, but this was transitioned to IV amiodarone due to hypotension Continue heparin drip Cardiology planning for TEE/DCCV in a.m. if she does not convert   Elevated troponin rule out ACS Patient complained of shortness of breath on  exertion Troponin 37  (this was 33 > 26 on 01/20/2023); IV heparin was started in the ED per cardiologist's recommendation per EDP Troponin eventually dropped to 32 and may just be due to type II demand ischemia Appreciate cardiology evaluation   Elevated BNP BNP 442 (this was 905 on 10/17) Echocardiogram done on 09/13/2022 showed LVEF of 60 to 65%.  No RWMA.  Mild LVH. Continue heart healthy/modified carb diet         Acute kidney injury superimposed on CKD stage IV BUN/creatinine 45/1.83 (baseline creatinine at 1.2-1.5). Renally adjust medications, avoid nephrotoxic agents/dehydration/hypotension   Hyperglycemia possibly reactive Patient has no history of diabetes mellitus Hemoglobin A1c will be checked Continue to monitor blood glucose levels   CAD s/p DES to LCx 2017  Continue Crestor   COPD Continue albuterol, Breo and Incruse Ellipta   Essential hypertension BP meds will be held at this time due to soft BP   Acquired hypothyroidism Continue Synthroid    DVT prophylaxis:Heparin drip Code Status: Full Family Communication: None at bedside Disposition Plan:  Status is: Observation The patient will require care spanning > 2 midnights and should be moved to inpatient because: Need for IV medications and inpatient procedure.   Consultants:  Cardiology  Procedures:  None  Antimicrobials:  None   Subjective: Patient seen and evaluated today with no new acute complaints or concerns. No acute concerns or events noted overnight.  She denies any chest pain, shortness of breath, cough, or palpitations.  Objective: Vitals:   02/03/23 0844 02/03/23 0930 02/03/23 1000 02/03/23 1030  BP:  (!) 136/95 (!) 128/111 133/73  Pulse:  87  Resp:  14 (!) 22 (!) 24  Temp:      TempSrc:      SpO2: 94% 100% 99% 98%  Weight:      Height:        Intake/Output Summary (Last 24 hours) at 02/03/2023 1059 Last data filed at 02/03/2023 0946 Gross per 24 hour  Intake 590.87 ml   Output --  Net 590.87 ml   Filed Weights   02/02/23 1829 02/03/23 0809  Weight: 60.3 kg 59.7 kg    Examination:  General exam: Appears calm and comfortable  Respiratory system: Clear to auscultation. Respiratory effort normal.  Nasal cannula Cardiovascular system: S1 & S2 heard, irregular and tachycardic Gastrointestinal system: Abdomen is soft Central nervous system: Alert and awake Extremities: No edema Skin: No significant lesions noted Psychiatry: Flat affect.    Data Reviewed: I have personally reviewed following labs and imaging studies  CBC: Recent Labs  Lab 02/02/23 1955 02/03/23 0540  WBC 9.4 6.9  HGB 10.0* 9.8*  HCT 33.9* 33.1*  MCV 96.0 96.5  PLT 179 146*   Basic Metabolic Panel: Recent Labs  Lab 02/02/23 1910 02/03/23 0540  NA 135 136  K 4.2 3.7  CL 108 109  CO2 16* 17*  GLUCOSE 190* 104*  BUN 45* 41*  CREATININE 1.83* 1.69*  CALCIUM 9.0 9.0  MG  --  2.3  PHOS  --  3.7   GFR: Estimated Creatinine Clearance: 25.4 mL/min (A) (by C-G formula based on SCr of 1.69 mg/dL (H)). Liver Function Tests: Recent Labs  Lab 02/03/23 0540  AST 33  ALT 27  ALKPHOS 63  BILITOT 0.4  PROT 5.6*  ALBUMIN 2.9*   No results for input(s): "LIPASE", "AMYLASE" in the last 168 hours. No results for input(s): "AMMONIA" in the last 168 hours. Coagulation Profile: No results for input(s): "INR", "PROTIME" in the last 168 hours. Cardiac Enzymes: No results for input(s): "CKTOTAL", "CKMB", "CKMBINDEX", "TROPONINI" in the last 168 hours. BNP (last 3 results) No results for input(s): "PROBNP" in the last 8760 hours. HbA1C: No results for input(s): "HGBA1C" in the last 72 hours. CBG: No results for input(s): "GLUCAP" in the last 168 hours. Lipid Profile: No results for input(s): "CHOL", "HDL", "LDLCALC", "TRIG", "CHOLHDL", "LDLDIRECT" in the last 72 hours. Thyroid Function Tests: Recent Labs    02/03/23 0540  TSH 2.628   Anemia Panel: No results for  input(s): "VITAMINB12", "FOLATE", "FERRITIN", "TIBC", "IRON", "RETICCTPCT" in the last 72 hours. Sepsis Labs: No results for input(s): "PROCALCITON", "LATICACIDVEN" in the last 168 hours.  Recent Results (from the past 240 hour(s))  SARS Coronavirus 2 by RT PCR (hospital order, performed in [facility] hospital lab) *cepheid single result test* Anterior Nasal Swab     Status: None   Collection Time: 02/02/23  7:31 PM   Specimen: Anterior Nasal Swab  Result Value Ref Range Status   SARS Coronavirus 2 by RT PCR NEGATIVE NEGATIVE Final    Comment: (NOTE) SARS-CoV-2 target nucleic acids are NOT DETECTED.  The SARS-CoV-2 RNA is generally detectable in upper and lower respiratory specimens during the acute phase of infection. The lowest concentration of SARS-CoV-2 viral copies this assay can detect is 250 copies / mL. A negative result does not preclude SARS-CoV-2 infection and should not be used as the sole basis for treatment or other patient management decisions.  A negative result may occur with improper specimen collection / handling, submission of specimen other than nasopharyngeal swab, presence of viral mutation(s) within the  areas targeted by this assay, and inadequate number of viral copies (<250 copies / mL). A negative result must be combined with clinical observations, patient history, and epidemiological information.  Fact Sheet for Patients:   RoadLapTop.co.za  Fact Sheet for Healthcare Providers: http://kim-miller.com/  This test is not yet approved or  cleared by the [place] FDA and has been authorized for detection and/or diagnosis of SARS-CoV-2 by FDA under an Emergency Use Authorization (EUA).  This EUA will remain in effect (meaning this test can be used) for the duration of the COVID-19 declaration under Section 564(b)(1) of the Act, 21 U.S.C. section 360bbb-3(b)(1), unless the authorization is terminated  or revoked sooner.  Performed at [facility], [address] [place], [place] [address]          Radiology Studies: DG Chest Portable 1 View  Result Date: 02/02/2023 CLINICAL DATA:  Shortness of breath EXAM: PORTABLE CHEST 1 VIEW COMPARISON:  01/20/2023, chest CT 09/12/2022 FINDINGS: Emphysema and bronchitic changes. Minimal atelectasis or scarring at the bases. No consolidation, pleural effusion or pneumothorax. Normal cardiac size with aortic atherosclerosis. IMPRESSION: No active disease. Emphysema and bronchitic changes. Electronically Signed   By: Jasmine Pang M.D.   On: 02/02/2023 20:26        Scheduled Meds:  Chlorhexidine Gluconate Cloth  6 each Topical Daily   fluticasone furoate-vilanterol  1 puff Inhalation Daily   And   umeclidinium bromide  1 puff Inhalation Daily   levothyroxine  50 mcg Oral Q0600   pantoprazole  40 mg Oral BID   rosuvastatin  20 mg Oral Daily   Continuous Infusions:  amiodarone 30 mg/hr (02/03/23 0516)   heparin       LOS: 0 days    Time spent: 55 minutes    Oluwatosin Bracy D Sherryll Burger, DO [facility]  If 7PM-7AM, please contact night-coverage [email or web address] 02/03/2023, 10:59 AM

## 2023-02-03 NOTE — Progress Notes (Signed)
PHARMACY - ANTICOAGULATION CONSULT NOTE  Pharmacy Consult for Heparin (holding Apixaban) Indication: atrial fibrillation  Allergies  Allergen Reactions   Povidone-Iodine Other (See Comments)    Burning   Sulfa Antibiotics Other (See Comments)    Unknown reaction    Sulfamethoxazole Other (See Comments)    unknown    Patient Measurements: Height: 5\' 7"  (170.2 cm) Weight: 60.3 kg (132 lb 15 oz) IBW/kg (Calculated) : 61.6  Vital Signs: Temp: 98.7 F (37.1 C) (10/30 1828) Temp Source: Oral (10/30 1828) BP: 102/64 (10/31 0045) Pulse Rate: 102 (10/31 0045)  Labs: Recent Labs    02/02/23 1910 02/02/23 1955 02/02/23 2105  HGB  --  10.0*  --   HCT  --  33.9*  --   PLT  --  179  --   CREATININE 1.83*  --   --   TROPONINIHS 37*  --  32*    Estimated Creatinine Clearance: 23.7 mL/min (A) (by C-G formula based on SCr of 1.83 mg/dL (H)).   Medical History: Past Medical History:  Diagnosis Date   A-fib [facility])    Atrial flutter (HCC)    On Eliquis in 8/17 but discontinued after stent placement   Breast cancer (HCC)    remote   CAD in native artery 03/16/2016   COPD (chronic obstructive pulmonary disease) (HCC)    Dysrhythmia    Hypertension    Iron deficiency anemia 06/12/2021   NSTEMI (non-ST elevated myocardial infarction) (HCC)    2017   S/P angioplasty with stent 03/15/16 DES Resolute, pLCX 03/16/2016   Vitreous hemorrhage of left eye (HCC) 08/01/2019     Assessment: 82 y/o F presents to the ED with increasing shortness of breath over the past week. Recent discharge. On apixaban PTA. Anticipate using aPTT to dose for now. Above labs reviewed.   Goal of Therapy:  Heparin level 0.3-0.7 units/ml aPTT 66-102 seconds Monitor platelets by anticoagulation protocol: Yes   Plan:  Start heparin drip at 800 units/hr Heparin level and aPTT in 8 hours Daily CBC, heparin level, and aPTT Monitor for bleeding  Abran Duke, PharmD, BCPS Clinical Pharmacist Phone:  [phone]

## 2023-02-03 NOTE — Plan of Care (Signed)

## 2023-02-03 NOTE — ED Notes (Signed)
Dr. Thomes Dinning made aware of pts HR and trending BP while on Cardizem gtt. Orders received

## 2023-02-03 NOTE — Progress Notes (Signed)
PHARMACY - ANTICOAGULATION CONSULT NOTE  Pharmacy Consult for Heparin (holding Apixaban) Indication: atrial fibrillation  Allergies  Allergen Reactions   Povidone-Iodine Other (See Comments)    Burning   Sulfa Antibiotics Other (See Comments)    Unknown reaction    Sulfamethoxazole Other (See Comments)    unknown    Patient Measurements: Height: 5\' 7"  (170.2 cm) Weight: 59.7 kg (131 lb 9.8 oz) IBW/kg (Calculated) : 61.6  Vital Signs: Temp: 98.2 F (36.8 C) (10/31 0830) Temp Source: Oral (10/31 0830) BP: 133/73 (10/31 1030) Pulse Rate: 87 (10/31 0930)  Labs: Recent Labs    02/02/23 1910 02/02/23 1955 02/02/23 2105 02/03/23 0540 02/03/23 0749  HGB  --  10.0*  --  9.8*  --   HCT  --  33.9*  --  33.1*  --   PLT  --  179  --  146*  --   APTT  --   --   --   --  >200*  HEPARINUNFRC  --   --   --   --  >1.10*  CREATININE 1.83*  --   --  1.69*  --   TROPONINIHS 37*  --  32*  --   --     Estimated Creatinine Clearance: 25.4 mL/min (A) (by C-G formula based on SCr of 1.69 mg/dL (H)).   Medical History: Past Medical History:  Diagnosis Date   A-fib [facility])    Atrial flutter (HCC)    On Eliquis in 8/17 but discontinued after stent placement   Breast cancer (HCC)    remote   CAD in native artery 03/16/2016   COPD (chronic obstructive pulmonary disease) (HCC)    Dysrhythmia    Hypertension    Iron deficiency anemia 06/12/2021   NSTEMI (non-ST elevated myocardial infarction) (HCC)    2017   S/P angioplasty with stent 03/15/16 DES Resolute, pLCX 03/16/2016   Vitreous hemorrhage of left eye (HCC) 08/01/2019     Assessment: 82 y/o F presents to the ED with increasing shortness of breath over the past week. Recent discharge. On apixaban PTA. Anticipate using aPTT to dose for now. Above labs reviewed.   APTT >200, supratherapeutic HL > 1.1  Goal of Therapy:  Heparin level 0.3-0.7 units/ml aPTT 66-102 seconds Monitor platelets by anticoagulation protocol: Yes    Plan:  Hold heparin for 1 hour  Then restart heparin drip at 600 units/hr Heparin level and aPTT daily , repeat aPTT in 8 hours Daily CBC, heparin level, and aPTT Monitor for bleeding  Elder Cyphers, BS Loura Back, BCPS Clinical Pharmacist Pager [phone]

## 2023-02-03 NOTE — TOC Initial Note (Signed)
Transition of Care [facility]) - Initial/Assessment Note    Patient Details  Name: Patricia Jackson MRN: [id number] Date of Birth: 05-29-1940  [facility]) CM/SW Contact:    Elliot Gault, LCSW Phone Number: 02/03/2023, 10:32 AM  Clinical Narrative:                  Pt known to TOC. She is currently observation status though will likely change to inpatient with high readmission risk.  Pt lives with her grandson and his wife. Pt states she is mostly independent in completing her ADLs. Pt states that if she cannot drive to appointments her family will get her there. Pt has had HH in the past. Pt has a wheelchair and a walker at home. Pt declined HH at dc from her admission last week.  TOC will follow and assist if needs arise.  Expected Discharge Plan: Home/Self Care Barriers to Discharge: Continued Medical Work up   Patient Goals and CMS Choice Patient states their goals for this hospitalization and ongoing recovery are:: get better          Expected Discharge Plan and Services In-house Referral: Clinical Social Work     Living arrangements for the past 2 months: Single Family Home                                      Prior Living Arrangements/Services Living arrangements for the past 2 months: Single Family Home Lives with:: Relatives Patient language and need for interpreter reviewed:: Yes Do you feel safe going back to the place where you live?: Yes      Need for Family Participation in Patient Care: Yes (Comment) Care giver support system in place?: Yes (comment) Current home services: DME Criminal Activity/Legal Involvement Pertinent to Current Situation/Hospitalization: No - Comment as needed  Activities of Daily Living   ADL Screening (condition at time of admission) Independently performs ADLs?: Yes (appropriate for developmental age) Is the patient deaf or have difficulty hearing?: No Does the patient have difficulty seeing, even when  wearing glasses/contacts?: No Does the patient have difficulty concentrating, remembering, or making decisions?: No  Permission Sought/Granted                  Emotional Assessment Appearance:: Appears stated age Attitude/Demeanor/Rapport: Engaged Affect (typically observed): Accepting Orientation: : Oriented to Self, Oriented to Place, Oriented to  Time, Oriented to Situation Alcohol / Substance Use: Not Applicable Psych Involvement: No (comment)  Admission diagnosis:  Paroxysmal atrial fibrillation with RVR (HCC) [I48.0] Patient Active Problem List   Diagnosis Date Noted   Elevated brain natriuretic peptide (BNP) level 02/03/2023   Paroxysmal atrial fibrillation with RVR (HCC) 02/02/2023   Malnutrition of moderate degree 01/24/2023   Atrial fibrillation with RVR (HCC) 01/20/2023   On anticoagulant therapy 11/21/2022   ABLA (acute blood loss anemia) 11/21/2022   Raynaud's syndrome 11/20/2022   Osteoarthritis 11/20/2022   Generalized anxiety disorder 11/18/2022   Positive antinuclear antibody 11/02/2022   Insomnia 11/02/2022   Gastroesophageal reflux disease without esophagitis 11/01/2022   Abrasion of lower back 10/11/2022   Atrial fibrillation (HCC) 09/23/2022   Vitamin D deficiency 09/23/2022   Syncope 09/13/2022   CAP (community acquired pneumonia) 09/12/2022   sepsis from pneumonia 07/26/2022   intermittent confusion 07/26/2022   Hyponatremia 07/26/2022   Preoperative evaluation of a medical condition to rule out surgical contraindications (TAR required) 05/14/2022  Fracture of distal end of radius 05/02/2022   Left wrist pain 04/28/2022   Osteoporosis 01/19/2022   DOE (dyspnea on exertion)    Acidosis, unspecified 06/20/2021   Elevated troponin 06/19/2021   Iron deficiency anemia 06/12/2021   Acquired hypothyroidism 05/15/2021   Chronic kidney disease, 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   Essential hypertension 12/10/2020    Retinal hemorrhage, right eye 11/04/2020   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   Traction detachment of left retina 08/03/2019   Left retinal detachment 08/01/2019   Age-related macular degeneration 08/01/2019   Retinal hemorrhage of left eye 08/01/2019   Advanced nonexudative age-related macular degeneration of left eye with subfoveal involvement 08/01/2019   Retinal detachment 07/26/2019   Choroidal detachment of right eye 07/26/2019   Exudative retinopathy of left eye 07/26/2019   Thrombocytopenic disorder (HCC) 07/04/2019   Orthostatic syncope 08/27/2017   Atypical chest pain 08/27/2017   Atrial flutter (HCC) 10/28/2016   Chronic kidney disease, stage 4 (severe) (HCC) 10/28/2016   Chronic obstructive pulmonary disease (HCC) 10/28/2016   Depression 10/28/2016   Acute kidney injury superimposed on stage 4 chronic kidney disease (HCC) 09/08/2016   Hyperglycemia 09/08/2016   Esophageal dysphagia    Anemia 04/07/2016   CAD in native artery 03/16/2016   S/P angioplasty with stent 03/15/16 DES Resolute, pLCX 03/16/2016   NSTEMI (non-ST elevated myocardial infarction) (HCC) 03/12/2016   Dyspnea on exertion    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HCC) 10/31/2015   Hyperlipidemia 10/31/2015   Atrial flutter with rapid ventricular response (HCC) 10/30/2015   Back pain 11/26/2013   Arm pain 11/26/2013   Chest pain 11/26/2013   PCP:  Benita Stabile, MD Pharmacy:   [facility] - [place], [place] - [address] [place] [address] Phone: [phone] Fax: [phone]     Social Determinants of Health (SDOH) Social History: SDOH Screenings   Food Insecurity: No Food Insecurity (02/02/2023)  Housing: Low Risk  (02/02/2023)  Transportation Needs: No Transportation Needs (02/02/2023)  Utilities: Not At Risk (02/02/2023)  Depression (PHQ2-9): Low Risk  (11/26/2022)  Financial Resource Strain: Low Risk  (11/26/2022)  Social Connections: Moderately Integrated  (11/26/2022)  Stress: No Stress Concern Present (11/26/2022)  Tobacco Use: Medium Risk (02/02/2023)  Health Literacy: Adequate Health Literacy (11/26/2022)   SDOH Interventions:     Readmission Risk Interventions    01/21/2023    1:10 PM 06/22/2021   12:59 PM  Readmission Risk Prevention Plan  Transportation Screening Complete Complete  PCP or Specialist Appt within 5-7 Days  Complete  Home Care Screening  Complete  Medication Review (RN CM)  Complete  Medication Review (RN Care Manager) Complete   HRI or Home Care Consult Complete   SW Recovery Care/Counseling Consult Complete   Palliative Care Screening Not Applicable   Skilled Nursing Facility Not Applicable

## 2023-02-04 ENCOUNTER — Inpatient Hospital Stay (HOSPITAL_COMMUNITY): Payer: 59

## 2023-02-04 ENCOUNTER — Other Ambulatory Visit (HOSPITAL_COMMUNITY): Payer: Self-pay | Admitting: *Deleted

## 2023-02-04 ENCOUNTER — Inpatient Hospital Stay (HOSPITAL_COMMUNITY): Payer: 59 | Admitting: Anesthesiology

## 2023-02-04 ENCOUNTER — Encounter (HOSPITAL_COMMUNITY)
Admission: EM | Disposition: A | Discharge status: Home / Self Care | Payer: Self-pay | Source: Home / Self Care | Attending: Internal Medicine

## 2023-02-04 ENCOUNTER — Encounter (HOSPITAL_COMMUNITY): Payer: Self-pay | Admitting: Internal Medicine

## 2023-02-04 DIAGNOSIS — I480 Paroxysmal atrial fibrillation: Secondary | ICD-10-CM | POA: Diagnosis not present

## 2023-02-04 DIAGNOSIS — I4891 Unspecified atrial fibrillation: Secondary | ICD-10-CM

## 2023-02-04 DIAGNOSIS — Z7901 Long term (current) use of anticoagulants: Secondary | ICD-10-CM | POA: Diagnosis not present

## 2023-02-04 DIAGNOSIS — I340 Nonrheumatic mitral (valve) insufficiency: Secondary | ICD-10-CM

## 2023-02-04 DIAGNOSIS — I2510 Atherosclerotic heart disease of native coronary artery without angina pectoris: Secondary | ICD-10-CM | POA: Diagnosis not present

## 2023-02-04 DIAGNOSIS — I4892 Unspecified atrial flutter: Secondary | ICD-10-CM | POA: Diagnosis not present

## 2023-02-04 HISTORY — PX: TEE WITHOUT CARDIOVERSION: SHX5443

## 2023-02-04 HISTORY — PX: CARDIOVERSION: SHX1299

## 2023-02-04 LAB — BASIC METABOLIC PANEL
Anion gap: 8 (ref 5–15)
BUN: 45 mg/dL — ABNORMAL HIGH (ref 8–23)
CO2: 17 mmol/L — ABNORMAL LOW (ref 22–32)
Calcium: 8.8 mg/dL — ABNORMAL LOW (ref 8.9–10.3)
Chloride: 112 mmol/L — ABNORMAL HIGH (ref 98–111)
Creatinine, Ser: 1.59 mg/dL — ABNORMAL HIGH (ref 0.44–1.00)
GFR, Estimated: 33 mL/min — ABNORMAL LOW (ref >60)
Glucose, Bld: 113 mg/dL — ABNORMAL HIGH (ref 70–99)
Potassium: 4.1 mmol/L (ref 3.5–5.1)
Sodium: 137 mmol/L (ref 135–145)

## 2023-02-04 LAB — CBC
HCT: 31.7 % — ABNORMAL LOW (ref 36.0–46.0)
Hemoglobin: 9.4 g/dL — ABNORMAL LOW (ref 12.0–15.0)
MCH: 28.7 pg (ref 26.0–34.0)
MCHC: 29.7 g/dL — ABNORMAL LOW (ref 30.0–36.0)
MCV: 96.9 fL (ref 80.0–100.0)
Platelets: 138 10*3/uL — ABNORMAL LOW (ref 150–400)
RBC: 3.27 MIL/uL — ABNORMAL LOW (ref 3.87–5.11)
RDW: 18 % — ABNORMAL HIGH (ref 11.5–15.5)
WBC: 7.8 10*3/uL (ref 4.0–10.5)
nRBC: 0 % (ref 0.0–0.2)

## 2023-02-04 LAB — HEPARIN LEVEL (UNFRACTIONATED): Heparin Unfractionated: >1.1 [IU]/mL — ABNORMAL HIGH (ref 0.30–0.70)

## 2023-02-04 LAB — APTT: aPTT: 98 s — ABNORMAL HIGH (ref 24–36)

## 2023-02-04 LAB — ECHO TEE

## 2023-02-04 LAB — MAGNESIUM: Magnesium: 2.2 mg/dL (ref 1.7–2.4)

## 2023-02-04 SURGERY — CARDIOVERSION
Anesthesia: General

## 2023-02-04 MED ORDER — APIXABAN 5 MG PO TABS: 5.0000 mg | ORAL_TABLET | Freq: Two times a day (BID) | ORAL | Status: DC

## 2023-02-04 MED ORDER — BUTAMBEN-TETRACAINE-BENZOCAINE 2-2-14 % EX AERO
INHALATION_SPRAY | CUTANEOUS | Status: DC | PRN
  Administered 2023-02-04: 1 via TOPICAL

## 2023-02-04 MED ORDER — LACTATED RINGERS IV SOLN: INTRAVENOUS | Status: DC

## 2023-02-04 MED ORDER — PHENYLEPHRINE HCL (PRESSORS) 10 MG/ML IV SOLN
INTRAVENOUS | Status: DC | PRN
Start: 2023-02-04 — End: 2023-02-04
  Administered 2023-02-04 (×4): 80 ug via INTRAVENOUS

## 2023-02-04 MED ORDER — PROPOFOL 10 MG/ML IV BOLUS
INTRAVENOUS | Status: DC | PRN
  Administered 2023-02-04: 20 mg via INTRAVENOUS
  Administered 2023-02-04: 100 mg via INTRAVENOUS

## 2023-02-04 MED ORDER — LIDOCAINE 5 % EX PTCH
1.0000 | MEDICATED_PATCH | CUTANEOUS | Status: DC
  Administered 2023-02-04 – 2023-02-05 (×2): 1 via TRANSDERMAL
  Filled 2023-02-04 (×4): qty 1

## 2023-02-04 MED ORDER — APIXABAN 2.5 MG PO TABS
2.5000 mg | ORAL_TABLET | Freq: Two times a day (BID) | ORAL | Status: DC
  Administered 2023-02-04 – 2023-02-05 (×3): 2.5 mg via ORAL
  Filled 2023-02-04 (×3): qty 1

## 2023-02-04 MED ORDER — AMIODARONE HCL 200 MG PO TABS
200.0000 mg | ORAL_TABLET | Freq: Two times a day (BID) | ORAL | Status: DC
  Administered 2023-02-04 – 2023-02-05 (×3): 200 mg via ORAL
  Filled 2023-02-04 (×3): qty 1

## 2023-02-04 NOTE — H&P (View-Only) (Signed)
Rounding Note    Patient Name: Patricia Jackson Date of Encounter: 02/04/2023  [facility] Cardiologist: Dina Rich, MD   Subjective   Patient remains in afib with elevated rates. Patient is overall comfortable. She denies chest pain, she reports minimal SOB. Plan for TEE/DCCV today.  Inpatient Medications    Scheduled Meds:  Chlorhexidine Gluconate Cloth  6 each Topical Daily   fluticasone furoate-vilanterol  1 puff Inhalation Daily   And   umeclidinium bromide  1 puff Inhalation Daily   levothyroxine  50 mcg Oral Q0600   pantoprazole  40 mg Oral BID   rosuvastatin  20 mg Oral Daily   Continuous Infusions:  sodium chloride     amiodarone 30 mg/hr (02/04/23 0315)   heparin 500 Units/hr (02/04/23 0018)   PRN Meds: acetaminophen **OR** acetaminophen, albuterol, ondansetron **OR** ondansetron (ZOFRAN) IV   Vital Signs    Vitals:   02/04/23 0530 02/04/23 0600 02/04/23 0630 02/04/23 0700  BP: 96/66 (!) 87/46 118/85 110/86  Pulse: 71 (!) 40    Resp: (!) 21 (!) 23 (!) 21 14  Temp:      TempSrc:      SpO2: 99% 98%    Weight:      Height:        Intake/Output Summary (Last 24 hours) at 02/04/2023 0754 Last data filed at 02/04/2023 0315 Gross per 24 hour  Intake 1937.96 ml  Output 200 ml  Net 1737.96 ml      02/03/2023    8:09 AM 02/02/2023    6:29 PM 01/27/2023    5:17 AM  Last 3 Weights  Weight (lbs) 131 lb 9.8 oz 132 lb 15 oz 133 lb  Weight (kg) 59.7 kg 60.3 kg 60.328 kg      Telemetry    Afib HR 100-120s - Personally Reviewed  ECG    No new - Personally Reviewed  Physical Exam   GEN: No acute distress.   Neck: No JVD Cardiac: Irreg IRreg, no murmurs, rubs, or gallops.  Respiratory: Clear to auscultation bilaterally. GI: Soft, nontender, non-distended  MS: No edema; No deformity. Neuro:  Nonfocal  Psych: Normal affect   Labs    High Sensitivity Troponin:   Recent Labs  Lab 01/20/23 1449 01/20/23 1632 02/02/23 1910  02/02/23 2105  TROPONINIHS 33* 26* 37* 32*     Chemistry Recent Labs  Lab 02/02/23 1910 02/03/23 0540 02/04/23 0500  NA 135 136 137  K 4.2 3.7 4.1  CL 108 109 112*  CO2 16* 17* 17*  GLUCOSE 190* 104* 113*  BUN 45* 41* 45*  CREATININE 1.83* 1.69* 1.59*  CALCIUM 9.0 9.0 8.8*  MG  --  2.3 2.2  PROT  --  5.6*  --   ALBUMIN  --  2.9*  --   AST  --  33  --   ALT  --  27  --   ALKPHOS  --  63  --   BILITOT  --  0.4  --   GFRNONAA 28* 31* 33*  ANIONGAP 11 10 8     Lipids No results for input(s): "CHOL", "TRIG", "HDL", "LABVLDL", "LDLCALC", "CHOLHDL" in the last 168 hours.  Hematology Recent Labs  Lab 02/02/23 1955 02/03/23 0540 02/04/23 0500  WBC 9.4 6.9 7.8  RBC 3.53* 3.43* 3.27*  HGB 10.0* 9.8* 9.4*  HCT 33.9* 33.1* 31.7*  MCV 96.0 96.5 96.9  MCH 28.3 28.6 28.7  MCHC 29.5* 29.6* 29.7*  RDW 17.8* 17.8* 18.0*  PLT  179 146* 138*   Thyroid  Recent Labs  Lab 02/03/23 0540  TSH 2.628    BNP Recent Labs  Lab 02/02/23 1955  BNP 442.0*    DDimer No results for input(s): "DDIMER" in the last 168 hours.   Radiology    DG Chest Portable 1 View  Result Date: 02/02/2023 CLINICAL DATA:  Shortness of breath EXAM: PORTABLE CHEST 1 VIEW COMPARISON:  01/20/2023, chest CT 09/12/2022 FINDINGS: Emphysema and bronchitic changes. Minimal atelectasis or scarring at the bases. No consolidation, pleural effusion or pneumothorax. Normal cardiac size with aortic atherosclerosis. IMPRESSION: No active disease. Emphysema and bronchitic changes. Electronically Signed   By: Jasmine Pang M.D.   On: 02/02/2023 20:26    Cardiac Studies   Echo 09/2022 1. Left ventricular ejection fraction, by estimation, is 60 to 65%. The  left ventricle has normal function. The left ventricle has no regional  wall motion abnormalities. There is mild left ventricular hypertrophy.  Left ventricular diastolic parameters  are indeterminate. Elevated left atrial pressure.   2. Right ventricular systolic  function is normal. The right ventricular  size is normal.   3. The mitral valve is abnormal. Mild mitral valve regurgitation. No  evidence of mitral stenosis.   4. The aortic valve is tricuspid. There is mild calcification of the  aortic valve. There is mild thickening of the aortic valve. Aortic valve  regurgitation is not visualized. No aortic stenosis is present.   5. The inferior vena cava is normal in size with greater than 50%  respiratory variability, suggesting right atrial pressure of 3 mmHg.   Patient Profile     82 y.o. female  with a hx of CAD status post DES to left circumflex in 2017, paroxysmal atrial flutter, history of GI bleed status post transfusions, COPD, breast cancer, Raynaud's syndrome, hypertension, carotid artery stenosis, hyperlipidemia who is being seen 02/03/2023 for the evaluation of paroxysmal A-fib   Assessment & Plan    Paroxysmal Afib - presented with shortness of breath found to be in rapid A-fib started on IV Dilt, patient developed hypotension and was transition to IV amiodarone - patient remains in Afib with rates 100-120s -PTA Eliquis held>> IV heparin in the interim  -Keep mag greater than 2 and K greater than 4 - continue IV amiodarone - plan for TEE/DCCV today. Can likely switch over oral Eliquis.    Elevated troponin CAD s/p DES to Lcx 2017 - minimally elevated troponin with flat trend not consistent with ACS - patient denies chest pain - suspect demand ischemia>no plan for ischemic work-up -No aspirin with Eliquis -Continue Crestor   Elevated BNP - BNP 442 and CXR clear -Echo in June 2024 showed normal LVEF, mild LVH, indeterminate diastolic parameters, mild MR. - patient does not appear volume up on exam - kidney function stable this AM   AKI on CKD stage IV - scr 1.83, BUN 45 on admission - kidney function stable   HTN - low overnight  For questions or updates, please contact [facility] Please consult  [email or web address] for contact info under        Signed, Ayaan Shutes David Stall, PA-C  02/04/2023, 7:54 AM

## 2023-02-04 NOTE — Anesthesia Procedure Notes (Signed)
Date/Time: 02/04/2023 10:41 AM  Performed by: Franco Nones, CRNAPre-anesthesia Checklist: Patient identified, Emergency Drugs available, Suction available, Timeout performed and Patient being monitored Patient Re-evaluated:Patient Re-evaluated prior to induction Oxygen Delivery Method: Non-rebreather mask

## 2023-02-04 NOTE — Progress Notes (Signed)
PROGRESS NOTE    Patricia Jackson  NWG:[id number] DOB: September 25, 1940 DOA: 02/02/2023 PCP: Benita Stabile, MD   Brief Narrative:    Patricia Jackson is a 82 y.o. female with medical history significant of hypertension, COPD, chronic kidney disease, GI bleed, pulmonary fibrosis due to shortness of breath since she woke up this morning, niece at bedside states that she could barely walk 10 feet from her bedroom to the bathroom without being short of breath, she also complained of minimal chest pressure without radiation.   Patient was recently admitted from 10/17 to 10/24 due to paroxysmal atrial fibrillation with RVR requiring DCCV on 10/18 and was asked to continue with diltiazem 120 mg daily and Eliquis (patient states she has been compliant with these).  She also presented with acute hypoxic respiratory failure that was initially due to pulmonary edema and eventually due to acute exacerbation of COPD (treated with doxycycline and steroids as well as bronchodilators), PAF and atelectasis.  She has been readmitted with paroxysmal atrial fibrillation with RVR and remains on IV amiodarone.  Patient has undergone TEE/DCCV on 11/1.  She continues to have some mild hypotension that needs close monitoring.  Assessment & Plan:   Principal Problem:   Paroxysmal atrial fibrillation with RVR (HCC) Active Problems:   Elevated troponin   Essential hypertension   CAD in native artery   Acute kidney injury superimposed on stage 4 chronic kidney disease (HCC)   Hyperglycemia   Chronic obstructive pulmonary disease (HCC)   Acquired hypothyroidism   Atrial fibrillation with RVR (HCC)   Elevated brain natriuretic peptide (BNP) level  Assessment and Plan:   Paroxysmal atrial fibrillation with RVR now in NSR status post DCCV 11/1 Continue now on Eliquis Amiodarone 200 mg twice daily   Elevated troponin rule out ACS ACS ruled out   Elevated BNP BNP 442 (this was 905 on 10/17) Echocardiogram done on  09/13/2022 showed LVEF of 60 to 65%.  No RWMA.  Mild LVH. Continue heart healthy/modified carb diet         Acute kidney injury superimposed on CKD stage IV-resolved Continue to monitor a.m. labs   CAD s/p DES to LCx 2017  Continue Crestor   COPD Continue albuterol, Breo and Incruse Ellipta   Essential hypertension currently hypotensive BP meds will be held at this time due to soft BP Continue close monitoring   Acquired hypothyroidism Continue Synthroid    DVT prophylaxis: Eliquis Code Status: Full Family Communication: None at bedside Disposition Plan:  Status is: Inpatient Remains inpatient appropriate because: Need for close monitoring and IV medications    Consultants:  Cardiology  Procedures:  TEE/DCCV 11/1  Antimicrobials:  None   Subjective: Patient seen and evaluated today with no new acute complaints or concerns. No acute concerns or events noted overnight.  She denies any chest pain, shortness of breath, cough, or palpitations.  Objective: Vitals:   02/04/23 1013 02/04/23 1125 02/04/23 1130 02/04/23 1156  BP: (!) 151/118 101/61 (!) 81/53 (!) 97/37  Pulse: 95 73 81 79  Resp: 15 18 15  (!) 24  Temp: (!) 97.5 F (36.4 C)     TempSrc: Oral     SpO2: 99% 100% 100% 99%  Weight: 59.7 kg     Height: 5\' 7"  (1.702 m)       Intake/Output Summary (Last 24 hours) at 02/04/2023 1219 Last data filed at 02/04/2023 1111 Gross per 24 hour  Intake 1197.41 ml  Output 200 ml  Net 997.41 ml  Filed Weights   02/02/23 1829 02/03/23 0809 02/04/23 1013  Weight: 60.3 kg 59.7 kg 59.7 kg    Examination:  General exam: Appears calm and comfortable  Respiratory system: Clear to auscultation. Respiratory effort normal.  Nasal cannula Cardiovascular system: S1 & S2 heard, irregular and tachycardic Gastrointestinal system: Abdomen is soft Central nervous system: Alert and awake Extremities: No edema Skin: No significant lesions noted Psychiatry: Flat  affect.    Data Reviewed: I have personally reviewed following labs and imaging studies  CBC: Recent Labs  Lab 02/02/23 1955 02/03/23 0540 02/04/23 0500  WBC 9.4 6.9 7.8  HGB 10.0* 9.8* 9.4*  HCT 33.9* 33.1* 31.7*  MCV 96.0 96.5 96.9  PLT 179 146* 138*   Basic Metabolic Panel: Recent Labs  Lab 02/02/23 1910 02/03/23 0540 02/04/23 0500  NA 135 136 137  K 4.2 3.7 4.1  CL 108 109 112*  CO2 16* 17* 17*  GLUCOSE 190* 104* 113*  BUN 45* 41* 45*  CREATININE 1.83* 1.69* 1.59*  CALCIUM 9.0 9.0 8.8*  MG  --  2.3 2.2  PHOS  --  3.7  --    GFR: Estimated Creatinine Clearance: 27 mL/min (A) (by C-G formula based on SCr of 1.59 mg/dL (H)). Liver Function Tests: Recent Labs  Lab 02/03/23 0540  AST 33  ALT 27  ALKPHOS 63  BILITOT 0.4  PROT 5.6*  ALBUMIN 2.9*   No results for input(s): "LIPASE", "AMYLASE" in the last 168 hours. No results for input(s): "AMMONIA" in the last 168 hours. Coagulation Profile: No results for input(s): "INR", "PROTIME" in the last 168 hours. Cardiac Enzymes: No results for input(s): "CKTOTAL", "CKMB", "CKMBINDEX", "TROPONINI" in the last 168 hours. BNP (last 3 results) No results for input(s): "PROBNP" in the last 8760 hours. HbA1C: Recent Labs    02/03/23 0540  HGBA1C 6.0*   CBG: No results for input(s): "GLUCAP" in the last 168 hours. Lipid Profile: No results for input(s): "CHOL", "HDL", "LDLCALC", "TRIG", "CHOLHDL", "LDLDIRECT" in the last 72 hours. Thyroid Function Tests: Recent Labs    02/03/23 0540  TSH 2.628   Anemia Panel: No results for input(s): "VITAMINB12", "FOLATE", "FERRITIN", "TIBC", "IRON", "RETICCTPCT" in the last 72 hours. Sepsis Labs: No results for input(s): "PROCALCITON", "LATICACIDVEN" in the last 168 hours.  Recent Results (from the past 240 hour(s))  SARS Coronavirus 2 by RT PCR (hospital order, performed in [facility] hospital lab) *cepheid single result test* Anterior Nasal Swab     Status: None    Collection Time: 02/02/23  7:31 PM   Specimen: Anterior Nasal Swab  Result Value Ref Range Status   SARS Coronavirus 2 by RT PCR NEGATIVE NEGATIVE Final    Comment: (NOTE) SARS-CoV-2 target nucleic acids are NOT DETECTED.  The SARS-CoV-2 RNA is generally detectable in upper and lower respiratory specimens during the acute phase of infection. The lowest concentration of SARS-CoV-2 viral copies this assay can detect is 250 copies / mL. A negative result does not preclude SARS-CoV-2 infection and should not be used as the sole basis for treatment or other patient management decisions.  A negative result may occur with improper specimen collection / handling, submission of specimen other than nasopharyngeal swab, presence of viral mutation(s) within the areas targeted by this assay, and inadequate number of viral copies (<250 copies / mL). A negative result must be combined with clinical observations, patient history, and epidemiological information.  Fact Sheet for Patients:   RoadLapTop.co.za  Fact Sheet for Healthcare Providers: http://kim-miller.com/  This test is not yet approved or  cleared by the [place] and has been authorized for detection and/or diagnosis of SARS-CoV-2 by FDA under an Emergency Use Authorization (EUA).  This EUA will remain in effect (meaning this test can be used) for the duration of the COVID-19 declaration under Section 564(b)(1) of the Act, 21 U.S.C. section 360bbb-3(b)(1), unless the authorization is terminated or revoked sooner.  Performed at [facility], [address], [place], [place] [address]   MRSA Next Gen by PCR, Nasal     Status: None   Collection Time: 02/03/23  8:00 AM   Specimen: Nasal Mucosa; Nasal Swab  Result Value Ref Range Status   MRSA by PCR Next Gen NOT DETECTED NOT DETECTED Final    Comment: (NOTE) The GeneXpert MRSA Assay (FDA approved for NASAL specimens only), is one  component of a comprehensive MRSA colonization surveillance program. It is not intended to diagnose MRSA infection nor to guide or monitor treatment for MRSA infections. Test performance is not FDA approved in patients less than 50 years old. Performed at [facility], [address] [place], [place] [address]          Radiology Studies: DG Chest Portable 1 View  Result Date: 02/02/2023 CLINICAL DATA:  Shortness of breath EXAM: PORTABLE CHEST 1 VIEW COMPARISON:  01/20/2023, chest CT 09/12/2022 FINDINGS: Emphysema and bronchitic changes. Minimal atelectasis or scarring at the bases. No consolidation, pleural effusion or pneumothorax. Normal cardiac size with aortic atherosclerosis. IMPRESSION: No active disease. Emphysema and bronchitic changes. Electronically Signed   By: Jasmine Pang M.D.   On: 02/02/2023 20:26        Scheduled Meds:  amiodarone  200 mg Oral BID   apixaban  5 mg Oral BID   Chlorhexidine Gluconate Cloth  6 each Topical Daily   fluticasone furoate-vilanterol  1 puff Inhalation Daily   And   umeclidinium bromide  1 puff Inhalation Daily   levothyroxine  50 mcg Oral Q0600   lidocaine  1 patch Transdermal Q24H   pantoprazole  40 mg Oral BID   rosuvastatin  20 mg Oral Daily     LOS: 1 day    Time spent: 55 minutes    Sissi Padia Hoover Brunette, DO [facility]  If 7PM-7AM, please contact night-coverage [email or web address] 02/04/2023, 12:19 PM

## 2023-02-04 NOTE — CV Procedure (Signed)
   TRANSESOPHAGEAL ECHOCARDIOGRAM GUIDED DIRECT CURRENT CARDIOVERSION  NAME:  Patricia Jackson    MRN: [id number] DOB:  1940-11-21    ADMIT DATE: 02/02/2023  INDICATIONS: Symptomatic atypical atrial flutter with RVR  PROCEDURE:  Informed consent was obtained prior to the procedure. After a procedural time-out, the oropharynx was anesthetized and the patient was sedated by the anesthesia service. The transesophageal probe was inserted in the esophagus without difficulty and multiple views were obtained.P Patient became briefly hypotensive during the procedure 60 mm Hg SBP with MAP 41 mm Hg on one pressor. LAA images in two views are obtained and procedure had to be aborted due to persistent low BP 60 mm Hg SBP. Once the TEE probe is removed from the esophagus, BP increased from 60 to 90 mm Hg SBP. Brief hypotension during the procedure likely from propofol use. Anesthesia was monitored by Dr. Johnnette Litter.  Once the TEE was complete, the patient had the defibrillator pads placed in the anterior and posterior position. Once an appropriate level of sedation was confirmed, the patient was cardioverted x 1 with 120J of biphasic synchronized energy. The patient converted to NSR. There were no apparent complications. The patient had normal neuro status and respiratory status post procedure with vitals stable as recorded elsewhere.  Adequate airway was maintained throughout and vital signs monitored per protocol.  RECOMMENDATIONS: Post DCCV EKG showed NSR. Stop Amiodarone drip and start p.o amiodarone 200 mg BID x 3 weeks followed by p.o amiodarone 200 mg once daily. Resume diltiazem 120 mg once daily only HR is more than 60 bpm. Switch heparin drip to Eliquis 5 mg BID.  Osmar Howton Verne Spurr, MD [place]  [facility]  11:12 AM

## 2023-02-04 NOTE — Progress Notes (Signed)
PHARMACY - ANTICOAGULATION CONSULT NOTE  Pharmacy Consult for Heparin (holding Apixaban) Indication: atrial fibrillation  Allergies  Allergen Reactions   Povidone-Iodine Other (See Comments)    Burning   Sulfa Antibiotics Other (See Comments)    Unknown reaction    Sulfamethoxazole Other (See Comments)    unknown    Patient Measurements: Height: 5\' 7"  (170.2 cm) Weight: 59.7 kg (131 lb 9.8 oz) IBW/kg (Calculated) : 61.6  Vital Signs: BP: 110/86 (11/01 0700) Pulse Rate: 40 (11/01 0600)  Labs: Recent Labs    02/02/23 1910 02/02/23 1955 02/02/23 1955 02/02/23 2105 02/03/23 0540 02/03/23 0749 02/03/23 1953 02/04/23 0500  HGB  --  10.0*   < >  --  9.8*  --   --  9.4*  HCT  --  33.9*  --   --  33.1*  --   --  31.7*  PLT  --  179  --   --  146*  --   --  138*  APTT  --   --   --   --   --  >200* 179* 98*  HEPARINUNFRC  --   --   --   --   --  >1.10*  --  >1.10*  CREATININE 1.83*  --   --   --  1.69*  --   --  1.59*  TROPONINIHS 37*  --   --  32*  --   --   --   --    < > = values in this interval not displayed.    Estimated Creatinine Clearance: 27 mL/min (A) (by C-G formula based on SCr of 1.59 mg/dL (H)).   Medical History: Past Medical History:  Diagnosis Date   A-fib [facility])    Atrial flutter (HCC)    On Eliquis in 8/17 but discontinued after stent placement   Breast cancer (HCC)    remote   CAD in native artery 03/16/2016   COPD (chronic obstructive pulmonary disease) (HCC)    Dysrhythmia    Hypertension    Iron deficiency anemia 06/12/2021   NSTEMI (non-ST elevated myocardial infarction) (HCC)    2017   S/P angioplasty with stent 03/15/16 DES Resolute, pLCX 03/16/2016   Vitreous hemorrhage of left eye (HCC) 08/01/2019     Assessment: 82 y/o F presents to the ED with increasing shortness of breath over the past week. Recent discharge. On apixaban PTA. Anticipate using aPTT to dose for now. Above labs reviewed.    1031 1953 aPTT 179s 1101 0500 aPTT  98s / HL >1.10  Goal of Therapy:  Heparin level 0.3-0.7 units/ml aPTT 66-102 seconds Monitor platelets by anticoagulation protocol: Yes   Plan: aPTT is therapeutic x 1 and aPTT and HL are not correlating Continue heparin at 500 units/hr Recheck aPTT in 8 hours Daily HL and CBC while on heparin  Will M. Dareen Piano, PharmD Clinical Pharmacist 02/04/2023 8:02 AM

## 2023-02-04 NOTE — Plan of Care (Signed)

## 2023-02-04 NOTE — Transfer of Care (Signed)
Immediate Anesthesia Transfer of Care Note  Patient: Patricia Jackson  Procedure(s) Performed: CARDIOVERSION TRANSESOPHAGEAL ECHOCARDIOGRAM (TEE)  Patient Location: PACU  Anesthesia Type:General  Level of Consciousness: awake and patient cooperative  Airway & Oxygen Therapy: Patient Spontanous Breathing  Post-op Assessment: Report given to RN and Post -op Vital signs reviewed and stable  Post vital signs: Reviewed and stable  Last Vitals:  Vitals Value Taken Time  BP 85/60 02/04/23  1123  Temp 97.5 02/04/23  1123    Pulse 79 02/04/23  1123  Resp 21 02/04/23  1123  SpO2 100 02/04/23  1123    Last Pain:  Vitals:   02/04/23 1013  TempSrc: Oral  PainSc: 5          Complications: No notable events documented.

## 2023-02-04 NOTE — Progress Notes (Signed)
Rounding Note    Patient Name: Patricia Jackson Date of Encounter: 02/04/2023  [facility] Cardiologist: Dina Rich, MD   Subjective   Patient remains in afib with elevated rates. Patient is overall comfortable. She denies chest pain, she reports minimal SOB. Plan for TEE/DCCV today.  Inpatient Medications    Scheduled Meds:  Chlorhexidine Gluconate Cloth  6 each Topical Daily   fluticasone furoate-vilanterol  1 puff Inhalation Daily   And   umeclidinium bromide  1 puff Inhalation Daily   levothyroxine  50 mcg Oral Q0600   pantoprazole  40 mg Oral BID   rosuvastatin  20 mg Oral Daily   Continuous Infusions:  sodium chloride     amiodarone 30 mg/hr (02/04/23 0315)   heparin 500 Units/hr (02/04/23 0018)   PRN Meds: acetaminophen **OR** acetaminophen, albuterol, ondansetron **OR** ondansetron (ZOFRAN) IV   Vital Signs    Vitals:   02/04/23 0530 02/04/23 0600 02/04/23 0630 02/04/23 0700  BP: 96/66 (!) 87/46 118/85 110/86  Pulse: 71 (!) 40    Resp: (!) 21 (!) 23 (!) 21 14  Temp:      TempSrc:      SpO2: 99% 98%    Weight:      Height:        Intake/Output Summary (Last 24 hours) at 02/04/2023 0754 Last data filed at 02/04/2023 0315 Gross per 24 hour  Intake 1937.96 ml  Output 200 ml  Net 1737.96 ml      02/03/2023    8:09 AM 02/02/2023    6:29 PM 01/27/2023    5:17 AM  Last 3 Weights  Weight (lbs) 131 lb 9.8 oz 132 lb 15 oz 133 lb  Weight (kg) 59.7 kg 60.3 kg 60.328 kg      Telemetry    Afib HR 100-120s - Personally Reviewed  ECG    No new - Personally Reviewed  Physical Exam   GEN: No acute distress.   Neck: No JVD Cardiac: Irreg IRreg, no murmurs, rubs, or gallops.  Respiratory: Clear to auscultation bilaterally. GI: Soft, nontender, non-distended  MS: No edema; No deformity. Neuro:  Nonfocal  Psych: Normal affect   Labs    High Sensitivity Troponin:   Recent Labs  Lab 01/20/23 1449 01/20/23 1632 02/02/23 1910  02/02/23 2105  TROPONINIHS 33* 26* 37* 32*     Chemistry Recent Labs  Lab 02/02/23 1910 02/03/23 0540 02/04/23 0500  NA 135 136 137  K 4.2 3.7 4.1  CL 108 109 112*  CO2 16* 17* 17*  GLUCOSE 190* 104* 113*  BUN 45* 41* 45*  CREATININE 1.83* 1.69* 1.59*  CALCIUM 9.0 9.0 8.8*  MG  --  2.3 2.2  PROT  --  5.6*  --   ALBUMIN  --  2.9*  --   AST  --  33  --   ALT  --  27  --   ALKPHOS  --  63  --   BILITOT  --  0.4  --   GFRNONAA 28* 31* 33*  ANIONGAP 11 10 8     Lipids No results for input(s): "CHOL", "TRIG", "HDL", "LABVLDL", "LDLCALC", "CHOLHDL" in the last 168 hours.  Hematology Recent Labs  Lab 02/02/23 1955 02/03/23 0540 02/04/23 0500  WBC 9.4 6.9 7.8  RBC 3.53* 3.43* 3.27*  HGB 10.0* 9.8* 9.4*  HCT 33.9* 33.1* 31.7*  MCV 96.0 96.5 96.9  MCH 28.3 28.6 28.7  MCHC 29.5* 29.6* 29.7*  RDW 17.8* 17.8* 18.0*  PLT  179 146* 138*   Thyroid  Recent Labs  Lab 02/03/23 0540  TSH 2.628    BNP Recent Labs  Lab 02/02/23 1955  BNP 442.0*    DDimer No results for input(s): "DDIMER" in the last 168 hours.   Radiology    DG Chest Portable 1 View  Result Date: 02/02/2023 CLINICAL DATA:  Shortness of breath EXAM: PORTABLE CHEST 1 VIEW COMPARISON:  01/20/2023, chest CT 09/12/2022 FINDINGS: Emphysema and bronchitic changes. Minimal atelectasis or scarring at the bases. No consolidation, pleural effusion or pneumothorax. Normal cardiac size with aortic atherosclerosis. IMPRESSION: No active disease. Emphysema and bronchitic changes. Electronically Signed   By: Jasmine Pang M.D.   On: 02/02/2023 20:26    Cardiac Studies   Echo 09/2022 1. Left ventricular ejection fraction, by estimation, is 60 to 65%. The  left ventricle has normal function. The left ventricle has no regional  wall motion abnormalities. There is mild left ventricular hypertrophy.  Left ventricular diastolic parameters  are indeterminate. Elevated left atrial pressure.   2. Right ventricular systolic  function is normal. The right ventricular  size is normal.   3. The mitral valve is abnormal. Mild mitral valve regurgitation. No  evidence of mitral stenosis.   4. The aortic valve is tricuspid. There is mild calcification of the  aortic valve. There is mild thickening of the aortic valve. Aortic valve  regurgitation is not visualized. No aortic stenosis is present.   5. The inferior vena cava is normal in size with greater than 50%  respiratory variability, suggesting right atrial pressure of 3 mmHg.   Patient Profile     82 y.o. female  with a hx of CAD status post DES to left circumflex in 2017, paroxysmal atrial flutter, history of GI bleed status post transfusions, COPD, breast cancer, Raynaud's syndrome, hypertension, carotid artery stenosis, hyperlipidemia who is being seen 02/03/2023 for the evaluation of paroxysmal A-fib   Assessment & Plan    Paroxysmal Afib - presented with shortness of breath found to be in rapid A-fib started on IV Dilt, patient developed hypotension and was transition to IV amiodarone - patient remains in Afib with rates 100-120s -PTA Eliquis held>> IV heparin in the interim  -Keep mag greater than 2 and K greater than 4 - continue IV amiodarone - plan for TEE/DCCV today. Can likely switch over oral Eliquis.    Elevated troponin CAD s/p DES to Lcx 2017 - minimally elevated troponin with flat trend not consistent with ACS - patient denies chest pain - suspect demand ischemia>no plan for ischemic work-up -No aspirin with Eliquis -Continue Crestor   Elevated BNP - BNP 442 and CXR clear -Echo in June 2024 showed normal LVEF, mild LVH, indeterminate diastolic parameters, mild MR. - patient does not appear volume up on exam - kidney function stable this AM   AKI on CKD stage IV - scr 1.83, BUN 45 on admission - kidney function stable   HTN - low overnight  For questions or updates, please contact [facility] Please consult  [email or web address] for contact info under        Signed, Ayaan Shutes David Stall, PA-C  02/04/2023, 7:54 AM

## 2023-02-04 NOTE — Progress Notes (Signed)
*  PRELIMINARY RESULTS* Echocardiogram Echocardiogram Transesophageal has been performed.  Stacey Drain 02/04/2023, 11:29 AM

## 2023-02-04 NOTE — Anesthesia Preprocedure Evaluation (Signed)
Anesthesia Evaluation  Patient identified by MRN, date of birth, ID band Patient awake    Reviewed: Allergy & Precautions, H&P , NPO status , Patient's Chart, lab work & pertinent test results, reviewed documented beta blocker date and time   Airway Mallampati: II  TM Distance: >3 FB Neck ROM: full    Dental no notable dental hx. (+) Missing   Pulmonary neg pulmonary ROS, pneumonia, COPD, former smoker   Pulmonary exam normal breath sounds clear to auscultation       Cardiovascular Exercise Tolerance: Good hypertension, + CAD, + Past MI and + DOE  negative cardio ROS + dysrhythmias Atrial Fibrillation  Rhythm:regular Rate:Normal     Neuro/Psych  PSYCHIATRIC DISORDERS  Depression    negative neurological ROS  negative psych ROS   GI/Hepatic Neg liver ROS,GERD  Medicated,,  Endo/Other  negative endocrine ROSHypothyroidism    Renal/GU Renal diseasenegative Renal ROS  negative genitourinary   Musculoskeletal   Abdominal   Peds  Hematology negative hematology ROS (+) Blood dyscrasia, anemia   Anesthesia Other Findings   Reproductive/Obstetrics negative OB ROS                              Anesthesia Physical Anesthesia Plan  ASA: 4 and emergent  Anesthesia Plan: General   Post-op Pain Management:    Induction:   PONV Risk Score and Plan: Propofol infusion  Airway Management Planned:   Additional Equipment:   Intra-op Plan:   Post-operative Plan:   Informed Consent: I have reviewed the patients History and Physical, chart, labs and discussed the procedure including the risks, benefits and alternatives for the proposed anesthesia with the patient or authorized representative who has indicated his/her understanding and acceptance.     Dental Advisory Given  Plan Discussed with: CRNA  Anesthesia Plan Comments:         Anesthesia Quick Evaluation

## 2023-02-04 NOTE — Interval H&P Note (Signed)
History and Physical Interval Note:  02/04/2023 10:29 AM  Patricia Jackson  has presented today for surgery, with the diagnosis of a-fib/flutter with RVR.  The various methods of treatment have been discussed with the patient and family. After consideration of risks, benefits and other options for treatment, the patient has consented to  Procedure(s): CARDIOVERSION (N/A) TRANSESOPHAGEAL ECHOCARDIOGRAM (TEE) (N/A) as a surgical intervention.  The patient's history has been reviewed, patient examined, no change in status, stable for surgery.  I have reviewed the patient's chart and labs.  Questions were answered to the patient's satisfaction.     Galilea Quito P Ayah Cozzolino

## 2023-02-04 NOTE — Progress Notes (Signed)
Electrical Cardioversion Procedure Note Sabel Hornbeck [id number] 08/10/1940  Procedure: Electrical Cardioversion Indications:  Atrial Fibrillation  Procedure Details Consent: Risks of procedure as well as the alternatives and risks of each were explained to the (patient/caregiver).  Consent for procedure obtained. Time Out: Verified patient identification, verified procedure, site/side was marked, verified correct patient position, special equipment/implants available, medications/allergies/relevent history reviewed, required imaging and test results available.  Performed at 1053 02/04/2023  Patient placed on cardiac monitor, pulse oximetry, supplemental oxygen as necessary.  Sedation given:  Profolol Pacer pads placed anterior and posterior chest. Dr.Mallipeddi confirmed chest and back pad placement   Cardioverted 1 time(s).  Cardioverted at 120J.  Evaluation Findings: Post procedure EKG shows: NSR Complications: None Patient did tolerate procedure well.   Sherren Kerns 02/04/2023, 11:54 AM

## 2023-02-05 DIAGNOSIS — I480 Paroxysmal atrial fibrillation: Secondary | ICD-10-CM | POA: Diagnosis not present

## 2023-02-05 LAB — BASIC METABOLIC PANEL
Anion gap: 6 (ref 5–15)
BUN: 44 mg/dL — ABNORMAL HIGH (ref 8–23)
CO2: 19 mmol/L — ABNORMAL LOW (ref 22–32)
Calcium: 8.8 mg/dL — ABNORMAL LOW (ref 8.9–10.3)
Chloride: 112 mmol/L — ABNORMAL HIGH (ref 98–111)
Creatinine, Ser: 1.75 mg/dL — ABNORMAL HIGH (ref 0.44–1.00)
GFR, Estimated: 29 mL/min — ABNORMAL LOW (ref >60)
Glucose, Bld: 100 mg/dL — ABNORMAL HIGH (ref 70–99)
Potassium: 4.3 mmol/L (ref 3.5–5.1)
Sodium: 137 mmol/L (ref 135–145)

## 2023-02-05 LAB — CBC
HCT: 29.1 % — ABNORMAL LOW (ref 36.0–46.0)
Hemoglobin: 8.7 g/dL — ABNORMAL LOW (ref 12.0–15.0)
MCH: 28.6 pg (ref 26.0–34.0)
MCHC: 29.9 g/dL — ABNORMAL LOW (ref 30.0–36.0)
MCV: 95.7 fL (ref 80.0–100.0)
Platelets: 113 10*3/uL — ABNORMAL LOW (ref 150–400)
RBC: 3.04 MIL/uL — ABNORMAL LOW (ref 3.87–5.11)
RDW: 18 % — ABNORMAL HIGH (ref 11.5–15.5)
WBC: 6.7 10*3/uL (ref 4.0–10.5)
nRBC: 0 % (ref 0.0–0.2)

## 2023-02-05 LAB — MAGNESIUM: Magnesium: 2 mg/dL (ref 1.7–2.4)

## 2023-02-05 MED ORDER — LIDOCAINE 5 % EX PTCH: 1.0000 | MEDICATED_PATCH | CUTANEOUS | 0 refills | Status: DC

## 2023-02-05 MED ORDER — APIXABAN 2.5 MG PO TABS: 2.5000 mg | ORAL_TABLET | Freq: Two times a day (BID) | ORAL | 0 refills | Status: DC

## 2023-02-05 MED ORDER — AMIODARONE HCL 200 MG PO TABS: 200.0000 mg | ORAL_TABLET | Freq: Every day | ORAL | 0 refills | Status: DC

## 2023-02-05 MED ORDER — DILTIAZEM HCL ER COATED BEADS 120 MG PO CP24: 120.0000 mg | ORAL_CAPSULE | Freq: Every day | ORAL | 0 refills | Status: DC | PRN

## 2023-02-05 MED ORDER — AMIODARONE HCL 200 MG PO TABS: 200.0000 mg | ORAL_TABLET | Freq: Two times a day (BID) | ORAL | 0 refills | Status: DC

## 2023-02-05 NOTE — Progress Notes (Signed)
Patient given discharge instructions and verbalized understanding. All peripheral IVs removed. Patient aware of prescriptions sent to pharmacy. Grandson at main entrance to pick up patient to take her home.

## 2023-02-05 NOTE — Discharge Summary (Signed)
Physician Discharge Summary  Patricia Jackson QMV:[id number] DOB: [birth year] DOA: 02/02/2023  PCP: Benita Stabile, MD  Admit date: 02/02/2023  Discharge date: 02/05/2023  Admitted From:Home  Disposition:  Home  Recommendations for Outpatient Follow-up:  Follow up with PCP in 1-2 weeks Follow-up with cardiology Dr. Wyline Mood outpatient Remain on amiodarone 200 mg twice daily for 3 weeks and then daily thereafter Cardizem prescribed as needed for heart rate greater than 60 per cardiology recommendations Continue Eliquis twice daily Continue other home medications as prior  Home Health: None  Equipment/Devices: None  Discharge Condition:Stable  CODE STATUS: Full  Diet recommendation: Heart Healthy  Brief/Interim Summary: Patricia Jackson is a 82 y.o. female with medical history significant of hypertension, COPD, chronic kidney disease, GI bleed, pulmonary fibrosis due to shortness of breath since she woke up this morning, niece at bedside states that she could barely walk 10 feet from her bedroom to the bathroom without being short of breath, she also complained of minimal chest pressure without radiation.   Patient was recently admitted from 10/17 to 10/24 due to paroxysmal atrial fibrillation with RVR requiring DCCV on 10/18 and was asked to continue with diltiazem 120 mg daily and Eliquis (patient states she has been compliant with these).  She also presented with acute hypoxic respiratory failure that was initially due to pulmonary edema and eventually due to acute exacerbation of COPD (treated with doxycycline and steroids as well as bronchodilators), PAF and atelectasis.  She has been readmitted with paroxysmal atrial fibrillation with RVR and was started on IV amiodarone and had to undergo repeat TEE/DCCV on 11/1.  She converted to sinus rhythm successfully, but had some hypotension that needed to be monitored overnight and has now resolved.  She is in stable condition for discharge on  oral medications as noted below and will need close follow-up with cardiology with referral sent.  No other acute events or concerns noted.  Discharge Diagnoses:  Principal Problem:   Paroxysmal atrial fibrillation with RVR (HCC) Active Problems:   Elevated troponin   Essential hypertension   CAD in native artery   Acute kidney injury superimposed on stage 4 chronic kidney disease (HCC)   Hyperglycemia   Chronic obstructive pulmonary disease (HCC)   Acquired hypothyroidism   Atrial fibrillation with RVR (HCC)   Elevated brain natriuretic peptide (BNP) level  Principal discharge diagnosis: Recurrent paroxysmal atrial fibrillation with RVR status post DCCV 11/1.  Discharge Instructions  Discharge Instructions     Ambulatory referral to Cardiology   Complete by: As directed    Diet - low sodium heart healthy   Complete by: As directed    Increase activity slowly   Complete by: As directed       Allergies as of 02/05/2023       Reactions   Povidone-iodine Other (See Comments)   Burning   Sulfa Antibiotics Other (See Comments)   Unknown reaction    Sulfamethoxazole Other (See Comments)   unknown        Medication List     TAKE these medications    acetaminophen 500 MG tablet Commonly known as: TYLENOL Take 1,000 mg by mouth every 8 (eight) hours as needed for mild pain or moderate pain.   albuterol (2.5 MG/3ML) 0.083% nebulizer solution Commonly known as: PROVENTIL Take 2.5 mg by nebulization every 6 (six) hours as needed for wheezing or shortness of breath.   amiodarone 200 MG tablet Commonly known as: PACERONE Take 1 tablet (200 mg total) by mouth  2 (two) times daily for 21 days.   amiodarone 200 MG tablet Commonly known as: Pacerone Take 1 tablet (200 mg total) by mouth daily. Start taking on: February 27, 2023   apixaban 2.5 MG Tabs tablet Commonly known as: ELIQUIS Take 1 tablet (2.5 mg total) by mouth 2 (two) times daily. What changed:  medication  strength how much to take   atropine 1 % ophthalmic solution Place 1 drop into both eyes 3 (three) times daily.   buPROPion 150 MG 24 hr tablet Commonly known as: WELLBUTRIN XL Take 150 mg by mouth daily.   COPPER CAPS PO Take 6 mg by mouth daily.   denosumab 60 MG/ML Sosy injection Commonly known as: PROLIA Inject 60 mg into the skin every 6 (six) months.   diltiazem 120 MG 24 hr capsule Commonly known as: CARDIZEM CD Take 1 capsule (120 mg total) by mouth daily as needed (For HR>60 bpm). What changed:  when to take this reasons to take this   ferrous sulfate 325 (65 FE) MG EC tablet Take 1 tablet (325 mg total) by mouth daily with breakfast.   levothyroxine 50 MCG tablet Commonly known as: SYNTHROID Take 50 mcg by mouth daily before breakfast.   lidocaine 5 % Commonly known as: LIDODERM Place 1 patch onto the skin daily. Remove & Discard patch within 12 hours or as directed by MD Start taking on: February 06, 2023   nitroGLYCERIN 0.4 MG SL tablet Commonly known as: NITROSTAT Place 0.4 mg under the tongue every 5 (five) minutes as needed for chest pain.   pantoprazole 40 MG tablet Commonly known as: Protonix Take 1 tablet (40 mg total) by mouth 2 (two) times daily.   prednisoLONE acetate 1 % ophthalmic suspension Commonly known as: PRED FORTE Place 1 drop into the right eye in the morning and at bedtime.   rosuvastatin 20 MG tablet Commonly known as: CRESTOR Take 20 mg by mouth daily.   sertraline 25 MG tablet Commonly known as: ZOLOFT Take 25 mg by mouth daily.   Trelegy Ellipta 200-62.5-25 MCG/ACT Aepb Generic drug: Fluticasone-Umeclidin-Vilant Inhale 1 puff into the lungs daily.   Vitamin D3 50 MCG (2000 UT) Tabs Take 2,000 Units by mouth daily.        Follow-up Information     Benita Stabile, MD. Schedule an appointment as soon as possible for a visit in 2 week(s).   Specialty: Internal Medicine Contact information: [address] [place] [address] [phone]         Antoine Poche, MD. Go to.   Specialty: Cardiology Contact information: [address] [place] [address] [phone]                Allergies  Allergen Reactions   Povidone-Iodine Other (See Comments)    Burning   Sulfa Antibiotics Other (See Comments)    Unknown reaction    Sulfamethoxazole Other (See Comments)    unknown    Consultations: Cardiology   Procedures/Studies: ECHO TEE  Result Date: 02/04/2023    TRANSESOPHOGEAL ECHO REPORT   Patient Name:   MACHAELA CATERINO Date of Exam: 02/04/2023 Medical Rec #:  [id number]       Height:       67.0 in Accession #:    [id number]      Weight:       131.6 lb Date of Birth:  1940-04-23      BSA:  1.693 m Patient Age:    81 years        BP:           125/84 mmHg Patient Gender: F               HR:           134 bpm. [facility] Procedure: Transesophageal Echo, Cardiac Doppler and Color Doppler Indications:    TEE Cardioversion- Atrial Fibrillation  History:        Patient has prior history of Echocardiogram examinations, most                 recent 09/13/2022. CAD and Previous Myocardial Infarction, COPD,                 Arrythmias:Atrial Fibrillation; Risk Factors:Hypertension and                 Dyslipidemia.  Sonographer:    Celesta Gentile RCS Referring Phys: [id number] CADENCE H FURTH PROCEDURE: The transesophogeal probe was passed without difficulty through the esophogus of the patient. Imaged were obtained with the patient in a left lateral decubitus position. Local oropharyngeal anesthetic was provided with Cetacaine. Sedation performed by different physician. The patient was monitored while under deep sedation. Image quality was excellent. Supplementary images were obtained from transthoracic windows as indicated to answer the clinical question. The patient developed no complications during the procedure. A successful direct current cardioversion was  performed at 120 joules with 1 attempt. Patient became briefly hypotensive during the procedure 60 mm Hg SBP with MAP 41 mm Hg on one pressor. LAA images in two views are obtained and procedure had to be aborted due to persistent low BP 60 mm Hg SBP. Once the TEE probe was removed from the esophagus, BP increased from 60 to 90 mm Hg SBP. Transgastric views not obtained: patient did not tolerate.  IMPRESSIONS  1. Left ventricular ejection fraction, by estimation, is 55 to 60%. The left ventricle has normal function. The left ventricle has no regional wall motion abnormalities. Left ventricular diastolic function could not be evaluated.  2. Right ventricular systolic function is low normal. The right ventricular size is normal. Tricuspid regurgitation signal is inadequate for assessing PA pressure.  3. No left atrial/left atrial appendage thrombus was detected. The LAA emptying velocity was 70 cm/s.  4. The mitral valve is normal in structure. Mild mitral valve regurgitation. No evidence of mitral stenosis.  5. The aortic valve is tricuspid. Aortic valve regurgitation is not visualized. No aortic stenosis is present. Conclusion(s)/Recommendation(s): No LA/LAA thrombus identified. Successful cardioversion performed with restoration of normal sinus rhythm. FINDINGS  Left Ventricle: Left ventricular ejection fraction, by estimation, is 55 to 60%. The left ventricle has normal function. The left ventricle has no regional wall motion abnormalities. The left ventricular internal cavity size was normal in size. There is  no left ventricular hypertrophy. Left ventricular diastolic function could not be evaluated. Right Ventricle: The right ventricular size is normal. No increase in right ventricular wall thickness. Right ventricular systolic function is low normal. Tricuspid regurgitation signal is inadequate for assessing PA pressure. Left Atrium: Left atrial size was not assessed. No left atrial/left atrial appendage  thrombus was detected. The LAA emptying velocity was 70 cm/s. Right Atrium: Right atrial size was not assessed. Prominent Chiari network. Pericardium: There is no evidence of pericardial effusion. Mitral Valve: The mitral valve is normal in structure. Mild mitral valve regurgitation. No evidence of mitral valve  stenosis. Tricuspid Valve: The tricuspid valve is normal in structure. Tricuspid valve regurgitation is trivial. No evidence of tricuspid stenosis. Aortic Valve: The aortic valve is tricuspid. Aortic valve regurgitation is not visualized. No aortic stenosis is present. Pulmonic Valve: The pulmonic valve was normal in structure. Pulmonic valve regurgitation is trivial. No evidence of pulmonic stenosis. Aorta: Aortic root could not be assessed. Venous: The inferior vena cava was not well visualized. IAS/Shunts: The interatrial septum was not assessed. Additional Comments: Spectral Doppler performed. Vishnu Priya Mallipeddi Electronically signed by Winfield Rast Mallipeddi Signature Date/Time: 02/04/2023/1:37:33 PM    Final    DG Chest Portable 1 View  Result Date: 02/02/2023 CLINICAL DATA:  Shortness of breath EXAM: PORTABLE CHEST 1 VIEW COMPARISON:  01/20/2023, chest CT 09/12/2022 FINDINGS: Emphysema and bronchitic changes. Minimal atelectasis or scarring at the bases. No consolidation, pleural effusion or pneumothorax. Normal cardiac size with aortic atherosclerosis. IMPRESSION: No active disease. Emphysema and bronchitic changes. Electronically Signed   By: Jasmine Pang M.D.   On: 02/02/2023 20:26   US RENAL  Result Date: 01/25/2023 CLINICAL DATA:  Chronic kidney disease EXAM: RENAL / URINARY TRACT ULTRASOUND COMPLETE COMPARISON:  None Available. FINDINGS: Right Kidney: Renal measurements: 9.1 x 4.1 x 3.8 cm = volume: 74 mL. Echogenicity within normal limits. No focal mass. Mild caliectasis. Left Kidney: Renal measurements: 9.7 x 4.4 x 4.3 cm = volume: 95 mL. Echogenicity within normal limits. No  hydronephrosis. There is a 1.2 x 0.9 x 1.3 cm cyst in the left kidney. Bladder: Appears normal for degree of bladder distention. Other: None. IMPRESSION: 1. Mild caliectasis on the right. 2. 1.3 cm cyst in the left kidney. Electronically Signed   By: Darliss Cheney M.D.   On: 01/25/2023 18:09   DG Chest Portable 1 View  Result Date: 01/20/2023 CLINICAL DATA:  sob, tachycardia EXAM: PORTABLE CHEST 1 VIEW COMPARISON:  01/18/2023 FINDINGS: Bilateral lungs appear hyperexpanded and hyperlucent with coarse bronchovascular markings, in keeping with COPD. There are increased interstitial markings when compared to the prior exam, suggesting underlying mild pulmonary edema. There are probable atelectasis/scarring at the lung bases. Bilateral lungs otherwise appear clear. No acute consolidation or lung collapse. Bilateral costophrenic angles are clear. Stable cardio-mediastinal silhouette. No acute osseous abnormalities. The soft tissues are within normal limits. IMPRESSION: 1. COPD. 2. Suspected mild superimposed pulmonary edema. Electronically Signed   By: Jules Schick M.D.   On: 01/20/2023 16:27   DG Chest Port 1 View  Result Date: 01/18/2023 CLINICAL DATA:  Sepsis cough EXAM: PORTABLE CHEST 1 VIEW COMPARISON:  11/20/2022 FINDINGS: Emphysema and bronchitic changes. No acute airspace disease or effusion. Stable cardiomediastinal silhouette with aortic atherosclerosis. No pneumothorax IMPRESSION: Emphysema and bronchitic changes. Electronically Signed   By: Jasmine Pang M.D.   On: 01/18/2023 15:21     Discharge Exam: Vitals:   02/05/23 0900 02/05/23 0930  BP: (!) 128/59 (!) 132/57  Pulse: 81 82  Resp: (!) 25 (!) 23  Temp:    SpO2: 100% 100%   Vitals:   02/05/23 0800 02/05/23 0830 02/05/23 0900 02/05/23 0930  BP: (!) 131/51 (!) 118/57 (!) 128/59 (!) 132/57  Pulse: 89  81 82  Resp: (!) 26 (!) 24 (!) 25 (!) 23  Temp:      TempSrc:      SpO2: 95%  100% 100%  Weight:      Height:        General:  Pt is alert, awake, not in acute distress Cardiovascular: RRR, S1/S2 +,  no rubs, no gallops Respiratory: CTA bilaterally, no wheezing, no rhonchi Abdominal: Soft, NT, ND, bowel sounds + Extremities: no edema, no cyanosis    The results of significant diagnostics from this hospitalization (including imaging, microbiology, ancillary and laboratory) are listed below for reference.     Microbiology: Recent Results (from the past 240 hour(s))  SARS Coronavirus 2 by RT PCR (hospital order, performed in [facility] hospital lab) *cepheid single result test* Anterior Nasal Swab     Status: None   Collection Time: 02/02/23  7:31 PM   Specimen: Anterior Nasal Swab  Result Value Ref Range Status   SARS Coronavirus 2 by RT PCR NEGATIVE NEGATIVE Final    Comment: (NOTE) SARS-CoV-2 target nucleic acids are NOT DETECTED.  The SARS-CoV-2 RNA is generally detectable in upper and lower respiratory specimens during the acute phase of infection. The lowest concentration of SARS-CoV-2 viral copies this assay can detect is 250 copies / mL. A negative result does not preclude SARS-CoV-2 infection and should not be used as the sole basis for treatment or other patient management decisions.  A negative result may occur with improper specimen collection / handling, submission of specimen other than nasopharyngeal swab, presence of viral mutation(s) within the areas targeted by this assay, and inadequate number of viral copies (<250 copies / mL). A negative result must be combined with clinical observations, patient history, and epidemiological information.  Fact Sheet for Patients:   RoadLapTop.co.za  Fact Sheet for Healthcare Providers: http://kim-miller.com/  This test is not yet approved or  cleared by the [place] FDA and has been authorized for detection and/or diagnosis of SARS-CoV-2 by FDA under an Emergency Use Authorization (EUA).  This EUA  will remain in effect (meaning this test can be used) for the duration of the COVID-19 declaration under Section 564(b)(1) of the Act, 21 U.S.C. section 360bbb-3(b)(1), unless the authorization is terminated or revoked sooner.  Performed at [facility], [address] [place], [place] [address]   MRSA Next Gen by PCR, Nasal     Status: None   Collection Time: 02/03/23  8:00 AM   Specimen: Nasal Mucosa; Nasal Swab  Result Value Ref Range Status   MRSA by PCR Next Gen NOT DETECTED NOT DETECTED Final    Comment: (NOTE) The GeneXpert MRSA Assay (FDA approved for NASAL specimens only), is one component of a comprehensive MRSA colonization surveillance program. It is not intended to diagnose MRSA infection nor to guide or monitor treatment for MRSA infections. Test performance is not FDA approved in patients less than 53 years old. Performed at [facility], [address], [place], [place] [address]      Labs: BNP (last 3 results) Recent Labs    09/12/22 1727 01/20/23 1449 02/02/23 1955  BNP 190.0* 905.0* 442.0*   Basic Metabolic Panel: Recent Labs  Lab 02/02/23 1910 02/03/23 0540 02/04/23 0500 02/05/23 0541  NA 135 136 137 137  K 4.2 3.7 4.1 4.3  CL 108 109 112* 112*  CO2 16* 17* 17* 19*  GLUCOSE 190* 104* 113* 100*  BUN 45* 41* 45* 44*  CREATININE 1.83* 1.69* 1.59* 1.75*  CALCIUM 9.0 9.0 8.8* 8.8*  MG  --  2.3 2.2 2.0  PHOS  --  3.7  --   --    Liver Function Tests: Recent Labs  Lab 02/03/23 0540  AST 33  ALT 27  ALKPHOS 63  BILITOT 0.4  PROT 5.6*  ALBUMIN 2.9*   No results for input(s): "LIPASE", "  AMYLASE" in the last 168 hours. No results for input(s): "AMMONIA" in the last 168 hours. CBC: Recent Labs  Lab 02/02/23 1955 02/03/23 0540 02/04/23 0500 02/05/23 0541  WBC 9.4 6.9 7.8 6.7  HGB 10.0* 9.8* 9.4* 8.7*  HCT 33.9* 33.1* 31.7* 29.1*  MCV 96.0 96.5 96.9 95.7  PLT 179 146* 138* 113*   Cardiac Enzymes: No results for input(s): "CKTOTAL",  "CKMB", "CKMBINDEX", "TROPONINI" in the last 168 hours. BNP: Invalid input(s): "POCBNP" CBG: No results for input(s): "GLUCAP" in the last 168 hours. D-Dimer No results for input(s): "DDIMER" in the last 72 hours. Hgb A1c Recent Labs    02/03/23 0540  HGBA1C 6.0*   Lipid Profile No results for input(s): "CHOL", "HDL", "LDLCALC", "TRIG", "CHOLHDL", "LDLDIRECT" in the last 72 hours. Thyroid function studies Recent Labs    02/03/23 0540  TSH 2.628   Anemia work up No results for input(s): "VITAMINB12", "FOLATE", "FERRITIN", "TIBC", "IRON", "RETICCTPCT" in the last 72 hours. Urinalysis    Component Value Date/Time   COLORURINE YELLOW 02/03/2023 0123   APPEARANCEUR CLEAR 02/03/2023 0123   LABSPEC 1.016 02/03/2023 0123   PHURINE 5.0 02/03/2023 0123   GLUCOSEU NEGATIVE 02/03/2023 0123   HGBUR NEGATIVE 02/03/2023 0123   BILIRUBINUR NEGATIVE 02/03/2023 0123   KETONESUR NEGATIVE 02/03/2023 0123   PROTEINUR NEGATIVE 02/03/2023 0123   NITRITE NEGATIVE 02/03/2023 0123   LEUKOCYTESUR MODERATE (A) 02/03/2023 0123   Sepsis Labs Recent Labs  Lab 02/02/23 1955 02/03/23 0540 02/04/23 0500 02/05/23 0541  WBC 9.4 6.9 7.8 6.7   Microbiology Recent Results (from the past 240 hour(s))  SARS Coronavirus 2 by RT PCR (hospital order, performed in [facility] hospital lab) *cepheid single result test* Anterior Nasal Swab     Status: None   Collection Time: 02/02/23  7:31 PM   Specimen: Anterior Nasal Swab  Result Value Ref Range Status   SARS Coronavirus 2 by RT PCR NEGATIVE NEGATIVE Final    Comment: (NOTE) SARS-CoV-2 target nucleic acids are NOT DETECTED.  The SARS-CoV-2 RNA is generally detectable in upper and lower respiratory specimens during the acute phase of infection. The lowest concentration of SARS-CoV-2 viral copies this assay can detect is 250 copies / mL. A negative result does not preclude SARS-CoV-2 infection and should not be used as the sole basis for treatment or  other patient management decisions.  A negative result may occur with improper specimen collection / handling, submission of specimen other than nasopharyngeal swab, presence of viral mutation(s) within the areas targeted by this assay, and inadequate number of viral copies (<250 copies / mL). A negative result must be combined with clinical observations, patient history, and epidemiological information.  Fact Sheet for Patients:   RoadLapTop.co.za  Fact Sheet for Healthcare Providers: http://kim-miller.com/  This test is not yet approved or  cleared by the [place] FDA and has been authorized for detection and/or diagnosis of SARS-CoV-2 by FDA under an Emergency Use Authorization (EUA).  This EUA will remain in effect (meaning this test can be used) for the duration of the COVID-19 declaration under Section 564(b)(1) of the Act, 21 U.S.C. section 360bbb-3(b)(1), unless the authorization is terminated or revoked sooner.  Performed at [facility], [address], [place], [place] [address]   MRSA Next Gen by PCR, Nasal     Status: None   Collection Time: 02/03/23  8:00 AM   Specimen: Nasal Mucosa; Nasal Swab  Result Value Ref Range Status   MRSA by PCR Next Gen NOT DETECTED  NOT DETECTED Final    Comment: (NOTE) The GeneXpert MRSA Assay (FDA approved for NASAL specimens only), is one component of a comprehensive MRSA colonization surveillance program. It is not intended to diagnose MRSA infection nor to guide or monitor treatment for MRSA infections. Test performance is not FDA approved in patients less than 86 years old. Performed at [facility], [address] [place], [place] [address]      Time coordinating discharge: 35 minutes  SIGNED:   Erick Blinks, DO [facility] 02/05/2023, 10:00 AM  If 7PM-7AM, please contact night-coverage [email or web address]

## 2023-02-05 NOTE — Plan of Care (Signed)

## 2023-02-07 ENCOUNTER — Encounter (HOSPITAL_COMMUNITY): Payer: Self-pay | Admitting: Internal Medicine

## 2023-02-09 ENCOUNTER — Ambulatory Visit: Payer: Self-pay | Admitting: *Deleted

## 2023-02-09 NOTE — Patient Instructions (Signed)
Visit Information  Thank you for taking time to visit with me today. Please don't hesitate to contact me if I can be of assistance to you.   Following are the goals we discussed today:   Goals Addressed             This Visit's Progress    Manage weight, hearing & mobility needs -RN CM  care coordination   On track    Patient will make an appointment with an in network hearing provider/office with the assist of [email or web address] Patient will obtain a smaller walker Resolved 02/09/23   Interventions Today    Flowsheet Row Most Recent Value  Chronic Disease   Chronic disease during today's visit Other, Atrial Fibrillation (AFib)  [receive hre smaller walker and very beneficial Mised right appointment - was hospitalized discharged on 02/05/23]  General Interventions   General Interventions Discussed/Reviewed General Interventions Reviewed, Doctor Visits, Durable Medical Equipment (DME)  Doctor Visits Discussed/Reviewed Doctor Visits Reviewed, PCP  Luberta Robertson dr hall on 02/10/23]  Durable Medical Equipment (DME) Dan Humphreys, BP Cuff  [has a wrist BP cuff]  PCP/Specialist Visits Compliance with follow-up visit  Communication with PCP/Specialists  [Called with her to [employer], [place] _does not accept [employer] nor [employer].  Completed online [facility]- [email or web address]) request order/referral for new hearing aid. She should receive outreach for an appointment]  Exercise Interventions   Exercise Discussed/Reviewed Exercise Reviewed, Physical Activity  [now walking and doing activites of daily living better since last cardiac conversion]  Physical Activity Discussed/Reviewed Physical Activity Reviewed  Education Interventions   Education Provided --  [hearing aid coverage, beltone, uhc hearing program, offered services of DSS blind coordinator for equipment, pulse monitoring fo ratrial fibrillation]  Provided Verbal Education On [employer], [employer], Mental Health/Coping with  Illness, Other  [uhc coverage fro hearing aid local provider-]  Mental Health Interventions   Mental Health Discussed/Reviewed Mental Health Reviewed, Coping Strategies  Pharmacy Interventions   Pharmacy Dicussed/Reviewed Pharmacy Topics Reviewed, Affording Medications  Safety Interventions   Safety Discussed/Reviewed Safety Reviewed, Home Safety  Home Safety Assistive Devices              Our next appointment is by telephone on 02/18/23  at 2:30 pm  Please call the care guide team at [phone] if you need to cancel or reschedule your appointment.   If you are experiencing a Mental Health or Behavioral Health Crisis or need someone to talk to, please call the Suicide and Crisis Lifeline: 988 call the [place] National Suicide Prevention Lifeline: (640)458-8016 or TTY: 431-161-8668 TTY 484-305-8946) to talk to a trained counselor call 1-800-273-TALK (toll free, 24 hour hotline) call the [facility]: 952-706-2092 call 911   Patient verbalizes understanding of instructions and care plan provided today and agrees to view in MyChart. Active MyChart status and patient understanding of how to access instructions and care plan via MyChart confirmed with patient.     The patient has been provided with contact information for the care management team and has been advised to call with any health related questions or concerns.   Gennett Garcia L. Noelle Penner, RN, BSN, [facility]  VBCI Care Management Coordinator  [phone]  Fax: [phone]

## 2023-02-09 NOTE — Patient Outreach (Signed)
  Care Coordination   Follow Up Visit Note   02/09/2023 Name: Patricia Jackson MRN: [id number] DOB: 1941-03-15  Patricia Jackson is a 82 y.o. year old female who sees [name], [name], MD for primary care. I spoke with  Festus Barren by phone today.  What matters to the patients health and wellness today?  Hearing aid walker, vision    Goals Addressed             This Visit's Progress    Manage weight, hearing & mobility needs -RN CM  care coordination   On track    Patient will make an appointment with an in network hearing provider/office with the assist of [email or web address] Patient will obtain a smaller walker Resolved 02/09/23   Interventions Today    Flowsheet Row Most Recent Value  Chronic Disease   Chronic disease during today's visit Other, Atrial Fibrillation (AFib)  [receive hre smaller walker and very beneficial Mised right appointment - was hospitalized discharged on 02/05/23]  General Interventions   General Interventions Discussed/Reviewed General Interventions Reviewed, Doctor Visits, Durable Medical Equipment (DME)  Doctor Visits Discussed/Reviewed Doctor Visits Reviewed, PCP  Luberta Robertson dr hall on 02/10/23]  Durable Medical Equipment (DME) Dan Humphreys, BP Cuff  [has a wrist BP cuff]  PCP/Specialist Visits Compliance with follow-up visit  Communication with PCP/Specialists  [Called with her to [place], [place] _does not accept [employer] nor [employer].  Completed online [facility]- [email or web address]) request order/referral for new hearing aid. She should receive outreach for an appointment]  Exercise Interventions   Exercise Discussed/Reviewed Exercise Reviewed, Physical Activity  [now walking and doing activites of daily living better since last cardiac conversion]  Physical Activity Discussed/Reviewed Physical Activity Reviewed  Education Interventions   Education Provided --  [hearing aid coverage, beltone, uhc hearing program, offered services of DSS blind coordinator for  equipment, pulse monitoring fo ratrial fibrillation]  Provided Verbal Education On [employer], [employer], Mental Health/Coping with Illness, Other  [uhc coverage fro hearing aid local provider-]  Mental Health Interventions   Mental Health Discussed/Reviewed Mental Health Reviewed, Coping Strategies  Pharmacy Interventions   Pharmacy Dicussed/Reviewed Pharmacy Topics Reviewed, Affording Medications  Safety Interventions   Safety Discussed/Reviewed Safety Reviewed, Home Safety  Home Safety Assistive Devices              SDOH assessments and interventions completed:  No     Care Coordination Interventions:  Yes, provided   Follow up plan: Follow up call scheduled for 02/18/23    Encounter Outcome:  Patient Visit Completed   Cala Bradford L. Noelle Penner, RN, BSN, [facility]  VBCI Care Management Coordinator  [phone]  Fax: [phone]

## 2023-02-10 DIAGNOSIS — D6869 Other thrombophilia: Secondary | ICD-10-CM | POA: Diagnosis not present

## 2023-02-10 DIAGNOSIS — I4891 Unspecified atrial fibrillation: Secondary | ICD-10-CM | POA: Diagnosis not present

## 2023-02-11 NOTE — Anesthesia Postprocedure Evaluation (Signed)
Anesthesia Post Note  Patient: Patricia Jackson  Procedure(s) Performed: CARDIOVERSION TRANSESOPHAGEAL ECHOCARDIOGRAM (TEE)  Patient location during evaluation: Phase II Anesthesia Type: General Level of consciousness: awake Pain management: pain level controlled Vital Signs Assessment: post-procedure vital signs reviewed and stable Respiratory status: spontaneous breathing and respiratory function stable Cardiovascular status: blood pressure returned to baseline and stable Postop Assessment: no headache and no apparent nausea or vomiting Anesthetic complications: no Comments: Late entry   No notable events documented.   Last Vitals:  Vitals:   02/05/23 0930 02/05/23 1000  BP: (!) 132/57   Pulse: 82 83  Resp: (!) 23 (!) 24  Temp:    SpO2: 100% 99%    Last Pain:  Vitals:   02/05/23 0830  TempSrc:   PainSc: 0-No pain                 [name]

## 2023-02-18 ENCOUNTER — Ambulatory Visit: Payer: Self-pay | Admitting: *Deleted

## 2023-02-18 NOTE — Patient Outreach (Signed)
  Care Coordination   02/18/2023 Name: Patricia Jackson MRN: [id number] DOB: 12-Apr-1940   Care Coordination Outreach Attempts:  An unsuccessful telephone outreach was attempted today to offer the patient information about available care coordination services.  Follow Up Plan:  Additional outreach attempts will be made to offer the patient care coordination information and services.   Encounter Outcome:  No Answer   Care Coordination Interventions:  No, not indicated     Keshonda Monsour L. Noelle Penner, RN, BSN, [facility]  VBCI Care Management Coordinator  [phone]  Fax: [phone]

## 2023-02-23 ENCOUNTER — Ambulatory Visit: Payer: 59 | Attending: Student | Admitting: Student

## 2023-02-23 ENCOUNTER — Encounter: Payer: Self-pay | Admitting: Student

## 2023-02-23 VITALS — BP 100/60 | HR 71 | Ht 67.0 in | Wt 130.6 lb

## 2023-02-23 DIAGNOSIS — I2510 Atherosclerotic heart disease of native coronary artery without angina pectoris: Secondary | ICD-10-CM

## 2023-02-23 DIAGNOSIS — J449 Chronic obstructive pulmonary disease, unspecified: Secondary | ICD-10-CM

## 2023-02-23 DIAGNOSIS — N1832 Chronic kidney disease, stage 3b: Secondary | ICD-10-CM | POA: Diagnosis not present

## 2023-02-23 DIAGNOSIS — I959 Hypotension, unspecified: Secondary | ICD-10-CM | POA: Diagnosis not present

## 2023-02-23 DIAGNOSIS — Z79899 Other long term (current) drug therapy: Secondary | ICD-10-CM

## 2023-02-23 DIAGNOSIS — I4892 Unspecified atrial flutter: Secondary | ICD-10-CM

## 2023-02-23 DIAGNOSIS — R0600 Dyspnea, unspecified: Secondary | ICD-10-CM

## 2023-02-23 DIAGNOSIS — I6523 Occlusion and stenosis of bilateral carotid arteries: Secondary | ICD-10-CM | POA: Diagnosis not present

## 2023-02-23 NOTE — Progress Notes (Unsigned)
Cardiology Office Note    Date:  02/23/2023  ID:  Patricia Jackson, DOB 09/20/1940, MRN [id number] Cardiologist: Dina Rich, MD    History of Present Illness:    Patricia Jackson is a 82 y.o. female with past medical history of CAD (s/p NSTEMI in 2017 with DES to LCx), paroxysmal atrial flutter (diagnosed in 11/2015 during admission for PNA), carotid artery stenosis, COPD, anemia and history of GIB who presents to the office today for hospital follow-up.   She was initially admitted to [facility] from 10/17 - 01/27/2023 for atrial flutter with RVR. She underwent cardioversion while in the ED due to being on anticoagulation and was transitioned to long-acting Cardizem CD 120 mg daily. Did have hypotension afterwards and required the use of Midodrine with plans to discontinue at outpatient follow-up. Was also treated for acute hypoxic respiratory failure during admission which was felt to be due to pulmonary edema initially but was also treated for an acute COPD exacerbation.  She had a recurrent admission from 10/30  - 02/05/2023 for evaluation of worsening shortness of breath and was found to be in atrial flutter with RVR with heart rate in the 140's. Was started on IV Cardizem but developed hypotension with this and transitioned to  IV Amiodarone. She underwent TEE/DCCV on 02/04/2023 and converted back to normal sinus rhythm. IV Amiodarone was stopped and she was transitioned to PO Amiodarone 200 mg twice daily for 3 weeks followed by 200 mg daily.  In talking with the patient and her niece today, she reports still having dyspnea on exertion since hospital discharge. Has known COPD and previously followed by Pulmonology in 2019 but no recent evaluation.Says her presenting symptom when in atrial flutter was dizziness and she denies any recurrence. Has noticed a worsening tremor since hospital discharge and being started on Amiodarone (had this intermittently before but has progressed). No recent  orthopnea, PND, pitting edema or chest pain. She has not been taking Cardizem CD as it was listed on her discharge summary to only take as needed and said for HR > 60 bpm and she thought this was < 60 bpm.   Studies Reviewed:   EKG: EKG is ordered today and demonstrates:   EKG Interpretation Date/Time:  Wednesday February 23 2023 15:30:25 EST Ventricular Rate:  71 PR Interval:  192 QRS Duration:  78 QT Interval:  440 QTC Calculation: 478 R Axis:   81  Text Interpretation: Normal sinus rhythm No acute ST changes. Confirmed by Randall An ([id number]) on 02/23/2023 4:44:41 PM       Echocardiogram: 09/2022 IMPRESSIONS     1. Left ventricular ejection fraction, by estimation, is 60 to 65%. The  left ventricle has normal function. The left ventricle has no regional  wall motion abnormalities. There is mild left ventricular hypertrophy.  Left ventricular diastolic parameters  are indeterminate. Elevated left atrial pressure.   2. Right ventricular systolic function is normal. The right ventricular  size is normal.   3. The mitral valve is abnormal. Mild mitral valve regurgitation. No  evidence of mitral stenosis.   4. The aortic valve is tricuspid. There is mild calcification of the  aortic valve. There is mild thickening of the aortic valve. Aortic valve  regurgitation is not visualized. No aortic stenosis is present.   5. The inferior vena cava is normal in size with greater than 50%  respiratory variability, suggesting right atrial pressure of 3 mmHg.   Risk Assessment/Calculations:    CHA2DS2-VASc Score = 4  This indicates a 4.8% annual risk of stroke. The patient's score is based upon: CHF History: 0 HTN History: 0 Diabetes History: 0 Stroke History: 0 Vascular Disease History: 1 Age Score: 2 Gender Score: 1    Physical Exam:   VS:  BP 100/60   Pulse 71   Ht 5\' 7"  (1.702 m)   Wt 130 lb 9.6 oz (59.2 kg)   BMI 20.45 kg/m    Wt Readings from Last 3  Encounters:  02/23/23 130 lb 9.6 oz (59.2 kg)  02/05/23 125 lb 10.6 oz (57 kg)  01/27/23 133 lb (60.3 kg)     GEN: Pleasant female appearing in no acute distress NECK: No JVD; No carotid bruits CARDIAC: RRR, no murmurs, rubs, gallops RESPIRATORY: No wheezing or rales.  Scattered rhonchi ABDOMEN: Appears non-distended. No obvious abdominal masses. EXTREMITIES: No clubbing or cyanosis. No pitting edema.  Distal pedal pulses are 2+ bilaterally.   Assessment and Plan:   1. Paroxysmal Atrial Flutter/Use of Long-term Anticoagulation - She denies any symptoms resembling her prior atrial flutter and is maintaining NSR by examination and EKG today. During her admission, Dr. Wyline Mood mentioned Amiodarone would not be ideal long-term given her COPD. Will reach out to him to see if he prefers for her to see EP to discuss alternatives to this. She is currently taking 200mg  BID and has experienced a worsening tremor, therefore I did recommend she go ahead and reduce to 200mg  once daily. Not on AV nodal blocking agents given hypotension (will discontinue Cardizem as she has not utilized this and BP limits use).  - Continue Eliquis 2.5mg  BID which is the appropriate dose at this time given her age, weight and kidney function. Will recheck a CBC and BMET.    2. CAD - She is s/p NSTEMI in 2017 with DES to LCx. Has dyspnea on exertion but no recent chest pain. Continue Crestor 20mg  daily. Not on ASA given the need for Eliquis.   3. Hypotension - BP is at 100/60 during today's visit. She has not been taking Cardizem CD 120mg  daily as discussed above and I recommended she remain off of it at this time given her hypotension.   4. Carotid Artery Stenosis  - Dopplers in 2019 showed 1 to 39% RICA stenosis and 40 to 59% LICA stenosis. Will plan for a follow-up carotid doppler study. Continue Crestor 20mg  daily. No longer on ASA given the need for anticoagulation.   5. Stage 3 CKD - Baseline creatinine 1.3 -1.5.   Peaked at 1.83 during her recent admission. Will recheck with upcoming labs.   6. Dyspnea on Exertion/COPD - I suspect her dyspnea is multifactorial in the setting of deconditioning, anemia and COPD. Will recheck a CBC today. She does not appear fluid overloaded and only consumes ~ 20-30 ounces of fluid each day, therefore will check a BNP but hold off on starting a diuretic.  - She does have known COPD and previously followed by Pulmonology but lost to follow-up. Will re-enter a referral to [facility] Pulmonology.   Signed, Ellsworth Lennox, PA-C

## 2023-02-23 NOTE — Patient Instructions (Signed)
Medication Instructions:  Your physician has recommended you make the following change in your medication:   Stop Taking Cardizem  Take Amiodarone 200 mg Daily   *If you need a refill on your cardiac medications before your next appointment, please call your pharmacy*   Lab Work: Your physician recommends that you return for lab work. BNP, BMET, CBC  If you have labs (blood work) drawn today and your tests are completely normal, you will receive your results only by: MyChart Message (if you have MyChart) OR A paper copy in the mail If you have any lab test that is abnormal or we need to change your treatment, we will call you to review the results.   Testing/Procedures: Your physician has requested that you have a carotid duplex. This test is an ultrasound of the carotid arteries in your neck. It looks at blood flow through these arteries that supply the brain with blood. Allow one hour for this exam. There are no restrictions or special instructions.    Follow-Up: At [facility], you and your health needs are our priority.  As part of our continuing mission to provide you with exceptional heart care, we have created designated Provider Care Teams.  These Care Teams include your primary Cardiologist (physician) and Advanced Practice Providers (APPs -  Physician Assistants and Nurse Practitioners) who all work together to provide you with the care you need, when you need it.  We recommend signing up for the patient portal called "MyChart".  Sign up information is provided on this After Visit Summary.  MyChart is used to connect with patients for Virtual Visits (Telemedicine).  Patients are able to view lab/test results, encounter notes, upcoming appointments, etc.  Non-urgent messages can be sent to your provider as well.   To learn more about what you can do with MyChart, go to [email or web address].    Your next appointment:    February   Provider:   You may see Dina Rich, MD or one of the following Advanced Practice Providers on your designated Care Team:   Randall An, PA-C  Jacolyn Reedy, [place]     Other Instructions Thank you for choosing [facility]!

## 2023-02-24 ENCOUNTER — Encounter: Payer: Self-pay | Admitting: Student

## 2023-02-25 ENCOUNTER — Telehealth: Payer: Self-pay | Admitting: Student

## 2023-02-25 DIAGNOSIS — I4892 Unspecified atrial flutter: Secondary | ICD-10-CM

## 2023-02-25 NOTE — Telephone Encounter (Signed)
Pt notified and voiced understanding 

## 2023-02-25 NOTE — Telephone Encounter (Signed)
   Please let the patient know that I did review with Dr. Wyline Mood and he recommended Electrophysiology evaluation for consideration of other antiarrhythmic options since Amiodarone is not ideal long-term given her COPD.  Will need referral entered for EP for Atrial Flutter.   Signed, Ellsworth Lennox, PA-C 02/25/2023, 1:35 PM

## 2023-02-27 DIAGNOSIS — R410 Disorientation, unspecified: Secondary | ICD-10-CM | POA: Diagnosis not present

## 2023-02-27 DIAGNOSIS — R0609 Other forms of dyspnea: Secondary | ICD-10-CM | POA: Diagnosis not present

## 2023-02-27 DIAGNOSIS — M810 Age-related osteoporosis without current pathological fracture: Secondary | ICD-10-CM | POA: Diagnosis not present

## 2023-02-27 DIAGNOSIS — E871 Hypo-osmolality and hyponatremia: Secondary | ICD-10-CM | POA: Diagnosis not present

## 2023-02-28 ENCOUNTER — Other Ambulatory Visit (HOSPITAL_COMMUNITY)
Admission: RE | Admit: 2023-02-28 | Discharge: 2023-02-28 | Disposition: A | Discharge status: Home / Self Care | Payer: 59 | Source: Ambulatory Visit | Attending: Cardiology | Admitting: Cardiology

## 2023-02-28 DIAGNOSIS — R0600 Dyspnea, unspecified: Secondary | ICD-10-CM | POA: Diagnosis not present

## 2023-02-28 DIAGNOSIS — Z79899 Other long term (current) drug therapy: Secondary | ICD-10-CM | POA: Insufficient documentation

## 2023-02-28 LAB — BASIC METABOLIC PANEL
Anion gap: 9 (ref 5–15)
BUN: 40 mg/dL — ABNORMAL HIGH (ref 8–23)
CO2: 21 mmol/L — ABNORMAL LOW (ref 22–32)
Calcium: 9.3 mg/dL (ref 8.9–10.3)
Chloride: 112 mmol/L — ABNORMAL HIGH (ref 98–111)
Creatinine, Ser: 1.8 mg/dL — ABNORMAL HIGH (ref 0.44–1.00)
GFR, Estimated: 28 mL/min — ABNORMAL LOW (ref >60)
Glucose, Bld: 103 mg/dL — ABNORMAL HIGH (ref 70–99)
Potassium: 4.6 mmol/L (ref 3.5–5.1)
Sodium: 142 mmol/L (ref 135–145)

## 2023-02-28 LAB — CBC
HCT: 36.7 % (ref 36.0–46.0)
Hemoglobin: 10.8 g/dL — ABNORMAL LOW (ref 12.0–15.0)
MCH: 29 pg (ref 26.0–34.0)
MCHC: 29.4 g/dL — ABNORMAL LOW (ref 30.0–36.0)
MCV: 98.7 fL (ref 80.0–100.0)
Platelets: DECREASED 10*3/uL (ref 150–400)
RBC: 3.72 MIL/uL — ABNORMAL LOW (ref 3.87–5.11)
RDW: 17.3 % — ABNORMAL HIGH (ref 11.5–15.5)
WBC: 5.5 10*3/uL (ref 4.0–10.5)
nRBC: 0 % (ref 0.0–0.2)

## 2023-02-28 LAB — BRAIN NATRIURETIC PEPTIDE: B Natriuretic Peptide: 59 pg/mL (ref 0.0–100.0)

## 2023-03-01 ENCOUNTER — Ambulatory Visit (HOSPITAL_COMMUNITY)
Admission: RE | Admit: 2023-03-01 | Discharge: 2023-03-01 | Disposition: A | Discharge status: Home / Self Care | Payer: 59 | Source: Ambulatory Visit | Attending: Student | Admitting: Student

## 2023-03-01 DIAGNOSIS — I6523 Occlusion and stenosis of bilateral carotid arteries: Secondary | ICD-10-CM | POA: Insufficient documentation

## 2023-03-11 ENCOUNTER — Telehealth: Payer: Self-pay | Admitting: Cardiology

## 2023-03-11 DIAGNOSIS — I4892 Unspecified atrial flutter: Secondary | ICD-10-CM

## 2023-03-11 DIAGNOSIS — I4891 Unspecified atrial fibrillation: Secondary | ICD-10-CM

## 2023-03-11 MED ORDER — APIXABAN 2.5 MG PO TABS: 2.5000 mg | ORAL_TABLET | Freq: Two times a day (BID) | ORAL | 1 refills | Status: DC

## 2023-03-11 NOTE — Telephone Encounter (Signed)
Spoke with pt who states that she was calling about her Eliquis and wanted to know if 134/70 was a good BP. Notified that it is within normal range. Pt unable to give a pulse. Encouraged to call office if Hr is above 100. Pt voiced understanding.

## 2023-03-11 NOTE — Telephone Encounter (Signed)
Pt c/o BP issue: STAT if pt c/o blurred vision, one-sided weakness or slurred speech  1. What are your last 5 BP readings? 134/70  2. Are you having any other symptoms (ex. Dizziness, headache, blurred vision, passed out)?    3. What is your BP issue?    Wants to speak to the nurse about her bp. Please advise

## 2023-03-11 NOTE — Telephone Encounter (Signed)
*  STAT* If patient is at the pharmacy, call can be transferred to refill team.   1. Which medications need to be refilled? (please list name of each medication and dose if known)   apixaban (ELIQUIS) 2.5 MG TABS tablet (Expired)    2. Which pharmacy/location (including street and city if local pharmacy) is medication to be sent to? [facility] - [place], [place] - [address]   3. Do they need a 30 day or 90 day supply? 90

## 2023-03-11 NOTE — Telephone Encounter (Signed)
Eliquis 2.5mg  refill request received. Patient is 82 years old, weight-59.2kg, Crea-1.80 on 02/28/23, Diagnosis-Aflutter/Afib, and last seen by [place] on 02/23/23 . Dose is appropriate based on dosing criteria. Will send in refill to requested pharmacy.

## 2023-03-24 DIAGNOSIS — H3561 Retinal hemorrhage, right eye: Secondary | ICD-10-CM | POA: Diagnosis not present

## 2023-03-24 DIAGNOSIS — H353231 Exudative age-related macular degeneration, bilateral, with active choroidal neovascularization: Secondary | ICD-10-CM | POA: Diagnosis not present

## 2023-03-24 DIAGNOSIS — H33001 Unspecified retinal detachment with retinal break, right eye: Secondary | ICD-10-CM | POA: Diagnosis not present

## 2023-03-24 DIAGNOSIS — H35051 Retinal neovascularization, unspecified, right eye: Secondary | ICD-10-CM | POA: Diagnosis not present

## 2023-03-24 DIAGNOSIS — H35341 Macular cyst, hole, or pseudohole, right eye: Secondary | ICD-10-CM | POA: Diagnosis not present

## 2023-03-24 DIAGNOSIS — H4311 Vitreous hemorrhage, right eye: Secondary | ICD-10-CM | POA: Diagnosis not present

## 2023-03-24 DIAGNOSIS — H3321 Serous retinal detachment, right eye: Secondary | ICD-10-CM | POA: Diagnosis not present

## 2023-03-29 ENCOUNTER — Ambulatory Visit: Payer: Self-pay | Admitting: *Deleted

## 2023-03-29 DIAGNOSIS — M810 Age-related osteoporosis without current pathological fracture: Secondary | ICD-10-CM | POA: Diagnosis not present

## 2023-03-29 DIAGNOSIS — R0609 Other forms of dyspnea: Secondary | ICD-10-CM | POA: Diagnosis not present

## 2023-03-29 DIAGNOSIS — E871 Hypo-osmolality and hyponatremia: Secondary | ICD-10-CM | POA: Diagnosis not present

## 2023-03-29 DIAGNOSIS — R410 Disorientation, unspecified: Secondary | ICD-10-CM | POA: Diagnosis not present

## 2023-03-29 NOTE — Patient Instructions (Addendum)
Visit Information  Thank you for taking time to visit with me today. Please don't hesitate to contact me if I can be of assistance to you.   Following are the goals we discussed today:   Goals Addressed             This Visit's Progress    Manage vision, weight, hearing & mobility needs -RN CM  care coordination   On track    Patient will make an appointment with an in network hearing provider/office with the assist of [email or web address] - 03/29/23 goal met Hearing aids obtained  Patient will obtain a smaller walker Resolved 02/09/23 Patient will continue to attend MD appointments Patient will have retinal detachment surgery on 04/20/23  Interventions Today    Flowsheet Row Most Recent Value  Chronic Disease   Chronic disease during today's visit Hypertension (HTN), Atrial Fibrillation (AFib), Other, Chronic Obstructive Pulmonary Disease (COPD)  [She does have her heaing aid, Had them on today , will have pre op on 04/01/23 for retinal surgery 04/20/23- 05/04/23 to be seen by Cardiology for imaging. Reviewed these visits Assessed for any worsening symptoms]  General Interventions   General Interventions Discussed/Reviewed General Interventions Reviewed, Doctor Visits, Durable Medical Equipment (DME)  Doctor Visits Discussed/Reviewed Doctor Visits Reviewed, PCP, Specialist  Durable Medical Equipment (DME) Other  [hearing aid]  PCP/Specialist Visits Compliance with follow-up visit  Exercise Interventions   Exercise Discussed/Reviewed Physical Activity, Exercise Reviewed  Physical Activity Discussed/Reviewed Physical Activity Reviewed  Latina Craver as active as possible at home]  Education Interventions   Education Provided Provided Education  [carotid imaging, retina detachment surgery]  Provided Verbal Education On Eye Care, Labs, Medication, [employer], Other  Labs Reviewed --  Pietro Cassis to discontinue blood thinner]  Mental Health Interventions   Mental Health Discussed/Reviewed  Mental Health Reviewed, Coping Strategies              Our next appointment is by telephone on 04/29/23 at 1130  Please call the care guide team at [phone] if you need to cancel or reschedule your appointment.   If you are experiencing a Mental Health or Behavioral Health Crisis or need someone to talk to, please call the Suicide and Crisis Lifeline: 988 call the [place] National Suicide Prevention Lifeline: (801)843-4064 or TTY: 878-393-1657 TTY 786-153-5660) to talk to a trained counselor call 1-800-273-TALK (toll free, 24 hour hotline) call the [facility]: 856-074-6451 call 911   Patient verbalizes understanding of instructions and care plan provided today and agrees to view in MyChart. Active MyChart status and patient understanding of how to access instructions and care plan via MyChart confirmed with patient.     The patient has been provided with contact information for the care management team and has been advised to call with any health related questions or concerns.   Rozina Pointer L. Noelle Penner, RN, BSN, [facility]  VBCI Care Management Coordinator  [phone]  Fax: [phone]

## 2023-03-29 NOTE — Patient Outreach (Addendum)
  Care Coordination   Follow Up Visit Note   03/29/2023 Name: Patricia Jackson MRN: [id number] DOB: [birth year]  Patricia Jackson is a 82 y.o. year old female who sees Patricia Jackson, Patricia Hazel, MD for primary care. I spoke with  Patricia Jackson by phone today.  What matters to the patients health and wellness today?  Retinal surgery 04/06/23, MD visits, Hearing aid    Goals Addressed             This Visit's Progress    Manage vision, weight, hearing & mobility needs -RN CM  care coordination   On track    Patient will make an appointment with an in network hearing provider/office with the assist of [email or web address] - 03/29/23 goal met Hearing aids obtained  Patient will obtain a smaller walker Resolved 02/09/23 Patient will continue to attend MD appointments Patient will have retinal detachment surgery on 04/20/23  Interventions Today    Flowsheet Row Most Recent Value  Chronic Disease   Chronic disease during today's visit Hypertension (HTN), Atrial Fibrillation (AFib), Other, Chronic Obstructive Pulmonary Disease (COPD)  [She does have her heaing aid, Had them on today , will have pre op on 04/01/23 for retinal surgery 04/20/23- 05/04/23 to be seen by Cardiology for imaging. Reviewed these visits Assessed for any worsening symptoms]  General Interventions   General Interventions Discussed/Reviewed General Interventions Reviewed, Doctor Visits, Durable Medical Equipment (DME)  Doctor Visits Discussed/Reviewed Doctor Visits Reviewed, PCP, Specialist  Durable Medical Equipment (DME) Other  [hearing aid]  PCP/Specialist Visits Compliance with follow-up visit  Exercise Interventions   Exercise Discussed/Reviewed Physical Activity, Exercise Reviewed  Physical Activity Discussed/Reviewed Physical Activity Reviewed  Patricia Jackson as active as possible at home]  Education Interventions   Education Provided Provided Education  [carotid imaging, retina detachment surgery]  Provided Verbal Education On Eye  Care, Labs, Medication, [facility], Other  Labs Reviewed --  Patricia Jackson to discontinue blood thinner]  Mental Health Interventions   Mental Health Discussed/Reviewed Mental Health Reviewed, Coping Strategies              SDOH assessments and interventions completed:  No     Care Coordination Interventions:  Yes, provided   Follow up plan: Follow up call scheduled for 04/29/23    Encounter Outcome:  Patient Visit Completed   Cala Bradford L. Noelle Penner, RN, BSN, [facility]  VBCI Care Management Coordinator  [phone]  Fax: [phone]

## 2023-04-01 ENCOUNTER — Telehealth: Payer: Self-pay | Admitting: *Deleted

## 2023-04-01 DIAGNOSIS — H3342 Traction detachment of retina, left eye: Secondary | ICD-10-CM | POA: Diagnosis not present

## 2023-04-01 DIAGNOSIS — H35022 Exudative retinopathy, left eye: Secondary | ICD-10-CM | POA: Diagnosis not present

## 2023-04-01 DIAGNOSIS — E782 Mixed hyperlipidemia: Secondary | ICD-10-CM | POA: Diagnosis not present

## 2023-04-01 DIAGNOSIS — I480 Paroxysmal atrial fibrillation: Secondary | ICD-10-CM | POA: Diagnosis not present

## 2023-04-01 DIAGNOSIS — E871 Hypo-osmolality and hyponatremia: Secondary | ICD-10-CM | POA: Diagnosis not present

## 2023-04-01 DIAGNOSIS — I129 Hypertensive chronic kidney disease with stage 1 through stage 4 chronic kidney disease, or unspecified chronic kidney disease: Secondary | ICD-10-CM | POA: Diagnosis not present

## 2023-04-01 DIAGNOSIS — R0609 Other forms of dyspnea: Secondary | ICD-10-CM | POA: Diagnosis not present

## 2023-04-01 DIAGNOSIS — H353124 Nonexudative age-related macular degeneration, left eye, advanced atrophic with subfoveal involvement: Secondary | ICD-10-CM | POA: Diagnosis not present

## 2023-04-01 DIAGNOSIS — I7300 Raynaud's syndrome without gangrene: Secondary | ICD-10-CM | POA: Diagnosis not present

## 2023-04-01 DIAGNOSIS — H35349 Macular cyst, hole, or pseudohole, unspecified eye: Secondary | ICD-10-CM | POA: Diagnosis not present

## 2023-04-01 DIAGNOSIS — E079 Disorder of thyroid, unspecified: Secondary | ICD-10-CM | POA: Diagnosis not present

## 2023-04-01 DIAGNOSIS — I10 Essential (primary) hypertension: Secondary | ICD-10-CM | POA: Diagnosis not present

## 2023-04-01 DIAGNOSIS — J432 Centrilobular emphysema: Secondary | ICD-10-CM | POA: Diagnosis not present

## 2023-04-01 DIAGNOSIS — N184 Chronic kidney disease, stage 4 (severe): Secondary | ICD-10-CM | POA: Diagnosis not present

## 2023-04-01 DIAGNOSIS — R001 Bradycardia, unspecified: Secondary | ICD-10-CM | POA: Diagnosis not present

## 2023-04-01 DIAGNOSIS — Z01818 Encounter for other preprocedural examination: Secondary | ICD-10-CM | POA: Diagnosis not present

## 2023-04-01 DIAGNOSIS — K219 Gastro-esophageal reflux disease without esophagitis: Secondary | ICD-10-CM | POA: Diagnosis not present

## 2023-04-01 DIAGNOSIS — D508 Other iron deficiency anemias: Secondary | ICD-10-CM | POA: Diagnosis not present

## 2023-04-01 DIAGNOSIS — J8410 Pulmonary fibrosis, unspecified: Secondary | ICD-10-CM | POA: Diagnosis not present

## 2023-04-01 DIAGNOSIS — Z7901 Long term (current) use of anticoagulants: Secondary | ICD-10-CM | POA: Diagnosis not present

## 2023-04-01 DIAGNOSIS — I214 Non-ST elevation (NSTEMI) myocardial infarction: Secondary | ICD-10-CM | POA: Diagnosis not present

## 2023-04-01 DIAGNOSIS — Z95820 Peripheral vascular angioplasty status with implants and grafts: Secondary | ICD-10-CM | POA: Diagnosis not present

## 2023-04-01 DIAGNOSIS — I4892 Unspecified atrial flutter: Secondary | ICD-10-CM | POA: Diagnosis not present

## 2023-04-01 NOTE — Telephone Encounter (Signed)
    From a cardiac perspective, she was doing well and would be of acceptable risk to proceed. Recent TEE showed a preserved EF of 55-60% with no regional WMA. She did have dyspnea in the setting of known COPD so would recommend they receive clearance from her PCP for this if she requires general anesthesia (listed as only needing MAC) since she has not been evaluated by Pulmonology in several years.   Signed, Ellsworth Lennox, PA-C 04/01/2023, 2:44 PM

## 2023-04-01 NOTE — Telephone Encounter (Signed)
Patient with diagnosis of afib on Eliquis for anticoagulation.    Procedure: REPAIR OF COMPLEX RETINAL DETACHMENT; 25G , WITH VITRECTOMY AND MEMBRANE PEELING, TAMPONADE, CRYOTHERAPY, ENDOLASER PHOTOCOAGULATION, DRAINAGE SUBRETINAL FLUID, SCLERAL BUCKLING, REMOVAL OF LENS  Date of procedure: 04/20/2023   CHA2DS2-VASc Score = 5   This indicates a 7.2% annual risk of stroke. The patient's score is based upon: CHF History: 0 HTN History: 1 Diabetes History: 0 Stroke History: 0 Vascular Disease History: 1 Age Score: 2 Gender Score: 1     CrCl 68mL/min Platelet count: 113 (02/2023)   Last Cardioversion - 02/05/2023 Per office protocol, patient can hold Eliquis for 3 days prior to procedure.     **This guidance is not considered finalized until pre-operative APP has relayed final recommendations.**

## 2023-04-01 NOTE — Telephone Encounter (Signed)
   Patient Name: Patricia Jackson  DOB: 12/03/1940 MRN: [id number]  Primary Cardiologist: Dina Rich, MD Procedure: REPAIR OF COMPLEX RETINAL DETACHMENT; 25G , WITH VITRECTOMY AND MEMBRANE PEELING, TAMPONADE, CRYOTHERAPY, ENDOLASER PHOTOCOAGULATION, DRAINAGE SUBRETINAL FLUID, SCLERAL BUCKLING, REMOVAL OF LENS   Chart reviewed as part of pre-operative protocol coverage.   Per Randall An, PA who saw patient on 02/23/23, "From a cardiac perspective, she was doing well and would be of acceptable risk to proceed. Recent TEE showed a preserved EF of 55-60% with no regional WMA. She did have dyspnea in the setting of known COPD so would recommend they receive clearance from her PCP for this if she requires general anesthesia (listed as only needing MAC) since she has not been evaluated by Pulmonology in several years."   Per office protocol, patient can hold Eliquis for 3 days prior to procedure.  Please resume when safe to do so from a bleeding standpoint.   I will route this recommendation to the requesting party via Epic fax function and remove from pre-op pool.  Please call with questions.  Rip Harbour, NP 04/01/2023, 3:45 PM

## 2023-04-01 NOTE — Telephone Encounter (Signed)
   Pre-operative Risk Assessment    Patient Name: Patricia Jackson  DOB: [birth year] MRN: [id number]   Date of last office visit: 02/23/23 Randall An, [facility] Date of next office visit: 05/04/23 DR. TAYLOR-CONSULT; 05/12/23 DR. BRANCH   Request for Surgical Clearance    Procedure:  REPAIR OF COMPLEX RETINAL DETACHMENT; 25G , WITH VITRECTOMY AND MEMBRANE PEELING, TAMPONADE, CRYOTHERAPY, ENDOLASER PHOTOCOAGULATION, DRAINAGE SUBRETINAL FLUID, SCLERAL BUCKLING, REMOVAL OF LENS  Date of Surgery:  Clearance 04/20/23                                Surgeon: NOT LISTED Surgeon's Group or Practice Name:  [facility] Phone number:  [phone] Fax number:  [phone]   Type of Clearance Requested:   - Medical  - Pharmacy:  Hold Apixaban (Eliquis) x 3 DAYS PRIOR   Type of Anesthesia:  MAC WITH PERIBULBAR BLOCK    Additional requests/questions:    [name]   04/01/2023, 10:05 AM

## 2023-04-20 ENCOUNTER — Other Ambulatory Visit: Payer: Self-pay | Admitting: Cardiology

## 2023-04-25 DIAGNOSIS — R809 Proteinuria, unspecified: Secondary | ICD-10-CM | POA: Diagnosis not present

## 2023-04-25 DIAGNOSIS — N2581 Secondary hyperparathyroidism of renal origin: Secondary | ICD-10-CM | POA: Diagnosis not present

## 2023-04-25 DIAGNOSIS — I129 Hypertensive chronic kidney disease with stage 1 through stage 4 chronic kidney disease, or unspecified chronic kidney disease: Secondary | ICD-10-CM | POA: Diagnosis not present

## 2023-04-25 DIAGNOSIS — I7300 Raynaud's syndrome without gangrene: Secondary | ICD-10-CM | POA: Diagnosis not present

## 2023-04-25 DIAGNOSIS — R799 Abnormal finding of blood chemistry, unspecified: Secondary | ICD-10-CM | POA: Diagnosis not present

## 2023-04-25 DIAGNOSIS — N189 Chronic kidney disease, unspecified: Secondary | ICD-10-CM | POA: Diagnosis not present

## 2023-04-25 DIAGNOSIS — D631 Anemia in chronic kidney disease: Secondary | ICD-10-CM | POA: Diagnosis not present

## 2023-04-25 DIAGNOSIS — R768 Other specified abnormal immunological findings in serum: Secondary | ICD-10-CM | POA: Diagnosis not present

## 2023-04-25 DIAGNOSIS — N1832 Chronic kidney disease, stage 3b: Secondary | ICD-10-CM | POA: Diagnosis not present

## 2023-04-27 ENCOUNTER — Encounter (HOSPITAL_COMMUNITY): Payer: Self-pay | Admitting: Hematology

## 2023-04-27 LAB — LAB REPORT - SCANNED
Albumin, Urine POC: 23.2
Creatinine, POC: 75.5 mg/dL
EGFR: 35
Microalb Creat Ratio: 31

## 2023-04-29 DIAGNOSIS — E871 Hypo-osmolality and hyponatremia: Secondary | ICD-10-CM | POA: Diagnosis not present

## 2023-04-29 DIAGNOSIS — M810 Age-related osteoporosis without current pathological fracture: Secondary | ICD-10-CM | POA: Diagnosis not present

## 2023-04-29 DIAGNOSIS — R410 Disorientation, unspecified: Secondary | ICD-10-CM | POA: Diagnosis not present

## 2023-04-29 DIAGNOSIS — R0609 Other forms of dyspnea: Secondary | ICD-10-CM | POA: Diagnosis not present

## 2023-05-03 ENCOUNTER — Encounter (HOSPITAL_COMMUNITY): Payer: Self-pay | Admitting: Hematology

## 2023-05-03 ENCOUNTER — Encounter: Payer: Self-pay | Admitting: Nephrology

## 2023-05-04 ENCOUNTER — Ambulatory Visit: Payer: 59 | Attending: Internal Medicine | Admitting: Internal Medicine

## 2023-05-04 ENCOUNTER — Encounter: Payer: Self-pay | Admitting: Internal Medicine

## 2023-05-04 VITALS — BP 118/62 | HR 63 | Ht 68.0 in | Wt 134.4 lb

## 2023-05-04 DIAGNOSIS — I10 Essential (primary) hypertension: Secondary | ICD-10-CM | POA: Diagnosis not present

## 2023-05-04 MED ORDER — AMIODARONE HCL 200 MG PO TABS: ORAL_TABLET | ORAL | 3 refills | Status: DC

## 2023-05-04 NOTE — Progress Notes (Signed)
HPI Ms. Iracheta is referred by [place] for evaluation of atrial arrhythmias. She is a pleasant 83 yo woman with COPD, former smoker, carotid stenosis, and remote GI bleeding. The patient has had 2 hospitalizations for rapid atrial fib/flutter. I have reviewed the ECG's. She has both atrial fib and atypical flutter. She was placed on amiodarone and her symptoms appear to be well controlled. No syncope.  Allergies  Allergen Reactions   Povidone-Iodine Other (See Comments)    Burning   Sulfa Antibiotics Other (See Comments)    Unknown reaction    Sulfamethoxazole Other (See Comments)    unknown     Current Outpatient Medications  Medication Sig Dispense Refill   acetaminophen (TYLENOL) 500 MG tablet Take 1,000 mg by mouth every 8 (eight) hours as needed for mild pain or moderate pain.     albuterol (PROVENTIL) (2.5 MG/3ML) 0.083% nebulizer solution Take 2.5 mg by nebulization every 6 (six) hours as needed for wheezing or shortness of breath.     amiodarone (PACERONE) 200 MG tablet TAKE ONE TABLET (200 MG TOTAL) BY MOUTH DAILY. 30 tablet 0   amLODipine-olmesartan (AZOR) 10-40 MG tablet Take 1 tablet by mouth daily.     apixaban (ELIQUIS) 2.5 MG TABS tablet Take 1 tablet (2.5 mg total) by mouth 2 (two) times daily. 180 tablet 1   atropine 1 % ophthalmic solution Place 1 drop into both eyes 3 (three) times daily.     buPROPion (WELLBUTRIN XL) 150 MG 24 hr tablet Take 150 mg by mouth daily.     Cholecalciferol (VITAMIN D3) 50 MCG (2000 UT) TABS Take 2,000 Units by mouth daily.     ferrous sulfate 325 (65 FE) MG EC tablet Take 1 tablet (325 mg total) by mouth daily with breakfast.     levothyroxine (SYNTHROID) 50 MCG tablet Take 50 mcg by mouth daily before breakfast.      lidocaine (LIDODERM) 5 % Place 1 patch onto the skin daily. Remove & Discard patch within 12 hours or as directed by MD 30 patch 0   nitroGLYCERIN (NITROSTAT) 0.4 MG SL tablet Place 0.4 mg under the tongue every  5 (five) minutes as needed for chest pain.     rosuvastatin (CRESTOR) 20 MG tablet Take 20 mg by mouth daily.      sertraline (ZOLOFT) 25 MG tablet Take 25 mg by mouth daily.     TRELEGY ELLIPTA 200-62.5-25 MCG/ACT AEPB Inhale 1 puff into the lungs daily.     No current facility-administered medications for this visit.     Past Medical History:  Diagnosis Date   A-fib [facility])    Atrial flutter (HCC)    On Eliquis in 8/17 but discontinued after stent placement   Breast cancer (HCC)    remote   CAD in native artery 03/16/2016   COPD (chronic obstructive pulmonary disease) (HCC)    Dysrhythmia    Hypertension    Iron deficiency anemia 06/12/2021   NSTEMI (non-ST elevated myocardial infarction) (HCC)    2017   S/P angioplasty with stent 03/15/16 DES Resolute, pLCX 03/16/2016   Vitreous hemorrhage of left eye (HCC) 08/01/2019    ROS:   All systems reviewed and negative except as noted in the HPI.   Past Surgical History:  Procedure Laterality Date   APPENDECTOMY     BREAST SURGERY Left    CARDIAC CATHETERIZATION N/A 03/15/2016   Procedure: Left Heart Cath and Coronary Angiography;  Surgeon: Lyn Records, MD;  Location: [facility] INVASIVE CV LAB;  Service: Cardiovascular;  Laterality: N/A;   CARDIAC CATHETERIZATION N/A 03/15/2016   Procedure: Coronary Stent Intervention;  Surgeon: Lyn Records, MD;  Location[facility] INVASIVE CV LAB;  Service: Cardiovascular;  Laterality: N/A;   CARDIOVERSION N/A 02/04/2023   Procedure: CARDIOVERSION;  Surgeon: Marjo Bicker, MD;  Location: [facility] ORS;  Service: Cardiovascular;  Laterality: N/A;   COLONOSCOPY     remote   COLONOSCOPY N/A 05/26/2016   Procedure: COLONOSCOPY;  Surgeon: Corbin Ade, MD;  Location: [facility] ENDO SUITE;  Service: Endoscopy;  Laterality: N/A;  845   ESOPHAGOGASTRODUODENOSCOPY N/A 05/26/2016   Procedure: ESOPHAGOGASTRODUODENOSCOPY (EGD);  Surgeon: Corbin Ade, MD;  Location: [facility] ENDO SUITE;  Service: Endoscopy;  Laterality:  N/A;   ESOPHAGOGASTRODUODENOSCOPY (EGD) WITH PROPOFOL N/A 11/22/2022   Procedure: ESOPHAGOGASTRODUODENOSCOPY (EGD) WITH PROPOFOL;  Surgeon: Lanelle Bal, DO;  Location: [facility] ENDO [facility]ITE;  Service: Endoscopy;  Laterality: N/A;   EYE SURGERY     EYE SURGERY Left    Dr. Barbaraann Barthel   HOT HEMOSTASIS  11/22/2022   Procedure: HOT HEMOSTASIS (ARGON PLASMA COAGULATION/BICAP);  Surgeon: Lanelle Bal, DO;  Location: [facility] END[facility];  Service: Endoscopy;;   MALONEY DILATION N/A 05/26/2016   Procedure: Elease Hashimoto DILATION;  Surgeon: Corbin Ade, MD;  Location: [facility];  Service: Endoscopy;  Laterality: N/A;   TEE WITHOUT CARDIOVERSION N/A 02/04/2023   Procedure: TRANSESOPHAGEAL ECHOCARDIOGRAM (TEE);  Surgeon: Marjo Bicker, MD;  Location: [facility] ORS;  Service: Cardiovascular;  Laterality: N/A;   TOOTH EXTRACTION N/A 01/15/2022   Procedure: DENTAL RESTORATION/EXTRACTIONS;  Surgeon: Ocie Doyne, DMD;  Location: [facility] OR;  Service: Oral Surgery;  Laterality: N/A;   VITRECTOMY Left 10/03/2019   Dr. Luciana Axe, Vitrectomy, Focal Laser, Removal of Silicone Oil     Family History  Problem Relation Age of Onset   Heart disease Mother    COPD Father    Cancer Sister        unknown primary   Cancer Brother        unknown primary   Cancer Brother    Hypertension Son    Colon cancer Neg Hx      Social History   Socioeconomic History   Marital status: Divorced    Spouse name: Not on file   Number of children: 1   Years of education: Not on file   Highest education level: Not on file  Occupational History   Occupation: Retired  Tobacco Use   Smoking status: Former    Current packs/day: 0.00    Types: Cigarettes    Quit date: 12/28/2015    Years since quitting: 7.3    Passive exposure: Past   Smokeless tobacco: Never   Tobacco comments:    Patient quit within the last 10 years  Vaping Use   Vaping status: Never Used  Substance and Sexual Activity   Alcohol use: Never   Drug use: Never    Sexual activity: Not Currently  Other Topics Concern   Not on file  Social History Narrative   ** Merged History Encounter **       Social Drivers of Health   Financial Resource Strain: Low Risk  (11/26/2022)   Overall Financial Resource Strain (CARDIA)    Difficulty of Paying Living Expenses: Not hard at all  Food Insecurity: No Food Insecurity (02/02/2023)   Hunger Vital Sign    Worried About Running Out of Food in the Last Year: Never true  Ran Out of Food in the Last Year: Never true  Transportation Needs: No Transportation Needs (02/02/2023)   PRAPARE - Administrator, Civil Service (Medical): No    Lack of Transportation (Non-Medical): No  Physical Activity: Not on file  Stress: No Stress Concern Present (11/26/2022)   [employer] of Occupational Health - Occupational Stress Questionnaire    Feeling of Stress : Not at all  Social Connections: Moderately Integrated (11/26/2022)   Social Connection and Isolation Panel [NHANES]    Frequency of Communication with Friends and Family: Twice a week    Frequency of Social Gatherings with Friends and Family: Twice a week    Attends Religious Services: 1 to 4 times per year    Active Member of [employer] or Organizations: Yes    Attends Banker Meetings: 1 to 4 times per year    Marital Status: Divorced  Catering manager Violence: Not At Risk (02/02/2023)   Humiliation, Afraid, Rape, and Kick questionnaire    Fear of Current or Ex-Partner: No    Emotionally Abused: No    Physically Abused: No    Sexually Abused: No     BP 118/62   Pulse 63   Ht 5\' 8"  (1.727 m)   Wt 134 lb 6.4 oz (61 kg)   SpO2 98%   BMI 20.44 kg/m   Physical Exam:  Well appearing NAD HEENT: Unremarkable except for poor dentition. Neck:  No JVD, no thyromegally Lymphatics:  No adenopathy Back:  No CVA tenderness Lungs:  Clear with no wheezes HEART:  Regular rate rhythm, no murmurs, no rubs, no clicks Abd:  soft, positive  bowel sounds, no organomegally, no rebound, no guarding Ext:  2 plus pulses, no edema, no cyanosis, no clubbing Skin:  No rashes no nodules Neuro:  CN II through XII intact, motor grossly intact  EKG - nsr   Assess/Plan: Paroxysmal atrial arrhythmias - she is maintaining NSR on low dose amio and I will ask her to reduce to 200mg  daily, six days a week. No other medical options. Her age makes afib ablation relative contraindication. AV node ablation and PPM would be next best if she is intolerant of amiodarone. Her QT is too long for dofetilide. Also renal dysfunction.   Sharlot Gowda Kashae Carstens,MD

## 2023-05-04 NOTE — Patient Instructions (Signed)
Medication Instructions:   Your physician has recommended you make the following change in your medication:   Decrease Amiodarone to 200 mg daily and none on Sunday   *If you need a refill on your cardiac medications before your next appointment, please call your pharmacy*   Lab Work: NONE   If you have labs (blood work) drawn today and your tests are completely normal, you will receive your results only by: MyChart Message (if you have MyChart) OR A paper copy in the mail If you have any lab test that is abnormal or we need to change your treatment, we will call you to review the results.   Testing/Procedures: NONE    Follow-Up: At [facility], you and your health needs are our priority.  As part of our continuing mission to provide you with exceptional heart care, we have created designated Provider Care Teams.  These Care Teams include your primary Cardiologist (physician) and Advanced Practice Providers (APPs -  Physician Assistants and Nurse Practitioners) who all work together to provide you with the care you need, when you need it.  We recommend signing up for the patient portal called "MyChart".  Sign up information is provided on this After Visit Summary.  MyChart is used to connect with patients for Virtual Visits (Telemedicine).  Patients are able to view lab/test results, encounter notes, upcoming appointments, etc.  Non-urgent messages can be sent to your provider as well.   To learn more about what you can do with MyChart, go to [email or web address].    Your next appointment:   1 year(s)  Provider:   Lewayne Bunting, MD    Other Instructions Thank you for choosing [facility]!

## 2023-05-05 DIAGNOSIS — E875 Hyperkalemia: Secondary | ICD-10-CM | POA: Diagnosis not present

## 2023-05-06 ENCOUNTER — Telehealth: Payer: Self-pay | Admitting: Cardiology

## 2023-05-06 NOTE — Telephone Encounter (Signed)
Patient has appt this coming Thursday, 2/6.  She just saw Dr. Ladona Jackson on 1/29, she wants to know if she still needs to come to this appt with Dr. Wyline Mood.

## 2023-05-06 NOTE — Telephone Encounter (Signed)
Patient notified to keep general cards apt with Dr.Branch.

## 2023-05-12 ENCOUNTER — Encounter: Payer: Self-pay | Admitting: Cardiology

## 2023-05-12 ENCOUNTER — Ambulatory Visit: Payer: 59 | Admitting: Cardiology

## 2023-05-12 ENCOUNTER — Ambulatory Visit: Payer: 59 | Attending: Cardiology | Admitting: Cardiology

## 2023-05-12 VITALS — BP 124/72 | HR 62 | Ht 67.0 in | Wt 137.0 lb

## 2023-05-12 DIAGNOSIS — I4892 Unspecified atrial flutter: Secondary | ICD-10-CM | POA: Diagnosis not present

## 2023-05-12 DIAGNOSIS — I10 Essential (primary) hypertension: Secondary | ICD-10-CM | POA: Diagnosis not present

## 2023-05-12 DIAGNOSIS — I2510 Atherosclerotic heart disease of native coronary artery without angina pectoris: Secondary | ICD-10-CM | POA: Diagnosis not present

## 2023-05-12 DIAGNOSIS — E782 Mixed hyperlipidemia: Secondary | ICD-10-CM | POA: Diagnosis not present

## 2023-05-12 NOTE — Progress Notes (Signed)
 Clinical Summary Patricia Jackson is a 83 y.o.female seen today for follow up of the following medical problems.      1. CAD -DES to LCX in 2017 in setting of NSTEMI - 09/2022 echo: LVEF 60-65%, no WMAs   - rare infrequent chest pains, dull pain. Lasts about 1 second.  - chronic SOB, mild cough related to her COPD  - no chest pains, SOB up and down. Some sinus congestion.  - compliant with meds     2. Aflutter/afib - admit 11/2015 pneumonia, found to be in aflutter - followed by EP Dr Waddell  - 02/2023 DCCV, she is on amio - rare palpitations - no bleeding on eliquis .    3. COPD - followed by pulmonary     4. Anemia - elquis stopped during prior admission with anemia Jan 2018, transfused at that time. She was on brilinta  and eliquis  at the time, changed to just plavix .        4. Raynauds syndrome - followed by rheum     5. HTN -she reports amlodopine/olmesartan  combo prevously stopped due to low bp's   6.Carotid stenosis - 06/2017 carotid US : RICA 1-39%, LICA 40-59%  02/2023 carotid US : RICA no significant stenosis, LICA 50-69% 7. GI bleed - admit 11/2022 with GI bleed - Hgb 6 g/dL on admission; received 2 units PRBC. 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 HLD - 10/2022 TC 137 TG 135 HDL 44 LDL 69  Past Medical History:  Diagnosis Date   A-fib [facility])    Atrial flutter (HCC)    On Eliquis  in 8/17 but discontinued after stent placement   Breast cancer (HCC)    remote   CAD in native artery 03/16/2016   COPD (chronic obstructive pulmonary disease) (HCC)    Dysrhythmia    Hypertension    Iron  deficiency anemia 06/12/2021   NSTEMI (non-ST elevated myocardial infarction) (HCC)    2017   S/P angioplasty with stent 03/15/16 DES Resolute, pLCX 03/16/2016   Vitreous hemorrhage of left eye (HCC) 08/01/2019     Allergies  Allergen Reactions   Povidone-Iodine Other (See Comments)     Burning   Sulfa Antibiotics Other (See Comments)    Unknown reaction    Sulfamethoxazole Other (See Comments)    unknown     Current Outpatient Medications  Medication Sig Dispense Refill   acetaminophen  (TYLENOL ) 500 MG tablet Take 1,000 mg by mouth every 8 (eight) hours as needed for mild pain or moderate pain.     albuterol  (PROVENTIL ) (2.5 MG/3ML) 0.083% nebulizer solution Take 2.5 mg by nebulization every 6 (six) hours as needed for wheezing or shortness of breath.     amiodarone  (PACERONE ) 200 MG tablet Take 1 Tablet ( 200 mg ) Daily and none on Sunday 90 tablet 3   amLODipine -olmesartan  (AZOR ) 10-40 MG tablet Take 1 tablet by mouth daily.     apixaban  (ELIQUIS ) 2.5 MG TABS tablet Take 1 tablet (2.5 mg total) by mouth 2 (two) times daily. 180 tablet 1   atropine  1 % ophthalmic solution Place 1 drop into both eyes 3 (three) times daily.     buPROPion  (WELLBUTRIN  XL) 150 MG 24 hr tablet Take 150 mg by mouth daily.     Cholecalciferol (VITAMIN D3) 50 MCG (2000 UT) TABS Take 2,000 Units by mouth daily.     ferrous sulfate   325 (65 FE) MG EC tablet Take 1 tablet (325 mg total) by mouth daily with breakfast.     levothyroxine  (SYNTHROID ) 50 MCG tablet Take 50 mcg by mouth daily before breakfast.      lidocaine  (LIDODERM ) 5 % Place 1 patch onto the skin daily. Remove & Discard patch within 12 hours or as directed by MD 30 patch 0   nitroGLYCERIN  (NITROSTAT ) 0.4 MG SL tablet Place 0.4 mg under the tongue every 5 (five) minutes as needed for chest pain.     rosuvastatin  (CRESTOR ) 20 MG tablet Take 20 mg by mouth daily.      sertraline  (ZOLOFT ) 25 MG tablet Take 25 mg by mouth daily.     TRELEGY ELLIPTA 200-62.5-25 MCG/ACT AEPB Inhale 1 puff into the lungs daily.     No current facility-administered medications for this visit.     Past Surgical History:  Procedure Laterality Date   APPENDECTOMY     BREAST SURGERY Left    CARDIAC CATHETERIZATION N/A 03/15/2016   Procedure: Left Heart  Cath and Coronary Angiography;  Surgeon: Victory LELON Sharps, MD;  Location: [facility] INVASIVE CV LAB;  Service: Cardiovascular;  Laterality: N/A;   CARDIAC CATHETERIZATION N/A 03/15/2016   Procedure: Coronary Stent Intervention;  Surgeon: Victory LELON Sharps, MD;  Location: [facility] INVASIVE CV LAB;  Service: Cardiovascular;  Laterality: N/A;   CARDIOVERSION N/A 02/04/2023   Procedure: CARDIOVERSION;  Surgeon: Stacia Diannah SQUIBB, MD;  Location: [facility] ORS;  Service: Cardiovascular;  Laterality: N/A;   COLONOSCOPY     remote   COLONOSCOPY N/A 05/26/2016   Procedure: COLONOSCOPY;  Surgeon: Lamar CHRISTELLA Hollingshead, MD;  Location: [facility] ENDO SUITE;  Service: Endoscopy;  Laterality: N/A;  845   ESOPHAGOGASTRODUODENOSCOPY N/A 05/26/2016   Procedure: ESOPHAGOGASTRODUODENOSCOPY (EGD);  Surgeon: Lamar CHRISTELLA Hollingshead, MD;  Location: [facility];  Service: Endoscopy;  Laterality: N/A;   ESOPHAGOGASTRODUODENOSCOPY (EGD) WITH PROPOFOL  N/A 11/22/2022   Procedure: ESOPHAGOGASTRODUODENOSCOPY (EGD) WITH PROPOFOL ;  Surgeon: Cindie Carlin POUR, DO;  Location: [facility];  Service: Endoscopy;  Laterality: N/A;   EYE SURGERY     EYE SURGERY Left    Dr. Loretha   HOT HEMOSTASIS  11/22/2022   Procedure: HOT HEMOSTASIS (ARGON PLASMA COAGULATION/BICAP);  Surgeon: Cindie Carlin POUR, DO;  Location: [facility];  Service: Endoscopy;;   Jackson DILATION N/A 05/26/2016   Procedure: AGAPITO DILATION;  Surgeon: Lamar CHRISTELLA Hollingshead, MD;  Location: [facility];  Service: Endoscopy;  Laterality: N/A;   TEE WITHOUT CARDIOVERSION N/A 02/04/2023   Procedure: TRANSESOPHAGEAL ECHOCARDIOGRAM (TEE);  Surgeon: Mallipeddi, Vishnu P, MD;  Location: [facility] ORS;  Service: Cardiovascular;  Laterality: N/A;   TOOTH EXTRACTION N/A 01/15/2022   Procedure: DENTAL RESTORATION/EXTRACTIONS;  Surgeon: Sheryle Hamilton, DMD;  Location: [facility] OR;  Service: Oral Surgery;  Laterality: N/A;   VITRECTOMY Left 10/03/2019   Dr. Elner, Vitrectomy, Focal Laser, Removal of Silicone Oil     Allergies   Allergen Reactions   Povidone-Iodine Other (See Comments)    Burning   Sulfa Antibiotics Other (See Comments)    Unknown reaction    Sulfamethoxazole Other (See Comments)    unknown      Family History  Problem Relation Age of Onset   Heart disease Mother    COPD Father    Cancer Sister        unknown primary   Cancer Brother        unknown primary   Cancer Brother    Hypertension Son  Colon cancer Neg Hx      Social History Ms. Handrich reports that she quit smoking about 7 years ago. Her smoking use included cigarettes. She has been exposed to tobacco smoke. She has never used smokeless tobacco. Ms. Tarman reports no history of alcohol use.   Physical Examination Today's Vitals   05/12/23 1120  BP: 124/72  Pulse: 62  Weight: 137 lb (62.1 kg)  Height: 5' 7 (1.702 m)   Body mass index is 21.46 kg/m.  Gen: resting comfortably, no acute distress HEENT: no scleral icterus, pupils equal round and reactive, no palptable cervical adenopathy,  CV: RRR, no mrg, no jvd Resp: Clear to auscultation bilaterally GI: abdomen is soft, non-tender, non-distended, normal bowel sounds, no hepatosplenomegaly MSK: extremities are warm, no edema.  Skin: warm, no rash Neuro:  no focal deficits Psych: appropriate affect   Diagnostic Studies   11/2013 Echo Study Conclusions  - Left ventricle: The cavity size was normal. Wall thickness was at the upper limits of normal. Systolic function was vigorous. The estimated ejection fraction was in the range of 65% to 70%. Wall motion was normal; there were no regional wall motion abnormalities. Doppler parameters are consistent with abnormal left ventricular relaxation (grade 1 diastolic dysfunction). Doppler parameters are consistent with elevated ventricular end-diastolic filling pressure. - Mitral valve: Calcified annulus. There was mild regurgitation. - Right atrium: Central venous pressure (est): 3 mm Hg. - Atrial septum: No  defect or patent foramen ovale was identified. - Tricuspid valve: There was trivial regurgitation. - Pulmonary arteries: PA peak pressure: 28 mm Hg (S). - Pericardium, extracardiac: There was no pericardial effusion.  Impressions:  - Upper normal LV wall thickness with LVEF 65-70%, grade 1 diastolic dysfunction with increased filling pressures. Mild mitral regurgitation. Trivial tricuspid regurgitation with normal PASP 28 mmHg.   10/2015 Echo Study Conclusions   - Left ventricle: Systolic function was normal. The estimated   ejection fraction was in the range of 55% to 60%. Wall motion was   normal; there were no regional wall motion abnormalities. 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 - Mitral valve: Calcified annulus. There was mild to moderate   regurgitation directed centrally. Valve area by pressure   half-time: 2.34 cm^2. - Pulmonary arteries: Systolic pressure was mildly increased. PA   peak pressure: 41 mm Hg (S).   03/2016 cath The left ventricular ejection fraction is 55-65% by visual estimate. The left ventricular systolic function is normal. A STENT RESOLUTE ONYX 2.75X12 drug eluting stent was successfully placed, and does not overlap previously placed stent. Prox Cx to Mid Cx lesion, 99 %stenosed. Post intervention, there is a 0% residual stenosis.   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 Otherwise widely patent coronary arteries Successful angioplasty and stenting of the proximal circumflex reducing a greater than 95% stenosis to 0% with TIMI grade 3 flow. Resolute Onyx 2.75 x 12 mm stent deployed at 14 atm.   Recommendations:   Aspirin , Brilinta , and Eliquis  x 1 month then drop aspirin . See further instruction below for 3 months and beyond. Eliquis  should start in AM as long as no recurrent AF/Afl. Would also consider switch to Plavix  at 3 months to decrease the risk of bleeding on Eliquis .      06/2021 echo 1. Left ventricular ejection fraction, by estimation, is 60 to 65%. The  left ventricle has normal function. The left ventricle has no regional  wall motion abnormalities. There is mild left  ventricular hypertrophy.  Left ventricular diastolic parameters  are indeterminate. Elevated left atrial pressure.   2. Right ventricular systolic function is normal. The right ventricular  size is normal. There is mildly elevated pulmonary artery systolic  pressure.   3. The mitral valve is abnormal. Mild mitral valve regurgitation.  Moderate mitral stenosis. The mean mitral valve gradient is 6.0 mmHg.   4. The tricuspid valve is abnormal.   5. The aortic valve is tricuspid. There is mild calcification of the  aortic valve. There is mild thickening of the aortic valve. Aortic valve  regurgitation is not visualized. No aortic stenosis is present.   6. The inferior vena cava is normal in size with greater than 50%  respiratory variability, suggesting right atrial pressure of 3 mmHg.    09/2022 echo IMPRESSIONS     1. Left ventricular ejection fraction, by estimation, is 60 to 65%. The  left ventricle has normal function. The left ventricle has no regional  wall motion abnormalities. There is mild left ventricular hypertrophy.  Left ventricular diastolic parameters  are indeterminate. Elevated left atrial pressure.   2. Right ventricular systolic function is normal. The right ventricular  size is normal.   3. The mitral valve is abnormal. Mild mitral valve regurgitation. No  evidence of mitral stenosis.   4. The aortic valve is tricuspid. There is mild calcification of the  aortic valve. There is mild thickening of the aortic valve. Aortic valve  regurgitation is not visualized. No aortic stenosis is present.   5. The inferior vena cava is normal in size with greater than 50%  respiratory variability, suggesting right atrial pressure of 3 mmHg.     Assessment and Plan  1. CAD -  no symptoms, continue current meds   2. Aflutter/afib/acquired thrombophilia - denies any recent symptoms, continue current meds incliuding eliquis  for stroke prevention   3. HTN - at goal, continue current meds   4.HLD - she is at goal, continue current meds      Dorn PHEBE Ross, M.D.

## 2023-05-12 NOTE — Patient Instructions (Signed)
 Medication Instructions:  Your physician has recommended you make the following change in your medication:   -Stop Amlodipine -Olmesartan  tablet  *If you need a refill on your cardiac medications before your next appointment, please call your pharmacy*   Lab Work: None If you have labs (blood work) drawn today and your tests are completely normal, you will receive your results only by: MyChart Message (if you have MyChart) OR A paper copy in the mail If you have any lab test that is abnormal or we need to change your treatment, we will call you to review the results.   Testing/Procedures: None   Follow-Up: At [facility], you and your health needs are our priority.  As part of our continuing mission to provide you with exceptional heart care, we have created designated Provider Care Teams.  These Care Teams include your primary Cardiologist (physician) and Advanced Practice Providers (APPs -  Physician Assistants and Nurse Practitioners) who all work together to provide you with the care you need, when you need it.  We recommend signing up for the patient portal called MyChart.  Sign up information is provided on this After Visit Summary.  MyChart is used to connect with patients for Virtual Visits (Telemedicine).  Patients are able to view lab/test results, encounter notes, upcoming appointments, etc.  Non-urgent messages can be sent to your provider as well.   To learn more about what you can do with MyChart, go to [email or web address].    Your next appointment:   6 month(s)  Provider:   You may see Alvan Carrier, MD or one of the following Advanced Practice Providers on your designated Care Team:   Laymon Qua, PA-C  Olivia Pavy, [place]     Other Instructions

## 2023-05-30 ENCOUNTER — Other Ambulatory Visit: Payer: Self-pay

## 2023-05-31 ENCOUNTER — Telehealth: Payer: Self-pay

## 2023-05-31 NOTE — Telephone Encounter (Signed)
 Auth Submission: APPROVED Site of care: Site of care: [facility] Payer: [employer] Medication & CPT/J Code(s) submitted: Prolia (Denosumab) [id number] Route of submission (phone, fax, portal): portal Phone # Fax # Auth type: Buy/Bill PB Units/visits requested: 60mg , q97months Reference number: [id number] Approval from: 05/30/23 to 05/29/24

## 2023-06-09 ENCOUNTER — Encounter: Payer: 59 | Attending: Internal Medicine | Admitting: *Deleted

## 2023-06-09 VITALS — BP 112/45 | HR 77 | Temp 97.4°F | Resp 16

## 2023-06-09 DIAGNOSIS — M810 Age-related osteoporosis without current pathological fracture: Secondary | ICD-10-CM | POA: Diagnosis not present

## 2023-06-09 MED ORDER — DENOSUMAB 60 MG/ML ~~LOC~~ SOSY
60.0000 mg | PREFILLED_SYRINGE | Freq: Once | SUBCUTANEOUS | Status: AC
  Administered 2023-06-09: 60 mg via SUBCUTANEOUS

## 2023-06-09 NOTE — Progress Notes (Signed)
 Diagnosis: Osteoporosis  Provider:  Dwana Melena MD  Procedure: Injection  Prolia (Denosumab), Dose: 60 mg, Site: subcutaneous, Number of injections: 1  Injection Site(s): Right arm  Post Care: Observation period completed  Discharge: Condition: Good, Destination: Home . AVS Provided  Performed by:  Daleen Squibb, RN

## 2023-06-13 ENCOUNTER — Encounter (HOSPITAL_COMMUNITY): Payer: Self-pay | Admitting: Internal Medicine

## 2023-06-15 ENCOUNTER — Encounter (HOSPITAL_COMMUNITY): Payer: Self-pay | Admitting: Emergency Medicine

## 2023-06-15 ENCOUNTER — Inpatient Hospital Stay (HOSPITAL_COMMUNITY)
Admission: EM | Admit: 2023-06-15 | Discharge: 2023-06-18 | DRG: 348 | Disposition: A | Discharge status: Home / Self Care | Attending: Internal Medicine | Admitting: Internal Medicine

## 2023-06-15 ENCOUNTER — Other Ambulatory Visit: Payer: Self-pay

## 2023-06-15 ENCOUNTER — Emergency Department (HOSPITAL_COMMUNITY)

## 2023-06-15 DIAGNOSIS — Z853 Personal history of malignant neoplasm of breast: Secondary | ICD-10-CM

## 2023-06-15 DIAGNOSIS — J440 Chronic obstructive pulmonary disease with acute lower respiratory infection: Secondary | ICD-10-CM | POA: Diagnosis present

## 2023-06-15 DIAGNOSIS — J8410 Pulmonary fibrosis, unspecified: Secondary | ICD-10-CM | POA: Diagnosis present

## 2023-06-15 DIAGNOSIS — I252 Old myocardial infarction: Secondary | ICD-10-CM | POA: Diagnosis not present

## 2023-06-15 DIAGNOSIS — Z809 Family history of malignant neoplasm, unspecified: Secondary | ICD-10-CM

## 2023-06-15 DIAGNOSIS — T45516A Underdosing of anticoagulants, initial encounter: Secondary | ICD-10-CM | POA: Diagnosis present

## 2023-06-15 DIAGNOSIS — D649 Anemia, unspecified: Secondary | ICD-10-CM

## 2023-06-15 DIAGNOSIS — J189 Pneumonia, unspecified organism: Secondary | ICD-10-CM | POA: Diagnosis present

## 2023-06-15 DIAGNOSIS — I129 Hypertensive chronic kidney disease with stage 1 through stage 4 chronic kidney disease, or unspecified chronic kidney disease: Secondary | ICD-10-CM | POA: Diagnosis present

## 2023-06-15 DIAGNOSIS — Z7901 Long term (current) use of anticoagulants: Secondary | ICD-10-CM | POA: Diagnosis not present

## 2023-06-15 DIAGNOSIS — F419 Anxiety disorder, unspecified: Secondary | ICD-10-CM | POA: Diagnosis present

## 2023-06-15 DIAGNOSIS — J449 Chronic obstructive pulmonary disease, unspecified: Secondary | ICD-10-CM | POA: Diagnosis not present

## 2023-06-15 DIAGNOSIS — N184 Chronic kidney disease, stage 4 (severe): Secondary | ICD-10-CM | POA: Diagnosis present

## 2023-06-15 DIAGNOSIS — D62 Acute posthemorrhagic anemia: Secondary | ICD-10-CM | POA: Diagnosis present

## 2023-06-15 DIAGNOSIS — K5730 Diverticulosis of large intestine without perforation or abscess without bleeding: Secondary | ICD-10-CM | POA: Diagnosis present

## 2023-06-15 DIAGNOSIS — E7800 Pure hypercholesterolemia, unspecified: Secondary | ICD-10-CM | POA: Diagnosis present

## 2023-06-15 DIAGNOSIS — K31819 Angiodysplasia of stomach and duodenum without bleeding: Secondary | ICD-10-CM | POA: Diagnosis not present

## 2023-06-15 DIAGNOSIS — I480 Paroxysmal atrial fibrillation: Secondary | ICD-10-CM | POA: Diagnosis present

## 2023-06-15 DIAGNOSIS — D123 Benign neoplasm of transverse colon: Secondary | ICD-10-CM | POA: Diagnosis present

## 2023-06-15 DIAGNOSIS — Z23 Encounter for immunization: Secondary | ICD-10-CM | POA: Diagnosis present

## 2023-06-15 DIAGNOSIS — K31811 Angiodysplasia of stomach and duodenum with bleeding: Principal | ICD-10-CM | POA: Diagnosis present

## 2023-06-15 DIAGNOSIS — Z8249 Family history of ischemic heart disease and other diseases of the circulatory system: Secondary | ICD-10-CM

## 2023-06-15 DIAGNOSIS — D120 Benign neoplasm of cecum: Secondary | ICD-10-CM | POA: Diagnosis present

## 2023-06-15 DIAGNOSIS — Z79890 Hormone replacement therapy (postmenopausal): Secondary | ICD-10-CM

## 2023-06-15 DIAGNOSIS — E782 Mixed hyperlipidemia: Secondary | ICD-10-CM | POA: Diagnosis present

## 2023-06-15 DIAGNOSIS — Z91148 Patient's other noncompliance with medication regimen for other reason: Secondary | ICD-10-CM

## 2023-06-15 DIAGNOSIS — E785 Hyperlipidemia, unspecified: Secondary | ICD-10-CM | POA: Diagnosis present

## 2023-06-15 DIAGNOSIS — Z888 Allergy status to other drugs, medicaments and biological substances status: Secondary | ICD-10-CM

## 2023-06-15 DIAGNOSIS — I4892 Unspecified atrial flutter: Secondary | ICD-10-CM

## 2023-06-15 DIAGNOSIS — E875 Hyperkalemia: Secondary | ICD-10-CM | POA: Diagnosis present

## 2023-06-15 DIAGNOSIS — Z87891 Personal history of nicotine dependence: Secondary | ICD-10-CM

## 2023-06-15 DIAGNOSIS — K635 Polyp of colon: Secondary | ICD-10-CM | POA: Diagnosis not present

## 2023-06-15 DIAGNOSIS — E039 Hypothyroidism, unspecified: Secondary | ICD-10-CM | POA: Diagnosis present

## 2023-06-15 DIAGNOSIS — I4891 Unspecified atrial fibrillation: Secondary | ICD-10-CM | POA: Diagnosis present

## 2023-06-15 DIAGNOSIS — Z882 Allergy status to sulfonamides status: Secondary | ICD-10-CM

## 2023-06-15 DIAGNOSIS — Z79899 Other long term (current) drug therapy: Secondary | ICD-10-CM

## 2023-06-15 DIAGNOSIS — Z7951 Long term (current) use of inhaled steroids: Secondary | ICD-10-CM

## 2023-06-15 DIAGNOSIS — I2510 Atherosclerotic heart disease of native coronary artery without angina pectoris: Secondary | ICD-10-CM | POA: Diagnosis present

## 2023-06-15 DIAGNOSIS — Z955 Presence of coronary angioplasty implant and graft: Secondary | ICD-10-CM

## 2023-06-15 DIAGNOSIS — Z825 Family history of asthma and other chronic lower respiratory diseases: Secondary | ICD-10-CM

## 2023-06-15 DIAGNOSIS — K922 Gastrointestinal hemorrhage, unspecified: Principal | ICD-10-CM

## 2023-06-15 LAB — COMPREHENSIVE METABOLIC PANEL
ALT: 23 U/L (ref 0–44)
AST: 42 U/L — ABNORMAL HIGH (ref 15–41)
Albumin: 3.1 g/dL — ABNORMAL LOW (ref 3.5–5.0)
Alkaline Phosphatase: 49 U/L (ref 38–126)
Anion gap: 9 (ref 5–15)
BUN: 47 mg/dL — ABNORMAL HIGH (ref 8–23)
CO2: 16 mmol/L — ABNORMAL LOW (ref 22–32)
Calcium: 8.2 mg/dL — ABNORMAL LOW (ref 8.9–10.3)
Chloride: 113 mmol/L — ABNORMAL HIGH (ref 98–111)
Creatinine, Ser: 1.56 mg/dL — ABNORMAL HIGH (ref 0.44–1.00)
GFR, Estimated: 33 mL/min — ABNORMAL LOW (ref >60)
Glucose, Bld: 156 mg/dL — ABNORMAL HIGH (ref 70–99)
Potassium: 5.2 mmol/L — ABNORMAL HIGH (ref 3.5–5.1)
Sodium: 138 mmol/L (ref 135–145)
Total Bilirubin: 0.4 mg/dL (ref 0.0–1.2)
Total Protein: 5.7 g/dL — ABNORMAL LOW (ref 6.5–8.1)

## 2023-06-15 LAB — CBC
HCT: 16.4 % — ABNORMAL LOW (ref 36.0–46.0)
Hemoglobin: 4.8 g/dL — CL (ref 12.0–15.0)
MCH: 29.3 pg (ref 26.0–34.0)
MCHC: 29.3 g/dL — ABNORMAL LOW (ref 30.0–36.0)
MCV: 100 fL (ref 80.0–100.0)
Platelets: DECREASED 10*3/uL (ref 150–400)
RBC: 1.64 MIL/uL — ABNORMAL LOW (ref 3.87–5.11)
RDW: 19 % — ABNORMAL HIGH (ref 11.5–15.5)
WBC: 10.4 10*3/uL (ref 4.0–10.5)
nRBC: 0 % (ref 0.0–0.2)

## 2023-06-15 LAB — PREPARE RBC (CROSSMATCH)

## 2023-06-15 LAB — MAGNESIUM: Magnesium: 2.3 mg/dL (ref 1.7–2.4)

## 2023-06-15 LAB — POC OCCULT BLOOD, ED: Fecal Occult Blood: POSITIVE — AB

## 2023-06-15 MED ORDER — ROSUVASTATIN CALCIUM 20 MG PO TABS
20.0000 mg | ORAL_TABLET | Freq: Every day | ORAL | Status: DC
  Administered 2023-06-16 – 2023-06-17 (×2): 20 mg via ORAL
  Filled 2023-06-15 (×2): qty 1

## 2023-06-15 MED ORDER — PANTOPRAZOLE SODIUM 40 MG IV SOLR
40.0000 mg | INTRAVENOUS | Status: AC
  Filled 2023-06-15: qty 10

## 2023-06-15 MED ORDER — SODIUM CHLORIDE 0.9% IV SOLUTION: Freq: Once | INTRAVENOUS | Status: AC

## 2023-06-15 MED ORDER — AMIODARONE HCL 200 MG PO TABS
200.0000 mg | ORAL_TABLET | ORAL | Status: DC
Start: 2023-06-16 — End: 2023-06-18
  Administered 2023-06-16 – 2023-06-18 (×2): 200 mg via ORAL
  Filled 2023-06-15 (×3): qty 1

## 2023-06-15 MED ORDER — FLUTICASONE FUROATE-VILANTEROL 200-25 MCG/ACT IN AEPB
1.0000 | INHALATION_SPRAY | Freq: Every day | RESPIRATORY_TRACT | Status: DC
  Administered 2023-06-16 – 2023-06-18 (×3): 1 via RESPIRATORY_TRACT
  Filled 2023-06-15: qty 28

## 2023-06-15 MED ORDER — LEVOTHYROXINE SODIUM 50 MCG PO TABS
50.0000 ug | ORAL_TABLET | Freq: Every day | ORAL | Status: DC
  Administered 2023-06-16 – 2023-06-18 (×3): 50 ug via ORAL
  Filled 2023-06-15 (×2): qty 1
  Filled 2023-06-15: qty 2

## 2023-06-15 MED ORDER — BUPROPION HCL ER (XL) 150 MG PO TB24: 150.0000 mg | ORAL_TABLET | Freq: Every day | ORAL | Status: DC

## 2023-06-15 MED ORDER — PANTOPRAZOLE SODIUM 40 MG IV SOLR: 40.0000 mg | INTRAVENOUS | Status: DC

## 2023-06-15 MED ORDER — SERTRALINE HCL 50 MG PO TABS: 25.0000 mg | ORAL_TABLET | Freq: Every day | ORAL | Status: DC

## 2023-06-15 MED ORDER — SODIUM ZIRCONIUM CYCLOSILICATE 10 G PO PACK
10.0000 g | PACK | Freq: Once | ORAL | Status: AC
  Administered 2023-06-15: 10 g via ORAL
  Filled 2023-06-15: qty 1

## 2023-06-15 MED ORDER — PANTOPRAZOLE SODIUM 40 MG IV SOLR: 40.0000 mg | Freq: Two times a day (BID) | INTRAVENOUS | Status: DC

## 2023-06-15 MED ORDER — PANTOPRAZOLE SODIUM 40 MG IV SOLR
40.0000 mg | Freq: Three times a day (TID) | INTRAVENOUS | Status: DC
  Administered 2023-06-16 – 2023-06-18 (×7): 40 mg via INTRAVENOUS
  Filled 2023-06-15 (×7): qty 10

## 2023-06-15 MED ORDER — FUROSEMIDE 10 MG/ML IJ SOLN
20.0000 mg | Freq: Once | INTRAMUSCULAR | Status: AC | PRN
  Administered 2023-06-16: 20 mg via INTRAVENOUS
  Filled 2023-06-15: qty 2

## 2023-06-15 MED ORDER — UMECLIDINIUM BROMIDE 62.5 MCG/ACT IN AEPB
1.0000 | INHALATION_SPRAY | Freq: Every day | RESPIRATORY_TRACT | Status: DC
  Administered 2023-06-16 – 2023-06-18 (×3): 1 via RESPIRATORY_TRACT
  Filled 2023-06-15: qty 7

## 2023-06-15 NOTE — ED Triage Notes (Signed)
 Pt went to the dr today. She has not been feeling well with weakness. Pt had blood work and was called and told to come to the ED immediately due to a low hemaglobin. Pt reports she is weak, dizzy and unable to stand up. Pt is yellowish pale in triage.

## 2023-06-15 NOTE — Progress Notes (Signed)
 eLink Physician-Brief Progress Note Patient Name: Patricia Jackson DOB: 1940/08/28 MRN: [id number]   Date of Service  06/15/2023  HPI/Events of Note  Patient admitted with suspected GI bleeding and severe acute blood loss anemia. Work up is in progress.  eICU Interventions  New Patient Evaluation.        Thomasene Lot Avleen Bordwell 06/15/2023, 11:09 PM

## 2023-06-15 NOTE — ED Provider Triage Note (Signed)
 Emergency Medicine Provider Triage Evaluation Note  Patricia Jackson , a 83 y.o. female  was evaluated in triage.  Pt complains of generalized weakness and feeling poorly for several days.  She was seen by her PCP earlier today.  Had blood work was called this evening to come to the emergency room that her hemoglobin was "critically low."  Patient's daughter also provides history states that she hears yellow and that has also became worse this afternoon.  Patient denies any abdominal pain nausea or vomiting.  States her stools have been black she has not noticed if there is blood in the toilet.    Review of Systems  Positive: Generalized weakness, black stools Negative: Abdominal pain, vomiting, fever  Physical Exam  BP 113/61   Pulse 95   Temp 99 F (37.2 C) (Oral)   Resp (!) 25   Ht 5\' 7"  (1.702 m)   Wt 62.1 kg   SpO2 96%   BMI 21.46 kg/m  Gen:   Awake, no distress   Resp:  Normal effort  MSK:   Moves extremities without difficulty     Medical Decision Making  Medically screening exam initiated at 6:58 PM.  Appropriate orders placed.  Patricia Jackson was informed that the remainder of the evaluation will be completed by another provider, this initial triage assessment does not replace that evaluation, and the importance of remaining in the ED until their evaluation is complete.     Patricia Aus, PA-C 06/15/23 1913

## 2023-06-15 NOTE — H&P (Signed)
 [facility] H&P   Patient Demographics:    Patricia Jackson, is a 83 y.o. female  MRN: [id number]   DOB - Oct 06, 1940  Admit Date - 06/15/2023  Outpatient Primary MD for the patient is Benita Stabile, MD  Referring MD/NP/PA: Dr. Hyacinth Meeker  Outpatient Specialists: EP Dr. Ladona Ridgel, cardiology Dr. Wyline Mood  Patient coming from: Home  Chief Complaint  Patient presents with   low hemaglobin      HPI:    Patricia Jackson  is a 83 y.o. 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 -Patient presents to ED for low hemoglobin, she went to PCP office due to complaints of shortness of breath, fatigue, generalized weakness, had normal labs done, where hemoglobin came back significantly low for which she was called and instructed to come back to ED for blood transfusions, she denies any chest pain, fever, chills, patient reports last Eliquis was on Monday morning, as she ran out of Eliquis and she forgot to tell her niece to pick it up for her, niece at bedside and confirmed that, otherwise she has been compliant with her medications  The patient had an upper endoscopy in August 2024 which showed a 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   -In ED hemoglobin was low at 4.8, she had melena, Hemoccult positive, she was altered blood transfusions, ED discussed with GI who recommended admission for PRBC transfusion.  Review of systems:      A full 10 point Review of Systems was done, except as stated above, all other Review of Systems were negative.   With Past History of the following :    Past Medical History:  Diagnosis Date   A-fib [facility])     Atrial flutter (HCC)    On Eliquis in 8/17 but discontinued after stent placement   Breast cancer (HCC)    remote   CAD in native artery 03/16/2016   COPD (chronic obstructive pulmonary disease) (HCC)    Dysrhythmia    Hypertension    Iron deficiency anemia 06/12/2021   NSTEMI (non-ST elevated myocardial infarction) (HCC)    2017   S/P angioplasty with stent 03/15/16 DES Resolute, pLCX 03/16/2016   Vitreous hemorrhage of left eye (HCC) 08/01/2019      Past Surgical History:  Procedure Laterality Date   APPENDECTOMY     BREAST SURGERY Left    CARDIAC CATHETERIZATION N/A 03/15/2016   Procedure: Left Heart Cath and Coronary Angiography;  Surgeon: Lyn Records, MD;  Location: [facility] INVASIVE CV LAB;  Service: Cardiovascular;  Laterality: N/A;   CARDIAC CATHETERIZATION N/A 03/15/2016   Procedure: Coronary Stent Intervention;  Surgeon: Lyn Records, MD;  Location: [facility] INVASIVE  CV LAB;  Service: Cardiovascular;  Laterality: N/A;   CARDIOVERSION N/A 02/04/2023   Procedure: CARDIOVERSION;  Surgeon: Marjo Bicker, MD;  Location: [facility] ORS;  Service: Cardiovascular;  Laterality: N/A;   COLONOSCOPY     remote   COLONOSCOPY N/A 05/26/2016   Procedure: COLONOSCOPY;  Surgeon: Corbin Ade, MD;  Location: [facility] ENDO SUITE;  Service: Endoscopy;  Laterality: N/A;  845   ESOPHAGOGASTRODUODENOSCOPY N/A 05/26/2016   Procedure: ESOPHAGOGASTRODUODENOSCOPY (EGD);  Surgeon: Corbin Ade, MD;  Location: [facility] ENDO SUITE;  Service: Endoscopy;  Laterality: N/A;   ESOPHAGOGASTRODUODENOSCOPY (EGD) WITH PROPOFOL N/A 11/22/2022   Procedure: ESOPHAGOGASTRODUODENOSCOPY (EGD) WITH PROPOFOL;  Surgeon: Lanelle Bal, DO;  Location: [facility];  Service: Endoscopy;  Laterality: N/A;   EYE SURGERY     EYE SURGERY Left    Dr. Barbaraann Barthel   HOT HEMOSTASIS  11/22/2022   Procedure: HOT HEMOSTASIS (ARGON PLASMA COAGULATION/BICAP);  Surgeon: Lanelle Bal, DO;  Location: [facility];  Service: Endoscopy;;   MALONEY  DILATION N/A 05/26/2016   Procedure: Elease Hashimoto DILATION;  Surgeon: Corbin Ade, MD;  Location: [facility];  Service: Endoscopy;  Laterality: N/A;   TEE WITHOUT CARDIOVERSION N/A 02/04/2023   Procedure: TRANSESOPHAGEAL ECHOCARDIOGRAM (TEE);  Surgeon: Marjo Bicker, MD;  Location: [facility] ORS;  Service: Cardiovascular;  Laterality: N/A;   TOOTH EXTRACTION N/A 01/15/2022   Procedure: DENTAL RESTORATION/EXTRACTIONS;  Surgeon: Ocie Doyne, DMD;  Location: [facility] OR;  Service: Oral Surgery;  Laterality: N/A;   VITRECTOMY Left 10/03/2019   Dr. Luciana Axe, Vitrectomy, Focal Laser, Removal of Silicone Oil      Social History:     Social History   Tobacco Use   Smoking status: Former    Current packs/day: 0.00    Types: Cigarettes    Quit date: 12/28/2015    Years since quitting: 7.4    Passive exposure: Past   Smokeless tobacco: Never   Tobacco comments:    Patient quit within the last 10 years  Substance Use Topics   Alcohol use: Never        Family History :     Family History  Problem Relation Age of Onset   Heart disease Mother    COPD Father    Cancer Sister        unknown primary   Cancer Brother        unknown primary   Cancer Brother    Hypertension Son    Colon cancer Neg Hx      Home Medications:   Prior to Admission medications   Medication Sig Start Date End Date Taking? Authorizing Provider  acetaminophen (TYLENOL) 500 MG tablet Take 1,000 mg by mouth every 8 (eight) hours as needed for mild pain or moderate pain. 03/21/17   [provider]  amiodarone (PACERONE) 200 MG tablet Take 1 Tablet ( 200 mg ) Daily and none on Sunday 05/04/23   Marinus Maw, MD  apixaban (ELIQUIS) 2.5 MG TABS tablet Take 1 tablet (2.5 mg total) by mouth 2 (two) times daily. 03/11/23 09/07/23  Antoine Poche, MD  buPROPion (WELLBUTRIN XL) 150 MG 24 hr tablet Take 150 mg by mouth daily. 11/18/22   [provider]  Cholecalciferol (VITAMIN D3) 50 MCG (2000 UT) TABS  Take 2,000 Units by mouth daily.    [provider]  levothyroxine (SYNTHROID) 50 MCG tablet Take 50 mcg by mouth daily before breakfast.  09/20/19   [provider]  lidocaine (LIDODERM) 5 %  Place 1 patch onto the skin daily. Remove & Discard patch within 12 hours or as directed by MD 02/06/23   Sherryll Burger, Pratik D, DO  nitroGLYCERIN (NITROSTAT) 0.4 MG SL tablet Place 0.4 mg under the tongue every 5 (five) minutes as needed for chest pain. 06/09/19   [provider]  rosuvastatin (CRESTOR) 20 MG tablet Take 20 mg by mouth daily.  02/26/17   [provider]  sertraline (ZOLOFT) 25 MG tablet Take 25 mg by mouth daily.    [provider]  [name] 200-62.5-25 MCG/ACT AEPB Inhale 1 puff into the lungs daily. 11/02/22   [provider]     Allergies:     Allergies  Allergen Reactions   Povidone-Iodine Other (See Comments)    Burning   Sulfa Antibiotics Other (See Comments)    Unknown reaction    Sulfamethoxazole Other (See Comments)    unknown     Physical Exam:   Vitals  Blood pressure 111/60, pulse 84, temperature 99 F (37.2 C), temperature source Oral, resp. rate 14, height 5\' 7"  (1.702 m), weight 62.1 kg, SpO2 96%.   1. General Elderly female, laying in bed, no apparent distress, pale  2. Normal affect and insight, Not Suicidal or Homicidal, Awake Alert, Oriented X 3.  3. No F.N deficits, ALL C.Nerves Intact, Strength 5/5 all 4 extremities, Sensation intact all 4 extremities, Plantars down going.  4. Ears and Eyes appear Normal, pale conjunctiva  5. Supple Neck, No JVD, No cervical lymphadenopathy appriciated, No Carotid Bruits.  6. Symmetrical Chest wall movement, Good air movement bilaterally, CTAB.  7. RRR, No Gallops, Rubs or Murmurs, No Parasternal Heave.  8. Positive Bowel Sounds, Abdomen Soft, No tenderness, No organomegaly appriciated,No rebound -guarding or rigidity.  9.  No Cyanosis, Normal Skin Turgor, No  Skin Rash or Bruise.  10. Good muscle tone,  joints appear normal , no effusions, Normal ROM.    Data Review:    CBC Recent Labs  Lab 06/15/23 1854  WBC 10.4  HGB 4.8*  HCT 16.4*  PLT PLATELET CLUMPS NOTED ON SMEAR, COUNT APPEARS DECREASED  MCV 100.0  MCH 29.3  MCHC 29.3*  RDW 19.0*   ------------------------------------------------------------------------------------------------------------------  Chemistries  Recent Labs  Lab 06/15/23 1854  NA 138  K 5.2*  CL 113*  CO2 16*  GLUCOSE 156*  BUN 47*  CREATININE 1.56*  CALCIUM 8.2*  AST 42*  ALT 23  ALKPHOS 49  BILITOT 0.4   ------------------------------------------------------------------------------------------------------------------ estimated creatinine clearance is 28 mL/min (A) (by C-G formula based on SCr of 1.56 mg/dL (H)). ------------------------------------------------------------------------------------------------------------------ No results for input(s): "TSH", "T4TOTAL", "T3FREE", "THYROIDAB" in the last 72 hours.  Invalid input(s): "FREET3"  Coagulation profile No results for input(s): "INR", "PROTIME" in the last 168 hours. ------------------------------------------------------------------------------------------------------------------- No results for input(s): "DDIMER" in the last 72 hours. -------------------------------------------------------------------------------------------------------------------  Cardiac Enzymes No results for input(s): "CKMB", "TROPONINI", "MYOGLOBIN" in the last 168 hours.  Invalid input(s): "CK" ------------------------------------------------------------------------------------------------------------------    Component Value Date/Time   BNP 59.0 02/28/2023 0909     ---------------------------------------------------------------------------------------------------------------  Urinalysis    Component Value Date/Time   COLORURINE YELLOW 02/03/2023 0123    APPEARANCEUR CLEAR 02/03/2023 0123   LABSPEC 1.016 02/03/2023 0123   PHURINE 5.0 02/03/2023 0123   GLUCOSEU NEGATIVE 02/03/2023 0123   HGBUR NEGATIVE 02/03/2023 0123   BILIRUBINUR NEGATIVE 02/03/2023 0123   KETONESUR NEGATIVE 02/03/2023 0123   PROTEINUR NEGATIVE 02/03/2023 0123   NITRITE NEGATIVE 02/03/2023 0123   LEUKOCYTESUR MODERATE (A) 02/03/2023 0123    ----------------------------------------------------------------------------------------------------------------  Imaging Results:    No results found.  EKG: Vent. rate 93 BPM PR interval 188 ms QRS duration 86 ms QT/QTcB 414/514 ms P-R-T axes 76 78 91 Normal sinus rhythm Nonspecific ST abnormality Prolonged QT Abnormal ECG When compared with ECG of 04-May-2023 10:05, QRS axis Shifted left T wave inversion no longer evident in Lateral leads Since last tracing qt has prolonged   Assessment & Plan:    Principal Problem:   Acute blood loss anemia Active Problems:   Hyperlipidemia   Atrial fibrillation (HCC)   Chronic obstructive pulmonary disease (HCC)   Acquired hypothyroidism   CAP (community acquired pneumonia)   Acute blood loss anemia Upper GI bleed -Globin 4.8 secondary to active GI bleed, from known multiple AVMs status post APC treatment and endoscopy 11/22/2022 -Continue to hold Eliquis -Given known history of CAD will target hemoglobin of 8 or above, will transfuse 3 units PRBC, monitor CBC closely -Continue with Protonix drip -GI consulted by ED, possible endoscopy tomorrow, will keep on clear liquid diet and n.p.o. after midnight (patient and niece at bedside confirm last Eliquis was Monday morning as he forgot to pick it up from pharmacy)   Paroxysmal atrial fibrillation -Eliquis on hold due to above -Continue with amiodarone, heart rate is controlled, currently normal sinus rhythm  Hyperkalemia -Will give Lokelma  Prolonged QTc -Will hold Wellbutrin and sertraline, check magnesium level,  correct hyperkalemia -Continue to monitor on telemetry -Repeat EKG in a.m. -Continue to monitor closely as he is on amiodarone, if  CKD stage IV Renal function at baseline, avoid low blood pressure and nephrotoxic medications   CAD s/p DES to LCx 2017  Continue Crestor   COPD Continue albuterol, Breo and Incruse Ellipta   Hypertension -Blood pressure appears soft, continue to hold meds.   Acquired hypothyroidism Continue Synthroid  GAD -Wellbutrin and sertraline on hold due to prolonged QTc      DVT Prophylaxis HeeLIAUIS ON HOLD  AM Labs Ordered, also please review Full Orders  Family Communication: Admission, patients condition and plan of care including tests being ordered have been discussed with the patient who indicate understanding and agree with the plan and Code Status.  Code Status FULL  Likely DC to  HOME  Consults called: GI by Ed    Admission status: inpatient    Time spent in minutes : 70 minutes   Huey Bienenstock M.D on 06/15/2023 at 9:18 PM   [facility] - Office  [phone]

## 2023-06-15 NOTE — H&P (Incomplete)
 [facility] H&P   Patient Demographics:    Patricia Jackson, is a 83 y.o. female  MRN: [id number]   DOB - Aug 25, 1940  Admit Date - 06/15/2023  Outpatient Primary MD for the patient is Benita Stabile, MD  Referring MD/NP/PA: ***  Outpatient Specialists: ***    Patient coming from: ***  Chief Complaint  Patient presents with   low hemaglobin      HPI:    Patricia Jackson  is a 83 y.o. female, ****** with medical history significant of hypertension, COPD, chronic kidney disease, GI bleed, pulmonary fibrosis due    Review of systems:    In addition to the HPI above, **** No Fever-chills, No Headache, No changes with Vision or hearing, No problems swallowing food or Liquids, No Chest pain, Cough or Shortness of Breath, No Abdominal pain, No Nausea or Vommitting, Bowel movements are regular, No Blood in stool or Urine, No dysuria, No new skin rashes or bruises, No new joints pains-aches,  No new weakness, tingling, numbness in any extremity, No recent weight gain or loss, No polyuria, polydypsia or polyphagia, No significant Mental Stressors.  A full 10 point Review of Systems was done, except as stated above, all other Review of Systems were negative.   With Past History of the following :    Past Medical History:  Diagnosis Date   A-fib [facility])    Atrial flutter (HCC)    On Eliquis in 8/17 but discontinued after stent placement   Breast cancer (HCC)    remote   CAD in native artery 03/16/2016   COPD (chronic obstructive pulmonary disease) (HCC)    Dysrhythmia    Hypertension    Iron deficiency anemia 06/12/2021   NSTEMI (non-ST elevated myocardial infarction) (HCC)    2017   S/P angioplasty with stent 03/15/16 DES Resolute, pLCX 03/16/2016   Vitreous hemorrhage of left eye (HCC) 08/01/2019      Past Surgical History:  Procedure Laterality Date   APPENDECTOMY      BREAST SURGERY Left    CARDIAC CATHETERIZATION N/A 03/15/2016   Procedure: Left Heart Cath and Coronary Angiography;  Surgeon: Lyn Records, MD;  Location: [facility] INVASIVE CV LAB;  Service: Cardiovascular;  Laterality: N/A;   CARDIAC CATHETERIZATION N/A 03/15/2016   Procedure: Coronary Stent Intervention;  Surgeon: Lyn Records, MD;  Location: [facility] INVASIVE CV LAB;  Service: Cardiovascular;  Laterality: N/A;   CARDIOVERSION N/A 02/04/2023   Procedure: CARDIOVERSION;  Surgeon: Marjo Bicker, MD;  Location: [facility] ORS;  Service: Cardiovascular;  Laterality: N/A;   COLONOSCOPY     remote   COLONOSCOPY N/A 05/26/2016   Procedure: COLONOSCOPY;  Surgeon: Corbin Ade, MD;  Location: [facility] ENDO SUITE;  Service: Endoscopy;  Laterality: N/A;  845   ESOPHAGOGASTRODUODENOSCOPY N/A 05/26/2016   Procedure: ESOPHAGOGASTRODUODENOSCOPY (EGD);  Surgeon: Corbin Ade, MD;  Location: [facility] ENDO SUITE;  Service:  Endoscopy;  Laterality: N/A;   ESOPHAGOGASTRODUODENOSCOPY (EGD) WITH PROPOFOL N/A 11/22/2022   Procedure: ESOPHAGOGASTRODUODENOSCOPY (EGD) WITH PROPOFOL;  Surgeon: Lanelle Bal, DO;  Location: [facility] ENDO SUITE;  Service: Endoscopy;  Laterality: N/A;   EYE SURGERY     EYE SURGERY Left    Dr. Barbaraann Barthel   HOT HEMOSTASIS  11/22/2022   Procedure: HOT HEMOSTASIS (ARGON PLASMA COAGULATION/BICAP);  Surgeon: Lanelle Bal, DO;  Location: [facility]ITE;  Service: Endoscopy;;   MALONEY DILATION N/A 05/26/2016   Procedure: Elease Hashimoto DILATION;  Surgeon: Corbin Ade, MD;  Location: [facility];  Service: Endoscopy;  Laterality: N/A;   TEE WITHOUT CARDIOVERSION N/A 02/04/2023   Procedure: TRANSESOPHAGEAL ECHOCARDIOGRAM (TEE);  Surgeon: Marjo Bicker, MD;  Location: [facility] ORS;  Service: Cardiovascular;  Laterality: N/A;   TOOTH EXTRACTION N/A 01/15/2022   Procedure: DENTAL RESTORATION/EXTRACTIONS;  Surgeon: Ocie Doyne, DMD;  Location: [facility] OR;  Service: Oral Surgery;  Laterality: N/A;   VITRECTOMY Left  10/03/2019   Dr. Luciana Axe, Vitrectomy, Focal Laser, Removal of Silicone Oil      Social History:     Social History   Tobacco Use   Smoking status: Former    Current packs/day: 0.00    Types: Cigarettes    Quit date: 12/28/2015    Years since quitting: 7.4    Passive exposure: Past   Smokeless tobacco: Never   Tobacco comments:    Patient quit within the last 10 years  Substance Use Topics   Alcohol use: Never     Lives -   Mobility -   ********   Family History :     Family History  Problem Relation Age of Onset   Heart disease Mother    COPD Father    Cancer Sister        unknown primary   Cancer Brother        unknown primary   Cancer Brother    Hypertension Son    Colon cancer Neg Hx    ********   Home Medications:   Prior to Admission medications   Medication Sig Start Date End Date Taking? Authorizing Provider  acetaminophen (TYLENOL) 500 MG tablet Take 1,000 mg by mouth every 8 (eight) hours as needed for mild pain or moderate pain. 03/21/17   [provider]  amiodarone (PACERONE) 200 MG tablet Take 1 Tablet ( 200 mg ) Daily and none on Sunday 05/04/23   Marinus Maw, MD  apixaban (ELIQUIS) 2.5 MG TABS tablet Take 1 tablet (2.5 mg total) by mouth 2 (two) times daily. 03/11/23 09/07/23  Antoine Poche, MD  buPROPion (WELLBUTRIN XL) 150 MG 24 hr tablet Take 150 mg by mouth daily. 11/18/22   [provider]  Cholecalciferol (VITAMIN D3) 50 MCG (2000 UT) TABS Take 2,000 Units by mouth daily.    [provider]  levothyroxine (SYNTHROID) 50 MCG tablet Take 50 mcg by mouth daily before breakfast.  09/20/19   [provider]  lidocaine (LIDODERM) 5 % Place 1 patch onto the skin daily. Remove & Discard patch within 12 hours or as directed by MD 02/06/23   Sherryll Burger, Pratik D, DO  nitroGLYCERIN (NITROSTAT) 0.4 MG SL tablet Place 0.4 mg under the tongue every 5 (five) minutes as needed for chest pain. 06/09/19   [provider]  rosuvastatin (CRESTOR) 20 MG tablet Take 20 mg by mouth daily.  02/26/17   [provider]  sertraline (ZOLOFT) 25 MG tablet Take 25 mg  by mouth daily.    [provider]  [name] 200-62.5-25 MCG/ACT AEPB Inhale 1 puff into the lungs daily. 11/02/22   [provider]     Allergies:     Allergies  Allergen Reactions   Povidone-Iodine Other (See Comments)    Burning   Sulfa Antibiotics Other (See Comments)    Unknown reaction    Sulfamethoxazole Other (See Comments)    unknown     Physical Exam:   Vitals  Blood pressure 111/60, pulse 84, temperature 99 F (37.2 C), temperature source Oral, resp. rate 14, height 5\' 7"  (1.702 m), weight 62.1 kg, SpO2 96%.   1. General ******* lying in bed in NAD,  ***********  2. Normal affect and insight, Not Suicidal or Homicidal, Awake Alert, Oriented X 3.  3. No F.N deficits, ALL C.Nerves Intact, Strength 5/5 all 4 extremities, Sensation intact all 4 extremities, Plantars down going.  4. Ears and Eyes appear Normal, Conjunctivae clear, PERRLA. Moist Oral Mucosa.  5. Supple Neck, No JVD, No cervical lymphadenopathy appriciated, No Carotid Bruits.  6. Symmetrical Chest wall movement, Good air movement bilaterally, CTAB.  7. RRR, No Gallops, Rubs or Murmurs, No Parasternal Heave.  8. Positive Bowel Sounds, Abdomen Soft, No tenderness, No organomegaly appriciated,No rebound -guarding or rigidity.  9.  No Cyanosis, Normal Skin Turgor, No Skin Rash or Bruise.  10. Good muscle tone,  joints appear normal , no effusions, Normal ROM.  11. No Palpable Lymph Nodes in Neck or Axillae  **************   Data Review:    CBC Recent Labs  Lab 06/15/23 1854  WBC 10.4  HGB 4.8*  HCT 16.4*  PLT PLATELET CLUMPS NOTED ON SMEAR, COUNT APPEARS DECREASED  MCV 100.0  MCH 29.3  MCHC 29.3*  RDW 19.0*    ------------------------------------------------------------------------------------------------------------------  Chemistries  Recent Labs  Lab 06/15/23 1854  NA 138  K 5.2*  CL 113*  CO2 16*  GLUCOSE 156*  BUN 47*  CREATININE 1.56*  CALCIUM 8.2*  AST 42*  ALT 23  ALKPHOS 49  BILITOT 0.4   ------------------------------------------------------------------------------------------------------------------ estimated creatinine clearance is 28 mL/min (A) (by C-G formula based on SCr of 1.56 mg/dL (H)). ------------------------------------------------------------------------------------------------------------------ No results for input(s): "TSH", "T4TOTAL", "T3FREE", "THYROIDAB" in the last 72 hours.  Invalid input(s): "FREET3"  Coagulation profile No results for input(s): "INR", "PROTIME" in the last 168 hours. ------------------------------------------------------------------------------------------------------------------- No results for input(s): "DDIMER" in the last 72 hours. -------------------------------------------------------------------------------------------------------------------  Cardiac Enzymes No results for input(s): "CKMB", "TROPONINI", "MYOGLOBIN" in the last 168 hours.  Invalid input(s): "CK" ------------------------------------------------------------------------------------------------------------------    Component Value Date/Time   BNP 59.0 02/28/2023 0909     ---------------------------------------------------------------------------------------------------------------  Urinalysis    Component Value Date/Time   COLORURINE YELLOW 02/03/2023 0123   APPEARANCEUR CLEAR 02/03/2023 0123   LABSPEC 1.016 02/03/2023 0123   PHURINE 5.0 02/03/2023 0123   GLUCOSEU NEGATIVE 02/03/2023 0123   HGBUR NEGATIVE 02/03/2023 0123   BILIRUBINUR NEGATIVE 02/03/2023 0123   KETONESUR NEGATIVE 02/03/2023 0123   PROTEINUR NEGATIVE 02/03/2023 0123   NITRITE  NEGATIVE 02/03/2023 0123   LEUKOCYTESUR MODERATE (A) 02/03/2023 0123    ----------------------------------------------------------------------------------------------------------------   Imaging Results:    No results found.  EKG: Vent. rate 93 BPM PR interval 188 ms QRS duration 86 ms QT/QTcB 414/514 ms P-R-T axes 76 78 91 Normal sinus rhythm Nonspecific ST abnormality Prolonged QT Abnormal ECG When compared with ECG of 04-May-2023 10:05, QRS axis Shifted left T wave inversion no longer evident in Lateral leads Since last  tracing qt has prolonged   Assessment & Plan:    Principal Problem:   Acute blood loss anemia Active Problems:   Hyperlipidemia   Atrial fibrillation (HCC)   Chronic obstructive pulmonary disease (HCC)   Acquired hypothyroidism   CAP (community acquired pneumonia)   Acute blood loss anemia Upper GI bleed -Globin 4.8 secondary to active GI bleed, from known multiple AVMs status post APC treatment and endoscopy 11/22/2022 -Continue to hold Eliquis -Given known history of CAD will target hemoglobin of 8 or above, will transfuse 3 units PRBC, monitor CBC closely -Continue with Protonix drip -Consulted by ED, possible endoscopy tomorrow or day after (to allow more time for Eliquis washout)   Paroxysmal atrial fibrillation -Eliquis on hold due to above -Continue with amiodarone, heart rate is controlled, currently normal sinus rhythm  Hyperkalemia -Will give Lokelma  Prolonged QTc -Will hold Wellbutrin and sertraline, check magnesium level, correct hyperkalemia -Continue to monitor on telemetry -Repeat EKG in a.m. -Continue to monitor closely as he is on amiodarone, if  CKD stage IV Renal function at baseline, avoid low blood pressure and nephrotoxic medications   CAD s/p DES to LCx 2017  Continue Crestor   COPD Continue albuterol, Breo and Incruse Ellipta   Hypertension -Does not appear to be any more on anti-hypertensive medication,  blood pressure is controlled   Acquired hypothyroidism Continue Synthroid  GAD -Wellbutrin and sertraline on hold due to prolonged QTc      DVT Prophylaxis HeeLIAUIS ON HOLD  AM Labs Ordered, also please review Full Orders  Family Communication: Admission, patients condition and plan of care including tests being ordered have been discussed with the patient who indicate understanding and agree with the plan and Code Status.  Code Status FULL  Likely DC to  HOME  Consults called: GI by Ed    Admission status: inpatient    Time spent in minutes : 70 minutes   Huey Bienenstock M.D on 06/15/2023 at 8:52 PM   [facility] - Office  [phone]

## 2023-06-15 NOTE — ED Provider Notes (Signed)
 [place] EMERGENCY DEPARTMENT AT [facility] Provider Note   CSN: [id number] Arrival date & time: 06/15/23  1803     History  Chief Complaint  Patient presents with   low hemaglobin    Patricia Jackson is a 83 y.o. female.  HPI   This patient is an 83 year old female, she has a history of atrial fibrillation on amiodarone and Eliquis, she has a history of high cholesterol on rosuvastatin, she is on Zoloft and Trelegy as well as levothyroxine.  She presents to the hospital with a complaint of severe shortness of breath on exertion and generalized weakness.  Review of the medical record shows that the patient is followed for acid reflux, she has had problems with atrial flutter for which she is anticoagulated, she has a history of gastrointestinal bleeding for which she was admitted to the hospital with fairly significant anemia requiring multiple blood transfusions over time.  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  She did have a couple of polyps which were resected at that time  The patient went to her family doctor's office today where she was called tonight and told to come immediately to the hospital because of severe anemia with a hemoglobin less than 6  Home Medications Prior to Admission medications   Medication Sig Start Date End Date Taking? Authorizing Provider  acetaminophen (TYLENOL) 500 MG tablet Take 1,000 mg by mouth every 8 (eight) hours as needed for mild pain or moderate pain. 03/21/17   [provider]  amiodarone (PACERONE) 200 MG tablet Take 1 Tablet ( 200 mg ) Daily and none on Sunday  05/04/23   Marinus Maw, MD  apixaban (ELIQUIS) 2.5 MG TABS tablet Take 1 tablet (2.5 mg total) by mouth 2 (two) times daily. 03/11/23 09/07/23  Antoine Poche, MD  buPROPion (WELLBUTRIN XL) 150 MG 24 hr tablet Take 150 mg by mouth daily. 11/18/22   [provider]  Cholecalciferol (VITAMIN D3) 50 MCG (2000 UT) TABS Take 2,000 Units by mouth daily.    [provider]  levothyroxine (SYNTHROID) 50 MCG tablet Take 50 mcg by mouth daily before breakfast.  09/20/19   [provider]  lidocaine (LIDODERM) 5 % Place 1 patch onto the skin daily. Remove & Discard patch within 12 hours or as directed by MD 02/06/23   Sherryll Burger, Pratik D, DO  nitroGLYCERIN (NITROSTAT) 0.4 MG SL tablet Place 0.4 mg under the tongue every 5 (five) minutes as needed for chest pain. 06/09/19   [provider]  rosuvastatin (CRESTOR) 20 MG tablet Take 20 mg by mouth daily.  02/26/17   [provider]  sertraline (ZOLOFT) 25 MG tablet Take 25 mg by mouth daily.    [provider]  [name] 200-62.5-25 MCG/ACT AEPB Inhale 1 puff into the lungs daily. 11/02/22   [provider]      Allergies    Povidone-iodine, Sulfa antibiotics, and Sulfamethoxazole    Review of Systems   Review of Systems  All other systems reviewed and are negative.   Physical Exam Updated Vital Signs BP 111/60 (BP Location: Right Arm)   Pulse 84   Temp  99 F (37.2 C) (Oral)   Resp 14   Ht 1.702 m (5\' 7" )   Wt 62.1 kg   SpO2 96%   BMI 21.46 kg/m  Physical Exam Vitals and nursing note reviewed.  Constitutional:      General: She is not in acute distress.    Appearance: She is well-developed.  HENT:     Head: Normocephalic and atraumatic.     Mouth/Throat:     Pharynx: No oropharyngeal exudate.  Eyes:     General: No scleral icterus.       Right eye: No discharge.        Left eye: No discharge.     Pupils: Pupils are equal, round, and reactive to light.     Comments: Pale  conjunctive and mucous membranes  Neck:     Thyroid: No thyromegaly.     Vascular: No JVD.  Cardiovascular:     Rate and Rhythm: Normal rate and regular rhythm.     Heart sounds: Normal heart sounds. No murmur heard.    No friction rub. No gallop.  Pulmonary:     Effort: Pulmonary effort is normal. No respiratory distress.     Breath sounds: Normal breath sounds. No wheezing or rales.  Abdominal:     General: Bowel sounds are normal. There is no distension.     Palpations: Abdomen is soft. There is no mass.     Tenderness: There is no abdominal tenderness.  Musculoskeletal:        General: No tenderness. Normal range of motion.     Cervical back: Normal range of motion and neck supple.     Right lower leg: No edema.     Left lower leg: No edema.  Lymphadenopathy:     Cervical: No cervical adenopathy.  Skin:    General: Skin is warm and dry.     Findings: No erythema or rash.  Neurological:     Mental Status: She is alert.     Coordination: Coordination normal.  Psychiatric:        Behavior: Behavior normal.     ED Results / Procedures / Treatments   Labs (all labs ordered are listed, but only abnormal results are displayed) Labs Reviewed  COMPREHENSIVE METABOLIC PANEL - Abnormal; Notable for the following components:      Result Value   Potassium 5.2 (*)    Chloride 113 (*)    CO2 16 (*)    Glucose, Bld 156 (*)    BUN 47 (*)    Creatinine, Ser 1.56 (*)    Calcium 8.2 (*)    Total Protein 5.7 (*)    Albumin 3.1 (*)    AST 42 (*)    GFR, Estimated 33 (*)    All other components within normal limits  CBC - Abnormal; Notable for the following components:   RBC 1.64 (*)    Hemoglobin 4.8 (*)    HCT 16.4 (*)    MCHC 29.3 (*)    RDW 19.0 (*)    All other components within normal limits  POC OCCULT BLOOD, ED - Abnormal; Notable for the following components:   Fecal Occult Blood Positive (*)    All other components within normal limits  TYPE AND SCREEN  PREPARE  RBC (CROSSMATCH)    EKG EKG Interpretation Date/Time:  Wednesday June 15 2023 18:28:21 EDT Ventricular Rate:  93 PR Interval:  188 QRS Duration:  86 QT Interval:  414 QTC Calculation: 514 R Axis:  78  Text Interpretation: Normal sinus rhythm Nonspecific ST abnormality Prolonged QT Abnormal ECG When compared with ECG of 04-May-2023 10:05, QRS axis Shifted left T wave inversion no longer evident in Lateral leads Since last tracing qt has prolonged Confirmed by Eber Hong ([id number]) on 06/15/2023 7:47:03 PM  Radiology No results found.  Procedures .Critical Care  Performed by: Eber Hong, MD Authorized by: Eber Hong, MD   Critical care provider statement:    Critical care time (minutes):  30   Critical care time was exclusive of:  Separately billable procedures and treating other patients and teaching time   Critical care was necessary to treat or prevent imminent or life-threatening deterioration of the following conditions: GI bleed, severe anemia.   Critical care was time spent personally by me on the following activities:  Development of treatment plan with patient or surrogate, discussions with consultants, evaluation of patient's response to treatment, examination of patient, ordering and review of laboratory studies, ordering and review of radiographic studies, ordering and performing treatments and interventions, pulse oximetry, re-evaluation of patient's condition, review of old charts and obtaining history from patient or surrogate   I assumed direction of critical care for this patient from another provider in my specialty: no     Care discussed with: admitting provider   Comments:           Medications Ordered in ED Medications  0.9 %  sodium chloride infusion (Manually program via Guardrails IV Fluids) (has no administration in time range)  pantoprazole (PROTONIX) injection 40 mg (has no administration in time range)    ED Course/ Medical Decision Making/  A&P Clinical Course as of 06/15/23 2048  Wed Jun 15, 2023  2037 Platelets: PLATELET CLUMPS NOTED ON SMEAR, COUNT APPEARS DECREASED [BM]    Clinical Course User Index [BM] Eber Hong, MD                                 Medical Decision Making Amount and/or Complexity of Data Reviewed Labs: ordered. Radiology: ordered.  Risk Prescription drug management.    This patient presents to the ED for concern of shortness of breath on exertion, this involves an extensive number of treatment options, and is a complaint that carries with it a high risk of complications and morbidity.  The differential diagnosis includes could be primary cardiac or pulmonary cause however the patient has severe anemia, clear lungs, vital signs are unremarkable.  She does have a borderline tachycardia but she is not hypoxic, she is not hypotensive and she is not febrile.  Will obtain chest x-ray as well as repeating labs to make sure that she is actually anemic.  The patient is agreeable to the plan.   Co morbidities that complicate the patient evaluation  History of anemia and gastrointestinal bleeding, she is anticoagulated   Additional history obtained:  Additional history obtained from electronic medical record External records from outside source obtained and reviewed including prior endoscopy and colonoscopy   Lab Tests:  I Ordered, and personally interpreted labs.  The pertinent results include: Severe anemia hemoglobin of 4.8   Imaging Studies ordered:  I ordered imaging studies including chest x-ray I independently visualized and interpreted imaging which showed no acute findings I agree with the radiologist interpretation   Cardiac Monitoring: / EKG:  The patient was maintained on a cardiac monitor.  I personally viewed and interpreted the cardiac monitored which showed an underlying  rhythm of: Mild sinus tachycardia   Consultations Obtained:  I requested consultation with the GI  physicians and hospitalist,  and discussed lab and imaging findings as well as pertinent plan - they recommend: Admit, transfusion, Protonix, GI will see in morning GI:  [facility] [place]   Problem List / ED Course / Critical interventions / Medication management  Acute upper GI bleed, significant drop in hemoglobin, very formed stool in the rectal vault, hemodynamically looking okay after starting blood transfusion, tachycardia is improving, blood pressure 111/60 I ordered medication including blood products for anemia, Protonix for upper GI bleed Reevaluation of the patient after these medicines showed that the patient improved I have reviewed the patients home medicines and have made adjustments as needed   Social Determinants of Health:  Anticoagulated   Test / Admission - Considered:  Needs admission to the hospital         Final Clinical Impression(s) / ED Diagnoses Final diagnoses:  Gastrointestinal hemorrhage, unspecified gastrointestinal hemorrhage type  Severe anemia    Rx / DC Orders ED Discharge Orders     None         Eber Hong, MD 06/15/23 2048

## 2023-06-16 ENCOUNTER — Encounter (HOSPITAL_COMMUNITY)
Admission: EM | Disposition: A | Discharge status: Home / Self Care | Payer: Self-pay | Source: Home / Self Care | Attending: Internal Medicine

## 2023-06-16 ENCOUNTER — Inpatient Hospital Stay (HOSPITAL_COMMUNITY): Admitting: Anesthesiology

## 2023-06-16 ENCOUNTER — Encounter (HOSPITAL_COMMUNITY): Payer: Self-pay | Admitting: Internal Medicine

## 2023-06-16 DIAGNOSIS — E039 Hypothyroidism, unspecified: Secondary | ICD-10-CM

## 2023-06-16 DIAGNOSIS — D649 Anemia, unspecified: Secondary | ICD-10-CM | POA: Diagnosis not present

## 2023-06-16 DIAGNOSIS — I480 Paroxysmal atrial fibrillation: Secondary | ICD-10-CM

## 2023-06-16 DIAGNOSIS — I129 Hypertensive chronic kidney disease with stage 1 through stage 4 chronic kidney disease, or unspecified chronic kidney disease: Secondary | ICD-10-CM

## 2023-06-16 DIAGNOSIS — K31819 Angiodysplasia of stomach and duodenum without bleeding: Secondary | ICD-10-CM

## 2023-06-16 DIAGNOSIS — N184 Chronic kidney disease, stage 4 (severe): Secondary | ICD-10-CM | POA: Diagnosis not present

## 2023-06-16 DIAGNOSIS — I2510 Atherosclerotic heart disease of native coronary artery without angina pectoris: Secondary | ICD-10-CM

## 2023-06-16 DIAGNOSIS — D62 Acute posthemorrhagic anemia: Secondary | ICD-10-CM | POA: Diagnosis not present

## 2023-06-16 DIAGNOSIS — J449 Chronic obstructive pulmonary disease, unspecified: Secondary | ICD-10-CM

## 2023-06-16 DIAGNOSIS — E785 Hyperlipidemia, unspecified: Secondary | ICD-10-CM | POA: Diagnosis not present

## 2023-06-16 HISTORY — PX: HOT HEMOSTASIS: SHX5433

## 2023-06-16 HISTORY — PX: ESOPHAGOGASTRODUODENOSCOPY: SHX5428

## 2023-06-16 LAB — BASIC METABOLIC PANEL
Anion gap: 7 (ref 5–15)
BUN: 48 mg/dL — ABNORMAL HIGH (ref 8–23)
CO2: 19 mmol/L — ABNORMAL LOW (ref 22–32)
Calcium: 7.8 mg/dL — ABNORMAL LOW (ref 8.9–10.3)
Chloride: 112 mmol/L — ABNORMAL HIGH (ref 98–111)
Creatinine, Ser: 1.43 mg/dL — ABNORMAL HIGH (ref 0.44–1.00)
GFR, Estimated: 37 mL/min — ABNORMAL LOW (ref >60)
Glucose, Bld: 94 mg/dL (ref 70–99)
Potassium: 4.2 mmol/L (ref 3.5–5.1)
Sodium: 138 mmol/L (ref 135–145)

## 2023-06-16 LAB — CBC
HCT: 25.6 % — ABNORMAL LOW (ref 36.0–46.0)
Hemoglobin: 8 g/dL — ABNORMAL LOW (ref 12.0–15.0)
MCH: 28.2 pg (ref 26.0–34.0)
MCHC: 31.3 g/dL (ref 30.0–36.0)
MCV: 90.1 fL (ref 80.0–100.0)
Platelets: DECREASED 10*3/uL (ref 150–400)
RBC: 2.84 MIL/uL — ABNORMAL LOW (ref 3.87–5.11)
RDW: 19.5 % — ABNORMAL HIGH (ref 11.5–15.5)
WBC: 9.8 10*3/uL (ref 4.0–10.5)
nRBC: 0 % (ref 0.0–0.2)

## 2023-06-16 LAB — HEMOGLOBIN AND HEMATOCRIT, BLOOD
HCT: 29.2 % — ABNORMAL LOW (ref 36.0–46.0)
Hemoglobin: 9.4 g/dL — ABNORMAL LOW (ref 12.0–15.0)

## 2023-06-16 LAB — MRSA NEXT GEN BY PCR, NASAL: MRSA by PCR Next Gen: NOT DETECTED

## 2023-06-16 SURGERY — EGD (ESOPHAGOGASTRODUODENOSCOPY)
Anesthesia: General

## 2023-06-16 MED ORDER — ORAL CARE MOUTH RINSE: 15.0000 mL | OROMUCOSAL | Status: DC | PRN

## 2023-06-16 MED ORDER — LIDOCAINE HCL (CARDIAC) PF 100 MG/5ML IV SOSY
PREFILLED_SYRINGE | INTRAVENOUS | Status: DC | PRN
  Administered 2023-06-16: 50 mg via INTRAVENOUS

## 2023-06-16 MED ORDER — PROPOFOL 500 MG/50ML IV EMUL
INTRAVENOUS | Status: DC | PRN
  Administered 2023-06-16: 20 mg via INTRAVENOUS
  Administered 2023-06-16: 50 mg via INTRAVENOUS
  Administered 2023-06-16 (×5): 20 mg via INTRAVENOUS

## 2023-06-16 MED ORDER — PEG 3350-KCL-NA BICARB-NACL 420 G PO SOLR
4000.0000 mL | Freq: Once | ORAL | Status: AC
  Administered 2023-06-16: 4000 mL via ORAL

## 2023-06-16 MED ORDER — ACETAMINOPHEN 325 MG PO TABS
650.0000 mg | ORAL_TABLET | ORAL | Status: DC | PRN
  Administered 2023-06-16: 650 mg via ORAL

## 2023-06-16 MED ORDER — PHENYLEPHRINE 80 MCG/ML (10ML) SYRINGE FOR IV PUSH (FOR BLOOD PRESSURE SUPPORT)
PREFILLED_SYRINGE | INTRAVENOUS | Status: DC | PRN
  Administered 2023-06-16 (×4): 160 ug via INTRAVENOUS

## 2023-06-16 MED ORDER — LACTATED RINGERS IV SOLN
INTRAVENOUS | Status: DC | PRN
Start: 2023-06-16 — End: 2023-06-16

## 2023-06-16 MED ORDER — DEXMEDETOMIDINE HCL IN NACL 80 MCG/20ML IV SOLN
INTRAVENOUS | Status: DC | PRN
  Administered 2023-06-16: 8 ug via INTRAVENOUS

## 2023-06-16 MED ORDER — ACETAMINOPHEN 325 MG PO TABS
ORAL_TABLET | ORAL | Status: AC
Start: 2023-06-16 — End: 2023-06-16
  Filled 2023-06-16: qty 2

## 2023-06-16 MED ORDER — HYDRALAZINE HCL 20 MG/ML IJ SOLN
10.0000 mg | Freq: Four times a day (QID) | INTRAMUSCULAR | Status: DC | PRN
  Administered 2023-06-16: 10 mg via INTRAVENOUS
  Filled 2023-06-16: qty 1

## 2023-06-16 MED ORDER — BISACODYL 5 MG PO TBEC
10.0000 mg | DELAYED_RELEASE_TABLET | Freq: Once | ORAL | Status: AC
  Administered 2023-06-16: 10 mg via ORAL
  Filled 2023-06-16: qty 2

## 2023-06-16 MED ORDER — EPHEDRINE SULFATE-NACL 50-0.9 MG/10ML-% IV SOSY
PREFILLED_SYRINGE | INTRAVENOUS | Status: DC | PRN
  Administered 2023-06-16 (×2): 10 mg via INTRAVENOUS

## 2023-06-16 NOTE — H&P (View-Only) (Signed)
 Gastroenterology Consult   Referring Provider: No ref. provider found Primary Care Physician:  Benita Stabile, MD Primary Gastroenterologist:  Gerrit Friends.Rourk, MD   Patient ID: Patricia Jackson; [id number]; [birth year]   Admit date: 06/15/2023  LOS: 1 day   Date of Consultation: 06/16/2023  Reason for Consultation:  severe symptomatic anemia, acute on chronic anemia  History of Present Illness   Patricia Jackson is a 83 y.o. year old female atrial flutter/A-fib chronic Eliquis, IDA CAD, COPD, HTN, breast cancer, CKD previously receiving iron infusion outpatient, NSTEMI with DES in 2017, and prior upper GI bleed secondary to angioectasias treated with APC therapy during prior hospitalization in August 2024 who presented to the ED at the recommendation of PCP for low hemoglobin, increased shortness of breath, dizziness, and fatigue.  Hemoglobin found to be 4.8.  GI consulted for further evaluation of symptomatic anemia.  ED course: -Hemoglobin 4.8, MCV 100, platelets unable to calculate.  Creatinine 1.56, albumin 3.1, potassium 5.2, magnesium 2.3 -Stool heme positive, formed stool rectal vault -Blood transfusion started, started on Protonix. -Initially mildly tachycardic per EDP.  Increased work of breathing.  Consult:  Currently denies any abdominal pain, nausea, or vomiting.  Began having dizziness and increased shortness of breath starting this past Saturday.  Went out to breakfast with her son and felt very dizzy and lightheaded and needed to go back home instead of going shopping with her son.  Symptoms became progressively worse over the next several days and that she began feeling much more short of breath which prompted her ED visit.  Per her report to me, she has not had Eliquis since Saturday as she had 1 pill left on Saturday and she requested a refill from the pharmacy but she has not had her niece pick this up yet.  She does endorse some dark/black stools intermittently since her  symptoms began.  She reports being told there was some red.  Nurse overnight denies any bloody bowel movements.  Reports that decently good appetite, denies any regular NSAID use.  Hospitalization in August 2024 with hemoglobin of 6.  Underwent EGD as noted below.  EGD February 2018: LA grade a esophagitis, hiatal hernia.  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  Continue PPI BID  Last Colonoscopy in 2018 with few tubular adenomas and sigmoid diverticulosis (no repeat recommended due to age)    Past Medical History:  Diagnosis Date   A-fib [facility])    Atrial flutter (HCC)    On Eliquis in 8/17 but discontinued after stent placement   Breast cancer (HCC)    remote   CAD in native artery 03/16/2016   COPD (chronic obstructive pulmonary disease) (HCC)    Dysrhythmia    Hypertension    Iron deficiency anemia 06/12/2021   NSTEMI (non-ST elevated myocardial infarction) (HCC)    2017   S/P angioplasty with stent 03/15/16 DES Resolute, pLCX 03/16/2016   Vitreous hemorrhage of left eye (HCC) 08/01/2019    Past Surgical History:  Procedure Laterality Date   APPENDECTOMY     BREAST SURGERY Left    CARDIAC CATHETERIZATION N/A 03/15/2016   Procedure: Left Heart Cath and Coronary Angiography;  Surgeon: Lyn Records, MD;  Location: [facility] INVASIVE CV LAB;  Service: Cardiovascular;  Laterality: N/A;   CARDIAC  CATHETERIZATION N/A 03/15/2016   Procedure: Coronary Stent Intervention;  Surgeon: Lyn Records, MD;  Location: [facility] INVASIVE CV LAB;  Service: Cardiovascular;  Laterality: N/A;   CARDIOVERSION N/A 02/04/2023   Procedure: CARDIOVERSION;  Surgeon: Marjo Bicker, MD;  Location: [facility] ORS;  Service: Cardiovascular;   Laterality: N/A;   COLONOSCOPY     remote   COLONOSCOPY N/A 05/26/2016   Procedure: COLONOSCOPY;  Surgeon: Corbin Ade, MD;  Location: [facility] ENDO SUITE;  Service: Endoscopy;  Laterality: N/A;  845   ESOPHAGOGASTRODUODENOSCOPY N/A 05/26/2016   Procedure: ESOPHAGOGASTRODUODENOSCOPY (EGD);  Surgeon: Corbin Ade, MD;  Location: [facility];  Service: Endoscopy;  Laterality: N/A;   ESOPHAGOGASTRODUODENOSCOPY (EGD) WITH PROPOFOL N/A 11/22/2022   Procedure: ESOPHAGOGASTRODUODENOSCOPY (EGD) WITH PROPOFOL;  Surgeon: Lanelle Bal, DO;  Location: [facility];  Service: Endoscopy;  Laterality: N/A;   EYE SURGERY     EYE SURGERY Left    Dr. Barbaraann Barthel   HOT HEMOSTASIS  11/22/2022   Procedure: HOT HEMOSTASIS (ARGON PLASMA COAGULATION/BICAP);  Surgeon: Lanelle Bal, DO;  Location: [facility];  Service: Endoscopy;;   MALONEY DILATION N/A 05/26/2016   Procedure: Elease Hashimoto DILATION;  Surgeon: Corbin Ade, MD;  Location: [facility];  Service: Endoscopy;  Laterality: N/A;   TEE WITHOUT CARDIOVERSION N/A 02/04/2023   Procedure: TRANSESOPHAGEAL ECHOCARDIOGRAM (TEE);  Surgeon: Marjo Bicker, MD;  Location: [facility]S;  Service: Cardiovascular;  Laterality: N/A;   TOOTH EXTRACTION N/A 01/15/2022   Procedure: DENTAL RESTORATION/EXTRACTIONS;  Surgeon: Ocie Doyne, DMD;  Location: [facility] OR;  Service: Oral Surgery;  Laterality: N/A;   VITRECTOMY Left 10/03/2019   Dr. Luciana Axe, Vitrectomy, Focal Laser, Removal of Silicone Oil    Prior to Admission medications   Medication Sig Start Date End Date Taking? Authorizing Provider  acetaminophen (TYLENOL) 500 MG tablet Take 1,000 mg by mouth every 8 (eight) hours as needed for mild pain or moderate pain. 03/21/17   [provider]  amiodarone (PACERONE) 200 MG tablet Take 1 Tablet ( 200 mg ) Daily and none on Sunday 05/04/23   Marinus Maw, MD  apixaban (ELIQUIS) 2.5 MG TABS tablet Take 1 tablet (2.5 mg total) by mouth 2 (two) times daily.  03/11/23 09/07/23  Antoine Poche, MD  buPROPion (WELLBUTRIN XL) 150 MG 24 hr tablet Take 150 mg by mouth daily. 11/18/22   [provider]  Cholecalciferol (VITAMIN D3) 50 MCG (2000 UT) TABS Take 2,000 Units by mouth daily.    [provider]  levothyroxine (SYNTHROID) 50 MCG tablet Take 50 mcg by mouth daily before breakfast.  09/20/19   [provider]  lidocaine (LIDODERM) 5 % Place 1 patch onto the skin daily. Remove & Discard patch within 12 hours or as directed by MD 02/06/23   Sherryll Burger, Pratik D, DO  nitroGLYCERIN (NITROSTAT) 0.4 MG SL tablet Place 0.4 mg under the tongue every 5 (five) minutes as needed for chest pain. 06/09/19   [provider]  rosuvastatin (CRESTOR) 20 MG tablet Take 20 mg by mouth daily.  02/26/17   [provider]  sertraline (ZOLOFT) 25 MG tablet Take 25 mg by mouth daily.    [provider]  [name] 200-62.5-25 MCG/ACT AEPB Inhale 1 puff into the lungs daily. 11/02/22   [provider]    Current Facility-Administered Medications  Medication Dose Route Frequency Provider Last Rate Last Admin   amiodarone (PACERONE) tablet 200 mg  200 mg Oral Once per day on  Monday Tuesday Wednesday Thursday Friday Saturday Elgergawy, Leana Roe, MD       fluticasone furoate-vilanterol (BREO ELLIPTA) 200-25 MCG/ACT 1 puff  1 puff Inhalation Daily Elgergawy, Leana Roe, MD       And   umeclidinium bromide (INCRUSE ELLIPTA) 62.5 MCG/ACT 1 puff  1 puff Inhalation Daily Elgergawy, Leana Roe, MD       hydrALAZINE (APRESOLINE) injection 10 mg  10 mg Intravenous Q6H PRN Mansy, Jan A, MD   10 mg at 06/16/23 0455   levothyroxine (SYNTHROID) tablet 50 mcg  50 mcg Oral Q0600 Elgergawy, Leana Roe, MD   50 mcg at 06/16/23 0542   pantoprazole (PROTONIX) injection 40 mg  40 mg Intravenous Q8H Elgergawy, Leana Roe, MD   40 mg at 06/16/23 0455   Followed by   Melene Muller ON 06/19/2023] pantoprazole (PROTONIX) injection 40 mg  40 mg Intravenous Q12H  Elgergawy, Leana Roe, MD       rosuvastatin (CRESTOR) tablet 20 mg  20 mg Oral QHS Elgergawy, Leana Roe, MD        Allergies as of 06/15/2023 - Review Complete 06/15/2023  Allergen Reaction Noted   Povidone-iodine Other (See Comments) 12/20/2022   Sulfa antibiotics Other (See Comments) 08/02/2013   Sulfamethoxazole Other (See Comments) 03/23/2017    Family History  Problem Relation Age of Onset   Heart disease Mother    COPD Father    Cancer Sister        unknown primary   Cancer Brother        unknown primary   Cancer Brother    Hypertension Son    Colon cancer Neg Hx     Social History   Socioeconomic History   Marital status: Divorced    Spouse name: Not on file   Number of children: 1   Years of education: Not on file   Highest education level: Not on file  Occupational History   Occupation: Retired  Tobacco Use   Smoking status: Former    Current packs/day: 0.00    Types: Cigarettes    Quit date: 12/28/2015    Years since quitting: 7.4    Passive exposure: Past   Smokeless tobacco: Never   Tobacco comments:    Patient quit within the last 10 years  Vaping Use   Vaping status: Never Used  Substance and Sexual Activity   Alcohol use: Never   Drug use: Never   Sexual activity: Not Currently  Other Topics Concern   Not on file  Social History Narrative   ** Merged History Encounter **       Social Drivers of Health   Financial Resource Strain: Low Risk  (11/26/2022)   Overall Financial Resource Strain (CARDIA)    Difficulty of Paying Living Expenses: Not hard at all  Food Insecurity: No Food Insecurity (06/16/2023)   Hunger Vital Sign    Worried About Running Out of Food in the Last Year: Never true    Ran Out of Food in the Last Year: Never true  Transportation Needs: No Transportation Needs (06/16/2023)   PRAPARE - Administrator, Civil Service (Medical): No    Lack of Transportation (Non-Medical): No  Physical Activity: Not on file   Stress: No Stress Concern Present (11/26/2022)   [employer] of Occupational Health - Occupational Stress Questionnaire    Feeling of Stress : Not at all  Social Connections: Unknown (06/16/2023)   Social Connection and Isolation Panel [NHANES]    Frequency of  Communication with Friends and Family: Twice a week    Frequency of Social Gatherings with Friends and Family: Twice a week    Attends Religious Services: 1 to 4 times per year    Active Member of [employer] or Organizations: Yes    Attends Banker Meetings: Never    Marital Status: Patient declined  Intimate Partner Violence: Not At Risk (06/16/2023)   Humiliation, Afraid, Rape, and Kick questionnaire    Fear of Current or Ex-Partner: No    Emotionally Abused: No    Physically Abused: No    Sexually Abused: No     Review of Systems   Gen: + fatigue. Denies any fever, chills, loss of appetite, change in weight or weight loss CV: + Afib. Denies chest pain,  syncope, edema  Resp: + dyspnea on exertion. + baseline dyspnea at rest. Denies cough, wheezing, coughing up blood, and pleurisy. GI: see HPI GU : Denies urinary burning, blood in urine, urinary frequency, and urinary incontinence. Psych: Denies depression, anxiety, memory loss, hallucinations, and confusion. Heme: Denies bruising or bleeding Neuro:  + dizziness, lightheadedness. Denies any headaches, dizziness, paresthesias, shaking  Physical Exam   Vital Signs in last 24 hours: Temp:  [97.6 F (36.4 C)-99 F (37.2 C)] 98 F (36.7 C) (03/13 0805) Pulse Rate:  [76-95] 83 (03/13 0700) Resp:  [14-28] 26 (03/13 0700) BP: (111-165)/(49-86) 158/81 (03/13 0700) SpO2:  [92 %-100 %] 100 % (03/13 0700) Weight:  [62.1 kg-62.8 kg] 62.8 kg (03/12 2201)   General:   Alert,  Well-developed, pleasant and cooperative in NAD Head:  Normocephalic and atraumatic. Eyes:  Sclera clear, no icterus.   Conjunctiva pink. Ears:  Normal auditory acuity. Mouth:  No deformity  or lesions, dentition normal. Neck:  Supple; no masses Lungs:  Clear throughout to auscultation. Mild increased work of breathing. No wheezes, crackles, or rhonchi. No acute distress. Heart:  Regular rate and rhythm; no murmurs, clicks, rubs,  or gallops. Abdomen:  Soft, nontender and nondistended. No masses, hepatosplenomegaly or hernias noted. Normal bowel sounds, without guarding, and without rebound. Rectal: deferred Msk:  Symmetrical without gross deformities. Normal posture. Extremities:  Without clubbing or edema. Neurologic:  Alert and oriented x4. Skin:  Intact without significant lesions or rashes. Psych:  Alert and cooperative. Normal mood and affect.  Intake/Output from previous day: 03/12 0701 - 03/13 0700 In: 1359 [I.V.:50; Blood:1309] Out: 1300 [Urine:1300] Intake/Output this shift: No intake/output data recorded.  Labs/Studies   Recent Labs Recent Labs    06/15/23 1854 06/16/23 0458  WBC 10.4 9.8  HGB 4.8* 8.0*  HCT 16.4* 25.6*  PLT PLATELET CLUMPS NOTED ON SMEAR, COUNT APPEARS DECREASED PLATELET CLUMPS NOTED ON SMEAR, COUNT APPEARS DECREASED   BMET Recent Labs    06/15/23 1854 06/16/23 0458  NA 138 138  K 5.2* 4.2  CL 113* 112*  CO2 16* 19*  GLUCOSE 156* 94  BUN 47* 48*  CREATININE 1.56* 1.43*  CALCIUM 8.2* 7.8*   LFT Recent Labs    06/15/23 1854  PROT 5.7*  ALBUMIN 3.1*  AST 42*  ALT 23  ALKPHOS 49  BILITOT 0.4   PT/INR No results for input(s): "LABPROT", "INR" in the last 72 hours. Hepatitis Panel No results for input(s): "HEPBSAG", "HCVAB", "HEPAIGM", "HEPBIGM" in the last 72 hours. C-Diff No results for input(s): "CDIFFTOX" in the last 72 hours.  Radiology/Studies DG Chest Port 1 View Result Date: 06/15/2023 CLINICAL DATA:  Shortness of breath EXAM: PORTABLE CHEST 1 VIEW COMPARISON:  02/02/2023, CT 09/12/2022 FINDINGS: Hyperinflation with emphysema and diffuse bronchitic changes. No focal airspace disease, pleural effusion or  pneumothorax. Stable cardiomediastinal silhouette with aortic atherosclerosis. No pneumothorax. Clips in the left axilla IMPRESSION: No active disease. Hyperinflation with emphysema and diffuse bronchitic changes. Electronically Signed   By: Jasmine Pang M.D.   On: 06/15/2023 21:56   Assessment   Patricia Jackson is a 83 y.o. year old female atrial flutter/A-fib chronic Eliquis, IDA CAD, COPD, HTN, breast cancer, CKD previously receiving iron infusion outpatient, NSTEMI with DES in 2017 who presented to the ED at the recommendation of PCP for low hemoglobin, increased shortness of breath, dizziness, and fatigue.  Hemoglobin found to be 4.8.  GI consulted for further evaluation of symptomatic anemia.  Severe symptomatic anemia:  - Presented Hgb 4.8, s/p 3u PRBC, improved to 8 this morning - Stool heme positive in ED - Feeling weak, dizzy, lightheaded since Saturday 3/8 - Maintained on Eliquis for Afib, last dose Saturday 3/8 or Monday 3/10? (awaiting refill on medication) - Prior EGD with single bleeding gastric angiectasia treated with APC therapy and 3 other nonbleeding angioectasias within the stomach and duodenum during prior admission (Aug 2024). - Last Colonoscopy in 2018 with few tubular adenomas and sigmoid diverticulosis (no repeat recommended due to age) - Rechecking Hgb this AM given blood possibly checked while blood running -Differentials include dieulafoy lesion, recurrent angioectasias, colonic angioectasias.  Given prior history, upper GI source most likely.  Plan / Recommendations   Recheck H/H this morning Transfuse hgb <7 PPI IV BID Hold Eliquis NPO EGD today with Dr. Tasia Catchings Potential for inpatient colonoscopy if no source of bleed identified on EGD. Should consider outpatient colonoscopy if not performed inpatient Continue outpatient iron therapy    06/16/2023, 8:30 AM  Brooke Bonito, MSN, FNP-BC, AGACNP-BC [facility]

## 2023-06-16 NOTE — Interval H&P Note (Signed)
 History and Physical Interval Note:  06/16/2023 2:37 PM  Patricia Jackson  has presented today for surgery, with the diagnosis of severe symptomatic anemia, history of gastric avm, heme positive stool.  The various methods of treatment have been discussed with the patient and family. After consideration of risks, benefits and other options for treatment, the patient has consented to  Procedure(s): EGD (ESOPHAGOGASTRODUODENOSCOPY) (N/A) as a surgical intervention.  The patient's history has been reviewed, patient examined, no change in status, stable for surgery.  I have reviewed the patient's chart and labs.  Questions were answered to the patient's satisfaction.    We will proceed with EGD  as scheduled.    I thoroughly discussed with the patient the procedure, including the risks involved. Patient understands what the procedure involves including the benefits and any risks. Patient understands alternatives to the proposed procedure. Risks including (but not limited to) bleeding, tearing of the lining (perforation), rupture of adjacent organs, problems with heart and lung function, infection, and medication reactions. A small percentage of complications may require surgery, hospitalization, repeat endoscopic procedure, and/or transfusion.  Patient understood and agreed.    Patricia Jackson

## 2023-06-16 NOTE — Op Note (Signed)
 [facility] Patient Name: Patricia Jackson Procedure Date: 06/16/2023 2:23 PM MRN: [id number] Date of Birth: 03-09-1941 Attending MD: Sanjuan Dame , MD, [id number] CSN: [id number] Age: 83 Admit Type: Outpatient Procedure:                Upper GI endoscopy Indications:              Acute post hemorrhagic anemia Providers:                Sanjuan Dame, MD, Angelica Ran, Dyann Ruddle Referring MD:              Medicines:                Monitored Anesthesia Care Complications:            No immediate complications. Estimated Blood Loss:     Estimated blood loss: none. Procedure:                Pre-Anesthesia Assessment:                           - Prior to the procedure, a History and Physical                            was performed, and patient medications and                            allergies were reviewed. The patient's tolerance of                            previous anesthesia was also reviewed. The risks                            and benefits of the procedure and the sedation                            options and risks were discussed with the patient.                            All questions were answered, and informed consent                            was obtained. Prior Anticoagulants: The patient has                            taken Eliquis (apixaban), last dose was 1 day prior                            to procedure. ASA Grade Assessment: III - A patient                            with severe systemic disease. After reviewing the                            risks and benefits, the patient was deemed in  satisfactory condition to undergo the procedure.                           After obtaining informed consent, the endoscope was                            passed under direct vision. Throughout the                            procedure, the patient's blood pressure, pulse, and                            oxygen saturations were monitored  continuously. The                            [id number]-[id number] ([id number]) scope was introduced through the                            mouth, and advanced to the second part of duodenum.                            The upper GI endoscopy was accomplished without                            difficulty. The patient tolerated the procedure                            well. Scope In: 2:51:06 PM Scope Out: 3:07:22 PM Total Procedure Duration: 0 hours 16 minutes 16 seconds  Findings:      The examined esophagus was normal.      A single small angioectasia with no bleeding was found in the stomach.       Coagulation for hemostasis using argon plasma at 0.3 liters/minute and       20 watts was successful.      Two small angioectasias without bleeding were found in the second       portion of the duodenum. Coagulation for hemostasis using argon plasma       at 0.3 liters/minute and 20 watts was successful.      There is no endoscopic evidence of bleeding in the entire examined       stomach. Impression:               - Normal esophagus.                           - A single non-bleeding angioectasia in the                            stomach. Treated with argon plasma coagulation                            (APC).                           - Two non-bleeding angioectasias in the duodenum.  Treated with argon plasma coagulation (APC).                           - No specimens collected. 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 All rights reserved. The codes documented in this report are preliminary and upon coder review may  be revised to meet current compliance requirements. Sanjuan Dame, MD Sanjuan Dame, MD 06/16/2023 3:47:39 PM This report has been signed electronically. Number of Addenda: 0

## 2023-06-16 NOTE — TOC Initial Note (Signed)
 Transition of Care [facility]) - Initial/Assessment Note    Patient Details  Name: Patricia Jackson MRN: [id number] Date of Birth: 03/12/1941  Transition [facility]) CM/SW Contact:    Leitha Bleak, RN Phone Number: 06/16/2023, 11:03 AM  Clinical Narrative:        Patient admitted with acute blood loss anemia. Patient has a high risk for readmission.  GI consulted, plans to go to Endo today. Possibly discharge tomorrow.  Patient lives with her grandson and his wife. She is mostly independent in completing her ADLs. Pt states that if she cannot drive to appointments her family will get her there. They have used [employer] in the past. They have all equipment needed in the home.    Expected Discharge Plan: Home/Self Care Barriers to Discharge: Continued Medical Work up   Patient Goals and CMS Choice Patient states their goals for this hospitalization and ongoing recovery are:: return home. CMS Medicare.gov Compare Post Acute Care list provided to:: Patient       Expected Discharge Plan and Services     Post Acute Care Choice: Durable Medical Equipment Living arrangements for the past 2 months: Single Family Home                     Prior Living Arrangements/Services Living arrangements for the past 2 months: Single Family Home Lives with:: Adult Children            Current home services: DME    Activities of Daily Living   Affect (typically observed): Accepting Orientation: : Oriented to Self, Oriented to Place Alcohol / Substance Use: Not Applicable Psych Involvement: No (comment)  Admission diagnosis:  Acute blood loss anemia [D62] Severe anemia [D64.9] Gastrointestinal hemorrhage, unspecified gastrointestinal hemorrhage type [K92.2] Patient Active Problem List   Diagnosis Date Noted   Acute blood loss anemia 06/15/2023   Elevated brain natriuretic peptide (BNP) level 02/03/2023   Paroxysmal atrial fibrillation with RVR (HCC) 02/02/2023   Malnutrition of moderate  degree 01/24/2023   Atrial fibrillation with RVR (HCC) 01/20/2023   On anticoagulant therapy 11/21/2022   ABLA (acute blood loss anemia) 11/21/2022   Raynaud's syndrome 11/20/2022   Osteoarthritis 11/20/2022   Generalized anxiety disorder 11/18/2022   Positive antinuclear antibody 11/02/2022   Insomnia 11/02/2022   Gastroesophageal reflux disease without esophagitis 11/01/2022   Abrasion of lower back 10/11/2022   Atrial fibrillation (HCC) 09/23/2022   Vitamin D deficiency 09/23/2022   Syncope 09/13/2022   CAP (community acquired pneumonia) 09/12/2022   sepsis from pneumonia 07/26/2022   intermittent confusion 07/26/2022   Hyponatremia 07/26/2022   Preoperative evaluation of a medical condition to rule out surgical contraindications (TAR required) 05/14/2022   Fracture of distal end of radius 05/02/2022   Left wrist pain 04/28/2022   Osteoporosis 01/19/2022   DOE (dyspnea on exertion)    Acidosis, unspecified 06/20/2021   Elevated troponin 06/19/2021   Iron deficiency anemia 06/12/2021   Acquired hypothyroidism 05/15/2021   Chronic kidney disease, 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   Essential hypertension 12/10/2020   Retinal hemorrhage, right eye 11/04/2020   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   Traction detachment of left retina 08/03/2019   Left retinal detachment 08/01/2019   Age-related macular degeneration 08/01/2019   Retinal hemorrhage of left eye 08/01/2019   Advanced nonexudative age-related macular degeneration of left eye with subfoveal involvement 08/01/2019   Retinal detachment 07/26/2019   Choroidal detachment of right eye 07/26/2019   Exudative  retinopathy of left eye 07/26/2019   Thrombocytopenic disorder (HCC) 07/04/2019   Orthostatic syncope 08/27/2017   Atypical chest pain 08/27/2017   Atrial flutter (HCC) 10/28/2016   Chronic kidney disease, stage 4 (severe) (HCC) 10/28/2016   Chronic obstructive pulmonary  disease (HCC) 10/28/2016   Depression 10/28/2016   Acute kidney injury superimposed on stage 4 chronic kidney disease (HCC) 09/08/2016   Hyperglycemia 09/08/2016   Esophageal dysphagia    Anemia 04/07/2016   CAD in native artery 03/16/2016   S/P angioplasty with stent 03/15/16 DES Resolute, pLCX 03/16/2016   NSTEMI (non-ST elevated myocardial infarction) (HCC) 03/12/2016   Dyspnea on exertion    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HCC) 10/31/2015   Hyperlipidemia 10/31/2015   Atrial flutter with rapid ventricular response (HCC) 10/30/2015   Back pain 11/26/2013   Arm pain 11/26/2013   Chest pain 11/26/2013   PCP:  Benita Stabile, MD Pharmacy:   [facility] - [place],  - [address] [place] [address] Phone: [phone] Fax: [phone]    Social Drivers of Health (SDOH) Social History: SDOH Screenings   Food Insecurity: No Food Insecurity (06/16/2023)  Housing: Unknown (06/16/2023)  Transportation Needs: No Transportation Needs (06/16/2023)  Utilities: Not At Risk (06/16/2023)  Depression (PHQ2-9): Low Risk  (06/09/2023)  Financial Resource Strain: Low Risk  (11/26/2022)  Social Connections: Unknown (06/16/2023)  Stress: No Stress Concern Present (11/26/2022)  Tobacco Use: Medium Risk (06/15/2023)  Health Literacy: Adequate Health Literacy (11/26/2022)   SDOH Interventions:     Readmission Risk Interventions    06/16/2023   11:02 AM 02/04/2023   10:23 AM 01/21/2023    1:10 PM  Readmission Risk Prevention Plan  Transportation Screening Complete Complete Complete  Medication Review Oceanographer) Complete Complete Complete  PCP or Specialist appointment within 3-5 days of discharge Not Complete    HRI or Home Care Consult Complete Complete Complete  SW Recovery Care/Counseling Consult Complete Complete Complete  Palliative Care Screening Not Applicable Not Applicable Not Applicable  Skilled Nursing Facility Not Applicable Not Applicable Not Applicable

## 2023-06-16 NOTE — Anesthesia Preprocedure Evaluation (Signed)
 Anesthesia Evaluation  Patient identified by MRN, date of birth, ID band Patient awake    Reviewed: Allergy & Precautions, H&P , NPO status , Patient's Chart, lab work & pertinent test results, reviewed documented beta blocker date and time   Airway Mallampati: II  TM Distance: >3 FB Neck ROM: full    Dental no notable dental hx.    Pulmonary neg pulmonary ROS, pneumonia, COPD, former smoker   Pulmonary exam normal breath sounds clear to auscultation       Cardiovascular Exercise Tolerance: Good hypertension, + CAD, + Past MI and + DOE  negative cardio ROS + dysrhythmias Atrial Fibrillation  Rhythm:regular Rate:Normal     Neuro/Psych  PSYCHIATRIC DISORDERS Anxiety Depression    negative neurological ROS  negative psych ROS   GI/Hepatic negative GI ROS, Neg liver ROS,GERD  ,,  Endo/Other  negative endocrine ROSHypothyroidism    Renal/GU Renal diseasenegative Renal ROS  negative genitourinary   Musculoskeletal   Abdominal   Peds  Hematology negative hematology ROS (+) Blood dyscrasia, anemia   Anesthesia Other Findings   Reproductive/Obstetrics negative OB ROS                             Anesthesia Physical Anesthesia Plan  ASA: 3  Anesthesia Plan: General   Post-op Pain Management:    Induction:   PONV Risk Score and Plan: Propofol infusion  Airway Management Planned:   Additional Equipment:   Intra-op Plan:   Post-operative Plan:   Informed Consent: I have reviewed the patients History and Physical, chart, labs and discussed the procedure including the risks, benefits and alternatives for the proposed anesthesia with the patient or authorized representative who has indicated his/her understanding and acceptance.     Dental Advisory Given  Plan Discussed with: CRNA  Anesthesia Plan Comments:        Anesthesia Quick Evaluation

## 2023-06-16 NOTE — Transfer of Care (Signed)
 Immediate Anesthesia Transfer of Care Note  Patient: Patricia Jackson  Procedure(s) Performed: EGD (ESOPHAGOGASTRODUODENOSCOPY) EGD, WITH ARGON PLASMA COAGULATION  Patient Location: PACU  Anesthesia Type:General  Level of Consciousness: awake, drowsy, and patient cooperative  Airway & Oxygen Therapy: Patient Spontanous Breathing  Post-op Assessment: Report given to RN, Post -op Vital signs reviewed and stable, and Patient moving all extremities X 4  Post vital signs: Reviewed and stable  Last Vitals:  Vitals Value Taken Time  BP 121/49 06/16/23 1512  Temp 98.7 06/16/23 1512  Pulse 75 06/16/23 1512  Resp 24 06/16/23 1512  SpO2 99 % 06/16/23 1512  Vitals shown include unfiled device data.  Last Pain:  Vitals:   06/16/23 1443  TempSrc:   PainSc: 0-No pain         Complications: No notable events documented.

## 2023-06-16 NOTE — Progress Notes (Signed)
 Patient underwent EGD  under propofol sedation.  Tolerated the procedure adequately.   FINDINGS:  - Normal esophagus.   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 Treated with argon plasma coagulation ( APC) .   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 Treated with argon plasma coagulation ( APC) . - No specimens collected.  RECOMMENDATIONS  -Protonix daily   -Given severity of anemia and recurrent hospitalization for anemia , recommend plan for colonoscopy tomorrow  Vista Lawman, MD Gastroenterology and Hepatology [facility] Gastroenterology

## 2023-06-16 NOTE — Progress Notes (Signed)
 Progress Note   Patient: Patricia Jackson NFA:[id number] DOB: 1940/04/20 DOA: 06/15/2023     1 DOS: the patient was seen and examined on 06/16/2023   Brief hospital admission narrative: As per H&P written by Dr. Randol Kern on 06/15/2023 Patricia Jackson  is a 83 y.o. 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 -Patient presents to ED for low hemoglobin, she went to PCP office due to complaints of shortness of breath, fatigue, generalized weakness, had normal labs done, where hemoglobin came back significantly low for which she was called and instructed to come back to ED for blood transfusions, she denies any chest pain, fever, chills, patient reports last Eliquis was on Monday morning, as she ran out of Eliquis and she forgot to tell her niece to pick it up for her, niece at bedside and confirmed that, otherwise she has been compliant with her medications  The patient had an upper endoscopy in August 2024 which showed a 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    -In ED hemoglobin was low at 4.8, she had melena, Hemoccult positive, she was altered blood transfusions, ED discussed with GI who recommended admission for PRBC transfusion.  Assessment and Plan: 1-acute blood loss anemia -Hemoglobin 4.8 at time of admission -Status post 3 units PRBC transfusion with repeat hemoglobin around 8.1 -There was concern for patient's blood work this morning being done to close after transfusion -Planning to repeat another CBC to follow through with stability -Continue avoiding NSAIDs, Eliquis or heparin pleural -Continue PPI -Case discussed with GI service with plan for endoscopic evaluation later this morning. -Continue n.p.o. status and follow  further recommendations.  2-paroxysmal atrial fibrillation -Continue treatment with amiodarone -Eliquis on hold secondary to problem #1 -Continue telemetry monitoring and maintain adequate levels of electrolytes.  3-hyperkalemia -Present at time of admission -Resolved after receiving Lokelma -Continue telemetry monitoring.  4-prolonged QTc -Holding medications that can further prolong QT at the moment -Replete electrolytes and follow telemetry evaluation  5-chronic kidney disease stage IV -Renal function appears to be stable and at baseline -Minimize nephrotoxic agents and avoid hypotension -Follow renal function trend.  6-coronary artery disease status post drug-eluting stent to left circumflex in 2017 -Continue Refacto modifications -Continue statin -Continue patient follow-up with cardiology service.  7-hypothyroidism -Continue Synthroid.  8-hypertension -Blood pressure stable -Holding antihypertensive agents at the moment -Follow vital signs.  9-history of COPD -No acute exacerbation currently appreciated -Continue as needed bronchodilator -Continue treatment with Breo and Incruse Ellipta.  10-anxiety -Planning to resume home Wellbutrin and sertraline once stabilization of QT achieved.   Subjective:  No chest pain, no nausea, no vomiting.  Reports overall feeling better.  Good saturation on room air appreciated.  No overt overnight bleeding reported.  Physical Exam: Vitals:   06/16/23 1517 06/16/23 1530 06/16/23 1545 06/16/23 1630  BP: (!) 112/47 (!) 94/43 (!) 103/53   Pulse: 73 71 66   Resp: (!) 23 (!) 24 (!) 24   Temp:   98.4 F (36.9 C) 98.3 F (36.8 C)  TempSrc:    Oral  SpO2: 99% 99% 98%   Weight:      Height:       General exam: Alert, awake, oriented x 3; no chest pain, no nausea vomiting.  Good saturation on room  air. Respiratory system: Good air movement bilaterally; no using accessory muscle. Cardiovascular system:RRR.  No rubs, no gallops,  no JVD. Gastrointestinal system: Abdomen is nondistended, soft and nontender. No organomegaly or masses felt. Normal bowel sounds heard. Central nervous system: No focal neurological deficits. Extremities: No cyanosis or clubbing. Skin: No petechiae. Psychiatry: Judgement and insight appear normal. Mood & affect appropriate.   Data Reviewed: Basic metabolic panel: Sodium 138, potassium 4.2, chloride 112, bicarb 19, BUN 48, creatinine 1.43 and GFR 37 CBC: WBCs 9.8, hemoglobin 9.0 and platelet count noted to be clumped but appears to be decreased in count.  Family Communication: Daughter at bedside.  Disposition: Status is: Inpatient Remains inpatient appropriate because: Continue workup for GI bleed and further transfusion as needed.   Planned Discharge Destination: Home  Time spent: 50 minutes  Author: Vassie Loll, MD 06/16/2023 6:51 PM  For on call review [email or web address].

## 2023-06-16 NOTE — Progress Notes (Signed)
 Patient seen and examined.  She has received 3 units of packed red blood cells and was having increased work of breathing.  Upon physical examination she was having conversational dyspnea and upon lungs examination she had bibasilar crackles.  Will give an extra dose of Lasix and continue monitoring.  Posttransfusion H&H were 8/25.6 up from 4.8/16.4.

## 2023-06-16 NOTE — Consult Note (Signed)
 Gastroenterology Consult   Referring Provider: No ref. provider found Primary Care Physician:  Benita Stabile, MD Primary Gastroenterologist:  Gerrit Friends.Rourk, MD   Patient ID: Patricia Jackson; [id number]; [birth year]   Admit date: 06/15/2023  LOS: 1 day   Date of Consultation: 06/16/2023  Reason for Consultation:  severe symptomatic anemia, acute on chronic anemia  History of Present Illness   Patricia Jackson is a 83 y.o. year old female atrial flutter/A-fib chronic Eliquis, IDA CAD, COPD, HTN, breast cancer, CKD previously receiving iron infusion outpatient, NSTEMI with DES in 2017, and prior upper GI bleed secondary to angioectasias treated with APC therapy during prior hospitalization in August 2024 who presented to the ED at the recommendation of PCP for low hemoglobin, increased shortness of breath, dizziness, and fatigue.  Hemoglobin found to be 4.8.  GI consulted for further evaluation of symptomatic anemia.  ED course: -Hemoglobin 4.8, MCV 100, platelets unable to calculate.  Creatinine 1.56, albumin 3.1, potassium 5.2, magnesium 2.3 -Stool heme positive, formed stool rectal vault -Blood transfusion started, started on Protonix. -Initially mildly tachycardic per EDP.  Increased work of breathing.  Consult:  Currently denies any abdominal pain, nausea, or vomiting.  Began having dizziness and increased shortness of breath starting this past Saturday.  Went out to breakfast with her son and felt very dizzy and lightheaded and needed to go back home instead of going shopping with her son.  Symptoms became progressively worse over the next several days and that she began feeling much more short of breath which prompted her ED visit.  Per her report to me, she has not had Eliquis since Saturday as she had 1 pill left on Saturday and she requested a refill from the pharmacy but she has not had her niece pick this up yet.  She does endorse some dark/black stools intermittently since her  symptoms began.  She reports being told there was some red.  Nurse overnight denies any bloody bowel movements.  Reports that decently good appetite, denies any regular NSAID use.  Hospitalization in August 2024 with hemoglobin of 6.  Underwent EGD as noted below.  EGD February 2018: LA grade a esophagitis, hiatal hernia.  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  Continue PPI BID  Last Colonoscopy in 2018 with few tubular adenomas and sigmoid diverticulosis (no repeat recommended due to age)    Past Medical History:  Diagnosis Date   A-fib [facility])    Atrial flutter (HCC)    On Eliquis in 8/17 but discontinued after stent placement   Breast cancer (HCC)    remote   CAD in native artery 03/16/2016   COPD (chronic obstructive pulmonary disease) (HCC)    Dysrhythmia    Hypertension    Iron deficiency anemia 06/12/2021   NSTEMI (non-ST elevated myocardial infarction) (HCC)    2017   S/P angioplasty with stent 03/15/16 DES Resolute, pLCX 03/16/2016   Vitreous hemorrhage of left eye (HCC) 08/01/2019    Past Surgical History:  Procedure Laterality Date   APPENDECTOMY     BREAST SURGERY Left    CARDIAC CATHETERIZATION N/A 03/15/2016   Procedure: Left Heart Cath and Coronary Angiography;  Surgeon: Lyn Records, MD;  Location: [facility] INVASIVE CV LAB;  Service: Cardiovascular;  Laterality: N/A;   CARDIAC  CATHETERIZATION N/A 03/15/2016   Procedure: Coronary Stent Intervention;  Surgeon: Lyn Records, MD;  Location: [facility] INVASIVE CV LAB;  Service: Cardiovascular;  Laterality: N/A;   CARDIOVERSION N/A 02/04/2023   Procedure: CARDIOVERSION;  Surgeon: Marjo Bicker, MD;  Location: [facility] ORS;  Service: Cardiovascular;   Laterality: N/A;   COLONOSCOPY     remote   COLONOSCOPY N/A 05/26/2016   Procedure: COLONOSCOPY;  Surgeon: Corbin Ade, MD;  Location: [facility] ENDO SUITE;  Service: Endoscopy;  Laterality: N/A;  845   ESOPHAGOGASTRODUODENOSCOPY N/A 05/26/2016   Procedure: ESOPHAGOGASTRODUODENOSCOPY (EGD);  Surgeon: Corbin Ade, MD;  Location: [facility] ENDO SU[facility];  Service: Endoscopy;  Laterality: N/A;   ESOPHAGOGASTRODUODENOSCOPY (EGD) WITH PROPOFOL N/A 11/22/2022   Procedure: ESOPHAGOGASTRODUODENOSCOPY (EGD) WITH PROPOFOL;  Surgeon: Lanelle Bal, DO;  Location: [facility];  Service: Endoscopy;  Laterality: N/A;   EYE SURGERY     EYE SURGERY Left    Dr. Barbaraann Barthel   HOT HEMOSTASIS  11/22/2022   Procedure: HOT HEMOSTASIS (ARGON PLASMA COAGULATION/BICAP);  Surgeon: Lanelle Bal, DO;  Location: [facility];  Service: Endoscopy;;   MALONEY DILATION N/A 05/26/2016   Procedure: Elease Hashimoto DILATION;  Surgeon: Corbin Ade, MD;  Location: [facility];  Service: Endoscopy;  Laterality: N/A;   TEE WITHOUT CARDIOVERSION N/A 02/04/2023   Procedure: TRANSESOPHAGEAL ECHOCARDIOGRAM (TEE);  Surgeon: Marjo Bicker, MD;  Location: [facility] ORS;  Service: Cardiovascular;  Laterality: N/A;   TOOTH EXTRACTION N/A 01/15/2022   Procedure: DENTAL RESTORATION/EXTRACTIONS;  Surgeon: Ocie Doyne, DMD;  Location: [facility] OR;  Service: Oral Surgery;  Laterality: N/A;   VITRECTOMY Left 10/03/2019   Dr. Luciana Axe, Vitrectomy, Focal Laser, Removal of Silicone Oil    Prior to Admission medications   Medication Sig Start Date End Date Taking? Authorizing Provider  acetaminophen (TYLENOL) 500 MG tablet Take 1,000 mg by mouth every 8 (eight) hours as needed for mild pain or moderate pain. 03/21/17   [provider]  amiodarone (PACERONE) 200 MG tablet Take 1 Tablet ( 200 mg ) Daily and none on Sunday 05/04/23   Marinus Maw, MD  apixaban (ELIQUIS) 2.5 MG TABS tablet Take 1 tablet (2.5 mg total) by mouth 2 (two) times daily.  03/11/23 09/07/23  Antoine Poche, MD  buPROPion (WELLBUTRIN XL) 150 MG 24 hr tablet Take 150 mg by mouth daily. 11/18/22   [provider]  Cholecalciferol (VITAMIN D3) 50 MCG (2000 UT) TABS Take 2,000 Units by mouth daily.    [provider]  levothyroxine (SYNTHROID) 50 MCG tablet Take 50 mcg by mouth daily before breakfast.  09/20/19   [provider]  lidocaine (LIDODERM) 5 % Place 1 patch onto the skin daily. Remove & Discard patch within 12 hours or as directed by MD 02/06/23   Sherryll Burger, Pratik D, DO  nitroGLYCERIN (NITROSTAT) 0.4 MG SL tablet Place 0.4 mg under the tongue every 5 (five) minutes as needed for chest pain. 06/09/19   [provider]  rosuvastatin (CRESTOR) 20 MG tablet Take 20 mg by mouth daily.  02/26/17   [provider]  sertraline (ZOLOFT) 25 MG tablet Take 25 mg by mouth daily.    [provider]  [name] 200-62.5-25 MCG/ACT AEPB Inhale 1 puff into the lungs daily. 11/02/22   [provider]    Current Facility-Administered Medications  Medication Dose Route Frequency Provider Last Rate Last Admin   amiodarone (PACERONE) tablet 200 mg  200 mg Oral Once per day on  Monday Tuesday Wednesday Thursday Friday Saturday Elgergawy, Leana Roe, MD       fluticasone furoate-vilanterol (BREO ELLIPTA) 200-25 MCG/ACT 1 puff  1 puff Inhalation Daily Elgergawy, Leana Roe, MD       And   umeclidinium bromide (INCRUSE ELLIPTA) 62.5 MCG/ACT 1 puff  1 puff Inhalation Daily Elgergawy, Leana Roe, MD       hydrALAZINE (APRESOLINE) injection 10 mg  10 mg Intravenous Q6H PRN Mansy, Jan A, MD   10 mg at 06/16/23 0455   levothyroxine (SYNTHROID) tablet 50 mcg  50 mcg Oral Q0600 Elgergawy, Leana Roe, MD   50 mcg at 06/16/23 0542   pantoprazole (PROTONIX) injection 40 mg  40 mg Intravenous Q8H Elgergawy, Leana Roe, MD   40 mg at 06/16/23 0455   Followed by   Melene Muller ON 06/19/2023] pantoprazole (PROTONIX) injection 40 mg  40 mg Intravenous Q12H  Elgergawy, Leana Roe, MD       rosuvastatin (CRESTOR) tablet 20 mg  20 mg Oral QHS Elgergawy, Leana Roe, MD        Allergies as of 06/15/2023 - Review Complete 06/15/2023  Allergen Reaction Noted   Povidone-iodine Other (See Comments) 12/20/2022   Sulfa antibiotics Other (See Comments) 08/02/2013   Sulfamethoxazole Other (See Comments) 03/23/2017    Family History  Problem Relation Age of Onset   Heart disease Mother    COPD Father    Cancer Sister        unknown primary   Cancer Brother        unknown primary   Cancer Brother    Hypertension Son    Colon cancer Neg Hx     Social History   Socioeconomic History   Marital status: Divorced    Spouse name: Not on file   Number of children: 1   Years of education: Not on file   Highest education level: Not on file  Occupational History   Occupation: Retired  Tobacco Use   Smoking status: Former    Current packs/day: 0.00    Types: Cigarettes    Quit date: 12/28/2015    Years since quitting: 7.4    Passive exposure: Past   Smokeless tobacco: Never   Tobacco comments:    Patient quit within the last 10 years  Vaping Use   Vaping status: Never Used  Substance and Sexual Activity   Alcohol use: Never   Drug use: Never   Sexual activity: Not Currently  Other Topics Concern   Not on file  Social History Narrative   ** Merged History Encounter **       Social Drivers of Health   Financial Resource Strain: Low Risk  (11/26/2022)   Overall Financial Resource Strain (CARDIA)    Difficulty of Paying Living Expenses: Not hard at all  Food Insecurity: No Food Insecurity (06/16/2023)   Hunger Vital Sign    Worried About Running Out of Food in the Last Year: Never true    Ran Out of Food in the Last Year: Never true  Transportation Needs: No Transportation Needs (06/16/2023)   PRAPARE - Administrator, Civil Service (Medical): No    Lack of Transportation (Non-Medical): No  Physical Activity: Not on file   Stress: No Stress Concern Present (11/26/2022)   [employer] of Occupational Health - Occupational Stress Questionnaire    Feeling of Stress : Not at all  Social Connections: Unknown (06/16/2023)   Social Connection and Isolation Panel [NHANES]    Frequency of  Communication with Friends and Family: Twice a week    Frequency of Social Gatherings with Friends and Family: Twice a week    Attends Religious Services: 1 to 4 times per year    Active Member of [employer] or Organizations: Yes    Attends Banker Meetings: Never    Marital Status: Patient declined  Intimate Partner Violence: Not At Risk (06/16/2023)   Humiliation, Afraid, Rape, and Kick questionnaire    Fear of Current or Ex-Partner: No    Emotionally Abused: No    Physically Abused: No    Sexually Abused: No     Review of Systems   Gen: + fatigue. Denies any fever, chills, loss of appetite, change in weight or weight loss CV: + Afib. Denies chest pain,  syncope, edema  Resp: + dyspnea on exertion. + baseline dyspnea at rest. Denies cough, wheezing, coughing up blood, and pleurisy. GI: see HPI GU : Denies urinary burning, blood in urine, urinary frequency, and urinary incontinence. Psych: Denies depression, anxiety, memory loss, hallucinations, and confusion. Heme: Denies bruising or bleeding Neuro:  + dizziness, lightheadedness. Denies any headaches, dizziness, paresthesias, shaking  Physical Exam   Vital Signs in last 24 hours: Temp:  [97.6 F (36.4 C)-99 F (37.2 C)] 98 F (36.7 C) (03/13 0805) Pulse Rate:  [76-95] 83 (03/13 0700) Resp:  [14-28] 26 (03/13 0700) BP: (111-165)/(49-86) 158/81 (03/13 0700) SpO2:  [92 %-100 %] 100 % (03/13 0700) Weight:  [62.1 kg-62.8 kg] 62.8 kg (03/12 2201)   General:   Alert,  Well-developed, pleasant and cooperative in NAD Head:  Normocephalic and atraumatic. Eyes:  Sclera clear, no icterus.   Conjunctiva pink. Ears:  Normal auditory acuity. Mouth:  No deformity  or lesions, dentition normal. Neck:  Supple; no masses Lungs:  Clear throughout to auscultation. Mild increased work of breathing. No wheezes, crackles, or rhonchi. No acute distress. Heart:  Regular rate and rhythm; no murmurs, clicks, rubs,  or gallops. Abdomen:  Soft, nontender and nondistended. No masses, hepatosplenomegaly or hernias noted. Normal bowel sounds, without guarding, and without rebound. Rectal: deferred Msk:  Symmetrical without gross deformities. Normal posture. Extremities:  Without clubbing or edema. Neurologic:  Alert and oriented x4. Skin:  Intact without significant lesions or rashes. Psych:  Alert and cooperative. Normal mood and affect.  Intake/Output from previous day: 03/12 0701 - 03/13 0700 In: 1359 [I.V.:50; Blood:1309] Out: 1300 [Urine:1300] Intake/Output this shift: No intake/output data recorded.  Labs/Studies   Recent Labs Recent Labs    06/15/23 1854 06/16/23 0458  WBC 10.4 9.8  HGB 4.8* 8.0*  HCT 16.4* 25.6*  PLT PLATELET CLUMPS NOTED ON SMEAR, COUNT APPEARS DECREASED PLATELET CLUMPS NOTED ON SMEAR, COUNT APPEARS DECREASED   BMET Recent Labs    06/15/23 1854 06/16/23 0458  NA 138 138  K 5.2* 4.2  CL 113* 112*  CO2 16* 19*  GLUCOSE 156* 94  BUN 47* 48*  CREATININE 1.56* 1.43*  CALCIUM 8.2* 7.8*   LFT Recent Labs    06/15/23 1854  PROT 5.7*  ALBUMIN 3.1*  AST 42*  ALT 23  ALKPHOS 49  BILITOT 0.4   PT/INR No results for input(s): "LABPROT", "INR" in the last 72 hours. Hepatitis Panel No results for input(s): "HEPBSAG", "HCVAB", "HEPAIGM", "HEPBIGM" in the last 72 hours. C-Diff No results for input(s): "CDIFFTOX" in the last 72 hours.  Radiology/Studies DG Chest Port 1 View Result Date: 06/15/2023 CLINICAL DATA:  Shortness of breath EXAM: PORTABLE CHEST 1 VIEW COMPARISON:  02/02/2023, CT 09/12/2022 FINDINGS: Hyperinflation with emphysema and diffuse bronchitic changes. No focal airspace disease, pleural effusion or  pneumothorax. Stable cardiomediastinal silhouette with aortic atherosclerosis. No pneumothorax. Clips in the left axilla IMPRESSION: No active disease. Hyperinflation with emphysema and diffuse bronchitic changes. Electronically Signed   By: Jasmine Pang M.D.   On: 06/15/2023 21:56   Assessment   Bonetta Mostek is a 83 y.o. year old female atrial flutter/A-fib chronic Eliquis, IDA CAD, COPD, HTN, breast cancer, CKD previously receiving iron infusion outpatient, NSTEMI with DES in 2017 who presented to the ED at the recommendation of PCP for low hemoglobin, increased shortness of breath, dizziness, and fatigue.  Hemoglobin found to be 4.8.  GI consulted for further evaluation of symptomatic anemia.  Severe symptomatic anemia:  - Presented Hgb 4.8, s/p 3u PRBC, improved to 8 this morning - Stool heme positive in ED - Feeling weak, dizzy, lightheaded since Saturday 3/8 - Maintained on Eliquis for Afib, last dose Saturday 3/8 or Monday 3/10? (awaiting refill on medication) - Prior EGD with single bleeding gastric angiectasia treated with APC therapy and 3 other nonbleeding angioectasias within the stomach and duodenum during prior admission (Aug 2024). - Last Colonoscopy in 2018 with few tubular adenomas and sigmoid diverticulosis (no repeat recommended due to age) - Rechecking Hgb this AM given blood possibly checked while blood running -Differentials include dieulafoy lesion, recurrent angioectasias, colonic angioectasias.  Given prior history, upper GI source most likely.  Plan / Recommendations   Recheck H/H this morning Transfuse hgb <7 PPI IV BID Hold Eliquis NPO EGD today with Dr. Tasia Catchings Potential for inpatient colonoscopy if no source of bleed identified on EGD. Should consider outpatient colonoscopy if not performed inpatient Continue outpatient iron therapy    06/16/2023, 8:30 AM  Patricia Bonito, MSN, FNP-BC, AGACNP-BC [facility]

## 2023-06-17 ENCOUNTER — Encounter (HOSPITAL_COMMUNITY)
Admission: EM | Disposition: A | Discharge status: Home / Self Care | Payer: Self-pay | Source: Home / Self Care | Attending: Internal Medicine

## 2023-06-17 ENCOUNTER — Encounter (HOSPITAL_COMMUNITY): Payer: Self-pay | Admitting: Gastroenterology

## 2023-06-17 ENCOUNTER — Inpatient Hospital Stay (HOSPITAL_COMMUNITY): Admitting: Anesthesiology

## 2023-06-17 DIAGNOSIS — D120 Benign neoplasm of cecum: Secondary | ICD-10-CM | POA: Diagnosis not present

## 2023-06-17 DIAGNOSIS — D123 Benign neoplasm of transverse colon: Secondary | ICD-10-CM | POA: Diagnosis not present

## 2023-06-17 DIAGNOSIS — K31819 Angiodysplasia of stomach and duodenum without bleeding: Secondary | ICD-10-CM | POA: Diagnosis not present

## 2023-06-17 DIAGNOSIS — K5730 Diverticulosis of large intestine without perforation or abscess without bleeding: Secondary | ICD-10-CM

## 2023-06-17 DIAGNOSIS — I480 Paroxysmal atrial fibrillation: Secondary | ICD-10-CM | POA: Diagnosis not present

## 2023-06-17 DIAGNOSIS — E785 Hyperlipidemia, unspecified: Secondary | ICD-10-CM | POA: Diagnosis not present

## 2023-06-17 DIAGNOSIS — D62 Acute posthemorrhagic anemia: Secondary | ICD-10-CM | POA: Diagnosis not present

## 2023-06-17 DIAGNOSIS — K635 Polyp of colon: Secondary | ICD-10-CM | POA: Diagnosis not present

## 2023-06-17 DIAGNOSIS — D649 Anemia, unspecified: Secondary | ICD-10-CM | POA: Diagnosis not present

## 2023-06-17 DIAGNOSIS — I2510 Atherosclerotic heart disease of native coronary artery without angina pectoris: Secondary | ICD-10-CM | POA: Diagnosis not present

## 2023-06-17 HISTORY — PX: HEMOSTASIS CLIP PLACEMENT: SHX6857

## 2023-06-17 HISTORY — PX: POLYPECTOMY: SHX5525

## 2023-06-17 HISTORY — PX: COLONOSCOPY: SHX5424

## 2023-06-17 HISTORY — PX: SCLEROTHERAPY: SHX6841

## 2023-06-17 LAB — BPAM RBC
Blood Product Expiration Date: 202503252359
Blood Product Unit Number: 202503242359
ISSUE DATE / TIME: 202503122136
ISSUE DATE / TIME: 202503130120
ISSUE DATE / TIME: 202503252359
PRODUCT CODE: 202503130448
PRODUCT CODE: 202503252359
Unit Type and Rh: 6200
Unit Type and Rh: 202503242359
Unit Type and Rh: 202503252359
Unit Type and Rh: 6200
Unit Type and Rh: 6200
Unit Type and Rh: 6200

## 2023-06-17 LAB — TYPE AND SCREEN
ABO/RH(D): A POS
Antibody Screen: NEGATIVE
Unit division: 0
Unit division: 0
Unit division: 0

## 2023-06-17 LAB — BASIC METABOLIC PANEL
Anion gap: 8 (ref 5–15)
BUN: 40 mg/dL — ABNORMAL HIGH (ref 8–23)
CO2: 17 mmol/L — ABNORMAL LOW (ref 22–32)
Calcium: 7.3 mg/dL — ABNORMAL LOW (ref 8.9–10.3)
Chloride: 114 mmol/L — ABNORMAL HIGH (ref 98–111)
Creatinine, Ser: 1.24 mg/dL — ABNORMAL HIGH (ref 0.44–1.00)
GFR, Estimated: 44 mL/min — ABNORMAL LOW (ref >60)
Glucose, Bld: 87 mg/dL (ref 70–99)
Potassium: 4.1 mmol/L (ref 3.5–5.1)
Sodium: 139 mmol/L (ref 135–145)

## 2023-06-17 LAB — HEMOGLOBIN AND HEMATOCRIT, BLOOD
HCT: 26.3 % — ABNORMAL LOW (ref 36.0–46.0)
Hemoglobin: 8.4 g/dL — ABNORMAL LOW (ref 12.0–15.0)

## 2023-06-17 SURGERY — COLONOSCOPY
Anesthesia: General

## 2023-06-17 MED ORDER — GLUCAGON HCL RDNA (DIAGNOSTIC) 1 MG IJ SOLR
INTRAMUSCULAR | Status: DC | PRN
  Administered 2023-06-17 (×2): .5 mg via INTRAVENOUS

## 2023-06-17 MED ORDER — LACTATED RINGERS IV SOLN: INTRAVENOUS | Status: DC

## 2023-06-17 MED ORDER — BISACODYL 5 MG PO TBEC
10.0000 mg | DELAYED_RELEASE_TABLET | Freq: Once | ORAL | Status: AC
  Administered 2023-06-17: 10 mg via ORAL
  Filled 2023-06-17: qty 2

## 2023-06-17 MED ORDER — PHENYLEPHRINE 80 MCG/ML (10ML) SYRINGE FOR IV PUSH (FOR BLOOD PRESSURE SUPPORT)
PREFILLED_SYRINGE | INTRAVENOUS | Status: AC
Start: 2023-06-17 — End: ?
  Filled 2023-06-17: qty 10

## 2023-06-17 MED ORDER — PHENYLEPHRINE 80 MCG/ML (10ML) SYRINGE FOR IV PUSH (FOR BLOOD PRESSURE SUPPORT)
PREFILLED_SYRINGE | INTRAVENOUS | Status: DC | PRN
  Administered 2023-06-17 (×4): 80 ug via INTRAVENOUS

## 2023-06-17 MED ORDER — PROPOFOL 10 MG/ML IV BOLUS
INTRAVENOUS | Status: DC | PRN
Start: 2023-06-17 — End: 2023-06-17
  Administered 2023-06-17: 125 ug/kg/min via INTRAVENOUS
  Administered 2023-06-17: 60 mg via INTRAVENOUS

## 2023-06-17 MED ORDER — SPOT INK MARKER SYRINGE KIT
PACK | SUBMUCOSAL | Status: DC | PRN
  Administered 2023-06-17: 1 mL via SUBMUCOSAL

## 2023-06-17 MED ORDER — LINACLOTIDE 145 MCG PO CAPS
290.0000 ug | ORAL_CAPSULE | Freq: Every day | ORAL | Status: DC
  Administered 2023-06-18: 290 ug via ORAL
  Filled 2023-06-17: qty 2

## 2023-06-17 MED ORDER — SPOT INK MARKER SYRINGE KIT
PACK | SUBMUCOSAL | Status: AC
  Filled 2023-06-17: qty 5

## 2023-06-17 MED ORDER — GLUCAGON HCL RDNA (DIAGNOSTIC) 1 MG IJ SOLR
INTRAMUSCULAR | Status: AC
Start: 2023-06-17 — End: ?
  Filled 2023-06-17: qty 1

## 2023-06-17 NOTE — Op Note (Signed)
 [facility] Patient Name: Patricia Jackson Procedure Date: 06/17/2023 1:42 PM MRN: [id number] Date of Birth: April 20, 1940 Attending MD: Gennette Pac , MD, [id number] CSN: [id number] Age: 83 Admit Type: Inpatient Procedure:                Colonoscopy Indications:              Gastrointestinal occult blood loss Providers:                Gennette Pac, MD, Crystal Page, Elinor Parkinson Referring MD:              Medicines:                Propofol per Anesthesia Complications:            No immediate complications. Estimated Blood Loss:     Estimated blood loss was minimal. Procedure:                Pre-Anesthesia Assessment:                           - Prior to the procedure, a History and Physical                            was performed, and patient medications and                            allergies were reviewed. The patient's tolerance of                            previous anesthesia was also reviewed. The risks                            and benefits of the procedure and the sedation                            options and risks were discussed with the patient.                            All questions were answered, and informed consent                            was obtained. Prior Anticoagulants: The patient has                            taken no anticoagulant or antiplatelet agents. ASA                            Grade Assessment: III - A patient with severe                            systemic disease. After reviewing the risks and  benefits, the patient was deemed in satisfactory                            condition to undergo the procedure.                           After obtaining informed consent, the colonoscope                            was passed under direct vision. Throughout the                            procedure, the patient's blood pressure, pulse, and                            oxygen  saturations were monitored continuously. The                            [id number]) scope was introduced through the                            anus and advanced to the the cecum, identified by                            appendiceal orifice and ileocecal valve. The                            colonoscopy was performed without difficulty. The                            patient tolerated the procedure well. The quality                            of the bowel preparation was poor. The ileocecal                            valve, appendiceal orifice, and rectum were                            photographed. The colonoscopy was performed without                            difficulty. The entire colon was examined. Scope In: 2:15:02 PM Scope Out: 3:18:43 PM Scope Withdrawal Time: 0 hours 47 minutes 23 seconds  Total Procedure Duration: 1 hour 3 minutes 41 seconds  Findings:      The perianal and digital rectal examinations were normal. In spite of       quite a bit of additional prep given, cleanout was marginal. It was a       doable exam, however.      Many medium-mouthed diverticula were found in the sigmoid colon and       descending colon.      High polyp burden in the right colon.      Two sessile polyps were found in the hepatic flexure. The polyps  were 8       to 9 mm in size. These polyps were removed with a hot snare. Resection       and retrieval were complete. Estimated blood loss: none.      Two semi-pedunculated polyps were found in the hepatic flexure. The       polyps were 4 to 6 mm in size. These polyps were removed with a cold       snare. Resection and retrieval were complete. Estimated blood loss was       minimal.      Six semi-pedunculated polyps were found in the ileocecal valve. The       polyps were 8 to 30 mm in size. These polyps were removed with a hot       snare. Resection and retrieval were complete. Estimated blood loss:       none. The largest hot snare  hepatic flexure polyp had dimensions of       approximately 30 mm x 15 mm. Multilobulated on a fold just distal to the       ileocecal valve. 7 cc of Ella view -lifted nicely away from the colonic       wall. Subsequently using the 30 mm snare this polyp was removed was       essentially removed with 1 pass the polypectomy margin was ablated with       the tip of the hot snare. Subsequently, this polypectomy site was closed       with 4 resolution clips. Please see photos. Location of polypectomy       inked just distal to the site. Impression:               - Preparation of the colon was poor.                           - Diverticulosis in the sigmoid colon and in the                            descending colon.                           - Two 8 to 9 mm polyps at the hepatic flexure,                            removed with a hot snare. Resected and retrieved.                           - Two 4 to 6 mm polyps at the hepatic flexure,                            removed with a cold snare. Resected and retrieved.                           - Six 8 to 30 mm polyps at the ileocecal valve,                            removed with a hot snare. Resected and retrieved.  15 x 30 mm polyp just distal to the ileocecal valve                            treated as described above. Largest polypectomy                            site in the ascending colon demarcated with ink.                           -Please note the prep was inadequate today. Smaller                            sessile lesions may have been missed. At a minimum,                            she will need an early follow-up colonoscopy in 6                            months with an aggressive preparation Moderate Sedation:      Moderate (conscious) sedation was personally administered by an       anesthesia professional. The following parameters were monitored: oxygen       saturation, heart rate, blood pressure,  respiratory rate, EKG, adequacy       of pulmonary ventilation, and response to care. Recommendation:           - Return patient to hospital ward for observation.                           - Repeat colonoscopy in 6 months for surveillance                            depending on pathology grade.                           -May resume Eliquis no sooner than Monday, March                            24. At patient request, I called Marylou Mccoy,                            son, at [phone] that answered said that                            that was an incorrect number Procedure Code(s):        --- Professional ---                           [phone], Colonoscopy, flexible; with removal of                            tumor(s), polyp(s), or other lesion(s) by snare                            technique  Diagnosis Code(s):        --- Professional ---                           D12.3, Benign neoplasm of transverse colon (hepatic                            flexure or splenic flexure)                           D12.0, Benign neoplasm of cecum                           R19.5, Other fecal abnormalities                           K57.30, Diverticulosis of large intestine without                            perforation or abscess without bleeding CPT copyright 2022 American Medical Association. All rights reserved. The codes documented in this report are preliminary and upon coder review may  be revised to meet current compliance requirements. Gerrit Friends. Kory Rains, MD Gennette Pac, MD 06/17/2023 3:46:26 PM This report has been signed electronically. Number of Addenda: 0

## 2023-06-17 NOTE — Progress Notes (Addendum)
 Patient has been stable this shift.  Patient has completed over half of colonoscopy prep and has had 2 tap water enemas without change in stool. Stool is very dark, liquid, with small chunks.  Vitals have been stable. Patient did express interest in receiving medication to help her sleep tonight if she was still in hospital.

## 2023-06-17 NOTE — Anesthesia Preprocedure Evaluation (Signed)
 Anesthesia Evaluation  Patient identified by MRN, date of birth, ID band Patient awake    Reviewed: Allergy & Precautions, H&P , NPO status , Patient's Chart, lab work & pertinent test results, reviewed documented beta blocker date and time   Airway Mallampati: II  TM Distance: >3 FB Neck ROM: full    Dental no notable dental hx.    Pulmonary neg pulmonary ROS, pneumonia, COPD, former smoker   Pulmonary exam normal breath sounds clear to auscultation       Cardiovascular Exercise Tolerance: Good hypertension, + CAD, + Past MI and + DOE  negative cardio ROS + dysrhythmias Atrial Fibrillation  Rhythm:regular Rate:Normal     Neuro/Psych  PSYCHIATRIC DISORDERS Anxiety Depression    negative neurological ROS  negative psych ROS   GI/Hepatic negative GI ROS, Neg liver ROS,GERD  ,,  Endo/Other  negative endocrine ROSHypothyroidism    Renal/GU Renal diseasenegative Renal ROS  negative genitourinary   Musculoskeletal   Abdominal   Peds  Hematology negative hematology ROS (+) Blood dyscrasia, anemia   Anesthesia Other Findings   Reproductive/Obstetrics negative OB ROS                             Anesthesia Physical Anesthesia Plan  ASA: 3  Anesthesia Plan: General   Post-op Pain Management:    Induction:   PONV Risk Score and Plan: Propofol infusion  Airway Management Planned:   Additional Equipment:   Intra-op Plan:   Post-operative Plan:   Informed Consent: I have reviewed the patients History and Physical, chart, labs and discussed the procedure including the risks, benefits and alternatives for the proposed anesthesia with the patient or authorized representative who has indicated his/her understanding and acceptance.     Dental Advisory Given  Plan Discussed with: CRNA  Anesthesia Plan Comments:        Anesthesia Quick Evaluation

## 2023-06-17 NOTE — Transfer of Care (Signed)
 Immediate Anesthesia Transfer of Care Note  Patient: Patricia Jackson  Procedure(s) Performed: COLONOSCOPY SCLEROTHERAPY CONTROL OF HEMORRHAGE, GI TRACT, ENDOSCOPIC, BY CLIPPING OR OVERSEWING TATTOOING, USING INTRADERMAL PIGMENT POLYPECTOMY  Patient Location: PACU  Anesthesia Type:General  Level of Consciousness: drowsy  Airway & Oxygen Therapy: Patient Spontanous Breathing  Post-op Assessment: Report given to RN and Post -op Vital signs reviewed and stable  Post vital signs: Reviewed and stable  Last Vitals:  Vitals Value Taken Time  BP 100/39 06/17/23 1527  Temp 36.7 C 06/17/23 1525  Pulse 62 06/17/23 1528  Resp 28 06/17/23 1528  SpO2 96 % 06/17/23 1528  Vitals shown include unfiled device data.  Last Pain:  Vitals:   06/17/23 1409  TempSrc:   PainSc: 0-No pain         Complications: No notable events documented.

## 2023-06-17 NOTE — Anesthesia Postprocedure Evaluation (Signed)
 Anesthesia Post Note  Patient: Patricia Jackson  Procedure(s) Performed: EGD (ESOPHAGOGASTRODUODENOSCOPY) EGD, WITH ARGON PLASMA COAGULATION  Patient location during evaluation: Phase II Anesthesia Type: General Level of consciousness: awake Pain management: pain level controlled Vital Signs Assessment: post-procedure vital signs reviewed and stable Respiratory status: spontaneous breathing and respiratory function stable Cardiovascular status: blood pressure returned to baseline and stable Postop Assessment: no headache and no apparent nausea or vomiting Anesthetic complications: no Comments: Late entry   No notable events documented.   Last Vitals:  Vitals:   06/17/23 0820 06/17/23 1356  BP:  (!) 148/89  Pulse:    Resp: (!) 23 (!) 21  Temp:  36.4 C  SpO2:  100%    Last Pain:  Vitals:   06/17/23 1409  TempSrc:   PainSc: 0-No pain                 [name]

## 2023-06-17 NOTE — Progress Notes (Signed)
 Progress Note   Patient: Patricia Jackson WUJ:[id number] DOB: [birth year] DOA: 06/15/2023     2 DOS: the patient was seen and examined on 06/17/2023   Brief hospital admission narrative: As per H&P written by Dr. Randol Kern on 06/15/2023 Patricia Jackson  is a 83 y.o. 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 -Patient presents to ED for low hemoglobin, she went to PCP office due to complaints of shortness of breath, fatigue, generalized weakness, had normal labs done, where hemoglobin came back significantly low for which she was called and instructed to come back to ED for blood transfusions, she denies any chest pain, fever, chills, patient reports last Eliquis was on Monday morning, as she ran out of Eliquis and she forgot to tell her niece to pick it up for her, niece at bedside and confirmed that, otherwise she has been compliant with her medications  The patient had an upper endoscopy in August 2024 which showed a 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    -In ED hemoglobin was low at 4.8, she had melena, Hemoccult positive, she was altered blood transfusions, ED discussed with GI who recommended admission for PRBC transfusion.  Assessment and Plan: 1-acute blood loss anemia -Hemoglobin 4.8 at time of admission -Status post 3 units PRBC transfusion with repeat hemoglobin around 8.4 -There was concern for patient's blood work this morning being done to close after transfusion -Planning to repeat another CBC to follow through with stability -Continue avoiding NSAIDs, Eliquis or heparin products. 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 -Continue n.p.o. status for  anticipated colonoscopy and follow further recommendations.  2-paroxysmal atrial fibrillation -Continue treatment with amiodarone -Eliquis on hold secondary to problem #1 -Continue telemetry monitoring and maintain adequate levels of electrolytes.  3-hyperkalemia -Present at time of admission -Resolved after receiving Lokelma -Continue telemetry monitoring.  4-prolonged QTc -Holding medications that can further prolong QT at the moment -Replete electrolytes and follow telemetry evaluation  5-chronic kidney disease stage IV -Renal function appears to be stable and at baseline -Minimize nephrotoxic agents and avoid hypotension -Follow renal function trend.  6-coronary artery disease status post drug-eluting stent to left circumflex in 2017 -Continue Refacto modifications -Continue statin -Continue patient follow-up with cardiology service.  7-hypothyroidism -Continue Synthroid.  8-hypertension -Blood pressure stable -Holding antihypertensive agents at the moment -Follow vital signs.  9-history of COPD -No acute exacerbation currently appreciated -Continue as needed bronchodilator -Continue treatment with Breo and Incruse Ellipta.  10-anxiety -Planning to resume home Wellbutrin and sertraline once stabilization of QT achieved.   Subjective:  No chest pain, no nausea, no vomiting.  Overall feeling better.  No shortness of breath.  Physical Exam: Vitals:   06/17/23 1526 06/17/23 1527 06/17/23 1530 06/17/23 1614  BP: (!) 83/56 (!) 100/39 (!) 110/47 (!) 121/55  Pulse:  63 62   Resp:  (!) 22 (!) 21 20  Temp:    98.3 F (36.8 C)  TempSrc:    Oral  SpO2:  97% 99% 100%  Weight:      Height:       General exam: Alert, awake, oriented x 3; no chest pain, no nausea, no vomiting.  Patient receiving bowel preparation for anticipated colonoscopy later on.  Still having multiple bowel movements  with black color stool. Respiratory system: No using accessory muscles; good  saturation on room air. Cardiovascular system:Rate controlled. No rubs or gallops; no JVD. Gastrointestinal system: Abdomen is nondistended, soft and nontender. No organomegaly or masses felt. Normal bowel sounds heard. Central nervous system: No focal neurological deficits. Extremities: No cyanosis or clubbing. Skin: No petechiae. Psychiatry: Judgement and insight appear normal. Mood & affect appropriate.   Data Reviewed: H&H: Hemoglobin 8.4; hematocrit 26.3 Base metabolic panel: Sodium 139, potassium 4.1, chloride 114, bicarb 17, BUN 40, creatinine 1.24 and GFR 44  Family Communication: Daughter at bedside.  Disposition: Status is: Inpatient Remains inpatient appropriate because: Continue workup for GI bleed and further transfusion as needed.   Planned Discharge Destination: Home  Time spent: 50 minutes  Author: Vassie Loll, MD 06/17/2023 5:26 PM  For on call review [email or web address].

## 2023-06-17 NOTE — Interval H&P Note (Signed)
 History and Physical Interval Note:  06/17/2023 1:59 PM  Patricia Jackson  has presented today for surgery, with the diagnosis of severe symptomatic anemia.  The various methods of treatment have been discussed with the patient and family. After consideration of risks, benefits and other options for treatment, the patient has consented to  Procedure(s): COLONOSCOPY (N/A) as a surgical intervention.  The patient's history has been reviewed, patient examined, no change in status, stable for surgery.  I have reviewed the patient's chart and labs.  Questions were answered to the patient's satisfaction.     [name]  Patient seen in short stay.  No interval change.  Hemoglobin 8.4 this a.m. after 3 units transfused this admission  Remains hemodynamically stable. No active bleeding.  Prolonged prep today needed.  AVMs ablated as per EGD findings yesterday.  I agree with updating her colonoscopy this afternoon.  The risks, benefits, limitations, alternatives and imponderables have been reviewed with the patient. Questions have been answered. All parties are agreeable.

## 2023-06-17 NOTE — Care Management Important Message (Signed)
 Important Message  Patient Details  Name: Patricia Jackson MRN: [id number] Date of Birth: [birth year]   Important Message Given:  Yes - Medicare IM     Corey Harold 06/17/2023, 11:51 AM

## 2023-06-18 DIAGNOSIS — E785 Hyperlipidemia, unspecified: Secondary | ICD-10-CM | POA: Diagnosis not present

## 2023-06-18 DIAGNOSIS — J449 Chronic obstructive pulmonary disease, unspecified: Secondary | ICD-10-CM | POA: Diagnosis not present

## 2023-06-18 DIAGNOSIS — D62 Acute posthemorrhagic anemia: Secondary | ICD-10-CM | POA: Diagnosis not present

## 2023-06-18 DIAGNOSIS — E039 Hypothyroidism, unspecified: Secondary | ICD-10-CM | POA: Diagnosis not present

## 2023-06-18 DIAGNOSIS — K31819 Angiodysplasia of stomach and duodenum without bleeding: Secondary | ICD-10-CM

## 2023-06-18 LAB — CBC
HCT: 28.2 % — ABNORMAL LOW (ref 36.0–46.0)
Hemoglobin: 8.8 g/dL — ABNORMAL LOW (ref 12.0–15.0)
MCH: 28.9 pg (ref 26.0–34.0)
MCHC: 31.2 g/dL (ref 30.0–36.0)
MCV: 92.8 fL (ref 80.0–100.0)
Platelets: DECREASED 10*3/uL (ref 150–400)
RBC: 3.04 MIL/uL — ABNORMAL LOW (ref 3.87–5.11)
RDW: 19.5 % — ABNORMAL HIGH (ref 11.5–15.5)
WBC: 13.3 10*3/uL — ABNORMAL HIGH (ref 4.0–10.5)
nRBC: 0 % (ref 0.0–0.2)

## 2023-06-18 LAB — BASIC METABOLIC PANEL
Anion gap: 8 (ref 5–15)
BUN: 23 mg/dL (ref 8–23)
CO2: 15 mmol/L — ABNORMAL LOW (ref 22–32)
Calcium: 7.4 mg/dL — ABNORMAL LOW (ref 8.9–10.3)
Chloride: 114 mmol/L — ABNORMAL HIGH (ref 98–111)
Creatinine, Ser: 1.3 mg/dL — ABNORMAL HIGH (ref 0.44–1.00)
GFR, Estimated: 42 mL/min — ABNORMAL LOW (ref >60)
Glucose, Bld: 93 mg/dL (ref 70–99)
Potassium: 4 mmol/L (ref 3.5–5.1)
Sodium: 137 mmol/L (ref 135–145)

## 2023-06-18 MED ORDER — LINACLOTIDE 290 MCG PO CAPS
290.0000 ug | ORAL_CAPSULE | Freq: Every day | ORAL | 1 refills | Status: DC
Start: 2023-06-19 — End: 2024-01-27

## 2023-06-18 MED ORDER — APIXABAN 2.5 MG PO TABS: 2.5000 mg | ORAL_TABLET | Freq: Two times a day (BID) | ORAL | Status: DC

## 2023-06-18 MED ORDER — PNEUMOCOCCAL 20-VAL CONJ VACC 0.5 ML IM SUSY
0.5000 mL | PREFILLED_SYRINGE | Freq: Once | INTRAMUSCULAR | Status: AC
  Administered 2023-06-18: 0.5 mL via INTRAMUSCULAR
  Filled 2023-06-18: qty 0.5

## 2023-06-18 MED ORDER — PANTOPRAZOLE SODIUM 40 MG PO TBEC: 40.0000 mg | DELAYED_RELEASE_TABLET | Freq: Every day | ORAL | 1 refills | Status: DC

## 2023-06-18 NOTE — Anesthesia Postprocedure Evaluation (Signed)
 Anesthesia Post Note  Patient: Patricia Jackson  Procedure(s) Performed: COLONOSCOPY SCLEROTHERAPY CONTROL OF HEMORRHAGE, GI TRACT, ENDOSCOPIC, BY CLIPPING OR OVERSEWING TATTOOING, USING INTRADERMAL PIGMENT POLYPECTOMY  Patient location during evaluation: Phase II Anesthesia Type: General Level of consciousness: awake Pain management: pain level controlled Vital Signs Assessment: post-procedure vital signs reviewed and stable Respiratory status: spontaneous breathing and respiratory function stable Cardiovascular status: blood pressure returned to baseline and stable Postop Assessment: no headache and no apparent nausea or vomiting Anesthetic complications: no Comments: Late entry   No notable events documented.   Last Vitals:  Vitals:   06/18/23 0746 06/18/23 0836  BP:  (!) 127/53  Pulse:  92  Resp:  (!) 21  Temp:  37.2 C  SpO2: 97% 98%    Last Pain:  Vitals:   06/18/23 0842  TempSrc:   PainSc: 0-No pain                 [name]

## 2023-06-18 NOTE — Progress Notes (Signed)
 Patient discharged home. All questions and concerns answered. Patient has all personal belongings, including discharge paperwork. Patient left floor via wheelchair to POV.

## 2023-06-18 NOTE — Progress Notes (Signed)
 She reports a good night.  Eating regular food no melena or hematochezia.  Denies abdominal pain. Hemoglobin stable at 8 8 this morning  Vital signs in last 24 hours: Temp:  [97.6 F (36.4 C)-100 F (37.8 C)] 98.9 F (37.2 C) (03/15 0836) Pulse Rate:  [58-93] 92 (03/15 0836) Resp:  [20-24] 21 (03/15 0836) BP: (83-148)/(39-89) 127/53 (03/15 0836) SpO2:  [96 %-100 %] 98 % (03/15 0836) Weight:  [62.4 kg] 62.4 kg (03/14 1358) Last BM Date : 06/17/23 General:   Alert, well-appearing pleasant and cooperative in NAD Abdomen:  S soft and nontender    Intake/Output from previous day: 03/14 0701 - 03/15 0700 In: 860 [P.O.:360; I.V.:500] Out: -  Intake/Output this shift: No intake/output data recorded.  Lab Results: Recent Labs    06/15/23 1854 06/16/23 0458 06/16/23 1123 06/17/23 0440 06/18/23 0807  WBC 10.4 9.8  --   --  13.3*  HGB 4.8* 8.0* 9.4* 8.4* 8.8*  HCT 16.4* 25.6* 29.2* 26.3* 28.2*  PLT PLATELET CLUMPS NOTED ON SMEAR, COUNT APPEARS DECREASED PLATELET CLUMPS NOTED ON SMEAR, COUNT APPEARS DECREASED  --   --  PLATELET CLUMPS NOTED ON SMEAR, COUNT APPEARS DECREASED   BMET Recent Labs    06/16/23 0458 06/17/23 0440 06/18/23 0807  NA 138 139 137  K 4.2 4.1 4.0  CL 112* 114* 114*  CO2 19* 17* 15*  GLUCOSE 94 87 93  BUN 48* 40* 23  CREATININE 1.43* 1.24* 1.30*  CALCIUM 7.8* 7.3* 7.4*   LFT Recent Labs    06/15/23 1854  PROT 5.7*  ALBUMIN 3.1*  AST 42*  ALT 23  ALKPHOS 49  BILITOT 0.4   PT/INR No results for input(s): "LABPROT", "INR" in the last 72 hours. Hepatitis Panel No results for input(s): "HEPBSAG", "HCVAB", "HEPAIGM", "HEPBIGM" in the last 72 hours. C-Diff No results for input(s): "CDIFFTOX" in the last 72 hours.  Studies/Results: No results found.  Impression: Elderly frail lady with multiple comorbidities including atrial fibrillation on Eliquis presents with profound anemia with a hemoglobin in the 4 range.  She has been transfused and has  remained .  Hemodynamically stable; EGD 2 days ago demonstrated gastric duodenal AVMs which were ablated.  Because of the degree of anemia, we proceeded with a colonoscopy yesterday.  She had a large polyp burden in the ascending segment.  She had 1 very large polyp which was removed  - sealed the polypectomy site with clips. Etiology of GI bleed in the setting of anticoagulation likely met multifactorial in etiology.  Certainly, she could have more occult AVMs in her small bowel.  Clinically not bleeding and she remained stable this morning.  Recommendations:  -Daily PPI  -Track H&H as an outpatient  -Do not resume Eliquis prior to 3/24  -At a minimum, patient will need a repeat colonoscopy in 6 months to reassess colon as prep was marginal and a very large polyp was removed yesterday.  -From a GI standpoint, patient could be discharged anytime within the next 24 hours.

## 2023-06-18 NOTE — Discharge Summary (Signed)
 Physician Discharge Summary   Patient: Patricia Jackson MRN: [id number] DOB: 01-Apr-1941  Admit date:     06/15/2023  Discharge date: 06/18/23  Discharge Physician: Vassie Loll   PCP: Benita Stabile, MD   Recommendations at discharge:  Repeat CBC to follow hemoglobin trend/stability Repeat basic metabolic panel to follow ultralights and renal function Patient patient follow-up with gastroenterology service as instructed.  Discharge Diagnoses: Principal Problem:   Acute blood loss anemia Active Problems:   Hyperlipidemia   Atrial fibrillation (HCC)   Chronic obstructive pulmonary disease (HCC)   Acquired hypothyroidism   CAP (community acquired pneumonia)   Angiectasia of gastrointestinal tract   Severe anemia  Brief hospital admission narrative: As per H&P written by Dr. Randol Kern on 06/15/2023 Patricia Jackson  is a 83 y.o. 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 -Patient presents to ED for low hemoglobin, she went to PCP office due to complaints of shortness of breath, fatigue, generalized weakness, had normal labs done, where hemoglobin came back significantly low for which she was called and instructed to come back to ED for blood transfusions, she denies any chest pain, fever, chills, patient reports last Eliquis was on Monday morning, as she ran out of Eliquis and she forgot to tell her niece to pick it up for her, niece at bedside and confirmed that, otherwise she has been compliant with her medications  The patient had an upper endoscopy in August 2024 which showed a 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    -In ED hemoglobin was low at 4.8, she had melena, Hemoccult positive, she was altered blood transfusions,  ED discussed with GI who recommended admission for PRBC transfusion.   Assessment and Plan: 1-acute blood loss anemia -Hemoglobin 4.8 at time of admission -Status post 3 units PRBC transfusion with repeat hemoglobin around 8.4 -There was concern for patient's blood work this morning being done to close after transfusion -Continue avoiding NSAIDs, Eliquis or heparin products. 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 -Colonoscopy demonstrating some diverticula and polyps who has been resected -Patient to follow-up with GI for repeat colonoscopy in 6 months as bowel preparation was suboptimal. -Eliquis to be resumed on 06/27/2023 following GI service clearance recommendation   2-paroxysmal atrial fibrillation -Continue treatment with amiodarone -Eliquis on hold secondary to problem #1 -Continue telemetry monitoring and maintain adequate levels of electrolytes.   3-hyperkalemia -Present at time of admission -Resolved after receiving Lokelma -Continue telemetry monitoring.   4-prolonged QTc -Holding medications that can further prolong QT at the moment -Replete electrolytes and follow telemetry evaluation   5-chronic kidney disease stage IV -Renal function appears to be stable and at baseline -Minimize nephrotoxic agents and avoid hypotension -Follow renal function trend.   6-coronary artery disease status post drug-eluting stent to left circumflex in 2017 -Continue risk factors modifications -Continue statin -Continue outpatient follow-up with cardiology service.   7-hypothyroidism -Continue Synthroid.   8-hypertension -Blood pressure stable -Resume home antihypertensive agents -Heart healthy/low-sodium diet discussed with patient.   9-history of COPD -No acute exacerbation currently appreciated -Continue as needed bronchodilator -Continue treatment with Breo and Incruse Ellipta. -Pneumonia vaccine requested and  provided.   10-anxiety -Planning to resume home Wellbutrin and sertraline   Consultants: GI service Procedures  performed: Endoscopy/colonoscopy; see below for x-ray reports. Disposition: Home Diet recommendation: Heart healthy diet.  DISCHARGE MEDICATION: Allergies as of 06/18/2023       Reactions   Povidone-iodine Other (See Comments)   Burning   Sulfa Antibiotics Other (See Comments)   Unknown reaction    Sulfamethoxazole Other (See Comments)   unknown        Medication List     TAKE these medications    acetaminophen 500 MG tablet Commonly known as: TYLENOL Take 1,000 mg by mouth every 8 (eight) hours as needed for mild pain or moderate pain.   amiodarone 200 MG tablet Commonly known as: PACERONE Take 1 Tablet ( 200 mg ) Daily and none on Sunday   apixaban 2.5 MG Tabs tablet Commonly known as: ELIQUIS Take 1 tablet (2.5 mg total) by mouth 2 (two) times daily. Start taking on: June 27, 2023 What changed: These instructions start on June 27, 2023. If you are unsure what to do until then, ask your doctor or other care provider.   buPROPion 150 MG 24 hr tablet Commonly known as: WELLBUTRIN XL Take 150 mg by mouth daily.   levothyroxine 50 MCG tablet Commonly known as: SYNTHROID Take 50 mcg by mouth daily before breakfast.   linaclotide 290 MCG Caps capsule Commonly known as: LINZESS Take 1 capsule (290 mcg total) by mouth daily before breakfast. Start taking on: June 19, 2023   nitroGLYCERIN 0.4 MG SL tablet Commonly known as: NITROSTAT Place 0.4 mg under the tongue every 5 (five) minutes as needed for chest pain.   pantoprazole 40 MG tablet Commonly known as: Protonix Take 1 tablet (40 mg total) by mouth daily.   rosuvastatin 20 MG tablet Commonly known as: CRESTOR Take 20 mg by mouth daily.   sertraline 25 MG tablet Commonly known as: ZOLOFT Take 25 mg by mouth daily.   Trelegy Ellipta 200-62.5-25 MCG/ACT Aepb Generic drug:  Fluticasone-Umeclidin-Vilant Inhale 1 puff into the lungs daily.   Vitamin D3 50 MCG (2000 UT) Tabs Take 2,000 Units by mouth daily.        Follow-up Information     Benita Stabile, MD. Schedule an appointment as soon as possible for a visit in 10 day(s).   Specialty: Internal Medicine Contact information: [address] [place] [address] [phone]                Discharge Exam: Filed Weights   06/16/23 1236 06/16/23 2110 06/17/23 1358  Weight: 62.8 kg 62.4 kg 62.4 kg   General exam: Alert, awake, oriented x 3; no chest pain, no nausea, no vomiting, no shortness of breath denies any lightheadedness.  Patient expressed no overt bleeding and feeling ready for discharge. Respiratory system: No using accessory muscles; good saturation on room air. Cardiovascular system:Rate controlled. No rubs or gallops; no JVD. Gastrointestinal system: Abdomen is nondistended, soft and nontender. No organomegaly or masses felt. Normal bowel sounds heard. Central nervous system: No focal neurological deficits. Extremities: No cyanosis or clubbing. Skin: No petechiae. Psychiatry: Judgement and insight appear normal. Mood & affect appropriate.   Condition at discharge: Stable and improved.  The results of significant diagnostics from this hospitalization (including imaging, microbiology, ancillary and laboratory) are listed below for reference.   Imaging Studies: DG Chest Port 1 View Result Date: 06/15/2023 CLINICAL DATA:  Shortness of breath EXAM: PORTABLE CHEST 1 VIEW COMPARISON:  02/02/2023, CT 09/12/2022 FINDINGS: Hyperinflation with emphysema and diffuse bronchitic changes. No focal airspace disease, pleural effusion  or pneumothorax. Stable cardiomediastinal silhouette with aortic atherosclerosis. No pneumothorax. Clips in the left axilla IMPRESSION: No active disease. Hyperinflation with emphysema and diffuse bronchitic changes. Electronically Signed   By: Jasmine Pang M.D.    On: 06/15/2023 21:56    Microbiology: Results for orders placed or performed during the hospital encounter of 06/15/23  MRSA Next Gen by PCR, Nasal     Status: None   Collection Time: 06/15/23 11:45 PM   Specimen: Nasal Mucosa; Nasal Swab  Result Value Ref Range Status   MRSA by PCR Next Gen NOT DETECTED NOT DETECTED Final    Comment: (NOTE) The GeneXpert MRSA Assay (FDA approved for NASAL specimens only), is one component of a comprehensive MRSA colonization surveillance program. It is not intended to diagnose MRSA infection nor to guide or monitor treatment for MRSA infections. Test performance is not FDA approved in patients less than 36 years old. Performed at [facility], [address], [place], [place] [address]     Labs: CBC: Recent Labs  Lab 06/15/23 1854 06/16/23 0458 06/16/23 1123 06/17/23 0440 06/18/23 0807  WBC 10.4 9.8  --   --  13.3*  HGB 4.8* 8.0* 9.4* 8.4* 8.8*  HCT 16.4* 25.6* 29.2* 26.3* 28.2*  MCV 100.0 90.1  --   --  92.8  PLT PLATELET CLUMPS NOTED ON SMEAR, COUNT APPEARS DECREASED PLATELET CLUMPS NOTED ON SMEAR, COUNT APPEARS DECREASED  --   --  PLATELET CLUMPS NOTED ON SMEAR, COUNT APPEARS DECREASED   Basic Metabolic Panel: Recent Labs  Lab 06/15/23 1854 06/16/23 0458 06/17/23 0440 06/18/23 0807  NA 138 138 139 137  K 5.2* 4.2 4.1 4.0  CL 113* 112* 114* 114*  CO2 16* 19* 17* 15*  GLUCOSE 156* 94 87 93  BUN 47* 48* 40* 23  CREATININE 1.56* 1.43* 1.24* 1.30*  CALCIUM 8.2* 7.8* 7.3* 7.4*  MG 2.3  --   --   --    Liver Function Tests: Recent Labs  Lab 06/15/23 1854  AST 42*  ALT 23  ALKPHOS 49  BILITOT 0.4  PROT 5.7*  ALBUMIN 3.1*   CBG: No results for input(s): "GLUCAP" in the last 168 hours.  Discharge time spent: greater than 30 minutes.  Signed: Vassie Loll, MD [facility] 06/18/2023

## 2023-06-20 ENCOUNTER — Telehealth: Payer: Self-pay

## 2023-06-20 ENCOUNTER — Encounter (HOSPITAL_COMMUNITY): Payer: Self-pay | Admitting: Internal Medicine

## 2023-06-20 NOTE — Transitions of Care (Post Inpatient/ED Visit) (Signed)
 06/20/2023  Name: Patricia Jackson MRN: [id number] DOB: 12/03/1940  Today's TOC FU Call Status: Today's TOC FU Call Status:: Successful TOC FU Call Completed TOC FU Call Complete Date: 06/20/23 Patient's Name and Date of Birth confirmed.  Transition Care Management Follow-up Telephone Call Date of Discharge: 06/19/23 [facility] Type of Discharge: Inpatient Admission Primary Inpatient Discharge Diagnosis:: Acute blood loss anemia  (hemoglobin came back significantly low) How have you been since you were released from the hospital?: Better Any questions or concerns?: No  Items Reviewed: Did you receive and understand the discharge instructions provided?: Yes Medications obtained,verified, and reconciled?: Yes (Medications Reviewed) (Medication reconciliation completed based on recent discharge summary Patient taking medications as instructed and is aware of any changes or dosage adjustments medication regimen. Patient denies questions and reports no barriers to medication adherence) Any new allergies since your discharge?: No Dietary orders reviewed?: Yes Type of Diet Ordered:: Reg Heart Healthy Do you have support at home?: Yes People in Home: grandchild(ren), other relative(s) Name of Support/Comfort Primary Source: [name] his GF and their children ,    Niece Patricia Jackson  Medications Reviewed Today: Medications Reviewed Today     Reviewed by Patricia Barrios, RN (Registered Nurse) on 06/20/23 at 1235  Med List Status: <None>   Medication Order Taking? Sig Documenting Provider Last Dose Status Informant  acetaminophen (TYLENOL) 500 MG tablet [id number] Yes Take 1,000 mg by mouth every 8 (eight) hours as needed for mild pain or moderate pain. [provider] Taking Active Self  amiodarone (PACERONE) 200 MG tablet [id number] Yes Take 1 Tablet ( 200 mg ) Daily and none on Sunday Patricia Maw, MD Taking Active Self  apixaban (ELIQUIS) 2.5  MG TABS tablet [id number] No Take 1 tablet (2.5 mg total) by mouth 2 (two) times daily.  Patient not taking: Reported on 06/20/2023   Vassie Loll, MD Not Taking Active            Med Note Patricia Jackson Jun 20, 2023 12:35 PM) Start taking on: June 27, 2023 What changed: These instructions start on June 27, 2023  buPROPion (WELLBUTRIN XL) 150 MG 24 hr tablet [id number] Yes Take 150 mg by mouth daily. [provider] Taking Active Self  Cholecalciferol (VITAMIN D3) 50 MCG (2000 UT) TABS [id number] Yes Take 2,000 Units by mouth daily. [provider] Taking Active Self  levothyroxine (SYNTHROID) 50 MCG tablet [id number] Yes Take 50 mcg by mouth daily before breakfast.  [provider] Taking Active Self  linaclotide (LINZESS) 290 MCG CAPS capsule [id number] Yes Take 1 capsule (290 mcg total) by mouth daily before breakfast. Vassie Loll, MD Taking Active   nitroGLYCERIN (NITROSTAT) 0.4 MG SL tablet [id number] Yes Place 0.4 mg under the tongue every 5 (five) minutes as needed for chest pain. [provider] Taking Active Self  pantoprazole (PROTONIX) 40 MG tablet [id number] Yes Take 1 tablet (40 mg total) by mouth daily. Vassie Loll, MD Taking Active   rosuvastatin (CRESTOR) 20 MG tablet [id number] Yes Take 20 mg by mouth daily.  [provider] Taking Active Self  sertraline (ZOLOFT) 25 MG tablet [id number] Yes Take 25 mg by mouth daily. [provider] Taking Active Self  Patricia Jackson 200-62.5-25 MCG/ACT AEPB [id number] Yes Inhale 1 puff into the lungs daily. [provider] Taking Active Self          Medication reconciliation / review completed based on most recent discharge summary and EHR  medication list. Confirmed patient is taking all newly prescribed medications as instructed (any discrepancies are noted in review section)   Patient / Caregiver is aware of any changes to and / or  any dosage adjustments to medication  regimen. Patient/ Caregiver denies questions at this time and reports no barriers to medication adherence.   Medication updates  Pneumonia vaccine  was requested and provided   linaclotide 290 MCG Caps capsule Commonly known as: LINZESS Take 1 capsule (290 mcg total) by mouth daily before breakfast. Start taking on: June 19, 2023  apixaban 2.5 MG Tabs tablet Commonly known as: ELIQUIS Take 1 tablet (2.5 mg total) by mouth 2 (two) times daily. Start taking on: June 27, 2023 What changed: These instructions start on June 27, 2023. If you are unsure what to do until then, ask your doctor or other care provider.   Home Care and Equipment/Supplies: Were Home Health Services Ordered?: No Any new equipment or medical supplies ordered?: No  Functional Questionnaire: Do you need assistance with bathing/showering or dressing?: No Do you need assistance with meal preparation?: Yes Do you need assistance with eating?: No Do you have difficulty maintaining continence: No Do you need assistance with getting out of bed/getting out of a chair/moving?: No (Occ uses a walker) Do you have difficulty managing or taking your medications?: Yes (She has forgotten medication)  Follow up appointments reviewed: PCP Follow-up appointment confirmed?: No MD Provider Line Number:[phone] Given: No (Private PCP She will call and schedule) Specialist Hospital Follow-up appointment confirmed?: No Reason Specialist Follow-Up Not Confirmed: Patient has Specialist Provider Number and will Call for Appointment (She was told to schedule in 5 months , Her Niece will schedue as she provides transportation) Do you need transportation to your follow-up appointment?: No Do you understand care options if your condition(s) worsen?: Yes-patient verbalized understanding  SDOH Interventions Today    Flowsheet Row Most Recent Value  SDOH Interventions   Food Insecurity Interventions Intervention Not Indicated   Housing Interventions Intervention Not Indicated  Transportation Interventions Intervention Not Indicated, Patient Resources (Friends/Family), Payor Benefit  Utilities Interventions Intervention Not Indicated       Goals Addressed             This Visit's Progress    TOC Care Plan       Current Barriers:  Medication management Medications on hold  Provider appointments with PCP, Pulmonology and GI  Knowledge Deficits related to plan of care for management of COPD   RNCM Clinical Goal(s):  Patient will verbalize understanding of plan for management of COPD as evidenced by taking medications as directed and less frequent flare-ups  take all medications exactly as prescribed and will call provider for medication related questions as evidenced by no missed medications  attend all scheduled medical appointments: as evidenced by  no missed follow-up visits through collaboration with RN Care manager, provider, and care team.   Interventions: Evaluation of current treatment plan related to  self management and patient's adherence to plan as established by provider  Transitions of Care:  New goal. Sick Day Rules Reviewed Doctor Visits  - discussed the importance of doctor visits Post discharge activity limitations prescribed by provider reviewed Reviewed Signs and symptoms of infection  COPD Interventions:  (Status:  New goal.) Short Term Goal Provided instruction about proper use of medications used for management of COPD including inhalers Provided education about and advised patient to utilize infection prevention strategies to reduce risk of respiratory infection Discussed the importance  of adequate rest and management of fatigue with COPD Screening for signs and symptoms of depression related to chronic disease state  Assessed social determinant of health barriers  Patient Goals/Self-Care Activities: Participate in Transition of Care Program/Attend TOC scheduled calls Take all  medications as prescribed Attend all scheduled provider appointments Call pharmacy for medication refills 3-7 days in advance of running out of medications Perform all self care activities independently  Perform IADL's (shopping, preparing meals, housekeeping, managing finances) independently Call provider office for new concerns or questions  limit outdoor activity during cold weather listen for public air quality announcements every day do breathing exercises every day follow rescue plan if symptoms flare-up keep follow-up appointments: with PCP and Specialists as directed  use an extra pillow to sleep don't eat or exercise right before bedtime eat healthy/prescribed diet: Heart Healthy  use devices that will help like a cane, sock-puller or reacher do breathing exercises every day do exercises in a comfortable position that makes breathing as easy as possible  Follow Up Plan:   3 /25/25/@ 11:00am         Patient is at high risk for readmission and/or has history of  high utilization  Discussed VBCI  TOC program and weekly calls to patient to assess condition/status, medication management  and provide support/education as indicated . Patient/ Caregiver voiced understanding and is  agreeable to 30 day program    Routine follow-up and on-going assessment evaluation and education of disease processes, recommended interventions for both chronic and acute medical conditions , will occur during each weekly visit along with ongoing review of symptoms ,medication reviews and reconciliation. Any updates , inconsistencies, discrepancies or acute care concerns will be addressed and routed to the correct Practitioner if indicated   Based on current information and Insurance plan -Reviewed benefits available to patient, including details about eligibility options for care if any area of needs were identified.  Reviewed patients ability to access and / or navigating the benefits system..Amb Referral  made if indicted , refer to orders section of note for details   Please refer to Care Plan for goals and interventions -Effectiveness of interventions, symptom management and outcomes will be evaluated  weekly during [facility] 30-day Program Outreach calls  . Any necessary  changes and updates to Care Plan will be completed episodically    Reviewed goals for care Patient verbalizes understanding of instructions and care plan provided. Patient was encouraged to make informed decisions about their care, actively participate in managing their health condition, and implement lifestyle changes as needed to promote independence and self-management of health care   Patient was encouraged to Contact PCP with any changes in baseline or  medication regimen,  changes in health status  /  well-being, safety concerns, including falls any questions or concerns regarding ongoing medical care, any difficulty obtaining or picking up prescriptions, any changes or worsening in condition- including  symptoms not relieved  with interventions    The patient has been provided with contact information for the care management team and has been advised to call with any health-related questions or concerns. Follow up call  with Care Team  as scheduled,or sooner should any new problems arise.   Susa Loffler , BSN, RN [facility]   VBCI-Population Health RN Care Manager Direct Dial [phone]  Fax: [phone] Website: [email or web address]

## 2023-06-21 LAB — SURGICAL PATHOLOGY

## 2023-06-27 ENCOUNTER — Ambulatory Visit: Payer: 59 | Admitting: Pulmonary Disease

## 2023-06-27 ENCOUNTER — Encounter: Payer: Self-pay | Admitting: Pulmonary Disease

## 2023-06-27 VITALS — BP 164/77 | HR 68 | Ht 67.0 in | Wt 136.6 lb

## 2023-06-27 DIAGNOSIS — Z01811 Encounter for preprocedural respiratory examination: Secondary | ICD-10-CM | POA: Diagnosis not present

## 2023-06-27 DIAGNOSIS — I4891 Unspecified atrial fibrillation: Secondary | ICD-10-CM

## 2023-06-27 DIAGNOSIS — D62 Acute posthemorrhagic anemia: Secondary | ICD-10-CM

## 2023-06-27 DIAGNOSIS — J449 Chronic obstructive pulmonary disease, unspecified: Secondary | ICD-10-CM

## 2023-06-27 NOTE — Patient Instructions (Signed)
 VISIT SUMMARY:  Patricia Jackson, an 83 year old female with COPD, visited for reestablishment of care and preoperative evaluation for eye surgery. She has a history of COPD, atrial fibrillation, and recent anemia due to blood loss. 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  YOUR PLAN:  -COPD: Chronic Obstructive Pulmonary Disease (COPD) is a chronic inflammatory lung disease that causes obstructed airflow from the lungs. Your breathing is well-managed with Trelegy, and there have been no recent hospitalizations for exacerbations. Continue using Trelegy as prescribed.  -ANEMIA DUE TO BLOOD LOSS: Anemia is a condition where you lack enough healthy red blood cells to carry adequate oxygen to your body's tissues. Your recent anemia was due to blood loss, and it was treated with a blood transfusion. We will monitor your hemoglobin levels and ensure follow-up with your primary care doctor for ongoing management.  -ATRIAL FIBRILLATION: Atrial fibrillation is an irregular and often rapid heart rate that can increase your risk of strokes, heart failure, and other heart-related complications. You have experienced episodes of palpitations but no recent hospitalizations. Follow up with your cardiologist to manage this condition.  -RECURRENT RETINAL DETACHMENT: Recurrent retinal detachment is a condition where the retina detaches from the back of the eye, which can lead to vision loss if not treated. You are scheduled for surgery to address this issue. We have provided surgical clearance for general anesthesia and will communicate with Dr. Doristine Church office regarding this.  INSTRUCTIONS:  Please schedule a follow-up appointment with pulmonology as needed. Ensure you follow up with your primary care doctor for anemia management and with your cardiologist for atrial fibrillation management. We will coordinate your care with your primary care and specialists for comprehensive management.

## 2023-06-27 NOTE — Progress Notes (Signed)
 Patricia Jackson    [id number]    [birth year]  Primary Care Physician:Jackson, Patricia Hazel, MD  Referring Physician: Benita Stabile, MD [address] Patricia Jackson,  [place] [address]  Chief complaint:   Follow-up for  COPD GOLD GOLD D Pre op clearance for eye surgery  HPI: [name] is a 83 y.o.  with past medical history of atrial fibrillation, COPD GOLD (CAT score 24, Multiple exacerbations).   She was hospitalized in August of 2017 with atypical chest pain, COPD exacerbation. She was treated with prednisone taper. She was also noted to be in atrial flutter. She was initially treated with a Cardizem drip and then transitioned to amiodarone. The plan is to use this for short-term of about 8 weeks. She is followed by cardiology.  Had another hospitalization in December 2017 with NSTEMI status post catheterization and stent placement. Also noted to have low hemoglobin of 6.5. Transfused for an deficiency anemia.  Admitted in June 2018 to [facility] for acute dyspnea. She had an evaluation including chest x-ray which showed COPD. VQ scan shows low probability for pulmonary embolism and lower extremity ultrasound was negative for DVT. She improved with the treatment and was discharged. She did not require antibiotics or prednisone. Her inhalers have been changed from Symbicort, Spiriva to breo and incruse.   Interim History: Discussed the use of AI scribe software for clinical note transcription with the patient, who gave verbal consent to proceed.  Patricia Jackson is an 83 year old female with COPD who presents for reestablishment of care and preoperative evaluation for eye surgery. She is accompanied by her niece.  She has a history of COPD and has been on Trelegy since 2019. She quit smoking in 2017 after a significant hospitalization. Her breathing is sometimes stable, but she occasionally experiences difficulty, impacting her ability to exercise. She is not on oxygen  therapy.  She has a history of atrial fibrillation and has experienced palpitations. She has been in and out of the hospital for heart issues since her last visit in 2019.  Recently, she had a hospitalization in March 2025 to low blood counts, with a hemoglobin level of 4.8. 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 A colonoscopy showed diverticulosis and polyps.  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 She previously had surgery last year for the same issue, but the retina detached again. She recently experienced an episode where she 'woke up and couldn't see,' and her eye was 'full of blood.'  Follows with Patricia Jackson at [facility]   Outpatient Encounter Medications as of 06/27/2023  Medication Sig   acetaminophen (TYLENOL) 500 MG tablet Take 1,000 mg by mouth every 8 (eight) hours as needed for mild pain or moderate pain.   amiodarone (PACERONE) 200 MG tablet Take 1 Tablet ( 200 mg ) Daily and none on Sunday   apixaban (ELIQUIS) 2.5 MG TABS tablet Take 1 tablet (2.5 mg total) by mouth 2 (two) times daily.   buPROPion (WELLBUTRIN XL) 150 MG 24 hr tablet Take 150 mg by mouth daily.   Cholecalciferol (VITAMIN D3) 50 MCG (2000 UT) TABS Take 2,000 Units by mouth daily.   levothyroxine (SYNTHROID) 50 MCG tablet Take 50 mcg by mouth daily before breakfast.    linaclotide (LINZESS) 290 MCG CAPS capsule Take 1 capsule (290 mcg total) by mouth daily before breakfast.   pantoprazole (PROTONIX) 40 MG  tablet Take 1 tablet (40 mg total) by mouth daily.   sertraline (ZOLOFT) 25 MG tablet Take 25 mg by mouth daily.   TRELEGY ELLIPTA 200-62.5-25 MCG/ACT AEPB Inhale 1 puff into the lungs daily.   nitroGLYCERIN (NITROSTAT) 0.4 MG SL tablet Place 0.4 mg under the tongue every 5 (five) minutes as needed for chest pain. (Patient not taking: Reported on 06/27/2023)   rosuvastatin (CRESTOR) 20 MG tablet Take 20 mg by mouth daily.  (Patient not taking: Reported on  06/27/2023)   No facility-administered encounter medications on file as of 06/27/2023.   Physical Exam: Blood pressure (!) 164/77, pulse 68, height 5\' 7"  (1.702 m), weight 136 lb 9.6 oz (62 kg), SpO2 97%. Gen:      No acute distress HEENT:  EOMI, sclera anicteric Neck:     No masses; no thyromegaly Lungs:    Clear to auscultation bilaterally; normal respiratory effort CV:         Regular rate and rhythm; no murmurs Abd:      + bowel sounds; soft, non-tender; no palpable masses, no distension Ext:    No edema; adequate peripheral perfusion Skin:      Warm and dry; no rash Neuro: alert and oriented x 3 Psych: normal mood and affect   Data Reviewed: Imaging  CT chest 11/26/13. Diffuse emphysematous changes Screening CT of chest 06/23/16-. Emphysematous changes, three-vessel coronary artery disease Chest x-ray 09/08/16-chronic hyperinflation, emphysematous changes VQ scan 09/08/16-low probability High-resolution CT 09/24/2016- No interstitial lung disease, moderate emphysema three-vessel coronary artery disease CT chest 02/09/2021-centrilobular and paraseptal emphysema, scattered pulmonary nodules CTA 09/12/2022-no pulm embolism, small airway inflammation, cardiomegaly with interstitial pulmonary edema, emphysema I had reviewed the images personally.  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 Breathing is well-managed with Trelegy. No recent hospitalizations for exacerbations. No supplemental oxygen use. Variable exercise tolerance with occasional exertional dyspnea. - Continue Trelegy for COPD management  Anemia due to blood loss Recent hospitalization in March for anemia secondary to blood loss. 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 - Monitor hemoglobin levels - Ensure follow-up with primary care for anemia management  Atrial fibrillation Atrial fibrillation with episodes of palpitations. No recent hospitalizations for complications. 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 Previous surgery last year, but retina detached again. Requires clearance for general anesthesia for upcoming surgery.  COPD is under good control She is at low risk for perioperative pulmonary complications given her age and COPD No pulmonary contraindications for surgery Recommend early mobilization, use of incentive spirometer post surgery.  - Communicate with Dr. Doristine Church office regarding surgical clearance  Plan/Recommendations: - Continue trelegy inhaler. - Cleared for surgery.  Chilton Greathouse MD [facility] 06/27/2023, 9:44 AM  CC: Patricia Stabile,  MD

## 2023-06-28 ENCOUNTER — Other Ambulatory Visit: Payer: Self-pay

## 2023-06-28 NOTE — Transitions of Care (Post Inpatient/ED Visit) (Signed)
   06/28/2023  Name: Patricia Jackson MRN: [id number] DOB: [birth year]  Today's TOC FU Call Status:    Attempted to reach the patient regarding the most recent Inpatient/ED visit. Patient was called in an Outreach attempt to offer VBCI  30-day TOC program. Pt is eligible for program due to potential risk for readmission and/or high utilization. Unfortunately, I was not able to speak with the patient in regards to recent hospital discharge    Patient's voicemail has  a generic greeting. To maintain HIPAA compliance, left message with VBCI CM contact information only  and a request for a call back .   Follow Up Plan: Additional outreach attempts will be made to reach the patient to complete the Transitions of Care (Post Inpatient/ED visit) call.   Susa Loffler , BSN, RN [facility]   VBCI-Population Health RN Care Manager Direct Dial [phone]  Fax: [phone] Website: [email or web address]

## 2023-06-30 ENCOUNTER — Telehealth: Payer: Self-pay

## 2023-06-30 NOTE — Patient Outreach (Signed)
 Care Management  Transitions of Care Program Transitions of Care Post-discharge week 2  06/30/2023 Name: Patricia Jackson MRN: [id number] DOB: [birth year]  Subjective: Patricia Jackson is a 83 y.o. year old female who is a primary care patient of Benita Stabile, MD. The Care Management team was unable to reach the patient by phone to assess and address transitions of care needs.   Patient  Outreach attempt is in the course of  VBCI  30-day TOC program. Pt previously agreed and is enrolled in the  program due to potential risk for readmission and/or high utilization. Unfortunately, I was not able to speak with the patient in regards to recent hospital discharge 1st attempt 06/28/23 2nd attempt 06/30/23   Patient's voicemail has  a generic greeting. To maintain HIPAA compliance, left message including only VBCI CM contact information and a request for a call back .    Plan: Additional outreach attempts will be made to reach the patient enrolled in the [facility] Program (Post Inpatient/ED Visit).  Susa Loffler , BSN, RN [facility]   VBCI-Population Health RN Care Manager Direct Dial [phone]  Fax: [phone] Website: [email or web address]

## 2023-07-01 ENCOUNTER — Telehealth: Payer: Self-pay

## 2023-07-01 NOTE — Patient Outreach (Signed)
 Care Management  Transitions of Care Program Transitions of Care Post-discharge week 2  07/01/2023 Name: Patricia Jackson MRN: [id number] DOB: [birth year]  Subjective: Patricia Jackson is a 83 y.o. year old female who is a primary care patient of Benita Stabile, MD. The Care Management team was unable to reach the patient by phone to assess and address transitions of care needs.    The patient has previously been  provided with contact information for the [facility] care management team and has been advised to call with any health related questions or concerns.   Patient was encouraged to Contact PCP with any changes in baseline or  medication regimen,  changes in health status  /  well-being, safety concerns, including falls any questions or concerns regarding ongoing medical care, any difficulty obtaining or picking up prescriptions, any changes or worsening in condition- including  symptoms not relieved  with interventions      Three attempts have been made to reach the patient regarding the most recent Inpatient/ED visit. Attempt 1   3/25   /2025 Attempt 2  3/27  /2025 Attempt 3  3/28 /2025  Based on the VBCI program guidelines, if  we are unable to reach the patient  after 3 attempts, no additional outreach attempts will be made and the TOC follow-up will be closed Unfortunately, we have been unable to make contact with the patient for follow up.   The [facility] Case Management Team is available to follow up with the patient after provider conversation with the patient regarding recommendation for care management engagement and subsequent re-referral to the case management team.The VBCI CM team can be reached by calling [phone]. Marland Kitchen   Plan: No further outreach attempts will be made at this time.  We have been unable to reach the patient.  Susa Loffler , BSN, RN [facility]   VBCI-Population Health RN Care Manager Direct Dial [phone]  Fax: [phone] Website:  [email or web address]

## 2023-08-04 DIAGNOSIS — J449 Chronic obstructive pulmonary disease, unspecified: Secondary | ICD-10-CM | POA: Diagnosis not present

## 2023-08-04 DIAGNOSIS — D649 Anemia, unspecified: Secondary | ICD-10-CM | POA: Diagnosis not present

## 2023-08-04 DIAGNOSIS — N184 Chronic kidney disease, stage 4 (severe): Secondary | ICD-10-CM | POA: Diagnosis not present

## 2023-08-04 DIAGNOSIS — E782 Mixed hyperlipidemia: Secondary | ICD-10-CM | POA: Diagnosis not present

## 2023-08-11 DIAGNOSIS — I4891 Unspecified atrial fibrillation: Secondary | ICD-10-CM | POA: Diagnosis not present

## 2023-08-11 DIAGNOSIS — E782 Mixed hyperlipidemia: Secondary | ICD-10-CM | POA: Diagnosis not present

## 2023-08-11 DIAGNOSIS — N184 Chronic kidney disease, stage 4 (severe): Secondary | ICD-10-CM | POA: Diagnosis not present

## 2023-08-11 DIAGNOSIS — D649 Anemia, unspecified: Secondary | ICD-10-CM | POA: Diagnosis not present

## 2023-08-11 DIAGNOSIS — I10 Essential (primary) hypertension: Secondary | ICD-10-CM | POA: Diagnosis not present

## 2023-08-11 DIAGNOSIS — E039 Hypothyroidism, unspecified: Secondary | ICD-10-CM | POA: Diagnosis not present

## 2023-08-11 DIAGNOSIS — F5101 Primary insomnia: Secondary | ICD-10-CM | POA: Diagnosis not present

## 2023-08-11 DIAGNOSIS — J449 Chronic obstructive pulmonary disease, unspecified: Secondary | ICD-10-CM | POA: Diagnosis not present

## 2023-08-11 DIAGNOSIS — E559 Vitamin D deficiency, unspecified: Secondary | ICD-10-CM | POA: Diagnosis not present

## 2023-08-11 DIAGNOSIS — K219 Gastro-esophageal reflux disease without esophagitis: Secondary | ICD-10-CM | POA: Diagnosis not present

## 2023-08-15 DIAGNOSIS — R809 Proteinuria, unspecified: Secondary | ICD-10-CM | POA: Diagnosis not present

## 2023-08-15 DIAGNOSIS — N1832 Chronic kidney disease, stage 3b: Secondary | ICD-10-CM | POA: Diagnosis not present

## 2023-08-15 DIAGNOSIS — D631 Anemia in chronic kidney disease: Secondary | ICD-10-CM | POA: Diagnosis not present

## 2023-08-15 DIAGNOSIS — N2581 Secondary hyperparathyroidism of renal origin: Secondary | ICD-10-CM | POA: Diagnosis not present

## 2023-08-15 DIAGNOSIS — I129 Hypertensive chronic kidney disease with stage 1 through stage 4 chronic kidney disease, or unspecified chronic kidney disease: Secondary | ICD-10-CM | POA: Diagnosis not present

## 2023-08-15 DIAGNOSIS — N189 Chronic kidney disease, unspecified: Secondary | ICD-10-CM | POA: Diagnosis not present

## 2023-08-17 ENCOUNTER — Encounter: Payer: Self-pay | Admitting: Internal Medicine

## 2023-08-17 ENCOUNTER — Encounter (HOSPITAL_COMMUNITY): Payer: Self-pay | Admitting: Hematology

## 2023-08-17 DIAGNOSIS — N1832 Chronic kidney disease, stage 3b: Secondary | ICD-10-CM | POA: Diagnosis not present

## 2023-08-19 DIAGNOSIS — F5101 Primary insomnia: Secondary | ICD-10-CM | POA: Diagnosis not present

## 2023-08-19 DIAGNOSIS — D509 Iron deficiency anemia, unspecified: Secondary | ICD-10-CM | POA: Diagnosis not present

## 2023-08-19 DIAGNOSIS — I10 Essential (primary) hypertension: Secondary | ICD-10-CM | POA: Diagnosis not present

## 2023-08-22 ENCOUNTER — Other Ambulatory Visit: Payer: Self-pay | Admitting: Nephrology

## 2023-08-22 ENCOUNTER — Telehealth: Payer: Self-pay

## 2023-08-22 DIAGNOSIS — D631 Anemia in chronic kidney disease: Secondary | ICD-10-CM | POA: Insufficient documentation

## 2023-08-22 NOTE — Telephone Encounter (Signed)
 Auth Submission: NO AUTH NEEDED Site of care: Site of care: [facility] Payer: [facility] Medication & CPT/J Code(s) submitted: Venofer (Iron Sucrose) J1756 Route of submission (phone, fax, portal): portal Phone # Fax # Auth type: Buy/Bill PB Units/visits requested: 200mg , 5 doses Reference number: [id number] Approval from: 08/22/23 to 02/22/24

## 2023-08-24 ENCOUNTER — Encounter: Payer: Self-pay | Admitting: Internal Medicine

## 2023-08-24 ENCOUNTER — Encounter: Payer: Self-pay | Admitting: Nephrology

## 2023-08-24 ENCOUNTER — Encounter (HOSPITAL_COMMUNITY): Payer: Self-pay | Admitting: Hematology

## 2023-08-30 ENCOUNTER — Other Ambulatory Visit: Payer: Self-pay | Admitting: Emergency Medicine

## 2023-08-30 DIAGNOSIS — F5101 Primary insomnia: Secondary | ICD-10-CM | POA: Diagnosis not present

## 2023-08-30 DIAGNOSIS — R29898 Other symptoms and signs involving the musculoskeletal system: Secondary | ICD-10-CM | POA: Diagnosis not present

## 2023-08-30 DIAGNOSIS — I10 Essential (primary) hypertension: Secondary | ICD-10-CM | POA: Diagnosis not present

## 2023-08-31 ENCOUNTER — Encounter: Attending: Internal Medicine | Admitting: *Deleted

## 2023-08-31 VITALS — BP 131/58 | HR 71 | Temp 97.6°F | Resp 18

## 2023-08-31 DIAGNOSIS — D500 Iron deficiency anemia secondary to blood loss (chronic): Secondary | ICD-10-CM | POA: Diagnosis not present

## 2023-08-31 DIAGNOSIS — K922 Gastrointestinal hemorrhage, unspecified: Secondary | ICD-10-CM | POA: Diagnosis not present

## 2023-08-31 DIAGNOSIS — D508 Other iron deficiency anemias: Secondary | ICD-10-CM | POA: Insufficient documentation

## 2023-08-31 DIAGNOSIS — N189 Chronic kidney disease, unspecified: Secondary | ICD-10-CM | POA: Insufficient documentation

## 2023-08-31 DIAGNOSIS — D631 Anemia in chronic kidney disease: Secondary | ICD-10-CM | POA: Insufficient documentation

## 2023-08-31 MED ORDER — IRON SUCROSE 20 MG/ML IV SOLN
200.0000 mg | Freq: Once | INTRAVENOUS | Status: AC
  Administered 2023-08-31: 200 mg via INTRAVENOUS

## 2023-08-31 MED ORDER — DIPHENHYDRAMINE HCL 25 MG PO CAPS
25.0000 mg | ORAL_CAPSULE | Freq: Once | ORAL | Status: AC
  Administered 2023-08-31: 25 mg via ORAL

## 2023-08-31 MED ORDER — ACETAMINOPHEN 325 MG PO TABS
650.0000 mg | ORAL_TABLET | Freq: Once | ORAL | Status: AC
  Administered 2023-08-31: 650 mg via ORAL

## 2023-08-31 NOTE — Progress Notes (Addendum)
 Diagnosis: Anemia in Chronic Kidney Disease  Provider:  Cristi Donalds, MD  Procedure: IV Push  IV Type: Peripheral, IV Location: R Hand  Venofer (Iron Sucrose), Dose: 200 mg  Post Infusion IV Care: Observation period completed  Discharge: Condition: Good, Destination: Home . AVS Provided  Performed by:  Devonne Folk, RN

## 2023-09-02 ENCOUNTER — Encounter (INDEPENDENT_AMBULATORY_CARE_PROVIDER_SITE_OTHER): Admitting: Emergency Medicine

## 2023-09-02 VITALS — BP 127/56 | HR 70 | Temp 98.0°F | Resp 16

## 2023-09-02 DIAGNOSIS — D631 Anemia in chronic kidney disease: Secondary | ICD-10-CM

## 2023-09-02 DIAGNOSIS — K922 Gastrointestinal hemorrhage, unspecified: Secondary | ICD-10-CM | POA: Diagnosis not present

## 2023-09-02 DIAGNOSIS — D500 Iron deficiency anemia secondary to blood loss (chronic): Secondary | ICD-10-CM

## 2023-09-02 DIAGNOSIS — D508 Other iron deficiency anemias: Secondary | ICD-10-CM

## 2023-09-02 MED ORDER — IRON SUCROSE 20 MG/ML IV SOLN
200.0000 mg | Freq: Once | INTRAVENOUS | Status: AC
  Administered 2023-09-02: 200 mg via INTRAVENOUS

## 2023-09-02 MED ORDER — ACETAMINOPHEN 325 MG PO TABS
650.0000 mg | ORAL_TABLET | Freq: Once | ORAL | Status: AC
  Administered 2023-09-02: 650 mg via ORAL

## 2023-09-02 MED ORDER — SODIUM CHLORIDE 0.9 % IV SOLN: INTRAVENOUS | Status: AC

## 2023-09-02 MED ORDER — DIPHENHYDRAMINE HCL 25 MG PO CAPS
25.0000 mg | ORAL_CAPSULE | Freq: Once | ORAL | Status: AC
  Administered 2023-09-02: 25 mg via ORAL

## 2023-09-02 NOTE — Progress Notes (Deleted)
 Office Visit Note  Patient: Patricia Jackson             Date of Birth: [birth year]           MRN: [id number]             PCP: Omie Bickers, MD Referring: Omie Bickers, MD Visit Date: 09/16/2023 Occupation: @GUAROCC @  Subjective:  No chief complaint on file.   History of Present Illness: Patricia Jackson is a 83 y.o. female with history of positive ANA, osteoarthritis, and osteoporosis.     Activities of Daily Living:  Patient reports morning stiffness for *** {minute/hour:19697}.   Patient {ACTIONS;DENIES/REPORTS:21021675::"Denies"} nocturnal pain.  Difficulty dressing/grooming: {ACTIONS;DENIES/REPORTS:21021675::"Denies"} Difficulty climbing stairs: {ACTIONS;DENIES/REPORTS:21021675::"Denies"} Difficulty getting out of chair: {ACTIONS;DENIES/REPORTS:21021675::"Denies"} Difficulty using hands for taps, buttons, cutlery, and/or writing: {ACTIONS;DENIES/REPORTS:21021675::"Denies"}  No Rheumatology ROS completed.   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   Severe anemia 06/16/2023   Acute blood loss anemia 06/15/2023   Elevated brain natriuretic peptide (BNP) level 02/03/2023   Paroxysmal atrial fibrillation with RVR (HCC) 02/02/2023   Malnutrition of moderate degree 01/24/2023   Atrial fibrillation with RVR (HCC) 01/20/2023   On anticoagulant therapy 11/21/2022   ABLA (acute blood loss anemia) 11/21/2022   Raynaud's syndrome 11/20/2022   Osteoarthritis 11/20/2022   Generalized anxiety disorder 11/18/2022   Positive antinuclear antibody 11/02/2022   Insomnia 11/02/2022   Gastroesophageal reflux disease without esophagitis 11/01/2022   Abrasion of lower back 10/11/2022   Atrial fibrillation (HCC) 09/23/2022   Vitamin D  deficiency 09/23/2022   Syncope 09/13/2022   CAP (community acquired pneumonia) 09/12/2022   sepsis from pneumonia 07/26/2022    intermittent confusion 07/26/2022   Hyponatremia 07/26/2022   Preoperative evaluation of a medical condition to rule out surgical contraindications (TAR required) 05/14/2022   Fracture of distal end of radius 05/02/2022   Left wrist pain 04/28/2022   Osteoporosis 01/19/2022   DOE (dyspnea on exertion)    Acidosis, unspecified 06/20/2021   Elevated troponin 06/19/2021   Iron  deficiency anemia 06/12/2021   Acquired hypothyroidism 05/15/2021   Chronic kidney disease, 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   Essential hypertension 12/10/2020   Retinal hemorrhage, right eye 11/04/2020   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   Traction detachment of left retina 08/03/2019   Left retinal detachment 08/01/2019   Age-related macular degeneration 08/01/2019   Retinal hemorrhage of left eye 08/01/2019   Advanced nonexudative age-related macular degeneration of left eye with subfoveal involvement 08/01/2019   Retinal detachment 07/26/2019   Choroidal detachment of right eye 07/26/2019   Exudative retinopathy of left eye 07/26/2019   Thrombocytopenic disorder (HCC) 07/04/2019   Orthostatic syncope 08/27/2017   Atypical chest pain 08/27/2017   Atrial flutter (HCC) 10/28/2016   Chronic kidney disease, stage 4 (severe) (HCC) 10/28/2016   Chronic obstructive pulmonary disease (HCC) 10/28/2016   Depression 10/28/2016   Acute kidney injury superimposed on stage 4 chronic kidney disease (HCC) 09/08/2016   Hyperglycemia 09/08/2016   Esophageal dysphagia    Anemia 04/07/2016   CAD in native artery 03/16/2016   S/P angioplasty with stent 03/15/16 DES Resolute, pLCX 03/16/2016   NSTEMI (non-ST elevated myocardial infarction) (HCC) 03/12/2016   Dyspnea on exertion    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HCC) 10/31/2015   Hyperlipidemia 10/31/2015   Atrial flutter with rapid ventricular response (HCC) 10/30/2015   Back pain  11/26/2013   Arm pain 11/26/2013   Chest pain 11/26/2013     Past Medical History:  Diagnosis Date   A-fib [facility])    Atrial flutter (HCC)    On Eliquis  in 8/17 but discontinued after stent placement   Breast cancer (HCC)    remote   CAD in native artery 03/16/2016   COPD (chronic obstructive pulmonary disease) (HCC)    Dysrhythmia    Hypertension    Iron  deficiency anemia 06/12/2021   NSTEMI (non-ST elevated myocardial infarction) (HCC)    2017   S/P angioplasty with stent 03/15/16 DES Resolute, pLCX 03/16/2016   Vitreous hemorrhage of left eye (HCC) 08/01/2019    Family History  Problem Relation Age of Onset   Heart disease Mother    COPD Father    Cancer Sister        unknown primary   Cancer Brother        unknown primary   Cancer Brother    Hypertension Son    Colon cancer Neg Hx    Past Surgical History:  Procedure Laterality Date   APPENDECTOMY     BREAST SURGERY Left    CARDIAC CATHETERIZATION N/A 03/15/2016   Procedure: Left Heart Cath and Coronary Angiography;  Surgeon: Arty Binning, MD;  Location: [facility] INVASIVE CV LAB;  Service: Cardiovascular;  Laterality: N/A;   CARDIAC CATHETERIZATION N/A 03/15/2016   Procedure: Coronary Stent Intervention;  Surgeon: Arty Binning, MD;  Location: [facility] INVASIVE CV LAB;  Service: Cardiovascular;  Laterality: N/A;   CARDIOVERSION N/A 02/04/2023   Procedure: CARDIOVERSION;  Surgeon: Lasalle Pointer, MD;  Location: [facility];  Service: Cardiovascular;  Laterality: N/A;   COLONOSCOPY     remote   COLONOSCOPY N/A 05/26/2016   Procedure: COLONOSCOPY;  Surgeon: Suzette Espy, MD;  Location: [facility];  Service: Endoscopy;  Laterality: N/A;  845   COLONOSCOPY N/A 06/17/2023   Procedure: COLONOSCOPY;  Surgeon: Suzette Espy, MD;  Location: [facility];  Service: Endoscopy;  Laterality: N/A;   ESOPHAGOGASTRODUODENOSCOPY N/A 05/26/2016   Procedure: ESOPHAGOGASTRODUODENOSCOPY (EGD);  Surgeon: Suzette Espy, MD;  Location: [facility];  Service: Endoscopy;  Laterality: N/A;    ESOPHAGOGASTRODUODENOSCOPY N/A 06/16/2023   Procedure: EGD (ESOPHAGOGASTRODUODENOSCOPY);  Surgeon: Hargis Lias, MD;  Location: [facility];  Service: Endoscopy;  Laterality: N/A;   ESOPHAGOGASTRODUODENOSCOPY (EGD) WITH PROPOFOL  N/A 11/22/2022   Procedure: ESOPHAGOGASTRODUODENOSCOPY (EGD) WITH PROPOFOL ;  Surgeon: Vinetta Greening, DO;  Location: [facility];  Service: Endoscopy;  Laterality: N/A;   EYE SURGERY     EYE SURGERY Left    Dr. Katheleen Palmer   HEMOSTASIS CLIP PLACEMENT  06/17/2023   Procedure: CONTROL OF HEMORRHAGE, GI TRACT, ENDOSCOPIC, BY CLIPPING OR OVERSEWING;  Surgeon: Suzette Espy, MD;  Location: [facility];  Service: Endoscopy;;   HOT HEMOSTASIS  11/22/2022   Procedure: HOT HEMOSTASIS (ARGON PLASMA COAGULATION/BICAP);  Surgeon: Vinetta Greening, DO;  Location: [facility];  Service: Endoscopy;;   HOT HEMOSTASIS  06/16/2023   Procedure: EGD, WITH ARGON PLASMA COAGULATION;  Surgeon: Hargis Lias, MD;  Location: [facility];  Service: Endoscopy;;   MALONEY DILATION N/A 05/26/2016   Procedure: Londa Rival DILATION;  Surgeon: Suzette Espy, MD;  Location: [facility];  Service: Endoscopy;  Laterality: N/A;   POLYPECTOMY  06/17/2023   Procedure: POLYPECTOMY;  Surgeon: Suzette Espy, MD;  Location: [facility];  Service: Endoscopy;;   SCLEROTHERAPY  06/17/2023  Procedure: SCLEROTHERAPY;  Surgeon: Suzette Espy, MD;  [facility]: [facility];  Service: Endoscopy;;   TEE WITHOUT CARDIOVERSION N/A 02/04/2023   Procedure: TRANSESOPHAGEAL ECHOCARDIOGRAM (TEE);  Surgeon: Mallipeddi, Vishnu P, MD;  Location: [facility];  Service: Cardiovascular;  Laterality: N/A;   TOOTH EXTRACTION N/A 01/15/2022   Procedure: DENTAL RESTORATION/EXTRACTIONS;  Surgeon: Ascencion Lava, DMD;  Location: [facility];  Service: Oral Surgery;  Laterality: N/A;   VITRECTOMY Left 10/03/2019   Dr. Seward Dao, Vitrectomy, Focal Laser, Removal of Silicone Oil   Social History   Social History Narrative   **  Merged History Encounter **       Immunization History  Administered Date(s) Administered   Fluad Trivalent(High Dose 65+) 01/23/2023   Influenza,inj,Quad PF,6+ Mos 01/27/2016   Moderna Sars-Covid-2 Vaccination 06/15/2019, 07/16/2019   PNEUMOCOCCAL CONJUGATE-20 08/13/2022, 06/18/2023   Tdap 09/04/2019     Objective: Vital Signs: There were no vitals taken for this visit.   Physical Exam Vitals and nursing note reviewed.  Constitutional:      Appearance: She is well-developed.  HENT:     Head: Normocephalic and atraumatic.  Eyes:     Conjunctiva/sclera: Conjunctivae normal.  Cardiovascular:     Rate and Rhythm: Normal rate and regular rhythm.     Heart sounds: Normal heart sounds.  Pulmonary:     Effort: Pulmonary effort is normal.     Breath sounds: Normal breath sounds.  Abdominal:     General: Bowel sounds are normal.     Palpations: Abdomen is soft.  Musculoskeletal:     Cervical back: Normal range of motion.  Lymphadenopathy:     Cervical: No cervical adenopathy.  Skin:    General: Skin is warm and dry.     Capillary Refill: Capillary refill takes less than 2 seconds.  Neurological:     Mental Status: She is alert and oriented to person, place, and time.  Psychiatric:        Behavior: Behavior normal.      Musculoskeletal Exam: ***  CDAI Exam: CDAI Score: -- Patient Global: --; Provider Global: -- Swollen: --; Tender: -- Joint Exam 09/16/2023   No joint exam has been documented for this visit   There is currently no information documented on the homunculus. Go to the Rheumatology activity and complete the homunculus joint exam.  Investigation: No additional findings.  Imaging: No results found.  Recent Labs: Lab Results  Component Value Date   WBC 13.3 (H) 06/18/2023   HGB 8.8 (L) 06/18/2023   PLT  06/18/2023    PLATELET CLUMPS NOTED ON SMEAR, COUNT APPEARS DECREASED   NA 137 06/18/2023   K 4.0 06/18/2023   CL 114 (H) 06/18/2023   CO2 15  (L) 06/18/2023   GLUCOSE 93 06/18/2023   BUN 23 06/18/2023   CREATININE 1.30 (H) 06/18/2023   BILITOT 0.4 06/15/2023   ALKPHOS 49 06/15/2023   AST 42 (H) 06/15/2023   ALT 23 06/15/2023   PROT 5.7 (L) 06/15/2023   ALBUMIN  3.1 (L) 06/15/2023   CALCIUM  7.4 (L) 06/18/2023   GFRAA 38 (L) 07/06/2019    Speciality Comments: No specialty comments available.  Procedures:  No procedures performed Allergies: Povidone-iodine, Sulfa antibiotics, and Sulfamethoxazole   Assessment / Plan:     Visit Diagnoses: Positive ANA (antinuclear antibody)  Raynaud's syndrome without gangrene  Primary osteoarthritis of both hands  History of COPD  Age-related osteoporosis without current pathological fracture  Essential hypertension  CAD in native artery  NSTEMI (non-ST elevated  myocardial infarction) (HCC)  Sinus bradycardia  S/P angioplasty with stent 03/15/16 DES Resolute, pLCX  Dyslipidemia  Atrial flutter with rapid ventricular response (HCC)  Exudative age-related macular degeneration of left eye with active choroidal neovascularization (HCC)  Thrombocytopenia (HCC)  Esophageal dysphagia  CKD (chronic kidney disease), stage IV (HCC)  History of recent fall  Other iron  deficiency anemia  Chronic anticoagulation  Orders: No orders of the defined types were placed in this encounter.  No orders of the defined types were placed in this encounter.   Face-to-face time spent with patient was *** minutes. Greater than 50% of time was spent in counseling and coordination of care.  Follow-Up Instructions: No follow-ups on file.   Romayne Clubs, PA-C  Note - This record has been created using Dragon software.  Chart creation errors have been sought, but may not always  have been located. Such creation errors do not reflect on  the standard of medical care.

## 2023-09-02 NOTE — Progress Notes (Signed)
 Diagnosis: Iron  Deficiency Anemia  Provider:  Cristi Donalds, MD  Procedure: IV Infusion  IV Type: Peripheral, IV Location: L Hand  Venofer  (Iron  Sucrose), Dose: 200 mg  Infusion Start Time: 0932  Infusion stop time: 0938  Post Infusion IV Care: Observation period completed and Subcutaneous infusion needle discontinued  Discharge: Condition: Good, Destination: Home . AVS Provided  Performed by:  Rico Charters, RN

## 2023-09-05 ENCOUNTER — Other Ambulatory Visit: Payer: Self-pay

## 2023-09-05 ENCOUNTER — Emergency Department (HOSPITAL_COMMUNITY)

## 2023-09-05 ENCOUNTER — Encounter (HOSPITAL_COMMUNITY): Payer: Self-pay | Admitting: Emergency Medicine

## 2023-09-05 ENCOUNTER — Inpatient Hospital Stay (HOSPITAL_COMMUNITY)
Admission: EM | Admit: 2023-09-05 | Discharge: 2023-09-12 | DRG: 811 | Disposition: A | Discharge status: Home / Self Care | Attending: Internal Medicine | Admitting: Internal Medicine

## 2023-09-05 ENCOUNTER — Encounter

## 2023-09-05 DIAGNOSIS — Z825 Family history of asthma and other chronic lower respiratory diseases: Secondary | ICD-10-CM

## 2023-09-05 DIAGNOSIS — J8410 Pulmonary fibrosis, unspecified: Secondary | ICD-10-CM | POA: Diagnosis not present

## 2023-09-05 DIAGNOSIS — K222 Esophageal obstruction: Secondary | ICD-10-CM | POA: Diagnosis present

## 2023-09-05 DIAGNOSIS — F418 Other specified anxiety disorders: Secondary | ICD-10-CM | POA: Diagnosis not present

## 2023-09-05 DIAGNOSIS — Z8249 Family history of ischemic heart disease and other diseases of the circulatory system: Secondary | ICD-10-CM

## 2023-09-05 DIAGNOSIS — I252 Old myocardial infarction: Secondary | ICD-10-CM | POA: Diagnosis not present

## 2023-09-05 DIAGNOSIS — Z1152 Encounter for screening for COVID-19: Secondary | ICD-10-CM | POA: Diagnosis not present

## 2023-09-05 DIAGNOSIS — K5900 Constipation, unspecified: Secondary | ICD-10-CM | POA: Diagnosis not present

## 2023-09-05 DIAGNOSIS — D631 Anemia in chronic kidney disease: Secondary | ICD-10-CM | POA: Diagnosis not present

## 2023-09-05 DIAGNOSIS — Z7901 Long term (current) use of anticoagulants: Secondary | ICD-10-CM | POA: Diagnosis not present

## 2023-09-05 DIAGNOSIS — R918 Other nonspecific abnormal finding of lung field: Secondary | ICD-10-CM | POA: Diagnosis not present

## 2023-09-05 DIAGNOSIS — K449 Diaphragmatic hernia without obstruction or gangrene: Secondary | ICD-10-CM | POA: Diagnosis not present

## 2023-09-05 DIAGNOSIS — Z79890 Hormone replacement therapy (postmenopausal): Secondary | ICD-10-CM

## 2023-09-05 DIAGNOSIS — I480 Paroxysmal atrial fibrillation: Secondary | ICD-10-CM | POA: Diagnosis not present

## 2023-09-05 DIAGNOSIS — F419 Anxiety disorder, unspecified: Secondary | ICD-10-CM | POA: Diagnosis present

## 2023-09-05 DIAGNOSIS — E782 Mixed hyperlipidemia: Secondary | ICD-10-CM | POA: Diagnosis not present

## 2023-09-05 DIAGNOSIS — R195 Other fecal abnormalities: Secondary | ICD-10-CM | POA: Diagnosis not present

## 2023-09-05 DIAGNOSIS — E039 Hypothyroidism, unspecified: Secondary | ICD-10-CM | POA: Diagnosis present

## 2023-09-05 DIAGNOSIS — N1832 Chronic kidney disease, stage 3b: Secondary | ICD-10-CM | POA: Diagnosis not present

## 2023-09-05 DIAGNOSIS — K31811 Angiodysplasia of stomach and duodenum with bleeding: Secondary | ICD-10-CM | POA: Diagnosis not present

## 2023-09-05 DIAGNOSIS — R1310 Dysphagia, unspecified: Secondary | ICD-10-CM | POA: Diagnosis not present

## 2023-09-05 DIAGNOSIS — J440 Chronic obstructive pulmonary disease with acute lower respiratory infection: Secondary | ICD-10-CM | POA: Diagnosis not present

## 2023-09-05 DIAGNOSIS — D509 Iron deficiency anemia, unspecified: Secondary | ICD-10-CM | POA: Diagnosis present

## 2023-09-05 DIAGNOSIS — I4891 Unspecified atrial fibrillation: Secondary | ICD-10-CM | POA: Diagnosis not present

## 2023-09-05 DIAGNOSIS — I2510 Atherosclerotic heart disease of native coronary artery without angina pectoris: Secondary | ICD-10-CM | POA: Diagnosis not present

## 2023-09-05 DIAGNOSIS — K31819 Angiodysplasia of stomach and duodenum without bleeding: Secondary | ICD-10-CM | POA: Diagnosis not present

## 2023-09-05 DIAGNOSIS — E785 Hyperlipidemia, unspecified: Secondary | ICD-10-CM | POA: Diagnosis present

## 2023-09-05 DIAGNOSIS — I129 Hypertensive chronic kidney disease with stage 1 through stage 4 chronic kidney disease, or unspecified chronic kidney disease: Secondary | ICD-10-CM | POA: Diagnosis present

## 2023-09-05 DIAGNOSIS — N184 Chronic kidney disease, stage 4 (severe): Secondary | ICD-10-CM | POA: Diagnosis not present

## 2023-09-05 DIAGNOSIS — F32A Depression, unspecified: Secondary | ICD-10-CM | POA: Diagnosis present

## 2023-09-05 DIAGNOSIS — I700 Atherosclerosis of aorta: Secondary | ICD-10-CM | POA: Diagnosis not present

## 2023-09-05 DIAGNOSIS — J189 Pneumonia, unspecified organism: Secondary | ICD-10-CM | POA: Diagnosis not present

## 2023-09-05 DIAGNOSIS — J9601 Acute respiratory failure with hypoxia: Secondary | ICD-10-CM | POA: Diagnosis not present

## 2023-09-05 DIAGNOSIS — Z87891 Personal history of nicotine dependence: Secondary | ICD-10-CM | POA: Diagnosis not present

## 2023-09-05 DIAGNOSIS — K922 Gastrointestinal hemorrhage, unspecified: Secondary | ICD-10-CM | POA: Diagnosis not present

## 2023-09-05 DIAGNOSIS — D649 Anemia, unspecified: Secondary | ICD-10-CM | POA: Diagnosis not present

## 2023-09-05 DIAGNOSIS — K219 Gastro-esophageal reflux disease without esophagitis: Secondary | ICD-10-CM | POA: Diagnosis not present

## 2023-09-05 DIAGNOSIS — Z79899 Other long term (current) drug therapy: Secondary | ICD-10-CM

## 2023-09-05 DIAGNOSIS — I10 Essential (primary) hypertension: Secondary | ICD-10-CM | POA: Diagnosis not present

## 2023-09-05 DIAGNOSIS — K921 Melena: Principal | ICD-10-CM

## 2023-09-05 DIAGNOSIS — D62 Acute posthemorrhagic anemia: Secondary | ICD-10-CM | POA: Diagnosis not present

## 2023-09-05 DIAGNOSIS — I4892 Unspecified atrial flutter: Secondary | ICD-10-CM | POA: Diagnosis present

## 2023-09-05 DIAGNOSIS — J439 Emphysema, unspecified: Secondary | ICD-10-CM | POA: Diagnosis not present

## 2023-09-05 DIAGNOSIS — R0602 Shortness of breath: Secondary | ICD-10-CM | POA: Diagnosis not present

## 2023-09-05 LAB — HEMOGLOBIN AND HEMATOCRIT, BLOOD
HCT: 26.7 % — ABNORMAL LOW (ref 36.0–46.0)
Hemoglobin: 8.5 g/dL — ABNORMAL LOW (ref 12.0–15.0)

## 2023-09-05 LAB — CBC
HCT: 21.2 % — ABNORMAL LOW (ref 36.0–46.0)
Hemoglobin: 5.8 g/dL — CL (ref 12.0–15.0)
MCH: 26.5 pg (ref 26.0–34.0)
MCHC: 27.4 g/dL — ABNORMAL LOW (ref 30.0–36.0)
MCV: 96.8 fL (ref 80.0–100.0)
Platelets: DECREASED 10*3/uL (ref 150–400)
RBC: 2.19 MIL/uL — ABNORMAL LOW (ref 3.87–5.11)
RDW: 24.5 % — ABNORMAL HIGH (ref 11.5–15.5)
WBC: 8.7 10*3/uL (ref 4.0–10.5)
nRBC: 1.3 % — ABNORMAL HIGH (ref 0.0–0.2)

## 2023-09-05 LAB — RESP PANEL BY RT-PCR (RSV, FLU A&B, COVID)  RVPGX2
Influenza A by PCR: NEGATIVE
Influenza B by PCR: NEGATIVE
Resp Syncytial Virus by PCR: NEGATIVE
SARS Coronavirus 2 by RT PCR: NEGATIVE

## 2023-09-05 LAB — BASIC METABOLIC PANEL WITH GFR
Anion gap: 9 (ref 5–15)
BUN: 34 mg/dL — ABNORMAL HIGH (ref 8–23)
CO2: 17 mmol/L — ABNORMAL LOW (ref 22–32)
Calcium: 8.7 mg/dL — ABNORMAL LOW (ref 8.9–10.3)
Chloride: 114 mmol/L — ABNORMAL HIGH (ref 98–111)
Creatinine, Ser: 1.35 mg/dL — ABNORMAL HIGH (ref 0.44–1.00)
GFR, Estimated: 40 mL/min — ABNORMAL LOW (ref >60)
Glucose, Bld: 99 mg/dL (ref 70–99)
Potassium: 4.4 mmol/L (ref 3.5–5.1)
Sodium: 140 mmol/L (ref 135–145)

## 2023-09-05 LAB — HEPATIC FUNCTION PANEL
ALT: 20 U/L (ref 0–44)
AST: 38 U/L (ref 15–41)
Albumin: 2.9 g/dL — ABNORMAL LOW (ref 3.5–5.0)
Alkaline Phosphatase: 41 U/L (ref 38–126)
Bilirubin, Direct: <0.1 mg/dL (ref 0.0–0.2)
Total Bilirubin: 0.5 mg/dL (ref 0.0–1.2)
Total Protein: 5.7 g/dL — ABNORMAL LOW (ref 6.5–8.1)

## 2023-09-05 LAB — BRAIN NATRIURETIC PEPTIDE: B Natriuretic Peptide: 171 pg/mL — ABNORMAL HIGH (ref 0.0–100.0)

## 2023-09-05 LAB — PREPARE RBC (CROSSMATCH)

## 2023-09-05 LAB — MRSA NEXT GEN BY PCR, NASAL: MRSA by PCR Next Gen: NOT DETECTED

## 2023-09-05 LAB — MAGNESIUM: Magnesium: 1.9 mg/dL (ref 1.7–2.4)

## 2023-09-05 LAB — POC OCCULT BLOOD, ED: Fecal Occult Bld: POSITIVE — AB

## 2023-09-05 MED ORDER — ACETAMINOPHEN 325 MG PO TABS
650.0000 mg | ORAL_TABLET | Freq: Four times a day (QID) | ORAL | Status: DC | PRN
  Administered 2023-09-05 – 2023-09-10 (×7): 650 mg via ORAL
  Filled 2023-09-05 (×7): qty 2

## 2023-09-05 MED ORDER — TRAZODONE HCL 50 MG PO TABS
50.0000 mg | ORAL_TABLET | Freq: Every day | ORAL | Status: DC
  Administered 2023-09-05 – 2023-09-11 (×7): 50 mg via ORAL
  Filled 2023-09-05 (×8): qty 1

## 2023-09-05 MED ORDER — ROSUVASTATIN CALCIUM 20 MG PO TABS
20.0000 mg | ORAL_TABLET | Freq: Every day | ORAL | Status: DC
  Administered 2023-09-06 – 2023-09-12 (×7): 20 mg via ORAL
  Filled 2023-09-05 (×7): qty 1

## 2023-09-05 MED ORDER — ONDANSETRON HCL 4 MG/2ML IJ SOLN
4.0000 mg | Freq: Four times a day (QID) | INTRAMUSCULAR | Status: DC | PRN
Start: 2023-09-05 — End: 2023-09-12
  Administered 2023-09-06: 4 mg via INTRAVENOUS
  Filled 2023-09-05: qty 2

## 2023-09-05 MED ORDER — BUPROPION HCL ER (XL) 150 MG PO TB24
150.0000 mg | ORAL_TABLET | Freq: Every day | ORAL | Status: DC
  Administered 2023-09-06 – 2023-09-12 (×7): 150 mg via ORAL
  Filled 2023-09-05 (×7): qty 1

## 2023-09-05 MED ORDER — PANTOPRAZOLE SODIUM 40 MG IV SOLR
40.0000 mg | Freq: Two times a day (BID) | INTRAVENOUS | Status: DC
  Administered 2023-09-05 – 2023-09-10 (×11): 40 mg via INTRAVENOUS
  Filled 2023-09-05 (×11): qty 10

## 2023-09-05 MED ORDER — AMIODARONE HCL 200 MG PO TABS
200.0000 mg | ORAL_TABLET | ORAL | Status: DC
  Administered 2023-09-06 – 2023-09-12 (×6): 200 mg via ORAL
  Filled 2023-09-05 (×6): qty 1

## 2023-09-05 MED ORDER — LEVOTHYROXINE SODIUM 50 MCG PO TABS
50.0000 ug | ORAL_TABLET | Freq: Every day | ORAL | Status: DC
  Administered 2023-09-06 – 2023-09-12 (×7): 50 ug via ORAL
  Filled 2023-09-05 (×3): qty 1
  Filled 2023-09-05: qty 2
  Filled 2023-09-05 (×4): qty 1

## 2023-09-05 MED ORDER — LINACLOTIDE 145 MCG PO CAPS
290.0000 ug | ORAL_CAPSULE | Freq: Every day | ORAL | Status: DC
  Administered 2023-09-07 – 2023-09-12 (×6): 290 ug via ORAL
  Filled 2023-09-05 (×7): qty 2

## 2023-09-05 MED ORDER — CHLORHEXIDINE GLUCONATE CLOTH 2 % EX PADS
6.0000 | MEDICATED_PAD | Freq: Every day | CUTANEOUS | Status: DC
  Administered 2023-09-06 – 2023-09-07 (×2): 6 via TOPICAL

## 2023-09-05 MED ORDER — BUDESON-GLYCOPYRROL-FORMOTEROL 160-9-4.8 MCG/ACT IN AERO
2.0000 | INHALATION_SPRAY | Freq: Two times a day (BID) | RESPIRATORY_TRACT | Status: DC
  Administered 2023-09-05 – 2023-09-12 (×14): 2 via RESPIRATORY_TRACT
  Filled 2023-09-05: qty 5.9

## 2023-09-05 MED ORDER — SERTRALINE HCL 50 MG PO TABS
25.0000 mg | ORAL_TABLET | Freq: Every day | ORAL | Status: DC
  Administered 2023-09-06 – 2023-09-12 (×7): 25 mg via ORAL
  Filled 2023-09-05 (×7): qty 1

## 2023-09-05 MED ORDER — IRON SUCROSE 20 MG/ML IV SOLN: 200.0000 mg | Freq: Once | INTRAVENOUS | Status: DC

## 2023-09-05 MED ORDER — DIPHENHYDRAMINE HCL 25 MG PO CAPS: 25.0000 mg | ORAL_CAPSULE | Freq: Once | ORAL | Status: DC

## 2023-09-05 MED ORDER — SODIUM CHLORIDE 0.9% IV SOLUTION: Freq: Once | INTRAVENOUS | Status: AC

## 2023-09-05 MED ORDER — AMLODIPINE BESYLATE 5 MG PO TABS
5.0000 mg | ORAL_TABLET | Freq: Every day | ORAL | Status: DC
  Administered 2023-09-05 – 2023-09-12 (×8): 5 mg via ORAL
  Filled 2023-09-05 (×8): qty 1

## 2023-09-05 MED ORDER — MIRTAZAPINE 15 MG PO TABS
7.5000 mg | ORAL_TABLET | Freq: Every day | ORAL | Status: DC
  Administered 2023-09-05 – 2023-09-11 (×7): 7.5 mg via ORAL
  Filled 2023-09-05 (×7): qty 1

## 2023-09-05 MED ORDER — ONDANSETRON HCL 4 MG PO TABS: 4.0000 mg | ORAL_TABLET | Freq: Four times a day (QID) | ORAL | Status: DC | PRN

## 2023-09-05 MED ORDER — ACETAMINOPHEN 325 MG PO TABS: 650.0000 mg | ORAL_TABLET | Freq: Once | ORAL | Status: DC

## 2023-09-05 MED ORDER — ACETAMINOPHEN 650 MG RE SUPP
650.0000 mg | Freq: Four times a day (QID) | RECTAL | Status: DC | PRN
Start: 2023-09-05 — End: 2023-09-12

## 2023-09-05 MED ORDER — ALBUTEROL SULFATE (2.5 MG/3ML) 0.083% IN NEBU: 2.5000 mg | INHALATION_SOLUTION | Freq: Four times a day (QID) | RESPIRATORY_TRACT | Status: DC | PRN

## 2023-09-05 NOTE — Plan of Care (Signed)

## 2023-09-05 NOTE — ED Triage Notes (Signed)
 Pt reports SOB x 2 days, hx of iron  infusions

## 2023-09-05 NOTE — H&P (Signed)
 History and Physical    Patient: Patricia Jackson OZH:[id number] DOB: 10-01-1940 DOA: 09/05/2023 DOS: the patient was seen and examined on 09/05/2023 PCP: Omie Bickers, MD   Patient coming from: Home  Chief Complaint:  Chief Complaint  Patient presents with   Shortness of Breath   HPI: Patricia Jackson is a 83 y.o. female with medical history significant of history of atrial fibrillation on chronic anticoagulation, COPD, hypothyroidism, hypertension, hyperlipidemia, chronic kidney disease, dysphagia and anemia of chronic kidney disease and chronic GI bleed; who presented to the hospital secondary to shortness of breath with activity and increased fatigue.  No chest pain, no fever, there is no reports of hematemesis/hematochezia or melena.  No sick contacts, no hematuria, dysuria, focal weaknesses or any other complaints.  Workup in the ED demonstrating positive fecal occult blood test; hemoglobin 5.8.  Of note patient actively receiving iron  infusions as an outpatient.  2 units PRBCs have been ordered; GI service contacted and TR[facility] consulted to place patient in the hospital for further evaluation and management.   Review of Systems: As mentioned in the history of present illness. All other systems reviewed and are negative. Past Medical History:  Diagnosis Date   A-fib [facility])    Atrial flutter (HCC)    On Eliquis  in 8/17 but discontinued after stent placement   Breast cancer (HCC)    remote   CAD in native artery 03/16/2016   COPD (chronic obstructive pulmonary disease) (HCC)    Dysrhythmia    Hypertension    Iron  deficiency anemia 06/12/2021   NSTEMI (non-ST elevated myocardial infarction) (HCC)    2017   S/P angioplasty with stent 03/15/16 DES Resolute, pLCX 03/16/2016   Vitreous hemorrhage of left eye (HCC) 08/01/2019   Past Surgical History:  Procedure Laterality Date   APPENDECTOMY     BREAST SURGERY Left    CARDIAC CATHETERIZATION N/A 03/15/2016   Procedure: Left Heart Cath  and Coronary Angiography;  Surgeon: Arty Binning, MD;  Location: [facility] INVASIVE CV LAB;  Service: Cardiovascular;  Laterality: N/A;   CARDIAC CATHETERIZATION N/A 03/15/2016   Procedure: Coronary Stent Intervention;  Surgeon: Arty Binning, MD;  Location: [facility] INVASIVE CV LAB;  Service: Cardiovascular;  Laterality: N/A;   CARDIOVERSION N/A 02/04/2023   Procedure: CARDIOVERSION;  Surgeon: Lasalle Pointer, MD;  Location: [facility] ORS;  Service: Cardiovascular;  Laterality: N/A;   COLONOSCOPY     remote   COLONOSCOPY N/A 05/26/2016   Procedure: COLONOSCOPY;  Surgeon: Suzette Espy, MD;  Location: [facility] END[facility];  Service: Endoscopy;  Laterality: N/A;  845   COLONOSCOPY N/A 06/17/2023   Procedure: COLONOSCOPY;  Surgeon: Suzette Espy, MD;  Location: [facility];  Service: Endoscopy;  Laterality: N/A;   ESOPHAGOGASTRODUODENOSCOPY N/A 05/26/2016   Procedure: ESOPHAGOGASTRODUODENOSCOPY (EGD);  Surgeon: Suzette Espy, MD;  Location: [facility];  Service: Endoscopy;  Laterality: N/A;   ESOPHAGOGASTRODUODENOSCOPY N/A 06/16/2023   Procedure: EGD (ESOPHAGOGASTRODUODENOSCOPY);  Surgeon: Hargis Lias, MD;  Location: [facility];  Service: Endoscopy;  Laterality: N/A;   ESOPHAGOGASTRODUODENOSCOPY (EGD) WITH PROPOFOL  N/A 11/22/2022   Procedure: ESOPHAGOGASTRODUODENOSCOPY (EGD) WITH PROPOFOL ;  Surgeon: Vinetta Greening, DO;  Location: [facility];  Service: Endoscopy;  Laterality: N/A;   EYE SURGERY     EYE SURGERY Left    Dr. Katheleen Palmer   HEMOSTASIS CLIP PLACEMENT  06/17/2023   Procedure: CONTROL OF HEMORRHAGE, GI TRACT, ENDOSCOPIC, BY CLIPPING OR OVERSEWING;  Surgeon: Suzette Espy, MD;  Location: [facility];  Service: Endoscopy;;   HOT HEMOSTASIS  11/22/2022   Procedure: HOT HEMOSTASIS (ARGON PLASMA COAGULATION/BICAP);  Surgeon: Vinetta Greening, DO;  Location: [facility];  Service: Endoscopy;;   HOT HEMOSTASIS  06/16/2023   Procedure: EGD, WITH ARGON PLASMA COAGULATION;  Surgeon: Hargis Lias, MD;  Location: [facility];  Service: Endoscopy;;   MALONEY DILATION N/A 05/26/2016   Procedure: Londa Rival DILATION;  Surgeon: Suzette Espy, MD;  Location: [facility];  Service: Endoscopy;  Laterality: N/A;   POLYPECTOMY  06/17/2023   Procedure: POLYPECTOMY;  Surgeon: Suzette Espy, MD;  Location: [facility];  Service: Endoscopy;;   SCLEROTHERAPY  06/17/2023   Procedure: Daryle Eon;  Surgeon: Suzette Espy, MD;  Location: [facility];  Service: Endoscopy;;   TEE WITHOUT CARDIOVERSION N/A 02/04/2023   Procedure: TRANSESOPHAGEAL ECHOCARDIOGRAM (TEE);  Surgeon: Mallipeddi, Vishnu P, MD;  Location: [facility];  Service: Cardiovascular;  Laterality: N/A;   TOOTH EXTRACTION N/A 01/15/2022   Procedure: DENTAL RESTORATION/EXTRACTIONS;  Surgeon: Ascencion Lava, DMD;  Location: [facility] OR;  Service: Oral Surgery;  Laterality: N/A;   VITRECTOMY Left 10/03/2019   Dr. Seward Dao, Vitrectomy, Focal Laser, Removal of Silicone Oil   Social History:  reports that she quit smoking about 7 years ago. Her smoking use included cigarettes. She has been exposed to tobacco smoke. She has never used smokeless tobacco. She reports that she does not drink alcohol and does not use drugs.  Allergies  Allergen Reactions   Povidone-Iodine Other (See Comments)    Burning   Sulfa Antibiotics Other (See Comments)    Unknown reaction    Sulfamethoxazole Other (See Comments)    unknown    Family History  Problem Relation Age of Onset   Heart disease Mother    COPD Father    Cancer Sister        unknown primary   Cancer Brother        unknown primary   Cancer Brother    Hypertension Son    Colon cancer Neg Hx     Prior to Admission medications   Medication Sig Start Date End Date Taking? Authorizing Provider  albuterol  (VENTOLIN  HFA) 108 (90 Base) MCG/ACT inhaler Inhale into the lungs. 08/11/23  Yes [provider]  amLODipine  (NORVASC ) 5 MG tablet Take 5 mg by mouth daily. 08/15/23  Yes  [provider]  clobetasol (TEMOVATE) 0.05 % external solution Apply 1 Application topically 2 (two) times daily as needed. 06/30/23  Yes [provider]  mirtazapine (REMERON) 15 MG tablet Take 7.5 mg by mouth daily. 08/11/23  Yes [provider]  traZODone (DESYREL) 50 MG tablet Take 50 mg by mouth daily. 08/31/23  Yes [provider]  acetaminophen  (TYLENOL ) 500 MG tablet Take 1,000 mg by mouth every 8 (eight) hours as needed for mild pain or moderate pain. 03/21/17   [provider]  albuterol  (PROVENTIL ) (2.5 MG/3ML) 0.083% nebulizer solution Take 2.5 mg by nebulization every 6 (six) hours as needed for shortness of breath or wheezing.    [provider]  amiodarone  (PACERONE ) 200 MG tablet Take 1 Tablet ( 200 mg ) Daily and none on Sunday 05/04/23   Tammie Fall, MD  apixaban  (ELIQUIS ) 2.5 MG TABS tablet Take 1 tablet (2.5 mg total) by mouth 2 (two) times daily. 06/27/23 12/24/23  Justina Oman, MD  buPROPion  (WELLBUTRIN  XL) 150 MG 24 hr tablet Take 150 mg by mouth daily. 11/18/22   [provider]  Cholecalciferol (VITAMIN D3) 50 MCG (2000 UT) TABS Take 2,000 Units by mouth daily.    [provider]  levothyroxine  (SYNTHROID ) 50 MCG tablet Take 50 mcg by mouth daily before breakfast.  09/20/19   [provider]  linaclotide  (LINZESS ) 290 MCG CAPS capsule Take 1 capsule (290 mcg total) by mouth daily before breakfast. 06/19/23   Justina Oman, MD  nitroGLYCERIN  (NITROSTAT ) 0.4 MG SL tablet Place 0.4 mg under the tongue every 5 (five) minutes as needed for chest pain. Patient not taking: Reported on 06/27/2023 06/09/19   [provider]  pantoprazole  (PROTONIX ) 40 MG tablet Take 1 tablet (40 mg total) by mouth daily. 06/18/23 06/17/24  Justina Oman, MD  rosuvastatin  (CRESTOR ) 20 MG tablet Take 20 mg by mouth daily.  Patient not taking: Reported on 06/27/2023 02/26/17   [provider]  sertraline  (ZOLOFT )  25 MG tablet Take 25 mg by mouth daily.    [provider]  Gaylan Kaufman 200-62.5-25 MCG/ACT AEPB Inhale 1 puff into the lungs daily. 11/02/22   [provider]    Physical Exam: Vitals:   09/05/23 1513 09/05/23 1530 09/05/23 1559 09/05/23 1600  BP: (!) 144/41 (!) 147/49 (!) 145/46 (!) 145/46  Pulse: 81 83 78 76  Resp: (!) 21 (!) 24 (!) 22 17  Temp: 97.8 F (36.6 C) 98 F (36.7 C) 98.1 F (36.7 C)   TempSrc: Oral Oral    SpO2: 100% 99%  98%  Weight:      Height:       General exam: Alert, awake, oriented x 3; no chest pain, no nausea vomiting. Respiratory system: Good air movement bilaterally.  Respiratory effort normal.  Good saturation on room air. Cardiovascular system: Rate controlled, no rubs, no gallops. Gastrointestinal system: Abdomen is nondistended, soft and nontender. No organomegaly or masses felt. Normal bowel sounds heard. Central nervous system: No focal neurological deficits. Extremities: No cyanosis or clubbing. Skin: No petechiae. Psychiatry: Judgement and insight appear normal. Mood & affect appropriate.   Data Reviewed: 1-acute on chronic anemia  Assessment and Plan: 1-acute on chronic anemia - In the setting of GI bleed (possible AVMs); complaining of chronic anemia from renal failure also playing a role. - Receiving IV iron  as an outpatient - 2 units PRBCs will be provided - Continue IV PPI - N.p.o. after midnight for endoscopy/enteroscopy by GI service. - Continue holding Eliquis . - Follow hemoglobin trend.  2-dysphagia - Patient reports intermittent dysphagia to solid food - Possible dilatation if needed during endoscopy evaluation on 09/06/2023. - Continue PPI.  3-paroxysmal atrial fibrillation - Holding Eliquis  secondary to GI bleed - Continue the use of amiodarone  for rate control.  4-hypertension - Continue treatment with amlodipine   5-hypothyroidism - Continue Synthroid   6-chronic kidney disease stage IV - Appears  to be stable and at baseline - Continue minimizing/avoiding nephrotoxic agents, contrast and hypotension - Maintain adequate hydration - Follow-up renal function trend.  7-history of COPD - No acute exacerbation currently appreciated - Continue bronchodilator management.  8-depression/anxiety - Continue treatment with Wellbutrin  and sertraline   Advance Care Planning:   Code Status: Full Code   Consults: Gastroenterology service.  Family Communication: No family at bedside.  Severity of Illness: The appropriate patient status for this patient is OBSERVATION. Observation status is judged to be reasonable and necessary in order to provide the required intensity of service to ensure the patient's safety. The patient's presenting symptoms, physical exam findings, and initial radiographic and laboratory data in  the context of their medical condition is felt to place them at decreased risk for further clinical deterioration. Furthermore, it is anticipated that the patient will be medically stable for discharge from the hospital within 2 midnights of admission.   Author: Justina Oman, MD 09/05/2023 5:40 PM  For on call review [email or web address].

## 2023-09-05 NOTE — Consult Note (Signed)
 Gastroenterology Consult   Referring Provider: Cristine Done ED Primary Care Physician:  Omie Bickers, MD Primary Gastroenterologist:  Dr. Riley Cheadle   Patient ID: Patricia Jackson; [id number]; 1940-07-05   Admit date: 09/05/2023  LOS: 0 days   Date of Consultation: 09/05/2023  Reason for Consultation:  Acute on chronic anemia  History of Present Illness   Patricia Jackson is an 83 y.o. year old female with a history of atrial flutter/A-fib on Eliquis , IDA, CAD, COPD, HTN, breast cancer, CKD NSTEMI with DES in 2017, recurrent profound anemia multifactorial requiring blood transfusions in Aug 2024 and March 2025, now presenting with recurrent profound symptomatic anemia with Hgb 5.8, down from 8.8 at last hospitalization. Colonoscopy and EGD last performed during prior hospitalization and felt IDA multifactorial in setting of gastric and duodenal angioectasias, large polyp burden on colonoscopy with large polyps and poor prep, unable to exclude more distal small bowel source contributing. She presented to the ED today with worsening fatigue, weakness over past 1-2 weeks in light of acute on chronic anemia on Eliquis .    Pertinent labs in ED: Hgb 5.8, down from 8.8 in March 2025, BNP 171, heme positive. 2 units PRBCs have been ordered.  Imaging: CXR with emphysema, patchy airspace opacities possible representing atelectasis or developing bronchopneumonia.   At time of consultation: Patient notes approximately week long worsening weakness and fatigue, with her family encouraging her to go to the ED. She notes dark stool when taking Linzess  for constipation. No chest pain. Felt short-winded with short distances. No NSAIDs. Takes Tylenol  occasionally. Pantoprazole  40 mg daily on outpatient list but does not know if she is taking this. At times will feel food hang in esophagus and then go down. No abdominal pain. No N/V. No bright red blood per rectum. Linzess  290 mcg for constipation working well for her.    Last dose of Eliquis  yesterday evening.      Prior Endoscopic Procedures:   EGD March 2025: normal esophagus, single non-bleeding angioectasia s/p APC, 2 non-bleeding angioectasias in duodenum s/p APC.    Colonoscopy March 2025: poor prep, diverticulosis, multiple polyps with six 8-30 mm polyps at IC valve removed with hot snare, 15x 30 mm polyp distal to IC valve with removal and clip placement, ink marked. Colonoscopy in 6 months (Sept 2025). Path with tubular adenomas.    EGD August 2024: -Nonobstructing Schatzki's ring -Small hiatal hernia -Irregular Z-line -Single bleeding angiectasia in the stomach s/p APC -2 nonbleeding angiectasia in the stomach s/p APC -Single nonbleeding angioectasia in the second portion of duodenum -Advise outpatient colonoscopy, start oral iron  therapy.  Continue PPI BID     Colonoscopy Feb 2018:multiple diverticula found in the sigmoid and descending colon. 2 sessile polyps removed from the hepatic flexure measuring 5-6 mm in size. Pathology revealed tubular adenomas   EGD FEb 2018: LA grade a esophagitis, no Barrett's. Esophagus was dilated with a 54 [place] Maloney dilator. She has small hiatal hernia    Past Medical History:  Diagnosis Date   A-fib [facility])    Atrial flutter (HCC)    On Eliquis  in 8/17 but discontinued after stent placement   Breast cancer (HCC)    remote   CAD in native artery 03/16/2016   COPD (chronic obstructive pulmonary disease) (HCC)    Dysrhythmia    Hypertension    Iron  deficiency anemia 06/12/2021   NSTEMI (non-ST elevated myocardial infarction) (HCC)    2017   S/P angioplasty with stent 03/15/16 DES Resolute, pLCX 03/16/2016  Vitreous hemorrhage of left eye (HCC) 08/01/2019    Past Surgical History:  Procedure Laterality Date   APPENDECTOMY     BREAST SURGERY Left    CARDIAC CATHETERIZATION N/A 03/15/2016   Procedure: Left Heart Cath and Coronary Angiography;  Surgeon: Arty Binning, MD;  Location: [facility]  INVASIVE CV LAB;  Service: Cardiovascular;  Laterality: N/A;   CARDIAC CATHETERIZATION N/A 03/15/2016   Procedure: Coronary Stent Intervention;  Surgeon: Arty Binning, MD;  Location: [facility] INVASIVE CV LAB;  Service: Cardiovascular;  Laterality: N/A;   CARDIOVERSION N/A 02/04/2023   Procedure: CARDIOVERSION;  Surgeon: Lasalle Pointer, MD;  Location: [facility] ORS;  Service: Cardiovascular;  Laterality: N/A;   COLONOSCOPY     remote   COLONOSCOPY N/A 05/26/2016   Procedure: COLONOSCOPY;  Surgeon: Suzette Espy, MD;  Location: [facility];  Service: Endoscopy;  Laterality: N/A;  845   COLONOSCOPY N/A 06/17/2023   Procedure: COLONOSCOPY;  Surgeon: Suzette Espy, MD;  Location: [facility];  Service: Endoscopy;  Laterality: N/A;   ESOPHAGOGASTRODUODENOSCOPY N/A 05/26/2016   Procedure: ESOPHAGOGASTRODUODENOSCOPY (EGD);  Surgeon: Suzette Espy, MD;  Location: [facility];  Service: Endoscopy;  Laterality: N/A;   ESOPHAGOGASTRODUODENOSCOPY N/A 06/16/2023   Procedure: EGD (ESOPHAGOGASTRODUODENOSCOPY);  Surgeon: Hargis Lias, MD;  Location: [facility];  Service: Endoscopy;  Laterality: N/A;   ESOPHAGOGASTRODUODENOSCOPY (EGD) WITH PROPOFOL  N/A 11/22/2022   Procedure: ESOPHAGOGASTRODUODENOSCOPY (EGD) WITH PROPOFOL ;  Surgeon: Vinetta Greening, DO;  Location: [facility];  Service: Endoscopy;  Laterality: N/A;   EYE SURGERY     EYE SURGERY Left    Dr. Katheleen Palmer   HEMOSTASIS CLIP PLACEMENT  06/17/2023   Procedure: CONTROL OF HEMORRHAGE, GI TRACT, ENDOSCOPIC, BY CLIPPING OR OVERSEWING;  Surgeon: Suzette Espy, MD;  Location: [facility];  Service: Endoscopy;;   HOT HEMOSTASIS  11/22/2022   Procedure: HOT HEMOSTASIS (ARGON PLASMA COAGULATION/BICAP);  Surgeon: Vinetta Greening, DO;  Location: [facility];  Service: Endoscopy;;   HOT HEMOSTASIS  06/16/2023   Procedure: EGD, WITH ARGON PLASMA COAGULATION;  Surgeon: Hargis Lias, MD;  Location: [facility];  Service: Endoscopy;;   MALONEY  DILATION N/A 05/26/2016   Procedure: Londa Rival DILATION;  Surgeon: Suzette Espy, MD;  Location: [facility];  Service: Endoscopy;  Laterality: N/A;   POLYPECTOMY  06/17/2023   Procedure: POLYPECTOMY;  Surgeon: Suzette Espy, MD;  Location: [facility];  Service: Endoscopy;;   SCLEROTHERAPY  06/17/2023   Procedure: Daryle Eon;  Surgeon: Suzette Espy, MD;  [facility]: [facility];  Service: Endoscopy;;   TEE WITHOUT CARDIOVERSION N/A 02/04/2023   Procedure: TRANSESOPHAGEAL ECHOCARDIOGRAM (TEE);  Surgeon: Mallipeddi, Vishnu P, MD;  Location: [facility];  Service: Cardiovascular;  Laterality: N/A;   TOOTH EXTRACTION N/A 01/15/2022   Procedure: DENTAL RESTORATION/EXTRACTIONS;  Surgeon: Ascencion Lava, DMD;  Location: [facility] OR;  Service: Oral Surgery;  Laterality: N/A;   VITRECTOMY Left 10/03/2019   Dr. Seward Dao, Vitrectomy, Focal Laser, Removal of Silicone Oil    Prior to Admission medications   Medication Sig Start Date End Date Taking? Authorizing Provider  acetaminophen  (TYLENOL ) 500 MG tablet Take 1,000 mg by mouth every 8 (eight) hours as needed for mild pain or moderate pain. 03/21/17   [provider]  amiodarone  (PACERONE ) 200 MG tablet Take 1 Tablet ( 200 mg ) Daily and none on Sunday 05/04/23   Tammie Fall, MD  apixaban  (ELIQUIS ) 2.5 MG TABS tablet Take 1 tablet (  2.5 mg total) by mouth 2 (two) times daily. 06/27/23 12/24/23  Justina Oman, MD  buPROPion  (WELLBUTRIN  XL) 150 MG 24 hr tablet Take 150 mg by mouth daily. 11/18/22   [provider]  Cholecalciferol (VITAMIN D3) 50 MCG (2000 UT) TABS Take 2,000 Units by mouth daily.    [provider]  levothyroxine  (SYNTHROID ) 50 MCG tablet Take 50 mcg by mouth daily before breakfast.  09/20/19   [provider]  linaclotide  (LINZESS ) 290 MCG CAPS capsule Take 1 capsule (290 mcg total) by mouth daily before breakfast. 06/19/23   Justina Oman, MD  nitroGLYCERIN  (NITROSTAT ) 0.4 MG SL tablet Place 0.4 mg under  the tongue every 5 (five) minutes as needed for chest pain. Patient not taking: Reported on 06/27/2023 06/09/19   [provider]  pantoprazole  (PROTONIX ) 40 MG tablet Take 1 tablet (40 mg total) by mouth daily. 06/18/23 06/17/24  Justina Oman, MD  rosuvastatin  (CRESTOR ) 20 MG tablet Take 20 mg by mouth daily.  Patient not taking: Reported on 06/27/2023 02/26/17   [provider]  sertraline  (ZOLOFT ) 25 MG tablet Take 25 mg by mouth daily.    [provider]  Gaylan Kaufman 200-62.5-25 MCG/ACT AEPB Inhale 1 puff into the lungs daily. 11/02/22   [provider]    Current Facility-Administered Medications  Medication Dose Route Frequency Provider Last Rate Last Admin   acetaminophen  (TYLENOL ) tablet 650 mg  650 mg Oral Once Cristi Donalds, MD       diphenhydrAMINE  (BENADRYL ) capsule 25 mg  25 mg Oral Once Cristi Donalds, MD       pantoprazole  (PROTONIX ) injection 40 mg  40 mg Intravenous Q12H Iva Mariner, MD   40 mg at 09/05/23 1151   Current Outpatient Medications  Medication Sig Dispense Refill   acetaminophen  (TYLENOL ) 500 MG tablet Take 1,000 mg by mouth every 8 (eight) hours as needed for mild pain or moderate pain.     amiodarone  (PACERONE ) 200 MG tablet Take 1 Tablet ( 200 mg ) Daily and none on Sunday 90 tablet 3   apixaban  (ELIQUIS ) 2.5 MG TABS tablet Take 1 tablet (2.5 mg total) by mouth 2 (two) times daily.     buPROPion  (WELLBUTRIN  XL) 150 MG 24 hr tablet Take 150 mg by mouth daily.     Cholecalciferol (VITAMIN D3) 50 MCG (2000 UT) TABS Take 2,000 Units by mouth daily.     levothyroxine  (SYNTHROID ) 50 MCG tablet Take 50 mcg by mouth daily before breakfast.      linaclotide  (LINZESS ) 290 MCG CAPS capsule Take 1 capsule (290 mcg total) by mouth daily before breakfast. 30 capsule 1   nitroGLYCERIN  (NITROSTAT ) 0.4 MG SL tablet Place 0.4 mg under the tongue every 5 (five) minutes as needed for chest pain. (Patient not taking: Reported on 06/27/2023)      pantoprazole  (PROTONIX ) 40 MG tablet Take 1 tablet (40 mg total) by mouth daily. 30 tablet 1   rosuvastatin  (CRESTOR ) 20 MG tablet Take 20 mg by mouth daily.  (Patient not taking: Reported on 06/27/2023)     sertraline  (ZOLOFT ) 25 MG tablet Take 25 mg by mouth daily.     TRELEGY ELLIPTA 200-62.5-25 MCG/ACT AEPB Inhale 1 puff into the lungs daily.     Facility-Administered Medications Ordered in Other Encounters  Medication Dose Route Frequency Provider Last Rate Last Admin   iron  sucrose (VENOFER ) injection 200 mg  200 mg Intravenous Once Cristi Donalds, MD        Allergies as of  09/05/2023 - Review Complete 06/27/2023  Allergen Reaction Noted   Povidone-iodine Other (See Comments) 12/20/2022   Sulfa antibiotics Other (See Comments) 08/02/2013   Sulfamethoxazole Other (See Comments) 03/23/2017    Family History  Problem Relation Age of Onset   Heart disease Mother    COPD Father    Cancer Sister        unknown primary   Cancer Brother        unknown primary   Cancer Brother    Hypertension Son    Colon cancer Neg Hx     Social History   Socioeconomic History   Marital status: Divorced    Spouse name: Not on file   Number of children: 1   Years of education: Not on file   Highest education level: Not on file  Occupational History   Occupation: Retired  Tobacco Use   Smoking status: Former    Current packs/day: 0.00    Types: Cigarettes    Quit date: 12/28/2015    Years since quitting: 7.6    Passive exposure: Current   Smokeless tobacco: Never   Tobacco comments:    Patient quit within the last 10 years  Vaping Use   Vaping status: Never Used  Substance and Sexual Activity   Alcohol use: Never   Drug use: Never   Sexual activity: Not Currently  Other Topics Concern   Not on file  Social History Narrative   ** Merged History Encounter **       Social Drivers of Health   Financial Resource Strain: Low Risk  (11/26/2022)   Overall Financial Resource Strain  (CARDIA)    Difficulty of Paying Living Expenses: Not hard at all  Food Insecurity: No Food Insecurity (06/20/2023)   Hunger Vital Sign    Worried About Running Out of Food in the Last Year: Never true    Ran Out of Food in the Last Year: Never true  Transportation Needs: No Transportation Needs (06/20/2023)   PRAPARE - Administrator, Civil Service (Medical): No    Lack of Transportation (Non-Medical): No  Physical Activity: Not on file  Stress: No Stress Concern Present (11/26/2022)   [employer] of Occupational Health - Occupational Stress Questionnaire    Feeling of Stress : Not at all  Social Connections: Unknown (06/16/2023)   Social Connection and Isolation Panel [NHANES]    Frequency of Communication with Friends and Family: Twice a week    Frequency of Social Gatherings with Friends and Family: Twice a week    Attends Religious Services: 1 to 4 times per year    Active Member of [employer] or Organizations: Yes    Attends Banker Meetings: Never    Marital Status: Patient declined  Catering manager Violence: Not At Risk (06/20/2023)   Humiliation, Afraid, Rape, and Kick questionnaire    Fear of Current or Ex-Partner: No    Emotionally Abused: No    Physically Abused: No    Sexually Abused: No     Review of Systems   Gen: see HPI CV: Denies chest pain, heart palpitations, syncope, edema  Resp: see HPI GI: see HPI GU : Denies urinary burning, blood in urine, urinary frequency, and urinary incontinence. MS: Denies joint pain, limitation of movement, swelling, cramps, and atrophy.  Derm: Denies rash, itching, dry skin, hives. Psych: Denies depression, anxiety, memory loss, hallucinations, and confusion. Heme: see HPI Neuro:  Denies any headaches, dizziness, paresthesias, shaking  Physical Exam  Vital Signs in last 24 hours: Temp:  [97.6 F (36.4 C)] 97.6 F (36.4 C) (06/02 1240) Pulse Rate:  [73-75] 74 (06/02 1240) Resp:  [14-17] 14 (06/02  1240) BP: (124-160)/(48-68) 149/59 (06/02 1240) SpO2:  [92 %-98 %] 96 % (06/02 1240) Weight:  [59.4 kg] 59.4 kg (06/02 0952)    General:   Alert,  Well-developed, well-nourished, pale Head:  Normocephalic and atraumatic. Eyes:  Sclera clear, no icterus.  Ears:  hard of hearing Mouth:  No deformity or lesions, dentition normal. Lungs:  Clear throughout to auscultation.   Heart:  S1 S2 present without obvious murmurs Abdomen:  Soft, nontender and nondistended. No masses, hepatosplenomegaly or hernias noted. Normal bowel sounds, without guarding, and without rebound.   Rectal: deferred   Msk:  Symmetrical without gross deformities. Normal posture. Extremities:  Without edema. Neurologic:  Alert and  oriented x4. Psych:  Alert and cooperative. Normal mood and affect.  Intake/Output from previous day: No intake/output data recorded. Intake/Output this shift: Total I/O In: 10 [I.V.:10] Out: -    Labs/Studies   Recent Labs Recent Labs    09/05/23 1012  WBC 8.7  HGB 5.8*  HCT 21.2*  PLT PLATELET CLUMPS NOTED ON SMEAR, COUNT APPEARS DECREASED   BMET Recent Labs    09/05/23 1012  NA 140  K 4.4  CL 114*  CO2 17*  GLUCOSE 99  BUN 34*  CREATININE 1.35*  CALCIUM  8.7*     Radiology/Studies DG Chest 2 View Result Date: 09/05/2023 CLINICAL DATA:  SOB EXAM: CHEST - 2 VIEW COMPARISON:  June 15, 2023 FINDINGS: Chronic coarsening of the pulmonary interstitium with flattening of the diaphragms, likely due to underlying emphysema. Patchy airspace opacities in the right mid lung zone and both lung bases. No pleural effusion or pneumothorax. No cardiomegaly. Tortuous aorta with aortic atherosclerosis. No acute fracture or destructive lesions. Multilevel thoracic osteophytosis. Surgical clips in the left axilla. IMPRESSION: Emphysema. Patchy airspace opacities in the right mid and both lower lung zones, which may represent atelectasis or a developing bronchopneumonia, in the correct  clinical context. Electronically Signed   By: Rance Burrows M.D.   On: 09/05/2023 12:07     Assessment   Patricia Jackson is an 83 y.o. year old female with a history of atrial flutter/A-fib on Eliquis , IDA, CAD, COPD, HTN, breast cancer, CKD NSTEMI with DES in 2017, recurrent profound anemia requiring blood transfusions in Aug 2024 and March 2025, now presenting with recurrent profound symptomatic anemia with Hgb 5.8, down from 8.8 at last hospitalization. GI consulted due to profound anemia and heme positive stool.  Acute on chronic anemia: on Eliquis  with last dose yesterday evening. 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 She does not have obvious melena. Suspect AVMs as culprit. Will arrange EGD with enteroscopy on 6/3. Continue to hold Eliquis  and transfuse as planned.   Dysphagia: vague solid food dysphagia intermittently. Can consider empiric dilation at time of procedure.   She will need outpatient colonoscopy in about 3 months with extended prep due to poor visualization during last colonoscopy.     Plan / Recommendations    Continue to hold Eliquis  PPI IV BID 2 units PRBCs May have soft diet. NPO after midnight Plan for  EGD/enteroscopy on 6/3. Possible esophageal dilation if needed.  Consider sandostatin as outpatient going forward Will need colonoscopy in 3 months     09/05/2023, 12:45 PM  Delman Ferns, PhD, ANP-BC [facility]

## 2023-09-05 NOTE — ED Notes (Signed)
 Dr Mordechai April, with GI, present at bedside to discuss admission with the Pt at this time.

## 2023-09-05 NOTE — ED Provider Notes (Signed)
 Patricia Jackson Provider Note   CSN: [id number] Arrival date & time: 09/05/23  0932     History  Chief Complaint  Patient presents with   Shortness of Breath    Patricia Jackson is a 83 y.o. female.   Shortness of Breath Patient presents for shortness of breath.  Medical history includes HTN, atrial flutter, pulmonary fibrosis, HLD, CAD, anemia, CKD, COPD, GERD.  She has recently been undergoing blood transfusions and iron  infusions for symptomatic anemia.  In March, she underwent endoscopic which showed AVMs.  These were treated with APC's.  Over the past week, she has had worsening exertional shortness of breath.  She denies any shortness of breath at rest.  She denies any areas of discomfort.  Symptoms are reminiscent of episodes of anemia.       Home Medications Prior to Admission medications   Medication Sig Start Date End Date Taking? Authorizing Provider  albuterol  (VENTOLIN  HFA) 108 (90 Base) MCG/ACT inhaler Inhale into the lungs. 08/11/23  Yes [provider]  amLODipine  (NORVASC ) 5 MG tablet Take 5 mg by mouth daily. 08/15/23  Yes [provider]  clobetasol (TEMOVATE) 0.05 % external solution Apply 1 Application topically 2 (two) times daily as needed. 06/30/23  Yes [provider]  mirtazapine (REMERON) 15 MG tablet Take 7.5 mg by mouth daily. 08/11/23  Yes [provider]  traZODone (DESYREL) 50 MG tablet Take 50 mg by mouth daily. 08/31/23  Yes [provider]  acetaminophen  (TYLENOL ) 500 MG tablet Take 1,000 mg by mouth every 8 (eight) hours as needed for mild pain or moderate pain. 03/21/17   [provider]  albuterol  (PROVENTIL ) (2.5 MG/3ML) 0.083% nebulizer solution Take 2.5 mg by nebulization every 6 (six) hours as needed for shortness of breath or wheezing.    [provider]  amiodarone  (PACERONE ) 200 MG tablet Take 1 Tablet ( 200 mg ) Daily and none on Sunday 05/04/23    Tammie Fall, MD  apixaban  (ELIQUIS ) 2.5 MG TABS tablet Take 1 tablet (2.5 mg total) by mouth 2 (two) times daily. 06/27/23 12/24/23  Justina Oman, MD  buPROPion  (WELLBUTRIN  XL) 150 MG 24 hr tablet Take 150 mg by mouth daily. 11/18/22   [provider]  Cholecalciferol (VITAMIN D3) 50 MCG (2000 UT) TABS Take 2,000 Units by mouth daily.    [provider]  levothyroxine  (SYNTHROID ) 50 MCG tablet Take 50 mcg by mouth daily before breakfast.  09/20/19   [provider]  linaclotide  (LINZESS ) 290 MCG CAPS capsule Take 1 capsule (290 mcg total) by mouth daily before breakfast. 06/19/23   Justina Oman, MD  nitroGLYCERIN  (NITROSTAT ) 0.4 MG SL tablet Place 0.4 mg under the tongue every 5 (five) minutes as needed for chest pain. Patient not taking: Reported on 06/27/2023 06/09/19   [provider]  pantoprazole  (PROTONIX ) 40 MG tablet Take 1 tablet (40 mg total) by mouth daily. 06/18/23 06/17/24  Justina Oman, MD  rosuvastatin  (CRESTOR ) 20 MG tablet Take 20 mg by mouth daily.  Patient not taking: Reported on 06/27/2023 02/26/17   [provider]  sertraline  (ZOLOFT ) 25 MG tablet Take 25 mg by mouth daily.    [provider]  Gaylan Kaufman 200-62.5-25 MCG/ACT AEPB Inhale 1 puff into the lungs daily. 11/02/22   [provider]      Allergies    Povidone-iodine, Sulfa antibiotics, and Sulfamethoxazole    Review of Systems   Review of Systems  Constitutional:  Positive for fatigue.  Respiratory:  Positive for shortness of breath.   All other systems reviewed and are negative.   Physical Exam Updated Vital Signs BP (!) 147/49   Pulse 83   Temp 98 F (36.7 C) (Oral)   Resp (!) 24   Ht 5\' 7"  (1.702 m)   Wt 59.4 kg   SpO2 99%   BMI 20.52 kg/m  Physical Exam Vitals and nursing note reviewed.  Constitutional:      General: She is not in acute distress.    Appearance: She is well-developed. She is not ill-appearing, toxic-appearing  or diaphoretic.  HENT:     Head: Normocephalic and atraumatic.     Mouth/Throat:     Mouth: Mucous membranes are moist.  Eyes:     Conjunctiva/sclera: Conjunctivae normal.  Cardiovascular:     Rate and Rhythm: Normal rate and regular rhythm.     Heart sounds: No murmur heard. Pulmonary:     Effort: Pulmonary effort is normal. No respiratory distress.     Breath sounds: Normal breath sounds. No decreased breath sounds, wheezing, rhonchi or rales.  Chest:     Chest wall: No tenderness.  Abdominal:     Palpations: Abdomen is soft.     Tenderness: There is no abdominal tenderness.  Musculoskeletal:        General: No swelling. Normal range of motion.     Cervical back: Normal range of motion and neck supple.     Right lower leg: No edema.     Left lower leg: No edema.  Skin:    General: Skin is warm and dry.     Coloration: Skin is pale. Skin is not cyanotic.  Neurological:     General: No focal deficit present.     Mental Status: She is alert and oriented to person, place, and time.  Psychiatric:        Mood and Affect: Mood normal.        Behavior: Behavior normal.     ED Results / Procedures / Treatments   Labs (all labs ordered are listed, but only abnormal results are displayed) Labs Reviewed  BASIC METABOLIC PANEL WITH GFR - Abnormal; Notable for the following components:      Result Value   Chloride 114 (*)    CO2 17 (*)    BUN 34 (*)    Creatinine, Ser 1.35 (*)    Calcium  8.7 (*)    GFR, Estimated 40 (*)    All other components within normal limits  CBC - Abnormal; Notable for the following components:   RBC 2.19 (*)    Hemoglobin 5.8 (*)    HCT 21.2 (*)    MCHC 27.4 (*)    RDW 24.5 (*)    nRBC 1.3 (*)    All other components within normal limits  BRAIN NATRIURETIC PEPTIDE - Abnormal; Notable for the following components:   B Natriuretic Peptide 171.0 (*)    All other components within normal limits  HEPATIC FUNCTION PANEL - Abnormal; Notable for the  following components:   Total Protein 5.7 (*)    Albumin  2.9 (*)    All other components within normal limits  POC OCCULT BLOOD, ED - Abnormal; Notable for the following components:   Fecal Occult Bld POSITIVE (*)    All other components within normal limits  RESP PANEL BY RT-PCR (RSV, FLU A&B, COVID)  RVPGX2  MRSA NEXT GEN BY PCR, NASAL  MAGNESIUM   TYPE AND  SCREEN  PREPARE RBC (CROSSMATCH)    EKG EKG Interpretation Date/Time:  Monday September 05 2023 10:02:04 EDT Ventricular Rate:  77 PR Interval:  158 QRS Duration:  88 QT Interval:  404 QTC Calculation: 457 R Axis:   80  Text Interpretation: Normal sinus rhythm Confirmed by Iva Mariner [phone]) on 09/05/2023 10:06:06 AM  Radiology DG Chest 2 View Result Date: 09/05/2023 CLINICAL DATA:  SOB EXAM: CHEST - 2 VIEW COMPARISON:  June 15, 2023 FINDINGS: Chronic coarsening of the pulmonary interstitium with flattening of the diaphragms, likely due to underlying emphysema. Patchy airspace opacities in the right mid lung zone and both lung bases. No pleural effusion or pneumothorax. No cardiomegaly. Tortuous aorta with aortic atherosclerosis. No acute fracture or destructive lesions. Multilevel thoracic osteophytosis. Surgical clips in the left axilla. IMPRESSION: Emphysema. Patchy airspace opacities in the right mid and both lower lung zones, which may represent atelectasis or a developing bronchopneumonia, in the correct clinical context. Electronically Signed   By: Rance Burrows M.D.   On: 09/05/2023 12:07    Procedures Procedures    Medications Ordered in ED Medications  pantoprazole  (PROTONIX ) injection 40 mg (40 mg Intravenous Given 09/05/23 1151)  Chlorhexidine  Gluconate Cloth 2 % PADS 6 each (has no administration in time range)  0.9 %  sodium chloride  infusion (Manually program via Guardrails IV Fluids) ( Intravenous New Bag/Given 09/05/23 1151)    ED Course/ Medical Decision Making/ A&P                                 Medical  Decision Making Amount and/or Complexity of Data Reviewed Labs: ordered. Radiology: ordered.  Risk Prescription drug management. Decision regarding hospitalization.   This patient presents to the ED for concern of exertional shortness of breath, this involves an extensive number of treatment options, and is a complaint that carries with it a high risk of complications and morbidity.  The differential diagnosis includes anemia, acidosis, CHF, reactive airway disease exacerbation, pneumonia   Co morbidities / Chronic conditions that complicate the patient evaluation  HTN, atrial flutter, pulmonary fibrosis, HLD, CAD, anemia, CKD, COPD, GERD   Additional history obtained:  Additional history obtained from EMR External records from outside source obtained and reviewed including in a   Lab Tests:  I Ordered, and personally interpreted labs.  The pertinent results include: Confirmation of acute on chronic anemia.  No leukocytosis is present.  Creatinine and non-anion gap metabolic acidosis are baseline.   Imaging Studies ordered:  I ordered imaging studies including chest x-ray I independently visualized and interpreted imaging which showed emphysema with basilar atelectasis, less likely developing bibasilar bronchopneumonia. I agree with the radiologist interpretation   Cardiac Monitoring: / EKG:  The patient was maintained on a cardiac monitor.  I personally viewed and interpreted the cardiac monitored which showed an underlying rhythm of: Sinus rhythm   Problem List / ED Course / Critical interventions / Medication management  Patient presenting with worsening exertional dyspnea over the past week.  Vital signs on arrival are notable for widened pulse pressure.  Current SpO2 is normal on room air.  On exam, patient is resting comfortably.  Her current breathing is unlabored.  Although she does have history of COPD, lungs currently clear to auscultation.  Workup was initiated.   Lab work confirms anemia with hemoglobin today of 5.8.  It does appear that her baseline is in the range of 8.  Patient consented for blood transfusion.  2 units of PRBCs was ordered.  On DRE, dark stool is present.  Hemoccult testing was positive.  Patient was started on Protonix .  I spoke with gastroenterologist on-call, Dr. Mordechai April, who will plan on endoscopic tomorrow.  Patient was admitted for further management. I ordered medication including PRBCs for symptomatic anemia; Protonix  for empiric treatment of UGIB Reevaluation of the patient after these medicines showed that the patient improved I have reviewed the patients home medicines and have made adjustments as needed   Consultations Obtained:  I requested consultation with the gastroenterologist, Dr. Mordechai April,  and discussed lab and imaging findings as well as pertinent plan - they recommend: Blood transfusion, Protonix , liquid diet today, n.p.o. at midnight, GI will see tomorrow.   Social Determinants of Health:  Lives at home with family  CRITICAL CARE Performed by: Iva Mariner   Total critical care time: 34 minutes  Critical care time was exclusive of separately billable procedures and treating other patients.  Critical care was necessary to treat or prevent imminent or life-threatening deterioration.  Critical care was time spent personally by me on the following activities: development of treatment plan with patient and/or surrogate as well as nursing, discussions with consultants, evaluation of patient's response to treatment, examination of patient, obtaining history from patient or surrogate, ordering and performing treatments and interventions, ordering and review of laboratory studies, ordering and review of radiographic studies, pulse oximetry and re-evaluation of patient's condition.        Final Clinical Impression(s) / ED Diagnoses Final diagnoses:  Melena  Symptomatic anemia    Rx / DC Orders ED Discharge  Orders     None         Iva Mariner, MD 09/05/23 1558

## 2023-09-06 ENCOUNTER — Encounter (HOSPITAL_COMMUNITY)
Admission: EM | Disposition: A | Discharge status: Home / Self Care | Payer: Self-pay | Source: Home / Self Care | Attending: Internal Medicine

## 2023-09-06 ENCOUNTER — Observation Stay (HOSPITAL_COMMUNITY): Admitting: Anesthesiology

## 2023-09-06 ENCOUNTER — Encounter (HOSPITAL_COMMUNITY): Payer: Self-pay | Admitting: Internal Medicine

## 2023-09-06 DIAGNOSIS — K222 Esophageal obstruction: Secondary | ICD-10-CM | POA: Diagnosis not present

## 2023-09-06 DIAGNOSIS — I2510 Atherosclerotic heart disease of native coronary artery without angina pectoris: Secondary | ICD-10-CM | POA: Diagnosis not present

## 2023-09-06 DIAGNOSIS — K31819 Angiodysplasia of stomach and duodenum without bleeding: Secondary | ICD-10-CM

## 2023-09-06 DIAGNOSIS — K449 Diaphragmatic hernia without obstruction or gangrene: Secondary | ICD-10-CM

## 2023-09-06 DIAGNOSIS — D649 Anemia, unspecified: Secondary | ICD-10-CM | POA: Diagnosis not present

## 2023-09-06 DIAGNOSIS — Z87891 Personal history of nicotine dependence: Secondary | ICD-10-CM

## 2023-09-06 DIAGNOSIS — K921 Melena: Principal | ICD-10-CM

## 2023-09-06 DIAGNOSIS — K922 Gastrointestinal hemorrhage, unspecified: Secondary | ICD-10-CM | POA: Diagnosis not present

## 2023-09-06 HISTORY — PX: ENTEROSCOPY: SHX5533

## 2023-09-06 HISTORY — PX: ESOPHAGOGASTRODUODENOSCOPY: SHX5428

## 2023-09-06 LAB — BASIC METABOLIC PANEL WITH GFR
Anion gap: 3 — ABNORMAL LOW (ref 5–15)
BUN: 39 mg/dL — ABNORMAL HIGH (ref 8–23)
CO2: 20 mmol/L — ABNORMAL LOW (ref 22–32)
Calcium: 8.9 mg/dL (ref 8.9–10.3)
Chloride: 113 mmol/L — ABNORMAL HIGH (ref 98–111)
Creatinine, Ser: 1.18 mg/dL — ABNORMAL HIGH (ref 0.44–1.00)
GFR, Estimated: 47 mL/min — ABNORMAL LOW (ref >60)
Glucose, Bld: 92 mg/dL (ref 70–99)
Potassium: 4.5 mmol/L (ref 3.5–5.1)
Sodium: 136 mmol/L (ref 135–145)

## 2023-09-06 LAB — CBC
HCT: 27.2 % — ABNORMAL LOW (ref 36.0–46.0)
Hemoglobin: 8.3 g/dL — ABNORMAL LOW (ref 12.0–15.0)
MCH: 28.2 pg (ref 26.0–34.0)
MCHC: 30.5 g/dL (ref 30.0–36.0)
MCV: 92.5 fL (ref 80.0–100.0)
Platelets: UNDETERMINED 10*3/uL (ref 150–400)
RBC: 2.94 MIL/uL — ABNORMAL LOW (ref 3.87–5.11)
RDW: 21.6 % — ABNORMAL HIGH (ref 11.5–15.5)
WBC: 5.9 10*3/uL (ref 4.0–10.5)
nRBC: 1 % — ABNORMAL HIGH (ref 0.0–0.2)

## 2023-09-06 SURGERY — EGD (ESOPHAGOGASTRODUODENOSCOPY)
Anesthesia: General

## 2023-09-06 MED ORDER — LIDOCAINE 2% (20 MG/ML) 5 ML SYRINGE
INTRAMUSCULAR | Status: DC | PRN
Start: 2023-09-06 — End: 2023-09-06
  Administered 2023-09-06: 60 mg via INTRAVENOUS

## 2023-09-06 MED ORDER — GLUCAGON HCL RDNA (DIAGNOSTIC) 1 MG IJ SOLR
INTRAMUSCULAR | Status: DC | PRN
  Administered 2023-09-06: .5 mg via INTRAVENOUS

## 2023-09-06 MED ORDER — PROPOFOL 10 MG/ML IV BOLUS
INTRAVENOUS | Status: DC | PRN
  Administered 2023-09-06: 150 ug/kg/min via INTRAVENOUS
  Administered 2023-09-06: 50 mg via INTRAVENOUS
  Administered 2023-09-06: 100 mg via INTRAVENOUS

## 2023-09-06 MED ORDER — SODIUM CHLORIDE 0.9 % IV SOLN
INTRAVENOUS | Status: DC
Start: 2023-09-06 — End: 2023-09-06

## 2023-09-06 MED ORDER — LACTATED RINGERS IV SOLN: INTRAVENOUS | Status: DC

## 2023-09-06 MED ORDER — LIDOCAINE 5 % EX PTCH
1.0000 | MEDICATED_PATCH | CUTANEOUS | Status: DC
  Administered 2023-09-06 – 2023-09-11 (×6): 1 via TRANSDERMAL
  Filled 2023-09-06 (×7): qty 1

## 2023-09-06 MED ORDER — GLYCOPYRROLATE PF 0.2 MG/ML IJ SOSY
PREFILLED_SYRINGE | INTRAMUSCULAR | Status: DC | PRN
  Administered 2023-09-06: .2 mg via INTRAVENOUS

## 2023-09-06 MED ORDER — LACTATED RINGERS IV SOLN
INTRAVENOUS | Status: DC | PRN
Start: 2023-09-06 — End: 2023-09-06

## 2023-09-06 NOTE — Anesthesia Preprocedure Evaluation (Signed)
 Anesthesia Evaluation  Patient identified by MRN, date of birth, ID band Patient awake    Reviewed: Allergy & Precautions, H&P , NPO status , Patient's Chart, lab work & pertinent test results, reviewed documented beta blocker date and time   Airway Mallampati: II  TM Distance: >3 FB Neck ROM: full    Dental no notable dental hx.    Pulmonary neg pulmonary ROS, pneumonia, COPD, former smoker   Pulmonary exam normal breath sounds clear to auscultation       Cardiovascular Exercise Tolerance: Good hypertension, + CAD, + Past MI and + DOE  negative cardio ROS + dysrhythmias  Rhythm:regular Rate:Normal     Neuro/Psych  PSYCHIATRIC DISORDERS Anxiety Depression    negative neurological ROS  negative psych ROS   GI/Hepatic negative GI ROS, Neg liver ROS,GERD  ,,  Endo/Other  negative endocrine ROSHypothyroidism    Renal/GU Renal diseasenegative Renal ROS  negative genitourinary   Musculoskeletal   Abdominal   Peds  Hematology negative hematology ROS (+) Blood dyscrasia, anemia   Anesthesia Other Findings   Reproductive/Obstetrics negative OB ROS                             Anesthesia Physical Anesthesia Plan  ASA: 4 and emergent  Anesthesia Plan: General   Post-op Pain Management:    Induction:   PONV Risk Score and Plan: Propofol  infusion  Airway Management Planned:   Additional Equipment:   Intra-op Plan:   Post-operative Plan:   Informed Consent: I have reviewed the patients History and Physical, chart, labs and discussed the procedure including the risks, benefits and alternatives for the proposed anesthesia with the patient or authorized representative who has indicated his/her understanding and acceptance.     Dental Advisory Given  Plan Discussed with: CRNA  Anesthesia Plan Comments:        Anesthesia Quick Evaluation

## 2023-09-06 NOTE — Op Note (Signed)
 [facility] Patient Name: Patricia Jackson Procedure Date: 09/06/2023 2:24 PM MRN: [id number] Date of Birth: 12-Apr-1940 Attending MD: Samantha Cress , , [phone] CSN: [id number] Age: 83 Admit Type: Outpatient Procedure:                Upper GI endoscopy Indications:              Iron  deficiency anemia Providers:                Samantha Cress, Alisa App, [place] Wilson,                            Technician, Theola Fitch Referring MD:              Medicines:                Monitored Anesthesia Care Complications:            No immediate complications. Estimated Blood Loss:     Estimated blood loss: none. Procedure:                Pre-Anesthesia Assessment:                           - Prior to the procedure, a History and Physical                            was performed, and patient medications, allergies                            and sensitivities were reviewed. The patient's                            tolerance of previous anesthesia was reviewed.                           - The risks and benefits of the procedure and the                            sedation options and risks were discussed with the                            patient. All questions were answered and informed                            consent was obtained.                           - ASA Grade Assessment: III - A patient with severe                            systemic disease.                           After obtaining informed consent, the endoscope was                            passed under direct vision. Throughout the  procedure, the patient's blood pressure, pulse, and                            oxygen  saturations were monitored continuously. The                            GIF-H190 ([id number]) scope was introduced through the                            mouth, and advanced to the second part of duodenum.                            The upper GI endoscopy was accomplished without                             difficulty. The patient tolerated the procedure                            well. Scope In: 2:48:21 PM Scope Out: 3:14:22 PM Total Procedure Duration: 0 hours 26 minutes 1 second  Findings:      A low-grade of narrowing Schatzki ring was found at the gastroesophageal       junction. A TTS dilator was passed through the scope. Dilation with a       15-16.5-18 mm balloon dilator was performed to 18 mm. The dilation site       was examined and showed no change.      A 1 cm hiatal hernia was present.      A single small angiodysplastic lesion with no bleeding was found on the       posterior wall of the stomach. Coagulation for bleeding prevention using       argon plasma at 0.3 liters/minute and 20 watts was successful.      The examined duodenum was normal.      NOTE: pictures of enteroscopywere included in this procedure, could be       migrated to enteroscopy report. Impression:               - Low-grade of narrowing Schatzki ring. Dilated.                           - 1 cm hiatal hernia.                           - A single non-bleeding angiodysplastic lesion in                            the stomach. Treated with argon plasma coagulation                            (APC).                           - Normal examined duodenum.                           - No specimens collected. Moderate Sedation:  Per Anesthesia Care Recommendation:           - Use Protonix  (pantoprazole ) 40 mg PO BID for 4                            weeks.                           - Proceed with enteroscopy Procedure Code(s):        --- Professional ---                           [id number], Esophagogastroduodenoscopy, flexible,                            transoral; with ablation of tumor(s), polyp(s), or                            other lesion(s) (includes pre- and post-dilation                            and guide wire passage, when performed) Diagnosis Code(s):        --- Professional  ---                           K22.2, Esophageal obstruction                           K44.9, Diaphragmatic hernia without obstruction or                            gangrene                           K31.819, Angiodysplasia of stomach and duodenum                            without bleeding                           D50.9, Iron  deficiency anemia, unspecified CPT copyright 2022 American Medical Association. All rights reserved. The codes documented in this report are preliminary and upon coder review may  be revised to meet current compliance requirements. Samantha Cress, MD Samantha Cress,  09/06/2023 3:27:51 PM This report has been signed electronically. Number of Addenda: 0

## 2023-09-06 NOTE — Transfer of Care (Signed)
 Immediate Anesthesia Transfer of Care Note  Patient: Samaiya Awadallah  Procedure(s) Performed: EGD (ESOPHAGOGASTRODUODENOSCOPY) ENTEROSCOPY  Patient Location: PACU  Anesthesia Type:General  Level of Consciousness: drowsy  Airway & Oxygen  Therapy: Patient Spontanous Breathing and Patient connected to nasal cannula oxygen   Post-op Assessment: Report given to RN and Post -op Vital signs reviewed and stable  Post vital signs: Reviewed and stable  Last Vitals:  Vitals Value Taken Time  BP 89/48   Temp 97.5   Pulse 74   Resp 21   SpO2 96%     Last Pain:  Vitals:   09/06/23 1441  TempSrc:   PainSc: 0-No pain      Patients Stated Pain Goal: 5 (09/06/23 0927)  Complications: No notable events documented.

## 2023-09-06 NOTE — Care Management Obs Status (Signed)
 MEDICARE OBSERVATION STATUS NOTIFICATION   Patient Details  Name: Patricia Jackson MRN: [id number] Date of Birth: [birth year]   Medicare Observation Status Notification Given:  Yes (letter reviewed with son Patricia Jackson telephonically at [phone] due to patient being in procedure, copy mailed certified to address on file)    Westley Blass L Harvey Matlack 09/06/2023, 2:23 PM

## 2023-09-06 NOTE — TOC CM/SW Note (Signed)
 Transition of Care [facility]) - Inpatient Brief Assessment   Patient Details  Name: Patricia Jackson MRN: [id number] Date of Birth: [birth year]  Transition of Care [facility]) CM/SW Contact:    Cyndie Dredge, LCSWA Phone Number: 09/06/2023, 1:01 PM   Clinical Narrative:  Transition of Care Department [facility]) has reviewed patient and no TOC needs have been identified at this time. We will continue to monitor patient advancement through interdisciplinary progression rounds. If new patient transition needs arise, please place a TOC consult.  Transition of Care Asessment: Insurance and Status: Insurance coverage has been reviewed Patient has primary care physician: Yes Home environment has been reviewed: Single Family Home Prior level of function:: Independent Prior/Current Home Services: No current home services Social Drivers of Health Review: SDOH reviewed no interventions necessary Readmission risk has been reviewed: Yes Transition of care needs: no transition of care needs at this time

## 2023-09-06 NOTE — Progress Notes (Signed)
 We will proceed with EGD /enteroscopy as scheduled.  I thoroughly discussed with the patient the procedure, including the risks involved. Patient understands what the procedure involves including the benefits and any risks. Patient understands alternatives to the proposed procedure. Risks including (but not limited to) bleeding, tearing of the lining (perforation), rupture of adjacent organs, problems with heart and lung function, infection, and medication reactions. A small percentage of complications may require surgery, hospitalization, repeat endoscopic procedure, and/or transfusion.  Patient understood and agreed.  Samantha Cress, MD Gastroenterology and Hepatology [facility]

## 2023-09-06 NOTE — Op Note (Signed)
 [facility] Patient Name: Patricia Jackson Procedure Date: 09/06/2023 3:28 PM MRN: [id number] Date of Birth: 1940-10-25 Attending MD: Samantha Cress , , [id number] CSN: [id number] Age: 83 Admit Type: Outpatient Procedure:                Small bowel enteroscopy Indications:              Iron  deficiency anemia Providers:                Samantha Cress, Alisa App, [place] Wilson,                            Technician, Theola Fitch Referring MD:              Medicines:                Monitored Anesthesia Care Complications:            No immediate complications. Estimated Blood Loss:     Estimated blood loss: none. Procedure:                Pre-Anesthesia Assessment:                           - Prior to the procedure, a History and Physical                            was performed, and patient medications, allergies                            and sensitivities were reviewed. The patient's                            tolerance of previous anesthesia was reviewed.                           - The risks and benefits of the procedure and the                            sedation options and risks were discussed with the                            patient. All questions were answered and informed                            consent was obtained.                           - ASA Grade Assessment: III - A patient with severe                            systemic disease.                           After obtaining informed consent, the endoscope was                            passed under direct vision. Throughout the  procedure, the patient's blood pressure, pulse, and                            oxygen  saturations were monitored continuously. The                            [phone]) scope was introduced through                            the mouth and advanced to the proximal jejunum. The                            small bowel enteroscopy was accomplished  without                            difficulty. The patient tolerated the procedure                            well. Findings:      A 1 cm hiatal hernia was present.      The stomach was normal.      There was no evidence of significant pathology in the entire examined       duodenum.      Localized hemorrhagic mucosa with active bleeding was found in the       proximal jejunum. There was a large amount of fresh blood which was       thoroughly lavaged. 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 For hemostasis, one hemostatic       clip was successfully placed (MR safe). Clip manufacturer: [employer]. There was no bleeding at the end of the procedure. Impression:               - 1 cm hiatal hernia.                           - Normal stomach.                           - Normal examined duodenum.                           - Hemorrhagic jejunum.                           - No specimens collected. Moderate Sedation:      Per Anesthesia Care Recommendation:           - Return patient to hospital ward for ongoing care.                           - Clear liquid diet today. Advance as tolerated                            tomorrow.                           - Restart  anticoagulation tomorrow.                           - Use Protonix  (pantoprazole ) 40 mg PO BID for 4                            weeks. Procedure Code(s):        --- Professional ---                           [phone], Small intestinal endoscopy, enteroscopy                            beyond second portion of duodenum, not including                            ileum; with control of bleeding (eg, injection,                            bipolar cautery, unipolar cautery, laser, heater                            probe, stapler, plasma coagulator) Diagnosis Code(s):        --- Professional ---                           K44.9, Diaphragmatic hernia without obstruction or                             gangrene                           K92.2, Gastrointestinal hemorrhage, unspecified                           D50.9, Iron  deficiency anemia, unspecified CPT copyright 2022 American Medical Association. All rights reserved. The codes documented in this report are preliminary and upon coder review may  be revised to meet current compliance requirements. Samantha Cress, MD Samantha Cress,  09/06/2023 3:31:55 PM This report has been signed electronically. Number of Addenda: 0

## 2023-09-06 NOTE — Plan of Care (Signed)

## 2023-09-06 NOTE — Progress Notes (Signed)
 Patient became SOB while going to restroom with RN.  RN turned patient's flow up to 8L.  Patient had hard time recovering.  Placed patient on [place] HFNC on 5L, sat came up to 94%.

## 2023-09-06 NOTE — Brief Op Note (Signed)
 09/06/2023  3:27 PM  PATIENT:  Patricia Jackson  83 y.o. female  PRE-OPERATIVE DIAGNOSIS:  acute on chronic anemia, heme positive  POST-OPERATIVE DIAGNOSIS:  schatzki's ring, gastric AVM, 1cm hiatal hernia, 15-18 balloon dilation, small bowel dieulafoy lesion  PROCEDURE:  Procedure(s) with comments: EGD (ESOPHAGOGASTRODUODENOSCOPY) (N/A) - with possible dilation ENTEROSCOPY (N/A)  SURGEON:  Surgeons and Role:    * Urban Garden, MD - Primary  Patient underwent EGD/enteroscopy under propofol  sedation.  Tolerated the procedure adequately.   FINDINGS: - Low-grade of narrowing Schatzki ring.  Dilated.  - 1 cm hiatal hernia.  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  Treated with argon plasma coagulation (APC).  - Normal examined duodenum.   RECOMMENDATIONS - Return patient to hospital ward for ongoing care.  - Clear liquid diet today.  Advance as tolerated tomorrow. - Restart anticoagulation tomorrow. - Use Protonix  (pantoprazole ) 40 mg PO BID for 4 weeks.   Samantha Cress, MD Gastroenterology and Hepatology [facility]

## 2023-09-06 NOTE — Progress Notes (Signed)
 Progress Note   Patient: Patricia Jackson WUJ:[id number] DOB: 04/09/1940 DOA: 09/05/2023     0 DOS: the patient was seen and examined on 09/06/2023   Brief hospital admission narrative: Myriam Brandhorst is a 83 y.o. female with medical history significant of history of atrial fibrillation on chronic anticoagulation, COPD, hypothyroidism, hypertension, hyperlipidemia, chronic kidney disease, dysphagia and anemia of chronic kidney disease and chronic GI bleed; who presented to the hospital secondary to shortness of breath with activity and increased fatigue.  No chest pain, no fever, there is no reports of hematemesis/hematochezia or melena.  No sick contacts, no hematuria, dysuria, focal weaknesses or any other complaints.   Workup in the ED demonstrating positive fecal occult blood test; hemoglobin 5.8.  Of note patient actively receiving iron  infusions as an outpatient.   2 units PRBCs have been ordered; GI service contacted and TRH consulted to place patient in the hospital for further evaluation and management.  Assessment and plan 1-acute on chronic anemia - In the setting of GI bleed; - Status post 2 units PRBCs - Hemoglobin around 8.3 currently - Hemodynamically stable. - Continue IV PPI - Patient n.p.o. pending endoscopic evaluation - Continue holding Eliquis . - Will follow GI service recommendation and clearance to resume anticoagulation.  2-dysphagia - Follow GI service recommendation after endoscopic evaluation. - Currently n.p.o.; she tolerated clear liquid diet on 09/05/2023.  3-paroxysmal atrial fibrillation - Continue holding Eliquis  secondary to problem #1 - Continue the use of amiodarone  for rate control - Patient currently in sinus rhythm.  4-essential hypertension - Continue amlodipine  - Follow vital signs.  5-hypothyroidism - Continue Synthroid .  6-chronic kidney disease stage IV - Currently at baseline - Continue to follow renal function trend. - Patient  advised to maintain adequate hydration.  7-history of COPD - No exacerbation currently appreciated - Good saturation on room air - Continue bronchodilator management.  8-depression/anxiety - Continue treatment with Wellbutrin  and sertraline  - No suicidal ideation or hallucination.  Subjective:  Afebrile, no chest pain, no nausea vomiting reported.  No overt bleeding appreciated overnight.  Patient is present to be hungry.  Physical Exam: Vitals:   09/06/23 0756 09/06/23 0800 09/06/23 0811 09/06/23 0927  BP:  (!) 177/67  (!) 165/68  Pulse:  68    Resp:  19    Temp: 98 F (36.7 C)     TempSrc: Oral     SpO2:  97% 99%   Weight:      Height:       General exam: Alert, awake, oriented x 3; in no acute distress. Respiratory system: Clear to auscultation. Respiratory effort normal.  No using accessory muscle. Cardiovascular system:RRR. No rubs or gallops. Gastrointestinal system: Abdomen is nondistended, soft and nontender. No organomegaly or masses felt. Normal bowel sounds heard. Central nervous system: No focal neurological deficits. Extremities: No cyanosis or clubbing. Skin: No petechiae. Psychiatry: Judgement and insight appear normal. Mood & affect appropriate.    Data Reviewed: CBC: WBCs 5.9, hemoglobin 8.3, platelet count: clumped on smear unable to estimate Basic metabolic panel: Sodium 136, potassium 4.5, chloride 113, bicarb 20, BUN 39, creatinine 1.18, GFR 47 and anion gap 3.  Family Communication: No family at bedside.  Disposition: Status is: Observation The patient remains OBS appropriate and will d/c before 2 midnights.  Anticipate discharge back home once medically stable.  Patient safe to be transfer to 300 unit.  Time spent: 35 minutes  Author: Justina Oman, MD 09/06/2023 1:33 PM  For on call review [email or web address].

## 2023-09-06 NOTE — Progress Notes (Signed)
 Gave nurse to nurse report to [facility] on 300. Patient to transfer from Procedure (EGD) to room 304 once completed.

## 2023-09-07 ENCOUNTER — Encounter (HOSPITAL_COMMUNITY): Payer: Self-pay | Admitting: Gastroenterology

## 2023-09-07 ENCOUNTER — Other Ambulatory Visit: Payer: Self-pay

## 2023-09-07 ENCOUNTER — Observation Stay (HOSPITAL_COMMUNITY)

## 2023-09-07 ENCOUNTER — Encounter

## 2023-09-07 DIAGNOSIS — K5900 Constipation, unspecified: Secondary | ICD-10-CM

## 2023-09-07 DIAGNOSIS — K219 Gastro-esophageal reflux disease without esophagitis: Secondary | ICD-10-CM

## 2023-09-07 DIAGNOSIS — I4892 Unspecified atrial flutter: Secondary | ICD-10-CM

## 2023-09-07 DIAGNOSIS — K31819 Angiodysplasia of stomach and duodenum without bleeding: Secondary | ICD-10-CM | POA: Diagnosis not present

## 2023-09-07 DIAGNOSIS — F418 Other specified anxiety disorders: Secondary | ICD-10-CM

## 2023-09-07 DIAGNOSIS — D649 Anemia, unspecified: Secondary | ICD-10-CM | POA: Diagnosis not present

## 2023-09-07 DIAGNOSIS — K222 Esophageal obstruction: Secondary | ICD-10-CM | POA: Diagnosis not present

## 2023-09-07 DIAGNOSIS — N1832 Chronic kidney disease, stage 3b: Secondary | ICD-10-CM

## 2023-09-07 DIAGNOSIS — R918 Other nonspecific abnormal finding of lung field: Secondary | ICD-10-CM | POA: Diagnosis not present

## 2023-09-07 DIAGNOSIS — R0602 Shortness of breath: Secondary | ICD-10-CM | POA: Diagnosis not present

## 2023-09-07 LAB — CBC
HCT: 25.7 % — ABNORMAL LOW (ref 36.0–46.0)
Hemoglobin: 8 g/dL — ABNORMAL LOW (ref 12.0–15.0)
MCH: 28.8 pg (ref 26.0–34.0)
MCHC: 31.1 g/dL (ref 30.0–36.0)
MCV: 92.4 fL (ref 80.0–100.0)
Platelets: DECREASED 10*3/uL (ref 150–400)
RBC: 2.78 MIL/uL — ABNORMAL LOW (ref 3.87–5.11)
RDW: 21.8 % — ABNORMAL HIGH (ref 11.5–15.5)
WBC: 12.5 10*3/uL — ABNORMAL HIGH (ref 4.0–10.5)
nRBC: 0.2 % (ref 0.0–0.2)

## 2023-09-07 LAB — PREPARE RBC (CROSSMATCH)

## 2023-09-07 MED ORDER — LIDOCAINE 5 % EX PTCH: 1.0000 | MEDICATED_PATCH | CUTANEOUS | 0 refills | Status: DC

## 2023-09-07 MED ORDER — APIXABAN 2.5 MG PO TABS: 2.5000 mg | ORAL_TABLET | Freq: Two times a day (BID) | ORAL | Status: DC

## 2023-09-07 MED ORDER — SODIUM CHLORIDE 0.9% IV SOLUTION: Freq: Once | INTRAVENOUS | Status: AC

## 2023-09-07 MED ORDER — FUROSEMIDE 10 MG/ML IJ SOLN
20.0000 mg | Freq: Once | INTRAMUSCULAR | Status: AC
  Administered 2023-09-07: 20 mg via INTRAVENOUS
  Filled 2023-09-07: qty 2

## 2023-09-07 MED ORDER — PANTOPRAZOLE SODIUM 40 MG PO TBEC: 40.0000 mg | DELAYED_RELEASE_TABLET | Freq: Two times a day (BID) | ORAL | 2 refills | Status: DC

## 2023-09-07 NOTE — Progress Notes (Signed)
 Pt exp SOB while being assisted using restroom. Pt had difficulties recovering, increased oxy flow to 8L. RR at bedside, placed pt on Salter HFNC. Pt osat currently sustaining at 94%. Will continue to wean pt off [place] osat, as she can tolerate.

## 2023-09-07 NOTE — Discharge Summary (Signed)
 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 Gastroesophageal reflux disease Essential hypertension History of melena History of COPD   Brief hospital admission narrative: Patricia Jackson is a 83 y.o. female with medical history significant of history of atrial fibrillation on chronic anticoagulation, COPD, hypothyroidism, hypertension, hyperlipidemia, chronic kidney disease, dysphagia and anemia of chronic kidney disease and chronic GI bleed; who presented to the hospital secondary to shortness of breath with activity and increased fatigue.  No chest pain, no fever, there is no reports of hematemesis/hematochezia or melena.  No sick contacts, no hematuria, dysuria, focal weaknesses or any other complaints.   Workup in the ED demonstrating positive fecal occult blood test; hemoglobin 5.8.  Of note patient actively receiving iron  infusions as an outpatient.   2 units PRBCs have been ordered; GI service contacted and TRH consulted to place patient in the hospital for further evaluation and management.  Assessment and Plan: 1-acute on chronic anemia - In the setting of GI bleed; - Status post 3 units PRBCs - Hemodynamically stable and ready to go home. - Continue PPI twice a day and following recommendations by GI service safe to resume anticoagulation on 09/08/2023 - Patient is status post endoscopy with dilatation  of Schatzki ring and also APC therapy for gastric angiodysplastic lesion. - Patient has been advised to avoid the use of NSAIDs.   2-dysphagia - Most likely associated with Schatzki ring narrowing; status post dilatation during endoscopy procedure 09/06/2023 - Continue PPI.   3-paroxysmal atrial fibrillation - Continue amiodarone  rate control - Resuming the use of Eliquis  on 09/08/2023 - Patient currently in sinus rhythm.   4-essential hypertension - Continue amlodipine  - Follow vital signs.   5-hypothyroidism - Continue Synthroid .   6-chronic kidney disease stage IV - Currently at baseline - Continue to follow renal function trend intermittently. - Patient advised to maintain adequate hydration. -Continue outpatient follow-up with nephrology service.   7-history of COPD - No exacerbation currently appreciated - Good saturation on room air; no using accessory muscles. - Continue bronchodilator management.   8-depression/anxiety - Continue treatment with Wellbutrin , Remeron and trazodone.  9-hyperlipidemia - Continue statin.  Consultants: GI service Procedures performed: See below for x-ray report; endoscopy with APC and Schatzki ring narrowing dilatation (09/06/2023). Disposition: Home Diet recommendation: Heart healthy/low-sodium diet.  DISCHARGE MEDICATION: Allergies as of 09/07/2023       Reactions   Povidone-iodine Other (See Comments)   Burning   Sulfa Antibiotics Other (See Comments)   Unknown    Sulfamethoxazole Other (See Comments)   Unknown         Medication List     TAKE these medications    acetaminophen  500 MG tablet Commonly known as: TYLENOL  Take 1,000 mg by mouth every 8 (eight) hours as needed for mild pain or moderate pain.   albuterol  (2.5 MG/3ML) 0.083% nebulizer solution Commonly known as: PROVENTIL  Take 2.5 mg by nebulization every 6 (six) hours as needed for shortness of breath or wheezing.   albuterol  108 (90  Base) MCG/ACT  inhaler Commonly known as: VENTOLIN  HFA Inhale 1-2 puffs into the lungs every 6 (six) hours as needed for wheezing or shortness of breath.   amiodarone  200 MG tablet Commonly known as: PACERONE  Take 1 Tablet ( 200 mg ) Daily and none on Sunday What changed:  how much to take how to take this when to take this   amLODipine  5 MG tablet Commonly known as: NORVASC  Take 5 mg by mouth daily.   apixaban  2.5 MG Tabs tablet Commonly known as: ELIQUIS  Take 1 tablet (2.5 mg total) by mouth 2 (two) times daily. Start taking on: September 08, 2023   buPROPion  150 MG 24 hr tablet Commonly known as: WELLBUTRIN  XL Take 150 mg by mouth daily.   levothyroxine  50 MCG tablet Commonly known as: SYNTHROID  Take 50 mcg by mouth daily before breakfast.   lidocaine  5 % Commonly known as: LIDODERM  Place 1 patch onto the skin daily. Remove & Discard patch within 12 hours or as directed by MD   linaclotide  290 MCG Caps capsule Commonly known as: LINZESS  Take 1 capsule (290 mcg total) by mouth daily before breakfast.   mirtazapine 15 MG tablet Commonly known as: REMERON Take 7.5 mg by mouth at bedtime.   nitroGLYCERIN  0.4 MG SL tablet Commonly known as: NITROSTAT  Place 0.4 mg under the tongue every 5 (five) minutes as needed for chest pain.   pantoprazole  40 MG tablet Commonly known as: Protonix  Take 1 tablet (40 mg total) by mouth 2 (two) times daily. What changed: when to take this   rosuvastatin  20 MG tablet Commonly known as: CRESTOR  Take 20 mg by mouth daily.   traZODone 50 MG tablet Commonly known as: DESYREL Take 50 mg by mouth at bedtime.   Trelegy Ellipta 200-62.5-25 MCG/ACT Aepb Generic drug: Fluticasone -Umeclidin-Vilant Inhale 1 puff into the lungs daily.   Vitamin D3 50 MCG (2000 UT) Tabs Take 2,000 Units by mouth daily.        Follow-up Information     Omie Bickers, MD. Schedule an appointment as soon as possible for a visit in 10 day(s).   Specialty: Internal  Medicine Contact information: [address] [place] [address] [phone]                Discharge Exam: Filed Weights   09/05/23 0952  Weight: 59.4 kg    General exam: Alert, awake, oriented x 3; in no acute distress.  No overt bleeding reported. Respiratory system: Clear to auscultation. Respiratory effort normal.  No using accessory muscle.  Good saturation on room air. Cardiovascular system:RRR. No rubs or gallops. Gastrointestinal system: Abdomen is nondistended, soft and nontender. No organomegaly or masses felt. Normal bowel sounds heard. Central nervous system: No focal neurological deficits. Extremities: No cyanosis or clubbing. Skin: No petechiae. Psychiatry: Judgement and insight appear normal. Mood & affect appropriate.   Condition at discharge: Stable and improved.  The results of significant diagnostics from this hospitalization (including imaging, microbiology, ancillary and laboratory) are listed below for reference.   Imaging Studies: DG Chest 2 View Result Date: 09/05/2023 CLINICAL DATA:  SOB EXAM: CHEST - 2 VIEW COMPARISON:  June 15, 2023 FINDINGS: Chronic coarsening of the pulmonary interstitium with flattening of the diaphragms, likely due to underlying emphysema. Patchy airspace opacities in the right mid lung zone and both lung bases. No pleural effusion or pneumothorax. No cardiomegaly. Tortuous aorta with aortic atherosclerosis. No acute fracture or destructive lesions. Multilevel thoracic osteophytosis. Surgical clips in the  left axilla. IMPRESSION: Emphysema. Patchy airspace opacities in the right mid and both lower lung zones, which may represent atelectasis or a developing bronchopneumonia, in the correct clinical context. Electronically Signed   By: Rance Burrows M.D.   On: 09/05/2023 12:07    Microbiology: Results for orders placed or performed during the hospital encounter of 09/05/23  Resp panel by RT-PCR (RSV, Flu A&B, Covid)  Anterior Nasal Swab     Status: None   Collection Time: 09/05/23 10:55 AM   Specimen: Anterior Nasal Swab  Result Value Ref Range Status   SARS Coronavirus 2 by RT PCR NEGATIVE NEGATIVE Final    Comment: (NOTE) SARS-CoV-2 target nucleic acids are NOT DETECTED.  The SARS-CoV-2 RNA is generally detectable in upper respiratory specimens during the acute phase of infection. The lowest concentration of SARS-CoV-2 viral copies this assay can detect is 138 copies/mL. A negative result does not preclude SARS-Cov-2 infection and should not be used as the sole basis for treatment or other patient management decisions. A negative result may occur with  improper specimen collection/handling, submission of specimen other than nasopharyngeal swab, presence of viral mutation(s) within the areas targeted by this assay, and inadequate number of viral copies(<138 copies/mL). A negative result must be combined with clinical observations, patient history, and epidemiological information. The expected result is Negative.  Fact Sheet for Patients:  BloggerCourse.com  Fact Sheet for Healthcare Providers:  SeriousBroker.it  This test is no t yet approved or cleared by the [place]  FDA and  has been authorized for detection and/or diagnosis of SARS-CoV-2 by FDA under an Emergency Use Authorization (EUA). This EUA will remain  in effect (meaning this test can be used) for the duration of the COVID-19 declaration under Section 564(b)(1) of the Act, 21 U.S.C.section 360bbb-3(b)(1), unless the authorization is terminated  or revoked sooner.       Influenza A by PCR NEGATIVE NEGATIVE Final   Influenza B by PCR NEGATIVE NEGATIVE Final    Comment: (NOTE) The Xpert Xpress SARS-CoV-2/FLU/RSV plus assay is intended as an aid in the diagnosis of influenza from Nasopharyngeal swab specimens and should not be used as a sole basis for treatment. Nasal washings  and aspirates are unacceptable for Xpert Xpress SARS-CoV-2/FLU/RSV testing.  Fact Sheet for Patients: BloggerCourse.com  Fact Sheet for Healthcare Providers: SeriousBroker.it  This test is not yet approved or cleared by the [place]  FDA and has been authorized for detection and/or diagnosis of SARS-CoV-2 by FDA under an Emergency Use Authorization (EUA). This EUA will remain in effect (meaning this test can be used) for the duration of the COVID-19 declaration under Section 564(b)(1) of the Act, 21 U.S.C. section 360bbb-3(b)(1), unless the authorization is terminated or revoked.     Resp Syncytial Virus by PCR NEGATIVE NEGATIVE Final    Comment: (NOTE) Fact Sheet for Patients: BloggerCourse.com  Fact Sheet for Healthcare Providers: SeriousBroker.it  This test is not yet approved or cleared by the [place]  FDA and has been authorized for detection and/or diagnosis of SARS-CoV-2 by FDA under an Emergency Use Authorization (EUA). This EUA will remain in effect (meaning this test can be used) for the duration of the COVID-19 declaration under Section 564(b)(1) of the Act, 21 U.S.C. section 360bbb-3(b)(1), unless the authorization is terminated or revoked.  Performed at [facility], [address], [place], [place] [address]   MRSA Next Gen by PCR, Nasal     Status: None   Collection Time: 09/05/23  2:09 PM  Specimen: Nasal Mucosa; Nasal Swab  Result Value Ref Range Status   MRSA by PCR Next Gen NOT DETECTED NOT DETECTED Final    Comment: (NOTE) The GeneXpert MRSA Assay (FDA approved for NASAL specimens only), is one component of a comprehensive MRSA colonization surveillance program. It is not intended to diagnose MRSA infection nor to guide or monitor treatment for MRSA infections. Test performance is not FDA approved in patients less than 59 years old. Performed  at [facility], [address], [place], [place] [address]     Labs: CBC: Recent Labs  Lab 09/05/23 1012 09/05/23 2001 09/06/23 0450 09/07/23 0429  WBC 8.7  --  5.9 12.5*  HGB 5.8* 8.5* 8.3* 8.0*  HCT 21.2* 26.7* 27.2* 25.7*  MCV 96.8  --  92.5 92.4  PLT PLATELET CLUMPS NOTED ON SMEAR, COUNT APPEARS DECREASED  --  PLATELET CLUMPS NOTED ON SMEAR, UNABLE TO ESTIMATE PLATELET CLUMPS NOTED ON SMEAR, COUNT APPEARS DECREASED   Basic Metabolic Panel: Recent Labs  Lab 09/05/23 1012 09/05/23 1046 09/06/23 0450  NA 140  --  136  K 4.4  --  4.5  CL 114*  --  113*  CO2 17*  --  20*  GLUCOSE 99  --  92  BUN 34*  --  39*  CREATININE 1.35*  --  1.18*  CALCIUM  8.7*  --  8.9  MG  --  1.9  --    Liver Function Tests: Recent Labs  Lab 09/05/23 1046  AST 38  ALT 20  ALKPHOS 41  BILITOT 0.5  PROT 5.7*  ALBUMIN  2.9*   CBG: No results for input(s): "GLUCAP" in the last 168 hours.  Discharge time spent: greater than 30 minutes.  Signed: Justina Oman, MD [facility] 09/07/2023

## 2023-09-07 NOTE — Plan of Care (Signed)
  Problem: Clinical Measurements: Goal: Respiratory complications will improve Outcome: Progressing   Problem: Activity: Goal: Risk for activity intolerance will decrease Outcome: Progressing   Problem: Nutrition: Goal: Adequate nutrition will be maintained Outcome: Progressing   Problem: Pain Managment: Goal: General experience of comfort will improve and/or be controlled Outcome: Progressing   Problem: Safety: Goal: Ability to remain free from injury will improve Outcome: Progressing   Problem: Skin Integrity: Goal: Risk for impaired skin integrity will decrease Outcome: Progressing

## 2023-09-07 NOTE — Progress Notes (Signed)
 Second attempt at ambulating in hallway per Dr. Charlean Congress request:   O2 Testing: SpO2 95% RA at rest SpO2 94% RA ambulation

## 2023-09-07 NOTE — Plan of Care (Signed)

## 2023-09-07 NOTE — Progress Notes (Addendum)
 Subjective: Denies abdominal pain, some nausea yesterday but no vomiting. No BMs for the last few days, thinks maybe last was Saturday and black in color, history of constipation, takes Linzess  as outpatient. SOB feeling better, mild dizziness yesterday but feeling better today.   Objective: Vital signs in last 24 hours: Temp:  [97.5 F (36.4 C)-100.5 F (38.1 C)] 99.1 F (37.3 C) (06/04 0330) Pulse Rate:  [63-81] 81 (06/04 0330) Resp:  [15-46] 22 (06/04 0330) BP: (84-165)/(46-117) 136/117 (06/04 0330) SpO2:  [91 %-100 %] 96 % (06/04 0330) Last BM Date : 09/04/23 General:   Alert and oriented, pleasant Head:  Normocephalic and atraumatic. Eyes:  No icterus, sclera clear. Conjuctiva pink.  Mouth:  Without lesions, mucosa pink and moist.  Heart:  S1, S2 present, no murmurs noted.  Lungs: Clear to auscultation bilaterally, without wheezing, rales, or rhonchi.  Abdomen:  Bowel sounds present, soft, non-tender, non-distended. No HSM or hernias noted. No rebound or guarding. No masses appreciated  Neurologic:  Alert and  oriented x4;  grossly normal neurologically. Skin:  Warm and dry, intact without significant lesions.  Psych:  Alert and cooperative. Normal mood and affect.  Intake/Output from previous day: 06/03 0701 - 06/04 0700 In: 1620 [P.O.:720; I.V.:900] Out: -  Intake/Output this shift: No intake/output data recorded.  Lab Results: Recent Labs    09/05/23 1012 09/05/23 2001 09/06/23 0450 09/07/23 0429  WBC 8.7  --  5.9 12.5*  HGB 5.8* 8.5* 8.3* 8.0*  HCT 21.2* 26.7* 27.2* 25.7*  PLT PLATELET CLUMPS NOTED ON SMEAR, COUNT APPEARS DECREASED  --  PLATELET CLUMPS NOTED ON SMEAR, UNABLE TO ESTIMATE PLATELET CLUMPS NOTED ON SMEAR, COUNT APPEARS DECREASED   BMET Recent Labs    09/05/23 1012 09/06/23 0450  NA 140 136  K 4.4 4.5  CL 114* 113*  CO2 17* 20*  GLUCOSE 99 92  BUN 34* 39*  CREATININE 1.35* 1.18*  CALCIUM  8.7* 8.9   LFT Recent Labs    09/05/23 1046   PROT 5.7*  ALBUMIN  2.9*  AST 38  ALT 20  ALKPHOS 41  BILITOT 0.5  BILIDIR <0.1  IBILI NOT CALCULATED    Assessment: Patricia Jackson is an 83 year old female with a history of atrial flutter/A-fib on Eliquis , IDA, CAD, COPD, HTN, breast cancer, CKD NSTEMI with DES in 2017, recurrent significant anemia requiring blood transfusions in Aug 2024 and March 2025, who presented this admission with recurrent significant symptomatic anemia with Hgb 5.8, down from 8.8 at last hospitalization. GI consulted due to anemia and heme positive stool.   Acute on chronic anemia: on eliquis . 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  -Hgb 8 this morning, down from 8.5 on 6/2. S/p 2 units PRBCs. Suspect some aspect of equilibration given active bleeding found yesterday on endoscopy  -1 Unit PRBCs ordered by hospitalist this morning  -Denies rectal bleeding, report dark stools as outpatient, last Seen Saturday  Dysphagia: Schatzki ring on EGD yesterday, as above. Dilated. Swallowing feeling better today.   Constipation: thinks last BM was Saturday a this was  the last time he took her linzess  which usually provide good results for her. Linzess  was restarted yesterday. Consider addition of miralax  if not moving bowels soon. She is passing flatus. Denies abdominal pain.    Plan: PPI BID Trend h&h, transfuse as needed  Monitor for overt GI bleeding May benefit from sandostatin as outpatient given ongiong bleeding from AVMs Colonoscopy in 3  months  Continue linzess  290mcg daily Consider addition of miralax  if no BMs on linzess  alone    LOS: 0 days    09/07/2023, 8:45 AM  GI will sign off    Adyn Hoes L. Ameyah Bangura, MSN, APRN, AGNP-C Adult-Gerontology Nurse Practitioner [facility] at [facility]

## 2023-09-07 NOTE — Progress Notes (Signed)
 Mobility Specialist Progress Note:    09/07/23 1622  Mobility  Activity Ambulated with assistance in hallway;Ambulated with assistance to bathroom  Level of Assistance Minimal assist, patient does 75% or more  Assistive Device Front wheel walker  Distance Ambulated (ft) 25 ft  Range of Motion/Exercises Active;All extremities  Activity Response Tolerated well  Mobility Referral Yes  Mobility visit 1 Mobility  Mobility Specialist Start Time (ACUTE ONLY) 1552  Mobility Specialist Stop Time (ACUTE ONLY) 1614  Mobility Specialist Time Calculation (min) (ACUTE ONLY) 22 min   Pt received in bed, agreeable to mobility. Required MinA to stand and transfer with RW. Tolerated well, see O2 sats below. Returned supine, all needs met.   SpO2 90% on RA at rest SpO2 86% on ra during ambulation SpO2 91% on 1L during ambulation  Glinda Lapping Mobility Specialist Please contact via SecureChat or  Rehab office at [phone]

## 2023-09-07 NOTE — Plan of Care (Signed)
  Problem: Education: Goal: Knowledge of General Education information will improve Description: Including pain rating scale, medication(s)/side effects and non-pharmacologic comfort measures 09/07/2023 1658 by Goldie Later, RN Outcome: Adequate for Discharge 09/07/2023 1013 by Goldie Later, RN Outcome: Progressing   Problem: Health Behavior/Discharge Planning: Goal: Ability to manage health-related needs will improve 09/07/2023 1658 by Goldie Later, RN Outcome: Adequate for Discharge 09/07/2023 1013 by Goldie Later, RN Outcome: Progressing   Problem: Clinical Measurements: Goal: Ability to maintain clinical measurements within normal limits will improve 09/07/2023 1658 by Goldie Later, RN Outcome: Adequate for Discharge 09/07/2023 1013 by Goldie Later, RN Outcome: Progressing Goal: Will remain free from infection 09/07/2023 1658 by Goldie Later, RN Outcome: Adequate for Discharge 09/07/2023 1013 by Goldie Later, RN Outcome: Progressing Goal: Diagnostic test results will improve 09/07/2023 1658 by Goldie Later, RN Outcome: Adequate for Discharge 09/07/2023 1013 by Goldie Later, RN Outcome: Progressing Goal: Respiratory complications will improve 09/07/2023 1658 by Goldie Later, RN Outcome: Adequate for Discharge 09/07/2023 1013 by Goldie Later, RN Outcome: Progressing Goal: Cardiovascular complication will be avoided 09/07/2023 1658 by Goldie Later, RN Outcome: Adequate for Discharge 09/07/2023 1013 by Goldie Later, RN Outcome: Progressing   Problem: Activity: Goal: Risk for activity intolerance will decrease 09/07/2023 1658 by Goldie Later, RN Outcome: Adequate for Discharge 09/07/2023 1013 by Goldie Later, RN Outcome: Progressing   Problem: Nutrition: Goal: Adequate nutrition will be maintained 09/07/2023 1658 by Goldie Later, RN Outcome: Adequate for Discharge 09/07/2023 1013 by Goldie Later, RN Outcome: Progressing   Problem: Coping: Goal: Level of anxiety will  decrease 09/07/2023 1658 by Goldie Later, RN Outcome: Adequate for Discharge 09/07/2023 1013 by Goldie Later, RN Outcome: Progressing   Problem: Elimination: Goal: Will not experience complications related to bowel motility 09/07/2023 1658 by Goldie Later, RN Outcome: Adequate for Discharge 09/07/2023 1013 by Goldie Later, RN Outcome: Progressing Goal: Will not experience complications related to urinary retention 09/07/2023 1658 by Goldie Later, RN Outcome: Adequate for Discharge 09/07/2023 1013 by Goldie Later, RN Outcome: Progressing   Problem: Pain Managment: Goal: General experience of comfort will improve and/or be controlled 09/07/2023 1658 by Goldie Later, RN Outcome: Adequate for Discharge 09/07/2023 1013 by Goldie Later, RN Outcome: Progressing   Problem: Safety: Goal: Ability to remain free from injury will improve 09/07/2023 1658 by Goldie Later, RN Outcome: Adequate for Discharge 09/07/2023 1013 by Goldie Later, RN Outcome: Progressing   Problem: Skin Integrity: Goal: Risk for impaired skin integrity will decrease 09/07/2023 1658 by Goldie Later, RN Outcome: Adequate for Discharge 09/07/2023 1013 by Goldie Later, RN Outcome: Progressing

## 2023-09-08 ENCOUNTER — Observation Stay (HOSPITAL_COMMUNITY)

## 2023-09-08 DIAGNOSIS — I700 Atherosclerosis of aorta: Secondary | ICD-10-CM | POA: Diagnosis not present

## 2023-09-08 DIAGNOSIS — J189 Pneumonia, unspecified organism: Secondary | ICD-10-CM | POA: Diagnosis present

## 2023-09-08 DIAGNOSIS — F32A Depression, unspecified: Secondary | ICD-10-CM | POA: Diagnosis present

## 2023-09-08 DIAGNOSIS — Z7901 Long term (current) use of anticoagulants: Secondary | ICD-10-CM | POA: Diagnosis not present

## 2023-09-08 DIAGNOSIS — I2510 Atherosclerotic heart disease of native coronary artery without angina pectoris: Secondary | ICD-10-CM | POA: Diagnosis present

## 2023-09-08 DIAGNOSIS — E785 Hyperlipidemia, unspecified: Secondary | ICD-10-CM | POA: Diagnosis present

## 2023-09-08 DIAGNOSIS — J440 Chronic obstructive pulmonary disease with acute lower respiratory infection: Secondary | ICD-10-CM | POA: Diagnosis present

## 2023-09-08 DIAGNOSIS — K219 Gastro-esophageal reflux disease without esophagitis: Secondary | ICD-10-CM | POA: Diagnosis present

## 2023-09-08 DIAGNOSIS — D62 Acute posthemorrhagic anemia: Secondary | ICD-10-CM | POA: Diagnosis present

## 2023-09-08 DIAGNOSIS — R918 Other nonspecific abnormal finding of lung field: Secondary | ICD-10-CM | POA: Diagnosis not present

## 2023-09-08 DIAGNOSIS — J42 Unspecified chronic bronchitis: Secondary | ICD-10-CM | POA: Diagnosis not present

## 2023-09-08 DIAGNOSIS — F419 Anxiety disorder, unspecified: Secondary | ICD-10-CM | POA: Diagnosis present

## 2023-09-08 DIAGNOSIS — E039 Hypothyroidism, unspecified: Secondary | ICD-10-CM | POA: Diagnosis present

## 2023-09-08 DIAGNOSIS — I129 Hypertensive chronic kidney disease with stage 1 through stage 4 chronic kidney disease, or unspecified chronic kidney disease: Secondary | ICD-10-CM | POA: Diagnosis present

## 2023-09-08 DIAGNOSIS — J9601 Acute respiratory failure with hypoxia: Secondary | ICD-10-CM | POA: Diagnosis present

## 2023-09-08 DIAGNOSIS — J8410 Pulmonary fibrosis, unspecified: Secondary | ICD-10-CM | POA: Diagnosis present

## 2023-09-08 DIAGNOSIS — R0602 Shortness of breath: Secondary | ICD-10-CM | POA: Diagnosis present

## 2023-09-08 DIAGNOSIS — I480 Paroxysmal atrial fibrillation: Secondary | ICD-10-CM | POA: Diagnosis present

## 2023-09-08 DIAGNOSIS — J479 Bronchiectasis, uncomplicated: Secondary | ICD-10-CM | POA: Diagnosis not present

## 2023-09-08 DIAGNOSIS — I252 Old myocardial infarction: Secondary | ICD-10-CM | POA: Diagnosis not present

## 2023-09-08 DIAGNOSIS — Z8249 Family history of ischemic heart disease and other diseases of the circulatory system: Secondary | ICD-10-CM | POA: Diagnosis not present

## 2023-09-08 DIAGNOSIS — N184 Chronic kidney disease, stage 4 (severe): Secondary | ICD-10-CM | POA: Diagnosis present

## 2023-09-08 DIAGNOSIS — J439 Emphysema, unspecified: Secondary | ICD-10-CM | POA: Diagnosis not present

## 2023-09-08 DIAGNOSIS — D509 Iron deficiency anemia, unspecified: Secondary | ICD-10-CM | POA: Diagnosis present

## 2023-09-08 DIAGNOSIS — Z1152 Encounter for screening for COVID-19: Secondary | ICD-10-CM | POA: Diagnosis not present

## 2023-09-08 DIAGNOSIS — R1013 Epigastric pain: Secondary | ICD-10-CM | POA: Diagnosis not present

## 2023-09-08 DIAGNOSIS — I4892 Unspecified atrial flutter: Secondary | ICD-10-CM | POA: Diagnosis present

## 2023-09-08 DIAGNOSIS — Z79890 Hormone replacement therapy (postmenopausal): Secondary | ICD-10-CM | POA: Diagnosis not present

## 2023-09-08 DIAGNOSIS — D631 Anemia in chronic kidney disease: Secondary | ICD-10-CM | POA: Diagnosis present

## 2023-09-08 DIAGNOSIS — D649 Anemia, unspecified: Secondary | ICD-10-CM | POA: Diagnosis not present

## 2023-09-08 DIAGNOSIS — K222 Esophageal obstruction: Secondary | ICD-10-CM | POA: Diagnosis present

## 2023-09-08 DIAGNOSIS — K31811 Angiodysplasia of stomach and duodenum with bleeding: Secondary | ICD-10-CM | POA: Diagnosis present

## 2023-09-08 LAB — TYPE AND SCREEN
ABO/RH(D): A POS
Antibody Screen: NEGATIVE
Unit division: 0
Unit division: 0
Unit division: 0

## 2023-09-08 LAB — BPAM RBC
Blood Product Expiration Date: 202507042359
Blood Product Expiration Date: 202507042359
Blood Product Expiration Date: 202507042359
ISSUE DATE / TIME: 202506021235
ISSUE DATE / TIME: 202506021539
ISSUE DATE / TIME: 202506041045
Unit Type and Rh: 6200
Unit Type and Rh: 6200
Unit Type and Rh: 6200

## 2023-09-08 LAB — CBC
HCT: 28.2 % — ABNORMAL LOW (ref 36.0–46.0)
Hemoglobin: 8.9 g/dL — ABNORMAL LOW (ref 12.0–15.0)
MCH: 29.1 pg (ref 26.0–34.0)
MCHC: 31.6 g/dL (ref 30.0–36.0)
MCV: 92.2 fL (ref 80.0–100.0)
Platelets: DECREASED 10*3/uL (ref 150–400)
RBC: 3.06 MIL/uL — ABNORMAL LOW (ref 3.87–5.11)
RDW: 20 % — ABNORMAL HIGH (ref 11.5–15.5)
WBC: 14.1 10*3/uL — ABNORMAL HIGH (ref 4.0–10.5)
nRBC: 0 % (ref 0.0–0.2)

## 2023-09-08 LAB — TROPONIN I (HIGH SENSITIVITY)
Troponin I (High Sensitivity): 35 ng/L — ABNORMAL HIGH (ref <18)
Troponin I (High Sensitivity): 32 ng/L — ABNORMAL HIGH (ref <18)

## 2023-09-08 MED ORDER — NITROGLYCERIN 2 % TD OINT
0.5000 [in_us] | TOPICAL_OINTMENT | Freq: Once | TRANSDERMAL | Status: AC
  Administered 2023-09-08: 0.5 [in_us] via TOPICAL
  Filled 2023-09-08: qty 1

## 2023-09-08 MED ORDER — APIXABAN 2.5 MG PO TABS
2.5000 mg | ORAL_TABLET | Freq: Two times a day (BID) | ORAL | Status: DC
  Administered 2023-09-08 – 2023-09-12 (×8): 2.5 mg via ORAL
  Filled 2023-09-08 (×8): qty 1

## 2023-09-08 MED ORDER — HYDROMORPHONE HCL 1 MG/ML IJ SOLN
0.5000 mg | INTRAMUSCULAR | Status: DC | PRN
  Administered 2023-09-08 – 2023-09-09 (×3): 0.5 mg via INTRAVENOUS
  Filled 2023-09-08 (×3): qty 0.5

## 2023-09-08 MED ORDER — SODIUM CHLORIDE 0.9 % IV SOLN
1.0000 g | INTRAVENOUS | Status: AC
  Administered 2023-09-08 – 2023-09-12 (×5): 1 g via INTRAVENOUS
  Filled 2023-09-08 (×5): qty 10

## 2023-09-08 MED ORDER — ALBUTEROL SULFATE (2.5 MG/3ML) 0.083% IN NEBU: 2.5000 mg | INHALATION_SOLUTION | RESPIRATORY_TRACT | Status: DC | PRN

## 2023-09-08 MED ORDER — DOXYCYCLINE HYCLATE 100 MG PO TABS
100.0000 mg | ORAL_TABLET | Freq: Two times a day (BID) | ORAL | Status: DC
  Administered 2023-09-08 – 2023-09-12 (×9): 100 mg via ORAL
  Filled 2023-09-08 (×9): qty 1

## 2023-09-08 NOTE — Significant Event (Addendum)
 Called overhead for RRT activated for acute episode of lower chest / epigastric pain radiating to back. Rates 9/10, started few min ago. On eval BP 107/58, HR 80, RR 20, on 4L O2, afebrile. On exam, heart regular, CTAB, Abd round and hyperresonant but nontender. Data with recent EGD with dilated shatski ring and gastric AVM without bleeding treated with APC, SB enteroscopy with hemorrhagic prox jejunum punctate area of bleeding, ? Duelafoy lesion, one hemostatic clip placed. Hb 8 slight downtrend from prior   A/P  Chest / epigastric pain, radiating to back; suspect GI given her initial presentation.  - EKG obtained, artifact limiting interpretation but no overt ischemic changes.  - Serial trop q 3 hr x 2  - CXR and KUB with recent GI intervention.  - Trial NTG paste 0.5 inch.   Update:  Chest pain improved with NTG ointment. Serial trop flat, suspect myocardial injury demand from prior anemia. Did have fever this AM and CXR with worsening L hilar infiltrate. Added abx Ceftriaxone , Azithromycin .   Arnulfo Larch, MD  [facility]

## 2023-09-08 NOTE — Progress Notes (Signed)
 Progress Note   Patient: Patricia Jackson ZOX:[id number] DOB: Mar 08, 1941 DOA: 09/05/2023     0 DOS: the patient was seen and examined on 09/08/2023   Brief hospital admission narrative: Darthy Manganelli is a 83 y.o. female with medical history significant of history of atrial fibrillation on chronic anticoagulation, COPD, hypothyroidism, hypertension, hyperlipidemia, chronic kidney disease, dysphagia and anemia of chronic kidney disease and chronic GI bleed; who presented to the hospital secondary to shortness of breath with activity and increased fatigue.  No chest pain, no fever, there is no reports of hematemesis/hematochezia or melena.  No sick contacts, no hematuria, dysuria, focal weaknesses or any other complaints.   Workup in the ED demonstrating positive fecal occult blood test; hemoglobin 5.8.  Of note patient actively receiving iron  infusions as an outpatient.   2 units PRBCs have been ordered; GI service contacted and TRH consulted to place patient in the hospital for further evaluation and management.  Assessment and plan 1-acute on chronic anemia - In the setting of GI bleed; - Status post 3 units PRBCs - Hemoglobin around 8.9 currently - Hemodynamically stable. - Continue PPI twice a day - Per GI service safe to resume Eliquis .  2-dysphagia - Status post Schatzki ring dilatation - Continue assessing swallowing capability. - Continue PPI.  3-paroxysmal atrial fibrillation - Safe to resume Eliquis  today per GI service. - Continue the use of amiodarone  for rate control - Patient currently in sinus rhythm.  4-essential hypertension - Continue amlodipine  - Follow vital signs. - Blood pressure stable.  5-hypothyroidism - Continue Synthroid .  6-chronic kidney disease stage IV - Currently at baseline - Continue to follow renal function trend. - Patient advised to maintain adequate hydration.  7-history of COPD - No exacerbation currently appreciated - Good saturation  on room air - Continue bronchodilator management.  8-depression/anxiety - Continue treatment with Wellbutrin  and sertraline  - No suicidal ideation or hallucination.  9-pneumonia/vascular congestion - Patient with fever, new infiltrates on x-ray and elevated WBCs - Continue IV antibiotics - Wean off oxygen  supplementation as tolerated - Mucinex  will be provided. - Lasix  IV provided  Subjective:  Positive fever overnight; complaining of pleuritic/atypical left-sided chest discomfort.  Chest x-ray with mild vascular congestion and new development of left lung infiltrate.  Physical Exam: Vitals:   09/08/23 0644 09/08/23 0800 09/08/23 1039 09/08/23 1539  BP: (!) 129/58  (!) 131/49 (!) 109/54  Pulse: 80  80 69  Resp: (!) 28  20 20   Temp: 99.6 F (37.6 C)  98.7 F (37.1 C) 98.4 F (36.9 C)  TempSrc: Oral  Axillary Oral  SpO2: 94% 93% 96% 97%  Weight:      Height:       General exam: Alert, awake, oriented x 3; reporting some left-sided costal pain; most likely pleuritic discomfort in the setting of pneumonia development. Respiratory system: 2 L supplementation in place; positive scattered rhonchi.  No using accessory muscles. Cardiovascular system: Rate controlled, no rubs, no gallops, no JVD. Gastrointestinal system: Abdomen is nondistended, soft and nontender. No organomegaly or masses felt. Normal bowel sounds heard. Central nervous system:  No focal neurological deficits. Extremities: No cyanosis or clubbing. Skin: No petechiae. Psychiatry: Judgement and insight appear normal. Mood & affect appropriate.   Data Reviewed: CBC: WBCs 14.1, hemoglobin 8.9 and platelet counts clumped noted on the smear count appear decreased. Troponin: 32 >> 35  Family Communication: No family at bedside.  Disposition: Status is: Observation The patient remains OBS appropriate and will d/c before 2 midnights.  Anticipate discharge back home once medically stable.  Patient safe to be transfer  to 300 unit.  Time spent: 35 minutes  Author: Justina Oman, MD 09/08/2023 5:14 PM  For on call review [email or web address].

## 2023-09-08 NOTE — Progress Notes (Signed)
 Pt called out complaining of chest pain on left side radiating toward back, vitals, EKG and assessment done, MD paged and rapid response called. New orders for 15 mins BP checks and nitroglycerin  gel orders placed.

## 2023-09-08 NOTE — Plan of Care (Signed)

## 2023-09-08 NOTE — Plan of Care (Signed)

## 2023-09-09 ENCOUNTER — Encounter

## 2023-09-09 DIAGNOSIS — N184 Chronic kidney disease, stage 4 (severe): Secondary | ICD-10-CM | POA: Diagnosis not present

## 2023-09-09 DIAGNOSIS — J189 Pneumonia, unspecified organism: Secondary | ICD-10-CM

## 2023-09-09 DIAGNOSIS — J9601 Acute respiratory failure with hypoxia: Secondary | ICD-10-CM

## 2023-09-09 DIAGNOSIS — D649 Anemia, unspecified: Secondary | ICD-10-CM | POA: Diagnosis not present

## 2023-09-09 LAB — CBC
HCT: 28.3 % — ABNORMAL LOW (ref 36.0–46.0)
Hemoglobin: 8.6 g/dL — ABNORMAL LOW (ref 12.0–15.0)
MCH: 28.2 pg (ref 26.0–34.0)
MCHC: 30.4 g/dL (ref 30.0–36.0)
MCV: 92.8 fL (ref 80.0–100.0)
Platelets: DECREASED 10*3/uL (ref 150–400)
RBC: 3.05 MIL/uL — ABNORMAL LOW (ref 3.87–5.11)
RDW: 19.8 % — ABNORMAL HIGH (ref 11.5–15.5)
WBC: 10.9 10*3/uL — ABNORMAL HIGH (ref 4.0–10.5)
nRBC: 0 % (ref 0.0–0.2)

## 2023-09-09 LAB — BASIC METABOLIC PANEL WITH GFR
Anion gap: 6 (ref 5–15)
BUN: 38 mg/dL — ABNORMAL HIGH (ref 8–23)
CO2: 20 mmol/L — ABNORMAL LOW (ref 22–32)
Calcium: 8.3 mg/dL — ABNORMAL LOW (ref 8.9–10.3)
Chloride: 112 mmol/L — ABNORMAL HIGH (ref 98–111)
Creatinine, Ser: 1.53 mg/dL — ABNORMAL HIGH (ref 0.44–1.00)
GFR, Estimated: 34 mL/min — ABNORMAL LOW (ref >60)
Glucose, Bld: 86 mg/dL (ref 70–99)
Potassium: 5 mmol/L (ref 3.5–5.1)
Sodium: 138 mmol/L (ref 135–145)

## 2023-09-09 NOTE — Plan of Care (Signed)

## 2023-09-09 NOTE — Anesthesia Postprocedure Evaluation (Signed)
 Anesthesia Post Note  Patient: Patricia Jackson  Procedure(s) Performed: EGD (ESOPHAGOGASTRODUODENOSCOPY) ENTEROSCOPY  Patient location during evaluation: Phase II Anesthesia Type: General Level of consciousness: awake Pain management: pain level controlled Vital Signs Assessment: post-procedure vital signs reviewed and stable Respiratory status: spontaneous breathing and respiratory function stable Cardiovascular status: blood pressure returned to baseline and stable Postop Assessment: no headache and no apparent nausea or vomiting Anesthetic complications: no Comments: Late entry   No notable events documented.   Last Vitals:  Vitals:   09/09/23 0852 09/09/23 1329  BP:  131/62  Pulse:  73  Resp:  17  Temp:  36.8 C  SpO2: 94% 96%    Last Pain:  Vitals:   09/09/23 1329  TempSrc: Oral  PainSc:                  [name]

## 2023-09-09 NOTE — Progress Notes (Addendum)
 [facility] PROGRESS NOTE   Patricia Jackson BMW:[id number] DOB: 13-Jul-1940 DOA: 09/05/2023  PCP: Omie Bickers, MD  Brief History: 83 y.o. female with medical history significant of history of atrial fibrillation on chronic anticoagulation, COPD, hypothyroidism, hypertension, hyperlipidemia, chronic kidney disease, dysphagia and anemia of chronic kidney disease and chronic GI bleed; who presented to the hospital secondary to shortness of breath with activity and increased fatigue. Workup in the ED demonstrating positive fecal occult blood test; hemoglobin 5.8.  Of note patient actively receiving iron  infusions as an outpatient.  Patient was hospitalized for further management.  Consultants: Gastroenterology  Procedures: EGD    Subjective/Interval History: Patient complains of cough which is mostly dry.  Complains of left-sided chest pain which she says has been ongoing for several days.    Assessment/Plan:  Pneumonia/acute respiratory failure with hypoxia Patient with cough.  Chest x-ray raise concern for pneumonia versus vascular congestion.  Noted to be febrile at 100.7.  WBC was noted to be elevated. Patient started on IV antibiotics.  Noted to be on ceftriaxone  and doxycycline . Remains on oxygen  by nasal cannula. Will repeat chest x-ray tomorrow morning. Left-sided chest pain mentioned by patient.  EKG without any ischemic changes.  Troponin levels are only mildly elevated and flat trend noted.  Unlikely to be cardiac in etiology.  Could be from pneumonia.  Acute on chronic anemia Thought to be in the setting of GI bleed.  Received 3 units of PRBC in the hospital.  Hemoglobin 8.6 this morning.  PPI being continued.  Underwent upper endoscopy as discussed below.  Dysphagia Seen by gastroenterology.  Underwent EGD with dilatation of the Schatzki's ring.  Seems to be tolerating her diet this morning.  Continue to monitor.  Paroxysmal atrial fibrillation Patient on  amiodarone .  Eliquis  was resumed as well.  Chronic kidney disease stage IV Baseline creatinine seems to be around 1.5. Fluctuations in creatinine noted during this admission.  Monitor urine output.  Avoid nephrotoxic agents.  Hypothyroidism Continue levothyroxine .  Essential hypertension Monitor blood pressures closely.  History of COPD Likely contributing to her dyspnea and cough.  No active wheezing noted this morning.  History of depression and anxiety Continue home medications.  DVT Prophylaxis: On apixaban  Code Status: Full code Family Communication: Discussed with patient Disposition Plan: Home when improved     Medications: Scheduled:  amiodarone   200 mg Oral Once per day on Monday Tuesday Wednesday Thursday Friday Saturday   amLODipine   5 mg Oral Daily   apixaban   2.5 mg Oral BID   budesonide -glycopyrrolate-formoterol   2 puff Inhalation BID   buPROPion   150 mg Oral Daily   doxycycline   100 mg Oral Q12H   levothyroxine   50 mcg Oral QAC breakfast   lidocaine   1 patch Transdermal Q24H   linaclotide   290 mcg Oral QAC breakfast   mirtazapine  7.5 mg Oral Daily   pantoprazole  (PROTONIX ) IV  40 mg Intravenous Q12H   rosuvastatin   20 mg Oral Daily   sertraline   25 mg Oral Daily   traZODone  50 mg Oral QHS   Continuous:  cefTRIAXone  (ROCEPHIN )  IV 1 g (09/09/23 0516)   PRN:acetaminophen  **OR** acetaminophen , albuterol , HYDROmorphone  (DILAUDID ) injection, ondansetron  **OR** ondansetron  (ZOFRAN ) IV  Antibiotics: Anti-infectives (From admission, onward)    Start     Dose/Rate Route Frequency Ordered Stop   09/08/23 1000  doxycycline  (VIBRA -TABS) tablet 100 mg        100 mg Oral Every 12 hours 09/08/23 0507 09/13/23 0959  09/08/23 0600  cefTRIAXone  (ROCEPHIN ) 1 g in sodium chloride  0.9 % 100 mL IVPB        1 g 200 mL/hr over 30 Minutes Intravenous Every 24 hours 09/08/23 0507 09/13/23 0559       Objective:  Vital Signs  Vitals:   09/08/23 1946 09/08/23  2050 09/09/23 0432 09/09/23 0852  BP: 130/64  (!) 146/73   Pulse: 76  82   Resp: 20  (!) 22   Temp: 98.4 F (36.9 C)  98 F (36.7 C)   TempSrc: Oral  Oral   SpO2: 94% 93% 99% 94%  Weight:      Height:        Intake/Output Summary (Last 24 hours) at 09/09/2023 1001 Last data filed at 09/08/2023 1348 Gross per 24 hour  Intake 67.31 ml  Output --  Net 67.31 ml   Filed Weights   09/05/23 0952  Weight: 59.4 kg    General appearance: Awake alert.  In no distress Resp: Coarse sounds bilaterally.  Tachypneic at rest.  No wheezing or rhonchi.  Few crackles at the bases. Cardio: S1-S2 is normal regular.  No S3-S4.  No rubs murmurs or bruit GI: Abdomen is soft.  Nontender nondistended.  Bowel sounds are present normal.  No masses organomegaly Extremities: No edema.  Full range of motion of lower extremities. Neurologic: Alert and oriented x3.  No focal neurological deficits.    Lab Results:  Data Reviewed: I have personally reviewed following labs and reports of the imaging studies  CBC: Recent Labs  Lab 09/05/23 1012 09/05/23 2001 09/06/23 0450 09/07/23 0429 09/08/23 0015 09/09/23 0419  WBC 8.7  --  5.9 12.5* 14.1* 10.9*  HGB 5.8* 8.5* 8.3* 8.0* 8.9* 8.6*  HCT 21.2* 26.7* 27.2* 25.7* 28.2* 28.3*  MCV 96.8  --  92.5 92.4 92.2 92.8  PLT PLATELET CLUMPS NOTED ON SMEAR, COUNT APPEARS DECREASED  --  PLATELET CLUMPS NOTED ON SMEAR, UNABLE TO ESTIMATE PLATELET CLUMPS NOTED ON SMEAR, COUNT APPEARS DECREASED PLATELET CLUMPS NOTED ON SMEAR, COUNT APPEARS DECREASED PLATELET CLUMPS NOTED ON SMEAR, COUNT APPEARS DECREASED    Basic Metabolic Panel: Recent Labs  Lab 09/05/23 1012 09/05/23 1046 09/06/23 0450 09/09/23 0419  NA 140  --  136 138  K 4.4  --  4.5 5.0  CL 114*  --  113* 112*  CO2 17*  --  20* 20*  GLUCOSE 99  --  92 86  BUN 34*  --  39* 38*  CREATININE 1.35*  --  1.18* 1.53*  CALCIUM  8.7*  --  8.9 8.3*  MG  --  1.9  --   --     GFR: Estimated Creatinine Clearance:  27.5 mL/min (A) (by C-G formula based on SCr of 1.53 mg/dL (H)).  Liver Function Tests: Recent Labs  Lab 09/05/23 1046  AST 38  ALT 20  ALKPHOS 41  BILITOT 0.5  PROT 5.7*  ALBUMIN  2.9*     Recent Results (from the past 240 hours)  Resp panel by RT-PCR (RSV, Flu A&B, Covid) Anterior Nasal Swab     Status: None   Collection Time: 09/05/23 10:55 AM   Specimen: Anterior Nasal Swab  Result Value Ref Range Status   SARS Coronavirus 2 by RT PCR NEGATIVE NEGATIVE Final    Comment: (NOTE) SARS-CoV-2 target nucleic acids are NOT DETECTED.  The SARS-CoV-2 RNA is generally detectable in upper respiratory specimens during the acute phase of infection. The lowest concentration of SARS-CoV-2 viral copies this  assay can detect is 138 copies/mL. A negative result does not preclude SARS-Cov-2 infection and should not be used as the sole basis for treatment or other patient management decisions. A negative result may occur with  improper specimen collection/handling, submission of specimen other than nasopharyngeal swab, presence of viral mutation(s) within the areas targeted by this assay, and inadequate number of viral copies(<138 copies/mL). A negative result must be combined with clinical observations, patient history, and epidemiological information. The expected result is Negative.  Fact Sheet for Patients:  BloggerCourse.com  Fact Sheet for Healthcare Providers:  SeriousBroker.it  This test is no t yet approved or cleared by the [place]  FDA and  has been authorized for detection and/or diagnosis of SARS-CoV-2 by FDA under an Emergency Use Authorization (EUA). This EUA will remain  in effect (meaning this test can be used) for the duration of the COVID-19 declaration under Section 564(b)(1) of the Act, 21 U.S.C.section 360bbb-3(b)(1), unless the authorization is terminated  or revoked sooner.       Influenza A by PCR  NEGATIVE NEGATIVE Final   Influenza B by PCR NEGATIVE NEGATIVE Final    Comment: (NOTE) The Xpert Xpress SARS-CoV-2/FLU/RSV plus assay is intended as an aid in the diagnosis of influenza from Nasopharyngeal swab specimens and should not be used as a sole basis for treatment. Nasal washings and aspirates are unacceptable for Xpert Xpress SARS-CoV-2/FLU/RSV testing.  Fact Sheet for Patients: BloggerCourse.com  Fact Sheet for Healthcare Providers: SeriousBroker.it  This test is not yet approved or cleared by the [place]  FDA and has been authorized for detection and/or diagnosis of SARS-CoV-2 by FDA under an Emergency Use Authorization (EUA). This EUA will remain in effect (meaning this test can be used) for the duration of the COVID-19 declaration under Section 564(b)(1) of the Act, 21 U.S.C. section 360bbb-3(b)(1), unless the authorization is terminated or revoked.     Resp Syncytial Virus by PCR NEGATIVE NEGATIVE Final    Comment: (NOTE) Fact Sheet for Patients: BloggerCourse.com  Fact Sheet for Healthcare Providers: SeriousBroker.it  This test is not yet approved or cleared by the [place]  FDA and has been authorized for detection and/or diagnosis of SARS-CoV-2 by FDA under an Emergency Use Authorization (EUA). This EUA will remain in effect (meaning this test can be used) for the duration of the COVID-19 declaration under Section 564(b)(1) of the Act, 21 U.S.C. section 360bbb-3(b)(1), unless the authorization is terminated or revoked.  Performed at [facility], [address], [place], [place] [address]   MRSA Next Gen by PCR, Nasal     Status: None   Collection Time: 09/05/23  2:09 PM   Specimen: Nasal Mucosa; Nasal Swab  Result Value Ref Range Status   MRSA by PCR Next Gen NOT DETECTED NOT DETECTED Final    Comment: (NOTE) The GeneXpert MRSA Assay (FDA  approved for NASAL specimens only), is one component of a comprehensive MRSA colonization surveillance program. It is not intended to diagnose MRSA infection nor to guide or monitor treatment for MRSA infections. Test performance is not FDA approved in patients less than 8 years old. Performed at [facility], [address], [place], [place] [address]       Radiology Studies: DG CHEST PORT 1 VIEW Result Date: 09/08/2023 CLINICAL DATA:  Epigastric pain EXAM: PORTABLE CHEST 1 VIEW COMPARISON:  09/07/2023 FINDINGS: Heart size and pulmonary vascularity are normal. Emphysematous changes in the lungs. Diffuse coarse interstitial pattern to the lungs likely representing fibrosis. Peribronchial thickening and  bronchiectasis suggesting chronic bronchitis. Possible superimposed airspace disease in the left mid and lower lung, progressing since prior study. This may indicate superimposed pneumonia or less likely asymmetrical edema. No pleural effusion or pneumothorax. Mediastinal contours appear intact. Calcification of the aorta. Surgical clips in the left axilla. IMPRESSION: 1. Emphysematous changes, fibrosis, and chronic bronchitic changes in the lungs. 2. Suggestion of developing left perihilar airspace disease, possibly superimposed pneumonia. Electronically Signed   By: Boyce Byes M.D.   On: 09/08/2023 00:57   DG Abd 1 View Result Date: 09/08/2023 CLINICAL DATA:  Epigastric pain. EXAM: ABDOMEN - 1 VIEW COMPARISON:  None Available. FINDINGS: Gas and stool throughout the colon. No small or large bowel distention. No abnormal air-fluid levels. No free intra-abdominal air. No radiopaque stones. Vascular calcifications. Degenerative changes in the spine and hips. Scarring suggested in the lung bases. IMPRESSION: Normal nonobstructive bowel gas pattern. Electronically Signed   By: Boyce Byes M.D.   On: 09/08/2023 00:56   DG Chest 2 View Result Date: 09/07/2023 CLINICAL DATA:  Shortness of breath  EXAM: CHEST - 2 VIEW COMPARISON:  Chest x-ray 09/05/2023 FINDINGS: Cardiomediastinal silhouette is within normal limits. There are diffuse interstitial opacities throughout both lungs, increased throughout the left lung. There some minimal patchy opacities in the left costophrenic angle. There is no pleural effusion or pneumothorax. No acute fractures are seen. There are left axillary surgical clips. IMPRESSION: Diffuse interstitial opacities throughout both lungs, increased throughout the left lung. Findings may be due to edema or infection. Electronically Signed   By: Tyron Gallon M.D.   On: 09/07/2023 18:12       LOS: 1 day   Damyra Luscher  [facility] Pager on [email or web address]  09/09/2023, 10:01 AM

## 2023-09-09 NOTE — Evaluation (Signed)
 Clinical/Bedside Swallow Evaluation Patient Details  Name: Patricia Jackson MRN: [id number] Date of Birth: 1940-08-24  Today's Date: 09/09/2023 Time: SLP Start Time (ACUTE ONLY): 1450 (1513) SLP Stop Time (ACUTE ONLY): 1513 SLP Time Calculation (min) (ACUTE ONLY): 23 min  Past Medical History:  Past Medical History:  Diagnosis Date   A-fib [facility])    Atrial flutter (HCC)    On Eliquis  in 8/17 but discontinued after stent placement   Breast cancer (HCC)    remote   CAD in native artery 03/16/2016   COPD (chronic obstructive pulmonary disease) (HCC)    Dysrhythmia    Hypertension    Iron  deficiency anemia 06/12/2021   NSTEMI (non-ST elevated myocardial infarction) (HCC)    2017   S/P angioplasty with stent 03/15/16 DES Resolute, pLCX 03/16/2016   Vitreous hemorrhage of left eye (HCC) 08/01/2019   Past Surgical History:  Past Surgical History:  Procedure Laterality Date   APPENDECTOMY     BREAST SURGERY Left    CARDIAC CATHETERIZATION N/A 03/15/2016   Procedure: Left Heart Cath and Coronary Angiography;  Surgeon: Arty Binning, MD;  Location: [facility] INVASIVE CV LAB;  Service: Cardiovascular;  Laterality: N/A;   CARDIAC CATHETERIZATION N/A 03/15/2016   Procedure: Coronary Stent Intervention;  Surgeon: Arty Binning, MD;  Location: [facility] INVASIVE CV LAB;  Service: Cardiovascular;  Laterality: N/A;   CARDIOVERSION N/A 02/04/2023   Procedure: CARDIOVERSION;  Surgeon: Lasalle Pointer, MD;  Location: [facility];  Service: Cardiovascular;  Laterality: N/A;   COLONOSCOPY     remote   COLONOSCOPY N/A 05/26/2016   Procedure: COLONOSCOPY;  Surgeon: Suzette Espy, MD;  Location: [facility];  Service: Endoscopy;  Laterality: N/A;  845   COLONOSCOPY N/A 06/17/2023   Procedure: COLONOSCOPY;  Surgeon: Suzette Espy, MD;  Location: [facility];  Service: Endoscopy;  Laterality: N/A;   ENTEROSCOPY N/A 09/06/2023   Procedure: ENTEROSCOPY;  Surgeon: Urban Garden, MD;  Location: [facility];  Service: Gastroenterology;  Laterality: N/A;   ESOPHAGOGASTRODUODENOSCOPY N/A 05/26/2016   Procedure: ESOPHAGOGASTRODUODENOSCOPY (EGD);  Surgeon: Suzette Espy, MD;  [facility]: [facility];  Service: Endoscopy;  Laterality: N/A;   ESOPHAGOGASTRODUODENOSCOPY N/A 06/16/2023   Procedure: EGD (ESOPHAGOGASTRODUODENOSCOPY);  Surgeon: Hargis Lias, MD;  [facility];  Service: Endoscopy;  Laterality: N/A;   ESOPHAGOGASTRODUODENOSCOPY N/A 09/06/2023   Procedure: EGD (ESOPHAGOGASTRODUODENOSCOPY);  Surgeon: Umberto Ganong, Bearl Limes, MD;  Location: [facility];  Service: Gastroenterology;  Laterality: N/A;  with possible dilation   ESOPHAGOGASTRODUODENOSCOPY (EGD) WITH PROPOFOL  N/A 11/22/2022   Procedure: ESOPHAGOGASTRODUODENOSCOPY (EGD) WITH PROPOFOL ;  Surgeon: Vinetta Greening, DO;  [facility];  Service: Endoscopy;  Laterality: N/A;   EYE SURGERY     EYE SURGERY Left    Dr. Katheleen Palmer   HEMOSTASIS CLIP PLACEMENT  06/17/2023   Procedure: CONTROL OF HEMORRHAGE, GI TRACT, ENDOSCOPIC, BY CLIPPING OR OVERSEWING;  Surgeon: Suzette Espy, MD;  [facility];  Service: Endoscopy;;   HOT HEMOSTASIS  11/22/2022   Procedure: HOT HEMOSTASIS (ARGON PLASMA COAGULATION/BICAP);  Surgeon: Vinetta Greening, DO;  [facility];  Service: Endoscopy;;   HOT HEMOSTASIS  06/16/2023   Procedure: EGD, WITH ARGON PLASMA COAGULATION;  Surgeon: Hargis Lias, MD;  [facility]: [facility];  Service: Endoscopy;;   MALONEY DILATION N/A 05/26/2016   Procedure: Londa Rival DILATION;  Surgeon: Suzette Espy, MD;  [facility];  Service: Endoscopy;  Laterality: N/A;   POLYPECTOMY  06/17/2023   Procedure: POLYPECTOMY;  Surgeon: Suzette Espy, MD;  [facility];  Service: Endoscopy;;   SCLEROTHERAPY  06/17/2023   Procedure: Daryle Eon;  Surgeon: Suzette Espy, MD;  [facility];  Service: Endoscopy;;   TEE WITHOUT CARDIOVERSION N/A 02/04/2023    Procedure: TRANSESOPHAGEAL ECHOCARDIOGRAM (TEE);  Surgeon: Mallipeddi, Vishnu P, MD;  Location: [facility];  Service: Cardiovascular;  Laterality: N/A;   TOOTH EXTRACTION N/A 01/15/2022   Procedure: DENTAL RESTORATION/EXTRACTIONS;  Surgeon: Ascencion Lava, DMD;  Location: [facility];  Service: Oral Surgery;  Laterality: N/A;   VITRECTOMY Left 10/03/2019   Dr. Seward Dao, Vitrectomy, Focal Laser, Removal of Silicone Oil   HPI:  Pt is an 83 y.o. female with PMHx significant for: COPD, CKD, Afib on chronic anticoagulation, hypothyroidism, HTN, HLD, anxiety, depression, GERD, esophageal dysphagia with Schatzki ring and hiatal hernia, and chronic GI bleed. Pt admitted 09/05/23 due to SOB and increased fatigue, acute on chronic anemia. Since admission, pt febrile with worsening cough and required supplemental O2. Chest x-ray 09/09/23 questioned PNA vs vascular congestion.   EGD 09/06/23: "Low-grade of narrowing Schatzki ring.  Dilated. 1 cm hiatal hernia. A single non-bleeding angiodysplastic lesion in the stomach. Treated with argon plasma coagulation (APC). Normal examined duodenum".   Assessment / Plan / Recommendation  Clinical Impression  A bedside swallow evaluation was completed via chart review, patient interview, and PO trials. Pt currently on a mechanical soft diet and reports her baseline diet is soft as well due to her dentition (does not wear dentures at home). She endorses occasional globus sensation, pointing to her stenum. She states she uses strategies such as a slow rate and alternating solids/liquids to manage it. She denied odynophagia and regular coughing or choking during PO intake. States shes coughs during PO "sometimes". An oral mech was unremarkable aside from edentulous on top and minimal dentition on bottom. She is HOH, no hearing aids present. Pt on 1L of O2 via [place] during eval. The pt fed herself PO trials of ice via spoon, thin liquid water  via tsp/cup sip/straw, and regular texture peanut butter  crackers. She politely declined trials of puree or mech soft. No overt s/sx of aspiration were observed across consistencies. Prolonged and effortful mastication was observed with regular textures but the pt was able to safely navigate it. Although the pt declined mech soft trials, it does not appear that she would have difficulty managing them based on this evaluation.   Pt presents with a mild oral phase dysphagia 2/2 poor dentition and a documented esophageal dysphagia (s/p esophageal dilation). Discussed diet texture options and pt reported that she would like to remain on Dys 3 as it is similiar to what she eats at home. SLP reviewed esophageal dysphagia strategies and pt verbalized understanding. No further ST services warranted at this time. Thank you for this referral.  SLP Visit Diagnosis: Dysphagia, oral phase (R13.11)    Aspiration Risk  Mild aspiration risk    Diet Recommendation Dysphagia 3 (Mech soft);Thin liquid    Liquid Administration via: Cup;Straw Medication Administration: Whole meds with liquid Supervision: Patient able to self feed Compensations: Slow rate;Minimize environmental distractions;Small sips/bites;Follow solids with liquid Postural Changes: Seated upright at 90 degrees;Remain upright for at least 30 minutes after po intake    Other  Recommendations Oral Care Recommendations: Oral care BID    Functional Status Assessment Patient has had a recent decline in their functional status and demonstrates the ability to make significant improvements in function  in a reasonable and predictable amount of time.    Swallow Study   General Date of Onset: 09/09/23 HPI: Pt is an 83 y.o. female with PMHx significant for: COPD, CKD, Afib on chronic anticoagulation, hypothyroidism, HTN, HLD, anxiety, depression, GERD, esophageal dysphagia, and chronic GI bleed. Pt admitted 09/05/23 due to SOB and increased fatigue, acute on chronic anemia. Since admission, pt febrile with worsening  cough and required supplemental O2. Chest x-ray 09/09/23 questioned PNA vs vascular congestion.  EGD 09/06/23: "Low-grade of narrowing Schatzki ring.  Dilated.   - 1 cm hiatal hernia.   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  Treated with argon plasma coagulation (APC).   - Normal examined duodenum" Type of Study: Bedside Swallow Evaluation Previous Swallow Assessment: No Diet Prior to this Study: Dysphagia 3 (mechanical soft) Temperature Spikes Noted: Yes Respiratory Status: Nasal cannula (1L) History of Recent Intubation: No Behavior/Cognition: Cooperative;Pleasant mood;Alert Oral Cavity Assessment: Dry Oral Care Completed by SLP: Other (Comment) (Pt declined) Oral Cavity - Dentition: Other (Comment);Poor condition;Missing dentition (Some natural teeth on bottom, does not wear dentures) Vision: Functional for self-feeding Self-Feeding Abilities: Able to feed self Patient Positioning: Upright in bed Baseline Vocal Quality: Normal Volitional Cough: Strong Volitional Swallow: Able to elicit    Oral/Motor/Sensory Function Overall Oral Motor/Sensory Function: Within functional limits   Ice Chips Ice chips: Within functional limits Presentation: Spoon   Thin Liquid Thin Liquid: Within functional limits Presentation: Cup;Spoon;Straw    Nectar Thick Nectar Thick Liquid: Not tested   Honey Thick Honey Thick Liquid: Not tested   Puree Puree: Not tested (Pt declined)   Solid     Solid: Impaired (Prolonged and difficult mastication d/t poor dentition) Presentation: Self Fed Oral Phase Impairments: Impaired mastication Oral Phase Functional Implications: Impaired mastication      Caretha Chapel, MA CCC-SLP Speech-Language Pathologist 09/09/2023,3:40 PM

## 2023-09-09 NOTE — Plan of Care (Signed)

## 2023-09-10 ENCOUNTER — Inpatient Hospital Stay (HOSPITAL_COMMUNITY)

## 2023-09-10 DIAGNOSIS — N184 Chronic kidney disease, stage 4 (severe): Secondary | ICD-10-CM | POA: Diagnosis not present

## 2023-09-10 DIAGNOSIS — J9601 Acute respiratory failure with hypoxia: Secondary | ICD-10-CM | POA: Diagnosis not present

## 2023-09-10 DIAGNOSIS — D649 Anemia, unspecified: Secondary | ICD-10-CM | POA: Diagnosis not present

## 2023-09-10 DIAGNOSIS — J189 Pneumonia, unspecified organism: Secondary | ICD-10-CM | POA: Diagnosis not present

## 2023-09-10 LAB — BASIC METABOLIC PANEL WITH GFR
Anion gap: 7 (ref 5–15)
BUN: 39 mg/dL — ABNORMAL HIGH (ref 8–23)
CO2: 19 mmol/L — ABNORMAL LOW (ref 22–32)
Calcium: 8.3 mg/dL — ABNORMAL LOW (ref 8.9–10.3)
Chloride: 114 mmol/L — ABNORMAL HIGH (ref 98–111)
Creatinine, Ser: 1.45 mg/dL — ABNORMAL HIGH (ref 0.44–1.00)
GFR, Estimated: 36 mL/min — ABNORMAL LOW (ref >60)
Glucose, Bld: 128 mg/dL — ABNORMAL HIGH (ref 70–99)
Potassium: 4.6 mmol/L (ref 3.5–5.1)
Sodium: 140 mmol/L (ref 135–145)

## 2023-09-10 LAB — CBC
HCT: 27.4 % — ABNORMAL LOW (ref 36.0–46.0)
Hemoglobin: 8.4 g/dL — ABNORMAL LOW (ref 12.0–15.0)
MCH: 28.4 pg (ref 26.0–34.0)
MCHC: 30.7 g/dL (ref 30.0–36.0)
MCV: 92.6 fL (ref 80.0–100.0)
Platelets: DECREASED 10*3/uL (ref 150–400)
RBC: 2.96 MIL/uL — ABNORMAL LOW (ref 3.87–5.11)
RDW: 19.7 % — ABNORMAL HIGH (ref 11.5–15.5)
WBC: 7.4 10*3/uL (ref 4.0–10.5)
nRBC: 0 % (ref 0.0–0.2)

## 2023-09-10 MED ORDER — PANTOPRAZOLE SODIUM 40 MG PO TBEC
40.0000 mg | DELAYED_RELEASE_TABLET | Freq: Two times a day (BID) | ORAL | Status: DC
  Administered 2023-09-10 – 2023-09-12 (×4): 40 mg via ORAL
  Filled 2023-09-10 (×4): qty 1

## 2023-09-10 NOTE — Progress Notes (Signed)
 [facility] PROGRESS NOTE   Auden Wettstein UJW:[id number] DOB: March 25, 1941 DOA: 09/05/2023  PCP: Omie Bickers, MD  Brief History: 83 y.o. female with medical history significant of history of atrial fibrillation on chronic anticoagulation, COPD, hypothyroidism, hypertension, hyperlipidemia, chronic kidney disease, dysphagia and anemia of chronic kidney disease and chronic GI bleed; who presented to the hospital secondary to shortness of breath with activity and increased fatigue. Workup in the ED demonstrating positive fecal occult blood test; hemoglobin 5.8.  Of note patient actively receiving iron  infusions as an outpatient.  Patient was hospitalized for further management.  Consultants: Gastroenterology  Procedures: EGD    Subjective/Interval History: Feels somewhat better in terms of her shortness of breath and cough but cough still persist.  No other complaints offered.  Tolerating her diet.  No bleeding episodes noted.   Assessment/Plan:  Pneumonia/acute respiratory failure with hypoxia Patient with cough.  Chest x-ray raise concern for pneumonia versus vascular congestion.  Was noted to be febrile at 100.7.  WBC was noted to be elevated. Patient started on IV antibiotics.  Noted to be on ceftriaxone  and doxycycline . Remains on oxygen  by nasal cannula. Pretty status has improved.  Still requiring oxygen .  Will repeat chest x-ray today. Transition to oral antibiotics in the next 24 to 48 hours. Has had left-sided chest pain.  EKG without any ischemic changes.  Troponin levels are only mildly elevated and flat trend noted.  Unlikely to be cardiac in etiology.  Could be from pneumonia.  Acute on chronic anemia Thought to be in the setting of GI bleed.  Received 3 units of PRBC in the hospital.   Continue PPI. Underwent upper endoscopy as discussed below. Hemoglobin is stable.   Platelet clumps are noted.  Unable to specify a value.  Counts appear decreased.  Last time  value available was in November 2024 when it was 113,000.  Dysphagia Seen by gastroenterology.  Underwent EGD with dilatation of the Schatzki's ring.  Seems to be tolerating her diet.  Continue to monitor.  Paroxysmal atrial fibrillation Patient on amiodarone .  Eliquis  was resumed as well.  Chronic kidney disease stage IV Baseline creatinine seems to be around 1.5. Fluctuations in creatinine noted during this admission.  Monitor urine output.  Avoid nephrotoxic agents.  Hypothyroidism Continue levothyroxine .  Essential hypertension Blood pressure is reasonably well-controlled.  History of COPD Likely contributing to her dyspnea and cough.  No wheezing appreciated.  History of depression and anxiety Continue home medications.  DVT Prophylaxis: On apixaban  Code Status: Full code Family Communication: Discussed with patient Disposition Plan: Home when improved.  Home health is recommended.     Medications: Scheduled:  amiodarone   200 mg Oral Once per day on Monday Tuesday Wednesday Thursday Friday Saturday   amLODipine   5 mg Oral Daily   apixaban   2.5 mg Oral BID   budesonide -glycopyrrolate -formoterol   2 puff Inhalation BID   buPROPion   150 mg Oral Daily   doxycycline   100 mg Oral Q12H   levothyroxine   50 mcg Oral QAC breakfast   lidocaine   1 patch Transdermal Q24H   linaclotide   290 mcg Oral QAC breakfast   mirtazapine   7.5 mg Oral Daily   pantoprazole  (PROTONIX ) IV  40 mg Intravenous Q12H   rosuvastatin   20 mg Oral Daily   sertraline   25 mg Oral Daily   traZODone   50 mg Oral QHS   Continuous:  cefTRIAXone  (ROCEPHIN )  IV 1 g (09/10/23 0519)   PRN:acetaminophen  **OR** acetaminophen , albuterol , HYDROmorphone  (DILAUDID ) injection,  ondansetron  **OR** ondansetron  (ZOFRAN ) IV  Antibiotics: Anti-infectives (From admission, onward)    Start     Dose/Rate Route Frequency Ordered Stop   09/08/23 1000  doxycycline  (VIBRA -TABS) tablet 100 mg        100 mg Oral Every 12  hours 09/08/23 0507 09/13/23 0959   09/08/23 0600  cefTRIAXone  (ROCEPHIN ) 1 g in sodium chloride  0.9 % 100 mL IVPB        1 g 200 mL/hr over 30 Minutes Intravenous Every 24 hours 09/08/23 0507 09/13/23 0559       Objective:  Vital Signs  Vitals:   09/09/23 1932 09/09/23 1953 09/10/23 0419 09/10/23 0841  BP:  135/63 131/61   Pulse:  78 69   Resp:  20 20   Temp:  99.5 F (37.5 C) 97.6 F (36.4 C)   TempSrc:  Oral Oral   SpO2: 96% 98% 97% 93%  Weight:      Height:       No intake or output data in the 24 hours ending 09/10/23 0949  Filed Weights   09/05/23 0952  Weight: 59.4 kg    General appearance: Awake alert.  In no distress Resp: Normal effort at rest.  Coarse breath sounds with few crackles at the bases.  No wheezing or rhonchi appreciated. Cardio: S1-S2 is normal regular.  No S3-S4.  No rubs murmurs or bruit GI: Abdomen is soft.  Nontender nondistended.  Bowel sounds are present normal.  No masses organomegaly No obvious focal neurological deficits.   Lab Results:  Data Reviewed: I have personally reviewed following labs and reports of the imaging studies  CBC: Recent Labs  Lab 09/06/23 0450 09/07/23 0429 09/08/23 0015 09/09/23 0419 09/10/23 0630  WBC 5.9 12.5* 14.1* 10.9* 7.4  HGB 8.3* 8.0* 8.9* 8.6* 8.4*  HCT 27.2* 25.7* 28.2* 28.3* 27.4*  MCV 92.5 92.4 92.2 92.8 92.6  PLT PLATELET CLUMPS NOTED ON SMEAR, UNABLE TO ESTIMATE PLATELET CLUMPS NOTED ON SMEAR, COUNT APPEARS DECREASED PLATELET CLUMPS NOTED ON SMEAR, COUNT APPEARS DECREASED PLATELET CLUMPS NOTED ON SMEAR, COUNT APPEARS DECREASED PLATELET CLUMPS NOTED ON SMEAR, COUNT APPEARS DECREASED    Basic Metabolic Panel: Recent Labs  Lab 09/05/23 1012 09/05/23 1046 09/06/23 0450 09/09/23 0419 09/10/23 0630  NA 140  --  136 138 140  K 4.4  --  4.5 5.0 4.6  CL 114*  --  113* 112* 114*  CO2 17*  --  20* 20* 19*  GLUCOSE 99  --  92 86 128*  BUN 34*  --  39* 38* 39*  CREATININE 1.35*  --  1.18*  1.53* 1.45*  CALCIUM  8.7*  --  8.9 8.3* 8.3*  MG  --  1.9  --   --   --     GFR: Estimated Creatinine Clearance: 29 mL/min (A) (by C-G formula based on SCr of 1.45 mg/dL (H)).  Liver Function Tests: Recent Labs  Lab 09/05/23 1046  AST 38  ALT 20  ALKPHOS 41  BILITOT 0.5  PROT 5.7*  ALBUMIN  2.9*     Recent Results (from the past 240 hours)  Resp panel by RT-PCR (RSV, Flu A&B, Covid) Anterior Nasal Swab     Status: None   Collection Time: 09/05/23 10:55 AM   Specimen: Anterior Nasal Swab  Result Value Ref Range Status   SARS Coronavirus 2 by RT PCR NEGATIVE NEGATIVE Final    Comment: (NOTE) SARS-CoV-2 target nucleic acids are NOT DETECTED.  The SARS-CoV-2 RNA is generally detectable in  upper respiratory specimens during the acute phase of infection. The lowest concentration of SARS-CoV-2 viral copies this assay can detect is 138 copies/mL. A negative result does not preclude SARS-Cov-2 infection and should not be used as the sole basis for treatment or other patient management decisions. A negative result may occur with  improper specimen collection/handling, submission of specimen other than nasopharyngeal swab, presence of viral mutation(s) within the areas targeted by this assay, and inadequate number of viral copies(<138 copies/mL). A negative result must be combined with clinical observations, patient history, and epidemiological information. The expected result is Negative.  Fact Sheet for Patients:  BloggerCourse.com  Fact Sheet for Healthcare Providers:  SeriousBroker.it  This test is no t yet approved or cleared by the [place]  FDA and  has been authorized for detection and/or diagnosis of SARS-CoV-2 by FDA under an Emergency Use Authorization (EUA). This EUA will remain  in effect (meaning this test can be used) for the duration of the COVID-19 declaration under Section 564(b)(1) of the Act,  21 U.S.C.section 360bbb-3(b)(1), unless the authorization is terminated  or revoked sooner.       Influenza A by PCR NEGATIVE NEGATIVE Final   Influenza B by PCR NEGATIVE NEGATIVE Final    Comment: (NOTE) The Xpert Xpress SARS-CoV-2/FLU/RSV plus assay is intended as an aid in the diagnosis of influenza from Nasopharyngeal swab specimens and should not be used as a sole basis for treatment. Nasal washings and aspirates are unacceptable for Xpert Xpress SARS-CoV-2/FLU/RSV testing.  Fact Sheet for Patients: BloggerCourse.com  Fact Sheet for Healthcare Providers: SeriousBroker.it  This test is not yet approved or cleared by the [place]  FDA and has been authorized for detection and/or diagnosis of SARS-CoV-2 by FDA under an Emergency Use Authorization (EUA). This EUA will remain in effect (meaning this test can be used) for the duration of the COVID-19 declaration under Section 564(b)(1) of the Act, 21 U.S.C. section 360bbb-3(b)(1), unless the authorization is terminated or revoked.     Resp Syncytial Virus by PCR NEGATIVE NEGATIVE Final    Comment: (NOTE) Fact Sheet for Patients: BloggerCourse.com  Fact Sheet for Healthcare Providers: SeriousBroker.it  This test is not yet approved or cleared by the [place]  FDA and has been authorized for detection and/or diagnosis of SARS-CoV-2 by FDA under an Emergency Use Authorization (EUA). This EUA will remain in effect (meaning this test can be used) for the duration of the COVID-19 declaration under Section 564(b)(1) of the Act, 21 U.S.C. section 360bbb-3(b)(1), unless the authorization is terminated or revoked.  Performed at [facility], [address], [place], [place] [address]   MRSA Next Gen by PCR, Nasal     Status: None   Collection Time: 09/05/23  2:09 PM   Specimen: Nasal Mucosa; Nasal Swab  Result Value Ref  Range Status   MRSA by PCR Next Gen NOT DETECTED NOT DETECTED Final    Comment: (NOTE) The GeneXpert MRSA Assay (FDA approved for NASAL specimens only), is one component of a comprehensive MRSA colonization surveillance program. It is not intended to diagnose MRSA infection nor to guide or monitor treatment for MRSA infections. Test performance is not FDA approved in patients less than 15 years old. Performed at [facility], [address] [place], [place] [address]       Radiology Studies: No results found.      LOS: 2 days   Alecxis Baltzell  [facility] Pager on [email or web address]  09/10/2023, 9:49 AM

## 2023-09-10 NOTE — Plan of Care (Signed)
   Problem: Education: Goal: Knowledge of General Education information will improve Description Including pain rating scale, medication(s)/side effects and non-pharmacologic comfort measures Outcome: Progressing   Problem: Health Behavior/Discharge Planning: Goal: Ability to manage health-related needs will improve Outcome: Progressing

## 2023-09-11 DIAGNOSIS — N184 Chronic kidney disease, stage 4 (severe): Secondary | ICD-10-CM | POA: Diagnosis not present

## 2023-09-11 DIAGNOSIS — J9601 Acute respiratory failure with hypoxia: Secondary | ICD-10-CM | POA: Diagnosis not present

## 2023-09-11 DIAGNOSIS — J189 Pneumonia, unspecified organism: Secondary | ICD-10-CM | POA: Diagnosis not present

## 2023-09-11 DIAGNOSIS — D649 Anemia, unspecified: Secondary | ICD-10-CM | POA: Diagnosis not present

## 2023-09-11 MED ORDER — POLYETHYLENE GLYCOL 3350 17 G PO PACK: 17.0000 g | PACK | Freq: Every day | ORAL | Status: DC | PRN

## 2023-09-11 MED ORDER — GUAIFENESIN ER 600 MG PO TB12
600.0000 mg | ORAL_TABLET | Freq: Two times a day (BID) | ORAL | Status: DC
  Administered 2023-09-11 – 2023-09-12 (×3): 600 mg via ORAL
  Filled 2023-09-11 (×3): qty 1

## 2023-09-11 MED ORDER — SENNOSIDES-DOCUSATE SODIUM 8.6-50 MG PO TABS
2.0000 | ORAL_TABLET | Freq: Two times a day (BID) | ORAL | Status: DC
  Administered 2023-09-11 – 2023-09-12 (×3): 2 via ORAL
  Filled 2023-09-11 (×3): qty 2

## 2023-09-11 MED ORDER — BISACODYL 10 MG RE SUPP: 10.0000 mg | Freq: Every day | RECTAL | Status: DC | PRN

## 2023-09-11 MED ORDER — GUAIFENESIN 100 MG/5ML PO LIQD: 5.0000 mL | ORAL | Status: DC | PRN

## 2023-09-11 NOTE — Evaluation (Addendum)
 Physical Therapy Evaluation Patient Details Name: Patricia Jackson MRN: [id number] DOB: May 26, 1940 Today's Date: 09/11/2023  History of Present Illness  Patricia Jackson is a 83 y.o. female with medical history significant of history of atrial fibrillation on chronic anticoagulation, COPD, hypothyroidism, hypertension, hyperlipidemia, chronic kidney disease, dysphagia and anemia of chronic kidney disease and chronic GI bleed; who presented to the hospital secondary to shortness of breath with activity and increased fatigue.  No chest pain, no fever, there is no reports of hematemesis/hematochezia or melena.  No sick contacts, no hematuria, dysuria, focal weaknesses or any other complaints.     Workup in the ED demonstrating positive fecal occult blood test; hemoglobin 5.8.  Of note patient actively receiving iron  infusions as an outpatient.     2 units PRBCs have been ordered; GI service contacted and [facility] consulted to place patient in the hospital for further evaluation and management.   Clinical Impression  Patient agreeable to and tolerated PT evaluation well. Patient reports at baseline, she uses RW some for mobility but most stabilizes through furniture walking at times, and receives some assist with ADLs as she has good 24/7 support at home and receives visits from a nursing aide every other week she reports. Today, she was received in recliner at start of session and was CGA/supervision during transfers and mobility with RW due to mild unsteadiness and SOB throughout. Mod Independent for bed mobility. Patient demo good SpO2, 97%, on 2.5 Lpm at rest. SpO2 drops to 89% on RA during mobility. With a few minutes of recovery on 1.5 Lpm, SpO2 increases to 96%. Patient left in bed, bed alarm set, call bell within reach, and on 1.5 Lpm. Nursing staff notified. Patient reports feeling near her baseline with mobility despite SOB but patient will benefit from continued skilled physical therapy in Home health  setting in order to address strength, endurance, and balance. Patient does not present with urgent need for acute physical therapy at this time. Patient discharged to care of nursing for ambulation daily as tolerated for length of stay.        If plan is discharge home, recommend the following: A little help with walking and/or transfers;A little help with bathing/dressing/bathroom;Assist for transportation;Assistance with cooking/housework;Help with stairs or ramp for entrance   Can travel by private vehicle        Equipment Recommendations None recommended by PT  Recommendations for Other Services       Functional Status Assessment Patient has had a recent decline in their functional status and demonstrates the ability to make significant improvements in function in a reasonable and predictable amount of time.     Precautions / Restrictions Precautions Precautions: Fall Recall of Precautions/Restrictions: Intact Restrictions Weight Bearing Restrictions Per Provider Order: No      Mobility  Bed Mobility Overal bed mobility: Needs Assistance Bed Mobility: Sit to Supine       Sit to supine: Modified independent (Device/Increase time)   General bed mobility comments: Pt received in recliner at beginning of session    Transfers Overall transfer level: Needs assistance Equipment used: Rolling walker (2 wheels) Transfers: Sit to/from Stand, Bed to chair/wheelchair/BSC Sit to Stand: Modified independent (Device/Increase time), Contact guard assist   Step pivot transfers: Contact guard assist, Modified independent (Device/Increase time)       General transfer comment: STS w/ RW and CGA for safety due to unsteadiness once on feet. Chair>bed w/ CGA only due to unsteadiness    Ambulation/Gait Ambulation/Gait assistance: Contact guard assist,  Modified independent (Device/Increase time) Gait Distance (Feet): 60 Feet Assistive device: Rolling walker (2 wheels) Gait  Pattern/deviations: Step-through pattern, Decreased step length - right, Decreased step length - left, Decreased stride length, Shuffle, Trunk flexed Gait velocity: Dec     General Gait Details: Pt with the above deviations during ambulation with RW and CGA. Pt taking 2 standing rest breaks during due to dec endurance and SOB.  Stairs            Wheelchair Mobility     Tilt Bed    Modified Rankin (Stroke Patients Only)       Balance Overall balance assessment: Needs assistance Sitting-balance support: Feet supported, No upper extremity supported Sitting balance-Leahy Scale: Fair Sitting balance - Comments: fair/good seated EOB   Standing balance support: During functional activity, Reliant on assistive device for balance, Bilateral upper extremity supported Standing balance-Leahy Scale: Fair Standing balance comment: Good/fair w/ RW and CGA         Pertinent Vitals/Pain Pain Assessment Pain Assessment: No/denies pain    Home Living Family/patient expects to be discharged to:: Private residence Living Arrangements: Other relatives Available Help at Discharge: Family;Available 24 hours/day Type of Home: House Home Access: Stairs to enter Entrance Stairs-Rails: None Entrance Stairs-Number of Steps: 3   Home Layout: Two level;Able to live on main level with bedroom/bathroom Home Equipment: Rolling Walker (2 wheels);Shower seat Additional Comments: Patient confirms no change in living situation since last admission    Prior Function Prior Level of Function : Needs assist (Reports no falls in last 6 months)       Physical Assist : ADLs (physical);Mobility (physical) Mobility (physical): Gait;Transfers;Bed mobility;Stairs   Mobility Comments: [place] ambulator, uses walls for support but has been using RW a little more frequently ADLs Comments: Reports independent with ADL, assist w/ iADLs     Extremity/Trunk Assessment   Upper Extremity Assessment Upper  Extremity Assessment: Defer to OT evaluation    Lower Extremity Assessment Lower Extremity Assessment: Generalized weakness;Overall WFL for tasks assessed (Generally weak but adequaate for functional activities)    Cervical / Trunk Assessment Cervical / Trunk Assessment: Kyphotic  Communication   Communication Communication: No apparent difficulties    Cognition Arousal: Alert Behavior During Therapy: WFL for tasks assessed/performed   PT - Cognitive impairments: No apparent impairments       Following commands: Intact       Cueing Cueing Techniques: Verbal cues     General Comments      Exercises     Assessment/Plan    PT Assessment All further PT needs can be met in the next venue of care  PT Problem List Decreased strength;Decreased activity tolerance;Decreased balance;Decreased mobility       PT Treatment Interventions      PT Goals (Current goals can be found in the Care Plan section)  Acute Rehab PT Goals Patient Stated Goal: Return home PT Goal Formulation: With patient Time For Goal Achievement: 09/16/23 Potential to Achieve Goals: Good    Frequency       Co-evaluation               AM-PAC PT "6 Clicks" Mobility  Outcome Measure Help needed turning from your back to your side while in a flat bed without using bedrails?: None Help needed moving from lying on your back to sitting on the side of a flat bed without using bedrails?: None Help needed moving to and from a bed to a chair (including a wheelchair)?: A Little  Help needed standing up from a chair using your arms (e.g., wheelchair or bedside chair)?: None Help needed to walk in hospital room?: A Little Help needed climbing 3-5 steps with a railing? : A Little 6 Click Score: 21    End of Session Equipment Utilized During Treatment: Gait belt Activity Tolerance: Patient tolerated treatment well;Patient limited by fatigue Patient left: in bed;with call bell/phone within reach;with bed  alarm set Nurse Communication: Mobility status PT Visit Diagnosis: Unsteadiness on feet (R26.81);Other abnormalities of gait and mobility (R26.89);Muscle weakness (generalized) (M62.81)    Time: 1478-2956 PT Time Calculation (min) (ACUTE ONLY): 18 min   Charges:   PT Evaluation $PT Eval Low Complexity: 1 Low PT Treatments $Therapeutic Activity: 8-22 mins PT General Charges $$ ACUTE PT VISIT: 1 Visit        10:58 AM, 09/11/23 Marysue Sola, PT, DPT [place] with [facility]

## 2023-09-11 NOTE — Progress Notes (Signed)
 [facility] PROGRESS NOTE   Patricia Jackson QMV:[id number] DOB: 1941/01/16 DOA: 09/05/2023  PCP: Omie Bickers, MD  Brief History: 83 y.o. female with medical history significant of history of atrial fibrillation on chronic anticoagulation, COPD, hypothyroidism, hypertension, hyperlipidemia, chronic kidney disease, dysphagia and anemia of chronic kidney disease and chronic GI bleed; who presented to the hospital secondary to shortness of breath with activity and increased fatigue. Workup in the ED demonstrating positive fecal occult blood test; hemoglobin 5.8.  Of note patient actively receiving iron  infusions as an outpatient.  Patient was hospitalized for further management.  Consultants: Gastroenterology  Procedures: EGD   Subjective/Interval History: Patient continues to have cough.  Breathing has improved in the last 2 to 3 days but not back to normal yet.  Denies any nausea vomiting.  Cough remains dry.  Willing to try Mucinex .  Remains constipated.    Assessment/Plan:  Pneumonia/acute respiratory failure with hypoxia Patient with cough.  Chest x-ray raise concern for pneumonia versus vascular congestion.  Was noted to be febrile at 100.7.  WBC was noted to be elevated. Patient was started on ceftriaxone  and doxycycline .  Remains on oxygen  by nasal cannula. Respiratory status has improved however she is still requiring oxygen .  Chest x-ray was repeated yesterday and shows persistent left lower lobe opacity.  Continue antibiotics for now.  Transition to oral antibiotics tomorrow.  Will add Mucinex .  Continue to mobilize. Has had left-sided chest pain.  EKG without any ischemic changes.  Troponin levels are only mildly elevated and flat trend noted.  Unlikely to be cardiac in etiology.  Most likely secondary to pneumonia.  Acute on chronic anemia Thought to be in the setting of GI bleed.  See below regarding endoscopy.  Received 3 units of PRBC in the hospital.   Hemoglobin is  stable.   Platelet clumps are noted.  Unable to specify a value.  Counts appear decreased.  Last time value available was in November 2024 when it was 113,000.  Dysphagia Seen by gastroenterology.  Underwent EGD with dilatation of the Schatzki's ring.  Seems to be tolerating her diet.  Continue to monitor. Continue PPI.  Paroxysmal atrial fibrillation Patient on amiodarone .  Eliquis  was resumed as well.  Chronic kidney disease stage IV Baseline creatinine seems to be around 1.5. Fluctuations in creatinine noted during this admission.  Stable for the most part.  Monitor urine output.  Avoid nephrotoxic agents. Recheck labs in the morning.  Hypothyroidism Continue levothyroxine .  Essential hypertension Blood pressure is reasonably well-controlled.  History of COPD Likely contributing to her dyspnea and cough.  No wheezing appreciated.  History of depression and anxiety Continue home medications.  DVT Prophylaxis: On apixaban  Code Status: Full code Family Communication: Discussed with patient Disposition Plan: Came from home.  Has become deconditioned in the last few days.  Will request PT OT evaluation.     Medications: Scheduled:  amiodarone   200 mg Oral Once per day on Monday Tuesday Wednesday Thursday Friday Saturday   amLODipine   5 mg Oral Daily   apixaban   2.5 mg Oral BID   budesonide -glycopyrrolate -formoterol   2 puff Inhalation BID   buPROPion   150 mg Oral Daily   doxycycline   100 mg Oral Q12H   guaiFENesin   600 mg Oral BID   levothyroxine   50 mcg Oral QAC breakfast   lidocaine   1 patch Transdermal Q24H   linaclotide   290 mcg Oral QAC breakfast   mirtazapine   7.5 mg Oral Daily   pantoprazole   40  mg Oral BID   rosuvastatin   20 mg Oral Daily   senna-docusate  2 tablet Oral BID   sertraline   25 mg Oral Daily   traZODone   50 mg Oral QHS   Continuous:  cefTRIAXone  (ROCEPHIN )  IV 1 g (09/11/23 0538)   PRN:acetaminophen  **OR** acetaminophen , albuterol , bisacodyl ,  guaiFENesin , HYDROmorphone  (DILAUDID ) injection, ondansetron  **OR** ondansetron  (ZOFRAN ) IV, polyethylene glycol  Antibiotics: Anti-infectives (From admission, onward)    Start     Dose/Rate Route Frequency Ordered Stop   09/08/23 1000  doxycycline  (VIBRA -TABS) tablet 100 mg        100 mg Oral Every 12 hours 09/08/23 0507 09/13/23 0959   09/08/23 0600  cefTRIAXone  (ROCEPHIN ) 1 g in sodium chloride  0.9 % 100 mL IVPB        1 g 200 mL/hr over 30 Minutes Intravenous Every 24 hours 09/08/23 0507 09/13/23 0559       Objective:  Vital Signs  Vitals:   09/10/23 1415 09/10/23 2002 09/10/23 2026 09/11/23 0501  BP: 128/62 138/64  (!) 141/66  Pulse: 70 68  71  Resp: 18 18  20   Temp: 99 F (37.2 C) 97.8 F (36.6 C)  97.9 F (36.6 C)  TempSrc: Oral Oral  Oral  SpO2: 92% 97% 94% 94%  Weight:      Height:       No intake or output data in the 24 hours ending 09/11/23 0910  Filed Weights   09/05/23 0952  Weight: 59.4 kg    General appearance: Awake alert.  In no distress Resp: Normal effort at rest.  Crackles left base.  No wheezing or rhonchi. Cardio: S1-S2 is normal regular.  No S3-S4.  No rubs murmurs or bruit GI: Abdomen is soft.  Nontender nondistended.  Bowel sounds are present normal.  No masses organomegaly No obvious focal neurological deficits.   Lab Results:  Data Reviewed: I have personally reviewed following labs and reports of the imaging studies  CBC: Recent Labs  Lab 09/06/23 0450 09/07/23 0429 09/08/23 0015 09/09/23 0419 09/10/23 0630  WBC 5.9 12.5* 14.1* 10.9* 7.4  HGB 8.3* 8.0* 8.9* 8.6* 8.4*  HCT 27.2* 25.7* 28.2* 28.3* 27.4*  MCV 92.5 92.4 92.2 92.8 92.6  PLT PLATELET CLUMPS NOTED ON SMEAR, UNABLE TO ESTIMATE PLATELET CLUMPS NOTED ON SMEAR, COUNT APPEARS DECREASED PLATELET CLUMPS NOTED ON SMEAR, COUNT APPEARS DECREASED PLATELET CLUMPS NOTED ON SMEAR, COUNT APPEARS DECREASED PLATELET CLUMPS NOTED ON SMEAR, COUNT APPEARS DECREASED    Basic  Metabolic Panel: Recent Labs  Lab 09/05/23 1012 09/05/23 1046 09/06/23 0450 09/09/23 0419 09/10/23 0630  NA 140  --  136 138 140  K 4.4  --  4.5 5.0 4.6  CL 114*  --  113* 112* 114*  CO2 17*  --  20* 20* 19*  GLUCOSE 99  --  92 86 128*  BUN 34*  --  39* 38* 39*  CREATININE 1.35*  --  1.18* 1.53* 1.45*  CALCIUM  8.7*  --  8.9 8.3* 8.3*  MG  --  1.9  --   --   --     GFR: Estimated Creatinine Clearance: 29 mL/min (A) (by C-G formula based on SCr of 1.45 mg/dL (H)).  Liver Function Tests: Recent Labs  Lab 09/05/23 1046  AST 38  ALT 20  ALKPHOS 41  BILITOT 0.5  PROT 5.7*  ALBUMIN  2.9*     Recent Results (from the past 240 hours)  Resp panel by RT-PCR (RSV, Flu A&B, Covid) Anterior Nasal  Swab     Status: None   Collection Time: 09/05/23 10:55 AM   Specimen: Anterior Nasal Swab  Result Value Ref Range Status   SARS Coronavirus 2 by RT PCR NEGATIVE NEGATIVE Final    Comment: (NOTE) SARS-CoV-2 target nucleic acids are NOT DETECTED.  The SARS-CoV-2 RNA is generally detectable in upper respiratory specimens during the acute phase of infection. The lowest concentration of SARS-CoV-2 viral copies this assay can detect is 138 copies/mL. A negative result does not preclude SARS-Cov-2 infection and should not be used as the sole basis for treatment or other patient management decisions. A negative result may occur with  improper specimen collection/handling, submission of specimen other than nasopharyngeal swab, presence of viral mutation(s) within the areas targeted by this assay, and inadequate number of viral copies(<138 copies/mL). A negative result must be combined with clinical observations, patient history, and epidemiological information. The expected result is Negative.  Fact Sheet for Patients:  BloggerCourse.com  Fact Sheet for Healthcare Providers:  SeriousBroker.it  This test is no t yet approved or cleared  by the [place]  FDA and  has been authorized for detection and/or diagnosis of SARS-CoV-2 by FDA under an Emergency Use Authorization (EUA). This EUA will remain  in effect (meaning this test can be used) for the duration of the COVID-19 declaration under Section 564(b)(1) of the Act, 21 U.S.C.section 360bbb-3(b)(1), unless the authorization is terminated  or revoked sooner.       Influenza A by PCR NEGATIVE NEGATIVE Final   Influenza B by PCR NEGATIVE NEGATIVE Final    Comment: (NOTE) The Xpert Xpress SARS-CoV-2/FLU/RSV plus assay is intended as an aid in the diagnosis of influenza from Nasopharyngeal swab specimens and should not be used as a sole basis for treatment. Nasal washings and aspirates are unacceptable for Xpert Xpress SARS-CoV-2/FLU/RSV testing.  Fact Sheet for Patients: BloggerCourse.com  Fact Sheet for Healthcare Providers: SeriousBroker.it  This test is not yet approved or cleared by the [place]  FDA and has been authorized for detection and/or diagnosis of SARS-CoV-2 by FDA under an Emergency Use Authorization (EUA). This EUA will remain in effect (meaning this test can be used) for the duration of the COVID-19 declaration under Section 564(b)(1) of the Act, 21 U.S.C. section 360bbb-3(b)(1), unless the authorization is terminated or revoked.     Resp Syncytial Virus by PCR NEGATIVE NEGATIVE Final    Comment: (NOTE) Fact Sheet for Patients: BloggerCourse.com  Fact Sheet for Healthcare Providers: SeriousBroker.it  This test is not yet approved or cleared by the [place]  FDA and has been authorized for detection and/or diagnosis of SARS-CoV-2 by FDA under an Emergency Use Authorization (EUA). This EUA will remain in effect (meaning this test can be used) for the duration of the COVID-19 declaration under Section 564(b)(1) of the Act, 21  U.S.C. section 360bbb-3(b)(1), unless the authorization is terminated or revoked.  Performed at [facility], [address], [place], [place] [address]   MRSA Next Gen by PCR, Nasal     Status: None   Collection Time: 09/05/23  2:09 PM   Specimen: Nasal Mucosa; Nasal Swab  Result Value Ref Range Status   MRSA by PCR Next Gen NOT DETECTED NOT DETECTED Final    Comment: (NOTE) The GeneXpert MRSA Assay (FDA approved for NASAL specimens only), is one component of a comprehensive MRSA colonization surveillance program. It is not intended to diagnose MRSA infection nor to guide or monitor treatment for MRSA infections. Test performance is not FDA  approved in patients less than 49 years old. Performed at [facility], [address] [place], [place] [address]       Radiology Studies: DG CHEST PORT 1 VIEW Result Date: 09/10/2023 CLINICAL DATA:  [id number] Pneumonia [id number] EXAM: PORTABLE CHEST 1 VIEW COMPARISON:  Chest x-ray 09/08/2023 FINDINGS: The heart and mediastinal contours are unchanged. Atherosclerotic plaque. Hyperinflation of the lungs. Chronic coarsened interstitial markings with no overt pulmonary edema. Left lower lung zone airspace opacity. Blunting of bilateral costophrenic angles no pneumothorax. No acute osseous abnormality. IMPRESSION: 1. Interval worsening of left lower lung zone airspace opacity could represent combination of lung disease atelectasis, and pleural effusion. 2. Aortic Atherosclerosis (ICD10-I70.0) and Emphysema (ICD10-J43.9). Electronically Signed   By: Morgane  Naveau M.D.   On: 09/10/2023 10:40        LOS: 3 days   Patricia Jackson  [facility] Pager on [email or web address]  09/11/2023, 9:10 AM

## 2023-09-12 DIAGNOSIS — D649 Anemia, unspecified: Secondary | ICD-10-CM | POA: Diagnosis not present

## 2023-09-12 DIAGNOSIS — Z7901 Long term (current) use of anticoagulants: Secondary | ICD-10-CM

## 2023-09-12 LAB — BASIC METABOLIC PANEL WITH GFR
Anion gap: 6 (ref 5–15)
BUN: 33 mg/dL — ABNORMAL HIGH (ref 8–23)
CO2: 18 mmol/L — ABNORMAL LOW (ref 22–32)
Calcium: 8.4 mg/dL — ABNORMAL LOW (ref 8.9–10.3)
Chloride: 114 mmol/L — ABNORMAL HIGH (ref 98–111)
Creatinine, Ser: 1.2 mg/dL — ABNORMAL HIGH (ref 0.44–1.00)
GFR, Estimated: 46 mL/min — ABNORMAL LOW (ref >60)
Glucose, Bld: 126 mg/dL — ABNORMAL HIGH (ref 70–99)
Potassium: 4.3 mmol/L (ref 3.5–5.1)
Sodium: 138 mmol/L (ref 135–145)

## 2023-09-12 LAB — CBC
HCT: 28.3 % — ABNORMAL LOW (ref 36.0–46.0)
Hemoglobin: 8.8 g/dL — ABNORMAL LOW (ref 12.0–15.0)
MCH: 28.3 pg (ref 26.0–34.0)
MCHC: 31.1 g/dL (ref 30.0–36.0)
MCV: 91 fL (ref 80.0–100.0)
Platelets: 119 10*3/uL — ABNORMAL LOW (ref 150–400)
RBC: 3.11 MIL/uL — ABNORMAL LOW (ref 3.87–5.11)
RDW: 19.4 % — ABNORMAL HIGH (ref 11.5–15.5)
WBC: 4 10*3/uL (ref 4.0–10.5)
nRBC: 0 % (ref 0.0–0.2)

## 2023-09-12 MED ORDER — DOXYCYCLINE HYCLATE 100 MG PO TABS: 100.0000 mg | ORAL_TABLET | Freq: Two times a day (BID) | ORAL | 0 refills | Status: AC

## 2023-09-12 MED ORDER — PANTOPRAZOLE SODIUM 40 MG PO TBEC: 40.0000 mg | DELAYED_RELEASE_TABLET | Freq: Two times a day (BID) | ORAL | 0 refills | Status: AC

## 2023-09-12 NOTE — TOC Progression Note (Signed)
 Transition of Care [facility]) - Progression Note    Patient Details  Name: Patricia Jackson MRN: [id number] Date of Birth: 1940/05/07  Transition of Care [facility]) CM/SW Contact  Katrinka Parr, [place] Phone Number: 09/12/2023, 11:10 AM  Clinical Narrative:     CSW met with pt to discuss HH. She lives with her son and his spouse. She states she has someone from [facility] come to the home though describes it as a nurse coming to check vitals.She states she will let CSW know if she wants [facility] PT/OT.  She has had [facility] in the past; they can accept referral for [facility] PT/OT.   About 20 minutes later pt called CSW from the hall to come in room. She explained she is agreeable to PT/OT and states she is with her niece during the day so she would need to have HH come to her niece's address. She calls her niece to get address. Niece agreeable to plan and provides address [address] [place] [address]. [facility] updated.     Barriers to Discharge: none  Expected Discharge Plan and Services         Expected Discharge Date: 09/12/23                                     Social Determinants of Health (SDOH) Interventions SDOH Screenings   Food Insecurity: No Food Insecurity (09/05/2023)  Housing: Low Risk  (09/05/2023)  Transportation Needs: No Transportation Needs (09/05/2023)  Utilities: Not At Risk (09/05/2023)  Depression (PHQ2-9): Low Risk  (09/02/2023)  Financial Resource Strain: Low Risk  (11/26/2022)  Social Connections: Moderately Integrated (09/05/2023)  Stress: No Stress Concern Present (11/26/2022)  Tobacco Use: Medium Risk (09/06/2023)  Health Literacy: Adequate Health Literacy (11/26/2022)    Readmission Risk Interventions    06/16/2023   11:02 AM 02/04/2023   10:23 AM 01/21/2023    1:10 PM  Readmission Risk Prevention Plan  Transportation Screening Complete Complete Complete  Medication Review Oceanographer) Complete Complete Complete  PCP or Specialist appointment within 3-5 days  of discharge Not Complete    HRI or Home Care Consult Complete Complete Complete  SW Recovery Care/Counseling Consult Complete Complete Complete  Palliative Care Screening Not Applicable Not Applicable Not Applicable  Skilled Nursing Facility Not Applicable Not Applicable Not Applicable

## 2023-09-12 NOTE — Progress Notes (Signed)
 Mobility Specialist Progress Note:    09/12/23 1005  Mobility  Activity Ambulated with assistance in hallway  Level of Assistance Contact guard assist, steadying assist  Assistive Device Front wheel walker  Distance Ambulated (ft) 35 ft  Range of Motion/Exercises Active;All extremities  Activity Response Tolerated well  Mobility Referral Yes  Mobility visit 1 Mobility  Mobility Specialist Start Time (ACUTE ONLY) 0945  Mobility Specialist Stop Time (ACUTE ONLY) 1005  Mobility Specialist Time Calculation (min) (ACUTE ONLY) 20 min   Pt received in chair, agreeable to mobility. Required CGA to stand and ambulate with RW. Tolerated well, SpO2 98% on RA at rest. SpO2 89-92% on RA during ambulation. Audible SOB after session, SpO2 95% on RA. Returned to chair, all needs met.   Glinda Lapping Mobility Specialist Please contact via Special educational needs teacher or  Rehab office at [phone]

## 2023-09-12 NOTE — Care Management Important Message (Signed)
 Important Message  Patient Details  Name: Patricia Jackson MRN: [id number] Date of Birth: 01-Jul-1940   Important Message Given:  Yes - Medicare IM     Trumaine Wimer L Maggi Hershkowitz 09/12/2023, 11:06 AM

## 2023-09-12 NOTE — Discharge Summary (Signed)
 Physician Discharge Summary  Patricia Jackson QMV:[id number] DOB: January 04, 1941 DOA: 09/05/2023  PCP: Omie Bickers, MD  Admit date: 09/05/2023  Discharge date: 09/12/2023  Admitted From:Home  Disposition:  Home  Recommendations for Outpatient Follow-up:  Follow up with PCP in 1-2 weeks, follow-up CBC in 1 week. Plan to follow-up with GI outpatient Continue on doxycycline  for few more days to complete course of treatment for pneumonia Continue PPI twice daily for 4 weeks as prescribed Continue other home medications as prior  Home Health: Yes with PT/OT  Equipment/Devices: None  Discharge Condition:Stable  CODE STATUS: Full  Diet recommendation: Heart Healthy  Brief/Interim Summary: 83 y.o. female with medical history significant of history of atrial fibrillation on chronic anticoagulation, COPD, hypothyroidism, hypertension, hyperlipidemia, chronic kidney disease, dysphagia and anemia of chronic kidney disease and chronic GI bleed; who presented to the hospital secondary to shortness of breath with activity and increased fatigue. Workup in the ED demonstrating positive fecal occult blood test; hemoglobin 5.8.  Of note patient actively receiving iron  infusions as an outpatient.  Patient was hospitalized for further management.   Patient was admitted for acute blood loss anemia that was symptomatic and required total of 3 units of PRBC while hospitalized.  She underwent EGD with GI with dilation of Schatzki's ring and no other critical findings were noted.  She has been resumed on Eliquis  for her atrial fibrillation with stable hemoglobin levels now noted.  Her respiratory condition has improved back to baseline and she will continue on antibiotics to complete course of treatment.  No other acute events or concerns noted.  She will need close follow-up to ensure that hemoglobin levels remained stable.  Discharge Diagnoses:  Principal Problem:   Symptomatic anemia Active Problems:    Depression with anxiety   CKD stage 3b, GFR 30-44 ml/min (HCC)   Gastroesophageal reflux disease   Melena   Chronic anticoagulation  Principal discharge diagnosis: Symptomatic acute blood loss anemia along with pneumonia and associated acute hypoxemic respiratory failure.  Discharge Instructions  Discharge Instructions     Diet - low sodium heart healthy   Complete by: As directed    Diet - low sodium heart healthy   Complete by: As directed    Discharge instructions   Complete by: As directed    Take medications as prescribed Avoid the use of NSAIDs Outpatient follow-up with the gastroenterology service as instructed Resume the use of Eliquis  on 09/08/2023 Follow-up with PCP in 10 days Continue outpatient follow-up with nephrology service and with hematology service for iron  infusions.   Increase activity slowly   Complete by: As directed       Allergies as of 09/12/2023       Reactions   Povidone-iodine Other (See Comments)   Burning   Sulfa Antibiotics Other (See Comments)   Unknown    Sulfamethoxazole Other (See Comments)   Unknown         Medication List     TAKE these medications    acetaminophen  500 MG tablet Commonly known as: TYLENOL  Take 1,000 mg by mouth every 8 (eight) hours as needed for mild pain or moderate pain.   albuterol  (2.5 MG/3ML) 0.083% nebulizer solution Commonly known as: PROVENTIL  Take 2.5 mg by nebulization every 6 (six) hours as needed for shortness of breath or wheezing.   albuterol  108 (90 Base) MCG/ACT inhaler Commonly known as: VENTOLIN  HFA Inhale 1-2 puffs into the lungs every 6 (six) hours as needed for wheezing or shortness of breath.  amiodarone  200 MG tablet Commonly known as: PACERONE  Take 1 Tablet ( 200 mg ) Daily and none on Sunday What changed:  how much to take how to take this when to take this   amLODipine  5 MG tablet Commonly known as: NORVASC  Take 5 mg by mouth daily.   apixaban  2.5 MG Tabs  tablet Commonly known as: ELIQUIS  Take 1 tablet (2.5 mg total) by mouth 2 (two) times daily.   buPROPion  150 MG 24 hr tablet Commonly known as: WELLBUTRIN  XL Take 150 mg by mouth daily.   doxycycline  100 MG tablet Commonly known as: VIBRA -TABS Take 1 tablet (100 mg total) by mouth every 12 (twelve) hours for 2 days.   levothyroxine  50 MCG tablet Commonly known as: SYNTHROID  Take 50 mcg by mouth daily before breakfast.   lidocaine  5 % Commonly known as: LIDODERM  Place 1 patch onto the skin daily. Remove & Discard patch within 12 hours or as directed by MD   linaclotide  290 MCG Caps capsule Commonly known as: LINZESS  Take 1 capsule (290 mcg total) by mouth daily before breakfast.   mirtazapine  15 MG tablet Commonly known as: REMERON  Take 7.5 mg by mouth at bedtime.   nitroGLYCERIN  0.4 MG SL tablet Commonly known as: NITROSTAT  Place 0.4 mg under the tongue every 5 (five) minutes as needed for chest pain.   pantoprazole  40 MG tablet Commonly known as: Protonix  Take 1 tablet (40 mg total) by mouth 2 (two) times daily. What changed: when to take this   rosuvastatin  20 MG tablet Commonly known as: CRESTOR  Take 20 mg by mouth daily.   traZODone  50 MG tablet Commonly known as: DESYREL  Take 50 mg by mouth at bedtime.   Trelegy Ellipta 200-62.5-25 MCG/ACT Aepb Generic drug: Fluticasone -Umeclidin-Vilant Inhale 1 puff into the lungs daily.   Vitamin D3 50 MCG (2000 UT) Tabs Take 2,000 Units by mouth daily.        Follow-up Information     Omie Bickers, MD. Schedule an appointment as soon as possible for a visit in 10 day(s).   Specialty: Internal Medicine Contact information: [address] [place] [address] [phone]                Allergies  Allergen Reactions   Povidone-Iodine Other (See Comments)    Burning   Sulfa Antibiotics Other (See Comments)    Unknown    Sulfamethoxazole Other (See Comments)    Unknown      Consultations: GI   Procedures/Studies: DG CHEST PORT 1 VIEW Result Date: 09/10/2023 CLINICAL DATA:  [id number] Pneumonia [id number] EXAM: PORTABLE CHEST 1 VIEW COMPARISON:  Chest x-ray 09/08/2023 FINDINGS: The heart and mediastinal contours are unchanged. Atherosclerotic plaque. Hyperinflation of the lungs. Chronic coarsened interstitial markings with no overt pulmonary edema. Left lower lung zone airspace opacity. Blunting of bilateral costophrenic angles no pneumothorax. No acute osseous abnormality. IMPRESSION: 1. Interval worsening of left lower lung zone airspace opacity could represent combination of lung disease atelectasis, and pleural effusion. 2. Aortic Atherosclerosis (ICD10-I70.0) and Emphysema (ICD10-J43.9). Electronically Signed   By: Morgane  Naveau M.D.   On: 09/10/2023 10:40   DG CHEST PORT 1 VIEW Result Date: 09/08/2023 CLINICAL DATA:  Epigastric pain EXAM: PORTABLE CHEST 1 VIEW COMPARISON:  09/07/2023 FINDINGS: Heart size and pulmonary vascularity are normal. Emphysematous changes in the lungs. Diffuse coarse interstitial pattern to the lungs likely representing fibrosis. Peribronchial thickening and bronchiectasis suggesting chronic bronchitis. Possible superimposed airspace disease in the left mid and lower lung,  progressing since prior study. This may indicate superimposed pneumonia or less likely asymmetrical edema. No pleural effusion or pneumothorax. Mediastinal contours appear intact. Calcification of the aorta. Surgical clips in the left axilla. IMPRESSION: 1. Emphysematous changes, fibrosis, and chronic bronchitic changes in the lungs. 2. Suggestion of developing left perihilar airspace disease, possibly superimposed pneumonia. Electronically Signed   By: Boyce Byes M.D.   On: 09/08/2023 00:57   DG Abd 1 View Result Date: 09/08/2023 CLINICAL DATA:  Epigastric pain. EXAM: ABDOMEN - 1 VIEW COMPARISON:  None Available. FINDINGS: Gas and stool throughout the colon. No small or  large bowel distention. No abnormal air-fluid levels. No free intra-abdominal air. No radiopaque stones. Vascular calcifications. Degenerative changes in the spine and hips. Scarring suggested in the lung bases. IMPRESSION: Normal nonobstructive bowel gas pattern. Electronically Signed   By: Boyce Byes M.D.   On: 09/08/2023 00:56   DG Chest 2 View Result Date: 09/07/2023 CLINICAL DATA:  Shortness of breath EXAM: CHEST - 2 VIEW COMPARISON:  Chest x-ray 09/05/2023 FINDINGS: Cardiomediastinal silhouette is within normal limits. There are diffuse interstitial opacities throughout both lungs, increased throughout the left lung. There some minimal patchy opacities in the left costophrenic angle. There is no pleural effusion or pneumothorax. No acute fractures are seen. There are left axillary surgical clips. IMPRESSION: Diffuse interstitial opacities throughout both lungs, increased throughout the left lung. Findings may be due to edema or infection. Electronically Signed   By: Tyron Gallon M.D.   On: 09/07/2023 18:12   DG Chest 2 View Result Date: 09/05/2023 CLINICAL DATA:  SOB EXAM: CHEST - 2 VIEW COMPARISON:  June 15, 2023 FINDINGS: Chronic coarsening of the pulmonary interstitium with flattening of the diaphragms, likely due to underlying emphysema. Patchy airspace opacities in the right mid lung zone and both lung bases. No pleural effusion or pneumothorax. No cardiomegaly. Tortuous aorta with aortic atherosclerosis. No acute fracture or destructive lesions. Multilevel thoracic osteophytosis. Surgical clips in the left axilla. IMPRESSION: Emphysema. Patchy airspace opacities in the right mid and both lower lung zones, which may represent atelectasis or a developing bronchopneumonia, in the correct clinical context. Electronically Signed   By: Rance Burrows M.D.   On: 09/05/2023 12:07     Discharge Exam: Vitals:   09/11/23 2034 09/12/23 0352  BP:  (!) 154/75  Pulse:  71  Resp:  17  Temp:  98 F  (36.7 C)  SpO2: 98% 95%   Vitals:   09/11/23 1250 09/11/23 1932 09/11/23 2034 09/12/23 0352  BP: 139/60 (!) 153/79  (!) 154/75  Pulse: 73 68  71  Resp: 18 17  17   Temp: 97.7 F (36.5 C) 97.6 F (36.4 C)  98 F (36.7 C)  TempSrc: Oral Oral  Oral  SpO2: 94% 99% 98% 95%  Weight:      Height:        General: Pt is alert, awake, not in acute distress Cardiovascular: RRR, S1/S2 +, no rubs, no gallops Respiratory: CTA bilaterally, no wheezing, no rhonchi Abdominal: Soft, NT, ND, bowel sounds + Extremities: no edema, no cyanosis    The results of significant diagnostics from this hospitalization (including imaging, microbiology, ancillary and laboratory) are listed below for reference.     Microbiology: Recent Results (from the past 240 hours)  Resp panel by RT-PCR (RSV, Flu A&B, Covid) Anterior Nasal Swab     Status: None   Collection Time: 09/05/23 10:55 AM   Specimen: Anterior Nasal Swab  Result Value Ref Range  Status   SARS Coronavirus 2 by RT PCR NEGATIVE NEGATIVE Final    Comment: (NOTE) SARS-CoV-2 target nucleic acids are NOT DETECTED.  The SARS-CoV-2 RNA is generally detectable in upper respiratory specimens during the acute phase of infection. The lowest concentration of SARS-CoV-2 viral copies this assay can detect is 138 copies/mL. A negative result does not preclude SARS-Cov-2 infection and should not be used as the sole basis for treatment or other patient management decisions. A negative result may occur with  improper specimen collection/handling, submission of specimen other than nasopharyngeal swab, presence of viral mutation(s) within the areas targeted by this assay, and inadequate number of viral copies(<138 copies/mL). A negative result must be combined with clinical observations, patient history, and epidemiological information. The expected result is Negative.  Fact Sheet for Patients:  BloggerCourse.com  Fact Sheet for  Healthcare Providers:  SeriousBroker.it  This test is no t yet approved or cleared by the [place]  FDA and  has been authorized for detection and/or diagnosis of SARS-CoV-2 by FDA under an Emergency Use Authorization (EUA). This EUA will remain  in effect (meaning this test can be used) for the duration of the COVID-19 declaration under Section 564(b)(1) of the Act, 21 U.S.C.section 360bbb-3(b)(1), unless the authorization is terminated  or revoked sooner.       Influenza A by PCR NEGATIVE NEGATIVE Final   Influenza B by PCR NEGATIVE NEGATIVE Final    Comment: (NOTE) The Xpert Xpress SARS-CoV-2/FLU/RSV plus assay is intended as an aid in the diagnosis of influenza from Nasopharyngeal swab specimens and should not be used as a sole basis for treatment. Nasal washings and aspirates are unacceptable for Xpert Xpress SARS-CoV-2/FLU/RSV testing.  Fact Sheet for Patients: BloggerCourse.com  Fact Sheet for Healthcare Providers: SeriousBroker.it  This test is not yet approved or cleared by the [place]  FDA and has been authorized for detection and/or diagnosis of SARS-CoV-2 by FDA under an Emergency Use Authorization (EUA). This EUA will remain in effect (meaning this test can be used) for the duration of the COVID-19 declaration under Section 564(b)(1) of the Act, 21 U.S.C. section 360bbb-3(b)(1), unless the authorization is terminated or revoked.     Resp Syncytial Virus by PCR NEGATIVE NEGATIVE Final    Comment: (NOTE) Fact Sheet for Patients: BloggerCourse.com  Fact Sheet for Healthcare Providers: SeriousBroker.it  This test is not yet approved or cleared by the [place]  FDA and has been authorized for detection and/or diagnosis of SARS-CoV-2 by FDA under an Emergency Use Authorization (EUA). This EUA will remain in effect (meaning this  test can be used) for the duration of the COVID-19 declaration under Section 564(b)(1) of the Act, 21 U.S.C. section 360bbb-3(b)(1), unless the authorization is terminated or revoked.  Performed at [facility], [address], [place], [place] [address]   MRSA Next Gen by PCR, Nasal     Status: None   Collection Time: 09/05/23  2:09 PM   Specimen: Nasal Mucosa; Nasal Swab  Result Value Ref Range Status   MRSA by PCR Next Gen NOT DETECTED NOT DETECTED Final    Comment: (NOTE) The GeneXpert MRSA Assay (FDA approved for NASAL specimens only), is one component of a comprehensive MRSA colonization surveillance program. It is not intended to diagnose MRSA infection nor to guide or monitor treatment for MRSA infections. Test performance is not FDA approved in patients less than 62 years old. Performed at [facility], [address], [place], [place] [address]      Labs:  BNP (last 3 results) Recent Labs    02/02/23 1955 02/28/23 0909 09/05/23 1046  BNP 442.0* 59.0 171.0*   Basic Metabolic Panel: Recent Labs  Lab 09/06/23 0450 09/09/23 0419 09/10/23 0630 09/12/23 0433  NA 136 138 140 138  K 4.5 5.0 4.6 4.3  CL 113* 112* 114* 114*  CO2 20* 20* 19* 18*  GLUCOSE 92 86 128* 126*  BUN 39* 38* 39* 33*  CREATININE 1.18* 1.53* 1.45* 1.20*  CALCIUM  8.9 8.3* 8.3* 8.4*   Liver Function Tests: No results for input(s): "AST", "ALT", "ALKPHOS", "BILITOT", "PROT", "ALBUMIN " in the last 168 hours. No results for input(s): "LIPASE", "AMYLASE" in the last 168 hours. No results for input(s): "AMMONIA" in the last 168 hours. CBC: Recent Labs  Lab 09/07/23 0429 09/08/23 0015 09/09/23 0419 09/10/23 0630 09/12/23 0433  WBC 12.5* 14.1* 10.9* 7.4 4.0  HGB 8.0* 8.9* 8.6* 8.4* 8.8*  HCT 25.7* 28.2* 28.3* 27.4* 28.3*  MCV 92.4 92.2 92.8 92.6 91.0  PLT PLATELET CLUMPS NOTED ON SMEAR, COUNT APPEARS DECREASED PLATELET CLUMPS NOTED ON SMEAR, COUNT APPEARS DECREASED PLATELET CLUMPS NOTED ON  SMEAR, COUNT APPEARS DECREASED PLATELET CLUMPS NOTED ON SMEAR, COUNT APPEARS DECREASED 119*   Cardiac Enzymes: No results for input(s): "CKTOTAL", "CKMB", "CKMBINDEX", "TROPONINI" in the last 168 hours. BNP: Invalid input(s): "POCBNP" CBG: No results for input(s): "GLUCAP" in the last 168 hours. D-Dimer No results for input(s): "DDIMER" in the last 72 hours. Hgb A1c No results for input(s): "HGBA1C" in the last 72 hours. Lipid Profile No results for input(s): "CHOL", "HDL", "LDLCALC", "TRIG", "CHOLHDL", "LDLDIRECT" in the last 72 hours. Thyroid  function studies No results for input(s): "TSH", "T4TOTAL", "T3FREE", "THYROIDAB" in the last 72 hours.  Invalid input(s): "FREET3" Anemia work up No results for input(s): "VITAMINB12", "FOLATE", "FERRITIN", "TIBC", "IRON ", "RETICCTPCT" in the last 72 hours. Urinalysis    Component Value Date/Time   COLORURINE YELLOW 02/03/2023 0123   APPEARANCEUR CLEAR 02/03/2023 0123   LABSPEC 1.016 02/03/2023 0123   PHURINE 5.0 02/03/2023 0123   GLUCOSEU NEGATIVE 02/03/2023 0123   HGBUR NEGATIVE 02/03/2023 0123   BILIRUBINUR NEGATIVE 02/03/2023 0123   KETONESUR NEGATIVE 02/03/2023 0123   PROTEINUR NEGATIVE 02/03/2023 0123   NITRITE NEGATIVE 02/03/2023 0123   LEUKOCYTESUR MODERATE (A) 02/03/2023 0123   Sepsis Labs Recent Labs  Lab 09/08/23 0015 09/09/23 0419 09/10/23 0630 09/12/23 0433  WBC 14.1* 10.9* 7.4 4.0   Microbiology Recent Results (from the past 240 hours)  Resp panel by RT-PCR (RSV, Flu A&B, Covid) Anterior Nasal Swab     Status: None   Collection Time: 09/05/23 10:55 AM   Specimen: Anterior Nasal Swab  Result Value Ref Range Status   SARS Coronavirus 2 by RT PCR NEGATIVE NEGATIVE Final    Comment: (NOTE) SARS-CoV-2 target nucleic acids are NOT DETECTED.  The SARS-CoV-2 RNA is generally detectable in upper respiratory specimens during the acute phase of infection. The lowest concentration of SARS-CoV-2 viral copies this assay  can detect is 138 copies/mL. A negative result does not preclude SARS-Cov-2 infection and should not be used as the sole basis for treatment or other patient management decisions. A negative result may occur with  improper specimen collection/handling, submission of specimen other than nasopharyngeal swab, presence of viral mutation(s) within the areas targeted by this assay, and inadequate number of viral copies(<138 copies/mL). A negative result must be combined with clinical observations, patient history, and epidemiological information. The expected result is Negative.  Fact Sheet for Patients:  BloggerCourse.com  Fact  Sheet for Healthcare Providers:  SeriousBroker.it  This test is no t yet approved or cleared by the [place]  FDA and  has been authorized for detection and/or diagnosis of SARS-CoV-2 by FDA under an Emergency Use Authorization (EUA). This EUA will remain  in effect (meaning this test can be used) for the duration of the COVID-19 declaration under Section 564(b)(1) of the Act, 21 U.S.C.section 360bbb-3(b)(1), unless the authorization is terminated  or revoked sooner.       Influenza A by PCR NEGATIVE NEGATIVE Final   Influenza B by PCR NEGATIVE NEGATIVE Final    Comment: (NOTE) The Xpert Xpress SARS-CoV-2/FLU/RSV plus assay is intended as an aid in the diagnosis of influenza from Nasopharyngeal swab specimens and should not be used as a sole basis for treatment. Nasal washings and aspirates are unacceptable for Xpert Xpress SARS-CoV-2/FLU/RSV testing.  Fact Sheet for Patients: BloggerCourse.com  Fact Sheet for Healthcare Providers: SeriousBroker.it  This test is not yet approved or cleared by the [place]  FDA and has been authorized for detection and/or diagnosis of SARS-CoV-2 by FDA under an Emergency Use Authorization (EUA). This EUA will  remain in effect (meaning this test can be used) for the duration of the COVID-19 declaration under Section 564(b)(1) of the Act, 21 U.S.C. section 360bbb-3(b)(1), unless the authorization is terminated or revoked.     Resp Syncytial Virus by PCR NEGATIVE NEGATIVE Final    Comment: (NOTE) Fact Sheet for Patients: BloggerCourse.com  Fact Sheet for Healthcare Providers: SeriousBroker.it  This test is not yet approved or cleared by the [place]  FDA and has been authorized for detection and/or diagnosis of SARS-CoV-2 by FDA under an Emergency Use Authorization (EUA). This EUA will remain in effect (meaning this test can be used) for the duration of the COVID-19 declaration under Section 564(b)(1) of the Act, 21 U.S.C. section 360bbb-3(b)(1), unless the authorization is terminated or revoked.  Performed at [facility], [address] [place], [place] [address]   MRSA Next Gen by PCR, Nasal     Status: None   Collection Time: 09/05/23  2:09 PM   Specimen: Nasal Mucosa; Nasal Swab  Result Value Ref Range Status   MRSA by PCR Next Gen NOT DETECTED NOT DETECTED Final    Comment: (NOTE) The GeneXpert MRSA Assay (FDA approved for NASAL specimens only), is one component of a comprehensive MRSA colonization surveillance program. It is not intended to diagnose MRSA infection nor to guide or monitor treatment for MRSA infections. Test performance is not FDA approved in patients less than 20 years old. Performed at [facility], [address] [place], [place] [address]      Time coordinating discharge: 35 minutes  SIGNED:   Cornelius Dill, DO [facility] 09/12/2023, 10:58 AM  If 7PM-7AM, please contact night-coverage [email or web address]

## 2023-09-13 DIAGNOSIS — D500 Iron deficiency anemia secondary to blood loss (chronic): Secondary | ICD-10-CM | POA: Diagnosis not present

## 2023-09-13 DIAGNOSIS — D509 Iron deficiency anemia, unspecified: Secondary | ICD-10-CM | POA: Diagnosis not present

## 2023-09-13 DIAGNOSIS — I10 Essential (primary) hypertension: Secondary | ICD-10-CM | POA: Diagnosis not present

## 2023-09-13 DIAGNOSIS — R29898 Other symptoms and signs involving the musculoskeletal system: Secondary | ICD-10-CM | POA: Diagnosis not present

## 2023-09-13 DIAGNOSIS — F5101 Primary insomnia: Secondary | ICD-10-CM | POA: Diagnosis not present

## 2023-09-13 DIAGNOSIS — Z09 Encounter for follow-up examination after completed treatment for conditions other than malignant neoplasm: Secondary | ICD-10-CM | POA: Diagnosis not present

## 2023-09-14 ENCOUNTER — Encounter (HOSPITAL_COMMUNITY): Payer: Self-pay | Admitting: Hematology

## 2023-09-14 ENCOUNTER — Encounter: Payer: Self-pay | Admitting: Nephrology

## 2023-09-14 ENCOUNTER — Encounter: Payer: Self-pay | Admitting: Internal Medicine

## 2023-09-16 ENCOUNTER — Ambulatory Visit: Admitting: Physician Assistant

## 2023-09-16 DIAGNOSIS — R768 Other specified abnormal immunological findings in serum: Secondary | ICD-10-CM

## 2023-09-16 DIAGNOSIS — Z95820 Peripheral vascular angioplasty status with implants and grafts: Secondary | ICD-10-CM

## 2023-09-16 DIAGNOSIS — I10 Essential (primary) hypertension: Secondary | ICD-10-CM

## 2023-09-16 DIAGNOSIS — I2510 Atherosclerotic heart disease of native coronary artery without angina pectoris: Secondary | ICD-10-CM

## 2023-09-16 DIAGNOSIS — R001 Bradycardia, unspecified: Secondary | ICD-10-CM

## 2023-09-16 DIAGNOSIS — Z8709 Personal history of other diseases of the respiratory system: Secondary | ICD-10-CM

## 2023-09-16 DIAGNOSIS — E785 Hyperlipidemia, unspecified: Secondary | ICD-10-CM

## 2023-09-16 DIAGNOSIS — H353221 Exudative age-related macular degeneration, left eye, with active choroidal neovascularization: Secondary | ICD-10-CM

## 2023-09-16 DIAGNOSIS — I214 Non-ST elevation (NSTEMI) myocardial infarction: Secondary | ICD-10-CM

## 2023-09-16 DIAGNOSIS — I4892 Unspecified atrial flutter: Secondary | ICD-10-CM

## 2023-09-16 DIAGNOSIS — Z7901 Long term (current) use of anticoagulants: Secondary | ICD-10-CM

## 2023-09-16 DIAGNOSIS — M19041 Primary osteoarthritis, right hand: Secondary | ICD-10-CM

## 2023-09-16 DIAGNOSIS — R1319 Other dysphagia: Secondary | ICD-10-CM

## 2023-09-16 DIAGNOSIS — N184 Chronic kidney disease, stage 4 (severe): Secondary | ICD-10-CM

## 2023-09-16 DIAGNOSIS — D508 Other iron deficiency anemias: Secondary | ICD-10-CM

## 2023-09-16 DIAGNOSIS — D696 Thrombocytopenia, unspecified: Secondary | ICD-10-CM

## 2023-09-16 DIAGNOSIS — M810 Age-related osteoporosis without current pathological fracture: Secondary | ICD-10-CM

## 2023-09-16 DIAGNOSIS — I7300 Raynaud's syndrome without gangrene: Secondary | ICD-10-CM

## 2023-09-16 DIAGNOSIS — Z9181 History of falling: Secondary | ICD-10-CM

## 2023-09-19 DIAGNOSIS — I700 Atherosclerosis of aorta: Secondary | ICD-10-CM | POA: Diagnosis not present

## 2023-09-19 DIAGNOSIS — I480 Paroxysmal atrial fibrillation: Secondary | ICD-10-CM | POA: Diagnosis not present

## 2023-09-19 DIAGNOSIS — Z955 Presence of coronary angioplasty implant and graft: Secondary | ICD-10-CM | POA: Diagnosis not present

## 2023-09-19 DIAGNOSIS — N184 Chronic kidney disease, stage 4 (severe): Secondary | ICD-10-CM | POA: Diagnosis not present

## 2023-09-19 DIAGNOSIS — D631 Anemia in chronic kidney disease: Secondary | ICD-10-CM | POA: Diagnosis not present

## 2023-09-19 DIAGNOSIS — Z853 Personal history of malignant neoplasm of breast: Secondary | ICD-10-CM | POA: Diagnosis not present

## 2023-09-19 DIAGNOSIS — R1310 Dysphagia, unspecified: Secondary | ICD-10-CM | POA: Diagnosis not present

## 2023-09-19 DIAGNOSIS — I129 Hypertensive chronic kidney disease with stage 1 through stage 4 chronic kidney disease, or unspecified chronic kidney disease: Secondary | ICD-10-CM | POA: Diagnosis not present

## 2023-09-19 DIAGNOSIS — J439 Emphysema, unspecified: Secondary | ICD-10-CM | POA: Diagnosis not present

## 2023-09-19 DIAGNOSIS — E785 Hyperlipidemia, unspecified: Secondary | ICD-10-CM | POA: Diagnosis not present

## 2023-09-19 DIAGNOSIS — Z7951 Long term (current) use of inhaled steroids: Secondary | ICD-10-CM | POA: Diagnosis not present

## 2023-09-19 DIAGNOSIS — I252 Old myocardial infarction: Secondary | ICD-10-CM | POA: Diagnosis not present

## 2023-09-19 DIAGNOSIS — Z87891 Personal history of nicotine dependence: Secondary | ICD-10-CM | POA: Diagnosis not present

## 2023-09-19 DIAGNOSIS — J9601 Acute respiratory failure with hypoxia: Secondary | ICD-10-CM | POA: Diagnosis not present

## 2023-09-19 DIAGNOSIS — J8410 Pulmonary fibrosis, unspecified: Secondary | ICD-10-CM | POA: Diagnosis not present

## 2023-09-19 DIAGNOSIS — I4892 Unspecified atrial flutter: Secondary | ICD-10-CM | POA: Diagnosis not present

## 2023-09-19 DIAGNOSIS — E039 Hypothyroidism, unspecified: Secondary | ICD-10-CM | POA: Diagnosis not present

## 2023-09-19 DIAGNOSIS — K219 Gastro-esophageal reflux disease without esophagitis: Secondary | ICD-10-CM | POA: Diagnosis not present

## 2023-09-19 DIAGNOSIS — Z7901 Long term (current) use of anticoagulants: Secondary | ICD-10-CM | POA: Diagnosis not present

## 2023-09-19 DIAGNOSIS — I2510 Atherosclerotic heart disease of native coronary artery without angina pectoris: Secondary | ICD-10-CM | POA: Diagnosis not present

## 2023-09-19 DIAGNOSIS — D62 Acute posthemorrhagic anemia: Secondary | ICD-10-CM | POA: Diagnosis not present

## 2023-09-19 DIAGNOSIS — J440 Chronic obstructive pulmonary disease with acute lower respiratory infection: Secondary | ICD-10-CM | POA: Diagnosis not present

## 2023-09-20 ENCOUNTER — Encounter: Attending: Internal Medicine | Admitting: Emergency Medicine

## 2023-09-20 VITALS — BP 135/58 | HR 56 | Temp 97.5°F | Resp 166

## 2023-09-20 DIAGNOSIS — D508 Other iron deficiency anemias: Secondary | ICD-10-CM | POA: Insufficient documentation

## 2023-09-20 DIAGNOSIS — N184 Chronic kidney disease, stage 4 (severe): Secondary | ICD-10-CM | POA: Insufficient documentation

## 2023-09-20 DIAGNOSIS — D631 Anemia in chronic kidney disease: Secondary | ICD-10-CM | POA: Diagnosis not present

## 2023-09-20 DIAGNOSIS — E782 Mixed hyperlipidemia: Secondary | ICD-10-CM | POA: Diagnosis not present

## 2023-09-20 DIAGNOSIS — D509 Iron deficiency anemia, unspecified: Secondary | ICD-10-CM | POA: Diagnosis not present

## 2023-09-20 DIAGNOSIS — I7300 Raynaud's syndrome without gangrene: Secondary | ICD-10-CM | POA: Diagnosis not present

## 2023-09-20 DIAGNOSIS — I10 Essential (primary) hypertension: Secondary | ICD-10-CM | POA: Diagnosis not present

## 2023-09-20 DIAGNOSIS — E559 Vitamin D deficiency, unspecified: Secondary | ICD-10-CM | POA: Diagnosis not present

## 2023-09-20 DIAGNOSIS — K219 Gastro-esophageal reflux disease without esophagitis: Secondary | ICD-10-CM | POA: Diagnosis not present

## 2023-09-20 DIAGNOSIS — D500 Iron deficiency anemia secondary to blood loss (chronic): Secondary | ICD-10-CM | POA: Diagnosis not present

## 2023-09-20 DIAGNOSIS — E039 Hypothyroidism, unspecified: Secondary | ICD-10-CM | POA: Diagnosis not present

## 2023-09-20 DIAGNOSIS — N189 Chronic kidney disease, unspecified: Secondary | ICD-10-CM | POA: Insufficient documentation

## 2023-09-20 DIAGNOSIS — I4891 Unspecified atrial fibrillation: Secondary | ICD-10-CM | POA: Diagnosis not present

## 2023-09-20 DIAGNOSIS — J449 Chronic obstructive pulmonary disease, unspecified: Secondary | ICD-10-CM | POA: Diagnosis not present

## 2023-09-20 DIAGNOSIS — F5101 Primary insomnia: Secondary | ICD-10-CM | POA: Diagnosis not present

## 2023-09-20 MED ORDER — DIPHENHYDRAMINE HCL 25 MG PO CAPS
25.0000 mg | ORAL_CAPSULE | Freq: Once | ORAL | Status: AC
  Administered 2023-09-20: 25 mg via ORAL

## 2023-09-20 MED ORDER — ACETAMINOPHEN 325 MG PO TABS
650.0000 mg | ORAL_TABLET | Freq: Once | ORAL | Status: AC
  Administered 2023-09-20: 650 mg via ORAL

## 2023-09-20 MED ORDER — ACETAMINOPHEN 325 MG PO TABS: 650.0000 mg | ORAL_TABLET | Freq: Once | ORAL | Status: AC

## 2023-09-20 MED ORDER — IRON SUCROSE 20 MG/ML IV SOLN
200.0000 mg | Freq: Once | INTRAVENOUS | Status: AC
  Administered 2023-09-20: 200 mg via INTRAVENOUS

## 2023-09-20 NOTE — Progress Notes (Signed)
 Diagnosis: Iron  Deficiency Anemia  Provider:  Cristi Donalds, MD  Procedure: IV Push  IV Type: Peripheral, IV Location: R Forearm  Venofer  (Iron  Sucrose), Dose: 200 mg  Post Infusion IV Care: Observation period completed and Peripheral IV Discontinued  Discharge: Condition: Good, Destination: Home . AVS Declined  Performed by:  Arlina Benjamin, RN

## 2023-09-21 ENCOUNTER — Emergency Department (HOSPITAL_COMMUNITY)
Admission: EM | Admit: 2023-09-21 | Discharge: 2023-09-21 | Disposition: A | Discharge status: Home / Self Care | Attending: Emergency Medicine | Admitting: Emergency Medicine

## 2023-09-21 ENCOUNTER — Emergency Department (HOSPITAL_COMMUNITY)

## 2023-09-21 ENCOUNTER — Encounter (HOSPITAL_COMMUNITY): Payer: Self-pay

## 2023-09-21 ENCOUNTER — Other Ambulatory Visit: Payer: Self-pay

## 2023-09-21 DIAGNOSIS — W19XXXA Unspecified fall, initial encounter: Secondary | ICD-10-CM

## 2023-09-21 DIAGNOSIS — S5012XA Contusion of left forearm, initial encounter: Secondary | ICD-10-CM | POA: Insufficient documentation

## 2023-09-21 DIAGNOSIS — W06XXXA Fall from bed, initial encounter: Secondary | ICD-10-CM | POA: Insufficient documentation

## 2023-09-21 DIAGNOSIS — S0003XA Contusion of scalp, initial encounter: Secondary | ICD-10-CM | POA: Insufficient documentation

## 2023-09-21 DIAGNOSIS — Z79899 Other long term (current) drug therapy: Secondary | ICD-10-CM | POA: Insufficient documentation

## 2023-09-21 DIAGNOSIS — S0990XA Unspecified injury of head, initial encounter: Secondary | ICD-10-CM | POA: Diagnosis not present

## 2023-09-21 DIAGNOSIS — J449 Chronic obstructive pulmonary disease, unspecified: Secondary | ICD-10-CM | POA: Insufficient documentation

## 2023-09-21 DIAGNOSIS — I10 Essential (primary) hypertension: Secondary | ICD-10-CM | POA: Insufficient documentation

## 2023-09-21 DIAGNOSIS — Z7901 Long term (current) use of anticoagulants: Secondary | ICD-10-CM | POA: Insufficient documentation

## 2023-09-21 DIAGNOSIS — S199XXA Unspecified injury of neck, initial encounter: Secondary | ICD-10-CM | POA: Diagnosis not present

## 2023-09-21 DIAGNOSIS — I2510 Atherosclerotic heart disease of native coronary artery without angina pectoris: Secondary | ICD-10-CM | POA: Insufficient documentation

## 2023-09-21 DIAGNOSIS — M79601 Pain in right arm: Secondary | ICD-10-CM | POA: Diagnosis not present

## 2023-09-21 DIAGNOSIS — S5011XA Contusion of right forearm, initial encounter: Secondary | ICD-10-CM | POA: Diagnosis not present

## 2023-09-21 DIAGNOSIS — M25561 Pain in right knee: Secondary | ICD-10-CM | POA: Diagnosis not present

## 2023-09-21 DIAGNOSIS — M25551 Pain in right hip: Secondary | ICD-10-CM | POA: Diagnosis not present

## 2023-09-21 DIAGNOSIS — S8001XA Contusion of right knee, initial encounter: Secondary | ICD-10-CM | POA: Diagnosis not present

## 2023-09-21 DIAGNOSIS — I6523 Occlusion and stenosis of bilateral carotid arteries: Secondary | ICD-10-CM | POA: Diagnosis not present

## 2023-09-21 MED ORDER — ACETAMINOPHEN 325 MG PO TABS
650.0000 mg | ORAL_TABLET | Freq: Once | ORAL | Status: AC
Start: 2023-09-21 — End: 2023-09-21
  Administered 2023-09-21: 650 mg via ORAL
  Filled 2023-09-21: qty 2

## 2023-09-21 NOTE — ED Triage Notes (Signed)
 Pt arrived via POV from home. Pt reports apprx PTA she rolled over and fell out of bed. Pt endorses pain to left scalp, right arm and right knee. Pt reports she did hit her nightstand. Pt denies LOC, denies N/V. Pt is on a blood thinner.

## 2023-09-21 NOTE — Discharge Instructions (Signed)
 Evaluation of your fall today was overall reassuring.  Please follow-up with your PCP.  If you have any weakness or numbness in your extremities, headache with visual disturbance, or any other concerning symptom please return to the ED for further evaluation.

## 2023-09-21 NOTE — ED Provider Notes (Signed)
 Accepted handoff at shift change from Julie Idol, PA-C. Please see prior provider note for more detail.   Briefly: Patient is 83 y.o. presenting for fall.  Is on Eliquis .  DDX: concern for traumatic head, neck, cervical spine, knee injury  Plan: Scans are pending.  If remaining workup reassuring likely discharge.   Physical Exam  BP (!) 153/72   Pulse 60   Temp 98 F (36.7 C) (Oral)   Resp 18   Ht 5' 7 (1.702 m)   Wt 59.4 kg   SpO2 99%   BMI 20.52 kg/m   Physical Exam  Procedures  Procedures  ED Course / MDM   Clinical Course as of 09/21/23 2037  Wed Sep 21, 2023  1841 Rolled out of bed this afternoon. On eliquis . Left temporal hematoma. No LOC. Bruise on right forearm. Bruise on right knee. If scans are ok, can discharge with pcp follow up.  [JR]    Clinical Course User Index [JR] Janalee Mcmurray, PA-C   Medical Decision Making Amount and/or Complexity of Data Reviewed Radiology: ordered.  Risk OTC drugs.   CT scans and x-ray are negative for acute findings.  Advised patient of the results.  On reassessment patient was hemodynamically stable, no acute distress and well-appearing.  Advised her to follow-up PCP.  Discussed return precautions.  Discharged.        Janalee Mcmurray, PA-C 09/21/23 2041    Cheyenne Cotta, MD 09/23/23 [phone]

## 2023-09-21 NOTE — Progress Notes (Unsigned)
 Office Visit Note  Patient: Patricia Jackson             Date of Birth: October 23, 1940           MRN: [id number]             PCP: Shona Norleen PEDLAR, MD Referring: Shona Norleen PEDLAR, MD Visit Date: 10/05/2023 Occupation: @GUAROCC @  Subjective:  Routine follow-up  History of Present Illness: Patricia Jackson is a 83 y.o. female with history of positive ANA, osteoarthritis, and osteoporosis.  Patient was last seen in the office on 10/01/2022 by Dr. Dolphus.  Since her last office visit she has had several hospitalizations due to recurrent falls, atrial fibrillation, GI bleeds, and symptomatic anemia.  Her most recent fall prompted an ED visit on 09/21/2023.  Patient continues to have some soreness involving multiple joints due to the fall.  She has some soreness of both knees but denies any joint swelling.  She continues to experience intermittent symptoms of Raynaud's phenomenon but denies any increase skin tightness or thickening.  She denies any digital ulcerations.  She continues to have ongoing mouth dryness which she attributes to the medications she takes.  She denies any oral or nasal ulcerations.  She denies any recent rashes.   She remains on Eliquis  as prescribed.    Activities of Daily Living:  Patient reports morning stiffness for 0 minute.   Patient Denies nocturnal pain.  Difficulty dressing/grooming: Reports Difficulty climbing stairs: Reports Difficulty getting out of chair: Reports Difficulty using hands for taps, buttons, cutlery, and/or writing: Reports  Review of Systems  Constitutional:  Negative for fatigue.  HENT:  Positive for mouth dryness. Negative for mouth sores.   Eyes:  Negative for dryness.  Respiratory:  Positive for shortness of breath.   Cardiovascular:  Negative for chest pain and palpitations.  Gastrointestinal:  Positive for constipation. Negative for blood in stool and diarrhea.  Endocrine: Positive for increased urination.  Genitourinary:  Positive for  involuntary urination.  Musculoskeletal:  Positive for joint pain, gait problem, joint pain, joint swelling, myalgias, muscle weakness, muscle tenderness and myalgias. Negative for morning stiffness.  Skin:  Negative for color change, rash, hair loss and sensitivity to sunlight.  Allergic/Immunologic: Negative for susceptible to infections.  Neurological:  Positive for dizziness. Negative for headaches.  Hematological:  Negative for swollen glands.  Psychiatric/Behavioral:  Positive for sleep disturbance. Negative for depressed mood. The patient is not nervous/anxious.     PMFS History:  Patient Active Problem List   Diagnosis Date Noted   Chronic anticoagulation 09/12/2023   Melena 09/06/2023   Symptomatic anemia 09/05/2023   Anemia of chronic renal failure 08/22/2023   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   Severe anemia 06/16/2023   Acute blood loss anemia 06/15/2023   Elevated brain natriuretic peptide (BNP) level 02/03/2023   Paroxysmal atrial fibrillation with RVR (HCC) 02/02/2023   Malnutrition of moderate degree 01/24/2023   Atrial fibrillation with RVR (HCC) 01/20/2023   On anticoagulant therapy 11/21/2022   ABLA (acute blood loss anemia) 11/21/2022   Raynaud's syndrome 11/20/2022   Osteoarthritis 11/20/2022   Generalized anxiety disorder 11/18/2022   Positive antinuclear antibody 11/02/2022   Insomnia 11/02/2022   Gastroesophageal reflux disease 11/01/2022   Abrasion of lower back 10/11/2022   Atrial fibrillation (HCC) 09/23/2022   Vitamin D  deficiency 09/23/2022   Syncope 09/13/2022   CAP (community acquired pneumonia) 09/12/2022   sepsis from pneumonia 07/26/2022   intermittent confusion 07/26/2022   Hyponatremia 07/26/2022  Preoperative evaluation of a medical condition to rule out surgical contraindications (TAR required) 05/14/2022   Fracture of distal end of radius 05/02/2022   Left wrist pain 04/28/2022   Osteoporosis 01/19/2022   DOE (dyspnea  on exertion)    Acidosis, unspecified 06/20/2021   Elevated troponin 06/19/2021   Iron  deficiency anemia 06/12/2021   Acquired hypothyroidism 05/15/2021   CKD stage 3b, GFR 30-44 ml/min (HCC) 05/15/2021   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   Essential hypertension 12/10/2020   Retinal hemorrhage, right eye 11/04/2020   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   Traction detachment of left retina 08/03/2019   Left retinal detachment 08/01/2019   Age-related macular degeneration 08/01/2019   Retinal hemorrhage of left eye 08/01/2019   Advanced nonexudative age-related macular degeneration of left eye with subfoveal involvement 08/01/2019   Retinal detachment 07/26/2019   Choroidal detachment of right eye 07/26/2019   Exudative retinopathy of left eye 07/26/2019   Thrombocytopenic disorder (HCC) 07/04/2019   Orthostatic syncope 08/27/2017   Atypical chest pain 08/27/2017   Atrial flutter (HCC) 10/28/2016   Chronic kidney disease, stage 4 (severe) (HCC) 10/28/2016   Chronic obstructive pulmonary disease (HCC) 10/28/2016   Depression with anxiety 10/28/2016   Acute kidney injury superimposed on stage 4 chronic kidney disease (HCC) 09/08/2016   Hyperglycemia 09/08/2016   Esophageal dysphagia    Anemia 04/07/2016   CAD in native artery 03/16/2016   S/P angioplasty with stent 03/15/16 DES Resolute, pLCX 03/16/2016   NSTEMI (non-ST elevated myocardial infarction) (HCC) 03/12/2016   Dyspnea on exertion    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HCC) 10/31/2015   Hyperlipidemia 10/31/2015   Atrial flutter with rapid ventricular response (HCC) 10/30/2015   Back pain 11/26/2013   Arm pain 11/26/2013   Chest pain 11/26/2013    Past Medical History:  Diagnosis Date   A-fib [facility])    Atrial flutter (HCC)    On Eliquis  in 8/17 but discontinued after stent placement   Breast cancer (HCC)    remote   CAD in native artery 03/16/2016   COPD (chronic obstructive pulmonary disease) (HCC)    Dysrhythmia     Hypertension    Iron  deficiency anemia 06/12/2021   NSTEMI (non-ST elevated myocardial infarction) (HCC)    2017   S/P angioplasty with stent 03/15/16 DES Resolute, pLCX 03/16/2016   Vitreous hemorrhage of left eye (HCC) 08/01/2019    Family History  Problem Relation Age of Onset   Heart disease Mother    COPD Father    Cancer Sister        unknown primary   Cancer Brother        unknown primary   Cancer Brother    Hypertension Son    Colon cancer Neg Hx    Past Surgical History:  Procedure Laterality Date   APPENDECTOMY     BREAST SURGERY Left    CARDIAC CATHETERIZATION N/A 03/15/2016   Procedure: Left Heart Cath and Coronary Angiography;  Surgeon: Victory LELON Sharps, MD;  Location: [facility] INVASIVE CV LAB;  Service: Cardiovascular;  Laterality: N/A;   CARDIAC CATHETERIZATION N/A 03/15/2016   Procedure: Coronary Stent Intervention;  Surgeon: Victory LELON Sharps, MD;  Location: [facility] INVASIVE CV LAB;  Service: Cardiovascular;  Laterality: N/A;   CARDIOVERSION N/A 02/04/2023   Procedure: CARDIOVERSION;  Surgeon: Stacia Diannah SQUIBB, MD;  Location: [facility] ORS;  Service: Cardiovascular;  Laterality: N/A;   COLONOSCOPY     remote   COLONOSCOPY N/A 05/26/2016   Procedure: COLONOSCOPY;  Surgeon: Lamar HERO  Rourk, MD;  Location: [facility] END[facility] SU[facility];  Service: Endoscopy;  Laterality: N/A;  845   COLONOSCOPY N/A 06/17/2023   Procedure: COLONOSCOPY;  Surgeon: Shaaron Lamar HERO, MD;  Location: [facility];  Service: Endoscopy;  Laterality: N/A;   ENTEROSCOPY N/A 09/06/2023   Procedure: ENTEROSCOPY;  Surgeon: Eartha Angelia Sieving, MD;  Location: [facility];  Service: Gastroenterology;  Laterality: N/A;   ESOPHAGOGASTRODUODENOSCOPY N/A 05/26/2016   Procedure: ESOPHAGOGASTRODUODENOSCOPY (EGD);  Surgeon: Lamar HERO Shaaron, MD;  Location: [facility];  Service: Endoscopy;  Laterality: N/A;   ESOPHAGOGASTRODUODENOSCOPY N/A 06/16/2023   Procedure: EGD (ESOPHAGOGASTRODUODENOSCOPY);  Surgeon: Cinderella Deatrice FALCON, MD;   Location: [facility];  Service: Endoscopy;  Laterality: N/A;   ESOPHAGOGASTRODUODENOSCOPY N/A 09/06/2023   Procedure: EGD (ESOPHAGOGASTRODUODENOSCOPY);  Surgeon: Eartha Angelia, Sieving, MD;  Location: [facility];  Service: Gastroenterology;  Laterality: N/A;  with possible dilation   ESOPHAGOGASTRODUODENOSCOPY (EGD) WITH PROPOFOL  N/A 11/22/2022   Procedure: ESOPHAGOGASTRODUODENOSCOPY (EGD) WITH PROPOFOL ;  Surgeon: Cindie Carlin POUR, DO;  Location: [facility];  Service: Endoscopy;  Laterality: N/A;   EYE SURGERY     EYE SURGERY Left    Dr. Loretha   HEMOSTASIS CLIP PLACEMENT  06/17/2023   Procedure: CONTROL OF HEMORRHAGE, GI TRACT, ENDOSCOPIC, BY CLIPPING OR OVERSEWING;  Surgeon: Shaaron Lamar HERO, MD;  Location: [facility];  Service: Endoscopy;;   HOT HEMOSTASIS  11/22/2022   Procedure: HOT HEMOSTASIS (ARGON PLASMA COAGULATION/BICAP);  Surgeon: Cindie Carlin POUR, DO;  Location: [facility];  Service: Endoscopy;;   HOT HEMOSTASIS  06/16/2023   Procedure: EGD, WITH ARGON PLASMA COAGULATION;  Surgeon: Cinderella Deatrice FALCON, MD;  Location: [facility];  Service: Endoscopy;;   MALONEY DILATION N/A 05/26/2016   Procedure: AGAPITO DILATION;  Surgeon: Lamar HERO Shaaron, MD;  Location: [facility];  Service: Endoscopy;  Laterality: N/A;   POLYPECTOMY  06/17/2023   Procedure: POLYPECTOMY;  Surgeon: Shaaron Lamar HERO, MD;  Location: [facility];  Service: Endoscopy;;   SCLEROTHERAPY  06/17/2023   Procedure: MATIAS;  Surgeon: Shaaron Lamar HERO, MD;  Location: [facility];  Service: Endoscopy;;   TEE WITHOUT CARDIOVERSION N/A 02/04/2023   Procedure: TRANSESOPHAGEAL ECHOCARDIOGRAM (TEE);  Surgeon: Mallipeddi, Vishnu P, MD;  Location: [facility];  Service: Cardiovascular;  Laterality: N/A;   TOOTH EXTRACTION N/A 01/15/2022   Procedure: DENTAL RESTORATION/EXTRACTIONS;  Surgeon: Sheryle Hamilton, DMD;  Location: [facility] OR;  Service: Oral Surgery;  Laterality: N/A;   VITRECTOMY Left 10/03/2019   Dr. Elner,  Vitrectomy, Focal Laser, Removal of Silicone Oil   Social History   Social History Narrative   ** Merged History Encounter **       Immunization History  Administered Date(s) Administered   Fluad Trivalent(High Dose 65+) 01/23/2023   Influenza,inj,Quad PF,6+ Mos 01/27/2016   Moderna Sars-Covid-2 Vaccination 06/15/2019, 07/16/2019   PNEUMOCOCCAL CONJUGATE-20 08/13/2022, 06/18/2023   Tdap 09/04/2019     Objective: Vital Signs: BP 135/73 (BP Location: Right Arm, Patient Position: Sitting, Cuff Size: Normal)   Pulse 60   Resp 14   Ht 5' 7 (1.702 m)   Wt 142 lb (64.4 kg)   BMI 22.24 kg/m    Physical Exam Vitals and nursing note reviewed.  Constitutional:      Appearance: She is well-developed.  HENT:     Head: Normocephalic and atraumatic.  Eyes:     Conjunctiva/sclera: Conjunctivae normal.  Cardiovascular:     Rate and Rhythm: Normal rate and regular rhythm.  Heart sounds: Normal heart sounds.  Pulmonary:     Effort: Pulmonary effort is normal.     Breath sounds: Normal breath sounds.  Abdominal:     General: Bowel sounds are normal.     Palpations: Abdomen is soft.  Musculoskeletal:     Cervical back: Normal range of motion.  Lymphadenopathy:     Cervical: No cervical adenopathy.  Skin:    General: Skin is warm and dry.     Capillary Refill: Capillary refill takes less than 2 seconds.     Comments: No signs of sclerodactyly.  No digital ulcerations.  Neurological:     Mental Status: She is alert and oriented to person, place, and time.  Psychiatric:        Behavior: Behavior normal.      Musculoskeletal Exam: C-spine has good range of motion.  Thoracic kyphosis noted.  Shoulder joints have good range of motion.  Some tenderness of the right elbow.  Left wrist thickening and subluxation noted.  No tenderness or synovitis over MCP joints.  Hip joints have slightly limited range of motion.  Knee joints have good range of motion with no warmth or effusion.  Some  soreness of both knees noted.  Ankle joints have good range of motion with no tenderness.  CDAI Exam: CDAI Score: -- Patient Global: --; Provider Global: -- Swollen: --; Tender: -- Joint Exam 10/05/2023   No joint exam has been documented for this visit   There is currently no information documented on the homunculus. Go to the Rheumatology activity and complete the homunculus joint exam.  Investigation: No additional findings.  Imaging: DG Knee Complete 4 Views Right Result Date: 09/21/2023 EXAM: 4 VIEW(S) XRAY OF THE RIGHT KNEE 09/21/2023 06:35:00 PM COMPARISON: None available. CLINICAL HISTORY: Fall. Pt arrived via POV from home. Pt reports apprx PTA she rolled over and fell out of bed. Pt endorses pain to left scalp, right arm and right knee. Pt reports she did hit her nightstand. Pt denies LOC, denies N/V. Pt is on a blood thinner. FINDINGS: BONES AND JOINTS: No acute fracture. No joint dislocation. No significant joint effusion. No significant degenerative changes. Benign-appearing patellar exostosis anteriorly. SOFT TISSUES: The soft tissues are unremarkable. Vascular calcifications. IMPRESSION: 1. No acute fracture or dislocation. Electronically signed by: Pinkie Pebbles MD 09/21/2023 07:18 PM EDT RP Workstation: [id number]   CT Head Wo Contrast Result Date: 09/21/2023 CLINICAL DATA:  Head trauma, minor (Age >= 65y); Neck trauma (Age >= 65y) Pt reports apprx PTA she rolled over and fell out of bed. Pt endorses pain to left scalp, right arm and right knee. Pt reports she did hit her nightstand. Pt denies LOC, denies N/V. Pt is on a blood thinner. EXAM: CT HEAD WITHOUT CONTRAST CT CERVICAL SPINE WITHOUT CONTRAST TECHNIQUE: Multidetector CT imaging of the head and cervical spine was performed following the standard protocol without intravenous contrast. Multiplanar CT image reconstructions of the cervical spine were also generated. RADIATION DOSE REDUCTION: This exam was  performed according to the departmental dose-optimization program which includes automated exposure control, adjustment of the mA and/or kV according to patient size and/or use of iterative reconstruction technique. COMPARISON:  CT head and C-spine 04/26/2022. FINDINGS: CT HEAD FINDINGS Brain: Atherosclerotic calcifications are present within the cavernous internal carotid arteries. No evidence of large-territorial acute infarction. No parenchymal hemorrhage. No mass lesion. No extra-axial collection. No mass effect or midline shift. No hydrocephalus. Basilar cisterns are patent. Vascular: No hyperdense vessel. Skull: No  acute fracture or focal lesion. Sinuses/Orbits: Paranasal sinuses and mastoid air cells are clear. Bilateral lens replacement. Otherwise the orbits are unremarkable. Other: 14mm left frontal scalp hematoma. CT CERVICAL SPINE FINDINGS Alignment: Grade 1 anterolisthesis C3 on C4 and C4 on C5. Skull base and vertebrae: Moderate to severe osseous neuroforaminal stenosis of the C3-C4 level. No acute fracture. No aggressive appearing focal osseous lesion or focal pathologic process. Soft tissues and spinal canal: No prevertebral fluid or swelling. No visible canal hematoma. Upper chest: Unremarkable. Other: Atherosclerotic plaque of the carotid arteries within the neck. IMPRESSION: 1. No acute intracranial abnormality. 2. No acute displaced fracture or traumatic listhesis of the cervical spine. Electronically Signed   By: Morgane  Naveau M.D.   On: 09/21/2023 18:57   CT Cervical Spine Wo Contrast Result Date: 09/21/2023 CLINICAL DATA:  Head trauma, minor (Age >= 65y); Neck trauma (Age >= 65y) Pt reports apprx PTA she rolled over and fell out of bed. Pt endorses pain to left scalp, right arm and right knee. Pt reports she did hit her nightstand. Pt denies LOC, denies N/V. Pt is on a blood thinner. EXAM: CT HEAD WITHOUT CONTRAST CT CERVICAL SPINE WITHOUT CONTRAST TECHNIQUE: Multidetector CT  imaging of the head and cervical spine was performed following the standard protocol without intravenous contrast. Multiplanar CT image reconstructions of the cervical spine were also generated. RADIATION DOSE REDUCTION: This exam was performed according to the departmental dose-optimization program which includes automated exposure control, adjustment of the mA and/or kV according to patient size and/or use of iterative reconstruction technique. COMPARISON:  CT head and C-spine 04/26/2022. FINDINGS: CT HEAD FINDINGS Brain: Atherosclerotic calcifications are present within the cavernous internal carotid arteries. No evidence of large-territorial acute infarction. No parenchymal hemorrhage. No mass lesion. No extra-axial collection. No mass effect or midline shift. No hydrocephalus. Basilar cisterns are patent. Vascular: No hyperdense vessel. Skull: No acute fracture or focal lesion. Sinuses/Orbits: Paranasal sinuses and mastoid air cells are clear. Bilateral lens replacement. Otherwise the orbits are unremarkable. Other: 14mm left frontal scalp hematoma. CT CERVICAL SPINE FINDINGS Alignment: Grade 1 anterolisthesis C3 on C4 and C4 on C5. Skull base and vertebrae: Moderate to severe osseous neuroforaminal stenosis of the C3-C4 level. No acute fracture. No aggressive appearing focal osseous lesion or focal pathologic process. Soft tissues and spinal canal: No prevertebral fluid or swelling. No visible canal hematoma. Upper chest: Unremarkable. Other: Atherosclerotic plaque of the carotid arteries within the neck. IMPRESSION: 1. No acute intracranial abnormality. 2. No acute displaced fracture or traumatic listhesis of the cervical spine. Electronically Signed   By: Morgane  Naveau M.D.   On: 09/21/2023 18:57   DG CHEST PORT 1 VIEW Result Date: 09/10/2023 CLINICAL DATA:  [id number] Pneumonia [id number] EXAM: PORTABLE CHEST 1 VIEW COMPARISON:  Chest x-ray 09/08/2023 FINDINGS: The heart and mediastinal contours are unchanged.  Atherosclerotic plaque. Hyperinflation of the lungs. Chronic coarsened interstitial markings with no overt pulmonary edema. Left lower lung zone airspace opacity. Blunting of bilateral costophrenic angles no pneumothorax. No acute osseous abnormality. IMPRESSION: 1. Interval worsening of left lower lung zone airspace opacity could represent combination of lung disease atelectasis, and pleural effusion. 2. Aortic Atherosclerosis (ICD10-I70.0) and Emphysema (ICD10-J43.9). Electronically Signed   By: Morgane  Naveau M.D.   On: 09/10/2023 10:40   DG CHEST PORT 1 VIEW Result Date: 09/08/2023 CLINICAL DATA:  Epigastric pain EXAM: PORTABLE CHEST 1 VIEW COMPARISON:  09/07/2023 FINDINGS: Heart size and pulmonary vascularity are normal. Emphysematous changes in the lungs. Diffuse  coarse interstitial pattern to the lungs likely representing fibrosis. Peribronchial thickening and bronchiectasis suggesting chronic bronchitis. Possible superimposed airspace disease in the left mid and lower lung, progressing since prior study. This may indicate superimposed pneumonia or less likely asymmetrical edema. No pleural effusion or pneumothorax. Mediastinal contours appear intact. Calcification of the aorta. Surgical clips in the left axilla. IMPRESSION: 1. Emphysematous changes, fibrosis, and chronic bronchitic changes in the lungs. 2. Suggestion of developing left perihilar airspace disease, possibly superimposed pneumonia. Electronically Signed   By: Elsie Gravely M.D.   On: 09/08/2023 00:57   DG Abd 1 View Result Date: 09/08/2023 CLINICAL DATA:  Epigastric pain. EXAM: ABDOMEN - 1 VIEW COMPARISON:  None Available. FINDINGS: Gas and stool throughout the colon. No small or large bowel distention. No abnormal air-fluid levels. No free intra-abdominal air. No radiopaque stones. Vascular calcifications. Degenerative changes in the spine and hips. Scarring suggested in the lung bases. IMPRESSION: Normal nonobstructive bowel gas  pattern. Electronically Signed   By: Elsie Gravely M.D.   On: 09/08/2023 00:56   DG Chest 2 View Result Date: 09/07/2023 CLINICAL DATA:  Shortness of breath EXAM: CHEST - 2 VIEW COMPARISON:  Chest x-ray 09/05/2023 FINDINGS: Cardiomediastinal silhouette is within normal limits. There are diffuse interstitial opacities throughout both lungs, increased throughout the left lung. There some minimal patchy opacities in the left costophrenic angle. There is no pleural effusion or pneumothorax. No acute fractures are seen. There are left axillary surgical clips. IMPRESSION: Diffuse interstitial opacities throughout both lungs, increased throughout the left lung. Findings may be due to edema or infection. Electronically Signed   By: Greig Pique M.D.   On: 09/07/2023 18:12    Recent Labs: Lab Results  Component Value Date   WBC 4.0 09/12/2023   HGB 8.8 (L) 09/12/2023   PLT 119 (L) 09/12/2023   NA 138 09/12/2023   K 4.3 09/12/2023   CL 114 (H) 09/12/2023   CO2 18 (L) 09/12/2023   GLUCOSE 126 (H) 09/12/2023   BUN 33 (H) 09/12/2023   CREATININE 1.20 (H) 09/12/2023   BILITOT 0.5 09/05/2023   ALKPHOS 41 09/05/2023   AST 38 09/05/2023   ALT 20 09/05/2023   PROT 5.7 (L) 09/05/2023   ALBUMIN  2.9 (L) 09/05/2023   CALCIUM  8.4 (L) 09/12/2023   GFRAA 38 (L) 07/06/2019    Speciality Comments: No specialty comments available.  Procedures:  No procedures performed Allergies: Povidone-iodine, Sulfa antibiotics, and Sulfamethoxazole   Assessment / Plan:     Visit Diagnoses: Positive ANA (antinuclear antibody) - ANA 1: 1280NH, aPS IgM >150, thrombocytopenia, history of Raynauds and sicca symptoms: The patient's ANA and phosphatidylserine IgM have remained positive.  She remains on Eliquis  as prescribed. She has had several hospitalizations since her last office visit due to recurrent falls, symptomatic anemia, A-fib, and GI bleeds. She has not noticed any new or worsening symptoms since her last office  visit on 10/01/2022.  She continues to have chronic mouth dryness which she attributes to the medications that she takes.  Discussed the use of over-the-counter products including Biotene and XyliMelts.  She has not had any oral or nasal ulcerations.  No recent rashes.  No photosensitivity. She has intermittent symptoms of Raynaud's phenomenon.  No signs of sclerodactyly.  No digital ulcerations. Lab work from 10/01/2022 was reviewed today in the office: Complements within normal limits, double-stranded DNA negative, phosphatidylserine IgM >150, ANA 1:1280NH.  Plan to update the following lab work today for further evaluation.  She was  advised to notify us  if she develops any new or worsening symptoms.  She will follow-up in the office in 1 year or sooner if needed.  - Plan: ANA, Sedimentation rate, C3 and C4, Anti-DNA antibody, double-stranded  Raynaud's syndrome without gangrene: She has intermittent symptoms of Raynaud's, none.  No signs of sclerodactyly noted.  No digital ulcerations noted.  Discussed the importance of keeping her core body temperature warm.  Primary osteoarthritis of both hands: She has PIP and DIP thickening consistent with osteoarthritis of both hands.  The left wrist has limited extension, thickening, and subluxation from a previous fracture.  Other medical conditions are listed as follows:  History of COPD - Previously followed by Dr. Theophilus. HRCT 2018 was negative for interstitial lung disease.  Age-related osteoporosis without current pathological fracture - Followed by Dr. Shona. DEXA 06/29/21: The BMD measured at Femur Neck Left is 0.668 g/cm2 with a T-score of -2.7. She remains on Prolia  60 mg subcutaneous injections every 6 months ordered by her PCP.  Thrombocytopenia (HCC) -Platelet count was 119K on 09/12/2023.  Essential hypertension: Blood pressure was 135/73 today in the office.  CAD in native artery  NSTEMI (non-ST elevated myocardial infarction) (HCC)  Sinus  bradycardia  S/P angioplasty with stent 03/15/16 DES Resolute, pLCX  Dyslipidemia  Atrial flutter with rapid ventricular response (HCC)  Exudative age-related macular degeneration of left eye with active choroidal neovascularization (HCC)  Esophageal dysphagia  CKD (chronic kidney disease), stage IV (HCC)  History of recent fall: Recurrent falls.  Most recent fall prompted ED visit on 09/21/2023.  She continues to have some residual soreness especially in both knees.  Other iron  deficiency anemia  Chronic anticoagulation: Patient remains on Eliquis  as prescribed.  Orders: Orders Placed This Encounter  Procedures   ANA   Sedimentation rate   C3 and C4   Anti-DNA antibody, double-stranded   No orders of the defined types were placed in this encounter.     Follow-Up Instructions: Return in about 1 year (around 10/04/2024) for +ANA.   Waddell CHRISTELLA Craze, PA-C  Note - This record has been created using Dragon software.  Chart creation errors have been sought, but may not always  have been located. Such creation errors do not reflect on  the standard of medical care.

## 2023-09-21 NOTE — ED Provider Notes (Signed)
 [place] EMERGENCY DEPARTMENT AT [facility] Provider Note   CSN: [id number] Arrival date & time: 09/21/23  1653     Patient presents with: Patricia Jackson is a 83 y.o. female with a history including COPD, hypertension, atrial fibrillation on Eliquis , CAD with history of NSTEMI presenting for evaluation of injury sustained in a fall.  She was napping when she rolled out of bed and hit her left scalp on her nightstand.  She also has complaint of right knee pain, denies hip pain, neck pain, she has noticed a new bruise on her right dorsal forearm but denies significant pain at the site.  She denies LOC, nausea or vomiting, vision changes, she has had no treatment prior to arrival.   The history is provided by the patient.       Prior to Admission medications   Medication Sig Start Date End Date Taking? Authorizing Provider  acetaminophen  (TYLENOL ) 500 MG tablet Take 1,000 mg by mouth every 8 (eight) hours as needed for mild pain or moderate pain. 03/21/17  Yes [provider]  albuterol  (PROVENTIL ) (2.5 MG/3ML) 0.083% nebulizer solution Take 2.5 mg by nebulization every 6 (six) hours as needed for shortness of breath or wheezing.   Yes [provider]  amiodarone  (PACERONE ) 200 MG tablet Take 1 Tablet ( 200 mg ) Daily and none on Sunday Patient taking differently: Take 200 mg by mouth daily. Take 1 Tablet ( 200 mg ) Daily and none on Sunday 05/04/23  Yes Tammie Fall, MD  amLODipine  (NORVASC ) 5 MG tablet Take 5 mg by mouth daily. 08/15/23  Yes [provider]  apixaban  (ELIQUIS ) 2.5 MG TABS tablet Take 1 tablet (2.5 mg total) by mouth 2 (two) times daily. 09/08/23  Yes Justina Oman, MD  Cholecalciferol (VITAMIN D3) 50 MCG (2000 UT) TABS Take 2,000 Units by mouth daily.   Yes [provider]  levothyroxine  (SYNTHROID ) 50 MCG tablet Take 50 mcg by mouth daily before breakfast.  09/20/19  Yes [provider]  linaclotide   (LINZESS ) 290 MCG CAPS capsule Take 1 capsule (290 mcg total) by mouth daily before breakfast. Patient taking differently: Take 290 mcg by mouth daily before breakfast. For constipation 06/19/23  Yes Justina Oman, MD  mirtazapine  (REMERON ) 15 MG tablet Take 7.5 mg by mouth at bedtime. 08/11/23  Yes [provider]  nitroGLYCERIN  (NITROSTAT ) 0.4 MG SL tablet Place 0.4 mg under the tongue every 5 (five) minutes as needed for chest pain. 06/09/19  Yes [provider]  pantoprazole  (PROTONIX ) 40 MG tablet Take 1 tablet (40 mg total) by mouth 2 (two) times daily. 09/12/23 10/12/23 Yes Shah, Pratik D, DO  rosuvastatin  (CRESTOR ) 20 MG tablet Take 20 mg by mouth daily. 02/26/17  Yes [provider]  traZODone  (DESYREL ) 50 MG tablet Take 50 mg by mouth at bedtime. 08/31/23  Yes [provider]  Gaylan Kaufman 200-62.5-25 MCG/ACT AEPB Inhale 1 puff into the lungs daily. 11/02/22  Yes [provider]    Allergies: Povidone-iodine, Sulfa antibiotics, and Sulfamethoxazole    Review of Systems  Constitutional:  Negative for fever.  Gastrointestinal:  Negative for nausea and vomiting.  Musculoskeletal:  Positive for arthralgias. Negative for myalgias.  Skin:  Positive for color change.  Neurological:  Positive for headaches. Negative for weakness and numbness.  All other systems reviewed and are negative.   Updated Vital Signs BP (!) 142/72 (BP Location: Right Arm)   Pulse 66   Temp 98  F (36.7 C) (Oral)   Resp 16   Ht 5' 7 (1.702 m)   Wt 59.4 kg   SpO2 94%   BMI 20.52 kg/m   Physical Exam Vitals and nursing note reviewed.  Constitutional:      Appearance: She is well-developed.  HENT:     Head: Normocephalic. No raccoon eyes or Battle's sign.     Comments: Moderate hematoma noted left temporal/parietal scalp.  Skin is intact without bleeding.  Eyes:     Extraocular Movements: Extraocular movements intact.     Conjunctiva/sclera: Conjunctivae normal.      Pupils: Pupils are equal, round, and reactive to light.    Cardiovascular:     Rate and Rhythm: Normal rate and regular rhythm.     Heart sounds: Normal heart sounds.  Pulmonary:     Effort: Pulmonary effort is normal.     Breath sounds: Normal breath sounds. No wheezing.  Abdominal:     General: Bowel sounds are normal.     Palpations: Abdomen is soft.     Tenderness: There is no abdominal tenderness.   Musculoskeletal:        General: Tenderness present. Normal range of motion.     Right forearm: No deformity or bony tenderness.     Cervical back: Full passive range of motion without pain and normal range of motion. No spinous process tenderness.     Right knee: Swelling and bony tenderness present. No crepitus. No LCL laxity or MCL laxity.     Comments: Tender to palpation right anterior patella with a small raised hematoma/early bruise.  Bruising noted left dorsal proximal forearm.  No pain with range of motion of the elbow forearm or wrist.  Her left wrist appears significantly deformed, patient reports this is a chronic old injury, no injury from today's event.   Skin:    General: Skin is warm and dry.   Neurological:     Mental Status: She is alert.     (all labs ordered are listed, but only abnormal results are displayed) Labs Reviewed - No data to display  EKG: None  Radiology: No results found.   Procedures   Medications Ordered in the ED  acetaminophen  (TYLENOL ) tablet 650 mg (has no administration in time range)    Clinical Course as of 09/21/23 1848  Wed Sep 21, 2023  1841 Rolled out of bed this afternoon. On eliquis . Left temporal hematoma. No LOC. Bruise on right forearm. Bruise on right knee. If scans are ok, can discharge with pcp follow up.  [JR]    Clinical Course User Index [JR] Janalee Mcmurray, PA-C                                 Medical Decision Making Patient presenting with a head injury and right knee injury after falling out of  bed, she is neurovascularly intact, no history of LOC.  Pending imaging at this time.  Patient would be stable for dispo home if her imaging is normal.  Discussed with Valentina Gasman, PA-C who assumes care.  Amount and/or Complexity of Data Reviewed Radiology: ordered.  Risk OTC drugs.        Final diagnoses:  None    ED Discharge Orders     None          Alyse July 09/21/23 Beckey Bourgeois, MD 09/23/23 1233

## 2023-09-22 ENCOUNTER — Encounter: Admitting: *Deleted

## 2023-09-22 VITALS — BP 137/64 | HR 106 | Temp 97.7°F | Resp 16

## 2023-09-22 DIAGNOSIS — N184 Chronic kidney disease, stage 4 (severe): Secondary | ICD-10-CM

## 2023-09-22 DIAGNOSIS — D631 Anemia in chronic kidney disease: Secondary | ICD-10-CM | POA: Diagnosis not present

## 2023-09-22 DIAGNOSIS — D508 Other iron deficiency anemias: Secondary | ICD-10-CM

## 2023-09-22 MED ORDER — IRON SUCROSE 20 MG/ML IV SOLN
200.0000 mg | Freq: Once | INTRAVENOUS | Status: AC
  Administered 2023-09-22: 200 mg via INTRAVENOUS

## 2023-09-22 MED ORDER — ACETAMINOPHEN 325 MG PO TABS
650.0000 mg | ORAL_TABLET | Freq: Once | ORAL | Status: AC
  Administered 2023-09-22: 650 mg via ORAL

## 2023-09-22 MED ORDER — DIPHENHYDRAMINE HCL 25 MG PO CAPS
25.0000 mg | ORAL_CAPSULE | Freq: Once | ORAL | Status: AC
  Administered 2023-09-22: 25 mg via ORAL

## 2023-09-22 NOTE — Progress Notes (Signed)
 Diagnosis: Iron  Deficiency Anemia  Provider:  Cristi Donalds, MD  Procedure: IV Push  IV Type: Peripheral, IV Location: L Hand  Venofer  (Iron  Sucrose), Dose: 200 mg  Post Infusion IV Care: Observation period completed  Discharge: Condition: Good, Destination: Home . AVS Provided  Performed by:  Verneda Golder, RN

## 2023-09-23 DIAGNOSIS — I2510 Atherosclerotic heart disease of native coronary artery without angina pectoris: Secondary | ICD-10-CM | POA: Diagnosis not present

## 2023-09-23 DIAGNOSIS — J439 Emphysema, unspecified: Secondary | ICD-10-CM | POA: Diagnosis not present

## 2023-09-23 DIAGNOSIS — D631 Anemia in chronic kidney disease: Secondary | ICD-10-CM | POA: Diagnosis not present

## 2023-09-23 DIAGNOSIS — D62 Acute posthemorrhagic anemia: Secondary | ICD-10-CM | POA: Diagnosis not present

## 2023-09-23 DIAGNOSIS — J9601 Acute respiratory failure with hypoxia: Secondary | ICD-10-CM | POA: Diagnosis not present

## 2023-09-23 DIAGNOSIS — Z7951 Long term (current) use of inhaled steroids: Secondary | ICD-10-CM | POA: Diagnosis not present

## 2023-09-23 DIAGNOSIS — Z7901 Long term (current) use of anticoagulants: Secondary | ICD-10-CM | POA: Diagnosis not present

## 2023-09-23 DIAGNOSIS — Z955 Presence of coronary angioplasty implant and graft: Secondary | ICD-10-CM | POA: Diagnosis not present

## 2023-09-23 DIAGNOSIS — R1310 Dysphagia, unspecified: Secondary | ICD-10-CM | POA: Diagnosis not present

## 2023-09-23 DIAGNOSIS — J440 Chronic obstructive pulmonary disease with acute lower respiratory infection: Secondary | ICD-10-CM | POA: Diagnosis not present

## 2023-09-23 DIAGNOSIS — E785 Hyperlipidemia, unspecified: Secondary | ICD-10-CM | POA: Diagnosis not present

## 2023-09-23 DIAGNOSIS — K219 Gastro-esophageal reflux disease without esophagitis: Secondary | ICD-10-CM | POA: Diagnosis not present

## 2023-09-23 DIAGNOSIS — J8410 Pulmonary fibrosis, unspecified: Secondary | ICD-10-CM | POA: Diagnosis not present

## 2023-09-23 DIAGNOSIS — I700 Atherosclerosis of aorta: Secondary | ICD-10-CM | POA: Diagnosis not present

## 2023-09-23 DIAGNOSIS — I480 Paroxysmal atrial fibrillation: Secondary | ICD-10-CM | POA: Diagnosis not present

## 2023-09-23 DIAGNOSIS — I252 Old myocardial infarction: Secondary | ICD-10-CM | POA: Diagnosis not present

## 2023-09-23 DIAGNOSIS — E039 Hypothyroidism, unspecified: Secondary | ICD-10-CM | POA: Diagnosis not present

## 2023-09-23 DIAGNOSIS — I4892 Unspecified atrial flutter: Secondary | ICD-10-CM | POA: Diagnosis not present

## 2023-09-23 DIAGNOSIS — Z87891 Personal history of nicotine dependence: Secondary | ICD-10-CM | POA: Diagnosis not present

## 2023-09-23 DIAGNOSIS — Z853 Personal history of malignant neoplasm of breast: Secondary | ICD-10-CM | POA: Diagnosis not present

## 2023-09-23 DIAGNOSIS — N184 Chronic kidney disease, stage 4 (severe): Secondary | ICD-10-CM | POA: Diagnosis not present

## 2023-09-23 DIAGNOSIS — I129 Hypertensive chronic kidney disease with stage 1 through stage 4 chronic kidney disease, or unspecified chronic kidney disease: Secondary | ICD-10-CM | POA: Diagnosis not present

## 2023-09-26 ENCOUNTER — Encounter (INDEPENDENT_AMBULATORY_CARE_PROVIDER_SITE_OTHER): Admitting: *Deleted

## 2023-09-26 VITALS — BP 132/69 | HR 54 | Temp 97.9°F | Resp 16

## 2023-09-26 DIAGNOSIS — J440 Chronic obstructive pulmonary disease with acute lower respiratory infection: Secondary | ICD-10-CM | POA: Diagnosis not present

## 2023-09-26 DIAGNOSIS — D508 Other iron deficiency anemias: Secondary | ICD-10-CM

## 2023-09-26 DIAGNOSIS — D631 Anemia in chronic kidney disease: Secondary | ICD-10-CM

## 2023-09-26 DIAGNOSIS — Z853 Personal history of malignant neoplasm of breast: Secondary | ICD-10-CM | POA: Diagnosis not present

## 2023-09-26 DIAGNOSIS — Z7951 Long term (current) use of inhaled steroids: Secondary | ICD-10-CM | POA: Diagnosis not present

## 2023-09-26 DIAGNOSIS — J9601 Acute respiratory failure with hypoxia: Secondary | ICD-10-CM | POA: Diagnosis not present

## 2023-09-26 DIAGNOSIS — N184 Chronic kidney disease, stage 4 (severe): Secondary | ICD-10-CM | POA: Diagnosis not present

## 2023-09-26 DIAGNOSIS — I2510 Atherosclerotic heart disease of native coronary artery without angina pectoris: Secondary | ICD-10-CM | POA: Diagnosis not present

## 2023-09-26 DIAGNOSIS — J439 Emphysema, unspecified: Secondary | ICD-10-CM | POA: Diagnosis not present

## 2023-09-26 DIAGNOSIS — E785 Hyperlipidemia, unspecified: Secondary | ICD-10-CM | POA: Diagnosis not present

## 2023-09-26 DIAGNOSIS — E039 Hypothyroidism, unspecified: Secondary | ICD-10-CM | POA: Diagnosis not present

## 2023-09-26 DIAGNOSIS — I252 Old myocardial infarction: Secondary | ICD-10-CM | POA: Diagnosis not present

## 2023-09-26 DIAGNOSIS — J8410 Pulmonary fibrosis, unspecified: Secondary | ICD-10-CM | POA: Diagnosis not present

## 2023-09-26 DIAGNOSIS — K219 Gastro-esophageal reflux disease without esophagitis: Secondary | ICD-10-CM | POA: Diagnosis not present

## 2023-09-26 DIAGNOSIS — D62 Acute posthemorrhagic anemia: Secondary | ICD-10-CM | POA: Diagnosis not present

## 2023-09-26 DIAGNOSIS — I4892 Unspecified atrial flutter: Secondary | ICD-10-CM | POA: Diagnosis not present

## 2023-09-26 DIAGNOSIS — I129 Hypertensive chronic kidney disease with stage 1 through stage 4 chronic kidney disease, or unspecified chronic kidney disease: Secondary | ICD-10-CM | POA: Diagnosis not present

## 2023-09-26 DIAGNOSIS — Z87891 Personal history of nicotine dependence: Secondary | ICD-10-CM | POA: Diagnosis not present

## 2023-09-26 DIAGNOSIS — Z7901 Long term (current) use of anticoagulants: Secondary | ICD-10-CM | POA: Diagnosis not present

## 2023-09-26 DIAGNOSIS — Z955 Presence of coronary angioplasty implant and graft: Secondary | ICD-10-CM | POA: Diagnosis not present

## 2023-09-26 DIAGNOSIS — I700 Atherosclerosis of aorta: Secondary | ICD-10-CM | POA: Diagnosis not present

## 2023-09-26 DIAGNOSIS — R1310 Dysphagia, unspecified: Secondary | ICD-10-CM | POA: Diagnosis not present

## 2023-09-26 DIAGNOSIS — I480 Paroxysmal atrial fibrillation: Secondary | ICD-10-CM | POA: Diagnosis not present

## 2023-09-26 MED ORDER — IRON SUCROSE 20 MG/ML IV SOLN
200.0000 mg | Freq: Once | INTRAVENOUS | Status: AC
  Administered 2023-09-26: 200 mg via INTRAVENOUS

## 2023-09-26 MED ORDER — ACETAMINOPHEN 325 MG PO TABS
650.0000 mg | ORAL_TABLET | Freq: Once | ORAL | Status: AC
  Administered 2023-09-26: 650 mg via ORAL

## 2023-09-26 MED ORDER — DIPHENHYDRAMINE HCL 25 MG PO CAPS
25.0000 mg | ORAL_CAPSULE | Freq: Once | ORAL | Status: AC
  Administered 2023-09-26: 25 mg via ORAL

## 2023-09-26 NOTE — Progress Notes (Signed)
 Diagnosis: Iron  Deficiency Anemia  Provider:  Dennise Hoes, MD  Procedure: IV Push  IV Type: Peripheral, IV Location: L Hand  Venofer  (Iron  Sucrose), Dose: 200 mg  Post Infusion IV Care: Observation period completed  Discharge: Condition: Good, Destination: Home . AVS Provided  Performed by:  Baldwin Darice Helling, RN

## 2023-09-28 DIAGNOSIS — Z853 Personal history of malignant neoplasm of breast: Secondary | ICD-10-CM | POA: Diagnosis not present

## 2023-09-28 DIAGNOSIS — Z7951 Long term (current) use of inhaled steroids: Secondary | ICD-10-CM | POA: Diagnosis not present

## 2023-09-28 DIAGNOSIS — D631 Anemia in chronic kidney disease: Secondary | ICD-10-CM | POA: Diagnosis not present

## 2023-09-28 DIAGNOSIS — J9601 Acute respiratory failure with hypoxia: Secondary | ICD-10-CM | POA: Diagnosis not present

## 2023-09-28 DIAGNOSIS — E039 Hypothyroidism, unspecified: Secondary | ICD-10-CM | POA: Diagnosis not present

## 2023-09-28 DIAGNOSIS — J440 Chronic obstructive pulmonary disease with acute lower respiratory infection: Secondary | ICD-10-CM | POA: Diagnosis not present

## 2023-09-28 DIAGNOSIS — I480 Paroxysmal atrial fibrillation: Secondary | ICD-10-CM | POA: Diagnosis not present

## 2023-09-28 DIAGNOSIS — R1310 Dysphagia, unspecified: Secondary | ICD-10-CM | POA: Diagnosis not present

## 2023-09-28 DIAGNOSIS — I2510 Atherosclerotic heart disease of native coronary artery without angina pectoris: Secondary | ICD-10-CM | POA: Diagnosis not present

## 2023-09-28 DIAGNOSIS — D62 Acute posthemorrhagic anemia: Secondary | ICD-10-CM | POA: Diagnosis not present

## 2023-09-28 DIAGNOSIS — E785 Hyperlipidemia, unspecified: Secondary | ICD-10-CM | POA: Diagnosis not present

## 2023-09-28 DIAGNOSIS — J8410 Pulmonary fibrosis, unspecified: Secondary | ICD-10-CM | POA: Diagnosis not present

## 2023-09-28 DIAGNOSIS — Z7901 Long term (current) use of anticoagulants: Secondary | ICD-10-CM | POA: Diagnosis not present

## 2023-09-28 DIAGNOSIS — Z955 Presence of coronary angioplasty implant and graft: Secondary | ICD-10-CM | POA: Diagnosis not present

## 2023-09-28 DIAGNOSIS — I129 Hypertensive chronic kidney disease with stage 1 through stage 4 chronic kidney disease, or unspecified chronic kidney disease: Secondary | ICD-10-CM | POA: Diagnosis not present

## 2023-09-28 DIAGNOSIS — N184 Chronic kidney disease, stage 4 (severe): Secondary | ICD-10-CM | POA: Diagnosis not present

## 2023-09-28 DIAGNOSIS — I700 Atherosclerosis of aorta: Secondary | ICD-10-CM | POA: Diagnosis not present

## 2023-09-28 DIAGNOSIS — Z87891 Personal history of nicotine dependence: Secondary | ICD-10-CM | POA: Diagnosis not present

## 2023-09-28 DIAGNOSIS — I4892 Unspecified atrial flutter: Secondary | ICD-10-CM | POA: Diagnosis not present

## 2023-09-28 DIAGNOSIS — I252 Old myocardial infarction: Secondary | ICD-10-CM | POA: Diagnosis not present

## 2023-09-28 DIAGNOSIS — K219 Gastro-esophageal reflux disease without esophagitis: Secondary | ICD-10-CM | POA: Diagnosis not present

## 2023-09-28 DIAGNOSIS — J439 Emphysema, unspecified: Secondary | ICD-10-CM | POA: Diagnosis not present

## 2023-09-29 DIAGNOSIS — D649 Anemia, unspecified: Secondary | ICD-10-CM | POA: Diagnosis not present

## 2023-09-29 DIAGNOSIS — K921 Melena: Secondary | ICD-10-CM | POA: Diagnosis not present

## 2023-09-29 DIAGNOSIS — G4700 Insomnia, unspecified: Secondary | ICD-10-CM | POA: Diagnosis not present

## 2023-09-29 DIAGNOSIS — K219 Gastro-esophageal reflux disease without esophagitis: Secondary | ICD-10-CM | POA: Diagnosis not present

## 2023-09-30 ENCOUNTER — Ambulatory Visit: Payer: 59 | Admitting: Physician Assistant

## 2023-10-04 ENCOUNTER — Encounter: Payer: Self-pay | Admitting: Internal Medicine

## 2023-10-04 ENCOUNTER — Encounter (HOSPITAL_COMMUNITY): Payer: Self-pay | Admitting: Hematology

## 2023-10-04 DIAGNOSIS — I4892 Unspecified atrial flutter: Secondary | ICD-10-CM | POA: Diagnosis not present

## 2023-10-04 DIAGNOSIS — J9601 Acute respiratory failure with hypoxia: Secondary | ICD-10-CM | POA: Diagnosis not present

## 2023-10-04 DIAGNOSIS — Z955 Presence of coronary angioplasty implant and graft: Secondary | ICD-10-CM | POA: Diagnosis not present

## 2023-10-04 DIAGNOSIS — K219 Gastro-esophageal reflux disease without esophagitis: Secondary | ICD-10-CM | POA: Diagnosis not present

## 2023-10-04 DIAGNOSIS — I252 Old myocardial infarction: Secondary | ICD-10-CM | POA: Diagnosis not present

## 2023-10-04 DIAGNOSIS — D631 Anemia in chronic kidney disease: Secondary | ICD-10-CM | POA: Diagnosis not present

## 2023-10-04 DIAGNOSIS — E785 Hyperlipidemia, unspecified: Secondary | ICD-10-CM | POA: Diagnosis not present

## 2023-10-04 DIAGNOSIS — J8410 Pulmonary fibrosis, unspecified: Secondary | ICD-10-CM | POA: Diagnosis not present

## 2023-10-04 DIAGNOSIS — Z7901 Long term (current) use of anticoagulants: Secondary | ICD-10-CM | POA: Diagnosis not present

## 2023-10-04 DIAGNOSIS — I700 Atherosclerosis of aorta: Secondary | ICD-10-CM | POA: Diagnosis not present

## 2023-10-04 DIAGNOSIS — Z7951 Long term (current) use of inhaled steroids: Secondary | ICD-10-CM | POA: Diagnosis not present

## 2023-10-04 DIAGNOSIS — E782 Mixed hyperlipidemia: Secondary | ICD-10-CM | POA: Diagnosis not present

## 2023-10-04 DIAGNOSIS — Z87891 Personal history of nicotine dependence: Secondary | ICD-10-CM | POA: Diagnosis not present

## 2023-10-04 DIAGNOSIS — J440 Chronic obstructive pulmonary disease with acute lower respiratory infection: Secondary | ICD-10-CM | POA: Diagnosis not present

## 2023-10-04 DIAGNOSIS — D62 Acute posthemorrhagic anemia: Secondary | ICD-10-CM | POA: Diagnosis not present

## 2023-10-04 DIAGNOSIS — J439 Emphysema, unspecified: Secondary | ICD-10-CM | POA: Diagnosis not present

## 2023-10-04 DIAGNOSIS — Z853 Personal history of malignant neoplasm of breast: Secondary | ICD-10-CM | POA: Diagnosis not present

## 2023-10-04 DIAGNOSIS — I129 Hypertensive chronic kidney disease with stage 1 through stage 4 chronic kidney disease, or unspecified chronic kidney disease: Secondary | ICD-10-CM | POA: Diagnosis not present

## 2023-10-04 DIAGNOSIS — I2510 Atherosclerotic heart disease of native coronary artery without angina pectoris: Secondary | ICD-10-CM | POA: Diagnosis not present

## 2023-10-04 DIAGNOSIS — I480 Paroxysmal atrial fibrillation: Secondary | ICD-10-CM | POA: Diagnosis not present

## 2023-10-04 DIAGNOSIS — I10 Essential (primary) hypertension: Secondary | ICD-10-CM | POA: Diagnosis not present

## 2023-10-04 DIAGNOSIS — E039 Hypothyroidism, unspecified: Secondary | ICD-10-CM | POA: Diagnosis not present

## 2023-10-04 DIAGNOSIS — N184 Chronic kidney disease, stage 4 (severe): Secondary | ICD-10-CM | POA: Diagnosis not present

## 2023-10-04 DIAGNOSIS — R1310 Dysphagia, unspecified: Secondary | ICD-10-CM | POA: Diagnosis not present

## 2023-10-05 ENCOUNTER — Encounter: Payer: Self-pay | Admitting: Physician Assistant

## 2023-10-05 ENCOUNTER — Encounter: Attending: Internal Medicine | Admitting: Physician Assistant

## 2023-10-05 VITALS — BP 135/73 | HR 60 | Resp 14 | Ht 67.0 in | Wt 142.0 lb

## 2023-10-05 DIAGNOSIS — R001 Bradycardia, unspecified: Secondary | ICD-10-CM | POA: Insufficient documentation

## 2023-10-05 DIAGNOSIS — R1319 Other dysphagia: Secondary | ICD-10-CM | POA: Insufficient documentation

## 2023-10-05 DIAGNOSIS — E785 Hyperlipidemia, unspecified: Secondary | ICD-10-CM | POA: Insufficient documentation

## 2023-10-05 DIAGNOSIS — Z95820 Peripheral vascular angioplasty status with implants and grafts: Secondary | ICD-10-CM | POA: Insufficient documentation

## 2023-10-05 DIAGNOSIS — D696 Thrombocytopenia, unspecified: Secondary | ICD-10-CM | POA: Insufficient documentation

## 2023-10-05 DIAGNOSIS — M810 Age-related osteoporosis without current pathological fracture: Secondary | ICD-10-CM | POA: Insufficient documentation

## 2023-10-05 DIAGNOSIS — I10 Essential (primary) hypertension: Secondary | ICD-10-CM | POA: Insufficient documentation

## 2023-10-05 DIAGNOSIS — M19041 Primary osteoarthritis, right hand: Secondary | ICD-10-CM | POA: Insufficient documentation

## 2023-10-05 DIAGNOSIS — Z7901 Long term (current) use of anticoagulants: Secondary | ICD-10-CM | POA: Insufficient documentation

## 2023-10-05 DIAGNOSIS — I4892 Unspecified atrial flutter: Secondary | ICD-10-CM | POA: Insufficient documentation

## 2023-10-05 DIAGNOSIS — I2510 Atherosclerotic heart disease of native coronary artery without angina pectoris: Secondary | ICD-10-CM | POA: Insufficient documentation

## 2023-10-05 DIAGNOSIS — I214 Non-ST elevation (NSTEMI) myocardial infarction: Secondary | ICD-10-CM | POA: Diagnosis not present

## 2023-10-05 DIAGNOSIS — I7300 Raynaud's syndrome without gangrene: Secondary | ICD-10-CM | POA: Insufficient documentation

## 2023-10-05 DIAGNOSIS — H353221 Exudative age-related macular degeneration, left eye, with active choroidal neovascularization: Secondary | ICD-10-CM | POA: Insufficient documentation

## 2023-10-05 DIAGNOSIS — Z8709 Personal history of other diseases of the respiratory system: Secondary | ICD-10-CM | POA: Insufficient documentation

## 2023-10-05 DIAGNOSIS — R768 Other specified abnormal immunological findings in serum: Secondary | ICD-10-CM | POA: Insufficient documentation

## 2023-10-05 DIAGNOSIS — M19042 Primary osteoarthritis, left hand: Secondary | ICD-10-CM | POA: Insufficient documentation

## 2023-10-05 DIAGNOSIS — Z9181 History of falling: Secondary | ICD-10-CM | POA: Insufficient documentation

## 2023-10-05 DIAGNOSIS — D508 Other iron deficiency anemias: Secondary | ICD-10-CM | POA: Insufficient documentation

## 2023-10-05 DIAGNOSIS — N184 Chronic kidney disease, stage 4 (severe): Secondary | ICD-10-CM | POA: Insufficient documentation

## 2023-10-06 DIAGNOSIS — D62 Acute posthemorrhagic anemia: Secondary | ICD-10-CM | POA: Diagnosis not present

## 2023-10-06 DIAGNOSIS — I2510 Atherosclerotic heart disease of native coronary artery without angina pectoris: Secondary | ICD-10-CM | POA: Diagnosis not present

## 2023-10-06 DIAGNOSIS — N184 Chronic kidney disease, stage 4 (severe): Secondary | ICD-10-CM | POA: Diagnosis not present

## 2023-10-06 DIAGNOSIS — D631 Anemia in chronic kidney disease: Secondary | ICD-10-CM | POA: Diagnosis not present

## 2023-10-06 DIAGNOSIS — J9601 Acute respiratory failure with hypoxia: Secondary | ICD-10-CM | POA: Diagnosis not present

## 2023-10-06 DIAGNOSIS — R1310 Dysphagia, unspecified: Secondary | ICD-10-CM | POA: Diagnosis not present

## 2023-10-06 DIAGNOSIS — Z7901 Long term (current) use of anticoagulants: Secondary | ICD-10-CM | POA: Diagnosis not present

## 2023-10-06 DIAGNOSIS — Z853 Personal history of malignant neoplasm of breast: Secondary | ICD-10-CM | POA: Diagnosis not present

## 2023-10-06 DIAGNOSIS — E785 Hyperlipidemia, unspecified: Secondary | ICD-10-CM | POA: Diagnosis not present

## 2023-10-06 DIAGNOSIS — J440 Chronic obstructive pulmonary disease with acute lower respiratory infection: Secondary | ICD-10-CM | POA: Diagnosis not present

## 2023-10-06 DIAGNOSIS — I252 Old myocardial infarction: Secondary | ICD-10-CM | POA: Diagnosis not present

## 2023-10-06 DIAGNOSIS — K219 Gastro-esophageal reflux disease without esophagitis: Secondary | ICD-10-CM | POA: Diagnosis not present

## 2023-10-06 DIAGNOSIS — Z87891 Personal history of nicotine dependence: Secondary | ICD-10-CM | POA: Diagnosis not present

## 2023-10-06 DIAGNOSIS — I480 Paroxysmal atrial fibrillation: Secondary | ICD-10-CM | POA: Diagnosis not present

## 2023-10-06 DIAGNOSIS — I4892 Unspecified atrial flutter: Secondary | ICD-10-CM | POA: Diagnosis not present

## 2023-10-06 DIAGNOSIS — E039 Hypothyroidism, unspecified: Secondary | ICD-10-CM | POA: Diagnosis not present

## 2023-10-06 DIAGNOSIS — Z7951 Long term (current) use of inhaled steroids: Secondary | ICD-10-CM | POA: Diagnosis not present

## 2023-10-06 DIAGNOSIS — J439 Emphysema, unspecified: Secondary | ICD-10-CM | POA: Diagnosis not present

## 2023-10-06 DIAGNOSIS — J8410 Pulmonary fibrosis, unspecified: Secondary | ICD-10-CM | POA: Diagnosis not present

## 2023-10-06 DIAGNOSIS — I700 Atherosclerosis of aorta: Secondary | ICD-10-CM | POA: Diagnosis not present

## 2023-10-06 DIAGNOSIS — I129 Hypertensive chronic kidney disease with stage 1 through stage 4 chronic kidney disease, or unspecified chronic kidney disease: Secondary | ICD-10-CM | POA: Diagnosis not present

## 2023-10-06 DIAGNOSIS — Z955 Presence of coronary angioplasty implant and graft: Secondary | ICD-10-CM | POA: Diagnosis not present

## 2023-10-10 ENCOUNTER — Other Ambulatory Visit: Payer: Self-pay | Admitting: Cardiology

## 2023-10-10 DIAGNOSIS — I4892 Unspecified atrial flutter: Secondary | ICD-10-CM

## 2023-10-10 DIAGNOSIS — I4891 Unspecified atrial fibrillation: Secondary | ICD-10-CM

## 2023-10-10 NOTE — Telephone Encounter (Signed)
 Prescription refill request for Eliquis  received. Indication: A Flutter Last office visit: 05/12/23  JINNY Ross MD Scr: 1.20 on 09/12/23  Epic Age: 83 Weight: 62.1kg  Pt is taking Eliquis  2,5mg  twice daily.  Based on above findings Eliquis  5mg  twice daily is the appropriate dose.  Message sent to PharmD Pool to advise on correct dosage.

## 2023-10-11 DIAGNOSIS — I10 Essential (primary) hypertension: Secondary | ICD-10-CM | POA: Diagnosis not present

## 2023-10-11 DIAGNOSIS — Z7901 Long term (current) use of anticoagulants: Secondary | ICD-10-CM | POA: Diagnosis not present

## 2023-10-11 DIAGNOSIS — I700 Atherosclerosis of aorta: Secondary | ICD-10-CM | POA: Diagnosis not present

## 2023-10-11 DIAGNOSIS — Z7951 Long term (current) use of inhaled steroids: Secondary | ICD-10-CM | POA: Diagnosis not present

## 2023-10-11 DIAGNOSIS — D509 Iron deficiency anemia, unspecified: Secondary | ICD-10-CM | POA: Diagnosis not present

## 2023-10-11 DIAGNOSIS — E782 Mixed hyperlipidemia: Secondary | ICD-10-CM | POA: Diagnosis not present

## 2023-10-11 DIAGNOSIS — I4891 Unspecified atrial fibrillation: Secondary | ICD-10-CM | POA: Diagnosis not present

## 2023-10-11 DIAGNOSIS — F5101 Primary insomnia: Secondary | ICD-10-CM | POA: Diagnosis not present

## 2023-10-11 DIAGNOSIS — I480 Paroxysmal atrial fibrillation: Secondary | ICD-10-CM | POA: Diagnosis not present

## 2023-10-11 DIAGNOSIS — J9601 Acute respiratory failure with hypoxia: Secondary | ICD-10-CM | POA: Diagnosis not present

## 2023-10-11 DIAGNOSIS — J440 Chronic obstructive pulmonary disease with acute lower respiratory infection: Secondary | ICD-10-CM | POA: Diagnosis not present

## 2023-10-11 DIAGNOSIS — K219 Gastro-esophageal reflux disease without esophagitis: Secondary | ICD-10-CM | POA: Diagnosis not present

## 2023-10-11 DIAGNOSIS — R1310 Dysphagia, unspecified: Secondary | ICD-10-CM | POA: Diagnosis not present

## 2023-10-11 DIAGNOSIS — I129 Hypertensive chronic kidney disease with stage 1 through stage 4 chronic kidney disease, or unspecified chronic kidney disease: Secondary | ICD-10-CM | POA: Diagnosis not present

## 2023-10-11 DIAGNOSIS — J8410 Pulmonary fibrosis, unspecified: Secondary | ICD-10-CM | POA: Diagnosis not present

## 2023-10-11 DIAGNOSIS — D631 Anemia in chronic kidney disease: Secondary | ICD-10-CM | POA: Diagnosis not present

## 2023-10-11 DIAGNOSIS — N184 Chronic kidney disease, stage 4 (severe): Secondary | ICD-10-CM | POA: Diagnosis not present

## 2023-10-11 DIAGNOSIS — D62 Acute posthemorrhagic anemia: Secondary | ICD-10-CM | POA: Diagnosis not present

## 2023-10-11 DIAGNOSIS — I4892 Unspecified atrial flutter: Secondary | ICD-10-CM | POA: Diagnosis not present

## 2023-10-11 DIAGNOSIS — J439 Emphysema, unspecified: Secondary | ICD-10-CM | POA: Diagnosis not present

## 2023-10-11 DIAGNOSIS — Z87891 Personal history of nicotine dependence: Secondary | ICD-10-CM | POA: Diagnosis not present

## 2023-10-11 DIAGNOSIS — J449 Chronic obstructive pulmonary disease, unspecified: Secondary | ICD-10-CM | POA: Diagnosis not present

## 2023-10-11 DIAGNOSIS — D500 Iron deficiency anemia secondary to blood loss (chronic): Secondary | ICD-10-CM | POA: Diagnosis not present

## 2023-10-11 DIAGNOSIS — E039 Hypothyroidism, unspecified: Secondary | ICD-10-CM | POA: Diagnosis not present

## 2023-10-11 DIAGNOSIS — E559 Vitamin D deficiency, unspecified: Secondary | ICD-10-CM | POA: Diagnosis not present

## 2023-10-11 DIAGNOSIS — E785 Hyperlipidemia, unspecified: Secondary | ICD-10-CM | POA: Diagnosis not present

## 2023-10-11 DIAGNOSIS — Z853 Personal history of malignant neoplasm of breast: Secondary | ICD-10-CM | POA: Diagnosis not present

## 2023-10-11 DIAGNOSIS — I2510 Atherosclerotic heart disease of native coronary artery without angina pectoris: Secondary | ICD-10-CM | POA: Diagnosis not present

## 2023-10-11 DIAGNOSIS — Z955 Presence of coronary angioplasty implant and graft: Secondary | ICD-10-CM | POA: Diagnosis not present

## 2023-10-11 DIAGNOSIS — I7300 Raynaud's syndrome without gangrene: Secondary | ICD-10-CM | POA: Diagnosis not present

## 2023-10-11 DIAGNOSIS — I252 Old myocardial infarction: Secondary | ICD-10-CM | POA: Diagnosis not present

## 2023-10-11 LAB — C3 AND C4
C3 Complement: 118 mg/dL (ref 83–193)
C4 Complement: 11 mg/dL — ABNORMAL LOW (ref 15–57)

## 2023-10-11 LAB — ANTI-NUCLEAR AB-TITER (ANA TITER)
ANA TITER: 1:320 {titer} — ABNORMAL HIGH
ANA Titer 1: 1:40 {titer} — ABNORMAL HIGH

## 2023-10-11 LAB — ANA: Anti Nuclear Antibody (ANA): POSITIVE — AB

## 2023-10-11 LAB — ANTI-DNA ANTIBODY, DOUBLE-STRANDED: ds DNA Ab: 1 [IU]/mL

## 2023-10-11 LAB — SEDIMENTATION RATE: Sed Rate: 36 mm/h — ABNORMAL HIGH (ref 0–30)

## 2023-10-11 NOTE — Telephone Encounter (Signed)
 I think the last entry for weight is wrong. She was just 131lb in June. Her scr fluctuates and she is a high fall risk. Would leave at 2.5mg 

## 2023-10-12 ENCOUNTER — Ambulatory Visit: Payer: Self-pay | Admitting: Physician Assistant

## 2023-10-12 NOTE — Progress Notes (Signed)
 ANA remains positive.  Double-stranded DNA negative.  C4 slightly low.  C3 within normal limits.  ESR is borderline elevated. Please advise patient to notify us  if she develops any signs or symptoms of an autoimmune disease flare.   Recommend rechecking lab work in 6 months.

## 2023-10-13 ENCOUNTER — Other Ambulatory Visit: Payer: Self-pay | Admitting: Cardiology

## 2023-10-13 DIAGNOSIS — H353231 Exudative age-related macular degeneration, bilateral, with active choroidal neovascularization: Secondary | ICD-10-CM | POA: Diagnosis not present

## 2023-10-13 DIAGNOSIS — I4891 Unspecified atrial fibrillation: Secondary | ICD-10-CM

## 2023-10-13 DIAGNOSIS — H4311 Vitreous hemorrhage, right eye: Secondary | ICD-10-CM | POA: Diagnosis not present

## 2023-10-13 DIAGNOSIS — H35341 Macular cyst, hole, or pseudohole, right eye: Secondary | ICD-10-CM | POA: Diagnosis not present

## 2023-10-13 DIAGNOSIS — H35051 Retinal neovascularization, unspecified, right eye: Secondary | ICD-10-CM | POA: Diagnosis not present

## 2023-10-13 DIAGNOSIS — H3321 Serous retinal detachment, right eye: Secondary | ICD-10-CM | POA: Diagnosis not present

## 2023-10-13 DIAGNOSIS — H3561 Retinal hemorrhage, right eye: Secondary | ICD-10-CM | POA: Diagnosis not present

## 2023-10-13 DIAGNOSIS — H33001 Unspecified retinal detachment with retinal break, right eye: Secondary | ICD-10-CM | POA: Diagnosis not present

## 2023-10-13 DIAGNOSIS — H33051 Total retinal detachment, right eye: Secondary | ICD-10-CM | POA: Diagnosis not present

## 2023-10-13 DIAGNOSIS — H35373 Puckering of macula, bilateral: Secondary | ICD-10-CM | POA: Diagnosis not present

## 2023-10-13 DIAGNOSIS — I4892 Unspecified atrial flutter: Secondary | ICD-10-CM

## 2023-10-14 ENCOUNTER — Other Ambulatory Visit: Payer: Self-pay | Admitting: Cardiology

## 2023-10-14 DIAGNOSIS — D649 Anemia, unspecified: Secondary | ICD-10-CM | POA: Diagnosis not present

## 2023-10-14 DIAGNOSIS — R7301 Impaired fasting glucose: Secondary | ICD-10-CM | POA: Diagnosis not present

## 2023-10-14 DIAGNOSIS — K921 Melena: Secondary | ICD-10-CM | POA: Diagnosis not present

## 2023-10-14 DIAGNOSIS — G4700 Insomnia, unspecified: Secondary | ICD-10-CM | POA: Diagnosis not present

## 2023-10-14 DIAGNOSIS — I4891 Unspecified atrial fibrillation: Secondary | ICD-10-CM

## 2023-10-14 DIAGNOSIS — E782 Mixed hyperlipidemia: Secondary | ICD-10-CM | POA: Diagnosis not present

## 2023-10-14 DIAGNOSIS — I4892 Unspecified atrial flutter: Secondary | ICD-10-CM

## 2023-10-14 DIAGNOSIS — M545 Low back pain, unspecified: Secondary | ICD-10-CM | POA: Diagnosis not present

## 2023-10-14 DIAGNOSIS — E559 Vitamin D deficiency, unspecified: Secondary | ICD-10-CM | POA: Diagnosis not present

## 2023-10-14 DIAGNOSIS — K219 Gastro-esophageal reflux disease without esophagitis: Secondary | ICD-10-CM | POA: Diagnosis not present

## 2023-10-14 DIAGNOSIS — E039 Hypothyroidism, unspecified: Secondary | ICD-10-CM | POA: Diagnosis not present

## 2023-10-14 DIAGNOSIS — I10 Essential (primary) hypertension: Secondary | ICD-10-CM | POA: Diagnosis not present

## 2023-10-14 DIAGNOSIS — K5900 Constipation, unspecified: Secondary | ICD-10-CM | POA: Diagnosis not present

## 2023-10-14 DIAGNOSIS — J449 Chronic obstructive pulmonary disease, unspecified: Secondary | ICD-10-CM | POA: Diagnosis not present

## 2023-10-14 DIAGNOSIS — D6869 Other thrombophilia: Secondary | ICD-10-CM | POA: Diagnosis not present

## 2023-10-14 MED ORDER — NITROGLYCERIN 0.4 MG SL SUBL
0.4000 mg | SUBLINGUAL_TABLET | SUBLINGUAL | 2 refills | Status: DC | PRN
Start: 2023-10-14 — End: 2024-01-31

## 2023-10-14 NOTE — Telephone Encounter (Signed)
 Pt last saw Dr Alvan 05/12/23, last labs 09/12/23 Creat 1.20, age 83, weight 64.4kg [name], Patricia Jackson, RPH-CPP    10/11/23  9:43 AM Note I think the last entry for weight is wrong. She was just 131lb in June. Her scr fluctuates and she is a high fall risk. Would leave at 2.5mg 

## 2023-10-14 NOTE — Telephone Encounter (Signed)
*  STAT* If patient is at the pharmacy, call can be transferred to refill team.   1. Which medications need to be refilled? (please list name of each medication and dose if known)  apixaban  (ELIQUIS ) 2.5 MG TABS tablet nitroGLYCERIN  (NITROSTAT ) 0.4 MG SL tablet acetaminophen  (TYLENOL ) 500 MG tablet    2. Which pharmacy/location (including street and city if local pharmacy) is medication to be sent to? [facility] - [address] [place] - [address]  3. Do they need a 30 day or 90 day supply?  30 day supply

## 2023-10-14 NOTE — Telephone Encounter (Signed)
 RX for Nitroglycerin  sent in. RX for Eliquis  routed to Pharmacy. RX for Tylenol  needs to come from PCP.

## 2023-10-14 NOTE — Telephone Encounter (Signed)
 Eliquis  has already been sent to pt's pharmacy, per pharmacy request, see refill request in Epic from 10/13/23.

## 2023-10-17 DIAGNOSIS — J439 Emphysema, unspecified: Secondary | ICD-10-CM | POA: Diagnosis not present

## 2023-10-17 DIAGNOSIS — Z87891 Personal history of nicotine dependence: Secondary | ICD-10-CM | POA: Diagnosis not present

## 2023-10-17 DIAGNOSIS — E785 Hyperlipidemia, unspecified: Secondary | ICD-10-CM | POA: Diagnosis not present

## 2023-10-17 DIAGNOSIS — I700 Atherosclerosis of aorta: Secondary | ICD-10-CM | POA: Diagnosis not present

## 2023-10-17 DIAGNOSIS — Z853 Personal history of malignant neoplasm of breast: Secondary | ICD-10-CM | POA: Diagnosis not present

## 2023-10-17 DIAGNOSIS — Z7951 Long term (current) use of inhaled steroids: Secondary | ICD-10-CM | POA: Diagnosis not present

## 2023-10-17 DIAGNOSIS — K219 Gastro-esophageal reflux disease without esophagitis: Secondary | ICD-10-CM | POA: Diagnosis not present

## 2023-10-17 DIAGNOSIS — Z955 Presence of coronary angioplasty implant and graft: Secondary | ICD-10-CM | POA: Diagnosis not present

## 2023-10-17 DIAGNOSIS — J9601 Acute respiratory failure with hypoxia: Secondary | ICD-10-CM | POA: Diagnosis not present

## 2023-10-17 DIAGNOSIS — J8410 Pulmonary fibrosis, unspecified: Secondary | ICD-10-CM | POA: Diagnosis not present

## 2023-10-17 DIAGNOSIS — I480 Paroxysmal atrial fibrillation: Secondary | ICD-10-CM | POA: Diagnosis not present

## 2023-10-17 DIAGNOSIS — D631 Anemia in chronic kidney disease: Secondary | ICD-10-CM | POA: Diagnosis not present

## 2023-10-17 DIAGNOSIS — I129 Hypertensive chronic kidney disease with stage 1 through stage 4 chronic kidney disease, or unspecified chronic kidney disease: Secondary | ICD-10-CM | POA: Diagnosis not present

## 2023-10-17 DIAGNOSIS — I4892 Unspecified atrial flutter: Secondary | ICD-10-CM | POA: Diagnosis not present

## 2023-10-17 DIAGNOSIS — E039 Hypothyroidism, unspecified: Secondary | ICD-10-CM | POA: Diagnosis not present

## 2023-10-17 DIAGNOSIS — I2510 Atherosclerotic heart disease of native coronary artery without angina pectoris: Secondary | ICD-10-CM | POA: Diagnosis not present

## 2023-10-17 DIAGNOSIS — D62 Acute posthemorrhagic anemia: Secondary | ICD-10-CM | POA: Diagnosis not present

## 2023-10-17 DIAGNOSIS — R1310 Dysphagia, unspecified: Secondary | ICD-10-CM | POA: Diagnosis not present

## 2023-10-17 DIAGNOSIS — J440 Chronic obstructive pulmonary disease with acute lower respiratory infection: Secondary | ICD-10-CM | POA: Diagnosis not present

## 2023-10-17 DIAGNOSIS — Z7901 Long term (current) use of anticoagulants: Secondary | ICD-10-CM | POA: Diagnosis not present

## 2023-10-17 DIAGNOSIS — I252 Old myocardial infarction: Secondary | ICD-10-CM | POA: Diagnosis not present

## 2023-10-17 DIAGNOSIS — N184 Chronic kidney disease, stage 4 (severe): Secondary | ICD-10-CM | POA: Diagnosis not present

## 2023-10-21 ENCOUNTER — Encounter: Payer: Self-pay | Admitting: Internal Medicine

## 2023-10-21 ENCOUNTER — Encounter (HOSPITAL_COMMUNITY): Payer: Self-pay | Admitting: Hematology

## 2023-10-24 DIAGNOSIS — D696 Thrombocytopenia, unspecified: Secondary | ICD-10-CM | POA: Diagnosis not present

## 2023-10-25 ENCOUNTER — Telehealth: Payer: Self-pay | Admitting: Cardiology

## 2023-10-25 ENCOUNTER — Other Ambulatory Visit: Payer: Self-pay

## 2023-10-25 DIAGNOSIS — F5101 Primary insomnia: Secondary | ICD-10-CM | POA: Diagnosis not present

## 2023-10-25 DIAGNOSIS — E039 Hypothyroidism, unspecified: Secondary | ICD-10-CM | POA: Diagnosis not present

## 2023-10-25 DIAGNOSIS — I4891 Unspecified atrial fibrillation: Secondary | ICD-10-CM | POA: Diagnosis not present

## 2023-10-25 DIAGNOSIS — I7300 Raynaud's syndrome without gangrene: Secondary | ICD-10-CM | POA: Diagnosis not present

## 2023-10-25 DIAGNOSIS — E559 Vitamin D deficiency, unspecified: Secondary | ICD-10-CM | POA: Diagnosis not present

## 2023-10-25 DIAGNOSIS — E782 Mixed hyperlipidemia: Secondary | ICD-10-CM | POA: Diagnosis not present

## 2023-10-25 DIAGNOSIS — K219 Gastro-esophageal reflux disease without esophagitis: Secondary | ICD-10-CM | POA: Diagnosis not present

## 2023-10-25 DIAGNOSIS — J449 Chronic obstructive pulmonary disease, unspecified: Secondary | ICD-10-CM | POA: Diagnosis not present

## 2023-10-25 DIAGNOSIS — D509 Iron deficiency anemia, unspecified: Secondary | ICD-10-CM | POA: Diagnosis not present

## 2023-10-25 DIAGNOSIS — N184 Chronic kidney disease, stage 4 (severe): Secondary | ICD-10-CM | POA: Diagnosis not present

## 2023-10-25 DIAGNOSIS — I10 Essential (primary) hypertension: Secondary | ICD-10-CM | POA: Diagnosis not present

## 2023-10-25 DIAGNOSIS — D500 Iron deficiency anemia secondary to blood loss (chronic): Secondary | ICD-10-CM | POA: Diagnosis not present

## 2023-10-25 DIAGNOSIS — I4892 Unspecified atrial flutter: Secondary | ICD-10-CM

## 2023-10-25 MED ORDER — AMIODARONE HCL 200 MG PO TABS: ORAL_TABLET | ORAL | 1 refills | Status: DC

## 2023-10-25 MED ORDER — APIXABAN 2.5 MG PO TABS: 2.5000 mg | ORAL_TABLET | Freq: Two times a day (BID) | ORAL | 5 refills | Status: AC

## 2023-10-25 NOTE — Telephone Encounter (Signed)
 Pt's medication was sent to pt's pharmacy as requested. Confirmation received.

## 2023-10-25 NOTE — Telephone Encounter (Signed)
*  STAT* If patient is at the pharmacy, call can be transferred to refill team.   1. Which medications need to be refilled? (please list name of each medication and dose if known) amiodarone  (PACERONE ) 200 MG tablet   2. Which pharmacy/location (including street and city if local pharmacy) is medication to be sent to? [facility] - [place], [place] - [address]   3. Do they need a 30 day or 90 day supply? 90

## 2023-10-25 NOTE — Telephone Encounter (Signed)
*  STAT* If patient is at the pharmacy, call can be transferred to refill team.   1. Which medications need to be refilled? (please list name of each medication and dose if known) Eliquis   2. Would you like to learn more about the convenience, safety, & potential cost savings by using the [facility]?        3. Are you open to using the [facility] (Type [facility].    4. Which pharmacy/location (including street and city if local pharmacy) is medication to be sent to?[address] [place], [place]   5. Do they need a 30 day or 90 day supply? 30 days #60 and refills

## 2023-10-25 NOTE — Telephone Encounter (Signed)
 Prescription refill request for Eliquis  received. Indication:aflutter Last office visit:2/25 Scr:1.20  6/25 Age: 83 Weight:64.4  kg  Prescription refilled

## 2023-10-27 DIAGNOSIS — I700 Atherosclerosis of aorta: Secondary | ICD-10-CM | POA: Diagnosis not present

## 2023-10-27 DIAGNOSIS — N184 Chronic kidney disease, stage 4 (severe): Secondary | ICD-10-CM | POA: Diagnosis not present

## 2023-10-27 DIAGNOSIS — I480 Paroxysmal atrial fibrillation: Secondary | ICD-10-CM | POA: Diagnosis not present

## 2023-10-27 DIAGNOSIS — J440 Chronic obstructive pulmonary disease with acute lower respiratory infection: Secondary | ICD-10-CM | POA: Diagnosis not present

## 2023-10-27 DIAGNOSIS — D62 Acute posthemorrhagic anemia: Secondary | ICD-10-CM | POA: Diagnosis not present

## 2023-10-27 DIAGNOSIS — Z7951 Long term (current) use of inhaled steroids: Secondary | ICD-10-CM | POA: Diagnosis not present

## 2023-10-27 DIAGNOSIS — Z955 Presence of coronary angioplasty implant and graft: Secondary | ICD-10-CM | POA: Diagnosis not present

## 2023-10-27 DIAGNOSIS — I129 Hypertensive chronic kidney disease with stage 1 through stage 4 chronic kidney disease, or unspecified chronic kidney disease: Secondary | ICD-10-CM | POA: Diagnosis not present

## 2023-10-27 DIAGNOSIS — J8410 Pulmonary fibrosis, unspecified: Secondary | ICD-10-CM | POA: Diagnosis not present

## 2023-10-27 DIAGNOSIS — Z87891 Personal history of nicotine dependence: Secondary | ICD-10-CM | POA: Diagnosis not present

## 2023-10-27 DIAGNOSIS — K219 Gastro-esophageal reflux disease without esophagitis: Secondary | ICD-10-CM | POA: Diagnosis not present

## 2023-10-27 DIAGNOSIS — R1310 Dysphagia, unspecified: Secondary | ICD-10-CM | POA: Diagnosis not present

## 2023-10-27 DIAGNOSIS — Z7901 Long term (current) use of anticoagulants: Secondary | ICD-10-CM | POA: Diagnosis not present

## 2023-10-27 DIAGNOSIS — J9601 Acute respiratory failure with hypoxia: Secondary | ICD-10-CM | POA: Diagnosis not present

## 2023-10-27 DIAGNOSIS — Z853 Personal history of malignant neoplasm of breast: Secondary | ICD-10-CM | POA: Diagnosis not present

## 2023-10-27 DIAGNOSIS — D631 Anemia in chronic kidney disease: Secondary | ICD-10-CM | POA: Diagnosis not present

## 2023-10-27 DIAGNOSIS — J439 Emphysema, unspecified: Secondary | ICD-10-CM | POA: Diagnosis not present

## 2023-10-27 DIAGNOSIS — E039 Hypothyroidism, unspecified: Secondary | ICD-10-CM | POA: Diagnosis not present

## 2023-10-27 DIAGNOSIS — E785 Hyperlipidemia, unspecified: Secondary | ICD-10-CM | POA: Diagnosis not present

## 2023-10-27 DIAGNOSIS — I252 Old myocardial infarction: Secondary | ICD-10-CM | POA: Diagnosis not present

## 2023-10-27 DIAGNOSIS — I4892 Unspecified atrial flutter: Secondary | ICD-10-CM | POA: Diagnosis not present

## 2023-10-27 DIAGNOSIS — I2510 Atherosclerotic heart disease of native coronary artery without angina pectoris: Secondary | ICD-10-CM | POA: Diagnosis not present

## 2023-11-01 ENCOUNTER — Ambulatory Visit (INDEPENDENT_AMBULATORY_CARE_PROVIDER_SITE_OTHER): Admitting: Gastroenterology

## 2023-11-01 ENCOUNTER — Other Ambulatory Visit: Payer: Self-pay | Admitting: *Deleted

## 2023-11-01 VITALS — BP 128/72 | HR 60 | Temp 97.8°F | Ht 67.0 in | Wt 142.2 lb

## 2023-11-01 DIAGNOSIS — K31819 Angiodysplasia of stomach and duodenum without bleeding: Secondary | ICD-10-CM

## 2023-11-01 DIAGNOSIS — Z860100 Personal history of colon polyps, unspecified: Secondary | ICD-10-CM | POA: Diagnosis not present

## 2023-11-01 DIAGNOSIS — D500 Iron deficiency anemia secondary to blood loss (chronic): Secondary | ICD-10-CM | POA: Diagnosis not present

## 2023-11-01 DIAGNOSIS — K5900 Constipation, unspecified: Secondary | ICD-10-CM

## 2023-11-01 DIAGNOSIS — D696 Thrombocytopenia, unspecified: Secondary | ICD-10-CM | POA: Diagnosis not present

## 2023-11-01 DIAGNOSIS — D649 Anemia, unspecified: Secondary | ICD-10-CM

## 2023-11-01 NOTE — Patient Instructions (Signed)
 Continue pantoprazole  twice a day for now.   I am referring you to Hematology due to iron  deficiency and low platelets.  Please call with any worsening fatigue, obvious bleeding, or other concerns.  We will see you in early September and arrange colonoscopy at that time!  It was a pleasure to see you today. I want to create trusting relationships with patients and provide genuine, compassionate, and quality care. I truly value your feedback, so please be on the lookout for a survey regarding your visit with me today. I appreciate your time in completing this!         Therisa MICAEL Stager, PhD, ANP-BC [facility]

## 2023-11-01 NOTE — Progress Notes (Signed)
 Gastroenterology Office Note     Primary Care Physician:  Shona Norleen PEDLAR, MD  Primary Gastroenterologist: Dr. Shaaron    Chief Complaint   Chief Complaint  Patient presents with   Follow-up    Follow up on stomach issues, but pt states there is none. Follow up ron     History of Present Illness   Patricia Jackson is an 83 y.o. 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  . She returns today in hospital follow-up.   She is due for a colonoscopy in September as had poor prep earlier this year, multiple polyps, and a large polyp removed distal to the IC valve with clip placement.    Hgb 8.9 on 6/9. Outside labs with Hgb 10.9 on 7/11, platelets 71. Upon review, platelets have been low or clumped on multiple specimens.   Today: had received rounds of IV iron  and started oral iron  recently. Noted darker stool on iron . Stool appears dark and tarry, but this is chronic. No abdominal pain. No hematochezia. Always feels tired but pushes through. Denies feeling more fatigued than baseline. States she doesn't sleep well at night. Chronic SOB with COPD but not at rest. No supplemental oxygen . No GERD. Eliquis  BID. No hx of liver disease.   Linzess  290 mcg daily working well. BM about every other day. Pantoprazole  BID. No dysphagia.   Prior Endoscopic Procedures:   EGD/enteroscopy June 2025: low-grade Schatzki ring s/p dilation, 1 cm hiatal hernia, single non-bleeding angiodysplastic lesion in the stomach s/p APC localized area of hemorrhagic mucosa in  proximal jejunum and punctate area of ongoing oozing, query Dieulafoy s/p clip placement.   EGD March 2025: normal esophagus, single non-bleeding angioectasia s/p APC, 2 non-bleeding angioectasias in duodenum s/p APC.      Colonoscopy March 2025: poor prep, diverticulosis, multiple polyps with six 8-30 mm polyps at IC valve removed with hot snare, 15x 30 mm polyp distal to IC valve with removal and clip placement, ink marked. Colonoscopy in 6 months (Sept 2025). Path with tubular adenomas.      EGD August 2024: -Nonobstructing Schatzki's ring -Small hiatal hernia -Irregular Z-line -Single bleeding angiectasia in the stomach s/p APC -2 nonbleeding angiectasia in the stomach s/p APC -Single nonbleeding angioectasia in the second portion of duodenum -Advise outpatient colonoscopy, start oral iron  therapy.  Continue PPI BID       Colonoscopy Feb 2018:multiple diverticula found in the sigmoid and descending colon. 2 sessile polyps removed from the hepatic flexure measuring 5-6 mm in size. Pathology revealed tubular adenomas    EGD FEb 2018: LA grade a esophagitis, no Barrett's. Esophagus was dilated with a 54 [place] Maloney dilator. She has small hiatal hernia      Past Medical History:  Diagnosis Date   A-fib [facility])    Atrial flutter (HCC)    On Eliquis  in 8/17 but discontinued after stent placement   Breast cancer (HCC)    remote   CAD in native artery 03/16/2016   COPD (chronic obstructive pulmonary disease) (HCC)    Dysrhythmia    Hypertension  Iron  deficiency anemia 06/12/2021   NSTEMI (non-ST elevated myocardial infarction) (HCC)    2017   S/P angioplasty with stent 03/15/16 DES Resolute, pLCX 03/16/2016   Vitreous hemorrhage of left eye (HCC) 08/01/2019    Past Surgical History:  Procedure Laterality Date   APPENDECTOMY     BREAST SURGERY Left    CARDIAC CATHETERIZATION N/A 03/15/2016   Procedure: Left Heart Cath and Coronary Angiography;  Surgeon: Victory LELON Sharps,  MD;  Location: [facility] INVASIVE CV LAB;  Service: Cardiovascular;  Laterality: N/A;   CARDIAC CATHETERIZATION N/A 03/15/2016   Procedure: Coronary Stent Intervention;  Surgeon: Victory LELON Sharps, MD;  Location: [facility] INVASIVE CV LAB;  Service: Cardiovascular;  Laterality: N/A;   CARDIOVERSION N/A 02/04/2023   Procedure: CARDIOVERSION;  Surgeon: Stacia Diannah SQUIBB, MD;  Location: [facility] ORS;  Service: Cardiovascular;  Laterality: N/A;   COLONOSCOPY     remote   COLONOSCOPY N/A 05/26/2016   Procedure: COLONOSCOPY;  Surgeon: Lamar CHRISTELLA Hollingshead, MD;  Location: [facility] ENDO SU[facility];  Service: Endoscopy;  Laterality: N/A;  845   COLONOSCOPY N/A 06/17/2023   Procedure: COLONOSCOPY;  Surgeon: Hollingshead Lamar CHRISTELLA, MD;  Location: [facility];  Service: Endoscopy;  Laterality: N/A;   ENTEROSCOPY N/A 09/06/2023   Procedure: ENTEROSCOPY;  Surgeon: Eartha Angelia Sieving, MD;  Location: [facility];  Service: Gastroenterology;  Laterality: N/A;   ESOPHAGOGASTRODUODENOSCOPY N/A 05/26/2016   Procedure: ESOPHAGOGASTRODUODENOSCOPY (EGD);  Surgeon: Lamar CHRISTELLA Hollingshead, MD;  Location: [facility];  Service: Endoscopy;  Laterality: N/A;   ESOPHAGOGASTRODUODENOSCOPY N/A 06/16/2023   Procedure: EGD (ESOPHAGOGASTRODUODENOSCOPY);  Surgeon: Cinderella Deatrice FALCON, MD;  Location: [facility];  Service: Endoscopy;  Laterality: N/A;   ESOPHAGOGASTRODUODENOSCOPY N/A 09/06/2023   Procedure: EGD (ESOPHAGOGASTRODUODENOSCOPY);  Surgeon: Eartha Angelia, Sieving, MD;  Location: [facility];  Service: Gastroenterology;  Laterality: N/A;  with possible dilation   ESOPHAGOGASTRODUODENOSCOPY (EGD) WITH PROPOFOL  N/A 11/22/2022   Procedure: ESOPHAGOGASTRODUODENOSCOPY (EGD) WITH PROPOFOL ;  Surgeon: Cindie Carlin POUR, DO;  Location[facility];  Service: Endoscopy;  Laterality: N/A;   EYE SURGERY     EYE SURGERY Left    Dr. Loretha   HEMOSTASIS CLIP PLACEMENT  06/17/2023   Procedure: CONTROL OF HEMORRHAGE, GI TRACT, ENDOSCOPIC, BY CLIPPING OR OVERSEWING;  Surgeon:  Hollingshead Lamar CHRISTELLA, MD;  Location: [facility];  Service: Endoscopy;;   HOT HEMOSTASIS  11/22/2022   Procedure: HOT HEMOSTASIS (ARGON PLASMA COAGULATION/BICAP);  Surgeon: Cindie Carlin POUR, DO;  Location: [facility];  Service: Endoscopy;;   HOT HEMOSTASIS  06/16/2023   Procedure: EGD, WITH ARGON PLASMA COAGULATION;  Surgeon: Cinderella Deatrice FALCON, MD;  Location: [facility];  Service: Endoscopy;;   MALONEY DILATION N/A 05/26/2016   Procedure: AGAPITO DILATION;  Surgeon: Lamar CHRISTELLA Hollingshead, MD;  [facility];  Service: Endoscopy;  Laterality: N/A;   POLYPECTOMY  06/17/2023   Procedure: POLYPECTOMY;  Surgeon: Hollingshead Lamar CHRISTELLA, MD;  [facility];  Service: Endoscopy;;   SCLEROTHERAPY  06/17/2023   Procedure: MATIAS;  Surgeon: Hollingshead Lamar CHRISTELLA, MD;  Location[facility];  Service: Endoscopy;;   TEE WITHOUT CARDIOVERSION N/A 02/04/2023   Procedure: TRANSESOPHAGEAL ECHOCARDIOGRAM (TEE);  Surgeon: Mallipeddi, Vishnu P, MD;  Location: [facility];  Service: Cardiovascular;  Laterality: N/A;   TOOTH EXTRACTION N/A 01/15/2022   Procedure: DENTAL RESTORATION/EXTRACTIONS;  Surgeon: Sheryle Hamilton, DMD;  Location: [facility] OR;  Service: Oral Surgery;  Laterality: N/A;   VITRECTOMY Left 10/03/2019   Dr. Elner, Vitrectomy, Focal Laser,  Removal of Silicone Oil    Current Outpatient Medications  Medication Sig Dispense Refill   acetaminophen  (TYLENOL ) 500 MG tablet Take 1,000 mg by mouth every 8 (eight) hours as needed for mild pain or moderate pain.     albuterol  (PROVENTIL ) (2.5 MG/3ML) 0.083% nebulizer solution Take 2.5 mg by nebulization every 6 (six) hours as needed for shortness of breath or wheezing.     amiodarone  (PACERONE ) 200 MG tablet Take 1 Tablet ( 200 mg ) Daily and none on Sunday 90 tablet 1   amLODipine  (NORVASC ) 5 MG tablet Take 5 mg by mouth daily.     apixaban  (ELIQUIS ) 2.5 MG TABS tablet Take 1 tablet (2.5 mg total) by mouth 2 (two) times daily. 60 tablet 5   Cholecalciferol  (VITAMIN D3) 50 MCG (2000 UT) TABS Take 2,000 Units by mouth daily.     ferrous sulfate  324 MG TBEC Take 324 mg by mouth.     levothyroxine  (SYNTHROID ) 50 MCG tablet Take 50 mcg by mouth daily before breakfast.      linaclotide  (LINZESS ) 290 MCG CAPS capsule Take 1 capsule (290 mcg total) by mouth daily before breakfast. 30 capsule 1   nitroGLYCERIN  (NITROSTAT ) 0.4 MG SL tablet Place 1 tablet (0.4 mg total) under the tongue every 5 (five) minutes as needed for chest pain. 90 tablet 2   pantoprazole  (PROTONIX ) 40 MG tablet Take 1 tablet (40 mg total) by mouth 2 (two) times daily. 60 tablet 0   rosuvastatin  (CRESTOR ) 20 MG tablet Take 20 mg by mouth daily.     traZODone  (DESYREL ) 50 MG tablet Take 50 mg by mouth at bedtime.     TRELEGY ELLIPTA 200-62.5-25 MCG/ACT AEPB Inhale 1 puff into the lungs daily.     denosumab  (PROLIA ) 60 MG/ML SOSY injection INJECT 1 MILLILITER (60 MG) BY SUBCUTANEOUS ROUTE EVERY 6 MONTHS IN THE UPPER ARM, UPPER THIGH OR ABDOMEN (Patient not taking: Reported on 11/01/2023)     mirtazapine  (REMERON ) 15 MG tablet Take 7.5 mg by mouth at bedtime. (Patient not taking: Reported on 11/01/2023)     No current facility-administered medications for this visit.    Allergies as of 11/01/2023 - Review Complete 11/01/2023  Allergen Reaction Noted   Povidone-iodine Other (See Comments) 12/20/2022   Sulfa antibiotics Other (See Comments) 08/02/2013   Sulfamethoxazole Other (See Comments) 03/23/2017    Family History  Problem Relation Age of Onset   Heart disease Mother    COPD Father    Cancer Sister        unknown primary   Cancer Brother        unknown primary   Cancer Brother    Hypertension Son    Colon cancer Neg Hx     Social History   Socioeconomic History   Marital status: Divorced    Spouse name: Not on file   Number of children: 1   Years of education: Not on file   Highest education level: Not on file  Occupational History   Occupation: Retired  Tobacco  Use   Smoking status: Former    Current packs/day: 0.00    Types: Cigarettes    Quit date: 12/28/2015    Years since quitting: 7.8    Passive exposure: Current   Smokeless tobacco: Never   Tobacco comments:    Patient quit within the last 10 years  Vaping Use   Vaping status: Never Used  Substance and Sexual Activity   Alcohol use: Not Currently   Drug  use: Never   Sexual activity: Not Currently  Other Topics Concern   Not on file  Social History Narrative   ** Merged History Encounter **       Social Drivers of Health   Financial Resource Strain: Low Risk  (11/26/2022)   Overall Financial Resource Strain (CARDIA)    Difficulty of Paying Living Expenses: Not hard at all  Food Insecurity: No Food Insecurity (09/05/2023)   Hunger Vital Sign    Worried About Running Out of Food in the Last Year: Never true    Ran Out of Food in the Last Year: Never true  Transportation Needs: No Transportation Needs (09/05/2023)   PRAPARE - Administrator, Civil Service (Medical): No    Lack of Transportation (Non-Medical): No  Physical Activity: Not on file  Stress: No Stress Concern Present (11/26/2022)   [employer] of Occupational Health - Occupational Stress Questionnaire    Feeling of Stress : Not at all  Social Connections: Moderately Integrated (09/05/2023)   Social Connection and Isolation Panel    Frequency of Communication with Friends and Family: More than three times a week    Frequency of Social Gatherings with Friends and Family: More than three times a week    Attends Religious Services: 1 to 4 times per year    Active Member of [employer] or Organizations: Yes    Attends Banker Meetings: 1 to 4 times per year    Marital Status: Divorced  Catering manager Violence: Not At Risk (09/05/2023)   Humiliation, Afraid, Rape, and Kick questionnaire    Fear of Current or Ex-Partner: No    Emotionally Abused: No    Physically Abused: No    Sexually Abused: No      Review of Systems   Gen: Denies any fever, chills, fatigue, weight loss, lack of appetite.  CV: Denies chest pain, heart palpitations, peripheral edema, syncope.  Resp: Denies shortness of breath at rest or with exertion. Denies wheezing or cough.  GI: see HPI GU : Denies urinary burning, urinary frequency, urinary hesitancy MS: Denies joint pain, muscle weakness, cramps, or limitation of movement.  Derm: Denies rash, itching, dry skin Psych: Denies depression, anxiety, memory loss, and confusion Heme: Denies bruising, bleeding, and enlarged lymph nodes.   Physical Exam   BP 128/72   Pulse 60   Temp 97.8 F (36.6 C)   Ht 5' 7 (1.702 m)   Wt 142 lb 3.2 oz (64.5 kg)   BMI 22.27 kg/m  General:   Alert and oriented. Pleasant and cooperative. Well-nourished and well-developed.  Head:  Normocephalic and atraumatic. Eyes:  Without icterus Abdomen:  +BS, soft, non-tender and non-distended. No HSM noted. No guarding or rebound. No masses appreciated.  Rectal:  Deferred  Msk:  with slight kyphosis, uses walker Extremities:  Without edema. Neurologic:  Alert and  oriented x4;  grossly normal neurologically. Skin:  Intact without significant lesions or rashes. Psych:  Alert and cooperative. Normal mood and affect.   Assessment   Patricia Jackson is an 83 y.o. 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   Anemia due to chronic blood loss: Hgb while inpatient 8.9 last on  file, and most recently Hgb 10.9 on 7/11. She has received IV iron  as outpatient and just started oral iron . Notes stool is chronically dark on this, but she denies any signs/symptoms  of symptomatic anemia currently. Discussed possible capsule study, but we will hold off on this as she has had numerous etiologies to explain recurrent IDA and anemia requiring blood transfusions. I would like to refer her to Hematology so she can be closely followed, along with evaluation of thrombocytopenia. She will continue on oral iron  for now and may need octreotide as outpatient, which she is willing to do if recommended. We will plan on colonoscopy in September due to prior in March 2025 with poor prep, multiple large polyps and one 15x 30 mm polyp distal to IC valve with removal and clip placement, ink marked. Will need extended prep..  Constipation: doing well on Linzess  290 mcg daily.    PLAN    Continue PPI BID: will decrease to once daily at next appt Referral to Hematology due to IDA in setting of AVMs and thrombocytopenia May need Octreotide as outpatient: if further drops in Hgb, will pursue this Colonoscopy in September with extended prep Close follow-up in about 4 weeks   Therisa MICAEL Stager, PhD, ANP-BC [facility]

## 2023-11-03 ENCOUNTER — Encounter: Payer: Self-pay | Admitting: Gastroenterology

## 2023-11-04 DIAGNOSIS — K219 Gastro-esophageal reflux disease without esophagitis: Secondary | ICD-10-CM | POA: Diagnosis not present

## 2023-11-04 DIAGNOSIS — I4891 Unspecified atrial fibrillation: Secondary | ICD-10-CM | POA: Diagnosis not present

## 2023-11-04 DIAGNOSIS — E782 Mixed hyperlipidemia: Secondary | ICD-10-CM | POA: Diagnosis not present

## 2023-11-04 DIAGNOSIS — I10 Essential (primary) hypertension: Secondary | ICD-10-CM | POA: Diagnosis not present

## 2023-11-06 ENCOUNTER — Encounter (HOSPITAL_COMMUNITY): Payer: Self-pay

## 2023-11-06 ENCOUNTER — Observation Stay (HOSPITAL_COMMUNITY)
Admission: EM | Admit: 2023-11-06 | Discharge: 2023-11-07 | Disposition: A | Discharge status: Home / Self Care | Source: Other Acute Inpatient Hospital | Attending: Cardiovascular Disease | Admitting: Cardiovascular Disease

## 2023-11-06 DIAGNOSIS — I4892 Unspecified atrial flutter: Secondary | ICD-10-CM | POA: Insufficient documentation

## 2023-11-06 DIAGNOSIS — J432 Centrilobular emphysema: Secondary | ICD-10-CM | POA: Diagnosis not present

## 2023-11-06 DIAGNOSIS — I4891 Unspecified atrial fibrillation: Secondary | ICD-10-CM | POA: Diagnosis not present

## 2023-11-06 DIAGNOSIS — J449 Chronic obstructive pulmonary disease, unspecified: Secondary | ICD-10-CM | POA: Diagnosis not present

## 2023-11-06 DIAGNOSIS — M549 Dorsalgia, unspecified: Secondary | ICD-10-CM | POA: Diagnosis not present

## 2023-11-06 DIAGNOSIS — I214 Non-ST elevation (NSTEMI) myocardial infarction: Secondary | ICD-10-CM | POA: Diagnosis not present

## 2023-11-06 DIAGNOSIS — I10 Essential (primary) hypertension: Secondary | ICD-10-CM | POA: Insufficient documentation

## 2023-11-06 DIAGNOSIS — R918 Other nonspecific abnormal finding of lung field: Secondary | ICD-10-CM | POA: Insufficient documentation

## 2023-11-06 DIAGNOSIS — Z7982 Long term (current) use of aspirin: Secondary | ICD-10-CM | POA: Diagnosis not present

## 2023-11-06 DIAGNOSIS — Z87891 Personal history of nicotine dependence: Secondary | ICD-10-CM | POA: Diagnosis not present

## 2023-11-06 DIAGNOSIS — E039 Hypothyroidism, unspecified: Secondary | ICD-10-CM | POA: Diagnosis not present

## 2023-11-06 DIAGNOSIS — I2510 Atherosclerotic heart disease of native coronary artery without angina pectoris: Secondary | ICD-10-CM | POA: Insufficient documentation

## 2023-11-06 DIAGNOSIS — R079 Chest pain, unspecified: Secondary | ICD-10-CM | POA: Diagnosis not present

## 2023-11-06 DIAGNOSIS — E785 Hyperlipidemia, unspecified: Secondary | ICD-10-CM | POA: Insufficient documentation

## 2023-11-06 DIAGNOSIS — R7989 Other specified abnormal findings of blood chemistry: Secondary | ICD-10-CM | POA: Diagnosis not present

## 2023-11-06 DIAGNOSIS — D509 Iron deficiency anemia, unspecified: Secondary | ICD-10-CM | POA: Insufficient documentation

## 2023-11-06 DIAGNOSIS — Z7901 Long term (current) use of anticoagulants: Secondary | ICD-10-CM | POA: Diagnosis not present

## 2023-11-06 DIAGNOSIS — D696 Thrombocytopenia, unspecified: Secondary | ICD-10-CM | POA: Diagnosis not present

## 2023-11-06 LAB — CBC WITH DIFFERENTIAL/PLATELET
Abs Immature Granulocytes: 0.03 K/uL (ref 0.00–0.07)
Basophils Absolute: 0 K/uL (ref 0.0–0.1)
Basophils Relative: 0 %
Eosinophils Absolute: 0.1 K/uL (ref 0.0–0.5)
Eosinophils Relative: 2 %
HCT: 38.7 % (ref 36.0–46.0)
Hemoglobin: 12.2 g/dL (ref 12.0–15.0)
Immature Granulocytes: 1 %
Lymphocytes Relative: 21 %
Lymphs Abs: 0.8 K/uL (ref 0.7–4.0)
MCH: 29.3 pg (ref 26.0–34.0)
MCHC: 31.5 g/dL (ref 30.0–36.0)
MCV: 93 fL (ref 80.0–100.0)
Monocytes Absolute: 0.4 K/uL (ref 0.1–1.0)
Monocytes Relative: 10 %
Neutro Abs: 2.7 K/uL (ref 1.7–7.7)
Neutrophils Relative %: 66 %
Platelets: 76 K/uL — ABNORMAL LOW (ref 150–400)
RBC: 4.16 MIL/uL (ref 3.87–5.11)
RDW: 19.5 % — ABNORMAL HIGH (ref 11.5–15.5)
WBC: 4 K/uL (ref 4.0–10.5)
nRBC: 0 % (ref 0.0–0.2)

## 2023-11-06 LAB — MAGNESIUM: Magnesium: 2.3 mg/dL (ref 1.7–2.4)

## 2023-11-06 LAB — LIPID PANEL
Cholesterol: 153 mg/dL (ref 0–200)
HDL: 70 mg/dL (ref >40)
LDL Cholesterol: 73 mg/dL (ref 0–99)
Total CHOL/HDL Ratio: 2.2 ratio
Triglycerides: 51 mg/dL (ref <150)
VLDL: 10 mg/dL (ref 0–40)

## 2023-11-06 LAB — BASIC METABOLIC PANEL WITH GFR
Anion gap: 10 (ref 5–15)
BUN: 27 mg/dL — ABNORMAL HIGH (ref 8–23)
CO2: 19 mmol/L — ABNORMAL LOW (ref 22–32)
Calcium: 9 mg/dL (ref 8.9–10.3)
Chloride: 113 mmol/L — ABNORMAL HIGH (ref 98–111)
Creatinine, Ser: 1.87 mg/dL — ABNORMAL HIGH (ref 0.44–1.00)
GFR, Estimated: 27 mL/min — ABNORMAL LOW (ref >60)
Glucose, Bld: 96 mg/dL (ref 70–99)
Potassium: 4.6 mmol/L (ref 3.5–5.1)
Sodium: 142 mmol/L (ref 135–145)

## 2023-11-06 LAB — TSH: TSH: 8.268 u[IU]/mL — ABNORMAL HIGH (ref 0.350–4.500)

## 2023-11-06 LAB — TROPONIN I (HIGH SENSITIVITY): Troponin I (High Sensitivity): 29 ng/L — ABNORMAL HIGH (ref <18)

## 2023-11-06 MED ORDER — ASPIRIN 81 MG PO TBEC
81.0000 mg | DELAYED_RELEASE_TABLET | Freq: Every day | ORAL | Status: DC
  Administered 2023-11-07: 81 mg via ORAL
  Filled 2023-11-06: qty 1

## 2023-11-06 MED ORDER — ACETAMINOPHEN 325 MG PO TABS
650.0000 mg | ORAL_TABLET | ORAL | Status: DC | PRN
Start: 2023-11-06 — End: 2023-11-07

## 2023-11-06 MED ORDER — ONDANSETRON HCL 4 MG/2ML IJ SOLN: 4.0000 mg | Freq: Four times a day (QID) | INTRAMUSCULAR | Status: DC | PRN

## 2023-11-06 MED ORDER — HEPARIN (PORCINE) 25000 UT/250ML-% IV SOLN
850.0000 [IU]/h | INTRAVENOUS | Status: DC
  Administered 2023-11-06: 850 [IU]/h via INTRAVENOUS
  Filled 2023-11-06: qty 250

## 2023-11-06 MED ORDER — MELATONIN 3 MG PO TABS: 6.0000 mg | ORAL_TABLET | Freq: Every evening | ORAL | Status: DC | PRN

## 2023-11-06 MED ORDER — NITROGLYCERIN 0.4 MG SL SUBL: 0.4000 mg | SUBLINGUAL_TABLET | SUBLINGUAL | Status: DC | PRN

## 2023-11-06 NOTE — ED Triage Notes (Signed)
 Patient c/o bilateral arm and upper back pain with sudden onset about 45 min ago. Patient reports the pain has began to subside from 10/10 to 3/10. Patient reports she took nitroglycerin  x 2 at home but did not help the pain. Hx afib, stent placement, anemia.

## 2023-11-06 NOTE — H&P (Incomplete)
 Cardiology Admission History and Physical   Patient ID: Patricia Jackson MRN: [id number]; DOB: May 18, 1940   Admission date: 11/06/2023  PCP:  Shona Norleen PEDLAR, MD   [facility] Providers Cardiologist:  Alvan Carrier, MD  Electrophysiologist:  Danelle Birmingham, MD  { Click here to update MD or APP on Care Team, Refresh:1}    Chief Complaint:  nstemi  History of Present Illness:  Samary Jackson is a 83 y.o. 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  Patient presented to [facility] today with generalized weakness and was accepted in transfer for NSTEMI.   Patient c/o bilateral arm and upper back pain with sudden onset about 45 min ago. Patient reports the pain has began to subside from 10/10 to 3/10. Patient reports she took nitroglycerin  x 2 at home but did not help the pain. Hx afib, stent placement, anemia.  CTA Chest 11/06/23: 1. No acute aortic dissection or mural hematoma. No aneurysm. Extensive calcified and noncalcified atherosclerosis. 2. Focal groundglass and tree-in-bud nodular attenuation of the right middle lobe and inferior lingular segment, which may represent infectious or inflammatory bronchiolitis. 3. Upper lobe predominant paraseptal and centrilobular emphysema. Diffuse intralobular septal thickening, which may represent interstitial edema.  Heart: Moderate coronary artery calcifications.  CXR 11/06/23: No radiographic evidence of acute cardiopulmonary process.    Labs: WBC 3.7, Hgb 12.8 (RDW 19.5), plt 83, K 4.6, CL 110, CO2 21,  Bun 29, Cr 1.22, Trop 13 -> 38  #AF/AFL --continue home amidoarone 200mg  every day --continue home apixaban  2.5mg  bid  #HTN --continue home amlodipine  5mg  every day  #HLD --continue home rosuvastatin  20mg  qd  #IDA #GI --continue home iron  324mg  every day  Chronic Conditions #Hypothyroidism: continue home levothyroxine  50mcg every day #Sleep: continue home mirtazapine  7.5mg  at bedtime, trazodone  50mg  at bedtime #COPD: continue home trelegy ellipta (formulary altenrative), goal sat 88-92%      Past Medical History:  Diagnosis Date   A-fib [facility])    Atrial flutter (HCC)    On Eliquis  in 8/17 but discontinued after stent placement   Breast cancer (HCC)    remote   CAD in native artery 03/16/2016   COPD (chronic obstructive pulmonary disease) (HCC)    Dysrhythmia    Hypertension    Iron  deficiency anemia 06/12/2021   NSTEMI (non-ST elevated myocardial infarction) (HCC)    2017   S/P angioplasty with stent 03/15/16 DES Resolute, pLCX 03/16/2016   Vitreous hemorrhage of left eye (HCC) 08/01/2019    Past Surgical History:  Procedure Laterality Date   APPENDECTOMY     BREAST SURGERY Left    CARDIAC CATHETERIZATION N/A 03/15/2016   Procedure: Left Heart Cath and Coronary Angiography;  Surgeon: Patricia LELON Sharps, MD;  Location: [facility] INVASIVE CV LAB;  Service: Cardiovascular;  Laterality: N/A;   CARDIAC CATHETERIZATION N/A 03/15/2016   Procedure: Coronary Stent Intervention;  Surgeon: Patricia LELON Sharps, MD;  Location: [facility] INVASIVE CV LAB;  Service: Cardiovascular;  Laterality: N/A;   CARDIOVERSION N/A 02/04/2023   Procedure: CARDIOVERSION;  Surgeon: Stacia Diannah SQUIBB, MD;  Location: [facility] ORS;  Service: Cardiovascular;  Laterality: N/A;  COLONOSCOPY     remote   COLONOSCOPY N/A 05/26/2016   Procedure: COLONOSCOPY;  Surgeon: Patricia CHRISTELLA Hollingshead, MD;  Location: [facility] ENDO [facility];  Service: Endoscopy;  Laterality: N/A;  845   COLONOSCOPY N/A 06/17/2023   Procedure: COLONOSCOPY;  Surgeon: Jackson Patricia CHRISTELLA, MD;  Location: [facility];  Service: Endoscopy;  Laterality: N/A;   ENTEROSCOPY N/A 09/06/2023   Procedure: ENTEROSCOPY;  Surgeon: Patricia Angelia Sieving, MD;  [facility];  Service: Gastroenterology;  Laterality: N/A;   ESOPHAGOGASTRODUODENOSCOPY N/A 05/26/2016   Procedure: ESOPHAGOGASTRODUODENOSCOPY (EGD);  Surgeon: Patricia CHRISTELLA Hollingshead, MD;  [facility];  Service: Endoscopy;  Laterality: N/A;   ESOPHAGOGASTRODUODENOSCOPY N/A 06/16/2023   Procedure: EGD (ESOPHAGOGASTRODUODENOSCOPY);  Surgeon: Patricia Deatrice FALCON, MD;  [facility];  Service: Endoscopy;  Laterality: N/A;   ESOPHAGOGASTRODUODENOSCOPY N/A 09/06/2023   Procedure: EGD (ESOPHAGOGASTRODUODENOSCOPY);  Surgeon: Patricia Angelia, Sieving, MD;  [facility];  Service: Gastroenterology;  Laterality: N/A;  with possible dilation   ESOPHAGOGASTRODUODENOSCOPY (EGD) WITH PROPOFOL  N/A 11/22/2022   Procedure: ESOPHAGOGASTRODUODENOSCOPY (EGD) WITH PROPOFOL ;  Surgeon: Patricia Carlin POUR, DO;  [facility];  Service: Endoscopy;  Laterality: N/A;   EYE SURGERY     EYE SURGERY Left    Dr. Loretha   HEMOSTASIS CLIP PLACEMENT  06/17/2023   Procedure: CONTROL OF HEMORRHAGE, GI TRACT, ENDOSCOPIC, BY CLIPPING OR OVERSEWING;  Surgeon: Jackson Patricia CHRISTELLA, MD;  [facility];  Service: Endoscopy;;   HOT HEMOSTASIS  11/22/2022   Procedure: HOT HEMOSTASIS (ARGON PLASMA COAGULATION/BICAP);  Surgeon: Patricia Carlin POUR, DO;  [facility];  Service: Endoscopy;;   HOT HEMOSTASIS  06/16/2023   Procedure: EGD, WITH ARGON PLASMA COAGULATION;  Surgeon: Patricia Deatrice FALCON, MD;  [facility];  Service: Endoscopy;;   MALONEY DILATION N/A 05/26/2016   Procedure: AGAPITO DILATION;  Surgeon: Patricia CHRISTELLA Hollingshead, MD;  [facility];  Service: Endoscopy;  Laterality: N/A;   POLYPECTOMY  06/17/2023   Procedure: POLYPECTOMY;  Surgeon: Jackson Patricia CHRISTELLA, MD;  [facility];  Service:  Endoscopy;;   SCLEROTHERAPY  06/17/2023   Procedure: MATIAS;  Surgeon: Jackson Patricia CHRISTELLA, MD;  [facility];  Service: Endoscopy;;   TEE WITHOUT CARDIOVERSION N/A 02/04/2023   Procedure: TRANSESOPHAGEAL ECHOCARDIOGRAM (TEE);  Surgeon: Mallipeddi, Vishnu P, MD;  Location: [facility];  Service: Cardiovascular;  Laterality: N/A;   TOOTH EXTRACTION N/A 01/15/2022   Procedure: DENTAL RESTORATION/EXTRACTIONS;  Surgeon: Sheryle Hamilton, DMD;  Location: [facility] OR;  Service: Oral Surgery;  Laterality: N/A;   VITRECTOMY Left 10/03/2019   Dr. Elner, Vitrectomy, Focal Laser, Removal of Silicone Oil     Medications Prior to Admission: Prior to Admission medications   Medication Sig Start Date End Date Taking? Authorizing Provider  acetaminophen  (TYLENOL ) 500 MG tablet Take 1,000 mg by mouth every 8 (eight) hours as needed for mild pain or moderate pain. 03/21/17   [provider]  albuterol  (PROVENTIL ) (2.5 MG/3ML) 0.083% nebulizer solution Take 2.5 mg by nebulization every 6 (six) hours as needed for shortness of breath or wheezing.    [provider]  amiodarone  (PACERONE ) 200 MG tablet Take 1 Tablet ( 200 mg ) Daily and none on Sunday 10/25/23   Waddell Danelle ORN, MD  amLODipine  (NORVASC ) 5 MG tablet Take 5 mg by mouth daily. 08/15/23   [provider]  apixaban  (ELIQUIS ) 2.5 MG TABS tablet Take 1 tablet (2.5 mg total) by mouth 2 (two)  times daily. 10/25/23   Alvan Dorn FALCON, MD  Cholecalciferol (VITAMIN D3) 50 MCG (2000 UT) TABS Take 2,000 Units by mouth daily.    [provider]  denosumab  (PROLIA ) 60 MG/ML SOSY injection INJECT 1 MILLILITER (60 MG) BY SUBCUTANEOUS ROUTE EVERY 6 MONTHS IN THE UPPER ARM, UPPER THIGH OR ABDOMEN Patient not taking: Reported on 11/01/2023    [provider]  ferrous sulfate  324 MG TBEC Take 324 mg by mouth.    [provider]  levothyroxine  (SYNTHROID ) 50 MCG tablet Take 50 mcg by mouth daily before breakfast.   09/20/19   [provider]  linaclotide  (LINZESS ) 290 MCG CAPS capsule Take 1 capsule (290 mcg total) by mouth daily before breakfast. 06/19/23   Ricky Fines, MD  mirtazapine  (REMERON ) 15 MG tablet Take 7.5 mg by mouth at bedtime. Patient not taking: Reported on 11/01/2023 08/11/23   [provider]  nitroGLYCERIN  (NITROSTAT ) 0.4 MG SL tablet Place 1 tablet (0.4 mg total) under the tongue every 5 (five) minutes as needed for chest pain. 10/14/23   Alvan Dorn FALCON, MD  pantoprazole  (PROTONIX ) 40 MG tablet Take 1 tablet (40 mg total) by mouth 2 (two) times daily. 09/12/23 11/01/23  Maree, Pratik D, DO  rosuvastatin  (CRESTOR ) 20 MG tablet Take 20 mg by mouth daily. 02/26/17   [provider]  traZODone  (DESYREL ) 50 MG tablet Take 50 mg by mouth at bedtime. 08/31/23   [provider]  DOMINIC BECK 200-62.5-25 MCG/ACT AEPB Inhale 1 puff into the lungs daily. 11/02/22   [provider]     Allergies:    Allergies  Allergen Reactions   Povidone-Iodine Other (See Comments)    Burning   Sulfa Antibiotics Other (See Comments)    Unknown    Sulfamethoxazole Other (See Comments)    Unknown     Social History:   Social History   Socioeconomic History   Marital status: Divorced    Spouse name: Not on file   Number of children: 1   Years of education: Not on file   Highest education level: Not on file  Occupational History   Occupation: Retired  Tobacco Use   Smoking status: Former    Current packs/day: 0.00    Types: Cigarettes    Quit date: 12/28/2015    Years since quitting: 7.8    Passive exposure: Current   Smokeless tobacco: Never   Tobacco comments:    Patient quit within the last 10 years  Vaping Use   Vaping status: Never Used  Substance and Sexual Activity   Alcohol use: Not Currently   Drug use: Never   Sexual activity: Not Currently  Other Topics Concern   Not on file  Social History Narrative   ** Merged History Encounter **        Social Drivers of Health   Financial Resource Strain: Low Risk  (11/26/2022)   Overall Financial Resource Strain (CARDIA)    Difficulty of Paying Living Expenses: Not hard at all  Food Insecurity: No Food Insecurity (09/05/2023)   Hunger Vital Sign    Worried About Running Out of Food in the Last Year: Never true    Ran Out of Food in the Last Year: Never true  Transportation Needs: No Transportation Needs (09/05/2023)   PRAPARE - Administrator, Civil Service (Medical): No    Lack of Transportation (Non-Medical): No  Physical Activity: Not on file  Stress: No Stress Concern Present (11/26/2022)   [place]  Institute of Occupational Health - Occupational Stress Questionnaire    Feeling of Stress : Not at all  Social Connections: Moderately Integrated (09/05/2023)   Social Connection and Isolation Panel    Frequency of Communication with Friends and Family: More than three times a week    Frequency of Social Gatherings with Friends and Family: More than three times a week    Attends Religious Services: 1 to 4 times per year    Active Member of [employer] or Organizations: Yes    Attends Banker Meetings: 1 to 4 times per year    Marital Status: Divorced  Catering manager Violence: Not At Risk (09/05/2023)   Humiliation, Afraid, Rape, and Kick questionnaire    Fear of Current or Ex-Partner: No    Emotionally Abused: No    Physically Abused: No    Sexually Abused: No    Family History:  *** The patient's family history includes COPD in her father; Cancer in her brother, brother, and sister; Heart disease in her mother; Hypertension in her son. There is no history of Colon cancer.    ROS:  Please see the history of present illness.  ***All other ROS reviewed and negative.     Physical Exam/Data:   Vitals:   11/06/23 1955 11/06/23 2000  BP:  (!) 134/58  Pulse:  (!) 50  Resp:  14  Temp:  97.6 F (36.4 C)  TempSrc:  Oral  SpO2:  96%  Weight: 62.3 kg    Height: 5' 7 (1.702 m)    No intake or output data in the 24 hours ending 11/06/23 2115    11/06/2023    7:55 PM 11/01/2023    9:28 AM 10/05/2023    1:41 PM  Last 3 Weights  Weight (lbs) 137 lb 5.6 oz 142 lb 3.2 oz 142 lb  Weight (kg) 62.3 kg 64.501 kg 64.411 kg     Body mass index is 21.51 kg/m.  General:  Well nourished, well developed, in no acute distress HEENT: normal Neck: no JVD Vascular: No carotid bruits; Distal pulses 2+ bilaterally   Cardiac:  normal S1, S2; RRR; no murmur  Lungs:  clear to auscultation bilaterally, no wheezing, rhonchi or rales  Abd: soft, nontender, no hepatomegaly  Ext: no edema Skin: warm and dry  Neuro:  no focal abnormalities noted Psych:  Normal affect    EKG:  The ECG that was done  was personally reviewed and demonstrates   Relevant CV Studies: TTE 09/13/22  1. Left ventricular ejection fraction, by estimation, is 60 to 65%. The left ventricle has normal function. The left ventricle has no regional wall motion abnormalities. There is mild left ventricular hypertrophy. Left ventricular diastolic parameters are indeterminate. Elevated left atrial pressure.   2. Right ventricular systolic function is normal. The right ventricular size is normal.   3. The mitral valve is abnormal. Mild mitral valve regurgitation. No evidence of mitral stenosis.   4. The aortic valve is tricuspid. There is mild calcification of the aortic valve. There is mild thickening of the aortic valve. Aortic valve regurgitation is not visualized. No aortic stenosis is present.   5. The inferior vena cava is normal in size with greater than 50% respiratory variability, uggesting right atrial pressure of 3 mmHg.    Laboratory Data:  High Sensitivity Troponin:  No results for input(s): TROPONINIHS in the last 720 hours.    ChemistryNo results for input(s): NA, K, CL, CO2, GLUCOSE, BUN, CREATININE, CALCIUM , MG, GFRNONAA,  GFRAA, ANIONGAP in the last 168  hours.  No results for input(s): PROT, ALBUMIN , AST, ALT, ALKPHOS, BILITOT in the last 168 hours. Lipids No results for input(s): CHOL, TRIG, HDL, LABVLDL, LDLCALC, CHOLHDL in the last 168 hours. HematologyNo results for input(s): WBC, RBC, HGB, HCT, MCV, MCH, MCHC, RDW, PLT in the last 168 hours. Thyroid  No results for input(s): TSH, FREET4 in the last 168 hours. BNPNo results for input(s): BNP, PROBNP in the last 168 hours.  DDimer No results for input(s): DDIMER in the last 168 hours.   Radiology/Studies:  No results found.   Assessment and Plan:   #NSTEMI --load asa 325mg  then 81mg  every day --continue heparin  gtt --start atorvastatin  80mg  every day --trend troponin to peak  --obtain TTE --may consider LHC pending clinical course and TTE Risk stratification --A1c, TSH, Lipid panel Monitoring --chest pain protocol; po nitroglycerin  and EKG prn chest pain --maintain telemetry --goal K~4, Mag~2   Risk Assessment/Risk Scores:  {Complete the following score calculators/questions to meet required metrics.  Press F2:1}  {Is the patient being seen for unstable angina, ACS, NSTEMI or STEMI?:[phone]} {Does this patient have CHF or CHF symptoms?      :[phone]} {Does this patient have ATRIAL FIBRILLATION?[phone]}  Code Status: {Please document patient code status       :29822}  Severity of Illness: {Observation/Inpatient:21159}   For questions or updates, please contact [facility] Please consult [email or web address] for contact info under     Signed, Earlene CHRISTELLA Cluster, MD  11/06/2023 9:15 PM

## 2023-11-06 NOTE — Progress Notes (Signed)
 PHARMACY - ANTICOAGULATION CONSULT NOTE  Pharmacy Consult for heparin  Indication: chest pain/ACS  Allergies  Allergen Reactions   Povidone-Iodine Other (See Comments)    Burning   Sulfa Antibiotics Other (See Comments)    Unknown    Sulfamethoxazole Other (See Comments)    Unknown     Patient Measurements: Height: 5' 7 (170.2 cm) Weight: 62.3 kg (137 lb 5.6 oz) IBW/kg (Calculated) : 61.6 HEPARIN  DW (KG): 62.3  Vital Signs: Temp: 97.6 F (36.4 C) (08/03 2000) Temp Source: Oral (08/03 2000) BP: 134/58 (08/03 2000) Pulse Rate: 50 (08/03 2000)  Labs: No results for input(s): HGB, HCT, PLT, APTT, LABPROT, INR, HEPARINUNFRC, HEPRLOWMOCWT, CREATININE, CKTOTAL, CKMB, TROPONINIHS in the last 72 hours.  CrCl cannot be calculated (Patient's most recent lab result is older than the maximum 21 days allowed.).   Medical History: Past Medical History:  Diagnosis Date   A-fib [facility])    Atrial flutter (HCC)    On Eliquis  in 8/17 but discontinued after stent placement   Breast cancer (HCC)    remote   CAD in native artery 03/16/2016   COPD (chronic obstructive pulmonary disease) (HCC)    Dysrhythmia    Hypertension    Iron  deficiency anemia 06/12/2021   NSTEMI (non-ST elevated myocardial infarction) (HCC)    2017   S/P angioplasty with stent 03/15/16 DES Resolute, pLCX 03/16/2016   Vitreous hemorrhage of left eye (HCC) 08/01/2019      Assessment: 82 yoM transferred from OSH for cardiac evaluation. Pt on apixaban  PTA for AF, last dose was 8/1. Pharmacy to dose IV heparin .  Unclear if heparin  ever started at OSH (ordered for 1030 units/h), not currently infusing at [facility]. Will start without bolus. CBC ok at OSH today.  Goal of Therapy:  Heparin  level 0.3-0.7 units/ml aPTT 66-102 seconds Monitor platelets by anticoagulation protocol: Yes   Plan:  Heparin  850 units/h Check aPTT and heparin  level in 6h  Ozell Jamaica, PharmD, [place], [facility] Clinical  Pharmacist [phone] Please check AMION for all [facility] Pharmacy numbers 11/06/2023

## 2023-11-07 ENCOUNTER — Other Ambulatory Visit: Payer: Self-pay | Admitting: Cardiology

## 2023-11-07 ENCOUNTER — Other Ambulatory Visit (HOSPITAL_COMMUNITY)

## 2023-11-07 DIAGNOSIS — I2510 Atherosclerotic heart disease of native coronary artery without angina pectoris: Secondary | ICD-10-CM | POA: Diagnosis not present

## 2023-11-07 DIAGNOSIS — I10 Essential (primary) hypertension: Secondary | ICD-10-CM | POA: Diagnosis not present

## 2023-11-07 DIAGNOSIS — R918 Other nonspecific abnormal finding of lung field: Secondary | ICD-10-CM | POA: Diagnosis not present

## 2023-11-07 DIAGNOSIS — J449 Chronic obstructive pulmonary disease, unspecified: Secondary | ICD-10-CM | POA: Diagnosis not present

## 2023-11-07 DIAGNOSIS — I4892 Unspecified atrial flutter: Secondary | ICD-10-CM | POA: Diagnosis not present

## 2023-11-07 DIAGNOSIS — Z87891 Personal history of nicotine dependence: Secondary | ICD-10-CM | POA: Diagnosis not present

## 2023-11-07 DIAGNOSIS — R7989 Other specified abnormal findings of blood chemistry: Secondary | ICD-10-CM | POA: Diagnosis not present

## 2023-11-07 DIAGNOSIS — D509 Iron deficiency anemia, unspecified: Secondary | ICD-10-CM | POA: Diagnosis not present

## 2023-11-07 DIAGNOSIS — E039 Hypothyroidism, unspecified: Secondary | ICD-10-CM | POA: Diagnosis not present

## 2023-11-07 DIAGNOSIS — Z7982 Long term (current) use of aspirin: Secondary | ICD-10-CM | POA: Diagnosis not present

## 2023-11-07 DIAGNOSIS — M549 Dorsalgia, unspecified: Secondary | ICD-10-CM | POA: Diagnosis not present

## 2023-11-07 DIAGNOSIS — E785 Hyperlipidemia, unspecified: Secondary | ICD-10-CM | POA: Diagnosis not present

## 2023-11-07 LAB — TROPONIN I (HIGH SENSITIVITY): Troponin I (High Sensitivity): 26 ng/L — ABNORMAL HIGH (ref <18)

## 2023-11-07 LAB — CBC WITH DIFFERENTIAL/PLATELET
Abs Immature Granulocytes: 0.01 K/uL (ref 0.00–0.07)
Basophils Absolute: 0 K/uL (ref 0.0–0.1)
Basophils Relative: 1 %
Eosinophils Absolute: 0.1 K/uL (ref 0.0–0.5)
Eosinophils Relative: 3 %
HCT: 39.1 % (ref 36.0–46.0)
Hemoglobin: 12.2 g/dL (ref 12.0–15.0)
Immature Granulocytes: 0 %
Lymphocytes Relative: 26 %
Lymphs Abs: 1 K/uL (ref 0.7–4.0)
MCH: 29.3 pg (ref 26.0–34.0)
MCHC: 31.2 g/dL (ref 30.0–36.0)
MCV: 94 fL (ref 80.0–100.0)
Monocytes Absolute: 0.4 K/uL (ref 0.1–1.0)
Monocytes Relative: 10 %
Neutro Abs: 2.2 K/uL (ref 1.7–7.7)
Neutrophils Relative %: 60 %
Platelets: 76 K/uL — ABNORMAL LOW (ref 150–400)
RBC: 4.16 MIL/uL (ref 3.87–5.11)
RDW: 19.7 % — ABNORMAL HIGH (ref 11.5–15.5)
WBC: 3.7 K/uL — ABNORMAL LOW (ref 4.0–10.5)
nRBC: 0 % (ref 0.0–0.2)

## 2023-11-07 LAB — BASIC METABOLIC PANEL WITH GFR
Anion gap: 9 (ref 5–15)
BUN: 28 mg/dL — ABNORMAL HIGH (ref 8–23)
CO2: 18 mmol/L — ABNORMAL LOW (ref 22–32)
Calcium: 8.9 mg/dL (ref 8.9–10.3)
Chloride: 114 mmol/L — ABNORMAL HIGH (ref 98–111)
Creatinine, Ser: 1.76 mg/dL — ABNORMAL HIGH (ref 0.44–1.00)
GFR, Estimated: 29 mL/min — ABNORMAL LOW (ref >60)
Glucose, Bld: 87 mg/dL (ref 70–99)
Potassium: 4.3 mmol/L (ref 3.5–5.1)
Sodium: 141 mmol/L (ref 135–145)

## 2023-11-07 LAB — MAGNESIUM: Magnesium: 2.2 mg/dL (ref 1.7–2.4)

## 2023-11-07 MED ORDER — LEVOTHYROXINE SODIUM 50 MCG PO TABS
50.0000 ug | ORAL_TABLET | Freq: Every day | ORAL | Status: DC
  Administered 2023-11-07: 50 ug via ORAL
  Filled 2023-11-07: qty 1

## 2023-11-07 MED ORDER — AMIODARONE HCL 200 MG PO TABS
200.0000 mg | ORAL_TABLET | ORAL | Status: DC
  Administered 2023-11-07: 200 mg via ORAL
  Filled 2023-11-07: qty 1

## 2023-11-07 MED ORDER — PANTOPRAZOLE SODIUM 40 MG PO TBEC
40.0000 mg | DELAYED_RELEASE_TABLET | Freq: Two times a day (BID) | ORAL | Status: DC
  Administered 2023-11-07 (×2): 40 mg via ORAL
  Filled 2023-11-07 (×2): qty 1

## 2023-11-07 MED ORDER — AMLODIPINE BESYLATE 5 MG PO TABS
5.0000 mg | ORAL_TABLET | Freq: Every day | ORAL | Status: DC
  Administered 2023-11-07: 5 mg via ORAL
  Filled 2023-11-07: qty 1

## 2023-11-07 MED ORDER — TRAZODONE HCL 50 MG PO TABS
50.0000 mg | ORAL_TABLET | Freq: Every day | ORAL | Status: DC
  Administered 2023-11-07: 50 mg via ORAL
  Filled 2023-11-07: qty 1

## 2023-11-07 MED ORDER — ALBUTEROL SULFATE (2.5 MG/3ML) 0.083% IN NEBU: 2.5000 mg | INHALATION_SOLUTION | Freq: Four times a day (QID) | RESPIRATORY_TRACT | Status: DC | PRN

## 2023-11-07 MED ORDER — ROSUVASTATIN CALCIUM 20 MG PO TABS
20.0000 mg | ORAL_TABLET | Freq: Every day | ORAL | Status: DC
  Administered 2023-11-07: 20 mg via ORAL
  Filled 2023-11-07: qty 1

## 2023-11-07 MED ORDER — LEVOTHYROXINE SODIUM 75 MCG PO TABS: 75.0000 ug | ORAL_TABLET | Freq: Every day | ORAL | Status: DC

## 2023-11-07 MED ORDER — BUDESON-GLYCOPYRROL-FORMOTEROL 160-9-4.8 MCG/ACT IN AERO
2.0000 | INHALATION_SPRAY | Freq: Two times a day (BID) | RESPIRATORY_TRACT | Status: DC
  Filled 2023-11-07: qty 5.9

## 2023-11-07 MED ORDER — LINACLOTIDE 145 MCG PO CAPS
290.0000 ug | ORAL_CAPSULE | Freq: Every day | ORAL | Status: DC
  Administered 2023-11-07: 290 ug via ORAL
  Filled 2023-11-07: qty 2

## 2023-11-07 MED ORDER — ASPIRIN 81 MG PO TBEC: 81.0000 mg | DELAYED_RELEASE_TABLET | Freq: Every day | ORAL | Status: DC

## 2023-11-07 MED ORDER — FERROUS SULFATE 325 (65 FE) MG PO TABS
325.0000 mg | ORAL_TABLET | Freq: Every day | ORAL | Status: DC
  Administered 2023-11-07: 325 mg via ORAL
  Filled 2023-11-07: qty 1

## 2023-11-07 NOTE — Evaluation (Signed)
 Physical Therapy Evaluation Patient Details Name: Patricia Jackson MRN: [id number] DOB: December 17, 1940 Today's Date: 11/07/2023  History of Present Illness  The pt is an 83 yo female presenting 8/3 with weakness and reports of back pain radiating to bilateral arms. Pt transferred to [facility] for NSTEMI management. PMH includes: afib, a flutter on Eliquis , breast cancer, CAD, COPD, HTN, NSTEMI, CKD, and anemia.   Clinical Impression  Pt in bed upon arrival of PT, agreeable to evaluation at this time. Prior to admission the pt was ambulating with use of rollator in the community and furniture walking in the house, reports a few falls out of bed (pt unsure of time frame). The pt lives with multiple family members who can assist at d/c. She was able to complete bed mobility with supervision, and completed transfers and gait with CGA and use of RW. She was unable to ambulate without UE support at this time, discussed increased use of DME after return home and OPPT to progress endurance and dynamic stability. Pt will continue to benefit from skilled PT acutely to progress functional endurance and stability, but is safe from a mobility standpoint to return home with family when medically stable.      If plan is discharge home, recommend the following: A little help with walking and/or transfers;A little help with bathing/dressing/bathroom;Help with stairs or ramp for entrance;Assistance with cooking/housework   Can travel by private vehicle        Equipment Recommendations None recommended by PT  Recommendations for Other Services       Functional Status Assessment Patient has had a recent decline in their functional status and demonstrates the ability to make significant improvements in function in a reasonable and predictable amount of time.     Precautions / Restrictions Precautions Precautions: Fall Recall of Precautions/Restrictions: Intact Restrictions Weight Bearing Restrictions Per Provider  Order: No      Mobility  Bed Mobility Overal bed mobility: Needs Assistance Bed Mobility: Supine to Sit     Supine to sit: HOB elevated, Used rails, Supervision     General bed mobility comments: increased time, use of rails    Transfers Overall transfer level: Needs assistance Equipment used: Rolling walker (2 wheels), None Transfers: Sit to/from Stand, Bed to chair/wheelchair/BSC Sit to Stand: Contact guard assist   Step pivot transfers: Contact guard assist       General transfer comment: increased time to power up to standing from EOB, pt reaching for UE support. cues to maintain RW near her when turning to sit    Ambulation/Gait Ambulation/Gait assistance: Contact guard assist Gait Distance (Feet): 100 Feet Assistive device: Rolling walker (2 wheels) Gait Pattern/deviations: Step-through pattern, Decreased stride length, Trunk flexed Gait velocity: decreased Gait velocity interpretation: <1.31 ft/sec, indicative of household ambulator   General Gait Details: slowed gait, noted increased veering to L, instability with turn. pt reports due to use of RW instead of her normal rollator. no overt buckling, VSS     Balance Overall balance assessment: Needs assistance Sitting-balance support: No upper extremity supported Sitting balance-Leahy Scale: Good     Standing balance support: No upper extremity supported, Bilateral upper extremity supported, During functional activity Standing balance-Leahy Scale: Fair Standing balance comment: can static stand without UE support, BUE support for gait, min-modA when attempting to perform standing marches without UE support                             Pertinent  Vitals/Pain Pain Assessment Pain Assessment: No/denies pain    Home Living Family/patient expects to be discharged to:: Private residence Living Arrangements: Other relatives (son, daughter in law, and sister) Available Help at Discharge:  Family;Available 24 hours/day Type of Home: House Home Access: Stairs to enter Entrance Stairs-Rails: Right Entrance Stairs-Number of Steps: 1   Home Layout: One level Home Equipment: Rollator (4 wheels);Tub bench      Prior Function Prior Level of Function : Needs assist             Mobility Comments: pt reports use of rollator in community, not in home, furniture walker in the house. no recent falls. ADLs Comments: reports independence with ADLs, family assist with IADLs     Extremity/Trunk Assessment   Upper Extremity Assessment Upper Extremity Assessment: Overall WFL for tasks assessed    Lower Extremity Assessment Lower Extremity Assessment: Generalized weakness (more limited RLE than LLE due to pain, no changes in sensation, grossly 4-/5)    Cervical / Trunk Assessment Cervical / Trunk Assessment: Kyphotic  Communication   Communication Communication: Impaired Factors Affecting Communication: Hearing impaired    Cognition Arousal: Alert Behavior During Therapy: WFL for tasks assessed/performed   PT - Cognitive impairments: Problem solving, Safety/Judgement                       PT - Cognition Comments: pt needing cues for technique and mobility Following commands: Intact       Cueing Cueing Techniques: Verbal cues     General Comments General comments (skin integrity, edema, etc.): VSS on RA    Exercises     Assessment/Plan    PT Assessment Patient needs continued PT services  PT Problem List Decreased strength;Decreased activity tolerance;Decreased balance;Decreased mobility;Cardiopulmonary status limiting activity       PT Treatment Interventions DME instruction;Gait training;Stair training;Functional mobility training;Therapeutic activities;Therapeutic exercise;Balance training;Patient/family education    PT Goals (Current goals can be found in the Care Plan section)  Acute Rehab PT Goals Patient Stated Goal: return home, improve  balance PT Goal Formulation: With patient Time For Goal Achievement: 11/21/23 Potential to Achieve Goals: Good    Frequency Min 2X/week        AM-PAC PT 6 Clicks Mobility  Outcome Measure Help needed turning from your back to your side while in a flat bed without using bedrails?: None Help needed moving from lying on your back to sitting on the side of a flat bed without using bedrails?: None Help needed moving to and from a bed to a chair (including a wheelchair)?: A Little Help needed standing up from a chair using your arms (e.g., wheelchair or bedside chair)?: A Little Help needed to walk in hospital room?: A Little Help needed climbing 3-5 steps with a railing? : A Little 6 Click Score: 20    End of Session Equipment Utilized During Treatment: Gait belt Activity Tolerance: Patient tolerated treatment well Patient left: in chair;with call bell/phone within reach;with chair alarm set;with family/visitor present Nurse Communication: Mobility status PT Visit Diagnosis: Unsteadiness on feet (R26.81);Other abnormalities of gait and mobility (R26.89);Muscle weakness (generalized) (M62.81)    Time: 9057-8989 PT Time Calculation (min) (ACUTE ONLY): 28 min   Charges:   PT Evaluation $PT Eval Low Complexity: 1 Low PT Treatments $Therapeutic Exercise: 8-22 mins PT General Charges $$ ACUTE PT VISIT: 1 Visit         Izetta Call, PT, DPT   Acute Rehabilitation Department Office [phone] Secure Chat Communication  Preferred  Izetta JULIANNA Call 11/07/2023, 11:03 AM

## 2023-11-07 NOTE — Progress Notes (Signed)
 UR nurse working on Code 44 clearance.  Med detail completed.  Sharlet RN notified and updated.

## 2023-11-07 NOTE — Discharge Summary (Addendum)
 Discharge Summary   Patient ID: Patricia Jackson MRN: [id number]; DOB: 15-Sep-1940  Admit date: 11/06/2023 Discharge date: 11/07/2023  PCP:  Shona Norleen PEDLAR, MD   [facility] Providers Cardiologist:  Alvan Carrier, MD  Electrophysiologist:  Danelle Birmingham, MD    Discharge Diagnoses  Principal Problem:   Back pain   Diagnostic Studies/Procedures   _____________   History of Present Illness   Patricia Jackson is a 83 y.o. 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 Delford   Upon arrival to [facility], patient reports she woke up this morning and got coffee and laid back in bed. While laying down, she developed bilateral upper arm pain radiating from her back. She denied any associated chest pain or diaphoresis. She does note chronic shortness of breath from her COPD, which was not worse. She took nitroglycerin  x2 at home without improvement in her symptoms. She reports a prior history of similar symptoms throughout the years happening 3-4 times per year without a formal diagnosis of what was causing her symptoms; she has not had a recurrent episode in several months.    At [facility]:  CTA Chest 11/06/23: 1. No acute aortic dissection or mural hematoma. No aneurysm. Extensive calcified and noncalcified atherosclerosis. 2. Focal groundglass and tree-in-bud nodular attenuation of the right middle lobe and inferior lingular segment, which may represent infectious or  inflammatory bronchiolitis. 3. Upper lobe predominant paraseptal and centrilobular emphysema. Diffuse intralobular septal thickening, which may represent interstitial edema. Heart: Moderate coronary artery calcifications. CXR 11/06/23: No radiographic evidence of acute cardiopulmonary process.   Labs: WBC 3.7, Hgb 12.8 (RDW 19.5), plt 83, K 4.6, CL 110, CO2 21, Bun 29, Cr 1.22, Trop 13 -> 38  Hospital Course    Back pain  Elevated Troponin  - Patient presented after an episode of upper back pain that radiated down both arms. No chest pain or associated shortness of breath.  Symptoms did not resolve with nitroglycerin . Lasted about 1 hour then resolved on their own. She has been having episodes of this back pain for years  - Patient denies recent chest pain or DOE  - hsTn 29>26. Appears to have chronic troponin elevation, hsTn has been in the 20s-30s since 01/2023. EKG without ischemic changes  - Pain is atypical for a cardiac cause  - Ordered outpatient echocardiogram - scheduled at [facility] 8/20  - Patient seen and examined by Dr. Wonda, deemed stable for discharge - Patient has follow up with general cardiology 8/22    CAD - Patient previously had DES to the proximal circumflex in 2017 - Continue aspirin  81 mg daily - Continue Crestor  20 mg daily   Atrial flutter  - Previously underwent DCCV in 02/2023  - Maintaining NSR  - Continue amiodarone  200 mg daily  - eliquis  currently held for an ocular procedure this week    HTN  - BP well controlled  - Continue amlodipine  5 mg daily    HLD  - Lipid panel this admission showed LDL  73, HDL 70, triglycerides 51, total cholesterol 153  - Continue crestor  20 mg daily     Did the patient have an acute coronary syndrome (MI, NSTEMI, STEMI, etc) this admission?:  No                               Did the patient have a percutaneous coronary intervention (stent / angioplasty)?:  No.    _____________  Discharge Vitals Blood pressure (!)  117/57, pulse 64, temperature (!) 97.5 F (36.4 C), temperature source Oral, resp. rate 19, height 5' 7 (1.702 m), weight 62.3 kg, SpO2 98%.  Filed Weights   11/06/23 1955 11/07/23 0412  Weight: 62.3 kg 62.3 kg    Labs & Radiologic Studies  CBC Recent Labs    11/06/23 2224 11/07/23 0243  WBC 4.0 3.7*  NEUTROABS 2.7 2.2  HGB 12.2 12.2  HCT 38.7 39.1  MCV 93.0 94.0  PLT 76* 76*   Basic Metabolic Panel Recent Labs    91/96/74 2224 11/07/23 0243  NA 142 141  K 4.6 4.3  CL 113* 114*  CO2 19* 18*  GLUCOSE 96 87  BUN 27* 28*  CREATININE 1.87* 1.76*  CALCIUM  9.0 8.9  MG 2.3 2.2   Liver Function Tests No results for input(s): AST, ALT, ALKPHOS, BILITOT, PROT, ALBUMIN  in the last 72 hours. No results for input(s): LIPASE, AMYLASE in the last 72 hours. High Sensitivity Troponin:   Recent Labs  Lab 11/06/23 2224 11/06/23 2318  TROPONINIHS 29* 26*    No results for input(s): TRNPT in the last 720 hours.  BNP Invalid input(s): POCBNP No results for input(s): PROBNP in the last 72 hours.  No results for input(s): BNP in the last 72 hours.  D-Dimer No results for input(s): DDIMER in the last 72 hours. Hemoglobin A1C No results for input(s): HGBA1C in the last 72 hours. Fasting Lipid Panel Recent Labs    11/06/23 2224  CHOL 153  HDL 70  LDLCALC 73  TRIG 51  CHOLHDL 2.2   No results found for: LIPOA  Thyroid  Function Tests Recent Labs    11/06/23 2224  TSH 8.268*   _____________  No results found.  Disposition Pt is being discharged home today in good condition.  Follow-up Plans & Appointments  Follow-up Information     [facility] Follow up.   Specialty: Rehabilitation Why: They will call you to set up apt times, if you do not hear from them in 3 business days, please call them. thank you Contact information: [address] [place]  [address] [phone]                Discharge Instructions     Diet - low sodium heart healthy   Complete by: As directed    Increase activity slowly   Complete by: As directed        Discharge Medications Allergies as of 11/07/2023       Reactions   Povidone-iodine Other (See Comments)   Burning   Sulfa Antibiotics Other (See Comments)   Unknown    Sulfamethoxazole Other (See Comments)   Unknown         Medication List     TAKE these medications    acetaminophen  500 MG tablet Commonly known as: TYLENOL  Take 1,000 mg by mouth every 8 (eight) hours as needed for mild pain or moderate pain.   albuterol  (2.5  MG/3ML) 0.083% nebulizer solution Commonly known as: PROVENTIL  Take 2.5 mg by nebulization every 6 (six) hours as needed for shortness of breath or wheezing.   albuterol  108 (90 Base) MCG/ACT inhaler Commonly known as: VENTOLIN  HFA Inhale 2 puffs into the lungs every 4 (four) hours as needed.   amiodarone  200 MG tablet Commonly known as: PACERONE  Take 1 Tablet ( 200 mg ) Daily and none on Sunday   amLODipine  5 MG tablet Commonly known as: NORVASC  Take 5 mg by mouth daily.   apixaban  2.5 MG Tabs tablet Commonly known as: Eliquis  Take 1 tablet (2.5 mg total) by mouth 2 (two) times daily.   aspirin  EC 81 MG tablet Take 1 tablet (81 mg total) by mouth daily. Swallow whole. Start taking on: November 08, 2023   clobetasol 0.05 % external solution Commonly known as: TEMOVATE Apply 1 Application topically 2 (two) times daily as needed.   ferrous sulfate  324 MG Tbec Take 324 mg by mouth.   levothyroxine  75 MCG tablet Commonly known as: SYNTHROID  Take 75 mcg by mouth daily.   linaclotide  290 MCG Caps capsule Commonly known as: LINZESS  Take 1 capsule (290 mcg total) by mouth daily before breakfast.   nitroGLYCERIN  0.4 MG SL tablet Commonly known as: NITROSTAT  Place 1 tablet (0.4 mg total) under the tongue every 5 (five) minutes as needed for chest pain.   pantoprazole  40 MG  tablet Commonly known as: Protonix  Take 1 tablet (40 mg total) by mouth 2 (two) times daily.   rosuvastatin  20 MG tablet Commonly known as: CRESTOR  Take 20 mg by mouth daily.   sertraline  25 MG tablet Commonly known as: ZOLOFT  Take 25 mg by mouth daily.   traZODone  50 MG tablet Commonly known as: DESYREL  Take 50 mg by mouth at bedtime.   Trelegy Ellipta 200-62.5-25 MCG/ACT Aepb Generic drug: Fluticasone -Umeclidin-Vilant Inhale 1 puff into the lungs daily.         Outstanding Labs/Studies   Duration of Discharge Encounter: APP Time: 20 minutes   Signed, Rollo FABIENE Louder, PA-C 11/07/2023, 12:31 PM  Patient seen, examined. Available data reviewed. Agree with findings, assessment, and plan as outlined by Rollo Louder, PA-C.  Please see my progress note the same date.  The patient presented with atypical symptoms of back pain radiating to the arms and mildly elevated high-sensitivity troponin and a flat trend.  Her symptoms were not felt to be consistent with acute coronary syndrome.  On exam this morning, she is an alert and oriented elderly woman in no distress.  HEENT is normal, JVP is normal, lungs clear bilaterally, heart is regular rate and rhythm with no murmur or gallop, abdomen is soft and nontender, extremities have no edema, skin is warm and dry with no rash.  The patient is felt to be medically stable for discharge.  Her symptoms appear to be chronic and recurrent, but not consistent with coronary ischemia.  She will have an outpatient echocardiogram.  She will continue on amiodarone  and apixaban  for management of her atrial flutter.  Otherwise, as outlined above.  MD time spent conducting this discharge is equal to 25 minutes.  This includes my personal exam of the patient, counseling about the symptoms that brought her into the hospital, and discussion of posthospital management.  Patricia Jackson, M.D. 11/07/2023 4:06 PM

## 2023-11-07 NOTE — Care Management Obs Status (Signed)
 MEDICARE OBSERVATION STATUS NOTIFICATION   Patient Details  Name: Gursimran Litaker MRN: [id number] Date of Birth: 01-16-1941   Medicare Observation Status Notification Given:  Yes    Waddell Barnie Rama, RN 11/07/2023, 2:26 PM

## 2023-11-07 NOTE — Progress Notes (Signed)
 Mobility Specialist Progress Note:    11/07/23 1025  Mobility  Activity Ambulated with assistance (in room)  Level of Assistance Contact guard assist, steadying assist  Assistive Device Front wheel walker  Distance Ambulated (ft) 25 ft  Activity Response Tolerated well  Mobility Referral Yes  Mobility visit 1 Mobility  Mobility Specialist Start Time (ACUTE ONLY) 1025  Mobility Specialist Stop Time (ACUTE ONLY) 1032  Mobility Specialist Time Calculation (min) (ACUTE ONLY) 7 min   Received in pt. Pt pleasant and agreeable to session even after seeing pt. No c/o any symptoms. Was waiting for MD to come see her and her daughter so asked to keep mobility to room level. Pt ambulated to door and back to recliner. Left pt in recliner w/ daughter and all needs met.   Venetia Keel Mobility Specialist Please Neurosurgeon or Rehab Office at [phone]

## 2023-11-07 NOTE — Discharge Instructions (Signed)
 Stop aspirin  once eliquis  is resumed

## 2023-11-07 NOTE — Progress Notes (Signed)
 Reviewed AVS, patient expressed understanding of medications, MD follow up reviewed.   Patient states all belongings brought to the hospital at time of admission are accounted for and packed to take home.  Pt transported to ER dept. Entrance where family member was waiting in vehicle to transport home.

## 2023-11-07 NOTE — TOC Transition Note (Signed)
 Transition of Care [facility]) - Discharge Note   Patient Details  Name: Kingslee Dowse MRN: [id number] Date of Birth: 1940/12/22  [facility] CM/SW Contact:  Waddell Barnie Rama, RN Phone Number: 11/07/2023, 12:50 PM   Clinical Narrative:    For dc today, she is set up with OP Rehab in [place], on AVS.  She has transport home.   Final next level of care: Home/Self Care Barriers to Discharge: Continued Medical Work up   Patient Goals and CMS Choice Patient states their goals for this hospitalization and ongoing recovery are:: get better   Choice offered to / list presented to : NA      Discharge Placement                       Discharge Plan and Services Additional resources added to the After Visit Summary for   In-house Referral: NA Discharge Planning Services: CM Consult Post Acute Care Choice: NA          DME Arranged: N/A DME Agency: NA       HH Arranged: NA          Social Drivers of Health (SDOH) Interventions SDOH Screenings   Food Insecurity: No Food Insecurity (09/05/2023)  Housing: Unknown (10/13/2023)   Received from [facility]  Transportation Needs: No Transportation Needs (09/05/2023)  Utilities: Not At Risk (09/05/2023)  Depression (PHQ2-9): Low Risk  (09/26/2023)  Financial Resource Strain: Low Risk  (11/26/2022)  Social Connections: Moderately Integrated (09/05/2023)  Stress: No Stress Concern Present (11/26/2022)  Tobacco Use: Medium Risk (11/06/2023)   Received from [facility]  Health Literacy: Adequate Health Literacy (11/26/2022)     Readmission Risk Interventions    11/07/2023   11:39 AM 06/16/2023   11:02 AM 02/04/2023   10:23 AM  Readmission Risk Prevention Plan  Transportation Screening Complete Complete Complete  PCP or Specialist Appt within 3-5 Days Complete    HRI or Home Care Consult Complete    Palliative Care Screening Not Applicable    Medication Review (RN Care Manager) Complete Complete  Complete  PCP or Specialist appointment within 3-5 days of discharge  Not Complete   HRI or Home Care Consult  Complete Complete  SW Recovery Care/Counseling Consult  Complete Complete  Palliative Care Screening  Not Applicable Not Applicable  Skilled Nursing Facility  Not Applicable Not Applicable

## 2023-11-07 NOTE — Care Management CC44 (Signed)
 Condition Code 44 Documentation Completed  Patient Details  Name: Patricia Jackson MRN: [id number] Date of Birth: Mar 09, 1941   Condition Code 44 given:  Yes Patient signature on Condition Code 44 notice:  Yes Documentation of 2 MD's agreement:  Yes Code 44 added to claim:  Yes    Waddell Barnie Rama, RN 11/07/2023, 2:26 PM

## 2023-11-07 NOTE — TOC Initial Note (Signed)
 Transition of Care [facility]) - Initial/Assessment Note    Patient Details  Name: Patricia Jackson MRN: [id number] Date of Birth: 01-16-1941  Transition of Care [facility]) CM/SW Contact:    Waddell Barnie Rama, RN Phone Number: 11/07/2023, 11:44 AM  Clinical Narrative:                 From home with her son and DIL,  has PCP and insurance on file, states has no HH services in place at this time , has w/chair and walker at home.  States family member will transport them home at [employer] and family is support system, states gets medications from [facility].  Pta self ambulatory    with walker.   Patient gives this NCM permission to speak with her DIL, Ezzie and patient's son.  Per PT eval rec outpatient PT, patient would like to do this at the OP rehab in [place],  UTAH made referral thru epic for her, added to AVS.  Patient will also speak with her PCP regarding PCS services for her.  Expected Discharge Plan: Home/Self Care Barriers to Discharge: Continued Medical Work up   Patient Goals and CMS Choice Patient states their goals for this hospitalization and ongoing recovery are:: get better   Choice offered to / list presented to : NA      Expected Discharge Plan and Services In-house Referral: NA Discharge Planning Services: CM Consult Post Acute Care Choice: NA Living arrangements for the past 2 months: Single Family Home                 DME Arranged: N/A DME Agency: NA       HH Arranged: NA          Prior Living Arrangements/Services Living arrangements for the past 2 months: Single Family Home Lives with:: Adult Children Patient language and need for interpreter reviewed:: Yes Do you feel safe going back to the place where you live?: Yes      Need for Family Participation in Patient Care: Yes (Comment) Care giver support system in place?: Yes (comment) Current home services: DME (walker, w/chair) Criminal Activity/Legal Involvement Pertinent to Current  Situation/Hospitalization: No - Comment as needed  Activities of Daily Living      Permission Sought/Granted Permission sought to share information with : Case Manager Permission granted to share information with : Yes, Verbal Permission Granted  Share Information with NAME: DIL and son ETTER Ezzie and her son)  Permission granted to share info w AGENCY: OP rehab        Emotional Assessment Appearance:: Appears stated age Attitude/Demeanor/Rapport: Engaged Affect (typically observed): Appropriate Orientation: : Oriented to Self, Oriented to Place, Oriented to  Time, Oriented to Situation Alcohol / Substance Use: Not Applicable Psych Involvement: No (comment)  Admission diagnosis:  NSTEMI (non-ST elevated myocardial infarction) (HCC) [I21.4] Patient Active Problem List   Diagnosis Date Noted   Chronic anticoagulation 09/12/2023   Melena 09/06/2023   Symptomatic anemia 09/05/2023   Anemia of chronic renal failure 08/22/2023   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   Severe anemia 06/16/2023   Acute blood loss anemia 06/15/2023   Elevated brain natriuretic peptide (BNP) level 02/03/2023   Paroxysmal atrial fibrillation with RVR (HCC) 02/02/2023   Malnutrition of moderate degree 01/24/2023   Atrial fibrillation with RVR (HCC) 01/20/2023   On anticoagulant therapy 11/21/2022   ABLA (acute blood loss anemia) 11/21/2022   Raynaud's syndrome 11/20/2022   Osteoarthritis 11/20/2022   Generalized anxiety disorder 11/18/2022  Positive antinuclear antibody 11/02/2022   Insomnia 11/02/2022   Gastroesophageal reflux disease 11/01/2022   Abrasion of lower back 10/11/2022   Atrial fibrillation (HCC) 09/23/2022   Vitamin D  deficiency 09/23/2022   Syncope 09/13/2022   CAP (community acquired pneumonia) 09/12/2022   sepsis from pneumonia 07/26/2022   intermittent confusion 07/26/2022   Hyponatremia 07/26/2022   Preoperative evaluation of a medical condition to rule out surgical  contraindications (TAR required) 05/14/2022   Fracture of distal end of radius 05/02/2022   Left wrist pain 04/28/2022   Osteoporosis 01/19/2022   DOE (dyspnea on exertion)    Acidosis, unspecified 06/20/2021   Elevated troponin 06/19/2021   IDA (iron  deficiency anemia) 06/12/2021   Acquired hypothyroidism 05/15/2021   CKD stage 3b, GFR 30-44 ml/min (HCC) 05/15/2021   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   Essential hypertension 12/10/2020   Retinal hemorrhage, right eye 11/04/2020   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   Traction detachment of left retina 08/03/2019   Left retinal detachment 08/01/2019   Age-related macular degeneration 08/01/2019   Retinal hemorrhage of left eye 08/01/2019   Advanced nonexudative age-related macular degeneration of left eye with subfoveal involvement 08/01/2019   Retinal detachment 07/26/2019   Choroidal detachment of right eye 07/26/2019   Exudative retinopathy of left eye 07/26/2019   Thrombocytopenic disorder (HCC) 07/04/2019   Orthostatic syncope 08/27/2017   Atypical chest pain 08/27/2017   Atrial flutter (HCC) 10/28/2016   Chronic kidney disease, stage 4 (severe) (HCC) 10/28/2016   Chronic obstructive pulmonary disease (HCC) 10/28/2016   Depression with anxiety 10/28/2016   Acute kidney injury superimposed on stage 4 chronic kidney disease (HCC) 09/08/2016   Hyperglycemia 09/08/2016   Esophageal dysphagia    Anemia 04/07/2016   CAD in native artery 03/16/2016   S/P angioplasty with stent 03/15/16 DES Resolute, pLCX 03/16/2016   NSTEMI (non-ST elevated myocardial infarction) (HCC) 03/12/2016   Dyspnea on exertion    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HCC) 10/31/2015   Hyperlipidemia 10/31/2015   Atrial flutter with rapid ventricular response (HCC) 10/30/2015   Back pain 11/26/2013   Arm pain 11/26/2013   Chest pain 11/26/2013   PCP:  Shona Norleen PEDLAR, MD Pharmacy:   [facility] [place] [place] - [address] [place] [address] Phone: [phone] Fax: [phone]  [facility] - [address], [place] - [address] [place] [address] Phone: [phone] Fax: [phone]  [address] [facility] - [place], [place] - [place] [address] [place] [address] Phone: [phone] Fax: [phone]     Social Drivers of Health (SDOH) Social History: SDOH Screenings   Food Insecurity: No Food Insecurity (09/05/2023)  Housing: Unknown (10/13/2023)   Received from [facility]  Transportation Needs: No Transportation Needs (09/05/2023)  Utilities: Not At Risk (09/05/2023)  Depression (PHQ2-9): Low Risk  (09/26/2023)  Financial Resource Strain: Low Risk  (11/26/2022)  Social Connections: Moderately Integrated (09/05/2023)  Stress: No Stress Concern Present (11/26/2022)  Tobacco Use: Medium Risk (11/06/2023)   Received from [facility]  Health Literacy: Adequate Health Literacy (11/26/2022)   SDOH Interventions:     Readmission Risk Interventions    11/07/2023   11:39 AM 06/16/2023   11:02 AM 02/04/2023   10:23 AM  Readmission Risk Prevention Plan  Transportation Screening Complete Complete Complete  PCP or Specialist Appt within 3-5 Days Complete    HRI or Home Care Consult Complete    Palliative Care  Screening Not Applicable    Medication Review (RN Care Manager) Complete Complete Complete  PCP or Specialist appointment within 3-5 days of discharge  Not Complete   HRI or Home Care Consult  Complete Complete  SW Recovery Care/Counseling Consult  Complete Complete  Palliative Care Screening  Not Applicable Not Applicable  Skilled Nursing Facility  Not Applicable Not Applicable

## 2023-11-07 NOTE — Progress Notes (Addendum)
 Progress Note  Patient Name: Patricia Jackson Date of Encounter: 11/07/2023 [facility] Cardiologist: Alvan Carrier, MD   Interval Summary   Patient reports feeling well this AM. Yesterday morning, she developed intense upper back pain that radiated down both arms. She has been having episodes of this pain for years. Lasted about 1 hour. Did not resolve with nitroglycerin . Her breathing has been stable. No chest pain   Vital Signs Vitals:   11/06/23 2341 11/07/23 0412 11/07/23 0716 11/07/23 0900  BP: 130/65 130/60 117/68 121/60  Pulse: (!) 55  (!) 52   Resp: 15 16 15    Temp: (!) 97.5 F (36.4 C) (!) 97.2 F (36.2 C) (!) 97.3 F (36.3 C)   TempSrc: Oral Oral Oral   SpO2: 98% 98% 98%   Weight:  62.3 kg    Height:        Intake/Output Summary (Last 24 hours) at 11/07/2023 1045 Last data filed at 11/07/2023 0135 Gross per 24 hour  Intake 240 ml  Output --  Net 240 ml      11/07/2023    4:12 AM 11/06/2023    7:55 PM 11/01/2023    9:28 AM  Last 3 Weights  Weight (lbs) 137 lb 5.6 oz 137 lb 5.6 oz 142 lb 3.2 oz  Weight (kg) 62.3 kg 62.3 kg 64.501 kg      Telemetry/ECG  NSR - Personally Reviewed  Physical Exam  GEN: No acute distress.  Sitting upright in the armchair  Neck: No JVD Cardiac:  RRR, no murmurs, rubs, or gallops.  Respiratory: Clear to auscultation bilaterally. GI: Soft, nontender, non-distended  MS: No edema in BLE   Assessment & Plan   Back pain  Elevated Troponin  - Patient presented with acute onset bilateral arm pain.  No chest pain or associated shortness of breath.  Also had back pain. - hsTn 29>26. Appears to have chronic troponin elevation, hsTn has been in the 20s-30s since 01/2023  - Denies back pain, arm pain, chest pain since coming to the ED  -Echocardiogram pending this admission  CAD - Patient previously had DES to the proximal circumflex in 2017 - Continue aspirin  81 mg daily - Continue Crestor  20 mg daily  Atrial flutter  -  Previously underwent DCCV in 02/2023  - Maintaining NSR per tele  - Continue amiodarone  200 mg daily  - eliquis  currently held for an ocular procedure this week   HTN  - BP well controlled  - Continue amlodipine  5 mg daily   HLD  - Lipid panel this admission showed LDL 73, HDL 70, triglycerides 51, total cholesterol 153  - Continue crestor  20 mg daily   For questions or updates, please contact  HeartCare Please consult [email or web address] for contact info under       Signed, Rollo FABIENE Louder, PA-C   Patient seen, examined. Available data reviewed. Agree with findings, assessment, and plan as outlined by Rollo Louder, PA-C.  The patient is independently interviewed and examined.  On my exam:  Vitals:   11/07/23 0900 11/07/23 1107  BP: 121/60 (!) 117/57  Pulse:  64  Resp:  19  Temp:  (!) 97.5 F (36.4 C)  SpO2:  98%   Pt is alert and oriented, elderly woman, in NAD HEENT: normal Neck: JVP - normal Lungs: CTA bilaterally CV: RRR without murmur or gallop Abd: soft, NT, Positive BS, no hepatomegaly Ext: no C/C/E, distal pulses intact and equal Skin: warm/dry no rash  The patient has  atypical symptoms of back pain radiating into the arms.  She has had mildly elevated high-sensitivity troponins in the past.  This admission her troponin elevation is very low level with a flat trend.  She does not have symptoms or findings consistent with acute coronary syndrome.  Will continue aspirin  and rosuvastatin .  Aspirin  can be discontinued when she goes back onto apixaban  after her ocular procedure later this week.  Continue current medical therapy as outlined above.  I think the patient can have an outpatient echocardiogram as she has eager for discharge and there are no acute findings on her exam to suggest heart failure, cardiac dysfunction, or valvular problems.  Will arrange outpatient follow-up with an echocardiogram and follow-up cardiology office visit.  Otherwise, as  outlined above.  Ozell Fell, M.D. 11/07/2023 12:15 PM

## 2023-11-08 LAB — HEMOGLOBIN A1C
Hgb A1c MFr Bld: 5.3 % (ref 4.8–5.6)
Mean Plasma Glucose: 105 mg/dL

## 2023-11-09 DIAGNOSIS — I2510 Atherosclerotic heart disease of native coronary artery without angina pectoris: Secondary | ICD-10-CM | POA: Diagnosis not present

## 2023-11-09 DIAGNOSIS — Z87891 Personal history of nicotine dependence: Secondary | ICD-10-CM | POA: Diagnosis not present

## 2023-11-09 DIAGNOSIS — K219 Gastro-esophageal reflux disease without esophagitis: Secondary | ICD-10-CM | POA: Diagnosis not present

## 2023-11-09 DIAGNOSIS — I129 Hypertensive chronic kidney disease with stage 1 through stage 4 chronic kidney disease, or unspecified chronic kidney disease: Secondary | ICD-10-CM | POA: Diagnosis not present

## 2023-11-09 DIAGNOSIS — J449 Chronic obstructive pulmonary disease, unspecified: Secondary | ICD-10-CM | POA: Diagnosis not present

## 2023-11-09 DIAGNOSIS — Z955 Presence of coronary angioplasty implant and graft: Secondary | ICD-10-CM | POA: Diagnosis not present

## 2023-11-09 DIAGNOSIS — N184 Chronic kidney disease, stage 4 (severe): Secondary | ICD-10-CM | POA: Diagnosis not present

## 2023-11-09 DIAGNOSIS — D631 Anemia in chronic kidney disease: Secondary | ICD-10-CM | POA: Diagnosis not present

## 2023-11-09 DIAGNOSIS — I4892 Unspecified atrial flutter: Secondary | ICD-10-CM | POA: Diagnosis not present

## 2023-11-09 DIAGNOSIS — H33051 Total retinal detachment, right eye: Secondary | ICD-10-CM | POA: Diagnosis not present

## 2023-11-09 DIAGNOSIS — E039 Hypothyroidism, unspecified: Secondary | ICD-10-CM | POA: Diagnosis not present

## 2023-11-09 DIAGNOSIS — I480 Paroxysmal atrial fibrillation: Secondary | ICD-10-CM | POA: Diagnosis not present

## 2023-11-09 DIAGNOSIS — Z7951 Long term (current) use of inhaled steroids: Secondary | ICD-10-CM | POA: Diagnosis not present

## 2023-11-09 DIAGNOSIS — Z79899 Other long term (current) drug therapy: Secondary | ICD-10-CM | POA: Diagnosis not present

## 2023-11-09 DIAGNOSIS — H35341 Macular cyst, hole, or pseudohole, right eye: Secondary | ICD-10-CM | POA: Diagnosis not present

## 2023-11-09 DIAGNOSIS — I10 Essential (primary) hypertension: Secondary | ICD-10-CM | POA: Diagnosis not present

## 2023-11-09 DIAGNOSIS — I252 Old myocardial infarction: Secondary | ICD-10-CM | POA: Diagnosis not present

## 2023-11-09 DIAGNOSIS — J8410 Pulmonary fibrosis, unspecified: Secondary | ICD-10-CM | POA: Diagnosis not present

## 2023-11-09 DIAGNOSIS — Z882 Allergy status to sulfonamides status: Secondary | ICD-10-CM | POA: Diagnosis not present

## 2023-11-09 DIAGNOSIS — Z888 Allergy status to other drugs, medicaments and biological substances status: Secondary | ICD-10-CM | POA: Diagnosis not present

## 2023-11-09 DIAGNOSIS — H3561 Retinal hemorrhage, right eye: Secondary | ICD-10-CM | POA: Diagnosis not present

## 2023-11-11 ENCOUNTER — Other Ambulatory Visit: Payer: Self-pay

## 2023-11-11 DIAGNOSIS — E559 Vitamin D deficiency, unspecified: Secondary | ICD-10-CM

## 2023-11-11 DIAGNOSIS — D649 Anemia, unspecified: Secondary | ICD-10-CM

## 2023-11-11 NOTE — Progress Notes (Signed)
 CBC, CMP, copper , iron  panel, ferritin, folate, B12 ordered per Dr. Armanda orders.

## 2023-11-14 ENCOUNTER — Inpatient Hospital Stay: Attending: Oncology | Admitting: *Deleted

## 2023-11-14 DIAGNOSIS — R531 Weakness: Secondary | ICD-10-CM | POA: Diagnosis not present

## 2023-11-14 DIAGNOSIS — I2510 Atherosclerotic heart disease of native coronary artery without angina pectoris: Secondary | ICD-10-CM | POA: Diagnosis not present

## 2023-11-14 DIAGNOSIS — K5900 Constipation, unspecified: Secondary | ICD-10-CM | POA: Insufficient documentation

## 2023-11-14 DIAGNOSIS — M546 Pain in thoracic spine: Secondary | ICD-10-CM | POA: Insufficient documentation

## 2023-11-14 DIAGNOSIS — N1831 Chronic kidney disease, stage 3a: Secondary | ICD-10-CM | POA: Diagnosis not present

## 2023-11-14 DIAGNOSIS — Z853 Personal history of malignant neoplasm of breast: Secondary | ICD-10-CM | POA: Insufficient documentation

## 2023-11-14 DIAGNOSIS — I129 Hypertensive chronic kidney disease with stage 1 through stage 4 chronic kidney disease, or unspecified chronic kidney disease: Secondary | ICD-10-CM | POA: Diagnosis not present

## 2023-11-14 DIAGNOSIS — Z7901 Long term (current) use of anticoagulants: Secondary | ICD-10-CM | POA: Insufficient documentation

## 2023-11-14 DIAGNOSIS — Z79899 Other long term (current) drug therapy: Secondary | ICD-10-CM | POA: Diagnosis not present

## 2023-11-14 DIAGNOSIS — R519 Headache, unspecified: Secondary | ICD-10-CM | POA: Insufficient documentation

## 2023-11-14 DIAGNOSIS — G479 Sleep disorder, unspecified: Secondary | ICD-10-CM | POA: Diagnosis not present

## 2023-11-14 DIAGNOSIS — Z7982 Long term (current) use of aspirin: Secondary | ICD-10-CM | POA: Insufficient documentation

## 2023-11-14 DIAGNOSIS — R112 Nausea with vomiting, unspecified: Secondary | ICD-10-CM | POA: Diagnosis not present

## 2023-11-14 DIAGNOSIS — Z882 Allergy status to sulfonamides status: Secondary | ICD-10-CM | POA: Insufficient documentation

## 2023-11-14 DIAGNOSIS — D696 Thrombocytopenia, unspecified: Secondary | ICD-10-CM | POA: Diagnosis not present

## 2023-11-14 DIAGNOSIS — J449 Chronic obstructive pulmonary disease, unspecified: Secondary | ICD-10-CM | POA: Diagnosis not present

## 2023-11-14 DIAGNOSIS — Z9049 Acquired absence of other specified parts of digestive tract: Secondary | ICD-10-CM | POA: Insufficient documentation

## 2023-11-14 DIAGNOSIS — Z87891 Personal history of nicotine dependence: Secondary | ICD-10-CM | POA: Diagnosis not present

## 2023-11-14 DIAGNOSIS — Z825 Family history of asthma and other chronic lower respiratory diseases: Secondary | ICD-10-CM | POA: Insufficient documentation

## 2023-11-14 DIAGNOSIS — E559 Vitamin D deficiency, unspecified: Secondary | ICD-10-CM | POA: Insufficient documentation

## 2023-11-14 DIAGNOSIS — G4700 Insomnia, unspecified: Secondary | ICD-10-CM | POA: Diagnosis not present

## 2023-11-14 DIAGNOSIS — Z888 Allergy status to other drugs, medicaments and biological substances status: Secondary | ICD-10-CM | POA: Diagnosis not present

## 2023-11-14 DIAGNOSIS — Z8249 Family history of ischemic heart disease and other diseases of the circulatory system: Secondary | ICD-10-CM | POA: Insufficient documentation

## 2023-11-14 DIAGNOSIS — R7989 Other specified abnormal findings of blood chemistry: Secondary | ICD-10-CM | POA: Insufficient documentation

## 2023-11-14 DIAGNOSIS — Z809 Family history of malignant neoplasm, unspecified: Secondary | ICD-10-CM | POA: Insufficient documentation

## 2023-11-14 DIAGNOSIS — I4891 Unspecified atrial fibrillation: Secondary | ICD-10-CM | POA: Diagnosis not present

## 2023-11-14 DIAGNOSIS — D649 Anemia, unspecified: Secondary | ICD-10-CM

## 2023-11-14 DIAGNOSIS — R0602 Shortness of breath: Secondary | ICD-10-CM | POA: Diagnosis not present

## 2023-11-14 DIAGNOSIS — I252 Old myocardial infarction: Secondary | ICD-10-CM | POA: Insufficient documentation

## 2023-11-14 LAB — COMPREHENSIVE METABOLIC PANEL WITH GFR
ALT: 20 U/L (ref 0–44)
AST: 38 U/L (ref 15–41)
Albumin: 3.9 g/dL (ref 3.5–5.0)
Alkaline Phosphatase: 47 U/L (ref 38–126)
Anion gap: 7 (ref 5–15)
BUN: 37 mg/dL — ABNORMAL HIGH (ref 8–23)
CO2: 19 mmol/L — ABNORMAL LOW (ref 22–32)
Calcium: 9 mg/dL (ref 8.9–10.3)
Chloride: 108 mmol/L (ref 98–111)
Creatinine, Ser: 1.74 mg/dL — ABNORMAL HIGH (ref 0.44–1.00)
GFR, Estimated: 29 mL/min — ABNORMAL LOW (ref >60)
Glucose, Bld: 117 mg/dL — ABNORMAL HIGH (ref 70–99)
Potassium: 4.4 mmol/L (ref 3.5–5.1)
Sodium: 134 mmol/L — ABNORMAL LOW (ref 135–145)
Total Bilirubin: 0.4 mg/dL (ref 0.0–1.2)
Total Protein: 7 g/dL (ref 6.5–8.1)

## 2023-11-14 LAB — CBC
HCT: 39.5 % (ref 36.0–46.0)
Hemoglobin: 12.3 g/dL (ref 12.0–15.0)
MCH: 29.9 pg (ref 26.0–34.0)
MCHC: 31.1 g/dL (ref 30.0–36.0)
MCV: 95.9 fL (ref 80.0–100.0)
Platelets: 92 K/uL — ABNORMAL LOW (ref 150–400)
RBC: 4.12 MIL/uL (ref 3.87–5.11)
RDW: 18.7 % — ABNORMAL HIGH (ref 11.5–15.5)
WBC: 5 K/uL (ref 4.0–10.5)
nRBC: 0 % (ref 0.0–0.2)

## 2023-11-14 LAB — IRON AND TIBC
Iron: 51 ug/dL (ref 28–170)
Saturation Ratios: 16 % (ref 10.4–31.8)
TIBC: 317 ug/dL (ref 250–450)
UIBC: 266 ug/dL

## 2023-11-14 LAB — VITAMIN B12: Vitamin B-12: 563 pg/mL (ref 180–914)

## 2023-11-14 LAB — FOLATE: Folate: 16 ng/mL (ref >5.9)

## 2023-11-14 LAB — FERRITIN: Ferritin: 229 ng/mL (ref 11–307)

## 2023-11-16 ENCOUNTER — Inpatient Hospital Stay (HOSPITAL_BASED_OUTPATIENT_CLINIC_OR_DEPARTMENT_OTHER): Admitting: Oncology

## 2023-11-16 VITALS — BP 137/66 | HR 64 | Temp 97.5°F | Resp 16 | Wt 135.4 lb

## 2023-11-16 DIAGNOSIS — R7989 Other specified abnormal findings of blood chemistry: Secondary | ICD-10-CM | POA: Diagnosis not present

## 2023-11-16 DIAGNOSIS — Z7982 Long term (current) use of aspirin: Secondary | ICD-10-CM | POA: Diagnosis not present

## 2023-11-16 DIAGNOSIS — D696 Thrombocytopenia, unspecified: Secondary | ICD-10-CM

## 2023-11-16 DIAGNOSIS — D509 Iron deficiency anemia, unspecified: Secondary | ICD-10-CM | POA: Diagnosis not present

## 2023-11-16 DIAGNOSIS — Z853 Personal history of malignant neoplasm of breast: Secondary | ICD-10-CM | POA: Diagnosis not present

## 2023-11-16 DIAGNOSIS — N1831 Chronic kidney disease, stage 3a: Secondary | ICD-10-CM | POA: Diagnosis not present

## 2023-11-16 DIAGNOSIS — J449 Chronic obstructive pulmonary disease, unspecified: Secondary | ICD-10-CM | POA: Diagnosis not present

## 2023-11-16 DIAGNOSIS — G479 Sleep disorder, unspecified: Secondary | ICD-10-CM | POA: Diagnosis not present

## 2023-11-16 DIAGNOSIS — I2510 Atherosclerotic heart disease of native coronary artery without angina pectoris: Secondary | ICD-10-CM | POA: Diagnosis not present

## 2023-11-16 DIAGNOSIS — Z720 Tobacco use: Secondary | ICD-10-CM | POA: Diagnosis not present

## 2023-11-16 DIAGNOSIS — I252 Old myocardial infarction: Secondary | ICD-10-CM | POA: Diagnosis not present

## 2023-11-16 DIAGNOSIS — I4891 Unspecified atrial fibrillation: Secondary | ICD-10-CM | POA: Diagnosis not present

## 2023-11-16 DIAGNOSIS — Z882 Allergy status to sulfonamides status: Secondary | ICD-10-CM | POA: Diagnosis not present

## 2023-11-16 DIAGNOSIS — G4700 Insomnia, unspecified: Secondary | ICD-10-CM | POA: Diagnosis not present

## 2023-11-16 DIAGNOSIS — M546 Pain in thoracic spine: Secondary | ICD-10-CM | POA: Diagnosis not present

## 2023-11-16 DIAGNOSIS — I129 Hypertensive chronic kidney disease with stage 1 through stage 4 chronic kidney disease, or unspecified chronic kidney disease: Secondary | ICD-10-CM | POA: Diagnosis not present

## 2023-11-16 DIAGNOSIS — K5900 Constipation, unspecified: Secondary | ICD-10-CM | POA: Diagnosis not present

## 2023-11-16 DIAGNOSIS — E559 Vitamin D deficiency, unspecified: Secondary | ICD-10-CM | POA: Diagnosis not present

## 2023-11-16 DIAGNOSIS — Z888 Allergy status to other drugs, medicaments and biological substances status: Secondary | ICD-10-CM | POA: Diagnosis not present

## 2023-11-16 DIAGNOSIS — R112 Nausea with vomiting, unspecified: Secondary | ICD-10-CM | POA: Diagnosis not present

## 2023-11-16 DIAGNOSIS — R531 Weakness: Secondary | ICD-10-CM | POA: Diagnosis not present

## 2023-11-16 DIAGNOSIS — R519 Headache, unspecified: Secondary | ICD-10-CM | POA: Diagnosis not present

## 2023-11-16 DIAGNOSIS — Z79899 Other long term (current) drug therapy: Secondary | ICD-10-CM | POA: Diagnosis not present

## 2023-11-16 DIAGNOSIS — Z7901 Long term (current) use of anticoagulants: Secondary | ICD-10-CM | POA: Diagnosis not present

## 2023-11-16 DIAGNOSIS — Z87891 Personal history of nicotine dependence: Secondary | ICD-10-CM | POA: Diagnosis not present

## 2023-11-16 DIAGNOSIS — R0602 Shortness of breath: Secondary | ICD-10-CM | POA: Diagnosis not present

## 2023-11-16 LAB — COPPER, SERUM: Copper: 90 ug/dL (ref 80–158)

## 2023-11-16 NOTE — Assessment & Plan Note (Addendum)
-   Smokes 2 PPD x60 years - CT chest lung cancer screening in November 2022 was lung RADS 2, benign -She is supposed to get low-dose CT scan by her PCP.

## 2023-11-16 NOTE — Assessment & Plan Note (Addendum)
-   Patient was started on vitamin D  1000 units daily at her last visit -No recent vitamin D  level drawn. -We will draw this at her next visit.

## 2023-11-16 NOTE — Assessment & Plan Note (Addendum)
-   Seen at the request of Dr. Shona for platelet count of 62.  She has some easy bruising. - She is on Eliquis  for atrial fibrillation. - Hematologic work-up showed SPEP negative.  B12, methylmalonic acid, copper  and folic acid  levels were normal when checked in March 2021.  LDH was normal.  White count and platelet count was normal. - ANA was positive with a dilution of 1 and 1280.  (She has positive Raynaud's phenomena.  Evaluated by rheumatology.)  Rheumatoid factor is negative.  Hepatitis panel was negative. - She admits to easy bruising but denies any signs or symptoms of major blood loss    - We reviewed her most recent CBC which showed improvement of her platelet counts to 92,000. 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

## 2023-11-16 NOTE — Progress Notes (Signed)
 [facility] [address], [place] [address]   CLINIC:  Medical Oncology/Hematology  PCP:  Shona Norleen PEDLAR, MD [address] [place] [address] [phone]   REASON FOR VISIT:  Follow-up for anemia and thrombocytopenia  PRIOR THERAPY: None  CURRENT THERAPY: Under work-up  INTERVAL HISTORY:  Patricia Jackson 83 y.o. female returns for routine follow-up of her chronic thrombocytopenia and anemia.    Patient was recently discharged from the hospital on 11/07/2023 for back pain and she was found to have an elevated troponin NSTEMI.  She apparently had upper back pain that radiated down both arms with no associated chest pain or shortness of breath.  Symptoms did not resolve with nitroglycerin .  They eventually resolved on their own.  Elevated troponin and EKG without ischemic changes.  She scheduled to have a outpatient echocardiogram on 11/23/2023 and follow-up with cardiology on 11/25/2023.  Patient was evaluated for a fall on 09/21/2023 after she rolled out of bed.  She is on blood thinners.  She suffered a left temporal hematoma.  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 Mat at [facility].  She has follow-up tomorrow.  She continues to have pretty severe pain in her right eye.  She does not have any vision out of that eye.  She is here to review her most recent lab work.  Patient most recently had 3 doses of IV Venofer  200 mg 09/20/2023, 09/22/2023 and 09/26/2023.  Reports overall feeling about the same since her last visit here.  She is no longer taking copper  supplements as she ran out.  She is taking iron  supplements daily and tolerating them well.  Her stools are dark.  Reports generalized weakness and fatigue but thinks it may be related to her recent hospital stay.  She has frequent headaches, nausea, vomiting, constipation, cough and shortness of breath.  She has some insomnia.  Appetite is 75% energy levels are  50%.  She continues to take Eliquis  for atrial fibrillation.  She reports a single episode of bright red blood in the toilet after straining for bowel movement.  She has not had any melena or epistaxis.   REVIEW OF SYSTEMS:    Review of Systems  Constitutional:  Positive for fatigue.  Eyes:  Positive for eye problems (Right eye).  Respiratory:  Positive for cough and shortness of breath.   Gastrointestinal:  Positive for constipation, nausea and vomiting.  Neurological:  Positive for headaches.  Psychiatric/Behavioral:  Positive for sleep disturbance.       PAST MEDICAL/SURGICAL HISTORY:  Past Medical History:  Diagnosis Date   A-fib [facility])    Atrial flutter (HCC)    On Eliquis  in 8/17 but discontinued after stent placement   Breast cancer (HCC)    remote   CAD in native artery 03/16/2016   COPD (chronic obstructive pulmonary disease) (HCC)    Dysrhythmia    Hypertension    Iron  deficiency anemia 06/12/2021   NSTEMI (non-ST elevated myocardial infarction) (HCC)    2017   S/P angioplasty with stent 03/15/16 DES Resolute, pLCX 03/16/2016   Vitreous hemorrhage of left eye (HCC) 08/01/2019   Past Surgical History:  Procedure Laterality Date   APPENDECTOMY     BREAST SURGERY Left    CARDIAC CATHETERIZATION N/A 03/15/2016   Procedure: Left Heart Cath and Coronary Angiography;  Surgeon: Victory LELON Sharps, MD;  Location: [facility] INVASIVE CV LAB;  Service: Cardiovascular;  Laterality: N/A;   CARDIAC CATHETERIZATION N/A 03/15/2016   Procedure: Coronary Stent Intervention;  Surgeon: Victory LELON Sharps, MD;  Location: [facility] INVASIVE CV LAB;  Service: Cardiovascular;  Laterality: N/A;   CARDIOVERSION N/A 02/04/2023   Procedure: CARDIOVERSION;  Surgeon: Stacia Diannah SQUIBB, MD;  Location: [facility] ORS;  Service: Cardiovascular;  Laterality: N/A;   COLONOSCOPY     remote   COLONOSCOPY N/A 05/26/2016   Procedure: COLONOSCOPY;  Surgeon: Lamar CHRISTELLA Hollingshead, MD;  Location: [facility] ENDO SUITE;  Service: Endoscopy;   Laterality: N/A;  845   COLONOSCOPY N/A 06/17/2023   Procedure: COLONOSCOPY;  Surgeon: Hollingshead Lamar CHRISTELLA, MD;  Location: [facility];  Service: Endoscopy;  Laterality: N/A;   ENTEROSCOPY N/A 09/06/2023   Procedure: ENTEROSCOPY;  Surgeon: Eartha Angelia Sieving, MD;  Location: [facility];  Service: Gastroenterology;  Laterality: N/A;   ESOPHAGOGASTRODUODENOSCOPY N/A 05/26/2016   Procedure: ESOPHAGOGASTRODUODENOSCOPY (EGD);  Surgeon: Lamar CHRISTELLA Hollingshead, MD;  Location: [facility];  Service: Endoscopy;  Laterality: N/A;   ESOPHAGOGASTRODUODENOSCOPY N/A 06/16/2023   Procedure: EGD (ESOPHAGOGASTRODUODENOSCOPY);  Surgeon: Cinderella Deatrice F[place]CON, MD;  Location: [facility];  Service: Endoscopy;  Laterality: N/A;   ESOPHAGOGASTRODUODENOSCOPY N/A 09/06/2023   Procedure: EGD (ESOPHAGOGASTRODUODENOSCOPY);  Surgeon: Eartha Angelia, Sieving, MD;  [facility]: [facility];  Service: Gastroenterology;  Laterality: N/A;  with possible dilation   ESOPHAGOGASTRODUODENOSCOPY (EGD) WITH PROPOFOL  N/A 11/22/2022   Procedure: ESOPHAGOGASTRODUODENOSCOPY (EGD) WITH PROPOFOL ;  Surgeon: Cindie Carlin POUR, DO;  [facility]: [facility];  Service: Endoscopy;  Laterality: N/A;   EYE SURGERY     EYE SURGERY Left    Dr. Loretha   HEMOSTASIS CLIP PLACEMENT  06/17/2023   Procedure: CONTROL OF HEMORRHAGE, GI TRACT, ENDOSCOPIC, BY CLIPPING OR OVERSEWING;  Surgeon: Hollingshead Lamar CHRISTELLA, MD;  Location: [facility];  Service: Endoscopy;;   HOT HEMOSTASIS  11/22/2022   Procedure: HOT HEMOSTASIS (ARGON PLASMA COAGULATION/BICAP);  Surgeon: Cindie Carlin POUR, DO;  [facility]: [facility];  Service: Endoscopy;;   HOT HEMOSTASIS  06/16/2023   Procedure: EGD, WITH ARGON PLASMA COAGULATION;  Surgeon: Cinderella Deatrice FALCON, MD;  Location: [facility];  Service: Endoscopy;;   MALONEY DILATION N/A 05/26/2016   Procedure: AGAPITO DILATION;  Surgeon: Lamar CHRISTELLA Hollingshead, MD;  [facility]: [facility];  Service: Endoscopy;  Laterality: N/A;   POLYPECTOMY   06/17/2023   Procedure: POLYPECTOMY;  Surgeon: Hollingshead Lamar CHRISTELLA, MD;  [facility];  Service: Endoscopy;;   SCLEROTHERAPY  06/17/2023   Procedure: MATIAS;  Surgeon: Hollingshead Lamar CHRISTELLA, MD;  [facility];  Service: Endoscopy;;   TEE WITHOUT CARDIOVERSION N/A 02/04/2023   Procedure: TRANSESOPHAGEAL ECHOCARDIOGRAM (TEE);  Surgeon: Mallipeddi, Vishnu P, MD;  Location: [facility];  Service: Cardiovascular;  Laterality: N/A;   TOOTH EXTRACTION N/A 01/15/2022   Procedure: DENTAL RESTORATION/EXTRACTIONS;  Surgeon: Sheryle Hamilton, DMD;  Location: [facility] OR;  Service: Oral Surgery;  Laterality: N/A;   VITRECTOMY Left 10/03/2019   Dr. Elner, Vitrectomy, Focal Laser, Removal of Silicone Oil     SOCIAL HISTORY:  Social History   Socioeconomic History   Marital status: Divorced    Spouse name: Not on file   Number of children: 1   Years of education: Not on file   Highest education level: Not on file  Occupational History   Occupation: Retired  Tobacco Use   Smoking status: Former    Current packs/day: 0.00    Types: Cigarettes    Quit date: 12/28/2015    Years  since quitting: 7.8    Passive exposure: Current   Smokeless tobacco: Never   Tobacco comments:    Patient quit within the last 10 years  Vaping Use   Vaping status: Never Used  Substance and Sexual Activity   Alcohol use: Not Currently   Drug use: Never   Sexual activity: Not Currently  Other Topics Concern   Not on file  Social History Narrative   ** Merged History Encounter **       Social Drivers of Health   Financial Resource Strain: Low Risk  (11/26/2022)   Overall Financial Resource Strain (CARDIA)    Difficulty of Paying Living Expenses: Not hard at all  Food Insecurity: No Food Insecurity (11/07/2023)   Hunger Vital Sign    Worried About Running Out of Food in the Last Year: Never true    Ran Out of Food in the Last Year: Never true  Transportation Needs: No Transportation Needs (11/07/2023)   PRAPARE  - Administrator, Civil Service (Medical): No    Lack of Transportation (Non-Medical): No  Physical Activity: Not on file  Stress: No Stress Concern Present (11/26/2022)   [employer] of Occupational Health - Occupational Stress Questionnaire    Feeling of Stress : Not at all  Social Connections: Moderately Integrated (11/07/2023)   Social Connection and Isolation Panel    Frequency of Communication with Friends and Family: More than three times a week    Frequency of Social Gatherings with Friends and Family: More than three times a week    Attends Religious Services: 1 to 4 times per year    Active Member of [employer] or Organizations: Yes    Attends Banker Meetings: 1 to 4 times per year    Marital Status: Divorced  Catering manager Violence: Not At Risk (11/07/2023)   Humiliation, Afraid, Rape, and Kick questionnaire    Fear of Current or Ex-Partner: No    Emotionally Abused: No    Physically Abused: No    Sexually Abused: No    FAMILY HISTORY:  Family History  Problem Relation Age of Onset   Heart disease Mother    COPD Father    Cancer Sister        unknown primary   Cancer Brother        unknown primary   Cancer Brother    Hypertension Son    Colon cancer Neg Hx     CURRENT MEDICATIONS:  Outpatient Encounter Medications as of 11/16/2023  Medication Sig   acetaminophen  (TYLENOL ) 500 MG tablet Take 1,000 mg by mouth every 8 (eight) hours as needed for mild pain or moderate pain.   albuterol  (PROVENTIL ) (2.5 MG/3ML) 0.083% nebulizer solution Take 2.5 mg by nebulization every 6 (six) hours as needed for shortness of breath or wheezing.   albuterol  (VENTOLIN  HFA) 108 (90 Base) MCG/ACT inhaler Inhale 2 puffs into the lungs every 4 (four) hours as needed.   amiodarone  (PACERONE ) 200 MG tablet Take 1 Tablet ( 200 mg ) Daily and none on Sunday   amLODipine  (NORVASC ) 5 MG tablet Take 5 mg by mouth daily.   apixaban  (ELIQUIS ) 2.5 MG TABS tablet Take 1  tablet (2.5 mg total) by mouth 2 (two) times daily.   aspirin  EC 81 MG tablet Take 1 tablet (81 mg total) by mouth daily. Swallow whole.   atropine  1 % ophthalmic solution Apply to eye.   clobetasol (TEMOVATE) 0.05 % external solution Apply 1 Application topically 2 (  two) times daily as needed.   erythromycin ophthalmic ointment Apply to eye.   ferrous sulfate  324 MG TBEC Take 324 mg by mouth.   levothyroxine  (SYNTHROID ) 75 MCG tablet Take 75 mcg by mouth daily.   linaclotide  (LINZESS ) 290 MCG CAPS capsule Take 1 capsule (290 mcg total) by mouth daily before breakfast.   nitroGLYCERIN  (NITROSTAT ) 0.4 MG SL tablet Place 1 tablet (0.4 mg total) under the tongue every 5 (five) minutes as needed for chest pain.   ofloxacin  (OCUFLOX ) 0.3 % ophthalmic solution PLACE ONE DROP IN THE RIGHT EYE FOUR TIMES DAILY FOR FIVE DAYS   pantoprazole  (PROTONIX ) 40 MG tablet Take 1 tablet (40 mg total) by mouth 2 (two) times daily.   prednisoLONE  acetate (PRED FORTE ) 1 % ophthalmic suspension Apply 1 drop to eye.   rosuvastatin  (CRESTOR ) 20 MG tablet Take 20 mg by mouth daily.   sertraline  (ZOLOFT ) 25 MG tablet Take 25 mg by mouth daily.   timolol (TIMOPTIC) 0.5 % ophthalmic solution SMARTSIG:In Eye(s)   traZODone  (DESYREL ) 50 MG tablet Take 50 mg by mouth at bedtime.   TRELEGY ELLIPTA 200-62.5-25 MCG/ACT AEPB Inhale 1 puff into the lungs daily.   No facility-administered encounter medications on file as of 11/16/2023.    ALLERGIES:  Allergies  Allergen Reactions   Povidone-Iodine Other (See Comments)    Burning   Sulfa Antibiotics Other (See Comments)    Unknown    Sulfamethoxazole Other (See Comments)    Unknown      PHYSICAL EXAM:  ECOG PERFORMANCE STATUS: 1 - Symptomatic but completely ambulatory    Vitals:   11/16/23 1305 11/16/23 1310  BP: (!) 151/71 137/66  Pulse: 64   Resp: 16   Temp: (!) 97.5 F (36.4 C)   SpO2: 97%    Filed Weights   11/16/23 1305  Weight: 135 lb 5.8 oz (61.4 kg)      Physical Exam Constitutional:      Appearance: Normal appearance.  Eyes:     Comments: Right eye swollen with erythema.  Cardiovascular:     Rate and Rhythm: Normal rate and regular rhythm.  Pulmonary:     Effort: Pulmonary effort is normal.     Breath sounds: Normal breath sounds.  Abdominal:     General: Bowel sounds are normal.     Palpations: Abdomen is soft.  Musculoskeletal:        General: No swelling. Normal range of motion.  Neurological:     Mental Status: She is alert and oriented to person, place, and time. Mental status is at baseline.      LABORATORY DATA:  I have reviewed the labs as listed.  CBC    Component Value Date/Time   WBC 5.0 11/14/2023 1339   RBC 4.12 11/14/2023 1339   HGB 12.3 11/14/2023 1339   HCT 39.5 11/14/2023 1339   PLT 92 (L) 11/14/2023 1339   MCV 95.9 11/14/2023 1339   MCH 29.9 11/14/2023 1339   MCHC 31.1 11/14/2023 1339   RDW 18.7 (H) 11/14/2023 1339   LYMPHSABS 1.0 11/07/2023 0243   MONOABS 0.4 11/07/2023 0243   EOSABS 0.1 11/07/2023 0243   BASOSABS 0.0 11/07/2023 0243      Latest Ref Rng & Units 11/14/2023    1:39 PM 11/07/2023    2:43 AM 11/06/2023   10:24 PM  CMP  Glucose 70 - 99 mg/dL 882  87  96   BUN 8 - 23 mg/dL 37  28  27   Creatinine  0.44 - 1.00 mg/dL 8.25  8.23  8.12   Sodium 135 - 145 mmol/L 134  141  142   Potassium 3.5 - 5.1 mmol/L 4.4  4.3  4.6   Chloride 98 - 111 mmol/L 108  114  113   CO2 22 - 32 mmol/L 19  18  19    Calcium  8.9 - 10.3 mg/dL 9.0  8.9  9.0   Total Protein 6.5 - 8.1 g/dL 7.0     Total Bilirubin 0.0 - 1.2 mg/dL 0.4     Alkaline Phos 38 - 126 U/L 47     AST 15 - 41 U/L 38     ALT 0 - 44 U/L 20       DIAGNOSTIC IMAGING:  I have independently reviewed the relevant imaging and discussed with the patient.  ASSESSMENT & PLAN: Assessment & Plan Vitamin D  deficiency - Patient was started on vitamin D  1000 units daily at her last visit -No recent vitamin D  level drawn. -We will draw this at  her next visit.   Iron  deficiency anemia, unspecified iron  deficiency anemia type - Onset of anemia noted on 05/19/2021, with Hgb 8.0/MCV 101.4.  Prior to this, she had Hgb 12.1 in July 2022.  Labs sent by PCP (05/11/2021): Hgb 9.2/MCV 96.  Creatinine was at baseline 1.38. - Record review does show severe anemia in December 2017/January 2018 with Hgb 6.5, which was due to iron  deficiency of unknown source.  She received PRBC x2. 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  B12/methylmalonic acid, folate normal.  Normal SPEP/immunofixation.  DAT, haptoglobin, reticulocytes normal.  CMP showed baseline CKD stage IIIa/b - She was Hemoccult negative in January 2018.  Most recent Hemoccult stool negative x2. - She takes Eliquis  for atrial fibrillation - She has been taking oral iron  supplements with only mild GI discomfort. - She has occasional bright red blood in the toilet after straining for bowel movements.  No melena or epistaxis.  - She has some chronic residual fatigue, but otherwise improved after IV iron  -Reviewed most recent lab work which shows improvement of her iron  levels. -I do not think that she needs any additional iron  at this time but we did discuss that given she was recently hospitalized, I would recommend follow-up in the next 3 months or so to make sure that these were accurate levels. -Her copper  level has improved dramatically and we discussed stopping copper  supplements for now.  Will repeat those in 4 months.  Thrombocytopenic disorder (HCC) - Seen at the request of Dr. Shona for platelet count of 62.  She has some easy bruising. - She is on Eliquis  for atrial fibrillation. - Hematologic work-up showed SPEP negative.  B12, methylmalonic acid, copper  and folic acid   levels were normal when checked in March 2021.  LDH was normal.  White count and platelet count was normal. - ANA was positive with a dilution of 1 and 1280.  (She has positive Raynaud's phenomena.  Evaluated by rheumatology.)  Rheumatoid factor is negative.  Hepatitis panel was negative. - She admits to easy bruising but denies any signs or symptoms of major blood loss    - We reviewed her most recent CBC which showed improvement of her platelet counts to 92,000. - Differential diagnosis favors pseudothrombocytopenia due to platelet clumping, versus ITP, versus early MDS -Continue  follow-up with labs in 4 months.  Tobacco abuse - Smokes 2 PPD x60 years - CT chest lung cancer screening in November 2022 was lung RADS 2, benign -She is supposed to get low-dose CT scan by her PCP.   All questions were answered. The patient knows to call the clinic with any problems, questions or concerns.  Medical decision making: Moderate  Time spent on visit: I spent 20 minutes counseling the patient face to face. The total time spent in the appointment was 30 minutes and more than 50% was on counseling.   Delon FORBES Hope, NP  08/28/2021 12:28 PM

## 2023-11-16 NOTE — Assessment & Plan Note (Addendum)
-   Onset of anemia noted on 05/19/2021, with Hgb 8.0/MCV 101.4.  Prior to this, she had Hgb 12.1 in July 2022.  Labs sent by PCP (05/11/2021): Hgb 9.2/MCV 96.  Creatinine was at baseline 1.38. - Record review does show severe anemia in December 2017/January 2018 with Hgb 6.5, which was due to iron  deficiency of unknown source.  She received PRBC x2. 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  B12/methylmalonic acid, folate normal.  Normal SPEP/immunofixation.  DAT, haptoglobin, reticulocytes normal.  CMP showed baseline CKD stage IIIa/b - She was Hemoccult negative in January 2018.  Most recent Hemoccult stool negative x2. - She takes Eliquis  for atrial fibrillation - She has been taking oral iron  supplements with only mild GI discomfort. - She has occasional bright red blood in the toilet after straining for bowel movements.  No melena or epistaxis.  - She has some chronic residual fatigue, but otherwise improved after IV iron  -Reviewed most recent lab work which shows improvement of her iron  levels. -I do not think that she needs any additional iron  at this time but we did discuss that given she was recently hospitalized, I would recommend follow-up in the next 3 months or so to make sure that these were accurate levels. -Her copper  level has improved dramatically and we discussed stopping copper  supplements for now.  Will repeat those in 4 months.

## 2023-11-17 ENCOUNTER — Ambulatory Visit

## 2023-11-17 DIAGNOSIS — H35341 Macular cyst, hole, or pseudohole, right eye: Secondary | ICD-10-CM | POA: Diagnosis not present

## 2023-11-22 DIAGNOSIS — E039 Hypothyroidism, unspecified: Secondary | ICD-10-CM | POA: Diagnosis not present

## 2023-11-22 DIAGNOSIS — D500 Iron deficiency anemia secondary to blood loss (chronic): Secondary | ICD-10-CM | POA: Diagnosis not present

## 2023-11-22 DIAGNOSIS — I7300 Raynaud's syndrome without gangrene: Secondary | ICD-10-CM | POA: Diagnosis not present

## 2023-11-22 DIAGNOSIS — N184 Chronic kidney disease, stage 4 (severe): Secondary | ICD-10-CM | POA: Diagnosis not present

## 2023-11-22 DIAGNOSIS — J449 Chronic obstructive pulmonary disease, unspecified: Secondary | ICD-10-CM | POA: Diagnosis not present

## 2023-11-22 DIAGNOSIS — I10 Essential (primary) hypertension: Secondary | ICD-10-CM | POA: Diagnosis not present

## 2023-11-22 DIAGNOSIS — I4891 Unspecified atrial fibrillation: Secondary | ICD-10-CM | POA: Diagnosis not present

## 2023-11-22 DIAGNOSIS — E782 Mixed hyperlipidemia: Secondary | ICD-10-CM | POA: Diagnosis not present

## 2023-11-22 DIAGNOSIS — F5101 Primary insomnia: Secondary | ICD-10-CM | POA: Diagnosis not present

## 2023-11-22 DIAGNOSIS — E559 Vitamin D deficiency, unspecified: Secondary | ICD-10-CM | POA: Diagnosis not present

## 2023-11-22 DIAGNOSIS — D509 Iron deficiency anemia, unspecified: Secondary | ICD-10-CM | POA: Diagnosis not present

## 2023-11-22 DIAGNOSIS — K219 Gastro-esophageal reflux disease without esophagitis: Secondary | ICD-10-CM | POA: Diagnosis not present

## 2023-11-23 ENCOUNTER — Ambulatory Visit (HOSPITAL_COMMUNITY)
Admission: RE | Admit: 2023-11-23 | Discharge: 2023-11-23 | Disposition: A | Discharge status: Home / Self Care | Source: Ambulatory Visit | Attending: Internal Medicine | Admitting: Internal Medicine

## 2023-11-23 DIAGNOSIS — I2510 Atherosclerotic heart disease of native coronary artery without angina pectoris: Secondary | ICD-10-CM | POA: Diagnosis not present

## 2023-11-23 LAB — ECHOCARDIOGRAM COMPLETE
Area-P 1/2: 1.89 cm2
S' Lateral: 2.7 cm

## 2023-11-23 NOTE — Progress Notes (Signed)
*  PRELIMINARY RESULTS* Echocardiogram 2D Echocardiogram has been performed.  Patricia Jackson 11/23/2023, 9:31 AM

## 2023-11-25 ENCOUNTER — Encounter: Payer: Self-pay | Admitting: Nurse Practitioner

## 2023-11-25 ENCOUNTER — Encounter: Attending: Internal Medicine | Admitting: Nurse Practitioner

## 2023-11-25 VITALS — BP 126/82 | HR 72 | Ht 68.0 in | Wt 137.0 lb

## 2023-11-25 DIAGNOSIS — I10 Essential (primary) hypertension: Secondary | ICD-10-CM | POA: Diagnosis not present

## 2023-11-25 DIAGNOSIS — I6523 Occlusion and stenosis of bilateral carotid arteries: Secondary | ICD-10-CM | POA: Insufficient documentation

## 2023-11-25 DIAGNOSIS — I2510 Atherosclerotic heart disease of native coronary artery without angina pectoris: Secondary | ICD-10-CM | POA: Diagnosis not present

## 2023-11-25 DIAGNOSIS — R42 Dizziness and giddiness: Secondary | ICD-10-CM | POA: Diagnosis not present

## 2023-11-25 DIAGNOSIS — I4891 Unspecified atrial fibrillation: Secondary | ICD-10-CM | POA: Diagnosis not present

## 2023-11-25 DIAGNOSIS — N1832 Chronic kidney disease, stage 3b: Secondary | ICD-10-CM | POA: Insufficient documentation

## 2023-11-25 DIAGNOSIS — D696 Thrombocytopenia, unspecified: Secondary | ICD-10-CM | POA: Insufficient documentation

## 2023-11-25 DIAGNOSIS — E785 Hyperlipidemia, unspecified: Secondary | ICD-10-CM | POA: Insufficient documentation

## 2023-11-25 DIAGNOSIS — I4892 Unspecified atrial flutter: Secondary | ICD-10-CM | POA: Insufficient documentation

## 2023-11-25 DIAGNOSIS — Z79899 Other long term (current) drug therapy: Secondary | ICD-10-CM | POA: Insufficient documentation

## 2023-11-25 MED ORDER — EZETIMIBE 10 MG PO TABS: 10.0000 mg | ORAL_TABLET | Freq: Every day | ORAL | 3 refills | Status: DC

## 2023-11-25 NOTE — Patient Instructions (Addendum)
 Medication Instructions:  Your physician has recommended you make the following change in your medication:  Please start Zetia  10 Mg daily   Labwork: In 1-2 months at [facility]   Testing/Procedures: None   Follow-Up: Your physician recommends that you schedule a follow-up appointment in: 2-3 months   Any Other Special Instructions Will Be Listed Below (If Applicable).  If you need a refill on your cardiac medications before your next appointment, please call your pharmacy.

## 2023-11-25 NOTE — Progress Notes (Signed)
 Cardiology Office Note   Date: 11/25/2023 ID:  Patricia Jackson, Patricia Jackson [birth year], MRN [id number] PCP: Shona Norleen PEDLAR, MD  [facility] Providers Cardiologist:  Alvan Carrier, MD Electrophysiologist:  Danelle Birmingham, MD     History of Present Illness Patricia Jackson is a 83 y.o. female with a PMH of CAD, A-fib/A-flutter, HTN, hyperlipidemia, CKD, anemia and hx of recurrent anemia d/t hx of AVM's, history of GI bleed, COPD, Raynaud's syndrome, carotid artery stenosis, and hx of breast cancer, who presents today for hospital follow-up.   Previous cardiovascular history of drug-eluting stent to left circumflex in 2017 in setting of NSTEMI.  History of DCCV in November 2024.  Last seen by Dr. Alvan on May 12, 2023. Was doing well at the time.   Hospitalized 11/2023 d/t back pain as well as presented with pain that radiated down both arms and denied any chest pain or shortness of breath, had elevated troponins.  Appears she had chronic troponin elevations.  Troponins have been in the 20s-30s since October 2024.  Outpatient echocardiogram arranged. Was r/o for ACS and d/c in stable condition.   Today she presents for hospital follow-up.  She states she gets dizzy/lightheaded when standing up, overall doing well. Denies any chest pain, shortness of breath, palpitations, syncope, presyncope, orthopnea, PND, swelling or significant weight changes, acute bleeding, or claudication. Son says she goes quickly when changing positions and encourages her often to drink more water . She is followed by a Hem/Onc specialist for her thrombocytopenia.   ROS: Negative. See HPI.   Studies Reviewed  EKG: EKG is not ordered today.   Echo 11/2023:   1. Left ventricular ejection fraction, by estimation, is 60 to 65%. The  left ventricle has normal function. The left ventricle has no regional  wall motion abnormalities. Left ventricular diastolic parameters are  consistent with Grade II diastolic   dysfunction (pseudonormalization). Elevated left atrial pressure.   2. Right ventricular systolic function is normal. The right ventricular  size is normal. There is normal pulmonary artery systolic pressure.   3. Left atrial size was moderately dilated.   4. The mitral valve is abnormal. Trivial mitral valve regurgitation. No  evidence of mitral stenosis. Moderate to severe mitral annular  calcification.   5. The aortic valve is tricuspid. Aortic valve regurgitation is not  visualized. No aortic stenosis is present.   6. The inferior vena cava is normal in size with greater than 50%  respiratory variability, suggesting right atrial pressure of 3 mmHg.   Comparison(s): No significant change from prior study.  Carotid duplex 02/2023:  IMPRESSION: Right:   Color duplex indicates moderate heterogeneous and calcified plaque, with no hemodynamically significant stenosis by duplex criteria in the extracranial cerebrovascular circulation.   Left:   Heterogeneous, partially calcified and irregular plaque at the left carotid bifurcation contributes to 50%-69% stenosis by established duplex criteria  LHC 03/2016:  The left ventricular ejection fraction is 55-65% by visual estimate. The left ventricular systolic function is normal. A STENT RESOLUTE ONYX 2.75X12 drug eluting stent was successfully placed, and does not overlap previously placed stent. Prox Cx to Mid Cx lesion, 99 %stenosed. Post intervention, there is a 0% residual stenosis.   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 Otherwise widely patent coronary arteries Successful angioplasty and stenting of the proximal circumflex reducing a greater than 95% stenosis to 0% with TIMI grade 3 flow. Resolute Onyx 2.75 x 12 mm stent deployed at 14 atm.   Recommendations:  Aspirin , Brilinta , and Eliquis  x 1 month then drop aspirin . See further instruction below for 3 months and beyond. Eliquis   should start in AM as long as no recurrent AF/Afl. Would also consider switch to Plavix  at 3 months to decrease the risk of bleeding on Eliquis .   Physical Exam VS:  BP 126/82   Pulse 72   Ht 5' 8 (1.727 m)   Wt 137 lb (62.1 kg)   SpO2 99%   BMI 20.83 kg/m        Wt Readings from Last 3 Encounters:  11/25/23 137 lb (62.1 kg)  11/16/23 135 lb 5.8 oz (61.4 kg)  11/07/23 137 lb 5.6 oz (62.3 kg)    GEN: Well nourished, well developed in no acute distress NECK: No JVD; No carotid bruits CARDIAC: S1/S2, RRR, no murmurs, rubs, gallops RESPIRATORY:  Clear to auscultation without rales, wheezing or rhonchi  ABDOMEN: Soft, non-tender, non-distended EXTREMITIES:  No edema; No deformity   ASSESSMENT AND PLAN  CAD Stable with no anginal symptoms. No indication for ischemic evaluation. Continue current medication regimen. Heart healthy diet and regular cardiovascular exercise encouraged. Starting Zetia  10 mg daily and will obtain FLP/LFT in 1-2 months. Continue rest of medication regimen.   A-fib/A-flutter Denies any tachycardia or palpitations. HR is well controlled today. Continue current medication regimen and continue low dose Eliquis . Denies any bleeding issues. Continue to follow-up with EP.   HTN BP is stable. Discussed to monitor BP at home at least 2 hours after medications and sitting for 5-10 minutes. No medication changes at this time. Heart healthy diet and regular cardiovascular exercise encouraged.   Carotid artery stenosis, HLD, medication management Most recent LDL 73 from 3 weeks ago. LDL < 70. Carotid duplex 02/2023 revealed non hemodynamically significant stenosis along right and 50-69% stenosis at left carotid bifurcation.  Will begin Zetia  10 mg daily and repeat FLP/LFT in 1-2 months. Continue rest of medication regimen.   Orthostatic dizziness Most likely d/t dehydration and changing positions quickly. Discussed care precautions. Will work on lifestyle changes. If  no improvement by next office, will obtain orthostatics. Continue to follow with PCP.  6. CKD stage IIIb/IV Most recent labs overall are stable. Avoid nephrotoxic agents. Follow-up with PCP and Nephrology.   7. Thrombocytopenia Denies any bleeding issues. Follow-up with Hem/Onc   Dispo: Care and ED precautions discussed. Follow-up with MD/APP in 2-3 months or sooner if anything changes.   Signed, Almarie Crate, NP

## 2023-12-05 ENCOUNTER — Emergency Department (HOSPITAL_COMMUNITY)
Admission: EM | Admit: 2023-12-05 | Discharge: 2023-12-05 | Disposition: A | Discharge status: Home / Self Care | Attending: Emergency Medicine | Admitting: Emergency Medicine

## 2023-12-05 ENCOUNTER — Encounter (HOSPITAL_COMMUNITY): Payer: Self-pay | Admitting: Emergency Medicine

## 2023-12-05 ENCOUNTER — Emergency Department (HOSPITAL_COMMUNITY)

## 2023-12-05 ENCOUNTER — Other Ambulatory Visit: Payer: Self-pay

## 2023-12-05 DIAGNOSIS — R7989 Other specified abnormal findings of blood chemistry: Secondary | ICD-10-CM | POA: Diagnosis not present

## 2023-12-05 DIAGNOSIS — J449 Chronic obstructive pulmonary disease, unspecified: Secondary | ICD-10-CM | POA: Insufficient documentation

## 2023-12-05 DIAGNOSIS — I700 Atherosclerosis of aorta: Secondary | ICD-10-CM | POA: Diagnosis not present

## 2023-12-05 DIAGNOSIS — R0602 Shortness of breath: Secondary | ICD-10-CM | POA: Insufficient documentation

## 2023-12-05 DIAGNOSIS — Z7982 Long term (current) use of aspirin: Secondary | ICD-10-CM | POA: Insufficient documentation

## 2023-12-05 DIAGNOSIS — Z87891 Personal history of nicotine dependence: Secondary | ICD-10-CM | POA: Diagnosis not present

## 2023-12-05 DIAGNOSIS — D696 Thrombocytopenia, unspecified: Secondary | ICD-10-CM | POA: Diagnosis not present

## 2023-12-05 DIAGNOSIS — R519 Headache, unspecified: Secondary | ICD-10-CM | POA: Diagnosis not present

## 2023-12-05 DIAGNOSIS — R918 Other nonspecific abnormal finding of lung field: Secondary | ICD-10-CM | POA: Diagnosis not present

## 2023-12-05 DIAGNOSIS — R059 Cough, unspecified: Secondary | ICD-10-CM | POA: Insufficient documentation

## 2023-12-05 DIAGNOSIS — Z7901 Long term (current) use of anticoagulants: Secondary | ICD-10-CM | POA: Diagnosis not present

## 2023-12-05 DIAGNOSIS — N184 Chronic kidney disease, stage 4 (severe): Secondary | ICD-10-CM | POA: Diagnosis not present

## 2023-12-05 LAB — CBC WITH DIFFERENTIAL/PLATELET
Abs Immature Granulocytes: 0.05 K/uL (ref 0.00–0.07)
Basophils Absolute: 0 K/uL (ref 0.0–0.1)
Basophils Relative: 0 %
Eosinophils Absolute: 0.1 K/uL (ref 0.0–0.5)
Eosinophils Relative: 1 %
HCT: 38.1 % (ref 36.0–46.0)
Hemoglobin: 12.4 g/dL (ref 12.0–15.0)
Immature Granulocytes: 1 %
Lymphocytes Relative: 11 %
Lymphs Abs: 0.8 K/uL (ref 0.7–4.0)
MCH: 30.3 pg (ref 26.0–34.0)
MCHC: 32.5 g/dL (ref 30.0–36.0)
MCV: 93.2 fL (ref 80.0–100.0)
Monocytes Absolute: 0.6 K/uL (ref 0.1–1.0)
Monocytes Relative: 9 %
Neutro Abs: 5.2 K/uL (ref 1.7–7.7)
Neutrophils Relative %: 78 %
Platelets: 82 K/uL — ABNORMAL LOW (ref 150–400)
RBC: 4.09 MIL/uL (ref 3.87–5.11)
RDW: 17.1 % — ABNORMAL HIGH (ref 11.5–15.5)
WBC: 6.6 K/uL (ref 4.0–10.5)
nRBC: 0 % (ref 0.0–0.2)

## 2023-12-05 LAB — BASIC METABOLIC PANEL WITH GFR
Anion gap: 8 (ref 5–15)
BUN: 21 mg/dL (ref 8–23)
CO2: 21 mmol/L — ABNORMAL LOW (ref 22–32)
Calcium: 8.7 mg/dL — ABNORMAL LOW (ref 8.9–10.3)
Chloride: 110 mmol/L (ref 98–111)
Creatinine, Ser: 1.3 mg/dL — ABNORMAL HIGH (ref 0.44–1.00)
GFR, Estimated: 42 mL/min — ABNORMAL LOW (ref >60)
Glucose, Bld: 96 mg/dL (ref 70–99)
Potassium: 4 mmol/L (ref 3.5–5.1)
Sodium: 139 mmol/L (ref 135–145)

## 2023-12-05 LAB — TROPONIN I (HIGH SENSITIVITY)
Troponin I (High Sensitivity): 4 ng/L (ref <18)
Troponin I (High Sensitivity): 4 ng/L (ref <18)

## 2023-12-05 LAB — TYPE AND SCREEN
ABO/RH(D): A POS
Antibody Screen: NEGATIVE

## 2023-12-05 LAB — BRAIN NATRIURETIC PEPTIDE: B Natriuretic Peptide: 151 pg/mL — ABNORMAL HIGH (ref 0.0–100.0)

## 2023-12-05 LAB — SARS CORONAVIRUS 2 BY RT PCR: SARS Coronavirus 2 by RT PCR: NEGATIVE

## 2023-12-05 MED ORDER — IPRATROPIUM-ALBUTEROL 0.5-2.5 (3) MG/3ML IN SOLN
3.0000 mL | Freq: Once | RESPIRATORY_TRACT | Status: AC
  Administered 2023-12-05: 3 mL via RESPIRATORY_TRACT
  Filled 2023-12-05: qty 3

## 2023-12-05 MED ORDER — ACETAMINOPHEN 325 MG PO TABS
650.0000 mg | ORAL_TABLET | Freq: Once | ORAL | Status: AC
  Administered 2023-12-05: 650 mg via ORAL
  Filled 2023-12-05: qty 2

## 2023-12-05 NOTE — ED Provider Notes (Signed)
 [place] [facility] EMERGENCY DEPARTMENT AT [facility] Provider Note   CSN: [id number] Arrival date & time: 12/05/23  1339     Patient presents with: Shortness of Breath   Patricia Jackson is a 83 y.o. female.  She is here with headache cough and shortness of breath it has been going on for 3 days.  Cough has been nonproductive.  No chest pain abdominal pain nausea vomiting diarrhea.  Has been constipated.  No fevers or chills.  Former smoker.  History of COPD.  Family member states the last time she was like that she needed a transfusion.  She is on blood thinners for A-fib.  {Add pertinent medical, surgical, social history, OB history to HPI:32947}  Shortness of Breath Severity:  Moderate Onset quality:  Gradual Duration:  3 days Timing:  Constant Progression:  Worsening Chronicity:  Recurrent Relieved by:  Nothing Worsened by:  Coughing and activity Ineffective treatments:  Rest Associated symptoms: cough and headaches   Associated symptoms: no abdominal pain, no chest pain, no fever, no hemoptysis, no sputum production, no vomiting and no wheezing        Prior to Admission medications   Medication Sig Start Date End Date Taking? Authorizing Provider  acetaminophen  (TYLENOL ) 500 MG tablet Take 1,000 mg by mouth every 8 (eight) hours as needed for mild pain or moderate pain. 03/21/17   [provider]  albuterol  (PROVENTIL ) (2.5 MG/3ML) 0.083% nebulizer solution Take 2.5 mg by nebulization every 6 (six) hours as needed for shortness of breath or wheezing.    [provider]  albuterol  (VENTOLIN  HFA) 108 (90 Base) MCG/ACT inhaler Inhale 2 puffs into the lungs every 4 (four) hours as needed. 10/14/23   [provider]  amiodarone  (PACERONE ) 200 MG tablet Take 1 Tablet ( 200 mg ) Daily and none on Sunday 10/25/23   Waddell Danelle ORN, MD  amLODipine  (NORVASC ) 5 MG tablet Take 5 mg by mouth daily. 08/15/23   [provider]  apixaban  (ELIQUIS ) 2.5 MG  TABS tablet Take 1 tablet (2.5 mg total) by mouth 2 (two) times daily. 10/25/23   Alvan Dorn FALCON, MD  aspirin  EC 81 MG tablet Take 1 tablet (81 mg total) by mouth daily. Swallow whole. 11/08/23   Vicci Rollo SAUNDERS, PA-C  clobetasol (TEMOVATE) 0.05 % external solution Apply 1 Application topically 2 (two) times daily as needed. 10/14/23   [provider]  erythromycin ophthalmic ointment Place 1 Application into the right eye once.    [provider]  ezetimibe  (ZETIA ) 10 MG tablet Take 1 tablet (10 mg total) by mouth daily. 11/25/23 02/23/24  Miriam Norris, NP  ferrous sulfate  324 MG TBEC Take 324 mg by mouth.    [provider]  levothyroxine  (SYNTHROID ) 75 MCG tablet Take 75 mcg by mouth daily. 10/20/23   [provider]  linaclotide  (LINZESS ) 290 MCG CAPS capsule Take 1 capsule (290 mcg total) by mouth daily before breakfast. 06/19/23   Ricky Fines, MD  nitroGLYCERIN  (NITROSTAT ) 0.4 MG SL tablet Place 1 tablet (0.4 mg total) under the tongue every 5 (five) minutes as needed for chest pain. 10/14/23   Alvan Dorn FALCON, MD  ofloxacin  (OCUFLOX ) 0.3 % ophthalmic solution PLACE ONE DROP IN THE RIGHT EYE FOUR TIMES DAILY FOR FIVE DAYS 11/09/23   [provider]  pantoprazole  (PROTONIX ) 40 MG tablet Take 1 tablet (40 mg total) by mouth 2 (two) times daily. 09/12/23 11/25/23  Maree Bracken D, DO  prednisoLONE  acetate (PRED FORTE )  1 % ophthalmic suspension Apply 1 drop to eye. 11/09/23 12/07/23  [provider]  rosuvastatin  (CRESTOR ) 20 MG tablet Take 20 mg by mouth daily. 02/26/17   [provider]  sertraline  (ZOLOFT ) 25 MG tablet Take 25 mg by mouth daily. 10/18/23   [provider]  timolol (TIMOPTIC) 0.5 % ophthalmic solution SMARTSIG:In Eye(s)    [provider]  traZODone  (DESYREL ) 50 MG tablet Take 50 mg by mouth at bedtime. 08/31/23   [provider]  DOMINIC BECK 200-62.5-25 MCG/ACT AEPB Inhale 1 puff into the  lungs daily. 11/02/22   [provider]    Allergies: Povidone-iodine, Sulfa antibiotics, and Sulfamethoxazole    Review of Systems  Constitutional:  Negative for fever.  Respiratory:  Positive for cough and shortness of breath. Negative for hemoptysis, sputum production and wheezing.   Cardiovascular:  Negative for chest pain.  Gastrointestinal:  Negative for abdominal pain and vomiting.  Neurological:  Positive for headaches.    Updated Vital Signs BP (!) 148/77 (BP Location: Left Arm)   Pulse 66   Temp 98.8 F (37.1 C) (Oral)   Resp 16   Ht 5' 8 (1.727 m)   Wt 62.1 kg   SpO2 99%   BMI 20.83 kg/m   Physical Exam Vitals and nursing note reviewed.  Constitutional:      General: She is not in acute distress.    Appearance: Normal appearance. She is well-developed.  HENT:     Head: Normocephalic and atraumatic.  Eyes:     Conjunctiva/sclera: Conjunctivae normal.  Cardiovascular:     Rate and Rhythm: Normal rate and regular rhythm.     Heart sounds: No murmur heard. Pulmonary:     Effort: Accessory muscle usage present. No respiratory distress.     Breath sounds: Normal breath sounds. No stridor. No wheezing.  Abdominal:     Palpations: Abdomen is soft.     Tenderness: There is no abdominal tenderness. There is no guarding or rebound.  Musculoskeletal:        General: No tenderness or deformity.     Cervical back: Neck supple.     Right lower leg: No tenderness. No edema.     Left lower leg: No tenderness. No edema.  Skin:    General: Skin is warm and dry.  Neurological:     General: No focal deficit present.     Mental Status: She is alert.     GCS: GCS eye subscore is 4. GCS verbal subscore is 5. GCS motor subscore is 6.     (all labs ordered are listed, but only abnormal results are displayed) Labs Reviewed - No data to display  EKG: None  Radiology: No results found.  {Document cardiac monitor, telemetry assessment procedure when  appropriate:32947} Procedures   Medications Ordered in the ED - No data to display    {Click here for ABCD2, HEART and other calculators REFRESH Note before signing:1}                              Medical Decision Making Amount and/or Complexity of Data Reviewed Labs: ordered. Radiology: ordered.   This patient complains of ***; this involves an extensive number of treatment Options and is a complaint that carries with it a high risk of complications and morbidity. The differential includes ***  I ordered, reviewed and interpreted labs, which included *** I ordered medication *** and reviewed PMP when indicated.  I ordered imaging studies which included *** and I independently    visualized and interpreted imaging which showed *** Additional history obtained from *** Previous records obtained and reviewed *** I consulted *** and discussed lab and imaging findings and discussed disposition.  Cardiac monitoring reviewed, *** Social determinants considered, *** Critical Interventions: ***  After the interventions stated above, I reevaluated the patient and found *** Admission and further testing considered, ***   {Document critical care time when appropriate  Document review of labs and clinical decision tools ie CHADS2VASC2, etc  Document your independent review of radiology images and any outside records  Document your discussion with family members, caretakers and with consultants  Document social determinants of health affecting pt's care  Document your decision making why or why not admission, treatments were needed:32947:::1}   Final diagnoses:  None    ED Discharge Orders     None

## 2023-12-05 NOTE — ED Triage Notes (Signed)
 Pt to the ED with complaints of a cough for the past few days with intermittent shortness of breath.

## 2023-12-06 ENCOUNTER — Encounter: Payer: Self-pay | Admitting: Internal Medicine

## 2023-12-06 ENCOUNTER — Encounter (HOSPITAL_COMMUNITY): Payer: Self-pay | Admitting: Oncology

## 2023-12-09 DIAGNOSIS — I10 Essential (primary) hypertension: Secondary | ICD-10-CM | POA: Diagnosis not present

## 2023-12-09 DIAGNOSIS — N184 Chronic kidney disease, stage 4 (severe): Secondary | ICD-10-CM | POA: Diagnosis not present

## 2023-12-09 DIAGNOSIS — E039 Hypothyroidism, unspecified: Secondary | ICD-10-CM | POA: Diagnosis not present

## 2023-12-09 DIAGNOSIS — J069 Acute upper respiratory infection, unspecified: Secondary | ICD-10-CM | POA: Diagnosis not present

## 2023-12-09 DIAGNOSIS — E782 Mixed hyperlipidemia: Secondary | ICD-10-CM | POA: Diagnosis not present

## 2023-12-09 DIAGNOSIS — D509 Iron deficiency anemia, unspecified: Secondary | ICD-10-CM | POA: Diagnosis not present

## 2023-12-09 DIAGNOSIS — F5101 Primary insomnia: Secondary | ICD-10-CM | POA: Diagnosis not present

## 2023-12-09 DIAGNOSIS — I4891 Unspecified atrial fibrillation: Secondary | ICD-10-CM | POA: Diagnosis not present

## 2023-12-09 DIAGNOSIS — J449 Chronic obstructive pulmonary disease, unspecified: Secondary | ICD-10-CM | POA: Diagnosis not present

## 2023-12-09 DIAGNOSIS — E559 Vitamin D deficiency, unspecified: Secondary | ICD-10-CM | POA: Diagnosis not present

## 2023-12-09 DIAGNOSIS — K219 Gastro-esophageal reflux disease without esophagitis: Secondary | ICD-10-CM | POA: Diagnosis not present

## 2023-12-09 DIAGNOSIS — D500 Iron deficiency anemia secondary to blood loss (chronic): Secondary | ICD-10-CM | POA: Diagnosis not present

## 2023-12-13 DIAGNOSIS — J441 Chronic obstructive pulmonary disease with (acute) exacerbation: Secondary | ICD-10-CM | POA: Diagnosis not present

## 2023-12-13 DIAGNOSIS — Z7689 Persons encountering health services in other specified circumstances: Secondary | ICD-10-CM | POA: Diagnosis not present

## 2023-12-15 ENCOUNTER — Ambulatory Visit

## 2023-12-20 ENCOUNTER — Other Ambulatory Visit: Payer: Self-pay | Admitting: *Deleted

## 2023-12-20 DIAGNOSIS — Z79899 Other long term (current) drug therapy: Secondary | ICD-10-CM | POA: Diagnosis not present

## 2023-12-20 DIAGNOSIS — E785 Hyperlipidemia, unspecified: Secondary | ICD-10-CM

## 2023-12-21 LAB — HEPATIC FUNCTION PANEL
ALT: 43 IU/L — ABNORMAL HIGH (ref 0–32)
AST: 52 IU/L — ABNORMAL HIGH (ref 0–40)
Albumin: 3.9 g/dL (ref 3.8–4.8)
Alkaline Phosphatase: 65 IU/L (ref 49–135)
Bilirubin Total: 0.3 mg/dL (ref 0.0–1.2)
Bilirubin, Direct: 0.13 mg/dL (ref 0.00–0.40)
Total Protein: 6.5 g/dL (ref 6.0–8.5)

## 2023-12-22 DIAGNOSIS — H35341 Macular cyst, hole, or pseudohole, right eye: Secondary | ICD-10-CM | POA: Diagnosis not present

## 2023-12-25 ENCOUNTER — Ambulatory Visit: Payer: Self-pay | Admitting: Nurse Practitioner

## 2023-12-25 DIAGNOSIS — Z79899 Other long term (current) drug therapy: Secondary | ICD-10-CM

## 2023-12-25 DIAGNOSIS — N1832 Chronic kidney disease, stage 3b: Secondary | ICD-10-CM

## 2023-12-25 DIAGNOSIS — E785 Hyperlipidemia, unspecified: Secondary | ICD-10-CM

## 2023-12-26 DIAGNOSIS — R809 Proteinuria, unspecified: Secondary | ICD-10-CM | POA: Diagnosis not present

## 2023-12-26 DIAGNOSIS — D631 Anemia in chronic kidney disease: Secondary | ICD-10-CM | POA: Diagnosis not present

## 2023-12-26 DIAGNOSIS — R768 Other specified abnormal immunological findings in serum: Secondary | ICD-10-CM | POA: Diagnosis not present

## 2023-12-26 DIAGNOSIS — N1832 Chronic kidney disease, stage 3b: Secondary | ICD-10-CM | POA: Diagnosis not present

## 2023-12-26 DIAGNOSIS — I129 Hypertensive chronic kidney disease with stage 1 through stage 4 chronic kidney disease, or unspecified chronic kidney disease: Secondary | ICD-10-CM | POA: Diagnosis not present

## 2023-12-26 DIAGNOSIS — N2581 Secondary hyperparathyroidism of renal origin: Secondary | ICD-10-CM | POA: Diagnosis not present

## 2023-12-28 ENCOUNTER — Encounter: Payer: Self-pay | Admitting: Internal Medicine

## 2023-12-28 ENCOUNTER — Encounter (HOSPITAL_COMMUNITY): Payer: Self-pay | Admitting: Oncology

## 2023-12-28 DIAGNOSIS — E782 Mixed hyperlipidemia: Secondary | ICD-10-CM | POA: Diagnosis not present

## 2023-12-28 DIAGNOSIS — J069 Acute upper respiratory infection, unspecified: Secondary | ICD-10-CM | POA: Diagnosis not present

## 2023-12-28 DIAGNOSIS — I10 Essential (primary) hypertension: Secondary | ICD-10-CM | POA: Diagnosis not present

## 2023-12-28 DIAGNOSIS — D500 Iron deficiency anemia secondary to blood loss (chronic): Secondary | ICD-10-CM | POA: Diagnosis not present

## 2023-12-28 DIAGNOSIS — K219 Gastro-esophageal reflux disease without esophagitis: Secondary | ICD-10-CM | POA: Diagnosis not present

## 2023-12-28 DIAGNOSIS — E039 Hypothyroidism, unspecified: Secondary | ICD-10-CM | POA: Diagnosis not present

## 2023-12-28 DIAGNOSIS — I4891 Unspecified atrial fibrillation: Secondary | ICD-10-CM | POA: Diagnosis not present

## 2023-12-28 DIAGNOSIS — F5101 Primary insomnia: Secondary | ICD-10-CM | POA: Diagnosis not present

## 2023-12-28 DIAGNOSIS — N184 Chronic kidney disease, stage 4 (severe): Secondary | ICD-10-CM | POA: Diagnosis not present

## 2023-12-28 DIAGNOSIS — D509 Iron deficiency anemia, unspecified: Secondary | ICD-10-CM | POA: Diagnosis not present

## 2023-12-28 DIAGNOSIS — J449 Chronic obstructive pulmonary disease, unspecified: Secondary | ICD-10-CM | POA: Diagnosis not present

## 2023-12-28 DIAGNOSIS — E559 Vitamin D deficiency, unspecified: Secondary | ICD-10-CM | POA: Diagnosis not present

## 2023-12-28 LAB — LIPID PANEL

## 2023-12-29 ENCOUNTER — Ambulatory Visit (INDEPENDENT_AMBULATORY_CARE_PROVIDER_SITE_OTHER): Admitting: Gastroenterology

## 2023-12-29 VITALS — BP 117/63 | HR 66 | Temp 97.8°F | Ht 68.0 in | Wt 134.8 lb

## 2023-12-29 DIAGNOSIS — Z860101 Personal history of adenomatous and serrated colon polyps: Secondary | ICD-10-CM | POA: Diagnosis not present

## 2023-12-29 DIAGNOSIS — K5900 Constipation, unspecified: Secondary | ICD-10-CM | POA: Diagnosis not present

## 2023-12-29 DIAGNOSIS — Z860100 Personal history of colon polyps, unspecified: Secondary | ICD-10-CM | POA: Insufficient documentation

## 2023-12-29 MED ORDER — LUBIPROSTONE 24 MCG PO CAPS: 24.0000 ug | ORAL_CAPSULE | Freq: Two times a day (BID) | ORAL | 3 refills | Status: DC

## 2023-12-29 NOTE — Patient Instructions (Signed)
 Today, I want you to do a bowel purge. You will take 1 capful of Miralax  in 8 ounces of juice or water  every hour for 6 doses (example: 1pm, 2pm, 3pm, etc)  Tomorrow, start Amitiza . Take this twice a day WITH FOOD to avoid nausea. This is for constipation. Stop Linzess , as it is not effective.  I will call you in a few weeks at a specified time and go over how you are doing; if improved, we can arrange the colonoscopy.  We will see you back in 3 months regardless!  I enjoyed seeing you again today! I value our relationship and want to provide genuine, compassionate, and quality care. You may receive a survey regarding your visit with me, and I welcome your feedback! Thanks so much for taking the time to complete this. I look forward to seeing you again.      Therisa MICAEL Stager, PhD, ANP-BC [facility]

## 2023-12-29 NOTE — Progress Notes (Signed)
 Gastroenterology Office Note     Primary Care Physician:  Shona Norleen PEDLAR, MD  Primary Gastroenterologist: Dr. Shaaron    Chief Complaint   Chief Complaint  Patient presents with   Follow-up    Follow up on anemia     History of Present Illness   Patricia Jackson is an 83 y.o. 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   She is due for a colonoscopy in September as had poor prep earlier this year, multiple polyps, and a large polyp removed distal to the IC valve with clip placement.   Last seen in July 2025. She was referred to Hematology due to IDA and further evaluation of thrombocytopenia. 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   Linzess  290 mcg daily not effective. Feels it is over a week since last BM. +flatus. No abdominal pain. Denies abdominal distension. No N/V. No hematochezia. Stool is dark with BM but this is chronic and is on iron .   Chronic SOB from COPD. No chest pain.   Stopped Zetia  recently as liver enzymes bumped up. Rechecking LFTs in 2 months per Cardiology.    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   EGD March 2025: normal esophagus, single non-bleeding angioectasia s/p APC, 2 non-bleeding angioectasias in duodenum s/p APC.      Colonoscopy March 2025: poor prep, diverticulosis, multiple polyps with six 8-30 mm polyps at IC valve removed with hot snare, 15x 30 mm polyp distal to IC valve with removal and clip placement, ink marked. Colonoscopy in 6 months (Sept 2025). Path with tubular adenomas.      EGD August 2024: -Nonobstructing Schatzki's ring -Small hiatal hernia -Irregular Z-line -Single bleeding angiectasia in the stomach s/p APC -2 nonbleeding angiectasia in the stomach s/p APC -Single nonbleeding angioectasia in the second portion of duodenum -Advise outpatient colonoscopy, start oral iron  therapy.  Continue PPI BID       Colonoscopy Feb 2018:multiple diverticula found in the sigmoid and descending colon. 2 sessile polyps removed from the hepatic flexure measuring 5-6 mm in size. Pathology revealed tubular adenomas    EGD FEb 2018: LA grade a esophagitis, no Barrett's. Esophagus was dilated with a 54 [place] Maloney dilator. She has small hiatal hernia   Past Medical History:  Diagnosis Date   A-fib [facility])    Atrial flutter (HCC)    On Eliquis  in 8/17 but discontinued after stent placement   Breast cancer (HCC)    remote   CAD in native artery 03/16/2016   COPD (chronic obstructive pulmonary disease) (HCC)    Dysrhythmia    Hypertension    Iron  deficiency anemia 06/12/2021   NSTEMI (non-ST elevated myocardial infarction) (HCC)    2017  S/P angioplasty with stent 03/15/16 DES Resolute, pLCX 03/16/2016   Vitreous hemorrhage of left eye (HCC) 08/01/2019    Past Surgical History:  Procedure Laterality Date   APPENDECTOMY     BREAST SURGERY Left    CARDIAC CATHETERIZATION N/A 03/15/2016   Procedure: Left Heart Cath and Coronary Angiography;  Surgeon: Victory LELON Sharps, MD;  Location: [facility] INVASIVE CV LAB;  Service: Cardiovascular;  Laterality: N/A;    CARDIAC CATHETERIZATION N/A 03/15/2016   Procedure: Coronary Stent Intervention;  Surgeon: Victory LELON Sharps, MD;  Location: [facility] INVASIVE CV LAB;  Service: Cardiovascular;  Laterality: N/A;   CARDIOVERSION N/A 02/04/2023   Procedure: CARDIOVERSION;  Surgeon: Stacia Diannah SQUIBB, MD;  Location: [facility] ORS;  Service: Cardiovascular;  Laterality: N/A;   COLONOSCOPY     remote   COLONOSCOPY N/A 05/26/2016   Procedure: COLONOSCOPY;  Surgeon: Lamar CHRISTELLA Hollingshead, MD;  Location: [facility];  Service: Endoscopy;  Laterality: N/A;  845   COLONOSCOPY N/A 06/17/2023   Procedure: COLONOSCOPY;  Surgeon: Hollingshead Lamar CHRISTELLA, MD;  Location: [facility];  Service: Endoscopy;  Laterality: N/A;   ENTEROSCOPY N/A 09/06/2023   Procedure: ENTEROSCOPY;  Surgeon: Eartha Angelia Sieving, MD;  Location: [facility];  Service: Gastroenterology;  Laterality: N/A;   ESOPHAGOGASTRODUODENOSCOPY N/A 05/26/2016   Procedure: ESOPHAGOGASTRODUODENOSCOPY (EGD);  Surgeon: Lamar CHRISTELLA Hollingshead, MD;  Location: [facility];  Service: Endoscopy;  Laterality: N/A;   ESOPHAGOGASTRODUODENOSCOPY N/A 06/16/2023   Procedure: EGD (ESOPHAGOGASTRODUODENOSCOPY);  Surgeon: Cinderella Deatrice FALCON, MD;  Location: [facility];  Service: Endoscopy;  Laterality: N/A;   ESOPHAGOGASTRODUODENOSCOPY N/A 09/06/2023   Procedure: EGD (ESOPHAGOGASTRODUODENOSCOPY);  Surgeon: Eartha Angelia, Sieving, MD;  Location: [facility];  Service: Gastroenterology;  Laterality: N/A;  with possible dilation   ESOPHAGOGASTRODUODENOSCOPY (EGD) WITH PROPOFOL  N/A 11/22/2022   Procedure: ESOPHAGOGASTRODUODENOSCOPY (EGD) WITH PROPOFOL ;  Surgeon: Cindie Carlin POUR, DO;  Location: [facility];  Service: Endoscopy;  Laterality: N/A;   EYE SURGERY     EYE SURGERY Left    Dr. Loretha   HEMOSTASIS CLIP PLACEMENT  06/17/2023   Procedure: CONTROL OF HEMORRHAGE, GI TRACT, ENDOSCOPIC, BY CLIPPING OR OVERSEWING;  Surgeon: Hollingshead Lamar CHRISTELLA, MD;  Location: [facility];  Service: Endoscopy;;   HOT  HEMOSTASIS  11/22/2022   Procedure: HOT HEMOSTASIS (ARGON PLASMA COAGULATION/BICAP);  Surgeon: Cindie Carlin POUR, DO;  Location: [facility];  Service: Endoscopy;;   HOT HEMOSTASIS  06/16/2023   Procedure: EGD, WITH ARGON PLASMA COAGULATION;  Surgeon: Cinderella Deatrice FALCON, MD;  Location: [facility];  Service: Endoscopy;;   MALONEY DILATION N/A 05/26/2016   Procedure: AGAPITO DILATION;  Surgeon: Lamar CHRISTELLA Hollingshead, MD;  Location: [facility];  Service: Endoscopy;  Laterality: N/A;   POLYPECTOMY  06/17/2023   Procedure: POLYPECTOMY;  Surgeon: Hollingshead Lamar CHRISTELLA, MD;  Location: [facility];  Service: Endoscopy;;   SCLEROTHERAPY  06/17/2023   Procedure: MATIAS;  Surgeon: Hollingshead Lamar CHRISTELLA, MD;  Location: [facility];  Service: Endoscopy;;   TEE WITHOUT CARDIOVERSION N/A 02/04/2023   Procedure: TRANSESOPHAGEAL ECHOCARDIOGRAM (TEE);  Surgeon: Mallipeddi, Vishnu P, MD;  Location: [facility];  Service: Cardiovascular;  Laterality: N/A;   TOOTH EXTRACTION N/A 01/15/2022   Procedure: DENTAL RESTORATION/EXTRACTIONS;  Surgeon: Sheryle Hamilton, DMD;  Location: [facility];  Service: Oral Surgery;  Laterality: N/A;   VITRECTOMY Left 10/03/2019   Dr. Elner, Vitrectomy, Focal Laser, Removal of Silicone Oil    Current Outpatient Medications  Medication Sig Dispense Refill   acetaminophen  (  TYLENOL ) 500 MG tablet Take 1,000 mg by mouth every 8 (eight) hours as needed for mild pain or moderate pain.     albuterol  (PROVENTIL ) (2.5 MG/3ML) 0.083% nebulizer solution Take 2.5 mg by nebulization every 6 (six) hours as needed for shortness of breath or wheezing.     albuterol  (VENTOLIN  HFA) 108 (90 Base) MCG/ACT inhaler Inhale 2 puffs into the lungs every 4 (four) hours as needed.     amiodarone  (PACERONE ) 200 MG tablet Take 1 Tablet ( 200 mg ) Daily and none on Sunday 90 tablet 1   amLODipine  (NORVASC ) 5 MG tablet Take 5 mg by mouth daily.     apixaban  (ELIQUIS ) 2.5 MG TABS tablet Take 1 tablet (2.5 mg total) by mouth 2 (two)  times daily. 60 tablet 5   aspirin  EC 81 MG tablet Take 1 tablet (81 mg total) by mouth daily. Swallow whole.     clobetasol (TEMOVATE) 0.05 % external solution Apply 1 Application topically 2 (two) times daily as needed.     erythromycin ophthalmic ointment Place 1 Application into the right eye once.     ferrous sulfate  324 MG TBEC Take 324 mg by mouth.     levothyroxine  (SYNTHROID ) 75 MCG tablet Take 75 mcg by mouth daily.     linaclotide  (LINZESS ) 290 MCG CAPS capsule Take 1 capsule (290 mcg total) by mouth daily before breakfast. 30 capsule 1   nitroGLYCERIN  (NITROSTAT ) 0.4 MG SL tablet Place 1 tablet (0.4 mg total) under the tongue every 5 (five) minutes as needed for chest pain. 90 tablet 2   ofloxacin  (OCUFLOX ) 0.3 % ophthalmic solution PLACE ONE DROP IN THE RIGHT EYE FOUR TIMES DAILY FOR FIVE DAYS     pantoprazole  (PROTONIX ) 40 MG tablet Take 1 tablet (40 mg total) by mouth 2 (two) times daily. 60 tablet 0   sertraline  (ZOLOFT ) 25 MG tablet Take 25 mg by mouth daily.     timolol (TIMOPTIC) 0.5 % ophthalmic solution SMARTSIG:In Eye(s)     traZODone  (DESYREL ) 50 MG tablet Take 50 mg by mouth at bedtime.     TRELEGY ELLIPTA 200-62.5-25 MCG/ACT AEPB Inhale 1 puff into the lungs daily.     rosuvastatin  (CRESTOR ) 20 MG tablet Take 20 mg by mouth daily. (Patient not taking: Reported on 12/29/2023)     No current facility-administered medications for this visit.    Allergies as of 12/29/2023 - Review Complete 12/29/2023  Allergen Reaction Noted   Povidone-iodine Other (See Comments) 12/20/2022   Sulfa antibiotics Other (See Comments) 08/02/2013   Sulfamethoxazole Other (See Comments) 03/23/2017    Family History  Problem Relation Age of Onset   Heart disease Mother    COPD Father    Cancer Sister        unknown primary   Cancer Brother        unknown primary   Cancer Brother    Hypertension Son    Colon cancer Neg Hx     Social History   Socioeconomic History   Marital  status: Divorced    Spouse name: Not on file   Number of children: 1   Years of education: Not on file   Highest education level: Not on file  Occupational History   Occupation: Retired  Tobacco Use   Smoking status: Former    Current packs/day: 0.00    Types: Cigarettes    Quit date: 12/28/2015    Years since quitting: 8.0    Passive exposure: Current   Smokeless  tobacco: Never   Tobacco comments:    Patient quit within the last 10 years  Vaping Use   Vaping status: Never Used  Substance and Sexual Activity   Alcohol use: Not Currently   Drug use: Never   Sexual activity: Not Currently  Other Topics Concern   Not on file  Social History Narrative   ** Merged History Encounter **       Social Drivers of Health   Financial Resource Strain: Low Risk  (11/26/2022)   Overall Financial Resource Strain (CARDIA)    Difficulty of Paying Living Expenses: Not hard at all  Food Insecurity: No Food Insecurity (11/07/2023)   Hunger Vital Sign    Worried About Running Out of Food in the Last Year: Never true    Ran Out of Food in the Last Year: Never true  Transportation Needs: No Transportation Needs (11/07/2023)   PRAPARE - Administrator, Civil Service (Medical): No    Lack of Transportation (Non-Medical): No  Physical Activity: Not on file  Stress: No Stress Concern Present (11/26/2022)   [employer] of Occupational Health - Occupational Stress Questionnaire    Feeling of Stress : Not at all  Social Connections: Moderately Integrated (11/07/2023)   Social Connection and Isolation Panel    Frequency of Communication with Friends and Family: More than three times a week    Frequency of Social Gatherings with Friends and Family: More than three times a week    Attends Religious Services: 1 to 4 times per year    Active Member of [employer] or Organizations: Yes    Attends Banker Meetings: 1 to 4 times per year    Marital Status: Divorced  Catering manager  Violence: Not At Risk (11/07/2023)   Humiliation, Afraid, Rape, and Kick questionnaire    Fear of Current or Ex-Partner: No    Emotionally Abused: No    Physically Abused: No    Sexually Abused: No     Review of Systems   Gen: Denies any fever, chills, fatigue, weight loss, lack of appetite.  CV: Denies chest pain, heart palpitations, peripheral edema, syncope.  Resp: Denies shortness of breath at rest or with exertion. Denies wheezing or cough.  GI: Denies dysphagia or odynophagia. Denies jaundice, hematemesis, fecal incontinence. GU : Denies urinary burning, urinary frequency, urinary hesitancy MS: Denies joint pain, muscle weakness, cramps, or limitation of movement.  Derm: Denies rash, itching, dry skin Psych: Denies depression, anxiety, memory loss, and confusion Heme: Denies bruising, bleeding, and enlarged lymph nodes.   Physical Exam   BP 117/63   Pulse 66   Temp 97.8 F (36.6 C)   Ht 5' 8 (1.727 m)   Wt 134 lb 12.8 oz (61.1 kg)   BMI 20.50 kg/m  General:   Alert and oriented. Pleasant and cooperative. Well-nourished and well-developed.  Head:  Normocephalic and atraumatic. Eyes:  Without icterus Abdomen:  +BS, soft, non-tender and non-distended. No HSM noted. No guarding or rebound. No masses appreciated.  Rectal:  no mass. Soft stool in proximal rectal vault, not impacted Msk:  Symmetrical without gross deformities. Normal posture. Extremities:  Without edema. Neurologic:  Alert and  oriented x4;  grossly normal neurologically. Skin:  Intact without significant lesions or rashes. Psych:  Alert and cooperative. Normal mood and affect.   Assessment   Patricia Jackson is a 83 y.o. female presenting today with a history of atrial flutter/A-fib on Eliquis , IDA, CAD, COPD, HTN, breast cancer, CKD,  NSTEMI with DES in 2017, recurrent profound anemia multifactorial requiring blood transfusions in past due to AVMs,due for colonoscopy in September due to poor prep earlier  this year and had multiple polyps with a large polyp removed distal to the IC valve with clip placement.  Now with constipation/obstipation. Physical exam without concern for impaction or obstructive signs/symptoms. Linzess  has lost efficacy.  Will do miralax  purge today, then start Amitiza  24 mcg po BID with food the following day.   Will contact patient via telephone in early October to discuss constipation. If this has resolved, we can arrange colonoscopy.   Return in person in 3 months.     PLAN    Miralax  purge today Start Amitiza  24 mcg po BID tomorrow Phone visit in 2-3 weeks to discuss response. If doing well, can arrange colonoscopy with extended prep 3 month follow-up regardless   Therisa MICAEL Stager, PhD, ANP-BC [facility]

## 2024-01-06 ENCOUNTER — Encounter (HOSPITAL_COMMUNITY): Payer: Self-pay | Admitting: Oncology

## 2024-01-06 ENCOUNTER — Encounter: Payer: Self-pay | Admitting: Internal Medicine

## 2024-01-09 ENCOUNTER — Ambulatory Visit: Payer: Self-pay | Admitting: Cardiology

## 2024-01-09 DIAGNOSIS — N1832 Chronic kidney disease, stage 3b: Secondary | ICD-10-CM | POA: Diagnosis not present

## 2024-01-12 ENCOUNTER — Telehealth: Admitting: Gastroenterology

## 2024-01-17 DIAGNOSIS — D509 Iron deficiency anemia, unspecified: Secondary | ICD-10-CM | POA: Diagnosis not present

## 2024-01-17 DIAGNOSIS — F5101 Primary insomnia: Secondary | ICD-10-CM | POA: Diagnosis not present

## 2024-01-17 DIAGNOSIS — I4891 Unspecified atrial fibrillation: Secondary | ICD-10-CM | POA: Diagnosis not present

## 2024-01-17 DIAGNOSIS — D500 Iron deficiency anemia secondary to blood loss (chronic): Secondary | ICD-10-CM | POA: Diagnosis not present

## 2024-01-17 DIAGNOSIS — E039 Hypothyroidism, unspecified: Secondary | ICD-10-CM | POA: Diagnosis not present

## 2024-01-17 DIAGNOSIS — N184 Chronic kidney disease, stage 4 (severe): Secondary | ICD-10-CM | POA: Diagnosis not present

## 2024-01-17 DIAGNOSIS — I10 Essential (primary) hypertension: Secondary | ICD-10-CM | POA: Diagnosis not present

## 2024-01-17 DIAGNOSIS — E782 Mixed hyperlipidemia: Secondary | ICD-10-CM | POA: Diagnosis not present

## 2024-01-17 DIAGNOSIS — I7300 Raynaud's syndrome without gangrene: Secondary | ICD-10-CM | POA: Diagnosis not present

## 2024-01-17 DIAGNOSIS — K219 Gastro-esophageal reflux disease without esophagitis: Secondary | ICD-10-CM | POA: Diagnosis not present

## 2024-01-17 DIAGNOSIS — E559 Vitamin D deficiency, unspecified: Secondary | ICD-10-CM | POA: Diagnosis not present

## 2024-01-17 DIAGNOSIS — J449 Chronic obstructive pulmonary disease, unspecified: Secondary | ICD-10-CM | POA: Diagnosis not present

## 2024-01-18 ENCOUNTER — Encounter (INDEPENDENT_AMBULATORY_CARE_PROVIDER_SITE_OTHER): Payer: Self-pay | Admitting: Gastroenterology

## 2024-01-26 ENCOUNTER — Emergency Department (HOSPITAL_COMMUNITY)
Admission: RE | Admit: 2024-01-26 | Discharge: 2024-01-26 | Disposition: A | Discharge status: Home / Self Care | Source: Ambulatory Visit

## 2024-01-26 ENCOUNTER — Ambulatory Visit (HOSPITAL_COMMUNITY)
Admission: RE | Admit: 2024-01-26 | Discharge: 2024-01-26 | Disposition: A | Discharge status: Home / Self Care | Source: Ambulatory Visit

## 2024-01-26 ENCOUNTER — Other Ambulatory Visit (HOSPITAL_COMMUNITY): Payer: Self-pay

## 2024-01-26 DIAGNOSIS — R0602 Shortness of breath: Secondary | ICD-10-CM | POA: Diagnosis not present

## 2024-01-26 DIAGNOSIS — Z87891 Personal history of nicotine dependence: Secondary | ICD-10-CM | POA: Diagnosis not present

## 2024-01-26 DIAGNOSIS — R1031 Right lower quadrant pain: Secondary | ICD-10-CM | POA: Insufficient documentation

## 2024-01-26 DIAGNOSIS — R319 Hematuria, unspecified: Secondary | ICD-10-CM | POA: Diagnosis not present

## 2024-01-26 DIAGNOSIS — D649 Anemia, unspecified: Secondary | ICD-10-CM | POA: Diagnosis not present

## 2024-01-26 DIAGNOSIS — Z7901 Long term (current) use of anticoagulants: Secondary | ICD-10-CM | POA: Diagnosis not present

## 2024-01-26 DIAGNOSIS — R935 Abnormal findings on diagnostic imaging of other abdominal regions, including retroperitoneum: Secondary | ICD-10-CM | POA: Diagnosis not present

## 2024-01-26 DIAGNOSIS — J189 Pneumonia, unspecified organism: Secondary | ICD-10-CM | POA: Diagnosis not present

## 2024-01-26 DIAGNOSIS — J449 Chronic obstructive pulmonary disease, unspecified: Secondary | ICD-10-CM | POA: Diagnosis not present

## 2024-01-26 DIAGNOSIS — N12 Tubulo-interstitial nephritis, not specified as acute or chronic: Secondary | ICD-10-CM | POA: Diagnosis not present

## 2024-01-26 DIAGNOSIS — N390 Urinary tract infection, site not specified: Secondary | ICD-10-CM | POA: Diagnosis not present

## 2024-01-27 ENCOUNTER — Other Ambulatory Visit: Payer: Self-pay

## 2024-01-27 ENCOUNTER — Telehealth: Payer: Self-pay | Admitting: Pharmacy Technician

## 2024-01-27 ENCOUNTER — Encounter (HOSPITAL_COMMUNITY): Payer: Self-pay

## 2024-01-27 ENCOUNTER — Emergency Department (HOSPITAL_COMMUNITY)

## 2024-01-27 ENCOUNTER — Inpatient Hospital Stay (HOSPITAL_COMMUNITY)
Admission: EM | Admit: 2024-01-27 | Discharge: 2024-01-31 | DRG: 194 | Disposition: A | Discharge status: Home / Self Care | Source: Ambulatory Visit | Attending: Family Medicine | Admitting: Family Medicine

## 2024-01-27 DIAGNOSIS — E782 Mixed hyperlipidemia: Secondary | ICD-10-CM | POA: Diagnosis not present

## 2024-01-27 DIAGNOSIS — N12 Tubulo-interstitial nephritis, not specified as acute or chronic: Secondary | ICD-10-CM | POA: Diagnosis present

## 2024-01-27 DIAGNOSIS — Z87891 Personal history of nicotine dependence: Secondary | ICD-10-CM

## 2024-01-27 DIAGNOSIS — Z880 Allergy status to penicillin: Secondary | ICD-10-CM

## 2024-01-27 DIAGNOSIS — R1084 Generalized abdominal pain: Secondary | ICD-10-CM | POA: Diagnosis not present

## 2024-01-27 DIAGNOSIS — D6832 Hemorrhagic disorder due to extrinsic circulating anticoagulants: Secondary | ICD-10-CM | POA: Diagnosis present

## 2024-01-27 DIAGNOSIS — Z853 Personal history of malignant neoplasm of breast: Secondary | ICD-10-CM

## 2024-01-27 DIAGNOSIS — E8809 Other disorders of plasma-protein metabolism, not elsewhere classified: Secondary | ICD-10-CM | POA: Diagnosis present

## 2024-01-27 DIAGNOSIS — Z79890 Hormone replacement therapy (postmenopausal): Secondary | ICD-10-CM | POA: Diagnosis not present

## 2024-01-27 DIAGNOSIS — D631 Anemia in chronic kidney disease: Secondary | ICD-10-CM | POA: Diagnosis present

## 2024-01-27 DIAGNOSIS — Z1152 Encounter for screening for COVID-19: Secondary | ICD-10-CM | POA: Diagnosis not present

## 2024-01-27 DIAGNOSIS — I480 Paroxysmal atrial fibrillation: Secondary | ICD-10-CM | POA: Diagnosis present

## 2024-01-27 DIAGNOSIS — E860 Dehydration: Secondary | ICD-10-CM | POA: Diagnosis present

## 2024-01-27 DIAGNOSIS — F32A Depression, unspecified: Secondary | ICD-10-CM | POA: Diagnosis present

## 2024-01-27 DIAGNOSIS — E46 Unspecified protein-calorie malnutrition: Secondary | ICD-10-CM | POA: Diagnosis not present

## 2024-01-27 DIAGNOSIS — Z8249 Family history of ischemic heart disease and other diseases of the circulatory system: Secondary | ICD-10-CM | POA: Diagnosis not present

## 2024-01-27 DIAGNOSIS — I2510 Atherosclerotic heart disease of native coronary artery without angina pectoris: Secondary | ICD-10-CM | POA: Diagnosis present

## 2024-01-27 DIAGNOSIS — E039 Hypothyroidism, unspecified: Secondary | ICD-10-CM | POA: Diagnosis present

## 2024-01-27 DIAGNOSIS — N184 Chronic kidney disease, stage 4 (severe): Secondary | ICD-10-CM | POA: Diagnosis not present

## 2024-01-27 DIAGNOSIS — J440 Chronic obstructive pulmonary disease with acute lower respiratory infection: Secondary | ICD-10-CM | POA: Diagnosis present

## 2024-01-27 DIAGNOSIS — I252 Old myocardial infarction: Secondary | ICD-10-CM

## 2024-01-27 DIAGNOSIS — Z7901 Long term (current) use of anticoagulants: Secondary | ICD-10-CM | POA: Diagnosis not present

## 2024-01-27 DIAGNOSIS — Z9049 Acquired absence of other specified parts of digestive tract: Secondary | ICD-10-CM

## 2024-01-27 DIAGNOSIS — N10 Acute tubulo-interstitial nephritis: Secondary | ICD-10-CM | POA: Diagnosis not present

## 2024-01-27 DIAGNOSIS — Z79899 Other long term (current) drug therapy: Secondary | ICD-10-CM

## 2024-01-27 DIAGNOSIS — I700 Atherosclerosis of aorta: Secondary | ICD-10-CM | POA: Diagnosis not present

## 2024-01-27 DIAGNOSIS — I10 Essential (primary) hypertension: Secondary | ICD-10-CM | POA: Diagnosis not present

## 2024-01-27 DIAGNOSIS — Z825 Family history of asthma and other chronic lower respiratory diseases: Secondary | ICD-10-CM

## 2024-01-27 DIAGNOSIS — R059 Cough, unspecified: Secondary | ICD-10-CM | POA: Diagnosis not present

## 2024-01-27 DIAGNOSIS — E441 Mild protein-calorie malnutrition: Secondary | ICD-10-CM | POA: Diagnosis present

## 2024-01-27 DIAGNOSIS — Z882 Allergy status to sulfonamides status: Secondary | ICD-10-CM

## 2024-01-27 DIAGNOSIS — N179 Acute kidney failure, unspecified: Secondary | ICD-10-CM | POA: Diagnosis present

## 2024-01-27 DIAGNOSIS — J449 Chronic obstructive pulmonary disease, unspecified: Secondary | ICD-10-CM | POA: Diagnosis present

## 2024-01-27 DIAGNOSIS — J189 Pneumonia, unspecified organism: Principal | ICD-10-CM | POA: Diagnosis present

## 2024-01-27 DIAGNOSIS — K921 Melena: Secondary | ICD-10-CM | POA: Diagnosis present

## 2024-01-27 DIAGNOSIS — Z888 Allergy status to other drugs, medicaments and biological substances status: Secondary | ICD-10-CM

## 2024-01-27 DIAGNOSIS — Z681 Body mass index (BMI) 19 or less, adult: Secondary | ICD-10-CM | POA: Diagnosis not present

## 2024-01-27 DIAGNOSIS — Z955 Presence of coronary angioplasty implant and graft: Secondary | ICD-10-CM

## 2024-01-27 DIAGNOSIS — D649 Anemia, unspecified: Secondary | ICD-10-CM | POA: Insufficient documentation

## 2024-01-27 DIAGNOSIS — D696 Thrombocytopenia, unspecified: Secondary | ICD-10-CM | POA: Diagnosis not present

## 2024-01-27 DIAGNOSIS — I129 Hypertensive chronic kidney disease with stage 1 through stage 4 chronic kidney disease, or unspecified chronic kidney disease: Secondary | ICD-10-CM | POA: Diagnosis present

## 2024-01-27 DIAGNOSIS — R918 Other nonspecific abnormal finding of lung field: Secondary | ICD-10-CM | POA: Diagnosis not present

## 2024-01-27 LAB — COMPREHENSIVE METABOLIC PANEL WITH GFR
ALT: 19 U/L (ref 0–44)
AST: 44 U/L — ABNORMAL HIGH (ref 15–41)
Albumin: 3.4 g/dL — ABNORMAL LOW (ref 3.5–5.0)
Alkaline Phosphatase: 80 U/L (ref 38–126)
Anion gap: 14 (ref 5–15)
BUN: 30 mg/dL — ABNORMAL HIGH (ref 8–23)
CO2: 21 mmol/L — ABNORMAL LOW (ref 22–32)
Calcium: 9 mg/dL (ref 8.9–10.3)
Chloride: 101 mmol/L (ref 98–111)
Creatinine, Ser: 1.89 mg/dL — ABNORMAL HIGH (ref 0.44–1.00)
GFR, Estimated: 26 mL/min — ABNORMAL LOW (ref >60)
Glucose, Bld: 100 mg/dL — ABNORMAL HIGH (ref 70–99)
Potassium: 3.7 mmol/L (ref 3.5–5.1)
Sodium: 136 mmol/L (ref 135–145)
Total Bilirubin: 0.7 mg/dL (ref 0.0–1.2)
Total Protein: 6.7 g/dL (ref 6.5–8.1)

## 2024-01-27 LAB — CBC WITH DIFFERENTIAL/PLATELET
Abs Immature Granulocytes: 0.1 K/uL — ABNORMAL HIGH (ref 0.00–0.07)
Basophils Absolute: 0 K/uL (ref 0.0–0.1)
Basophils Relative: 0 %
Eosinophils Absolute: 0 K/uL (ref 0.0–0.5)
Eosinophils Relative: 0 %
HCT: 34.7 % — ABNORMAL LOW (ref 36.0–46.0)
Hemoglobin: 11.1 g/dL — ABNORMAL LOW (ref 12.0–15.0)
Immature Granulocytes: 1 %
Lymphocytes Relative: 5 %
Lymphs Abs: 0.7 K/uL (ref 0.7–4.0)
MCH: 31.1 pg (ref 26.0–34.0)
MCHC: 32 g/dL (ref 30.0–36.0)
MCV: 97.2 fL (ref 80.0–100.0)
Monocytes Absolute: 1 K/uL (ref 0.1–1.0)
Monocytes Relative: 7 %
Neutro Abs: 11.5 K/uL — ABNORMAL HIGH (ref 1.7–7.7)
Neutrophils Relative %: 87 %
Platelets: 111 K/uL — ABNORMAL LOW (ref 150–400)
RBC: 3.57 MIL/uL — ABNORMAL LOW (ref 3.87–5.11)
RDW: 16.5 % — ABNORMAL HIGH (ref 11.5–15.5)
WBC: 13.3 K/uL — ABNORMAL HIGH (ref 4.0–10.5)
nRBC: 0 % (ref 0.0–0.2)

## 2024-01-27 LAB — LACTIC ACID, PLASMA: Lactic Acid, Venous: 2.3 mmol/L (ref 0.5–1.9)

## 2024-01-27 LAB — URINALYSIS, W/ REFLEX TO CULTURE (INFECTION SUSPECTED)
Bilirubin Urine: NEGATIVE
Glucose, UA: NEGATIVE mg/dL
Ketones, ur: NEGATIVE mg/dL
Nitrite: NEGATIVE
Protein, ur: 30 mg/dL — AB
Specific Gravity, Urine: 1.011 (ref 1.005–1.030)
WBC, UA: >50 WBC/hpf (ref 0–5)
pH: 6 (ref 5.0–8.0)

## 2024-01-27 LAB — RESP PANEL BY RT-PCR (RSV, FLU A&B, COVID)  RVPGX2
Influenza A by PCR: NEGATIVE
Influenza B by PCR: NEGATIVE
Resp Syncytial Virus by PCR: NEGATIVE
SARS Coronavirus 2 by RT PCR: NEGATIVE

## 2024-01-27 LAB — PROTIME-INR
INR: 1.5 — ABNORMAL HIGH (ref 0.8–1.2)
Prothrombin Time: 18.5 s — ABNORMAL HIGH (ref 11.4–15.2)

## 2024-01-27 LAB — TYPE AND SCREEN
ABO/RH(D): A POS
Antibody Screen: NEGATIVE

## 2024-01-27 MED ORDER — SODIUM CHLORIDE 0.9 % IV SOLN
500.0000 mg | INTRAVENOUS | Status: DC
  Administered 2024-01-27 – 2024-01-28 (×2): 500 mg via INTRAVENOUS
  Filled 2024-01-27 (×2): qty 5

## 2024-01-27 MED ORDER — ONDANSETRON HCL 4 MG/2ML IJ SOLN
4.0000 mg | Freq: Once | INTRAMUSCULAR | Status: AC
  Administered 2024-01-27: 4 mg via INTRAVENOUS
  Filled 2024-01-27: qty 2

## 2024-01-27 MED ORDER — SODIUM CHLORIDE 0.9 % IV SOLN
1.0000 g | Freq: Once | INTRAVENOUS | Status: AC
  Administered 2024-01-27: 1 g via INTRAVENOUS
  Filled 2024-01-27: qty 10

## 2024-01-27 MED ORDER — MORPHINE SULFATE (PF) 4 MG/ML IV SOLN
2.0000 mg | Freq: Once | INTRAVENOUS | Status: AC
  Administered 2024-01-27: 2 mg via INTRAVENOUS
  Filled 2024-01-27: qty 1

## 2024-01-27 MED ORDER — LACTATED RINGERS IV BOLUS
500.0000 mL | Freq: Once | INTRAVENOUS | Status: AC
  Administered 2024-01-27: 500 mL via INTRAVENOUS

## 2024-01-27 NOTE — ED Triage Notes (Signed)
 Pt to er room number one, pt states that her md called her and told her that her heme was 4.1, states that this will be her 4th or 5th transfusion for lower h/h.  Pt states that she is having black and tarry bm.

## 2024-01-27 NOTE — Telephone Encounter (Signed)
 Auth Submission: NO AUTH NEEDED Site of care: Site of care: [facility] Payer: [employer] DUAL Medication & CPT/J Code(s) submitted: Feraheme  (ferumoxytol ) [id number] Diagnosis Code: D50.9 Route of submission (phone, fax, portal):  Phone # Fax # Auth type: Buy/Bill PB Units/visits requested: 2 DOSES Reference number: [id number] Approval from: 01/27/24 to 04/04/24

## 2024-01-27 NOTE — Telephone Encounter (Signed)
 Auth Submission: NO AUTH NEEDED Site of care: Site of care: [facility] Payer: [employer] Medicare Medication & CPT/J Code(s) submitted: Feraheme  (ferumoxytol ) [id number] Diagnosis Code: D50.9 Route of submission (phone, fax, portal):  Phone # Fax # Auth type: Buy/Bill PB Units/visits requested: 510mg  x 2 doses Reference number:  Approval from: 01/27/24 to 04/04/24

## 2024-01-27 NOTE — Sepsis Progress Note (Addendum)
 Notified bedside nurse of need to draw blood cultures. Via secure chat, confirming if Blood Culture draw times.   [id number]--Beside RN confirmed via Fish farm manager. Blood cultures were drawn prior to the second dose of antibiotics being given.

## 2024-01-27 NOTE — ED Provider Notes (Signed)
 [place] [facility] AT [facility] Provider Note   CSN: [id number] Arrival date & time: 01/27/24  1652     History  Chief Complaint  Patient presents with   Melena    Patricia Jackson is a 83 y.o. 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   Patient presents accompanied by her son with report of Hgb 4.1 from her primary care doctor. She is having dark black BMs for months. Has h/o Afib/flutter on Eliquis , last dose was this AM. Endorses feeling very generally weak. Was recently diagnosed with UTI and started taking nitrofurantoin today.  She does note some pain in the right upper back that goes down into her suprapubic region of her abdomen.  Son at bedside states this is the 4th or 5th time she will have needed a transfusion for low H&H. Had some lower abdominal pain and right sided abdominal pain, had a US  renal here yesterday which showed no hydronephrosis or nephrolithiasis but possible cystitis. Last blood transfusion was June 10.  She also reports a nonproductive cough over the last several days and had coughing this morning that caused her to vomit.  Denies any fevers chills shortness of breath or chest pain.  Last admission in August 2025 for back pain/elevated troponin.    Past Medical History:  Diagnosis Date   A-fib [facility])    Atrial flutter (HCC)    On Eliquis  in 8/17 but discontinued after stent placement   Breast cancer (HCC)    remote   CAD in native artery 03/16/2016   COPD (chronic obstructive pulmonary disease) (HCC)    Dysrhythmia     Hypertension    Iron  deficiency anemia 06/12/2021   NSTEMI (non-ST elevated myocardial infarction) (HCC)    2017   S/P angioplasty with stent 03/15/16 DES Resolute, pLCX 03/16/2016   Vitreous hemorrhage of left eye (HCC) 08/01/2019       Home Medications Prior to Admission medications   Medication Sig Start Date End Date Taking? Authorizing Provider  acetaminophen  (TYLENOL ) 500 MG tablet Take 1,000 mg by mouth every 8 (eight) hours as needed for mild pain or moderate pain. 03/21/17  Yes [provider]  albuterol  (PROVENTIL ) (2.5 MG/3ML) 0.083% nebulizer solution Take 2.5 mg by nebulization every 6 (six) hours as needed for shortness of breath or wheezing.   Yes [provider]  albuterol  (VENTOLIN  HFA) 108 (90 Base) MCG/ACT inhaler Inhale 2 puffs into the lungs every 4 (four) hours as needed for wheezing or shortness of breath. 10/14/23  Yes [provider]  amiodarone  (PACERONE ) 200 MG tablet Take 1 Tablet ( 200 mg ) Daily and none on Sunday Patient taking differently: Take 200 mg by mouth daily. Take 1 Tablet ( 200 mg ) Daily and none on Sunday 10/25/23  Yes Waddell Danelle ORN, MD  amLODipine  (NORVASC ) 5 MG tablet Take 5 mg by mouth daily. 08/15/23  Yes [provider]  apixaban  (ELIQUIS ) 2.5 MG TABS tablet Take 1 tablet (2.5 mg total) by mouth 2 (two) times daily. 10/25/23  Yes Alvan Dorn FALCON, MD  ferrous sulfate  324 MG TBEC Take 324 mg  by mouth daily with breakfast.   Yes [provider]  levothyroxine  (SYNTHROID ) 75 MCG tablet Take 75 mcg by mouth daily before breakfast. 10/20/23  Yes [provider]  losartan (COZAAR) 25 MG tablet Take 25 mg by mouth at bedtime.   Yes [provider]  lubiprostone  (AMITIZA ) 24 MCG capsule Take 1 capsule (24 mcg total) by mouth 2 (two) times daily with a meal. 12/29/23  Yes Shirlean Therisa ORN, NP  nitrofurantoin, macrocrystal-monohydrate, (MACROBID) 100 MG capsule Take 100 mg by mouth 2 (two) times  daily. 01/26/24  Yes [provider]  nitroGLYCERIN  (NITROSTAT ) 0.4 MG SL tablet Place 1 tablet (0.4 mg total) under the tongue every 5 (five) minutes as needed for chest pain. 10/14/23  Yes BranchDorn FALCON, MD  pantoprazole  (PROTONIX ) 40 MG tablet Take 1 tablet (40 mg total) by mouth 2 (two) times daily. 09/12/23 01/27/24 Yes Shah, Pratik D, DO  prednisoLONE  acetate (PRED FORTE ) 1 % ophthalmic suspension Place 1 drop into the right eye See admin instructions. Place 1 drop in right eye 4 times daily for 1 week, 3 times daily for 1 week, 2 times daily for 1 week, then 1 time daily for 1 week, then stop.   Yes [provider]  rosuvastatin  (CRESTOR ) 20 MG tablet Take 20 mg by mouth daily. 02/26/17  Yes [provider]  timolol (TIMOPTIC) 0.5 % ophthalmic solution Place 1 drop into the right eye 2 (two) times daily.   Yes [provider]  traZODone  (DESYREL ) 50 MG tablet Take 50 mg by mouth at bedtime. 08/31/23  Yes [provider]  DOMINIC BECK 200-62.5-25 MCG/ACT AEPB Inhale 1 puff into the lungs daily. 11/02/22  Yes [provider]      Allergies    Amoxicillin -pot clavulanate, Povidone-iodine, Sulfa antibiotics, and Sulfamethoxazole    Review of Systems   Review of Systems A 10 point review of systems was performed and is negative unless otherwise reported in HPI.  Physical Exam Updated Vital Signs BP (!) 160/141   Pulse 87   Temp 98.1 F (36.7 C) (Oral)   Resp 15   Ht 5' 8 (1.727 m)   Wt 59 kg   SpO2 96%   BMI 19.77 kg/m  Physical Exam General: Normal appearing elderly female, lying in bed.  HEENT: PERRLA, Sclera anicteric, MMM, trachea midline.  Cardiology: RRR, no murmurs/rubs/gallops. Resp: Coughing intermittently. Normal respiratory rate and effort. CTAB, no wheezes, rhonchi, crackles.  Abd: Soft, +mild suprapubic TTP, non-distended. No rebound tenderness or guarding.  GU: Deferred. MSK: No peripheral edema or signs of  trauma. Extremities without deformity or TTP. No cyanosis or clubbing. Skin: warm, dry.  Back: +R CVA TTP Neuro: A&Ox4, CNs II-XII grossly intact. MAEs. Sensation grossly intact.  Psych: Normal mood and affect.   ED Results / Procedures / Treatments   Labs (all labs ordered are listed, but only abnormal results are displayed) Labs Reviewed  CBC WITH DIFFERENTIAL/PLATELET - Abnormal; Notable for the following components:      Result Value   WBC 13.3 (*)    RBC 3.57 (*)    Hemoglobin 11.1 (*)    HCT 34.7 (*)    RDW 16.5 (*)    Platelets 111 (*)    Neutro Abs 11.5 (*)    Abs Immature Granulocytes 0.10 (*)    All other components within normal limits  COMPREHENSIVE METABOLIC PANEL WITH GFR - Abnormal; Notable for the following components:   CO2 21 (*)  Glucose, Bld 100 (*)    BUN 30 (*)    Creatinine, Ser 1.89 (*)    Albumin  3.4 (*)    AST 44 (*)    GFR, Estimated 26 (*)    All other components within normal limits  URINALYSIS, W/ REFLEX TO CULTURE (INFECTION SUSPECTED) - Abnormal; Notable for the following components:   APPearance HAZY (*)    Hgb urine dipstick SMALL (*)    Protein, ur 30 (*)    Leukocytes,Ua SMALL (*)    Bacteria, UA FEW (*)    All other components within normal limits  PROTIME-INR - Abnormal; Notable for the following components:   Prothrombin Time 18.5 (*)    INR 1.5 (*)    All other components within normal limits  URINE CULTURE  RESP PANEL BY RT-PCR (RSV, FLU A&B, COVID)  RVPGX2  CULTURE, BLOOD (ROUTINE X 2)  CULTURE, BLOOD (ROUTINE X 2)  TYPE AND SCREEN    EKG None  Radiology DG Chest Portable 1 View Result Date: 01/27/2024 EXAM: 1 VIEW XRAY OF THE CHEST 01/27/2024 10:05:33 PM COMPARISON: None available. CLINICAL HISTORY: Cough. Pt to er room number one, pt states that her md called her and told her that her heme was 4.1, states that this will be her 4th or 5th transfusion for lower h/h. Pt states that she is having black and tarry bm.  FINDINGS: LUNGS AND PLEURA: The lungs are symmetrically hyperinflated in keeping with changes of underlying COPD. Chronic interstitial thickening. New spiculated nodular opacity within the right upper lung zone, possibly infectious or inflammatory and acute setting. No pleural effusion. HEART AND MEDIASTINUM: Atherosclerotic calcification within the thoracic aorta. Cardiac size within normal limits. BONES AND SOFT TISSUES: Surgical clips within the left axilla. No acute bony abnormality. IMPRESSION: 1. New spiculated nodular opacity within the right upper lung zone, possibly infectious or inflammatory in the acute setting. Follow-up chest radiograph is recommended in 3-4 weeks, following conservative therapy, to document resolution. If persistent at that time, dedicated contrast-enhanced CT imaging of the chest is recommended for further evaluation. 2. COPD Electronically signed by: Dorethia Molt MD 01/27/2024 10:10 PM EDT RP Workstation: [id number]   CT ABDOMEN PELVIS WO CONTRAST Result Date: 01/27/2024 EXAM: CT ABDOMEN AND PELVIS WITHOUT CONTRAST 01/27/2024 08:34:47 PM TECHNIQUE: CT of the abdomen and pelvis was performed without the administration of intravenous contrast. Multiplanar reformatted images are provided for review. Automated exposure control, iterative reconstruction, and/or weight-based adjustment of the mA/kV was utilized to reduce the radiation dose to as low as reasonably achievable. COMPARISON: 08/23/2021 CLINICAL HISTORY: Abdominal pain, acute, nonlocalized; gen weakness, abd pain, AKI. General abd pain, weakness, DKI.; Pt to er room number one, pt states that her md called her and told her that her heme was 4.1, states that this will be her 4th or 5th transfusion for lower h/h. Pt states that she is having black and tarry bm. FINDINGS: LIMITATIONS/ARTIFACTS: Severely motion-degraded images, limiting evaluation. LOWER CHEST: No acute abnormality. LIVER: The liver is unremarkable. GALLBLADDER  AND BILE DUCTS: Gallbladder is unremarkable. No biliary ductal dilatation. SPLEEN: No acute abnormality. PANCREAS: No acute abnormality. ADRENAL GLANDS: No acute abnormality. KIDNEYS, URETERS AND BLADDER: 1.8 cm left renal cyst (image 19), benign (Bosniak I), no follow up recommended. Stable fullness of the right renal pelvis, favoring extrarenal pelvis over hydronephrosis. Mild right perinephric stranding (image 36), new, suggesting the possibility of pyelonephritis. No stones in the kidneys or ureters. Urinary bladder is unremarkable. GI AND BOWEL: Stomach demonstrates no  acute abnormality. Multiple diverticula mainly in the left hemicolon, without signs of diverticulitis. There is no bowel obstruction. PERITONEUM AND RETROPERITONEUM: No ascites. No free air. VASCULATURE: Aorta is normal in caliber. Atherosclerotic calcifications of the abdominal aorta and branch vessels. LYMPH NODES: No lymphadenopathy. REPRODUCTIVE ORGANS: No acute abnormality. BONES AND SOFT TISSUES: No acute osseous abnormality. No focal soft tissue abnormality. IMPRESSION: 1. Severely motion-degraded exam, limiting evaluation. 2. Mild right perinephric stranding, new, suspicious for acute pyelonephritis. 3. Otherwise negative. Electronically signed by: Pinkie Pebbles MD 01/27/2024 08:40 PM EDT RP Workstation: [id number]   DG Abd 1 View Result Date: 01/26/2024 CLINICAL DATA:  pain, right lower quadrant abdominal pain EXAM: ABDOMEN - 1 VIEW COMPARISON:  September 08, 2023 FINDINGS: Nonobstructive bowel gas pattern.No pneumoperitoneum. No organomegaly or radiopaque calculi. No acute fracture or destructive lesion. Multilevel thoracic osteophytosis. IMPRESSION: Nonobstructive bowel gas pattern.  No radiopaque calculi visualized. Electronically Signed   By: Rogelia Myers M.D.   On: 01/26/2024 12:56   US  RENAL Result Date: 01/26/2024 CLINICAL DATA:  RLQ ab pain/+3 blood in urine EXAM: RENAL / URINARY TRACT ULTRASOUND COMPLETE COMPARISON:   08/23/2021, 01/25/2023 FINDINGS: Right Kidney: Renal measurements: 9.3 x 4.1 x 3.8 cm = volume: 74 mL.Normal echogenicity. Interpolar region cyst measuring 1.1 x 1 x 1 cm. No hydronephrosis or nephrolithiasis. Left Kidney: Renal measurements: 9.7 x 4.4 x 3.9 cm = volume: 88 mL. Normal echogenicity. Upper pole cyst measuring 2 x 2.1 x 1.8 cm. A smaller upper pole cyst is also present measuring 1.1 x 1.2 x 1.2 cm. No hydronephrosis or nephrolithiasis. Bladder: Circumferential wall thickening of the urinary bladder. Other: None. IMPRESSION: 1. No hydronephrosis or nephrolithiasis. 2. Circumferential wall thickening of the urinary bladder, which may be due to underdistension. If there is concern for acute cystitis, correlation with urinalysis would be recommended. Electronically Signed   By: Rogelia Myers M.D.   On: 01/26/2024 12:45    Procedures .Critical Care  Performed by: Franklyn Sid SAILOR, MD Authorized by: Franklyn Sid SAILOR, MD   Critical care provider statement:    Critical care time (minutes):  45   Critical care was necessary to treat or prevent imminent or life-threatening deterioration of the following conditions:  Sepsis   Critical care was time spent personally by me on the following activities:  Development of treatment plan with patient or surrogate, discussions with consultants, evaluation of patient's response to treatment, examination of patient, ordering and review of laboratory studies, ordering and review of radiographic studies, ordering and performing treatments and interventions, pulse oximetry, re-evaluation of patient's condition, review of old charts and obtaining history from patient or surrogate   Care discussed with: admitting provider       Medications Ordered in ED Medications  azithromycin  (ZITHROMAX ) 500 mg in sodium chloride  0.9 % 250 mL IVPB (has no administration in time range)  lactated ringers  bolus 500 mL (0 mLs Intravenous Stopped 01/27/24 2037)  cefTRIAXone   (ROCEPHIN ) 1 g in sodium chloride  0.9 % 100 mL IVPB (0 g Intravenous Stopped 01/27/24 2206)  morphine  (PF) 4 MG/ML injection 2 mg (2 mg Intravenous Given 01/27/24 2129)  morphine  (PF) 4 MG/ML injection 2 mg (2 mg Intravenous Given 01/27/24 2205)  lactated ringers  bolus 500 mL (500 mLs Intravenous New Bag/Given 01/27/24 2213)  ondansetron  (ZOFRAN ) injection 4 mg (4 mg Intravenous Given 01/27/24 2202)    ED Course/ Medical Decision Making/ A&P  Medical Decision Making Amount and/or Complexity of Data Reviewed Labs: ordered. Decision-making details documented in ED Course. Radiology: ordered. Decision-making details documented in ED Course.  Risk Prescription drug management. Decision regarding hospitalization.    This patient presents to the ED for concern of concern for abnormal lab, right flank and abdominal pain, cough, this involves an extensive number of treatment options, and is a complaint that carries with it a high risk of complications and morbidity.  I considered the following differential and admission for this acute, potentially life threatening condition.  Patient is overall nontoxic-appearing.  Hemodynamically stable on arrival.  MDM:    Patient was sent in for a low hemoglobin which on recheck is 11.1.  This is very reassuring and patient does not need any blood transfusion.  Her patient does have right flank pain and suprapubic pain, was recently diagnosed with an UTI she reports and was given nitrofurantoin.  Labs also demonstrate a leukocytosis as well as an AKI with creatinine 1.89.  CT does not demonstrate any urolithiasis but does demonstrate perinephric stranding concerning for pyelonephritis and UA is positive for UTI.  Will give her IV ceftriaxone  for pyelonephritis and fluids for AKI, which could be prerenal in the setting of decreased p.o. intake and nausea.  Patient does have a leukocytosis but is afebrile.  She does develop tachypnea and a  cough, chest x-ray does show concern for right upper lobe pneumonia.  She is also given IV azithromycin .  Blood cultures and urine cultures are ordered due to concern for possible sepsis.  IV Zofran  for nausea vomiting.  Discussed with patient and her son at bedside who are in agreement with the plan for admission to the hospital.  Clinical Course as of 01/27/24 2239  Fri Jan 27, 2024  1806 Hemoglobin(!): 11.1 [HN]  1807 WBC(!): 13.3 +leukocytosios w/ left shift [HN]  1807 INR(!): 1.5 [HN]  2116 CT ABDOMEN PELVIS WO CONTRAST 1. Severely motion-degraded exam, limiting evaluation. 2. Mild right perinephric stranding, new, suspicious for acute pyelonephritis. 3. Otherwise negative.   [HN]  2132 Urinalysis, w/ Reflex to Culture (Infection Suspected) -Urine, Clean Catch(!) +UTI, giving IV ceftriaxone  - CT shows pyelonephritis and patient does have R flank pain. [HN]  2156 On reevalulation pt c/o R flank pain radiating into abdomen, will give IV morphine . She is also coughing and has mild tachypnea which she states she had a CXR yesterday at OSH which was normal. She is also c/o nausea and will give IV zofran . Will admit to hospitalist. [HN]  2230 Azithromycin  ordered for possible RUL PNA seen on CXR. [HN]  2231 Admitted to Dr. Manfred hospitalist. [HN]    Clinical Course User Index [HN] Franklyn Sid SAILOR, MD    Labs: I Ordered, and personally interpreted labs.  The pertinent results include:  those listed above  Imaging Studies ordered: I ordered imaging studies including chest x-ray, CT abdomen pelvis without contrast I independently visualized and interpreted imaging. I agree with the radiologist interpretation  Additional history obtained from chart review.    Cardiac Monitoring: The patient was maintained on a cardiac monitor.  I personally viewed and interpreted the cardiac monitored which showed an underlying rhythm of: Normal sinus rhythm  Reevaluation: After the interventions  noted above, I reevaluated the patient and found that they have :worsened  Social Determinants of Health: Lives independently  Disposition: Admit to hospitalist  Co morbidities that complicate the patient evaluation  Past Medical History:  Diagnosis Date   A-fib (HCC)  Atrial flutter (HCC)    On Eliquis  in 8/17 but discontinued after stent placement   Breast cancer (HCC)    remote   CAD in native artery 03/16/2016   COPD (chronic obstructive pulmonary disease) (HCC)    Dysrhythmia    Hypertension    Iron  deficiency anemia 06/12/2021   NSTEMI (non-ST elevated myocardial infarction) (HCC)    2017   S/P angioplasty with stent 03/15/16 DES Resolute, pLCX 03/16/2016   Vitreous hemorrhage of left eye (HCC) 08/01/2019     Medicines Meds ordered this encounter  Medications   lactated ringers  bolus 500 mL   cefTRIAXone  (ROCEPHIN ) 1 g in sodium chloride  0.9 % 100 mL IVPB    Antibiotic Indication::   UTI   morphine  (PF) 4 MG/ML injection 2 mg    Refill:  0   morphine  (PF) 4 MG/ML injection 2 mg    Refill:  0   lactated ringers  bolus 500 mL   ondansetron  (ZOFRAN ) injection 4 mg   azithromycin  (ZITHROMAX ) 500 mg in sodium chloride  0.9 % 250 mL IVPB    Antibiotic Indication::   CAP    I have reviewed the patients home medicines and have made adjustments as needed  Problem List / ED Course: Problem List Items Addressed This Visit       Respiratory   sepsis from pneumonia   Relevant Medications   nitrofurantoin, macrocrystal-monohydrate, (MACROBID) 100 MG capsule   azithromycin  (ZITHROMAX ) 500 mg in sodium chloride  0.9 % 250 mL IVPB (Start on 01/27/2024 10:45 PM)   Other Visit Diagnoses       Pyelonephritis    -  Primary   Relevant Medications   nitrofurantoin, macrocrystal-monohydrate, (MACROBID) 100 MG capsule   cefTRIAXone  (ROCEPHIN ) 1 g in sodium chloride  0.9 % 100 mL IVPB (Completed)   azithromycin  (ZITHROMAX ) 500 mg in sodium chloride  0.9 % 250 mL IVPB (Start on  01/27/2024 10:45 PM)                   This note was created using dictation software, which may contain spelling or grammatical errors.    Franklyn Sid SAILOR, MD 01/27/24 [id number]

## 2024-01-27 NOTE — H&P (Signed)
 History and Physical    Patient: Patricia Jackson FMW:[id number] DOB: 09-26-1940 DOA: 01/27/2024 DOS: the patient was seen and examined on 01/28/2024 PCP: Shona Norleen PEDLAR, MD  Patient coming from: Home  Chief Complaint:  Chief Complaint  Patient presents with   Melena   HPI: Patricia Jackson is a 83 y.o. female with medical history significant of hypertension, hyperlipidemia, hypothyroidism, atrial fibrillation on chronic anticoagulation, COPD,  chronic kidney disease, anemia of chronic kidney disease and chronic GI bleed who presents to the emergency department with report of hemoglobin of 4.1 from her PCP.  She complained of about 10-day onset of right flank pain with extension of the pain into the right groin, she also complained of cough which started about the same time and has been worsening in the last few days.  Cough was productive, but was unable to tell me the color of the phlegm/sputum, cough this morning resulted in posttussive vomiting x 1.  She complained of several months of black bowel movements  In the emergency department, she was hemodynamically stable.  Workup in the ED showed WBC 13.3, hemoglobin 11.1, hematocrit 34.7, MCV 97.2, platelets 111.  BMP shows sodium 136, potassium 3.7, chloride 101, bicarb 21, blood glucose 100, BUN 30, creatinine 1.89 (baseline creatinine 1.2-1.6), lactic acid 2.3, urinalysis was suggestive of UTI.  Influenza A, B, SARS-CoV-2, RSV was negative.  Blood culture pending. Chest x-ray showed new  spiculated nodular opacity within the right upper lung zone, possibly infectious or inflammatory in the acute setting. CT abdomen and pelvis without contrast was suggestive of acute pyelonephritis She was treated with IV ceftriaxone  and azithromycin , IV hydration was provided, morphine  was given due to pain and Zofran  was given.    Review of Systems: As mentioned in the history of present illness. All other systems reviewed and are negative. Past Medical  History:  Diagnosis Date   A-fib [facility])    Atrial flutter (HCC)    On Eliquis  in 8/17 but discontinued after stent placement   Breast cancer (HCC)    remote   CAD in native artery 03/16/2016   COPD (chronic obstructive pulmonary disease) (HCC)    Dysrhythmia    Hypertension    Iron  deficiency anemia 06/12/2021   NSTEMI (non-ST elevated myocardial infarction) (HCC)    2017   S/P angioplasty with stent 03/15/16 DES Resolute, pLCX 03/16/2016   Vitreous hemorrhage of left eye (HCC) 08/01/2019   Past Surgical History:  Procedure Laterality Date   APPENDECTOMY     BREAST SURGERY Left    CARDIAC CATHETERIZATION N/A 03/15/2016   Procedure: Left Heart Cath and Coronary Angiography;  Surgeon: Victory LELON Sharps, MD;  Location: [facility] INVASIVE CV LAB;  Service: Cardiovascular;  Laterality: N/A;   CARDIAC CATHETERIZATION N/A 03/15/2016   Procedure: Coronary Stent Intervention;  Surgeon: Victory LELON Sharps, MD;  Location: [facility] INVASIVE CV LAB;  Service: Cardiovascular;  Laterality: N/A;   CARDIOVERSION N/A 02/04/2023   Procedure: CARDIOVERSION;  Surgeon: Stacia Diannah SQUIBB, MD;  Location: [facility] ORS;  Service: Cardiovascular;  Laterality: N/A;   COLONOSCOPY     remote   COLONOSCOPY N/A 05/26/2016   Procedure: COLONOSCOPY;  Surgeon: Lamar CHRISTELLA Hollingshead, MD;  Location: [facility] END[facility];  Service: Endoscopy;  Laterality: N/A;  845   COLONOSCOPY N/A 06/17/2023   Procedure: COLONOSCOPY;  Surgeon: Hollingshead Lamar CHRISTELLA, MD;  Location: [facility];  Service: Endoscopy;  Laterality: N/A;   ENTEROSCOPY N/A 09/06/2023   Procedure: ENTEROSCOPY;  Surgeon: Eartha Angelia Sieving, MD;  Location: [facility];  Service: Gastroenterology;  Laterality: N/A;   ESOPHAGOGASTRODUODENOSCOPY N/A 05/26/2016   Procedure: ESOPHAGOGASTRODUODENOSCOPY (EGD);  Surgeon: Lamar CHRISTELLA Hollingshead, MD;  [facility];  Service: Endoscopy;  Laterality: N/A;   ESOPHAGOGASTRODUODENOSCOPY N/A 06/16/2023   Procedure: EGD (ESOPHAGOGASTRODUODENOSCOPY);  Surgeon:  Cinderella Deatrice FALCON, MD;  [facility];  Service: Endoscopy;  Laterality: N/A;   ESOPHAGOGASTRODUODENOSCOPY N/A 09/06/2023   Procedure: EGD (ESOPHAGOGASTRODUODENOSCOPY);  Surgeon: Eartha Flavors, Toribio, MD;  [facility];  Service: Gastroenterology;  Laterality: N/A;  with possible dilation   ESOPHAGOGASTRODUODENOSCOPY (EGD) WITH PROPOFOL  N/A 11/22/2022   Procedure: ESOPHAGOGASTRODUODENOSCOPY (EGD) WITH PROPOFOL ;  Surgeon: Cindie Carlin POUR, DO;  [facility];  Service: Endoscopy;  Laterality: N/A;   EYE SURGERY     EYE SURGERY Left    Dr. Loretha   HEMOSTASIS CLIP PLACEMENT  06/17/2023   Procedure: CONTROL OF HEMORRHAGE, GI TRACT, ENDOSCOPIC, BY CLIPPING OR OVERSEWING;  Surgeon: Hollingshead Lamar CHRISTELLA, MD;  [facility];  Service: Endoscopy;;   HOT HEMOSTASIS  11/22/2022   Procedure: HOT HEMOSTASIS (ARGON PLASMA COAGULATION/BICAP);  Surgeon: Cindie Carlin POUR, DO;  [facility];  Service: Endoscopy;;   HOT HEMOSTASIS  06/16/2023   Procedure: EGD, WITH ARGON PLASMA COAGULATION;  Surgeon: Cinderella Deatrice FALCON, MD;  [facility];  Service: Endoscopy;;   MALONEY DILATION N/A 05/26/2016   Procedure: AGAPITO DILATION;  Surgeon: Lamar CHRISTELLA Hollingshead, MD;  [facility];  Service: Endoscopy;  Laterality: N/A;   POLYPECTOMY  06/17/2023   Procedure: POLYPECTOMY;  Surgeon: Hollingshead Lamar CHRISTELLA, MD;  [facility];  Service: Endoscopy;;   SCLEROTHERAPY  06/17/2023   Procedure: MATIAS;  Surgeon: Hollingshead Lamar CHRISTELLA, MD;  [facility];  Service: Endoscopy;;   TEE WITHOUT CARDIOVERSION N/A 02/04/2023   Procedure: TRANSESOPHAGEAL ECHOCARDIOGRAM (TEE);  Surgeon: Mallipeddi, Vishnu P, MD;  Location: [facility];  Service: Cardiovascular;  Laterality: N/A;   TOOTH EXTRACTION N/A 01/15/2022   Procedure: DENTAL RESTORATION/EXTRACTIONS;  Surgeon: Sheryle Hamilton, DMD;  Location: [facility];  Service: Oral Surgery;  Laterality: N/A;   VITRECTOMY Left  10/03/2019   Dr. Elner, Vitrectomy, Focal Laser, Removal of Silicone Oil   Social History:  reports that she quit smoking about 8 years ago. Her smoking use included cigarettes. She has been exposed to tobacco smoke. She has never used smokeless tobacco. She reports that she does not currently use alcohol. She reports that she does not use drugs.  Allergies  Allergen Reactions   Amoxicillin -Pot Clavulanate Diarrhea   Povidone-Iodine Other (See Comments)    Burning   Sulfa Antibiotics Other (See Comments)    Unknown    Sulfamethoxazole Other (See Comments)    Unknown     Family History  Problem Relation Age of Onset   Heart disease Mother    COPD Father    Cancer Sister        unknown primary   Cancer Brother        unknown primary   Cancer Brother    Hypertension Son    Colon cancer Neg Hx     Prior to Admission medications   Medication Sig Start Date End Date Taking? Authorizing Provider  acetaminophen  (TYLENOL ) 500 MG tablet Take 1,000 mg by mouth every 8 (eight) hours as needed for mild pain or moderate pain. 03/21/17  Yes [provider]  albuterol  (PROVENTIL ) (2.5 MG/3ML) 0.083% nebulizer solution Take 2.5 mg by nebulization every 6 (  six) hours as needed for shortness of breath or wheezing.   Yes [provider]  albuterol  (VENTOLIN  HFA) 108 (90 Base) MCG/ACT inhaler Inhale 2 puffs into the lungs every 4 (four) hours as needed for wheezing or shortness of breath. 10/14/23  Yes [provider]  amiodarone  (PACERONE ) 200 MG tablet Take 1 Tablet ( 200 mg ) Daily and none on Sunday Patient taking differently: Take 200 mg by mouth daily. Take 1 Tablet ( 200 mg ) Daily and none on Sunday 10/25/23  Yes Waddell Danelle ORN, MD  amLODipine  (NORVASC ) 5 MG tablet Take 5 mg by mouth daily. 08/15/23  Yes [provider]  apixaban  (ELIQUIS ) 2.5 MG TABS tablet Take 1 tablet (2.5 mg total) by mouth 2 (two) times daily. 10/25/23  Yes Alvan Dorn FALCON, MD   ferrous sulfate  324 MG TBEC Take 324 mg by mouth daily with breakfast.   Yes [provider]  levothyroxine  (SYNTHROID ) 75 MCG tablet Take 75 mcg by mouth daily before breakfast. 10/20/23  Yes [provider]  losartan (COZAAR) 25 MG tablet Take 25 mg by mouth at bedtime.   Yes [provider]  lubiprostone  (AMITIZA ) 24 MCG capsule Take 1 capsule (24 mcg total) by mouth 2 (two) times daily with a meal. 12/29/23  Yes Shirlean Therisa ORN, NP  nitrofurantoin, macrocrystal-monohydrate, (MACROBID) 100 MG capsule Take 100 mg by mouth 2 (two) times daily. 01/26/24  Yes [provider]  nitroGLYCERIN  (NITROSTAT ) 0.4 MG SL tablet Place 1 tablet (0.4 mg total) under the tongue every 5 (five) minutes as needed for chest pain. 10/14/23  Yes BranchDorn FALCON, MD  pantoprazole  (PROTONIX ) 40 MG tablet Take 1 tablet (40 mg total) by mouth 2 (two) times daily. 09/12/23 01/27/24 Yes Shah, Pratik D, DO  prednisoLONE  acetate (PRED FORTE ) 1 % ophthalmic suspension Place 1 drop into the right eye See admin instructions. Place 1 drop in right eye 4 times daily for 1 week, 3 times daily for 1 week, 2 times daily for 1 week, then 1 time daily for 1 week, then stop.   Yes [provider]  rosuvastatin  (CRESTOR ) 20 MG tablet Take 20 mg by mouth daily. 02/26/17  Yes [provider]  timolol (TIMOPTIC) 0.5 % ophthalmic solution Place 1 drop into the right eye 2 (two) times daily.   Yes [provider]  traZODone  (DESYREL ) 50 MG tablet Take 50 mg by mouth at bedtime. 08/31/23  Yes [provider]  DOMINIC BECK 200-62.5-25 MCG/ACT AEPB Inhale 1 puff into the lungs daily. 11/02/22  Yes [provider]    Physical Exam: Vitals:   01/27/24 2302 01/27/24 2315 01/27/24 2316 01/27/24 2349  BP:  (!) 142/62  126/72  Pulse: 89  88 90  Resp: 10 12 15 20   Temp:    98.7 F (37.1 C)  TempSrc:    Oral  SpO2: 95%  93% 95%  Weight:      Height:        General:  Elderly female. Awake and alert and oriented x3. Not in any acute distress.  HEENT: NCAT.  PERRLA. EOMI. Sclerae anicteric.  Dry mucosal membranes. Neck: Neck supple without lymphadenopathy. No carotid bruits. No masses palpated.  Cardiovascular: Regular rate with normal S1-S2 sounds. No murmurs, rubs or gallops auscultated. No JVD.  Respiratory: Clear breath sounds.  No accessory muscle use. Abdomen: Soft, tender to palpation of right CVA and RLQ, nondistended. Active bowel sounds. No masses or hepatosplenomegaly  Skin: No  rashes, lesions, or ulcerations.  Dry, warm to touch. Musculoskeletal:  2+ dorsalis pedis and radial pulses. Good ROM.  No contractures  Psychiatric: Intact judgment and insight.  Mood appropriate to current condition. Neurologic: No focal neurological deficits. Strength is 5/5 x 4.  CN II - XII grossly intact.   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   Mixed hyperlipidemia Continue Crestor   COPD (not in acute exacerbation) Continue albuterol  as  needed  Depression Continue trazodone .     Advance Care Planning: Full code  Consults: None  Family Communication: None at bedside  Severity of Illness: The appropriate patient status for this patient is INPATIENT. Inpatient status is judged to be reasonable and necessary in order to provide the required intensity of service to ensure the patient's safety. The patient's presenting symptoms, physical exam findings, and initial radiographic and laboratory data in the context of their chronic comorbidities is felt to place them at high risk for further clinical deterioration. Furthermore, it is not anticipated that the patient will be medically stable for discharge from the hospital within 2 midnights of admission.   * I certify that at the point of admission it is my clinical judgment that the patient will require inpatient hospital care spanning beyond 2 midnights from the point of admission due to high intensity of service, high risk for further deterioration and high frequency of surveillance required.*  Author: Josanna Hefel, DO 01/28/2024 2:10 AM  For on call review [email or web address].

## 2024-01-27 NOTE — ED Notes (Signed)
 Code sepsis activated after administration of first antibiotics. Cultures and lactic pulled after first antibiotic, but before second antibiotic.

## 2024-01-27 NOTE — ED Notes (Signed)
 Patient transported to CT

## 2024-01-27 NOTE — Sepsis Progress Note (Signed)
 Elink monitoring for the code sepsis protocol.

## 2024-01-28 DIAGNOSIS — N179 Acute kidney failure, unspecified: Secondary | ICD-10-CM | POA: Diagnosis not present

## 2024-01-28 DIAGNOSIS — N10 Acute tubulo-interstitial nephritis: Secondary | ICD-10-CM | POA: Diagnosis not present

## 2024-01-28 DIAGNOSIS — E8809 Other disorders of plasma-protein metabolism, not elsewhere classified: Secondary | ICD-10-CM | POA: Insufficient documentation

## 2024-01-28 DIAGNOSIS — I10 Essential (primary) hypertension: Secondary | ICD-10-CM | POA: Diagnosis not present

## 2024-01-28 DIAGNOSIS — E039 Hypothyroidism, unspecified: Secondary | ICD-10-CM | POA: Diagnosis not present

## 2024-01-28 LAB — CBC
HCT: 29.6 % — ABNORMAL LOW (ref 36.0–46.0)
Hemoglobin: 9.2 g/dL — ABNORMAL LOW (ref 12.0–15.0)
MCH: 30.8 pg (ref 26.0–34.0)
MCHC: 31.1 g/dL (ref 30.0–36.0)
MCV: 99 fL (ref 80.0–100.0)
Platelets: 88 K/uL — ABNORMAL LOW (ref 150–400)
RBC: 2.99 MIL/uL — ABNORMAL LOW (ref 3.87–5.11)
RDW: 16.5 % — ABNORMAL HIGH (ref 11.5–15.5)
WBC: 11.4 K/uL — ABNORMAL HIGH (ref 4.0–10.5)
nRBC: 0 % (ref 0.0–0.2)

## 2024-01-28 LAB — COMPREHENSIVE METABOLIC PANEL WITH GFR
ALT: 14 U/L (ref 0–44)
AST: 38 U/L (ref 15–41)
Albumin: 2.9 g/dL — ABNORMAL LOW (ref 3.5–5.0)
Alkaline Phosphatase: 69 U/L (ref 38–126)
Anion gap: 12 (ref 5–15)
BUN: 26 mg/dL — ABNORMAL HIGH (ref 8–23)
CO2: 24 mmol/L (ref 22–32)
Calcium: 8.2 mg/dL — ABNORMAL LOW (ref 8.9–10.3)
Chloride: 100 mmol/L (ref 98–111)
Creatinine, Ser: 1.67 mg/dL — ABNORMAL HIGH (ref 0.44–1.00)
GFR, Estimated: 31 mL/min — ABNORMAL LOW (ref >60)
Glucose, Bld: 76 mg/dL (ref 70–99)
Potassium: 3.8 mmol/L (ref 3.5–5.1)
Sodium: 135 mmol/L (ref 135–145)
Total Bilirubin: 0.4 mg/dL (ref 0.0–1.2)
Total Protein: 5.6 g/dL — ABNORMAL LOW (ref 6.5–8.1)

## 2024-01-28 LAB — STREP PNEUMONIAE URINARY ANTIGEN: Strep Pneumo Urinary Antigen: NEGATIVE

## 2024-01-28 LAB — MAGNESIUM: Magnesium: 1.9 mg/dL (ref 1.7–2.4)

## 2024-01-28 LAB — LACTIC ACID, PLASMA: Lactic Acid, Venous: 0.8 mmol/L (ref 0.5–1.9)

## 2024-01-28 LAB — PROCALCITONIN: Procalcitonin: 18.15 ng/mL

## 2024-01-28 LAB — PHOSPHORUS: Phosphorus: 4 mg/dL (ref 2.5–4.6)

## 2024-01-28 MED ORDER — ENSURE PLUS HIGH PROTEIN PO LIQD
237.0000 mL | Freq: Two times a day (BID) | ORAL | Status: DC
  Administered 2024-01-28 – 2024-01-31 (×7): 237 mL via ORAL

## 2024-01-28 MED ORDER — ONDANSETRON HCL 4 MG/2ML IJ SOLN: 4.0000 mg | Freq: Four times a day (QID) | INTRAMUSCULAR | Status: DC | PRN

## 2024-01-28 MED ORDER — ROSUVASTATIN CALCIUM 20 MG PO TABS
20.0000 mg | ORAL_TABLET | Freq: Every day | ORAL | Status: DC
  Administered 2024-01-28 – 2024-01-31 (×4): 20 mg via ORAL
  Filled 2024-01-28 (×4): qty 1

## 2024-01-28 MED ORDER — ACETAMINOPHEN 650 MG RE SUPP: 650.0000 mg | Freq: Four times a day (QID) | RECTAL | Status: DC | PRN

## 2024-01-28 MED ORDER — APIXABAN 2.5 MG PO TABS
2.5000 mg | ORAL_TABLET | Freq: Two times a day (BID) | ORAL | Status: DC
  Administered 2024-01-28 – 2024-01-31 (×7): 2.5 mg via ORAL
  Filled 2024-01-28 (×7): qty 1

## 2024-01-28 MED ORDER — ONDANSETRON HCL 4 MG PO TABS: 4.0000 mg | ORAL_TABLET | Freq: Four times a day (QID) | ORAL | Status: DC | PRN

## 2024-01-28 MED ORDER — ACETAMINOPHEN 325 MG PO TABS: 650.0000 mg | ORAL_TABLET | Freq: Four times a day (QID) | ORAL | Status: DC | PRN

## 2024-01-28 MED ORDER — AMIODARONE HCL 200 MG PO TABS
200.0000 mg | ORAL_TABLET | Freq: Every day | ORAL | Status: DC
  Administered 2024-01-28 – 2024-01-31 (×4): 200 mg via ORAL
  Filled 2024-01-28 (×4): qty 1

## 2024-01-28 MED ORDER — LACTATED RINGERS IV BOLUS: 500.0000 mL | Freq: Once | INTRAVENOUS | Status: DC

## 2024-01-28 MED ORDER — IPRATROPIUM-ALBUTEROL 0.5-2.5 (3) MG/3ML IN SOLN: 3.0000 mL | Freq: Four times a day (QID) | RESPIRATORY_TRACT | Status: DC | PRN

## 2024-01-28 MED ORDER — LEVOTHYROXINE SODIUM 75 MCG PO TABS
75.0000 ug | ORAL_TABLET | Freq: Every day | ORAL | Status: DC
  Administered 2024-01-28 – 2024-01-31 (×4): 75 ug via ORAL
  Filled 2024-01-28 (×4): qty 1

## 2024-01-28 MED ORDER — TRAZODONE HCL 50 MG PO TABS
50.0000 mg | ORAL_TABLET | Freq: Every day | ORAL | Status: DC
  Administered 2024-01-28 – 2024-01-30 (×3): 50 mg via ORAL
  Filled 2024-01-28 (×3): qty 1

## 2024-01-28 MED ORDER — ALBUTEROL SULFATE (2.5 MG/3ML) 0.083% IN NEBU: 2.5000 mg | INHALATION_SOLUTION | Freq: Four times a day (QID) | RESPIRATORY_TRACT | Status: DC | PRN

## 2024-01-28 MED ORDER — SODIUM CHLORIDE 0.9 % IV SOLN
1.0000 g | INTRAVENOUS | Status: DC
  Administered 2024-01-28 – 2024-01-30 (×3): 1 g via INTRAVENOUS
  Filled 2024-01-28 (×3): qty 10

## 2024-01-28 MED ORDER — TRAMADOL HCL 50 MG PO TABS
100.0000 mg | ORAL_TABLET | Freq: Two times a day (BID) | ORAL | Status: DC | PRN
  Administered 2024-01-28 – 2024-01-30 (×5): 100 mg via ORAL
  Filled 2024-01-28 (×5): qty 2

## 2024-01-28 MED ORDER — AMLODIPINE BESYLATE 5 MG PO TABS
5.0000 mg | ORAL_TABLET | Freq: Every day | ORAL | Status: DC
  Administered 2024-01-28: 5 mg via ORAL
  Filled 2024-01-28: qty 1

## 2024-01-28 NOTE — Plan of Care (Signed)
  Problem: Clinical Measurements: Goal: Respiratory complications will improve Outcome: Progressing   Problem: Pain Managment: Goal: General experience of comfort will improve and/or be controlled Outcome: Progressing   Problem: Safety: Goal: Ability to remain free from injury will improve Outcome: Progressing   Problem: Activity: Goal: Ability to tolerate increased activity will improve Outcome: Progressing   Problem: Respiratory: Goal: Ability to maintain adequate ventilation will improve Outcome: Progressing

## 2024-01-28 NOTE — TOC Initial Note (Signed)
 Transition of Care [facility]) - Initial/Assessment Note    Patient Details  Name: Patricia Jackson MRN: [id number] Date of Birth: Aug 26, 1940  Transition of Care [facility]) CM/SW Contact:    Lorraine LILLETTE Fenton, LCSW Phone Number: 01/28/2024, 2:36 PM  Clinical Narrative:                 Pt is high risk for readmission. CSW completed assessment with pt at bedside, pt had to turn tv volume down, hearing aid batteries not working.  Pt lives with son and daughter-in law in a single family home. The family cooks her meals, she is able to bathe herself and has a [place] at home.  Pt uses a 4 wheel walker at home. Pts family provides transportation to appointments.  Discussed multiple hospital visits, pt states she has pain and discomfort in her shoulders and they cannot figure it out and this is why.  Pt  asked am I going home today?, the Dr. Did not say that earlier.  CSW let pt know Dr. Alejandro DC- it appears pt wanted to stay an additional night so was anxious in asking if she was DC today.  ICM following.    Expected Discharge Plan: Home/Self Care Barriers to Discharge: Continued Medical Work up   Patient Goals and CMS Choice Patient states their goals for this hospitalization and ongoing recovery are:: Pt wants to return home with son and daughter in law assisting.          Expected Discharge Plan and Services In-house Referral: Clinical Social Work     Living arrangements for the past 2 months: Single Family Home (She is not allowed to go to basement due to stairs.)                                      Prior Living Arrangements/Services Living arrangements for the past 2 months: Single Family Home (She is not allowed to go to basement due to stairs.) Lives with:: Adult Children Patient language and need for interpreter reviewed:: Yes Do you feel safe going back to the place where you live?: Yes      Need for Family Participation in Patient Care: Yes (Comment) Care giver support  system in place?: Yes (comment)   Criminal Activity/Legal Involvement Pertinent to Current Situation/Hospitalization: No - Comment as needed  Activities of Daily Living   ADL Screening (condition at time of admission) Independently performs ADLs?: No Does the patient have a NEW difficulty with bathing/dressing/toileting/self-feeding that is expected to last >3 days?: No Does the patient have a NEW difficulty with getting in/out of bed, walking, or climbing stairs that is expected to last >3 days?: No Does the patient have a NEW difficulty with communication that is expected to last >3 days?: No Is the patient deaf or have difficulty hearing?: Yes Does the patient have difficulty seeing, even when wearing glasses/contacts?: Yes Does the patient have difficulty concentrating, remembering, or making decisions?: No  Permission Sought/Granted                  Emotional Assessment Appearance:: Appears stated age Attitude/Demeanor/Rapport: Lethargic Affect (typically observed): Accepting Orientation: : Oriented to Self, Oriented to Place Alcohol / Substance Use: Not Applicable Psych Involvement: No (comment)  Admission diagnosis:  Pyelonephritis [N12] Acute pyelonephritis [N10] Pneumonia of right upper lobe due to infectious organism [J18.9] Patient Active Problem List   Diagnosis Date Noted  Hypoalbuminemia due to protein-calorie malnutrition 01/28/2024   Anemia, unspecified 01/27/2024   Acute pyelonephritis 01/27/2024   Constipation 12/29/2023   History of colon polyps 12/29/2023   Tobacco abuse 11/16/2023   Chronic anticoagulation 09/12/2023   Melena 09/06/2023   Symptomatic anemia 09/05/2023   Anemia of chronic renal failure 08/22/2023   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   Severe anemia 06/16/2023   Acute blood loss anemia 06/15/2023   Elevated brain natriuretic peptide (BNP) level 02/03/2023   Paroxysmal atrial fibrillation with RVR (HCC) 02/02/2023    Malnutrition of moderate degree 01/24/2023   Atrial fibrillation with RVR (HCC) 01/20/2023   On anticoagulant therapy 11/21/2022   ABLA (acute blood loss anemia) 11/21/2022   Raynaud's syndrome 11/20/2022   Osteoarthritis 11/20/2022   Generalized anxiety disorder 11/18/2022   Positive antinuclear antibody 11/02/2022   Insomnia 11/02/2022   Gastroesophageal reflux disease 11/01/2022   Abrasion of lower back 10/11/2022   Paroxysmal atrial fibrillation (HCC) 09/23/2022   Vitamin D  deficiency 09/23/2022   Syncope 09/13/2022   CAP (community acquired pneumonia) 09/12/2022   sepsis from pneumonia 07/26/2022   intermittent confusion 07/26/2022   Hyponatremia 07/26/2022   Preoperative evaluation of a medical condition to rule out surgical contraindications (TAR required) 05/14/2022   Fracture of distal end of radius 05/02/2022   Left wrist pain 04/28/2022   Osteoporosis 01/19/2022   DOE (dyspnea on exertion)    Acidosis, unspecified 06/20/2021   Elevated troponin 06/19/2021   IDA (iron  deficiency anemia) 06/12/2021   Acquired hypothyroidism 05/15/2021   CKD stage 3b, GFR 30-44 ml/min (HCC) 05/15/2021   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   Essential hypertension 12/10/2020   Retinal hemorrhage, right eye 11/04/2020   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   Traction detachment of left retina 08/03/2019   Left retinal detachment 08/01/2019   Age-related macular degeneration 08/01/2019   Retinal hemorrhage of left eye 08/01/2019   Advanced nonexudative age-related macular degeneration of left eye with subfoveal involvement 08/01/2019   Retinal detachment 07/26/2019   Choroidal detachment of right eye 07/26/2019   Exudative retinopathy of left eye 07/26/2019   Thrombocytopenic disorder 07/04/2019   Orthostatic syncope 08/27/2017   Atypical chest pain 08/27/2017   Atrial flutter (HCC) 10/28/2016   Chronic kidney disease, stage 4 (severe) (HCC) 10/28/2016   Chronic obstructive  pulmonary disease (HCC) 10/28/2016   Depression 10/28/2016   Acute kidney injury superimposed on stage 4 chronic kidney disease (HCC) 09/08/2016   Hyperglycemia 09/08/2016   Esophageal dysphagia    Anemia 04/07/2016   CAD in native artery 03/16/2016   S/P angioplasty with stent 03/15/16 DES Resolute, pLCX 03/16/2016   NSTEMI (non-ST elevated myocardial infarction) (HCC) 03/12/2016   Dyspnea on exertion    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 [place] [address] Phone: [phone] Fax: [phone]     Social Drivers of Health (SDOH) Social History: SDOH Screenings   Food Insecurity: No Food Insecurity (01/27/2024)  Housing: Low Risk  (01/27/2024)  Transportation Needs: No Transportation Needs (01/27/2024)  Utilities: Not At Risk (01/27/2024)  Depression (PHQ2-9): Low Risk  (11/16/2023)  Financial Resource Strain: Low Risk  (11/26/2022)  Social Connections: Moderately Integrated (01/27/2024)  Stress: No Stress  Concern Present (11/26/2022)  Tobacco Use: Medium Risk (01/27/2024)  Health Literacy: Adequate Health Literacy (11/26/2022)   SDOH Interventions:     Readmission Risk Interventions    01/28/2024    2:32 PM 01/28/2024   12:39 PM 11/07/2023   11:39 AM  Readmission Risk Prevention Plan  Transportation Screening  Complete Complete  PCP or Specialist Appt within 3-5 Days   Complete  HRI or Home Care Consult   Complete  Palliative Care Screening   Not Applicable  Medication Review (RN Care Manager)  Complete Complete  PCP or Specialist appointment within 3-5 days of discharge  Not Complete   PCP/Specialist Appt Not Complete comments Talked to pt about following up with  PCP at DC    [facility] or Home Care Consult  Complete   SW Recovery Care/Counseling Consult  Complete

## 2024-01-28 NOTE — Progress Notes (Signed)
 [facility]                                                                               Patricia Jackson, is a 83 y.o. female, DOB - 29-Jul-1940, FMW:[id number] Admit date - 01/27/2024    Outpatient Primary MD for the patient is Shona, Norleen PEDLAR, MD  LOS - 1  days    Brief summary    Patricia Jackson is a 83 y.o. female with medical history significant of hypertension, hyperlipidemia, hypothyroidism, atrial fibrillation on chronic anticoagulation, COPD,  chronic kidney disease, anemia of chronic kidney disease and chronic GI bleed who presents to the emergency department with  10-day onset of right flank pain with extension of the pain into the right groin, she also complained of cough which started about the same time and has been worsening in the last few days.  Cough was productive, but was unable to tell me the color of the phlegm/sputum, cough this morning resulted in posttussive vomiting x 1.  She complained of several months of black bowel movements . CT abdomen and pelvis without contrast was suggestive of acute pyelonephritis She was treated with IV ceftriaxone  and azithromycin , IV hydration was provided,. Chest x-ray showed new  spiculated nodular opacity within the right upper lung zone, possibly infectious or inflammatory in the acute setting.   Assessment & Plan    Assessment and Plan:   Community acquired pneumonia.  Continue with IV ceftriaxone  and azithromycin .   Follow blood cultures and sputum cultures. Continue with the Mucinex  and flutter valve at this time. Improving leukocytosis.    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 Creatinine has improved from 1.8 to 1.67.  Bicarb wnl.  Lactic acid wnl.   Acute pyelonephritis/ acute cystitis.  CT abdomen pelvis showing acute pyelonephritis. Continue with IV ceftriaxone . Follow-up urine and blood cultures. Pain control for acute pyelonephritis.    Atrial  fibrillation Rate controlled on amiodarone  and patient on Eliquis  for anticoagulation.    COPD No wheezing heard Continue with DuoNebs.   Anemia of chronic disease Hemoglobin around 9. Normocytic.  Transfuse to keep hemoglobin greater than 7.     Thrombocytopenia Monitor.      Estimated body mass index is 19.77 kg/m as calculated from the following:   Height as of this encounter: 5' 8 (1.727 m).   Weight as of this encounter: 59 kg.  Code Status: full code.  DVT Prophylaxis:  apixaban  (ELIQUIS ) tablet 2.5 mg Start: 01/28/24 1000 SCDs Start: 01/28/24 0151 apixaban  (ELIQUIS ) tablet 2.5 mg   Level of Care: Level of care: Med-Surg Family Communication: none at bedside.   Disposition Plan:     Remains inpatient appropriate:  pending.   Procedures:  None.   Consultants:   None.   Antimicrobials:   Anti-infectives (From admission, onward)    Start     Dose/Rate Route Frequency Ordered Stop   01/28/24 2100  cefTRIAXone  (ROCEPHIN ) 1 g in sodium chloride  0.9 % 100 mL IVPB        1 g 200 mL/hr over 30 Minutes Intravenous Every 24 hours 01/28/24 0148     01/27/24 2245  azithromycin  (  ZITHROMAX ) 500 mg in sodium chloride  0.9 % 250 mL IVPB        500 mg 250 mL/hr over 60 Minutes Intravenous Every 24 hours 01/27/24 2230     01/27/24 2130  cefTRIAXone  (ROCEPHIN ) 1 g in sodium chloride  0.9 % 100 mL IVPB        1 g 200 mL/hr over 30 Minutes Intravenous  Once 01/27/24 2116 01/27/24 2206        Medications  Scheduled Meds:  amiodarone   200 mg Oral Daily   amLODipine   5 mg Oral Daily   apixaban   2.5 mg Oral BID   feeding supplement  237 mL Oral BID BM   levothyroxine   75 mcg Oral QAC breakfast   rosuvastatin   20 mg Oral Daily   traZODone   50 mg Oral QHS   Continuous Infusions:  azithromycin  Stopped (01/28/24 0033)   cefTRIAXone  (ROCEPHIN )  IV     PRN Meds:.acetaminophen  **OR** acetaminophen , albuterol , ipratropium-albuterol , ondansetron  **OR** ondansetron   (ZOFRAN ) IV, traMADol     Subjective:   Patricia Jackson was seen and examined today.  Abdominal discomfort, back pain no nausea, vomiting.   Objective:   Vitals:   01/27/24 2316 01/27/24 2349 01/28/24 0424 01/28/24 1301  BP:  126/72 (!) 100/54 (!) 89/46  Pulse: 88 90 66 60  Resp: 15 20 18 16   Temp:  98.7 F (37.1 C) 97.8 F (36.6 C) 97.8 F (36.6 C)  TempSrc:  Oral Oral Oral  SpO2: 93% 95% 98% 97%  Weight:      Height:        Intake/Output Summary (Last 24 hours) at 01/28/2024 1652 Last data filed at 01/28/2024 1246 Gross per 24 hour  Intake 1579.75 ml  Output --  Net 1579.75 ml   Filed Weights   01/27/24 1701  Weight: 59 kg     Exam General exam: elderly ill appearing lady not in distress.  Respiratory system: Clear to auscultation. Respiratory effort normal. Cardiovascular system: S1 & S2 heard, RRR. No JVD, Gastrointestinal system: Abdomen is  soft lower abdominal tenderness and some flank pain.  Central nervous system: Alert and oriented.  Extremities: no pedal edema.  Skin: No rashes,     Data Reviewed:  I have personally reviewed following labs and imaging studies   CBC Lab Results  Component Value Date   WBC 11.4 (H) 01/28/2024   RBC 2.99 (L) 01/28/2024   HGB 9.2 (L) 01/28/2024   HCT 29.6 (L) 01/28/2024   MCV 99.0 01/28/2024   MCH 30.8 01/28/2024   PLT 88 (L) 01/28/2024   MCHC 31.1 01/28/2024   RDW 16.5 (H) 01/28/2024   LYMPHSABS 0.7 01/27/2024   MONOABS 1.0 01/27/2024   EOSABS 0.0 01/27/2024   BASOSABS 0.0 01/27/2024     Last metabolic panel Lab Results  Component Value Date   NA 135 01/28/2024   K 3.8 01/28/2024   CL 100 01/28/2024   CO2 24 01/28/2024   BUN 26 (H) 01/28/2024   CREATININE 1.67 (H) 01/28/2024   GLUCOSE 76 01/28/2024   GFRNONAA 31 (L) 01/28/2024   GFRAA 38 (L) 07/06/2019   CALCIUM  8.2 (L) 01/28/2024   PHOS 4.0 01/28/2024   PROT 5.6 (L) 01/28/2024   ALBUMIN  2.9 (L) 01/28/2024   LABGLOB 2.5 05/26/2021    AGRATIO 1.4 05/26/2021   BILITOT 0.4 01/28/2024   ALKPHOS 69 01/28/2024   AST 38 01/28/2024   ALT 14 01/28/2024   ANIONGAP 12 01/28/2024    CBG (last 3)  No results for  input(s): GLUCAP in the last 72 hours.    Coagulation Profile: Recent Labs  Lab 01/27/24 1736  INR 1.5*     Radiology Studies: DG Chest Portable 1 View Result Date: 01/27/2024 EXAM: 1 VIEW XRAY OF THE CHEST 01/27/2024 10:05:33 PM COMPARISON: None available. CLINICAL HISTORY: Cough. Pt to er room number one, pt states that her md called her and told her that her heme was 4.1, states that this will be her 4th or 5th transfusion for lower h/h. Pt states that she is having black and tarry bm. FINDINGS: LUNGS AND PLEURA: The lungs are symmetrically hyperinflated in keeping with changes of underlying COPD. Chronic interstitial thickening. New spiculated nodular opacity within the right upper lung zone, possibly infectious or inflammatory and acute setting. No pleural effusion. HEART AND MEDIASTINUM: Atherosclerotic calcification within the thoracic aorta. Cardiac size within normal limits. BONES AND SOFT TISSUES: Surgical clips within the left axilla. No acute bony abnormality. IMPRESSION: 1. New spiculated nodular opacity within the right upper lung zone, possibly infectious or inflammatory in the acute setting. Follow-up chest radiograph is recommended in 3-4 weeks, following conservative therapy, to document resolution. If persistent at that time, dedicated contrast-enhanced CT imaging of the chest is recommended for further evaluation. 2. COPD Electronically signed by: Dorethia Molt MD 01/27/2024 10:10 PM EDT RP Workstation: [id number]   CT ABDOMEN PELVIS WO CONTRAST Result Date: 01/27/2024 EXAM: CT ABDOMEN AND PELVIS WITHOUT CONTRAST 01/27/2024 08:34:47 PM TECHNIQUE: CT of the abdomen and pelvis was performed without the administration of intravenous contrast. Multiplanar reformatted images are provided for review.  Automated exposure control, iterative reconstruction, and/or weight-based adjustment of the mA/kV was utilized to reduce the radiation dose to as low as reasonably achievable. COMPARISON: 08/23/2021 CLINICAL HISTORY: Abdominal pain, acute, nonlocalized; gen weakness, abd pain, AKI. General abd pain, weakness, DKI.; Pt to er room number one, pt states that her md called her and told her that her heme was 4.1, states that this will be her 4th or 5th transfusion for lower h/h. Pt states that she is having black and tarry bm. FINDINGS: LIMITATIONS/ARTIFACTS: Severely motion-degraded images, limiting evaluation. LOWER CHEST: No acute abnormality. LIVER: The liver is unremarkable. GALLBLADDER AND BILE DUCTS: Gallbladder is unremarkable. No biliary ductal dilatation. SPLEEN: No acute abnormality. PANCREAS: No acute abnormality. ADRENAL GLANDS: No acute abnormality. KIDNEYS, URETERS AND BLADDER: 1.8 cm left renal cyst (image 19), benign (Bosniak I), no follow up recommended. Stable fullness of the right renal pelvis, favoring extrarenal pelvis over hydronephrosis. Mild right perinephric stranding (image 36), new, suggesting the possibility of pyelonephritis. No stones in the kidneys or ureters. Urinary bladder is unremarkable. GI AND BOWEL: Stomach demonstrates no acute abnormality. Multiple diverticula mainly in the left hemicolon, without signs of diverticulitis. There is no bowel obstruction. PERITONEUM AND RETROPERITONEUM: No ascites. No free air. VASCULATURE: Aorta is normal in caliber. Atherosclerotic calcifications of the abdominal aorta and branch vessels. LYMPH NODES: No lymphadenopathy. REPRODUCTIVE ORGANS: No acute abnormality. BONES AND SOFT TISSUES: No acute osseous abnormality. No focal soft tissue abnormality. IMPRESSION: 1. Severely motion-degraded exam, limiting evaluation. 2. Mild right perinephric stranding, new, suspicious for acute pyelonephritis. 3. Otherwise negative. Electronically signed by: Pinkie Pebbles MD 01/27/2024 08:40 PM EDT RP Workstation: [id number]       Elgie Butter M.D. [facility] 01/28/2024, 4:52 PM  Available via Epic secure chat 7am-7pm After 7 pm, please refer to night coverage provider listed on amion.

## 2024-01-29 DIAGNOSIS — N10 Acute tubulo-interstitial nephritis: Secondary | ICD-10-CM | POA: Diagnosis not present

## 2024-01-29 DIAGNOSIS — J449 Chronic obstructive pulmonary disease, unspecified: Secondary | ICD-10-CM

## 2024-01-29 DIAGNOSIS — N179 Acute kidney failure, unspecified: Secondary | ICD-10-CM | POA: Diagnosis not present

## 2024-01-29 DIAGNOSIS — J189 Pneumonia, unspecified organism: Secondary | ICD-10-CM | POA: Diagnosis not present

## 2024-01-29 DIAGNOSIS — E039 Hypothyroidism, unspecified: Secondary | ICD-10-CM

## 2024-01-29 DIAGNOSIS — N184 Chronic kidney disease, stage 4 (severe): Secondary | ICD-10-CM

## 2024-01-29 LAB — BASIC METABOLIC PANEL WITH GFR
Anion gap: 8 (ref 5–15)
BUN: 32 mg/dL — ABNORMAL HIGH (ref 8–23)
CO2: 24 mmol/L (ref 22–32)
Calcium: 8.3 mg/dL — ABNORMAL LOW (ref 8.9–10.3)
Chloride: 101 mmol/L (ref 98–111)
Creatinine, Ser: 1.48 mg/dL — ABNORMAL HIGH (ref 0.44–1.00)
GFR, Estimated: 35 mL/min — ABNORMAL LOW (ref >60)
Glucose, Bld: 90 mg/dL (ref 70–99)
Potassium: 4.2 mmol/L (ref 3.5–5.1)
Sodium: 133 mmol/L — ABNORMAL LOW (ref 135–145)

## 2024-01-29 LAB — CBC WITH DIFFERENTIAL/PLATELET
Abs Immature Granulocytes: 0.06 K/uL (ref 0.00–0.07)
Basophils Absolute: 0 K/uL (ref 0.0–0.1)
Basophils Relative: 0 %
Eosinophils Absolute: 0.1 K/uL (ref 0.0–0.5)
Eosinophils Relative: 1 %
HCT: 28.1 % — ABNORMAL LOW (ref 36.0–46.0)
Hemoglobin: 8.8 g/dL — ABNORMAL LOW (ref 12.0–15.0)
Immature Granulocytes: 1 %
Lymphocytes Relative: 7 %
Lymphs Abs: 0.6 K/uL — ABNORMAL LOW (ref 0.7–4.0)
MCH: 30.6 pg (ref 26.0–34.0)
MCHC: 31.3 g/dL (ref 30.0–36.0)
MCV: 97.6 fL (ref 80.0–100.0)
Monocytes Absolute: 0.9 K/uL (ref 0.1–1.0)
Monocytes Relative: 12 %
Neutro Abs: 6.3 K/uL (ref 1.7–7.7)
Neutrophils Relative %: 79 %
Platelets: 84 K/uL — ABNORMAL LOW (ref 150–400)
RBC: 2.88 MIL/uL — ABNORMAL LOW (ref 3.87–5.11)
RDW: 16.5 % — ABNORMAL HIGH (ref 11.5–15.5)
WBC: 7.9 K/uL (ref 4.0–10.5)
nRBC: 0 % (ref 0.0–0.2)

## 2024-01-29 LAB — URINE CULTURE: Culture: <10000 — AB

## 2024-01-29 MED ORDER — DOXYCYCLINE HYCLATE 100 MG PO TABS
100.0000 mg | ORAL_TABLET | Freq: Two times a day (BID) | ORAL | Status: DC
  Administered 2024-01-29 – 2024-01-31 (×4): 100 mg via ORAL
  Filled 2024-01-29 (×4): qty 1

## 2024-01-29 NOTE — Progress Notes (Signed)
 PROGRESS NOTE   Patricia Jackson  FMW:[id number] DOB: [birth year] DOA: 01/27/2024 PCP: Shona Norleen PEDLAR, MD   Chief Complaint  Patient presents with   Melena   Level of care: Med-Surg  Brief Admission History:  83 y.o. female with medical history significant of hypertension, hyperlipidemia, hypothyroidism, atrial fibrillation on chronic anticoagulation, COPD,  chronic kidney disease, anemia of chronic kidney disease and chronic GI bleed who presents to the emergency department with  10-day onset of right flank pain with extension of the pain into the right groin, she also complained of cough which started about the same time and has been worsening in the last few days.  Cough was productive, but was unable to tell me the color of the phlegm/sputum, cough this morning resulted in posttussive vomiting x 1.  She complained of several months of black bowel movements.  CT abdomen and pelvis without contrast was suggestive of acute pyelonephritis.  She was treated with IV ceftriaxone  and azithromycin , IV hydration was provided,. Chest x-ray showed new  spiculated nodular opacity within the right upper lung zone, possibly infectious or inflammatory in the acute setting.    Assessment and Plan:  Community acquired pneumonia.  Continue with IV ceftriaxone  and doxycycline .  Follow blood cultures and sputum cultures--NGTD Continue with the Mucinex  and flutter valve at this time. Resolved leukocytosis.     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 Creatinine has improved from 1.8 to 1.48.  Bicarb wnl.  Lactic acid wnl.    Acute pyelonephritis/ acute cystitis.  CT abdomen pelvis showing acute pyelonephritis. Continue with IV ceftriaxone . Follow-up urine and blood cultures. Pain control for acute pyelonephritis.   Atrial fibrillation Rate controlled on amiodarone  and patient on Eliquis  for anticoagulation.    COPD No wheezing heard Continue with  DuoNebs.   Anemia of chronic disease Hemoglobin around 9. Normocytic.  Transfuse to keep hemoglobin greater than 7.    Thrombocytopenia Monitor.   DVT prophylaxis: apixaban   Code Status: Full  Family Communication:  Disposition: anticipate home    Consultants:   Procedures:   Antimicrobials:  Ceftriaxone  10/24>>    Subjective: Pt reports she feels weak and ill.  No emesis but no appetite today.    Objective: Vitals:   01/28/24 1505 01/28/24 1928 01/29/24 0407 01/29/24 1225  BP: (!) 108/56 117/60 (!) 110/54 (!) 117/54  Pulse: 69 68 65 61  Resp:  18 18 20   Temp:  98.2 F (36.8 C) 98.2 F (36.8 C) 98.3 F (36.8 C)  TempSrc:  Oral Oral Oral  SpO2:  99% 94% 97%  Weight:      Height:        Intake/Output Summary (Last 24 hours) at 01/29/2024 1455 Last data filed at 01/28/2024 2124 Gross per 24 hour  Intake 720 ml  Output --  Net 720 ml   Filed Weights   01/27/24 1701  Weight: 59 kg   Examination:  General exam: Appears calm and comfortable  Respiratory system: Clear to auscultation. Respiratory effort normal. Cardiovascular system: normal S1 & S2 heard. No JVD, murmurs, rubs, gallops or clicks. No pedal edema. Gastrointestinal system: Abdomen is nondistended, soft and nontender. No organomegaly or masses felt. Normal bowel sounds heard. Central nervous system: Alert and oriented. No focal neurological deficits. Extremities: Symmetric 5 x 5 power. Skin: No rashes, lesions or ulcers. Psychiatry: Judgement and insight appear normal. Mood & affect appropriate.   Data Reviewed: I have personally reviewed following labs and imaging studies  CBC: Recent Labs  Lab 01/27/24 1700 01/28/24 0320 01/29/24 0408  WBC 13.3* 11.4* 7.9  NEUTROABS 11.5*  --  6.3  HGB 11.1* 9.2* 8.8*  HCT 34.7* 29.6* 28.1*  MCV 97.2 99.0 97.6  PLT 111* 88* 84*    Basic Metabolic Panel: Recent Labs  Lab 01/27/24 1700 01/28/24 0320 01/29/24 0408  NA 136 135 133*  K 3.7 3.8 4.2   CL 101 100 101  CO2 21* 24 24  GLUCOSE 100* 76 90  BUN 30* 26* 32*  CREATININE 1.89* 1.67* 1.48*  CALCIUM  9.0 8.2* 8.3*  MG  --  1.9  --   PHOS  --  4.0  --     CBG: No results for input(s): GLUCAP in the last 168 hours.  Recent Results (from the past 240 hours)  Urine Culture     Status: Abnormal   Collection Time: 01/27/24  9:05 PM   Specimen: Urine, Random  Result Value Ref Range Status   Specimen Description   Final    URINE, RANDOM Performed at [facility], [address], [place], [place] [address]    Special Requests   Final    NONE Reflexed from [id number] Performed at [facility], [address] [place], [place] [address]    Culture (A)  Final    <10,000 COLONIES/mL INSIGNIFICANT GROWTH Performed at [facility], [address], [place], [place] [address]    Report Status 01/29/2024 FINAL  Final  Resp panel by RT-PCR (RSV, Flu A&B, Covid) Anterior Nasal Swab     Status: None   Collection Time: 01/27/24 10:07 PM   Specimen: Anterior Nasal Swab  Result Value Ref Range Status   SARS Coronavirus 2 by RT PCR NEGATIVE NEGATIVE Final    Comment: (NOTE) SARS-CoV-2 target nucleic acids are NOT DETECTED.  The SARS-CoV-2 RNA is generally detectable in upper respiratory specimens during the acute phase of infection. The lowest concentration of SARS-CoV-2 viral copies this assay can detect is 138 copies/mL. A negative result does not preclude SARS-Cov-2 infection and should not be used as the sole basis for treatment or other patient management decisions. A negative result may occur with  improper specimen collection/handling, submission of specimen other than nasopharyngeal swab, presence of viral mutation(s) within the areas targeted by this assay, and inadequate number of viral copies(<138 copies/mL). A negative result must be combined with clinical observations, patient history, and epidemiological information. The expected result is Negative.  Fact Sheet  for Patients:  bloggercourse.com  Fact Sheet for Healthcare Providers:  seriousbroker.it  This test is no t yet approved or cleared by the [place]  FDA and  has been authorized for detection and/or diagnosis of SARS-CoV-2 by FDA under an Emergency Use Authorization (EUA). This EUA will remain  in effect (meaning this test can be used) for the duration of the COVID-19 declaration under Section 564(b)(1) of the Act, 21 U.S.C.section 360bbb-3(b)(1), unless the authorization is terminated  or revoked sooner.       Influenza A by PCR NEGATIVE NEGATIVE Final   Influenza B by PCR NEGATIVE NEGATIVE Final    Comment: (NOTE) The Xpert Xpress SARS-CoV-2/FLU/RSV plus assay is intended as an aid in the diagnosis of influenza from Nasopharyngeal swab specimens and should not be used as a sole basis for treatment. Nasal washings and aspirates are unacceptable for Xpert Xpress SARS-CoV-2/FLU/RSV testing.  Fact Sheet for Patients: bloggercourse.com  Fact Sheet for Healthcare Providers: seriousbroker.it  This test is not yet approved or  cleared by the [place]  FDA and has been authorized for detection and/or diagnosis of SARS-CoV-2 by FDA under an Emergency Use Authorization (EUA). This EUA will remain in effect (meaning this test can be used) for the duration of the COVID-19 declaration under Section 564(b)(1) of the Act, 21 U.S.C. section 360bbb-3(b)(1), unless the authorization is terminated or revoked.     Resp Syncytial Virus by PCR NEGATIVE NEGATIVE Final    Comment: (NOTE) Fact Sheet for Patients: bloggercourse.com  Fact Sheet for Healthcare Providers: seriousbroker.it  This test is not yet approved or cleared by the [place]  FDA and has been authorized for detection and/or diagnosis of SARS-CoV-2 by FDA under an  Emergency Use Authorization (EUA). This EUA will remain in effect (meaning this test can be used) for the duration of the COVID-19 declaration under Section 564(b)(1) of the Act, 21 U.S.C. section 360bbb-3(b)(1), unless the authorization is terminated or revoked.  Performed at [facility], [address] [place], [place] [address]   Blood culture (routine x 2)     Status: None (Preliminary result)   Collection Time: 01/27/24 10:50 PM   Specimen: BLOOD RIGHT HAND  Result Value Ref Range Status   Specimen Description BLOOD RIGHT HAND  Final   Special Requests   Final    BOTTLES DRAWN AEROBIC AND ANAEROBIC Blood Culture adequate volume   Culture   Final    NO GROWTH 2 DAYS Performed at [facility], [address], [place], [place] [address]    Report Status PENDING  Incomplete  Blood culture (routine x 2)     Status: None (Preliminary result)   Collection Time: 01/27/24 11:00 PM   Specimen: BLOOD RIGHT HAND  Result Value Ref Range Status   Specimen Description BLOOD RIGHT HAND  Final   Special Requests   Final    BOTTLES DRAWN AEROBIC AND ANAEROBIC Blood Culture adequate volume   Culture   Final    NO GROWTH 2 DAYS Performed at [facility], [address], [place], [place] [address]    Report Status PENDING  Incomplete     Radiology Studies: DG Chest Portable 1 View Result Date: 01/27/2024 EXAM: 1 VIEW XRAY OF THE CHEST 01/27/2024 10:05:33 PM COMPARISON: None available. CLINICAL HISTORY: Cough. Pt to er room number one, pt states that her md called her and told her that her heme was 4.1, states that this will be her 4th or 5th transfusion for lower h/h. Pt states that she is having black and tarry bm. FINDINGS: LUNGS AND PLEURA: The lungs are symmetrically hyperinflated in keeping with changes of underlying COPD. Chronic interstitial thickening. New spiculated nodular opacity within the right upper lung zone, possibly infectious or inflammatory and acute setting. No pleural  effusion. HEART AND MEDIASTINUM: Atherosclerotic calcification within the thoracic aorta. Cardiac size within normal limits. BONES AND SOFT TISSUES: Surgical clips within the left axilla. No acute bony abnormality. IMPRESSION: 1. New spiculated nodular opacity within the right upper lung zone, possibly infectious or inflammatory in the acute setting. Follow-up chest radiograph is recommended in 3-4 weeks, following conservative therapy, to document resolution. If persistent at that time, dedicated contrast-enhanced CT imaging of the chest is recommended for further evaluation. 2. COPD Electronically signed by: Dorethia Molt MD 01/27/2024 10:10 PM EDT RP Workstation: [id number]   CT ABDOMEN PELVIS WO CONTRAST Result Date: 01/27/2024 EXAM: CT ABDOMEN AND PELVIS WITHOUT CONTRAST 01/27/2024 08:34:47 PM TECHNIQUE: CT of the abdomen and pelvis was performed without the administration of intravenous contrast.  Multiplanar reformatted images are provided for review. Automated exposure control, iterative reconstruction, and/or weight-based adjustment of the mA/kV was utilized to reduce the radiation dose to as low as reasonably achievable. COMPARISON: 08/23/2021 CLINICAL HISTORY: Abdominal pain, acute, nonlocalized; gen weakness, abd pain, AKI. General abd pain, weakness, DKI.; Pt to er room number one, pt states that her md called her and told her that her heme was 4.1, states that this will be her 4th or 5th transfusion for lower h/h. Pt states that she is having black and tarry bm. FINDINGS: LIMITATIONS/ARTIFACTS: Severely motion-degraded images, limiting evaluation. LOWER CHEST: No acute abnormality. LIVER: The liver is unremarkable. GALLBLADDER AND BILE DUCTS: Gallbladder is unremarkable. No biliary ductal dilatation. SPLEEN: No acute abnormality. PANCREAS: No acute abnormality. ADRENAL GLANDS: No acute abnormality. KIDNEYS, URETERS AND BLADDER: 1.8 cm left renal cyst (image 19), benign (Bosniak I), no follow up  recommended. Stable fullness of the right renal pelvis, favoring extrarenal pelvis over hydronephrosis. Mild right perinephric stranding (image 36), new, suggesting the possibility of pyelonephritis. No stones in the kidneys or ureters. Urinary bladder is unremarkable. GI AND BOWEL: Stomach demonstrates no acute abnormality. Multiple diverticula mainly in the left hemicolon, without signs of diverticulitis. There is no bowel obstruction. PERITONEUM AND RETROPERITONEUM: No ascites. No free air. VASCULATURE: Aorta is normal in caliber. Atherosclerotic calcifications of the abdominal aorta and branch vessels. LYMPH NODES: No lymphadenopathy. REPRODUCTIVE ORGANS: No acute abnormality. BONES AND SOFT TISSUES: No acute osseous abnormality. No focal soft tissue abnormality. IMPRESSION: 1. Severely motion-degraded exam, limiting evaluation. 2. Mild right perinephric stranding, new, suspicious for acute pyelonephritis. 3. Otherwise negative. Electronically signed by: Pinkie Pebbles MD 01/27/2024 08:40 PM EDT RP Workstation: [id number]    Scheduled Meds:  amiodarone   200 mg Oral Daily   apixaban   2.5 mg Oral BID   doxycycline   100 mg Oral Q12H   feeding supplement  237 mL Oral BID BM   levothyroxine   75 mcg Oral QAC breakfast   rosuvastatin   20 mg Oral Daily   traZODone   50 mg Oral QHS   Continuous Infusions:  cefTRIAXone  (ROCEPHIN )  IV 1 g (01/28/24 2103)     LOS: 2 days   Time spent: 58 mins  Lyndie Vanderloop Vicci, MD How to contact the [facility] Attending or Consulting provider 7A - 7P or covering provider during after hours 7P -7A, for this patient?  Check the care team in [facility] and look for a) attending/consulting [facility] provider listed and b) the [facility] team listed Log into [email or web address] to find provider on call.  Locate the [facility] provider you are looking for under [facility] and page to a number that you can be directly reached. If you still have difficulty reaching the provider, please page the [facility]  (Director on Call) for the Hospitalists listed on amion for assistance.  01/29/2024, 2:55 PM

## 2024-01-29 NOTE — Hospital Course (Signed)
 83 y.o. female with medical history significant of hypertension, hyperlipidemia, hypothyroidism, atrial fibrillation on chronic anticoagulation, COPD,  chronic kidney disease, anemia of chronic kidney disease and chronic GI bleed who presents to the emergency department with  10-day onset of right flank pain with extension of the pain into the right groin, she also complained of cough which started about the same time and has been worsening in the last few days.  Cough was productive, but was unable to tell me the color of the phlegm/sputum, cough this morning resulted in posttussive vomiting x 1.  She complained of several months of black bowel movements.  CT abdomen and pelvis without contrast was suggestive of acute pyelonephritis.  She was treated with IV ceftriaxone  and azithromycin , IV hydration was provided,. Chest x-ray showed new  spiculated nodular opacity within the right upper lung zone, possibly infectious or inflammatory in the acute setting.

## 2024-01-29 NOTE — Plan of Care (Signed)
  Problem: Education: Goal: Knowledge of General Education information will improve Description: Including pain rating scale, medication(s)/side effects and non-pharmacologic comfort measures 01/29/2024 1643 by Sebastian Clarity, LPN Outcome: Progressing 01/29/2024 1643 by Sebastian Clarity, LPN Outcome: Progressing   Problem: Health Behavior/Discharge Planning: Goal: Ability to manage health-related needs will improve 01/29/2024 1643 by Sebastian Clarity, LPN Outcome: Progressing 01/29/2024 1643 by Sebastian Clarity, LPN Outcome: Progressing   Problem: Clinical Measurements: Goal: Ability to maintain clinical measurements within normal limits will improve 01/29/2024 1643 by Sebastian Clarity, LPN Outcome: Progressing 01/29/2024 1643 by Sebastian Clarity, LPN Outcome: Progressing Goal: Will remain free from infection 01/29/2024 1643 by Sebastian Clarity, LPN Outcome: Progressing 01/29/2024 1643 by Sebastian Clarity, LPN Outcome: Progressing Goal: Diagnostic test results will improve 01/29/2024 1643 by Sebastian Clarity, LPN Outcome: Progressing 01/29/2024 1643 by Sebastian Clarity, LPN Outcome: Progressing Goal: Respiratory complications will improve 01/29/2024 1643 by Sebastian Clarity, LPN Outcome: Progressing 01/29/2024 1643 by Sebastian Clarity, LPN Outcome: Progressing Goal: Cardiovascular complication will be avoided 01/29/2024 1643 by Sebastian Clarity, LPN Outcome: Progressing 01/29/2024 1643 by Sebastian Clarity, LPN Outcome: Progressing   Problem: Activity: Goal: Risk for activity intolerance will decrease 01/29/2024 1643 by Sebastian Clarity, LPN Outcome: Progressing 01/29/2024 1643 by Sebastian Clarity, LPN Outcome: Progressing   Problem: Nutrition: Goal: Adequate nutrition will be maintained 01/29/2024 1643 by Sebastian Clarity, LPN Outcome: Progressing 01/29/2024 1643 by Sebastian Clarity, LPN Outcome: Progressing   Problem: Coping: Goal: Level of anxiety will decrease 01/29/2024  1643 by Sebastian Clarity, LPN Outcome: Progressing 01/29/2024 1643 by Sebastian Clarity, LPN Outcome: Progressing   Problem: Elimination: Goal: Will not experience complications related to bowel motility Outcome: Progressing Goal: Will not experience complications related to urinary retention Outcome: Progressing   Problem: Pain Managment: Goal: General experience of comfort will improve and/or be controlled Outcome: Progressing   Problem: Safety: Goal: Ability to remain free from injury will improve Outcome: Progressing   Problem: Skin Integrity: Goal: Risk for impaired skin integrity will decrease Outcome: Progressing   Problem: Activity: Goal: Ability to tolerate increased activity will improve Outcome: Progressing   Problem: Clinical Measurements: Goal: Ability to maintain a body temperature in the normal range will improve Outcome: Progressing   Problem: Respiratory: Goal: Ability to maintain adequate ventilation will improve Outcome: Progressing Goal: Ability to maintain a clear airway will improve Outcome: Progressing

## 2024-01-29 NOTE — Plan of Care (Signed)
  Problem: Clinical Measurements: Goal: Will remain free from infection Outcome: Progressing Goal: Respiratory complications will improve Outcome: Progressing   Problem: Activity: Goal: Risk for activity intolerance will decrease Outcome: Progressing   Problem: Coping: Goal: Level of anxiety will decrease Outcome: Progressing   Problem: Elimination: Goal: Will not experience complications related to urinary retention Outcome: Progressing   Problem: Safety: Goal: Ability to remain free from injury will improve Outcome: Progressing   Problem: Skin Integrity: Goal: Risk for impaired skin integrity will decrease Outcome: Progressing   Problem: Activity: Goal: Ability to tolerate increased activity will improve Outcome: Progressing   Problem: Respiratory: Goal: Ability to maintain a clear airway will improve Outcome: Progressing

## 2024-01-30 DIAGNOSIS — J189 Pneumonia, unspecified organism: Secondary | ICD-10-CM | POA: Diagnosis not present

## 2024-01-30 DIAGNOSIS — E039 Hypothyroidism, unspecified: Secondary | ICD-10-CM | POA: Diagnosis not present

## 2024-01-30 DIAGNOSIS — N179 Acute kidney failure, unspecified: Secondary | ICD-10-CM | POA: Diagnosis not present

## 2024-01-30 DIAGNOSIS — N10 Acute tubulo-interstitial nephritis: Secondary | ICD-10-CM | POA: Diagnosis not present

## 2024-01-30 LAB — LEGIONELLA PNEUMOPHILA SEROGP 1 UR AG: L. pneumophila Serogp 1 Ur Ag: NEGATIVE

## 2024-01-30 NOTE — Evaluation (Signed)
 Physical Therapy Evaluation Patient Details Name: Patricia Jackson MRN: [id number] DOB: September 29, 1940 Today's Date: 01/30/2024  History of Present Illness  Patricia Jackson is a 83 y.o. female with medical history significant of hypertension, hyperlipidemia, hypothyroidism, atrial fibrillation on chronic anticoagulation, COPD,  chronic kidney disease, anemia of chronic kidney disease and chronic GI bleed who presents to the emergency department with report of hemoglobin of 4.1 from her PCP.  She complained of about 10-day onset of right flank pain with extension of the pain into the right groin, she also complained of cough which started about the same time and has been worsening in the last few days.  Cough was productive, but was unable to tell me the color of the phlegm/sputum, cough this morning resulted in posttussive vomiting x 1.  She complained of several months of black bowel movements   Clinical Impression  Patient functioning neat baseline for functional mobility and gait other than limited for gait training mostly due to mild SOB with SpO2 dropping from 93% to 85% during ambulation, but once seated SpO2 increased to 93% - nurse notified. Patient tolerated sitting up in chair after therapy. Patient will benefit from continued skilled physical therapy in hospital and recommended venue below to increase strength, balance, endurance for safe ADLs and gait.           If plan is discharge home, recommend the following: A little help with walking and/or transfers;Help with stairs or ramp for entrance;Assistance with cooking/housework   Can travel by private vehicle        Equipment Recommendations None recommended by PT  Recommendations for Other Services       Functional Status Assessment Patient has had a recent decline in their functional status and/or demonstrates limited ability to make significant improvements in function in a reasonable and predictable amount of time      Precautions / Restrictions Precautions Precautions: Fall Recall of Precautions/Restrictions: Intact Restrictions Weight Bearing Restrictions Per Provider Order: No      Mobility  Bed Mobility Overal bed mobility: Modified Independent                  Transfers Overall transfer level: Needs assistance Equipment used: Rolling walker (2 wheels) Transfers: Sit to/from Stand, Bed to chair/wheelchair/BSC Sit to Stand: Supervision   Step pivot transfers: Supervision       General transfer comment: slightly labored movement    Ambulation/Gait Ambulation/Gait assistance: Contact guard assist Gait Distance (Feet): 50 Feet Assistive device: Rolling walker (2 wheels) Gait Pattern/deviations: Decreased step length - left, Decreased stance time - right, Decreased stride length Gait velocity: decreased     General Gait Details: slow labored movement without loss of balance, limited mostly due to mild SOB with SpO2 at 85-91%  Stairs            Wheelchair Mobility     Tilt Bed    Modified Rankin (Stroke Patients Only)       Balance Overall balance assessment: Needs assistance Sitting-balance support: Feet supported, No upper extremity supported Sitting balance-Leahy Scale: Good Sitting balance - Comments: seated at EOB   Standing balance support: Bilateral upper extremity supported, During functional activity Standing balance-Leahy Scale: Fair Standing balance comment: fair/good using RW                             Pertinent Vitals/Pain Pain Assessment Pain Assessment: 0-10 Pain Score: 6  Pain Location: across right side to low  back Pain Descriptors / Indicators: Discomfort, Sore Pain Intervention(s): Limited activity within patient's tolerance, Monitored during session, Repositioned    Home Living Family/patient expects to be discharged to:: Private residence Living Arrangements: Children Available Help at Discharge: Family;Available 24  hours/day Type of Home: House Home Access: Stairs to enter Entrance Stairs-Rails: Right Entrance Stairs-Number of Steps: 1   Home Layout: One level Home Equipment: Rollator (4 wheels);Tub bench Additional Comments: Patient confirms no change in living situation since last admission    Prior Function Prior Level of Function : Needs assist       Physical Assist : ADLs (physical);Mobility (physical)     Mobility Comments: household and short distanced community ambulation using Rollator ADLs Comments: reports independence with ADLs, family assist with IADLs     Extremity/Trunk Assessment   Upper Extremity Assessment Upper Extremity Assessment: Defer to OT evaluation    Lower Extremity Assessment Lower Extremity Assessment: Generalized weakness    Cervical / Trunk Assessment Cervical / Trunk Assessment: Normal  Communication   Communication Communication: Impaired Factors Affecting Communication: Hearing impaired    Cognition Arousal: Alert Behavior During Therapy: WFL for tasks assessed/performed   PT - Cognitive impairments: No apparent impairments                         Following commands: Intact       Cueing       General Comments      Exercises     Assessment/Plan    PT Assessment Patient needs continued PT services  PT Problem List Decreased strength;Decreased activity tolerance;Decreased balance;Decreased mobility       PT Treatment Interventions DME instruction;Gait training;Stair training;Functional mobility training;Therapeutic activities;Therapeutic exercise;Balance training;Patient/family education    PT Goals (Current goals can be found in the Care Plan section)  Acute Rehab PT Goals Patient Stated Goal: return home with family to assist PT Goal Formulation: With patient Time For Goal Achievement: 02/03/24 Potential to Achieve Goals: Good    Frequency Min 3X/week     Co-evaluation               AM-PAC PT 6  Clicks Mobility  Outcome Measure Help needed turning from your back to your side while in a flat bed without using bedrails?: None Help needed moving from lying on your back to sitting on the side of a flat bed without using bedrails?: None Help needed moving to and from a bed to a chair (including a wheelchair)?: A Little Help needed standing up from a chair using your arms (e.g., wheelchair or bedside chair)?: A Little Help needed to walk in hospital room?: A Little Help needed climbing 3-5 steps with a railing? : A Lot 6 Click Score: 19    End of Session   Activity Tolerance: Patient tolerated treatment well;Patient limited by fatigue Patient left: in chair;with call bell/phone within reach Nurse Communication: Mobility status PT Visit Diagnosis: Unsteadiness on feet (R26.81);Other abnormalities of gait and mobility (R26.89);Muscle weakness (generalized) (M62.81)    Time: 8965-8943 PT Time Calculation (min) (ACUTE ONLY): 22 min   Charges:   PT Evaluation $PT Eval Moderate Complexity: 1 Mod PT Treatments $Therapeutic Activity: 8-22 mins PT General Charges $$ ACUTE PT VISIT: 1 Visit         2:09 PM, 01/30/24 Lynwood Music, MPT Physical Therapist with [facility] [phone] office [phone] mobile phone

## 2024-01-30 NOTE — Progress Notes (Signed)
 PROGRESS NOTE   Patricia Jackson  FMW:[id number] DOB: [birth year] DOA: 01/27/2024 PCP: Shona Norleen PEDLAR, MD   Chief Complaint  Patient presents with   Melena   Level of care: Med-Surg  Brief Admission History:  83 y.o. female with medical history significant of hypertension, hyperlipidemia, hypothyroidism, atrial fibrillation on chronic anticoagulation, COPD,  chronic kidney disease, anemia of chronic kidney disease and chronic GI bleed who presents to the emergency department with  10-day onset of right flank pain with extension of the pain into the right groin, she also complained of cough which started about the same time and has been worsening in the last few days.  Cough was productive, but was unable to tell me the color of the phlegm/sputum, cough this morning resulted in posttussive vomiting x 1.  She complained of several months of black bowel movements.  CT abdomen and pelvis without contrast was suggestive of acute pyelonephritis.  She was treated with IV ceftriaxone  and azithromycin , IV hydration was provided,. Chest x-ray showed new  spiculated nodular opacity within the right upper lung zone, possibly infectious or inflammatory in the acute setting.    Assessment and Plan:  Community acquired pneumonia.  Continue with IV ceftriaxone  and doxycycline .  Follow blood cultures and sputum cultures--NGTD Continue with the Mucinex  and flutter valve at this time. Resolved leukocytosis.  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 Creatinine has improved from 1.8 to 1.48.  Bicarb wnl.  Lactic acid wnl.    Acute pyelonephritis/ acute cystitis.  CT abdomen pelvis showing acute pyelonephritis. Continue with IV ceftriaxone . Follow-up urine and blood cultures.-- NGTD Pain control for acute pyelonephritis.   Atrial fibrillation Rate controlled on amiodarone  and patient on Eliquis  for  anticoagulation.    COPD No wheezing heard Continue with DuoNebs.   Anemia of chronic disease Hemoglobin around 9. Normocytic.  Transfuse to keep hemoglobin greater than 7.    Thrombocytopenia Monitor.   DVT prophylaxis: apixaban   Code Status: Full  Family Communication:  Disposition: anticipate DC home tomorrow    Consultants:   Procedures:   Antimicrobials:  Ceftriaxone  10/24>>    Subjective: Pt reporting less SOB but remains fairly weak.     Objective: Vitals:   01/29/24 0407 01/29/24 1225 01/29/24 1923 01/30/24 0344  BP: (!) 110/54 (!) 117/54 (!) 160/68 111/64  Pulse: 65 61 68 63  Resp: 18 20 20 18   Temp: 98.2 F (36.8 C) 98.3 F (36.8 C) 97.9 F (36.6 C) 98 F (36.7 C)  TempSrc: Oral Oral Oral Oral  SpO2: 94% 97% 97% 96%  Weight:      Height:        Intake/Output Summary (Last 24 hours) at 01/30/2024 1253 Last data filed at 01/30/2024 0800 Gross per 24 hour  Intake 681.42 ml  Output --  Net 681.42 ml   Filed Weights   01/27/24 1701  Weight: 59 kg   Examination:  General exam: Appears calm and comfortable  Respiratory system: Clear to auscultation. Respiratory effort normal. Cardiovascular system: normal S1 & S2 heard. No JVD, murmurs, rubs, gallops or clicks. No pedal edema. Gastrointestinal system: Abdomen is nondistended, soft and nontender. No organomegaly or masses felt. Normal bowel sounds heard. Central nervous system: Alert and oriented. No focal neurological deficits. Extremities: Symmetric 5 x 5 power. Skin: No rashes, lesions or ulcers. Psychiatry: Judgement and insight appear normal. Mood & affect appropriate.   Data Reviewed:  I have personally reviewed following labs and imaging studies  CBC: Recent Labs  Lab 01/27/24 1700 01/28/24 0320 01/29/24 0408  WBC 13.3* 11.4* 7.9  NEUTROABS 11.5*  --  6.3  HGB 11.1* 9.2* 8.8*  HCT 34.7* 29.6* 28.1*  MCV 97.2 99.0 97.6  PLT 111* 88* 84*    Basic Metabolic Panel: Recent Labs   Lab 01/27/24 1700 01/28/24 0320 01/29/24 0408  NA 136 135 133*  K 3.7 3.8 4.2  CL 101 100 101  CO2 21* 24 24  GLUCOSE 100* 76 90  BUN 30* 26* 32*  CREATININE 1.89* 1.67* 1.48*  CALCIUM  9.0 8.2* 8.3*  MG  --  1.9  --   PHOS  --  4.0  --     CBG: No results for input(s): GLUCAP in the last 168 hours.  Recent Results (from the past 240 hours)  Urine Culture     Status: Abnormal   Collection Time: 01/27/24  9:05 PM   Specimen: Urine, Random  Result Value Ref Range Status   Specimen Description   Final    URINE, RANDOM Performed at [facility] [address], [place], [place] [address]    Special Requests   Final    NONE Reflexed from [id number] Performed at [facility], [address] [place], [place] [address]    Culture (A)  Final    <10,000 COLONIES/mL INSIGNIFICANT GROWTH Performed at [facility], [address], [place], [place] [address]    Report Status 01/29/2024 FINAL  Final  Resp panel by RT-PCR (RSV, Flu A&B, Covid) Anterior Nasal Swab     Status: None   Collection Time: 01/27/24 10:07 PM   Specimen: Anterior Nasal Swab  Result Value Ref Range Status   SARS Coronavirus 2 by RT PCR NEGATIVE NEGATIVE Final    Comment: (NOTE) SARS-CoV-2 target nucleic acids are NOT DETECTED.  The SARS-CoV-2 RNA is generally detectable in upper respiratory specimens during the acute phase of infection. The lowest concentration of SARS-CoV-2 viral copies this assay can detect is 138 copies/mL. A negative result does not preclude SARS-Cov-2 infection and should not be used as the sole basis for treatment or other patient management decisions. A negative result may occur with  improper specimen collection/handling, submission of specimen other than nasopharyngeal swab, presence of viral mutation(s) within the areas targeted by this assay, and inadequate number of viral copies(<138 copies/mL). A negative result must be combined with clinical observations, patient  history, and epidemiological information. The expected result is Negative.  Fact Sheet for Patients:  bloggercourse.com  Fact Sheet for Healthcare Providers:  seriousbroker.it  This test is no t yet approved or cleared by the [place]  FDA and  has been authorized for detection and/or diagnosis of SARS-CoV-2 by FDA under an Emergency Use Authorization (EUA). This EUA will remain  in effect (meaning this test can be used) for the duration of the COVID-19 declaration under Section 564(b)(1) of the Act, 21 U.S.C.section 360bbb-3(b)(1), unless the authorization is terminated  or revoked sooner.       Influenza A by PCR NEGATIVE NEGATIVE Final   Influenza B by PCR NEGATIVE NEGATIVE Final    Comment: (NOTE) The Xpert Xpress SARS-CoV-2/FLU/RSV plus assay is intended as an aid in the diagnosis of influenza from Nasopharyngeal swab specimens and should not be used as a sole basis for treatment. Nasal washings and aspirates are unacceptable for Xpert Xpress SARS-CoV-2/FLU/RSV testing.  Fact Sheet for Patients: bloggercourse.com  Fact Sheet for Healthcare  Providers: seriousbroker.it  This test is not yet approved or cleared by the [place]  FDA and has been authorized for detection and/or diagnosis of SARS-CoV-2 by FDA under an Emergency Use Authorization (EUA). This EUA will remain in effect (meaning this test can be used) for the duration of the COVID-19 declaration under Section 564(b)(1) of the Act, 21 U.S.C. section 360bbb-3(b)(1), unless the authorization is terminated or revoked.     Resp Syncytial Virus by PCR NEGATIVE NEGATIVE Final    Comment: (NOTE) Fact Sheet for Patients: bloggercourse.com  Fact Sheet for Healthcare Providers: seriousbroker.it  This test is not yet approved or cleared by the [place]  FDA  and has been authorized for detection and/or diagnosis of SARS-CoV-2 by FDA under an Emergency Use Authorization (EUA). This EUA will remain in effect (meaning this test can be used) for the duration of the COVID-19 declaration under Section 564(b)(1) of the Act, 21 U.S.C. section 360bbb-3(b)(1), unless the authorization is terminated or revoked.  Performed at [facility], [address], [place], [place] [address]   Blood culture (routine x 2)     Status: None (Preliminary result)   Collection Time: 01/27/24 10:50 PM   Specimen: BLOOD RIGHT HAND  Result Value Ref Range Status   Specimen Description BLOOD RIGHT HAND  Final   Special Requests   Final    BOTTLES DRAWN AEROBIC AND ANAEROBIC Blood Culture adequate volume   Culture   Final    NO GROWTH 3 DAYS Performed at [facility], [address] [place], [place] [address]    Report Status PENDING  Incomplete  Blood culture (routine x 2)     Status: None (Preliminary result)   Collection Time: 01/27/24 11:00 PM   Specimen: BLOOD RIGHT HAND  Result Value Ref Range Status   Specimen Description BLOOD RIGHT HAND  Final   Special Requests   Final    BOTTLES DRAWN AEROBIC AND ANAEROBIC Blood Culture adequate volume   Culture   Final    NO GROWTH 3 DAYS Performed at [facility], [address] [place], [place] [address]    Report Status PENDING  Incomplete     Radiology Studies: No results found.   Scheduled Meds:  amiodarone   200 mg Oral Daily   apixaban   2.5 mg Oral BID   doxycycline   100 mg Oral Q12H   feeding supplement  237 mL Oral BID BM   levothyroxine   75 mcg Oral QAC breakfast   rosuvastatin   20 mg Oral Daily   traZODone   50 mg Oral QHS   Continuous Infusions:  cefTRIAXone  (ROCEPHIN )  IV 1 g (01/29/24 2027)     LOS: 3 days   Time spent: 55 mins  Jmari Pelc Vicci, MD How to contact the [facility] Attending or Consulting provider 7A - 7P or covering provider during after hours 7P -7A, for this patient?  Check the  care team in [facility] and look for a) attending/consulting [facility] provider listed and b) the [facility] team listed Log into [email or web address] to find provider on call.  Locate the [facility] provider you are looking for under [facility] and page to a number that you can be directly reached. If you still have difficulty reaching the provider, please page the [facility] (Director on Call) for the Hospitalists listed on amion for assistance.  01/30/2024, 12:53 PM

## 2024-01-30 NOTE — Plan of Care (Signed)
  Problem: Acute Rehab PT Goals(only PT should resolve) Goal: Pt Will Go Supine/Side To Sit Flowsheets (Taken 01/30/2024 1410) Pt will go Supine/Side to Sit: Independently Goal: Patient Will Transfer Sit To/From Stand Flowsheets (Taken 01/30/2024 1410) Patient will transfer sit to/from stand: with modified independence Goal: Pt Will Transfer Bed To Chair/Chair To Bed Flowsheets (Taken 01/30/2024 1410) Pt will Transfer Bed to Chair/Chair to Bed: with modified independence Goal: Pt Will Ambulate Flowsheets (Taken 01/30/2024 1410) Pt will Ambulate:  100 feet  with modified independence  with supervision  with rolling walker  2:12 PM, 01/30/24 Lynwood Music, MPT Physical Therapist with [facility] [phone] office [phone] mobile phone

## 2024-01-30 NOTE — Plan of Care (Signed)

## 2024-01-30 NOTE — Plan of Care (Signed)
   Problem: Activity: Goal: Risk for activity intolerance will decrease Outcome: Progressing   Problem: Coping: Goal: Level of anxiety will decrease Outcome: Progressing

## 2024-01-31 DIAGNOSIS — I480 Paroxysmal atrial fibrillation: Secondary | ICD-10-CM | POA: Diagnosis not present

## 2024-01-31 DIAGNOSIS — N10 Acute tubulo-interstitial nephritis: Secondary | ICD-10-CM | POA: Diagnosis not present

## 2024-01-31 DIAGNOSIS — J449 Chronic obstructive pulmonary disease, unspecified: Secondary | ICD-10-CM | POA: Diagnosis not present

## 2024-01-31 DIAGNOSIS — I10 Essential (primary) hypertension: Secondary | ICD-10-CM | POA: Diagnosis not present

## 2024-01-31 LAB — BASIC METABOLIC PANEL WITH GFR
Anion gap: 7 (ref 5–15)
BUN: 21 mg/dL (ref 8–23)
CO2: 27 mmol/L (ref 22–32)
Calcium: 8.6 mg/dL — ABNORMAL LOW (ref 8.9–10.3)
Chloride: 104 mmol/L (ref 98–111)
Creatinine, Ser: 1.12 mg/dL — ABNORMAL HIGH (ref 0.44–1.00)
GFR, Estimated: 49 mL/min — ABNORMAL LOW (ref >60)
Glucose, Bld: 99 mg/dL (ref 70–99)
Potassium: 4.3 mmol/L (ref 3.5–5.1)
Sodium: 137 mmol/L (ref 135–145)

## 2024-01-31 MED ORDER — CEFUROXIME AXETIL 500 MG PO TABS: 500.0000 mg | ORAL_TABLET | Freq: Two times a day (BID) | ORAL | 0 refills | Status: AC

## 2024-01-31 MED ORDER — AMIODARONE HCL 200 MG PO TABS: 200.0000 mg | ORAL_TABLET | Freq: Every day | ORAL | Status: DC

## 2024-01-31 MED ORDER — NITROGLYCERIN 0.4 MG SL SUBL: 0.4000 mg | SUBLINGUAL_TABLET | SUBLINGUAL | 2 refills | Status: AC | PRN

## 2024-01-31 NOTE — Discharge Instructions (Signed)
 IMPORTANT INFORMATION: PAY CLOSE ATTENTION   PHYSICIAN DISCHARGE INSTRUCTIONS  Follow with Primary care provider  Patricia Stabile, MD  and other consultants as instructed by your Hospitalist Physician  SEEK MEDICAL CARE OR RETURN TO EMERGENCY ROOM IF SYMPTOMS COME BACK, WORSEN OR NEW PROBLEM DEVELOPS   Please note: You were cared for by a hospitalist during your hospital stay. Every effort will be made to forward records to your primary care provider.  You can request that your primary care provider send for your hospital records if they have not received them.  Once you are discharged, your primary care physician will handle any further medical issues. Please note that NO REFILLS for any discharge medications will be authorized once you are discharged, as it is imperative that you return to your primary care physician (or establish a relationship with a primary care physician if you do not have one) for your post hospital discharge needs so that they can reassess your need for medications and monitor your lab values.  Please get a complete blood count and chemistry panel checked by your Primary MD at your next visit, and again as instructed by your Primary MD.  Get Medicines reviewed and adjusted: Please take all your medications with you for your next visit with your Primary MD  Laboratory/radiological data: Please request your Primary MD to go over all hospital tests and procedure/radiological results at the follow up, please ask your primary care provider to get all Hospital records sent to his/her office.  In some cases, they will be blood work, cultures and biopsy results pending at the time of your discharge. Please request that your primary care provider follow up on these results.  If you are diabetic, please bring your blood sugar readings with you to your follow up appointment with primary care.    Please call and make your follow up appointments as soon as possible.    Also Note the  following: If you experience worsening of your admission symptoms, develop shortness of breath, life threatening emergency, suicidal or homicidal thoughts you must seek medical attention immediately by calling 911 or calling your MD immediately  if symptoms less severe.  You must read complete instructions/literature along with all the possible adverse reactions/side effects for all the Medicines you take and that have been prescribed to you. Take any new Medicines after you have completely understood and accpet all the possible adverse reactions/side effects.   Do not drive when taking Pain medications or sleeping medications (Benzodiazepines)  Do not take more than prescribed Pain, Sleep and Anxiety Medications. It is not advisable to combine anxiety,sleep and pain medications without talking with your primary care practitioner  Special Instructions: If you have smoked or chewed Tobacco  in the last 2 yrs please stop smoking, stop any regular Alcohol  and or any Recreational drug use.  Wear Seat belts while driving.  Do not drive if taking any narcotic, mind altering or controlled substances or recreational drugs or alcohol.

## 2024-01-31 NOTE — Discharge Summary (Signed)
 Physician Discharge Summary  Patricia Jackson FMW:[id number] DOB: 24-Jan-1941 DOA: 01/27/2024  PCP: Shona Norleen PEDLAR, MD  Admit date: 01/27/2024 Discharge date: 01/31/2024  Admitted From:  HOME  Disposition: HOME   Recommendations for Outpatient Follow-up:  Follow up with PCP in 1 weeks  Discharge Condition: STABLE   CODE STATUS: FULL DIET: resume prior home diet    Brief Hospitalization Summary: Please see all hospital notes, images, labs for full details of the hospitalization. Admission provider HPI:  83 y.o. female with medical history significant of hypertension, hyperlipidemia, hypothyroidism, atrial fibrillation on chronic anticoagulation, COPD,  chronic kidney disease, anemia of chronic kidney disease and chronic GI bleed who presents to the emergency department with  10-day onset of right flank pain with extension of the pain into the right groin, she also complained of cough which started about the same time and has been worsening in the last few days.  Cough was productive, but was unable to tell me the color of the phlegm/sputum, cough this morning resulted in posttussive vomiting x 1.  She complained of several months of black bowel movements.  CT abdomen and pelvis without contrast was suggestive of acute pyelonephritis.  She was treated with IV ceftriaxone  and azithromycin , IV hydration was provided,. Chest x-ray showed new  spiculated nodular opacity within the right upper lung zone, possibly infectious or inflammatory in the acute setting.    Hospital course by listed problems addressed   Community acquired pneumonia.  Pt was treated with IV ceftriaxone  and doxycycline .  Follow blood cultures and sputum cultures were followed  Pt was treated with the Mucinex  and flutter valve at this time. Resolved leukocytosis.  plan DC home today Complete 2 more days of oral cefuroxime to complete course     Acute kidney injury superimposed on stage IV CKD Probably secondary to  dehydration. Creatinine has improved from 1.8 to 1.48.  Bicarb wnl.  Lactic acid wnl.    Acute pyelonephritis/ acute cystitis.  CT abdomen pelvis showing acute pyelonephritis. Treated with IV ceftriaxone . Follow-up urine and blood cultures.-- indeterminate DC on oral cefuroxime for 2 more days to complete course   Atrial fibrillation Rate controlled on amiodarone  and patient on Eliquis  for anticoagulation.    COPD No wheezing heard Continue with DuoNebs.   Anemia of chronic disease Hemoglobin around 9. Normocytic.    Thrombocytopenia Monitor.    Discharge Diagnoses:  Principal Problem:   Acute pyelonephritis Active Problems:   Essential hypertension   Mixed hyperlipidemia   Paroxysmal atrial fibrillation (HCC)   Acute kidney injury superimposed on stage 4 chronic kidney disease (HCC)   Chronic obstructive pulmonary disease (HCC)   Depression   Acquired hypothyroidism   CAP (community acquired pneumonia)   Hypoalbuminemia due to protein-calorie malnutrition   Discharge Instructions:  Allergies as of 01/31/2024       Reactions   Amoxicillin -pot Clavulanate Diarrhea   Povidone-iodine Other (See Comments)   Burning   Sulfa Antibiotics Other (See Comments)   Unknown    Sulfamethoxazole Other (See Comments)   Unknown         Medication List     STOP taking these medications    doxycycline  100 MG tablet Commonly known as: VIBRA -TABS   nitrofurantoin (macrocrystal-monohydrate) 100 MG capsule Commonly known as: MACROBID       TAKE these medications    acetaminophen  500 MG tablet Commonly known as: TYLENOL  Take 1,000 mg by mouth every 8 (eight) hours as needed for mild pain or moderate pain.  albuterol  (2.5 MG/3ML) 0.083% nebulizer solution Commonly known as: PROVENTIL  Take 2.5 mg by nebulization every 6 (six) hours as needed for shortness of breath or wheezing.   albuterol  108 (90 Base) MCG/ACT inhaler Commonly known as: VENTOLIN  HFA Inhale 2  puffs into the lungs every 4 (four) hours as needed for wheezing or shortness of breath.   amiodarone  200 MG tablet Commonly known as: PACERONE  Take 1 tablet (200 mg total) by mouth daily. Take 1 Tablet ( 200 mg ) Daily and none on Sunday   amLODipine  5 MG tablet Commonly known as: NORVASC  Take 5 mg by mouth daily.   apixaban  2.5 MG Tabs tablet Commonly known as: Eliquis  Take 1 tablet (2.5 mg total) by mouth 2 (two) times daily.   cefUROXime 500 MG tablet Commonly known as: CEFTIN Take 1 tablet (500 mg total) by mouth 2 (two) times daily with a meal for 2 days.   ferrous sulfate  324 MG Tbec Take 324 mg by mouth daily with breakfast.   levothyroxine  75 MCG tablet Commonly known as: SYNTHROID  Take 75 mcg by mouth daily before breakfast.   losartan 25 MG tablet Commonly known as: COZAAR Take 25 mg by mouth at bedtime.   lubiprostone  24 MCG capsule Commonly known as: AMITIZA  Take 1 capsule (24 mcg total) by mouth 2 (two) times daily with a meal.   nitroGLYCERIN  0.4 MG SL tablet Commonly known as: NITROSTAT  Place 1 tablet (0.4 mg total) under the tongue every 5 (five) minutes as needed for chest pain.   pantoprazole  40 MG tablet Commonly known as: Protonix  Take 1 tablet (40 mg total) by mouth 2 (two) times daily.   prednisoLONE  acetate 1 % ophthalmic suspension Commonly known as: PRED FORTE  Place 1 drop into the right eye See admin instructions. Place 1 drop in right eye 4 times daily for 1 week, 3 times daily for 1 week, 2 times daily for 1 week, then 1 time daily for 1 week, then stop.   rosuvastatin  20 MG tablet Commonly known as: CRESTOR  Take 20 mg by mouth daily.   timolol 0.5 % ophthalmic solution Commonly known as: TIMOPTIC Place 1 drop into the right eye 2 (two) times daily.   traZODone  50 MG tablet Commonly known as: DESYREL  Take 50 mg by mouth at bedtime.   Trelegy Ellipta 200-62.5-25 MCG/ACT Aepb Generic drug: Fluticasone -Umeclidin-Vilant Inhale 1 puff  into the lungs daily.        Follow-up Information     Shona Norleen PEDLAR, MD Follow up in 1 week(s).   Specialty: Internal Medicine Why: Hospital Follow Up Contact information: [address] [place] [facility] [address] [phone]                Allergies  Allergen Reactions   Amoxicillin -Pot Clavulanate Diarrhea   Povidone-Iodine Other (See Comments)    Burning   Sulfa Antibiotics Other (See Comments)    Unknown    Sulfamethoxazole Other (See Comments)    Unknown    Allergies as of 01/31/2024       Reactions   Amoxicillin -pot Clavulanate Diarrhea   Povidone-iodine Other (See Comments)   Burning   Sulfa Antibiotics Other (See Comments)   Unknown    Sulfamethoxazole Other (See Comments)   Unknown         Medication List     STOP taking these medications    doxycycline  100 MG tablet Commonly known as: VIBRA -TABS   nitrofurantoin (macrocrystal-monohydrate) 100 MG capsule Commonly known as: MACROBID  TAKE these medications    acetaminophen  500 MG tablet Commonly known as: TYLENOL  Take 1,000 mg by mouth every 8 (eight) hours as needed for mild pain or moderate pain.   albuterol  (2.5 MG/3ML) 0.083% nebulizer solution Commonly known as: PROVENTIL  Take 2.5 mg by nebulization every 6 (six) hours as needed for shortness of breath or wheezing.   albuterol  108 (90 Base) MCG/ACT inhaler Commonly known as: VENTOLIN  HFA Inhale 2 puffs into the lungs every 4 (four) hours as needed for wheezing or shortness of breath.   amiodarone  200 MG tablet Commonly known as: PACERONE  Take 1 tablet (200 mg total) by mouth daily. Take 1 Tablet ( 200 mg ) Daily and none on Sunday   amLODipine  5 MG tablet Commonly known as: NORVASC  Take 5 mg by mouth daily.   apixaban  2.5 MG Tabs tablet Commonly known as: Eliquis  Take 1 tablet (2.5 mg total) by mouth 2 (two) times daily.   cefUROXime 500 MG tablet Commonly known as: CEFTIN Take 1 tablet (500 mg total) by mouth  2 (two) times daily with a meal for 2 days.   ferrous sulfate  324 MG Tbec Take 324 mg by mouth daily with breakfast.   levothyroxine  75 MCG tablet Commonly known as: SYNTHROID  Take 75 mcg by mouth daily before breakfast.   losartan 25 MG tablet Commonly known as: COZAAR Take 25 mg by mouth at bedtime.   lubiprostone  24 MCG capsule Commonly known as: AMITIZA  Take 1 capsule (24 mcg total) by mouth 2 (two) times daily with a meal.   nitroGLYCERIN  0.4 MG SL tablet Commonly known as: NITROSTAT  Place 1 tablet (0.4 mg total) under the tongue every 5 (five) minutes as needed for chest pain.   pantoprazole  40 MG tablet Commonly known as: Protonix  Take 1 tablet (40 mg total) by mouth 2 (two) times daily.   prednisoLONE  acetate 1 % ophthalmic suspension Commonly known as: PRED FORTE  Place 1 drop into the right eye See admin instructions. Place 1 drop in right eye 4 times daily for 1 week, 3 times daily for 1 week, 2 times daily for 1 week, then 1 time daily for 1 week, then stop.   rosuvastatin  20 MG tablet Commonly known as: CRESTOR  Take 20 mg by mouth daily.   timolol 0.5 % ophthalmic solution Commonly known as: TIMOPTIC Place 1 drop into the right eye 2 (two) times daily.   traZODone  50 MG tablet Commonly known as: DESYREL  Take 50 mg by mouth at bedtime.   Trelegy Ellipta 200-62.5-25 MCG/ACT Aepb Generic drug: Fluticasone -Umeclidin-Vilant Inhale 1 puff into the lungs daily.        Procedures/Studies: DG Chest Portable 1 View Result Date: 01/27/2024 EXAM: 1 VIEW XRAY OF THE CHEST 01/27/2024 10:05:33 PM COMPARISON: None available. CLINICAL HISTORY: Cough. Pt to er room number one, pt states that her md called her and told her that her heme was 4.1, states that this will be her 4th or 5th transfusion for lower h/h. Pt states that she is having black and tarry bm. FINDINGS: LUNGS AND PLEURA: The lungs are symmetrically hyperinflated in keeping with changes of underlying COPD.  Chronic interstitial thickening. New spiculated nodular opacity within the right upper lung zone, possibly infectious or inflammatory and acute setting. No pleural effusion. HEART AND MEDIASTINUM: Atherosclerotic calcification within the thoracic aorta. Cardiac size within normal limits. BONES AND SOFT TISSUES: Surgical clips within the left axilla. No acute bony abnormality. IMPRESSION: 1. New spiculated nodular opacity within the right upper lung zone,  possibly infectious or inflammatory in the acute setting. Follow-up chest radiograph is recommended in 3-4 weeks, following conservative therapy, to document resolution. If persistent at that time, dedicated contrast-enhanced CT imaging of the chest is recommended for further evaluation. 2. COPD Electronically signed by: Dorethia Molt MD 01/27/2024 10:10 PM EDT RP Workstation: [id number]   CT ABDOMEN PELVIS WO CONTRAST Result Date: 01/27/2024 EXAM: CT ABDOMEN AND PELVIS WITHOUT CONTRAST 01/27/2024 08:34:47 PM TECHNIQUE: CT of the abdomen and pelvis was performed without the administration of intravenous contrast. Multiplanar reformatted images are provided for review. Automated exposure control, iterative reconstruction, and/or weight-based adjustment of the mA/kV was utilized to reduce the radiation dose to as low as reasonably achievable. COMPARISON: 08/23/2021 CLINICAL HISTORY: Abdominal pain, acute, nonlocalized; gen weakness, abd pain, AKI. General abd pain, weakness, DKI.; Pt to er room number one, pt states that her md called her and told her that her heme was 4.1, states that this will be her 4th or 5th transfusion for lower h/h. Pt states that she is having black and tarry bm. FINDINGS: LIMITATIONS/ARTIFACTS: Severely motion-degraded images, limiting evaluation. LOWER CHEST: No acute abnormality. LIVER: The liver is unremarkable. GALLBLADDER AND BILE DUCTS: Gallbladder is unremarkable. No biliary ductal dilatation. SPLEEN: No acute abnormality.  PANCREAS: No acute abnormality. ADRENAL GLANDS: No acute abnormality. KIDNEYS, URETERS AND BLADDER: 1.8 cm left renal cyst (image 19), benign (Bosniak I), no follow up recommended. Stable fullness of the right renal pelvis, favoring extrarenal pelvis over hydronephrosis. Mild right perinephric stranding (image 36), new, suggesting the possibility of pyelonephritis. No stones in the kidneys or ureters. Urinary bladder is unremarkable. GI AND BOWEL: Stomach demonstrates no acute abnormality. Multiple diverticula mainly in the left hemicolon, without signs of diverticulitis. There is no bowel obstruction. PERITONEUM AND RETROPERITONEUM: No ascites. No free air. VASCULATURE: Aorta is normal in caliber. Atherosclerotic calcifications of the abdominal aorta and branch vessels. LYMPH NODES: No lymphadenopathy. REPRODUCTIVE ORGANS: No acute abnormality. BONES AND SOFT TISSUES: No acute osseous abnormality. No focal soft tissue abnormality. IMPRESSION: 1. Severely motion-degraded exam, limiting evaluation. 2. Mild right perinephric stranding, new, suspicious for acute pyelonephritis. 3. Otherwise negative. Electronically signed by: Pinkie Pebbles MD 01/27/2024 08:40 PM EDT RP Workstation: [id number]   DG Abd 1 View Result Date: 01/26/2024 CLINICAL DATA:  pain, right lower quadrant abdominal pain EXAM: ABDOMEN - 1 VIEW COMPARISON:  September 08, 2023 FINDINGS: Nonobstructive bowel gas pattern.No pneumoperitoneum. No organomegaly or radiopaque calculi. No acute fracture or destructive lesion. Multilevel thoracic osteophytosis. IMPRESSION: Nonobstructive bowel gas pattern.  No radiopaque calculi visualized. Electronically Signed   By: Rogelia Myers M.D.   On: 01/26/2024 12:56   US  RENAL Result Date: 01/26/2024 CLINICAL DATA:  RLQ ab pain/+3 blood in urine EXAM: RENAL / URINARY TRACT ULTRASOUND COMPLETE COMPARISON:  08/23/2021, 01/25/2023 FINDINGS: Right Kidney: Renal measurements: 9.3 x 4.1 x 3.8 cm = volume: 74 mL.Normal  echogenicity. Interpolar region cyst measuring 1.1 x 1 x 1 cm. No hydronephrosis or nephrolithiasis. Left Kidney: Renal measurements: 9.7 x 4.4 x 3.9 cm = volume: 88 mL. Normal echogenicity. Upper pole cyst measuring 2 x 2.1 x 1.8 cm. A smaller upper pole cyst is also present measuring 1.1 x 1.2 x 1.2 cm. No hydronephrosis or nephrolithiasis. Bladder: Circumferential wall thickening of the urinary bladder. Other: None. IMPRESSION: 1. No hydronephrosis or nephrolithiasis. 2. Circumferential wall thickening of the urinary bladder, which may be due to underdistension. If there is concern for acute cystitis, correlation with urinalysis would be recommended. Electronically Signed  By: Rogelia Myers M.D.   On: 01/26/2024 12:45     Subjective: Pt says overall she is feeling a lot better and able to ambulate well in room, she feels she can manage well at home.   Discharge Exam: Vitals:   01/30/24 2011 01/31/24 0516  BP: (!) 128/53 (!) 150/72  Pulse: 69 67  Resp: 16 18  Temp: 98.5 F (36.9 C) 97.6 F (36.4 C)  SpO2: 92% 92%   Vitals:   01/30/24 0344 01/30/24 1400 01/30/24 2011 01/31/24 0516  BP: 111/64 131/63 (!) 128/53 (!) 150/72  Pulse: 63 67 69 67  Resp: 18 18 16 18   Temp: 98 F (36.7 C) 98.2 F (36.8 C) 98.5 F (36.9 C) 97.6 F (36.4 C)  TempSrc: Oral  Oral Axillary  SpO2: 96% 96% 92% 92%  Weight:      Height:       General: Pt is alert, awake, not in acute distress Cardiovascular: normal S1/S2 +, no rubs, no gallops Respiratory: CTA bilaterally, no wheezing, no rhonchi Abdominal: Soft, NT, ND, bowel sounds + Extremities: no edema, no cyanosis   The results of significant diagnostics from this hospitalization (including imaging, microbiology, ancillary and laboratory) are listed below for reference.     Microbiology: Recent Results (from the past 240 hours)  Urine Culture     Status: Abnormal   Collection Time: 01/27/24  9:05 PM   Specimen: Urine, Random  Result Value Ref  Range Status   Specimen Description   Final    URINE, RANDOM Performed at [facility], [address] [place], [place] [address]    Special Requests   Final    NONE Reflexed from [id number] Performed at [facility], [address] [place], [place] [address]    Culture (A)  Final    <10,000 COLONIES/mL INSIGNIFICANT GROWTH Performed at [facility], [address] [place], [place] [address]    Report Status 01/29/2024 FINAL  Final  Resp panel by RT-PCR (RSV, Flu A&B, Covid) Anterior Nasal Swab     Status: None   Collection Time: 01/27/24 10:07 PM   Specimen: Anterior Nasal Swab  Result Value Ref Range Status   SARS Coronavirus 2 by RT PCR NEGATIVE NEGATIVE Final    Comment: (NOTE) SARS-CoV-2 target nucleic acids are NOT DETECTED.  The SARS-CoV-2 RNA is generally detectable in upper respiratory specimens during the acute phase of infection. The lowest concentration of SARS-CoV-2 viral copies this assay can detect is 138 copies/mL. A negative result does not preclude SARS-Cov-2 infection and should not be used as the sole basis for treatment or other patient management decisions. A negative result may occur with  improper specimen collection/handling, submission of specimen other than nasopharyngeal swab, presence of viral mutation(s) within the areas targeted by this assay, and inadequate number of viral copies(<138 copies/mL). A negative result must be combined with clinical observations, patient history, and epidemiological information. The expected result is Negative.  Fact Sheet for Patients:  bloggercourse.com  Fact Sheet for Healthcare Providers:  seriousbroker.it  This test is no t yet approved or cleared by the [place]  FDA and  has been authorized for detection and/or diagnosis of SARS-CoV-2 by FDA under an Emergency Use Authorization (EUA). This EUA will remain  in effect (meaning this test can be used) for  the duration of the COVID-19 declaration under Section 564(b)(1) of the Act, 21 U.S.C.section 360bbb-3(b)(1), unless the authorization is terminated  or revoked sooner.  Influenza A by PCR NEGATIVE NEGATIVE Final   Influenza B by PCR NEGATIVE NEGATIVE Final    Comment: (NOTE) The Xpert Xpress SARS-CoV-2/FLU/RSV plus assay is intended as an aid in the diagnosis of influenza from Nasopharyngeal swab specimens and should not be used as a sole basis for treatment. Nasal washings and aspirates are unacceptable for Xpert Xpress SARS-CoV-2/FLU/RSV testing.  Fact Sheet for Patients: bloggercourse.com  Fact Sheet for Healthcare Providers: seriousbroker.it  This test is not yet approved or cleared by the [place]  FDA and has been authorized for detection and/or diagnosis of SARS-CoV-2 by FDA under an Emergency Use Authorization (EUA). This EUA will remain in effect (meaning this test can be used) for the duration of the COVID-19 declaration under Section 564(b)(1) of the Act, 21 U.S.C. section 360bbb-3(b)(1), unless the authorization is terminated or revoked.     Resp Syncytial Virus by PCR NEGATIVE NEGATIVE Final    Comment: (NOTE) Fact Sheet for Patients: bloggercourse.com  Fact Sheet for Healthcare Providers: seriousbroker.it  This test is not yet approved or cleared by the [place]  FDA and has been authorized for detection and/or diagnosis of SARS-CoV-2 by FDA under an Emergency Use Authorization (EUA). This EUA will remain in effect (meaning this test can be used) for the duration of the COVID-19 declaration under Section 564(b)(1) of the Act, 21 U.S.C. section 360bbb-3(b)(1), unless the authorization is terminated or revoked.  Performed at [facility], [address], [place], [place] [address]   Blood culture (routine x 2)     Status: None (Preliminary result)    Collection Time: 01/27/24 10:50 PM   Specimen: BLOOD RIGHT HAND  Result Value Ref Range Status   Specimen Description BLOOD RIGHT HAND  Final   Special Requests   Final    BOTTLES DRAWN AEROBIC AND ANAEROBIC Blood Culture adequate volume   Culture   Final    NO GROWTH 4 DAYS Performed at [facility], [address] [place], [place] [address]    Report Status PENDING  Incomplete  Blood culture (routine x 2)     Status: None (Preliminary result)   Collection Time: 01/27/24 11:00 PM   Specimen: BLOOD RIGHT HAND  Result Value Ref Range Status   Specimen Description BLOOD RIGHT HAND  Final   Special Requests   Final    BOTTLES DRAWN AEROBIC AND ANAEROBIC Blood Culture adequate volume   Culture   Final    NO GROWTH 4 DAYS Performed at [facility], [address] [place], [place] [address]    Report Status PENDING  Incomplete     Labs: BNP (last 3 results) Recent Labs    02/28/23 0909 09/05/23 1046 12/05/23 1422  BNP 59.0 171.0* 151.0*   Basic Metabolic Panel: Recent Labs  Lab 01/27/24 1700 01/28/24 0320 01/29/24 0408 01/31/24 0427  NA 136 135 133* 137  K 3.7 3.8 4.2 4.3  CL 101 100 101 104  CO2 21* 24 24 27   GLUCOSE 100* 76 90 99  BUN 30* 26* 32* 21  CREATININE 1.89* 1.67* 1.48* 1.12*  CALCIUM  9.0 8.2* 8.3* 8.6*  MG  --  1.9  --   --   PHOS  --  4.0  --   --    Liver Function Tests: Recent Labs  Lab 01/27/24 1700 01/28/24 0320  AST 44* 38  ALT 19 14  ALKPHOS 80 69  BILITOT 0.7 0.4  PROT 6.7 5.6*  ALBUMIN  3.4* 2.9*   No results for input(s): LIPASE, AMYLASE  in the last 168 hours. No results for input(s): AMMONIA in the last 168 hours. CBC: Recent Labs  Lab 01/27/24 1700 01/28/24 0320 01/29/24 0408  WBC 13.3* 11.4* 7.9  NEUTROABS 11.5*  --  6.3  HGB 11.1* 9.2* 8.8*  HCT 34.7* 29.6* 28.1*  MCV 97.2 99.0 97.6  PLT 111* 88* 84*   Cardiac Enzymes: No results for input(s): CKTOTAL, CKMB, CKMBINDEX, TROPONINI in the last 168  hours. BNP: Invalid input(s): POCBNP CBG: No results for input(s): GLUCAP in the last 168 hours. D-Dimer No results for input(s): DDIMER in the last 72 hours. Hgb A1c No results for input(s): HGBA1C in the last 72 hours. Lipid Profile No results for input(s): CHOL, HDL, LDLCALC, TRIG, CHOLHDL, LDLDIRECT in the last 72 hours. Thyroid  function studies No results for input(s): TSH, T4TOTAL, T3FREE, THYROIDAB in the last 72 hours.  Invalid input(s): FREET3 Anemia work up No results for input(s): VITAMINB12, FOLATE, FERRITIN, TIBC, IRON , RETICCTPCT in the last 72 hours. Urinalysis    Component Value Date/Time   COLORURINE YELLOW 01/27/2024 2105   APPEARANCEUR HAZY (A) 01/27/2024 2105   LABSPEC 1.011 01/27/2024 2105   PHURINE 6.0 01/27/2024 2105   GLUCOSEU NEGATIVE 01/27/2024 2105   HGBUR SMALL (A) 01/27/2024 2105   BILIRUBINUR NEGATIVE 01/27/2024 2105   KETONESUR NEGATIVE 01/27/2024 2105   PROTEINUR 30 (A) 01/27/2024 2105   NITRITE NEGATIVE 01/27/2024 2105   LEUKOCYTESUR SMALL (A) 01/27/2024 2105   Sepsis Labs Recent Labs  Lab 01/27/24 1700 01/28/24 0320 01/29/24 0408  WBC 13.3* 11.4* 7.9   Microbiology Recent Results (from the past 240 hours)  Urine Culture     Status: Abnormal   Collection Time: 01/27/24  9:05 PM   Specimen: Urine, Random  Result Value Ref Range Status   Specimen Description   Final    URINE, RANDOM Performed at [facility], [address], [place], [place] [address]    Special Requests   Final    NONE Reflexed from [id number] Performed at [facility], [address], [place], [place] [address]    Culture (A)  Final    <10,000 COLONIES/mL INSIGNIFICANT GROWTH Performed at [facility], [address] [place], [place] [address]    Report Status 01/29/2024 FINAL  Final  Resp panel by RT-PCR (RSV, Flu A&B, Covid) Anterior Nasal Swab     Status: None   Collection Time: 01/27/24 10:07 PM   Specimen:  Anterior Nasal Swab  Result Value Ref Range Status   SARS Coronavirus 2 by RT PCR NEGATIVE NEGATIVE Final    Comment: (NOTE) SARS-CoV-2 target nucleic acids are NOT DETECTED.  The SARS-CoV-2 RNA is generally detectable in upper respiratory specimens during the acute phase of infection. The lowest concentration of SARS-CoV-2 viral copies this assay can detect is 138 copies/mL. A negative result does not preclude SARS-Cov-2 infection and should not be used as the sole basis for treatment or other patient management decisions. A negative result may occur with  improper specimen collection/handling, submission of specimen other than nasopharyngeal swab, presence of viral mutation(s) within the areas targeted by this assay, and inadequate number of viral copies(<138 copies/mL). A negative result must be combined with clinical observations, patient history, and epidemiological information. The expected result is Negative.  Fact Sheet for Patients:  bloggercourse.com  Fact Sheet for Healthcare Providers:  seriousbroker.it  This test is no t yet approved or cleared by the [place]  FDA and  has been authorized for detection and/or diagnosis of SARS-CoV-2 by  FDA under an Emergency Use Authorization (EUA). This EUA will remain  in effect (meaning this test can be used) for the duration of the COVID-19 declaration under Section 564(b)(1) of the Act, 21 U.S.C.section 360bbb-3(b)(1), unless the authorization is terminated  or revoked sooner.       Influenza A by PCR NEGATIVE NEGATIVE Final   Influenza B by PCR NEGATIVE NEGATIVE Final    Comment: (NOTE) The Xpert Xpress SARS-CoV-2/FLU/RSV plus assay is intended as an aid in the diagnosis of influenza from Nasopharyngeal swab specimens and should not be used as a sole basis for treatment. Nasal washings and aspirates are unacceptable for Xpert Xpress SARS-CoV-2/FLU/RSV testing.  Fact  Sheet for Patients: bloggercourse.com  Fact Sheet for Healthcare Providers: seriousbroker.it  This test is not yet approved or cleared by the [place]  FDA and has been authorized for detection and/or diagnosis of SARS-CoV-2 by FDA under an Emergency Use Authorization (EUA). This EUA will remain in effect (meaning this test can be used) for the duration of the COVID-19 declaration under Section 564(b)(1) of the Act, 21 U.S.C. section 360bbb-3(b)(1), unless the authorization is terminated or revoked.     Resp Syncytial Virus by PCR NEGATIVE NEGATIVE Final    Comment: (NOTE) Fact Sheet for Patients: bloggercourse.com  Fact Sheet for Healthcare Providers: seriousbroker.it  This test is not yet approved or cleared by the [place]  FDA and has been authorized for detection and/or diagnosis of SARS-CoV-2 by FDA under an Emergency Use Authorization (EUA). This EUA will remain in effect (meaning this test can be used) for the duration of the COVID-19 declaration under Section 564(b)(1) of the Act, 21 U.S.C. section 360bbb-3(b)(1), unless the authorization is terminated or revoked.  Performed at [facility], [address], [place], [place] [address]   Blood culture (routine x 2)     Status: None (Preliminary result)   Collection Time: 01/27/24 10:50 PM   Specimen: BLOOD RIGHT HAND  Result Value Ref Range Status   Specimen Description BLOOD RIGHT HAND  Final   Special Requests   Final    BOTTLES DRAWN AEROBIC AND ANAEROBIC Blood Culture adequate volume   Culture   Final    NO GROWTH 4 DAYS Performed at [facility], [address] [place], [place] [address]    Report Status PENDING  Incomplete  Blood culture (routine x 2)     Status: None (Preliminary result)   Collection Time: 01/27/24 11:00 PM   Specimen: BLOOD RIGHT HAND  Result Value Ref Range Status   Specimen  Description BLOOD RIGHT HAND  Final   Special Requests   Final    BOTTLES DRAWN AEROBIC AND ANAEROBIC Blood Culture adequate volume   Culture   Final    NO GROWTH 4 DAYS Performed at [facility], [address], [place], [place] [address]    Report Status PENDING  Incomplete   Time coordinating discharge: 41 mins  SIGNED:  Afton Louder, MD  [facility] 01/31/2024, 11:29 AM How to contact the [facility] Attending or Consulting provider 7A - 7P or covering provider during after hours 7P -7A, for this patient?  Check the care team in [facility] and look for a) attending/consulting TR[facility] provider listed and b) the TRH team listed Log into [email or web address] and use [facility]'s universal password to access. If you do not have the password, please contact the hospital operator. Locate the [facility] provider you are looking for under [facility] and page to a number that you can be directly  reached. If you still have difficulty reaching the provider, please page the [facility] (Director on Call) for the Hospitalists listed on amion for assistance.

## 2024-01-31 NOTE — Plan of Care (Signed)

## 2024-02-01 ENCOUNTER — Telehealth: Payer: Self-pay

## 2024-02-01 LAB — CULTURE, BLOOD (ROUTINE X 2)
Culture: NO GROWTH
Culture: NO GROWTH
Special Requests: ADEQUATE
Special Requests: ADEQUATE

## 2024-02-01 NOTE — Transitions of Care (Post Inpatient/ED Visit) (Signed)
   02/01/2024  Name: Patricia Jackson MRN: [id number] DOB: [birth year]  Today's TOC FU Call Status: Today's TOC FU Call Status:: Unsuccessful Call (1st Attempt) Unsuccessful Call (1st Attempt) Date: 02/01/24  Attempted to reach the patient regarding the most recent Inpatient/ED visit.  Follow Up Plan: Additional outreach attempts will be made to reach the patient to complete the Transitions of Care (Post Inpatient/ED visit) call.   Bryttani Blew J. Khanh Tanori RN, MSN [facility], [facility] RN Care Manager Direct Dial: [phone]  Fax: [phone] Website: [email or web address]

## 2024-02-02 ENCOUNTER — Telehealth: Payer: Self-pay

## 2024-02-02 DIAGNOSIS — E039 Hypothyroidism, unspecified: Secondary | ICD-10-CM | POA: Diagnosis not present

## 2024-02-02 DIAGNOSIS — J449 Chronic obstructive pulmonary disease, unspecified: Secondary | ICD-10-CM | POA: Diagnosis not present

## 2024-02-02 DIAGNOSIS — N184 Chronic kidney disease, stage 4 (severe): Secondary | ICD-10-CM | POA: Diagnosis not present

## 2024-02-02 DIAGNOSIS — D500 Iron deficiency anemia secondary to blood loss (chronic): Secondary | ICD-10-CM | POA: Diagnosis not present

## 2024-02-02 DIAGNOSIS — Z09 Encounter for follow-up examination after completed treatment for conditions other than malignant neoplasm: Secondary | ICD-10-CM | POA: Diagnosis not present

## 2024-02-02 DIAGNOSIS — I10 Essential (primary) hypertension: Secondary | ICD-10-CM | POA: Diagnosis not present

## 2024-02-02 DIAGNOSIS — F5101 Primary insomnia: Secondary | ICD-10-CM | POA: Diagnosis not present

## 2024-02-02 DIAGNOSIS — I4891 Unspecified atrial fibrillation: Secondary | ICD-10-CM | POA: Diagnosis not present

## 2024-02-02 DIAGNOSIS — D509 Iron deficiency anemia, unspecified: Secondary | ICD-10-CM | POA: Diagnosis not present

## 2024-02-02 DIAGNOSIS — E559 Vitamin D deficiency, unspecified: Secondary | ICD-10-CM | POA: Diagnosis not present

## 2024-02-02 DIAGNOSIS — E782 Mixed hyperlipidemia: Secondary | ICD-10-CM | POA: Diagnosis not present

## 2024-02-02 DIAGNOSIS — K219 Gastro-esophageal reflux disease without esophagitis: Secondary | ICD-10-CM | POA: Diagnosis not present

## 2024-02-02 NOTE — Transitions of Care (Post Inpatient/ED Visit) (Signed)
 02/02/2024  Name: Patricia Jackson MRN: [id number] DOB: 01/11/1941  Today's TOC FU Call Status: Today's TOC FU Call Status:: Successful TOC FU Call Completed TOC FU Call Complete Date: 02/02/24 Patient's Name and Date of Birth confirmed.  Transition Care Management Follow-up Telephone Call How have you been since you were released from the hospital?: Better (feel weak but better) Any questions or concerns?: No  Items Reviewed: Did you receive and understand the discharge instructions provided?: Yes Medications obtained,verified, and reconciled?: Yes (Medications Reviewed) Any new allergies since your discharge?: No Dietary orders reviewed?: Yes (add nurtitional supplement-) Do you have support at home?: Yes People in Home [RPT]: child(ren), adult  Medications Reviewed Today: Medications Reviewed Today     Reviewed by Rumalda Alan PENNER, RN (Registered Nurse) on 02/02/24 at 1447  Med List Status: <None>   Medication Order Taking? Sig Documenting Provider Last Dose Status Informant  acetaminophen  (TYLENOL ) 500 MG tablet [id number] Yes Take 1,000 mg by mouth every 8 (eight) hours as needed for mild pain or moderate pain. [provider]  Active Self, Pharmacy Records  albuterol  (PROVENTIL ) (2.5 MG/3ML) 0.083% nebulizer solution [id number] Yes Take 2.5 mg by nebulization every 6 (six) hours as needed for shortness of breath or wheezing. [provider]  Active Self, Pharmacy Records  albuterol  (VENTOLIN  HFA) 108 [phone] Base) MCG/ACT inhaler [id number] Yes Inhale 2 puffs into the lungs every 4 (four) hours as needed for wheezing or shortness of breath. [provider]  Active Self, Pharmacy Records  amiodarone  (PACERONE ) 200 MG tablet [id number] Yes Take 1 tablet (200 mg total) by mouth daily. Take 1 Tablet ( 200 mg ) Daily and none on Sunday Johnson, Clanford L, MD  Active   amLODipine  (NORVASC ) 5 MG tablet [id number] Yes Take 5 mg by mouth daily. [provider]   Active Self, Pharmacy Records  apixaban  (ELIQUIS ) 2.5 MG TABS tablet [id number] Yes Take 1 tablet (2.5 mg total) by mouth 2 (two) times daily. Alvan Dorn FALCON, MD  Active Self, Pharmacy Records  cefUROXime (CEFTIN) 500 MG tablet [id number] Yes Take 1 tablet (500 mg total) by mouth 2 (two) times daily with a meal for 2 days. Vicci Afton CROME, MD  Active   ferrous sulfate  324 MG TBEC [id number] Yes Take 324 mg by mouth daily with breakfast. [provider]  Active Self, Pharmacy Records  levothyroxine  (SYNTHROID ) 75 MCG tablet [id number] Yes Take 75 mcg by mouth daily before breakfast. [provider]  Active Self, Pharmacy Records  losartan (COZAAR) 25 MG tablet [id number] Yes Take 25 mg by mouth at bedtime. [provider]  Active Self, Pharmacy Records  lubiprostone  (AMITIZA ) 24 MCG capsule [id number] Yes Take 1 capsule (24 mcg total) by mouth 2 (two) times daily with a meal. Shirlean Therisa ORN, NP  Active Self, Pharmacy Records  nitroGLYCERIN  (NITROSTAT ) 0.4 MG SL tablet [id number]  Place 1 tablet (0.4 mg total) under the tongue every 5 (five) minutes as needed for chest pain.  Patient not taking: Reported on 02/02/2024   Vicci Afton CROME, MD  Active   pantoprazole  (PROTONIX ) 40 MG tablet [id number] Yes Take 1 tablet (40 mg total) by mouth 2 (two) times daily. Maree Bracken D, DO  Active Self, Pharmacy Records  prednisoLONE  acetate (PRED FORTE ) 1 % ophthalmic suspension [id number] Yes Place 1 drop into the right eye See admin instructions. Place 1 drop in right eye 4 times daily for 1 week, 3 times daily for 1 week, 2 times daily for 1  week, then 1 time daily for 1 week, then stop. [provider]  Active Self, Pharmacy Records  rosuvastatin  (CRESTOR ) 20 MG tablet [id number] Yes Take 20 mg by mouth daily. [provider]  Active Self, Pharmacy Records  timolol (TIMOPTIC) 0.5 % ophthalmic solution [id number] Yes Place 1 drop into the right eye 2 (two) times daily.  [provider]  Active Self, Pharmacy Records  traZODone  (DESYREL ) 50 MG tablet [id number] Yes Take 50 mg by mouth at bedtime. [provider]  Active Self, Pharmacy Records  [facility] ELLIPTA 200-62.5-25 MCG/ACT AEPB [id number] Yes Inhale 1 puff into the lungs daily. [provider]  Active Self, Pharmacy Records           Today's Vitals   02/02/24 1452  PainSc: 4     Home Care and Equipment/Supplies: Were Home Health Services Ordered?: Yes Name of Home Health Agency:: unknown agency Has Agency set up a time to come to your home?: Yes First Home Health Visit Date: 02/02/24 Any new equipment or medical supplies ordered?: No  Functional Questionnaire: Do you need assistance with bathing/showering or dressing?: No Do you need assistance with meal preparation?: No Do you need assistance with eating?: No Do you have difficulty maintaining continence: No Do you need assistance with getting out of bed/getting out of a chair/moving?: No Do you have difficulty managing or taking your medications?: No  Follow up appointments reviewed: PCP Follow-up appointment confirmed?: Yes Date of PCP follow-up appointment?: 02/10/24 Follow-up Provider: PCP Specialist Hospital Follow-up appointment confirmed?: No Reason Specialist Follow-Up Not Confirmed: Patient has Specialist Provider Number and will Call for Appointment Do you need transportation to your follow-up appointment?: No Do you understand care options if your condition(s) worsen?: Yes-patient verbalized understanding  SDOH Interventions Today    Flowsheet Row Most Recent Value  SDOH Interventions   Food Insecurity Interventions Intervention Not Indicated  Housing Interventions Intervention Not Indicated  Transportation Interventions Intervention Not Indicated  Utilities Interventions Intervention Not Indicated   Placed call to patient who reports that she is doing well. Reports home health came to see her  today. ( Patient unable to tell me the company).  Patient reports that she is feeling better and continues to take her antibiotics as prescribed.  Reports that she does not like to drink water  but she is trying. She also reports that she drank some ensure today.  Interventions:  Reviewed medications. Reviewed importance of follow up. Already has PCP appoitntment. Encouraged patient to drink well and increase her hydration Reviewed importance of healthy diet to fight off infection. Encouraged patient to complete all her antibiotics.  Reviewed with patient when to call MD.  Reviewed and offered 30 day TOC program and patient declined.  Alan Ee, RN, BSN, CEN [employer]- Transition of Care Team.  [facility] [phone]

## 2024-02-02 NOTE — Transitions of Care (Post Inpatient/ED Visit) (Signed)
   02/02/2024  Name: Patricia Jackson MRN: [id number] DOB: 29-Sep-1940  Today's TOC FU Call Status: Today's TOC FU Call Status:: Unsuccessful Call (2nd Attempt) Unsuccessful Call (2nd Attempt) Date: 02/02/24  Attempted to reach the patient regarding the most recent Inpatient/ED visit.  Follow Up Plan: Additional outreach attempts will be made to reach the patient to complete the Transitions of Care (Post Inpatient/ED visit) call.   Tamella Tuccillo J. Linnea Todisco RN, MSN [facility], [facility] RN Care Manager Direct Dial: [phone]  Fax: [phone] Website: [email or web address]

## 2024-02-07 ENCOUNTER — Telehealth: Payer: Self-pay | Admitting: Cardiology

## 2024-02-07 ENCOUNTER — Encounter: Attending: Internal Medicine | Admitting: Internal Medicine

## 2024-02-07 VITALS — BP 146/80 | HR 64 | Temp 98.0°F | Resp 16

## 2024-02-07 DIAGNOSIS — D508 Other iron deficiency anemias: Secondary | ICD-10-CM | POA: Diagnosis not present

## 2024-02-07 DIAGNOSIS — M810 Age-related osteoporosis without current pathological fracture: Secondary | ICD-10-CM | POA: Insufficient documentation

## 2024-02-07 DIAGNOSIS — D649 Anemia, unspecified: Secondary | ICD-10-CM | POA: Insufficient documentation

## 2024-02-07 MED ORDER — DIPHENHYDRAMINE HCL 25 MG PO CAPS
25.0000 mg | ORAL_CAPSULE | Freq: Once | ORAL | Status: AC
  Administered 2024-02-07: 25 mg via ORAL

## 2024-02-07 MED ORDER — ACETAMINOPHEN 325 MG PO TABS
650.0000 mg | ORAL_TABLET | Freq: Once | ORAL | Status: AC
  Administered 2024-02-07: 650 mg via ORAL

## 2024-02-07 MED ORDER — SODIUM CHLORIDE 0.9 % IV SOLN
510.0000 mg | Freq: Once | INTRAVENOUS | Status: AC
  Administered 2024-02-07: 510 mg via INTRAVENOUS
  Filled 2024-02-07: qty 17

## 2024-02-07 NOTE — Progress Notes (Signed)
 Diagnosis: Iron  Deficiency Anemia  Provider:  Lauraine FORBES Lunger, NP  Procedure: IV Infusion  IV Type: Peripheral, IV Location: L Hand  Feraheme  (Ferumoxytol ), Dose: 510 mg  Infusion Start Time: 1014  Infusion Stop Time: 1029  Post Infusion IV Care: Observation period completed  Discharge: Condition: Good, Destination: Home . AVS Provided  Performed by:  Blanca Selinda SAUNDERS, LPN

## 2024-02-07 NOTE — Telephone Encounter (Signed)
   Pre-operative Risk Assessment    Patient Name: Patricia Jackson  DOB: 02/15/1941 MRN: [id number]     Request for Surgical Clearance    Procedure:  Dental Extraction - Amount of Teeth to be Pulled:  5 total  Date of Surgery:  Clearance TBD                                 Surgeon:  Not indicated Surgeon's Group or Practice Name:  [facility] Phone number:  [phone] Fax number:  [phone]   Type of Clearance Requested:   - Medical  - Pharmacy:  Hold anticoagulant  Pt w/current anticoagulant therapy/is she safe to hold prior to dental extractions, if so please provide holding duration.   Type of Anesthesia:  Local    Additional requests/questions:    Signed, Alan KANDICE Corona   02/07/2024, 9:43 AM

## 2024-02-07 NOTE — Telephone Encounter (Signed)
   Name:  Patricia Jackson  DOB:  1940-04-10  MRN:  [id number]   Primary Cardiologist: Alvan Carrier, MD  Chart reviewed as part of pre-operative protocol coverage.  Patricia Jackson was last seen on 11/25/23 by Almarie Crate, NP.  Due to the patient already having an appointment scheduled, we will address request for clearance at that time.   Request also routed to pharmacy for guidance on holding Eliquis .   Pre-op covering staff: - Please schedule appointment and call patient to inform them. If patient already had an upcoming appointment within acceptable timeframe, please add pre-op clearance to the appointment notes so provider is aware. - Please contact requesting surgeon's office via preferred method (i.e, phone, fax) to inform them of need for appointment prior to surgery.  Delon JAYSON Hoover, NP 02/07/2024, 10:08 AM

## 2024-02-14 ENCOUNTER — Encounter (INDEPENDENT_AMBULATORY_CARE_PROVIDER_SITE_OTHER): Admitting: Internal Medicine

## 2024-02-14 VITALS — BP 159/81 | HR 62 | Temp 98.3°F | Resp 16

## 2024-02-14 DIAGNOSIS — D508 Other iron deficiency anemias: Secondary | ICD-10-CM | POA: Diagnosis not present

## 2024-02-14 DIAGNOSIS — D649 Anemia, unspecified: Secondary | ICD-10-CM

## 2024-02-14 DIAGNOSIS — M810 Age-related osteoporosis without current pathological fracture: Secondary | ICD-10-CM

## 2024-02-14 MED ORDER — DENOSUMAB 60 MG/ML ~~LOC~~ SOSY: 60.0000 mg | PREFILLED_SYRINGE | Freq: Once | SUBCUTANEOUS | Status: DC

## 2024-02-14 MED ORDER — ACETAMINOPHEN 325 MG PO TABS
650.0000 mg | ORAL_TABLET | Freq: Once | ORAL | Status: AC
  Administered 2024-02-14: 650 mg via ORAL

## 2024-02-14 MED ORDER — SODIUM CHLORIDE 0.9 % IV SOLN
510.0000 mg | Freq: Once | INTRAVENOUS | Status: AC
  Administered 2024-02-14: 510 mg via INTRAVENOUS
  Filled 2024-02-14: qty 17

## 2024-02-14 MED ORDER — DIPHENHYDRAMINE HCL 25 MG PO CAPS
25.0000 mg | ORAL_CAPSULE | Freq: Once | ORAL | Status: AC
  Administered 2024-02-14: 25 mg via ORAL

## 2024-02-14 NOTE — Progress Notes (Signed)
 Diagnosis: Iron  Deficiency Anemia  Provider:  Lauraine Lunger, NP  Procedure: IV Infusion  IV Type: Peripheral, IV Location: R Forearm  Feraheme  (Ferumoxytol ), Dose: 510 mg  Infusion Start Time: 1113  Infusion Stop Time: 1130  Post Infusion IV Care: Observation period completed  Discharge: Condition: Good, Destination: Home . AVS Provided  Performed by:  Blanca Selinda SAUNDERS, LPN

## 2024-02-16 NOTE — Telephone Encounter (Signed)
 Patient with diagnosis of atrial fibrillation on Eliquis  for anticoagulation.    Procedure:  Dental Extraction - Amount of Teeth to be Pulled:  5 total   Date of Surgery:  Clearance TBD      CHA2DS2-VASc Score = 5   This indicates a 7.2% annual risk of stroke. The patient's score is based upon: CHF History: 0 HTN History: 1 Diabetes History: 0 Stroke History: 0 Vascular Disease History: 1 Age Score: 2 Gender Score: 1    CrCl 39 Platelet count 84  Patient has not had an Afib/aflutter ablation in the last 3 months, DCCV within the last 4 weeks or a watchman implanted in the last 45 days   Patient does not require pre-op antibiotics for dental procedure.  Per office protocol, patient can hold Eliquis  for 2 days prior to procedure.   Patient will not need bridging with Lovenox  (enoxaparin ) around procedure.  **This guidance is not considered finalized until pre-operative APP has relayed final recommendations.**

## 2024-02-17 NOTE — Telephone Encounter (Signed)
 Pt has appt 02/24/24 with Almarie Crate, NP for preop clearance. I s/w the pt and reminded her of her appt with Almarie Crate, NP 02/24/24. Pt thought that we could just sign a paper and fax to the DDS. I said no, especially cardiac pt's need to be assessed and to be sure ok to hold blood thinners and have procedure. I assured her that I will let the DDS office know she has appt 02/24/24. Pt thanked me for the call and help.

## 2024-02-17 NOTE — Telephone Encounter (Signed)
 Patient called to follow-up on having clearance faxed to [phone] to have her dental procedure and how long she should hold her medication.

## 2024-02-22 ENCOUNTER — Emergency Department (HOSPITAL_COMMUNITY)

## 2024-02-22 ENCOUNTER — Encounter (HOSPITAL_COMMUNITY): Payer: Self-pay | Admitting: *Deleted

## 2024-02-22 ENCOUNTER — Inpatient Hospital Stay (HOSPITAL_COMMUNITY)
Admission: EM | Admit: 2024-02-22 | Discharge: 2024-02-25 | DRG: 872 | Disposition: A | Discharge status: Home / Self Care | Attending: Internal Medicine | Admitting: Internal Medicine

## 2024-02-22 ENCOUNTER — Other Ambulatory Visit: Payer: Self-pay

## 2024-02-22 DIAGNOSIS — Z885 Allergy status to narcotic agent status: Secondary | ICD-10-CM

## 2024-02-22 DIAGNOSIS — A419 Sepsis, unspecified organism: Secondary | ICD-10-CM | POA: Diagnosis present

## 2024-02-22 DIAGNOSIS — J449 Chronic obstructive pulmonary disease, unspecified: Secondary | ICD-10-CM | POA: Diagnosis present

## 2024-02-22 DIAGNOSIS — E860 Dehydration: Secondary | ICD-10-CM | POA: Diagnosis present

## 2024-02-22 DIAGNOSIS — Z87891 Personal history of nicotine dependence: Secondary | ICD-10-CM

## 2024-02-22 DIAGNOSIS — N179 Acute kidney failure, unspecified: Secondary | ICD-10-CM | POA: Diagnosis present

## 2024-02-22 DIAGNOSIS — I129 Hypertensive chronic kidney disease with stage 1 through stage 4 chronic kidney disease, or unspecified chronic kidney disease: Secondary | ICD-10-CM | POA: Diagnosis present

## 2024-02-22 DIAGNOSIS — Z79890 Hormone replacement therapy (postmenopausal): Secondary | ICD-10-CM | POA: Diagnosis not present

## 2024-02-22 DIAGNOSIS — Z95820 Peripheral vascular angioplasty status with implants and grafts: Secondary | ICD-10-CM

## 2024-02-22 DIAGNOSIS — E782 Mixed hyperlipidemia: Secondary | ICD-10-CM | POA: Diagnosis present

## 2024-02-22 DIAGNOSIS — Z8249 Family history of ischemic heart disease and other diseases of the circulatory system: Secondary | ICD-10-CM | POA: Diagnosis not present

## 2024-02-22 DIAGNOSIS — E039 Hypothyroidism, unspecified: Secondary | ICD-10-CM | POA: Diagnosis present

## 2024-02-22 DIAGNOSIS — D631 Anemia in chronic kidney disease: Secondary | ICD-10-CM | POA: Diagnosis present

## 2024-02-22 DIAGNOSIS — I480 Paroxysmal atrial fibrillation: Secondary | ICD-10-CM | POA: Diagnosis present

## 2024-02-22 DIAGNOSIS — E872 Acidosis, unspecified: Secondary | ICD-10-CM | POA: Diagnosis present

## 2024-02-22 DIAGNOSIS — A4151 Sepsis due to Escherichia coli [E. coli]: Secondary | ICD-10-CM | POA: Diagnosis present

## 2024-02-22 DIAGNOSIS — N184 Chronic kidney disease, stage 4 (severe): Secondary | ICD-10-CM | POA: Diagnosis present

## 2024-02-22 DIAGNOSIS — I252 Old myocardial infarction: Secondary | ICD-10-CM | POA: Diagnosis not present

## 2024-02-22 DIAGNOSIS — Z7901 Long term (current) use of anticoagulants: Secondary | ICD-10-CM | POA: Diagnosis not present

## 2024-02-22 DIAGNOSIS — J8410 Pulmonary fibrosis, unspecified: Secondary | ICD-10-CM | POA: Diagnosis present

## 2024-02-22 DIAGNOSIS — I2510 Atherosclerotic heart disease of native coronary artery without angina pectoris: Secondary | ICD-10-CM | POA: Diagnosis present

## 2024-02-22 DIAGNOSIS — I959 Hypotension, unspecified: Secondary | ICD-10-CM | POA: Diagnosis present

## 2024-02-22 DIAGNOSIS — Z853 Personal history of malignant neoplasm of breast: Secondary | ICD-10-CM

## 2024-02-22 DIAGNOSIS — N390 Urinary tract infection, site not specified: Secondary | ICD-10-CM | POA: Diagnosis present

## 2024-02-22 DIAGNOSIS — Z888 Allergy status to other drugs, medicaments and biological substances status: Secondary | ICD-10-CM

## 2024-02-22 DIAGNOSIS — Z8719 Personal history of other diseases of the digestive system: Secondary | ICD-10-CM

## 2024-02-22 DIAGNOSIS — D539 Nutritional anemia, unspecified: Secondary | ICD-10-CM | POA: Diagnosis present

## 2024-02-22 DIAGNOSIS — Z955 Presence of coronary angioplasty implant and graft: Secondary | ICD-10-CM

## 2024-02-22 DIAGNOSIS — B9689 Other specified bacterial agents as the cause of diseases classified elsewhere: Secondary | ICD-10-CM | POA: Diagnosis present

## 2024-02-22 DIAGNOSIS — Z9049 Acquired absence of other specified parts of digestive tract: Secondary | ICD-10-CM

## 2024-02-22 DIAGNOSIS — Z79899 Other long term (current) drug therapy: Secondary | ICD-10-CM

## 2024-02-22 DIAGNOSIS — D696 Thrombocytopenia, unspecified: Secondary | ICD-10-CM | POA: Diagnosis present

## 2024-02-22 DIAGNOSIS — Z1152 Encounter for screening for COVID-19: Secondary | ICD-10-CM

## 2024-02-22 DIAGNOSIS — Z880 Allergy status to penicillin: Secondary | ICD-10-CM

## 2024-02-22 DIAGNOSIS — I10 Essential (primary) hypertension: Secondary | ICD-10-CM | POA: Diagnosis present

## 2024-02-22 LAB — LACTIC ACID, PLASMA
Lactic Acid, Venous: 2.9 mmol/L (ref 0.5–1.9)
Lactic Acid, Venous: 1.3 mmol/L (ref 0.5–1.9)

## 2024-02-22 LAB — COMPREHENSIVE METABOLIC PANEL WITH GFR
ALT: 18 U/L (ref 0–44)
AST: 39 U/L (ref 15–41)
Albumin: 3.6 g/dL (ref 3.5–5.0)
Alkaline Phosphatase: 71 U/L (ref 38–126)
Anion gap: 13 (ref 5–15)
BUN: 27 mg/dL — ABNORMAL HIGH (ref 8–23)
CO2: 21 mmol/L — ABNORMAL LOW (ref 22–32)
Calcium: 9 mg/dL (ref 8.9–10.3)
Chloride: 105 mmol/L (ref 98–111)
Creatinine, Ser: 1.65 mg/dL — ABNORMAL HIGH (ref 0.44–1.00)
GFR, Estimated: 31 mL/min — ABNORMAL LOW (ref >60)
Glucose, Bld: 127 mg/dL — ABNORMAL HIGH (ref 70–99)
Potassium: 4.2 mmol/L (ref 3.5–5.1)
Sodium: 138 mmol/L (ref 135–145)
Total Bilirubin: 0.6 mg/dL (ref 0.0–1.2)
Total Protein: 6.6 g/dL (ref 6.5–8.1)

## 2024-02-22 LAB — CBC WITH DIFFERENTIAL/PLATELET
Abs Immature Granulocytes: 0.07 K/uL (ref 0.00–0.07)
Basophils Absolute: 0 K/uL (ref 0.0–0.1)
Basophils Relative: 0 %
Eosinophils Absolute: 0 K/uL (ref 0.0–0.5)
Eosinophils Relative: 0 %
HCT: 33.2 % — ABNORMAL LOW (ref 36.0–46.0)
Hemoglobin: 10.3 g/dL — ABNORMAL LOW (ref 12.0–15.0)
Immature Granulocytes: 1 %
Lymphocytes Relative: 4 %
Lymphs Abs: 0.3 K/uL — ABNORMAL LOW (ref 0.7–4.0)
MCH: 31.8 pg (ref 26.0–34.0)
MCHC: 31 g/dL (ref 30.0–36.0)
MCV: 102.5 fL — ABNORMAL HIGH (ref 80.0–100.0)
Monocytes Absolute: 0.7 K/uL (ref 0.1–1.0)
Monocytes Relative: 8 %
Neutro Abs: 7.3 K/uL (ref 1.7–7.7)
Neutrophils Relative %: 87 %
Platelets: 81 K/uL — ABNORMAL LOW (ref 150–400)
RBC: 3.24 MIL/uL — ABNORMAL LOW (ref 3.87–5.11)
RDW: 18.6 % — ABNORMAL HIGH (ref 11.5–15.5)
WBC: 8.4 K/uL (ref 4.0–10.5)
nRBC: 0 % (ref 0.0–0.2)

## 2024-02-22 LAB — URINALYSIS, ROUTINE W REFLEX MICROSCOPIC
Bilirubin Urine: NEGATIVE
Glucose, UA: NEGATIVE mg/dL
Ketones, ur: NEGATIVE mg/dL
Nitrite: NEGATIVE
Protein, ur: NEGATIVE mg/dL
Specific Gravity, Urine: 1.005 (ref 1.005–1.030)
WBC, UA: >50 WBC/hpf (ref 0–5)
pH: 6 (ref 5.0–8.0)

## 2024-02-22 LAB — MAGNESIUM: Magnesium: 1.9 mg/dL (ref 1.7–2.4)

## 2024-02-22 LAB — LIPASE, BLOOD: Lipase: 56 U/L — ABNORMAL HIGH (ref 11–51)

## 2024-02-22 LAB — RESP PANEL BY RT-PCR (RSV, FLU A&B, COVID)  RVPGX2
Influenza A by PCR: NEGATIVE
Influenza B by PCR: NEGATIVE
Resp Syncytial Virus by PCR: NEGATIVE
SARS Coronavirus 2 by RT PCR: NEGATIVE

## 2024-02-22 LAB — VITAMIN B12: Vitamin B-12: 738 pg/mL (ref 180–914)

## 2024-02-22 LAB — PHOSPHORUS: Phosphorus: 3.3 mg/dL (ref 2.5–4.6)

## 2024-02-22 MED ORDER — TRAZODONE HCL 50 MG PO TABS
50.0000 mg | ORAL_TABLET | Freq: Every day | ORAL | Status: DC
  Administered 2024-02-22 – 2024-02-24 (×3): 50 mg via ORAL
  Filled 2024-02-22 (×3): qty 1

## 2024-02-22 MED ORDER — BUDESON-GLYCOPYRROL-FORMOTEROL 160-9-4.8 MCG/ACT IN AERO
2.0000 | INHALATION_SPRAY | Freq: Two times a day (BID) | RESPIRATORY_TRACT | Status: DC
  Administered 2024-02-23 – 2024-02-25 (×5): 2 via RESPIRATORY_TRACT
  Filled 2024-02-22 (×2): qty 5.9

## 2024-02-22 MED ORDER — SERTRALINE HCL 25 MG PO TABS
25.0000 mg | ORAL_TABLET | Freq: Every day | ORAL | Status: DC
  Administered 2024-02-23 – 2024-02-25 (×3): 25 mg via ORAL
  Filled 2024-02-22 (×3): qty 1

## 2024-02-22 MED ORDER — ACETAMINOPHEN 500 MG PO TABS
1000.0000 mg | ORAL_TABLET | Freq: Once | ORAL | Status: AC
  Administered 2024-02-22: 1000 mg via ORAL
  Filled 2024-02-22: qty 2

## 2024-02-22 MED ORDER — ACETAMINOPHEN 325 MG PO TABS
650.0000 mg | ORAL_TABLET | Freq: Four times a day (QID) | ORAL | Status: DC | PRN
  Administered 2024-02-24: 650 mg via ORAL
  Filled 2024-02-22: qty 2

## 2024-02-22 MED ORDER — MORPHINE SULFATE (PF) 2 MG/ML IV SOLN: 1.0000 mg | INTRAVENOUS | Status: DC | PRN

## 2024-02-22 MED ORDER — PANTOPRAZOLE SODIUM 40 MG PO TBEC
40.0000 mg | DELAYED_RELEASE_TABLET | Freq: Two times a day (BID) | ORAL | Status: DC
  Administered 2024-02-22 – 2024-02-25 (×6): 40 mg via ORAL
  Filled 2024-02-22 (×6): qty 1

## 2024-02-22 MED ORDER — SODIUM CHLORIDE 0.9 % IV BOLUS
500.0000 mL | Freq: Once | INTRAVENOUS | Status: AC
  Administered 2024-02-22: 500 mL via INTRAVENOUS

## 2024-02-22 MED ORDER — LACTATED RINGERS IV SOLN: INTRAVENOUS | Status: AC

## 2024-02-22 MED ORDER — ONDANSETRON HCL 4 MG/2ML IJ SOLN
4.0000 mg | Freq: Once | INTRAMUSCULAR | Status: AC
  Administered 2024-02-22: 4 mg via INTRAVENOUS
  Filled 2024-02-22: qty 2

## 2024-02-22 MED ORDER — SODIUM CHLORIDE 0.9 % IV BOLUS
1000.0000 mL | Freq: Once | INTRAVENOUS | Status: AC
  Administered 2024-02-22: 1000 mL via INTRAVENOUS

## 2024-02-22 MED ORDER — LEVOTHYROXINE SODIUM 75 MCG PO TABS
75.0000 ug | ORAL_TABLET | Freq: Every day | ORAL | Status: DC
  Administered 2024-02-23 – 2024-02-25 (×3): 75 ug via ORAL
  Filled 2024-02-22 (×4): qty 1

## 2024-02-22 MED ORDER — HYDROCODONE-ACETAMINOPHEN 5-325 MG PO TABS: 1.0000 | ORAL_TABLET | ORAL | Status: DC | PRN

## 2024-02-22 MED ORDER — AMIODARONE HCL 200 MG PO TABS
200.0000 mg | ORAL_TABLET | Freq: Every day | ORAL | Status: DC
  Administered 2024-02-23 – 2024-02-25 (×3): 200 mg via ORAL
  Filled 2024-02-22 (×3): qty 1

## 2024-02-22 MED ORDER — SODIUM CHLORIDE 0.9 % IV SOLN: 1.0000 g | INTRAVENOUS | Status: DC

## 2024-02-22 MED ORDER — APIXABAN 2.5 MG PO TABS
2.5000 mg | ORAL_TABLET | Freq: Two times a day (BID) | ORAL | Status: DC
  Administered 2024-02-22 – 2024-02-25 (×6): 2.5 mg via ORAL
  Filled 2024-02-22 (×6): qty 1

## 2024-02-22 MED ORDER — LUBIPROSTONE 24 MCG PO CAPS
24.0000 ug | ORAL_CAPSULE | Freq: Two times a day (BID) | ORAL | Status: DC
  Administered 2024-02-23 – 2024-02-25 (×5): 24 ug via ORAL
  Filled 2024-02-22 (×6): qty 1

## 2024-02-22 MED ORDER — TIMOLOL MALEATE 0.5 % OP SOLN
1.0000 [drp] | Freq: Two times a day (BID) | OPHTHALMIC | Status: DC
  Administered 2024-02-22 – 2024-02-25 (×6): 1 [drp] via OPHTHALMIC
  Filled 2024-02-22 (×2): qty 5

## 2024-02-22 MED ORDER — SODIUM CHLORIDE 0.9 % IV SOLN
1.0000 g | Freq: Once | INTRAVENOUS | Status: AC
  Administered 2024-02-22: 1 g via INTRAVENOUS
  Filled 2024-02-22: qty 10

## 2024-02-22 MED ORDER — LACTATED RINGERS IV BOLUS
500.0000 mL | Freq: Once | INTRAVENOUS | Status: AC
Start: 2024-02-22 — End: 2024-02-22
  Administered 2024-02-22: 500 mL via INTRAVENOUS

## 2024-02-22 MED ORDER — ACETAMINOPHEN 650 MG RE SUPP: 650.0000 mg | Freq: Four times a day (QID) | RECTAL | Status: DC | PRN

## 2024-02-22 MED ORDER — ALBUTEROL SULFATE (2.5 MG/3ML) 0.083% IN NEBU: 2.5000 mg | INHALATION_SOLUTION | Freq: Four times a day (QID) | RESPIRATORY_TRACT | Status: DC | PRN

## 2024-02-22 MED ORDER — NITROGLYCERIN 0.4 MG SL SUBL: 0.4000 mg | SUBLINGUAL_TABLET | SUBLINGUAL | Status: DC | PRN

## 2024-02-22 NOTE — Plan of Care (Signed)
  Problem: Clinical Measurements: Goal: Cardiovascular complication will be avoided Outcome: Progressing   Problem: Coping: Goal: Level of anxiety will decrease Outcome: Progressing   Problem: Elimination: Goal: Will not experience complications related to urinary retention Outcome: Progressing   Problem: Pain Managment: Goal: General experience of comfort will improve and/or be controlled Outcome: Progressing   Problem: Safety: Goal: Ability to remain free from injury will improve Outcome: Progressing   Problem: Skin Integrity: Goal: Risk for impaired skin integrity will decrease Outcome: Progressing

## 2024-02-22 NOTE — H&P (Signed)
 [facility] H&P   Patient Demographics:    Patricia Jackson, is a 83 y.o. female  MRN: [id number]   DOB - 11-May-1940  Admit Date - 02/22/2024  Outpatient Primary MD for the patient is Shona Norleen PEDLAR, MD  Referring MD/NP/PA: PA Celeste  Patient coming from: home  Chief Complaint  Patient presents with   Back Pain   Hypotension      HPI:    Patricia Jackson  is a 83 y.o. female,  with medical history significant of hypertension, hyperlipidemia, hypothyroidism, atrial fibrillation on chronic anticoagulation, COPD,  chronic kidney disease, anemia of chronic kidney disease and chronic GI bleed who presents to the emergency department with his, polyuria, poor appetite and back pain, patient with recent hospitalization discharge 01/28/2024, with pneumonia and pyelonephritis, patient reports she was in her usual state of health, to have intermittent pain across her lower back and for the past 2 days she has been feeling worse, she does report cough, congestion with yellow phlegm, she does report polyuria, denies neck pain, stiffness,. - In ED she had elevated lactic acid 2.9, fever 103.5, initially hypotensive but responded to fluid bolus, creatinine is up to 1.65 from baseline of 1.1 to, CT chest/abdomen/pelvis with no acute findings, chest x-ray with no evidence of pneumonia, UA positive for UTI, started on Rocephin  and [facility]  hospitalist consulted to admit   Review of systems:      A full 10 point Review of Systems was done, except as stated above, all other Review of Systems were negative.   With Past History of the following :    Past Medical History:  Diagnosis Date   A-fib [facility])    Atrial flutter (HCC)    On Eliquis  in 8/17 but discontinued after stent placement   Breast cancer (HCC)    remote   CAD in native artery 03/16/2016   COPD (chronic obstructive pulmonary disease) (HCC)     Dysrhythmia    Hypertension    Iron  deficiency anemia 06/12/2021   NSTEMI (non-ST elevated myocardial infarction) (HCC)    2017   S/P angioplasty with stent 03/15/16 DES Resolute, pLCX 03/16/2016   Vitreous hemorrhage of left eye (HCC) 08/01/2019      Past Surgical History:  Procedure Laterality Date   APPENDECTOMY     BREAST SURGERY Left    CARDIAC CATHETERIZATION N/A 03/15/2016   Procedure: Left Heart Cath and Coronary Angiography;  Surgeon: Victory LELON Sharps, MD;  Location: [facility] INVASIVE CV LAB;  Service: Cardiovascular;  Laterality: N/A;   CARDIAC CATHETERIZATION N/A 03/15/2016   Procedure: Coronary Stent Intervention;  Surgeon: Victory LELON Sharps, MD;  Location: [facility] INVASIVE CV LAB;  Service: Cardiovascular;  Laterality: N/A;   CARDIOVERSION N/A 02/04/2023   Procedure: CARDIOVERSION;  Surgeon: Stacia Diannah SQUIBB, MD;  Location: [facility] ORS;  Service: Cardiovascular;  Laterality: N/A;   COLONOSCOPY     remote  COLONOSCOPY N/A 05/26/2016   Procedure: COLONOSCOPY;  Surgeon: Lamar CHRISTELLA Hollingshead, MD;  Location: [facility];  Service: Endoscopy;  Laterality: N/A;  845   COLONOSCOPY N/A 06/17/2023   Procedure: COLONOSCOPY;  Surgeon: Hollingshead Lamar CHRISTELLA, MD;  Location: [facility];  Service: Endoscopy;  Laterality: N/A;   ENTEROSCOPY N/A 09/06/2023   Procedure: ENTEROSCOPY;  Surgeon: Eartha Angelia Sieving, MD;  Location: [facility];  Service: Gastroenterology;  Laterality: N/A;   ESOPHAGOGASTRODUODENOSCOPY N/A 05/26/2016   Procedure: ESOPHAGOGASTRODUODENOSCOPY (EGD);  Surgeon: Lamar CHRISTELLA Hollingshead, MD;  [facility]: [facility];  Service: Endoscopy;  Laterality: N/A;   ESOPHAGOGASTRODUODENOSCOPY N/A 06/16/2023   Procedure: EGD (ESOPHAGOGASTRODUODENOSCOPY);  Surgeon: Cinderella Deatrice FALCON, MD;  Location: [facility];  Service: Endoscopy;  Laterality: N/A;   ESOPHAGOGASTRODUODENOSCOPY N/A 09/06/2023   Procedure: EGD (ESOPHAGOGASTRODUODENOSCOPY);  Surgeon: Eartha Angelia, Sieving, MD;  Location: [facility];   Service: Gastroenterology;  Laterality: N/A;  with possible dilation   ESOPHAGOGASTRODUODENOSCOPY (EGD) WITH PROPOFOL  N/A 11/22/2022   Procedure: ESOPHAGOGASTRODUODENOSCOPY (EGD) WITH PROPOFOL ;  Surgeon: Cindie Carlin POUR, DO;  Location: [facility];  Service: Endoscopy;  Laterality: N/A;   EYE SURGERY     EYE SURGERY Left    Dr. Loretha   HEMOSTASIS CLIP PLACEMENT  06/17/2023   Procedure: CONTROL OF HEMORRHAGE, GI TRACT, ENDOSCOPIC, BY CLIPPING OR OVERSEWING;  Surgeon: Hollingshead Lamar CHRISTELLA, MD;  Location: [facility];  Service: Endoscopy;;   HOT HEMOSTASIS  11/22/2022   Procedure: HOT HEMOSTASIS (ARGON PLASMA COAGULATION/BICAP);  Surgeon: Cindie Carlin POUR, DO;  Location: [facility];  Service: Endoscopy;;   HOT HEMOSTASIS  06/16/2023   Procedure: EGD, WITH ARGON PLASMA COAGULATION;  Surgeon: Cinderella Deatrice FALCON, MD;  Location: [facility];  Service: Endoscopy;;   MALONEY DILATION N/A 05/26/2016   Procedure: AGAPITO DILATION;  Surgeon: Lamar CHRISTELLA Hollingshead, MD;  [facility]: [facility];  Service: Endoscopy;  Laterality: N/A;   POLYPECTOMY  06/17/2023   Procedure: POLYPECTOMY;  Surgeon: Hollingshead Lamar CHRISTELLA, MD;  [facility];  Service: Endoscopy;;   SCLEROTHERAPY  06/17/2023   Procedure: MATIAS;  Surgeon: Hollingshead Lamar CHRISTELLA, MD;  [facility];  Service: Endoscopy;;   TEE WITHOUT CARDIOVERSION N/A 02/04/2023   Procedure: TRANSESOPHAGEAL ECHOCARDIOGRAM (TEE);  Surgeon: Mallipeddi, Vishnu P, MD;  Location: [facility];  Service: Cardiovascular;  Laterality: N/A;   TOOTH EXTRACTION N/A 01/15/2022   Procedure: DENTAL RESTORATION/EXTRACTIONS;  Surgeon: Sheryle Hamilton, DMD;  Location: [facility] OR;  Service: Oral Surgery;  Laterality: N/A;   VITRECTOMY Left 10/03/2019   Dr. Elner, Vitrectomy, Focal Laser, Removal of Silicone Oil      Social History:     Social History   Tobacco Use   Smoking status: Former    Current packs/day: 0.00    Types: Cigarettes    Quit date: 12/28/2015    Years  since quitting: 8.1    Passive exposure: Current   Smokeless tobacco: Never   Tobacco comments:    Patient quit within the last 10 years  Substance Use Topics   Alcohol use: Not Currently        Family History :     Family History  Problem Relation Age of Onset   Heart disease Mother    COPD Father    Cancer Sister        unknown primary   Cancer Brother        unknown primary   Cancer Brother    Hypertension Son  Colon cancer Neg Hx       Home Medications:   Prior to Admission medications   Medication Sig Start Date End Date Taking? Authorizing Provider  acetaminophen  (TYLENOL ) 500 MG tablet Take 1,000 mg by mouth every 8 (eight) hours as needed for mild pain or moderate pain. 03/21/17  Yes [provider]  albuterol  (PROVENTIL ) (2.5 MG/3ML) 0.083% nebulizer solution Take 2.5 mg by nebulization every 6 (six) hours as needed for shortness of breath or wheezing.   Yes [provider]  albuterol  (VENTOLIN  HFA) 108 (90 Base) MCG/ACT inhaler Inhale 2 puffs into the lungs every 4 (four) hours as needed for wheezing or shortness of breath. 10/14/23  Yes [provider]  amiodarone  (PACERONE ) 200 MG tablet Take 1 tablet (200 mg total) by mouth daily. Take 1 Tablet ( 200 mg ) Daily and none on Sunday 01/31/24  Yes Johnson, Clanford L, MD  amLODipine  (NORVASC ) 5 MG tablet Take 5 mg by mouth daily. 08/15/23  Yes [provider]  apixaban  (ELIQUIS ) 2.5 MG TABS tablet Take 1 tablet (2.5 mg total) by mouth 2 (two) times daily. 10/25/23  Yes BranchDorn FALCON, MD  ferrous sulfate  324 MG TBEC Take 324 mg by mouth daily with breakfast.   Yes [provider]  levothyroxine  (SYNTHROID ) 75 MCG tablet Take 75 mcg by mouth daily before breakfast. 10/20/23  Yes [provider]  losartan (COZAAR) 25 MG tablet Take 25 mg by mouth at bedtime.   Yes [provider]  lubiprostone  (AMITIZA ) 24 MCG capsule Take 1 capsule (24 mcg total) by  mouth 2 (two) times daily with a meal. 12/29/23  Yes Shirlean Therisa ORN, NP  nitroGLYCERIN  (NITROSTAT ) 0.4 MG SL tablet Place 1 tablet (0.4 mg total) under the tongue every 5 (five) minutes as needed for chest pain. 01/31/24  Yes Johnson, Clanford L, MD  ondansetron  (ZOFRAN -ODT) 4 MG disintegrating tablet Take 4 mg by mouth 2 (two) times daily.   Yes [provider]  pantoprazole  (PROTONIX ) 40 MG tablet Take 1 tablet (40 mg total) by mouth 2 (two) times daily. 09/12/23 02/22/24 Yes Shah, Pratik D, DO  rosuvastatin  (CRESTOR ) 20 MG tablet Take 20 mg by mouth daily. 02/26/17  Yes [provider]  sertraline  (ZOLOFT ) 25 MG tablet Take 25 mg by mouth daily.   Yes [provider]  timolol  (TIMOPTIC ) 0.5 % ophthalmic solution Place 1 drop into the right eye 2 (two) times daily.   Yes [provider]  traZODone  (DESYREL ) 50 MG tablet Take 50 mg by mouth at bedtime. 08/31/23  Yes [provider]  DOMINIC BECK 200-62.5-25 MCG/ACT AEPB Inhale 1 puff into the lungs daily. 11/02/22  Yes [provider]     Allergies:     Allergies  Allergen Reactions   Amoxicillin -Pot Clavulanate Diarrhea   Povidone-Iodine Other (See Comments)    Burning   Sulfa Antibiotics Other (See Comments)    Unknown    Sulfamethoxazole Other (See Comments)    Unknown      Physical Exam:   Vitals  Blood pressure (!) 125/55, pulse 86, temperature (!) 103.5 F (39.7 C), temperature source Rectal, resp. rate 16, height 5' 8 (1.727 m), weight 59 kg, SpO2 93%.   1. General Frail, chronically appearing female, laying in bed in mild discomfort  2. Normal affect and insight, Not Suicidal or Homicidal, Awake Alert, Oriented X 3.  3. No F.N deficits, ALL C.Nerves Intact, Strength 5/5 all 4 extremities, Sensation intact all 4  extremities, Plantars down going.  4. Ears and Eyes appear Normal, Conjunctivae clear, PERRLA. Moist Oral Mucosa.  5. Supple Neck, No JVD, No cervical  lymphadenopathy appriciated, No Carotid Bruits.  6. Symmetrical Chest wall movement, Good air movement bilaterally, CTAB.  7.  Tachycardic, No Gallops, Rubs or Murmurs, No Parasternal Heave.  8. Positive Bowel Sounds, Abdomen Soft, No tenderness, No organomegaly appriciated,No rebound -guarding or rigidity.  9.  No Cyanosis, Normal Skin Turgor, No Skin Rash or Bruise.  10. Good muscle tone,  joints appear normal , no effusions, Normal ROM.     Data Review:    CBC Recent Labs  Lab 02/22/24 1250  WBC 8.4  HGB 10.3*  HCT 33.2*  PLT 81*  MCV 102.5*  MCH 31.8  MCHC 31.0  RDW 18.6*  LYMPHSABS 0.3*  MONOABS 0.7  EOSABS 0.0  BASOSABS 0.0   ------------------------------------------------------------------------------------------------------------------  Chemistries  Recent Labs  Lab 02/22/24 1250  NA 138  K 4.2  CL 105  CO2 21*  GLUCOSE 127*  BUN 27*  CREATININE 1.65*  CALCIUM  9.0  AST 39  ALT 18  ALKPHOS 71  BILITOT 0.6   ------------------------------------------------------------------------------------------------------------------ estimated creatinine clearance is 25.3 mL/min (A) (by C-G formula based on SCr of 1.65 mg/dL (H)). ------------------------------------------------------------------------------------------------------------------ No results for input(s): TSH, T4TOTAL, T3FREE, THYROIDAB in the last 72 hours.  Invalid input(s): FREET3  Coagulation profile No results for input(s): INR, PROTIME in the last 168 hours. ------------------------------------------------------------------------------------------------------------------- No results for input(s): DDIMER in the last 72 hours. -------------------------------------------------------------------------------------------------------------------  Cardiac Enzymes No results for input(s): CKMB, TROPONINI, MYOGLOBIN in the last 168 hours.  Invalid input(s):  CK ------------------------------------------------------------------------------------------------------------------    Component Value Date/Time   BNP 151.0 (H) 12/05/2023 1422     ---------------------------------------------------------------------------------------------------------------  Urinalysis    Component Value Date/Time   COLORURINE YELLOW 02/22/2024 1618   APPEARANCEUR HAZY (A) 02/22/2024 1618   LABSPEC 1.005 02/22/2024 1618   PHURINE 6.0 02/22/2024 1618   GLUCOSEU NEGATIVE 02/22/2024 1618   HGBUR SMALL (A) 02/22/2024 1618   BILIRUBINUR NEGATIVE 02/22/2024 1618   KETONESUR NEGATIVE 02/22/2024 1618   PROTEINUR NEGATIVE 02/22/2024 1618   NITRITE NEGATIVE 02/22/2024 1618   LEUKOCYTESUR MODERATE (A) 02/22/2024 1618    ----------------------------------------------------------------------------------------------------------------   Imaging Results:    DG Chest Portable 1 View Result Date: 02/22/2024 CLINICAL DATA:  Intermittent lower to mid back pain. EXAM: PORTABLE CHEST 1 VIEW COMPARISON:  January 27, 2024 FINDINGS: The heart size and mediastinal contours are within normal limits. There is marked severity calcification of the thoracic aorta. The lungs are hyperinflated. Moderate severity diffuse, chronic appearing increased interstitial lung markings are seen. Mild areas of scarring and/or atelectasis are noted within the bilateral lung bases. No pleural effusion or pneumothorax is identified. The visualized skeletal structures are unremarkable. IMPRESSION: 1. COPD with chronic appearing increased interstitial lung markings. 2. Mild bibasilar scarring and/or atelectasis. Electronically Signed   By: Suzen Dials M.D.   On: 02/22/2024 19:34   CT L-SPINE NO CHARGE Result Date: 02/22/2024 CLINICAL DATA:  Low back pain for 2 weeks. EXAM: CT LUMBAR SPINE WITHOUT CONTRAST TECHNIQUE: Multidetector CT imaging of the lumbar spine was performed without intravenous contrast  administration. Multiplanar CT image reconstructions were also generated. RADIATION DOSE REDUCTION: This exam was performed according to the departmental dose-optimization program which includes automated exposure control, adjustment of the mA and/or kV according to patient size and/or use of iterative reconstruction technique. COMPARISON:  CT scan 05/09/2022 FINDINGS: Segmentation: There are five lumbar type vertebral bodies.  The last full intervertebral disc space is labeled L5-S1. Alignment: Normal Vertebrae: Age related osteoporosis but no bone lesions or fractures. Paraspinal and other soft tissues: Advanced aortic and branch vessel calcifications but no aneurysm. No retroperitoneal adenopathy or mass. No significant paraspinal findings. Disc levels: T12-L1: No significant findings. L1-2: No significant findings. L2-3: Broad-based bulging degenerated annulus, osteophytic ridging, facet disease and ligamentum flavum thickening all contributing to severe spinal and bilateral lateral recess stenosis which appears relatively stable. Mild bilateral foraminal stenosis is also stable. L3-4: Diffuse bulging annulus, facet disease and ligamentum flavum thickening contributing to moderate to moderately severe spinal and bilateral lateral recess stenosis, unchanged. No foraminal stenosis. L4-5: Bulging annulus, facet disease and ligamentum flavum thickening contributing to moderate spinal and bilateral lateral recess stenosis. No foraminal stenosis. L5-S1: Facet disease but no disc protrusions, significant spinal or foraminal stenosis. IMPRESSION: 1. Age related osteoporosis but no bone lesions or fractures. 2. 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 No significant change. 3. Moderate to moderately severe multifactorial spinal and bilateral lateral recess stenosis at L3-4, unchanged. 4. Moderate spinal and bilateral lateral recess stenosis at L4-5, unchanged. 5. Aortic atherosclerosis.  Aortic Atherosclerosis (ICD10-I70.0). Electronically Signed   By: MYRTIS Stammer M.D.   On: 02/22/2024 15:43   CT ABDOMEN PELVIS WO CONTRAST Result Date: 02/22/2024 CLINICAL DATA:  Abdominal and back pain for 2 weeks. EXAM: CT ABDOMEN AND PELVIS WITHOUT CONTRAST TECHNIQUE: Multidetector CT imaging of the abdomen and pelvis was performed following the standard protocol without IV contrast. RADIATION DOSE REDUCTION: This exam was performed according to the departmental dose-optimization program which includes automated exposure control, adjustment of the mA and/or kV according to patient size and/or use of iterative reconstruction technique. COMPARISON:  01/27/2024 FINDINGS: Lower chest: Stable aortic atherosclerotic calcifications. The lung bases are clear of an acute process. Stable basilar scarring changes. Hepatobiliary: No hepatic lesions or intrahepatic biliary dilatation. Suspect small gallstones in the gallbladder. No findings for acute cholecystitis. No common bile duct dilatation. Pancreas: No mass, inflammation or ductal dilatation. Age related pancreatic atrophy. Spleen: Normal size.  No focal lesions. Adrenals/Urinary Tract: The adrenal glands are normal. Stable left renal cysts. No worrisome renal lesions are identified without contrast. No renal calculi or obstructing ureteral calculi. No bladder mass or bladder calculi. Mild cystocele noted. Stomach/Bowel: The stomach, duodenum, small bowel and colon are grossly normal without oral contrast. No acute inflammatory process, mass lesions or obstructive findings. Stable colonic diverticulosis. Vascular/Lymphatic: Stable advanced atherosclerotic calcification involving the aorta and branch vessels but no aneurysm. No mesenteric or retroperitoneal mass or adenopathy. Reproductive: The uterus is unremarkable.  No adnexal mass. Other: No pelvic mass or adenopathy. No free pelvic fluid collections. No inguinal mass or adenopathy. No abdominal wall hernia or  subcutaneous lesions. Musculoskeletal: No significant bony findings. No lumbar compression fracture. Age related osteopenia. IMPRESSION: 1. No acute abdominal/pelvic findings, mass lesions or adenopathy. 2. No renal, ureteral or bladder calculi or mass. 3. Stable left renal cysts. 4. Suspect small gallstones in the gallbladder. No findings for acute cholecystitis. 5. Stable colonic diverticulosis without findings for acute diverticulitis. 6. Stable advanced atherosclerotic calcification involving the aorta and branch vessels. 7. Aortic atherosclerosis. Aortic Atherosclerosis (ICD10-I70.0). Electronically Signed   By: MYRTIS Stammer M.D.   On: 02/22/2024 15:33     EKG:  Vent. rate 118 BPM PR interval 102 ms QRS duration 105 ms QT/QTcB 320/449 ms P-R-T axes 17 89 91 Sinus tachycardia Borderline right axis deviation Minimal ST  depression, anterolateral leads Artifact in lead(s) I II III aVR aVL aVF V1 V2 and baseline    Assessment & Plan:    Principal Problem:   Sepsis (HCC) Active Problems:   Essential hypertension   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HCC)   Mixed hyperlipidemia   S/P angioplasty with stent 03/15/16 DES Resolute, pLCX   Paroxysmal atrial fibrillation (HCC)   Chronic kidney disease, stage 4 (severe) (HCC)   Sepsis, POA UTI - sepsis, POA, fever 103.5, hypotensive 78/44, tachycardic and tachypneic with elevated lactic acid - No acute finding on CT chest/abdomen/pelvis, x-ray with no evidence of pneumonia - As positive, she does report urinary symptoms - Continue with IV Rocephin , follow on urine cultures and blood cultures - She is with recent hospitalization for acute pyelonephritis, even though urine cultures were negative then, we will need prolonged antibiotic course treatment for complicated UTI (7 days) - She does report some respiratory symptoms including cough and congestion, well check respiratory viral panel  Acute kidney injury superimposed on stage IV CKD - Secondary to  dehydration and volume depletion - Continue with IV fluids  Atrial fibrillation Continue with amiodarone  and Eliquis    Hypertension -Hold amlodipine  and losartan as blood pressure soft on presentation  Hyperlipidemia - Continue with home statin    COPD No wheezing, continue with home meds and as needed albuterol    Anemia of chronic disease Macrocytic anemia -Will check B12 and folic acid    Thrombocytopenia -this appears to be chronic, but platelet lower than baseline this admission, most likely in the setting of sepsis    DVT Prophylaxis resume home Eliquis   AM Labs Ordered, also please review Full Orders  Family Communication: Admission, patients condition and plan of care including tests being ordered have been discussed with the patient and daughter-in-law* who indicate understanding and agree with the plan and Code Status.  Code Status full code  Likely DC to home  Condition GUARDED  Consults called: None  Admission status: Inpatient  Time spent in minutes : 75 minutes   Brayton Lye M.D on 02/22/2024 at 8:49 PM   [facility] - Office  [phone]

## 2024-02-22 NOTE — ED Triage Notes (Signed)
 Pt with lower to mid back pain across off and on for the past few days, worse today. HA x 3 hrs.

## 2024-02-22 NOTE — ED Provider Notes (Signed)
 [place] EMERGENCY DEPARTMENT AT [facility] Provider Note   CSN: [id number] Arrival date & time: 02/22/24  1124     Patient presents with: Back Pain and Hypotension   Patricia Jackson is a 83 y.o. female who with a history including COPD, atrial flutter, CAD with history of non-STEMI, hypertension, iron  deficiency anemia who was admitted to this hospital several weeks ago where she was treated for acute pyelonephritis and community-acquired pneumonia.  She was feeling well until she started having intermittent pain across her lower back over the past 2 days which is worse today.  Pain is worse with movement but also occurs at rest.  She also reports generalized headache today.  She denies neck pain or stiffness, she has had no fevers or chills, she also denies dizziness, lightheadedness, she does have some mild pain in her bilateral lower abdomen but the majority of her pain is in her lower back.  She has had no treatment for symptoms prior to arrival.   The history is provided by the patient and a relative.       Prior to Admission medications   Medication Sig Start Date End Date Taking? Authorizing Provider  acetaminophen  (TYLENOL ) 500 MG tablet Take 1,000 mg by mouth every 8 (eight) hours as needed for mild pain or moderate pain. 03/21/17  Yes [provider]  albuterol  (PROVENTIL ) (2.5 MG/3ML) 0.083% nebulizer solution Take 2.5 mg by nebulization every 6 (six) hours as needed for shortness of breath or wheezing.   Yes [provider]  albuterol  (VENTOLIN  HFA) 108 (90 Base) MCG/ACT inhaler Inhale 2 puffs into the lungs every 4 (four) hours as needed for wheezing or shortness of breath. 10/14/23  Yes [provider]  amiodarone  (PACERONE ) 200 MG tablet Take 1 tablet (200 mg total) by mouth daily. Take 1 Tablet ( 200 mg ) Daily and none on Sunday 01/31/24  Yes Johnson, Clanford L, MD  amLODipine  (NORVASC ) 5 MG tablet Take 5 mg by mouth daily. 08/15/23   Yes [provider]  apixaban  (ELIQUIS ) 2.5 MG TABS tablet Take 1 tablet (2.5 mg total) by mouth 2 (two) times daily. 10/25/23  Yes BranchDorn FALCON, MD  ferrous sulfate  324 MG TBEC Take 324 mg by mouth daily with breakfast.   Yes [provider]  levothyroxine  (SYNTHROID ) 75 MCG tablet Take 75 mcg by mouth daily before breakfast. 10/20/23  Yes [provider]  losartan (COZAAR) 25 MG tablet Take 25 mg by mouth at bedtime.   Yes [provider]  lubiprostone  (AMITIZA ) 24 MCG capsule Take 1 capsule (24 mcg total) by mouth 2 (two) times daily with a meal. 12/29/23  Yes Shirlean Therisa ORN, NP  nitroGLYCERIN  (NITROSTAT ) 0.4 MG SL tablet Place 1 tablet (0.4 mg total) under the tongue every 5 (five) minutes as needed for chest pain. 01/31/24  Yes Johnson, Clanford L, MD  ondansetron  (ZOFRAN -ODT) 4 MG disintegrating tablet Take 4 mg by mouth 2 (two) times daily.   Yes [provider]  pantoprazole  (PROTONIX ) 40 MG tablet Take 1 tablet (40 mg total) by mouth 2 (two) times daily. 09/12/23 02/22/24 Yes Shah, Pratik D, DO  rosuvastatin  (CRESTOR ) 20 MG tablet Take 20 mg by mouth daily. 02/26/17  Yes [provider]  sertraline  (ZOLOFT ) 25 MG tablet Take 25 mg by mouth daily.   Yes [provider]  timolol (TIMOPTIC) 0.5 % ophthalmic solution Place 1 drop into the right eye 2 (two) times daily.   Yes [provider]  traZODone  (DESYREL ) 50 MG tablet Take 50 mg by mouth at bedtime. 08/31/23  Yes [provider]  DOMINIC BECK 200-62.5-25 MCG/ACT AEPB Inhale 1 puff into the lungs daily. 11/02/22  Yes [provider]    Allergies: Amoxicillin -pot clavulanate, Povidone-iodine, Sulfa antibiotics, and Sulfamethoxazole    Review of Systems  Constitutional:  Negative for chills and fever.  HENT:  Negative for congestion and sore throat.   Eyes: Negative.   Respiratory:  Negative for chest tightness and shortness of breath.    Cardiovascular:  Negative for chest pain.  Gastrointestinal:  Positive for abdominal pain. Negative for diarrhea, nausea and vomiting.  Genitourinary: Negative.  Negative for dysuria.  Musculoskeletal:  Positive for back pain. Negative for arthralgias, joint swelling and neck pain.  Skin: Negative.  Negative for rash and wound.  Neurological:  Positive for headaches. Negative for dizziness, weakness, light-headedness and numbness.  Psychiatric/Behavioral: Negative.      Updated Vital Signs BP 110/88   Pulse 92   Temp 98.4 F (36.9 C) (Oral)   Resp 19   Ht 5' 8 (1.727 m)   Wt 59 kg   SpO2 92%   BMI 19.77 kg/m   Physical Exam Vitals and nursing note reviewed.  Constitutional:      Appearance: She is well-developed.  HENT:     Head: Normocephalic and atraumatic.  Eyes:     Conjunctiva/sclera: Conjunctivae normal.  Cardiovascular:     Rate and Rhythm: Normal rate and regular rhythm.     Heart sounds: Normal heart sounds.  Pulmonary:     Effort: Pulmonary effort is normal.     Breath sounds: Normal breath sounds. No wheezing.  Abdominal:     General: Bowel sounds are normal. There is no distension.     Palpations: Abdomen is soft. There is no mass.     Tenderness: There is abdominal tenderness. There is no guarding.     Comments: Mild tenderness to palpation bilateral lower abdomen, no guarding or rebound.  No suprapubic tenderness, no pulsatile abdominal masses.  Musculoskeletal:        General: Normal range of motion.     Cervical back: Normal range of motion.  Skin:    General: Skin is warm and dry.  Neurological:     Mental Status: She is alert.     (all labs ordered are listed, but only abnormal results are displayed) Labs Reviewed  CBC WITH DIFFERENTIAL/PLATELET - Abnormal; Notable for the following components:      Result Value   RBC 3.24 (*)    Hemoglobin 10.3 (*)    HCT 33.2 (*)    MCV 102.5 (*)    RDW 18.6 (*)    Platelets 81 (*)    Lymphs Abs 0.3  (*)    All other components within normal limits  COMPREHENSIVE METABOLIC PANEL WITH GFR - Abnormal; Notable for the following components:   CO2 21 (*)    Glucose, Bld 127 (*)    BUN 27 (*)    Creatinine, Ser 1.65 (*)    GFR, Estimated 31 (*)    All other components within normal limits  LIPASE, BLOOD - Abnormal; Notable for the following components:   Lipase 56 (*)    All other components within normal limits  URINALYSIS, ROUTINE W REFLEX MICROSCOPIC - Abnormal; Notable for the following components:   APPearance HAZY (*)    Hgb urine dipstick SMALL (*)    Leukocytes,Ua MODERATE (*)  Bacteria, UA FEW (*)    All other components within normal limits  RESP PANEL BY RT-PCR (RSV, FLU A&B, COVID)  RVPGX2  CULTURE, BLOOD (ROUTINE X 2)  CULTURE, BLOOD (ROUTINE X 2)  LACTIC ACID, PLASMA  LACTIC ACID, PLASMA    EKG: None  Radiology: DG Chest Portable 1 View Result Date: 02/22/2024 CLINICAL DATA:  Intermittent lower to mid back pain. EXAM: PORTABLE CHEST 1 VIEW COMPARISON:  January 27, 2024 FINDINGS: The heart size and mediastinal contours are within normal limits. There is marked severity calcification of the thoracic aorta. The lungs are hyperinflated. Moderate severity diffuse, chronic appearing increased interstitial lung markings are seen. Mild areas of scarring and/or atelectasis are noted within the bilateral lung bases. No pleural effusion or pneumothorax is identified. The visualized skeletal structures are unremarkable. IMPRESSION: 1. COPD with chronic appearing increased interstitial lung markings. 2. Mild bibasilar scarring and/or atelectasis. Electronically Signed   By: Suzen Dials M.D.   On: 02/22/2024 19:34   CT L-SPINE NO CHARGE Result Date: 02/22/2024 CLINICAL DATA:  Low back pain for 2 weeks. EXAM: CT LUMBAR SPINE WITHOUT CONTRAST TECHNIQUE: Multidetector CT imaging of the lumbar spine was performed without intravenous contrast administration. Multiplanar CT image  reconstructions were also generated. RADIATION DOSE REDUCTION: This exam was performed according to the departmental dose-optimization program which includes automated exposure control, adjustment of the mA and/or kV according to patient size and/or use of iterative reconstruction technique. COMPARISON:  CT scan 05/09/2022 FINDINGS: Segmentation: There are five lumbar type vertebral bodies. The last full intervertebral disc space is labeled L5-S1. Alignment: Normal Vertebrae: Age related osteoporosis but no bone lesions or fractures. Paraspinal and other soft tissues: Advanced aortic and branch vessel calcifications but no aneurysm. No retroperitoneal adenopathy or mass. No significant paraspinal findings. Disc levels: T12-L1: No significant findings. L1-2: No significant findings. L2-3: Broad-based bulging degenerated annulus, osteophytic ridging, facet disease and ligamentum flavum thickening all contributing to severe spinal and bilateral lateral recess stenosis which appears relatively stable. Mild bilateral foraminal stenosis is also stable. L3-4: Diffuse bulging annulus, facet disease and ligamentum flavum thickening contributing to moderate to moderately severe spinal and bilateral lateral recess stenosis, unchanged. No foraminal stenosis. L4-5: Bulging annulus, facet disease and ligamentum flavum thickening contributing to moderate spinal and bilateral lateral recess stenosis. No foraminal stenosis. L5-S1: Facet disease but no disc protrusions, significant spinal or foraminal stenosis. IMPRESSION: 1. Age related osteoporosis but no bone lesions or fractures. 2. 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 No significant change. 3. Moderate to moderately severe multifactorial spinal and bilateral lateral recess stenosis at L3-4, unchanged. 4. Moderate spinal and bilateral lateral recess stenosis at L4-5, unchanged. 5. Aortic atherosclerosis. Aortic Atherosclerosis (ICD10-I70.0).  Electronically Signed   By: MYRTIS Stammer M.D.   On: 02/22/2024 15:43   CT ABDOMEN PELVIS WO CONTRAST Result Date: 02/22/2024 CLINICAL DATA:  Abdominal and back pain for 2 weeks. EXAM: CT ABDOMEN AND PELVIS WITHOUT CONTRAST TECHNIQUE: Multidetector CT imaging of the abdomen and pelvis was performed following the standard protocol without IV contrast. RADIATION DOSE REDUCTION: This exam was performed according to the departmental dose-optimization program which includes automated exposure control, adjustment of the mA and/or kV according to patient size and/or use of iterative reconstruction technique. COMPARISON:  01/27/2024 FINDINGS: Lower chest: Stable aortic atherosclerotic calcifications. The lung bases are clear of an acute process. Stable basilar scarring changes. Hepatobiliary: No hepatic lesions or intrahepatic biliary dilatation. Suspect small gallstones in the gallbladder. No findings  for acute cholecystitis. No common bile duct dilatation. Pancreas: No mass, inflammation or ductal dilatation. Age related pancreatic atrophy. Spleen: Normal size.  No focal lesions. Adrenals/Urinary Tract: The adrenal glands are normal. Stable left renal cysts. No worrisome renal lesions are identified without contrast. No renal calculi or obstructing ureteral calculi. No bladder mass or bladder calculi. Mild cystocele noted. Stomach/Bowel: The stomach, duodenum, small bowel and colon are grossly normal without oral contrast. No acute inflammatory process, mass lesions or obstructive findings. Stable colonic diverticulosis. Vascular/Lymphatic: Stable advanced atherosclerotic calcification involving the aorta and branch vessels but no aneurysm. No mesenteric or retroperitoneal mass or adenopathy. Reproductive: The uterus is unremarkable.  No adnexal mass. Other: No pelvic mass or adenopathy. No free pelvic fluid collections. No inguinal mass or adenopathy. No abdominal wall hernia or subcutaneous lesions. Musculoskeletal:  No significant bony findings. No lumbar compression fracture. Age related osteopenia. IMPRESSION: 1. No acute abdominal/pelvic findings, mass lesions or adenopathy. 2. No renal, ureteral or bladder calculi or mass. 3. Stable left renal cysts. 4. Suspect small gallstones in the gallbladder. No findings for acute cholecystitis. 5. Stable colonic diverticulosis without findings for acute diverticulitis. 6. Stable advanced atherosclerotic calcification involving the aorta and branch vessels. 7. Aortic atherosclerosis. Aortic Atherosclerosis (ICD10-I70.0). Electronically Signed   By: MYRTIS Stammer M.D.   On: 02/22/2024 15:33     Procedures   Medications Ordered in the ED  cefTRIAXone  (ROCEPHIN ) 1 g in sodium chloride  0.9 % 100 mL IVPB (has no administration in time range)  sodium chloride  0.9 % bolus 500 mL (0 mLs Intravenous Stopped 02/22/24 1509)  sodium chloride  0.9 % bolus 1,000 mL (0 mLs Intravenous Stopped 02/22/24 1836)  ondansetron  (ZOFRAN ) injection 4 mg (4 mg Intravenous Given 02/22/24 1839)    Clinical Course as of 02/22/24 1938  Wed Feb 22, 2024  1837 Bed recheck after labs and CT imaging resulted, patient had new complaints of shaking chills nausea with emesis x 2, she  states her nausea started about an hour ago, the chills started about 10 minutes ago.  She is tachycardic at 120 on the monitor, borderline hypoxic at 92% on room air.  She is hypertensive at this time.  Zofran , chest x-ray added to workup. [JI]    Clinical Course User Index [JI] Birdena Clarity, PA-C                                 Medical Decision Making Patient with a recent hospitalization for community-acquired pneumonia and presumptive pyelonephritis although her urine culture did not reflect a urinary infection presenting today with a several day history of low back pain.  On exam she did have some tenderness in her bilateral lower abdomen, no distention, no mass.  She also reports having generalized headache today  as well.  She has had poor p.o. intake today.  Denies nausea or vomiting, denies fevers, no dysuria, no abdominal distention, also denies chest pain or shortness of breath.  Initially she was hypotensive upon first presentation, she had no complaint of weakness or dizziness.  She was given IV fluids and her blood pressure responded appropriately.  CT imaging and labs obtained and outlined below.  Upon reassessment patient reported having developed nausea, dry heaves, shortness of breath and shaking chills.  Repeat oral temperature 98.4.  She was also found to be tachycardic with pulse rate of 120 at which time she was hypotensive with systolic pressures in the  178-188 range.  Additional labs have been ordered including lactic acid, blood cultures and a chest x-ray has also been ordered.  Her urine does show moderate leukocytes, greater than 50 WBCs with few bacteria, Rocephin  has been added.  Patient signed out to [facility], PA-C who will check on these additional results and dispo accordingly.  Amount and/or Complexity of Data Reviewed Labs: ordered.    Details: Labs significant for greater than 50 WBCs, few bacteria, respiratory panel is negative, she is got a normal WBC count at 8.4, her hemoglobin is 10.3, lipase 56, she does have some dehydration with a BUN of 27 and creatinine of 1.65 Radiology: ordered.    Details: CT abdomen and pelvis reviewed, no acute findings.  Risk Prescription drug management.        Final diagnoses:  None    ED Discharge Orders     None          Birdena Mliss RIGGERS 02/22/24 1940    Suzette Pac, MD 02/23/24 1435

## 2024-02-22 NOTE — ED Notes (Signed)
 ED Provider at bedside.

## 2024-02-22 NOTE — ED Provider Notes (Signed)
  Physical Exam  BP (!) 178/69   Pulse 99   Temp 98.4 F (36.9 C) (Oral)   Resp (!) 24   Ht 5' 8 (1.727 m)   Wt 59 kg   SpO2 100%   BMI 19.77 kg/m   Physical Exam  Procedures  Procedures  ED Course / MDM   Clinical Course as of 02/22/24 1923  Wed Feb 22, 2024  1837 Bed recheck after labs and CT imaging resulted, patient had new complaints of shaking chills nausea with emesis x 2, she  states her nausea started about an hour ago, the chills started about 10 minutes ago.  She is tachycardic at 120 on the monitor, borderline hypoxic at 92% on room air.  She is hypertensive at this time.  Zofran , chest x-ray added to workup. [JI]    Clinical Course User Index [JI] Birdena Clarity, PA-C   Medical Decision Making Amount and/or Complexity of Data Reviewed Labs: ordered. Radiology: ordered.  Risk OTC drugs. Prescription drug management. Decision regarding hospitalization.   Signout taken from Julie Idol, PA-C.  Patient came in for some mid back pain for the past few days, she initially hypotensive and had fluids which improved this.  CT abdomen pelvis ordered due to prior likely pyelonephritis and this was reassuring.  Urinalysis does show likely UTI.  Patient was going to be discharged home with antibiotics but on reevaluation had chills and was tachycardic and ill-appearing.  She is not febrile.  Chest x-ray ordered for possible pneumonia as she is also slightly short of breath, lactic acid also pending..  Chest x-ray was normal, lactic acid elevated at 2.9 so patient given additional small fluid bolus, we rechecked the temperature rectally and it was 103.5 Fahrenheit.  Patient given Tylenol .  Discussed with Dr. Sherlon for admission.       Patricia Jackson 02/22/24 2019    Suzette Pac, MD 02/23/24 1435

## 2024-02-23 ENCOUNTER — Other Ambulatory Visit (HOSPITAL_COMMUNITY): Payer: Self-pay

## 2024-02-23 ENCOUNTER — Telehealth (HOSPITAL_COMMUNITY): Payer: Self-pay | Admitting: Pharmacy Technician

## 2024-02-23 ENCOUNTER — Encounter (HOSPITAL_COMMUNITY): Payer: Self-pay | Admitting: Oncology

## 2024-02-23 ENCOUNTER — Encounter: Payer: Self-pay | Admitting: Internal Medicine

## 2024-02-23 DIAGNOSIS — A419 Sepsis, unspecified organism: Secondary | ICD-10-CM | POA: Diagnosis not present

## 2024-02-23 LAB — BLOOD CULTURE ID PANEL (REFLEXED) - BCID2

## 2024-02-23 LAB — BASIC METABOLIC PANEL WITH GFR
Anion gap: 11 (ref 5–15)
BUN: 24 mg/dL — ABNORMAL HIGH (ref 8–23)
CO2: 21 mmol/L — ABNORMAL LOW (ref 22–32)
Calcium: 8.5 mg/dL — ABNORMAL LOW (ref 8.9–10.3)
Chloride: 109 mmol/L (ref 98–111)
Creatinine, Ser: 1.49 mg/dL — ABNORMAL HIGH (ref 0.44–1.00)
GFR, Estimated: 35 mL/min — ABNORMAL LOW (ref >60)
Glucose, Bld: 86 mg/dL (ref 70–99)
Potassium: 4.1 mmol/L (ref 3.5–5.1)
Sodium: 141 mmol/L (ref 135–145)

## 2024-02-23 LAB — RESPIRATORY PANEL BY PCR

## 2024-02-23 LAB — CBC
HCT: 30.9 % — ABNORMAL LOW (ref 36.0–46.0)
Hemoglobin: 9.5 g/dL — ABNORMAL LOW (ref 12.0–15.0)
MCH: 31.6 pg (ref 26.0–34.0)
MCHC: 30.7 g/dL (ref 30.0–36.0)
MCV: 102.7 fL — ABNORMAL HIGH (ref 80.0–100.0)
Platelets: 71 K/uL — ABNORMAL LOW (ref 150–400)
RBC: 3.01 MIL/uL — ABNORMAL LOW (ref 3.87–5.11)
RDW: 18.7 % — ABNORMAL HIGH (ref 11.5–15.5)
WBC: 11.8 K/uL — ABNORMAL HIGH (ref 4.0–10.5)
nRBC: 0 % (ref 0.0–0.2)

## 2024-02-23 LAB — FOLATE: Folate: 17.7 ng/mL (ref >5.9)

## 2024-02-23 MED ORDER — SODIUM CHLORIDE 0.9 % IV SOLN
2.0000 g | INTRAVENOUS | Status: DC
  Administered 2024-02-23 – 2024-02-25 (×3): 2 g via INTRAVENOUS
  Filled 2024-02-23 (×2): qty 20

## 2024-02-23 MED ORDER — DIPHENHYDRAMINE HCL 12.5 MG/5ML PO ELIX
12.5000 mg | ORAL_SOLUTION | Freq: Once | ORAL | Status: AC
  Administered 2024-02-23: 12.5 mg via ORAL
  Filled 2024-02-23: qty 5

## 2024-02-23 NOTE — Progress Notes (Signed)
 PHARMACY - PHYSICIAN COMMUNICATION CRITICAL VALUE ALERT - BLOOD CULTURE IDENTIFICATION (BCID)  Patricia Jackson is an 83 y.o. female who presented to [facility] on 02/22/2024   Assessment:  e.coli bacteremia w/out resistance   Current antibiotics: Ceftriaxone  2000 mg IV every 24 hours  Changes to prescribed antibiotics recommended:  Patient is on recommended antibiotics - No changes needed  Results for orders placed or performed during the hospital encounter of 02/22/24  Blood Culture ID Panel (Reflexed) (Collected: 02/22/2024  6:57 PM)  Result Value Ref Range   Enterococcus faecalis NOT DETECTED NOT DETECTED   Enterococcus Faecium NOT DETECTED NOT DETECTED   Listeria monocytogenes NOT DETECTED NOT DETECTED   Staphylococcus species NOT DETECTED NOT DETECTED   Staphylococcus aureus (BCID) NOT DETECTED NOT DETECTED   Staphylococcus epidermidis NOT DETECTED NOT DETECTED   Staphylococcus lugdunensis NOT DETECTED NOT DETECTED   Streptococcus species NOT DETECTED NOT DETECTED   Streptococcus agalactiae NOT DETECTED NOT DETECTED   Streptococcus pneumoniae NOT DETECTED NOT DETECTED   Streptococcus pyogenes NOT DETECTED NOT DETECTED   A.calcoaceticus-baumannii NOT DETECTED NOT DETECTED   Bacteroides fragilis NOT DETECTED NOT DETECTED   Enterobacterales DETECTED (A) NOT DETECTED   Enterobacter cloacae complex NOT DETECTED NOT DETECTED   Escherichia coli DETECTED (A) NOT DETECTED   Klebsiella aerogenes NOT DETECTED NOT DETECTED   Klebsiella oxytoca NOT DETECTED NOT DETECTED   Klebsiella pneumoniae NOT DETECTED NOT DETECTED   Proteus species NOT DETECTED NOT DETECTED   Salmonella species NOT DETECTED NOT DETECTED   Serratia marcescens NOT DETECTED NOT DETECTED   Haemophilus influenzae NOT DETECTED NOT DETECTED   Neisseria meningitidis NOT DETECTED NOT DETECTED   Pseudomonas aeruginosa NOT DETECTED NOT DETECTED   Stenotrophomonas maltophilia NOT DETECTED NOT DETECTED   Candida albicans  NOT DETECTED NOT DETECTED   Candida auris NOT DETECTED NOT DETECTED   Candida glabrata NOT DETECTED NOT DETECTED   Candida krusei NOT DETECTED NOT DETECTED   Candida parapsilosis NOT DETECTED NOT DETECTED   Candida tropicalis NOT DETECTED NOT DETECTED   Cryptococcus neoformans/gattii NOT DETECTED NOT DETECTED   CTX-M ESBL NOT DETECTED NOT DETECTED   Carbapenem resistance IMP NOT DETECTED NOT DETECTED   Carbapenem resistance KPC NOT DETECTED NOT DETECTED   Carbapenem resistance NDM NOT DETECTED NOT DETECTED   Carbapenem resist OXA 48 LIKE NOT DETECTED NOT DETECTED   Carbapenem resistance VIM NOT DETECTED NOT DETECTED    Elspeth JAYSON Sour 02/23/2024  2:40 PM

## 2024-02-23 NOTE — TOC Initial Note (Signed)
 Transition of Care [facility]) - Initial/Assessment Note    Patient Details  Name: Patricia Jackson MRN: [id number] Date of Birth: February 04, 1941  Transition of Care [facility]) CM/SW Contact:    Mcarthur Saddie Kim, LCSW Phone Number: 02/23/2024, 7:52 AM  Clinical Narrative:  Assessment completed due to high risk readmission score. Pt reports she lives with her son and daughter-in-law. She is independent with ADLs and ambulates with a walker at baseline. No current home health services. Family provides transportation to appointments. Pt plans to return home when medically stable. PT evaluation pending. TOC will follow.                  Expected Discharge Plan: Home/Self Care Barriers to Discharge: Continued Medical Work up   Patient Goals and CMS Choice Patient states their goals for this hospitalization and ongoing recovery are:: return home   Choice offered to / list presented to : Patient  ownership interest in [facility].provided to::  (n/a)    Expected Discharge Plan and Services In-house Referral: Clinical Social Work     Living arrangements for the past 2 months: Single Family Home                                      Prior Living Arrangements/Services Living arrangements for the past 2 months: Single Family Home Lives with:: Adult Children Patient language and need for interpreter reviewed:: Yes Do you feel safe going back to the place where you live?: Yes      Need for Family Participation in Patient Care: No (Comment)   Current home services: DME (walker) Criminal Activity/Legal Involvement Pertinent to Current Situation/Hospitalization: No - Comment as needed  Activities of Daily Living   ADL Screening (condition at time of admission) Independently performs ADLs?: No Does the patient have a NEW difficulty with bathing/dressing/toileting/self-feeding that is expected to last >3 days?: No Does the patient have a NEW difficulty with getting  in/out of bed, walking, or climbing stairs that is expected to last >3 days?: Yes (Initiates electronic notice to provider for possible PT consult) Does the patient have a NEW difficulty with communication that is expected to last >3 days?: No Is the patient deaf or have difficulty hearing?: Yes Does the patient have difficulty seeing, even when wearing glasses/contacts?: Yes Does the patient have difficulty concentrating, remembering, or making decisions?: No  Permission Sought/Granted                  Emotional Assessment     Affect (typically observed): Appropriate Orientation: : Oriented to Self, Oriented to Place, Oriented to  Time, Oriented to Situation Alcohol / Substance Use: Not Applicable Psych Involvement: No (comment)  Admission diagnosis:  Sepsis (HCC) [A41.9] Urinary tract infection with hematuria, site unspecified [N39.0, R31.9] Patient Active Problem List   Diagnosis Date Noted   Sepsis (HCC) 02/22/2024   Hypoalbuminemia due to protein-calorie malnutrition 01/28/2024   Anemia, unspecified 01/27/2024   Acute pyelonephritis 01/27/2024   Constipation 12/29/2023   History of colon polyps 12/29/2023   Tobacco abuse 11/16/2023   Chronic anticoagulation 09/12/2023   Melena 09/06/2023   Symptomatic anemia 09/05/2023   Anemia of chronic renal failure 08/22/2023   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   Severe anemia 06/16/2023   Acute blood loss anemia 06/15/2023   Elevated brain natriuretic peptide (BNP) level 02/03/2023   Paroxysmal atrial fibrillation with RVR (HCC) 02/02/2023  Malnutrition of moderate degree 01/24/2023   Atrial fibrillation with RVR (HCC) 01/20/2023   On anticoagulant therapy 11/21/2022   ABLA (acute blood loss anemia) 11/21/2022   Raynaud's syndrome 11/20/2022   Osteoarthritis 11/20/2022   Generalized anxiety disorder 11/18/2022   Positive antinuclear antibody 11/02/2022   Insomnia 11/02/2022   Gastroesophageal reflux disease  11/01/2022   Abrasion of lower back 10/11/2022   Paroxysmal atrial fibrillation (HCC) 09/23/2022   Vitamin D  deficiency 09/23/2022   Syncope 09/13/2022   CAP (community acquired pneumonia) 09/12/2022   sepsis from pneumonia 07/26/2022   intermittent confusion 07/26/2022   Hyponatremia 07/26/2022   Preoperative evaluation of a medical condition to rule out surgical contraindications (TAR required) 05/14/2022   Fracture of distal end of radius 05/02/2022   Left wrist pain 04/28/2022   Osteoporosis 01/19/2022   DOE (dyspnea on exertion)    Acidosis, unspecified 06/20/2021   Elevated troponin 06/19/2021   IDA (iron  deficiency anemia) 06/12/2021   Acquired hypothyroidism 05/15/2021   CKD stage 3b, GFR 30-44 ml/min (HCC) 05/15/2021   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   Essential hypertension 12/10/2020   Retinal hemorrhage, right eye 11/04/2020   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   Traction detachment of left retina 08/03/2019   Left retinal detachment 08/01/2019   Age-related macular degeneration 08/01/2019   Retinal hemorrhage of left eye 08/01/2019   Advanced nonexudative age-related macular degeneration of left eye with subfoveal involvement 08/01/2019   Retinal detachment 07/26/2019   Choroidal detachment of right eye 07/26/2019   Exudative retinopathy of left eye 07/26/2019   Thrombocytopenic disorder 07/04/2019   Orthostatic syncope 08/27/2017   Atypical chest pain 08/27/2017   Atrial flutter (HCC) 10/28/2016   Chronic kidney disease, stage 4 (severe) (HCC) 10/28/2016   Chronic obstructive pulmonary disease (HCC) 10/28/2016   Depression 10/28/2016   Acute kidney injury superimposed on stage 4 chronic kidney disease (HCC) 09/08/2016   Hyperglycemia 09/08/2016   Esophageal dysphagia    Anemia 04/07/2016   CAD in native artery 03/16/2016   S/P angioplasty with stent 03/15/16 DES Resolute, pLCX 03/16/2016   NSTEMI (non-ST elevated myocardial infarction) (HCC)  03/12/2016   Dyspnea on exertion    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HCC) 10/31/2015   Mixed hyperlipidemia 10/31/2015   Atrial flutter with rapid ventricular response (HCC) 10/30/2015   Back pain 11/26/2013   Arm pain 11/26/2013   Chest pain 11/26/2013   PCP:  Shona Norleen PEDLAR, MD Pharmacy:   [facility] - [place], [place] - [address] [place] [address] Phone: [phone] Fax: [phone]     Social Drivers of Health (SDOH) Social History: SDOH Screenings   Food Insecurity: No Food Insecurity (02/22/2024)  Housing: Low Risk  (02/22/2024)  Transportation Needs: No Transportation Needs (02/22/2024)  Utilities: Not At Risk (02/22/2024)  Depression (PHQ2-9): Low Risk  (02/07/2024)  Financial Resource Strain: Low Risk  (11/26/2022)  Social Connections: Moderately Integrated (02/22/2024)  Stress: No Stress Concern Present (11/26/2022)  Tobacco Use: Medium Risk (02/22/2024)  Health Literacy: Adequate Health Literacy (11/26/2022)   SDOH Interventions:     Readmission Risk Interventions    02/23/2024    7:47 AM 01/30/2024   12:24 PM 01/28/2024    2:32 PM  Readmission Risk Prevention Plan  Transportation Screening Complete Complete   Medication Review Oceanographer) Complete Complete   PCP/Specialist Appt Not Complete comments   Talked to pt about following up with PCP at [place]  [facility] or Home Care Consult Complete Complete  SW Recovery Care/Counseling Consult Complete Complete   Palliative Care Screening Not Applicable Not Applicable   Skilled Nursing Facility Not Applicable Not Applicable

## 2024-02-23 NOTE — Evaluation (Signed)
 Physical Therapy Evaluation Patient Details Name: Patricia Jackson MRN: [id number] DOB: 1941-01-27 Today's Date: 02/23/2024  History of Present Illness  Patricia Jackson  is a 83 y.o. female,  with medical history significant of hypertension, hyperlipidemia, hypothyroidism, atrial fibrillation on chronic anticoagulation, COPD,  chronic kidney disease, anemia of chronic kidney disease and chronic GI bleed who presents to the emergency department with his, polyuria, poor appetite and back pain, patient with recent hospitalization discharge 01/28/2024, with pneumonia and pyelonephritis, patient reports she was in her usual state of health, to have intermittent pain across her lower back and for the past 2 days she has been feeling worse, she does report cough, congestion with yellow phlegm, she does report polyuria, denies neck pain, stiffness,.  - In ED she had elevated lactic acid 2.9, fever 103.5, initially hypotensive but responded to fluid bolus, creatinine is up to 1.65 from baseline of 1.1 to, CT chest/abdomen/pelvis with no acute findings, chest x-ray with no evidence of pneumonia, UA positive for UTI, started on Rocephin  and [facility]  hospitalist consulted to admit   Clinical Impression  Patient requires increased time for sitting up and transferring to/from chair due to weakness, tolerated ambulating in room with slow labored movement without loss of balance, but limited mostly due to c/o fatigue and mild difficulty breathing. Patient SpO2 at 94% after ambulation on room air and tolerated sitting up in chair. Patient will benefit from continued skilled physical therapy in hospital and recommended venue below to increase strength, balance, endurance for safe ADLs and gait.       If plan is discharge home, recommend the following: A little help with walking and/or transfers;A little help with bathing/dressing/bathroom;Help with stairs or ramp for entrance;Assist for transportation;Assistance with  cooking/housework   Can travel by private vehicle        Equipment Recommendations None recommended by PT  Recommendations for Other Services       Functional Status Assessment Patient has had a recent decline in their functional status and demonstrates the ability to make significant improvements in function in a reasonable and predictable amount of time.     Precautions / Restrictions Precautions Precautions: Fall Restrictions Weight Bearing Restrictions Per Provider Order: No      Mobility  Bed Mobility Overal bed mobility: Needs Assistance Bed Mobility: Supine to Sit     Supine to sit: Contact guard, Min assist     General bed mobility comments: labored movement    Transfers Overall transfer level: Needs assistance Equipment used: Rolling walker (2 wheels) Transfers: Sit to/from Stand, Bed to chair/wheelchair/BSC Sit to Stand: Min assist, Contact guard assist   Step pivot transfers: Min assist, Contact guard assist       General transfer comment: labored movement    Ambulation/Gait Ambulation/Gait assistance: Min assist Gait Distance (Feet): 35 Feet Assistive device: Rolling walker (2 wheels) Gait Pattern/deviations: Decreased step length - left, Decreased stance time - right, Decreased stride length Gait velocity: slow     General Gait Details: slow labored unsteady movement without loss of balance, but limited mostly due to c/o mild SOB and fatigue  Stairs            Wheelchair Mobility     Tilt Bed    Modified Rankin (Stroke Patients Only)       Balance Overall balance assessment: Needs assistance Sitting-balance support: Feet supported, No upper extremity supported Sitting balance-Leahy Scale: Fair Sitting balance - Comments: fair/good seated at EOB   Standing balance support: Reliant on assistive  device for balance, During functional activity, Bilateral upper extremity supported Standing balance-Leahy Scale: Poor Standing  balance comment: fair/poor using RW                             Pertinent Vitals/Pain Pain Assessment Pain Assessment: No/denies pain    Home Living Family/patient expects to be discharged to:: Private residence Living Arrangements: Children Available Help at Discharge: Family;Available 24 hours/day Type of Home: House Home Access: Stairs to enter Entrance Stairs-Rails: Right Entrance Stairs-Number of Steps: 1   Home Layout: One level Home Equipment: Rollator (4 wheels);Tub bench      Prior Function Prior Level of Function : Needs assist       Physical Assist : ADLs (physical);Mobility (physical) Mobility (physical): Gait;Transfers;Bed mobility;Stairs   Mobility Comments: household and short distanced community ambulation using Rollator ADLs Comments: reports independence with ADLs, family assist with IADLs     Extremity/Trunk Assessment   Upper Extremity Assessment Upper Extremity Assessment: Generalized weakness    Lower Extremity Assessment Lower Extremity Assessment: Generalized weakness    Cervical / Trunk Assessment Cervical / Trunk Assessment: Normal  Communication   Communication Communication: No apparent difficulties    Cognition Arousal: Alert Behavior During Therapy: WFL for tasks assessed/performed   PT - Cognitive impairments: No apparent impairments                         Following commands: Intact       Cueing Cueing Techniques: Verbal cues     General Comments      Exercises     Assessment/Plan    PT Assessment Patient needs continued PT services  PT Problem List Decreased strength;Decreased activity tolerance;Decreased balance;Decreased mobility       PT Treatment Interventions DME instruction;Gait training;Stair training;Functional mobility training;Therapeutic activities;Therapeutic exercise;Balance training;Patient/family education    PT Goals (Current goals can be found in the Care Plan section)   Acute Rehab PT Goals Patient Stated Goal: return home with family to assist PT Goal Formulation: With patient Time For Goal Achievement: 02/27/24 Potential to Achieve Goals: Good    Frequency Min 3X/week     Co-evaluation               AM-PAC PT 6 Clicks Mobility  Outcome Measure Help needed turning from your back to your side while in a flat bed without using bedrails?: None Help needed moving from lying on your back to sitting on the side of a flat bed without using bedrails?: A Little Help needed moving to and from a bed to a chair (including a wheelchair)?: A Little Help needed standing up from a chair using your arms (e.g., wheelchair or bedside chair)?: A Little Help needed to walk in hospital room?: A Little Help needed climbing 3-5 steps with a railing? : A Lot 6 Click Score: 18    End of Session   Activity Tolerance: Patient tolerated treatment well;Patient limited by fatigue Patient left: in chair;with call bell/phone within reach Nurse Communication: Mobility status PT Visit Diagnosis: Unsteadiness on feet (R26.81);Other abnormalities of gait and mobility (R26.89);Muscle weakness (generalized) (M62.81)    Time: 8984-8955 PT Time Calculation (min) (ACUTE ONLY): 29 min   Charges:   PT Evaluation $PT Eval Moderate Complexity: 1 Mod PT Treatments $Therapeutic Activity: 23-37 mins PT General Charges $$ ACUTE PT VISIT: 1 Visit         1:43 PM, 02/23/24 Lynwood Music, MPT  Physical Therapist with [facility] 336 [phone] office [phone] mobile phone

## 2024-02-23 NOTE — Progress Notes (Signed)
 PROGRESS NOTE    Patricia Jackson  FMW:[id number] DOB: 27-Mar-1941 DOA: 02/22/2024 PCP: Shona Norleen PEDLAR, MD   Brief Narrative:    Patricia Jackson  is a 83 y.o. female,  with medical history significant of hypertension, hyperlipidemia, hypothyroidism, atrial fibrillation on chronic anticoagulation, COPD,  chronic kidney disease, anemia of chronic kidney disease and chronic GI bleed who presents to the emergency department with his, polyuria, poor appetite and back pain, patient with recent hospitalization discharge 01/28/2024, with pneumonia and pyelonephritis, patient reports she was in her usual state of health, to have intermittent pain across her lower back and for the past 2 days she has been feeling worse.  Patient was admitted with sepsis, POA secondary to UTI and has been empirically started on IV Rocephin .  She is also noted to have AKI on CKD stage IV.  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, pLCX   Paroxysmal atrial fibrillation (HCC)   Chronic kidney disease, stage 4 (severe) (HCC)  Assessment and Plan:   Sepsis, POA UTI with gram-negative rod bacteremia - sepsis, POA, fever 103.5, hypotensive 78/44, tachycardic and tachypneic with elevated lactic acid - No acute finding on CT chest/abdomen/pelvis, x-ray with no evidence of pneumonia - As positive, she does report urinary symptoms - Continue with IV Rocephin , follow on urine cultures and blood cultures - She is with recent hospitalization for acute pyelonephritis, even though urine cultures were negative then, we will need prolonged antibiotic course treatment for complicated UTI (7 days) - Viral panel negative   Acute kidney injury superimposed on stage IV CKD-improving - Secondary to dehydration and volume depletion - Continue with IV fluids   Atrial  fibrillation Continue with amiodarone  and Eliquis     Hypertension -Hold amlodipine  and losartan as blood pressure soft on presentation   Hyperlipidemia - Continue with home statin    COPD No wheezing, continue with home meds and as needed albuterol    Anemia of chronic disease Macrocytic anemia - B12 738 -Folate 17.7 -Continue to monitor CBC   Thrombocytopenia -this appears to be chronic, but platelet lower than baseline this admission, most likely in the setting of sepsis    DVT prophylaxis:apixaban  Code Status: Full Family Communication: None at bedside Disposition Plan:  Status is: Inpatient Remains inpatient appropriate because: Need for IV medications.   Consultants:  None  Procedures:  None  Antimicrobials:  Anti-infectives (From admission, onward)    Start     Dose/Rate Route Frequency Ordered Stop   02/23/24 1400  cefTRIAXone  (ROCEPHIN ) 1 g in sodium chloride  0.9 % 100 mL IVPB        1 g 200 mL/hr over 30 Minutes Intravenous Every 24 hours 02/22/24 2049     02/22/24 1930  cefTRIAXone  (ROCEPHIN ) 1 g in sodium chloride  0.9 % 100 mL IVPB        1 g 200 mL/hr over 30 Minutes Intravenous  Once 02/22/24 1926 02/22/24 2048      Subjective: Patient seen and evaluated today with no new acute complaints or concerns. No acute concerns or events noted overnight.  Objective: Vitals:   02/22/24 2103 02/22/24 2211 02/23/24 0208 02/23/24 0500  BP:  (!) 106/55 114/65 (!) 112/58  Pulse:  81 73 66  Resp:  19 18 18   Temp: 99.6 F (37.6 C) 97.8 F (36.6 C) 98.3 F (36.8 C) (!)  97.5 F (36.4 C)  TempSrc: Oral Oral Oral Oral  SpO2:  91% 96% 99%  Weight:      Height:        Intake/Output Summary (Last 24 hours) at 02/23/2024 0734 Last data filed at 02/22/2024 2321 Gross per 24 hour  Intake 2141.27 ml  Output --  Net 2141.27 ml   Filed Weights   02/22/24 1209  Weight: 59 kg    Examination:  General exam: Appears calm and comfortable  Respiratory  system: Clear to auscultation. Respiratory effort normal. Cardiovascular system: S1 & S2 heard, RRR.  Gastrointestinal system: Abdomen is soft Central nervous system: Alert and awake Extremities: No edema Skin: No significant lesions noted Psychiatry: Flat affect.    Data Reviewed: I have personally reviewed following labs and imaging studies  CBC: Recent Labs  Lab 02/22/24 1250 02/23/24 0437  WBC 8.4 11.8*  NEUTROABS 7.3  --   HGB 10.3* 9.5*  HCT 33.2* 30.9*  MCV 102.5* 102.7*  PLT 81* 71*   Basic Metabolic Panel: Recent Labs  Lab 02/22/24 1250 02/22/24 2011 02/23/24 0437  NA 138  --  141  K 4.2  --  4.1  CL 105  --  109  CO2 21*  --  21*  GLUCOSE 127*  --  86  BUN 27*  --  24*  CREATININE 1.65*  --  1.49*  CALCIUM  9.0  --  8.5*  MG  --  1.9  --   PHOS  --  3.3  --    GFR: Estimated Creatinine Clearance: 28 mL/min (A) (by C-G formula based on SCr of 1.49 mg/dL (H)). Liver Function Tests: Recent Labs  Lab 02/22/24 1250  AST 39  ALT 18  ALKPHOS 71  BILITOT 0.6  PROT 6.6  ALBUMIN  3.6   Recent Labs  Lab 02/22/24 1250  LIPASE 56*   No results for input(s): AMMONIA in the last 168 hours. Coagulation Profile: No results for input(s): INR, PROTIME in the last 168 hours. Cardiac Enzymes: No results for input(s): CKTOTAL, CKMB, CKMBINDEX, TROPONINI in the last 168 hours. BNP (last 3 results) No results for input(s): PROBNP in the last 8760 hours. HbA1C: No results for input(s): HGBA1C in the last 72 hours. CBG: No results for input(s): GLUCAP in the last 168 hours. Lipid Profile: No results for input(s): CHOL, HDL, LDLCALC, TRIG, CHOLHDL, LDLDIRECT in the last 72 hours. Thyroid  Function Tests: No results for input(s): TSH, T4TOTAL, FREET4, T3FREE, THYROIDAB in the last 72 hours. Anemia Panel: Recent Labs    02/22/24 2011  VITAMINB12 738  FOLATE 17.7   Sepsis Labs: Recent Labs  Lab 02/22/24 1857  02/22/24 2011  LATICACIDVEN 2.9* 1.3    Recent Results (from the past 240 hours)  Resp panel by RT-PCR (RSV, Flu A&B, Covid) Anterior Nasal Swab     Status: None   Collection Time: 02/22/24  3:30 PM   Specimen: Anterior Nasal Swab  Result Value Ref Range Status   SARS Coronavirus 2 by RT PCR NEGATIVE NEGATIVE Final    Comment: (NOTE) SARS-CoV-2 target nucleic acids are NOT DETECTED.  The SARS-CoV-2 RNA is generally detectable in upper respiratory specimens during the acute phase of infection. The lowest concentration of SARS-CoV-2 viral copies this assay can detect is 138 copies/mL. A negative result does not preclude SARS-Cov-2 infection and should not be used as the sole basis for treatment or other patient management decisions. A negative result may occur with  improper specimen collection/handling, submission of specimen  other than nasopharyngeal swab, presence of viral mutation(s) within the areas targeted by this assay, and inadequate number of viral copies(<138 copies/mL). A negative result must be combined with clinical observations, patient history, and epidemiological information. The expected result is Negative.  Fact Sheet for Patients:  bloggercourse.com  Fact Sheet for Healthcare Providers:  seriousbroker.it  This test is no t yet approved or cleared by the [place]  FDA and  has been authorized for detection and/or diagnosis of SARS-CoV-2 by FDA under an Emergency Use Authorization (EUA). This EUA will remain  in effect (meaning this test can be used) for the duration of the COVID-19 declaration under Section 564(b)(1) of the Act, 21 U.S.C.section 360bbb-3(b)(1), unless the authorization is terminated  or revoked sooner.       Influenza A by PCR NEGATIVE NEGATIVE Final   Influenza B by PCR NEGATIVE NEGATIVE Final    Comment: (NOTE) The Xpert Xpress SARS-CoV-2/FLU/RSV plus assay is intended as an aid in  the diagnosis of influenza from Nasopharyngeal swab specimens and should not be used as a sole basis for treatment. Nasal washings and aspirates are unacceptable for Xpert Xpress SARS-CoV-2/FLU/RSV testing.  Fact Sheet for Patients: bloggercourse.com  Fact Sheet for Healthcare Providers: seriousbroker.it  This test is not yet approved or cleared by the [place]  FDA and has been authorized for detection and/or diagnosis of SARS-CoV-2 by FDA under an Emergency Use Authorization (EUA). This EUA will remain in effect (meaning this test can be used) for the duration of the COVID-19 declaration under Section 564(b)(1) of the Act, 21 U.S.C. section 360bbb-3(b)(1), unless the authorization is terminated or revoked.     Resp Syncytial Virus by PCR NEGATIVE NEGATIVE Final    Comment: (NOTE) Fact Sheet for Patients: bloggercourse.com  Fact Sheet for Healthcare Providers: seriousbroker.it  This test is not yet approved or cleared by the [place]  FDA and has been authorized for detection and/or diagnosis of SARS-CoV-2 by FDA under an Emergency Use Authorization (EUA). This EUA will remain in effect (meaning this test can be used) for the duration of the COVID-19 declaration under Section 564(b)(1) of the Act, 21 U.S.C. section 360bbb-3(b)(1), unless the authorization is terminated or revoked.  Performed at [facility], [address] [place], [place] [address]   Blood culture (routine x 2)     Status: None (Preliminary result)   Collection Time: 02/22/24  6:57 PM   Specimen: BLOOD  Result Value Ref Range Status   Specimen Description BLOOD LEFT ANTECUBITAL  Final   Special Requests   Final    BOTTLES DRAWN AEROBIC AND ANAEROBIC Blood Culture adequate volume   Culture   Final    NO GROWTH < 12 HOURS Performed at [facility], [address], [place], [place] [address]     Report Status PENDING  Incomplete  Blood culture (routine x 2)     Status: None (Preliminary result)   Collection Time: 02/22/24  8:11 PM   Specimen: BLOOD  Result Value Ref Range Status   Specimen Description BLOOD BLOOD LEFT HAND  Final   Special Requests   Final    BOTTLES DRAWN AEROBIC AND ANAEROBIC Blood Culture adequate volume   Culture   Final    NO GROWTH < 12 HOURS Performed at [facility], [address] [place], [place] [address]    Report Status PENDING  Incomplete         Radiology Studies: DG Chest Portable 1 View Result Date: 02/22/2024 CLINICAL DATA:  Intermittent lower  to mid back pain. EXAM: PORTABLE CHEST 1 VIEW COMPARISON:  January 27, 2024 FINDINGS: The heart size and mediastinal contours are within normal limits. There is marked severity calcification of the thoracic aorta. The lungs are hyperinflated. Moderate severity diffuse, chronic appearing increased interstitial lung markings are seen. Mild areas of scarring and/or atelectasis are noted within the bilateral lung bases. No pleural effusion or pneumothorax is identified. The visualized skeletal structures are unremarkable. IMPRESSION: 1. COPD with chronic appearing increased interstitial lung markings. 2. Mild bibasilar scarring and/or atelectasis. Electronically Signed   By: Suzen Dials M.D.   On: 02/22/2024 19:34   CT L-SPINE NO CHARGE Result Date: 02/22/2024 CLINICAL DATA:  Low back pain for 2 weeks. EXAM: CT LUMBAR SPINE WITHOUT CONTRAST TECHNIQUE: Multidetector CT imaging of the lumbar spine was performed without intravenous contrast administration. Multiplanar CT image reconstructions were also generated. RADIATION DOSE REDUCTION: This exam was performed according to the departmental dose-optimization program which includes automated exposure control, adjustment of the mA and/or kV according to patient size and/or use of iterative reconstruction technique. COMPARISON:  CT scan 05/09/2022 FINDINGS:  Segmentation: There are five lumbar type vertebral bodies. The last full intervertebral disc space is labeled L5-S1. Alignment: Normal Vertebrae: Age related osteoporosis but no bone lesions or fractures. Paraspinal and other soft tissues: Advanced aortic and branch vessel calcifications but no aneurysm. No retroperitoneal adenopathy or mass. No significant paraspinal findings. Disc levels: T12-L1: No significant findings. L1-2: No significant findings. L2-3: Broad-based bulging degenerated annulus, osteophytic ridging, facet disease and ligamentum flavum thickening all contributing to severe spinal and bilateral lateral recess stenosis which appears relatively stable. Mild bilateral foraminal stenosis is also stable. L3-4: Diffuse bulging annulus, facet disease and ligamentum flavum thickening contributing to moderate to moderately severe spinal and bilateral lateral recess stenosis, unchanged. No foraminal stenosis. L4-5: Bulging annulus, facet disease and ligamentum flavum thickening contributing to moderate spinal and bilateral lateral recess stenosis. No foraminal stenosis. L5-S1: Facet disease but no disc protrusions, significant spinal or foraminal stenosis. IMPRESSION: 1. Age related osteoporosis but no bone lesions or fractures. 2. 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 No significant change. 3. Moderate to moderately severe multifactorial spinal and bilateral lateral recess stenosis at L3-4, unchanged. 4. Moderate spinal and bilateral lateral recess stenosis at L4-5, unchanged. 5. Aortic atherosclerosis. Aortic Atherosclerosis (ICD10-I70.0). Electronically Signed   By: MYRTIS Stammer M.D.   On: 02/22/2024 15:43   CT ABDOMEN PELVIS WO CONTRAST Result Date: 02/22/2024 CLINICAL DATA:  Abdominal and back pain for 2 weeks. EXAM: CT ABDOMEN AND PELVIS WITHOUT CONTRAST TECHNIQUE: Multidetector CT imaging of the abdomen and pelvis was performed following the standard  protocol without IV contrast. RADIATION DOSE REDUCTION: This exam was performed according to the departmental dose-optimization program which includes automated exposure control, adjustment of the mA and/or kV according to patient size and/or use of iterative reconstruction technique. COMPARISON:  01/27/2024 FINDINGS: Lower chest: Stable aortic atherosclerotic calcifications. The lung bases are clear of an acute process. Stable basilar scarring changes. Hepatobiliary: No hepatic lesions or intrahepatic biliary dilatation. Suspect small gallstones in the gallbladder. No findings for acute cholecystitis. No common bile duct dilatation. Pancreas: No mass, inflammation or ductal dilatation. Age related pancreatic atrophy. Spleen: Normal size.  No focal lesions. Adrenals/Urinary Tract: The adrenal glands are normal. Stable left renal cysts. No worrisome renal lesions are identified without contrast. No renal calculi or obstructing ureteral calculi. No bladder mass or bladder calculi. Mild cystocele noted. Stomach/Bowel: The stomach,  duodenum, small bowel and colon are grossly normal without oral contrast. No acute inflammatory process, mass lesions or obstructive findings. Stable colonic diverticulosis. Vascular/Lymphatic: Stable advanced atherosclerotic calcification involving the aorta and branch vessels but no aneurysm. No mesenteric or retroperitoneal mass or adenopathy. Reproductive: The uterus is unremarkable.  No adnexal mass. Other: No pelvic mass or adenopathy. No free pelvic fluid collections. No inguinal mass or adenopathy. No abdominal wall hernia or subcutaneous lesions. Musculoskeletal: No significant bony findings. No lumbar compression fracture. Age related osteopenia. IMPRESSION: 1. No acute abdominal/pelvic findings, mass lesions or adenopathy. 2. No renal, ureteral or bladder calculi or mass. 3. Stable left renal cysts. 4. Suspect small gallstones in the gallbladder. No findings for acute  cholecystitis. 5. Stable colonic diverticulosis without findings for acute diverticulitis. 6. Stable advanced atherosclerotic calcification involving the aorta and branch vessels. 7. Aortic atherosclerosis. Aortic Atherosclerosis (ICD10-I70.0). Electronically Signed   By: MYRTIS Stammer M.D.   On: 02/22/2024 15:33        Scheduled Meds:  amiodarone   200 mg Oral Daily   apixaban   2.5 mg Oral BID   budesonide -glycopyrrolate -formoterol   2 puff Inhalation BID   levothyroxine   75 mcg Oral QAC breakfast   lubiprostone   24 mcg Oral BID WC   pantoprazole   40 mg Oral BID   sertraline   25 mg Oral Daily   timolol  1 drop Right Eye BID   traZODone   50 mg Oral QHS   Continuous Infusions:  cefTRIAXone  (ROCEPHIN )  IV     lactated ringers  75 mL/hr at 02/22/24 2321     LOS: 1 day    Time spent: 55 minutes    Marcoantonio Legault D Aleeya Veitch, DO [facility]  If 7PM-7AM, please contact night-coverage [email or web address] 02/23/2024, 7:34 AM

## 2024-02-23 NOTE — Plan of Care (Signed)
  Problem: Acute Rehab PT Goals(only PT should resolve) Goal: Pt Will Go Supine/Side To Sit Outcome: Progressing Flowsheets (Taken 02/23/2024 1345) Pt will go Supine/Side to Sit:  with modified independence  with supervision Goal: Patient Will Transfer Sit To/From Stand Outcome: Progressing Flowsheets (Taken 02/23/2024 1345) Patient will transfer sit to/from stand:  with modified independence  with supervision Goal: Pt Will Transfer Bed To Chair/Chair To Bed Outcome: Progressing Flowsheets (Taken 02/23/2024 1345) Pt will Transfer Bed to Chair/Chair to Bed:  with modified independence  with supervision Goal: Pt Will Ambulate Outcome: Progressing Flowsheets (Taken 02/23/2024 1345) Pt will Ambulate:  50 feet  with supervision  with modified independence  with rolling walker   1:45 PM, 02/23/24 Lynwood Music, MPT Physical Therapist with [facility] [phone] office [phone] mobile phone

## 2024-02-23 NOTE — Telephone Encounter (Signed)
 Patient Product/process Development Scientist completed.    The patient is insured through [employer]. Patient has Medicare and is not eligible for a copay card, but may be able to apply for patient assistance or Medicare RX Payment Plan (Patient Must reach out to their plan, if eligible for payment plan), if available.    Ran test claim for Breztri  and the current 30 day co-pay is $0.00.  Ran test claim for Eliquis  2.5 mg and was filled 02/23/2024 can be filled agaion on 03/15/2024   This test claim was processed through [facility]- copay amounts may vary at other pharmacies due to pharmacy/plan contracts, or as the patient moves through the different stages of their insurance plan.     Reyes Sharps, CPHT Pharmacy Technician Patient Advocate Specialist Lead [facility] Patient Advocate Team Direct Number: [phone]  Fax: [phone]

## 2024-02-23 NOTE — TOC Progression Note (Signed)
 Transition of Care [facility]) - Progression Note    Patient Details  Name: Patricia Jackson MRN: [id number] Date of Birth: Jul 16, 1940  Transition of [facility]) CM/SW Contact  Sharlyne Stabs, RN Phone Number: 02/23/2024, 11:12 AM  Clinical Narrative:  PT is recommending HHPT. CM at the bedside. Patient states she had had HHPT two times, she has all the handouts and continues to do the exercises daily. No new needs identified. IPCM following.    Expected Discharge Plan: Home/Self Care Barriers to Discharge: Continued Medical Work up    Expected Discharge Plan and Services In-house Referral: Clinical Social Work     Living arrangements for the past 2 months: Single Family Home                      Social Drivers of Health (SDOH) Interventions SDOH Screenings   Food Insecurity: No Food Insecurity (02/22/2024)  Housing: Low Risk  (02/22/2024)  Transportation Needs: No Transportation Needs (02/22/2024)  Utilities: Not At Risk (02/22/2024)  Depression (PHQ2-9): Low Risk  (02/07/2024)  Financial Resource Strain: Low Risk  (11/26/2022)  Social Connections: Moderately Integrated (02/22/2024)  Stress: No Stress Concern Present (11/26/2022)  Tobacco Use: Medium Risk (02/22/2024)  Health Literacy: Adequate Health Literacy (11/26/2022)    Readmission Risk Interventions    02/23/2024    7:47 AM 01/30/2024   12:24 PM 01/28/2024    2:32 PM  Readmission Risk Prevention Plan  Transportation Screening Complete Complete   Medication Review Oceanographer) Complete Complete   PCP/Specialist Appt Not Complete comments   Talked to pt about following up with PCP at DC  [facility] or Home Care Consult Complete Complete   SW Recovery Care/Counseling Consult Complete Complete   Palliative Care Screening Not Applicable Not Applicable   Skilled Nursing Facility Not Applicable Not Applicable

## 2024-02-24 ENCOUNTER — Ambulatory Visit: Admitting: Nurse Practitioner

## 2024-02-24 DIAGNOSIS — A419 Sepsis, unspecified organism: Secondary | ICD-10-CM | POA: Diagnosis not present

## 2024-02-24 LAB — BASIC METABOLIC PANEL WITH GFR
Anion gap: 9 (ref 5–15)
BUN: 29 mg/dL — ABNORMAL HIGH (ref 8–23)
CO2: 22 mmol/L (ref 22–32)
Calcium: 8.7 mg/dL — ABNORMAL LOW (ref 8.9–10.3)
Chloride: 109 mmol/L (ref 98–111)
Creatinine, Ser: 1.48 mg/dL — ABNORMAL HIGH (ref 0.44–1.00)
GFR, Estimated: 35 mL/min — ABNORMAL LOW
Glucose, Bld: 109 mg/dL — ABNORMAL HIGH (ref 70–99)
Potassium: 3.9 mmol/L (ref 3.5–5.1)
Sodium: 140 mmol/L (ref 135–145)

## 2024-02-24 LAB — CBC
HCT: 28.7 % — ABNORMAL LOW (ref 36.0–46.0)
Hemoglobin: 9.1 g/dL — ABNORMAL LOW (ref 12.0–15.0)
MCH: 31.7 pg (ref 26.0–34.0)
MCHC: 31.7 g/dL (ref 30.0–36.0)
MCV: 100 fL (ref 80.0–100.0)
Platelets: 75 10*3/uL — ABNORMAL LOW (ref 150–400)
RBC: 2.87 MIL/uL — ABNORMAL LOW (ref 3.87–5.11)
RDW: 18.8 % — ABNORMAL HIGH (ref 11.5–15.5)
WBC: 8.8 10*3/uL (ref 4.0–10.5)
nRBC: 0 % (ref 0.0–0.2)

## 2024-02-24 LAB — MAGNESIUM: Magnesium: 2 mg/dL (ref 1.7–2.4)

## 2024-02-24 MED ORDER — FLUCONAZOLE 150 MG PO TABS: 150.0000 mg | ORAL_TABLET | Freq: Once | ORAL | Status: DC

## 2024-02-24 NOTE — Progress Notes (Signed)
 PROGRESS NOTE    Patricia Jackson  FMW:[id number] DOB: 1940/09/04 DOA: 02/22/2024 PCP: Shona Norleen PEDLAR, MD   Brief Narrative:    Patricia Jackson  is a 83 y.o. female,  with medical history significant of hypertension, hyperlipidemia, hypothyroidism, atrial fibrillation on chronic anticoagulation, COPD,  chronic kidney disease, anemia of chronic kidney disease and chronic GI bleed who presents to the emergency department with his, polyuria, poor appetite and back pain, patient with recent hospitalization discharge 01/28/2024, with pneumonia and pyelonephritis, patient reports she was in her usual state of health, to have intermittent pain across her lower back and for the past 2 days she has been feeling worse.  Patient was admitted with sepsis, POA secondary to UTI and has been empirically started on IV Rocephin .  She is also noted to have AKI on CKD stage IV.  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, pLCX   Paroxysmal atrial fibrillation (HCC)   Chronic kidney disease, stage 4 (severe) (HCC)  Assessment and Plan:   Sepsis, POA UTI with Ecoli bacteremia - sepsis, POA, fever 103.5, hypotensive 78/44, tachycardic and tachypneic with elevated lactic acid - No acute finding on CT chest/abdomen/pelvis, x-ray with no evidence of pneumonia - As positive, she does report urinary symptoms - Continue with IV Rocephin , follow on urine cultures and blood cultures - She is with recent hospitalization for acute pyelonephritis, even though urine cultures were negative then, we will need prolonged antibiotic course treatment for complicated UTI (7 days) -Awaiting further sensitivities - Viral panel negative   Acute kidney injury superimposed on stage IV CKD-improving - Secondary to dehydration and volume depletion - Hold further IV fluid and  monitor   Atrial fibrillation Continue with amiodarone  and Eliquis     Hypertension -Hold amlodipine  and losartan as blood pressure soft on presentation   Hyperlipidemia - Continue with home statin    COPD No wheezing, continue with home meds and as needed albuterol    Anemia of chronic disease-stable Macrocytic anemia - B12 738 -Folate 17.7 -Continue to monitor CBC   Thrombocytopenia-stable -this appears to be chronic, but platelet lower than baseline this admission, most likely in the setting of sepsis    DVT prophylaxis:apixaban  Code Status: Full Family Communication: None at bedside Disposition Plan:  Status is: Inpatient Remains inpatient appropriate because: Need for IV medications.   Consultants:  None  Procedures:  None  Antimicrobials:  Anti-infectives (From admission, onward)    Start     Dose/Rate Route Frequency Ordered Stop   02/23/24 1400  cefTRIAXone  (ROCEPHIN ) 1 g in sodium chloride  0.9 % 100 mL IVPB  Status:  Discontinued        1 g 200 mL/hr over 30 Minutes Intravenous Every 24 hours 02/22/24 2049 02/23/24 0756   02/23/24 1200  cefTRIAXone  (ROCEPHIN ) 2 g in sodium chloride  0.9 % 100 mL IVPB        2 g 200 mL/hr over 30 Minutes Intravenous Every 24 hours 02/23/24 0756     02/22/24 1930  cefTRIAXone  (ROCEPHIN ) 1 g in sodium chloride  0.9 % 100 mL IVPB        1 g 200 mL/hr over 30 Minutes Intravenous  Once 02/22/24 1926 02/22/24 2048      Subjective: Patient seen and evaluated today with no new acute complaints or concerns. No acute concerns or events noted overnight.  Objective: Vitals:   02/23/24 1553 02/23/24 2006 02/24/24 0451 02/24/24 0933  BP: (!) 108/59 122/65 (!) 142/72 130/68  Pulse: 63 70 68 72  Resp:  18 15   Temp: 98.5 F (36.9 C) 98.3 F (36.8 C) 98.4 F (36.9 C)   TempSrc: Oral     SpO2: 92% 92% 93% 93%  Weight:      Height:        Intake/Output Summary (Last 24 hours) at 02/24/2024 1159 Last data filed at 02/24/2024  1146 Gross per 24 hour  Intake 1847.22 ml  Output 1800 ml  Net 47.22 ml   Filed Weights   02/22/24 1209  Weight: 59 kg    Examination:  General exam: Appears calm and comfortable  Respiratory system: Clear to auscultation. Respiratory effort normal. Cardiovascular system: S1 & S2 heard, RRR.  Gastrointestinal system: Abdomen is soft Central nervous system: Alert and awake Extremities: No edema Skin: No significant lesions noted Psychiatry: Flat affect.    Data Reviewed: I have personally reviewed following labs and imaging studies  CBC: Recent Labs  Lab 02/22/24 1250 02/23/24 0437 02/24/24 0444  WBC 8.4 11.8* 8.8  NEUTROABS 7.3  --   --   HGB 10.3* 9.5* 9.1*  HCT 33.2* 30.9* 28.7*  MCV 102.5* 102.7* 100.0  PLT 81* 71* 75*   Basic Metabolic Panel: Recent Labs  Lab 02/22/24 1250 02/22/24 2011 02/23/24 0437 02/24/24 0444  NA 138  --  141 140  K 4.2  --  4.1 3.9  CL 105  --  109 109  CO2 21*  --  21* 22  GLUCOSE 127*  --  86 109*  BUN 27*  --  24* 29*  CREATININE 1.65*  --  1.49* 1.48*  CALCIUM  9.0  --  8.5* 8.7*  MG  --  1.9  --  2.0  PHOS  --  3.3  --   --    GFR: Estimated Creatinine Clearance: 28.2 mL/min (A) (by C-G formula based on SCr of 1.48 mg/dL (H)). Liver Function Tests: Recent Labs  Lab 02/22/24 1250  AST 39  ALT 18  ALKPHOS 71  BILITOT 0.6  PROT 6.6  ALBUMIN  3.6   Recent Labs  Lab 02/22/24 1250  LIPASE 56*   No results for input(s): AMMONIA in the last 168 hours. Coagulation Profile: No results for input(s): INR, PROTIME in the last 168 hours. Cardiac Enzymes: No results for input(s): CKTOTAL, CKMB, CKMBINDEX, TROPONINI in the last 168 hours. BNP (last 3 results) No results for input(s): PROBNP in the last 8760 hours. HbA1C: No results for input(s): HGBA1C in the last 72 hours. CBG: No results for input(s): GLUCAP in the last 168 hours. Lipid Profile: No results for input(s): CHOL, HDL, LDLCALC,  TRIG, CHOLHDL, LDLDIRECT in the last 72 hours. Thyroid  Function Tests: No results for input(s): TSH, T4TOTAL, FREET4, T3FREE, THYROIDAB in the last 72 hours. Anemia Panel: Recent Labs    02/22/24 2011  VITAMINB12 738  FOLATE 17.7   Sepsis Labs: Recent Labs  Lab 02/22/24 1857 02/22/24 2011  LATICACIDVEN 2.9* 1.3    Recent Results (from the past 240 hours)  Resp panel by RT-PCR (RSV, Flu A&B, Covid) Anterior Nasal Swab     Status: None   Collection Time: 02/22/24  3:30 PM   Specimen: Anterior Nasal Swab  Result Value Ref Range Status   SARS Coronavirus 2 by RT PCR NEGATIVE NEGATIVE Final    Comment: (NOTE) SARS-CoV-2 target nucleic acids are NOT DETECTED.  The SARS-CoV-2 RNA is generally detectable in upper respiratory specimens during the acute phase of infection. The lowest concentration of SARS-CoV-2 viral copies this assay can detect is 138 copies/mL. A negative result does not preclude SARS-Cov-2 infection and should not be used as the sole basis for treatment or other patient management decisions. A negative result may occur with  improper specimen collection/handling, submission of specimen other than nasopharyngeal swab, presence of viral mutation(s) within the areas targeted by this assay, and inadequate number of viral copies(<138 copies/mL). A negative result must be combined with clinical observations, patient history, and epidemiological information. The expected result is Negative.  Fact Sheet for Patients:  bloggercourse.com  Fact Sheet for Healthcare Providers:  seriousbroker.it  This test is no t yet approved or cleared by the [place]  FDA and  has been authorized for detection and/or diagnosis of SARS-CoV-2 by FDA under an Emergency Use Authorization (EUA). This EUA will remain  in effect (meaning this test can be used) for the duration of the COVID-19 declaration under Section  564(b)(1) of the Act, 21 U.S.C.section 360bbb-3(b)(1), unless the authorization is terminated  or revoked sooner.       Influenza A by PCR NEGATIVE NEGATIVE Final   Influenza B by PCR NEGATIVE NEGATIVE Final    Comment: (NOTE) The Xpert Xpress SARS-CoV-2/FLU/RSV plus assay is intended as an aid in the diagnosis of influenza from Nasopharyngeal swab specimens and should not be used as a sole basis for treatment. Nasal washings and aspirates are unacceptable for Xpert Xpress SARS-CoV-2/FLU/RSV testing.  Fact Sheet for Patients: bloggercourse.com  Fact Sheet for Healthcare Providers: seriousbroker.it  This test is not yet approved or cleared by the [place]  FDA and has been authorized for detection and/or diagnosis of SARS-CoV-2 by FDA under an Emergency Use Authorization (EUA). This EUA will remain in effect (meaning this test can be used) for the duration of the COVID-19 declaration under Section 564(b)(1) of the Act, 21 U.S.C. section 360bbb-3(b)(1), unless the authorization is terminated or revoked.     Resp Syncytial Virus by PCR NEGATIVE NEGATIVE Final    Comment: (NOTE) Fact Sheet for Patients: bloggercourse.com  Fact Sheet for Healthcare Providers: seriousbroker.it  This test is not yet approved or cleared by the [place]  FDA and has been authorized for detection and/or diagnosis of SARS-CoV-2 by FDA under an Emergency Use Authorization (EUA). This EUA will remain in effect (meaning this test can be used) for the duration of the COVID-19 declaration under Section 564(b)(1) of the Act, 21 U.S.C. section 360bbb-3(b)(1), unless the authorization is terminated or revoked.  Performed at [facility], [address], [place], [place] [address]   Urine Culture     Status: Abnormal (Preliminary result)   Collection Time: 02/22/24  4:18 PM   Specimen: Urine, Clean  Catch  Result Value Ref Range Status   Specimen Description   Final    URINE, CLEAN CATCH Performed at [facility], [address], [place], [place] [address]    Special Requests   Final    NONE Performed at [facility], [address] [place], [place] [address]    Culture (A)  Final    >=100,000 COLONIES/mL ESCHERICHIA COLI SUSCEPTIBILITIES TO FOLLOW Performed at [facility], [address] [place], [place] [address]    Report Status PENDING  Incomplete  Blood culture (routine x 2)     Status: Abnormal (Preliminary result)   Collection Time: 02/22/24  6:57 PM   Specimen: BLOOD  Result Value Ref Range Status   Specimen Description   Final    BLOOD LEFT ANTECUBITAL Performed at [facility], [address], [place], [place] [address]    Special Requests   Final    BOTTLES DRAWN AEROBIC AND ANAEROBIC Blood Culture adequate volume Performed at [facility], [address], [place], [place] [address]    Culture  Setup Time   Final    ANAEROBIC BOTTLE ONLY IN BOTH AEROBIC AND ANAEROBIC BOTTLES Gram Stain Report Called to,Read Back By and Verified With: J THOMPSON AT 0755 ON 11.20.25 BY ADGER J Organism ID to follow    Culture (A)  Final    ESCHERICHIA COLI SUSCEPTIBILITIES TO FOLLOW Performed at [facility], [address] [place], [place] [address]    Report Status PENDING  Incomplete  Blood Culture ID Panel (Reflexed)     Status: Abnormal   Collection Time: 02/22/24  6:57 PM  Result Value Ref Range Status   Enterococcus faecalis NOT DETECTED NOT DETECTED Final   Enterococcus Faecium NOT DETECTED NOT DETECTED Final   Listeria monocytogenes NOT DETECTED NOT DETECTED Final   Staphylococcus species NOT DETECTED NOT DETECTED Final   Staphylococcus aureus (BCID) NOT DETECTED NOT DETECTED Final   Staphylococcus epidermidis NOT DETECTED NOT DETECTED Final   Staphylococcus lugdunensis NOT DETECTED NOT DETECTED Final   Streptococcus species NOT DETECTED NOT  DETECTED Final   Streptococcus agalactiae NOT DETECTED NOT DETECTED Final   Streptococcus pneumoniae NOT DETECTED NOT DETECTED Final   Streptococcus pyogenes NOT DETECTED NOT DETECTED Final   A.calcoaceticus-baumannii NOT DETECTED NOT DETECTED Final   Bacteroides fragilis NOT DETECTED NOT DETECTED Final   Enterobacterales DETECTED (A) NOT DETECTED Final    Comment: Enterobacterales represent a large order of gram negative bacteria, not a single organism. CRITICAL RESULT CALLED TO, READ BACK BY AND VERIFIED WITHBETHA CANDIE SOUR PHARMD, AT 1437 02/23/24 D. VANHOOK    Enterobacter cloacae complex NOT DETECTED NOT DETECTED Final   Escherichia coli DETECTED (A) NOT DETECTED Final    Comment: CRITICAL RESULT CALLED TO, READ BACK BY AND VERIFIED WITHBETHA CANDIE SOUR PHARMD, AT 1437 02/23/24 D. VANHOOK    Klebsiella aerogenes NOT DETECTED NOT DETECTED Final   Klebsiella oxytoca NOT DETECTED NOT DETECTED Final   Klebsiella pneumoniae NOT DETECTED NOT DETECTED Final   Proteus species NOT DETECTED NOT DETECTED Final   Salmonella species NOT DETECTED NOT DETECTED Final   Serratia marcescens NOT DETECTED NOT DETECTED Final   Haemophilus influenzae NOT DETECTED NOT DETECTED Final   Neisseria meningitidis NOT DETECTED NOT DETECTED Final   Pseudomonas aeruginosa NOT DETECTED NOT DETECTED Final   Stenotrophomonas maltophilia NOT DETECTED NOT DETECTED Final   Candida albicans NOT DETECTED NOT DETECTED Final   Candida auris NOT DETECTED NOT DETECTED Final   Candida glabrata NOT DETECTED NOT DETECTED Final   Candida krusei NOT DETECTED NOT DETECTED Final   Candida parapsilosis NOT DETECTED NOT DETECTED Final   Candida tropicalis NOT DETECTED NOT DETECTED Final   Cryptococcus neoformans/gattii NOT DETECTED NOT DETECTED Final   CTX-M ESBL NOT DETECTED NOT DETECTED Final   Carbapenem resistance IMP NOT DETECTED NOT DETECTED Final   Carbapenem resistance KPC NOT DETECTED NOT DETECTED Final   Carbapenem resistance  NDM NOT DETECTED NOT DETECTED Final   Carbapenem resist OXA 48 LIKE NOT DETECTED NOT DETECTED Final   Carbapenem resistance VIM NOT DETECTED NOT DETECTED Final    Comment: Performed at [facility], [address],  [place], [place] [address]  Blood culture (routine x 2)     Status: Abnormal (Preliminary result)   Collection Time: 02/22/24  8:11 PM   Specimen: BLOOD  Result Value Ref Range Status   Specimen Description   Final    BLOOD BLOOD LEFT HAND Performed at [facility], [address] [place], [place] [address]    Special Requests   Final    BOTTLES DRAWN AEROBIC AND ANAEROBIC Blood Culture adequate volume Performed at [facility], [address], [place], [place] [address]    Culture  Setup Time   Final    ANAEROBIC BOTTLE ONLY IN BOTH AEROBIC AND ANAEROBIC BOTTLES Gram Stain Report Called to,Read Back By and Verified With: J THOMPSON AT 0855 ON 11.02.25 BY ADGER J Performed at [facility], [address], [place], [place] [address]    Culture ESCHERICHIA COLI (A)  Final   Report Status PENDING  Incomplete  Respiratory (~20 pathogens) panel by PCR     Status: None   Collection Time: 02/22/24  8:51 PM   Specimen: Nasopharyngeal Swab; Respiratory  Result Value Ref Range Status   Adenovirus NOT DETECTED NOT DETECTED Final   Coronavirus 229E NOT DETECTED NOT DETECTED Final    Comment: (NOTE) The Coronavirus on the Respiratory Panel, DOES NOT test for the novel  Coronavirus (2019 nCoV)    Coronavirus HKU1 NOT DETECTED NOT DETECTED Final   Coronavirus NL63 NOT DETECTED NOT DETECTED Final   Coronavirus OC43 NOT DETECTED NOT DETECTED Final   Metapneumovirus NOT DETECTED NOT DETECTED Final   Rhinovirus / Enterovirus NOT DETECTED NOT DETECTED Final   Influenza A NOT DETECTED NOT DETECTED Final   Influenza B NOT DETECTED NOT DETECTED Final   Parainfluenza Virus 1 NOT DETECTED NOT DETECTED Final   Parainfluenza Virus 2 NOT DETECTED NOT DETECTED Final   Parainfluenza Virus  3 NOT DETECTED NOT DETECTED Final   Parainfluenza Virus 4 NOT DETECTED NOT DETECTED Final   Respiratory Syncytial Virus NOT DETECTED NOT DETECTED Final   Bordetella pertussis NOT DETECTED NOT DETECTED Final   Bordetella Parapertussis NOT DETECTED NOT DETECTED Final   Chlamydophila pneumoniae NOT DETECTED NOT DETECTED Final   Mycoplasma pneumoniae NOT DETECTED NOT DETECTED Final    Comment: Performed at [facility], [address] [place], [place] [address]         Radiology Studies: DG Chest Portable 1 View Result Date: 02/22/2024 CLINICAL DATA:  Intermittent lower to mid back pain. EXAM: PORTABLE CHEST 1 VIEW COMPARISON:  January 27, 2024 FINDINGS: The heart size and mediastinal contours are within normal limits. There is marked severity calcification of the thoracic aorta. The lungs are hyperinflated. Moderate severity diffuse, chronic appearing increased interstitial lung markings are seen. Mild areas of scarring and/or atelectasis are noted within the bilateral lung bases. No pleural effusion or pneumothorax is identified. The visualized skeletal structures are unremarkable. IMPRESSION: 1. COPD with chronic appearing increased interstitial lung markings. 2. Mild bibasilar scarring and/or atelectasis. Electronically Signed   By: Suzen Dials M.D.   On: 02/22/2024 19:34   CT L-SPINE NO CHARGE Result Date: 02/22/2024 CLINICAL DATA:  Low back pain for 2 weeks. EXAM: CT LUMBAR SPINE WITHOUT CONTRAST TECHNIQUE: Multidetector CT imaging of the lumbar spine was performed without intravenous contrast administration. Multiplanar CT image reconstructions were also generated. RADIATION DOSE REDUCTION: This exam was performed according to the departmental dose-optimization program which includes automated exposure control, adjustment of the mA and/or kV according to patient size and/or  use of iterative reconstruction technique. COMPARISON:  CT scan 05/09/2022 FINDINGS: Segmentation: There  are five lumbar type vertebral bodies. The last full intervertebral disc space is labeled L5-S1. Alignment: Normal Vertebrae: Age related osteoporosis but no bone lesions or fractures. Paraspinal and other soft tissues: Advanced aortic and branch vessel calcifications but no aneurysm. No retroperitoneal adenopathy or mass. No significant paraspinal findings. Disc levels: T12-L1: No significant findings. L1-2: No significant findings. L2-3: Broad-based bulging degenerated annulus, osteophytic ridging, facet disease and ligamentum flavum thickening all contributing to severe spinal and bilateral lateral recess stenosis which appears relatively stable. Mild bilateral foraminal stenosis is also stable. L3-4: Diffuse bulging annulus, facet disease and ligamentum flavum thickening contributing to moderate to moderately severe spinal and bilateral lateral recess stenosis, unchanged. No foraminal stenosis. L4-5: Bulging annulus, facet disease and ligamentum flavum thickening contributing to moderate spinal and bilateral lateral recess stenosis. No foraminal stenosis. L5-S1: Facet disease but no disc protrusions, significant spinal or foraminal stenosis. IMPRESSION: 1. Age related osteoporosis but no bone lesions or fractures. 2. 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 No significant change. 3. Moderate to moderately severe multifactorial spinal and bilateral lateral recess stenosis at L3-4, unchanged. 4. Moderate spinal and bilateral lateral recess stenosis at L4-5, unchanged. 5. Aortic atherosclerosis. Aortic Atherosclerosis (ICD10-I70.0). Electronically Signed   By: MYRTIS Stammer M.D.   On: 02/22/2024 15:43   CT ABDOMEN PELVIS WO CONTRAST Result Date: 02/22/2024 CLINICAL DATA:  Abdominal and back pain for 2 weeks. EXAM: CT ABDOMEN AND PELVIS WITHOUT CONTRAST TECHNIQUE: Multidetector CT imaging of the abdomen and pelvis was performed following the standard protocol without IV  contrast. RADIATION DOSE REDUCTION: This exam was performed according to the departmental dose-optimization program which includes automated exposure control, adjustment of the mA and/or kV according to patient size and/or use of iterative reconstruction technique. COMPARISON:  01/27/2024 FINDINGS: Lower chest: Stable aortic atherosclerotic calcifications. The lung bases are clear of an acute process. Stable basilar scarring changes. Hepatobiliary: No hepatic lesions or intrahepatic biliary dilatation. Suspect small gallstones in the gallbladder. No findings for acute cholecystitis. No common bile duct dilatation. Pancreas: No mass, inflammation or ductal dilatation. Age related pancreatic atrophy. Spleen: Normal size.  No focal lesions. Adrenals/Urinary Tract: The adrenal glands are normal. Stable left renal cysts. No worrisome renal lesions are identified without contrast. No renal calculi or obstructing ureteral calculi. No bladder mass or bladder calculi. Mild cystocele noted. Stomach/Bowel: The stomach, duodenum, small bowel and colon are grossly normal without oral contrast. No acute inflammatory process, mass lesions or obstructive findings. Stable colonic diverticulosis. Vascular/Lymphatic: Stable advanced atherosclerotic calcification involving the aorta and branch vessels but no aneurysm. No mesenteric or retroperitoneal mass or adenopathy. Reproductive: The uterus is unremarkable.  No adnexal mass. Other: No pelvic mass or adenopathy. No free pelvic fluid collections. No inguinal mass or adenopathy. No abdominal wall hernia or subcutaneous lesions. Musculoskeletal: No significant bony findings. No lumbar compression fracture. Age related osteopenia. IMPRESSION: 1. No acute abdominal/pelvic findings, mass lesions or adenopathy. 2. No renal, ureteral or bladder calculi or mass. 3. Stable left renal cysts. 4. Suspect small gallstones in the gallbladder. No findings for acute cholecystitis. 5. Stable colonic  diverticulosis without findings for acute diverticulitis. 6. Stable advanced atherosclerotic calcification involving the aorta and branch vessels. 7. Aortic atherosclerosis. Aortic Atherosclerosis (ICD10-I70.0). Electronically Signed   By: MYRTIS Stammer M.D.   On: 02/22/2024 15:33        Scheduled Meds:  amiodarone   200 mg  Oral Daily   apixaban   2.5 mg Oral BID   budesonide -glycopyrrolate -formoterol   2 puff Inhalation BID   levothyroxine   75 mcg Oral QAC breakfast   lubiprostone   24 mcg Oral BID WC   pantoprazole   40 mg Oral BID   sertraline   25 mg Oral Daily   timolol   1 drop Right Eye BID   traZODone   50 mg Oral QHS   Continuous Infusions:  cefTRIAXone  (ROCEPHIN )  IV 2 g (02/24/24 1149)     LOS: 2 days    Time spent: 55 minutes    Kinney Sackmann D Maree, DO [facility]  If 7PM-7AM, please contact night-coverage [email or web address] 02/24/2024, 11:59 AM

## 2024-02-24 NOTE — Care Management Important Message (Signed)
 Important Message  Patient Details  Name: Patricia Jackson MRN: [id number] Date of Birth: 1940/07/16   Important Message Given:  Yes - Medicare IM     Denise Bramblett L Leidi Astle 02/24/2024, 12:08 PM

## 2024-02-24 NOTE — Plan of Care (Signed)
   Problem: Education: Goal: Knowledge of General Education information will improve Description Including pain rating scale, medication(s)/side effects and non-pharmacologic comfort measures Outcome: Progressing   Problem: Health Behavior/Discharge Planning: Goal: Ability to manage health-related needs will improve Outcome: Progressing

## 2024-02-24 NOTE — Plan of Care (Signed)

## 2024-02-24 NOTE — Plan of Care (Signed)
   Problem: Health Behavior/Discharge Planning: Goal: Ability to manage health-related needs will improve Outcome: Progressing

## 2024-02-25 DIAGNOSIS — A419 Sepsis, unspecified organism: Secondary | ICD-10-CM | POA: Diagnosis not present

## 2024-02-25 LAB — BASIC METABOLIC PANEL WITH GFR
Anion gap: 10 (ref 5–15)
BUN: 19 mg/dL (ref 8–23)
CO2: 24 mmol/L (ref 22–32)
Calcium: 8.9 mg/dL (ref 8.9–10.3)
Chloride: 109 mmol/L (ref 98–111)
Creatinine, Ser: 1.29 mg/dL — ABNORMAL HIGH (ref 0.44–1.00)
GFR, Estimated: 41 mL/min — ABNORMAL LOW (ref >60)
Glucose, Bld: 80 mg/dL (ref 70–99)
Potassium: 4 mmol/L (ref 3.5–5.1)
Sodium: 142 mmol/L (ref 135–145)

## 2024-02-25 LAB — CULTURE, BLOOD (ROUTINE X 2)
Special Requests: ADEQUATE
Special Requests: ADEQUATE

## 2024-02-25 LAB — MAGNESIUM: Magnesium: 2.1 mg/dL (ref 1.7–2.4)

## 2024-02-25 LAB — CBC
HCT: 31.8 % — ABNORMAL LOW (ref 36.0–46.0)
Hemoglobin: 10.1 g/dL — ABNORMAL LOW (ref 12.0–15.0)
MCH: 31.9 pg (ref 26.0–34.0)
MCHC: 31.8 g/dL (ref 30.0–36.0)
MCV: 100.3 fL — ABNORMAL HIGH (ref 80.0–100.0)
Platelets: 79 K/uL — ABNORMAL LOW (ref 150–400)
RBC: 3.17 MIL/uL — ABNORMAL LOW (ref 3.87–5.11)
RDW: 18.5 % — ABNORMAL HIGH (ref 11.5–15.5)
WBC: 6.2 K/uL (ref 4.0–10.5)
nRBC: 0 % (ref 0.0–0.2)

## 2024-02-25 LAB — URINE CULTURE: Culture: >=100000 — AB

## 2024-02-25 MED ORDER — POLYETHYLENE GLYCOL 3350 17 G PO PACK
17.0000 g | PACK | Freq: Every day | ORAL | Status: DC
  Administered 2024-02-25: 17 g via ORAL
  Filled 2024-02-25: qty 1

## 2024-02-25 MED ORDER — CIPROFLOXACIN HCL 500 MG PO TABS: 500.0000 mg | ORAL_TABLET | Freq: Two times a day (BID) | ORAL | 0 refills | Status: AC

## 2024-02-25 NOTE — Plan of Care (Signed)
   Problem: Education: Goal: Knowledge of General Education information will improve Description Including pain rating scale, medication(s)/side effects and non-pharmacologic comfort measures Outcome: Progressing   Problem: Health Behavior/Discharge Planning: Goal: Ability to manage health-related needs will improve Outcome: Progressing

## 2024-02-25 NOTE — Discharge Summary (Signed)
 Physician Discharge Summary  Patricia Jackson FMW:[id number] DOB: 04/03/1941 DOA: 02/22/2024  PCP: Shona Norleen PEDLAR, MD  Admit date: 02/22/2024  Discharge date: 02/25/2024  Admitted From:Home  Disposition:  Home  Recommendations for Outpatient Follow-up:  Follow up with PCP in 1-2 weeks Remain on ciprofloxacin  as prescribed for 4 more days to complete course of treatment for E. coli bacteremia/UTI Continue other home medications as prior  Home Health: Declined home health PT  Equipment/Devices: None  Discharge Condition:Stable  CODE STATUS: Full  Diet recommendation: Heart Healthy  Brief/Interim Summary: Patricia Jackson  is a 83 y.o. female,  with medical history significant of hypertension, hyperlipidemia, hypothyroidism, atrial fibrillation on chronic anticoagulation, COPD,  chronic kidney disease, anemia of chronic kidney disease and chronic GI bleed who presents to the emergency department with his, polyuria, poor appetite and back pain, patient with recent hospitalization discharge 01/28/2024, with pneumonia and pyelonephritis, patient reports she was in her usual state of health, to have intermittent pain across her lower back and for the past 2 days she has been feeling worse.  Patient was admitted with sepsis, POA secondary to UTI and had been empirically started on IV Rocephin .  She was also noted to have AKI on CKD stage IV which has now resolved with the use of IV fluid.  She no longer has any further fevers and vital signs are stable and she has responded well to IV antibiotics with noted susceptibility.  She will transition to oral ciprofloxacin  for 4 more days to complete total course of treatment.  No other acute events or concerns noted.  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 coli UTI with associated bacteremia.  AKI on CKD stage IV.  Discharge Instructions  Discharge Instructions     Diet - low sodium heart healthy   Complete by: As directed    Increase activity slowly   Complete by: As directed       Allergies as of 02/25/2024       Reactions   Amoxicillin -pot Clavulanate Diarrhea   Povidone-iodine Other (See Comments)   Burning   Sulfa Antibiotics Other (See Comments)   Unknown    Sulfamethoxazole Other (See Comments)   Unknown         Medication List     TAKE these medications    acetaminophen  500 MG tablet Commonly known as: TYLENOL  Take 1,000 mg by mouth every 8 (eight) hours as needed for mild pain or moderate pain.   albuterol  (2.5 MG/3ML) 0.083% nebulizer solution Commonly known as: PROVENTIL  Take 2.5 mg by nebulization every 6 (six) hours as needed for shortness of breath or wheezing.   albuterol  108 (90 Base) MCG/ACT inhaler Commonly known as: VENTOLIN  HFA Inhale 2 puffs into the lungs every 4 (four) hours as needed for wheezing or shortness of breath.   amiodarone  200 MG tablet Commonly known as: PACERONE  Take 1 tablet (200 mg total) by mouth daily. Take 1 Tablet ( 200 mg ) Daily and none on Sunday   amLODipine  5 MG tablet Commonly known as: NORVASC  Take 5 mg by mouth daily.   apixaban  2.5 MG Tabs tablet Commonly known as: Eliquis  Take 1 tablet (2.5 mg total) by mouth 2 (  two) times daily.   ciprofloxacin  500 MG tablet Commonly known as: Cipro  Take 1 tablet (500 mg total) by mouth 2 (two) times daily for 4 days.   ferrous sulfate  324 MG Tbec Take 324 mg by mouth daily with breakfast.   levothyroxine  75 MCG tablet Commonly known as: SYNTHROID  Take 75 mcg by mouth daily before breakfast.   losartan 25 MG tablet Commonly known as: COZAAR Take 25 mg by mouth at bedtime.    lubiprostone  24 MCG capsule Commonly known as: AMITIZA  Take 1 capsule (24 mcg total) by mouth 2 (two) times daily with a meal.   nitroGLYCERIN  0.4 MG SL tablet Commonly known as: NITROSTAT  Place 1 tablet (0.4 mg total) under the tongue every 5 (five) minutes as needed for chest pain.   ondansetron  4 MG disintegrating tablet Commonly known as: ZOFRAN -ODT Take 4 mg by mouth 2 (two) times daily.   pantoprazole  40 MG tablet Commonly known as: Protonix  Take 1 tablet (40 mg total) by mouth 2 (two) times daily.   rosuvastatin  20 MG tablet Commonly known as: CRESTOR  Take 20 mg by mouth daily.   sertraline  25 MG tablet Commonly known as: ZOLOFT  Take 25 mg by mouth daily.   timolol  0.5 % ophthalmic solution Commonly known as: TIMOPTIC  Place 1 drop into the right eye 2 (two) times daily.   traZODone  50 MG tablet Commonly known as: DESYREL  Take 50 mg by mouth at bedtime.   Trelegy Ellipta 200-62.5-25 MCG/ACT Aepb Generic drug: Fluticasone -Umeclidin-Vilant Inhale 1 puff into the lungs daily.        Follow-up Information     Shona Norleen PEDLAR, MD. Schedule an appointment as soon as possible for a visit in 1 week(s).   Specialty: Internal Medicine Contact information: [address] [place] [address] [phone]                Allergies  Allergen Reactions   Amoxicillin -Pot Clavulanate Diarrhea   Povidone-Iodine Other (See Comments)    Burning   Sulfa Antibiotics Other (See Comments)    Unknown    Sulfamethoxazole Other (See Comments)    Unknown     Consultations: None   Procedures/Studies: DG Chest Portable 1 View Result Date: 02/22/2024 CLINICAL DATA:  Intermittent lower to mid back pain. EXAM: PORTABLE CHEST 1 VIEW COMPARISON:  January 27, 2024 FINDINGS: The heart size and mediastinal contours are within normal limits. There is marked severity calcification of the thoracic aorta. The lungs are hyperinflated. Moderate severity diffuse, chronic  appearing increased interstitial lung markings are seen. Mild areas of scarring and/or atelectasis are noted within the bilateral lung bases. No pleural effusion or pneumothorax is identified. The visualized skeletal structures are unremarkable. IMPRESSION: 1. COPD with chronic appearing increased interstitial lung markings. 2. Mild bibasilar scarring and/or atelectasis. Electronically Signed   By: Suzen Dials M.D.   On: 02/22/2024 19:34   CT L-SPINE NO CHARGE Result Date: 02/22/2024 CLINICAL DATA:  Low back pain for 2 weeks. EXAM: CT LUMBAR SPINE WITHOUT CONTRAST TECHNIQUE: Multidetector CT imaging of the lumbar spine was performed without intravenous contrast administration. Multiplanar CT image reconstructions were also generated. RADIATION DOSE REDUCTION: This exam was performed according to the departmental dose-optimization program which includes automated exposure control, adjustment of the mA and/or kV according to patient size and/or use of iterative reconstruction technique. COMPARISON:  CT scan 05/09/2022 FINDINGS: Segmentation: There are five lumbar type vertebral bodies. The last full intervertebral disc space is labeled L5-S1. Alignment: Normal Vertebrae: Age related  osteoporosis but no bone lesions or fractures. Paraspinal and other soft tissues: Advanced aortic and branch vessel calcifications but no aneurysm. No retroperitoneal adenopathy or mass. No significant paraspinal findings. Disc levels: T12-L1: No significant findings. L1-2: No significant findings. L2-3: Broad-based bulging degenerated annulus, osteophytic ridging, facet disease and ligamentum flavum thickening all contributing to severe spinal and bilateral lateral recess stenosis which appears relatively stable. Mild bilateral foraminal stenosis is also stable. L3-4: Diffuse bulging annulus, facet disease and ligamentum flavum thickening contributing to moderate to moderately severe spinal and bilateral lateral recess stenosis,  unchanged. No foraminal stenosis. L4-5: Bulging annulus, facet disease and ligamentum flavum thickening contributing to moderate spinal and bilateral lateral recess stenosis. No foraminal stenosis. L5-S1: Facet disease but no disc protrusions, significant spinal or foraminal stenosis. IMPRESSION: 1. Age related osteoporosis but no bone lesions or fractures. 2. 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 No significant change. 3. Moderate to moderately severe multifactorial spinal and bilateral lateral recess stenosis at L3-4, unchanged. 4. Moderate spinal and bilateral lateral recess stenosis at L4-5, unchanged. 5. Aortic atherosclerosis. Aortic Atherosclerosis (ICD10-I70.0). Electronically Signed   By: MYRTIS Stammer M.D.   On: 02/22/2024 15:43   CT ABDOMEN PELVIS WO CONTRAST Result Date: 02/22/2024 CLINICAL DATA:  Abdominal and back pain for 2 weeks. EXAM: CT ABDOMEN AND PELVIS WITHOUT CONTRAST TECHNIQUE: Multidetector CT imaging of the abdomen and pelvis was performed following the standard protocol without IV contrast. RADIATION DOSE REDUCTION: This exam was performed according to the departmental dose-optimization program which includes automated exposure control, adjustment of the mA and/or kV according to patient size and/or use of iterative reconstruction technique. COMPARISON:  01/27/2024 FINDINGS: Lower chest: Stable aortic atherosclerotic calcifications. The lung bases are clear of an acute process. Stable basilar scarring changes. Hepatobiliary: No hepatic lesions or intrahepatic biliary dilatation. Suspect small gallstones in the gallbladder. No findings for acute cholecystitis. No common bile duct dilatation. Pancreas: No mass, inflammation or ductal dilatation. Age related pancreatic atrophy. Spleen: Normal size.  No focal lesions. Adrenals/Urinary Tract: The adrenal glands are normal. Stable left renal cysts. No worrisome renal lesions are identified without  contrast. No renal calculi or obstructing ureteral calculi. No bladder mass or bladder calculi. Mild cystocele noted. Stomach/Bowel: The stomach, duodenum, small bowel and colon are grossly normal without oral contrast. No acute inflammatory process, mass lesions or obstructive findings. Stable colonic diverticulosis. Vascular/Lymphatic: Stable advanced atherosclerotic calcification involving the aorta and branch vessels but no aneurysm. No mesenteric or retroperitoneal mass or adenopathy. Reproductive: The uterus is unremarkable.  No adnexal mass. Other: No pelvic mass or adenopathy. No free pelvic fluid collections. No inguinal mass or adenopathy. No abdominal wall hernia or subcutaneous lesions. Musculoskeletal: No significant bony findings. No lumbar compression fracture. Age related osteopenia. IMPRESSION: 1. No acute abdominal/pelvic findings, mass lesions or adenopathy. 2. No renal, ureteral or bladder calculi or mass. 3. Stable left renal cysts. 4. Suspect small gallstones in the gallbladder. No findings for acute cholecystitis. 5. Stable colonic diverticulosis without findings for acute diverticulitis. 6. Stable advanced atherosclerotic calcification involving the aorta and branch vessels. 7. Aortic atherosclerosis. Aortic Atherosclerosis (ICD10-I70.0). Electronically Signed   By: MYRTIS Stammer M.D.   On: 02/22/2024 15:33   DG Chest Portable 1 View Result Date: 01/27/2024 EXAM: 1 VIEW XRAY OF THE CHEST 01/27/2024 10:05:33 PM COMPARISON: None available. CLINICAL HISTORY: Cough. Pt to er room number one, pt states that her md called her and told her that her heme was 4.1, states  that this will be her 4th or 5th transfusion for lower h/h. Pt states that she is having black and tarry bm. FINDINGS: LUNGS AND PLEURA: The lungs are symmetrically hyperinflated in keeping with changes of underlying COPD. Chronic interstitial thickening. New spiculated nodular opacity within the right upper lung zone, possibly  infectious or inflammatory and acute setting. No pleural effusion. HEART AND MEDIASTINUM: Atherosclerotic calcification within the thoracic aorta. Cardiac size within normal limits. BONES AND SOFT TISSUES: Surgical clips within the left axilla. No acute bony abnormality. IMPRESSION: 1. New spiculated nodular opacity within the right upper lung zone, possibly infectious or inflammatory in the acute setting. Follow-up chest radiograph is recommended in 3-4 weeks, following conservative therapy, to document resolution. If persistent at that time, dedicated contrast-enhanced CT imaging of the chest is recommended for further evaluation. 2. COPD Electronically signed by: Dorethia Molt MD 01/27/2024 10:10 PM EDT RP Workstation: [id number]   CT ABDOMEN PELVIS WO CONTRAST Result Date: 01/27/2024 EXAM: CT ABDOMEN AND PELVIS WITHOUT CONTRAST 01/27/2024 08:34:47 PM TECHNIQUE: CT of the abdomen and pelvis was performed without the administration of intravenous contrast. Multiplanar reformatted images are provided for review. Automated exposure control, iterative reconstruction, and/or weight-based adjustment of the mA/kV was utilized to reduce the radiation dose to as low as reasonably achievable. COMPARISON: 08/23/2021 CLINICAL HISTORY: Abdominal pain, acute, nonlocalized; gen weakness, abd pain, AKI. General abd pain, weakness, DKI.; Pt to er room number one, pt states that her md called her and told her that her heme was 4.1, states that this will be her 4th or 5th transfusion for lower h/h. Pt states that she is having black and tarry bm. FINDINGS: LIMITATIONS/ARTIFACTS: Severely motion-degraded images, limiting evaluation. LOWER CHEST: No acute abnormality. LIVER: The liver is unremarkable. GALLBLADDER AND BILE DUCTS: Gallbladder is unremarkable. No biliary ductal dilatation. SPLEEN: No acute abnormality. PANCREAS: No acute abnormality. ADRENAL GLANDS: No acute abnormality. KIDNEYS, URETERS AND BLADDER: 1.8 cm left  renal cyst (image 19), benign (Bosniak I), no follow up recommended. Stable fullness of the right renal pelvis, favoring extrarenal pelvis over hydronephrosis. Mild right perinephric stranding (image 36), new, suggesting the possibility of pyelonephritis. No stones in the kidneys or ureters. Urinary bladder is unremarkable. GI AND BOWEL: Stomach demonstrates no acute abnormality. Multiple diverticula mainly in the left hemicolon, without signs of diverticulitis. There is no bowel obstruction. PERITONEUM AND RETROPERITONEUM: No ascites. No free air. VASCULATURE: Aorta is normal in caliber. Atherosclerotic calcifications of the abdominal aorta and branch vessels. LYMPH NODES: No lymphadenopathy. REPRODUCTIVE ORGANS: No acute abnormality. BONES AND SOFT TISSUES: No acute osseous abnormality. No focal soft tissue abnormality. IMPRESSION: 1. Severely motion-degraded exam, limiting evaluation. 2. Mild right perinephric stranding, new, suspicious for acute pyelonephritis. 3. Otherwise negative. Electronically signed by: Pinkie Pebbles MD 01/27/2024 08:40 PM EDT RP Workstation: [id number]   DG Abd 1 View Result Date: 01/26/2024 CLINICAL DATA:  pain, right lower quadrant abdominal pain EXAM: ABDOMEN - 1 VIEW COMPARISON:  September 08, 2023 FINDINGS: Nonobstructive bowel gas pattern.No pneumoperitoneum. No organomegaly or radiopaque calculi. No acute fracture or destructive lesion. Multilevel thoracic osteophytosis. IMPRESSION: Nonobstructive bowel gas pattern.  No radiopaque calculi visualized. Electronically Signed   By: Rogelia Myers M.D.   On: 01/26/2024 12:56   US  RENAL Result Date: 01/26/2024 CLINICAL DATA:  RLQ ab pain/+3 blood in urine EXAM: RENAL / URINARY TRACT ULTRASOUND COMPLETE COMPARISON:  08/23/2021, 01/25/2023 FINDINGS: Right Kidney: Renal measurements: 9.3 x 4.1 x 3.8 cm = volume: 74 mL.Normal echogenicity. Interpolar region cyst measuring  1.1 x 1 x 1 cm. No hydronephrosis or nephrolithiasis. Left  Kidney: Renal measurements: 9.7 x 4.4 x 3.9 cm = volume: 88 mL. Normal echogenicity. Upper pole cyst measuring 2 x 2.1 x 1.8 cm. A smaller upper pole cyst is also present measuring 1.1 x 1.2 x 1.2 cm. No hydronephrosis or nephrolithiasis. Bladder: Circumferential wall thickening of the urinary bladder. Other: None. IMPRESSION: 1. No hydronephrosis or nephrolithiasis. 2. Circumferential wall thickening of the urinary bladder, which may be due to underdistension. If there is concern for acute cystitis, correlation with urinalysis would be recommended. Electronically Signed   By: Rogelia Myers M.D.   On: 01/26/2024 12:45     Discharge Exam: Vitals:   02/25/24 0350 02/25/24 0926  BP: (!) 147/83 134/72  Pulse: 66 63  Resp: 18   Temp: 97.8 F (36.6 C) 98.3 F (36.8 C)  SpO2: 94% 96%   Vitals:   02/24/24 1941 02/24/24 1959 02/25/24 0350 02/25/24 0926  BP: 137/70  (!) 147/83 134/72  Pulse: 68  66 63  Resp: 18  18   Temp: 98.7 F (37.1 C)  97.8 F (36.6 C) 98.3 F (36.8 C)  TempSrc: Oral  Oral Oral  SpO2: 93% 93% 94% 96%  Weight:      Height:        General: Pt is alert, awake, not in acute distress Cardiovascular: RRR, S1/S2 +, no rubs, no gallops Respiratory: CTA bilaterally, no wheezing, no rhonchi Abdominal: Soft, NT, ND, bowel sounds + Extremities: no edema, no cyanosis    The results of significant diagnostics from this hospitalization (including imaging, microbiology, ancillary and laboratory) are listed below for reference.     Microbiology: Recent Results (from the past 240 hours)  Resp panel by RT-PCR (RSV, Flu A&B, Covid) Anterior Nasal Swab     Status: None   Collection Time: 02/22/24  3:30 PM   Specimen: Anterior Nasal Swab  Result Value Ref Range Status   SARS Coronavirus 2 by RT PCR NEGATIVE NEGATIVE Final    Comment: (NOTE) SARS-CoV-2 target nucleic acids are NOT DETECTED.  The SARS-CoV-2 RNA is generally detectable in upper respiratory specimens during  the acute phase of infection. The lowest concentration of SARS-CoV-2 viral copies this assay can detect is 138 copies/mL. A negative result does not preclude SARS-Cov-2 infection and should not be used as the sole basis for treatment or other patient management decisions. A negative result may occur with  improper specimen collection/handling, submission of specimen other than nasopharyngeal swab, presence of viral mutation(s) within the areas targeted by this assay, and inadequate number of viral copies(<138 copies/mL). A negative result must be combined with clinical observations, patient history, and epidemiological information. The expected result is Negative.  Fact Sheet for Patients:  bloggercourse.com  Fact Sheet for Healthcare Providers:  seriousbroker.it  This test is no t yet approved or cleared by the [place]  FDA and  has been authorized for detection and/or diagnosis of SARS-CoV-2 by FDA under an Emergency Use Authorization (EUA). This EUA will remain  in effect (meaning this test can be used) for the duration of the COVID-19 declaration under Section 564(b)(1) of the Act, 21 U.S.C.section 360bbb-3(b)(1), unless the authorization is terminated  or revoked sooner.       Influenza A by PCR NEGATIVE NEGATIVE Final   Influenza B by PCR NEGATIVE NEGATIVE Final    Comment: (NOTE) The Xpert Xpress SARS-CoV-2/FLU/RSV plus assay is intended as an aid in the diagnosis of influenza from Nasopharyngeal  swab specimens and should not be used as a sole basis for treatment. Nasal washings and aspirates are unacceptable for Xpert Xpress SARS-CoV-2/FLU/RSV testing.  Fact Sheet for Patients: bloggercourse.com  Fact Sheet for Healthcare Providers: seriousbroker.it  This test is not yet approved or cleared by the [place]  FDA and has been authorized for detection and/or  diagnosis of SARS-CoV-2 by FDA under an Emergency Use Authorization (EUA). This EUA will remain in effect (meaning this test can be used) for the duration of the COVID-19 declaration under Section 564(b)(1) of the Act, 21 U.S.C. section 360bbb-3(b)(1), unless the authorization is terminated or revoked.     Resp Syncytial Virus by PCR NEGATIVE NEGATIVE Final    Comment: (NOTE) Fact Sheet for Patients: bloggercourse.com  Fact Sheet for Healthcare Providers: seriousbroker.it  This test is not yet approved or cleared by the [place]  FDA and has been authorized for detection and/or diagnosis of SARS-CoV-2 by FDA under an Emergency Use Authorization (EUA). This EUA will remain in effect (meaning this test can be used) for the duration of the COVID-19 declaration under Section 564(b)(1) of the Act, 21 U.S.C. section 360bbb-3(b)(1), unless the authorization is terminated or revoked.  Performed at [facility], [address] [place], [place] [address]   Urine Culture     Status: Abnormal   Collection Time: 02/22/24  4:18 PM   Specimen: Urine, Clean Catch  Result Value Ref Range Status   Specimen Description   Final    URINE, CLEAN CATCH Performed at [facility], [address] [place], [place] [address]    Special Requests   Final    NONE Performed at [facility], [address], [place], [place] [address]    Culture >=100,000 COLONIES/mL ESCHERICHIA COLI (A)  Final   Report Status 02/25/2024 FINAL  Final   Organism ID, Bacteria ESCHERICHIA COLI (A)  Final      Susceptibility   Escherichia coli - MIC*    AMPICILLIN >=32 RESISTANT Resistant     CEFAZOLIN  (URINE) Value in next row Sensitive      2 SENSITIVEThis is a modified FDA-approved test that has been validated and its performance characteristics determined by the reporting laboratory.  This laboratory is certified under the Clinical Laboratory Improvement Amendments CLIA  as qualified to perform high complexity clinical laboratory testing.    CEFEPIME Value in next row Sensitive      2 SENSITIVEThis is a modified FDA-approved test that has been validated and its performance characteristics determined by the reporting laboratory.  This laboratory is certified under the Clinical Laboratory Improvement Amendments CLIA as qualified to perform high complexity clinical laboratory testing.    ERTAPENEM Value in next row Sensitive      2 SENSITIVEThis is a modified FDA-approved test that has been validated and its performance characteristics determined by the reporting laboratory.  This laboratory is certified under the Clinical Laboratory Improvement Amendments CLIA as qualified to perform high complexity clinical laboratory testing.    CEFTRIAXONE  Value in next row Sensitive      2 SENSITIVEThis is a modified FDA-approved test that has been validated and its performance characteristics determined by the reporting laboratory.  This laboratory is certified under the Clinical Laboratory Improvement Amendments CLIA as qualified to perform high complexity clinical laboratory testing.    CIPROFLOXACIN  Value in next row Sensitive      2 SENSITIVEThis is a modified FDA-approved test that has been validated and its performance characteristics determined by the reporting laboratory.  This laboratory  is certified under the Clinical Laboratory Improvement Amendments CLIA as qualified to perform high complexity clinical laboratory testing.    GENTAMICIN Value in next row Sensitive      2 SENSITIVEThis is a modified FDA-approved test that has been validated and its performance characteristics determined by the reporting laboratory.  This laboratory is certified under the Clinical Laboratory Improvement Amendments CLIA as qualified to perform high complexity clinical laboratory testing.    NITROFURANTOIN Value in next row Sensitive      2 SENSITIVEThis is a modified FDA-approved test that has  been validated and its performance characteristics determined by the reporting laboratory.  This laboratory is certified under the Clinical Laboratory Improvement Amendments CLIA as qualified to perform high complexity clinical laboratory testing.    TRIMETH/SULFA Value in next row Resistant      2 SENSITIVEThis is a modified FDA-approved test that has been validated and its performance characteristics determined by the reporting laboratory.  This laboratory is certified under the Clinical Laboratory Improvement Amendments CLIA as qualified to perform high complexity clinical laboratory testing.    AMPICILLIN/SULBACTAM Value in next row Intermediate      2 SENSITIVEThis is a modified FDA-approved test that has been validated and its performance characteristics determined by the reporting laboratory.  This laboratory is certified under the Clinical Laboratory Improvement Amendments CLIA as qualified to perform high complexity clinical laboratory testing.    PIP/TAZO Value in next row Sensitive      <=4 SENSITIVEThis is a modified FDA-approved test that has been validated and its performance characteristics determined by the reporting laboratory.  This laboratory is certified under the Clinical Laboratory Improvement Amendments CLIA as qualified to perform high complexity clinical laboratory testing.    MEROPENEM Value in next row Sensitive      <=4 SENSITIVEThis is a modified FDA-approved test that has been validated and its performance characteristics determined by the reporting laboratory.  This laboratory is certified under the Clinical Laboratory Improvement Amendments CLIA as qualified to perform high complexity clinical laboratory testing.    * >=100,000 COLONIES/mL ESCHERICHIA COLI  Blood culture (routine x 2)     Status: Abnormal (Preliminary result)   Collection Time: 02/22/24  6:57 PM   Specimen: BLOOD  Result Value Ref Range Status   Specimen Description   Final    BLOOD LEFT  ANTECUBITAL Performed at [facility], [address] [place], [place] [address]    Special Requests   Final    BOTTLES DRAWN AEROBIC AND ANAEROBIC Blood Culture adequate volume Performed at [facility], [address] [place], [place] [address]    Culture  Setup Time   Final    ANAEROBIC BOTTLE ONLY IN BOTH AEROBIC AND ANAEROBIC BOTTLES Gram Stain Report Called to,Read Back By and Verified With: J THOMPSON AT 0755 ON 11.20.25 BY ADGER J Organism ID to follow Performed at [facility], [address], [place], [place] [address]    Culture ESCHERICHIA COLI (A)  Final   Report Status PENDING  Incomplete   Organism ID, Bacteria ESCHERICHIA COLI  Final      Susceptibility   Escherichia coli - MIC*    AMPICILLIN >=32 RESISTANT Resistant     CEFAZOLIN  (NON-URINE) 4 INTERMEDIATE Intermediate     CEFEPIME <=0.12 SENSITIVE Sensitive     ERTAPENEM <=0.12 SENSITIVE Sensitive     CEFTRIAXONE  <=0.25 SENSITIVE Sensitive     CIPROFLOXACIN  <=0.06 SENSITIVE Sensitive     GENTAMICIN <=1 SENSITIVE Sensitive  MEROPENEM <=0.25 SENSITIVE Sensitive     TRIMETH/SULFA >=320 RESISTANT Resistant     AMPICILLIN/SULBACTAM 16 INTERMEDIATE Intermediate     PIP/TAZO Value in next row Sensitive      <=4 SENSITIVEThis is a modified FDA-approved test that has been validated and its performance characteristics determined by the reporting laboratory.  This laboratory is certified under the Clinical Laboratory Improvement Amendments CLIA as qualified to perform high complexity clinical laboratory testing.    * ESCHERICHIA COLI  Blood Culture ID Panel (Reflexed)     Status: Abnormal   Collection Time: 02/22/24  6:57 PM  Result Value Ref Range Status   Enterococcus faecalis NOT DETECTED NOT DETECTED Final   Enterococcus Faecium NOT DETECTED NOT DETECTED Final   Listeria monocytogenes NOT DETECTED NOT DETECTED Final   Staphylococcus species NOT DETECTED NOT DETECTED Final   Staphylococcus aureus (BCID) NOT  DETECTED NOT DETECTED Final   Staphylococcus epidermidis NOT DETECTED NOT DETECTED Final   Staphylococcus lugdunensis NOT DETECTED NOT DETECTED Final   Streptococcus species NOT DETECTED NOT DETECTED Final   Streptococcus agalactiae NOT DETECTED NOT DETECTED Final   Streptococcus pneumoniae NOT DETECTED NOT DETECTED Final   Streptococcus pyogenes NOT DETECTED NOT DETECTED Final   A.calcoaceticus-baumannii NOT DETECTED NOT DETECTED Final   Bacteroides fragilis NOT DETECTED NOT DETECTED Final   Enterobacterales DETECTED (A) NOT DETECTED Final    Comment: Enterobacterales represent a large order of gram negative bacteria, not a single organism. CRITICAL RESULT CALLED TO, READ BACK BY AND VERIFIED WITHBETHA CANDIE SOUR PHARMD, AT 1437 02/23/24 D. VANHOOK    Enterobacter cloacae complex NOT DETECTED NOT DETECTED Final   Escherichia coli DETECTED (A) NOT DETECTED Final    Comment: CRITICAL RESULT CALLED TO, READ BACK BY AND VERIFIED WITHBETHA CANDIE SOUR PHARMD, AT 1437 02/23/24 D. VANHOOK    Klebsiella aerogenes NOT DETECTED NOT DETECTED Final   Klebsiella oxytoca NOT DETECTED NOT DETECTED Final   Klebsiella pneumoniae NOT DETECTED NOT DETECTED Final   Proteus species NOT DETECTED NOT DETECTED Final   Salmonella species NOT DETECTED NOT DETECTED Final   Serratia marcescens NOT DETECTED NOT DETECTED Final   Haemophilus influenzae NOT DETECTED NOT DETECTED Final   Neisseria meningitidis NOT DETECTED NOT DETECTED Final   Pseudomonas aeruginosa NOT DETECTED NOT DETECTED Final   Stenotrophomonas maltophilia NOT DETECTED NOT DETECTED Final   Candida albicans NOT DETECTED NOT DETECTED Final   Candida auris NOT DETECTED NOT DETECTED Final   Candida glabrata NOT DETECTED NOT DETECTED Final   Candida krusei NOT DETECTED NOT DETECTED Final   Candida parapsilosis NOT DETECTED NOT DETECTED Final   Candida tropicalis NOT DETECTED NOT DETECTED Final   Cryptococcus neoformans/gattii NOT DETECTED NOT DETECTED Final    CTX-M ESBL NOT DETECTED NOT DETECTED Final   Carbapenem resistance IMP NOT DETECTED NOT DETECTED Final   Carbapenem resistance KPC NOT DETECTED NOT DETECTED Final   Carbapenem resistance NDM NOT DETECTED NOT DETECTED Final   Carbapenem resist OXA 48 LIKE NOT DETECTED NOT DETECTED Final   Carbapenem resistance VIM NOT DETECTED NOT DETECTED Final    Comment: Performed at [facility], [address] [place], [place] [address]  Blood culture (routine x 2)     Status: Abnormal (Preliminary result)   Collection Time: 02/22/24  8:11 PM   Specimen: BLOOD  Result Value Ref Range Status   Specimen Description   Final    BLOOD BLOOD LEFT HAND Performed at [facility], [address], [place], [place]  [address]    Special Requests   Final    BOTTLES DRAWN AEROBIC AND ANAEROBIC Blood Culture adequate volume Performed at [facility], [address], [place], [place] [address]    Culture  Setup Time   Final    ANAEROBIC BOTTLE ONLY IN BOTH AEROBIC AND ANAEROBIC BOTTLES Gram Stain Report Called to,Read Back By and Verified With: J THOMPSON AT 0855 ON 11.02.25 BY ADGER J Performed at [facility], [address] [place], [place] [address]    Culture (A)  Final    ESCHERICHIA COLI SUSCEPTIBILITIES PERFORMED ON PREVIOUS CULTURE WITHIN THE LAST 5 DAYS. Performed at [facility], [address] [place], [place] [address]    Report Status PENDING  Incomplete  Respiratory (~20 pathogens) panel by PCR     Status: None   Collection Time: 02/22/24  8:51 PM   Specimen: Nasopharyngeal Swab; Respiratory  Result Value Ref Range Status   Adenovirus NOT DETECTED NOT DETECTED Final   Coronavirus 229E NOT DETECTED NOT DETECTED Final    Comment: (NOTE) The Coronavirus on the Respiratory Panel, DOES NOT test for the novel  Coronavirus (2019 nCoV)    Coronavirus HKU1 NOT DETECTED NOT DETECTED Final   Coronavirus NL63 NOT DETECTED NOT DETECTED Final   Coronavirus OC43 NOT DETECTED NOT DETECTED  Final   Metapneumovirus NOT DETECTED NOT DETECTED Final   Rhinovirus / Enterovirus NOT DETECTED NOT DETECTED Final   Influenza A NOT DETECTED NOT DETECTED Final   Influenza B NOT DETECTED NOT DETECTED Final   Parainfluenza Virus 1 NOT DETECTED NOT DETECTED Final   Parainfluenza Virus 2 NOT DETECTED NOT DETECTED Final   Parainfluenza Virus 3 NOT DETECTED NOT DETECTED Final   Parainfluenza Virus 4 NOT DETECTED NOT DETECTED Final   Respiratory Syncytial Virus NOT DETECTED NOT DETECTED Final   Bordetella pertussis NOT DETECTED NOT DETECTED Final   Bordetella Parapertussis NOT DETECTED NOT DETECTED Final   Chlamydophila pneumoniae NOT DETECTED NOT DETECTED Final   Mycoplasma pneumoniae NOT DETECTED NOT DETECTED Final    Comment: Performed at [facility], [address], [place], [place] [address]     Labs: BNP (last 3 results) Recent Labs    02/28/23 0909 09/05/23 1046 12/05/23 1422  BNP 59.0 171.0* 151.0*   Basic Metabolic Panel: Recent Labs  Lab 02/22/24 1250 02/22/24 2011 02/23/24 0437 02/24/24 0444 02/25/24 0415  NA 138  --  141 140 142  K 4.2  --  4.1 3.9 4.0  CL 105  --  109 109 109  CO2 21*  --  21* 22 24  GLUCOSE 127*  --  86 109* 80  BUN 27*  --  24* 29* 19  CREATININE 1.65*  --  1.49* 1.48* 1.29*  CALCIUM  9.0  --  8.5* 8.7* 8.9  MG  --  1.9  --  2.0 2.1  PHOS  --  3.3  --   --   --    Liver Function Tests: Recent Labs  Lab 02/22/24 1250  AST 39  ALT 18  ALKPHOS 71  BILITOT 0.6  PROT 6.6  ALBUMIN  3.6   Recent Labs  Lab 02/22/24 1250  LIPASE 56*   No results for input(s): AMMONIA in the last 168 hours. CBC: Recent Labs  Lab 02/22/24 1250 02/23/24 0437 02/24/24 0444 02/25/24 0415  WBC 8.4 11.8* 8.8 6.2  NEUTROABS 7.3  --   --   --   HGB 10.3* 9.5* 9.1* 10.1*  HCT 33.2*  30.9* 28.7* 31.8*  MCV 102.5* 102.7* 100.0 100.3*  PLT 81* 71* 75* 79*   Cardiac Enzymes: No results for input(s): CKTOTAL, CKMB, CKMBINDEX, TROPONINI in  the last 168 hours. BNP: Invalid input(s): POCBNP CBG: No results for input(s): GLUCAP in the last 168 hours. D-Dimer No results for input(s): DDIMER in the last 72 hours. Hgb A1c No results for input(s): HGBA1C in the last 72 hours. Lipid Profile No results for input(s): CHOL, HDL, LDLCALC, TRIG, CHOLHDL, LDLDIRECT in the last 72 hours. Thyroid  function studies No results for input(s): TSH, T4TOTAL, T3FREE, THYROIDAB in the last 72 hours.  Invalid input(s): FREET3 Anemia work up Recent Labs    02/22/24 2011  VITAMINB12 738  FOLATE 17.7   Urinalysis    Component Value Date/Time   COLORURINE YELLOW 02/22/2024 1618   APPEARANCEUR HAZY (A) 02/22/2024 1618   LABSPEC 1.005 02/22/2024 1618   PHURINE 6.0 02/22/2024 1618   GLUCOSEU NEGATIVE 02/22/2024 1618   HGBUR SMALL (A) 02/22/2024 1618   BILIRUBINUR NEGATIVE 02/22/2024 1618   KETONESUR NEGATIVE 02/22/2024 1618   PROTEINUR NEGATIVE 02/22/2024 1618   NITRITE NEGATIVE 02/22/2024 1618   LEUKOCYTESUR MODERATE (A) 02/22/2024 1618   Sepsis Labs Recent Labs  Lab 02/22/24 1250 02/23/24 0437 02/24/24 0444 02/25/24 0415  WBC 8.4 11.8* 8.8 6.2   Microbiology Recent Results (from the past 240 hours)  Resp panel by RT-PCR (RSV, Flu A&B, Covid) Anterior Nasal Swab     Status: None   Collection Time: 02/22/24  3:30 PM   Specimen: Anterior Nasal Swab  Result Value Ref Range Status   SARS Coronavirus 2 by RT PCR NEGATIVE NEGATIVE Final    Comment: (NOTE) SARS-CoV-2 target nucleic acids are NOT DETECTED.  The SARS-CoV-2 RNA is generally detectable in upper respiratory specimens during the acute phase of infection. The lowest concentration of SARS-CoV-2 viral copies this assay can detect is 138 copies/mL. A negative result does not preclude SARS-Cov-2 infection and should not be used as the sole basis for treatment or other patient management decisions. A negative result may occur with  improper  specimen collection/handling, submission of specimen other than nasopharyngeal swab, presence of viral mutation(s) within the areas targeted by this assay, and inadequate number of viral copies(<138 copies/mL). A negative result must be combined with clinical observations, patient history, and epidemiological information. The expected result is Negative.  Fact Sheet for Patients:  bloggercourse.com  Fact Sheet for Healthcare Providers:  seriousbroker.it  This test is no t yet approved or cleared by the [place]  FDA and  has been authorized for detection and/or diagnosis of SARS-CoV-2 by FDA under an Emergency Use Authorization (EUA). This EUA will remain  in effect (meaning this test can be used) for the duration of the COVID-19 declaration under Section 564(b)(1) of the Act, 21 U.S.C.section 360bbb-3(b)(1), unless the authorization is terminated  or revoked sooner.       Influenza A by PCR NEGATIVE NEGATIVE Final   Influenza B by PCR NEGATIVE NEGATIVE Final    Comment: (NOTE) The Xpert Xpress SARS-CoV-2/FLU/RSV plus assay is intended as an aid in the diagnosis of influenza from Nasopharyngeal swab specimens and should not be used as a sole basis for treatment. Nasal washings and aspirates are unacceptable for Xpert Xpress SARS-CoV-2/FLU/RSV testing.  Fact Sheet for Patients: bloggercourse.com  Fact Sheet for Healthcare Providers: seriousbroker.it  This test is not yet approved or cleared by the [place]  FDA and has been authorized for detection and/or diagnosis of SARS-CoV-2 by FDA  under an Emergency Use Authorization (EUA). This EUA will remain in effect (meaning this test can be used) for the duration of the COVID-19 declaration under Section 564(b)(1) of the Act, 21 U.S.C. section 360bbb-3(b)(1), unless the authorization is terminated or revoked.     Resp  Syncytial Virus by PCR NEGATIVE NEGATIVE Final    Comment: (NOTE) Fact Sheet for Patients: bloggercourse.com  Fact Sheet for Healthcare Providers: seriousbroker.it  This test is not yet approved or cleared by the [place]  FDA and has been authorized for detection and/or diagnosis of SARS-CoV-2 by FDA under an Emergency Use Authorization (EUA). This EUA will remain in effect (meaning this test can be used) for the duration of the COVID-19 declaration under Section 564(b)(1) of the Act, 21 U.S.C. section 360bbb-3(b)(1), unless the authorization is terminated or revoked.  Performed at [facility], [address] [place], [place] [address]   Urine Culture     Status: Abnormal   Collection Time: 02/22/24  4:18 PM   Specimen: Urine, Clean Catch  Result Value Ref Range Status   Specimen Description   Final    URINE, CLEAN CATCH Performed at [facility], [address] [place], [place] [address]    Special Requests   Final    NONE Performed at [facility], [address] [place], [place] [address]    Culture >=100,000 COLONIES/mL ESCHERICHIA COLI (A)  Final   Report Status 02/25/2024 FINAL  Final   Organism ID, Bacteria ESCHERICHIA COLI (A)  Final      Susceptibility   Escherichia coli - MIC*    AMPICILLIN >=32 RESISTANT Resistant     CEFAZOLIN  (URINE) Value in next row Sensitive      2 SENSITIVEThis is a modified FDA-approved test that has been validated and its performance characteristics determined by the reporting laboratory.  This laboratory is certified under the Clinical Laboratory Improvement Amendments CLIA as qualified to perform high complexity clinical laboratory testing.    CEFEPIME Value in next row Sensitive      2 SENSITIVEThis is a modified FDA-approved test that has been validated and its performance characteristics determined by the reporting laboratory.  This laboratory is certified under the Clinical  Laboratory Improvement Amendments CLIA as qualified to perform high complexity clinical laboratory testing.    ERTAPENEM Value in next row Sensitive      2 SENSITIVEThis is a modified FDA-approved test that has been validated and its performance characteristics determined by the reporting laboratory.  This laboratory is certified under the Clinical Laboratory Improvement Amendments CLIA as qualified to perform high complexity clinical laboratory testing.    CEFTRIAXONE  Value in next row Sensitive      2 SENSITIVEThis is a modified FDA-approved test that has been validated and its performance characteristics determined by the reporting laboratory.  This laboratory is certified under the Clinical Laboratory Improvement Amendments CLIA as qualified to perform high complexity clinical laboratory testing.    CIPROFLOXACIN  Value in next row Sensitive      2 SENSITIVEThis is a modified FDA-approved test that has been validated and its performance characteristics determined by the reporting laboratory.  This laboratory is certified under the Clinical Laboratory Improvement Amendments CLIA as qualified to perform high complexity clinical laboratory testing.    GENTAMICIN Value in next row Sensitive      2 SENSITIVEThis is a modified FDA-approved test that has been validated and its performance characteristics determined by the reporting laboratory.  This laboratory is certified under the Clinical Laboratory Improvement Amendments  CLIA as qualified to perform high complexity clinical laboratory testing.    NITROFURANTOIN Value in next row Sensitive      2 SENSITIVEThis is a modified FDA-approved test that has been validated and its performance characteristics determined by the reporting laboratory.  This laboratory is certified under the Clinical Laboratory Improvement Amendments CLIA as qualified to perform high complexity clinical laboratory testing.    TRIMETH/SULFA Value in next row Resistant      2  SENSITIVEThis is a modified FDA-approved test that has been validated and its performance characteristics determined by the reporting laboratory.  This laboratory is certified under the Clinical Laboratory Improvement Amendments CLIA as qualified to perform high complexity clinical laboratory testing.    AMPICILLIN/SULBACTAM Value in next row Intermediate      2 SENSITIVEThis is a modified FDA-approved test that has been validated and its performance characteristics determined by the reporting laboratory.  This laboratory is certified under the Clinical Laboratory Improvement Amendments CLIA as qualified to perform high complexity clinical laboratory testing.    PIP/TAZO Value in next row Sensitive      <=4 SENSITIVEThis is a modified FDA-approved test that has been validated and its performance characteristics determined by the reporting laboratory.  This laboratory is certified under the Clinical Laboratory Improvement Amendments CLIA as qualified to perform high complexity clinical laboratory testing.    MEROPENEM Value in next row Sensitive      <=4 SENSITIVEThis is a modified FDA-approved test that has been validated and its performance characteristics determined by the reporting laboratory.  This laboratory is certified under the Clinical Laboratory Improvement Amendments CLIA as qualified to perform high complexity clinical laboratory testing.    * >=100,000 COLONIES/mL ESCHERICHIA COLI  Blood culture (routine x 2)     Status: Abnormal (Preliminary result)   Collection Time: 02/22/24  6:57 PM   Specimen: BLOOD  Result Value Ref Range Status   Specimen Description   Final    BLOOD LEFT ANTECUBITAL Performed at [facility], [address], [place], [place] [address]    Special Requests   Final    BOTTLES DRAWN AEROBIC AND ANAEROBIC Blood Culture adequate volume Performed at [facility], [address] [place], [place] [address]    Culture  Setup Time   Final    ANAEROBIC BOTTLE ONLY IN  BOTH AEROBIC AND ANAEROBIC BOTTLES Gram Stain Report Called to,Read Back By and Verified With: J THOMPSON AT 0755 ON 11.20.25 BY ADGER J Organism ID to follow Performed at [facility], [address] [place], [place] [address]    Culture ESCHERICHIA COLI (A)  Final   Report Status PENDING  Incomplete   Organism ID, Bacteria ESCHERICHIA COLI  Final      Susceptibility   Escherichia coli - MIC*    AMPICILLIN >=32 RESISTANT Resistant     CEFAZOLIN  (NON-URINE) 4 INTERMEDIATE Intermediate     CEFEPIME <=0.12 SENSITIVE Sensitive     ERTAPENEM <=0.12 SENSITIVE Sensitive     CEFTRIAXONE  <=0.25 SENSITIVE Sensitive     CIPROFLOXACIN  <=0.06 SENSITIVE Sensitive     GENTAMICIN <=1 SENSITIVE Sensitive     MEROPENEM <=0.25 SENSITIVE Sensitive     TRIMETH/SULFA >=320 RESISTANT Resistant     AMPICILLIN/SULBACTAM 16 INTERMEDIATE Intermediate     PIP/TAZO Value in next row Sensitive      <=4 SENSITIVEThis is a modified FDA-approved test that has been validated and its performance characteristics determined by the reporting laboratory.  This laboratory is certified under the Clinical  Laboratory Improvement Amendments CLIA as qualified to perform high complexity clinical laboratory testing.    * ESCHERICHIA COLI  Blood Culture ID Panel (Reflexed)     Status: Abnormal   Collection Time: 02/22/24  6:57 PM  Result Value Ref Range Status   Enterococcus faecalis NOT DETECTED NOT DETECTED Final   Enterococcus Faecium NOT DETECTED NOT DETECTED Final   Listeria monocytogenes NOT DETECTED NOT DETECTED Final   Staphylococcus species NOT DETECTED NOT DETECTED Final   Staphylococcus aureus (BCID) NOT DETECTED NOT DETECTED Final   Staphylococcus epidermidis NOT DETECTED NOT DETECTED Final   Staphylococcus lugdunensis NOT DETECTED NOT DETECTED Final   Streptococcus species NOT DETECTED NOT DETECTED Final   Streptococcus agalactiae NOT DETECTED NOT DETECTED Final   Streptococcus pneumoniae NOT DETECTED NOT  DETECTED Final   Streptococcus pyogenes NOT DETECTED NOT DETECTED Final   A.calcoaceticus-baumannii NOT DETECTED NOT DETECTED Final   Bacteroides fragilis NOT DETECTED NOT DETECTED Final   Enterobacterales DETECTED (A) NOT DETECTED Final    Comment: Enterobacterales represent a large order of gram negative bacteria, not a single organism. CRITICAL RESULT CALLED TO, READ BACK BY AND VERIFIED WITHBETHA CANDIE SOUR PHARMD, AT 1437 02/23/24 D. VANHOOK    Enterobacter cloacae complex NOT DETECTED NOT DETECTED Final   Escherichia coli DETECTED (A) NOT DETECTED Final    Comment: CRITICAL RESULT CALLED TO, READ BACK BY AND VERIFIED WITHBETHA CANDIE SOUR PHARMD, AT 1437 02/23/24 D. VANHOOK    Klebsiella aerogenes NOT DETECTED NOT DETECTED Final   Klebsiella oxytoca NOT DETECTED NOT DETECTED Final   Klebsiella pneumoniae NOT DETECTED NOT DETECTED Final   Proteus species NOT DETECTED NOT DETECTED Final   Salmonella species NOT DETECTED NOT DETECTED Final   Serratia marcescens NOT DETECTED NOT DETECTED Final   Haemophilus influenzae NOT DETECTED NOT DETECTED Final   Neisseria meningitidis NOT DETECTED NOT DETECTED Final   Pseudomonas aeruginosa NOT DETECTED NOT DETECTED Final   Stenotrophomonas maltophilia NOT DETECTED NOT DETECTED Final   Candida albicans NOT DETECTED NOT DETECTED Final   Candida auris NOT DETECTED NOT DETECTED Final   Candida glabrata NOT DETECTED NOT DETECTED Final   Candida krusei NOT DETECTED NOT DETECTED Final   Candida parapsilosis NOT DETECTED NOT DETECTED Final   Candida tropicalis NOT DETECTED NOT DETECTED Final   Cryptococcus neoformans/gattii NOT DETECTED NOT DETECTED Final   CTX-M ESBL NOT DETECTED NOT DETECTED Final   Carbapenem resistance IMP NOT DETECTED NOT DETECTED Final   Carbapenem resistance KPC NOT DETECTED NOT DETECTED Final   Carbapenem resistance NDM NOT DETECTED NOT DETECTED Final   Carbapenem resist OXA 48 LIKE NOT DETECTED NOT DETECTED Final   Carbapenem  resistance VIM NOT DETECTED NOT DETECTED Final    Comment: Performed at [facility], [address] [place], [place] [address]  Blood culture (routine x 2)     Status: Abnormal (Preliminary result)   Collection Time: 02/22/24  8:11 PM   Specimen: BLOOD  Result Value Ref Range Status   Specimen Description   Final    BLOOD BLOOD LEFT HAND Performed at [facility], [address] [place], [place] [address]    Special Requests   Final    BOTTLES DRAWN AEROBIC AND ANAEROBIC Blood Culture adequate volume Performed at [facility], [address], [place], [place] [address]    Culture  Setup Time   Final    ANAEROBIC BOTTLE ONLY IN BOTH AEROBIC AND ANAEROBIC BOTTLES Gram Stain Report Called to,Read Back By and Verified  With: JINNY HUMMER AT 9144 ON 11.02.25 BY ADGER J Performed at [facility], [address], [place], [place] [address]    Culture (A)  Final    ESCHERICHIA COLI SUSCEPTIBILITIES PERFORMED ON PREVIOUS CULTURE WITHIN THE LAST 5 DAYS. Performed at [facility], [address], [place], [place] [address]    Report Status PENDING  Incomplete  Respiratory (~20 pathogens) panel by PCR     Status: None   Collection Time: 02/22/24  8:51 PM   Specimen: Nasopharyngeal Swab; Respiratory  Result Value Ref Range Status   Adenovirus NOT DETECTED NOT DETECTED Final   Coronavirus 229E NOT DETECTED NOT DETECTED Final    Comment: (NOTE) The Coronavirus on the Respiratory Panel, DOES NOT test for the novel  Coronavirus (2019 nCoV)    Coronavirus HKU1 NOT DETECTED NOT DETECTED Final   Coronavirus NL63 NOT DETECTED NOT DETECTED Final   Coronavirus OC43 NOT DETECTED NOT DETECTED Final   Metapneumovirus NOT DETECTED NOT DETECTED Final   Rhinovirus / Enterovirus NOT DETECTED NOT DETECTED Final   Influenza A NOT DETECTED NOT DETECTED Final   Influenza B NOT DETECTED NOT DETECTED Final   Parainfluenza Virus 1 NOT DETECTED NOT DETECTED Final   Parainfluenza Virus 2 NOT DETECTED  NOT DETECTED Final   Parainfluenza Virus 3 NOT DETECTED NOT DETECTED Final   Parainfluenza Virus 4 NOT DETECTED NOT DETECTED Final   Respiratory Syncytial Virus NOT DETECTED NOT DETECTED Final   Bordetella pertussis NOT DETECTED NOT DETECTED Final   Bordetella Parapertussis NOT DETECTED NOT DETECTED Final   Chlamydophila pneumoniae NOT DETECTED NOT DETECTED Final   Mycoplasma pneumoniae NOT DETECTED NOT DETECTED Final    Comment: Performed at [facility], [address] [place], [place] [address]     Time coordinating discharge: 35 minutes  SIGNED:   Adron JONETTA Fairly, DO [facility] 02/25/2024, 9:44 AM  If 7PM-7AM, please contact night-coverage [email or web address]

## 2024-02-25 NOTE — Plan of Care (Signed)
  Problem: Education: Goal: Knowledge of General Education information will improve Description: Including pain rating scale, medication(s)/side effects and non-pharmacologic comfort measures 02/25/2024 1234 by Delores Kirsch, RN Outcome: Adequate for Discharge 02/25/2024 1233 by Delores Kirsch, RN Outcome: Progressing   Problem: Health Behavior/Discharge Planning: Goal: Ability to manage health-related needs will improve 02/25/2024 1234 by Delores Kirsch, RN Outcome: Adequate for Discharge 02/25/2024 1233 by Delores Kirsch, RN Outcome: Progressing   Problem: Clinical Measurements: Goal: Ability to maintain clinical measurements within normal limits will improve Outcome: Adequate for Discharge Goal: Will remain free from infection Outcome: Adequate for Discharge Goal: Diagnostic test results will improve Outcome: Adequate for Discharge Goal: Respiratory complications will improve Outcome: Adequate for Discharge Goal: Cardiovascular complication will be avoided Outcome: Adequate for Discharge   Problem: Activity: Goal: Risk for activity intolerance will decrease Outcome: Adequate for Discharge   Problem: Nutrition: Goal: Adequate nutrition will be maintained Outcome: Adequate for Discharge   Problem: Coping: Goal: Level of anxiety will decrease Outcome: Adequate for Discharge   Problem: Elimination: Goal: Will not experience complications related to bowel motility Outcome: Adequate for Discharge Goal: Will not experience complications related to urinary retention Outcome: Adequate for Discharge   Problem: Pain Managment: Goal: General experience of comfort will improve and/or be controlled Outcome: Adequate for Discharge   Problem: Safety: Goal: Ability to remain free from injury will improve Outcome: Adequate for Discharge   Problem: Skin Integrity: Goal: Risk for impaired skin integrity will decrease Outcome: Adequate for Discharge

## 2024-02-27 ENCOUNTER — Telehealth: Payer: Self-pay

## 2024-02-27 NOTE — Patient Instructions (Signed)
 Visit Information  Thank you for taking time to visit with me today. Please don't hesitate to contact me if I can be of assistance.  Drink plenty of water  or other fluids. Take antibiotics exactly as your healthcare provider tells you. Monitor for fever, chills, increased urination, burning on urination, blood in urine, nausea, vomiting, or pain in abdomen    Patient verbalizes understanding of instructions and care plan provided today and agrees to view in MyChart. Active MyChart status and patient understanding of how to access instructions and care plan via MyChart confirmed with patient.     The patient has been provided with contact information for the care management team and has been advised to call with any health related questions or concerns.   Please call the care guide team at [phone] if you need to cancel or reschedule your appointment.   Please call the Suicide and Crisis Lifeline: 988 if you are experiencing a Mental Health or Behavioral Health Crisis or need someone to talk to.  Lollie Gunner J. Jenan Ellegood RN, MSN [facility], [facility] RN Care Manager Direct Dial: [phone]  Fax: [phone] Website: [email or web address]

## 2024-02-27 NOTE — Transitions of Care (Post Inpatient/ED Visit) (Signed)
 02/27/2024  Name: Patricia Jackson MRN: [id number] DOB: March 27, 1941  Today's TOC FU Call Status: Today's TOC FU Call Status:: Successful TOC FU Call Completed TOC FU Call Complete Date: 02/27/24  Patient's Name and Date of Birth confirmed. Name, DOB  Transition Care Management Follow-up Telephone Call Date of Discharge: 02/25/24 [facility] Type of Discharge: Inpatient Admission Primary Inpatient Discharge Diagnosis:: Sepsis How have you been since you were released from the hospital?: Better Any questions or concerns?: No  Items Reviewed: Did you receive and understand the discharge instructions provided?: Yes Medications obtained,verified, and reconciled?: Partial Review Completed Reason for Partial Mediation Review: Patient states she has her cipro  but not familiar with other medications as she gets pilll packs. Any new allergies since your discharge?: No Dietary orders reviewed?: Yes Type of Diet Ordered:: Heart Healthy Do you have support at home?: Yes People in Home [RPT]: child(ren), adult, sibling(s) Name of Support/Comfort Primary Source: Son and daughter in law and sister  Medications Reviewed Today: Medications Reviewed Today     Reviewed by Kyon Bentler, RN (Case Manager) on 02/27/24 at 1039  Med List Status: <None>   Medication Order Taking? Sig Documenting Provider Last Dose Status Informant  acetaminophen  (TYLENOL ) 500 MG tablet [id number]  Take 1,000 mg by mouth every 8 (eight) hours as needed for mild pain or moderate pain. [provider]  Active Self, Pharmacy Records  albuterol  (PROVENTIL ) (2.5 MG/3ML) 0.083% nebulizer solution [id number]  Take 2.5 mg by nebulization every 6 (six) hours as needed for shortness of breath or wheezing. [provider]  Active Self, Pharmacy Records  albuterol  (VENTOLIN  HFA) [phone] Base) MCG/ACT inhaler [id number]  Inhale 2 puffs into the lungs every 4 (four) hours as needed for wheezing  or shortness of breath. [provider]  Active Self, Pharmacy Records  amiodarone  (PACERONE ) 200 MG tablet [id number]  Take 1 tablet (200 mg total) by mouth daily. Take 1 Tablet ( 200 mg ) Daily and none on Sunday Vicci Afton CROME, MD  Active Self, Pharmacy Records  amLODipine  (NORVASC ) 5 MG tablet [id number]  Take 5 mg by mouth daily. [provider]  Active Self, Pharmacy Records  apixaban  (ELIQUIS ) 2.5 MG TABS tablet [id number]  Take 1 tablet (2.5 mg total) by mouth 2 (two) times daily. Alvan Dorn FALCON, MD  Active Self, Pharmacy Records  ciprofloxacin  (CIPRO ) 500 MG tablet [id number] Yes Take 1 tablet (500 mg total) by mouth 2 (two) times daily for 4 days. Maree Bracken D, DO  Active   ferrous sulfate  324 MG TBEC [id number]  Take 324 mg by mouth daily with breakfast. [provider]  Active Self, Pharmacy Records  levothyroxine  (SYNTHROID ) 75 MCG tablet [id number]  Take 75 mcg by mouth daily before breakfast. [provider]  Active Self, Pharmacy Records  losartan (COZAAR) 25 MG tablet [id number]  Take 25 mg by mouth at bedtime. [provider]  Active Self, Pharmacy Records  lubiprostone  (AMITIZA ) 24 MCG capsule [id number]  Take 1 capsule (24 mcg total) by mouth 2 (two) times daily with a meal. Shirlean Therisa ORN, NP  Active Self, Pharmacy Records  nitroGLYCERIN  (NITROSTAT ) 0.4 MG SL tablet [id number]  Place 1 tablet (0.4 mg total) under the tongue every 5 (five) minutes as needed for chest pain. Vicci Afton CROME, MD  Active Self, Pharmacy Records  ondansetron  (ZOFRAN -ODT) 4 MG disintegrating tablet [id number]  Take 4 mg by mouth 2 (two) times daily. [provider]  Active   pantoprazole  (  PROTONIX ) 40 MG tablet [id number]  Take 1 tablet (40 mg total) by mouth 2 (two) times daily. Maree, Pratik D, DO  Expired 02/22/24 2359 Self, Pharmacy Records  rosuvastatin  (CRESTOR ) 20 MG tablet [id number]  Take 20 mg by mouth daily. [provider]  Active  Self, Pharmacy Records  sertraline  (ZOLOFT ) 25 MG tablet [id number]  Take 25 mg by mouth daily. [provider]  Active   timolol  (TIMOPTIC ) 0.5 % ophthalmic solution [id number]  Place 1 drop into the right eye 2 (two) times daily. [provider]  Active Self, Pharmacy Records  traZODone  (DESYREL ) 50 MG tablet [id number]  Take 50 mg by mouth at bedtime. [provider]  Active Self, Pharmacy Records  [facility] ELLIPTA 200-62.5-25 MCG/ACT AEPB [id number]  Inhale 1 puff into the lungs daily. [provider]  Active Self, Pharmacy Records            Home Care and Equipment/Supplies: Were Home Health Services Ordered?: Yes Name of Home Health Agency:: Patient declined  Functional Questionnaire: Do you need assistance with bathing/showering or dressing?: No Do you need assistance with meal preparation?: No Do you need assistance with eating?: No Do you have difficulty maintaining continence: No Do you need assistance with getting out of bed/getting out of a chair/moving?: No Do you have difficulty managing or taking your medications?: Yes (uses pill packs)  Follow up appointments reviewed: PCP Follow-up appointment confirmed?: Yes Date of PCP follow-up appointment?:  (Patient reports she has an appointment but not sure of the date as her daughter in law keeps up with appointments.) Follow-up Provider: [name] Follow-up appointment confirmed?: NA Do you need transportation to your follow-up appointment?: No Do you understand care options if your condition(s) worsen?: Yes-patient verbalized understanding  SDOH Interventions Today    Flowsheet Row Most Recent Value  SDOH Interventions   Food Insecurity Interventions Intervention Not Indicated  Housing Interventions Intervention Not Indicated  Transportation Interventions Intervention Not Indicated  Utilities Interventions Intervention Not Indicated    Amaad Byers J. Keerstin Bjelland RN, MSN [facility]  Health  [facility], [facility] RN Care Manager Direct Dial: [phone]  Fax: [phone] Website: [email or web address]

## 2024-02-28 ENCOUNTER — Encounter: Payer: Self-pay | Admitting: Gastroenterology

## 2024-03-14 ENCOUNTER — Inpatient Hospital Stay: Attending: Oncology

## 2024-03-14 DIAGNOSIS — D500 Iron deficiency anemia secondary to blood loss (chronic): Secondary | ICD-10-CM | POA: Diagnosis not present

## 2024-03-14 DIAGNOSIS — Z9049 Acquired absence of other specified parts of digestive tract: Secondary | ICD-10-CM | POA: Diagnosis not present

## 2024-03-14 DIAGNOSIS — Z853 Personal history of malignant neoplasm of breast: Secondary | ICD-10-CM | POA: Diagnosis not present

## 2024-03-14 DIAGNOSIS — Z79899 Other long term (current) drug therapy: Secondary | ICD-10-CM | POA: Diagnosis not present

## 2024-03-14 DIAGNOSIS — I4891 Unspecified atrial fibrillation: Secondary | ICD-10-CM | POA: Insufficient documentation

## 2024-03-14 DIAGNOSIS — I2510 Atherosclerotic heart disease of native coronary artery without angina pectoris: Secondary | ICD-10-CM | POA: Insufficient documentation

## 2024-03-14 DIAGNOSIS — Z888 Allergy status to other drugs, medicaments and biological substances status: Secondary | ICD-10-CM | POA: Insufficient documentation

## 2024-03-14 DIAGNOSIS — R519 Headache, unspecified: Secondary | ICD-10-CM | POA: Insufficient documentation

## 2024-03-14 DIAGNOSIS — J449 Chronic obstructive pulmonary disease, unspecified: Secondary | ICD-10-CM | POA: Insufficient documentation

## 2024-03-14 DIAGNOSIS — R1110 Vomiting, unspecified: Secondary | ICD-10-CM | POA: Diagnosis not present

## 2024-03-14 DIAGNOSIS — R0602 Shortness of breath: Secondary | ICD-10-CM | POA: Insufficient documentation

## 2024-03-14 DIAGNOSIS — E610 Copper deficiency: Secondary | ICD-10-CM | POA: Insufficient documentation

## 2024-03-14 DIAGNOSIS — Z8249 Family history of ischemic heart disease and other diseases of the circulatory system: Secondary | ICD-10-CM | POA: Insufficient documentation

## 2024-03-14 DIAGNOSIS — Z87440 Personal history of urinary (tract) infections: Secondary | ICD-10-CM | POA: Diagnosis not present

## 2024-03-14 DIAGNOSIS — D696 Thrombocytopenia, unspecified: Secondary | ICD-10-CM | POA: Insufficient documentation

## 2024-03-14 DIAGNOSIS — I252 Old myocardial infarction: Secondary | ICD-10-CM | POA: Insufficient documentation

## 2024-03-14 DIAGNOSIS — N1831 Chronic kidney disease, stage 3a: Secondary | ICD-10-CM | POA: Insufficient documentation

## 2024-03-14 DIAGNOSIS — G479 Sleep disorder, unspecified: Secondary | ICD-10-CM | POA: Insufficient documentation

## 2024-03-14 DIAGNOSIS — Z7901 Long term (current) use of anticoagulants: Secondary | ICD-10-CM | POA: Insufficient documentation

## 2024-03-14 DIAGNOSIS — Z87891 Personal history of nicotine dependence: Secondary | ICD-10-CM | POA: Insufficient documentation

## 2024-03-14 DIAGNOSIS — H3320 Serous retinal detachment, unspecified eye: Secondary | ICD-10-CM | POA: Diagnosis not present

## 2024-03-14 DIAGNOSIS — F513 Sleepwalking [somnambulism]: Secondary | ICD-10-CM | POA: Insufficient documentation

## 2024-03-14 DIAGNOSIS — E559 Vitamin D deficiency, unspecified: Secondary | ICD-10-CM

## 2024-03-14 DIAGNOSIS — Z880 Allergy status to penicillin: Secondary | ICD-10-CM | POA: Insufficient documentation

## 2024-03-14 DIAGNOSIS — Z825 Family history of asthma and other chronic lower respiratory diseases: Secondary | ICD-10-CM | POA: Insufficient documentation

## 2024-03-14 DIAGNOSIS — Z809 Family history of malignant neoplasm, unspecified: Secondary | ICD-10-CM | POA: Insufficient documentation

## 2024-03-14 DIAGNOSIS — I129 Hypertensive chronic kidney disease with stage 1 through stage 4 chronic kidney disease, or unspecified chronic kidney disease: Secondary | ICD-10-CM | POA: Diagnosis not present

## 2024-03-14 DIAGNOSIS — Z882 Allergy status to sulfonamides status: Secondary | ICD-10-CM | POA: Insufficient documentation

## 2024-03-14 DIAGNOSIS — D509 Iron deficiency anemia, unspecified: Secondary | ICD-10-CM

## 2024-03-14 LAB — COMPREHENSIVE METABOLIC PANEL WITH GFR
ALT: 23 U/L (ref 0–44)
AST: 50 U/L — ABNORMAL HIGH (ref 15–41)
Albumin: 3.9 g/dL (ref 3.5–5.0)
Alkaline Phosphatase: 70 U/L (ref 38–126)
Anion gap: 12 (ref 5–15)
BUN: 23 mg/dL (ref 8–23)
CO2: 20 mmol/L — ABNORMAL LOW (ref 22–32)
Calcium: 9.2 mg/dL (ref 8.9–10.3)
Chloride: 109 mmol/L (ref 98–111)
Creatinine, Ser: 1.32 mg/dL — ABNORMAL HIGH (ref 0.44–1.00)
GFR, Estimated: 40 mL/min — ABNORMAL LOW (ref >60)
Glucose, Bld: 84 mg/dL (ref 70–99)
Potassium: 4.6 mmol/L (ref 3.5–5.1)
Sodium: 140 mmol/L (ref 135–145)
Total Bilirubin: 0.4 mg/dL (ref 0.0–1.2)
Total Protein: 7 g/dL (ref 6.5–8.1)

## 2024-03-14 LAB — CBC
HCT: 35.7 % — ABNORMAL LOW (ref 36.0–46.0)
Hemoglobin: 11.3 g/dL — ABNORMAL LOW (ref 12.0–15.0)
MCH: 32.4 pg (ref 26.0–34.0)
MCHC: 31.7 g/dL (ref 30.0–36.0)
MCV: 102.3 fL — ABNORMAL HIGH (ref 80.0–100.0)
Platelets: 102 K/uL — ABNORMAL LOW (ref 150–400)
RBC: 3.49 MIL/uL — ABNORMAL LOW (ref 3.87–5.11)
RDW: 17.5 % — ABNORMAL HIGH (ref 11.5–15.5)
WBC: 4.7 K/uL (ref 4.0–10.5)
nRBC: 0 % (ref 0.0–0.2)

## 2024-03-14 LAB — FOLATE: Folate: 14.7 ng/mL (ref >5.9)

## 2024-03-14 LAB — IRON AND TIBC
Iron: 83 ug/dL (ref 28–170)
Saturation Ratios: 31 % (ref 10.4–31.8)
TIBC: 266 ug/dL (ref 250–450)
UIBC: 183 ug/dL

## 2024-03-14 LAB — VITAMIN D 25 HYDROXY (VIT D DEFICIENCY, FRACTURES): Vit D, 25-Hydroxy: 61.82 ng/mL (ref 30–100)

## 2024-03-14 LAB — FERRITIN: Ferritin: 1181 ng/mL — ABNORMAL HIGH (ref 11–307)

## 2024-03-14 LAB — VITAMIN B12: Vitamin B-12: 767 pg/mL (ref 180–914)

## 2024-03-16 LAB — COPPER, SERUM: Copper: 93 ug/dL (ref 80–158)

## 2024-03-20 ENCOUNTER — Encounter: Payer: Self-pay | Admitting: Nurse Practitioner

## 2024-03-20 ENCOUNTER — Ambulatory Visit: Admitting: Nurse Practitioner

## 2024-03-20 VITALS — BP 148/70 | HR 48 | Ht 68.0 in | Wt 134.6 lb

## 2024-03-20 DIAGNOSIS — I4891 Unspecified atrial fibrillation: Secondary | ICD-10-CM

## 2024-03-20 NOTE — Progress Notes (Unsigned)
 Cardiology Office Note   Date: 11/25/2023 ID:  Patricia Jackson, Patricia Jackson 12-20-1940, MRN [id number] PCP: Shona Norleen PEDLAR, MD  [facility] Providers Cardiologist:  Alvan Carrier, MD Electrophysiologist:  Danelle Birmingham, MD     History of Present Illness Patricia Jackson is a 83 y.o. female with a PMH of CAD, A-fib/A-flutter, HTN, hyperlipidemia, CKD, anemia and hx of recurrent anemia d/t hx of AVM's, history of GI bleed, COPD, Raynaud's syndrome, carotid artery stenosis, and hx of breast cancer, who presents today for hospital follow-up.   Previous cardiovascular history of drug-eluting stent to left circumflex in 2017 in setting of NSTEMI.  History of DCCV in November 2024.  Last seen by Dr. Alvan on May 12, 2023. Was doing well at the time.   Hospitalized 11/2023 d/t back pain as well as presented with pain that radiated down both arms and denied any chest pain or shortness of breath, had elevated troponins.  Appears she had chronic troponin elevations.  Troponins have been in the 20s-30s since October 2024.  Outpatient echocardiogram arranged. Was r/o for ACS and d/c in stable condition.   Today she presents for hospital follow-up.  She states she gets dizzy/lightheaded when standing up, overall doing well. Denies any chest pain, shortness of breath, palpitations, syncope, presyncope, orthopnea, PND, swelling or significant weight changes, acute bleeding, or claudication. Son says she goes quickly when changing positions and encourages her often to drink more water . She is followed by a Hem/Onc specialist for her thrombocytopenia.    ____  Most recently, hospitalized with sepsis secondary to UTI, tx with IV ABX.   Today she is here for follow-up and for pre-op clearance. She is pending future dental extraction.      ROS: Negative. See HPI.   Studies Reviewed  EKG: EKG is not ordered today.   Echo 11/2023:   1. Left ventricular ejection fraction, by estimation, is 60 to  65%. The  left ventricle has normal function. The left ventricle has no regional  wall motion abnormalities. Left ventricular diastolic parameters are  consistent with Grade II diastolic  dysfunction (pseudonormalization). Elevated left atrial pressure.   2. Right ventricular systolic function is normal. The right ventricular  size is normal. There is normal pulmonary artery systolic pressure.   3. Left atrial size was moderately dilated.   4. The mitral valve is abnormal. Trivial mitral valve regurgitation. No  evidence of mitral stenosis. Moderate to severe mitral annular  calcification.   5. The aortic valve is tricuspid. Aortic valve regurgitation is not  visualized. No aortic stenosis is present.   6. The inferior vena cava is normal in size with greater than 50%  respiratory variability, suggesting right atrial pressure of 3 mmHg.   Comparison(s): No significant change from prior study.  Carotid duplex 02/2023:  IMPRESSION: Right:   Color duplex indicates moderate heterogeneous and calcified plaque, with no hemodynamically significant stenosis by duplex criteria in the extracranial cerebrovascular circulation.   Left:   Heterogeneous, partially calcified and irregular plaque at the left carotid bifurcation contributes to 50%-69% stenosis by established duplex criteria  LHC 03/2016:  The left ventricular ejection fraction is 55-65% by visual estimate. The left ventricular systolic function is normal. A STENT RESOLUTE ONYX 2.75X12 drug eluting stent was successfully placed, and does not overlap previously placed stent. Prox Cx to Mid Cx lesion, 99 %stenosed. Post intervention, there is a 0% residual stenosis.   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 Otherwise widely  patent coronary arteries Successful angioplasty and stenting of the proximal circumflex reducing a greater than 95% stenosis to 0% with TIMI grade 3 flow. Resolute Onyx  2.75 x 12 mm stent deployed at 14 atm.   Recommendations:   Aspirin , Brilinta , and Eliquis  x 1 month then drop aspirin . See further instruction below for 3 months and beyond. Eliquis  should start in AM as long as no recurrent AF/Afl. Would also consider switch to Plavix  at 3 months to decrease the risk of bleeding on Eliquis .   Physical Exam VS:  BP (!) 148/70   Pulse (!) 48   Ht 5' 8 (1.727 m)   Wt 134 lb 9.6 oz (61.1 kg)   SpO2 100%   BMI 20.47 kg/m        Wt Readings from Last 3 Encounters:  03/20/24 134 lb 9.6 oz (61.1 kg)  02/22/24 130 lb (59 kg)  01/27/24 130 lb (59 kg)    GEN: Well nourished, well developed in no acute distress NECK: No JVD; No carotid bruits CARDIAC: S1/S2, RRR, no murmurs, rubs, gallops RESPIRATORY:  Clear to auscultation without rales, wheezing or rhonchi  ABDOMEN: Soft, non-tender, non-distended EXTREMITIES:  No edema; No deformity   ASSESSMENT AND PLAN  CAD Stable with no anginal symptoms. No indication for ischemic evaluation. Continue current medication regimen. Heart healthy diet and regular cardiovascular exercise encouraged. Starting Zetia  10 mg daily and will obtain FLP/LFT in 1-2 months. Continue rest of medication regimen.   A-fib/A-flutter Denies any tachycardia or palpitations. HR is well controlled today. Continue current medication regimen and continue low dose Eliquis . Denies any bleeding issues. Continue to follow-up with EP.   HTN BP is stable. Discussed to monitor BP at home at least 2 hours after medications and sitting for 5-10 minutes. No medication changes at this time. Heart healthy diet and regular cardiovascular exercise encouraged.   Carotid artery stenosis, HLD, medication management Most recent LDL 73 from 3 weeks ago. LDL < 70. Carotid duplex 02/2023 revealed non hemodynamically significant stenosis along right and 50-69% stenosis at left carotid bifurcation.  Will begin Zetia  10 mg daily and repeat FLP/LFT in 1-2  months. Continue rest of medication regimen.   Orthostatic dizziness Most likely d/t dehydration and changing positions quickly. Discussed care precautions. Will work on lifestyle changes. If no improvement by next office, will obtain orthostatics. Continue to follow with PCP.  6. CKD stage IIIb/IV Most recent labs overall are stable. Avoid nephrotoxic agents. Follow-up with PCP and Nephrology.   7. Thrombocytopenia Denies any bleeding issues. Follow-up with Hem/Onc   Dispo: Care and ED precautions discussed. Follow-up with [facility] in 2-3 months or sooner if anything changes.   Signed, Almarie Crate, NP

## 2024-03-20 NOTE — Patient Instructions (Signed)
Medication Instructions:   Stop Amiodarone  Continue all other medications.     Labwork:  none  Testing/Procedures:  none  Follow-Up:  3 months   Any Other Special Instructions Will Be Listed Below (If Applicable).   If you need a refill on your cardiac medications before your next appointment, please call your pharmacy.'

## 2024-03-21 ENCOUNTER — Inpatient Hospital Stay: Admitting: Oncology

## 2024-03-21 VITALS — BP 90/50 | HR 55 | Temp 97.1°F | Resp 17 | Ht 68.0 in | Wt 131.0 lb

## 2024-03-21 DIAGNOSIS — R0602 Shortness of breath: Secondary | ICD-10-CM

## 2024-03-21 DIAGNOSIS — D500 Iron deficiency anemia secondary to blood loss (chronic): Secondary | ICD-10-CM | POA: Diagnosis not present

## 2024-03-21 DIAGNOSIS — D696 Thrombocytopenia, unspecified: Secondary | ICD-10-CM | POA: Diagnosis not present

## 2024-03-21 MED ORDER — FERROUS SULFATE 324 MG PO TBEC: 324.0000 mg | DELAYED_RELEASE_TABLET | ORAL | 2 refills | Status: AC

## 2024-03-21 NOTE — Assessment & Plan Note (Addendum)
 Onset of anemia noted on 05/19/2021, with Hgb 8.0/MCV 101.4.  Prior to this, she had Hgb 12.1 in July 2022.  Labs sent by PCP (05/11/2021): Hgb 9.2/MCV 96.  Creatinine was at baseline 1.38. Record review does show severe anemia in December 2017/January 2018 with Hgb 6.5, which was due to iron  deficiency of unknown source.  She received PRBC x2. 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  B12/methylmalonic acid, folate normal.  Normal SPEP/immunofixation.  DAT, haptoglobin, reticulocytes normal.  CMP showed baseline CKD stage IIIa/b She was Hemoccult negative in January 2018.  Most recent Hemoccult stool negative x2. She takes Eliquis  for atrial fibrillation She has been taking oral iron  supplements with only mild GI discomfort. She has occasional bright red blood in the toilet after straining for bowel movements.  No melena or epistaxis.  She has some chronic residual fatigue, but otherwise improved after IV iron  Reviewed most recent lab work which shows improvement of her iron  levels. No additional IV iron  needed at this time. Recommend reducing iron  tablets to every other day.  New prescription sent.

## 2024-03-21 NOTE — Progress Notes (Signed)
 [facility] [address] [place], [place] [address]   CLINIC:  Medical Oncology/Hematology  PCP:  Shona Norleen PEDLAR, MD [address] [place] [address] [phone]   REASON FOR VISIT:  Follow-up for anemia and thrombocytopenia  PRIOR THERAPY: None  CURRENT THERAPY: Under work-up  INTERVAL HISTORY:  Patricia Jackson 83 y.o. female returns for routine follow-up of her chronic thrombocytopenia and anemia.    Patient had received intermittent IV Feraheme  last given on 02/07/2024 and 02/14/2024 with good tolerance.  She denies any bright red blood per rectum, melena or hematochezia.  Patient has been hospitalized on several occasions since her last visit with us .  Most recently was admitted on 02/22/2024 and discharged on 02/25/2024 for sepsis due to UTI.  Prior to that, she was admitted and discharged on 01/28/2024 with pneumonia and pyelonephritis.  She is followed closely by cardiology (atrial fibrillation) and ophthalmology (macula off retinal detachment).  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  She is here to review her most recent lab work.  Reports overall doing fair since her last visit.  Reports today she feels slightly more short of breath than usual although she did run out of one of her important inhalers.  She needs to call her pulmonologist to get a refill of her Trelegy.  She has been using a rescue inhaler and it is not helping as much.  Reports appetite and energy levels are 50%.  Has an occasional headache and trouble sleeping.  Denies any coughing.  She continues to take Eliquis  for atrial fibrillation.  She is currently taking iron  supplements daily.   REVIEW OF SYSTEMS:    Review of Systems  Eyes:  Positive for eye problems.  Respiratory:  Positive for shortness of breath.   Gastrointestinal:  Positive for vomiting.  Neurological:  Positive for headaches.  Psychiatric/Behavioral:  Positive for sleep disturbance.       PAST  MEDICAL/SURGICAL HISTORY:  Past Medical History:  Diagnosis Date   A-fib [facility])    Atrial flutter (HCC)    On Eliquis  in 8/17 but discontinued after stent placement   Breast cancer (HCC)    remote   CAD in native artery 03/16/2016   COPD (chronic obstructive pulmonary disease) (HCC)    Dysrhythmia    Hypertension    Iron  deficiency anemia 06/12/2021   NSTEMI (non-ST elevated myocardial infarction) (HCC)    2017   S/P angioplasty with stent 03/15/16 DES Resolute, pLCX 03/16/2016   Vitreous hemorrhage of left eye (HCC) 08/01/2019   Past Surgical History:  Procedure Laterality Date   APPENDECTOMY     BREAST SURGERY Left    CARDIAC CATHETERIZATION N/A 03/15/2016   Procedure: Left Heart Cath and Coronary Angiography;  Surgeon: Victory LELON Sharps, MD;  Location: [facility] INVASIVE CV LAB;  Service: Cardiovascular;  Laterality: N/A;   CARDIAC CATHETERIZATION N/A 03/15/2016   Procedure: Coronary Stent Intervention;  Surgeon: Victory LELON Sharps, MD;  Location: [facility] INVASIVE CV LAB;  Service: Cardiovascular;  Laterality: N/A;   CARDIOVERSION N/A 02/04/2023   Procedure: CARDIOVERSION;  Surgeon: Stacia Diannah SQUIBB, MD;  Location: [facility] ORS;  Service: Cardiovascular;  Laterality: N/A;   COLONOSCOPY     remote   COLONOSCOPY N/A 05/26/2016   Procedure: COLONOSCOPY;  Surgeon: Lamar CHRISTELLA Hollingshead, MD;  Location: [facility] ENDO SUITE;  Service: Endoscopy;  Laterality: N/A;  845   COLONOSCOPY N/A 06/17/2023   Procedure: COLONOSCOPY;  Surgeon: Hollingshead Lamar CHRISTELLA, MD;  Location: [facility]  ENDO SUITE;  Service: Endoscopy;  Laterality: N/A;   ENTEROSCOPY N/A 09/06/2023   Procedure: ENTEROSCOPY;  Surgeon: Eartha Angelia Sieving, MD;  Location: [facility];  Service: Gastroenterology;  Laterality: N/A;   ESOPHAGOGASTRODUODENOSCOPY N/A 05/26/2016   Procedure: ESOPHAGOGASTRODUODENOSCOPY (EGD);  Surgeon: Lamar CHRISTELLA Hollingshead, MD;  Location: [facility];  Service: Endoscopy;  Laterality: N/A;   ESOPHAGOGASTRODUODENOSCOPY N/A 06/16/2023   Procedure: EGD  (ESOPHAGOGASTRODUODENOSCOPY);  Surgeon: Cinderella Deatrice FALCON, MD;  Location: [facility];  Service: Endoscopy;  Laterality: N/A;   ESOPHAGOGASTRODUODENOSCOPY N/A 09/06/2023   Procedure: EGD (ESOPHAGOGASTRODUODENOSCOPY);  Surgeon: Eartha Angelia, Sieving, MD;  Location: [facility];  Service: Gastroenterology;  Laterality: N/A;  with possible dilation   ESOPHAGOGASTRODUODENOSCOPY (EGD) WITH PROPOFOL  N/A 11/22/2022   Procedure: ESOPHAGOGASTRODUODENOSCOPY (EGD) WITH PROPOFOL ;  Surgeon: Cindie Carlin POUR, DO;  Location: [facility];  Service: Endoscopy;  Laterality: N/A;   EYE SURGERY     EYE SURGERY Left    Dr. Loretha   HEMOSTASIS CLIP PLACEMENT  06/17/2023   Procedure: CONTROL OF HEMORRHAGE, GI TRACT, ENDOSCOPIC, BY CLIPPING OR OVERSEWING;  Surgeon: Hollingshead Lamar CHRISTELLA, MD;  Location: [facility];  Service: Endoscopy;;   HOT HEMOSTASIS  11/22/2022   Procedure: HOT HEMOSTASIS (ARGON PLASMA COAGULATION/BICAP);  Surgeon: Cindie Carlin POUR, DO;  Location: [facility];  Service: Endoscopy;;   HOT HEMOSTASIS  06/16/2023   Procedure: EGD, WITH ARGON PLASMA COAGULATION;  Surgeon: Cinderella Deatrice FALCON, MD;  Location: [facility];  Service: Endoscopy;;   MALONEY DILATION N/A 05/26/2016   Procedure: AGAPITO DILATION;  Surgeon: Lamar CHRISTELLA Hollingshead, MD;  [facility]: [facility];  Service: Endoscopy;  Laterality: N/A;   POLYPECTOMY  06/17/2023   Procedure: POLYPECTOMY;  Surgeon: Hollingshead Lamar CHRISTELLA, MD;  Location: [facility];  Service: Endoscopy;;   SCLEROTHERAPY  06/17/2023   Procedure: MATIAS;  Surgeon: Hollingshead Lamar CHRISTELLA, MD;  Location: [facility];  Service: Endoscopy;;   TEE WITHOUT CARDIOVERSION N/A 02/04/2023   Procedure: TRANSESOPHAGEAL ECHOCARDIOGRAM (TEE);  Surgeon: Mallipeddi, Vishnu P, MD;  Location: [facility] ORS;  Service: Cardiovascular;  Laterality: N/A;   TOOTH EXTRACTION N/A 01/15/2022   Procedure: DENTAL RESTORATION/EXTRACTIONS;  Surgeon: Sheryle Hamilton, DMD;  Location: [facility] OR;  Service: Oral Surgery;   Laterality: N/A;   VITRECTOMY Left 10/03/2019   Dr. Elner, Vitrectomy, Focal Laser, Removal of Silicone Oil     SOCIAL HISTORY:  Social History   Socioeconomic History   Marital status: Divorced    Spouse name: Not on file   Number of children: 1   Years of education: Not on file   Highest education level: Not on file  Occupational History   Occupation: Retired  Tobacco Use   Smoking status: Former    Current packs/day: 0.00    Types: Cigarettes    Quit date: 12/28/2015    Years since quitting: 8.2    Passive exposure: Current   Smokeless tobacco: Never   Tobacco comments:    Patient quit within the last 10 years  Vaping Use   Vaping status: Never Used  Substance and Sexual Activity   Alcohol use: Not Currently   Drug use: Never   Sexual activity: Not Currently  Other Topics Concern   Not on file  Social History Narrative   ** Merged History Encounter **       Social Drivers of Health   Tobacco Use: Medium Risk (03/20/2024)   Patient History    Smoking Tobacco Use: Former    Smokeless  Tobacco Use: Never    Passive Exposure: Current  Financial Resource Strain: Low Risk (11/26/2022)   Overall Financial Resource Strain (CARDIA)    Difficulty of Paying Living Expenses: Not hard at all  Food Insecurity: No Food Insecurity (02/27/2024)   Epic    Worried About Programme Researcher, Broadcasting/film/video in the Last Year: Never true    Ran Out of Food in the Last Year: Never true  Transportation Needs: No Transportation Needs (02/27/2024)   Epic    Lack of Transportation (Medical): No    Lack of Transportation (Non-Medical): No  Physical Activity: Not on file  Stress: No Stress Concern Present (11/26/2022)   [employer] of Occupational Health - Occupational Stress Questionnaire    Feeling of Stress : Not at all  Social Connections: Moderately Integrated (02/22/2024)   Social Connection and Isolation Panel    Frequency of Communication with Friends and Family: More than three times  a week    Frequency of Social Gatherings with Friends and Family: More than three times a week    Attends Religious Services: 1 to 4 times per year    Active Member of [employer] or Organizations: Yes    Attends Banker Meetings: 1 to 4 times per year    Marital Status: Divorced  Intimate Partner Violence: Not At Risk (02/27/2024)   Epic    Fear of Current or Ex-Partner: No    Emotionally Abused: No    Physically Abused: No    Sexually Abused: No  Depression (PHQ2-9): Low Risk (03/21/2024)   Depression (PHQ2-9)    PHQ-2 Score: 0  Alcohol Screen: Not on file  Housing: Unknown (02/27/2024)   Epic    Unable to Pay for Housing in the Last Year: No    Number of Times Moved in the Last Year: Not on file    Homeless in the Last Year: No  Utilities: Not At Risk (02/27/2024)   Epic    Threatened with loss of utilities: No  Health Literacy: Adequate Health Literacy (11/26/2022)   B1300 Health Literacy    Frequency of need for help with medical instructions: Never    FAMILY HISTORY:  Family History  Problem Relation Age of Onset   Heart disease Mother    COPD Father    Cancer Sister        unknown primary   Cancer Brother        unknown primary   Cancer Brother    Hypertension Son    Colon cancer Neg Hx     CURRENT MEDICATIONS:  Outpatient Encounter Medications as of 03/21/2024  Medication Sig   acetaminophen  (TYLENOL ) 500 MG tablet Take 1,000 mg by mouth every 8 (eight) hours as needed for mild pain or moderate pain.   albuterol  (PROVENTIL ) (2.5 MG/3ML) 0.083% nebulizer solution Take 2.5 mg by nebulization every 6 (six) hours as needed for shortness of breath or wheezing.   albuterol  (VENTOLIN  HFA) 108 (90 Base) MCG/ACT inhaler Inhale 2 puffs into the lungs every 4 (four) hours as needed for wheezing or shortness of breath.   amLODipine  (NORVASC ) 5 MG tablet Take 5 mg by mouth daily.   apixaban  (ELIQUIS ) 2.5 MG TABS tablet Take 1 tablet (2.5 mg total) by mouth 2 (two)  times daily.   levothyroxine  (SYNTHROID ) 75 MCG tablet Take 75 mcg by mouth daily before breakfast.   losartan (COZAAR) 25 MG tablet Take 25 mg by mouth at bedtime.   lubiprostone  (AMITIZA ) 24 MCG capsule Take 1  capsule (24 mcg total) by mouth 2 (two) times daily with a meal.   nitroGLYCERIN  (NITROSTAT ) 0.4 MG SL tablet Place 1 tablet (0.4 mg total) under the tongue every 5 (five) minutes as needed for chest pain.   ondansetron  (ZOFRAN -ODT) 4 MG disintegrating tablet Take 4 mg by mouth 2 (two) times daily.   pantoprazole  (PROTONIX ) 40 MG tablet Take 1 tablet (40 mg total) by mouth 2 (two) times daily.   rosuvastatin  (CRESTOR ) 20 MG tablet Take 20 mg by mouth daily.   sertraline  (ZOLOFT ) 25 MG tablet Take 25 mg by mouth daily.   timolol  (TIMOPTIC ) 0.5 % ophthalmic solution Place 1 drop into the right eye 2 (two) times daily.   traZODone  (DESYREL ) 50 MG tablet Take 50 mg by mouth at bedtime.   TRELEGY ELLIPTA 200-62.5-25 MCG/ACT AEPB Inhale 1 puff into the lungs daily.   [DISCONTINUED] ferrous sulfate  324 MG TBEC Take 324 mg by mouth daily with breakfast.   erythromycin ophthalmic ointment Apply to eye. (Patient not taking: Reported on 03/21/2024)   ferrous sulfate  324 MG TBEC Take 1 tablet (324 mg total) by mouth every other day.   No facility-administered encounter medications on file as of 03/21/2024.    ALLERGIES:  Allergies  Allergen Reactions   Amoxicillin -Pot Clavulanate Diarrhea   Povidone-Iodine Other (See Comments)    Burning   Sulfa Antibiotics Other (See Comments)    Unknown    Sulfamethoxazole Other (See Comments)    Unknown      PHYSICAL EXAM:  ECOG PERFORMANCE STATUS: 1 - Symptomatic but completely ambulatory    Vitals:   03/21/24 1321  BP: (!) 90/50  Pulse: (!) 55  Resp: 17  Temp: (!) 97.1 F (36.2 C)  SpO2: 100%    Filed Weights   03/21/24 1321  Weight: 131 lb (59.4 kg)      Physical Exam Constitutional:      Appearance: Normal appearance.  Eyes:      Comments: Right eye swollen with erythema.  Cardiovascular:     Rate and Rhythm: Normal rate and regular rhythm.  Pulmonary:     Effort: Pulmonary effort is normal.     Breath sounds: Normal breath sounds.  Abdominal:     General: Bowel sounds are normal.     Palpations: Abdomen is soft.  Musculoskeletal:        General: No swelling. Normal range of motion.  Neurological:     Mental Status: She is alert and oriented to person, place, and time. Mental status is at baseline.      LABORATORY DATA:  I have reviewed the labs as listed.  CBC    Component Value Date/Time   WBC 4.7 03/14/2024 1401   RBC 3.49 (L) 03/14/2024 1401   HGB 11.3 (L) 03/14/2024 1401   HCT 35.7 (L) 03/14/2024 1401   PLT 102 (L) 03/14/2024 1401   MCV 102.3 (H) 03/14/2024 1401   MCH 32.4 03/14/2024 1401   MCHC 31.7 03/14/2024 1401   RDW 17.5 (H) 03/14/2024 1401   LYMPHSABS 0.3 (L) 02/22/2024 1250   MONOABS 0.7 02/22/2024 1250   EOSABS 0.0 02/22/2024 1250   BASOSABS 0.0 02/22/2024 1250      Latest Ref Rng & Units 03/14/2024    2:01 PM 02/25/2024    4:15 AM 02/24/2024    4:44 AM  CMP  Glucose 70 - 99 mg/dL 84  80  890   BUN 8 - 23 mg/dL 23  19  29    Creatinine 0.44 -  1.00 mg/dL 8.67  8.70  8.51   Sodium 135 - 145 mmol/L 140  142  140   Potassium 3.5 - 5.1 mmol/L 4.6  4.0  3.9   Chloride 98 - 111 mmol/L 109  109  109   CO2 22 - 32 mmol/L 20  24  22    Calcium  8.9 - 10.3 mg/dL 9.2  8.9  8.7   Total Protein 6.5 - 8.1 g/dL 7.0     Total Bilirubin 0.0 - 1.2 mg/dL 0.4     Alkaline Phos 38 - 126 U/L 70     AST 15 - 41 U/L 50     ALT 0 - 44 U/L 23       DIAGNOSTIC IMAGING:  I have independently reviewed the relevant imaging and discussed with the patient.  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 No signs of infection temperature and oxygen  saturations. We discussed reaching out to pulmonology to discuss refilling her  Trelegy. Chest x-ray ordered stat just in case symptoms worsen over the next couple of days. Iron  deficiency anemia due to chronic blood loss Onset of anemia noted on 05/19/2021, with Hgb 8.0/MCV 101.4.  Prior to this, she had Hgb 12.1 in July 2022.  Labs sent by PCP (05/11/2021): Hgb 9.2/MCV 96.  Creatinine was at baseline 1.38. Record review does show severe anemia in December 2017/January 2018 with Hgb 6.5, which was due to iron  deficiency of unknown source.  She received PRBC x2. 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  B12/methylmalonic acid, folate normal.  Normal SPEP/immunofixation.  DAT, haptoglobin, reticulocytes normal.  CMP showed baseline CKD stage IIIa/b She was Hemoccult negative in January 2018.  Most recent Hemoccult stool negative x2. She takes Eliquis  for atrial fibrillation She has been taking oral iron  supplements with only mild GI discomfort. She has occasional bright red blood in the toilet after straining for bowel movements.  No melena or epistaxis.  She has some chronic residual fatigue, but otherwise improved after IV iron  Reviewed most recent lab work which shows improvement of her iron  levels. No additional IV iron  needed at this time. Recommend reducing iron  tablets to every other day.  New prescription sent.   All questions were answered. The patient knows to call the clinic with any problems, questions or concerns.  Medical decision making: Moderate  Time spent on visit: I spent 20 minutes counseling the patient face to face. The total time spent in the appointment was 30 minutes and more than 50% was on counseling.  Delon Hope, AGNP-C Department of Hematology/Oncology [facility]  Phone: [phone]   03/21/2024 3:31 PM

## 2024-03-23 NOTE — Progress Notes (Signed)
" °  Ms. Chirico perioperative risk of a major cardiac event is 0.9% according to the Revised Cardiac Risk Index (RCRI).  Therefore, she is at low risk for perioperative complications.   Her functional capacity is excellent at 5.07 METs according to the Duke Activity Status Index (DASI). Recommendations: According to ACC/AHA guidelines, no further cardiovascular testing needed.  The patient may proceed to surgery at acceptable risk.   Antiplatelet and/or Anticoagulation Recommendations: Eliquis  (Apixaban ) can be held for  2 days prior to surgery.  Please resume post op when felt to be safe.    Almarie Crate, NP  "

## 2024-03-23 NOTE — Telephone Encounter (Signed)
 Caller Jenna) wants a copy of patient's last OV notes faxed to fax#  [phone]

## 2024-03-28 NOTE — Telephone Encounter (Signed)
" °  ° °  Primary Cardiologist: Alvan Carrier, MD  Chart reviewed as part of pre-operative protocol coverage. Given past medical history and time since last visit, based on ACC/AHA guidelines, Patricia Jackson would be at acceptable risk for the planned procedure without further cardiovascular testing.   Patient with diagnosis of atrial fibrillation on Eliquis  for anticoagulation.     Procedure:  Dental Extraction - Amount of Teeth to be Pulled:  5 total   Date of Surgery:  Clearance TBD        CHA2DS2-VASc Score = 5   This indicates a 7.2% annual risk of stroke. The patient's score is based upon: CHF History: 0 HTN History: 1 Diabetes History: 0 Stroke History: 0 Vascular Disease History: 1 Age Score: 2 Gender Score: 1     CrCl 39 Platelet count 84   Patient has not had an Afib/aflutter ablation in the last 3 months, DCCV within the last 4 weeks or a watchman implanted in the last 45 days    Patient does not require pre-op antibiotics for dental procedure.   Per office protocol, patient can hold Eliquis  for 2 days prior to procedure.   Patient will not need bridging with Lovenox  (enoxaparin ) around procedure.  I will route this recommendation to the requesting party via Epic fax function and remove from pre-op pool.  Please call with questions.  Patricia HERO. Raynesha Tiedt NP-C     03/28/2024, 9:34 AM [facility] [address] [place], [place] [address] Office [phone]      "

## 2024-04-02 ENCOUNTER — Telehealth: Payer: Self-pay | Admitting: Nurse Practitioner

## 2024-04-02 ENCOUNTER — Encounter: Payer: Self-pay | Admitting: *Deleted

## 2024-04-02 NOTE — Telephone Encounter (Signed)
 [facility] is requesting a copy of the patients OV notes from the 03/20/24 visit. She gave fax number [phone]

## 2024-04-11 ENCOUNTER — Other Ambulatory Visit: Payer: Self-pay | Admitting: Gastroenterology

## 2024-04-19 ENCOUNTER — Other Ambulatory Visit (HOSPITAL_COMMUNITY): Payer: Self-pay | Admitting: Family Medicine

## 2024-04-19 DIAGNOSIS — Z780 Asymptomatic menopausal state: Secondary | ICD-10-CM

## 2024-04-30 ENCOUNTER — Other Ambulatory Visit (HOSPITAL_COMMUNITY)

## 2024-05-11 ENCOUNTER — Other Ambulatory Visit (HOSPITAL_COMMUNITY)

## 2024-05-18 ENCOUNTER — Other Ambulatory Visit (HOSPITAL_COMMUNITY)

## 2024-06-18 ENCOUNTER — Ambulatory Visit: Admitting: Nurse Practitioner

## 2024-06-26 ENCOUNTER — Inpatient Hospital Stay

## 2024-07-03 ENCOUNTER — Inpatient Hospital Stay: Admitting: Oncology

## 2024-10-04 ENCOUNTER — Ambulatory Visit: Admitting: Rheumatology
